# Patient Record
Sex: Male | Born: 1955
Health system: Southern US, Community
[De-identification: ages and names within clinical notes are randomized; demographics above are authoritative.]

## PROBLEM LIST (undated history)

## (undated) DIAGNOSIS — I639 Cerebral infarction, unspecified: Secondary | ICD-10-CM

## (undated) DIAGNOSIS — R7303 Prediabetes: Secondary | ICD-10-CM

## (undated) DIAGNOSIS — R109 Unspecified abdominal pain: Secondary | ICD-10-CM

## (undated) DIAGNOSIS — K219 Gastro-esophageal reflux disease without esophagitis: Secondary | ICD-10-CM

## (undated) DIAGNOSIS — J189 Pneumonia, unspecified organism: Secondary | ICD-10-CM

## (undated) DIAGNOSIS — M1990 Unspecified osteoarthritis, unspecified site: Secondary | ICD-10-CM

## (undated) DIAGNOSIS — R1013 Epigastric pain: Secondary | ICD-10-CM

## (undated) DIAGNOSIS — I10 Essential (primary) hypertension: Secondary | ICD-10-CM

## (undated) DIAGNOSIS — D369 Benign neoplasm, unspecified site: Secondary | ICD-10-CM

## (undated) DIAGNOSIS — E119 Type 2 diabetes mellitus without complications: Secondary | ICD-10-CM

## (undated) DIAGNOSIS — R519 Headache, unspecified: Secondary | ICD-10-CM

## (undated) DIAGNOSIS — J449 Chronic obstructive pulmonary disease, unspecified: Secondary | ICD-10-CM

## (undated) DIAGNOSIS — R51 Headache: Secondary | ICD-10-CM

## (undated) DIAGNOSIS — R079 Chest pain, unspecified: Secondary | ICD-10-CM

## (undated) DIAGNOSIS — Z9289 Personal history of other medical treatment: Secondary | ICD-10-CM

## (undated) HISTORY — DX: Chronic obstructive pulmonary disease, unspecified: J44.9

## (undated) HISTORY — PX: BACK SURGERY: SHX140

## (undated) HISTORY — DX: Epigastric pain: R10.13

## (undated) HISTORY — DX: Unspecified abdominal pain: R10.9

## (undated) HISTORY — DX: Chest pain, unspecified: R07.9

## (undated) HISTORY — DX: Personal history of other medical treatment: Z92.89

---

## 2001-01-31 ENCOUNTER — Emergency Department (HOSPITAL_COMMUNITY): Admission: EM | Admit: 2001-01-31 | Discharge: 2001-01-31 | Payer: Self-pay | Admitting: *Deleted

## 2001-01-31 ENCOUNTER — Encounter: Payer: Self-pay | Admitting: *Deleted

## 2001-05-07 ENCOUNTER — Emergency Department (HOSPITAL_COMMUNITY): Admission: EM | Admit: 2001-05-07 | Discharge: 2001-05-07 | Payer: Self-pay | Admitting: *Deleted

## 2002-01-04 ENCOUNTER — Emergency Department (HOSPITAL_COMMUNITY): Admission: EM | Admit: 2002-01-04 | Discharge: 2002-01-04 | Payer: Self-pay | Admitting: Emergency Medicine

## 2002-05-19 ENCOUNTER — Emergency Department (HOSPITAL_COMMUNITY): Admission: EM | Admit: 2002-05-19 | Discharge: 2002-05-20 | Payer: Self-pay | Admitting: Internal Medicine

## 2002-05-29 ENCOUNTER — Emergency Department (HOSPITAL_COMMUNITY): Admission: EM | Admit: 2002-05-29 | Discharge: 2002-05-29 | Payer: Self-pay | Admitting: Emergency Medicine

## 2002-06-03 ENCOUNTER — Ambulatory Visit (HOSPITAL_COMMUNITY): Admission: RE | Admit: 2002-06-03 | Discharge: 2002-06-03 | Payer: Self-pay | Admitting: Emergency Medicine

## 2002-06-03 ENCOUNTER — Encounter: Payer: Self-pay | Admitting: Emergency Medicine

## 2004-01-18 ENCOUNTER — Emergency Department (HOSPITAL_COMMUNITY): Admission: EM | Admit: 2004-01-18 | Discharge: 2004-01-18 | Payer: Self-pay | Admitting: Emergency Medicine

## 2004-02-17 ENCOUNTER — Emergency Department (HOSPITAL_COMMUNITY): Admission: EM | Admit: 2004-02-17 | Discharge: 2004-02-17 | Payer: Self-pay | Admitting: Emergency Medicine

## 2004-02-25 ENCOUNTER — Ambulatory Visit (HOSPITAL_COMMUNITY): Admission: RE | Admit: 2004-02-25 | Discharge: 2004-02-25 | Payer: Self-pay | Admitting: Orthopaedic Surgery

## 2004-03-11 ENCOUNTER — Encounter (HOSPITAL_COMMUNITY): Admission: RE | Admit: 2004-03-11 | Discharge: 2004-04-10 | Payer: Self-pay | Admitting: Orthopaedic Surgery

## 2004-04-08 ENCOUNTER — Ambulatory Visit (HOSPITAL_COMMUNITY): Admission: RE | Admit: 2004-04-08 | Discharge: 2004-04-09 | Payer: Self-pay | Admitting: Neurosurgery

## 2004-06-07 ENCOUNTER — Encounter (HOSPITAL_COMMUNITY): Admission: RE | Admit: 2004-06-07 | Discharge: 2004-06-11 | Payer: Self-pay | Admitting: Neurosurgery

## 2004-06-15 ENCOUNTER — Emergency Department (HOSPITAL_COMMUNITY): Admission: EM | Admit: 2004-06-15 | Discharge: 2004-06-15 | Payer: Self-pay | Admitting: Emergency Medicine

## 2004-06-22 ENCOUNTER — Ambulatory Visit (HOSPITAL_COMMUNITY): Admission: RE | Admit: 2004-06-22 | Discharge: 2004-06-22 | Payer: Self-pay | Admitting: Neurosurgery

## 2004-07-19 ENCOUNTER — Encounter: Admission: RE | Admit: 2004-07-19 | Discharge: 2004-07-19 | Payer: Self-pay | Admitting: Neurosurgery

## 2004-08-02 ENCOUNTER — Encounter (HOSPITAL_COMMUNITY): Admission: RE | Admit: 2004-08-02 | Discharge: 2004-09-01 | Payer: Self-pay | Admitting: Neurosurgery

## 2004-08-18 ENCOUNTER — Emergency Department (HOSPITAL_COMMUNITY): Admission: EM | Admit: 2004-08-18 | Discharge: 2004-08-18 | Payer: Self-pay | Admitting: Emergency Medicine

## 2004-09-12 HISTORY — PX: ESOPHAGOGASTRODUODENOSCOPY: SHX1529

## 2004-09-29 ENCOUNTER — Ambulatory Visit: Payer: Self-pay | Admitting: Internal Medicine

## 2004-09-30 ENCOUNTER — Ambulatory Visit: Payer: Self-pay | Admitting: Internal Medicine

## 2004-09-30 ENCOUNTER — Ambulatory Visit (HOSPITAL_COMMUNITY): Admission: RE | Admit: 2004-09-30 | Discharge: 2004-09-30 | Payer: Self-pay | Admitting: Internal Medicine

## 2004-11-30 ENCOUNTER — Ambulatory Visit: Payer: Self-pay | Admitting: Internal Medicine

## 2005-01-11 ENCOUNTER — Emergency Department (HOSPITAL_COMMUNITY): Admission: EM | Admit: 2005-01-11 | Discharge: 2005-01-11 | Payer: Self-pay | Admitting: Emergency Medicine

## 2005-06-16 ENCOUNTER — Ambulatory Visit (HOSPITAL_COMMUNITY): Admission: RE | Admit: 2005-06-16 | Discharge: 2005-06-16 | Payer: Self-pay | Admitting: Internal Medicine

## 2006-02-16 ENCOUNTER — Ambulatory Visit (HOSPITAL_COMMUNITY): Admission: RE | Admit: 2006-02-16 | Discharge: 2006-02-16 | Payer: Self-pay | Admitting: Internal Medicine

## 2006-03-22 ENCOUNTER — Emergency Department (HOSPITAL_COMMUNITY): Admission: EM | Admit: 2006-03-22 | Discharge: 2006-03-22 | Payer: Self-pay | Admitting: Emergency Medicine

## 2006-04-26 ENCOUNTER — Emergency Department (HOSPITAL_COMMUNITY): Admission: EM | Admit: 2006-04-26 | Discharge: 2006-04-26 | Payer: Self-pay | Admitting: Emergency Medicine

## 2006-10-08 ENCOUNTER — Emergency Department (HOSPITAL_COMMUNITY): Admission: EM | Admit: 2006-10-08 | Discharge: 2006-10-08 | Payer: Self-pay | Admitting: Emergency Medicine

## 2007-02-17 ENCOUNTER — Emergency Department (HOSPITAL_COMMUNITY): Admission: EM | Admit: 2007-02-17 | Discharge: 2007-02-17 | Payer: Self-pay | Admitting: Emergency Medicine

## 2007-04-12 ENCOUNTER — Ambulatory Visit: Payer: Self-pay | Admitting: Orthopedic Surgery

## 2007-05-02 ENCOUNTER — Encounter: Payer: Self-pay | Admitting: Orthopedic Surgery

## 2007-05-21 ENCOUNTER — Ambulatory Visit: Payer: Self-pay | Admitting: Orthopedic Surgery

## 2007-05-29 ENCOUNTER — Encounter (HOSPITAL_COMMUNITY): Admission: RE | Admit: 2007-05-29 | Discharge: 2007-06-12 | Payer: Self-pay | Admitting: Orthopedic Surgery

## 2007-06-15 ENCOUNTER — Encounter (HOSPITAL_COMMUNITY): Admission: RE | Admit: 2007-06-15 | Discharge: 2007-07-15 | Payer: Self-pay | Admitting: Orthopedic Surgery

## 2007-07-03 ENCOUNTER — Ambulatory Visit: Payer: Self-pay | Admitting: Orthopedic Surgery

## 2007-07-03 DIAGNOSIS — M47812 Spondylosis without myelopathy or radiculopathy, cervical region: Secondary | ICD-10-CM | POA: Insufficient documentation

## 2007-07-03 DIAGNOSIS — M542 Cervicalgia: Secondary | ICD-10-CM

## 2007-07-05 ENCOUNTER — Encounter: Payer: Self-pay | Admitting: Orthopedic Surgery

## 2007-07-10 ENCOUNTER — Emergency Department (HOSPITAL_COMMUNITY): Admission: EM | Admit: 2007-07-10 | Discharge: 2007-07-10 | Payer: Self-pay | Admitting: Emergency Medicine

## 2007-10-02 ENCOUNTER — Ambulatory Visit: Payer: Self-pay | Admitting: Orthopedic Surgery

## 2007-10-12 ENCOUNTER — Telehealth: Payer: Self-pay | Admitting: Orthopedic Surgery

## 2007-10-14 HISTORY — PX: NECK SURGERY: SHX720

## 2007-10-15 ENCOUNTER — Encounter: Payer: Self-pay | Admitting: Orthopedic Surgery

## 2007-10-16 ENCOUNTER — Inpatient Hospital Stay (HOSPITAL_COMMUNITY): Admission: RE | Admit: 2007-10-16 | Discharge: 2007-10-18 | Payer: Self-pay | Admitting: Neurosurgery

## 2007-11-07 ENCOUNTER — Encounter: Payer: Self-pay | Admitting: Orthopedic Surgery

## 2007-12-05 ENCOUNTER — Encounter: Payer: Self-pay | Admitting: Orthopedic Surgery

## 2007-12-17 ENCOUNTER — Encounter: Admission: RE | Admit: 2007-12-17 | Discharge: 2007-12-17 | Payer: Self-pay | Admitting: Neurosurgery

## 2007-12-31 ENCOUNTER — Encounter: Payer: Self-pay | Admitting: Orthopedic Surgery

## 2007-12-31 ENCOUNTER — Telehealth: Payer: Self-pay | Admitting: Orthopedic Surgery

## 2008-01-11 ENCOUNTER — Encounter: Payer: Self-pay | Admitting: Orthopedic Surgery

## 2008-01-17 ENCOUNTER — Observation Stay (HOSPITAL_COMMUNITY): Admission: RE | Admit: 2008-01-17 | Discharge: 2008-01-18 | Payer: Self-pay | Admitting: Neurosurgery

## 2008-01-24 ENCOUNTER — Encounter: Payer: Self-pay | Admitting: Orthopedic Surgery

## 2008-01-27 ENCOUNTER — Encounter: Admission: RE | Admit: 2008-01-27 | Discharge: 2008-01-27 | Payer: Self-pay | Admitting: Neurosurgery

## 2008-01-31 ENCOUNTER — Inpatient Hospital Stay (HOSPITAL_COMMUNITY): Admission: RE | Admit: 2008-01-31 | Discharge: 2008-02-02 | Payer: Self-pay | Admitting: Neurosurgery

## 2008-02-19 ENCOUNTER — Emergency Department (HOSPITAL_COMMUNITY): Admission: EM | Admit: 2008-02-19 | Discharge: 2008-02-19 | Payer: Self-pay | Admitting: Emergency Medicine

## 2008-03-05 ENCOUNTER — Telehealth: Payer: Self-pay | Admitting: Orthopedic Surgery

## 2008-03-05 ENCOUNTER — Encounter: Payer: Self-pay | Admitting: Orthopedic Surgery

## 2008-03-26 ENCOUNTER — Encounter: Payer: Self-pay | Admitting: Orthopedic Surgery

## 2008-05-02 ENCOUNTER — Encounter: Payer: Self-pay | Admitting: Orthopedic Surgery

## 2008-08-01 ENCOUNTER — Encounter: Payer: Self-pay | Admitting: Orthopedic Surgery

## 2008-10-10 ENCOUNTER — Ambulatory Visit (HOSPITAL_COMMUNITY): Admission: RE | Admit: 2008-10-10 | Discharge: 2008-10-10 | Payer: Self-pay | Admitting: Internal Medicine

## 2008-11-14 ENCOUNTER — Emergency Department (HOSPITAL_COMMUNITY): Admission: EM | Admit: 2008-11-14 | Discharge: 2008-11-14 | Payer: Self-pay | Admitting: Emergency Medicine

## 2009-01-08 ENCOUNTER — Emergency Department (HOSPITAL_COMMUNITY): Admission: EM | Admit: 2009-01-08 | Discharge: 2009-01-08 | Payer: Self-pay | Admitting: Emergency Medicine

## 2009-01-11 ENCOUNTER — Emergency Department (HOSPITAL_COMMUNITY): Admission: EM | Admit: 2009-01-11 | Discharge: 2009-01-11 | Payer: Self-pay | Admitting: Emergency Medicine

## 2009-02-03 ENCOUNTER — Encounter: Payer: Self-pay | Admitting: Orthopedic Surgery

## 2009-04-11 ENCOUNTER — Emergency Department (HOSPITAL_COMMUNITY): Admission: EM | Admit: 2009-04-11 | Discharge: 2009-04-11 | Payer: Self-pay | Admitting: Emergency Medicine

## 2009-04-15 ENCOUNTER — Encounter (INDEPENDENT_AMBULATORY_CARE_PROVIDER_SITE_OTHER): Payer: Self-pay | Admitting: *Deleted

## 2009-04-16 ENCOUNTER — Encounter (INDEPENDENT_AMBULATORY_CARE_PROVIDER_SITE_OTHER): Payer: Self-pay | Admitting: *Deleted

## 2009-05-11 DIAGNOSIS — K219 Gastro-esophageal reflux disease without esophagitis: Secondary | ICD-10-CM

## 2009-05-11 DIAGNOSIS — F1010 Alcohol abuse, uncomplicated: Secondary | ICD-10-CM | POA: Insufficient documentation

## 2009-05-11 DIAGNOSIS — F17200 Nicotine dependence, unspecified, uncomplicated: Secondary | ICD-10-CM

## 2009-05-11 DIAGNOSIS — R197 Diarrhea, unspecified: Secondary | ICD-10-CM

## 2009-05-12 ENCOUNTER — Ambulatory Visit: Payer: Self-pay | Admitting: Internal Medicine

## 2009-05-12 DIAGNOSIS — R1032 Left lower quadrant pain: Secondary | ICD-10-CM

## 2009-05-12 DIAGNOSIS — K5289 Other specified noninfective gastroenteritis and colitis: Secondary | ICD-10-CM

## 2009-05-12 DIAGNOSIS — Z8711 Personal history of peptic ulcer disease: Secondary | ICD-10-CM

## 2009-05-22 ENCOUNTER — Ambulatory Visit: Payer: Self-pay | Admitting: Internal Medicine

## 2009-05-22 ENCOUNTER — Ambulatory Visit (HOSPITAL_COMMUNITY): Admission: RE | Admit: 2009-05-22 | Discharge: 2009-05-22 | Payer: Self-pay | Admitting: Internal Medicine

## 2009-05-22 ENCOUNTER — Encounter: Payer: Self-pay | Admitting: Internal Medicine

## 2009-05-26 ENCOUNTER — Encounter: Payer: Self-pay | Admitting: Internal Medicine

## 2009-10-20 ENCOUNTER — Emergency Department (HOSPITAL_COMMUNITY): Admission: EM | Admit: 2009-10-20 | Discharge: 2009-10-20 | Payer: Self-pay | Admitting: Emergency Medicine

## 2009-12-08 ENCOUNTER — Emergency Department (HOSPITAL_COMMUNITY): Admission: EM | Admit: 2009-12-08 | Discharge: 2009-12-08 | Payer: Self-pay | Admitting: Emergency Medicine

## 2010-02-15 ENCOUNTER — Emergency Department (HOSPITAL_COMMUNITY): Admission: EM | Admit: 2010-02-15 | Discharge: 2010-02-15 | Payer: Self-pay | Admitting: Emergency Medicine

## 2010-04-18 ENCOUNTER — Emergency Department (HOSPITAL_COMMUNITY): Admission: EM | Admit: 2010-04-18 | Discharge: 2010-04-18 | Payer: Self-pay | Admitting: Emergency Medicine

## 2010-06-10 ENCOUNTER — Inpatient Hospital Stay (HOSPITAL_COMMUNITY): Admission: EM | Admit: 2010-06-10 | Discharge: 2010-06-11 | Payer: Self-pay | Admitting: Emergency Medicine

## 2010-10-09 ENCOUNTER — Emergency Department (HOSPITAL_COMMUNITY)
Admission: EM | Admit: 2010-10-09 | Discharge: 2010-10-09 | Payer: Self-pay | Source: Home / Self Care | Admitting: Emergency Medicine

## 2010-10-09 LAB — DIFFERENTIAL
Eosinophils Absolute: 0 10*3/uL (ref 0.0–0.7)
Eosinophils Relative: 0 % (ref 0–5)
Monocytes Absolute: 0.8 10*3/uL (ref 0.1–1.0)
Monocytes Relative: 8 % (ref 3–12)
Neutro Abs: 6.2 10*3/uL (ref 1.7–7.7)

## 2010-10-09 LAB — URINALYSIS, ROUTINE W REFLEX MICROSCOPIC
Leukocytes, UA: NEGATIVE
Nitrite: NEGATIVE
Specific Gravity, Urine: >1.03 — ABNORMAL HIGH (ref 1.005–1.030)

## 2010-10-09 LAB — CBC
HCT: 49.5 % (ref 39.0–52.0)
Hemoglobin: 18.7 g/dL — ABNORMAL HIGH (ref 13.0–17.0)
MCHC: 37.8 g/dL — ABNORMAL HIGH (ref 30.0–36.0)
MCV: 86.1 fL (ref 78.0–100.0)
RBC: 5.75 MIL/uL (ref 4.22–5.81)
RDW: 13.6 % (ref 11.5–15.5)

## 2010-10-09 LAB — COMPREHENSIVE METABOLIC PANEL
ALT: 25 U/L (ref 0–53)
Alkaline Phosphatase: 122 U/L — ABNORMAL HIGH (ref 39–117)
BUN: 18 mg/dL (ref 6–23)
Creatinine, Ser: 1.17 mg/dL (ref 0.4–1.5)
Sodium: 133 mEq/L — ABNORMAL LOW (ref 135–145)
Total Bilirubin: 0.4 mg/dL (ref 0.3–1.2)

## 2010-10-09 LAB — URINE MICROSCOPIC-ADD ON

## 2010-10-13 HISTORY — PX: COLONOSCOPY: SHX174

## 2010-10-22 ENCOUNTER — Encounter: Payer: Self-pay | Admitting: Internal Medicine

## 2010-10-27 ENCOUNTER — Ambulatory Visit (HOSPITAL_COMMUNITY)
Admission: RE | Admit: 2010-10-27 | Discharge: 2010-10-27 | Disposition: A | Discharge status: Home / Self Care | Payer: Medicare Other | Source: Ambulatory Visit | Attending: Internal Medicine | Admitting: Internal Medicine

## 2010-10-27 ENCOUNTER — Encounter: Payer: Medicare Other | Admitting: Internal Medicine

## 2010-10-27 DIAGNOSIS — Z860100 Personal history of colon polyps, unspecified: Secondary | ICD-10-CM | POA: Insufficient documentation

## 2010-10-27 DIAGNOSIS — D126 Benign neoplasm of colon, unspecified: Secondary | ICD-10-CM | POA: Insufficient documentation

## 2010-10-27 DIAGNOSIS — Z09 Encounter for follow-up examination after completed treatment for conditions other than malignant neoplasm: Secondary | ICD-10-CM

## 2010-10-28 NOTE — Letter (Signed)
Summary: [facility] TRIAGE  [facility] TRIAGE   Imported By: Hoy Morn 10/22/2010 11:54:56  _____________________________________________________________________  External Attachment:    Type:   Image     Comment:   External Document  Appended Document: [facility] TRIAGE ok as is  Appended Document: [facility] TRIAGE MAILED INSTRUCTIONS TO PATIENT

## 2010-11-02 NOTE — Op Note (Signed)
  [name], [name]                   ACCOUNT NO.:  [id number]  MEDICAL RECORD NO.:  [id number]           PATIENT TYPE:  O  LOCATION:  DAYP                          FACILITY:  [facility]  PHYSICIAN:  R. Garfield Cornea, M.D. DATE OF BIRTH:  November 21, 1955  DATE OF PROCEDURE:  10/27/2010 DATE OF DISCHARGE:                              OPERATIVE REPORT   PROCEDURE:  Colonoscopy with snare polypectomy.  INDICATIONS FOR PROCEDURE:  Patient is a 55 year old gentleman who had multiple polyps removed from colon in 2002.  They were adenomatous and tubulovillous polyps.  He has no lower GI tract symptoms.  Currently, he now comes for surveillance examination.  Risks, benefits, alternatives, limitations, imponderables have been reviewed.  Please see the documentation in the medical record.  PROCEDURE NOTE:  O2 saturation, blood pressure, pulse, respirations were monitored throughout the entire procedure.  CONSCIOUS SEDATION:  Versed 6 mg IV, Demerol 100 mg IV in divided doses.  INSTRUMENT:  Pentax video chip system.  FINDINGS:  Digital rectal exam revealed no abnormalities.  Endoscopic findings:  Prep was good.  Colon:  Colonic mucosa was surveyed from the rectosigmoid junction through the left transverse right colon to the appendiceal orifice, ileocecal valve/cecum.  These structures were well seen and photographed for record.  From this level, scope was slowly and cautiously withdrawn.  All previously mentioned mucosal surfaces were again seen.  The patient had a 4-mm polyp on a stalk just distal to the ileocecal valve, it was cold snared, however, it was not recovered after some search including straining and the suction of bowel.  The remainder of colonic mucosa appeared normal.  Scope was pulled down to the rectum where a thorough examination of rectal mucosa including retroflexed view of the anal verge demonstrated no abnormalities.  The patient tolerated the procedure very well.  Cecal  withdrawal time 9 minutes.  IMPRESSION:  Normal rectum, pedunculated polyp just distal ileocecal valve resected via snare technique, but not recovered.  Remainder of colonic mucosa appeared normal.  RECOMMENDATIONS:  Recommended [name] to return in 5 years for surveillance colonoscopy.     Bridgette Habermann, M.D.     RMR/MEDQ  D:  10/27/2010  T:  10/27/2010  Job:  [id number]  cc:   Tesfaye D. Legrand Rams, MD Fax: [phone]  Electronically Signed by Jannette Spanner M.D. on 11/02/2010 02:34:51 PM

## 2010-11-25 LAB — CBC
Hemoglobin: 16.4 g/dL (ref 13.0–17.0)
MCH: 31.3 pg (ref 26.0–34.0)
MCV: 92 fL (ref 78.0–100.0)
Platelets: 226 10*3/uL (ref 150–400)
RBC: 5.24 MIL/uL (ref 4.22–5.81)
WBC: 6.4 10*3/uL (ref 4.0–10.5)

## 2010-11-25 LAB — POCT CARDIAC MARKERS
CKMB, poc: <1 ng/mL — ABNORMAL LOW (ref 1.0–8.0)
Myoglobin, poc: 27.7 ng/mL (ref 12–200)
Troponin i, poc: <0.05 ng/mL (ref 0.00–0.09)

## 2010-11-25 LAB — DIFFERENTIAL
Eosinophils Absolute: 0.1 10*3/uL (ref 0.0–0.7)
Eosinophils Relative: 1 % (ref 0–5)
Lymphocytes Relative: 26 % (ref 12–46)
Lymphs Abs: 1.7 10*3/uL (ref 0.7–4.0)
Monocytes Relative: 8 % (ref 3–12)
Neutrophils Relative %: 63 % (ref 43–77)

## 2010-11-25 LAB — BRAIN NATRIURETIC PEPTIDE: Pro B Natriuretic peptide (BNP): <30 pg/mL (ref 0.0–100.0)

## 2010-11-25 LAB — BASIC METABOLIC PANEL
CO2: 24 mEq/L (ref 19–32)
Chloride: 111 mEq/L (ref 96–112)
Creatinine, Ser: 1.05 mg/dL (ref 0.4–1.5)
GFR calc Af Amer: >60 mL/min (ref >60)
Sodium: 138 mEq/L (ref 135–145)

## 2010-11-25 LAB — D-DIMER, QUANTITATIVE: D-Dimer, Quant: 0.28 ug/mL-FEU (ref 0.00–0.48)

## 2010-11-29 LAB — DIFFERENTIAL
Basophils Absolute: 0 10*3/uL (ref 0.0–0.1)
Eosinophils Relative: 1 % (ref 0–5)
Lymphocytes Relative: 22 % (ref 12–46)
Neutro Abs: 4.6 10*3/uL (ref 1.7–7.7)
Neutrophils Relative %: 67 % (ref 43–77)

## 2010-11-29 LAB — URINALYSIS, ROUTINE W REFLEX MICROSCOPIC
Bilirubin Urine: NEGATIVE
Glucose, UA: NEGATIVE mg/dL
Hgb urine dipstick: NEGATIVE
Specific Gravity, Urine: 1.005 (ref 1.005–1.030)

## 2010-11-29 LAB — COMPREHENSIVE METABOLIC PANEL
AST: 27 U/L (ref 0–37)
BUN: 7 mg/dL (ref 6–23)
CO2: 18 mEq/L — ABNORMAL LOW (ref 19–32)
Chloride: 110 mEq/L (ref 96–112)
Creatinine, Ser: 0.98 mg/dL (ref 0.4–1.5)
GFR calc non Af Amer: >60 mL/min (ref >60)
Glucose, Bld: 90 mg/dL (ref 70–99)
Total Bilirubin: 0.6 mg/dL (ref 0.3–1.2)

## 2010-11-29 LAB — CBC
HCT: 50.8 % (ref 39.0–52.0)
Hemoglobin: 17.2 g/dL — ABNORMAL HIGH (ref 13.0–17.0)
MCHC: 33.8 g/dL (ref 30.0–36.0)
MCV: 92.1 fL (ref 78.0–100.0)
RBC: 5.52 MIL/uL (ref 4.22–5.81)
WBC: 6.9 10*3/uL (ref 4.0–10.5)

## 2010-12-03 LAB — POCT CARDIAC MARKERS
CKMB, poc: 1.6 ng/mL (ref 1.0–8.0)
Troponin i, poc: <0.05 ng/mL (ref 0.00–0.09)

## 2010-12-03 LAB — BASIC METABOLIC PANEL
Calcium: 9.5 mg/dL (ref 8.4–10.5)
Chloride: 111 mEq/L (ref 96–112)
Creatinine, Ser: 1.64 mg/dL — ABNORMAL HIGH (ref 0.4–1.5)
GFR calc Af Amer: 53 mL/min — ABNORMAL LOW (ref >60)
Sodium: 140 mEq/L (ref 135–145)

## 2010-12-03 LAB — D-DIMER, QUANTITATIVE: D-Dimer, Quant: 0.31 ug/mL-FEU (ref 0.00–0.48)

## 2010-12-03 LAB — CBC
Hemoglobin: 18.9 g/dL — ABNORMAL HIGH (ref 13.0–17.0)
MCV: 89.8 fL (ref 78.0–100.0)
RBC: 6.03 MIL/uL — ABNORMAL HIGH (ref 4.22–5.81)
WBC: 16.5 10*3/uL — ABNORMAL HIGH (ref 4.0–10.5)

## 2010-12-03 LAB — DIFFERENTIAL
Lymphs Abs: 2.4 10*3/uL (ref 0.7–4.0)
Monocytes Relative: 8 % (ref 3–12)
Neutro Abs: 12.6 10*3/uL — ABNORMAL HIGH (ref 1.7–7.7)
Neutrophils Relative %: 77 % (ref 43–77)

## 2010-12-17 LAB — OVA AND PARASITE EXAMINATION: Ova and parasites: NONE SEEN

## 2010-12-17 LAB — STOOL CULTURE

## 2010-12-21 LAB — POCT I-STAT, CHEM 8
Glucose, Bld: 82 mg/dL (ref 70–99)
HCT: 50 % (ref 39.0–52.0)
Hemoglobin: 17 g/dL (ref 13.0–17.0)
Potassium: 4.1 mEq/L (ref 3.5–5.1)
Sodium: 139 mEq/L (ref 135–145)

## 2010-12-21 LAB — POCT CARDIAC MARKERS
CKMB, poc: 2.1 ng/mL (ref 1.0–8.0)
Myoglobin, poc: 83.6 ng/mL (ref 12–200)

## 2011-01-25 NOTE — Discharge Summary (Signed)
[name], [name]                   ACCOUNT NO.:  [id number]   MEDICAL RECORD NO.:  [id number]          PATIENT TYPE:  OIB   LOCATION:  3007                         FACILITY:  [facility]   PHYSICIAN:  Marchia Meiers. Vertell Limber, M.D.  DATE OF BIRTH:  09/04/1956   DATE OF ADMISSION:  01/31/2008  DATE OF DISCHARGE:  02/02/2008                               DISCHARGE SUMMARY   REASON FOR ADMISSION:  Recurrent disk herniation with facet fracture.   FINAL DIAGNOSIS:  Recurrent disk herniation with facet fracture.   HISTORY AND HOSPITAL COURSE:  Bryan [name] is a 55 year old male who had  undergone lumbar diskectomy.  He did well with the surgeries and had  recurrence of pain.  He was found to have very large recurrent disk  herniation.  He was taken back to the surgery where he was found to have  a fractured facet and underwent unilateral pedicle screw fixation at the  operated level with interbody grafting.  He tolerated the procedure well  and gradually mobilized, and was doing well on Feb 02, 2008 and was  discharged home in stable and satisfactory condition.   DISCHARGE MEDICATIONS:  Include Vicodin Extra Strength and Flexeril.  He  is instructed to follow up with Dr. Luiz Ochoa in 3 weeks in the office.      Marchia Meiers. Vertell Limber, M.D.  Electronically Signed     JDS/MEDQ  D:  02/02/2008  T:  02/02/2008  Job:  [id number]

## 2011-01-25 NOTE — Op Note (Signed)
NAMEUZZIAH, RIGG NO.:  [id number]   MEDICAL RECORD NO.:  [id number]          PATIENT TYPE:  OIB   LOCATION:  3007                         FACILITY:  [facility]   PHYSICIAN:  Otilio Connors, M.D.  DATE OF BIRTH:  [birth year]   DATE OF PROCEDURE:  01/31/2008  DATE OF DISCHARGE:                               OPERATIVE REPORT   PREOPERATIVE DIAGNOSIS:  Recurrent herniated nucleus pulposus stenosis,  L3-L4.   POSTOPERATIVE DIAGNOSES:  1. Recurrent herniated nucleus pulposus stenosis, L3-L4.  2. Left L3-L4 facet fracture.   PROCEDURES:  Redo decompressive laminectomy at L3 and L4 (two levels),  posterior lumbar interbody fusion, L3-L4; Saber interbody cage at L3-L4;  nonsegmented Expedium pedicle screw fixation at L3-L4; posterolateral  fusion, L3-L4; autograft same incision; INFUSE bone morphogenetic  protein; and microdissection with microscope.   SURGEON:  Otilio Connors, MD   ASSISTANT:  Hosie Spangle, MD   ANESTHESIA:  General endotracheal tube anesthesia.   ESTIMATED BLOOD LOSS:  Minimal.   BLOOD GIVEN:  None.   DRAINS:  None.   COMPLICATIONS:  None.   REASON FOR PROCEDURE:  The patient is a 55 year old gentleman who  underwent a decompressive laminectomy diskectomy at L3-L4 and L4-L5, 12  days ago.  He was doing great in resolution of his leg pain for the  first 4 or 5 days and all of sudden he had a severe back and left leg  pain, some burning pain into his shin area.  It shows like a lot of  symptoms he was having prior to surgery but worse and steroids did not  help things.  The pain was severe and an MRI was done showing large  recurrent disk herniation.  The patient brought back in surgery for redo  laminectomy.   PROCEDURE IN DETAIL:  The patient was brought to the operating room and  general anesthesia was induced.  The patient was placed in a prone  position on a Wilson frame with all pressure points padded.  The patient  was prepped  and draped in sterile fashion.  A standard incision was  injected with 10 mL of 1% lidocaine with epinephrine.  Incision was then  made at the site of previous incision.  Incision was opened up.  Sutures  were cut and incision opened up with fairly easily, and we could see  where the surgery was previously.  Markers were placed at two  interspaces that were seen and x-rays show we were at L2-L3 and L3-L4  which we thought.  We then started working at L3-L4 spaces.  We explored  the laminotomy defect and started to remove some scar tissue.  We  noticed a facet was fractured, L3 inferior facet was fractured and there  was instability there.  A piece of bone just lifted off the facet  leaving a gap between the pars and  the L4 superior facet.  We did a  decompressive laminectomy decompressing the L3 and L4 roots and found  huge free fragments of disk up under the dura and for root that  were  removed, and this decompressed the canal.  We explored disk space, found  a large hole in the annulus centrally.  Disk space was incised and  diskectomy was performed with pituitary rongeurs and curettes.  It was  decided because of the facet fracture and the instability there to go  ahead do the fusion at the L3-L4 level.  We already prepared the  interspace for interbody fusion with the broaches for the Saber system.  We distracted the interspace up to 11 mm, and we packed a Saber cage  with INFUSE BMP and then all of bone that was removed during the  laminectomy and the fracture.  The facet was chopped into small pieces.  This bone was packed in the cage 2 and then the bone was packed into the  interspace.  Cage was then tapped into the interspace, and we tried to  get it towards the midline and we got just to about the midline.  Cage  was in good position, has held open the interspace well, and we again  explored the central canal and nerve roots and both L3 -L4 roots were  well decompressed.  We  decorticated lateral facets pars in transverse  processes of L3-L4.  We placed INFUSE BMP in the posterolateral gutters,  and we found the pack entry points for L3-L4 using intraoperative  landmarks and fluoroscopy, decorticated the area, placed a probe down  the pedicle, tapped the pedicle, checked the pedicle hole with a small  probing ensuring that we had good bony circumference, and then placed an  Expedium pedicle screw.  This was in the left side.  A 50 mm screw  x 6  wide was placed in L3 and a 45-mm screw placed at L4.  The rod was  placed into the screw heads and locking nuts placed in and these were  finally tightened down.  First, the INFUSE BMP was packed to the  posterolateral gutters following with the freshly autograft bone and  Vitoss sponge  for posterolateral fusion, L3-L4.  Of note, all of these  were done unilaterally from the left side.  We did not open up and  expose the right side of the spine at all.  Fluoroscopy showed good  position of pedicle screws, interbody cage, and good alignment of the L3-  L4 space.  Again explored the nerve roots, we had good decompression,  and the fascia was closed with 0 Vicryl interrupted sutures.  Subcutaneous tissue closed with 0-2 and 3-0 Vicryl interrupted sutures.  Skin closed with benzoin and Steri-Strips.  Dressing was placed.  The  patient was placed back in a supine position and awoken from anesthesia  and transferred to recovery room in stable condition.           ______________________________  Otilio Connors, M.D.     JRH/MEDQ  D:  01/31/2008  T:  02/01/2008  Job:  [id number]

## 2011-01-25 NOTE — Op Note (Signed)
NAMETYRIC, RODEHEAVER NO.:  [id number]   MEDICAL RECORD NO.:  [id number]          PATIENT TYPE:  OBV   LOCATION:  70                         FACILITY:  [facility]   PHYSICIAN:  Otilio Connors, M.D.  DATE OF BIRTH:  1956-06-16   DATE OF PROCEDURE:  01/17/2008  DATE OF DISCHARGE:  01/18/2008                               OPERATIVE REPORT   PREOPERATIVE DIAGNOSES:  1. Lumbar spondylosis/stenosis.  2. Foraminal stenosis.  3. Herniated nucleus pulposus, required a surgery with left-sided      radiculopathy at L3-4 and L4-5.   POSTOPERATIVE DIAGNOSES:  1. Lumbar spondylosis/stenosis.  2. Foraminal stenosis.  3. Herniated nucleus pulposus, required a surgery with left-sided      radiculopathy at L3-4 and L4-5.   PROCEDURE:  Redo laminectomy, decompression of the left L3, L4, and L5  roots (3 levels), and microdiskectomy with a microscope.   SURGEON:  Otilio Connors, MD   ASSISTANT:  Ashok Pall, MD   ANESTHESIA:  General endotracheal tube anesthesia.   ESTIMATED BLOOD LOSS:  Minimal.   BLOOD GIVEN:  None.   DRAINS:  None.   COMPLICATIONS:  None.   REASON FOR PROCEDURE:  The patient is a 55 year old gentleman who has  been having back and left leg pain and numbness with some  hypersensitivity and tingling in the left L4 distribution and positive  straight leg raise on the left, motor strength intact.  MRI was done  showing prior to surgery, left L4-5 with possible recurrent disk  herniations, severe spinal ligament change,  foraminal narrowing and  lateral recess stenosis in L3-4 that was still unchanged on recess  stenosis and a central disk herniation and the next free fragment going  caudally.  The patient was brought for decompression.   PROCEDURE IN DETAIL:  The patient was brought into the operating room  and general anesthesia was induced.  The patient was placed in a prone  position on a Wilson frame with all pressure points padded.  The  patient  was prepped and draped in sterile fashion.  Site of incision  injected  with 10 mL of 1% lidocaine with epinephrine.  Incision was then made at  the site of previous scar and extended cephalad, slightly the incision  was taken down the fascia.  Hemostasis was obtained by Bovie  cauterization.  A subperiosteal dissection was done over the 3 spinous  processes and 2 markers were placed and the interspaces were exposed.  X-  ray was obtained showing this in the L2-3 and L3-4 space.  We extended  our incision slightly caudally, dissected down L4-5 interspace with  subperiosteal dissection out to facets; put self-retaining retractor  in  and placed the markers at the L3-4 and L4-5 interspaces, and took  another x-ray confirming our positioning.  We used Kerrison punch to  remove some of the scar in L5.  Marland Kitchen  There was even a little scar in the  interspace at L3-4.  High-speed drill was used to decompress semi-  hemilaminectomy and medial facetectomy at both levels.  Microscope  was  brought for microdissection and after that Kerrison punches were used to  complete the compression __________  both at the L3-4 and L4-5 levels.  At L4-5, we explored the epidural spaceand planned on working our way  through the scar which was difficult but carefully with careful  dissection in order to get down onto the scar to the disk space, careful  dissectingout, the nerve root, loosening up the nerve with a  hypertrophic pedicle.  Then we could sweep under the nerve root until we  found somesmall fragments of disk and scar, this was removed for the  decompression of the nerve.  We were able to move it over medially with  a nerve retractor.  We found the interspace, incised the disk space at  diskectomy.  We continued the dissection of cephalad and found a very  tight foramen and with pressure into the 3 roots, continued to drill and  Kerrison punches to do a foraminotomy and to further decompress.  We  got  3 roots.  Doing it fast, we had good decompression in central canal in  the L3 and L4 roots.  Attention was then turned to the L3-4 level.  We  then explored the epidural space, we found disk space and below the disk  space we found large fragment of disk.  We were able to remove that with  some work using a hook to pull  off fragments.  A foraminotomy with 3  roots over the 4-root levels below were actually the  L4 and L5 roots  and a spoke at the L4-5 level and the levels we were working on were the  L3 and L4 levels.  These roots were decompressed.  Foraminotomies were  performed anteriorly and  showed a good decompression.  The __________  ligaments were removed.  When we were finished, we explored both areas.  At this time, we had good decompression of the central canal and the L3,  L4, and L5 roots.  Hemostasis was obtained with Gelfoam and thrombin.  This was then irrigated out.  We irrigated with antibiotic solution with  good hemostasis.  Retractors were removed.  Fascia was closed with 0  Vicryl interrupted sutures, subcutaneous tissue closed with 2-0 Vicryl  interrupted sutures, and the skin was closed with benzoin and Steri-  Strips.  A dressing was placed.  The patient was placed back in the  supine position, awoken from anesthesia, and transferred to recovery  room in stable condition.           ______________________________  Otilio Connors, M.D.     JRH/MEDQ  D:  01/17/2008  T:  01/18/2008  Job:  [id number]

## 2011-01-25 NOTE — Op Note (Signed)
Bryan [name], Bryan [name]                   ACCOUNT NO.:  [id number]   MEDICAL RECORD NO.:  [id number]          PATIENT TYPE:  INP   LOCATION:  3004                         FACILITY:  [facility]   PHYSICIAN:  Otilio Connors, M.D.  DATE OF BIRTH:  09/09/1956   DATE OF PROCEDURE:  10/16/2007  DATE OF DISCHARGE:                               OPERATIVE REPORT   PREOPERATIVE DIAGNOSIS:  Herniated nucleus pulposus, spondylosis,  stenosis with myelopathy and radiculopathy C4-5, C5-6 and C6-7.   POSTOPERATIVE DIAGNOSIS:  Herniated nucleus pulposus, spondylosis,  stenosis with myelopathy and radiculopathy C4-5, C5-6 and C6-7.   PROCEDURE:  Anterior cervical decompression and diskectomy and fusion at  C4-5, C5-6, C6-7 with LifeNet allograft bone, Trestle anterior cervical  plates, JP drain placement.   SURGEON:  Otilio Connors, M.D.   ASSISTANT:  Leeroy Cha, M.D.   ANESTHESIA:  General endotracheal tube.   ESTIMATED BLOOD LOSS:  Minimal.   BLOOD GIVEN:  None.   COMPLICATIONS:  None.   REASON FOR PROCEDURE:  The patient is a 55 year old gentleman who has  been having neck, right shoulder pain and the right arm pain and  numbness, burning disesthesias in his right arm.  He has known rotator  cuff tear on the right but MRI C spine shows severe spondylitic change,  with foraminal narrowing and central stenosis at 4-5, right greater than  left foraminal compression at 5-6 with the spinal cord compression also  from disk herniation and bifemoral narrowing, right worse than left at 6-  7.  The patient brought in for decompression and fusion.   PROCEDURE IN DETAIL:  The patient was brought to the operating room.  General anesthesia was induced.  The patient was placed in 10 pounds  halter traction and prepped and draped in a sterile fashion.  Site  incision was injected 10 mL of 1% lidocaine with epinephrine.  Incision  was then made from the midline to the anterior border of the  sternocleidomastoid muscle on the left side neck, incision taken down to  the platysma and hemostasis obtained with Bovie cauterization.  The  Bovie was used to open platysma and blunt dissection was taken through  the anterior cervical fascia to the anterior cervical spine, needle was  placed interspace.  X-rays obtained showing this the 5-6 interspace.  Disk space was incised with the 15 blade and partial diskectomy  performed with pituitary rongeurs at the needle was removed.  Longus  coli muscle was reflected laterally from C4-C7 and a self-retaining  retractor was placed so we could see the 4-5 and 5-6 disk.  Disk spaces  were again incised 15 blade diskectomy continued with pituitary rongeurs  and curettes.  Anterior osteophytes removed with [place]ell rongeurs and  distraction pins placed and the C4 and C6 interspaces distracted.  Microscope was brought in for microdissection this point and curettes  and then 1 and 2 mm Kerrison punches were used to continue the  diskectomy removing posterior disk, posterior osteophytes and posterior  ligament, decompressing the central canal.  Bilateral foraminotomies  were performed decompressing the  5 nerve roots with special attention to  the right side.  At the 5-6 level same thing was done, decompressing the  central canal and performed bilateral foraminotomies especially to the  right side again and there was disk fragments compressing the C6 roots.  This was removed and we decompressed both 6 roots along with the central  canal and spinal cord.  High-speed drill was used to remove  cartilaginous endplate.  We measured height of disk space to be 5 mm.  Hemostasis with Gelfoam and thrombin.  This was irrigated out and two 5  mm LifeNet allograft bones were tapped into place, countersunk a couple  millimeters at both the 4-5 and 5-6 levels.  We checked posterior to the  graft with a nerve hook and there was plenty of room between bone graft  and  dura.  We irrigated with antibiotic solution removed the distraction  and curettes, placed distraction pin C4 moved our retractor down to see  the 6-7 level, incised the disk space at 6-7, started diskectomy  pituitary rongeurs and curettes placed distraction pin the C7 and we  already had the one at C6.  We distracted the interspace.  Diskectomy  was continued with curettes and 1 and 2 mm Kerrison punches removing  posterior osteophytes and disk in the ligament decompressing the central  canal and performing bilateral foraminotomies over the 7 roots.  When we  were finished we had good decompression.  We removed cartilaginous  endplate with a high-speed drill, measured the height of the disk space  and then tapped the 5 mm LifeNet allograft bone into the 6-7 space.  Distraction pins were removed, weight was removed from traction.  Hemostasis obtained at pin holes with Gelfoam and thrombin.  We  irrigated antibiotic  solution and Trestle anterior cervical plate was  placed over the anterior cervical spine and two screws placed the C4,  two in C5, two in C6, and two in C7.  These were tightened down.  Lateral x-rays obtained showing good position of plate and screws and  the bone plugs.  We irrigated antibiotic solution, got good hemostasis.  Very slight ooze from 4-5 and 5-6 interspaces.  We continued using the  Gelfoam thrombin and then irrigation to get to good hemostasis.  A JP  drain was placed through separate stab wound incision.  Then platysma  was closed with 3-0 Vicryl interrupted suture.  Subcutaneous tissue  closed with same.  Skin closed with benzoin, Steri-Strips.  Drain was  sutured in with 3-0 nylon suture.  Dressing was placed.  The patient was  placed into a soft cervical woken from anesthesia and transferred  recovery room in stable condition.           ______________________________  Otilio Connors, M.D.     JRH/MEDQ  D:  10/16/2007  T:  10/17/2007  Job:   [id number]

## 2011-01-28 NOTE — Op Note (Signed)
NAMEDONTERIUS, FILLEY                             ACCOUNT NO.:  [id number]   MEDICAL RECORD NO.:  [id number]                   PATIENT TYPE:  OIB   LOCATION:  2899                                 FACILITY:  [facility]   PHYSICIAN:  Otilio Connors, M.D.               DATE OF BIRTH:  1956-03-13   DATE OF PROCEDURE:  04/08/2004  DATE OF DISCHARGE:                                 OPERATIVE REPORT   PREOPERATIVE DIAGNOSIS:  Left L4-5 lateral recess stenosis, spondylosis and  disk herniation with left L5 radiculopathy.   POSTOPERATIVE DIAGNOSIS:  Left L4-5 lateral recess stenosis, spondylosis and  disk herniation with left L5 radiculopathy.   OPERATION/PROCEDURE:  1. Left L4-5 decompressive laminectomy with diskectomy.  2. Microdissection with microscope.   SURGEON:  Otilio Connors, M.D.   ASSISTANT:  Ophelia Charter, M.D.   ANESTHESIA:  General endotracheal anesthesia.   ESTIMATED BLOOD LOSS:  Minimal.   BLOOD GIVEN:  None.   DRAINS:  None.   COMPLICATIONS:  None.   REASON FOR PROCEDURE:  The patient is a 55 year old gentleman who has been  having back and leg pain.  __________ has helped his back pain a little bit,  but still has pain radiating down to the top of his foot with some numbness.  MRI was done showing serious changes at the level with lateral recess  stenosis and disk protrusion causing compression of the 5 root.  Patient  brought in for a decompression.   DESCRIPTION OF PROCEDURE:  The patient was brought to the operating room and  general anesthesia induced.  The patient was placed in the prone position  and all pressure points were padded.  The patient was prepped and draped in  the sterile fashion.  The site of the incision was injected with 10 mL of 1%  lidocaine with epinephrine.  Needle was placed in the interspace and x-ray  was obtained showing the L3-4 interspace.  Incision was then made and  centered just below where the needle was placed, and incision  taken down to  the fascia and hemostasis obtained with the Bovie cauterization.  Fascia was  incised and left side subperiosteal dissection was done at the L4-5  __________ after the facet.  A marker was placed in the interspace and  another x-ray was obtained confirming our positioning.  High-speed was drill  was used to start the semi-hemilaminectomy and medial facetectomy.  Microscope was brought in for the microdissection.  Kerrison punch was used  to continue the decompressive laminectomy.  The left side to medial  facetectomy removed the top part of the L5 and doing a foraminotomy over the  L5 root, also.  Significant ligament facet hypertrophy was found as we  continued to decompress the lateral gutter, again using the drill and  Kerrison punches.  When finished, we had good posterior decompression.  We  used upper  angled curets to access ligament both cephalad and caudally in  the central canal and in the foramen for the 4 root.  It was also cleaned  out in this manner.  In disk space, large osteophytes and large central disk protrusion.  Disk  herniation was seen.  Disk space was excised with a #15 blade and diskectomy  performed with pituitary rongeurs and curets and osteophyte __________  used  to remove osteophytes from both the L4 and L5 endplates.  When we had  finished, we had good posterior and anterior and lateral decompression at  the L4-5 disk space area with decompression of L4 and L5 roots.  Wound was  irrigated with antibiotic solution.  Hemostasis was obtained with Gelfoam  and thrombin, which was then irrigated out.  Retractors were removed and the  fascia closed with 0 Vicryl interrupted suture and the subcutaneous tissue  closed with 0 and 3-0 Vicryl interrupted suture, and the skin closed with  Benzoin and Steri-Strips.  Dressing was applied.  The patient was placed  back in the supine position, removed from anesthesia and transferred to  recovery in stable  condition.                                               Otilio Connors, M.D.    JRH/MEDQ  D:  04/08/2004  T:  04/08/2004  Job:  [id number]

## 2011-01-28 NOTE — Consult Note (Signed)
NAME:  Bryan [name], Bryan [name]                   ACCOUNT NO.:  [id number]   MEDICAL RECORD NO.:  [id number]          PATIENT TYPE:  AMB   LOCATION:  DAY                           FACILITY:  [facility]   PHYSICIAN:  R. Garfield Cornea, M.D. DATE OF BIRTH:  11-20-1955   DATE OF CONSULTATION:  09/29/2004  DATE OF DISCHARGE:                                   CONSULTATION   REQUESTING PHYSICIAN:  Dr. Legrand Rams.   REASON FOR CONSULTATION:  GERD, possible EGD.   HISTORY OF PRESENT ILLNESS:  Bryan [name] is a 55 year old white gentleman who  presents today for further evaluation of abdominal pain. He states for the  last couple of months he has had epigastric pain. Pain seems to be worse  with eating, especially with greasy foods. He also complains of solid food  getting stuck when he swallows. He has episodes of vomiting when he tries to  wash the food down. He also has vomiting in the morning times, almost on a  daily basis. He has been on Prevacid for a couple of months at b.i.d. and  has not had any improvement. His bowels move 3 to 4 times daily. For the  last couple of weeks, he has had looser stools than normal. He has been on  antibiotics for about a week, and the diarrhea seems to be worse. He was on  antibiotics for abscess. He states that he has had black tarry stools on 2  or 3 occasions, last time a couple of days ago. He has also had dark red  blood in his stools at least twice in the last couple of weeks. He states he  is unable to eat. He has lost 10 pounds in the last 2 weeks. He also  consumes 1 to 2 beers daily on most days and states that it often helps  relieve his pain. He has discontinued alcohol and states this made no  difference, and his pain does not seem to get any better. He states he was  treated for an ulcer in the past but did not have any radiological work up  or EGD.   CURRENT MEDICATIONS:  Amoxicillin and Lortab.   ALLERGIES:  No known drug allergies.   PAST MEDICAL HISTORY:  Negative  for chronic illnesses.   PAST SURGICAL HISTORY:  Lumbar back surgery in July of 2005.   FAMILY HISTORY:  Brother had stomach problems, details unavailable.   SOCIAL HISTORY:  He is single. He is employed at [employer]. He smokes  half a pack of cigarettes daily. He drinks 1 to 2 beers in the evenings when  he gets off work.   PHYSICAL EXAMINATION:  VITAL SIGNS:  Weight 127. Temperature 98.4, blood  pressure 138/96, pulse 80.  GENERAL:  Pleasant well-developed, well-nourished black male in no acute  distress.  SKIN:  Warm and dry. No jaundice.  HEENT:  Pupils are equal, round, and reactive to light. Conjunctivae are  pink. Sclerae are muddy. Oropharyngeal mucosa moist and pink. No lesions,  erythema, or exudate. He has multiple missing teeth. No lymphadenopathy or  thyromegaly.  CHEST:  Lungs are clear to auscultation.  CARDIAC:  Reveals regular rate and rhythm. Normal S1 and S2. No murmurs,  rubs, or gallops.  ABDOMEN:  Positive bowel sounds. Soft, nondistended. Liver edge is palpable  3 fingerbreadths below the right costal margin in the mid clavicular line.  He has tenderness with inspiration in this region, especially when the liver  is palpated. He also has tenderness in the epigastric region to deep  palpation. No rebound tenderness or guarding. No splenomegaly or mass.  EXTREMITIES:  No edema.   LABORATORY DATA:  From August 20, 2004 revealed hemoglobin of 17.4,  hematocrit 49.9, white count 9.8, platelets 230,000. BUN 6, creatinine 0.9.  Alkaline phosphatase 151, total bilirubin 0.2, AST 19, ALT 14, albumin 4.   IMPRESSION:  Bryan [name] is a 55 year old gentleman who presents with a couple month  history of epigastric pain especially postprandially, dysphagia, melena,  hematochezia, and two to three week history of diarrhea. He has had weight  loss due to increased oral intake. Given history of melena and dark red  blood per rectum, I would be concerned about peptic ulcer  disease. He may  also have severe esophagitis versus esophageal stricture given his  dysphagia. He has not received any benefit from PPI therapy at b.i.d.  dosing. Cannot rule out underlying gallbladder disease. Diarrhea seems to be  more recent of an issue. He has been on amoxicillin which may causing the  diarrhea. Cannot rule out clostridium difficile or other forms of colitis. I  discussed with Bryan [name] today that initially we need to rule out bleeding peptic  ulcer disease and will plan on semi-urgent EGD tomorrow. If EGD is negative,  he will need to have further evaluation via potentially abdominal ultrasound  and colonoscopy plus or minus stool studies.   PLAN:  1.  EGD plus or minus esophageal dilatation by Dr. Gala Romney tomorrow.  2.  CBC, LFTs, amylase, and lipase.  3.  If EGD is unremarkable, would consider abdominal ultrasound to rule out      biliary etiology. Also consider colonoscopy and/or stool studies given      bloody diarrhea.     Lesl   LL/MEDQ  D:  09/29/2004  T:  09/29/2004  Job:  [id number]

## 2011-01-28 NOTE — Op Note (Signed)
NAME:  Bryan [name], Bryan [name]                   ACCOUNT NO.:  [id number]   MEDICAL RECORD NO.:  [id number]          PATIENT TYPE:  AMB   LOCATION:  DAY                           FACILITY:  [facility]   PHYSICIAN:  R. Garfield Cornea, M.D. DATE OF BIRTH:  1956/03/19   DATE OF PROCEDURE:  09/30/2004  DATE OF DISCHARGE:                                 OPERATIVE REPORT   PROCEDURE:  Diagnostic esophagogastroduodenoscopy.   INDICATIONS FOR PROCEDURE:  A 55 year old gentleman with history of  epigastric pain, postprandial dysphagia, and reported melena and  hematochezia.  EGD is now being done.  He has had little improvement with  b.i.d. PPI therapy.   EGD is now being done.  This approach has been discussed with the patient at  length.  Potential risks, benefits, and alternatives have been reviewed.  Please see the documentation in the medical record.   DESCRIPTION OF PROCEDURE:  O2 saturation, blood pressure, and pulse were  placed on the monitor.  At that time, conscious sedation with Versed 4 mg IV  and Demerol 100 mg IV in divided doses.  Instrument was the Olympus video  system.   FINDINGS:  Examination of the tubular esophagus revealed erosions at the EG  junction straddling the squamocolumnar junction.  The esophageal lumen was  widely patent all the way down.  There was no evidence of Barrett esophagus  or tumor.  There was no ring stricture or other abnormality.  The EG  junction was easily traversed to enter the stomach.  The gastric cavity was  empty and insufflated well with air.  The gastric cavity had a U-shaped  configuration or at least a very accentuated J-shape; however, otherwise,  the gastric mucosa appeared normal.  The pylorus was patent and easily  traversed.  Examination of the bulb and second portion was undertaken.  There was diffuse erosion and edema of the bulbar mucosa; however, I was  unable to identify a discrete ulcer.  Please see photos.   THERAPEUTIC/DIAGNOSTIC MANEUVERS  PERFORMED:  None.   CONDITION:  The patient tolerated the procedure well and was reactive after  endoscopy.   IMPRESSION:  1.  Distal esophageal erosions consistent with erosive reflux esophagitis.      Otherwise, normal tubular esophagus.  2.  U-shaped stomach of uncertain significance.  Wonder about partial      volvulus or paraesophageal herniation.  Marked erosions, edema of the      bulb, without discrete ulcer disease being found.  Otherwise, normal D1      and D2.   RECOMMENDATIONS:  1.  Will check Helicobacter pylori serology studies.  2.  Will obtain an upper GI series to get a better look at the gross anatomy      in the stomach.  3.  Further recommendations to follow.      RMR/MEDQ  D:  09/30/2004  T:  09/30/2004  Job:  [id number]

## 2011-02-08 DIAGNOSIS — R072 Precordial pain: Secondary | ICD-10-CM

## 2011-04-14 ENCOUNTER — Encounter: Payer: Self-pay | Admitting: Internal Medicine

## 2011-04-14 ENCOUNTER — Emergency Department (HOSPITAL_COMMUNITY): Payer: Medicare Other

## 2011-04-14 ENCOUNTER — Observation Stay (HOSPITAL_COMMUNITY)
Admission: EM | Admit: 2011-04-14 | Discharge: 2011-04-15 | Disposition: A | Discharge status: Home / Self Care | Payer: Medicare Other | Attending: Internal Medicine | Admitting: Internal Medicine

## 2011-04-14 DIAGNOSIS — R0989 Other specified symptoms and signs involving the circulatory and respiratory systems: Secondary | ICD-10-CM | POA: Insufficient documentation

## 2011-04-14 DIAGNOSIS — M5030 Other cervical disc degeneration, unspecified cervical region: Secondary | ICD-10-CM | POA: Insufficient documentation

## 2011-04-14 DIAGNOSIS — R61 Generalized hyperhidrosis: Secondary | ICD-10-CM | POA: Insufficient documentation

## 2011-04-14 DIAGNOSIS — K219 Gastro-esophageal reflux disease without esophagitis: Secondary | ICD-10-CM | POA: Insufficient documentation

## 2011-04-14 DIAGNOSIS — R0609 Other forms of dyspnea: Secondary | ICD-10-CM | POA: Insufficient documentation

## 2011-04-14 DIAGNOSIS — M51379 Other intervertebral disc degeneration, lumbosacral region without mention of lumbar back pain or lower extremity pain: Secondary | ICD-10-CM | POA: Insufficient documentation

## 2011-04-14 DIAGNOSIS — R42 Dizziness and giddiness: Secondary | ICD-10-CM | POA: Insufficient documentation

## 2011-04-14 DIAGNOSIS — R197 Diarrhea, unspecified: Secondary | ICD-10-CM | POA: Insufficient documentation

## 2011-04-14 DIAGNOSIS — Z79899 Other long term (current) drug therapy: Secondary | ICD-10-CM | POA: Insufficient documentation

## 2011-04-14 DIAGNOSIS — E860 Dehydration: Secondary | ICD-10-CM | POA: Insufficient documentation

## 2011-04-14 DIAGNOSIS — M5137 Other intervertebral disc degeneration, lumbosacral region: Secondary | ICD-10-CM | POA: Insufficient documentation

## 2011-04-14 DIAGNOSIS — R55 Syncope and collapse: Principal | ICD-10-CM | POA: Insufficient documentation

## 2011-04-14 LAB — CBC
Hemoglobin: 15.7 g/dL (ref 13.0–17.0)
MCV: 85.8 fL (ref 78.0–100.0)
Platelets: 250 10*3/uL (ref 150–400)
RBC: 5.01 MIL/uL (ref 4.22–5.81)
WBC: 15.8 10*3/uL — ABNORMAL HIGH (ref 4.0–10.5)

## 2011-04-14 LAB — DIFFERENTIAL
Eosinophils Absolute: 0.1 10*3/uL (ref 0.0–0.7)
Lymphs Abs: 3 10*3/uL (ref 0.7–4.0)
Neutro Abs: 11.7 10*3/uL — ABNORMAL HIGH (ref 1.7–7.7)
Neutrophils Relative %: 74 % (ref 43–77)

## 2011-04-14 LAB — BASIC METABOLIC PANEL
CO2: 23 mEq/L (ref 19–32)
Chloride: 103 mEq/L (ref 96–112)
GFR calc Af Amer: >60 mL/min (ref >60)
Potassium: 3.2 mEq/L — ABNORMAL LOW (ref 3.5–5.1)

## 2011-04-14 LAB — D-DIMER, QUANTITATIVE: D-Dimer, Quant: 0.32 ug/mL-FEU (ref 0.00–0.48)

## 2011-04-14 LAB — TROPONIN I: Troponin I: <0.3 ng/mL (ref <0.30)

## 2011-04-14 NOTE — H&P (Signed)
Hospital Admission Note Date: 04/14/2011  Patient name: Bryan [name] Medical record number: [id number] Date of birth: 08/05/1956 Age: 55 y.o. Gender: male PCP: No primary provider on file.  Medical Service: Lane/B2  Attending physician: Dr. Nance Pew     Resident (R2/R3): Devani    XTAVW:[phone] Resident (R1): Wainright   KPVVZ:[phone]  Chief Complaint:  "I fell down."  History of Present Illness: This is a 55 y/o man presented with a chief complaint of 3-4 syncopal episodes after standing up to walk to the bathroom 3 hours prior to admission. The syncope was preceded by dizziness, SOB, flushing and sweating. He also reports having nausea and 4-5 episodes of vomiting.  He denies any trauma "as he landed on his rear."  However, he did say "that he blacked out" for an unknown period of time. The event was unwitnessed. He denies new weakness, Altered sensation, Vision problems, Impaired speech/swallowing, Decreased ability to stand/walk, Vertigo, confusion, Headache. He denies any CP, Seizure-like activity, fever, chills, sick contacts, dysuria or abdominal pain.  Patient also reports a sudden onset of one episode of diarrhea on the day of admission described as loose stool without any blood or mucus. He reports having a tooth extraction on 04/10/2011 with a subsequent treatment with an oral PCN.  The patient has a history of HTN but no A. fib or other cardiac arrhythmias or blood clotting disorder.  He denies any similar symptoms before.  ER Tx given- Was found to be orthostatic which resolved after 2 L IVF of NS   Meds: Medication Sig  . pregabalin (LYRICA) 100 MG capsule Take 100 mg by mouth 2 (two) times daily.    Amlodipine, unknown dosage Penicillin, unknown dosage Prilosec, unknown dosage  Allergies: Review of patient's allergies indicates no known allergies.  Past Medical History  Diagnosis Date  . Chronic neck and lower back pains    with leg and back pain (disc problem)  .  History of upper GI x-ray seriesto follow showed large duodenal ulcer H pylori serologies were negative    HTN   GERD   Past Surgical History  Procedure Date  . Esophagogastroduodenoscopy 1/06   Distal esophageal erosions,U-shaped stomach,marked erosions and edema of the bulb   Colonoscopy with polypectomy   (tubular adenoma per cytology)                      10/2010  . Back surgery 7/05; 5/09    Dr.Hirsch,3 lumbar  . Neck surgery laminectomy 2/09   Family History Denies family history of heart problems, seizures, frequent fainting or HTN, DM, cancer.  History   Social History  . Marital Status: Single    Spouse Name: N/A    Number of Children: N/A  . Years of Education: N/A   Social History Main Topics  . Smoking status: Current smoker  . Smokeless tobacco: Not on file  . Alcohol Use: Former  . Drug Use: No  . Sexually Active: Not on file   Social History Narrative  . Lives with girlfriend, is on disability for history of back injuries.   Review of Systems:per HPI  Physical Exam: VS: BP 138/77 PR 64 RR 18. Temp 87.3 O2 Sats 95% RA General: Vital signs reviewed and noted. Well-developed, well-nourished, in no acute distress; alert, appropriate and cooperative throughout examination.  Head: Normocephalic, atraumatic.  Neck: No deformities, masses, or tenderness noted.  Lungs:  Normal respiratory effort. Clear to auscultation BL without crackles or wheezes.  Heart:  RRR. S1 and S2 normal without gallop, murmur, or rubs.  Abdomen:  BS normoactive. Soft, Nondistended, non-tender.  No masses or organomegaly.  Extremities: No pretibial edema.  Neuro:               Alert X oriented X3, cranial nerves grossly intact, sensation subjectively       decreased in left lower anterior leg though not on plantar surface and       lateral surface of leg, strength slightly decreased on left leg raise, otherwise       equal bilaterally.  Lab results: Admission on 04/14/2011  Component  Date Value Range Status  . Glucose-Capillary (mg/dL) 04/14/2011 163* 70-99 Final  . Neutrophils Relative (%) 04/14/2011 74  43-77 Final  . Neutro Abs (K/uL) 04/14/2011 11.7* 1.7-7.7 Final  . Lymphocytes Relative (%) 04/14/2011 19  12-46 Final  . Lymphs Abs (K/uL) 04/14/2011 3.0  0.7-4.0 Final  . Monocytes Relative (%) 04/14/2011 6  3-12 Final  . Monocytes Absolute (K/uL) 04/14/2011 0.9  0.1-1.0 Final  . Eosinophils Relative (%) 04/14/2011 1  0-5 Final  . Eosinophils Absolute (K/uL) 04/14/2011 0.1  0.0-0.7 Final  . Basophils Relative (%) 04/14/2011 0  0-1 Final  . Basophils Absolute (K/uL) 04/14/2011 0.0  0.0-0.1 Final  . WBC (K/uL) 04/14/2011 15.8* 4.0-10.5 Final  . RBC (MIL/uL) 04/14/2011 5.01  4.22-5.81 Final  . Hemoglobin (g/dL) 04/14/2011 15.7  13.0-17.0 Final  . HCT (%) 04/14/2011 43.0  39.0-52.0 Final  . MCV (fL) 04/14/2011 85.8  78.0-100.0 Final  . MCH (pg) 04/14/2011 31.3  26.0-34.0 Final  . MCHC (g/dL) 04/14/2011 36.5* 30.0-36.0 Final  . RDW (%) 04/14/2011 14.0  11.5-15.5 Final  . Platelets (K/uL) 04/14/2011 250  150-400 Final  . D-Dimer, Quant (ug/mL-FEU) 04/14/2011 0.32  0.00-0.48 Final  . Sodium (mEq/L) 04/14/2011 138  135-145 Final  . Potassium (mEq/L) 04/14/2011 3.2* 3.5-5.1 Final  . Chloride (mEq/L) 04/14/2011 103  96-112 Final  . CO2 (mEq/L) 04/14/2011 23  19-32 Final  . Glucose, Bld (mg/dL) 04/14/2011 150* 70-99 Final  . BUN (mg/dL) 04/14/2011 8  6-23 Final  . Creatinine, Ser (mg/dL) 04/14/2011 0.99  0.50-1.35 Final  . Calcium (mg/dL) 04/14/2011 9.3  8.4-10.5 Final  . GFR calc non Af Amer (mL/min) 04/14/2011 >60  >60 Final  . GFR calc Af Amer (mL/min) 04/14/2011 >60  >60 Final  . Troponin I (ng/mL) 04/14/2011 <0.30  <0.30 Final   D-dimer: 0.32  Urine Drug Screen: pending  Urinalysis: pending  C. Diff PCR: pending.  Imaging results:  Upright abdomen Xray: pending Other results: EKG: normal sinus rhythm,unchanged from previous tracings  ASSESSMENT: 1.    -Dizziness due to *Hypovolemia and vasovagal response *Arrhythmia *TIA/CVA/subdural hemorrhage>unlikely given nonfocal neuro exam. However, may consider CT of head if there is no improvement of Sx. * Labyrinthitis 2. Diarrhea due to ?C. Diff infection in light of a recent PCN use; vs viral gastroenteritis 3. LBP with left LE numbness. No S:S of cauda equina or MSK weakness. Consider PT/OT.  PLAN: -CE x 2 q 6 hr -EKG now and in AM, telemetry -Upright abdomen Xray to evaluate for free air given recent vomitting/wretching - UA, UDS, ETOH level -NPO due to emesis -IVF: NS +10 meq/L of KCL at 100 cc/hr -Home meds: hold amlodipine due to orthostasis -Protonix 40 mg IV q 12 hrs. -Zofran 2-4 mg IV q 4 hrs PRN. -Tylenol 650 mg PO q 4-6 hr PRN pain -Morphine 2 mg IV q 4 hrs PRN fro pain -  Lovenox - consider Tilt table test if syncope recurs.   R2/3_Karimova_____________________________      R1_Thomas_______________________________  ATTENDING: I performed and/or observed a history and physical examination of the patient.  I discussed the case with the residents as noted and reviewed the residents' notes.  I agree with the findings and plan--please refer to the attending physician note for more details.  Signature________________________________  Printed Name_____________________________

## 2011-04-15 ENCOUNTER — Observation Stay (HOSPITAL_COMMUNITY): Payer: Medicare Other

## 2011-04-15 DIAGNOSIS — R079 Chest pain, unspecified: Secondary | ICD-10-CM

## 2011-04-15 LAB — TSH: TSH: 1.227 u[IU]/mL (ref 0.350–4.500)

## 2011-04-15 LAB — RENAL FUNCTION PANEL
Albumin: 3.6 g/dL (ref 3.5–5.2)
BUN: 7 mg/dL (ref 6–23)
CO2: 24 mEq/L (ref 19–32)
Calcium: 8.2 mg/dL — ABNORMAL LOW (ref 8.4–10.5)
Chloride: 108 mEq/L (ref 96–112)
Creatinine, Ser: 0.84 mg/dL (ref 0.50–1.35)
GFR calc Af Amer: >60 mL/min (ref >60)
GFR calc non Af Amer: >60 mL/min (ref >60)
Glucose, Bld: 115 mg/dL — ABNORMAL HIGH (ref 70–99)
Phosphorus: 3 mg/dL (ref 2.3–4.6)
Potassium: 3.8 mEq/L (ref 3.5–5.1)
Sodium: 141 mEq/L (ref 135–145)

## 2011-04-15 LAB — CARDIAC PANEL(CRET KIN+CKTOT+MB+TROPI)
CK, MB: 3.8 ng/mL (ref 0.3–4.0)
Relative Index: 1.5 (ref 0.0–2.5)
Relative Index: 1.4 (ref 0.0–2.5)
Total CK: 260 U/L — ABNORMAL HIGH (ref 7–232)
Troponin I: <0.3 ng/mL (ref <0.30)

## 2011-04-15 LAB — DRUGS OF ABUSE SCREEN W/O ALC, ROUTINE URINE
Creatinine,U: 95.1 mg/dL
Marijuana Metabolite: POSITIVE — AB
Methadone: NEGATIVE
Opiate Screen, Urine: NEGATIVE
Propoxyphene: NEGATIVE

## 2011-04-15 LAB — HEPATIC FUNCTION PANEL
ALT: 12 U/L (ref 0–53)
AST: 18 U/L (ref 0–37)
Bilirubin, Direct: <0.1 mg/dL (ref 0.0–0.3)

## 2011-04-15 LAB — URINALYSIS, ROUTINE W REFLEX MICROSCOPIC
Glucose, UA: NEGATIVE mg/dL
Ketones, ur: NEGATIVE mg/dL
Leukocytes, UA: NEGATIVE
Nitrite: NEGATIVE
Protein, ur: NEGATIVE mg/dL

## 2011-04-15 LAB — HIV ANTIBODY (ROUTINE TESTING W REFLEX): HIV: NONREACTIVE

## 2011-04-15 LAB — ETHANOL: Alcohol, Ethyl (B): <11 mg/dL (ref 0–11)

## 2011-04-15 LAB — AMYLASE: Amylase: 96 U/L (ref 0–105)

## 2011-04-23 NOTE — Discharge Summary (Signed)
NAMEJAHMIER, Bryan [name] NO.:  [id number]  MEDICAL RECORD NO.:  [id number]  LOCATION:  8527                         FACILITY:  [facility]  PHYSICIAN:  Desiree Hane, MD      DATE OF BIRTH:  1956-04-02  DATE OF ADMISSION:  04/14/2011 DATE OF DISCHARGE:  04/15/2011                              DISCHARGE SUMMARY   PRIMARY CARE PHYSICIAN:  Tesfaye D. Legrand Rams, MD in [place].  PRIMARY GASTROENTEROLOGIST:  Bridgette Habermann, MD [facility].  DISCHARGE DIAGNOSES: 1. Vasovagal syncope secondary to dehydration. 2. Melena with the EGD in 2006, showing distal esophageal erosion.  It     is probably secondary to chronic NSAID use.  The patient will have     a followup EGD after this discharge as outpatient. 3. Chronic neck and lower back pain secondary to degenerative joint     disease. 4. Gastroesophageal reflux disease. 5. History of colonoscopy with polypectomy for tubular adenomas in     February 2012. 6. History of back surgeries by Dr. Luiz Ochoa. 7. History of neck surgery.  DISCHARGE MEDICATIONS: 1. Nexium 40 mg 1 tablet by mouth twice a day. 2. Vicodin 5/500 mg 1 tablet by mouth every 4 h. as needed for pain.  The patient is to stop taking ibuprofen, Aleve, Celebrex and Advil.  DISPOSITION AND FOLLOWUP:  The patient will follow up with his primary care physician on April 25, 2011, 10:30 in the morning at that time following issues needs to be checked upon: 1. Hypotension.  The patient had viral gastroenteritis and diarrhea     causing hypotension leading to syncope prior to this admission.  He     was orthostatic at admission.  Please check his orthostatic vitals     at followup visit and make sure that his blood pressure is stable. 2. GI bleed.  The patient had Hemoccult positive stools during this     hospitalization.  His hemoglobin was stable.  He has a history of     esophagitis, so he will need to be followed up with his  gastroenterologist, Dr. Gala Romney for an repeat EGD.  This appointment     will need to be scheduled by the patient and his primary care     physician when he follows up with them in the outpatient setting.  PROCEDURES PERFORMED:  None.  CONSULTATIONS:  None.  ADMITTING HISTORY AND PHYSICAL:  A 55 year old man with past medical history dictated above present with chief complaint of syncopal episode 3-4 times after trying to stand up to walk to the bathroom 3 hours prior to the admission.  The syncope was preceded by dizziness, shortness of breath, flushing and sweating.  He also reports having nausea and 4-5 episodes of vomiting.  He denies any trauma to his head.  However, he says that he did blackout for few seconds during this episode.  This event was unwitnessed and the history might not be totally accurate as to how long was he passed out for.  He denies any confusion, focal neurological weakness, altered sensation, vision problems or speech, swallowing difficulty before or after the syncope.  The patient  has been having episodes of diarrhea and abdominal discomfort, since he has started taking penicillin after his tooth extraction on April 10, 2011. He denies similar symptoms in the past.  ADMITTING PHYSICAL EXAMINATION:  VITAL SIGNS:  The patient was orthostatic in the emergency room with a blood pressure ranging 120s- 130s range, pulse 64, respirations 18, temperature 97.3 and oxygen saturation 95% on room air. GENERAL:  No acute distress. HEAD:  Normocephalic, atraumatic. LUNGS:  Clear to auscultation bilaterally. HEART:  Regular rate and rhythm.  No murmurs. ABDOMEN:  Soft, nontender AND nondistended.  Normal bowel sounds. EXTREMITIES:  No edema. NEUROLOGICAL:  Alert and oriented x3.  Cranial nerves II through XII intact.  Sensation is normal.  Motor strength good bilaterally. Reflexes 2+ bilaterally.  Cerebellar signs negative.  HOSPITAL COURSE: 1. Syncope.  Given the  patient's history of diarrhea and symptoms     preceding the syncope, it was most likely secondary to dehydration     and vasovagal attack.  He did not have any confusion or any tongue     biting or any seizures before or after the syncope.  His     neurological exam was nonfocal making a neurological event as a     cause of syncope very unlikely.  He started feeling much better     after IV fluid hydration.  He did not have any further episodes of     syncope in the hospital.  His telemetry monitoring did not show any     evidence of arrhythmia.  His blood pressure was stabilized with IV     fluids without any further episodes of hypotension with ambulation.     No further workup has been done for his syncope at this time.  He     will be seeing his primary care physician in 2 weeks of this     discharge at which time further assessment if required may need to     be done. 2. GI bleeding.  The patient's hemoglobin was stable during this     hospitalization.  However with history of dark stools prior to     admission, we checked his stool for blood and it was Hemoccult     positive.  Again because his hemoglobin was stable, we have not     pursued this further, but we will ask his primary care physician to     schedule an outpatient appointment with his gastroenterologist     which is Dr. Gala Romney for a possible EGD.  We have tried to call the     gastroenterologist office for an appointment, but we have not been     able to do so because the office is closed at the time of     discharge.  We have asked the patient to come to the emergency     department if he has any severe bleeding, any bright red bleeding     in his stool or persistent dizziness.  The patient has a history of     duodenal ulcer and esophagitis in his past EGD in 2006.  He     continues to take NSAIDs for his arthritis.  We have strongly     advised him not to take any NSAIDs until he sees his     gastroenterologist.  We  have also increased his Nexium from 40 mg     once a day to two times a day at this time.  We will  ask his     primary care physician to check CBC and a followup appointment to     make sure his hemoglobin is stable.  His hemoglobin at the time of     discharge was 15.7.  His hemoglobin at the time of this discharge     is 15.7.  DISCHARGE VITALS:  Temperature 98.2, pulse 85, respirations 16, blood pressure 150/84 and oxygen saturation 96% on room air.  DISCHARGE LABORATORY DATA:  Hemoglobin 15.7, white count 15.8, platelet count 250.  Sodium 141, potassium 3.8, chloride 108, bicarb 24, BUN 7, creatinine 0.84.  Liver function tests normal, albumin 3.0, TSH 1.227, HbA1c 6.3.     [name] [place], MD     MD/MEDQ  D:  04/15/2011  T:  04/16/2011  Job:  [id number]  cc:   Tesfaye D. Legrand Rams, MD R. Garfield Cornea, MD FACP [facility]  Electronically Signed by [name] [place]  on 04/21/2011 11:24:20 PM Electronically Signed by Desiree Hane  on 04/23/2011 03:24:02 PM

## 2011-05-31 ENCOUNTER — Emergency Department (HOSPITAL_COMMUNITY)
Admission: EM | Admit: 2011-05-31 | Discharge: 2011-05-31 | Disposition: A | Discharge status: Home / Self Care | Payer: Medicare Other | Attending: Emergency Medicine | Admitting: Emergency Medicine

## 2011-05-31 ENCOUNTER — Encounter (HOSPITAL_COMMUNITY): Payer: Self-pay | Admitting: Emergency Medicine

## 2011-05-31 DIAGNOSIS — M5430 Sciatica, unspecified side: Secondary | ICD-10-CM

## 2011-05-31 DIAGNOSIS — M545 Low back pain, unspecified: Secondary | ICD-10-CM | POA: Insufficient documentation

## 2011-05-31 DIAGNOSIS — F17200 Nicotine dependence, unspecified, uncomplicated: Secondary | ICD-10-CM | POA: Insufficient documentation

## 2011-05-31 DIAGNOSIS — I10 Essential (primary) hypertension: Secondary | ICD-10-CM | POA: Insufficient documentation

## 2011-05-31 DIAGNOSIS — K219 Gastro-esophageal reflux disease without esophagitis: Secondary | ICD-10-CM | POA: Insufficient documentation

## 2011-05-31 DIAGNOSIS — G8929 Other chronic pain: Secondary | ICD-10-CM | POA: Insufficient documentation

## 2011-05-31 HISTORY — DX: Gastro-esophageal reflux disease without esophagitis: K21.9

## 2011-05-31 HISTORY — DX: Essential (primary) hypertension: I10

## 2011-05-31 MED ORDER — OXYCODONE-ACETAMINOPHEN 5-325 MG PO TABS: 1.0000 | ORAL_TABLET | ORAL | Status: AC | PRN

## 2011-05-31 NOTE — ED Notes (Signed)
Pt c/o left leg and lower back pain x 3 days. Pt denies any injury.

## 2011-05-31 NOTE — ED Provider Notes (Addendum)
History     CSN: [id number] Arrival date & time: 05/31/2011  1:57 PM   Chief Complaint  Patient presents with  . Back Pain  . Leg Pain     (Include location/radiation/quality/duration/timing/severity/associated sxs/prior treatment) Patient is a 55 y.o. male presenting with back pain and leg pain. The history is provided by the patient.  Back Pain  This is a recurrent problem. The current episode started more than 2 days ago. The problem occurs constantly. The problem has not changed since onset.The pain is associated with no known injury (No new injury.  Has history of lumbar fusion surgery). The pain is present in the lumbar spine. The quality of the pain is described as stabbing. The pain is at a severity of 7/10. The pain is moderate. The symptoms are aggravated by certain positions and bending. The pain is worse during the day. Associated symptoms include leg pain. Pertinent negatives include no chest pain, no fever, no numbness, no headaches, no abdominal pain, no abdominal swelling, no bowel incontinence, no perianal numbness, no paresthesias, no paresis, no tingling and no weakness. He has tried heat for the symptoms. The treatment provided no relief.  Leg Pain  Pertinent negatives include no numbness and no tingling.     Past Medical History  Diagnosis Date  . Chronic pain     with leg and back pain (disc problem)  . History of upper GI x-ray series     to follow showed large duodenal ulcer H pylori serologies were negative  . Acid reflux   . Hypertension      Past Surgical History  Procedure Date  . Esophagogastroduodenoscopy 1/06    Distal esophageal erosions,U-shaped stomach,marked erosions and edema of the buld  . Back surgery 7/05; 5/09     Dr.Hirsch,3 lumbar  . Neck surgery 2/09  . Back surgery     History reviewed. No pertinent family history.  History  Substance Use Topics  . Smoking status: Current Everyday Smoker -- 0.5 packs/day  . Smokeless tobacco:  Not on file  . Alcohol Use: No      Review of Systems  Constitutional: Negative for fever.  HENT: Negative for congestion, sore throat and neck pain.   Eyes: Negative.   Respiratory: Negative for chest tightness and shortness of breath.   Cardiovascular: Negative for chest pain.  Gastrointestinal: Negative for nausea, abdominal pain and bowel incontinence.  Genitourinary: Negative.   Musculoskeletal: Positive for back pain. Negative for myalgias, joint swelling and arthralgias.  Skin: Negative.  Negative for rash and wound.  Neurological: Negative for dizziness, tingling, weakness, light-headedness, numbness, headaches and paresthesias.  Hematological: Negative.   Psychiatric/Behavioral: Negative.     Allergies  Review of patient's allergies indicates no known allergies.  Home Medications   Current Outpatient Rx  Name Route Sig Dispense Refill  . AMLODIPINE BESYLATE 5 MG PO TABS Oral Take 5 mg by mouth daily.      Marland Kitchen ESOMEPRAZOLE MAGNESIUM 40 MG PO CPDR Oral Take 40 mg by mouth daily before breakfast.      . HYDROCODONE-ACETAMINOPHEN 5-500 MG PO CAPS Oral Take 1 capsule by mouth every 6 (six) hours as needed. For pain     . LORATADINE 10 MG PO TABS Oral Take 10 mg by mouth daily.      Marland Kitchen PREGABALIN 100 MG PO CAPS Oral Take 100 mg by mouth 2 (two) times daily.        Physical Exam    BP 123/81  Pulse 66  Temp(Src) 98.7 F (37.1 C) (Oral)  Resp 18  Ht _0  (1.6 m)  Wt 127 lb (57.607 kg)  BMI 22.50 kg/m2  SpO2 98%  Physical Exam  Constitutional: He is oriented to person, place, and time. He appears well-developed and well-nourished.  HENT:  Head: Normocephalic.  Eyes: Conjunctivae are normal.  Neck: Normal range of motion. Neck supple.  Cardiovascular: Regular rhythm and intact distal pulses.        Pedal pulses normal.  Pulmonary/Chest: Effort normal. He has no wheezes.  Abdominal: Soft. Bowel sounds are normal. He exhibits no distension and no mass.    Musculoskeletal: Normal range of motion. He exhibits no edema.       Lumbar back: He exhibits tenderness. He exhibits no swelling, no edema and no spasm.  Neurological: He is alert and oriented to person, place, and time. He has normal strength. He displays no atrophy and no tremor. No cranial nerve deficit or sensory deficit. Gait normal.  Reflex Scores:      Patellar reflexes are 1+ on the right side and 1+ on the left side.      Achilles reflexes are 1+ on the right side and 1+ on the left side.      No strength deficit noted in hip and knee flexor and extensor muscle groups.  Ankle flexion and extension intact.  Skin: Skin is warm and dry.  Psychiatric: He has a normal mood and affect.    ED Course  Procedures   Percocet 1 tab po.     MDM Acute on chronic low back pain with no neuro deficits, and no new injury.       Fulton Reek, PA 05/31/11 1443  Medical screening examination/treatment/procedure(s) were performed by non-physician practitioner and as supervising physician I was immediately available for consultation/collaboration.   Dot Lanes, MD 06/13/11 [phone]

## 2011-06-03 LAB — BASIC METABOLIC PANEL
BUN: 4 — ABNORMAL LOW
Calcium: 9.2
GFR calc non Af Amer: >60
Glucose, Bld: 94

## 2011-06-03 LAB — APTT: aPTT: 32

## 2011-06-03 LAB — CBC
Platelets: 229
RDW: 13.6

## 2011-06-03 LAB — PROTIME-INR
INR: 1
Prothrombin Time: 12.9

## 2011-06-08 LAB — URINALYSIS, ROUTINE W REFLEX MICROSCOPIC
Glucose, UA: NEGATIVE
Ketones, ur: NEGATIVE
Protein, ur: NEGATIVE

## 2011-06-08 LAB — PROTIME-INR: Prothrombin Time: 12

## 2011-06-08 LAB — BASIC METABOLIC PANEL
BUN: 7
Creatinine, Ser: 0.88
GFR calc non Af Amer: >60

## 2011-06-08 LAB — CBC
MCV: 90.9
Platelets: 337
WBC: 10.2

## 2011-08-23 ENCOUNTER — Encounter (HOSPITAL_COMMUNITY): Payer: Self-pay

## 2011-08-23 ENCOUNTER — Emergency Department (HOSPITAL_COMMUNITY)
Admission: EM | Admit: 2011-08-23 | Discharge: 2011-08-23 | Disposition: A | Discharge status: Home / Self Care | Payer: Medicare Other | Attending: Emergency Medicine | Admitting: Emergency Medicine

## 2011-08-23 DIAGNOSIS — R059 Cough, unspecified: Secondary | ICD-10-CM | POA: Insufficient documentation

## 2011-08-23 DIAGNOSIS — R05 Cough: Secondary | ICD-10-CM | POA: Insufficient documentation

## 2011-08-23 DIAGNOSIS — J111 Influenza due to unidentified influenza virus with other respiratory manifestations: Secondary | ICD-10-CM | POA: Insufficient documentation

## 2011-08-23 DIAGNOSIS — K219 Gastro-esophageal reflux disease without esophagitis: Secondary | ICD-10-CM | POA: Insufficient documentation

## 2011-08-23 DIAGNOSIS — Z79899 Other long term (current) drug therapy: Secondary | ICD-10-CM | POA: Insufficient documentation

## 2011-08-23 DIAGNOSIS — J45901 Unspecified asthma with (acute) exacerbation: Secondary | ICD-10-CM

## 2011-08-23 DIAGNOSIS — R509 Fever, unspecified: Secondary | ICD-10-CM | POA: Insufficient documentation

## 2011-08-23 DIAGNOSIS — I10 Essential (primary) hypertension: Secondary | ICD-10-CM | POA: Insufficient documentation

## 2011-08-23 DIAGNOSIS — G8929 Other chronic pain: Secondary | ICD-10-CM | POA: Insufficient documentation

## 2011-08-23 DIAGNOSIS — R000 Tachycardia, unspecified: Secondary | ICD-10-CM | POA: Insufficient documentation

## 2011-08-23 DIAGNOSIS — IMO0001 Reserved for inherently not codable concepts without codable children: Secondary | ICD-10-CM | POA: Insufficient documentation

## 2011-08-23 DIAGNOSIS — M549 Dorsalgia, unspecified: Secondary | ICD-10-CM | POA: Insufficient documentation

## 2011-08-23 MED ORDER — ALBUTEROL SULFATE (5 MG/ML) 0.5% IN NEBU
2.5000 mg | INHALATION_SOLUTION | Freq: Once | RESPIRATORY_TRACT | Status: AC
  Administered 2011-08-23: 2.5 mg via RESPIRATORY_TRACT
  Filled 2011-08-23: qty 0.5

## 2011-08-23 MED ORDER — ACETAMINOPHEN 500 MG PO TABS
1000.0000 mg | ORAL_TABLET | Freq: Once | ORAL | Status: AC
  Administered 2011-08-23: 1000 mg via ORAL
  Filled 2011-08-23: qty 2

## 2011-08-23 MED ORDER — OSELTAMIVIR PHOSPHATE 75 MG PO CAPS: 75.0000 mg | ORAL_CAPSULE | Freq: Two times a day (BID) | ORAL | Status: AC

## 2011-08-23 MED ORDER — OSELTAMIVIR PHOSPHATE 75 MG PO CAPS: 75.0000 mg | ORAL_CAPSULE | Freq: Two times a day (BID) | ORAL | Status: DC

## 2011-08-23 MED ORDER — ALBUTEROL SULFATE HFA 108 (90 BASE) MCG/ACT IN AERS: 2.0000 | INHALATION_SPRAY | Freq: Four times a day (QID) | RESPIRATORY_TRACT | Status: DC | PRN

## 2011-08-23 NOTE — ED Notes (Signed)
Pt states " got sick last night", has taken alka-seltzer plus cold, robitussin and has an inhaler that he has taken once.  Pt states his chest, stomach, and back hurts from coughing so much. Pt reports dr Aline Brochure use to give him norco 10's that helped and is upset that Legrand Rams will not give him something comparable.  Pt has cough,runny nose, ( yellow drainage), general body aches and chills.

## 2011-08-23 NOTE — ED Provider Notes (Signed)
History     CSN: [id number] Arrival date & time: 08/23/2011 10:58 AM   First MD Initiated Contact with Patient 08/23/11 1115      Chief Complaint  Patient presents with  . Cough  . Generalized Body Aches  . Back Pain  . Nasal Congestion    (Consider location/radiation/quality/duration/timing/severity/associated sxs/prior treatment) Patient is a 55 y.o. male presenting with cough and back pain. The history is provided by the patient. No language interpreter was used.  Cough This is a new problem. The current episode started yesterday. The problem occurs constantly. The problem has been gradually worsening. The cough is non-productive. The maximum temperature recorded prior to his arrival was 101 to 101.9 F. Associated symptoms include chills, myalgias and wheezing. Pertinent negatives include no ear pain and no sore throat. He has tried nothing for the symptoms. He is not a smoker. His past medical history is significant for bronchitis and asthma.  Back Pain  Associated symptoms include a fever.    Past Medical History  Diagnosis Date  . Chronic pain     with leg and back pain (disc problem)  . History of upper GI x-ray series     to follow showed large duodenal ulcer H pylori serologies were negative  . Acid reflux   . Hypertension     Past Surgical History  Procedure Date  . Esophagogastroduodenoscopy 1/06    Distal esophageal erosions,U-shaped stomach,marked erosions and edema of the buld  . Back surgery 7/05; 5/09     Dr.Hirsch,3 lumbar  . Neck surgery 2/09  . Back surgery     No family history on file.  History  Substance Use Topics  . Smoking status: Current Everyday Smoker -- 0.5 packs/day  . Smokeless tobacco: Not on file  . Alcohol Use: No      Review of Systems  Constitutional: Positive for fever and chills.  HENT: Negative for ear pain and sore throat.   Respiratory: Positive for cough and wheezing.   Musculoskeletal: Positive for myalgias and  back pain.  All other systems reviewed and are negative.    Allergies  Review of patient's allergies indicates no known allergies.  Home Medications   Current Outpatient Rx  Name Route Sig Dispense Refill  . ALBUTEROL SULFATE HFA 108 (90 BASE) MCG/ACT IN AERS Inhalation Inhale 2 puffs into the lungs every 6 (six) hours as needed. Asthma Symptoms     . AMLODIPINE BESYLATE 5 MG PO TABS Oral Take 5 mg by mouth daily.      Marland Kitchen ESOMEPRAZOLE MAGNESIUM 40 MG PO CPDR Oral Take 40 mg by mouth daily before breakfast.        BP 131/102  Pulse 120  Temp(Src) 101 F (38.3 C) (Oral)  Ht _0  (1.6 m)  Wt 126 lb (57.153 kg)  BMI 22.32 kg/m2  SpO2 95%  Physical Exam  Nursing note and vitals reviewed. Constitutional: He is oriented to person, place, and time. He appears well-developed and well-nourished. No distress.  HENT:  Head: Normocephalic and atraumatic.  Right Ear: External ear normal.  Left Ear: External ear normal.  Mouth/Throat: Oropharynx is clear and moist and mucous membranes are normal. No uvula swelling. No oropharyngeal exudate, posterior oropharyngeal edema, posterior oropharyngeal erythema or tonsillar abscesses.  Eyes: EOM are normal.  Neck: Normal range of motion.  Cardiovascular: Regular rhythm, S1 normal, S2 normal, normal heart sounds, intact distal pulses and normal pulses.  Tachycardia present.   Pulmonary/Chest: Effort normal. No respiratory distress. He  has no decreased breath sounds. He has wheezes in the right upper field, the right middle field, the right lower field, the left upper field, the left middle field and the left lower field. He has no rhonchi. He has no rales. He exhibits no tenderness.  Abdominal: Soft. He exhibits no distension. There is no tenderness.  Musculoskeletal: Normal range of motion.  Lymphadenopathy:    He has no cervical adenopathy.  Neurological: He is alert and oriented to person, place, and time.  Skin: Skin is warm and dry. He is not  diaphoretic.  Psychiatric: He has a normal mood and affect. Judgment normal.    ED Course  Procedures (including critical care time)  Labs Reviewed - No data to display No results found.   No diagnosis found.    [place], PA 08/23/11 1229

## 2011-08-23 NOTE — ED Notes (Signed)
Pt presents with nasal congestion, cough, back pain, and generalized body aches since yesterday. NAD at this time.

## 2011-08-25 NOTE — ED Provider Notes (Signed)
Medical screening examination/treatment/procedure(s) were performed by non-physician practitioner and as supervising physician I was immediately available for consultation/collaboration.  Nat Christen, MD 08/25/11 2255

## 2011-11-17 ENCOUNTER — Encounter (HOSPITAL_COMMUNITY): Payer: Self-pay | Admitting: *Deleted

## 2011-11-17 ENCOUNTER — Emergency Department (HOSPITAL_COMMUNITY): Payer: Medicare Other

## 2011-11-17 ENCOUNTER — Emergency Department (HOSPITAL_COMMUNITY)
Admission: EM | Admit: 2011-11-17 | Discharge: 2011-11-17 | Disposition: A | Discharge status: Home Health | Payer: Medicare Other | Attending: Emergency Medicine | Admitting: Emergency Medicine

## 2011-11-17 DIAGNOSIS — R209 Unspecified disturbances of skin sensation: Secondary | ICD-10-CM | POA: Insufficient documentation

## 2011-11-17 DIAGNOSIS — M79609 Pain in unspecified limb: Secondary | ICD-10-CM | POA: Insufficient documentation

## 2011-11-17 DIAGNOSIS — G8929 Other chronic pain: Secondary | ICD-10-CM | POA: Diagnosis not present

## 2011-11-17 DIAGNOSIS — K219 Gastro-esophageal reflux disease without esophagitis: Secondary | ICD-10-CM | POA: Insufficient documentation

## 2011-11-17 DIAGNOSIS — I10 Essential (primary) hypertension: Secondary | ICD-10-CM | POA: Diagnosis not present

## 2011-11-17 DIAGNOSIS — M19019 Primary osteoarthritis, unspecified shoulder: Secondary | ICD-10-CM | POA: Diagnosis not present

## 2011-11-17 DIAGNOSIS — M542 Cervicalgia: Secondary | ICD-10-CM | POA: Diagnosis not present

## 2011-11-17 DIAGNOSIS — J45909 Unspecified asthma, uncomplicated: Secondary | ICD-10-CM | POA: Insufficient documentation

## 2011-11-17 DIAGNOSIS — M25519 Pain in unspecified shoulder: Secondary | ICD-10-CM | POA: Diagnosis not present

## 2011-11-17 DIAGNOSIS — Z79899 Other long term (current) drug therapy: Secondary | ICD-10-CM | POA: Diagnosis not present

## 2011-11-17 DIAGNOSIS — M25511 Pain in right shoulder: Secondary | ICD-10-CM

## 2011-11-17 DIAGNOSIS — R000 Tachycardia, unspecified: Secondary | ICD-10-CM | POA: Insufficient documentation

## 2011-11-17 MED ORDER — OXYCODONE-ACETAMINOPHEN 5-325 MG PO TABS
1.0000 | ORAL_TABLET | Freq: Once | ORAL | Status: AC
  Administered 2011-11-17: 1 via ORAL
  Filled 2011-11-17: qty 1

## 2011-11-17 MED ORDER — OXYCODONE-ACETAMINOPHEN 5-325 MG PO TABS: 1.0000 | ORAL_TABLET | ORAL | Status: AC | PRN

## 2011-11-17 NOTE — ED Provider Notes (Signed)
History     CSN: [id number]  Arrival date & time 11/17/11  1506   First MD Initiated Contact with Patient 11/17/11 1539      Chief Complaint  Patient presents with  . Shoulder Pain    (Consider location/radiation/quality/duration/timing/severity/associated sxs/prior treatment) Patient is a 56 y.o. male presenting with shoulder pain. The history is provided by the patient.  Shoulder Pain  He  Has been having pain in both shoulders radiating down into both the upper arms for the last 4 or 5 months. Pain is sharp and intermittent. There is also a numb and tingly feeling. He has also had numbness in his left fifth finger. Pain is severe and he rates it 9/10. Nothing makes it better nothing makes it worse. He has tried taking Celebrex and 30 powder with no relief. He denies any weakness. He saw his PCP who said it was arthritis and recommended that he take the Celebrex. He does have a history of 3 lower back surgeries for herniated discs.  Past Medical History  Diagnosis Date  . Chronic pain     with leg and back pain (disc problem)  . History of upper GI x-ray series     to follow showed large duodenal ulcer H pylori serologies were negative  . Acid reflux   . Hypertension   . Asthma     Past Surgical History  Procedure Date  . Esophagogastroduodenoscopy 1/06    Distal esophageal erosions,U-shaped stomach,marked erosions and edema of the buld  . Back surgery 7/05; 5/09     Dr.Hirsch,3 lumbar  . Neck surgery 2/09  . Back surgery     History reviewed. No pertinent family history.  History  Substance Use Topics  . Smoking status: Current Everyday Smoker -- 0.5 packs/day    Types: Cigarettes  . Smokeless tobacco: Not on file  . Alcohol Use: No      Review of Systems  All other systems reviewed and are negative.    Allergies  Review of patient's allergies indicates no known allergies.  Home Medications   Current Outpatient Rx  Name Route Sig Dispense Refill  .  ALBUTEROL SULFATE HFA 108 (90 BASE) MCG/ACT IN AERS Inhalation Inhale 2 puffs into the lungs every 6 (six) hours as needed. Asthma Symptoms     . ALBUTEROL SULFATE HFA 108 (90 BASE) MCG/ACT IN AERS Inhalation Inhale 2 puffs into the lungs every 6 (six) hours as needed for wheezing. 1 Inhaler 0  . AMLODIPINE BESYLATE 5 MG PO TABS Oral Take 5 mg by mouth daily.      Marland Kitchen ESOMEPRAZOLE MAGNESIUM 40 MG PO CPDR Oral Take 40 mg by mouth daily before breakfast.        BP 126/74  Pulse 108  Temp(Src) 98.4 F (36.9 C) (Oral)  Resp 20  Ht 5' 3" (1.6 m)  Wt 126 lb (57.153 kg)  BMI 22.32 kg/m2  SpO2 96%  Physical Exam  Nursing note and vitals reviewed.  56 year old male who is resting comfortably and in no acute distress. Vital signs are significant for mild tachycardia with heart rate 108. Oxygen saturation is 96% which is normal. Head is normocephalic and atraumatic. PERRLA, EOMI. Oropharynx is clear. Neck is nontender and supple without adenopathy or JVD. There is tenderness to percussion over the cervical spine. Remainder of back is nontender. Lungs are clear without rales, wheezes, rhonchi. Heart has regular rate rhythm without murmur. Abdomen is soft, flat, nontender without masses or hepatosplenomegaly. Jimmy's have no cyanosis  or edema, full range of motion is present. There is no restriction on movement of the shoulders. Skin is warm and dry without rash. Neurologic: Mental status is normal, cranial nerves are intact, there no focal motor deficits. There is isolated numbness of the left fifth finger which would be consistent with a cervical radiculopathy.  ED Course  Procedures (including critical care time)  Results for orders placed during the hospital encounter of 04/14/11  GLUCOSE, CAPILLARY      Component Value Range   Glucose-Capillary 163 (*) 70 - 99 (mg/dL)  DIFFERENTIAL      Component Value Range   Neutrophils Relative 74  43 - 77 (%)   Neutro Abs 11.7 (*) 1.7 - 7.7 (K/uL)    Lymphocytes Relative 19  12 - 46 (%)   Lymphs Abs 3.0  0.7 - 4.0 (K/uL)   Monocytes Relative 6  3 - 12 (%)   Monocytes Absolute 0.9  0.1 - 1.0 (K/uL)   Eosinophils Relative 1  0 - 5 (%)   Eosinophils Absolute 0.1  0.0 - 0.7 (K/uL)   Basophils Relative 0  0 - 1 (%)   Basophils Absolute 0.0  0.0 - 0.1 (K/uL)  CBC      Component Value Range   WBC 15.8 (*) 4.0 - 10.5 (K/uL)   RBC 5.01  4.22 - 5.81 (MIL/uL)   Hemoglobin 15.7  13.0 - 17.0 (g/dL)   HCT 43.0  39.0 - 52.0 (%)   MCV 85.8  78.0 - 100.0 (fL)   MCH 31.3  26.0 - 34.0 (pg)   MCHC 36.5 (*) 30.0 - 36.0 (g/dL)   RDW 14.0  11.5 - 15.5 (%)   Platelets 250  150 - 400 (K/uL)  D-DIMER, QUANTITATIVE      Component Value Range   D-Dimer, Quant 0.32  0.00 - 0.48 (ug/mL-FEU)  BASIC METABOLIC PANEL      Component Value Range   Sodium 138  135 - 145 (mEq/L)   Potassium 3.2 (*) 3.5 - 5.1 (mEq/L)   Chloride 103  96 - 112 (mEq/L)   CO2 23  19 - 32 (mEq/L)   Glucose, Bld 150 (*) 70 - 99 (mg/dL)   BUN 8  6 - 23 (mg/dL)   Creatinine, Ser 0.99  0.50 - 1.35 (mg/dL)   Calcium 9.3  8.4 - 10.5 (mg/dL)   GFR calc non Af Amer >60  >60 (mL/min)   GFR calc Af Amer >60  >60 (mL/min)  TROPONIN I      Component Value Range   Troponin I <0.30  <0.30 (ng/mL)  ETHANOL      Component Value Range   Alcohol, Ethyl (B) <11  0 - 11 (mg/dL)  AMYLASE      Component Value Range   Amylase 96  0 - 105 (U/L)  RENAL FUNCTION PANEL      Component Value Range   Sodium 141  135 - 145 (mEq/L)   Potassium 3.8  3.5 - 5.1 (mEq/L)   Chloride 108  96 - 112 (mEq/L)   CO2 24  19 - 32 (mEq/L)   Glucose, Bld 115 (*) 70 - 99 (mg/dL)   BUN 7  6 - 23 (mg/dL)   Creatinine, Ser 0.84  0.50 - 1.35 (mg/dL)   Calcium 8.2 (*) 8.4 - 10.5 (mg/dL)   Phosphorus 3.0  2.3 - 4.6 (mg/dL)   Albumin 3.6  3.5 - 5.2 (g/dL)   GFR calc non Af Amer >60  >60 (mL/min)  GFR calc Af Amer >60  >60 (mL/min)  LIPID PANEL      Component Value Range   Cholesterol 163  0 - 200 (mg/dL)    Triglycerides 40  <150 (mg/dL)   HDL 45  >39 (mg/dL)   Total CHOL/HDL Ratio 3.6     VLDL 8  0 - 40 (mg/dL)   LDL Cholesterol 110 (*) 0 - 99 (mg/dL)  CARDIAC PANEL(CRET KIN+CKTOT+MB+TROPI)      Component Value Range   Total CK 260 (*) 7 - 232 (U/L)   CK, MB 3.8  0.3 - 4.0 (ng/mL)   Troponin I <0.30  <0.30 (ng/mL)   Relative Index 1.5  0.0 - 2.5   URINALYSIS, ROUTINE W REFLEX MICROSCOPIC      Component Value Range   Color, Urine YELLOW  YELLOW    APPearance CLEAR  CLEAR    Specific Gravity, Urine 1.013  1.005 - 1.030    pH 6.0  5.0 - 8.0    Glucose, UA NEGATIVE  NEGATIVE (mg/dL)   Hgb urine dipstick NEGATIVE  NEGATIVE    Bilirubin Urine NEGATIVE  NEGATIVE    Ketones, ur NEGATIVE  NEGATIVE (mg/dL)   Protein, ur NEGATIVE  NEGATIVE (mg/dL)   Urobilinogen, UA 0.2  0.0 - 1.0 (mg/dL)   Nitrite NEGATIVE  NEGATIVE    Leukocytes, UA NEGATIVE  NEGATIVE   CARDIAC PANEL(CRET KIN+CKTOT+MB+TROPI)      Component Value Range   Total CK 271 (*) 7 - 232 (U/L)   CK, MB 3.9  0.3 - 4.0 (ng/mL)   Troponin I <0.30  <0.30 (ng/mL)   Relative Index 1.4  0.0 - 2.5   HEPATIC FUNCTION PANEL      Component Value Range   Total Protein 6.1  6.0 - 8.3 (g/dL)   Albumin 3.0 (*) 3.5 - 5.2 (g/dL)   AST 18  0 - 37 (U/L)   ALT 12  0 - 53 (U/L)   Alkaline Phosphatase 82  39 - 117 (U/L)   Total Bilirubin 0.3  0.3 - 1.2 (mg/dL)   Bilirubin, Direct <0.1  0.0 - 0.3 (mg/dL)   Indirect Bilirubin NOT CALCULATED  0.3 - 0.9 (mg/dL)  OCCULT BLOOD X 1 CARD TO LAB, STOOL      Component Value Range   Fecal Occult Bld POSITIVE    CLOSTRIDIUM DIFFICILE BY PCR      Component Value Range   C difficile by pcr NEGATIVE  NEGATIVE   TSH      Component Value Range   TSH 1.227  0.350 - 4.500 (uIU/mL)  HEMOGLOBIN A1C      Component Value Range   Hemoglobin A1C 6.3 (*) <5.7 (%)   Mean Plasma Glucose 134 (*) <117 (mg/dL)  HIV ANTIBODY      Component Value Range   HIV NON REACTIVE  NON REACTIVE   DRUGS OF ABUSE SCREEN W/O ALC,  ROUTINE URINE      Component Value Range   Marijuana Metabolite POSITIVE (*) Negative    Amphetamine Screen, Ur NEGATIVE  Negative    Barbiturate Quant, Ur NEGATIVE  Negative    Methadone NEGATIVE  Negative    Benzodiazepines. NEGATIVE  Negative    Phencyclidine (PCP) NEGATIVE  Negative    Cocaine Metabolites NEGATIVE  Negative    Opiate Screen, Urine NEGATIVE  Negative    Propoxyphene NEGATIVE  Negative    Creatinine,U 95.1    THC (MARIJUANA), URINE, CONFIRMATION      Component Value Range  Marijuana, Ur-Confirmation 43 (*) CUT-OFF: 15 (NG/ML)   Dg Cervical Spine Complete  11/17/2011  *RADIOLOGY REPORT*  Clinical Data: Neck and shoulder pain.  CERVICAL SPINE - COMPLETE 4+ VIEW  Comparison: Intraoperative radiographs 10/16/2007.  Findings: Patient is status post anterior discectomy and fusion from C4 through C7 with an anterior plate and screws. There is prominent lucency traversing the C4-C5 disc space concerning for nonunion.  There is mild lucency surrounding the C4 screws.  The additional hardware appears well seated.  There is some heterotopic ossification surrounding the superior and inferior aspects of the plate.  There is mild ossification of the ligamentum nucha.  The alignment is normal.  There is no evidence of acute fracture or traumatic subluxation.  No osseous foraminal stenosis is seen.  IMPRESSION:  1.  No acute osseous findings. 2.  Suspected nonunion across the C4-C5 disc space status post C4- C7 ACDF.  Original Report Authenticated By: Vivia Ewing, M.D.   Dg Shoulder Right  11/17/2011  *RADIOLOGY REPORT*  Clinical Data: Shoulder pain.  RIGHT SHOULDER - 2+ VIEW  Comparison: 02/17/2007.  Findings: The mineralization and alignment are normal.  There is no evidence of acute fracture or dislocation.  The subacromial space is preserved.  There are mild acromioclavicular degenerative changes.  IMPRESSION: No acute osseous findings.  Acromioclavicular degenerative changes.   Original Report Authenticated By: Vivia Ewing, M.D.   Dg Shoulder Left  11/17/2011  *RADIOLOGY REPORT*  Clinical Data: Shoulder pain.  LEFT SHOULDER - 2+ VIEW  Comparison: None.  Findings: The mineralization and alignment are normal.  There is no evidence of acute fracture or dislocation.  The subacromial space is preserved.  There are mild acromioclavicular degenerative changes.  IMPRESSION: No acute osseous findings. Acromioclavicular degenerative changes.  Original Report Authenticated By: Vivia Ewing, M.D.    X-rays show a hardware from prior neck surgery. He is given a prescription for Percocet and referred back to his neurosurgeon.  1. Shoulder pain, bilateral       MDM  Bilateral shoulder and upper arm pain which I feel is most likely referred pain from degenerative disc disease in the neck. For preliminary evaluation, x-rays will be obtained of neck and shoulders. He has seen Dr. Luiz Ochoa in the past for lumbar spine surgery and he will be referred to Dr. Luiz Ochoa for further evaluation.        Delora Fuel, MD 14/43/60 1658

## 2011-11-17 NOTE — ED Notes (Signed)
Pt with upper back pain that radiates into bilateral shoulders, c/o numbness to left 5th finger, ongoing for a couple of weeks per pt, pt states that he has told his PCP-[place]

## 2011-11-17 NOTE — Discharge Instructions (Signed)
I suspect that the pain and numbness are related to disc problems in your neck. This could be from the site of your previous surgery. Please call your neurosurgeon for further evaluation. In the meantime, prescription has been given for Percocet continue pain relief.  Acetaminophen; Oxycodone tablets What is this medicine? ACETAMINOPHEN; OXYCODONE (a set a MEE noe fen; ox i KOE done) is a pain reliever. It is used to treat mild to moderate pain. This medicine may be used for other purposes; ask your health care provider or pharmacist if you have questions. What should I tell my health care provider before I take this medicine? They need to know if you have any of these conditions: -brain tumor -Crohn's disease, inflammatory bowel disease, or ulcerative colitis -drink more than 3 alcohol containing drinks per day -drug abuse or addiction -head injury -heart or circulation problems -kidney disease or problems going to the bathroom -liver disease -lung disease, asthma, or breathing problems -an unusual or allergic reaction to acetaminophen, oxycodone, other opioid analgesics, other medicines, foods, dyes, or preservatives -pregnant or trying to get pregnant -breast-feeding How should I use this medicine? Take this medicine by mouth with a full glass of water. Follow the directions on the prescription label. Take your medicine at regular intervals. Do not take your medicine more often than directed. Talk to your pediatrician regarding the use of this medicine in children. Special care may be needed. Patients over 5 years old may have a stronger reaction and need a smaller dose. Overdosage: If you think you have taken too much of this medicine contact a poison control center or emergency room at once. NOTE: This medicine is only for you. Do not share this medicine with others. What if I miss a dose? If you miss a dose, take it as soon as you can. If it is almost time for your next dose, take  only that dose. Do not take double or extra doses. What may interact with this medicine? -alcohol or medicines that contain alcohol -antihistamines -barbiturates like amobarbital, butalbital, butabarbital, methohexital, pentobarbital, phenobarbital, thiopental, and secobarbital -benztropine -drugs for bladder problems like solifenacin, trospium, oxybutynin, tolterodine, hyoscyamine, and methscopolamine -drugs for breathing problems like ipratropium and tiotropium -drugs for certain stomach or intestine problems like propantheline, homatropine methylbromide, glycopyrrolate, atropine, belladonna, and dicyclomine -general anesthetics like etomidate, ketamine, nitrous oxide, propofol, desflurane, enflurane, halothane, isoflurane, and sevoflurane -medicines for depression, anxiety, or psychotic disturbances -medicines for pain like codeine, morphine, pentazocine, buprenorphine, butorphanol, nalbuphine, tramadol, and propoxyphene -medicines for sleep -muscle relaxants -naltrexone -phenothiazines like perphenazine, thioridazine, chlorpromazine, mesoridazine, fluphenazine, prochlorperazine, promazine, and trifluoperazine -scopolamine -trihexyphenidyl This list may not describe all possible interactions. Give your health care provider a list of all the medicines, herbs, non-prescription drugs, or dietary supplements you use. Also tell them if you smoke, drink alcohol, or use illegal drugs. Some items may interact with your medicine. What should I watch for while using this medicine? Tell your doctor or health care professional if your pain does not go away, if it gets worse, or if you have new or a different type of pain. You may develop tolerance to the medicine. Tolerance means that you will need a higher dose of the medication for pain relief. Tolerance is normal and is expected if you take this medicine for a long time. Do not suddenly stop taking your medicine because you may develop a severe  reaction. Your body becomes used to the medicine. This does NOT mean you are addicted. Addiction  is a behavior related to getting and using a drug for a nonmedical reason. If you have pain, you have a medical reason to take pain medicine. Your doctor will tell you how much medicine to take. If your doctor wants you to stop the medicine, the dose will be slowly lowered over time to avoid any side effects. You may get drowsy or dizzy. Do not drive, use machinery, or do anything that needs mental alertness until you know how this medicine affects you. Do not stand or sit up quickly, especially if you are an older patient. This reduces the risk of dizzy or fainting spells. Alcohol may interfere with the effect of this medicine. Avoid alcoholic drinks. The medicine will cause constipation. Try to have a bowel movement at least every 2 to 3 days. If you do not have a bowel movement for 3 days, call your doctor or health care professional. Do not take Tylenol (acetaminophen) or medicines that have acetaminophen with this medicine. Too much acetaminophen can be very dangerous. Many nonprescription medicines contain acetaminophen. Always read the labels carefully to avoid taking more acetaminophen. What side effects may I notice from receiving this medicine? Side effects that you should report to your doctor or health care professional as soon as possible: -allergic reactions like skin rash, itching or hives, swelling of the face, lips, or tongue -breathing difficulties, wheezing -confusion -light headedness or fainting spells -severe stomach pain -yellowing of the skin or the whites of the eyes Side effects that usually do not require medical attention (report to your doctor or health care professional if they continue or are bothersome): -dizziness -drowsiness -nausea -vomiting This list may not describe all possible side effects. Call your doctor for medical advice about side effects. You may report side  effects to FDA at 1-800-FDA-1088. Where should I keep my medicine? Keep out of the reach of children. This medicine can be abused. Keep your medicine in a safe place to protect it from theft. Do not share this medicine with anyone. Selling or giving away this medicine is dangerous and against the law. Store at room temperature between 20 and 25 degrees C (68 and 77 degrees F). Keep container tightly closed. Protect from light. Flush any unused medicines down the toilet. Do not use the medicine after the expiration date. NOTE: This sheet is a summary. It may not cover all possible information. If you have questions about this medicine, talk to your doctor, pharmacist, or health care provider.  2012, [employer]. (07/28/2008 10:01:21 AM)

## 2011-11-17 NOTE — ED Notes (Signed)
Pt Dc to home with steady gait.

## 2011-12-05 ENCOUNTER — Encounter (HOSPITAL_COMMUNITY): Payer: Self-pay | Admitting: *Deleted

## 2011-12-05 ENCOUNTER — Emergency Department (HOSPITAL_COMMUNITY)
Admission: EM | Admit: 2011-12-05 | Discharge: 2011-12-05 | Disposition: A | Discharge status: Home / Self Care | Payer: Medicare Other | Attending: Emergency Medicine | Admitting: Emergency Medicine

## 2011-12-05 DIAGNOSIS — I10 Essential (primary) hypertension: Secondary | ICD-10-CM | POA: Insufficient documentation

## 2011-12-05 DIAGNOSIS — M1990 Unspecified osteoarthritis, unspecified site: Secondary | ICD-10-CM

## 2011-12-05 DIAGNOSIS — K219 Gastro-esophageal reflux disease without esophagitis: Secondary | ICD-10-CM | POA: Diagnosis not present

## 2011-12-05 DIAGNOSIS — R209 Unspecified disturbances of skin sensation: Secondary | ICD-10-CM | POA: Insufficient documentation

## 2011-12-05 DIAGNOSIS — F17200 Nicotine dependence, unspecified, uncomplicated: Secondary | ICD-10-CM | POA: Insufficient documentation

## 2011-12-05 DIAGNOSIS — J45909 Unspecified asthma, uncomplicated: Secondary | ICD-10-CM | POA: Diagnosis not present

## 2011-12-05 DIAGNOSIS — M5030 Other cervical disc degeneration, unspecified cervical region: Secondary | ICD-10-CM | POA: Diagnosis not present

## 2011-12-05 DIAGNOSIS — R202 Paresthesia of skin: Secondary | ICD-10-CM

## 2011-12-05 DIAGNOSIS — M25519 Pain in unspecified shoulder: Secondary | ICD-10-CM | POA: Insufficient documentation

## 2011-12-05 MED ORDER — DEXAMETHASONE 6 MG PO TABS: ORAL_TABLET | ORAL | Status: AC

## 2011-12-05 MED ORDER — LORAZEPAM 1 MG PO TABS: ORAL_TABLET | ORAL | Status: DC

## 2011-12-05 NOTE — ED Provider Notes (Signed)
History     CSN: [id number]  Arrival date & time 12/05/11  1238   None     Chief Complaint  Patient presents with  . Shoulder Pain    (Consider location/radiation/quality/duration/timing/severity/associated sxs/prior treatment) Patient is a 56 y.o. male presenting with shoulder pain. The history is provided by the patient.  Shoulder Pain This is a recurrent problem. The current episode started more than 1 month ago. The problem occurs daily. The problem has been gradually worsening. Associated symptoms include myalgias, neck pain and numbness. Pertinent negatives include no abdominal pain, arthralgias, change in bowel habit, chest pain or coughing. The symptoms are aggravated by nothing. He has tried nothing for the symptoms. The treatment provided no relief.    Past Medical History  Diagnosis Date  . Chronic pain     with leg and back pain (disc problem)  . History of upper GI x-ray series     to follow showed large duodenal ulcer H pylori serologies were negative  . Acid reflux   . Hypertension   . Asthma     Past Surgical History  Procedure Date  . Esophagogastroduodenoscopy 1/06    Distal esophageal erosions,U-shaped stomach,marked erosions and edema of the buld  . Back surgery 7/05; 5/09     Dr.Hirsch,3 lumbar  . Neck surgery 2/09  . Back surgery     No family history on file.  History  Substance Use Topics  . Smoking status: Current Everyday Smoker -- 0.5 packs/day    Types: Cigarettes  . Smokeless tobacco: Not on file  . Alcohol Use: No      Review of Systems  Constitutional: Negative for activity change.       All ROS Neg except as noted in HPI  HENT: Positive for neck pain. Negative for nosebleeds.   Eyes: Negative for photophobia and discharge.  Respiratory: Negative for cough, shortness of breath and wheezing.   Cardiovascular: Negative for chest pain and palpitations.  Gastrointestinal: Negative for abdominal pain, blood in stool and change in  bowel habit.  Genitourinary: Negative for dysuria, frequency and hematuria.  Musculoskeletal: Positive for myalgias. Negative for back pain and arthralgias.  Skin: Negative.   Neurological: Positive for numbness. Negative for dizziness, seizures and speech difficulty.  Psychiatric/Behavioral: Negative for hallucinations and confusion.    Allergies  Review of patient's allergies indicates no known allergies.  Home Medications   Current Outpatient Rx  Name Route Sig Dispense Refill  . ALBUTEROL SULFATE HFA 108 (90 BASE) MCG/ACT IN AERS Inhalation Inhale 2 puffs into the lungs every 6 (six) hours as needed. Asthma Symptoms     . ALBUTEROL SULFATE HFA 108 (90 BASE) MCG/ACT IN AERS Inhalation Inhale 2 puffs into the lungs every 6 (six) hours as needed for wheezing. 1 Inhaler 0  . AMLODIPINE BESYLATE 5 MG PO TABS Oral Take 5 mg by mouth daily.      Marland Kitchen ESOMEPRAZOLE MAGNESIUM 40 MG PO CPDR Oral Take 40 mg by mouth daily before breakfast.        BP 152/89  Pulse 95  Temp(Src) 98.9 F (37.2 C) (Oral)  Resp 18  Ht _0  (1.6 m)  Wt 126 lb (57.153 kg)  BMI 22.32 kg/m2  SpO2 97%  Physical Exam  Nursing note and vitals reviewed. Constitutional: He is oriented to person, place, and time. He appears well-developed and well-nourished.  Non-toxic appearance.  HENT:  Head: Normocephalic.  Right Ear: Tympanic membrane and external ear normal.  Left Ear: Tympanic  membrane and external ear normal.  Eyes: EOM and lids are normal. Pupils are equal, round, and reactive to light.  Neck: Normal range of motion. Neck supple. Carotid bruit is not present.  Cardiovascular: Normal rate, regular rhythm, normal heart sounds, intact distal pulses and normal pulses.   Pulmonary/Chest: Breath sounds normal. No respiratory distress.  Abdominal: Soft. Bowel sounds are normal. There is no tenderness. There is no guarding.  Musculoskeletal: Normal range of motion.       FROM of the upper extremities. No atrophy  of the thenar areas. Distal pulses wnl.  Lymphadenopathy:       Head (right side): No submandibular adenopathy present.       Head (left side): No submandibular adenopathy present.    He has no cervical adenopathy.  Neurological: He is alert and oriented to person, place, and time. He has normal strength. No cranial nerve deficit or sensory deficit.       Grip symmetrical. No sensory deficit.   Skin: Skin is warm and dry.  Psychiatric: He has a normal mood and affect. His speech is normal.    ED Course  Procedures (including critical care time)  Labs Reviewed - No data to display No results found.   No diagnosis found.    MDM  I have reviewed nursing notes, vital signs, and all appropriate lab and imaging results for this patient. Pt states he was seen by the neurosurgeon who performed the neck surgery. The surgeon told the patient that the pain and numbness of the upper extremities was "not" from the neck surg. No gross neuro deficit noted on exam today. Will add ativan at HS, and prednisone each AM. Suggested pt see Dr Merlene Laughter for neuro evaluation of the pain and numbness.       Lenox Ahr, [place] 12/11/11 2043

## 2011-12-05 NOTE — Discharge Instructions (Signed)
Please use dexamethasone each morning. Use ativan at bedtime for pain and numbness of the upper extremities. Please see Dr Merlene Laughter for neurological evaluation and nerve conduction studies.Paresthesia Paresthesia is a burning or prickling feeling. This feeling can happen in any part of the body. It often happens in the hands, arms, legs, or feet. HOME CARE  Avoid drinking alcohol.   Try massage or needle therapy (acupuncture) to help with your problems.   Keep all doctor visits as told.  GET HELP RIGHT AWAY IF:   You feel weak.   You have trouble walking or moving.   You have problems speaking or seeing.   You feel confused.   You cannot control when you poop (bowel movement) or pee (urinate).   You lose feeling (numbness) after an injury.   You pass out (faint).   Your burning or prickling feeling gets worse when you walk.   You have pain, cramps, or feel dizzy.   You have a rash.  MAKE SURE YOU:   Understand these instructions.   Will watch your condition.   Will get help right away if you are not doing well or get worse.  Document Released: 08/11/2008 Document Revised: 08/18/2011 Document Reviewed: 05/20/2011 [facility] Patient Information 2012 [name].

## 2011-12-05 NOTE — ED Notes (Signed)
Pt presents to er with c/o bilateral shoulder and arm pain. Pt states pain was worse during the night, has been seen here for same complaint recently and was told that the pain was coming from his previous neck surgery, pt has been seen by surgeon who did his neck surgery and was advised that pain is not coming from neck, pt states that he continues to experience pain. Pain is worse with movement.

## 2011-12-12 NOTE — ED Provider Notes (Signed)
Medical screening examination/treatment/procedure(s) were performed by non-physician practitioner and as supervising physician I was immediately available for consultation/collaboration.   Alfonzo Feller, DO 12/12/11 [phone]

## 2012-01-04 DIAGNOSIS — M5412 Radiculopathy, cervical region: Secondary | ICD-10-CM | POA: Diagnosis not present

## 2012-01-04 DIAGNOSIS — IMO0002 Reserved for concepts with insufficient information to code with codable children: Secondary | ICD-10-CM | POA: Diagnosis not present

## 2012-01-04 DIAGNOSIS — M1990 Unspecified osteoarthritis, unspecified site: Secondary | ICD-10-CM | POA: Diagnosis not present

## 2012-01-04 DIAGNOSIS — M25519 Pain in unspecified shoulder: Secondary | ICD-10-CM | POA: Diagnosis not present

## 2012-01-18 DIAGNOSIS — G5600 Carpal tunnel syndrome, unspecified upper limb: Secondary | ICD-10-CM | POA: Diagnosis not present

## 2012-01-18 DIAGNOSIS — M5412 Radiculopathy, cervical region: Secondary | ICD-10-CM | POA: Diagnosis not present

## 2012-01-23 DIAGNOSIS — IMO0002 Reserved for concepts with insufficient information to code with codable children: Secondary | ICD-10-CM | POA: Diagnosis not present

## 2012-01-23 DIAGNOSIS — G609 Hereditary and idiopathic neuropathy, unspecified: Secondary | ICD-10-CM | POA: Diagnosis not present

## 2012-02-20 ENCOUNTER — Other Ambulatory Visit: Payer: Self-pay | Admitting: Neurology

## 2012-02-20 DIAGNOSIS — M5412 Radiculopathy, cervical region: Secondary | ICD-10-CM | POA: Diagnosis not present

## 2012-02-20 DIAGNOSIS — IMO0002 Reserved for concepts with insufficient information to code with codable children: Secondary | ICD-10-CM | POA: Diagnosis not present

## 2012-02-20 DIAGNOSIS — G609 Hereditary and idiopathic neuropathy, unspecified: Secondary | ICD-10-CM | POA: Diagnosis not present

## 2012-02-23 ENCOUNTER — Other Ambulatory Visit: Payer: Self-pay | Admitting: Neurology

## 2012-02-23 DIAGNOSIS — M5412 Radiculopathy, cervical region: Secondary | ICD-10-CM

## 2012-03-03 ENCOUNTER — Ambulatory Visit
Admission: RE | Admit: 2012-03-03 | Discharge: 2012-03-03 | Disposition: A | Discharge status: Home / Self Care | Payer: Medicare Other | Source: Ambulatory Visit | Attending: Neurology | Admitting: Neurology

## 2012-03-03 DIAGNOSIS — M5020 Other cervical disc displacement, unspecified cervical region: Secondary | ICD-10-CM | POA: Diagnosis not present

## 2012-03-03 DIAGNOSIS — M5412 Radiculopathy, cervical region: Secondary | ICD-10-CM

## 2012-03-03 DIAGNOSIS — M25519 Pain in unspecified shoulder: Secondary | ICD-10-CM | POA: Diagnosis not present

## 2012-03-06 ENCOUNTER — Ambulatory Visit (HOSPITAL_COMMUNITY)
Admission: RE | Admit: 2012-03-06 | Discharge: 2012-03-06 | Disposition: A | Discharge status: Home / Self Care | Payer: Medicare Other | Source: Ambulatory Visit | Attending: Neurology | Admitting: Neurology

## 2012-03-06 ENCOUNTER — Other Ambulatory Visit: Payer: Self-pay | Admitting: Neurology

## 2012-03-06 DIAGNOSIS — M545 Low back pain, unspecified: Secondary | ICD-10-CM | POA: Diagnosis not present

## 2012-03-06 DIAGNOSIS — M5137 Other intervertebral disc degeneration, lumbosacral region: Secondary | ICD-10-CM | POA: Insufficient documentation

## 2012-03-06 DIAGNOSIS — M5412 Radiculopathy, cervical region: Secondary | ICD-10-CM | POA: Diagnosis not present

## 2012-03-06 DIAGNOSIS — M5380 Other specified dorsopathies, site unspecified: Secondary | ICD-10-CM | POA: Diagnosis not present

## 2012-03-06 DIAGNOSIS — M4712 Other spondylosis with myelopathy, cervical region: Secondary | ICD-10-CM | POA: Diagnosis not present

## 2012-03-06 DIAGNOSIS — M51379 Other intervertebral disc degeneration, lumbosacral region without mention of lumbar back pain or lower extremity pain: Secondary | ICD-10-CM | POA: Insufficient documentation

## 2012-03-22 DIAGNOSIS — IMO0002 Reserved for concepts with insufficient information to code with codable children: Secondary | ICD-10-CM | POA: Diagnosis not present

## 2012-03-22 DIAGNOSIS — M545 Low back pain: Secondary | ICD-10-CM | POA: Diagnosis not present

## 2012-04-06 ENCOUNTER — Encounter (HOSPITAL_COMMUNITY): Payer: Self-pay | Admitting: *Deleted

## 2012-04-06 ENCOUNTER — Emergency Department (HOSPITAL_COMMUNITY)
Admission: EM | Admit: 2012-04-06 | Discharge: 2012-04-06 | Disposition: A | Discharge status: Home / Self Care | Payer: Medicare Other | Attending: Emergency Medicine | Admitting: Emergency Medicine

## 2012-04-06 DIAGNOSIS — I10 Essential (primary) hypertension: Secondary | ICD-10-CM | POA: Diagnosis not present

## 2012-04-06 DIAGNOSIS — F17200 Nicotine dependence, unspecified, uncomplicated: Secondary | ICD-10-CM | POA: Insufficient documentation

## 2012-04-06 DIAGNOSIS — M545 Low back pain, unspecified: Secondary | ICD-10-CM

## 2012-04-06 DIAGNOSIS — G8929 Other chronic pain: Secondary | ICD-10-CM | POA: Diagnosis not present

## 2012-04-06 DIAGNOSIS — J45909 Unspecified asthma, uncomplicated: Secondary | ICD-10-CM | POA: Insufficient documentation

## 2012-04-06 DIAGNOSIS — K219 Gastro-esophageal reflux disease without esophagitis: Secondary | ICD-10-CM | POA: Insufficient documentation

## 2012-04-06 MED ORDER — HYDROCODONE-ACETAMINOPHEN 5-325 MG PO TABS: ORAL_TABLET | ORAL | Status: AC

## 2012-04-06 MED ORDER — METHOCARBAMOL 500 MG PO TABS: 1000.0000 mg | ORAL_TABLET | Freq: Four times a day (QID) | ORAL | Status: AC | PRN

## 2012-04-06 NOTE — ED Notes (Signed)
Pt c/o lower back pain radiating down his left leg since last night. Denies injury.

## 2012-04-06 NOTE — ED Provider Notes (Signed)
History     CSN: [id number]  Arrival date & time 04/06/12  0900   First MD Initiated Contact with Patient 04/06/12 [phone]      Chief Complaint  Patient presents with  . Back Pain    HPI Pt was seen at 0920.  Per pt, c/o gradual onset and persistence of constant acute flair of his chronic low back "pain" for the past several days.  Denies any change in his usual chronic pain pattern.  Pain worsens with palpation of the area and body position changes. Pt states he has run out of percocet and his PMD "won't give me any."  States he has an appt with Neuro Dr. Merlene Laughter in August "for an injection in my back."  Denies incont/retention of bowel or bladder, no saddle anesthesia, no focal motor weakness, no tingling/numbness in extremities, no fevers, no injury.   The symptoms have been associated with no other complaints. The patient has a significant history of similar symptoms previously, recently being evaluated for this complaint and multiple prior evals for same.     Past Medical History  Diagnosis Date  . Chronic pain     with leg and back pain (disc problem)  . History of upper GI x-ray series     to follow showed large duodenal ulcer H pylori serologies were negative  . Acid reflux   . Hypertension   . Asthma     Past Surgical History  Procedure Date  . Esophagogastroduodenoscopy 1/06    Distal esophageal erosions,U-shaped stomach,marked erosions and edema of the buld  . Back surgery 7/05; 5/09     Dr.Hirsch,3 lumbar  . Neck surgery 2/09  . Back surgery     History  Substance Use Topics  . Smoking status: Current Everyday Smoker -- 0.5 packs/day    Types: Cigarettes  . Smokeless tobacco: Not on file  . Alcohol Use: Yes    Review of Systems ROS: Statement: All systems negative except as marked or noted in the HPI; Constitutional: Negative for fever and chills. ; ; Eyes: Negative for eye pain, redness and discharge. ; ; ENMT: Negative for ear pain, hoarseness, nasal  congestion, sinus pressure and sore throat. ; ; Cardiovascular: Negative for chest pain, palpitations, diaphoresis, dyspnea and peripheral edema. ; ; Respiratory: Negative for cough, wheezing and stridor. ; ; Gastrointestinal: Negative for nausea, vomiting, diarrhea, abdominal pain, blood in stool, hematemesis, jaundice and rectal bleeding. . ; ; Genitourinary: Negative for dysuria, flank pain and hematuria. ; ; Musculoskeletal: +LBP. Negative for neck pain. Negative for swelling and trauma.; ; Skin: Negative for pruritus, rash, abrasions, blisters, bruising and skin lesion.; ; Neuro: Negative for headache, lightheadedness and neck stiffness. Negative for weakness, altered level of consciousness , altered mental status, extremity weakness, paresthesias, involuntary movement, seizure and syncope.     Allergies  Review of patient's allergies indicates no known allergies.  Home Medications   Current Outpatient Rx  Name Route Sig Dispense Refill  . ALBUTEROL SULFATE HFA 108 (90 BASE) MCG/ACT IN AERS Inhalation Inhale 2 puffs into the lungs every 6 (six) hours as needed. Asthma Symptoms     . ALBUTEROL SULFATE HFA 108 (90 BASE) MCG/ACT IN AERS Inhalation Inhale 2 puffs into the lungs every 6 (six) hours as needed for wheezing. 1 Inhaler 0  . AMLODIPINE BESYLATE 5 MG PO TABS Oral Take 5 mg by mouth daily.      Marland Kitchen ESOMEPRAZOLE MAGNESIUM 40 MG PO CPDR Oral Take 40 mg by mouth  daily before breakfast.      . LORAZEPAM 1 MG PO TABS  1 po at hs for pain and numbness. 10 tablet 0    BP 142/93  Pulse 77  Temp 98.2 F (36.8 C) (Oral)  Resp 16  Ht 5' 3" (1.6 m)  Wt 121 lb (54.885 kg)  BMI 21.43 kg/m2  SpO2 96%  Physical Exam 0925: Physical examination:  Nursing notes reviewed; Vital signs and O2 SAT reviewed;  Constitutional: Well developed, Well nourished, Well hydrated, In no acute distress; Head:  Normocephalic, atraumatic; Eyes: EOMI, PERRL, No scleral icterus; ENMT: Mouth and pharynx normal, Mucous  membranes moist; Neck: Supple, Full range of motion, No lymphadenopathy; Cardiovascular: Regular rate and rhythm, No murmur, rub, or gallop; Respiratory: Breath sounds clear & equal bilaterally, No rales, rhonchi, wheezes.  Speaking full sentences with ease, Normal respiratory effort/excursion; Chest: Nontender, Movement normal; Abdomen: Soft, Nontender, Nondistended, Normal bowel sounds; Genitourinary: No CVA tenderness; Spine:  No midline CS, TS, LS tenderness.  +TTP left lumbar paraspinal muscles.;; Extremities: Pulses normal, No tenderness, No edema, No calf edema or asymmetry.; Neuro: AA&Ox3, Major CN grossly intact.  Speech clear. Strength 5/5 equal bilat UE's and LE's, including great toe dorsiflexion.  DTR 2/4 equal bilat UE's and LE's.  No gross sensory deficits.  Neg straight leg raises bilat.  Gait steady.; Skin: Color normal, Warm, Dry.   ED Course  Procedures    MDM  MDM Reviewed: previous chart, nursing note and vitals     9:27 AM:  Long hx of chronic pain with multiple ED visits for same.  Pt endorses acute flair of his usual long standing chronic pain today, no change from his usual chronic pain pattern.  Pt encouraged to f/u with his PMD, Neurologist, and Pain Management doctor for good continuity of care and control of his chronic pain.  Verb understanding.          Alfonzo Feller, DO 04/06/12 Dionicia Abler

## 2012-04-16 DIAGNOSIS — M549 Dorsalgia, unspecified: Secondary | ICD-10-CM | POA: Diagnosis not present

## 2012-04-17 DIAGNOSIS — M79609 Pain in unspecified limb: Secondary | ICD-10-CM | POA: Diagnosis not present

## 2012-04-17 DIAGNOSIS — M47817 Spondylosis without myelopathy or radiculopathy, lumbosacral region: Secondary | ICD-10-CM | POA: Diagnosis not present

## 2012-04-17 DIAGNOSIS — M545 Low back pain, unspecified: Secondary | ICD-10-CM | POA: Diagnosis not present

## 2012-04-17 DIAGNOSIS — F17200 Nicotine dependence, unspecified, uncomplicated: Secondary | ICD-10-CM | POA: Diagnosis not present

## 2012-04-17 DIAGNOSIS — Z79899 Other long term (current) drug therapy: Secondary | ICD-10-CM | POA: Diagnosis not present

## 2012-05-01 DIAGNOSIS — M549 Dorsalgia, unspecified: Secondary | ICD-10-CM | POA: Diagnosis not present

## 2012-05-01 DIAGNOSIS — K219 Gastro-esophageal reflux disease without esophagitis: Secondary | ICD-10-CM | POA: Diagnosis not present

## 2012-05-22 ENCOUNTER — Encounter: Payer: Self-pay | Admitting: *Deleted

## 2012-06-22 DIAGNOSIS — K219 Gastro-esophageal reflux disease without esophagitis: Secondary | ICD-10-CM | POA: Diagnosis not present

## 2012-06-22 DIAGNOSIS — M549 Dorsalgia, unspecified: Secondary | ICD-10-CM | POA: Diagnosis not present

## 2012-07-29 ENCOUNTER — Encounter (HOSPITAL_COMMUNITY): Payer: Self-pay | Admitting: Emergency Medicine

## 2012-07-29 ENCOUNTER — Emergency Department (HOSPITAL_COMMUNITY)
Admission: EM | Admit: 2012-07-29 | Discharge: 2012-07-29 | Disposition: A | Discharge status: Home / Self Care | Payer: Medicare Other | Attending: Emergency Medicine | Admitting: Emergency Medicine

## 2012-07-29 ENCOUNTER — Emergency Department (HOSPITAL_COMMUNITY): Payer: Medicare Other

## 2012-07-29 DIAGNOSIS — J45909 Unspecified asthma, uncomplicated: Secondary | ICD-10-CM | POA: Insufficient documentation

## 2012-07-29 DIAGNOSIS — G8929 Other chronic pain: Secondary | ICD-10-CM | POA: Diagnosis not present

## 2012-07-29 DIAGNOSIS — M545 Low back pain, unspecified: Secondary | ICD-10-CM | POA: Insufficient documentation

## 2012-07-29 DIAGNOSIS — R52 Pain, unspecified: Secondary | ICD-10-CM | POA: Diagnosis not present

## 2012-07-29 DIAGNOSIS — F17200 Nicotine dependence, unspecified, uncomplicated: Secondary | ICD-10-CM | POA: Insufficient documentation

## 2012-07-29 DIAGNOSIS — M25559 Pain in unspecified hip: Secondary | ICD-10-CM | POA: Diagnosis not present

## 2012-07-29 DIAGNOSIS — Z79899 Other long term (current) drug therapy: Secondary | ICD-10-CM | POA: Diagnosis not present

## 2012-07-29 DIAGNOSIS — I10 Essential (primary) hypertension: Secondary | ICD-10-CM | POA: Diagnosis not present

## 2012-07-29 DIAGNOSIS — K219 Gastro-esophageal reflux disease without esophagitis: Secondary | ICD-10-CM | POA: Diagnosis not present

## 2012-07-29 DIAGNOSIS — M1710 Unilateral primary osteoarthritis, unspecified knee: Secondary | ICD-10-CM | POA: Diagnosis not present

## 2012-07-29 MED ORDER — OXYCODONE-ACETAMINOPHEN 5-325 MG PO TABS
1.0000 | ORAL_TABLET | Freq: Once | ORAL | Status: AC
  Administered 2012-07-29: 1 via ORAL
  Filled 2012-07-29: qty 1

## 2012-07-29 MED ORDER — OXYCODONE-ACETAMINOPHEN 5-325 MG PO TABS: 1.0000 | ORAL_TABLET | ORAL | Status: AC | PRN

## 2012-07-29 NOTE — ED Notes (Signed)
Pt stated sometimes feels like screws from his back surgeries are coming out of his hip, he also stated that he has spoken to dr. Aline Brochure about the same.

## 2012-07-29 NOTE — ED Notes (Signed)
Pt c/o left hip pain x one week. Pt denies any injury.

## 2012-07-29 NOTE — ED Notes (Signed)
Pt reports having left hip pain for the last week, denies any known injury to ext. Area more painful today

## 2012-07-30 NOTE — ED Provider Notes (Signed)
Medical screening examination/treatment/procedure(s) were performed by non-physician practitioner and as supervising physician I was immediately available for consultation/collaboration.   Maudry Diego, MD 07/30/12 [phone]

## 2012-07-30 NOTE — ED Provider Notes (Signed)
History     CSN: [id number]  Arrival date & time 07/29/12  9024   First MD Initiated Contact with Patient 07/29/12 1844      Chief Complaint  Patient presents with  . Hip Pain    (Consider location/radiation/quality/duration/timing/severity/associated sxs/prior treatment) HPI Comments: Bryan [name]  presents with acute on chronic low back pain which radiates into his left buttock and lateral hip.   Patient denies any new injury specifically.  There is no radiation into his lower extremity.  There has been no weakness or numbness in the lower extremities and no urinary or bowel retention or incontinence.  Patient does not have a history of cancer or IVDU.  He states he has chronic pain since his lumbar fusion which occurred approximately 5 years ago.  He states he feels like the screws from his surgery site are pressing into his left buttock area.  He is currently taking no medications for his symptoms.  It has been at least 6 months since he was last seen Dr. Aline Brochure but plans to make an appointment for recheck this week.   The history is provided by the patient.    Past Medical History  Diagnosis Date  . Chronic pain     with leg and back pain (disc problem)  . History of upper GI x-ray series     to follow showed large duodenal ulcer H pylori serologies were negative  . Acid reflux   . Hypertension   . Asthma     Past Surgical History  Procedure Date  . Esophagogastroduodenoscopy 1/06    Distal esophageal erosions,U-shaped stomach,marked erosions and edema of the buld  . Back surgery 7/05; 5/09     Dr.Hirsch,3 lumbar  . Neck surgery 2/09  . Back surgery     History reviewed. No pertinent family history.  History  Substance Use Topics  . Smoking status: Current Every Day Smoker -- 0.5 packs/day    Types: Cigarettes  . Smokeless tobacco: Not on file  . Alcohol Use: Yes      Review of Systems  Constitutional: Negative for fever.  Respiratory: Negative for  shortness of breath.   Cardiovascular: Negative for chest pain and leg swelling.  Gastrointestinal: Negative for abdominal pain, constipation and abdominal distention.  Genitourinary: Negative for dysuria, urgency, frequency, flank pain and difficulty urinating.  Musculoskeletal: Positive for back pain. Negative for joint swelling and gait problem.  Skin: Negative for rash.  Neurological: Negative for weakness and numbness.    Allergies  Review of patient's allergies indicates no known allergies.  Home Medications   Current Outpatient Rx  Name  Route  Sig  Dispense  Refill  . ESOMEPRAZOLE MAGNESIUM 40 MG PO CPDR   Oral   Take 40 mg by mouth daily before breakfast.           . ALBUTEROL SULFATE HFA 108 (90 BASE) MCG/ACT IN AERS   Inhalation   Inhale 2 puffs into the lungs every 6 (six) hours as needed. Shortness of breath         . OXYCODONE-ACETAMINOPHEN 5-325 MG PO TABS   Oral   Take 1 tablet by mouth every 4 (four) hours as needed for pain.   20 tablet   0     BP 128/92  Pulse 99  Temp 98.6 F (37 C) (Oral)  Resp 16  Ht _0  (1.6 m)  Wt 121 lb (54.885 kg)  BMI 21.43 kg/m2  SpO2 94%  Physical Exam  Nursing note and vitals reviewed. Constitutional: He appears well-developed and well-nourished.  HENT:  Head: Normocephalic.  Eyes: Conjunctivae normal are normal.  Neck: Normal range of motion. Neck supple.  Cardiovascular: Normal rate and intact distal pulses.        Pedal pulses normal.  Pulmonary/Chest: Effort normal.  Abdominal: Soft. Bowel sounds are normal. He exhibits no distension and no mass.  Musculoskeletal: Normal range of motion. He exhibits tenderness. He exhibits no edema.       Lumbar back: He exhibits tenderness. He exhibits no swelling, no edema and no spasm.       Left SI joint tender to palpation.  Neurological: He is alert. He has normal strength. He displays no atrophy and no tremor. No sensory deficit. Gait normal.  Reflex Scores:       Patellar reflexes are 2+ on the right side and 2+ on the left side.      Achilles reflexes are 2+ on the right side and 2+ on the left side.      No strength deficit noted in hip and knee flexor and extensor muscle groups.  Ankle flexion and extension intact.  Skin: Skin is warm and dry.  Psychiatric: He has a normal mood and affect.    ED Course  Procedures (including critical care time)  Labs Reviewed - No data to display Dg Hip Complete Left  07/29/2012  *RADIOLOGY REPORT*  Clinical Data: Hip pain  LEFT HIP - COMPLETE 2+ VIEW  Comparison: 01/18/2004  Findings: Minimal bilateral hip joint space narrowing. Degenerative and postsurgical changes lower lumbar spine. Osseous mineralization normal. No acute fracture, dislocation, or bone destruction.  IMPRESSION: Minimal degenerative changes of bilateral hip joints. Degenerative and postsurgical changes lower lumbar spine. No acute abnormalities.   Original Report Authenticated By: Lavonia Dana, M.D.      1. Chronic lumbar pain       MDM  X-rays reviewed with patient and his questions were answered.  He was prescribed Percocet.  Encouraged to followup with Dr. Aline Brochure this week as planned.  No neuro deficit on exam or by history to suggest emergent or surgical presentation.  Also discussed worsened sx that should prompt immediate re-evaluation including distal weakness, bowel/bladder retention/incontinence.              Evalee Jefferson, [place] 07/30/12 [phone]

## 2012-08-02 ENCOUNTER — Telehealth: Payer: Self-pay | Admitting: Orthopedic Surgery

## 2012-08-02 NOTE — Telephone Encounter (Signed)
Bryan [name] called this AM requesting an appointment for an [facility] follow-up from 07/29/12 for lower back hip pain.  He wanted to be seen today, and Dr. Aline Brochure does not have any available appointments.  Dr. Aline Brochure had previously referred Bryan [name] to Dr. Luiz Ochoa and he performed surgery on his back.  Bryan [name] said his pain now is coming from the surgeries he has had on his back.  I suggested he call Dr. Renold Genta office  And try to get in to see someone in that office as they have several providers there.

## 2012-08-02 NOTE — Telephone Encounter (Signed)
Perfect!

## 2012-08-22 ENCOUNTER — Emergency Department (HOSPITAL_COMMUNITY)
Admission: EM | Admit: 2012-08-22 | Discharge: 2012-08-22 | Disposition: A | Discharge status: Home / Self Care | Payer: Medicare Other | Attending: Emergency Medicine | Admitting: Emergency Medicine

## 2012-08-22 ENCOUNTER — Emergency Department (HOSPITAL_COMMUNITY): Payer: Medicare Other

## 2012-08-22 ENCOUNTER — Encounter (HOSPITAL_COMMUNITY): Payer: Self-pay | Admitting: Emergency Medicine

## 2012-08-22 DIAGNOSIS — R05 Cough: Secondary | ICD-10-CM | POA: Insufficient documentation

## 2012-08-22 DIAGNOSIS — R197 Diarrhea, unspecified: Secondary | ICD-10-CM | POA: Insufficient documentation

## 2012-08-22 DIAGNOSIS — B9789 Other viral agents as the cause of diseases classified elsewhere: Secondary | ICD-10-CM | POA: Insufficient documentation

## 2012-08-22 DIAGNOSIS — R059 Cough, unspecified: Secondary | ICD-10-CM | POA: Insufficient documentation

## 2012-08-22 DIAGNOSIS — I2789 Other specified pulmonary heart diseases: Secondary | ICD-10-CM | POA: Diagnosis not present

## 2012-08-22 DIAGNOSIS — J45909 Unspecified asthma, uncomplicated: Secondary | ICD-10-CM | POA: Diagnosis not present

## 2012-08-22 DIAGNOSIS — G8929 Other chronic pain: Secondary | ICD-10-CM | POA: Diagnosis not present

## 2012-08-22 DIAGNOSIS — K219 Gastro-esophageal reflux disease without esophagitis: Secondary | ICD-10-CM | POA: Insufficient documentation

## 2012-08-22 DIAGNOSIS — Z79899 Other long term (current) drug therapy: Secondary | ICD-10-CM | POA: Diagnosis not present

## 2012-08-22 DIAGNOSIS — B349 Viral infection, unspecified: Secondary | ICD-10-CM

## 2012-08-22 DIAGNOSIS — F17200 Nicotine dependence, unspecified, uncomplicated: Secondary | ICD-10-CM | POA: Insufficient documentation

## 2012-08-22 DIAGNOSIS — I10 Essential (primary) hypertension: Secondary | ICD-10-CM | POA: Insufficient documentation

## 2012-08-22 DIAGNOSIS — IMO0001 Reserved for inherently not codable concepts without codable children: Secondary | ICD-10-CM | POA: Diagnosis not present

## 2012-08-22 LAB — CBC WITH DIFFERENTIAL/PLATELET
Basophils Absolute: 0 10*3/uL (ref 0.0–0.1)
Eosinophils Relative: 0 % (ref 0–5)
Lymphocytes Relative: 16 % (ref 12–46)
Neutro Abs: 5.4 10*3/uL (ref 1.7–7.7)
Neutrophils Relative %: 70 % (ref 43–77)
Platelets: 220 10*3/uL (ref 150–400)
RDW: 13.7 % (ref 11.5–15.5)
WBC: 7.7 10*3/uL (ref 4.0–10.5)

## 2012-08-22 LAB — BASIC METABOLIC PANEL
CO2: 20 mEq/L (ref 19–32)
Calcium: 10.5 mg/dL (ref 8.4–10.5)
Chloride: 96 mEq/L (ref 96–112)
Potassium: 4.5 mEq/L (ref 3.5–5.1)
Sodium: 135 mEq/L (ref 135–145)

## 2012-08-22 LAB — HEPATIC FUNCTION PANEL
Albumin: 4.3 g/dL (ref 3.5–5.2)
Alkaline Phosphatase: 127 U/L — ABNORMAL HIGH (ref 39–117)
Total Protein: 9.5 g/dL — ABNORMAL HIGH (ref 6.0–8.3)

## 2012-08-22 MED ORDER — PROMETHAZINE HCL 25 MG PO TABS: 25.0000 mg | ORAL_TABLET | Freq: Four times a day (QID) | ORAL | Status: DC | PRN

## 2012-08-22 MED ORDER — ONDANSETRON HCL 4 MG/2ML IJ SOLN
4.0000 mg | Freq: Once | INTRAMUSCULAR | Status: AC
  Administered 2012-08-22: 4 mg via INTRAVENOUS
  Filled 2012-08-22: qty 2

## 2012-08-22 MED ORDER — TRAMADOL HCL 50 MG PO TABS: 50.0000 mg | ORAL_TABLET | Freq: Four times a day (QID) | ORAL | Status: DC | PRN

## 2012-08-22 MED ORDER — SODIUM CHLORIDE 0.9 % IV BOLUS (SEPSIS)
1000.0000 mL | Freq: Once | INTRAVENOUS | Status: AC
  Administered 2012-08-22: 1000 mL via INTRAVENOUS

## 2012-08-22 NOTE — ED Notes (Signed)
Pt c/o NVD, chills, and generalized body aches since Monday. Pt denies blood in stool or vomit. Pt states he has been taking OTC meds without relief.

## 2012-08-22 NOTE — ED Provider Notes (Signed)
History     CSN: [id number]  Arrival date & time 08/22/12  1219   First MD Initiated Contact with Patient 08/22/12 1424      Chief Complaint  Patient presents with  . Generalized Body Aches  . Emesis  . Diarrhea  . Cough    (Consider location/radiation/quality/duration/timing/severity/associated sxs/prior treatment) Patient is a 56 y.o. male presenting with vomiting. The history is provided by the patient (the pt complains of body aches and vomiting). No language interpreter was used.  Emesis  This is a new problem. The current episode started 2 days ago. The problem occurs 2 to 4 times per day. The problem has not changed since onset.The emesis has an appearance of stomach contents. There has been no fever. Pertinent negatives include no abdominal pain, no cough, no diarrhea and no headaches. Risk factors include ill contacts.    Past Medical History  Diagnosis Date  . Chronic pain     with leg and back pain (disc problem)  . History of upper GI x-ray series     to follow showed large duodenal ulcer H pylori serologies were negative  . Acid reflux   . Hypertension   . Asthma     Past Surgical History  Procedure Date  . Esophagogastroduodenoscopy 1/06    Distal esophageal erosions,U-shaped stomach,marked erosions and edema of the buld  . Back surgery 7/05; 5/09     Dr.Hirsch,3 lumbar  . Neck surgery 2/09  . Back surgery     History reviewed. No pertinent family history.  History  Substance Use Topics  . Smoking status: Current Every Day Smoker -- 0.5 packs/day    Types: Cigarettes  . Smokeless tobacco: Not on file  . Alcohol Use: No      Review of Systems  Constitutional: Negative for fatigue.  HENT: Negative for congestion, sinus pressure and ear discharge.   Eyes: Negative for discharge.  Respiratory: Negative for cough.   Cardiovascular: Negative for chest pain.  Gastrointestinal: Positive for vomiting. Negative for abdominal pain and diarrhea.   Genitourinary: Negative for frequency and hematuria.  Musculoskeletal: Negative for back pain.  Skin: Negative for rash.  Neurological: Negative for seizures and headaches.  Hematological: Negative.   Psychiatric/Behavioral: Negative for hallucinations.    Allergies  Review of patient's allergies indicates no known allergies.  Home Medications   Current Outpatient Rx  Name  Route  Sig  Dispense  Refill  . ESOMEPRAZOLE MAGNESIUM 40 MG PO CPDR   Oral   Take 40 mg by mouth daily before breakfast.           . ALBUTEROL SULFATE HFA 108 (90 BASE) MCG/ACT IN AERS   Inhalation   Inhale 2 puffs into the lungs every 6 (six) hours as needed. Shortness of breath         . PROMETHAZINE HCL 25 MG PO TABS   Oral   Take 1 tablet (25 mg total) by mouth every 6 (six) hours as needed for nausea.   15 tablet   0   . TRAMADOL HCL 50 MG PO TABS   Oral   Take 1 tablet (50 mg total) by mouth every 6 (six) hours as needed for pain.   20 tablet   0     BP 121/70  Pulse 78  Temp 98 F (36.7 C) (Oral)  Resp 20  Ht _0  (1.6 m)  Wt 120 lb (54.432 kg)  BMI 21.26 kg/m2  SpO2 91%  Physical Exam  Constitutional:  He is oriented to person, place, and time. He appears well-developed.  HENT:  Head: Normocephalic and atraumatic.  Eyes: Conjunctivae normal and EOM are normal. No scleral icterus.  Neck: Neck supple. No thyromegaly present.  Cardiovascular: Normal rate and regular rhythm.  Exam reveals no gallop and no friction rub.   No murmur heard. Pulmonary/Chest: No stridor. He has no wheezes. He has no rales. He exhibits no tenderness.  Abdominal: He exhibits no distension. There is no tenderness. There is no rebound.  Musculoskeletal: Normal range of motion. He exhibits no edema.  Lymphadenopathy:    He has no cervical adenopathy.  Neurological: He is oriented to person, place, and time. Coordination normal.  Skin: No rash noted. No erythema.  Psychiatric: He has a normal mood and  affect. His behavior is normal.    ED Course  Procedures (including critical care time)  Labs Reviewed  CBC WITH DIFFERENTIAL - Abnormal; Notable for the following:    RBC 6.18 (*)     Hemoglobin 19.5 (*)     HCT 54.8 (*)     Monocytes Relative 14 (*)     Monocytes Absolute 1.1 (*)     All other components within normal limits  BASIC METABOLIC PANEL - Abnormal; Notable for the following:    Glucose, Bld 103 (*)     BUN 27 (*)     Creatinine, Ser 1.81 (*)     GFR calc non Af Amer 40 (*)     GFR calc Af Amer 47 (*)     All other components within normal limits  HEPATIC FUNCTION PANEL - Abnormal; Notable for the following:    Total Protein 9.5 (*)     Alkaline Phosphatase 127 (*)     All other components within normal limits   Dg Chest 2 View  08/22/2012  *RADIOLOGY REPORT*  Clinical Data: Cough.  Nausea and vomiting.  Diarrhea.  Generalized body aches.  CHEST - 2 VIEW  Comparison: Portable chest x-rays 04/15/2011, 04/14/2011 [facility] and 02/07/2011 [facility]  Findings: Cardiac silhouette normal in size.  Enlarged central pulmonary arteries, unchanged.  Hyperinflation consistent with COPD/asthma, unchanged.  Lungs clear.  Bronchovascular markings normal.  Pulmonary vascularity normal.  No pneumothorax.  No pleural effusions.  Note is made of thickening of the folds of the gas-filled proximal stomach.  IMPRESSION:  1.  COPD/emphysema.  No acute cardiopulmonary disease. 2.  Enlarged central pulmonary arteries consistent with pulmonary arterial hypertension, unchanged. 3.  Thickening of the gastric folds in the gas-filled proximal stomach, consistent with gastritis or an infiltrating malignancy such as lymphoma.   Original Report Authenticated By: Evangeline Dakin, M.D.      1. Viral syndrome       MDM          Maudry Diego, MD 08/22/12 [phone]

## 2012-08-22 NOTE — ED Notes (Signed)
Pt c/o cough/n/v/d/feever/chills/body aches since Monday.

## 2012-09-20 DIAGNOSIS — J410 Simple chronic bronchitis: Secondary | ICD-10-CM | POA: Diagnosis not present

## 2012-09-20 DIAGNOSIS — K219 Gastro-esophageal reflux disease without esophagitis: Secondary | ICD-10-CM | POA: Diagnosis not present

## 2012-10-08 DIAGNOSIS — M545 Low back pain: Secondary | ICD-10-CM | POA: Diagnosis not present

## 2012-10-13 ENCOUNTER — Emergency Department (HOSPITAL_COMMUNITY)
Admission: EM | Admit: 2012-10-13 | Discharge: 2012-10-13 | Disposition: A | Discharge status: Home / Self Care | Payer: Medicare Other | Attending: Emergency Medicine | Admitting: Emergency Medicine

## 2012-10-13 ENCOUNTER — Encounter (HOSPITAL_COMMUNITY): Payer: Self-pay | Admitting: *Deleted

## 2012-10-13 DIAGNOSIS — J45909 Unspecified asthma, uncomplicated: Secondary | ICD-10-CM | POA: Diagnosis not present

## 2012-10-13 DIAGNOSIS — F17200 Nicotine dependence, unspecified, uncomplicated: Secondary | ICD-10-CM | POA: Diagnosis not present

## 2012-10-13 DIAGNOSIS — M545 Low back pain, unspecified: Secondary | ICD-10-CM | POA: Insufficient documentation

## 2012-10-13 DIAGNOSIS — K219 Gastro-esophageal reflux disease without esophagitis: Secondary | ICD-10-CM | POA: Insufficient documentation

## 2012-10-13 DIAGNOSIS — I10 Essential (primary) hypertension: Secondary | ICD-10-CM | POA: Diagnosis not present

## 2012-10-13 DIAGNOSIS — R6884 Jaw pain: Secondary | ICD-10-CM | POA: Diagnosis not present

## 2012-10-13 DIAGNOSIS — R220 Localized swelling, mass and lump, head: Secondary | ICD-10-CM | POA: Insufficient documentation

## 2012-10-13 DIAGNOSIS — Z8739 Personal history of other diseases of the musculoskeletal system and connective tissue: Secondary | ICD-10-CM | POA: Insufficient documentation

## 2012-10-13 DIAGNOSIS — Z79899 Other long term (current) drug therapy: Secondary | ICD-10-CM | POA: Insufficient documentation

## 2012-10-13 DIAGNOSIS — Z8719 Personal history of other diseases of the digestive system: Secondary | ICD-10-CM | POA: Diagnosis not present

## 2012-10-13 DIAGNOSIS — K089 Disorder of teeth and supporting structures, unspecified: Secondary | ICD-10-CM | POA: Diagnosis not present

## 2012-10-13 DIAGNOSIS — R062 Wheezing: Secondary | ICD-10-CM | POA: Diagnosis not present

## 2012-10-13 DIAGNOSIS — T85848A Pain due to other internal prosthetic devices, implants and grafts, initial encounter: Secondary | ICD-10-CM

## 2012-10-13 DIAGNOSIS — Z9889 Other specified postprocedural states: Secondary | ICD-10-CM | POA: Diagnosis not present

## 2012-10-13 DIAGNOSIS — M549 Dorsalgia, unspecified: Secondary | ICD-10-CM

## 2012-10-13 MED ORDER — HYDROCODONE-ACETAMINOPHEN 5-325 MG PO TABS: ORAL_TABLET | ORAL | Status: DC

## 2012-10-13 MED ORDER — AMOXICILLIN 500 MG PO CAPS: 500.0000 mg | ORAL_CAPSULE | Freq: Three times a day (TID) | ORAL | Status: DC

## 2012-10-13 NOTE — ED Notes (Signed)
Patient with no complaints at this time. Respirations even and unlabored. Skin warm/dry. Discharge instructions reviewed with patient at this time. Patient given opportunity to voice concerns/ask questions. Patient discharged at this time and left Emergency Department with steady gait.

## 2012-10-13 NOTE — ED Provider Notes (Signed)
History     CSN: [id number]  Arrival date & time 10/13/12  1632   First MD Initiated Contact with Patient 10/13/12 1656      Chief Complaint  Patient presents with  . Dental Pain  . Back Pain    (Consider location/radiation/quality/duration/timing/severity/associated sxs/prior treatment) Patient is a 57 y.o. male presenting with tooth pain. The history is provided by the patient.  Dental PainThe primary symptoms include mouth pain. Primary symptoms do not include shortness of breath or cough. The symptoms began 5 to 7 days ago. The symptoms are worsening. The symptoms are recurrent. The symptoms occur intermittently.  Additional symptoms include: gum swelling, gum tenderness and jaw pain. Additional symptoms do not include: nosebleeds. Medical issues include: smoking.    Past Medical History  Diagnosis Date  . Chronic pain     with leg and back pain (disc problem)  . History of upper GI x-ray series     to follow showed large duodenal ulcer H pylori serologies were negative  . Acid reflux   . Hypertension   . Asthma     Past Surgical History  Procedure Date  . Esophagogastroduodenoscopy 1/06    Distal esophageal erosions,U-shaped stomach,marked erosions and edema of the buld  . Back surgery 7/05; 5/09     Dr.Hirsch,3 lumbar  . Neck surgery 2/09  . Back surgery     No family history on file.  History  Substance Use Topics  . Smoking status: Current Every Day Smoker -- 0.5 packs/day    Types: Cigarettes  . Smokeless tobacco: Not on file  . Alcohol Use: No      Review of Systems  Constitutional: Negative for activity change.       All ROS Neg except as noted in HPI  HENT: Positive for dental problem. Negative for nosebleeds and neck pain.   Eyes: Negative for photophobia and discharge.  Respiratory: Positive for wheezing. Negative for cough and shortness of breath.   Cardiovascular: Negative for chest pain and palpitations.  Gastrointestinal: Negative for  abdominal pain and blood in stool.  Genitourinary: Negative for dysuria, frequency and hematuria.  Musculoskeletal: Positive for back pain and arthralgias.  Skin: Negative.   Neurological: Negative for dizziness, seizures and speech difficulty.  Psychiatric/Behavioral: Negative for hallucinations and confusion.    Allergies  Review of patient's allergies indicates no known allergies.  Home Medications   Current Outpatient Rx  Name  Route  Sig  Dispense  Refill  . ESOMEPRAZOLE MAGNESIUM 40 MG PO CPDR   Oral   Take 40 mg by mouth daily before breakfast.           . TRAMADOL HCL 50 MG PO TABS   Oral   Take 1 tablet (50 mg total) by mouth every 6 (six) hours as needed for pain.   20 tablet   0   . ALBUTEROL SULFATE HFA 108 (90 BASE) MCG/ACT IN AERS   Inhalation   Inhale 2 puffs into the lungs every 6 (six) hours as needed. Shortness of breath         . AMOXICILLIN 500 MG PO CAPS   Oral   Take 1 capsule (500 mg total) by mouth 3 (three) times daily.   21 capsule   0   . HYDROCODONE-ACETAMINOPHEN 5-325 MG PO TABS      1 or 2 po q4h prn pain   20 tablet   0     Please take with food     BP  150/95  Pulse 106  Temp 98.5 F (36.9 C) (Oral)  Resp 16  Ht 5' 3" (1.6 m)  Wt 120 lb (54.432 kg)  BMI 21.26 kg/m2  SpO2 97%  Physical Exam  Nursing note and vitals reviewed. Constitutional: He is oriented to person, place, and time. He appears well-developed and well-nourished.  Non-toxic appearance.  HENT:  Head: Normocephalic.  Right Ear: Tympanic membrane and external ear normal.  Left Ear: Tympanic membrane and external ear normal.       Upper gums swollen Pt has partial plates present. Gums are tender to palpation. Airway is patent. No swelling under the tongue.  Eyes: EOM and lids are normal. Pupils are equal, round, and reactive to light.  Neck: Normal range of motion. Neck supple. Carotid bruit is not present.  Cardiovascular: Normal rate, regular rhythm,  normal heart sounds, intact distal pulses and normal pulses.   Pulmonary/Chest: Breath sounds normal. No respiratory distress.  Abdominal: Soft. Bowel sounds are normal. There is no tenderness. There is no guarding.  Musculoskeletal: Normal range of motion.       Left flank soreness extending to the lower back with ROM or change of position.  Lymphadenopathy:       Head (right side): No submandibular adenopathy present.       Head (left side): No submandibular adenopathy present.    He has no cervical adenopathy.  Neurological: He is alert and oriented to person, place, and time. He has normal strength. No cranial nerve deficit or sensory deficit.  Skin: Skin is warm and dry.  Psychiatric: He has a normal mood and affect. His speech is normal.    ED Course  Procedures (including critical care time)  Labs Reviewed - No data to display No results found.   1. Dental implant pain   2. Back pain       MDM  I have reviewed nursing notes, vital signs, and all appropriate lab and imaging results for this patient. Pt has swelling of the gums, and pain of the left flank extending into the lower back. Rx for amoxil and norco given to the patient. Pt to see his dentist as soon as possible.       Lenox Ahr, [place] 10/13/12 [phone]

## 2012-10-13 NOTE — ED Notes (Signed)
Pt states he woke up with dental pain (gums) and intermittent headache today. Pt also states pain to left lower back that began yesterday.

## 2012-10-14 NOTE — ED Provider Notes (Signed)
Medical screening examination/treatment/procedure(s) were performed by non-physician practitioner and as supervising physician I was immediately available for consultation/collaboration.   Mervin Kung, MD 10/14/12 [phone]

## 2012-11-07 ENCOUNTER — Ambulatory Visit (HOSPITAL_COMMUNITY): Payer: Medicare Other | Attending: Neurosurgery | Admitting: Physical Therapy

## 2012-11-19 ENCOUNTER — Ambulatory Visit (HOSPITAL_COMMUNITY)
Admission: RE | Admit: 2012-11-19 | Discharge: 2012-11-19 | Disposition: A | Discharge status: Home / Self Care | Payer: Medicare Other | Source: Ambulatory Visit | Attending: Neurosurgery | Admitting: Neurosurgery

## 2012-11-19 DIAGNOSIS — M545 Low back pain, unspecified: Secondary | ICD-10-CM | POA: Insufficient documentation

## 2012-11-19 DIAGNOSIS — IMO0001 Reserved for inherently not codable concepts without codable children: Secondary | ICD-10-CM | POA: Insufficient documentation

## 2012-11-19 NOTE — Evaluation (Signed)
Physical Therapy Evaluation  Patient Details  Name: Bryan [name] MRN: [id number] Date of Birth: [birth year]  Today's Date: 11/19/2012 Time: 1515-1600 PT Time Calculation (min): 45 min Charges: 1 eval, 10' TE Visit#: 1 of 12  Re-eval: 12/19/12 Assessment Diagnosis: Low Back Pain Surgical Date:  (2008, 2009) Next MD Visit: Dr, Luiz Ochoa - unschedule Prior Therapy: conservative treatment  Authorization: Medicare  Authorization Time Period:    Authorization Visit#: 1 of 10   Past Medical History:  Past Medical History  Diagnosis Date  . Chronic pain     with leg and back pain (disc problem)  . History of upper GI x-ray series     to follow showed large duodenal ulcer H pylori serologies were negative  . Acid reflux   . Hypertension   . Asthma    Past Surgical History:  Past Surgical History  Procedure Laterality Date  . Esophagogastroduodenoscopy  1/06    Distal esophageal erosions,U-shaped stomach,marked erosions and edema of the buld  . Back surgery  7/05; 5/09     Dr.Hirsch,3 lumbar  . Neck surgery  2/09  . Back surgery     Subjective Symptoms/Limitations Pertinent History: Pt is referred to PT for chronic LBP that has been bothering him since 2008-09 when he he had an operation.  His c/co is pain, difficulty getting out of bed, difficulty standing, difficulty walking.  How long can you stand comfortably?: 1 hour How long can you walk comfortably?: 1/2 mile Pain Assessment Currently in Pain?: Yes Pain Score:   7 Pain Location: Back Pain Orientation: Right;Left;Lower Pain Type: Chronic pain Pain Relieving Factors: Taking Mobic without relief.   Prior Functioning  Home Living Lives With: Family Prior Function Vocation: On disability Comments: Would like to go out and play with his 76 year old grandson.   Cognition/Observation Observation/Other Assessments Observations: impaired flexibility to Both: hip flexors, quadricep, piriformis, hamstrings and quadriceps,  and gastroc.   Sensation/Coordination/Flexibility/Functional Tests Coordination Gross Motor Movements are Fluid and Coordinated: No Coordination and Movement Description: min cueng for TrA contraction, independent with PF and multifidus w/significant weakness to the musculature.   Assessment RLE AROM (degrees) RLE Overall AROM Comments: Hip IR: 30 degrees RLE Strength Right Hip Flexion: 5/5 Right Hip Extension: 5/5 Right Hip ABduction: 4/5 Right Knee Flexion: 5/5 Right Knee Extension: 5/5 LLE AROM (degrees) LLE Overall AROM Comments: Hip IR: 18 degrees LLE Strength Left Hip Flexion: 3+/5 Left Hip Extension: 3+/5 (painful) Left Hip ABduction: 4/5 Left Knee Flexion: 3+/5 Left Knee Extension: 3+/5 Lumbar AROM Lumbar Flexion: decreased 25% increased pain Lumbar Extension: decreased 50% Lumbar - Right Side Bend: WNL Lumbar - Left Side Bend: decreased 10% - pain Palpation Palpation: maximal fascial restrictions to L errector spinae, and piriformis   Mobility/Balance  Ambulation/Gait Ambulation/Gait: Yes Ambulation/Gait Assistance: 7: Independent Gait Pattern: Antalgic;Lateral hip instability Posture/Postural Control Posture/Postural Control: Postural limitations Postural Limitations: leans to the R side.  Static Standing Balance Single Leg Stance - Right Leg: 10 Single Leg Stance - Left Leg: 10 Tandem Stance - Right Leg: 10 Tandem Stance - Left Leg: 10 Rhomberg - Eyes Opened: 10 Rhomberg - Eyes Closed: 10   Exercise/Treatments Stretches Active Hamstring Stretch: 1 rep;30 seconds Hip Flexor Stretch: 1 rep;30 seconds (Thomas stretch) Piriformis Stretch: 1 rep;30 seconds (figure 4 in supine R knee bent) Seated Other Seated Lumbar Exercises: Heel Roll outs 3x10 sec holds Supine Ab Set: Limitations AB Set Limitations: 3 reps 10 sec holds Bridge: 10 reps Other Supine Lumbar Exercises:  PFC 3x10 sec holds Prone  Straight Leg Raise: 10 reps (Both) Other Prone Lumbar  Exercises: Both Hip IR x10 Other Prone Lumbar Exercises: Mutlifidus 3x10 sec holds  Physical Therapy Assessment and Plan PT Assessment and Plan Clinical Impression Statement: Pt has been referred to PT for chronic LBP to address following impairemtns listed below.  After evaluation it was found that he has approrpriate coordination to muscularture and is very weak and has significant weakness to LLE.  Pt will benefit from skilled therapeutic intervention in order to improve on the following deficits: Pain;Decreased strength;Impaired perceived functional ability;Impaired flexibility;Decreased range of motion Rehab Potential: Good PT Frequency: Min 3X/week PT Duration: 4 weeks PT Treatment/Interventions: Gait training;Functional mobility training;Therapeutic activities;Therapeutic exercise;Balance training;Neuromuscular re-education;Patient/family education;Manual techniques PT Plan: Continue with core strengthening (pilates) and stretching to decrease pain and improve stability, continue with educating pt on proper posture  manual techniques to decreaes lumbar pain and improve flexibiliy to hips.  Progress to t-bands, TM    Goals Home Exercise Program Pt will Perform Home Exercise Program: Independently PT Goal: Perform Home Exercise Program - Progress: Goal set today PT Short Term Goals Time to Complete Short Term Goals: 2 weeks PT Short Term Goal 1: Pt will report pain less than 5/10 for 50% of his day for improved QOL.  PT Short Term Goal 2: Pt will improve his L LE strength and core coordination by 1 muscle grade in order to tolerate standing for 1 hour with pain less than 5/10.   PT Short Term Goal 3: Pt will improve his core coordination to WNL in order to report decreased pain with transitional movements.  PT Long Term Goals Time to Complete Long Term Goals: 4 weeks PT Long Term Goal 1: Pt will report pain less than 3/10 for improved QOL.  PT Long Term Goal 2: Pt will improve LE  flexibility in order to sit for an hour with pain less than 3/10.  Long Term Goal 3: Pt will present with minimal fascial restrictions to errector spinae and piriformis muscualture to improve flexibility and decrease pain.  Long Term Goal 4: Pt will improve ODI to less than 20% for improved percieved functional ability.   Problem List Patient Active Problem List  Diagnosis  . ALCOHOL USE  . SMOKER  . GERD  . COLITIS  . SPONDYLOSIS, CERVICAL  . NECK PAIN  . Diarrhea  . ABDOMINAL PAIN, LEFT LOWER QUADRANT  . DUODENAL ULCER, HX OF  . Low back pain    PT - End of Session Activity Tolerance: Patient tolerated treatment well PT Plan of Care PT Home Exercise Plan: see scanned report PT Patient Instructions: discussed importance of core musculature, posture to decreae LBP, answered questions pt had concering his LBP.  Consulted and Agree with Plan of Care: Patient  GP Functional Assessment Tool Used: ODI: 36% Functional Limitation: Mobility: Walking and moving around Mobility: Walking and Moving Around Current Status ([id number]): At least 20 percent but less than 40 percent impaired, limited or restricted Mobility: Walking and Moving Around Goal Status [phone]): At least 1 percent but less than 20 percent impaired, limited or restricted  Kyal Arts, PT 11/19/2012, 5:20 PM  Physician Documentation Your signature is required to indicate approval of the treatment plan as stated above.  Please sign and either send electronically or make a copy of this report for your files and return this physician signed original.   Please mark one 1.__approve of plan  2. ___approve of plan with the following  conditions.   ______________________________                                                          _____________________ Physician Signature                                                                                                             Date

## 2012-11-22 ENCOUNTER — Ambulatory Visit (HOSPITAL_COMMUNITY): Payer: Medicare Other | Admitting: Physical Therapy

## 2012-11-23 ENCOUNTER — Ambulatory Visit (HOSPITAL_COMMUNITY)
Admission: RE | Admit: 2012-11-23 | Discharge: 2012-11-23 | Disposition: A | Discharge status: Home / Self Care | Payer: Medicare Other | Source: Ambulatory Visit | Attending: Neurosurgery | Admitting: Neurosurgery

## 2012-11-23 DIAGNOSIS — M545 Low back pain: Secondary | ICD-10-CM | POA: Diagnosis not present

## 2012-11-23 DIAGNOSIS — IMO0001 Reserved for inherently not codable concepts without codable children: Secondary | ICD-10-CM | POA: Diagnosis not present

## 2012-11-23 NOTE — Progress Notes (Signed)
Physical Therapy Treatment Patient Details  Name: Bryan [name] MRN: [id number] Date of Birth: [birth year]  Today's Date: 11/23/2012 Time: 7681-1572 PT Time Calculation (min): 41 min Charges: 33' TE, 8' Manual  Visit#: 2 of 12  Re-eval: 12/19/12    Authorization: Medicare  Authorization Time Period:    Authorization Visit#: 2 of 10   Subjective: Symptoms/Limitations Symptoms: Pt reports that he is doing really well this morning.  he states that he is working on the exercises and does not have questions as of now.  Feels the exercises are helping.  Pain Assessment Currently in Pain?: Yes Pain Score:   3 Pain Location: Back  Exercise/Treatments Stretches Active Hamstring Stretch: 3 reps;30 seconds (Both) Piriformis Stretch: 3 reps;30 seconds (Both) Aerobic Tread Mill: 5 minutes 1.5 mph for posture control Supine Ab Set: 5 reps;Limitations AB Set Limitations: 10 sec holds  Bent Knee Raise: 5 reps;1 second;Other (comment) (Both with ab set) Bridge: 15 reps Other Supine Lumbar Exercises: PFC 3x10 sec holds Quadruped Straight Leg Raise: 10 reps;Other (comment) (both) Other Quadruped Lumbar Exercises: quadricep stretch and hip internal rotation stretch: Both 3x30 sec holds  Physical Therapy Assessment and Plan PT Assessment and Plan Clinical Impression Statement: Pt continues to be independent with core activiation and advanced core activities to include dynamic lower extremity movments.  has decreased muscular spasms after manual treatment today. Added treadmill at end of session to even stride length and demonstrate appropriate posture.  Requies minimal cueing for verbalization of proper posture.  PT Treatment/Interventions: Gait training;Functional mobility training;Therapeutic activities;Therapeutic exercise;Balance training;Neuromuscular re-education;Patient/family education;Manual techniques PT Plan: Continue to progress core activities.  Add theraband activities    Goals     Problem List Patient Active Problem List  Diagnosis  . ALCOHOL USE  . SMOKER  . GERD  . COLITIS  . SPONDYLOSIS, CERVICAL  . NECK PAIN  . Diarrhea  . ABDOMINAL PAIN, LEFT LOWER QUADRANT  . DUODENAL ULCER, HX OF  . Low back pain    PT - End of Session Activity Tolerance: Patient tolerated treatment well PT Plan of Care PT Patient Instructions: discussed posture  GP Functional Assessment Tool Used: ODI: 36%  Cuahutemoc Attar, PT 11/23/2012, 9:26 AM

## 2012-11-26 ENCOUNTER — Ambulatory Visit (HOSPITAL_COMMUNITY): Payer: Medicare Other | Admitting: Physical Therapy

## 2012-11-28 ENCOUNTER — Ambulatory Visit (HOSPITAL_COMMUNITY)
Admission: RE | Admit: 2012-11-28 | Discharge: 2012-11-28 | Disposition: A | Discharge status: Home / Self Care | Payer: Medicare Other | Source: Ambulatory Visit | Attending: Neurosurgery | Admitting: Neurosurgery

## 2012-11-28 DIAGNOSIS — M545 Low back pain: Secondary | ICD-10-CM | POA: Diagnosis not present

## 2012-11-28 DIAGNOSIS — IMO0001 Reserved for inherently not codable concepts without codable children: Secondary | ICD-10-CM | POA: Diagnosis not present

## 2012-11-28 NOTE — Progress Notes (Signed)
Physical Therapy Treatment Patient Details  Name: Bryan [name] MRN: [id number] Date of Birth: 15-Jan-1956  Today's Date: 11/28/2012 Time: 0511-0211 PT Time Calculation (min): 34 min Charges: 36' TE Visit#: 3 of 12  Re-eval: 12/19/12    Authorization: Medicare  Authorization Time Period:    Authorization Visit#: 3 of 10   Subjective: Symptoms/Limitations Symptoms: Pt reports that his pain at night is a 3/10.  He is doing his exercises and this morning is feeling really well.  He continues to have greatest difficulty for standing for a long peroid of time. States greater ease with walking to the grocery store.  Pain Assessment Currently in Pain?: Yes Pain Score:   3 Pain Location: Back Pain Orientation: Right;Left;Lower  Precautions/Restrictions     Exercise/Treatments Mobility/Balance        Stretches Active Hamstring Stretch: 3 reps;30 seconds (Both) Quadruped Mid Back Stretch: 3 reps;20 seconds Aerobic Tread Mill: 8 minutes 2.0 mph for posture control Machines for Strengthening   Standing Scapular Retraction: Both;10 reps;Theraband Theraband Level (Scapular Retraction): Level 3 (Green) Row: Both;10 reps;Theraband Theraband Level (Row): Level 3 (Green) Shoulder Extension: Both;10 reps;Theraband Theraband Level (Shoulder Extension): Level 3 (Green) Seated Other Seated Lumbar Exercises: Heel Roll outs 10x10 sec holds Supine Bent Knee Raise: 10 reps Sidelying   Prone  Other Prone Lumbar Exercises: Mutlifidus 10x10 sec holds Quadruped       Physical Therapy Assessment and Plan PT Assessment and Plan Clinical Impression Statement: Pt able to independently verbalize importance of posture and appropriate muscles to use while standing. Added therband activities in order to strengthening erector spinae musculature w/min cueing for transverse abdominus activiation.  PT Plan: Continue to progress core activities.  Add theraband activities    Goals Home Exercise  Program Pt will Perform Home Exercise Program: Independently PT Goal: Perform Home Exercise Program - Progress: Met PT Short Term Goals PT Short Term Goal 1: Pt will report pain less than 5/10 for 50% of his day for improved QOL.  PT Short Term Goal 1 - Progress: Met PT Short Term Goal 2: Pt will improve his L LE strength and core coordination by 1 muscle grade in order to tolerate standing for 1 hour with pain less than 5/10.   (30 minutes) PT Short Term Goal 2 - Progress: Partly met PT Short Term Goal 3: Pt will improve his core coordination to WNL in order to report decreased pain with transitional movements.  PT Short Term Goal 3 - Progress: Met PT Long Term Goals PT Long Term Goal 1: Pt will report pain less than 3/10 for improved QOL.  PT Long Term Goal 1 - Progress: Met PT Long Term Goal 2: Pt will improve LE flexibility in order to sit for an hour with pain less than 3/10.  (1 hour) PT Long Term Goal 2 - Progress: Met Long Term Goal 3: Pt will present with minimal fascial restrictions to errector spinae and piriformis muscualture to improve flexibility and decrease pain.  Long Term Goal 3 Progress: Met Long Term Goal 4: Pt will improve ODI to less than 20% for improved percieved functional ability.  Long Term Goal 4 Progress: Progressing toward goal  Problem List Patient Active Problem List  Diagnosis  . ALCOHOL USE  . SMOKER  . GERD  . COLITIS  . SPONDYLOSIS, CERVICAL  . NECK PAIN  . Diarrhea  . ABDOMINAL PAIN, LEFT LOWER QUADRANT  . DUODENAL ULCER, HX OF  . Low back pain    PT -  End of Session Activity Tolerance: Patient tolerated treatment well PT Plan of Care PT Patient Instructions: discussed core activation when standing, pt independent with demonstration and verbaliization of proper posture.  Consulted and Agree with Plan of Care: Patient  GP Functional Assessment Tool Used: ODI: 36%  Bryan [name] 11/28/2012, 9:26 AM

## 2012-11-30 ENCOUNTER — Ambulatory Visit (HOSPITAL_COMMUNITY)
Admission: RE | Admit: 2012-11-30 | Discharge: 2012-11-30 | Disposition: A | Discharge status: Home / Self Care | Payer: Medicare Other | Source: Ambulatory Visit | Attending: Neurosurgery | Admitting: Neurosurgery

## 2012-11-30 DIAGNOSIS — IMO0001 Reserved for inherently not codable concepts without codable children: Secondary | ICD-10-CM | POA: Diagnosis not present

## 2012-11-30 DIAGNOSIS — M545 Low back pain: Secondary | ICD-10-CM | POA: Diagnosis not present

## 2012-11-30 NOTE — Progress Notes (Signed)
Physical Therapy Treatment Patient Details  Name: Bryan [name] MRN: [id number] Date of Birth: 1956/05/05  Today's Date: 11/30/2012 Time: 2202-5427 PT Time Calculation (min): 46 min Charge: therex 35', gait 10'  Visit#: 4 of 12  Re-eval: 12/19/12    Authorization: Medicare  Authorization Time Period:    Authorization Visit#: 4 of 10   Subjective: Symptoms/Limitations Symptoms: Pt stated pain free this session. Pain Assessment Currently in Pain?: No/denies  Objective:   Exercise/Treatments Stretches Active Hamstring Stretch: 3 reps;30 seconds Aerobic Tread Mill: 10 minutes 2.0 mph for posture control Standing Functional Squats: 5 reps;Limitations Functional Squats Limitations: chair pose 5x 10" Scapular Retraction: 15 reps;Theraband;Limitations Theraband Level (Scapular Retraction): Level 3 (Green) Scapular Retraction Limitations: HEP Row: 15 reps;Theraband;Limitations Theraband Level (Row): Level 3 (Green) Row Limitations: HEP Shoulder Extension: 15 reps;Theraband;Limitations Theraband Level (Shoulder Extension): Level 3 (Green) Shoulder Extension Limitations: HEP Supine Bent Knee Raise: 10 reps;5 seconds Other Supine Lumbar Exercises: PFC 12x10 sec holds Prone  Other Prone Lumbar Exercises: Mutlifidus 10x10 sec holds  Physical Therapy Assessment and Plan PT Assessment and Plan Clinical Impression Statement: Pt able to demonstrate appropriate form and technique with theraband with min cueing for transverse abdominus activation.  Pt given theraband and worksheet to add to HEP.  Added chair pose for LE strengthening with visible musculature fatigue.  Pt improving pelvic floor contraction, min tactile cueing required to activation.  Pt able to complete therex with no c/o pain PT Plan: Continue to progress core activities.     Goals    Problem List Patient Active Problem List  Diagnosis  . ALCOHOL USE  . SMOKER  . GERD  . COLITIS  . SPONDYLOSIS, CERVICAL  .  NECK PAIN  . Diarrhea  . ABDOMINAL PAIN, LEFT LOWER QUADRANT  . DUODENAL ULCER, HX OF  . Low back pain    PT - End of Session Activity Tolerance: Patient tolerated treatment well General Behavior During Session: [name] for tasks performed Cognition: [name] for tasks performed  GP    Aldona Lento 11/30/2012, 9:38 AM

## 2012-12-03 ENCOUNTER — Ambulatory Visit (HOSPITAL_COMMUNITY)
Admission: RE | Admit: 2012-12-03 | Discharge: 2012-12-03 | Disposition: A | Discharge status: Home / Self Care | Payer: Medicare Other | Source: Ambulatory Visit | Attending: Internal Medicine | Admitting: Internal Medicine

## 2012-12-03 DIAGNOSIS — M545 Low back pain: Secondary | ICD-10-CM | POA: Diagnosis not present

## 2012-12-03 DIAGNOSIS — IMO0001 Reserved for inherently not codable concepts without codable children: Secondary | ICD-10-CM | POA: Diagnosis not present

## 2012-12-03 NOTE — Evaluation (Addendum)
Physical Therapy Discharge and Treatment  Patient Details  Name: Bryan [name] MRN: [id number] Date of Birth: [birth year]  Today's Date: 12/03/2012 Time: 6734-1937 PT Time Calculation (min): 43 min Charges: 1 ROM, 1 MMT, 32' TE             Visit#: 5 of 12  Re-eval: 12/19/12 Assessment Diagnosis: Low Back Pain Next MD Visit: Dr, Luiz Ochoa - unschedule  Authorization: Medicare    Authorization Time Period:    Authorization Visit#: 5 of 10   Subjective Symptoms/Limitations Symptoms: Pt reports that he is standing without difficulty, he continues to walk without pain.  He is doing his exercises everyday. Pain Assessment Currently in Pain?: No/denies  Sensation/Coordination/Flexibility/Functional Tests Functional Tests Functional Tests: Oswestry Disability Index 9ODI): 16%  RLE AROM (degrees) RLE Overall AROM Comments: Hip IR:  30 degrees (Hip IR: 30 degrees)  RLE Strength Right Hip Flexion: 5/5 (was 5/5) Right Hip Extension: 5/5 (was 5/5) Right Hip ABduction: 5/5 (was 4/5) Right Knee Flexion: 5/5 (was 5/5) Right Knee Extension: 5/5 (was 5/5)  LLE AROM (degrees) LLE Overall AROM Comments: Hip IR: 24 degrees (Hip IR: 18 degrees)  LLE Strength Left Hip Flexion:  (4+/5, was 3+/5) Left Hip Extension:  (4+/5 mild pain, was 3+/5 w/pain) Left Hip ABduction: 5/5 (was 4/5) Left Knee Flexion:  (4+/5, was 3+/5) Left Knee Extension:  (4+/5, was 3+/5)  Lumbar AROM Lumbar Flexion: decreased 25% - mild pain (was decreased 25% increased pain) Lumbar Extension: WNL - no pain (was decreased 50%) Lumbar - Right Side Bend: WNL (was WNL) Lumbar - Left Side Bend: WNL - no pian (was  decreased 10% - pain)  Mobility/Balance  Ambulation/Gait Ambulation/Gait Assistance: 7: Independent Gait Pattern: Within Functional Limits   Exercise/Treatments Stretches Active Hamstring Stretch: 3 reps;30 seconds Piriformis Stretch: 3 reps;30 seconds (supine figure 4 both) Aerobic Tread Mill: 8 minutes  2.0 mph for posture control Supine Clam: 10 reps Bridge: 5 reps;Limitations Bridge Limitations: w/single leg raise Prone  Other Prone Lumbar Exercises: Both Hip IR x10 Other Quadruped Lumbar Exercises: quadricep stretch Both 3x30 sec holds  Physical Therapy Assessment and Plan PT Assessment and Plan Clinical Impression Statement: Mr. Allman has attended 5 OP PT visits to address back pain with the following findings: his pain has been reduced significantly during the day and at night (3/10 at night), he has been educated and is independent with his HEP.  Updated HEP to advanced activities. At this time will d/c pt from PT secondary to all goals met and all educated recieved.  PT Plan: D/C    Goals Home Exercise Program Pt will Perform Home Exercise Program: Independently: Met PT Short Term Goals: 2 weeks PT Short Term Goal 1: Pt will report pain less than 5/10 for 50% of his day for improved QOL.  (3/10 at night): Met PT Short Term Goal 2: Pt will improve his L LE strength and core coordination by 1 muscle grade in order to tolerate standing for 1 hour with pain less than 5/10.   (1 hour): Met PT Short Term Goal 3: Pt will improve his core coordination to WNL in order to report decreased pain with transitional movements. : Met PT Long Term Goals: 4 weeks PT Long Term Goal 1: Pt will report pain less than 3/10 for improved QOL.: Met PT Long Term Goal 2: Pt will improve LE flexibility in order to sit for an hour with pain less than 3/10.  (1 hour, limited hamstring fliexibity): Partly met Long Term  Goal 3: Pt will present with minimal fascial restrictions to errector spinae and piriformis muscualture to improve flexibility and decrease pain: Met Long Term Goal 4: Pt will improve ODI to less than 20% for improved percieved functional ability.  (16%): Met  Problem List Patient Active Problem List  Diagnosis  . ALCOHOL USE  . SMOKER  . GERD  . COLITIS  . SPONDYLOSIS, CERVICAL  . NECK  PAIN  . Diarrhea  . ABDOMINAL PAIN, LEFT LOWER QUADRANT  . DUODENAL ULCER, HX OF  . Low back pain   PT - End of Session Activity Tolerance: Patient tolerated treatment well General Behavior During Session: [facility] for tasks performed Cognition: [facility] for tasks performed PT Plan of Care PT Patient Instructions: discussed how to advance HEP, continueing with HEP especially to improve fleixbility, answered remainiing questions.  Consulted and Agree with Plan of Care: Patient  GP Functional Assessment Tool Used: ODI: 16% Functional Limitation: Mobility: Walking and moving around Mobility: Walking and Moving Around Goal Status [phone]): At least 1 percent but less than 20 percent impaired, limited or restricted Mobility: Walking and Moving Around Discharge Status [phone]): At least 1 percent but less than 20 percent impaired, limited or restricted  Treniece Holsclaw, PT 12/03/2012, 9:28 AM  Physician Documentation Your signature is required to indicate approval of the treatment plan as stated above.  Please sign and either send electronically or make a copy of this report for your files and return this physician signed original.   Please mark one 1.__approve of plan  2. ___approve of plan with the following conditions.   ______________________________                                                          _____________________ Physician Signature                                                                                                             Date

## 2012-12-05 ENCOUNTER — Ambulatory Visit (HOSPITAL_COMMUNITY): Payer: Medicare Other | Admitting: Physical Therapy

## 2012-12-07 ENCOUNTER — Ambulatory Visit (HOSPITAL_COMMUNITY): Payer: Medicare Other

## 2012-12-20 ENCOUNTER — Other Ambulatory Visit (HOSPITAL_COMMUNITY): Payer: Self-pay | Admitting: Internal Medicine

## 2012-12-20 ENCOUNTER — Ambulatory Visit (HOSPITAL_COMMUNITY)
Admission: RE | Admit: 2012-12-20 | Discharge: 2012-12-20 | Disposition: A | Discharge status: Home / Self Care | Payer: Medicare Other | Source: Ambulatory Visit | Attending: Internal Medicine | Admitting: Internal Medicine

## 2012-12-20 DIAGNOSIS — Z87891 Personal history of nicotine dependence: Secondary | ICD-10-CM | POA: Insufficient documentation

## 2012-12-20 DIAGNOSIS — R0602 Shortness of breath: Secondary | ICD-10-CM | POA: Insufficient documentation

## 2012-12-20 DIAGNOSIS — J40 Bronchitis, not specified as acute or chronic: Secondary | ICD-10-CM

## 2012-12-20 DIAGNOSIS — J410 Simple chronic bronchitis: Secondary | ICD-10-CM | POA: Diagnosis not present

## 2012-12-20 DIAGNOSIS — I10 Essential (primary) hypertension: Secondary | ICD-10-CM | POA: Diagnosis not present

## 2012-12-20 DIAGNOSIS — J449 Chronic obstructive pulmonary disease, unspecified: Secondary | ICD-10-CM | POA: Diagnosis not present

## 2012-12-27 DIAGNOSIS — G8929 Other chronic pain: Secondary | ICD-10-CM | POA: Diagnosis not present

## 2012-12-27 DIAGNOSIS — M549 Dorsalgia, unspecified: Secondary | ICD-10-CM | POA: Diagnosis not present

## 2012-12-27 DIAGNOSIS — J449 Chronic obstructive pulmonary disease, unspecified: Secondary | ICD-10-CM | POA: Diagnosis not present

## 2013-01-14 DIAGNOSIS — M79609 Pain in unspecified limb: Secondary | ICD-10-CM | POA: Diagnosis not present

## 2013-01-14 DIAGNOSIS — M5137 Other intervertebral disc degeneration, lumbosacral region: Secondary | ICD-10-CM | POA: Diagnosis not present

## 2013-01-14 DIAGNOSIS — Z79899 Other long term (current) drug therapy: Secondary | ICD-10-CM | POA: Diagnosis not present

## 2013-01-14 DIAGNOSIS — G894 Chronic pain syndrome: Secondary | ICD-10-CM | POA: Diagnosis not present

## 2013-01-30 ENCOUNTER — Other Ambulatory Visit: Payer: Self-pay | Admitting: Pain Medicine

## 2013-01-30 DIAGNOSIS — M542 Cervicalgia: Secondary | ICD-10-CM

## 2013-01-30 DIAGNOSIS — M545 Low back pain: Secondary | ICD-10-CM

## 2013-02-03 ENCOUNTER — Ambulatory Visit
Admission: RE | Admit: 2013-02-03 | Discharge: 2013-02-03 | Disposition: A | Discharge status: Home / Self Care | Payer: Medicare Other | Source: Ambulatory Visit | Attending: Pain Medicine | Admitting: Pain Medicine

## 2013-02-03 DIAGNOSIS — M5020 Other cervical disc displacement, unspecified cervical region: Secondary | ICD-10-CM | POA: Diagnosis not present

## 2013-02-03 DIAGNOSIS — M5126 Other intervertebral disc displacement, lumbar region: Secondary | ICD-10-CM | POA: Diagnosis not present

## 2013-02-03 DIAGNOSIS — M545 Low back pain: Secondary | ICD-10-CM

## 2013-02-03 DIAGNOSIS — M542 Cervicalgia: Secondary | ICD-10-CM

## 2013-02-03 MED ORDER — GADOBENATE DIMEGLUMINE 529 MG/ML IV SOLN
10.0000 mL | Freq: Once | INTRAVENOUS | Status: AC | PRN
  Administered 2013-02-03: 10 mL via INTRAVENOUS

## 2013-02-13 DIAGNOSIS — G5790 Unspecified mononeuropathy of unspecified lower limb: Secondary | ICD-10-CM | POA: Diagnosis not present

## 2013-02-13 DIAGNOSIS — M5137 Other intervertebral disc degeneration, lumbosacral region: Secondary | ICD-10-CM | POA: Diagnosis not present

## 2013-02-13 DIAGNOSIS — G894 Chronic pain syndrome: Secondary | ICD-10-CM | POA: Diagnosis not present

## 2013-02-13 DIAGNOSIS — Z79899 Other long term (current) drug therapy: Secondary | ICD-10-CM | POA: Diagnosis not present

## 2013-02-20 DIAGNOSIS — J449 Chronic obstructive pulmonary disease, unspecified: Secondary | ICD-10-CM | POA: Diagnosis not present

## 2013-02-22 ENCOUNTER — Encounter: Payer: Self-pay | Admitting: Pulmonary Disease

## 2013-02-22 ENCOUNTER — Ambulatory Visit (INDEPENDENT_AMBULATORY_CARE_PROVIDER_SITE_OTHER): Payer: Medicare Other | Admitting: Pulmonary Disease

## 2013-02-22 VITALS — BP 156/72 | HR 106 | Temp 98.5°F | Ht 63.0 in | Wt 124.9 lb

## 2013-02-22 DIAGNOSIS — J449 Chronic obstructive pulmonary disease, unspecified: Secondary | ICD-10-CM | POA: Diagnosis not present

## 2013-02-22 MED ORDER — NICOTINE 10 MG IN INHA: 1.0000 | RESPIRATORY_TRACT | Status: DC | PRN

## 2013-02-22 NOTE — Patient Instructions (Addendum)
You have severe COPD You have to quit smoking Forms will be filled out Nicotrol inhaler Pulm rehab referral

## 2013-02-22 NOTE — Assessment & Plan Note (Signed)
severe COPD - one effort was 37% but most efforts are 25-30% range You have to quit smoking Forms will be filled out Nicotrol inhaler Pulm rehab referral

## 2013-02-22 NOTE — Progress Notes (Signed)
Subjective:    Patient ID: Bryan [name], male    DOB: November 28, 1955, 57 y.o.   MRN: [id number]  HPI PCP - fanta  57 year old heavy smoker referred for evaluation of COPD. He had a DWI in 6295 and lost his license, he has not driven in over 20 years and is now renewing his drivers license. He was not able to blow into the ignition interlock and was referred to a pulmonologist to fill out the waiver form. Happily this form has to referred by 2 physicians. He was seen by Dr. Luan Pulling in [place] who certified that he has severe COPD. Spirometry showed an FEV1 of 0.58 - 21% with FEV1 of 29% suggestive of severe airway obstruction. He smokes a pack per day since his teenage years and recently has cut down to half pack. He reports dyspnea on walking a few yards and cannot climb stairs. He is disabled for the last 5 years due to a bad back. He he uses albuterol inhaler about twice daily and reports intermittent wheezing, denies frequent chest colds. He denies paroxysmal nocturnal dyspnea, orthopnea or pedal edema he does report some acid heartburn on occasion.  CXR showed Chronic changes of COPD &  Dilatation of the central pulmonary arteries which may suggest  pulmonary arterial hypertension Spirometry showed Fev1 of 0.97 (37%) , as low as 0.71 (27%) with FVC 1.85 (58%)  Past Medical History  Diagnosis Date  . Chronic pain     with leg and back pain (disc problem)  . History of upper GI x-ray series     to follow showed large duodenal ulcer H pylori serologies were negative  . Acid reflux   . Hypertension   . Asthma   . COPD (chronic obstructive pulmonary disease)     Past Surgical History  Procedure Laterality Date  . Esophagogastroduodenoscopy  1/06    Distal esophageal erosions,U-shaped stomach,marked erosions and edema of the buld  . Back surgery  7/05; 5/09     Dr.Hirsch,3 lumbar  . Neck surgery  2/09  . Back surgery      No Known Allergies  History   Social History  . Marital  Status: Single    Spouse Name: N/A    Number of Children: 2  . Years of Education: N/A   Occupational History  . disabled    Social History Main Topics  . Smoking status: Current Every Day Smoker -- 0.50 packs/day for 40 years    Types: Cigarettes  . Smokeless tobacco: Not on file  . Alcohol Use: No  . Drug Use: No     Comment: last used 3 months ago 02/22/13  . Sexually Active: Not on file   Other Topics Concern  . Not on file   Social History Narrative   Lives with girlfriend, is on disability for history of back injuries.    Family History  Problem Relation Age of Onset  . Cancer Father   . Asthma Mother       Review of Systems  Constitutional: Negative for appetite change and unexpected weight change.  HENT: Negative for ear pain, congestion, sore throat, sneezing, trouble swallowing and dental problem.   Respiratory: Positive for cough and shortness of breath.   Cardiovascular: Negative for chest pain, palpitations and leg swelling.  Gastrointestinal: Negative for nausea and abdominal pain.  Musculoskeletal: Negative for joint swelling.  Skin: Negative for rash.  Neurological: Negative for headaches.  Psychiatric/Behavioral: Negative for dysphoric mood. The patient is not nervous/anxious.  Objective:   Physical Exam  Gen. Pleasant, well-nourished, in no distress, normal affect ENT - no lesions, no post nasal drip Neck: No JVD, no thyromegaly, no carotid bruits Lungs: no use of accessory muscles, no dullness to percussion, decreased without rales or rhonchi  Cardiovascular: Rhythm regular, heart sounds  normal, no murmurs or gallops, no peripheral edema Abdomen: soft and non-tender, no hepatosplenomegaly, BS normal. Musculoskeletal: No deformities, no cyanosis or clubbing Neuro:  alert, non focal       Assessment & Plan:

## 2013-02-28 DIAGNOSIS — IMO0002 Reserved for concepts with insufficient information to code with codable children: Secondary | ICD-10-CM | POA: Diagnosis not present

## 2013-02-28 DIAGNOSIS — M79609 Pain in unspecified limb: Secondary | ICD-10-CM | POA: Diagnosis not present

## 2013-03-13 ENCOUNTER — Encounter: Payer: Self-pay | Admitting: Pulmonary Disease

## 2013-03-13 DIAGNOSIS — Z79899 Other long term (current) drug therapy: Secondary | ICD-10-CM | POA: Diagnosis not present

## 2013-03-13 DIAGNOSIS — IMO0002 Reserved for concepts with insufficient information to code with codable children: Secondary | ICD-10-CM | POA: Diagnosis not present

## 2013-03-13 DIAGNOSIS — J449 Chronic obstructive pulmonary disease, unspecified: Secondary | ICD-10-CM | POA: Diagnosis not present

## 2013-03-13 DIAGNOSIS — F17200 Nicotine dependence, unspecified, uncomplicated: Secondary | ICD-10-CM | POA: Diagnosis not present

## 2013-03-14 DIAGNOSIS — Z79899 Other long term (current) drug therapy: Secondary | ICD-10-CM | POA: Diagnosis not present

## 2013-03-14 DIAGNOSIS — G894 Chronic pain syndrome: Secondary | ICD-10-CM | POA: Diagnosis not present

## 2013-03-14 DIAGNOSIS — IMO0001 Reserved for inherently not codable concepts without codable children: Secondary | ICD-10-CM | POA: Diagnosis not present

## 2013-03-14 DIAGNOSIS — M961 Postlaminectomy syndrome, not elsewhere classified: Secondary | ICD-10-CM | POA: Diagnosis not present

## 2013-03-14 DIAGNOSIS — M5030 Other cervical disc degeneration, unspecified cervical region: Secondary | ICD-10-CM | POA: Diagnosis not present

## 2013-03-28 ENCOUNTER — Ambulatory Visit (HOSPITAL_COMMUNITY)
Admission: RE | Admit: 2013-03-28 | Discharge: 2013-03-28 | Disposition: A | Discharge status: Home / Self Care | Payer: Medicare Other | Source: Ambulatory Visit | Attending: Internal Medicine | Admitting: Internal Medicine

## 2013-03-28 ENCOUNTER — Other Ambulatory Visit (HOSPITAL_COMMUNITY): Payer: Self-pay | Admitting: Internal Medicine

## 2013-03-28 DIAGNOSIS — F17200 Nicotine dependence, unspecified, uncomplicated: Secondary | ICD-10-CM | POA: Insufficient documentation

## 2013-03-28 DIAGNOSIS — J40 Bronchitis, not specified as acute or chronic: Secondary | ICD-10-CM | POA: Diagnosis not present

## 2013-03-28 DIAGNOSIS — J410 Simple chronic bronchitis: Secondary | ICD-10-CM | POA: Diagnosis not present

## 2013-03-28 DIAGNOSIS — M1990 Unspecified osteoarthritis, unspecified site: Secondary | ICD-10-CM | POA: Diagnosis not present

## 2013-03-28 DIAGNOSIS — K219 Gastro-esophageal reflux disease without esophagitis: Secondary | ICD-10-CM | POA: Diagnosis not present

## 2013-04-11 DIAGNOSIS — Z79899 Other long term (current) drug therapy: Secondary | ICD-10-CM | POA: Diagnosis not present

## 2013-04-11 DIAGNOSIS — M961 Postlaminectomy syndrome, not elsewhere classified: Secondary | ICD-10-CM | POA: Diagnosis not present

## 2013-04-11 DIAGNOSIS — G894 Chronic pain syndrome: Secondary | ICD-10-CM | POA: Diagnosis not present

## 2013-04-11 DIAGNOSIS — IMO0001 Reserved for inherently not codable concepts without codable children: Secondary | ICD-10-CM | POA: Diagnosis not present

## 2013-04-11 DIAGNOSIS — M5030 Other cervical disc degeneration, unspecified cervical region: Secondary | ICD-10-CM | POA: Diagnosis not present

## 2013-05-16 DIAGNOSIS — Z79899 Other long term (current) drug therapy: Secondary | ICD-10-CM | POA: Diagnosis not present

## 2013-05-16 DIAGNOSIS — M5030 Other cervical disc degeneration, unspecified cervical region: Secondary | ICD-10-CM | POA: Diagnosis not present

## 2013-05-16 DIAGNOSIS — M5137 Other intervertebral disc degeneration, lumbosacral region: Secondary | ICD-10-CM | POA: Diagnosis not present

## 2013-05-16 DIAGNOSIS — M961 Postlaminectomy syndrome, not elsewhere classified: Secondary | ICD-10-CM | POA: Diagnosis not present

## 2013-06-13 DIAGNOSIS — G894 Chronic pain syndrome: Secondary | ICD-10-CM | POA: Diagnosis not present

## 2013-06-13 DIAGNOSIS — M5030 Other cervical disc degeneration, unspecified cervical region: Secondary | ICD-10-CM | POA: Diagnosis not present

## 2013-06-13 DIAGNOSIS — M961 Postlaminectomy syndrome, not elsewhere classified: Secondary | ICD-10-CM | POA: Diagnosis not present

## 2013-06-13 DIAGNOSIS — Z79899 Other long term (current) drug therapy: Secondary | ICD-10-CM | POA: Diagnosis not present

## 2013-07-09 DIAGNOSIS — J410 Simple chronic bronchitis: Secondary | ICD-10-CM | POA: Diagnosis not present

## 2013-07-09 DIAGNOSIS — K5289 Other specified noninfective gastroenteritis and colitis: Secondary | ICD-10-CM | POA: Diagnosis not present

## 2013-08-28 ENCOUNTER — Encounter (HOSPITAL_COMMUNITY): Payer: Self-pay | Admitting: Emergency Medicine

## 2013-08-28 ENCOUNTER — Emergency Department (HOSPITAL_COMMUNITY)
Admission: EM | Admit: 2013-08-28 | Discharge: 2013-08-28 | Disposition: A | Discharge status: Home / Self Care | Payer: Medicare Other | Attending: Emergency Medicine | Admitting: Emergency Medicine

## 2013-08-28 DIAGNOSIS — M545 Low back pain, unspecified: Secondary | ICD-10-CM | POA: Diagnosis not present

## 2013-08-28 DIAGNOSIS — Z9889 Other specified postprocedural states: Secondary | ICD-10-CM | POA: Diagnosis not present

## 2013-08-28 DIAGNOSIS — J4489 Other specified chronic obstructive pulmonary disease: Secondary | ICD-10-CM | POA: Insufficient documentation

## 2013-08-28 DIAGNOSIS — M542 Cervicalgia: Secondary | ICD-10-CM | POA: Insufficient documentation

## 2013-08-28 DIAGNOSIS — I10 Essential (primary) hypertension: Secondary | ICD-10-CM | POA: Insufficient documentation

## 2013-08-28 DIAGNOSIS — J449 Chronic obstructive pulmonary disease, unspecified: Secondary | ICD-10-CM | POA: Diagnosis not present

## 2013-08-28 DIAGNOSIS — G8929 Other chronic pain: Secondary | ICD-10-CM | POA: Diagnosis not present

## 2013-08-28 DIAGNOSIS — F17200 Nicotine dependence, unspecified, uncomplicated: Secondary | ICD-10-CM | POA: Insufficient documentation

## 2013-08-28 DIAGNOSIS — Z79899 Other long term (current) drug therapy: Secondary | ICD-10-CM | POA: Insufficient documentation

## 2013-08-28 DIAGNOSIS — K219 Gastro-esophageal reflux disease without esophagitis: Secondary | ICD-10-CM | POA: Insufficient documentation

## 2013-08-28 MED ORDER — MELOXICAM 7.5 MG PO TABS: 7.5000 mg | ORAL_TABLET | Freq: Every day | ORAL | Status: DC

## 2013-08-28 MED ORDER — METHOCARBAMOL 500 MG PO TABS: 500.0000 mg | ORAL_TABLET | Freq: Two times a day (BID) | ORAL | Status: DC

## 2013-08-28 NOTE — ED Provider Notes (Signed)
CSN: [id number]     Arrival date & time 08/28/13  1153 History   First MD Initiated Contact with Patient 08/28/13 1206     Chief Complaint  Patient presents with  . Back Pain   (Consider location/radiation/quality/duration/timing/severity/associated sxs/prior Treatment) HPI Bryan [name] is a 57 y.o. male who presents to emergency department complaining of chronic back pain. Patient states that he has had back pain for 4 years. He states he has had 3 back surgeries performed by Dr. Luiz Ochoa. States since the surgery his back has just been worsening. He states his pain radiates down bilateral legs. He denies any numbness or weakness in his legs. He did denies any injuries. He denies any loss of bowels are urinary retention or incontinence. He denies any fevers. He states that he has been going to pain management clinic but states he was dismissed because he did not want to have been stimulator implanted which was they recommended. Patient states that he does not want to have another surgery because to him it seems like every one of them made his pain worse. He states that told him that he is dismissed until he decides to have this procedure done. Patient states he currently does not take any medications. Patient states he's here to get some pain relief.  Past Medical History  Diagnosis Date  . Chronic pain     with leg and back pain (disc problem)  . History of upper GI x-ray series     to follow showed large duodenal ulcer H pylori serologies were negative  . Acid reflux   . Hypertension   . Asthma   . COPD (chronic obstructive pulmonary disease)    Past Surgical History  Procedure Laterality Date  . Esophagogastroduodenoscopy  1/06    Distal esophageal erosions,U-shaped stomach,marked erosions and edema of the buld  . Back surgery  7/05; 5/09     Dr.Hirsch,3 lumbar  . Neck surgery  2/09  . Back surgery     Family History  Problem Relation Age of Onset  . Cancer Father   . Asthma  Mother    History  Substance Use Topics  . Smoking status: Current Every Day Smoker -- 0.50 packs/day for 40 years    Types: Cigarettes  . Smokeless tobacco: Not on file  . Alcohol Use: No    Review of Systems  Constitutional: Negative for fever and chills.  Respiratory: Negative for cough, chest tightness and shortness of breath.   Cardiovascular: Negative for chest pain, palpitations and leg swelling.  Gastrointestinal: Negative for abdominal pain and abdominal distention.  Genitourinary: Negative for dysuria, frequency and flank pain.  Musculoskeletal: Positive for back pain and neck pain. Negative for gait problem and neck stiffness.  Skin: Negative for rash.  Allergic/Immunologic: Negative for immunocompromised state.  Neurological: Negative for dizziness, weakness, light-headedness, numbness and headaches.    Allergies  Review of patient's allergies indicates no known allergies.  Home Medications   Current Outpatient Rx  Name  Route  Sig  Dispense  Refill  . EXPIRED: albuterol (PROVENTIL HFA;VENTOLIN HFA) 108 (90 BASE) MCG/ACT inhaler   Inhalation   Inhale 2 puffs into the lungs every 6 (six) hours as needed. Shortness of breath         . esomeprazole (NEXIUM) 40 MG capsule   Oral   Take 40 mg by mouth daily before breakfast.           . HYDROcodone-acetaminophen (NORCO) 7.5-325 MG per tablet   Oral  Take 1 tablet by mouth every 6 (six) hours as needed for pain.         . nicotine (NICOTROL) 10 MG inhaler   Inhalation   Inhale 1 puff into the lungs as needed for smoking cessation.   42 each   0    BP 155/102  Pulse 100  Temp(Src) 98.2 F (36.8 C) (Oral)  Resp 20  Ht _0  (1.6 m)  Wt 126 lb (57.153 kg)  BMI 22.33 kg/m2  SpO2 100% Physical Exam  Nursing note and vitals reviewed. Constitutional: He appears well-developed and well-nourished. No distress.  HENT:  Head: Normocephalic and atraumatic.  Eyes: Conjunctivae are normal.  Neck: Neck  supple.  Cardiovascular: Normal rate, regular rhythm and normal heart sounds.   Pulmonary/Chest: Effort normal. No respiratory distress. He has no wheezes. He has no rales.  Musculoskeletal: He exhibits no edema.  Midline lumbar spine tenderness. Bilateral perivertebral tenderness. Pain with bilateral straight leg raise  Neurological: He is alert.  5/5 and equal lower extremity strength. 2+ and equal patellar reflexes bilaterally. Pt able to dorsiflex bilateral toes and feet with good strength against resistance. Equal sensation bilaterally over thighs and lower legs.   Skin: Skin is warm and dry.    ED Course  Procedures (including critical care time) Labs Review Labs Reviewed - No data to display Imaging Review No results found.  EKG Interpretation   None       MDM   1. Chronic back pain     Patient is here with chronic back pain. Pt is neurovascularly intact. Normal reflexes. Normal sensation. No loss of bowels or urinary incontinence/retention. No recent falls or injuries. Pt ambulatory. Afebrile. No signs of cauda equina.  I discussed with patient that he needs to go back to pain management or primary care Dr. for further treatment. At this time I do not think any imaging is indicated. I will prescribe him Mobic and Robaxin for his symptoms. He is to followup outpatient for further treatment and management of his pain.  Filed Vitals:   08/28/13 1156  BP: 155/102  Pulse: 100  Temp: 98.2 F (36.8 C)  TempSrc: Oral  Resp: 20  Height: _1  (1.6 m)  Weight: 126 lb (57.153 kg)  SpO2: 100%        Renold Genta, PA-C 08/28/13 1403

## 2013-08-28 NOTE — ED Notes (Signed)
Pt c/o flare up of chronic lower back pain x 1 week.  Denies injury.

## 2013-08-28 NOTE — ED Provider Notes (Signed)
Medical screening examination/treatment/procedure(s) were performed by non-physician practitioner and as supervising physician I was immediately available for consultation/collaboration.  EKG Interpretation   None         Sharyon Cable, MD 08/28/13 1422

## 2013-10-15 DIAGNOSIS — K219 Gastro-esophageal reflux disease without esophagitis: Secondary | ICD-10-CM | POA: Diagnosis not present

## 2013-10-15 DIAGNOSIS — J410 Simple chronic bronchitis: Secondary | ICD-10-CM | POA: Diagnosis not present

## 2013-10-16 ENCOUNTER — Telehealth: Payer: Self-pay | Admitting: *Deleted

## 2013-10-16 NOTE — Telephone Encounter (Signed)
Bryan [name] A

## 2013-10-16 NOTE — Telephone Encounter (Signed)
Called and spoke with pt. Made him aware will need OV with RA first. He scheduled appt for 10/18/13. Nothing further needed

## 2013-10-18 ENCOUNTER — Ambulatory Visit (INDEPENDENT_AMBULATORY_CARE_PROVIDER_SITE_OTHER): Payer: Medicare Other | Admitting: Pulmonary Disease

## 2013-10-18 ENCOUNTER — Encounter: Payer: Self-pay | Admitting: Pulmonary Disease

## 2013-10-18 VITALS — BP 130/76 | HR 76 | Temp 98.2°F | Wt 117.7 lb

## 2013-10-18 DIAGNOSIS — J449 Chronic obstructive pulmonary disease, unspecified: Secondary | ICD-10-CM | POA: Diagnosis not present

## 2013-10-18 MED ORDER — VARENICLINE TARTRATE 0.5 MG X 11 & 1 MG X 42 PO MISC: ORAL | Status: DC

## 2013-10-18 NOTE — Progress Notes (Signed)
   Subjective:    Patient ID: Bryan [name], male    DOB: [birth year], 58 y.o.   MRN: [id number]  HPI  PCP - F[place]   58 year old heavy smoker referred for FU of COPD.  He had a DWI in 6237 and lost his license, he had not driven in over 20 years and renewed his drivers license in 6283. He was not able to blow into the ignition interlock and was referred to 2 pulmonologists to fill out the waiver form. Marland Kitchen He was seen by Dr. Luan Pulling in [place] who certified that he has severe COPD. Spirometry showed an FEV1 of 0.58 - 21% with FEV1 of 29% suggestive of severe airway obstruction.  He smokes a pack per day since his teenage years and recently has cut down to half pack. He reports dyspnea on walking a few yards and cannot climb stairs. He is disabled since 2009 due to a bad back. He uses albuterol inhaler about twice daily and reports intermittent wheezing, denies frequent chest colds. He denies paroxysmal nocturnal dyspnea, orthopnea or pedal edema he does report some acid heartburn on occasion.  CXR showed Chronic changes of COPD & dilatation of the central pulmonary arteries which may suggest  pulmonary arterial hypertension  Spirometry showed Fev1 of 0.97 (37%) , as low as 0.71 (27%) with FVC 1.85 (58%)   10/18/2013 Needs renewal of drivers' license  -opinion from 2 pulmonologists Smokes 1/2 PPD fev1 0.72 - 28% Was unable to quit with nicotine patch & inhaler  Past Medical History  Diagnosis Date  . Chronic pain     with leg and back pain (disc problem)  . History of upper GI x-ray series     to follow showed large duodenal ulcer H pylori serologies were negative  . Acid reflux   . Hypertension   . Asthma   . COPD (chronic obstructive pulmonary disease)      Review of Systems neg for any significant sore throat, dysphagia, itching, sneezing, nasal congestion or excess/ purulent secretions, fever, chills, sweats, unintended wt loss, pleuritic or exertional cp, hempoptysis, orthopnea pnd  or change in chronic leg swelling. Also denies presyncope, palpitations, heartburn, abdominal pain, nausea, vomiting, diarrhea or change in bowel or urinary habits, dysuria,hematuria, rash, arthralgias, visual complaints, headache, numbness weakness or ataxia.     Objective:   Physical Exam  Gen. Pleasant, well-nourished, in no distress, normal affect ENT - no lesions, no post nasal drip Neck: No JVD, no thyromegaly, no carotid bruits Lungs: no use of accessory muscles, no dullness to percussion, decreased BL  without rales or rhonchi  Cardiovascular: Rhythm regular, heart sounds  normal, no murmurs or gallops, no peripheral edema Abdomen: soft and non-tender, no hepatosplenomegaly, BS normal. Musculoskeletal: No deformities, no cyanosis or clubbing Neuro:  alert, non focal       Assessment & Plan:

## 2013-10-18 NOTE — Assessment & Plan Note (Signed)
Counselled Has failed nicotine supplments Trial of chantix Drivers license papers filled out

## 2013-10-18 NOTE — Patient Instructions (Signed)
You have severe COPD You have to QUIT smoking Rx for chantix - we discussed side effects Spiriva sample -respimat - call me for rx if this helps your breathing Use albuterol as needed only

## 2013-10-18 NOTE — Assessment & Plan Note (Signed)
Spiriva sample -respimat - call me for rx if this helps your breathing Use albuterol as needed only

## 2013-10-20 ENCOUNTER — Encounter (HOSPITAL_COMMUNITY): Payer: Self-pay | Admitting: Emergency Medicine

## 2013-10-20 ENCOUNTER — Emergency Department (HOSPITAL_COMMUNITY)
Admission: EM | Admit: 2013-10-20 | Discharge: 2013-10-21 | Disposition: A | Discharge status: Home / Self Care | Payer: Medicare Other | Attending: Emergency Medicine | Admitting: Emergency Medicine

## 2013-10-20 ENCOUNTER — Emergency Department (HOSPITAL_COMMUNITY): Payer: Medicare Other

## 2013-10-20 DIAGNOSIS — J4489 Other specified chronic obstructive pulmonary disease: Secondary | ICD-10-CM | POA: Insufficient documentation

## 2013-10-20 DIAGNOSIS — J449 Chronic obstructive pulmonary disease, unspecified: Secondary | ICD-10-CM | POA: Insufficient documentation

## 2013-10-20 DIAGNOSIS — R300 Dysuria: Secondary | ICD-10-CM | POA: Diagnosis not present

## 2013-10-20 DIAGNOSIS — K219 Gastro-esophageal reflux disease without esophagitis: Secondary | ICD-10-CM | POA: Insufficient documentation

## 2013-10-20 DIAGNOSIS — R1011 Right upper quadrant pain: Secondary | ICD-10-CM | POA: Diagnosis not present

## 2013-10-20 DIAGNOSIS — I10 Essential (primary) hypertension: Secondary | ICD-10-CM | POA: Insufficient documentation

## 2013-10-20 DIAGNOSIS — F17200 Nicotine dependence, unspecified, uncomplicated: Secondary | ICD-10-CM | POA: Diagnosis not present

## 2013-10-20 DIAGNOSIS — Z79899 Other long term (current) drug therapy: Secondary | ICD-10-CM | POA: Insufficient documentation

## 2013-10-20 DIAGNOSIS — R109 Unspecified abdominal pain: Secondary | ICD-10-CM

## 2013-10-20 DIAGNOSIS — M549 Dorsalgia, unspecified: Secondary | ICD-10-CM | POA: Diagnosis not present

## 2013-10-20 DIAGNOSIS — Z791 Long term (current) use of non-steroidal anti-inflammatories (NSAID): Secondary | ICD-10-CM | POA: Diagnosis not present

## 2013-10-20 DIAGNOSIS — G8929 Other chronic pain: Secondary | ICD-10-CM | POA: Diagnosis not present

## 2013-10-20 DIAGNOSIS — R1033 Periumbilical pain: Secondary | ICD-10-CM | POA: Diagnosis not present

## 2013-10-20 LAB — URINALYSIS, ROUTINE W REFLEX MICROSCOPIC
Bilirubin Urine: NEGATIVE
Glucose, UA: NEGATIVE mg/dL
HGB URINE DIPSTICK: NEGATIVE
Ketones, ur: NEGATIVE mg/dL
Leukocytes, UA: NEGATIVE
NITRITE: NEGATIVE
PH: 5.5 (ref 5.0–8.0)
Protein, ur: NEGATIVE mg/dL
Specific Gravity, Urine: 1.025 (ref 1.005–1.030)
UROBILINOGEN UA: 0.2 mg/dL (ref 0.0–1.0)

## 2013-10-20 LAB — CBC WITH DIFFERENTIAL/PLATELET
Basophils Absolute: 0.1 10*3/uL (ref 0.0–0.1)
Basophils Relative: 1 % (ref 0–1)
Eosinophils Absolute: 0.3 10*3/uL (ref 0.0–0.7)
Eosinophils Relative: 4 % (ref 0–5)
HCT: 45 % (ref 39.0–52.0)
HEMOGLOBIN: 16 g/dL (ref 13.0–17.0)
LYMPHS ABS: 3.4 10*3/uL (ref 0.7–4.0)
LYMPHS PCT: 40 % (ref 12–46)
MCH: 30.9 pg (ref 26.0–34.0)
MCHC: 35.6 g/dL (ref 30.0–36.0)
MCV: 87 fL (ref 78.0–100.0)
Monocytes Absolute: 1 10*3/uL (ref 0.1–1.0)
Monocytes Relative: 11 % (ref 3–12)
NEUTROS PCT: 44 % (ref 43–77)
Neutro Abs: 3.7 10*3/uL (ref 1.7–7.7)
Platelets: 266 10*3/uL (ref 150–400)
RBC: 5.17 MIL/uL (ref 4.22–5.81)
RDW: 14.1 % (ref 11.5–15.5)
WBC: 8.4 10*3/uL (ref 4.0–10.5)

## 2013-10-20 LAB — BASIC METABOLIC PANEL
BUN: 7 mg/dL (ref 6–23)
CHLORIDE: 101 meq/L (ref 96–112)
CO2: 25 meq/L (ref 19–32)
Calcium: 9 mg/dL (ref 8.4–10.5)
Creatinine, Ser: 0.95 mg/dL (ref 0.50–1.35)
GFR calc Af Amer: >90 mL/min (ref >90)
GFR calc non Af Amer: >90 mL/min (ref >90)
GLUCOSE: 96 mg/dL (ref 70–99)
POTASSIUM: 3.7 meq/L (ref 3.7–5.3)
Sodium: 138 mEq/L (ref 137–147)

## 2013-10-20 LAB — LIPASE, BLOOD: Lipase: 68 U/L — ABNORMAL HIGH (ref 11–59)

## 2013-10-20 MED ORDER — IOHEXOL 300 MG/ML  SOLN
50.0000 mL | Freq: Once | INTRAMUSCULAR | Status: AC | PRN
  Administered 2013-10-20: 50 mL via ORAL

## 2013-10-20 MED ORDER — IOHEXOL 300 MG/ML  SOLN
100.0000 mL | Freq: Once | INTRAMUSCULAR | Status: AC | PRN
  Administered 2013-10-20: 100 mL via INTRAVENOUS

## 2013-10-20 MED ORDER — MORPHINE SULFATE 4 MG/ML IJ SOLN
4.0000 mg | Freq: Once | INTRAMUSCULAR | Status: AC
  Administered 2013-10-20: 4 mg via INTRAVENOUS
  Filled 2013-10-20: qty 1

## 2013-10-20 MED ORDER — ONDANSETRON HCL 4 MG/2ML IJ SOLN
4.0000 mg | Freq: Once | INTRAMUSCULAR | Status: AC
  Administered 2013-10-20: 4 mg via INTRAVENOUS
  Filled 2013-10-20: qty 2

## 2013-10-20 NOTE — ED Provider Notes (Signed)
CSN: [id number]     Arrival date & time 10/20/13  1824 History  This chart was scribed for Bryan Essex, MD by Zettie Pho, ED Scribe. This patient was seen in room APA08/APA08 and the patient's care was started at 9:13 PM.    Chief Complaint  Patient presents with  . Abdominal Pain   The history is provided by the patient. No language interpreter was used.   HPI Comments: Bryan [name] is a 58 y.o. Male with a history of acid reflux who presents to the Emergency Department complaining of an intermittent, sharp pain to the periumbilical region of the abdomen onset last night that he states lasts for a few seconds. He reports some associated back pain and intermittent dysuria. Patient states that these symptoms are new for him. He denies hematuria, fever, testicular pain. He denies history of kidney stones. He denies any allergies. Patient has a history of chronic back pain, HTN, asthma, and COPD.   Past Medical History  Diagnosis Date  . Chronic pain     with leg and back pain (disc problem)  . History of upper GI x-ray series     to follow showed large duodenal ulcer H pylori serologies were negative  . Acid reflux   . Hypertension   . Asthma   . COPD (chronic obstructive pulmonary disease)    Past Surgical History  Procedure Laterality Date  . Esophagogastroduodenoscopy  1/06    Distal esophageal erosions,U-shaped stomach,marked erosions and edema of the buld  . Back surgery  7/05; 5/09     Dr.Hirsch,3 lumbar  . Neck surgery  2/09  . Back surgery     Family History  Problem Relation Age of Onset  . Cancer Father   . Asthma Mother    History  Substance Use Topics  . Smoking status: Current Every Day Smoker -- 0.50 packs/day for 40 years    Types: Cigarettes  . Smokeless tobacco: Not on file  . Alcohol Use: No    Review of Systems  A complete 10 system review of systems was obtained and all systems are negative except as noted in the HPI and PMH.   Allergies  Review  of patient's allergies indicates no known allergies.  Home Medications   Current Outpatient Rx  Name  Route  Sig  Dispense  Refill  . esomeprazole (NEXIUM) 40 MG capsule   Oral   Take 40 mg by mouth daily before breakfast.           . ibuprofen (ADVIL,MOTRIN) 800 MG tablet   Oral   Take 800 mg by mouth 3 (three) times daily as needed. pain         . meloxicam (MOBIC) 7.5 MG tablet   Oral   Take 1 tablet (7.5 mg total) by mouth daily.   20 tablet   0   . methocarbamol (ROBAXIN) 500 MG tablet   Oral   Take 1 tablet (500 mg total) by mouth 2 (two) times daily.   20 tablet   0   . varenicline (CHANTIX PAK) 0.5 MG X 11 & 1 MG X 42 tablet      Take0.5 mg tablet by mouth once daily x 3 days, then0.5 mg tablet twice daily x 4 days, then increase 1 mg tablet twice daily.   53 tablet   0   . HYDROcodone-acetaminophen (NORCO/VICODIN) 5-325 MG per tablet   Oral   Take 2 tablets by mouth every 4 (four) hours as needed.  10 tablet   0   . ondansetron (ZOFRAN) 4 MG tablet   Oral   Take 1 tablet (4 mg total) by mouth every 6 (six) hours.   12 tablet   0    Triage Vitals: BP 156/89  Pulse 71  Temp(Src) 98.4 F (36.9 C) (Oral)  Resp 20  Ht _0  (1.6 m)  Wt 118 lb (53.524 kg)  BMI 20.91 kg/m2  SpO2 95%  Physical Exam  Nursing note and vitals reviewed. Constitutional: He is oriented to person, place, and time. He appears well-developed and well-nourished. No distress.  HENT:  Head: Normocephalic and atraumatic.  Mouth/Throat: Oropharynx is clear and moist. No oropharyngeal exudate.  Eyes: Conjunctivae are normal.  Neck: Normal range of motion. Neck supple.  Cardiovascular: Normal rate, regular rhythm and normal heart sounds.   No murmur heard. Pulmonary/Chest: Effort normal and breath sounds normal. No respiratory distress.  Abdominal: Soft. Bowel sounds are normal. He exhibits no distension. There is tenderness in the right upper quadrant, periumbilical area and  suprapubic area. There is guarding.  Tenderness to palpation to the periumbilical, suprapubic region, and RUQ with guarding.   Genitourinary: Penis normal.  Normal uncircumcised penis. No scrotal swelling or testicular tenderness.   Exam was performed with pt's permission. Chaperone (scribe) was present. The exam was performed with no discomfort or complications.    Musculoskeletal: Normal range of motion. He exhibits no edema and no tenderness.  Neurological: He is alert and oriented to person, place, and time.  Skin: Skin is warm and dry.  Psychiatric: He has a normal mood and affect. His behavior is normal.    ED Course  Procedures (including critical care time)  DIAGNOSTIC STUDIES: Oxygen Saturation is 95% on room air, adequate by my interpretation.    COORDINATION OF CARE: 9:18 PM- Ordered UA, blood labs, and a CT of the abdomen. Will order Zofran and morphine to manage symptoms. Discussed treatment plan with patient at bedside and patient verbalized agreement.     Labs Review Labs Reviewed  LIPASE, BLOOD - Abnormal; Notable for the following:    Lipase 68 (*)    All other components within normal limits  URINALYSIS, ROUTINE W REFLEX MICROSCOPIC  CBC WITH DIFFERENTIAL  BASIC METABOLIC PANEL   Imaging Review Ct Abdomen Pelvis W Contrast  10/20/2013   CLINICAL DATA:  Abdomen pain  EXAM: CT ABDOMEN AND PELVIS WITH CONTRAST  TECHNIQUE: Multidetector CT imaging of the abdomen and pelvis was performed using the standard protocol following bolus administration of intravenous contrast.  CONTRAST:  47m OMNIPAQUE IOHEXOL 300 MG/ML SOLN, 1077mOMNIPAQUE IOHEXOL 300 MG/ML SOLN  COMPARISON:  March 25, 2009  FINDINGS: The liver, spleen, pancreas, gallbladder, adrenal glands and kidneys are normal. There is no hydronephrosis bilaterally. There is atherosclerosis of the abdominal aorta without aneurysmal dilatation. There is no abdominal lymphadenopathy. There is no small bowel obstruction.  The appendix is normal. There is diffuse bowel wall thickening of the left-sided sigmoid colon without surrounding inflammatory change. Fluid-filled bladder is normal. The lung bases are clear. Degenerative joint changes of the spine are identified. Patient is status post posterior fusion of L3/L4 without malalignment.  IMPRESSION: No acute abnormality identified in the abdomen and pelvis. Diffuse bowel wall thickening of sigmoid colon. Further evaluation with direct visualization is recommended to exclude neoplasm.   Electronically Signed   By: WeAbelardo Diesel.D.   On: 10/20/2013 23:47    EKG Interpretation   None  MDM   1. Abdominal pain     periumbilical abdominal pain that has been constant since yesterday. No nausea, vomiting or diarrhea. No fever or urinary symptoms.  Labs remarkable for mild elevation of lipase at 68. Normal LFTs.  CT scan shows no acute abnormality. Thickening of the sigmoid colon. Patient has seen Dr. Gala Romney in the past and had previous colonoscopy. Will be referred back to him.  Given right upper quadrant tenderness, we'll schedule for ultrasound tomorrow. Return precautions discussed.  Tolerating PO in the ED.  I personally performed the services described in this documentation, which was scribed in my presence. The recorded information has been reviewed and is accurate.     Bryan Essex, MD 10/21/13 [phone]

## 2013-10-20 NOTE — ED Notes (Signed)
Pt c/o abd pain located at umbilicus area that started yesterday, denies any n/v/d,

## 2013-10-21 ENCOUNTER — Ambulatory Visit (HOSPITAL_COMMUNITY): Admit: 2013-10-21 | Payer: Medicare Other

## 2013-10-21 ENCOUNTER — Other Ambulatory Visit (HOSPITAL_COMMUNITY): Payer: Self-pay | Admitting: Emergency Medicine

## 2013-10-21 DIAGNOSIS — R1011 Right upper quadrant pain: Secondary | ICD-10-CM

## 2013-10-21 MED ORDER — HYDROCODONE-ACETAMINOPHEN 5-325 MG PO TABS: 2.0000 | ORAL_TABLET | ORAL | Status: DC | PRN

## 2013-10-21 MED ORDER — ONDANSETRON HCL 4 MG PO TABS: 4.0000 mg | ORAL_TABLET | Freq: Four times a day (QID) | ORAL | Status: DC

## 2013-10-21 NOTE — Discharge Instructions (Signed)
Abdominal Pain, Adult Follow up tomorrow for an ultrasound of your gallbladder. Follow up with Dr. Gala Romney regarding the thickening of your colon. Return to the ED if you develop new or worsening symptoms. Many things can cause abdominal pain. Usually, abdominal pain is not caused by a disease and will improve without treatment. It can often be observed and treated at home. Your health care provider will do a physical exam and possibly order blood tests and X-rays to help determine the seriousness of your pain. However, in many cases, more time must pass before a clear cause of the pain can be found. Before that point, your health care provider may not know if you need more testing or further treatment. HOME CARE INSTRUCTIONS  Monitor your abdominal pain for any changes. The following actions may help to alleviate any discomfort you are experiencing:  Only take over-the-counter or prescription medicines as directed by your health care provider.  Do not take laxatives unless directed to do so by your health care provider.  Try a clear liquid diet (broth, tea, or water) as directed by your health care provider. Slowly move to a bland diet as tolerated. SEEK MEDICAL CARE IF:  You have unexplained abdominal pain.  You have abdominal pain associated with nausea or diarrhea.  You have pain when you urinate or have a bowel movement.  You experience abdominal pain that wakes you in the night.  You have abdominal pain that is worsened or improved by eating food.  You have abdominal pain that is worsened with eating fatty foods. SEEK IMMEDIATE MEDICAL CARE IF:   Your pain does not go away within 2 hours.  You have a fever.  You keep throwing up (vomiting).  Your pain is felt only in portions of the abdomen, such as the right side or the left lower portion of the abdomen.  You pass bloody or black tarry stools. MAKE SURE YOU:  Understand these instructions.   Will watch your condition.    Will get help right away if you are not doing well or get worse.  Document Released: 06/08/2005 Document Revised: 06/19/2013 Document Reviewed: 05/08/2013 [facility] Patient Information 2014 [place].

## 2013-10-22 ENCOUNTER — Encounter: Payer: Self-pay | Admitting: Gastroenterology

## 2013-10-22 ENCOUNTER — Ambulatory Visit (HOSPITAL_COMMUNITY)
Admission: RE | Admit: 2013-10-22 | Discharge: 2013-10-22 | Disposition: A | Discharge status: Home / Self Care | Payer: Medicare Other | Source: Ambulatory Visit | Attending: Emergency Medicine | Admitting: Emergency Medicine

## 2013-10-22 DIAGNOSIS — R109 Unspecified abdominal pain: Secondary | ICD-10-CM | POA: Insufficient documentation

## 2013-10-22 DIAGNOSIS — R1011 Right upper quadrant pain: Secondary | ICD-10-CM

## 2013-10-22 NOTE — ED Provider Notes (Signed)
Patient returned today for outpatient ultrasound arranged at his previous ER visit. Ultrasound report was reviewed and is entirely negative. Patient was informed of the findings in consultation follow up with his primary doctor in the office if his symptoms continue.  Orpah Greek, MD 10/22/13 [phone]

## 2013-10-23 ENCOUNTER — Telehealth: Payer: Self-pay | Admitting: Pulmonary Disease

## 2013-10-23 NOTE — Telephone Encounter (Signed)
Per Aniceto Boss at [employer] she will fax PA request to be filled out and faxed backed within 24 hrs.  She states can not be done on phone, form to be filled out for PA on [employer]. I will get this form from fax and fill out, fax back and await response.

## 2013-10-23 NOTE — ED Provider Notes (Signed)
Was contacted by a pharmacist, who requested that I changed the prescription from Zofran, to promethazine, so the patient could afford it. I authorized a prescription for Phenergan 25 mg, #15 every 6 hours when necessary nausea or vomiting  Richarda Blade, MD 10/23/13 1549

## 2013-10-30 DIAGNOSIS — J449 Chronic obstructive pulmonary disease, unspecified: Secondary | ICD-10-CM | POA: Diagnosis not present

## 2013-11-11 ENCOUNTER — Encounter: Payer: Self-pay | Admitting: Gastroenterology

## 2013-11-11 ENCOUNTER — Ambulatory Visit (INDEPENDENT_AMBULATORY_CARE_PROVIDER_SITE_OTHER): Payer: Medicare Other | Admitting: Gastroenterology

## 2013-11-11 VITALS — BP 145/85 | HR 74 | Temp 97.9°F | Wt 119.0 lb

## 2013-11-11 DIAGNOSIS — R6881 Early satiety: Secondary | ICD-10-CM

## 2013-11-11 DIAGNOSIS — R109 Unspecified abdominal pain: Secondary | ICD-10-CM | POA: Diagnosis not present

## 2013-11-11 MED ORDER — PEG 3350-KCL-NA BICARB-NACL 420 G PO SOLR: 4000.0000 mL | ORAL | Status: DC

## 2013-11-11 MED ORDER — PROMETHAZINE HCL 25 MG/ML IJ SOLN: 25.0000 mg | Freq: Once | INTRAMUSCULAR | Status: DC

## 2013-11-11 NOTE — Patient Instructions (Signed)
Please have blood work done and complete stool sample.   We have scheduled you for a colonoscopy and upper endoscopy with Dr. Gala Romney in the near future.  Continue to take Nexium 1 capsule each morning, 30 minutes before breakfast.   Avoid Ibuprofen, Mobic, Advil, Aleve.

## 2013-11-11 NOTE — Progress Notes (Signed)
Primary Care Physician:  Rosita Fire, MD Primary Gastroenterologist:  Dr. Gala Romney   Chief Complaint  Patient presents with  . Abdominal Pain    HPI:   Bryan [name] presents today with a history of adenomatous colon polyps, last colonoscopy in Feb 2012 by Dr. Gala Romney. Remote EGD in 2006.  Somewhat diffuse abdominal pain, level of umbilicus to lower abdomen, radiating up sides.  onset a few weeks ago. Presented to ED. Noted as constant. Early satiety. No N/V. States saw black stool about 2 days ago. Next day light brown. Denies constipation. Will have loose stool for a few days then resolve, then start back up. 2-3 weeks ago small amount hematochezia. Abdominal pain improved. Intermittent now. Ibuprofen daily. No aspirin powders. Appetite waxes and wanes. Nexium for GERD.   CT in ED showed diffuse bowel wall thickening of sigmoid colon.    Past Medical History  Diagnosis Date  . Chronic pain     with leg and back pain (disc problem)  . History of upper GI x-ray series     to follow showed large duodenal ulcer H pylori serologies were negative  . Acid reflux   . Hypertension   . Asthma   . COPD (chronic obstructive pulmonary disease)     Past Surgical History  Procedure Laterality Date  . Esophagogastroduodenoscopy  1/06    Dr. Volney [place] esophageal erosions,U-shaped stomach,marked erosions and edema of the bulb without discrete ulcer disease.   . Back surgery  7/05; 5/09     Dr.Hirsch,3 lumbar  . Neck surgery  2/09  . Back surgery    . Colonoscopy  Feb 2012    Dr. Gala Romney: normal rectum, pedunculate polyp removed but not recovered    Current Outpatient Prescriptions  Medication Sig Dispense Refill  . esomeprazole (NEXIUM) 40 MG capsule Take 40 mg by mouth daily before breakfast.        . ibuprofen (ADVIL,MOTRIN) 800 MG tablet Take 800 mg by mouth 3 (three) times daily as needed. pain      . meloxicam (MOBIC) 7.5 MG tablet Take 1 tablet (7.5 mg total) by mouth daily.  20  tablet  0  . PROAIR HFA 108 (90 BASE) MCG/ACT inhaler Inhale 2 puffs into the lungs every 6 (six) hours as needed.       . polyethylene glycol-electrolytes (TRILYTE) 420 G solution Take 4,000 mLs by mouth as directed.  4000 mL  0   Current Facility-Administered Medications  Medication Dose Route Frequency Provider Last Rate Last Dose  . [START ON 11/18/2013] promethazine (PHENERGAN) injection 25 mg  25 mg Intravenous Once Daneil Dolin, MD        Allergies as of 11/11/2013  . (No Known Allergies)    Family History  Problem Relation Age of Onset  . Cancer Father   . Asthma Mother   . Colon cancer Neg Hx     History   Social History  . Marital Status: Single    Spouse Name: N/A    Number of Children: 2  . Years of Education: N/A   Occupational History  . disabled    Social History Main Topics  . Smoking status: Current Every Day Smoker -- 0.50 packs/day for 40 years    Types: Cigarettes  . Smokeless tobacco: Not on file  . Alcohol Use: No  . Drug Use: No     Comment: Last used Feb 2015  . Sexual Activity: Not on file   Other Topics Concern  .  Not on file   Social History Narrative   Lives with girlfriend, is on disability for history of back injuries.    Review of Systems: Gen: see HPI  CV: Denies chest pain, heart palpitations, peripheral edema, syncope.  Resp: +DOE GI: see HPI GU : Denies urinary burning, urinary frequency, urinary hesitancy MS: +joint pain, arthritis Derm: Denies rash, itching, dry skin Psych: Denies depression, anxiety, memory loss, and confusion Heme: Denies bruising, bleeding, and enlarged lymph nodes.  Physical Exam: BP 145/85  Pulse 74  Temp(Src) 97.9 F (36.6 C) (Oral)  Wt 119 lb (53.978 kg) General:   Alert and oriented. Pleasant and cooperative. Well-nourished and well-developed.  Head:  Normocephalic and atraumatic. Eyes:  Without icterus, sclera clear and conjunctiva pink.  Ears:  Normal auditory acuity. Nose:  No  deformity, discharge,  or lesions. Mouth:  No deformity or lesions, oral mucosa pink. Poor dentition.  Neck:  Supple, without mass or thyromegaly. Lungs:  Clear to auscultation bilaterally. No wheezes, rales, or rhonchi. No distress.  Heart:  S1, S2 present without murmurs appreciated.  Abdomen:  +BS, soft, non-tender and non-distended. No HSM noted. No guarding or rebound. No masses appreciated.  Rectal:  Deferred  Msk:  Symmetrical without gross deformities. Normal posture. Extremities:  Without clubbing or edema. Neurologic:  Alert and  oriented x4;  grossly normal neurologically. Skin:  Intact without significant lesions or rashes. Cervical Nodes:  No significant cervical adenopathy. Psych:  Alert and cooperative. Normal mood and affect.   Feb 2015 CT abd/pelvis with contrast:  IMPRESSION:  No acute abnormality identified in the abdomen and pelvis. Diffuse  bowel wall thickening of sigmoid colon. Further evaluation with  direct visualization is recommended to exclude neoplasm.   Feb 2015 [place] of abdomen:  Normal

## 2013-11-12 DIAGNOSIS — R109 Unspecified abdominal pain: Secondary | ICD-10-CM | POA: Diagnosis not present

## 2013-11-12 LAB — CBC
HEMATOCRIT: 47.9 % (ref 39.0–52.0)
HEMOGLOBIN: 16.7 g/dL (ref 13.0–17.0)
MCH: 30.5 pg (ref 26.0–34.0)
MCHC: 34.9 g/dL (ref 30.0–36.0)
MCV: 87.6 fL (ref 78.0–100.0)
PLATELETS: 252 10*3/uL (ref 150–400)
RBC: 5.47 MIL/uL (ref 4.22–5.81)
RDW: 15 % (ref 11.5–15.5)
WBC: 6.4 10*3/uL (ref 4.0–10.5)

## 2013-11-13 ENCOUNTER — Encounter: Payer: Self-pay | Admitting: Gastroenterology

## 2013-11-13 DIAGNOSIS — G8929 Other chronic pain: Secondary | ICD-10-CM | POA: Insufficient documentation

## 2013-11-13 DIAGNOSIS — R109 Unspecified abdominal pain: Secondary | ICD-10-CM | POA: Insufficient documentation

## 2013-11-13 DIAGNOSIS — R6881 Early satiety: Secondary | ICD-10-CM | POA: Insufficient documentation

## 2013-11-13 LAB — CLOSTRIDIUM DIFFICILE BY PCR: Toxigenic C. Difficile by PCR: NOT DETECTED

## 2013-11-13 NOTE — Assessment & Plan Note (Signed)
Last EGD in 2006, possible reports of one episode of melena. Doubt this is true melena but in the presence of Ibuprofen daily, concern for NSAID gastritis or PUD. Check CBC now. Proceed with EGD at time of TCS.   Proceed with upper endoscopy in the near future with Dr. Gala Romney. The risks, benefits, and alternatives have been discussed in detail with patient. They have stated understanding and desire to proceed.  Phenergan 25 mg IV on call

## 2013-11-13 NOTE — Progress Notes (Signed)
Quick Note:  Cdiff PCR negative. CBC completely normal with Hgb 16.7. Doubt true melena.  Proceed with TCS/EGD as planned. ______

## 2013-11-13 NOTE — Assessment & Plan Note (Signed)
58 year old male with several week history of diffuse abdominal discomfort that has improved somewhat at time of visit, associated with early satiety and intermittent loose stool. No rectal bleeding. Recent CT with diffuse bowel wall thickening of sigmoid colon. Hx of adenomatous colon polyps with last TCS in 2012. Needs endoscopic evaluation to rule out neoplasm; with intermittent loose stool also recommend obtaining Cdiff PCR.   Proceed with TCS with Dr. Gala Romney in near future: the risks, benefits, and alternatives have been discussed with the patient in detail. The patient states understanding and desires to proceed. Phenergan 25 mg IV on call due to marijuana use Cdiff PCR

## 2013-11-14 ENCOUNTER — Encounter (HOSPITAL_COMMUNITY): Payer: Self-pay | Admitting: Pharmacy Technician

## 2013-11-14 NOTE — Progress Notes (Signed)
cc'd to pcp 

## 2013-11-18 ENCOUNTER — Encounter (HOSPITAL_COMMUNITY): Payer: Self-pay

## 2013-11-18 ENCOUNTER — Encounter (HOSPITAL_COMMUNITY)
Admission: RE | Disposition: A | Discharge status: Home / Self Care | Payer: Self-pay | Source: Ambulatory Visit | Attending: Internal Medicine

## 2013-11-18 ENCOUNTER — Ambulatory Visit (HOSPITAL_COMMUNITY)
Admission: RE | Admit: 2013-11-18 | Discharge: 2013-11-18 | Disposition: A | Discharge status: Home / Self Care | Payer: Medicare Other | Source: Ambulatory Visit | Attending: Internal Medicine | Admitting: Internal Medicine

## 2013-11-18 DIAGNOSIS — F17200 Nicotine dependence, unspecified, uncomplicated: Secondary | ICD-10-CM | POA: Diagnosis not present

## 2013-11-18 DIAGNOSIS — I10 Essential (primary) hypertension: Secondary | ICD-10-CM | POA: Diagnosis not present

## 2013-11-18 DIAGNOSIS — K2940 Chronic atrophic gastritis without bleeding: Secondary | ICD-10-CM | POA: Diagnosis not present

## 2013-11-18 DIAGNOSIS — K921 Melena: Secondary | ICD-10-CM

## 2013-11-18 DIAGNOSIS — R109 Unspecified abdominal pain: Secondary | ICD-10-CM | POA: Diagnosis not present

## 2013-11-18 DIAGNOSIS — J449 Chronic obstructive pulmonary disease, unspecified: Secondary | ICD-10-CM | POA: Diagnosis not present

## 2013-11-18 DIAGNOSIS — D126 Benign neoplasm of colon, unspecified: Secondary | ICD-10-CM | POA: Insufficient documentation

## 2013-11-18 DIAGNOSIS — Z79899 Other long term (current) drug therapy: Secondary | ICD-10-CM | POA: Insufficient documentation

## 2013-11-18 DIAGNOSIS — K2990 Gastroduodenitis, unspecified, without bleeding: Secondary | ICD-10-CM | POA: Diagnosis not present

## 2013-11-18 DIAGNOSIS — K2970 Gastritis, unspecified, without bleeding: Secondary | ICD-10-CM | POA: Diagnosis not present

## 2013-11-18 DIAGNOSIS — J4489 Other specified chronic obstructive pulmonary disease: Secondary | ICD-10-CM | POA: Insufficient documentation

## 2013-11-18 HISTORY — PX: COLONOSCOPY WITH ESOPHAGOGASTRODUODENOSCOPY (EGD): SHX5779

## 2013-11-18 SURGERY — COLONOSCOPY WITH ESOPHAGOGASTRODUODENOSCOPY (EGD)
Anesthesia: Moderate Sedation

## 2013-11-18 MED ORDER — PROMETHAZINE HCL 25 MG/ML IJ SOLN
INTRAMUSCULAR | Status: DC | PRN
  Administered 2013-11-18: 25 mg via INTRAVENOUS

## 2013-11-18 MED ORDER — STERILE WATER FOR IRRIGATION IR SOLN
Status: DC | PRN
  Administered 2013-11-18: 14:00:00

## 2013-11-18 MED ORDER — PROMETHAZINE HCL 25 MG/ML IJ SOLN
INTRAMUSCULAR | Status: AC
  Filled 2013-11-18: qty 1

## 2013-11-18 MED ORDER — SODIUM CHLORIDE 0.9 % IJ SOLN
INTRAMUSCULAR | Status: AC
  Filled 2013-11-18: qty 10

## 2013-11-18 MED ORDER — LIDOCAINE VISCOUS 2 % MT SOLN
OROMUCOSAL | Status: AC
  Filled 2013-11-18: qty 15

## 2013-11-18 MED ORDER — MEPERIDINE HCL 100 MG/ML IJ SOLN
INTRAMUSCULAR | Status: DC | PRN
  Administered 2013-11-18 (×2): 25 mg via INTRAVENOUS
  Administered 2013-11-18: 50 mg via INTRAVENOUS

## 2013-11-18 MED ORDER — MIDAZOLAM HCL 5 MG/5ML IJ SOLN
INTRAMUSCULAR | Status: AC
  Filled 2013-11-18: qty 10

## 2013-11-18 MED ORDER — MEPERIDINE HCL 100 MG/ML IJ SOLN
INTRAMUSCULAR | Status: AC
  Filled 2013-11-18: qty 2

## 2013-11-18 MED ORDER — LIDOCAINE VISCOUS 2 % MT SOLN
OROMUCOSAL | Status: DC | PRN
  Administered 2013-11-18: 3 mL via OROMUCOSAL

## 2013-11-18 MED ORDER — SODIUM CHLORIDE 0.9 % IV SOLN
INTRAVENOUS | Status: DC
  Administered 2013-11-18: 13:00:00 via INTRAVENOUS

## 2013-11-18 MED ORDER — ONDANSETRON HCL 4 MG/2ML IJ SOLN
INTRAMUSCULAR | Status: AC
  Filled 2013-11-18: qty 2

## 2013-11-18 MED ORDER — MIDAZOLAM HCL 5 MG/5ML IJ SOLN
INTRAMUSCULAR | Status: DC | PRN
  Administered 2013-11-18: 1 mg via INTRAVENOUS
  Administered 2013-11-18: 2 mg via INTRAVENOUS
  Administered 2013-11-18 (×2): 1 mg via INTRAVENOUS

## 2013-11-18 NOTE — Op Note (Signed)
[place], [id number]   COLONOSCOPY PROCEDURE REPORT  PATIENT: Bryan [name], Bryan [name]  MR#:         [id number] BIRTHDATE: 04-15-1956 , 50  yrs. old GENDER: Male ENDOSCOPIST: R.  Garfield Cornea, MD FACP FACG REFERRED BY:  Conni Slipper, M.D. PROCEDURE DATE:  11/18/2013 PROCEDURE:     Colonoscopy with multiple snare polypectomies, polyp ablation  INDICATIONS: hematochezia  INFORMED CONSENT:  The risks, benefits, alternatives and imponderables including but not limited to bleeding, perforation as well as the possibility of a missed lesion have been reviewed.  The potential for biopsy, lesion removal, etc. have also been discussed.  Questions have been answered.  All parties agreeable. Please see the history and physical in the medical record for more information.  MEDICATIONS: Versed 5 mg IV and Demerol 100 mg IV in divided doses. Phenergan 25 mg IV  DESCRIPTION OF PROCEDURE:  After a digital rectal exam was performed, the EG-[id number] ([id number]) and EC-[id number] ([id number]) colonoscope was advanced from the anus through the rectum and colon to the area of the cecum, ileocecal valve and appendiceal orifice. The cecum was deeply intubated.  These structures were well-seen and photographed for the record.  From the level of the cecum and ileocecal valve, the scope was slowly and cautiously withdrawn. The mucosal surfaces were carefully surveyed utilizing scope tip deflection to facilitate fold flattening as needed.  The scope was pulled down into the rectum where a thorough examination including retroflexion was performed.    FINDINGS:  Overall adequate preparation even though patient consumed regular food yesterday against our instructions. Normal rectum. (4) 6 mm polyps about the ileocecal valve and (3) 6 mm polyps in the distal transverse colon; otherwise, the remainder of colonic mucosa appeared normal.  There was (1) 4 mm polyp in the sigmoid  segment which was ablated with the hot snare.  THERAPEUTIC / DIAGNOSTIC MANEUVERS PERFORMED:  The above-mentioned polyps were either hot or cold snare removed.  COMPLICATIONS: None  CECAL WITHDRAWAL TIME:  20 minutes  IMPRESSION:  Multiple colonic polyps removed/ablated as described above  RECOMMENDATIONS: Followup on pathology. See EGD report.   _______________________________ eSigned:  R. Garfield Cornea, MD FACP [facility] 11/18/2013 2:55 PM   CC:

## 2013-11-18 NOTE — H&P (View-Only) (Signed)
   Primary Care Physician:  FANTA,TESFAYE, MD Primary Gastroenterologist:  Dr. Rourk   Chief Complaint  Patient presents with  . Abdominal Pain    HPI:   Bryan [name] presents today with a history of adenomatous colon polyps, last colonoscopy in Feb 2012 by Dr. Rourk. Remote EGD in 2006.  Somewhat diffuse abdominal pain, level of umbilicus to lower abdomen, radiating up sides.  onset a few weeks ago. Presented to ED. Noted as constant. Early satiety. No N/V. States saw black stool about 2 days ago. Next day light brown. Denies constipation. Will have loose stool for a few days then resolve, then start back up. 2-3 weeks ago small amount hematochezia. Abdominal pain improved. Intermittent now. Ibuprofen daily. No aspirin powders. Appetite waxes and wanes. Nexium for GERD.   CT in ED showed diffuse bowel wall thickening of sigmoid colon.    Past Medical History  Diagnosis Date  . Chronic pain     with leg and back pain (disc problem)  . History of upper GI x-ray series     to follow showed large duodenal ulcer H pylori serologies were negative  . Acid reflux   . Hypertension   . Asthma   . COPD (chronic obstructive pulmonary disease)     Past Surgical History  Procedure Laterality Date  . Esophagogastroduodenoscopy  1/06    Dr. Rourk:Distal esophageal erosions,U-shaped stomach,marked erosions and edema of the bulb without discrete ulcer disease.   . Back surgery  7/05; 5/09     Dr.Hirsch,3 lumbar  . Neck surgery  2/09  . Back surgery    . Colonoscopy  Feb 2012    Dr. Rourk: normal rectum, pedunculate polyp removed but not recovered    Current Outpatient Prescriptions  Medication Sig Dispense Refill  . esomeprazole (NEXIUM) 40 MG capsule Take 40 mg by mouth daily before breakfast.        . ibuprofen (ADVIL,MOTRIN) 800 MG tablet Take 800 mg by mouth 3 (three) times daily as needed. pain      . meloxicam (MOBIC) 7.5 MG tablet Take 1 tablet (7.5 mg total) by mouth daily.  20  tablet  0  . PROAIR HFA 108 (90 BASE) MCG/ACT inhaler Inhale 2 puffs into the lungs every 6 (six) hours as needed.       . polyethylene glycol-electrolytes (TRILYTE) 420 G solution Take 4,000 mLs by mouth as directed.  4000 mL  0   Current Facility-Administered Medications  Medication Dose Route Frequency Provider Last Rate Last Dose  . [START ON 11/18/2013] promethazine (PHENERGAN) injection 25 mg  25 mg Intravenous Once Robert M Rourk, MD        Allergies as of 11/11/2013  . (No Known Allergies)    Family History  Problem Relation Age of Onset  . Cancer Father   . Asthma Mother   . Colon cancer Neg Hx     History   Social History  . Marital Status: Single    Spouse Name: N/A    Number of Children: 2  . Years of Education: N/A   Occupational History  . disabled    Social History Main Topics  . Smoking status: Current Every Day Smoker -- 0.50 packs/day for 40 years    Types: Cigarettes  . Smokeless tobacco: Not on file  . Alcohol Use: No  . Drug Use: No     Comment: Last used Feb 2015  . Sexual Activity: Not on file   Other Topics Concern  .   Not on file   Social History Narrative   Lives with girlfriend, is on disability for history of back injuries.    Review of Systems: Gen: see HPI  CV: Denies chest pain, heart palpitations, peripheral edema, syncope.  Resp: +DOE GI: see HPI GU : Denies urinary burning, urinary frequency, urinary hesitancy MS: +joint pain, arthritis Derm: Denies rash, itching, dry skin Psych: Denies depression, anxiety, memory loss, and confusion Heme: Denies bruising, bleeding, and enlarged lymph nodes.  Physical Exam: BP 145/85  Pulse 74  Temp(Src) 97.9 F (36.6 C) (Oral)  Wt 119 lb (53.978 kg) General:   Alert and oriented. Pleasant and cooperative. Well-nourished and well-developed.  Head:  Normocephalic and atraumatic. Eyes:  Without icterus, sclera clear and conjunctiva pink.  Ears:  Normal auditory acuity. Nose:  No  deformity, discharge,  or lesions. Mouth:  No deformity or lesions, oral mucosa pink. Poor dentition.  Neck:  Supple, without mass or thyromegaly. Lungs:  Clear to auscultation bilaterally. No wheezes, rales, or rhonchi. No distress.  Heart:  S1, S2 present without murmurs appreciated.  Abdomen:  +BS, soft, non-tender and non-distended. No HSM noted. No guarding or rebound. No masses appreciated.  Rectal:  Deferred  Msk:  Symmetrical without gross deformities. Normal posture. Extremities:  Without clubbing or edema. Neurologic:  Alert and  oriented x4;  grossly normal neurologically. Skin:  Intact without significant lesions or rashes. Cervical Nodes:  No significant cervical adenopathy. Psych:  Alert and cooperative. Normal mood and affect.   Feb 2015 CT abd/pelvis with contrast:  IMPRESSION:  No acute abnormality identified in the abdomen and pelvis. Diffuse  bowel wall thickening of sigmoid colon. Further evaluation with  direct visualization is recommended to exclude neoplasm.   Feb 2015 US of abdomen:  Normal 

## 2013-11-18 NOTE — Interval H&P Note (Signed)
History and Physical Interval Note:  11/18/2013 1:51 PM  Bryan [name]  has presented today for surgery, with the diagnosis of ABD PAIN,DIARRHEA,RECTAL BLEEDING  The various methods of treatment have been discussed with the patient and family. After consideration of risks, benefits and other options for treatment, the patient has consented to  Procedure(s) with comments: COLONOSCOPY WITH ESOPHAGOGASTRODUODENOSCOPY (EGD) (N/A) - 1:30PM as a surgical intervention .  The patient's history has been reviewed, patient examined, no change in status, stable for surgery.  I have reviewed the patient's chart and labs.  Questions were answered to the patient's satisfaction.     Patient reports abdominal pain resolved. EGD and colonoscopy per plan.The risks, benefits, limitations, imponderables and alternatives regarding both EGD and colonoscopy have been reviewed with the patient. Questions have been answered. All parties agreeable.   Manus Rudd

## 2013-11-18 NOTE — Discharge Instructions (Signed)
EGD Discharge instructions Please read the instructions outlined below and refer to this sheet in the next few weeks. These discharge instructions provide you with general information on caring for yourself after you leave the hospital. Your doctor may also give you specific instructions. While your treatment has been planned according to the most current medical practices available, unavoidable complications occasionally occur. If you have any problems or questions after discharge, please call your doctor. ACTIVITY  You may resume your regular activity but move at a slower pace for the next 24 hours.   Take frequent rest periods for the next 24 hours.   Walking will help expel (get rid of) the air and reduce the bloated feeling in your abdomen.   No driving for 24 hours (because of the anesthesia (medicine) used during the test).   You may shower.   Do not sign any important legal documents or operate any machinery for 24 hours (because of the anesthesia used during the test).  NUTRITION  Drink plenty of fluids.   You may resume your normal diet.   Begin with a light meal and progress to your normal diet.   Avoid alcoholic beverages for 24 hours or as instructed by your caregiver.  MEDICATIONS  You may resume your normal medications unless your caregiver tells you otherwise.  WHAT YOU CAN EXPECT TODAY  You may experience abdominal discomfort such as a feeling of fullness or gas pains.  FOLLOW-UP  Your doctor will discuss the results of your test with you.  SEEK IMMEDIATE MEDICAL ATTENTION IF ANY OF THE FOLLOWING OCCUR:  Excessive nausea (feeling sick to your stomach) and/or vomiting.   Severe abdominal pain and distention (swelling).   Trouble swallowing.   Temperature over 101 F (37.8 C).  Rectal bleeding or vomiting of blood.   Colonoscopy Discharge Instructions  Read the instructions outlined below and refer to this sheet in the next few weeks. These discharge  instructions provide you with general information on caring for yourself after you leave the hospital. Your doctor may also give you specific instructions. While your treatment has been planned according to the most current medical practices available, unavoidable complications occasionally occur. If you have any problems or questions after discharge, call Dr. Gala Romney at [phone]. ACTIVITY  You may resume your regular activity, but move at a slower pace for the next 24 hours.   Take frequent rest periods for the next 24 hours.   Walking will help get rid of the air and reduce the bloated feeling in your belly (abdomen).   No driving for 24 hours (because of the medicine (anesthesia) used during the test).    Do not sign any important legal documents or operate any machinery for 24 hours (because of the anesthesia used during the test).  NUTRITION  Drink plenty of fluids.   You may resume your normal diet as instructed by your doctor.   Begin with a light meal and progress to your normal diet. Heavy or fried foods are harder to digest and may make you feel sick to your stomach (nauseated).   Avoid alcoholic beverages for 24 hours or as instructed.  MEDICATIONS  You may resume your normal medications unless your doctor tells you otherwise.  WHAT YOU CAN EXPECT TODAY  Some feelings of bloating in the abdomen.   Passage of more gas than usual.   Spotting of blood in your stool or on the toilet paper.  IF YOU HAD POLYPS REMOVED DURING THE COLONOSCOPY:  No aspirin products for 7 days or as instructed.   No alcohol for 7 days or as instructed.   Eat a soft diet for the next 24 hours.  FINDING OUT THE RESULTS OF YOUR TEST Not all test results are available during your visit. If your test results are not back during the visit, make an appointment with your caregiver to find out the results. Do not assume everything is normal if you have not heard from your caregiver or the medical  facility. It is important for you to follow up on all of your test results.  SEEK IMMEDIATE MEDICAL ATTENTION IF:  You have more than a spotting of blood in your stool.   Your belly is swollen (abdominal distention).   You are nauseated or vomiting.   You have a temperature over 101.   You have abdominal pain or discomfort that is severe or gets worse throughout the day.    Further recommendations to follow pending review of pathology report

## 2013-11-18 NOTE — Op Note (Signed)
[facility] [address] [place], [address]   ENDOSCOPY PROCEDURE REPORT  PATIENT: [name], [name]  MR#: [id number] BIRTHDATE: [birth year] , 57  yrs. old GENDER: Male ENDOSCOPIST: R.  Garfield Cornea, MD FACP FACG REFERRED BY:  Conni Slipper, M.D. PROCEDURE DATE:  11/18/2013 PROCEDURE:     EGD with gastric biopsy  INDICATIONS:    history of melena; self limiting abdominal pain  INFORMED CONSENT:   The risks, benefits, limitations, alternatives and imponderables have been discussed.  The potential for biopsy, esophogeal dilation, etc. have also been reviewed.  Questions have been answered.  All parties agreeable.  Please see the history and physical in the medical record for more information.  MEDICATIONS:    Versed 3 mg IV and Demerol  75 mg IV in divided doses. Phenergan 25 mg IV. Xylocaine gel orally  DESCRIPTION OF PROCEDURE:   The FM-[id number] ([id number])  endoscope was introduced through the mouth and advanced to the second portion of the duodenum without difficulty or limitations.  The mucosal surfaces were surveyed very carefully during advancement of the scope and upon withdrawal.  Retroflexion view of the proximal stomach and esophagogastric junction was performed.      FINDINGS: Normal esophagus. Stomach empty. Gastric mucosa and normal scattered erosions mottling, friability. No ulcer or infiltrating process. Patent pylorus. Examination bulb and second portion revealed nodularity of the bulb with multiple 1-2 mm erosions  THERAPEUTIC / DIAGNOSTIC MANEUVERS PERFORMED:  biopsies of the abnormal gastric mucosa taken for histologic study   COMPLICATIONS:  None  IMPRESSION:   Abnormal stomach and duodenal bulb as described above-status post gastric biopsy  RECOMMENDATIONS:  Followup on pathology. See colonoscopy report.    _______________________________ R. Garfield Cornea, MD FACP [facility] eSigned:  R. Garfield Cornea, MD FACP [facility] 11/18/2013 2:16  PM     CC:  PATIENT NAME:  Bryan [name], Bryan [name] MR#: [id number]

## 2013-11-20 ENCOUNTER — Encounter: Payer: Self-pay | Admitting: Internal Medicine

## 2013-11-21 ENCOUNTER — Encounter (HOSPITAL_COMMUNITY): Payer: Self-pay | Admitting: Internal Medicine

## 2014-01-21 DIAGNOSIS — R7309 Other abnormal glucose: Secondary | ICD-10-CM | POA: Diagnosis not present

## 2014-01-21 DIAGNOSIS — M1990 Unspecified osteoarthritis, unspecified site: Secondary | ICD-10-CM | POA: Diagnosis not present

## 2014-01-21 DIAGNOSIS — M549 Dorsalgia, unspecified: Secondary | ICD-10-CM | POA: Diagnosis not present

## 2014-01-21 DIAGNOSIS — R799 Abnormal finding of blood chemistry, unspecified: Secondary | ICD-10-CM | POA: Diagnosis not present

## 2014-01-21 DIAGNOSIS — K219 Gastro-esophageal reflux disease without esophagitis: Secondary | ICD-10-CM | POA: Diagnosis not present

## 2014-01-21 DIAGNOSIS — Z125 Encounter for screening for malignant neoplasm of prostate: Secondary | ICD-10-CM | POA: Diagnosis not present

## 2014-01-21 DIAGNOSIS — Z0000 Encounter for general adult medical examination without abnormal findings: Secondary | ICD-10-CM | POA: Diagnosis not present

## 2014-02-20 ENCOUNTER — Other Ambulatory Visit (HOSPITAL_COMMUNITY): Payer: Self-pay | Admitting: Internal Medicine

## 2014-02-20 DIAGNOSIS — N390 Urinary tract infection, site not specified: Secondary | ICD-10-CM | POA: Diagnosis not present

## 2014-02-20 DIAGNOSIS — J449 Chronic obstructive pulmonary disease, unspecified: Secondary | ICD-10-CM | POA: Diagnosis not present

## 2014-02-25 ENCOUNTER — Ambulatory Visit (HOSPITAL_COMMUNITY)
Admission: RE | Admit: 2014-02-25 | Discharge: 2014-02-25 | Disposition: A | Discharge status: Home / Self Care | Payer: Medicare Other | Source: Ambulatory Visit | Attending: Internal Medicine | Admitting: Internal Medicine

## 2014-02-25 DIAGNOSIS — N390 Urinary tract infection, site not specified: Secondary | ICD-10-CM | POA: Diagnosis not present

## 2014-02-25 DIAGNOSIS — I10 Essential (primary) hypertension: Secondary | ICD-10-CM | POA: Diagnosis not present

## 2014-03-05 ENCOUNTER — Encounter: Payer: Self-pay | Admitting: Internal Medicine

## 2014-03-07 ENCOUNTER — Other Ambulatory Visit (HOSPITAL_COMMUNITY): Payer: Self-pay | Admitting: Internal Medicine

## 2014-03-07 DIAGNOSIS — R935 Abnormal findings on diagnostic imaging of other abdominal regions, including retroperitoneum: Secondary | ICD-10-CM

## 2014-03-07 DIAGNOSIS — N390 Urinary tract infection, site not specified: Secondary | ICD-10-CM

## 2014-03-07 DIAGNOSIS — N12 Tubulo-interstitial nephritis, not specified as acute or chronic: Secondary | ICD-10-CM

## 2014-03-11 ENCOUNTER — Encounter (HOSPITAL_COMMUNITY): Payer: Self-pay

## 2014-03-11 ENCOUNTER — Ambulatory Visit (HOSPITAL_COMMUNITY)
Admission: RE | Admit: 2014-03-11 | Discharge: 2014-03-11 | Disposition: A | Discharge status: Home / Self Care | Payer: Medicare Other | Source: Ambulatory Visit | Attending: Internal Medicine | Admitting: Internal Medicine

## 2014-03-11 DIAGNOSIS — R1084 Generalized abdominal pain: Secondary | ICD-10-CM | POA: Insufficient documentation

## 2014-03-11 DIAGNOSIS — R935 Abnormal findings on diagnostic imaging of other abdominal regions, including retroperitoneum: Secondary | ICD-10-CM | POA: Insufficient documentation

## 2014-03-11 DIAGNOSIS — R109 Unspecified abdominal pain: Secondary | ICD-10-CM | POA: Diagnosis not present

## 2014-03-11 MED ORDER — IOHEXOL 300 MG/ML  SOLN
100.0000 mL | Freq: Once | INTRAMUSCULAR | Status: AC | PRN
  Administered 2014-03-11: 100 mL via INTRAVENOUS

## 2014-04-09 ENCOUNTER — Telehealth: Payer: Self-pay | Admitting: Gastroenterology

## 2014-04-09 ENCOUNTER — Encounter: Payer: Self-pay | Admitting: Gastroenterology

## 2014-04-09 ENCOUNTER — Ambulatory Visit: Payer: Medicare Other | Admitting: Gastroenterology

## 2014-04-09 ENCOUNTER — Emergency Department (HOSPITAL_COMMUNITY)
Admission: EM | Admit: 2014-04-09 | Discharge: 2014-04-09 | Disposition: A | Discharge status: Home / Self Care | Payer: Medicare Other | Attending: Emergency Medicine | Admitting: Emergency Medicine

## 2014-04-09 DIAGNOSIS — K219 Gastro-esophageal reflux disease without esophagitis: Secondary | ICD-10-CM | POA: Insufficient documentation

## 2014-04-09 DIAGNOSIS — Z791 Long term (current) use of non-steroidal anti-inflammatories (NSAID): Secondary | ICD-10-CM | POA: Diagnosis not present

## 2014-04-09 DIAGNOSIS — Z79899 Other long term (current) drug therapy: Secondary | ICD-10-CM | POA: Insufficient documentation

## 2014-04-09 DIAGNOSIS — J4489 Other specified chronic obstructive pulmonary disease: Secondary | ICD-10-CM | POA: Insufficient documentation

## 2014-04-09 DIAGNOSIS — J449 Chronic obstructive pulmonary disease, unspecified: Secondary | ICD-10-CM | POA: Diagnosis not present

## 2014-04-09 DIAGNOSIS — F17200 Nicotine dependence, unspecified, uncomplicated: Secondary | ICD-10-CM | POA: Insufficient documentation

## 2014-04-09 DIAGNOSIS — I10 Essential (primary) hypertension: Secondary | ICD-10-CM | POA: Diagnosis not present

## 2014-04-09 DIAGNOSIS — L299 Pruritus, unspecified: Secondary | ICD-10-CM | POA: Insufficient documentation

## 2014-04-09 DIAGNOSIS — G8929 Other chronic pain: Secondary | ICD-10-CM | POA: Diagnosis not present

## 2014-04-09 DIAGNOSIS — L02419 Cutaneous abscess of limb, unspecified: Secondary | ICD-10-CM | POA: Insufficient documentation

## 2014-04-09 DIAGNOSIS — L03119 Cellulitis of unspecified part of limb: Principal | ICD-10-CM

## 2014-04-09 HISTORY — DX: Benign neoplasm, unspecified site: D36.9

## 2014-04-09 MED ORDER — BACITRACIN-NEOMYCIN-POLYMYXIN 400-5-5000 EX OINT
TOPICAL_OINTMENT | CUTANEOUS | Status: AC
  Filled 2014-04-09: qty 1

## 2014-04-09 MED ORDER — SULFAMETHOXAZOLE-TRIMETHOPRIM 800-160 MG PO TABS: 1.0000 | ORAL_TABLET | Freq: Two times a day (BID) | ORAL | Status: DC

## 2014-04-09 MED ORDER — BACITRACIN-NEOMYCIN-POLYMYXIN 400-5-5000 EX OINT
TOPICAL_OINTMENT | Freq: Once | CUTANEOUS | Status: AC
  Administered 2014-04-09: 1 via TOPICAL

## 2014-04-09 MED ORDER — SULFAMETHOXAZOLE-TMP DS 800-160 MG PO TABS
1.0000 | ORAL_TABLET | Freq: Once | ORAL | Status: AC
  Administered 2014-04-09: 1 via ORAL
  Filled 2014-04-09: qty 1

## 2014-04-09 NOTE — Discharge Instructions (Signed)
Soak in hot soapy water. Simple dressing with antibiotic ointment. Antibiotic pills for 5 days

## 2014-04-09 NOTE — Telephone Encounter (Signed)
Pt was a no show

## 2014-04-09 NOTE — ED Notes (Signed)
Raise dark brown area noted to inner right ankle, about the size of nickel with whitehead observed in center of bite. No drainage noted.

## 2014-04-09 NOTE — Telephone Encounter (Signed)
Mailed letter

## 2014-04-09 NOTE — Telephone Encounter (Signed)
Noted. Patient was evaluated for non-GI reasons in the ER today.

## 2014-04-09 NOTE — ED Provider Notes (Signed)
CSN: [id number]     Arrival date & time 04/09/14  1234 History  This chart was scribed for Nat Christen, MD by Peyton Bottoms, ED Scribe. This patient was seen in room APFT22/APFT22 and the patient's care was started at 2:23 PM.   Chief Complaint  Patient presents with  . Insect Bite    The history is provided by the patient. No language interpreter was used.    HPI Comments: Bryan [name] is a 58 y.o. male who presents to the Emergency Department complaining of a persistent and progressively worsening  itchy bump on his right foot for the past week that started draining this morning.  He denies any foreign body or known injuries but states he may have been bit by a mosquito. No fever or chills. Past Medical History  Diagnosis Date  . Chronic pain     with leg and back pain (disc problem)  . History of upper GI x-ray series     to follow showed large duodenal ulcer H pylori serologies were negative  . Acid reflux   . Hypertension   . Asthma   . COPD (chronic obstructive pulmonary disease)   . Tubular adenoma    Past Surgical History  Procedure Laterality Date  . Esophagogastroduodenoscopy  1/06    Dr. Volney American esophageal erosions,U-shaped stomach,marked erosions and edema of the bulb without discrete ulcer disease.   . Back surgery  7/05; 5/09     Dr.Hirsch,3 lumbar  . Neck surgery  2/09  . Back surgery    . Colonoscopy  Feb 2012    Dr. Gala Romney: normal rectum, pedunculate polyp removed but not recovered  . Colonoscopy with esophagogastroduodenoscopy (egd) N/A 11/18/2013    Dr.Rourk- tcs= normal rectum, multipe polyps about the ileocecal valve and distal transverse colon o/w the remainder of the colonic mucosa appeared normal bx= tubular adenoma. EGD= normal esophagus, stomach with scattered erosions mottling, friablility, no ulcer or infiltrating process patent pylorus bx= chronic inflammation   Family History  Problem Relation Age of Onset  . Cancer Father   . Asthma Mother    . Colon cancer Neg Hx    History  Substance Use Topics  . Smoking status: Current Every Day Smoker -- 0.50 packs/day for 40 years    Types: Cigarettes  . Smokeless tobacco: Never Used  . Alcohol Use: No    Review of Systems  A complete 10 system review of systems was obtained and all systems are negative except as noted in the HPI and PMH.    Allergies  Review of patient's allergies indicates no known allergies.  Home Medications   Prior to Admission medications   Medication Sig Start Date End Date Taking? Authorizing Provider  esomeprazole (NEXIUM) 40 MG capsule Take 40 mg by mouth daily before breakfast.     Yes Historical Provider, MD  ibuprofen (ADVIL,MOTRIN) 800 MG tablet Take 800 mg by mouth 3 (three) times daily as needed. pain 09/14/13  Yes Historical Provider, MD  meloxicam (MOBIC) 7.5 MG tablet Take 1 tablet (7.5 mg total) by mouth daily. 08/28/13  Yes Tatyana A Kirichenko, PA-C  PROAIR HFA 108 (90 BASE) MCG/ACT inhaler Inhale 2 puffs into the lungs every 6 (six) hours as needed for wheezing or shortness of breath.  10/07/13  Yes Historical Provider, MD  sulfamethoxazole-trimethoprim (SEPTRA DS) 800-160 MG per tablet Take 1 tablet by mouth 2 (two) times daily. 04/09/14   Nat Christen, MD   Triage Vitals: BP 166/89  Pulse 75  Temp(Src) 98.6 F (37 C) (Oral)  Resp 18  Ht 5' 3" (1.6 m)  Wt 122 lb (55.339 kg)  BMI 21.62 kg/m2  SpO2 99% Physical Exam  Nursing note and vitals reviewed. Constitutional: He is oriented to person, place, and time. He appears well-developed and well-nourished. No distress.  HENT:  Head: Normocephalic and atraumatic.  Eyes: Conjunctivae and EOM are normal.  Neck: Neck supple. No tracheal deviation present.  Cardiovascular: Normal rate.   Pulmonary/Chest: Effort normal. No respiratory distress.  Musculoskeletal: Normal range of motion.  Neurological: He is alert and oriented to person, place, and time.  Skin: Skin is warm and dry.  1cm  raised area with a central core located in the  medial aspect of right ankle, inferior to the medial malleolus  Psychiatric: He has a normal mood and affect. His behavior is normal.    ED Course  INCISION AND DRAINAGE Date/Time: 04/09/2014 2:38 PM Performed by: Nat Christen Authorized by: Nat Christen Comments: Procedure note: Performed at 1430.   Abscess unroofed with #11 blade. Moderate amount of serous drainage excised.  Sterile dressing applied.   (including critical care time)  DIAGNOSTIC STUDIES  COORDINATION OF CARE: 2:23 PM- Will give antibiotics. Pt advised of plan for treatment and pt agrees.   MDM   Final diagnoses:  Abscess of ankle   I and D performed with good results.  Rx Septra DS.   I personally performed the services described in this documentation, which was scribed in my presence. The recorded information has been reviewed and is accurate.      Nat Christen, MD 04/09/14 1440

## 2014-04-09 NOTE — ED Notes (Signed)
Patient c/o possible bug bite to inner, lateral aspect of right ankle. Per patient "bump" appeared x1 week ago and is progressively getting worse. Patient reports serosanguinous drainage and itching. Denies any fevers.

## 2014-05-09 ENCOUNTER — Ambulatory Visit (INDEPENDENT_AMBULATORY_CARE_PROVIDER_SITE_OTHER): Payer: Medicare Other | Admitting: Urology

## 2014-05-09 DIAGNOSIS — N402 Nodular prostate without lower urinary tract symptoms: Secondary | ICD-10-CM

## 2014-05-09 DIAGNOSIS — R350 Frequency of micturition: Secondary | ICD-10-CM | POA: Diagnosis not present

## 2014-05-09 DIAGNOSIS — R82998 Other abnormal findings in urine: Secondary | ICD-10-CM | POA: Diagnosis not present

## 2014-05-20 ENCOUNTER — Ambulatory Visit: Payer: Medicare Other | Admitting: Gastroenterology

## 2014-05-20 ENCOUNTER — Encounter: Payer: Self-pay | Admitting: Gastroenterology

## 2014-05-22 DIAGNOSIS — I10 Essential (primary) hypertension: Secondary | ICD-10-CM | POA: Diagnosis not present

## 2014-05-22 DIAGNOSIS — M549 Dorsalgia, unspecified: Secondary | ICD-10-CM | POA: Diagnosis not present

## 2014-05-22 DIAGNOSIS — F17200 Nicotine dependence, unspecified, uncomplicated: Secondary | ICD-10-CM | POA: Diagnosis not present

## 2014-05-22 DIAGNOSIS — K219 Gastro-esophageal reflux disease without esophagitis: Secondary | ICD-10-CM | POA: Diagnosis not present

## 2014-05-22 DIAGNOSIS — J410 Simple chronic bronchitis: Secondary | ICD-10-CM | POA: Diagnosis not present

## 2014-06-19 ENCOUNTER — Encounter: Payer: Self-pay | Admitting: Gastroenterology

## 2014-06-19 ENCOUNTER — Ambulatory Visit (INDEPENDENT_AMBULATORY_CARE_PROVIDER_SITE_OTHER): Payer: Medicare Other | Admitting: Gastroenterology

## 2014-06-19 VITALS — BP 110/74 | HR 100 | Temp 98.0°F | Ht 63.0 in | Wt 115.8 lb

## 2014-06-19 DIAGNOSIS — R1319 Other dysphagia: Secondary | ICD-10-CM

## 2014-06-19 DIAGNOSIS — R197 Diarrhea, unspecified: Secondary | ICD-10-CM

## 2014-06-19 DIAGNOSIS — K2100 Gastro-esophageal reflux disease with esophagitis, without bleeding: Secondary | ICD-10-CM

## 2014-06-19 DIAGNOSIS — R1013 Epigastric pain: Secondary | ICD-10-CM | POA: Diagnosis not present

## 2014-06-19 DIAGNOSIS — R1310 Dysphagia, unspecified: Secondary | ICD-10-CM | POA: Insufficient documentation

## 2014-06-19 DIAGNOSIS — R1314 Dysphagia, pharyngoesophageal phase: Secondary | ICD-10-CM

## 2014-06-19 MED ORDER — ESOMEPRAZOLE MAGNESIUM 40 MG PO CPDR: 40.0000 mg | DELAYED_RELEASE_CAPSULE | Freq: Two times a day (BID) | ORAL | Status: DC

## 2014-06-19 NOTE — Assessment & Plan Note (Signed)
Worsening of chronic intermittent diarrhea. Recent antibiotic use. Check for C. difficile.

## 2014-06-19 NOTE — Progress Notes (Signed)
      Primary Care Physician: Rosita Fire, MD  Primary Gastroenterologist:  Garfield Cornea, MD   Chief Complaint  Patient presents with  . Follow-up  . Dysphagia    HPI: Bryan [name] is a 58 y.o. male here for follow-up. He missed his appointment in 03/2014 due to being in the ER for non-GI reasons. In March he had an EGD for history of melena, abdominal pain. He was noted to have scattered erosions, mottling, friability of the stomach. IFC showed minimal chronic inflammation. Colonoscopy at the same time for history of polyps showed multiple colonic polyps, path - tubular adenomas. Next colonoscopy in 5 years per  Once per week has discomfort in substernal region. Solid food dysphagia, feels like in throat area. Used to have it after his neck surgery several years ago but it went away up until the last couple of months. No n/v. Pp epigastric pain. BM, [place] 7, early AM 3-4 times per day. Black stools. No recent Pepto-Bismol. Ibuprofen 833m daily. If takes couple per day, stomach burns. Off Mobic for two months. Swallowing issues for few months. SMZ/TMP in July.  Current Outpatient Prescriptions  Medication Sig Dispense Refill  . esomeprazole (NEXIUM) 40 MG capsule Take 40 mg by mouth daily before breakfast.        . ibuprofen (ADVIL,MOTRIN) 800 MG tablet Take 800 mg by mouth 3 (three) times daily as needed. pain      . lisinopril (PRINIVIL,ZESTRIL) 10 MG tablet Take 10 mg by mouth daily.       .Marland KitchenPROAIR HFA 108 (90 BASE) MCG/ACT inhaler Inhale 2 puffs into the lungs every 6 (six) hours as needed for wheezing or shortness of breath.        No current facility-administered medications for this visit.    Allergies as of 06/19/2014  . (No Known Allergies)    ROS:  General: Negative for anorexia, weight loss, fever, chills, fatigue, weakness. ENT: Negative for hoarseness, difficulty swallowing , nasal congestion. CV: Negative for chest pain, angina, palpitations, dyspnea on  exertion, peripheral edema.  Respiratory: Negative for dyspnea at rest, dyspnea on exertion, cough, sputum, wheezing.  GI: See history of present illness. GU:  Negative for dysuria, hematuria, urinary incontinence, urinary frequency, nocturnal urination.  Endo: Negative for unusual weight change.    Physical Examination:   BP 110/74  Pulse 100  Temp(Src) 98 F (36.7 C) (Oral)  Ht _0  (1.6 m)  Wt 115 lb 12.8 oz (52.527 kg)  BMI 20.52 kg/m2  General: Well-nourished, well-developed in no acute distress.  Eyes: No icterus. Mouth: Oropharyngeal mucosa moist and pink , no lesions erythema or exudate. Lungs: Clear to auscultation bilaterally.  Heart: Regular rate and rhythm, no murmurs rubs or gallops.  Abdomen: Bowel sounds are normal, nontender, nondistended, no hepatosplenomegaly or masses, no abdominal bruits or hernia , no rebound or guarding.   Extremities: No lower extremity edema. No clubbing or deformities. Neuro: Alert and oriented x 4   Skin: Warm and dry, no jaundice.   Psych: Alert and cooperative, normal mood and affect.

## 2014-06-19 NOTE — Assessment & Plan Note (Signed)
Ongoing abdominal pain worse with meals. Complains of black stools. EGD findings as outlined above. Recheck labs.

## 2014-06-19 NOTE — Patient Instructions (Signed)
1. Please have your labs and stools done. 2. Xray of your esophagus as planned. 3. Increase nexium to once before breakfast and once before evening meal for the next 1-2 months, then go back to once daily.

## 2014-06-19 NOTE — Assessment & Plan Note (Signed)
Complains of recent onset dysphagia. No evidence of esophageal stricture at time of EGD in March. No esophageal dilation at that time as patient did not have dysphagia at that time. Barium pill esophagram the near future. He also has substernal chest discomfort which he feels like this is reflux. We'll increase his Nexium to 40 mg twice daily for now.

## 2014-06-20 ENCOUNTER — Ambulatory Visit (INDEPENDENT_AMBULATORY_CARE_PROVIDER_SITE_OTHER): Payer: Medicare Other | Admitting: Urology

## 2014-06-20 ENCOUNTER — Other Ambulatory Visit: Payer: Self-pay | Admitting: Urology

## 2014-06-20 ENCOUNTER — Ambulatory Visit (INDEPENDENT_AMBULATORY_CARE_PROVIDER_SITE_OTHER): Payer: Medicare Other

## 2014-06-20 DIAGNOSIS — R350 Frequency of micturition: Secondary | ICD-10-CM

## 2014-06-20 DIAGNOSIS — N403 Nodular prostate with lower urinary tract symptoms: Secondary | ICD-10-CM | POA: Diagnosis not present

## 2014-06-20 DIAGNOSIS — N3941 Urge incontinence: Secondary | ICD-10-CM | POA: Diagnosis not present

## 2014-06-20 DIAGNOSIS — K921 Melena: Secondary | ICD-10-CM

## 2014-06-20 LAB — IFOBT (OCCULT BLOOD): IFOBT: POSITIVE

## 2014-06-20 NOTE — Progress Notes (Signed)
ifobt is positive.

## 2014-06-20 NOTE — Progress Notes (Signed)
cc'ed to pcp °

## 2014-06-24 ENCOUNTER — Other Ambulatory Visit (HOSPITAL_COMMUNITY): Payer: Medicare Other

## 2014-06-24 NOTE — Progress Notes (Signed)
Quick Note:  Remind patient to get labs done. ifobt + I had also wanted to do a Cdiff pcr but did not order it at [facility]. Please have patient complete. ______

## 2014-07-07 ENCOUNTER — Other Ambulatory Visit: Payer: Self-pay | Admitting: Gastroenterology

## 2014-07-07 ENCOUNTER — Other Ambulatory Visit: Payer: Self-pay

## 2014-07-07 DIAGNOSIS — R197 Diarrhea, unspecified: Secondary | ICD-10-CM

## 2014-07-08 DIAGNOSIS — R197 Diarrhea, unspecified: Secondary | ICD-10-CM | POA: Diagnosis not present

## 2014-07-08 DIAGNOSIS — K210 Gastro-esophageal reflux disease with esophagitis: Secondary | ICD-10-CM | POA: Diagnosis not present

## 2014-07-08 DIAGNOSIS — R1013 Epigastric pain: Secondary | ICD-10-CM | POA: Diagnosis not present

## 2014-07-08 DIAGNOSIS — R1314 Dysphagia, pharyngoesophageal phase: Secondary | ICD-10-CM | POA: Diagnosis not present

## 2014-07-08 LAB — CBC WITH DIFFERENTIAL/PLATELET
BASOS ABS: 0.1 10*3/uL (ref 0.0–0.1)
Basophils Relative: 2 % — ABNORMAL HIGH (ref 0–1)
EOS PCT: 2 % (ref 0–5)
Eosinophils Absolute: 0.1 10*3/uL (ref 0.0–0.7)
HCT: 49.2 % (ref 39.0–52.0)
Hemoglobin: 16.7 g/dL (ref 13.0–17.0)
Lymphocytes Relative: 41 % (ref 12–46)
Lymphs Abs: 2.2 10*3/uL (ref 0.7–4.0)
MCH: 30.5 pg (ref 26.0–34.0)
MCHC: 33.9 g/dL (ref 30.0–36.0)
MCV: 89.9 fL (ref 78.0–100.0)
Monocytes Absolute: 0.5 10*3/uL (ref 0.1–1.0)
Monocytes Relative: 10 % (ref 3–12)
NEUTROS ABS: 2.4 10*3/uL (ref 1.7–7.7)
Neutrophils Relative %: 45 % (ref 43–77)
PLATELETS: 292 10*3/uL (ref 150–400)
RBC: 5.47 MIL/uL (ref 4.22–5.81)
RDW: 15.3 % (ref 11.5–15.5)
WBC: 5.3 10*3/uL (ref 4.0–10.5)

## 2014-07-09 LAB — COMPREHENSIVE METABOLIC PANEL
ALT: 13 U/L (ref 0–53)
AST: 18 U/L (ref 0–37)
Albumin: 4.4 g/dL (ref 3.5–5.2)
Alkaline Phosphatase: 108 U/L (ref 39–117)
BILIRUBIN TOTAL: 0.3 mg/dL (ref 0.2–1.2)
BUN: 7 mg/dL (ref 6–23)
CO2: 28 mEq/L (ref 19–32)
Calcium: 9.5 mg/dL (ref 8.4–10.5)
Chloride: 104 mEq/L (ref 96–112)
Creat: 1.17 mg/dL (ref 0.50–1.35)
GLUCOSE: 70 mg/dL (ref 70–99)
Potassium: 4.1 mEq/L (ref 3.5–5.3)
Sodium: 140 mEq/L (ref 135–145)
Total Protein: 7.3 g/dL (ref 6.0–8.3)

## 2014-07-09 LAB — LIPASE: Lipase: 50 U/L (ref 0–75)

## 2014-07-09 NOTE — Progress Notes (Signed)
Quick Note:  Please let patient know his labs are good. No anemia. Nothing to explain abd pain. Await C.Diff PCR and BPE. ______

## 2014-07-13 LAB — CLOSTRIDIUM DIFFICILE BY PCR: CDIFFPCR: NOT DETECTED

## 2014-07-14 NOTE — Progress Notes (Signed)
Quick Note:  cdiff negative. When is his BPE scheduled for? ______

## 2014-07-14 NOTE — Progress Notes (Signed)
Quick Note:  Negative C.diff pcr. When is his BPE scheduled for? ______

## 2014-07-29 ENCOUNTER — Other Ambulatory Visit: Payer: Self-pay | Admitting: Urology

## 2014-07-29 DIAGNOSIS — N403 Nodular prostate with lower urinary tract symptoms: Secondary | ICD-10-CM

## 2014-08-06 ENCOUNTER — Ambulatory Visit (HOSPITAL_COMMUNITY)
Admission: RE | Admit: 2014-08-06 | Discharge: 2014-08-06 | Disposition: A | Discharge status: Home / Self Care | Payer: Medicare Other | Source: Ambulatory Visit | Attending: Gastroenterology | Admitting: Gastroenterology

## 2014-08-06 DIAGNOSIS — K219 Gastro-esophageal reflux disease without esophagitis: Secondary | ICD-10-CM | POA: Diagnosis not present

## 2014-08-06 DIAGNOSIS — R1319 Other dysphagia: Secondary | ICD-10-CM

## 2014-08-06 DIAGNOSIS — R1314 Dysphagia, pharyngoesophageal phase: Secondary | ICD-10-CM | POA: Diagnosis not present

## 2014-08-06 DIAGNOSIS — R1310 Dysphagia, unspecified: Secondary | ICD-10-CM

## 2014-08-21 DIAGNOSIS — J40 Bronchitis, not specified as acute or chronic: Secondary | ICD-10-CM | POA: Diagnosis not present

## 2014-08-21 DIAGNOSIS — L259 Unspecified contact dermatitis, unspecified cause: Secondary | ICD-10-CM | POA: Diagnosis not present

## 2014-08-21 NOTE — Progress Notes (Signed)
Quick Note:  Please let patient know his BPE was unremarkable except for evidence of prior anterior C4-C7 fusion. No esophageal stricture.  Would offer him a nonurgent OV with RMR only. ______

## 2014-08-21 NOTE — Progress Notes (Signed)
Quick Note:  Reason for OV is heme+stool. I suspect from previous stomach findings noted on EGD earlier this year. TCS up to date. No anemia.  Repeat CBC in 4 weeks.  ______

## 2014-08-22 ENCOUNTER — Encounter (HOSPITAL_COMMUNITY): Payer: Self-pay

## 2014-08-22 ENCOUNTER — Ambulatory Visit (HOSPITAL_COMMUNITY)
Admission: RE | Admit: 2014-08-22 | Discharge: 2014-08-22 | Disposition: A | Discharge status: Home / Self Care | Payer: Medicare Other | Source: Ambulatory Visit | Attending: Urology | Admitting: Urology

## 2014-08-22 DIAGNOSIS — R972 Elevated prostate specific antigen [PSA]: Secondary | ICD-10-CM | POA: Diagnosis not present

## 2014-08-22 DIAGNOSIS — N403 Nodular prostate with lower urinary tract symptoms: Secondary | ICD-10-CM | POA: Insufficient documentation

## 2014-08-22 MED ORDER — CEFTRIAXONE SODIUM 1 G IJ SOLR
1.0000 g | Freq: Once | INTRAMUSCULAR | Status: AC
  Administered 2014-08-22: 1 g via INTRAMUSCULAR

## 2014-08-22 MED ORDER — CEFTRIAXONE SODIUM 1 G IJ SOLR
INTRAMUSCULAR | Status: AC
  Administered 2014-08-22: 1 g via INTRAMUSCULAR
  Filled 2014-08-22: qty 10

## 2014-08-22 MED ORDER — LIDOCAINE HCL (PF) 2 % IJ SOLN
10.0000 mL | Freq: Once | INTRAMUSCULAR | Status: AC
  Administered 2014-08-22: 10 mL

## 2014-08-22 MED ORDER — GENTAMICIN SULFATE 40 MG/ML IJ SOLN
INTRAMUSCULAR | Status: AC
  Administered 2014-08-22: 80 mg via INTRAMUSCULAR
  Filled 2014-08-22: qty 2

## 2014-08-22 MED ORDER — LIDOCAINE HCL (PF) 2 % IJ SOLN
INTRAMUSCULAR | Status: AC
  Administered 2014-08-22: 10 mL
  Filled 2014-08-22: qty 10

## 2014-08-22 MED ORDER — GENTAMICIN SULFATE 40 MG/ML IJ SOLN
80.0000 mg | Freq: Once | INTRAMUSCULAR | Status: AC
  Administered 2014-08-22: 80 mg via INTRAMUSCULAR

## 2014-08-22 NOTE — Discharge Instructions (Signed)
Transrectal Ultrasound-Guided Biopsy A transrectal ultrasound-guided biopsy is a procedure to take samples of tissue from your prostate. Ultrasound images are used to guide the procedure. It is usually done to check the prostate gland for cancer. BEFORE THE PROCEDURE  Do not eat or drink after midnight on the night before your procedure.  Take medicines as your doctor tells you.  Your doctor may have you stop taking some medicines 5-7 days before the procedure.  You will be given an enema before your procedure. During an enema, a liquid is put into your butt (rectum) to clear out waste.  You may have lab tests the day of your procedure.  Make plans to have someone drive you home. PROCEDURE  You will be given medicine to help you relax before the procedure. An IV tube will be put into one of your veins. It will be used to give fluids and medicine.  You will be given medicine to reduce the risk of infection (antibiotic).  You will be placed on your side.  A probe with gel will be put in your butt. This is used to take pictures of your prostate and the area around it.  A medicine to numb the area is put into your prostate.  A biopsy needle is then inserted and guided to your prostate.  Samples of prostate tissue are taken. The needle is removed.  The samples are sent to a lab to be checked. Results are usually back in 2-3 days. AFTER THE PROCEDURE  You will be taken to a room where you will be watched until you are doing okay.  You may have some pain in the area around your butt. You will be given medicines for this.  You may be able to go home the same day. Sometimes, an overnight stay in the hospital is needed. Document Released: 08/17/2009 Document Revised: 09/03/2013 Document Reviewed: 04/17/2013 [facility] Patient Information 2015 [place], [place]. This information is not intended to replace advice given to you by your health care provider. Make sure you discuss any questions  you have with your health care provider.

## 2014-08-25 ENCOUNTER — Encounter: Payer: Self-pay | Admitting: Internal Medicine

## 2014-08-25 ENCOUNTER — Other Ambulatory Visit: Payer: Self-pay | Admitting: Gastroenterology

## 2014-08-25 DIAGNOSIS — Z87891 Personal history of nicotine dependence: Secondary | ICD-10-CM | POA: Diagnosis not present

## 2014-08-25 DIAGNOSIS — L259 Unspecified contact dermatitis, unspecified cause: Secondary | ICD-10-CM | POA: Diagnosis not present

## 2014-08-25 DIAGNOSIS — K921 Melena: Secondary | ICD-10-CM

## 2014-08-25 NOTE — Progress Notes (Signed)
APPT MADE AND LETTER SENT  °

## 2014-09-09 ENCOUNTER — Other Ambulatory Visit: Payer: Self-pay

## 2014-09-09 DIAGNOSIS — K921 Melena: Secondary | ICD-10-CM

## 2014-09-30 ENCOUNTER — Encounter: Payer: Self-pay | Admitting: Internal Medicine

## 2014-09-30 ENCOUNTER — Ambulatory Visit: Payer: Medicare Other | Admitting: Internal Medicine

## 2014-09-30 ENCOUNTER — Telehealth: Payer: Self-pay | Admitting: Internal Medicine

## 2014-09-30 NOTE — Telephone Encounter (Signed)
PATIENT WAS A NO SHOW 09/30/14  AND LETTER WAS SENT

## 2014-10-20 ENCOUNTER — Encounter: Payer: Self-pay | Admitting: Gastroenterology

## 2014-10-20 ENCOUNTER — Ambulatory Visit (INDEPENDENT_AMBULATORY_CARE_PROVIDER_SITE_OTHER): Payer: Medicare Other | Admitting: Gastroenterology

## 2014-10-20 VITALS — BP 153/92 | HR 98 | Temp 97.2°F | Ht 63.0 in | Wt 117.0 lb

## 2014-10-20 DIAGNOSIS — R195 Other fecal abnormalities: Secondary | ICD-10-CM | POA: Diagnosis not present

## 2014-10-20 NOTE — Patient Instructions (Signed)
1. Please have your labs done. We will call you with further instructions after results are back.

## 2014-10-20 NOTE — Progress Notes (Signed)
Primary Care Physician: Rosita Fire, MD  Primary Gastroenterologist:  Garfield Cornea, MD   Chief Complaint  Patient presents with  . Blood In Stools    HPI: Bryan [name] is a 59 y.o. male here for further evaluation of heme + stool. Patient last seen in October 2015. In March 2015 he had EGD for history of melena or abdominal pain. He had scattered erosions, mottling, friability of the stomach. Biopsy showed minimal chronic inflammation. Colonoscopy at the same time showed multiple colonic polyps, path tubular adenomas. Next colonoscopy due in 3 years.  At last OV complained of melena, dysphagia, epigastric pain, chronic diarrhea. Cdiff pcr was negative.  CMET, lipase, CBC was normal. BPE was unremarkable except for evidence of prior anterior fusion of C4-C7. ifobt was negative. Patient no showed for follow up OV.  Presents today stating he feels well. No further GI issues. No melena, brbpr. Has chronic pain back pain and takes ibuprofen as needed but not daily. Ran out of ibuprofen two days ago. RX for 827m from PCP. Never takes more than one in a 24 hour period of time.    Current Outpatient Prescriptions  Medication Sig Dispense Refill  . esomeprazole (NEXIUM) 40 MG capsule Take 1 capsule (40 mg total) by mouth 2 (two) times daily before a meal. 60 capsule 3  . lisinopril (PRINIVIL,ZESTRIL) 10 MG tablet Take 10 mg by mouth daily.     .Marland KitchenPROAIR HFA 108 (90 BASE) MCG/ACT inhaler Inhale 2 puffs into the lungs every 6 (six) hours as needed for wheezing or shortness of breath.     .Marland Kitchenibuprofen (ADVIL,MOTRIN) 800 MG tablet Take 800 mg by mouth 3 (three) times daily as needed. pain     No current facility-administered medications for this visit.    Allergies as of 10/20/2014  . (No Known Allergies)   Past Medical History  Diagnosis Date  . Chronic pain     with leg and back pain (disc problem)  . History of upper GI x-ray series     to follow showed large duodenal ulcer H  pylori serologies were negative  . Acid reflux   . Hypertension   . Asthma   . COPD (chronic obstructive pulmonary disease)   . Tubular adenoma    Past Surgical History  Procedure Laterality Date  . Esophagogastroduodenoscopy  1/06    Dr. RVolney Americanesophageal erosions,U-shaped stomach,marked erosions and edema of the bulb without discrete ulcer disease.   . Back surgery  7/05; 5/09     Dr.Hirsch,3 lumbar  . Neck surgery  2/09  . Back surgery    . Colonoscopy  Feb 2012    Dr. RGala Romney normal rectum, pedunculate polyp removed but not recovered  . Colonoscopy with esophagogastroduodenoscopy (egd) N/A 11/18/2013    Dr.Rourk- tcs= normal rectum, multipe polyps about the ileocecal valve and distal transverse colon o/w the remainder of the colonic mucosa appeared normal bx= tubular adenoma. EGD= normal esophagus, stomach with scattered erosions mottling, friablility, no ulcer or infiltrating process patent pylorus bx= chronic inflammation. next TCS 11/2016   Family History  Problem Relation Age of Onset  . Cancer Father   . Asthma Mother   . Colon cancer Neg Hx    History   Social History  . Marital Status: Single    Spouse Name: N/A    Number of Children: 2  . Years of Education: N/A   Occupational History  . disabled    Social History Main Topics  .  Smoking status: Current Every Day Smoker -- 0.50 packs/day for 40 years    Types: Cigarettes  . Smokeless tobacco: Never Used  . Alcohol Use: No  . Drug Use: No     Comment: Last used Feb 2015  . Sexual Activity: None   Other Topics Concern  . None   Social History Narrative   Lives with girlfriend, is on disability for history of back injuries.    ROS:  General: Negative for anorexia, weight loss, fever, chills, fatigue, weakness. ENT: Negative for hoarseness, difficulty swallowing , nasal congestion. CV: Negative for chest pain, angina, palpitations, dyspnea on exertion, peripheral edema.  Respiratory: Negative for  dyspnea at rest, dyspnea on exertion, cough, sputum, wheezing.  GI: See history of present illness. GU:  Negative for dysuria, hematuria, urinary incontinence, urinary frequency, nocturnal urination.  Endo: Negative for unusual weight change.    Physical Examination:   BP 153/92 mmHg  Pulse 98  Temp(Src) 97.2 F (36.2 C) (Oral)  Ht _0  (1.6 m)  Wt 117 lb (53.071 kg)  BMI 20.73 kg/m2  General: Well-nourished, well-developed in no acute distress.  Eyes: No icterus. Mouth: Oropharyngeal mucosa moist and pink , no lesions erythema or exudate. Lungs: Clear to auscultation bilaterally.  Heart: Regular rate and rhythm, no murmurs rubs or gallops.  Abdomen: Bowel sounds are normal, nontender, nondistended, no hepatosplenomegaly or masses, no abdominal bruits or hernia , no rebound or guarding.   Extremities: No lower extremity edema. No clubbing or deformities. Neuro: Alert and oriented x 4   Skin: Warm and dry, no jaundice.   Psych: Alert and cooperative, normal mood and affect.  Labs:  Lab Results  Component Value Date   WBC 5.3 07/08/2014   HGB 16.7 07/08/2014   HCT 49.2 07/08/2014   MCV 89.9 07/08/2014   PLT 292 07/08/2014   Lab Results  Component Value Date   CREATININE 1.17 07/08/2014   BUN 7 07/08/2014   NA 140 07/08/2014   K 4.1 07/08/2014   CL 104 07/08/2014   CO2 28 07/08/2014   Lab Results  Component Value Date   ALT 13 07/08/2014   AST 18 07/08/2014   ALKPHOS 108 07/08/2014   BILITOT 0.3 07/08/2014   Lab Results  Component Value Date   LIPASE 50 07/08/2014   Lab Results  Component Value Date   AMYLASE 96 04/14/2011     Imaging Studies: No results found.

## 2014-10-20 NOTE — Assessment & Plan Note (Signed)
59 year old gentleman who seems to be doing well from a GI standpoint. He is here for follow-up for Hemoccult-positive stool that was discovered back in October. CBC was normal at the time. History of EGD and colonoscopy back in March of last year with notable friability of the stomach. Patient takes ibuprofen on occasion but not daily. Maintains PPI therapy.  Clinically he is feeling well. At this point would recommend repeat CBC. If normal hemoglobin he may not need further workup. In this scenario, I will discuss previous heme positive stool with Dr. Gala Romney but this could potentially been related to previous EGD findings. Further recommendations to follow.

## 2014-10-21 NOTE — Progress Notes (Signed)
cc'ed to pcp °

## 2014-10-24 DIAGNOSIS — R195 Other fecal abnormalities: Secondary | ICD-10-CM | POA: Diagnosis not present

## 2014-10-24 LAB — CBC WITH DIFFERENTIAL/PLATELET
Basophils Absolute: 0.1 10*3/uL (ref 0.0–0.1)
Basophils Relative: 1 % (ref 0–1)
Eosinophils Absolute: 0.2 10*3/uL (ref 0.0–0.7)
Eosinophils Relative: 3 % (ref 0–5)
HCT: 47.2 % (ref 39.0–52.0)
Hemoglobin: 15.9 g/dL (ref 13.0–17.0)
Lymphocytes Relative: 38 % (ref 12–46)
Lymphs Abs: 2.3 10*3/uL (ref 0.7–4.0)
MCH: 30.6 pg (ref 26.0–34.0)
MCHC: 33.7 g/dL (ref 30.0–36.0)
MCV: 90.9 fL (ref 78.0–100.0)
MONO ABS: 0.5 10*3/uL (ref 0.1–1.0)
MPV: 10.8 fL (ref 8.6–12.4)
Monocytes Relative: 9 % (ref 3–12)
NEUTROS ABS: 3 10*3/uL (ref 1.7–7.7)
NEUTROS PCT: 49 % (ref 43–77)
PLATELETS: 253 10*3/uL (ref 150–400)
RBC: 5.19 MIL/uL (ref 4.22–5.81)
RDW: 14.7 % (ref 11.5–15.5)
WBC: 6.1 10*3/uL (ref 4.0–10.5)

## 2014-10-28 NOTE — Progress Notes (Signed)
Quick Note:  His CBC is normal.  Since patient is feeling well, I would not recommend any further work up at this time. He should let us know if recurrent abd pain, black stools, bowel issues etc. ______

## 2014-11-24 DIAGNOSIS — F17200 Nicotine dependence, unspecified, uncomplicated: Secondary | ICD-10-CM | POA: Diagnosis not present

## 2014-11-24 DIAGNOSIS — J40 Bronchitis, not specified as acute or chronic: Secondary | ICD-10-CM | POA: Diagnosis not present

## 2014-11-24 DIAGNOSIS — I10 Essential (primary) hypertension: Secondary | ICD-10-CM | POA: Diagnosis not present

## 2014-12-21 ENCOUNTER — Emergency Department (HOSPITAL_COMMUNITY): Payer: Medicare Other

## 2014-12-21 ENCOUNTER — Encounter (HOSPITAL_COMMUNITY): Payer: Self-pay | Admitting: Emergency Medicine

## 2014-12-21 ENCOUNTER — Emergency Department (HOSPITAL_COMMUNITY)
Admission: EM | Admit: 2014-12-21 | Discharge: 2014-12-21 | Disposition: A | Discharge status: Home / Self Care | Payer: Medicare Other | Attending: Emergency Medicine | Admitting: Emergency Medicine

## 2014-12-21 DIAGNOSIS — K219 Gastro-esophageal reflux disease without esophagitis: Secondary | ICD-10-CM | POA: Diagnosis not present

## 2014-12-21 DIAGNOSIS — G8929 Other chronic pain: Secondary | ICD-10-CM | POA: Diagnosis not present

## 2014-12-21 DIAGNOSIS — Z79899 Other long term (current) drug therapy: Secondary | ICD-10-CM | POA: Diagnosis not present

## 2014-12-21 DIAGNOSIS — I10 Essential (primary) hypertension: Secondary | ICD-10-CM | POA: Insufficient documentation

## 2014-12-21 DIAGNOSIS — F17210 Nicotine dependence, cigarettes, uncomplicated: Secondary | ICD-10-CM | POA: Diagnosis not present

## 2014-12-21 DIAGNOSIS — Z86018 Personal history of other benign neoplasm: Secondary | ICD-10-CM | POA: Diagnosis not present

## 2014-12-21 DIAGNOSIS — J441 Chronic obstructive pulmonary disease with (acute) exacerbation: Secondary | ICD-10-CM | POA: Diagnosis not present

## 2014-12-21 DIAGNOSIS — R05 Cough: Secondary | ICD-10-CM | POA: Diagnosis not present

## 2014-12-21 DIAGNOSIS — Z720 Tobacco use: Secondary | ICD-10-CM | POA: Diagnosis not present

## 2014-12-21 DIAGNOSIS — R51 Headache: Secondary | ICD-10-CM | POA: Diagnosis present

## 2014-12-21 LAB — CBC WITH DIFFERENTIAL/PLATELET
BASOS ABS: 0.1 10*3/uL (ref 0.0–0.1)
BASOS PCT: 1 % (ref 0–1)
Eosinophils Absolute: 0.2 10*3/uL (ref 0.0–0.7)
Eosinophils Relative: 2 % (ref 0–5)
HEMATOCRIT: 48.7 % (ref 39.0–52.0)
Hemoglobin: 17 g/dL (ref 13.0–17.0)
LYMPHS ABS: 2.6 10*3/uL (ref 0.7–4.0)
LYMPHS PCT: 27 % (ref 12–46)
MCH: 31.7 pg (ref 26.0–34.0)
MCHC: 34.9 g/dL (ref 30.0–36.0)
MCV: 90.7 fL (ref 78.0–100.0)
Monocytes Absolute: 0.8 10*3/uL (ref 0.1–1.0)
Monocytes Relative: 8 % (ref 3–12)
NEUTROS PCT: 62 % (ref 43–77)
Neutro Abs: 6 10*3/uL (ref 1.7–7.7)
Platelets: 234 10*3/uL (ref 150–400)
RBC: 5.37 MIL/uL (ref 4.22–5.81)
RDW: 14.6 % (ref 11.5–15.5)
WBC: 9.6 10*3/uL (ref 4.0–10.5)

## 2014-12-21 LAB — BASIC METABOLIC PANEL
ANION GAP: 9 (ref 5–15)
BUN: 6 mg/dL (ref 6–23)
CALCIUM: 9.5 mg/dL (ref 8.4–10.5)
CO2: 29 mmol/L (ref 19–32)
Chloride: 106 mmol/L (ref 96–112)
Creatinine, Ser: 1.06 mg/dL (ref 0.50–1.35)
GFR calc non Af Amer: 76 mL/min — ABNORMAL LOW (ref >90)
GFR, EST AFRICAN AMERICAN: 88 mL/min — AB (ref >90)
Glucose, Bld: 93 mg/dL (ref 70–99)
Potassium: 4.1 mmol/L (ref 3.5–5.1)
Sodium: 144 mmol/L (ref 135–145)

## 2014-12-21 LAB — TROPONIN I: Troponin I: <0.03 ng/mL (ref <0.031)

## 2014-12-21 MED ORDER — ALBUTEROL SULFATE HFA 108 (90 BASE) MCG/ACT IN AERS: 1.0000 | INHALATION_SPRAY | Freq: Four times a day (QID) | RESPIRATORY_TRACT | Status: DC | PRN

## 2014-12-21 MED ORDER — ACETAMINOPHEN 500 MG PO TABS
1000.0000 mg | ORAL_TABLET | Freq: Once | ORAL | Status: AC
  Administered 2014-12-21: 1000 mg via ORAL
  Filled 2014-12-21: qty 2

## 2014-12-21 MED ORDER — ALBUTEROL SULFATE HFA 108 (90 BASE) MCG/ACT IN AERS
2.0000 | INHALATION_SPRAY | Freq: Once | RESPIRATORY_TRACT | Status: AC
  Administered 2014-12-21: 2 via RESPIRATORY_TRACT
  Filled 2014-12-21: qty 6.7

## 2014-12-21 MED ORDER — IPRATROPIUM-ALBUTEROL 0.5-2.5 (3) MG/3ML IN SOLN
3.0000 mL | Freq: Once | RESPIRATORY_TRACT | Status: AC
  Administered 2014-12-21: 3 mL via RESPIRATORY_TRACT
  Filled 2014-12-21: qty 3

## 2014-12-21 MED ORDER — DEXAMETHASONE SODIUM PHOSPHATE 10 MG/ML IJ SOLN
10.0000 mg | Freq: Once | INTRAMUSCULAR | Status: AC
  Administered 2014-12-21: 10 mg via INTRAVENOUS
  Filled 2014-12-21: qty 1

## 2014-12-21 NOTE — ED Notes (Signed)
RT at bedside.

## 2014-12-21 NOTE — ED Notes (Signed)
RT called

## 2014-12-21 NOTE — ED Provider Notes (Signed)
CSN: [id number]     Arrival date & time 12/21/14  1157 History   First MD Initiated Contact with Patient 12/21/14 1533     Chief Complaint  Patient presents with  . Headache  . Asthma     (Consider location/radiation/quality/duration/timing/severity/associated sxs/prior Treatment) HPI  58 year old male presents for evaluation of 2 weeks of intermittent chest tightness and shortness of breath. Has been having a nonproductive cough as well. Symptoms seem similar to prior COPD/asthma issues. Has taken his inhaler every day with no significant relief. Patient denies any exertional chest pain. States after the tightness he sometimes gets a sharp chest pain in the left side of his chest. Patient also complains of a bitemporal headache. Patient states this is been coming and going for at least the past 2 weeks. Patient denies any nausea or vomiting. Denies any focal weakness. She has taken ibuprofen with some temporary relief than the headache seems to come right back. Patient is on lisinopril 20 mg, has been on this increased dose for the last couple months. Prior to that he is on 10 mg. States he is taking it as instructed.  Past Medical History  Diagnosis Date  . Chronic pain     with leg and back pain (disc problem)  . History of upper GI x-ray series     to follow showed large duodenal ulcer H pylori serologies were negative  . Acid reflux   . Hypertension   . Asthma   . COPD (chronic obstructive pulmonary disease)   . Tubular adenoma    Past Surgical History  Procedure Laterality Date  . Esophagogastroduodenoscopy  1/06    Dr. Volney American esophageal erosions,U-shaped stomach,marked erosions and edema of the bulb without discrete ulcer disease.   . Back surgery  7/05; 5/09     Dr.Hirsch,3 lumbar  . Neck surgery  2/09  . Back surgery    . Colonoscopy  Feb 2012    Dr. Gala Romney: normal rectum, pedunculate polyp removed but not recovered  . Colonoscopy with esophagogastroduodenoscopy  (egd) N/A 11/18/2013    Dr.Rourk- tcs= normal rectum, multipe polyps about the ileocecal valve and distal transverse colon o/w the remainder of the colonic mucosa appeared normal bx= tubular adenoma. EGD= normal esophagus, stomach with scattered erosions mottling, friablility, no ulcer or infiltrating process patent pylorus bx= chronic inflammation. next TCS 11/2016   Family History  Problem Relation Age of Onset  . Cancer Father   . Asthma Mother   . Colon cancer Neg Hx    History  Substance Use Topics  . Smoking status: Current Every Day Smoker -- 0.50 packs/day for 40 years    Types: Cigarettes  . Smokeless tobacco: Never Used  . Alcohol Use: No    Review of Systems  Constitutional: Negative for fever.  Respiratory: Positive for cough, chest tightness and shortness of breath.   Cardiovascular: Positive for chest pain.  Gastrointestinal: Negative for vomiting.  Neurological: Positive for headaches. Negative for weakness.  All other systems reviewed and are negative.     Allergies  Review of patient's allergies indicates no known allergies.  Home Medications   Prior to Admission medications   Medication Sig Start Date End Date Taking? Authorizing Provider  esomeprazole (NEXIUM) 40 MG capsule Take 1 capsule (40 mg total) by mouth 2 (two) times daily before a meal. 06/19/14   Mahala Menghini, PA-C  lisinopril (PRINIVIL,ZESTRIL) 10 MG tablet Take 10 mg by mouth daily.  06/18/14   Historical Provider, MD  Abe People  HFA 108 (90 BASE) MCG/ACT inhaler Inhale 2 puffs into the lungs every 6 (six) hours as needed for wheezing or shortness of breath.  10/07/13   Historical Provider, MD   BP 210/106 mmHg  Pulse 90  Temp(Src) 98.1 F (36.7 C) (Oral)  Resp 24  Ht _0  (1.6 m)  Wt 118 lb (53.524 kg)  BMI 20.91 kg/m2  SpO2 98% Physical Exam  Constitutional: He is oriented to person, place, and time. He appears well-developed and well-nourished.  HENT:  Head: Normocephalic and atraumatic.   Right Ear: External ear normal.  Left Ear: External ear normal.  Nose: Nose normal.  Eyes: EOM are normal. Pupils are equal, round, and reactive to light. Right eye exhibits no discharge. Left eye exhibits no discharge.  Neck: Neck supple.  Cardiovascular: Normal rate, regular rhythm, normal heart sounds and intact distal pulses.   Pulmonary/Chest: Effort normal. He has wheezes (mild diffuse expiratory wheezes). He exhibits no tenderness.  Abdominal: Soft. There is no tenderness.  Musculoskeletal: He exhibits no edema.  Neurological: He is alert and oriented to person, place, and time.  CN 2-12 grossly intact. 5/5 strength in all 4 extremities. Normal finger to nose  Skin: Skin is warm and dry.  Nursing note and vitals reviewed.   ED Course  Procedures (including critical care time) Labs Review Labs Reviewed  BASIC METABOLIC PANEL - Abnormal; Notable for the following:    GFR calc non Af Amer 76 (*)    GFR calc Af Amer 88 (*)    All other components within normal limits  TROPONIN I  CBC WITH DIFFERENTIAL/PLATELET    Imaging Review Dg Chest 2 View  12/21/2014   CLINICAL DATA:  Nonproductive cough. Cigarette smoker. Short of breath.  EXAM: CHEST  2 VIEW  COMPARISON:  None.  FINDINGS: Emphysema is present. Hyperinflation with flattening of the hemidiaphragms. Cardiopericardial silhouette appears within normal limits. Aortic contours are normal. Lower cervical ACDF. Enlargement of the pulmonary arteries is present bilaterally compatible with pulmonary arterial hypertension, likely secondary to emphysema. There is no pneumothorax.  IMPRESSION: Emphysema without acute cardiopulmonary disease.   Electronically Signed   By: Dereck Ligas M.D.   On: 12/21/2014 13:32     EKG Interpretation   Date/Time:  Sunday December 21 2014 12:08:38 EDT Ventricular Rate:  75 PR Interval:  122 QRS Duration: 68 QT Interval:  360 QTC Calculation: 402 R Axis:   79 Text Interpretation:  Normal sinus  rhythm with sinus arrhythmia Normal ECG  no significant change since Aug 2012 Confirmed by Regenia Skeeter  MD, Mehlani Blankenburg  ([id number]) on 12/21/2014 3:34:30 PM      MDM   Final diagnoses:  Essential hypertension  COPD exacerbation    Patient's dyspnea is significant improved with 1 DuoNeb. Given Decadron to help with bronchospasm. No evidence of acute bacterial infection. Hypertension has slowly trended down here. Will recommend increasing his lisinopril and advising close follow-up with PCP. No evidence of ACS. I believe his chest tightness is related to his COPD. Given an albuterol inhaler here as he thinks his is empty. This point he is well appearing and stable for discharge home.    Sherwood Gambler, MD 12/21/14 [phone]

## 2014-12-21 NOTE — ED Notes (Signed)
Patient c/o wheezing/shortness of breath "for a while." Per patient has asthma, reports using inhaler this morning with no relief. Per patient cough in which patient states he feels the sputum getting stuck in his chest. Patient also c/o headache x3-4 days ago. Denies any vomiting, dizziness, or neurological deficits noted.

## 2014-12-21 NOTE — ED Notes (Signed)
After patient triaged patient c/o tightness in chest. Patient states "It feels like I can't catch my breath." Per patient tightness in chest x2 weeks that has progressively gotten worse.

## 2014-12-30 DIAGNOSIS — F17200 Nicotine dependence, unspecified, uncomplicated: Secondary | ICD-10-CM | POA: Diagnosis not present

## 2014-12-30 DIAGNOSIS — J40 Bronchitis, not specified as acute or chronic: Secondary | ICD-10-CM | POA: Diagnosis not present

## 2015-01-27 ENCOUNTER — Other Ambulatory Visit: Payer: Self-pay | Admitting: Gastroenterology

## 2015-02-23 DIAGNOSIS — J449 Chronic obstructive pulmonary disease, unspecified: Secondary | ICD-10-CM | POA: Diagnosis not present

## 2015-02-23 DIAGNOSIS — M438X9 Other specified deforming dorsopathies, site unspecified: Secondary | ICD-10-CM | POA: Diagnosis not present

## 2015-03-13 ENCOUNTER — Ambulatory Visit: Payer: Medicare Other | Admitting: Urology

## 2015-05-26 DIAGNOSIS — I10 Essential (primary) hypertension: Secondary | ICD-10-CM | POA: Diagnosis not present

## 2015-05-26 DIAGNOSIS — K219 Gastro-esophageal reflux disease without esophagitis: Secondary | ICD-10-CM | POA: Diagnosis not present

## 2015-05-26 DIAGNOSIS — J449 Chronic obstructive pulmonary disease, unspecified: Secondary | ICD-10-CM | POA: Diagnosis not present

## 2015-05-26 DIAGNOSIS — N400 Enlarged prostate without lower urinary tract symptoms: Secondary | ICD-10-CM | POA: Diagnosis not present

## 2015-05-26 DIAGNOSIS — C61 Malignant neoplasm of prostate: Secondary | ICD-10-CM | POA: Diagnosis not present

## 2015-07-08 ENCOUNTER — Emergency Department (HOSPITAL_COMMUNITY): Payer: Medicare Other

## 2015-07-08 ENCOUNTER — Emergency Department (HOSPITAL_COMMUNITY)
Admission: EM | Admit: 2015-07-08 | Discharge: 2015-07-08 | Disposition: A | Discharge status: Home / Self Care | Payer: Medicare Other | Attending: Emergency Medicine | Admitting: Emergency Medicine

## 2015-07-08 ENCOUNTER — Encounter (HOSPITAL_COMMUNITY): Payer: Self-pay | Admitting: Emergency Medicine

## 2015-07-08 DIAGNOSIS — R05 Cough: Secondary | ICD-10-CM | POA: Diagnosis not present

## 2015-07-08 DIAGNOSIS — Z79899 Other long term (current) drug therapy: Secondary | ICD-10-CM | POA: Diagnosis not present

## 2015-07-08 DIAGNOSIS — K219 Gastro-esophageal reflux disease without esophagitis: Secondary | ICD-10-CM | POA: Insufficient documentation

## 2015-07-08 DIAGNOSIS — Z862 Personal history of diseases of the blood and blood-forming organs and certain disorders involving the immune mechanism: Secondary | ICD-10-CM | POA: Diagnosis not present

## 2015-07-08 DIAGNOSIS — J449 Chronic obstructive pulmonary disease, unspecified: Secondary | ICD-10-CM | POA: Insufficient documentation

## 2015-07-08 DIAGNOSIS — G8929 Other chronic pain: Secondary | ICD-10-CM | POA: Insufficient documentation

## 2015-07-08 DIAGNOSIS — I10 Essential (primary) hypertension: Secondary | ICD-10-CM | POA: Diagnosis not present

## 2015-07-08 DIAGNOSIS — N419 Inflammatory disease of prostate, unspecified: Secondary | ICD-10-CM | POA: Diagnosis not present

## 2015-07-08 DIAGNOSIS — J40 Bronchitis, not specified as acute or chronic: Secondary | ICD-10-CM

## 2015-07-08 DIAGNOSIS — F17210 Nicotine dependence, cigarettes, uncomplicated: Secondary | ICD-10-CM | POA: Insufficient documentation

## 2015-07-08 DIAGNOSIS — R300 Dysuria: Secondary | ICD-10-CM | POA: Diagnosis present

## 2015-07-08 LAB — BASIC METABOLIC PANEL
Anion gap: 7 (ref 5–15)
BUN: 6 mg/dL (ref 6–20)
CHLORIDE: 105 mmol/L (ref 101–111)
CO2: 28 mmol/L (ref 22–32)
Calcium: 9.2 mg/dL (ref 8.9–10.3)
Creatinine, Ser: 1.2 mg/dL (ref 0.61–1.24)
GFR calc Af Amer: >60 mL/min (ref >60)
GFR calc non Af Amer: >60 mL/min (ref >60)
Glucose, Bld: 119 mg/dL — ABNORMAL HIGH (ref 65–99)
POTASSIUM: 4 mmol/L (ref 3.5–5.1)
Sodium: 140 mmol/L (ref 135–145)

## 2015-07-08 LAB — URINALYSIS, ROUTINE W REFLEX MICROSCOPIC
Bilirubin Urine: NEGATIVE
Glucose, UA: NEGATIVE mg/dL
Ketones, ur: NEGATIVE mg/dL
Leukocytes, UA: NEGATIVE
Nitrite: NEGATIVE
Protein, ur: 30 mg/dL — AB
Specific Gravity, Urine: 1.025 (ref 1.005–1.030)
Urobilinogen, UA: 0.2 mg/dL (ref 0.0–1.0)
pH: 6 (ref 5.0–8.0)

## 2015-07-08 LAB — URINE MICROSCOPIC-ADD ON

## 2015-07-08 MED ORDER — LEVOFLOXACIN 500 MG PO TABS: 500.0000 mg | ORAL_TABLET | Freq: Every day | ORAL | Status: DC

## 2015-07-08 MED ORDER — ALBUTEROL SULFATE HFA 108 (90 BASE) MCG/ACT IN AERS: 1.0000 | INHALATION_SPRAY | RESPIRATORY_TRACT | Status: DC | PRN

## 2015-07-08 NOTE — ED Provider Notes (Signed)
CSN: [id number]     Arrival date & time 07/08/15  1305 History   First MD Initiated Contact with Patient 07/08/15 1318     Chief Complaint  Patient presents with  . Urinary Frequency     (Consider location/radiation/quality/duration/timing/severity/associated sxs/prior Treatment) Patient is a 59 y.o. male presenting with dysuria. The history is provided by the patient. No language interpreter was used.  Dysuria This is a recurrent problem. The current episode started more than 1 month ago. The problem occurs intermittently. The problem has been rapidly worsening. Pertinent negatives include no abdominal pain. Nothing aggravates the symptoms. He has tried nothing for the symptoms. The treatment provided no relief.  Pt saw Dr. Legrand Rams last month and had a normal PSA. Pt was told his prostate is enlarged. Pt also complains of cough and congestion.  Pt reports he stopped smoking in March.   Past Medical History  Diagnosis Date  . Chronic pain     with leg and back pain (disc problem)  . History of upper GI x-ray series     to follow showed large duodenal ulcer H pylori serologies were negative  . Acid reflux   . Hypertension   . Asthma   . COPD (chronic obstructive pulmonary disease) ([place])   . Tubular adenoma    Past Surgical History  Procedure Laterality Date  . Esophagogastroduodenoscopy  1/06    Dr. Volney American esophageal erosions,U-shaped stomach,marked erosions and edema of the bulb without discrete ulcer disease.   . Back surgery  7/05; 5/09     Dr.Hirsch,3 lumbar  . Neck surgery  2/09  . Back surgery    . Colonoscopy  Feb 2012    Dr. Gala Romney: normal rectum, pedunculate polyp removed but not recovered  . Colonoscopy with esophagogastroduodenoscopy (egd) N/A 11/18/2013    Dr.Rourk- tcs= normal rectum, multipe polyps about the ileocecal valve and distal transverse colon o/w the remainder of the colonic mucosa appeared normal bx= tubular adenoma. EGD= normal esophagus, stomach with  scattered erosions mottling, friablility, no ulcer or infiltrating process patent pylorus bx= chronic inflammation. next TCS 11/2016   Family History  Problem Relation Age of Onset  . Cancer Father   . Asthma Mother   . Colon cancer Neg Hx    Social History  Substance Use Topics  . Smoking status: Current Every Day Smoker -- 0.50 packs/day for 40 years    Types: Cigarettes  . Smokeless tobacco: Never Used  . Alcohol Use: No    Review of Systems  Gastrointestinal: Negative for abdominal pain.  Genitourinary: Positive for dysuria.  All other systems reviewed and are negative.     Allergies  Review of patient's allergies indicates no known allergies.  Home Medications   Prior to Admission medications   Medication Sig Start Date End Date Taking? Authorizing Provider  albuterol (PROVENTIL HFA;VENTOLIN HFA) 108 (90 BASE) MCG/ACT inhaler Inhale 1-2 puffs into the lungs every 6 (six) hours as needed for wheezing or shortness of breath. 12/21/14   Sherwood Gambler, MD  ibuprofen (ADVIL,MOTRIN) 800 MG tablet Take 800 mg by mouth 3 (three) times daily as needed. 11/24/14   Historical Provider, MD  lisinopril (PRINIVIL,ZESTRIL) 20 MG tablet Take 20 mg by mouth daily.    Historical Provider, MD  NEXIUM 40 MG capsule TAKE 1 CAPSULE BY MOUTH TWICE DAILY BEFORE A MEAL 01/27/15   Carlis Stable, NP  SYMBICORT 160-4.5 MCG/ACT inhaler Inhale 1 puff into the lungs 2 (two) times daily. 11/24/14   Historical Provider,  MD   BP 157/89 mmHg  Pulse 79  Temp(Src) 98.1 F (36.7 C) (Oral)  Resp 18  Ht _0  (1.6 m)  Wt 126 lb (57.153 kg)  BMI 22.33 kg/m2  SpO2 99% Physical Exam  Constitutional: He appears well-developed and well-nourished.  HENT:  Head: Normocephalic.  Right Ear: External ear normal.  Left Ear: External ear normal.  Eyes: Conjunctivae are normal. Pupils are equal, round, and reactive to light.  Neck: Normal range of motion.  Cardiovascular: Normal rate.   Pulmonary/Chest: Effort  normal.  Harsh breath sounds  Abdominal: Soft.  Musculoskeletal: Normal range of motion.  Skin: Skin is warm.  Psychiatric: He has a normal mood and affect.  Nursing note and vitals reviewed.   ED Course  Procedures (including critical care time) Labs Review Labs Reviewed  URINALYSIS, ROUTINE W REFLEX MICROSCOPIC (NOT AT [facility]) - Abnormal; Notable for the following:    Hgb urine dipstick TRACE (*)    Protein, ur 30 (*)    All other components within normal limits  URINE MICROSCOPIC-ADD ON    Imaging Review Dg Chest 2 View  07/08/2015  CLINICAL DATA:  Persistent cough for 1 month. EXAM: CHEST  2 VIEW COMPARISON:  14 2016 FINDINGS: Lower cervical fusion hardware noted. Stable heart size and vascularity. Stable mild hyperinflation with chronic bronchitic change and lingula scarring, best appreciated on the lateral view. No focal pneumonia, collapse or consolidation. Negative for edema, effusion or pneumothorax. Trachea is midline. Normal aortic contour. No acute osseous finding. Normal bowel gas pattern. IMPRESSION: Stable hyperinflation and chronic bronchitic change, suspect COPD/ emphysema Chronic lingula scarring No acute process or significant interval change. Electronically Signed   By: Jerilynn Mages.  Shick M.D.   On: 07/08/2015 14:22   I have personally reviewed and evaluated these images and lab results as part of my medical decision-making.   EKG Interpretation None      MDM   Final diagnoses:  Bronchitis  Prostatitis, unspecified prostatitis type    Pt given levaquin rx rx for albuterol Pt advised to see the urologist for evaluation.   Pt advised to see Dr. Legrand Rams for recheck in 1 week.     Hollace Kinnier Benton, PA-C 07/08/15 [place], MD 07/09/15 [phone]

## 2015-07-08 NOTE — ED Notes (Signed)
Pt states that he has been urinating about every 15 minutes for the past few days and it burns.

## 2015-07-08 NOTE — Discharge Instructions (Signed)
Prostatitis The prostate gland is about the size and shape of a walnut. It is located just below your bladder. It produces one of the components of semen, which is made up of sperm and the fluids that help nourish and transport it out from the testicles. Prostatitis is inflammation of the prostate gland.  There are four types of prostatitis:  Acute bacterial prostatitis. This is the least common type of prostatitis. It starts quickly and usually is associated with a bladder infection, high fever, and shaking chills. It can occur at any age.  Chronic bacterial prostatitis. This is a persistent bacterial infection in the prostate. It usually develops from repeated acute bacterial prostatitis or acute bacterial prostatitis that was not properly treated. It can occur in men of any age but is most common in middle-aged men whose prostate has begun to enlarge. The symptoms are not as severe as those in acute bacterial prostatitis. Discomfort in the part of your body that is in front of your rectum and below your scrotum (perineum), lower abdomen, or in the head of your penis (glans) may represent your primary discomfort.  Chronic prostatitis (nonbacterial). This is the most common type of prostatitis. It is inflammation of the prostate gland that is not caused by a bacterial infection. The cause is unknown and may be associated with a viral infection or autoimmune disorder.  Prostatodynia (pelvic floor disorder). This is associated with increased muscular tone in the pelvis surrounding the prostate. CAUSES The causes of bacterial prostatitis are bacterial infection. The causes of the other types of prostatitis are unknown.  SYMPTOMS  Symptoms can vary depending upon the type of prostatitis that exists. There can also be overlap in symptoms. Possible symptoms for each type of prostatitis are listed below. Acute Bacterial Prostatitis  Painful urination.  Fever or chills.  Muscle or joint pains.  Low  back pain.  Low abdominal pain.  Inability to empty bladder completely. Chronic Bacterial Prostatitis, Chronic Nonbacterial Prostatitis, and Prostatodynia  Sudden urge to urinate.  Frequent urination.  Difficulty starting urine stream.  Weak urine stream.  Discharge from the urethra.  Dribbling after urination.  Rectal pain.  Pain in the testicles, penis, or tip of the penis.  Pain in the perineum.  Problems with sexual function.  Painful ejaculation.  Bloody semen. DIAGNOSIS  In order to diagnose prostatitis, your health care provider will ask about your symptoms. One or more urine samples will be taken and tested (urinalysis). If the urinalysis result is negative for bacteria, your health care provider may use a finger to feel your prostate (digital rectal exam). This exam helps your health care provider determine if your prostate is swollen and tender. It will also produce a specimen of semen that can be analyzed. TREATMENT  Treatment for prostatitis depends on the cause. If a bacterial infection is the cause, it can be treated with antibiotic medicine. In cases of chronic bacterial prostatitis, the use of antibiotics for up to 1 month or 6 weeks may be necessary. Your health care provider may instruct you to take sitz baths to help relieve pain. A sitz bath is a bath of hot water in which your hips and buttocks are under water. This relaxes the pelvic floor muscles and often helps to relieve the pressure on your prostate. HOME CARE INSTRUCTIONS   Take all medicines as directed by your health care provider.  Take sitz baths as directed by your health care provider. SEEK MEDICAL CARE IF:   Your symptoms  get worse, not better.  You have a fever. SEEK IMMEDIATE MEDICAL CARE IF:   You have chills.  You feel nauseous or vomit.  You feel lightheaded or faint.  You are unable to urinate.  You have blood or blood clots in your urine. MAKE SURE YOU:  Understand  these instructions.  Will watch your condition.  Will get help right away if you are not doing well or get worse.   This information is not intended to replace advice given to you by your health care provider. Make sure you discuss any questions you have with your health care provider.   Document Released: 08/26/2000 Document Revised: 09/19/2014 Document Reviewed: 03/18/2013 Elsevier Interactive Patient Education 2016 [employer] Acute Bronchitis Bronchitis is inflammation of the airways that extend from the windpipe into the lungs (bronchi). The inflammation often causes mucus to develop. This leads to a cough, which is the most common symptom of bronchitis.  In acute bronchitis, the condition usually develops suddenly and goes away over time, usually in a couple weeks. Smoking, allergies, and asthma can make bronchitis worse. Repeated episodes of bronchitis may cause further lung problems.  CAUSES Acute bronchitis is most often caused by the same virus that causes a cold. The virus can spread from person to person (contagious) through coughing, sneezing, and touching contaminated objects. SIGNS AND SYMPTOMS   Cough.   Fever.   Coughing up mucus.   Body aches.   Chest congestion.   Chills.   Shortness of breath.   Sore throat.  DIAGNOSIS  Acute bronchitis is usually diagnosed through a physical exam. Your health care provider will also ask you questions about your medical history. Tests, such as chest X-rays, are sometimes done to rule out other conditions.  TREATMENT  Acute bronchitis usually goes away in a couple weeks. Oftentimes, no medical treatment is necessary. Medicines are sometimes given for relief of fever or cough. Antibiotic medicines are usually not needed but may be prescribed in certain situations. In some cases, an inhaler may be recommended to help reduce shortness of breath and control the cough. A cool mist vaporizer may also be used to help thin  bronchial secretions and make it easier to clear the chest.  HOME CARE INSTRUCTIONS  Get plenty of rest.   Drink enough fluids to keep your urine clear or pale yellow (unless you have a medical condition that requires fluid restriction). Increasing fluids may help thin your respiratory secretions (sputum) and reduce chest congestion, and it will prevent dehydration.   Take medicines only as directed by your health care provider.  If you were prescribed an antibiotic medicine, finish it all even if you start to feel better.  Avoid smoking and secondhand smoke. Exposure to cigarette smoke or irritating chemicals will make bronchitis worse. If you are a smoker, consider using nicotine gum or skin patches to help control withdrawal symptoms. Quitting smoking will help your lungs heal faster.   Reduce the chances of another bout of acute bronchitis by washing your hands frequently, avoiding people with cold symptoms, and trying not to touch your hands to your mouth, nose, or eyes.   Keep all follow-up visits as directed by your health care provider.  SEEK MEDICAL CARE IF: Your symptoms do not improve after 1 week of treatment.  SEEK IMMEDIATE MEDICAL CARE IF:  You develop an increased fever or chills.   You have chest pain.   You have severe shortness of breath.  You have bloody sputum.  You develop dehydration.  You faint or repeatedly feel like you are going to pass out.  You develop repeated vomiting.  You develop a severe headache. MAKE SURE YOU:   Understand these instructions.  Will watch your condition.  Will get help right away if you are not doing well or get worse.   This information is not intended to replace advice given to you by your health care provider. Make sure you discuss any questions you have with your health care provider.   Document Released: 10/06/2004 Document Revised: 09/19/2014 Document Reviewed: 02/19/2013 Elsevier Interactive Patient  Education [employer].

## 2015-07-08 NOTE — ED Notes (Signed)
Discharge instructions reviewed with pt. States understanding. E signed wrong chart

## 2015-08-25 DIAGNOSIS — K0500 Acute gingivitis, plaque induced: Secondary | ICD-10-CM | POA: Diagnosis not present

## 2015-08-25 DIAGNOSIS — J449 Chronic obstructive pulmonary disease, unspecified: Secondary | ICD-10-CM | POA: Diagnosis not present

## 2015-08-25 DIAGNOSIS — M545 Low back pain: Secondary | ICD-10-CM | POA: Diagnosis not present

## 2015-08-25 DIAGNOSIS — N400 Enlarged prostate without lower urinary tract symptoms: Secondary | ICD-10-CM | POA: Diagnosis not present

## 2015-09-10 ENCOUNTER — Telehealth: Payer: Self-pay | Admitting: Pulmonary Disease

## 2015-09-10 NOTE — Telephone Encounter (Signed)
Called and spoke with patient. Pt states he needs ov so that he can receive his driver's licenses. Pt states he is trying to have a spirometry done so that he can get a interlock device placed in his car. I scheduled him for an ov with TP on 09/24/14 at 4pm. I explained to him that he needs to bring all forms to ov. Pt voiced understanding and had no further questions. Nothing further needed.

## 2015-09-23 DIAGNOSIS — I10 Essential (primary) hypertension: Secondary | ICD-10-CM | POA: Diagnosis not present

## 2015-09-23 DIAGNOSIS — J449 Chronic obstructive pulmonary disease, unspecified: Secondary | ICD-10-CM | POA: Diagnosis not present

## 2015-09-23 DIAGNOSIS — N401 Enlarged prostate with lower urinary tract symptoms: Secondary | ICD-10-CM | POA: Diagnosis not present

## 2015-09-25 ENCOUNTER — Ambulatory Visit (INDEPENDENT_AMBULATORY_CARE_PROVIDER_SITE_OTHER): Payer: Medicare Other | Admitting: Adult Health

## 2015-09-25 ENCOUNTER — Encounter: Payer: Self-pay | Admitting: Adult Health

## 2015-09-25 VITALS — BP 130/80 | HR 73 | Temp 98.3°F | Ht 63.0 in | Wt 126.0 lb

## 2015-09-25 DIAGNOSIS — J449 Chronic obstructive pulmonary disease, unspecified: Secondary | ICD-10-CM | POA: Diagnosis not present

## 2015-09-25 MED ORDER — SYMBICORT 160-4.5 MCG/ACT IN AERO: 1.0000 | INHALATION_SPRAY | Freq: Two times a day (BID) | RESPIRATORY_TRACT | Status: DC

## 2015-09-25 NOTE — Patient Instructions (Signed)
Continue on Symbicort 2 puffs Twice daily  . , rinse after use .  Great job on not smoking  Follow up Dr. Elsworth Soho  In 6 months and As needed

## 2015-09-25 NOTE — Assessment & Plan Note (Signed)
Very severe COPD with Fev1 21% DMV papers completed  Plan  Continue on Symbicort 2 puffs Twice daily  . , rinse after use .  Great job on not smoking  Follow up Dr. Elsworth Soho  In 6 months and As needed

## 2015-09-25 NOTE — Progress Notes (Signed)
   Subjective:    Patient ID: Bryan [name], male    DOB: 1955/10/25, 60 y.o.   MRN: [id number]  HPI  PCP - [place]   60 year old heavy smoker referred for FU of COPD.  He had a DWI in 7494 and lost his license, he had not driven in over 20 years and renewed his drivers license in 4967. He was not able to blow into the ignition interlock and was referred to 2 pulmonologists to fill out the waiver form. Marland Kitchen He was seen by Dr. Luan Pulling in [place] who certified that he has severe COPD. Spirometry showed an FEV1 of 0.58 - 21% with FEV1 of 29% suggestive of severe airway obstruction.  He smokes a pack per day since his teenage years and recently has cut down to half pack. He reports dyspnea on walking a few yards and cannot climb stairs. He is disabled since 2009 due to a bad back. He uses albuterol inhaler about twice daily and reports intermittent wheezing, denies frequent chest colds. He denies paroxysmal nocturnal dyspnea, orthopnea or pedal edema he does report some acid heartburn on occasion.  CXR showed Chronic changes of COPD & dilatation of the central pulmonary arteries which may suggest  pulmonary arterial hypertension  Spirometry showed Fev1 of 0.97 (37%) , as low as 0.71 (27%) with FVC 1.85 (58%)   09/25/2015 Follow up COPD  Pt returns for follow up . Last seen 10/2013.  Needs form filled out for interlock for DMV . Papers completed.  Remains on Symbicort. -does not take regular basis . Refill sent to pharm .  Has quit smoking. Congratulated on success.  Spirometry today shows severe COPD FEV1 21%, ratio 45.  Declines flu shot.  CXR 06/2015 shows COPD changes.  He denies any chest pain, orthopnea, PND, or increased leg swelling  Past Medical History  Diagnosis Date  . Chronic pain     with leg and back pain (disc problem)  . History of upper GI x-ray series     to follow showed large duodenal ulcer H pylori serologies were negative  . Acid reflux   . Hypertension   . Asthma   .  COPD (chronic obstructive pulmonary disease) ([place])   . Tubular adenoma      Review of Systems neg for any significant sore throat, dysphagia, itching, sneezing, nasal congestion or excess/ purulent secretions, fever, chills, sweats, unintended wt loss, pleuritic or exertional cp, hempoptysis, orthopnea pnd or change in chronic leg swelling. Also denies presyncope, palpitations, heartburn, abdominal pain, nausea, vomiting, diarrhea or change in bowel or urinary habits, dysuria,hematuria, rash, arthralgias, visual complaints, headache, numbness weakness or ataxia.     Objective:   Physical Exam  Gen. Pleasant,  in no distress, normal affect ENT - no lesions, no post nasal drip Neck: No JVD, no thyromegaly, no carotid bruits Lungs: no use of accessory muscles, no dullness to percussion, decreased BL  without rales or rhonchi  Cardiovascular: Rhythm regular, heart sounds  normal, no murmurs or gallops, no peripheral edema Abdomen: soft and non-tender, no hepatosplenomegaly, BS normal. Musculoskeletal: No deformities, no cyanosis or clubbing Neuro:  alert, non focal       Assessment & Plan:

## 2015-09-28 ENCOUNTER — Telehealth: Payer: Self-pay | Admitting: Adult Health

## 2015-09-28 NOTE — Telephone Encounter (Signed)
lmtcb x1 for pt. 

## 2015-09-28 NOTE — Progress Notes (Signed)
Reviewed & agree with plan  

## 2015-09-29 NOTE — Telephone Encounter (Signed)
Per Estill Bamberg- We made copies of paperwork to scan into chart, but all forms were given back to him to take to the [facility].  Spoke with pt to make aware of this.  Pt expressed understanding.  Nothing further needed.

## 2015-09-29 NOTE — Telephone Encounter (Signed)
Spoke with pt, wants to know if his paperwork from Friday was faxed to the [facility] for his interlock system. Estill Bamberg please advise.  Thanks!

## 2015-10-05 ENCOUNTER — Encounter: Payer: Self-pay | Admitting: Internal Medicine

## 2015-10-15 DIAGNOSIS — J449 Chronic obstructive pulmonary disease, unspecified: Secondary | ICD-10-CM | POA: Diagnosis not present

## 2015-11-05 ENCOUNTER — Telehealth: Payer: Self-pay | Admitting: Adult Health

## 2015-11-05 NOTE — Telephone Encounter (Signed)
Spoke with the pt  He states that the [facility] has the forms for Ignition Interlock  They are just needing the actual spiro results  I have faxed this to the [facility] at [phone] Nothing further needed per pt

## 2015-11-17 ENCOUNTER — Telehealth: Payer: Self-pay | Admitting: Adult Health

## 2015-11-17 NOTE — Telephone Encounter (Signed)
Called and spoke with pt. Pt states that his ignition interlock waiver form was completed at his ov on 09/25/15 but he called [place] today and they states they did not receive it. I explained to him that I would refax the forms. He voiced understanding and had no further questions. Form has been faxed to [phone]. Nothing further needed.

## 2015-11-30 DIAGNOSIS — R3915 Urgency of urination: Secondary | ICD-10-CM | POA: Diagnosis not present

## 2015-11-30 DIAGNOSIS — N3941 Urge incontinence: Secondary | ICD-10-CM | POA: Diagnosis not present

## 2015-11-30 DIAGNOSIS — N4281 Prostatodynia syndrome: Secondary | ICD-10-CM | POA: Diagnosis not present

## 2015-11-30 DIAGNOSIS — R351 Nocturia: Secondary | ICD-10-CM | POA: Diagnosis not present

## 2015-12-01 ENCOUNTER — Encounter (HOSPITAL_COMMUNITY): Payer: Self-pay | Admitting: *Deleted

## 2015-12-01 ENCOUNTER — Emergency Department (HOSPITAL_COMMUNITY)
Admission: EM | Admit: 2015-12-01 | Discharge: 2015-12-02 | Disposition: A | Discharge status: Home / Self Care | Payer: Medicare Other | Attending: Emergency Medicine | Admitting: Emergency Medicine

## 2015-12-01 ENCOUNTER — Emergency Department (HOSPITAL_COMMUNITY): Payer: Medicare Other

## 2015-12-01 DIAGNOSIS — R200 Anesthesia of skin: Secondary | ICD-10-CM | POA: Insufficient documentation

## 2015-12-01 DIAGNOSIS — S46812A Strain of other muscles, fascia and tendons at shoulder and upper arm level, left arm, initial encounter: Secondary | ICD-10-CM | POA: Diagnosis not present

## 2015-12-01 DIAGNOSIS — X58XXXA Exposure to other specified factors, initial encounter: Secondary | ICD-10-CM | POA: Insufficient documentation

## 2015-12-01 DIAGNOSIS — Y939 Activity, unspecified: Secondary | ICD-10-CM | POA: Insufficient documentation

## 2015-12-01 DIAGNOSIS — M542 Cervicalgia: Secondary | ICD-10-CM

## 2015-12-01 DIAGNOSIS — Z87891 Personal history of nicotine dependence: Secondary | ICD-10-CM | POA: Diagnosis not present

## 2015-12-01 DIAGNOSIS — J449 Chronic obstructive pulmonary disease, unspecified: Secondary | ICD-10-CM | POA: Diagnosis not present

## 2015-12-01 DIAGNOSIS — J45909 Unspecified asthma, uncomplicated: Secondary | ICD-10-CM | POA: Diagnosis not present

## 2015-12-01 DIAGNOSIS — N3941 Urge incontinence: Secondary | ICD-10-CM | POA: Diagnosis not present

## 2015-12-01 DIAGNOSIS — Y929 Unspecified place or not applicable: Secondary | ICD-10-CM | POA: Insufficient documentation

## 2015-12-01 DIAGNOSIS — Z791 Long term (current) use of non-steroidal anti-inflammatories (NSAID): Secondary | ICD-10-CM | POA: Insufficient documentation

## 2015-12-01 DIAGNOSIS — R0789 Other chest pain: Secondary | ICD-10-CM

## 2015-12-01 DIAGNOSIS — Z79899 Other long term (current) drug therapy: Secondary | ICD-10-CM | POA: Diagnosis not present

## 2015-12-01 DIAGNOSIS — Y999 Unspecified external cause status: Secondary | ICD-10-CM | POA: Insufficient documentation

## 2015-12-01 DIAGNOSIS — I10 Essential (primary) hypertension: Secondary | ICD-10-CM | POA: Diagnosis not present

## 2015-12-01 DIAGNOSIS — R079 Chest pain, unspecified: Secondary | ICD-10-CM | POA: Diagnosis not present

## 2015-12-01 LAB — CBC WITH DIFFERENTIAL/PLATELET
BASOS ABS: 0 10*3/uL (ref 0.0–0.1)
Basophils Relative: 0 %
EOS PCT: 2 %
Eosinophils Absolute: 0.2 10*3/uL (ref 0.0–0.7)
HEMATOCRIT: 44.4 % (ref 39.0–52.0)
Hemoglobin: 15.3 g/dL (ref 13.0–17.0)
LYMPHS ABS: 3.7 10*3/uL (ref 0.7–4.0)
LYMPHS PCT: 41 %
MCH: 30.5 pg (ref 26.0–34.0)
MCHC: 34.5 g/dL (ref 30.0–36.0)
MCV: 88.4 fL (ref 78.0–100.0)
MONO ABS: 0.6 10*3/uL (ref 0.1–1.0)
MONOS PCT: 6 %
NEUTROS ABS: 4.5 10*3/uL (ref 1.7–7.7)
Neutrophils Relative %: 51 %
PLATELETS: 199 10*3/uL (ref 150–400)
RBC: 5.02 MIL/uL (ref 4.22–5.81)
RDW: 14.8 % (ref 11.5–15.5)
WBC: 8.9 10*3/uL (ref 4.0–10.5)

## 2015-12-01 LAB — COMPREHENSIVE METABOLIC PANEL
ALT: 15 U/L — ABNORMAL LOW (ref 17–63)
ANION GAP: 8 (ref 5–15)
AST: 20 U/L (ref 15–41)
Albumin: 4.2 g/dL (ref 3.5–5.0)
Alkaline Phosphatase: 85 U/L (ref 38–126)
BUN: 11 mg/dL (ref 6–20)
CHLORIDE: 104 mmol/L (ref 101–111)
CO2: 27 mmol/L (ref 22–32)
Calcium: 8.8 mg/dL — ABNORMAL LOW (ref 8.9–10.3)
Creatinine, Ser: 1.13 mg/dL (ref 0.61–1.24)
Glucose, Bld: 112 mg/dL — ABNORMAL HIGH (ref 65–99)
POTASSIUM: 4.1 mmol/L (ref 3.5–5.1)
Sodium: 139 mmol/L (ref 135–145)
Total Bilirubin: 0.5 mg/dL (ref 0.3–1.2)
Total Protein: 7.5 g/dL (ref 6.5–8.1)

## 2015-12-01 LAB — TROPONIN I: Troponin I: <0.03 ng/mL (ref <0.031)

## 2015-12-01 NOTE — ED Provider Notes (Signed)
CSN: [id number]     Arrival date & time 12/01/15  2103 History  By signing my name below, I, Dora Sims, attest that this documentation has been prepared under the direction and in the presence of physician practitioner, Rolland Porter, MD at 2355,. Electronically Signed: Dora Sims, Scribe. 12/01/2015. 11:04 PM.    Chief Complaint  Patient presents with  . Chest Pain    The history is provided by the patient. No language interpreter was used.     HPI Comments: Bryan [name] is a 60 y.o. male with h/o asthma, COPD, HTN, acid reflux, and chronic pain who presents to the Emergency Department complaining of sudden onset, intermittent, sharp, left lower chest pain for the last 3 days. Pt states that his chest pain lasts for around 10 minutes when it presents; he notes that the pain radiates into his left arm. Pt notes intermittent left arm numbness as well but states that he is still able to use his left hand when numbness presents. He is right hand dominant. He endorses bilateral neck pain onset today and states that the left side is worse. He notes bilateral headaches, SOB, nausea, and intermittent dry cough since onset of symptoms as well. He states that his chest pain is not worsened by any particular activity and notes no alleviating factors. He notes that his chest is not in pain currently. Pt additionally notes that he has been wheezing more than usual since onset of symptoms. He has not had any known change in activity recently and has not experienced similar pain/symptoms in the past. . Pt denies history of family heart problems. He notes that he has a plate in his left front neck. He denies sore throat, rhinorrhea, vomiting, diarrhea, or any other associated symptoms.  PCP: Dr. Legrand Rams  Past Medical History  Diagnosis Date  . Chronic pain     with leg and back pain (disc problem)  . History of upper GI x-ray series     to follow showed large duodenal ulcer H pylori serologies were negative   . Acid reflux   . Hypertension   . Asthma   . COPD (chronic obstructive pulmonary disease) ([place])   . Tubular adenoma    Past Surgical History  Procedure Laterality Date  . Esophagogastroduodenoscopy  1/06    Dr. Volney American esophageal erosions,U-shaped stomach,marked erosions and edema of the bulb without discrete ulcer disease.   . Back surgery  7/05; 5/09     Dr.Hirsch,3 lumbar  . Neck surgery  2/09  . Back surgery    . Colonoscopy  Feb 2012    Dr. Gala Romney: normal rectum, pedunculate polyp removed but not recovered  . Colonoscopy with esophagogastroduodenoscopy (egd) N/A 11/18/2013    Dr.Rourk- tcs= normal rectum, multipe polyps about the ileocecal valve and distal transverse colon o/w the remainder of the colonic mucosa appeared normal bx= tubular adenoma. EGD= normal esophagus, stomach with scattered erosions mottling, friablility, no ulcer or infiltrating process patent pylorus bx= chronic inflammation. next TCS 11/2016   Family History  Problem Relation Age of Onset  . Cancer Father   . Asthma Mother   . Colon cancer Neg Hx    Social History  Substance Use Topics  . Smoking status: Former Smoker -- 0.50 packs/day for 40 years    Types: Cigarettes    Quit date: 11/11/2014  . Smokeless tobacco: Never Used  . Alcohol Use: No  Pt is a former smoker.  He does not use ETOH.  Pt is  disabled and does not work; he has had three back surgeries  Review of Systems  HENT: Negative for rhinorrhea and sore throat.   Respiratory: Positive for cough (dry), shortness of breath and wheezing.   Cardiovascular: Positive for chest pain.  Gastrointestinal: Positive for nausea. Negative for vomiting and diarrhea.  Musculoskeletal: Positive for neck pain.  Neurological: Positive for numbness (left hand) and headaches.  All other systems reviewed and are negative.     Allergies  Review of patient's allergies indicates no known allergies.  Home Medications   Prior to Admission  medications   Medication Sig Start Date End Date Taking? Authorizing Provider  ibuprofen (ADVIL,MOTRIN) 200 MG tablet Take 800 mg by mouth every 8 (eight) hours as needed for mild pain or moderate pain.    Yes Historical Provider, MD  lisinopril (PRINIVIL,ZESTRIL) 20 MG tablet Take 20 mg by mouth daily.   Yes Historical Provider, MD  NEXIUM 40 MG capsule TAKE 1 CAPSULE BY MOUTH TWICE DAILY BEFORE A MEAL 01/27/15  Yes Carlis Stable, NP  sulfamethoxazole-trimethoprim (BACTRIM DS,SEPTRA DS) 800-160 MG tablet Take 1 tablet by mouth 2 (two) times daily. 11/30/15  Yes Historical Provider, MD  SYMBICORT 160-4.5 MCG/ACT inhaler Inhale 1 puff into the lungs 2 (two) times daily. 09/25/15  Yes Tammy S Parrett, NP  tamsulosin (FLOMAX) 0.4 MG CAPS capsule Take 1 capsule by mouth daily. 05/26/15  Yes Historical Provider, MD  albuterol (PROVENTIL HFA;VENTOLIN HFA) 108 (90 BASE) MCG/ACT inhaler Inhale 1-2 puffs into the lungs every 4 (four) hours as needed for wheezing or shortness of breath. 07/08/15   Fransico Meadow, PA-C  cyclobenzaprine (FLEXERIL) 5 MG tablet Take 1 tablet (5 mg total) by mouth 3 (three) times daily as needed (muscle soreness). 12/02/15   Rolland Porter, MD  naproxen (NAPROSYN) 250 MG tablet Take 1 po BID with food prn pain 12/02/15   Rolland Porter, MD   BP 126/73 mmHg  Pulse 87  Temp(Src) 98 F (36.7 C) (Temporal)  Resp 20  Ht _0  (1.6 m)  Wt 123 lb (55.792 kg)  BMI 21.79 kg/m2  SpO2 95%  Vital signs normal   Physical Exam  Constitutional: He is oriented to person, place, and time. He appears well-developed and well-nourished.  Non-toxic appearance. He does not appear ill. No distress.  HENT:  Head: Normocephalic and atraumatic.  Right Ear: External ear normal.  Left Ear: External ear normal.  Nose: Nose normal. No mucosal edema or rhinorrhea.  Mouth/Throat: Oropharynx is clear and moist and mucous membranes are normal. No dental abscesses or uvula swelling.  Eyes: Conjunctivae and EOM are  normal. Pupils are equal, round, and reactive to light.  Neck: Normal range of motion and full passive range of motion without pain. Neck supple.    Nontender midline cervical spine, pain in the Paraspinous muscles and the proximal trapezius bilaterally  Cardiovascular: Normal rate, regular rhythm and normal heart sounds.  Exam reveals no gallop and no friction rub.   No murmur heard. Pulmonary/Chest: Effort normal and breath sounds normal. No respiratory distress. He has no wheezes. He has no rhonchi. He has no rales. He exhibits no tenderness and no crepitus.    Area of chest pain noted  Abdominal: Soft. Normal appearance and bowel sounds are normal. He exhibits no distension. There is no tenderness. There is no rebound and no guarding.  Musculoskeletal: Normal range of motion. He exhibits no edema or tenderness.  No weakness to the radial or median nerve Minor  weakness when testing the ulnar nerve  Neurological: He is alert and oriented to person, place, and time. He has normal strength. No cranial nerve deficit.  Skin: Skin is warm, dry and intact. No rash noted. No erythema. No pallor.  Psychiatric: He has a normal mood and affect. His speech is normal and behavior is normal. His mood appears not anxious.  Nursing note and vitals reviewed.   ED Course  Procedures (including critical care time)  Medications  naproxen (NAPROSYN) tablet 250 mg (250 mg Oral Given 12/02/15 0030)  cyclobenzaprine (FLEXERIL) tablet 5 mg (5 mg Oral Given 12/02/15 0030)  predniSONE (DELTASONE) tablet 60 mg (60 mg Oral Given 12/02/15 0030)     DIAGNOSTIC STUDIES: Oxygen Saturation is 95% on RA, adequate by my interpretation.    COORDINATION OF CARE: 11:04 PM Discussed treatment plan with pt at bedside and pt agreed to plan.X-ray of the cervical spine was ordered because of his complaints of neck pain and the numbness in his left finger. He was placed on anti-inflammatory muscle relaxer. He also was given a  dose of steroids.  Recheck at 2 AM we discussed his x-ray results. Patient states his pain is much improved. At this point he was felt stable for discharge. We discussed to be rechecked if he gets constant chest pain or his symptoms get worse.   Labs Review Results for orders placed or performed during the hospital encounter of 12/01/15  Troponin I  Result Value Ref Range   Troponin I <0.03 <0.031 ng/mL  CBC with Differential  Result Value Ref Range   WBC 8.9 4.0 - 10.5 K/uL   RBC 5.02 4.22 - 5.81 MIL/uL   Hemoglobin 15.3 13.0 - 17.0 g/dL   HCT 44.4 39.0 - 52.0 %   MCV 88.4 78.0 - 100.0 fL   MCH 30.5 26.0 - 34.0 pg   MCHC 34.5 30.0 - 36.0 g/dL   RDW 14.8 11.5 - 15.5 %   Platelets 199 150 - 400 K/uL   Neutrophils Relative % 51 %   Neutro Abs 4.5 1.7 - 7.7 K/uL   Lymphocytes Relative 41 %   Lymphs Abs 3.7 0.7 - 4.0 K/uL   Monocytes Relative 6 %   Monocytes Absolute 0.6 0.1 - 1.0 K/uL   Eosinophils Relative 2 %   Eosinophils Absolute 0.2 0.0 - 0.7 K/uL   Basophils Relative 0 %   Basophils Absolute 0.0 0.0 - 0.1 K/uL  Comprehensive metabolic panel  Result Value Ref Range   Sodium 139 135 - 145 mmol/L   Potassium 4.1 3.5 - 5.1 mmol/L   Chloride 104 101 - 111 mmol/L   CO2 27 22 - 32 mmol/L   Glucose, Bld 112 (H) 65 - 99 mg/dL   BUN 11 6 - 20 mg/dL   Creatinine, Ser 1.13 0.61 - 1.24 mg/dL   Calcium 8.8 (L) 8.9 - 10.3 mg/dL   Total Protein 7.5 6.5 - 8.1 g/dL   Albumin 4.2 3.5 - 5.0 g/dL   AST 20 15 - 41 U/L   ALT 15 (L) 17 - 63 U/L   Alkaline Phosphatase 85 38 - 126 U/L   Total Bilirubin 0.5 0.3 - 1.2 mg/dL   GFR calc non Af Amer >60 >60 mL/min   GFR calc Af Amer >60 >60 mL/min   Anion gap 8 5 - 15    Laboratory interpretation all normal    Imaging Review Dg Chest 2 View  12/01/2015  CLINICAL DATA:  Left-sided chest pain  into the left shoulder and left arm for 2 days. EXAM: CHEST  2 VIEW COMPARISON:  07/08/2015 FINDINGS: Normal heart size and pulmonary vascularity. No  focal airspace disease or consolidation in the lungs. No blunting of costophrenic angles. No pneumothorax. Mediastinal contours appear intact. Postoperative changes in the cervical spine. Postoperative changes in the lumbar spine. IMPRESSION: No active cardiopulmonary disease. Electronically Signed   By: Lucienne Capers M.D.   On: 12/01/2015 22:00   Dg Cervical Spine Complete  12/02/2015  CLINICAL DATA:  Left neck pain radiating to the left hand for 4 days. No injury. EXAM: CERVICAL SPINE - COMPLETE 4+ VIEW COMPARISON:  11/17/2011 FINDINGS: Straightening of the usual cervical lordosis is likely postoperative. Postoperative changes with anterior plate and screw fixation and intervertebral fusion from C4 through C7. Disc space at C4-5 remains visible suggesting nonunion. This appearance is similar to previous study. Prominent anterior osteophytes demonstrated at C3-4 and C7-T1. No anterior subluxation of the cervical vertebrae. No vertebral compression deformities. No prevertebral soft tissue swelling. Degenerative changes in the facet joints with uncovertebral spurring. No significant bone encroachment upon the neural foramina. No focal bone lesion or bone destruction. C1-2 articulation appears intact. IMPRESSION: Postoperative changes with anterior plate and screw fixation from C4 through C7. Degenerative changes. Appearance is similar to previous study. Electronically Signed   By: Lucienne Capers M.D.   On: 12/02/2015 00:27   I have personally reviewed and evaluated these images and lab results as part of my medical decision-making.   EKG Interpretation   Date/Time:  Tuesday December 01 2015 21:16:27 EDT Ventricular Rate:  82 PR Interval:  124 QRS Duration: 78 QT Interval:  348 QTC Calculation: 406 R Axis:   71 Text Interpretation:  Normal sinus rhythm Normal ECG No significant change  since last tracing 21 Dec 2014 Confirmed by [facility]  MD-I, Mads Borgmeyer ([id number]) on  12/01/2015 11:02:01 PM      MDM    Final diagnoses:  Neck pain, acute  Trapezius strain, left, initial encounter  Atypical chest pain  Numbness of finger   New Prescriptions   CYCLOBENZAPRINE (FLEXERIL) 5 MG TABLET    Take 1 tablet (5 mg total) by mouth 3 (three) times daily as needed (muscle soreness).   NAPROXEN (NAPROSYN) 250 MG TABLET    Take 1 po BID with food prn pain    Plan discharge  Rolland Porter, MD, FACEP   I personally performed the services described in this documentation, which was scribed in my presence. The recorded information has been reviewed and considered.  Rolland Porter, MD, Barbette Or, MD 12/02/15 [phone]

## 2015-12-01 NOTE — ED Notes (Signed)
Pt c/o sharp, left sided chest pain that started yesterday that is radiating down his left arm.

## 2015-12-01 NOTE — ED Notes (Signed)
Pt states he started having chest pain yesterday with left arm numbness and pain in the joint. Reports he came to the ED yesterday but said there were too many people and left.

## 2015-12-02 ENCOUNTER — Emergency Department (HOSPITAL_COMMUNITY): Payer: Medicare Other

## 2015-12-02 DIAGNOSIS — M542 Cervicalgia: Secondary | ICD-10-CM | POA: Diagnosis not present

## 2015-12-02 MED ORDER — NAPROXEN 250 MG PO TABS
250.0000 mg | ORAL_TABLET | Freq: Once | ORAL | Status: AC
  Administered 2015-12-02: 250 mg via ORAL
  Filled 2015-12-02: qty 1

## 2015-12-02 MED ORDER — PREDNISONE 50 MG PO TABS
60.0000 mg | ORAL_TABLET | Freq: Once | ORAL | Status: AC
  Administered 2015-12-02: 60 mg via ORAL
  Filled 2015-12-02: qty 1

## 2015-12-02 MED ORDER — CYCLOBENZAPRINE HCL 5 MG PO TABS: 5.0000 mg | ORAL_TABLET | Freq: Three times a day (TID) | ORAL | Status: DC | PRN

## 2015-12-02 MED ORDER — CYCLOBENZAPRINE HCL 10 MG PO TABS
5.0000 mg | ORAL_TABLET | Freq: Once | ORAL | Status: AC
  Administered 2015-12-02: 5 mg via ORAL
  Filled 2015-12-02: qty 1

## 2015-12-02 MED ORDER — NAPROXEN 250 MG PO TABS: ORAL_TABLET | ORAL | Status: DC

## 2015-12-02 NOTE — Discharge Instructions (Signed)
Use ice and heat on the sore muscle in your neck for comfort. Take the medication as prescribed. Recheck if your chest pain gets worse or starts lasting longer such as over 30 minutes. Also recheck if the numbness in your finger does not go away. Cryotherapy Cryotherapy is when you put ice on your injury. Ice helps lessen pain and puffiness (swelling) after an injury. Ice works the best when you start using it in the first 24 to 48 hours after an injury. HOME CARE  Put a dry or damp towel between the ice pack and your skin.  You may press gently on the ice pack.  Leave the ice on for no more than 10 to 20 minutes at a time.  Check your skin after 5 minutes to make sure your skin is okay.  Rest at least 20 minutes between ice pack uses.  Stop using ice when your skin loses feeling (numbness).  Do not use ice on someone who cannot tell you when it hurts. This includes small children and people with memory problems (dementia). GET HELP RIGHT AWAY IF:  You have white spots on your skin.  Your skin turns blue or pale.  Your skin feels waxy or hard.  Your puffiness gets worse. MAKE SURE YOU:   Understand these instructions.  Will watch your condition.  Will get help right away if you are not doing well or get worse.   This information is not intended to replace advice given to you by your health care provider. Make sure you discuss any questions you have with your health care provider.   Document Released: 02/15/2008 Document Revised: 11/21/2011 Document Reviewed: 04/21/2011 Elsevier Interactive Patient Education [employer].

## 2015-12-02 NOTE — ED Notes (Signed)
Pt ambulated to restroom with steady and even gait.

## 2015-12-21 DIAGNOSIS — K219 Gastro-esophageal reflux disease without esophagitis: Secondary | ICD-10-CM | POA: Diagnosis not present

## 2015-12-21 DIAGNOSIS — J449 Chronic obstructive pulmonary disease, unspecified: Secondary | ICD-10-CM | POA: Diagnosis not present

## 2016-01-11 DIAGNOSIS — R3915 Urgency of urination: Secondary | ICD-10-CM | POA: Diagnosis not present

## 2016-01-11 DIAGNOSIS — N4281 Prostatodynia syndrome: Secondary | ICD-10-CM | POA: Diagnosis not present

## 2016-01-26 DIAGNOSIS — R3915 Urgency of urination: Secondary | ICD-10-CM | POA: Diagnosis not present

## 2016-01-26 DIAGNOSIS — R351 Nocturia: Secondary | ICD-10-CM | POA: Diagnosis not present

## 2016-01-26 DIAGNOSIS — N3941 Urge incontinence: Secondary | ICD-10-CM | POA: Diagnosis not present

## 2016-02-01 DIAGNOSIS — Z79899 Other long term (current) drug therapy: Secondary | ICD-10-CM | POA: Diagnosis not present

## 2016-02-01 DIAGNOSIS — I10 Essential (primary) hypertension: Secondary | ICD-10-CM | POA: Diagnosis not present

## 2016-02-01 DIAGNOSIS — K219 Gastro-esophageal reflux disease without esophagitis: Secondary | ICD-10-CM | POA: Diagnosis not present

## 2016-02-01 DIAGNOSIS — J449 Chronic obstructive pulmonary disease, unspecified: Secondary | ICD-10-CM | POA: Diagnosis not present

## 2016-02-01 DIAGNOSIS — R351 Nocturia: Secondary | ICD-10-CM | POA: Diagnosis not present

## 2016-02-01 DIAGNOSIS — N3941 Urge incontinence: Secondary | ICD-10-CM | POA: Diagnosis not present

## 2016-02-01 DIAGNOSIS — Z87891 Personal history of nicotine dependence: Secondary | ICD-10-CM | POA: Diagnosis not present

## 2016-02-01 DIAGNOSIS — N401 Enlarged prostate with lower urinary tract symptoms: Secondary | ICD-10-CM | POA: Diagnosis not present

## 2016-02-03 DIAGNOSIS — N401 Enlarged prostate with lower urinary tract symptoms: Secondary | ICD-10-CM | POA: Diagnosis not present

## 2016-02-03 DIAGNOSIS — Z79899 Other long term (current) drug therapy: Secondary | ICD-10-CM | POA: Diagnosis not present

## 2016-02-03 DIAGNOSIS — R32 Unspecified urinary incontinence: Secondary | ICD-10-CM | POA: Diagnosis not present

## 2016-02-03 DIAGNOSIS — K219 Gastro-esophageal reflux disease without esophagitis: Secondary | ICD-10-CM | POA: Diagnosis not present

## 2016-02-03 DIAGNOSIS — R351 Nocturia: Secondary | ICD-10-CM | POA: Diagnosis not present

## 2016-02-03 DIAGNOSIS — Z87891 Personal history of nicotine dependence: Secondary | ICD-10-CM | POA: Diagnosis not present

## 2016-02-03 DIAGNOSIS — J449 Chronic obstructive pulmonary disease, unspecified: Secondary | ICD-10-CM | POA: Diagnosis not present

## 2016-02-03 DIAGNOSIS — M069 Rheumatoid arthritis, unspecified: Secondary | ICD-10-CM | POA: Diagnosis not present

## 2016-02-03 DIAGNOSIS — R3915 Urgency of urination: Secondary | ICD-10-CM | POA: Diagnosis not present

## 2016-02-03 DIAGNOSIS — I10 Essential (primary) hypertension: Secondary | ICD-10-CM | POA: Diagnosis not present

## 2016-02-03 DIAGNOSIS — N3941 Urge incontinence: Secondary | ICD-10-CM | POA: Diagnosis not present

## 2016-02-17 DIAGNOSIS — K219 Gastro-esophageal reflux disease without esophagitis: Secondary | ICD-10-CM | POA: Diagnosis not present

## 2016-02-17 DIAGNOSIS — C61 Malignant neoplasm of prostate: Secondary | ICD-10-CM | POA: Diagnosis not present

## 2016-02-17 DIAGNOSIS — I10 Essential (primary) hypertension: Secondary | ICD-10-CM | POA: Diagnosis not present

## 2016-02-17 DIAGNOSIS — J449 Chronic obstructive pulmonary disease, unspecified: Secondary | ICD-10-CM | POA: Diagnosis not present

## 2016-02-17 DIAGNOSIS — R0602 Shortness of breath: Secondary | ICD-10-CM | POA: Diagnosis not present

## 2016-02-26 ENCOUNTER — Ambulatory Visit (HOSPITAL_COMMUNITY)
Admission: RE | Admit: 2016-02-26 | Discharge: 2016-02-26 | Disposition: A | Discharge status: Home / Self Care | Payer: Medicare Other | Source: Ambulatory Visit | Attending: Internal Medicine | Admitting: Internal Medicine

## 2016-02-26 DIAGNOSIS — Z720 Tobacco use: Secondary | ICD-10-CM | POA: Insufficient documentation

## 2016-02-26 DIAGNOSIS — I358 Other nonrheumatic aortic valve disorders: Secondary | ICD-10-CM | POA: Diagnosis not present

## 2016-02-26 DIAGNOSIS — K219 Gastro-esophageal reflux disease without esophagitis: Secondary | ICD-10-CM | POA: Insufficient documentation

## 2016-02-26 DIAGNOSIS — I071 Rheumatic tricuspid insufficiency: Secondary | ICD-10-CM | POA: Diagnosis not present

## 2016-02-26 DIAGNOSIS — J449 Chronic obstructive pulmonary disease, unspecified: Secondary | ICD-10-CM | POA: Insufficient documentation

## 2016-02-26 DIAGNOSIS — R0602 Shortness of breath: Secondary | ICD-10-CM | POA: Diagnosis not present

## 2016-02-26 DIAGNOSIS — I340 Nonrheumatic mitral (valve) insufficiency: Secondary | ICD-10-CM | POA: Insufficient documentation

## 2016-02-26 DIAGNOSIS — I5189 Other ill-defined heart diseases: Secondary | ICD-10-CM | POA: Insufficient documentation

## 2016-02-26 LAB — ECHOCARDIOGRAM COMPLETE
CHL CUP STROKE VOLUME: 18 mL
CHL CUP TV REG PEAK VELOCITY: 282 cm/s
EERAT: 5.23
EWDT: 183 ms
FS: 17 % — AB (ref 28–44)
IVS/LV PW RATIO, ED: 1.11
LA ID, A-P, ES: 24 mm
LA diam end sys: 24 mm
LA diam index: 1.53 cm/m2
LA vol A4C: 13.9 ml
LA vol: 18 mL
LAVOLIN: 11.5 mL/m2
LV E/e' medial: 5.23
LV PW d: 7.77 mm — AB (ref 0.6–1.1)
LV SIMPSON'S DISK: 41
LV dias vol: 43 mL — AB (ref 62–150)
LV e' LATERAL: 11.3 cm/s
LV sys vol index: 16 mL/m2
LVDIAVOLIN: 27 mL/m2
LVEEAVG: 5.23
LVOT area: 2.27 cm2
LVOTD: 17 mm
LVSYSVOL: 25 mL (ref 21–61)
MV Dec: 183
MV pk A vel: 83.3 m/s
MVPKEVEL: 59.1 m/s
RV TAPSE: 16.1 mm
TDI e' lateral: 11.3
TDI e' medial: 7.29
TR max vel: 282 cm/s

## 2016-03-18 DIAGNOSIS — N3941 Urge incontinence: Secondary | ICD-10-CM | POA: Diagnosis not present

## 2016-03-18 DIAGNOSIS — R3915 Urgency of urination: Secondary | ICD-10-CM | POA: Diagnosis not present

## 2016-03-18 DIAGNOSIS — R300 Dysuria: Secondary | ICD-10-CM | POA: Diagnosis not present

## 2016-04-06 ENCOUNTER — Encounter: Payer: Self-pay | Admitting: Pulmonary Disease

## 2016-04-06 ENCOUNTER — Ambulatory Visit (INDEPENDENT_AMBULATORY_CARE_PROVIDER_SITE_OTHER): Payer: Medicare Other | Admitting: Pulmonary Disease

## 2016-04-06 VITALS — BP 124/84 | HR 76 | Ht 63.0 in | Wt 121.4 lb

## 2016-04-06 DIAGNOSIS — J449 Chronic obstructive pulmonary disease, unspecified: Secondary | ICD-10-CM

## 2016-04-06 DIAGNOSIS — J438 Other emphysema: Secondary | ICD-10-CM

## 2016-04-06 DIAGNOSIS — K219 Gastro-esophageal reflux disease without esophagitis: Secondary | ICD-10-CM

## 2016-04-06 NOTE — Assessment & Plan Note (Signed)
Ct nexium

## 2016-04-06 NOTE — Patient Instructions (Addendum)
Trial of  ANORO         -Call us for Rx if this works Referral to pulmonary rehabilitation in [place]

## 2016-04-06 NOTE — Assessment & Plan Note (Signed)
Trial of  ANORO         -Call us for Rx if this works Referral to pulmonary rehabilitation in [place]

## 2016-04-06 NOTE — Progress Notes (Signed)
   Subjective:    Patient ID: Bryan [name], male    DOB: 04-02-1956, 60 y.o.   MRN: [id number]  HPI   60 year old heavy smoker referred for FU of COPD.  He had a DWI in 2637 and lost his license, he had not driven in over 20 years and renewed his drivers license in 8588. He was not able to blow into the ignition interlock and was referred to 2 pulmonologists to fill out the waiver form. He smoked a pack per day since his teenage years and finally quit in March 2016   He is disabled since 2009 due to a bad back.   04/06/2016  Chief Complaint  Patient presents with  . Follow-up    breathing is not well. chest tightness, feels like he cannot catch his breath.   He reports dyspnea on walking a few yards and cannot climb stairs. He is also limited by claudication in his legs He uses albuterol inhaler about twice daily and reports intermittent wheezing, denies frequent chest colds.  He continues to have problems with the breathalyzer on his ignition-since he cannot blow hard enough to get this to open. His son often has to drive him to places. He denies wheezing, orthopnea or PND. Review of chest x-ray from 11/2015-clear, hyperinflation  Significant tests/ events  Spirometry 2014 - FEV1 of 0.58 - 21% with FEV1 of 29% suggestive of severe airway obstruction.   Spirometry showed Fev1 of 0.97 (37%) , as low as 0.71 (27%) with FVC 1.85 (58%) Spirometry 09/2015  FEV1 21%, ratio 45.   Echo 02/2016 nml LV fn, RVSP 35  Past Medical History:  Diagnosis Date  . Acid reflux   . Asthma   . Chronic pain    with leg and back pain (disc problem)  . COPD (chronic obstructive pulmonary disease) ([place])   . History of upper GI x-ray series    to follow showed large duodenal ulcer H pylori serologies were negative  . Hypertension   . Tubular adenoma      Review of Systems Patient denies significant dyspnea,cough, hemoptysis,  chest pain, palpitations, pedal edema, orthopnea, paroxysmal nocturnal  dyspnea, lightheadedness, nausea, vomiting, abdominal or  leg pains      Objective:   Physical Exam   Gen. Pleasant, well-nourished, short, in no distress ENT - no lesions, no post nasal drip, poor dentition Neck: No JVD, no thyromegaly, no carotid bruits Lungs: no use of accessory muscles, no dullness to percussion, decreased without rales or rhonchi  Cardiovascular: Rhythm regular, heart sounds  normal, no murmurs or gallops, no peripheral edema Musculoskeletal: No deformities, no cyanosis or clubbing         Assessment & Plan:

## 2016-05-14 ENCOUNTER — Other Ambulatory Visit: Payer: Self-pay | Admitting: Nurse Practitioner

## 2016-05-16 ENCOUNTER — Inpatient Hospital Stay (HOSPITAL_COMMUNITY)
Admission: EM | Admit: 2016-05-16 | Discharge: 2016-05-19 | DRG: 683 | Disposition: A | Discharge status: Home / Self Care | Payer: Medicare Other | Attending: Internal Medicine | Admitting: Internal Medicine

## 2016-05-16 ENCOUNTER — Emergency Department (HOSPITAL_COMMUNITY): Payer: Medicare Other

## 2016-05-16 ENCOUNTER — Encounter (HOSPITAL_COMMUNITY): Payer: Self-pay | Admitting: Emergency Medicine

## 2016-05-16 DIAGNOSIS — R52 Pain, unspecified: Secondary | ICD-10-CM

## 2016-05-16 DIAGNOSIS — I129 Hypertensive chronic kidney disease with stage 1 through stage 4 chronic kidney disease, or unspecified chronic kidney disease: Secondary | ICD-10-CM | POA: Diagnosis present

## 2016-05-16 DIAGNOSIS — D649 Anemia, unspecified: Secondary | ICD-10-CM | POA: Diagnosis present

## 2016-05-16 DIAGNOSIS — E860 Dehydration: Secondary | ICD-10-CM | POA: Diagnosis present

## 2016-05-16 DIAGNOSIS — R112 Nausea with vomiting, unspecified: Secondary | ICD-10-CM | POA: Diagnosis not present

## 2016-05-16 DIAGNOSIS — I959 Hypotension, unspecified: Secondary | ICD-10-CM | POA: Diagnosis present

## 2016-05-16 DIAGNOSIS — Z8711 Personal history of peptic ulcer disease: Secondary | ICD-10-CM

## 2016-05-16 DIAGNOSIS — R197 Diarrhea, unspecified: Secondary | ICD-10-CM | POA: Diagnosis not present

## 2016-05-16 DIAGNOSIS — Z825 Family history of asthma and other chronic lower respiratory diseases: Secondary | ICD-10-CM

## 2016-05-16 DIAGNOSIS — Z87891 Personal history of nicotine dependence: Secondary | ICD-10-CM

## 2016-05-16 DIAGNOSIS — J449 Chronic obstructive pulmonary disease, unspecified: Secondary | ICD-10-CM | POA: Diagnosis present

## 2016-05-16 DIAGNOSIS — E871 Hypo-osmolality and hyponatremia: Secondary | ICD-10-CM | POA: Diagnosis present

## 2016-05-16 DIAGNOSIS — K219 Gastro-esophageal reflux disease without esophagitis: Secondary | ICD-10-CM | POA: Diagnosis present

## 2016-05-16 DIAGNOSIS — R1084 Generalized abdominal pain: Secondary | ICD-10-CM | POA: Diagnosis not present

## 2016-05-16 DIAGNOSIS — N179 Acute kidney failure, unspecified: Secondary | ICD-10-CM | POA: Diagnosis not present

## 2016-05-16 DIAGNOSIS — A084 Viral intestinal infection, unspecified: Secondary | ICD-10-CM | POA: Diagnosis present

## 2016-05-16 DIAGNOSIS — E876 Hypokalemia: Secondary | ICD-10-CM | POA: Diagnosis present

## 2016-05-16 DIAGNOSIS — Z809 Family history of malignant neoplasm, unspecified: Secondary | ICD-10-CM | POA: Diagnosis not present

## 2016-05-16 DIAGNOSIS — G8929 Other chronic pain: Secondary | ICD-10-CM | POA: Diagnosis not present

## 2016-05-16 DIAGNOSIS — E875 Hyperkalemia: Secondary | ICD-10-CM | POA: Diagnosis not present

## 2016-05-16 DIAGNOSIS — N170 Acute kidney failure with tubular necrosis: Secondary | ICD-10-CM

## 2016-05-16 DIAGNOSIS — R109 Unspecified abdominal pain: Secondary | ICD-10-CM | POA: Diagnosis not present

## 2016-05-16 DIAGNOSIS — I10 Essential (primary) hypertension: Secondary | ICD-10-CM | POA: Diagnosis present

## 2016-05-16 DIAGNOSIS — E861 Hypovolemia: Secondary | ICD-10-CM | POA: Diagnosis present

## 2016-05-16 DIAGNOSIS — N183 Chronic kidney disease, stage 3 unspecified: Secondary | ICD-10-CM | POA: Diagnosis present

## 2016-05-16 DIAGNOSIS — E872 Acidosis: Secondary | ICD-10-CM | POA: Diagnosis not present

## 2016-05-16 LAB — LIPASE, BLOOD: Lipase: 50 U/L (ref 11–51)

## 2016-05-16 LAB — BASIC METABOLIC PANEL
Anion gap: 14 (ref 5–15)
BUN: 23 mg/dL — AB (ref 6–20)
CO2: 21 mmol/L — ABNORMAL LOW (ref 22–32)
Calcium: 8.7 mg/dL — ABNORMAL LOW (ref 8.9–10.3)
Chloride: 94 mmol/L — ABNORMAL LOW (ref 101–111)
Creatinine, Ser: 3.14 mg/dL — ABNORMAL HIGH (ref 0.61–1.24)
GFR calc Af Amer: 23 mL/min — ABNORMAL LOW (ref >60)
GFR, EST NON AFRICAN AMERICAN: 20 mL/min — AB (ref >60)
GLUCOSE: 104 mg/dL — AB (ref 65–99)
POTASSIUM: 3.8 mmol/L (ref 3.5–5.1)
Sodium: 129 mmol/L — ABNORMAL LOW (ref 135–145)

## 2016-05-16 LAB — COMPREHENSIVE METABOLIC PANEL
ALBUMIN: 4.9 g/dL (ref 3.5–5.0)
ALK PHOS: 123 U/L (ref 38–126)
ALT: 17 U/L (ref 17–63)
ANION GAP: 18 — AB (ref 5–15)
AST: 26 U/L (ref 15–41)
BILIRUBIN TOTAL: 0.8 mg/dL (ref 0.3–1.2)
BUN: 21 mg/dL — AB (ref 6–20)
CALCIUM: 10.2 mg/dL (ref 8.9–10.3)
CO2: 27 mmol/L (ref 22–32)
Chloride: 87 mmol/L — ABNORMAL LOW (ref 101–111)
Creatinine, Ser: 2.93 mg/dL — ABNORMAL HIGH (ref 0.61–1.24)
GFR calc Af Amer: 25 mL/min — ABNORMAL LOW (ref >60)
GFR, EST NON AFRICAN AMERICAN: 22 mL/min — AB (ref >60)
GLUCOSE: 99 mg/dL (ref 65–99)
POTASSIUM: 3.6 mmol/L (ref 3.5–5.1)
Sodium: 132 mmol/L — ABNORMAL LOW (ref 135–145)
TOTAL PROTEIN: 9.1 g/dL — AB (ref 6.5–8.1)

## 2016-05-16 LAB — URINALYSIS, ROUTINE W REFLEX MICROSCOPIC
Glucose, UA: NEGATIVE mg/dL
KETONES UR: 15 mg/dL — AB
LEUKOCYTES UA: NEGATIVE
NITRITE: NEGATIVE
Protein, ur: 100 mg/dL — AB
Specific Gravity, Urine: >1.03 — ABNORMAL HIGH (ref 1.005–1.030)
pH: 5 (ref 5.0–8.0)

## 2016-05-16 LAB — URINE MICROSCOPIC-ADD ON

## 2016-05-16 LAB — CBC
HEMATOCRIT: 47 % (ref 39.0–52.0)
HEMOGLOBIN: 17 g/dL (ref 13.0–17.0)
MCH: 30.6 pg (ref 26.0–34.0)
MCHC: 36.2 g/dL — AB (ref 30.0–36.0)
MCV: 84.5 fL (ref 78.0–100.0)
Platelets: 302 10*3/uL (ref 150–400)
RBC: 5.56 MIL/uL (ref 4.22–5.81)
RDW: 13.3 % (ref 11.5–15.5)
WBC: 9.2 10*3/uL (ref 4.0–10.5)

## 2016-05-16 LAB — I-STAT CG4 LACTIC ACID, ED: LACTIC ACID, VENOUS: 1.12 mmol/L (ref 0.5–1.9)

## 2016-05-16 MED ORDER — IPRATROPIUM-ALBUTEROL 0.5-2.5 (3) MG/3ML IN SOLN
3.0000 mL | Freq: Once | RESPIRATORY_TRACT | Status: AC
  Administered 2016-05-16: 3 mL via RESPIRATORY_TRACT
  Filled 2016-05-16: qty 3

## 2016-05-16 MED ORDER — IOPAMIDOL (ISOVUE-300) INJECTION 61%
INTRAVENOUS | Status: AC
  Filled 2016-05-16: qty 30

## 2016-05-16 MED ORDER — DICYCLOMINE HCL 10 MG/ML IM SOLN
20.0000 mg | Freq: Once | INTRAMUSCULAR | Status: AC
  Administered 2016-05-16: 20 mg via INTRAMUSCULAR
  Filled 2016-05-16: qty 2

## 2016-05-16 MED ORDER — SODIUM CHLORIDE 0.9 % IV SOLN: INTRAVENOUS | Status: DC

## 2016-05-16 MED ORDER — FAMOTIDINE IN NACL 20-0.9 MG/50ML-% IV SOLN
20.0000 mg | Freq: Once | INTRAVENOUS | Status: AC
  Administered 2016-05-16: 20 mg via INTRAVENOUS
  Filled 2016-05-16: qty 50

## 2016-05-16 MED ORDER — SODIUM CHLORIDE 0.9 % IV SOLN
INTRAVENOUS | Status: DC
  Administered 2016-05-16: 20:00:00 via INTRAVENOUS

## 2016-05-16 MED ORDER — SODIUM CHLORIDE 0.9 % IV BOLUS (SEPSIS)
1000.0000 mL | Freq: Once | INTRAVENOUS | Status: AC
  Administered 2016-05-16: 1000 mL via INTRAVENOUS

## 2016-05-16 MED ORDER — BOOST / RESOURCE BREEZE PO LIQD: 1.0000 | Freq: Three times a day (TID) | ORAL | Status: DC

## 2016-05-16 MED ORDER — SODIUM CHLORIDE 0.9 % IV BOLUS (SEPSIS)
500.0000 mL | Freq: Once | INTRAVENOUS | Status: AC
  Administered 2016-05-16: 500 mL via INTRAVENOUS

## 2016-05-16 MED ORDER — ONDANSETRON HCL 4 MG/2ML IJ SOLN
4.0000 mg | INTRAMUSCULAR | Status: AC | PRN
  Administered 2016-05-16 (×2): 4 mg via INTRAVENOUS
  Filled 2016-05-16 (×2): qty 2

## 2016-05-16 NOTE — ED Notes (Addendum)
Family at bedside at this time. Patient's BP reading low. Patient states that his arm goes numb and he is having stomach pains. Patient getting breathing treatment at this time.

## 2016-05-16 NOTE — ED Provider Notes (Signed)
[facility] DEPT Provider Note   CSN: [id number] Arrival date & time: 05/16/16  1603     History   Chief Complaint Chief Complaint  Patient presents with  . Abdominal Pain  . Emesis    HPI Bryan [name] is a 60 y.o. male.  HPI Pt was seen at 1715.  Per pt, c/o gradual onset and persistence of constant generalized abd "pain" since overnight last night. Has been associated with multiple intermittent episodes of N/V.  Describes the abd pain as "cramping."  Pt also requesting "one of my nebulizer treatments." Denies diarrhea, no fevers, no back pain, no rash, no CP/SOB, no black or blood in stools or emesis.      Past Medical History:  Diagnosis Date  . Acid reflux   . Asthma   . Chronic pain    with leg and back pain (disc problem)  . COPD (chronic obstructive pulmonary disease) ([place])   . History of upper GI x-ray series    to follow showed large duodenal ulcer H pylori serologies were negative  . Hypertension   . Tubular adenoma     Patient Active Problem List   Diagnosis Date Noted  . Heme positive stool 10/20/2014  . Abdominal pain, epigastric 06/19/2014  . Esophageal dysphagia 06/19/2014  . Abdominal pain, unspecified site 11/13/2013  . Early satiety 11/13/2013  . COPD, severe ([place]) 02/22/2013  . Low back pain 11/19/2012  . COLITIS 05/12/2009  . ABDOMINAL PAIN, LEFT LOWER QUADRANT 05/12/2009  . DUODENAL ULCER, HX OF 05/12/2009  . ALCOHOL USE 05/11/2009  . GERD 05/11/2009  . Diarrhea 05/11/2009  . SPONDYLOSIS, CERVICAL 07/03/2007  . NECK PAIN 07/03/2007    Past Surgical History:  Procedure Laterality Date  . BACK SURGERY  7/05; 5/09    Dr.Hirsch,3 lumbar  . BACK SURGERY    . COLONOSCOPY  Feb 2012   Dr. Gala Romney: normal rectum, pedunculate polyp removed but not recovered  . COLONOSCOPY WITH ESOPHAGOGASTRODUODENOSCOPY (EGD) N/A 11/18/2013   Dr.Rourk- tcs= normal rectum, multipe polyps about the ileocecal valve and distal transverse colon o/w the remainder of  the colonic mucosa appeared normal bx= tubular adenoma. EGD= normal esophagus, stomach with scattered erosions mottling, friablility, no ulcer or infiltrating process patent pylorus bx= chronic inflammation. next TCS 11/2016  . ESOPHAGOGASTRODUODENOSCOPY  1/06   Dr. Volney American esophageal erosions,U-shaped stomach,marked erosions and edema of the bulb without discrete ulcer disease.   Marland Kitchen NECK SURGERY  2/09       Home Medications    Prior to Admission medications   Medication Sig Start Date End Date Taking? Authorizing Provider  albuterol (PROVENTIL HFA;VENTOLIN HFA) 108 (90 BASE) MCG/ACT inhaler Inhale 1-2 puffs into the lungs every 4 (four) hours as needed for wheezing or shortness of breath. 07/08/15   Fransico Meadow, PA-C  cyclobenzaprine (FLEXERIL) 5 MG tablet Take 1 tablet (5 mg total) by mouth 3 (three) times daily as needed (muscle soreness). 12/02/15   Rolland Porter, MD  ibuprofen (ADVIL,MOTRIN) 200 MG tablet Take 800 mg by mouth every 8 (eight) hours as needed for mild pain or moderate pain.     Historical Provider, MD  ibuprofen (ADVIL,MOTRIN) 800 MG tablet  05/16/16   Historical Provider, MD  lisinopril-hydrochlorothiazide (PRINZIDE,ZESTORETIC) 20-12.5 MG tablet  04/21/16   Historical Provider, MD  NEXIUM 40 MG capsule TAKE 1 CAPSULE BY MOUTH TWICE DAILY BEFORE A MEAL 01/27/15   Carlis Stable, NP  SYMBICORT 160-4.5 MCG/ACT inhaler Inhale 1 puff into the lungs 2 (two)  times daily. 09/25/15   Tammy S Parrett, NP  tamsulosin (FLOMAX) 0.4 MG CAPS capsule Take 1 capsule by mouth daily. 05/26/15   Historical Provider, MD    Family History Family History  Problem Relation Age of Onset  . Cancer Father   . Asthma Mother   . Colon cancer Neg Hx     Social History Social History  Substance Use Topics  . Smoking status: Former Smoker    Packs/day: 0.50    Years: 40.00    Types: Cigarettes    Quit date: 11/11/2014  . Smokeless tobacco: Never Used  . Alcohol use No     Allergies   Review  of patient's allergies indicates no known allergies.   Review of Systems Review of Systems ROS: Statement: All systems negative except as marked or noted in the HPI; Constitutional: Negative for fever and chills. ; ; Eyes: Negative for eye pain, redness and discharge. ; ; ENMT: Negative for ear pain, hoarseness, nasal congestion, sinus pressure and sore throat. ; ; Cardiovascular: Negative for chest pain, palpitations, diaphoresis, dyspnea and peripheral edema. ; ; Respiratory: Negative for cough, wheezing and stridor. ; ; Gastrointestinal: +N/V/D, abd pain. Negative for blood in stool, hematemesis, jaundice and rectal bleeding. . ; ; Genitourinary: Negative for dysuria, flank pain and hematuria. ; ; Musculoskeletal: Negative for back pain and neck pain. Negative for swelling and trauma.; ; Skin: Negative for pruritus, rash, abrasions, blisters, bruising and skin lesion.; ; Neuro: Negative for headache, lightheadedness and neck stiffness. Negative for weakness, altered level of consciousness, altered mental status, extremity weakness, paresthesias, involuntary movement, seizure and syncope.      Physical Exam Updated Vital Signs BP (!) 121/107 (BP Location: Left Arm)   Pulse (!) 127   Temp 98.7 F (37.1 C) (Oral)   Resp 18   Ht _0  (1.6 m)   Wt 122 lb (55.3 kg)   SpO2 97%   BMI 21.61 kg/m   Physical Exam 1720: Physical examination:  Nursing notes reviewed; Vital signs and O2 SAT reviewed;  Constitutional: Well developed, Well nourished, Well hydrated, In no acute distress; Head:  Normocephalic, atraumatic; Eyes: EOMI, PERRL, No scleral icterus; ENMT: Mouth and pharynx normal, Mucous membranes moist; Neck: Supple, Full range of motion, No lymphadenopathy; Cardiovascular: Regular rate and rhythm, No gallop; Respiratory: Breath sounds clear & equal bilaterally, No wheezes.  Speaking full sentences with ease, Normal respiratory effort/excursion; Chest: Nontender, Movement normal; Abdomen: Soft,  +mild diffuse tenderness to palp. No rebound or guarding. Nondistended, Normal bowel sounds; Genitourinary: No CVA tenderness; Extremities: Pulses normal, No tenderness, No edema, No calf edema or asymmetry.; Neuro: AA&Ox3, Major CN grossly intact.  Speech clear. No gross focal motor or sensory deficits in extremities.; Skin: Color normal, Warm, Dry.   ED Treatments / Results  Labs (all labs ordered are listed, but only abnormal results are displayed)   EKG  EKG Interpretation None       Radiology   Procedures Procedures (including critical care time)  Medications Ordered in ED Medications  0.9 %  sodium chloride infusion (not administered)  ondansetron (ZOFRAN) injection 4 mg (not administered)  famotidine (PEPCID) IVPB 20 mg premix (not administered)  dicyclomine (BENTYL) injection 20 mg (not administered)  ipratropium-albuterol (DUONEB) 0.5-2.5 (3) MG/3ML nebulizer solution 3 mL (not administered)     Initial Impression / Assessment and Plan / ED Course  I have reviewed the triage vital signs and the nursing notes.  Pertinent labs & imaging results that were  available during my care of the patient were reviewed by me and considered in my medical decision making (see chart for details).  MDM Reviewed: previous chart, nursing note and vitals Reviewed previous: labs Interpretation: labs, x-ray and CT scan   Results for orders placed or performed during the hospital encounter of 05/16/16  Lipase, blood  Result Value Ref Range   Lipase 50 11 - 51 U/L  Comprehensive metabolic panel  Result Value Ref Range   Sodium 132 (L) 135 - 145 mmol/L   Potassium 3.6 3.5 - 5.1 mmol/L   Chloride 87 (L) 101 - 111 mmol/L   CO2 27 22 - 32 mmol/L   Glucose, Bld 99 65 - 99 mg/dL   BUN 21 (H) 6 - 20 mg/dL   Creatinine, Ser 2.93 (H) 0.61 - 1.24 mg/dL   Calcium 10.2 8.9 - 10.3 mg/dL   Total Protein 9.1 (H) 6.5 - 8.1 g/dL   Albumin 4.9 3.5 - 5.0 g/dL   AST 26 15 - 41 U/L   ALT 17 17  - 63 U/L   Alkaline Phosphatase 123 38 - 126 U/L   Total Bilirubin 0.8 0.3 - 1.2 mg/dL   GFR calc non Af Amer 22 (L) >60 mL/min   GFR calc Af Amer 25 (L) >60 mL/min   Anion gap 18 (H) 5 - 15  CBC  Result Value Ref Range   WBC 9.2 4.0 - 10.5 K/uL   RBC 5.56 4.22 - 5.81 MIL/uL   Hemoglobin 17.0 13.0 - 17.0 g/dL   HCT 47.0 39.0 - 52.0 %   MCV 84.5 78.0 - 100.0 fL   MCH 30.6 26.0 - 34.0 pg   MCHC 36.2 (H) 30.0 - 36.0 g/dL   RDW 13.3 11.5 - 15.5 %   Platelets 302 150 - 400 K/uL   Ct Abdomen Pelvis Wo Contrast Result Date: 05/16/2016 CLINICAL DATA:  Initial evaluation for acute generalized abdominal pain with nausea and vomiting. EXAM: CT ABDOMEN AND PELVIS WITHOUT CONTRAST TECHNIQUE: Multidetector CT imaging of the abdomen and pelvis was performed following the standard protocol without IV contrast. COMPARISON:  Prior CT from 03/11/2014. FINDINGS: Mild hazy atelectatic changes present within the partially visualized lingula. Partially visualized lung bases are otherwise clear. Limited noncontrast evaluation of the liver is unremarkable. Focal fat deposition noted adjacent to the falciform ligament. Gallbladder within normal limits. No biliary dilatation. Spleen, adrenal glands, and pancreas demonstrate a normal unenhanced appearance. Kidneys equal in size without evidence of nephrolithiasis or hydronephrosis. No radiopaque calculi seen along the course of either renal collect system. There is no hydroureter. Stomach within normal limits. No evidence for bowel obstruction. Appendix normal. No abnormal wall thickening or inflammatory fat stranding seen about the bowels. Bladder decompressed without acute abnormality. Prostate within normal limits. No free air or fluid. No adenopathy. Mild aorto bi-iliac atherosclerotic disease. No aneurysm. Postsurgical changes noted within the lumbar spine. No acute osseous abnormality. No worrisome lytic or blastic osseous lesions. IMPRESSION: No CT evidence for acute  intra-abdominal or pelvic process. Electronically Signed   By: Jeannine Boga M.D.   On: 05/16/2016 21:36   Dg Chest 2 View Result Date: 05/16/2016 CLINICAL DATA:  60 year old male with abdominal pain, nausea vomiting and diarrhea. EXAM: CHEST  2 VIEW COMPARISON:  Chest radiograph dated 12/01/2015 FINDINGS: The lungs are clear. There is no pleural effusion or pneumothorax. The cardiac silhouette is within normal limits. No acute osseous pathology identified. Lower cervical spine fixation plate and screws. IMPRESSION: No active  cardiopulmonary disease. Electronically Signed   By: Anner Crete M.D.   On: 05/16/2016 20:33     2210:  BUN/Cr elevated from baseline. Workup otherwise reassuring. After IVF given, will re-check BMP. Udip pending. Will need PO challenge. Sign out to Dr. Wyvonnia Dusky.      Final Clinical Impressions(s) / ED Diagnoses   Final diagnoses:  None    New Prescriptions New Prescriptions   No medications on file     Olon Russ, DO 05/16/16 2213

## 2016-05-16 NOTE — ED Triage Notes (Signed)
Pt reports n/v/d and abd pain since waking up this morning.

## 2016-05-16 NOTE — ED Provider Notes (Signed)
Care assumed from Dr. Thurnell Garbe.  Patient with nausea, vomiting, diarrhea, abdominal pain.  Found to have acute renal failure. Cr 3, from normal baseline.  Blood pressure soft. Plan admission for IVF and recheck of kidney function. D/w Dr. Sherlynn [place], MD 05/17/16 [phone]

## 2016-05-17 ENCOUNTER — Inpatient Hospital Stay (HOSPITAL_COMMUNITY): Payer: Medicare Other

## 2016-05-17 LAB — COMPREHENSIVE METABOLIC PANEL
ALK PHOS: 86 U/L (ref 38–126)
ALT: 13 U/L — AB (ref 17–63)
AST: 28 U/L (ref 15–41)
Albumin: 3.3 g/dL — ABNORMAL LOW (ref 3.5–5.0)
Anion gap: 8 (ref 5–15)
BILIRUBIN TOTAL: 0.7 mg/dL (ref 0.3–1.2)
BUN: 22 mg/dL — AB (ref 6–20)
CO2: 23 mmol/L (ref 22–32)
CREATININE: 2.4 mg/dL — AB (ref 0.61–1.24)
Calcium: 7.9 mg/dL — ABNORMAL LOW (ref 8.9–10.3)
Chloride: 103 mmol/L (ref 101–111)
GFR calc Af Amer: 32 mL/min — ABNORMAL LOW (ref >60)
GFR, EST NON AFRICAN AMERICAN: 28 mL/min — AB (ref >60)
Glucose, Bld: 91 mg/dL (ref 65–99)
Potassium: 3.3 mmol/L — ABNORMAL LOW (ref 3.5–5.1)
Sodium: 134 mmol/L — ABNORMAL LOW (ref 135–145)
TOTAL PROTEIN: 5.9 g/dL — AB (ref 6.5–8.1)

## 2016-05-17 LAB — CBC
HEMATOCRIT: 36.8 % — AB (ref 39.0–52.0)
HEMOGLOBIN: 12.9 g/dL — AB (ref 13.0–17.0)
MCH: 29.8 pg (ref 26.0–34.0)
MCHC: 34.8 g/dL (ref 30.0–36.0)
MCV: 85.8 fL (ref 78.0–100.0)
Platelets: 238 10*3/uL (ref 150–400)
RBC: 4.29 MIL/uL (ref 4.22–5.81)
RDW: 13.5 % (ref 11.5–15.5)
WBC: 7.8 10*3/uL (ref 4.0–10.5)

## 2016-05-17 MED ORDER — ONDANSETRON HCL 4 MG/2ML IJ SOLN
4.0000 mg | Freq: Four times a day (QID) | INTRAMUSCULAR | Status: DC | PRN
  Administered 2016-05-17 – 2016-05-19 (×3): 4 mg via INTRAVENOUS
  Filled 2016-05-17 (×3): qty 2

## 2016-05-17 MED ORDER — ALBUTEROL SULFATE (2.5 MG/3ML) 0.083% IN NEBU: 3.0000 mL | INHALATION_SOLUTION | RESPIRATORY_TRACT | Status: DC | PRN

## 2016-05-17 MED ORDER — SODIUM CHLORIDE 0.9 % IV SOLN
INTRAVENOUS | Status: DC
  Administered 2016-05-17 – 2016-05-18 (×3): via INTRAVENOUS

## 2016-05-17 MED ORDER — ONDANSETRON HCL 4 MG PO TABS: 4.0000 mg | ORAL_TABLET | Freq: Four times a day (QID) | ORAL | Status: DC | PRN

## 2016-05-17 MED ORDER — SODIUM CHLORIDE 0.9 % IV SOLN
INTRAVENOUS | Status: AC
  Administered 2016-05-17 (×2): via INTRAVENOUS

## 2016-05-17 MED ORDER — SODIUM CHLORIDE 0.9% FLUSH
3.0000 mL | Freq: Two times a day (BID) | INTRAVENOUS | Status: DC
  Administered 2016-05-17 – 2016-05-18 (×2): 3 mL via INTRAVENOUS

## 2016-05-17 MED ORDER — ENSURE ENLIVE PO LIQD
237.0000 mL | Freq: Two times a day (BID) | ORAL | Status: DC
  Administered 2016-05-17 – 2016-05-18 (×2): 237 mL via ORAL

## 2016-05-17 MED ORDER — POTASSIUM CHLORIDE CRYS ER 20 MEQ PO TBCR
40.0000 meq | EXTENDED_RELEASE_TABLET | Freq: Two times a day (BID) | ORAL | Status: AC
  Administered 2016-05-17 (×2): 40 meq via ORAL
  Filled 2016-05-17 (×2): qty 2

## 2016-05-17 NOTE — Progress Notes (Signed)
Initial Nutrition Assessment  DOCUMENTATION CODES:  Not applicable  INTERVENTION:  Ensure Enlive po BID, each supplement provides 350 kcal and 20 grams of protein  Dinner request  NUTRITION DIAGNOSIS:  Inadequate oral intake related to nausea, vomiting, acute illness as evidenced by Pt report of goin 48 hrs with no PO intake.  GOAL:  Patient will meet greater than or equal to 90% of their needs  MONITOR:  PO intake, Supplement acceptance, Diet advancement, Labs  REASON FOR ASSESSMENT:  Malnutrition Screening Tool    ASSESSMENT:  60 y/o male PMHx HTN, GERD, COPD who presents with acute onset of nausea, vomiting, diarrhea, and abdominal cramps early starting 9/3. Worked up for AKI. Admitted for hypotension, dehydration, AKI.   Pt reports his symptoms started 48 hrs ago and progressively worsened. Up until today, he was unable to tolerate any PO intake since Sunday. He would vomit every time he tried to eat. He said he had constipation and diarrhea "at the same time". He did not follow any diet at home nor did he take any vitamins.   Pt says his UBW is 126 lbs, though he doesn't appear to have weighed this much in the last 6 months.   He is seen with his eaten lunch tray in front of him. He says he tolerated it well and overall feels much better.   RD took Dinner request.   Medications: KCL, ensure, IVF Labs reviewed  Recent Labs Lab 05/16/16 1742 05/16/16 2148 05/17/16 0604  NA 132* 129* 134*  K 3.6 3.8 3.3*  CL 87* 94* 103  CO2 27 21* 23  BUN 21* 23* 22*  CREATININE 2.93* 3.14* 2.40*  CALCIUM 10.2 8.7* 7.9*  GLUCOSE 99 104* 91   Diet Order:  Diet full liquid Room service appropriate? Yes; Fluid consistency: Thin  Skin:  Reviewed, no issues  Last BM:  9/4  Height:  Ht Readings from Last 1 Encounters:  05/16/16 5' 3" (1.6 m)   Weight:  Wt Readings from Last 1 Encounters:  05/16/16 116 lb 4.8 oz (52.8 kg)   Wt Readings from Last 10 Encounters:  05/16/16  116 lb 4.8 oz (52.8 kg)  04/06/16 121 lb 6.4 oz (55.1 kg)  12/01/15 123 lb (55.8 kg)  09/25/15 126 lb (57.2 kg)  07/08/15 126 lb (57.2 kg)  12/21/14 118 lb (53.5 kg)  10/20/14 117 lb (53.1 kg)  06/19/14 115 lb 12.8 oz (52.5 kg)  04/09/14 122 lb (55.3 kg)  11/18/13 119 lb (54 kg)   Ideal Body Weight:  56.36 kg  BMI:  Body mass index is 20.6 kg/m.  Estimated Nutritional Needs:  Kcal:  1600-1800 (30-34 kcal/kg bw) Protein:  58-69 g (1.1-1.3 g/kg bw) Fluid:  >1/6 liters (30 ml/kg bw)  EDUCATION NEEDS:  No education needs identified at this time  Burtis Junes RD, LDN, [facility] Pager: [phone] 05/17/2016 12:55 PM

## 2016-05-17 NOTE — Progress Notes (Signed)
Subjective: Patient was admitted yesterday due to acute renal failure and hypotension following nausea and vomiting. His GI symptoms are much better now. He is being rehydrated and his renal function si improving gradually.  Objective: Vital signs in last 24 hours: Temp:  [98.4 F (36.9 C)-98.7 F (37.1 C)] 98.4 F (36.9 C) (09/05 0541) Pulse Rate:  [71-127] 74 (09/05 0541) Resp:  [16-20] 16 (09/05 0541) BP: (80-121)/(62-107) 108/62 (09/05 0541) SpO2:  [93 %-99 %] 95 % (09/05 0541) Weight:  [52.8 kg (116 lb 4.8 oz)-55.3 kg (122 lb)] 52.8 kg (116 lb 4.8 oz) (09/04 2329) Weight change:  Last BM Date: 05/16/16  Intake/Output from previous day: 09/04 0701 - 09/05 0700 In: 625.4 [P.O.:240; I.V.:385.4] Out: -   PHYSICAL EXAM General appearance: alert and no distress Resp: clear to auscultation bilaterally Cardio: S1, S2 normal GI: soft, non-tender; bowel sounds normal; no masses,  no organomegaly Extremities: extremities normal, atraumatic, no cyanosis or edema  Lab Results:  Results for orders placed or performed during the hospital encounter of 05/16/16 (from the past 48 hour(s))  Urinalysis, Routine w reflex microscopic     Status: Abnormal   Collection Time: 05/16/16  4:13 PM  Result Value Ref Range   Color, Urine YELLOW YELLOW   APPearance CLEAR CLEAR   Specific Gravity, Urine >1.030 (H) 1.005 - 1.030   pH 5.0 5.0 - 8.0   Glucose, UA NEGATIVE NEGATIVE mg/dL   Hgb urine dipstick TRACE (A) NEGATIVE   Bilirubin Urine MODERATE (A) NEGATIVE   Ketones, ur 15 (A) NEGATIVE mg/dL   Protein, ur 100 (A) NEGATIVE mg/dL   Nitrite NEGATIVE NEGATIVE   Leukocytes, UA NEGATIVE NEGATIVE  Urine microscopic-add on     Status: Abnormal   Collection Time: 05/16/16  4:13 PM  Result Value Ref Range   Squamous Epithelial / LPF 0-5 (A) NONE SEEN   WBC, UA 0-5 0 - 5 WBC/hpf   RBC / HPF 0-5 0 - 5 RBC/hpf   Bacteria, UA MANY (A) NONE SEEN   Casts HYALINE CASTS (A) NEGATIVE   Crystals CA  OXALATE CRYSTALS (A) NEGATIVE  Lipase, blood     Status: None   Collection Time: 05/16/16  5:42 PM  Result Value Ref Range   Lipase 50 11 - 51 U/L  Comprehensive metabolic panel     Status: Abnormal   Collection Time: 05/16/16  5:42 PM  Result Value Ref Range   Sodium 132 (L) 135 - 145 mmol/L   Potassium 3.6 3.5 - 5.1 mmol/L   Chloride 87 (L) 101 - 111 mmol/L   CO2 27 22 - 32 mmol/L   Glucose, Bld 99 65 - 99 mg/dL   BUN 21 (H) 6 - 20 mg/dL   Creatinine, Ser 2.93 (H) 0.61 - 1.24 mg/dL   Calcium 10.2 8.9 - 10.3 mg/dL   Total Protein 9.1 (H) 6.5 - 8.1 g/dL   Albumin 4.9 3.5 - 5.0 g/dL   AST 26 15 - 41 U/L   ALT 17 17 - 63 U/L   Alkaline Phosphatase 123 38 - 126 U/L   Total Bilirubin 0.8 0.3 - 1.2 mg/dL   GFR calc non Af Amer 22 (L) >60 mL/min   GFR calc Af Amer 25 (L) >60 mL/min    Comment: (NOTE) The eGFR has been calculated using the CKD EPI equation. This calculation has not been validated in all clinical situations. eGFR's persistently <60 mL/min signify possible Chronic Kidney Disease.    Anion gap 18 (H)  5 - 15  CBC     Status: Abnormal   Collection Time: 05/16/16  5:42 PM  Result Value Ref Range   WBC 9.2 4.0 - 10.5 K/uL   RBC 5.56 4.22 - 5.81 MIL/uL   Hemoglobin 17.0 13.0 - 17.0 g/dL   HCT 47.0 39.0 - 52.0 %   MCV 84.5 78.0 - 100.0 fL   MCH 30.6 26.0 - 34.0 pg   MCHC 36.2 (H) 30.0 - 36.0 g/dL   RDW 13.3 11.5 - 15.5 %   Platelets 302 150 - 400 K/uL  Basic metabolic panel     Status: Abnormal   Collection Time: 05/16/16  9:48 PM  Result Value Ref Range   Sodium 129 (L) 135 - 145 mmol/L   Potassium 3.8 3.5 - 5.1 mmol/L   Chloride 94 (L) 101 - 111 mmol/L   CO2 21 (L) 22 - 32 mmol/L   Glucose, Bld 104 (H) 65 - 99 mg/dL   BUN 23 (H) 6 - 20 mg/dL   Creatinine, Ser 3.14 (H) 0.61 - 1.24 mg/dL   Calcium 8.7 (L) 8.9 - 10.3 mg/dL   GFR calc non Af Amer 20 (L) >60 mL/min   GFR calc Af Amer 23 (L) >60 mL/min    Comment: (NOTE) The eGFR has been calculated using the CKD  EPI equation. This calculation has not been validated in all clinical situations. eGFR's persistently <60 mL/min signify possible Chronic Kidney Disease.    Anion gap 14 5 - 15  I-Stat CG4 Lactic Acid, ED     Status: None   Collection Time: 05/16/16 10:58 PM  Result Value Ref Range   Lactic Acid, Venous 1.12 0.5 - 1.9 mmol/L  CBC     Status: Abnormal   Collection Time: 05/17/16  6:04 AM  Result Value Ref Range   WBC 7.8 4.0 - 10.5 K/uL   RBC 4.29 4.22 - 5.81 MIL/uL   Hemoglobin 12.9 (L) 13.0 - 17.0 g/dL    Comment: DELTA CHECK NOTED   HCT 36.8 (L) 39.0 - 52.0 %   MCV 85.8 78.0 - 100.0 fL   MCH 29.8 26.0 - 34.0 pg   MCHC 34.8 30.0 - 36.0 g/dL   RDW 13.5 11.5 - 15.5 %   Platelets 238 150 - 400 K/uL  Comprehensive metabolic panel     Status: Abnormal   Collection Time: 05/17/16  6:04 AM  Result Value Ref Range   Sodium 134 (L) 135 - 145 mmol/L   Potassium 3.3 (L) 3.5 - 5.1 mmol/L   Chloride 103 101 - 111 mmol/L   CO2 23 22 - 32 mmol/L   Glucose, Bld 91 65 - 99 mg/dL   BUN 22 (H) 6 - 20 mg/dL   Creatinine, Ser 2.40 (H) 0.61 - 1.24 mg/dL   Calcium 7.9 (L) 8.9 - 10.3 mg/dL   Total Protein 5.9 (L) 6.5 - 8.1 g/dL   Albumin 3.3 (L) 3.5 - 5.0 g/dL   AST 28 15 - 41 U/L   ALT 13 (L) 17 - 63 U/L   Alkaline Phosphatase 86 38 - 126 U/L   Total Bilirubin 0.7 0.3 - 1.2 mg/dL   GFR calc non Af Amer 28 (L) >60 mL/min   GFR calc Af Amer 32 (L) >60 mL/min    Comment: (NOTE) The eGFR has been calculated using the CKD EPI equation. This calculation has not been validated in all clinical situations. eGFR's persistently <60 mL/min signify possible Chronic Kidney Disease.  Anion gap 8 5 - 15    ABGS No results for input(s): PHART, PO2ART, TCO2, HCO3 in the last 72 hours.  Invalid input(s): PCO2 CULTURES No results found for this or any previous visit (from the past 240 hour(s)). Studies/Results: Ct Abdomen Pelvis Wo Contrast  Result Date: 05/16/2016 CLINICAL DATA:  Initial  evaluation for acute generalized abdominal pain with nausea and vomiting. EXAM: CT ABDOMEN AND PELVIS WITHOUT CONTRAST TECHNIQUE: Multidetector CT imaging of the abdomen and pelvis was performed following the standard protocol without IV contrast. COMPARISON:  Prior CT from 03/11/2014. FINDINGS: Mild hazy atelectatic changes present within the partially visualized lingula. Partially visualized lung bases are otherwise clear. Limited noncontrast evaluation of the liver is unremarkable. Focal fat deposition noted adjacent to the falciform ligament. Gallbladder within normal limits. No biliary dilatation. Spleen, adrenal glands, and pancreas demonstrate a normal unenhanced appearance. Kidneys equal in size without evidence of nephrolithiasis or hydronephrosis. No radiopaque calculi seen along the course of either renal collect system. There is no hydroureter. Stomach within normal limits. No evidence for bowel obstruction. Appendix normal. No abnormal wall thickening or inflammatory fat stranding seen about the bowels. Bladder decompressed without acute abnormality. Prostate within normal limits. No free air or fluid. No adenopathy. Mild aorto bi-iliac atherosclerotic disease. No aneurysm. Postsurgical changes noted within the lumbar spine. No acute osseous abnormality. No worrisome lytic or blastic osseous lesions. IMPRESSION: No CT evidence for acute intra-abdominal or pelvic process. Electronically Signed   By: Jeannine Boga M.D.   On: 05/16/2016 21:36   Dg Chest 2 View  Result Date: 05/16/2016 CLINICAL DATA:  60 year old male with abdominal pain, nausea vomiting and diarrhea. EXAM: CHEST  2 VIEW COMPARISON:  Chest radiograph dated 12/01/2015 FINDINGS: The lungs are clear. There is no pleural effusion or pneumothorax. The cardiac silhouette is within normal limits. No acute osseous pathology identified. Lower cervical spine fixation plate and screws. IMPRESSION: No active cardiopulmonary disease.  Electronically Signed   By: Anner Crete M.D.   On: 05/16/2016 20:33    Medications: I have reviewed the patient's current medications.  Assesment:  Principal Problem:   Acute renal failure (ARF) (HCC) Active Problems:   GERD   DUODENAL ULCER, HX OF   COPD, severe (HCC)   Nausea with vomiting   Hypertension   Chronic pain   Dehydration    Plan:  Medications reviewed Continue IV hydration Will monitor CBC/BMP nephrology consult    LOS: 1 day   Cammi Consalvo 05/17/2016, 8:15 AM

## 2016-05-17 NOTE — H&P (Signed)
History and Physical    Bryan [name] XBM:[id number] DOB: Mar 05, 1956 DOA: 05/16/2016  PCP: Rosita Fire, MD  Patient coming from:  home  Chief Complaint:  Vomiting and diarrhea  HPI: Bryan [name] is a 60 y.o. male with medical history significant for HTN, GERD, chronic pain, COPD comes in with acute onset of nausea, vomiting, diarrhea and abdominal cramps that woke him up at 2 am last night (almost 24 hours ago).  Pt reports he has been vomiting and having multiple episodes of diarrhea most of the day.  He took his bp meds this morning also.  Denies fevers.  No blood in vomit or diarrhea.  No sick contacts.  Pt was not getting better and was feeling weak so came to ED.  He denies any dysuria or urinary symptoms except decrease in output.   Pt found to have AKI, and referred for admission for hypotension, deydration and AKI.  He has been given 3 liters of ivf in the ED and is feel much better.  His vomiting and diarrhea seem to have gotten much better over the last several hours.  No h/o CHF.  Review of Systems: As per HPI otherwise 10 point review of systems negative.   Past Medical History:  Diagnosis Date  . Acid reflux   . Asthma   . Chronic pain    with leg and back pain (disc problem)  . COPD (chronic obstructive pulmonary disease) ([place])   . History of upper GI x-ray series    to follow showed large duodenal ulcer H pylori serologies were negative  . Hypertension   . Tubular adenoma     Past Surgical History:  Procedure Laterality Date  . BACK SURGERY  7/05; 5/09    Dr.Hirsch,3 lumbar  . BACK SURGERY    . COLONOSCOPY  Feb 2012   Dr. Gala Romney: normal rectum, pedunculate polyp removed but not recovered  . COLONOSCOPY WITH ESOPHAGOGASTRODUODENOSCOPY (EGD) N/A 11/18/2013   Dr.Rourk- tcs= normal rectum, multipe polyps about the ileocecal valve and distal transverse colon o/w the remainder of the colonic mucosa appeared normal bx= tubular adenoma. EGD= normal esophagus, stomach with  scattered erosions mottling, friablility, no ulcer or infiltrating process patent pylorus bx= chronic inflammation. next TCS 11/2016  . ESOPHAGOGASTRODUODENOSCOPY  1/06   Dr. Volney American esophageal erosions,U-shaped stomach,marked erosions and edema of the bulb without discrete ulcer disease.   Marland Kitchen NECK SURGERY  2/09     reports that he quit smoking about 18 months ago. His smoking use included Cigarettes. He has a 20.00 pack-year smoking history. He has never used smokeless tobacco. He reports that he uses drugs, including Marijuana. He reports that he does not drink alcohol.  No Known Allergies  Family History  Problem Relation Age of Onset  . Cancer Father   . Asthma Mother   . Colon cancer Neg Hx     Prior to Admission medications   Medication Sig Start Date End Date Taking? Authorizing Provider  cyclobenzaprine (FLEXERIL) 5 MG tablet Take 1 tablet (5 mg total) by mouth 3 (three) times daily as needed (muscle soreness). 12/02/15  Yes Rolland Porter, MD  lisinopril (PRINIVIL,ZESTRIL) 20 MG tablet Take 20 mg by mouth daily.   Yes Historical Provider, MD  NEXIUM 40 MG capsule TAKE 1 CAPSULE BY MOUTH TWICE DAILY BEFORE A MEAL 01/27/15  Yes Carlis Stable, NP  SYMBICORT 160-4.5 MCG/ACT inhaler Inhale 1 puff into the lungs 2 (two) times daily. 09/25/15  Yes Melvenia Needles, NP  albuterol (PROVENTIL HFA;VENTOLIN HFA) 108 (90 BASE) MCG/ACT inhaler Inhale 1-2 puffs into the lungs every 4 (four) hours as needed for wheezing or shortness of breath. 07/08/15   Fransico Meadow, PA-C    Physical Exam: Vitals:   05/16/16 2130 05/16/16 2200 05/16/16 2230 05/16/16 2329  BP: 95/63 (!) 89/63 91/64 111/62  Pulse: 97 87 91 71  Resp:    17  Temp:    98.7 F (37.1 C)  TempSrc:    Oral  SpO2: 94% 94% 97% 99%  Weight:    52.8 kg (116 lb 4.8 oz)  Height:    _0  (1.6 m)    Constitutional: NAD, calm, comfortable Vitals:   05/16/16 2130 05/16/16 2200 05/16/16 2230 05/16/16 2329  BP: 95/63 (!) 89/63 91/64  111/62  Pulse: 97 87 91 71  Resp:    17  Temp:    98.7 F (37.1 C)  TempSrc:    Oral  SpO2: 94% 94% 97% 99%  Weight:    52.8 kg (116 lb 4.8 oz)  Height:    _1  (1.6 m)   Eyes: PERRL, lids and conjunctivae normal ENMT: Mucous membranes are moist. Posterior pharynx clear of any exudate or lesions.Normal dentition.  Neck: normal, supple, no masses, no thyromegaly Respiratory: clear to auscultation bilaterally, no wheezing, no crackles. Normal respiratory effort. No accessory muscle use.  Cardiovascular: Regular rate and rhythm, no murmurs / rubs / gallops. No extremity edema. 2+ pedal pulses. No carotid bruits.  Abdomen: no tenderness, no masses palpated. No hepatosplenomegaly. Bowel sounds positive.  Musculoskeletal: no clubbing / cyanosis. No joint deformity upper and lower extremities. Good ROM, no contractures. Normal muscle tone.  Skin: no rashes, lesions, ulcers. No induration Neurologic: CN 2-12 grossly intact. Sensation intact, DTR normal. Strength 5/5 in all 4.  Psychiatric: Normal judgment and insight. Alert and oriented x 3. Normal mood.    Labs on Admission: I have personally reviewed following labs and imaging studies  CBC:  Recent Labs Lab 05/16/16 1742  WBC 9.2  HGB 17.0  HCT 47.0  MCV 84.5  PLT 916   Basic Metabolic Panel:  Recent Labs Lab 05/16/16 1742 05/16/16 2148  NA 132* 129*  K 3.6 3.8  CL 87* 94*  CO2 27 21*  GLUCOSE 99 104*  BUN 21* 23*  CREATININE 2.93* 3.14*  CALCIUM 10.2 8.7*   GFR: Estimated Creatinine Clearance: 18.7 mL/min (by C-G formula based on SCr of 3.14 mg/dL). Liver Function Tests:  Recent Labs Lab 05/16/16 1742  AST 26  ALT 17  ALKPHOS 123  BILITOT 0.8  PROT 9.1*  ALBUMIN 4.9    Recent Labs Lab 05/16/16 1742  LIPASE 50   Urine analysis:    Component Value Date/Time   COLORURINE YELLOW 05/16/2016 1613   APPEARANCEUR CLEAR 05/16/2016 1613   LABSPEC >1.030 (H) 05/16/2016 1613   PHURINE 5.0 05/16/2016 1613    GLUCOSEU NEGATIVE 05/16/2016 1613   HGBUR TRACE (A) 05/16/2016 1613   BILIRUBINUR MODERATE (A) 05/16/2016 1613   KETONESUR 15 (A) 05/16/2016 1613   PROTEINUR 100 (A) 05/16/2016 1613   UROBILINOGEN 0.2 07/08/2015 1314   NITRITE NEGATIVE 05/16/2016 1613   LEUKOCYTESUR NEGATIVE 05/16/2016 1613    Radiological Exams on Admission: Ct Abdomen Pelvis Wo Contrast  Result Date: 05/16/2016 CLINICAL DATA:  Initial evaluation for acute generalized abdominal pain with nausea and vomiting. EXAM: CT ABDOMEN AND PELVIS WITHOUT CONTRAST TECHNIQUE: Multidetector CT imaging of the abdomen and pelvis was performed following the standard  protocol without IV contrast. COMPARISON:  Prior CT from 03/11/2014. FINDINGS: Mild hazy atelectatic changes present within the partially visualized lingula. Partially visualized lung bases are otherwise clear. Limited noncontrast evaluation of the liver is unremarkable. Focal fat deposition noted adjacent to the falciform ligament. Gallbladder within normal limits. No biliary dilatation. Spleen, adrenal glands, and pancreas demonstrate a normal unenhanced appearance. Kidneys equal in size without evidence of nephrolithiasis or hydronephrosis. No radiopaque calculi seen along the course of either renal collect system. There is no hydroureter. Stomach within normal limits. No evidence for bowel obstruction. Appendix normal. No abnormal wall thickening or inflammatory fat stranding seen about the bowels. Bladder decompressed without acute abnormality. Prostate within normal limits. No free air or fluid. No adenopathy. Mild aorto bi-iliac atherosclerotic disease. No aneurysm. Postsurgical changes noted within the lumbar spine. No acute osseous abnormality. No worrisome lytic or blastic osseous lesions. IMPRESSION: No CT evidence for acute intra-abdominal or pelvic process. Electronically Signed   By: Jeannine Boga M.D.   On: 05/16/2016 21:36   Dg Chest 2 View  Result Date:  05/16/2016 CLINICAL DATA:  60 year old male with abdominal pain, nausea vomiting and diarrhea. EXAM: CHEST  2 VIEW COMPARISON:  Chest radiograph dated 12/01/2015 FINDINGS: The lungs are clear. There is no pleural effusion or pneumothorax. The cardiac silhouette is within normal limits. No acute osseous pathology identified. Lower cervical spine fixation plate and screws. IMPRESSION: No active cardiopulmonary disease. Electronically Signed   By: Anner Crete M.D.   On: 05/16/2016 20:33    Assessment/Plan 60 yo male with acute viral gastroenteritis, dehydration now with AKI in setting of taking ace inhibitor  Principal Problem:  Stable unless o/w noted   Acute renal failure (ARF) ([place])- secondary to volume losses due to GI illness in addition to taking ace inhibitor.  Will also obtain renal ultrasound, but suspect due to prerenal azotemia/ATN.  Cont ivf and repeat cr in am.  Holding all nephrotoxic substances including lisinopril.  Active Problems:   Nausea with vomiting, diarrhea - likely due to viral gastroenteritis, seems to be resolving.  Ct neg.  Supportive care.   Dehydration- as above   GERD   DUODENAL ULCER, HX OF   COPD, severe ([place])- stable   Hypertension   Chronic pain   Admit to tele bed.  PCP is dr Legrand Rams.  DVT prophylaxis: scds  Code Status:   full Family Communication:  none Disposition Plan:  Per day team Consults called:  none Admission status:  admit   Chadd Tollison A MD [facility]  If 7PM-7AM, please contact night-coverage [email or web address] Password [email or web address]  05/17/2016, 12:01 AM

## 2016-05-17 NOTE — Consult Note (Signed)
Bryan [name] MRN: [id number] DOB/AGE: 01-20-1956 60 y.o. Primary Care Physician:FANTA,TESFAYE, MD Admit date: 05/16/2016 Chief Complaint:  Chief Complaint  Patient presents with  . Abdominal Pain  . Emesis   HPI:  Pt is a 60 y.o. male with medical history significant for HTN,COPD who came to ER with acute onset of nausea, vomiting, diarrhea and abdominal cramps .  HPI dates back to 05/15/16 when pt had sudden onset of nausea, vomiting and having multiple episodes of diarrhea most of the day. the patient later came to ER and found to be hypotensive and in AKI. Pt was admitted for further care. Pt seen today on 3rd floor. Pt feels better. Pt offer  No c/o blood in vomit or diarrhea.   Nohx of sick contacts.   NO c/o any dysuria or urinary symptoms . Pt says he had decrease in  Urine output before admission but now its better   Past Medical History:  Diagnosis Date  . Acid reflux   . Asthma   . Chronic pain    with leg and back pain (disc problem)  . COPD (chronic obstructive pulmonary disease) ([place])   . History of upper GI x-ray series    to follow showed large duodenal ulcer H pylori serologies were negative  . Hypertension   . Tubular adenoma        Family History  Problem Relation Age of Onset  . Cancer Father   . Asthma Mother   . Colon cancer Neg Hx     Social History:  reports that he quit smoking about 18 months ago. His smoking use included Cigarettes. He has a 20.00 pack-year smoking history. He has never used smokeless tobacco. He reports that he uses drugs, including Marijuana. He reports that he does not drink alcohol.   Allergies: No Known Allergies  Medications Prior to Admission  Medication Sig Dispense Refill  . cyclobenzaprine (FLEXERIL) 5 MG tablet Take 1 tablet (5 mg total) by mouth 3 (three) times daily as needed (muscle soreness). 30 tablet 0  . lisinopril (PRINIVIL,ZESTRIL) 20 MG tablet Take 20 mg by mouth daily.    Marland Kitchen NEXIUM 40 MG capsule TAKE 1  CAPSULE BY MOUTH TWICE DAILY BEFORE A MEAL 60 capsule 5  . SYMBICORT 160-4.5 MCG/ACT inhaler Inhale 1 puff into the lungs 2 (two) times daily. 1 Inhaler 5  . albuterol (PROVENTIL HFA;VENTOLIN HFA) 108 (90 BASE) MCG/ACT inhaler Inhale 1-2 puffs into the lungs every 4 (four) hours as needed for wheezing or shortness of breath. 1 Inhaler 0       TMB:PJPET from the symptoms mentioned above,there are no other symptoms referable to all systems reviewed.  . potassium chloride  40 mEq Oral BID  . sodium chloride flush  3 mL Intravenous Q12H       Physical Exam: Vital signs in last 24 hours: Temp:  [98.4 F (36.9 C)-98.7 F (37.1 C)] 98.4 F (36.9 C) (09/05 0541) Pulse Rate:  [71-127] 74 (09/05 0541) Resp:  [16-20] 16 (09/05 0541) BP: (80-121)/(62-107) 108/62 (09/05 0541) SpO2:  [93 %-99 %] 95 % (09/05 0541) Weight:  [116 lb 4.8 oz (52.8 kg)-122 lb (55.3 kg)] 116 lb 4.8 oz (52.8 kg) (09/04 2329) Weight change:  Last BM Date: 05/16/16  Intake/Output from previous day: 09/04 0701 - 09/05 0700 In: 625.4 [P.O.:240; I.V.:385.4] Out: -  No intake/output data recorded.   Physical Exam: General- pt is awake,alert, oriented to time place and person Resp- No acute REsp distress, CTA B/L  NO Rhonchi CVS- S1S2 regular in rate and rhythm GIT- BS+, soft, NT, ND EXT- NO LE Edema, Cyanosis CNS- CN 2-12 grossly intact. Moving all 4 extremities Psych- normal mood and affect   Lab Results: CBC  Recent Labs  05/16/16 1742 05/17/16 0604  WBC 9.2 7.8  HGB 17.0 12.9*  HCT 47.0 36.8*  PLT 302 238    BMET  Recent Labs  05/16/16 2148 05/17/16 0604  NA 129* 134*  K 3.8 3.3*  CL 94* 103  CO2 21* 23  GLUCOSE 104* 91  BUN 23* 22*  CREATININE 3.14* 2.40*  CALCIUM 8.7* 7.9*   Creat trend 2017   3.14=>2.4    MICRO No results found for this or any previous visit (from the past 240 hour(s)).    Lab Results  Component Value Date   CALCIUM 7.9 (L) 05/17/2016   CAION 1.05 (L)  01/11/2009   PHOS 3.0 04/14/2011      Impression: 1)Renal  AKI secondary to Hypovolemia/Hypotension/RAAS blockers                AKI sec to Prerenal/ATN                AKI now better                Creat trending down  2)CVS- Hypotensive earlier     Now better  3)Anemia HGb lower ( high sec to hemoconcentration?)  4)Hyponatremia- Hypovolemic Hyponatremia   Sodium better   5)Hypokalemia   Sec to GI losses   Being replte  6) GI- admitted with nausea     Clinically better  7)Acid base Co2 at goal Was low earlier    Plan:  Agree with holding ACE Agree with IVF Will ask for Renal U/s Will ask for FENA Will follow bmet     [place] S 05/17/2016, 9:09 AM

## 2016-05-18 LAB — BASIC METABOLIC PANEL
ANION GAP: 4 — AB (ref 5–15)
BUN: 12 mg/dL (ref 6–20)
CHLORIDE: 111 mmol/L (ref 101–111)
CO2: 24 mmol/L (ref 22–32)
Calcium: 9 mg/dL (ref 8.9–10.3)
Creatinine, Ser: 1.26 mg/dL — ABNORMAL HIGH (ref 0.61–1.24)
GFR calc Af Amer: >60 mL/min (ref >60)
Glucose, Bld: 92 mg/dL (ref 65–99)
POTASSIUM: 5.2 mmol/L — AB (ref 3.5–5.1)
SODIUM: 139 mmol/L (ref 135–145)

## 2016-05-18 LAB — CBC
HEMATOCRIT: 38.6 % — AB (ref 39.0–52.0)
HEMOGLOBIN: 13 g/dL (ref 13.0–17.0)
MCH: 29.3 pg (ref 26.0–34.0)
MCHC: 33.7 g/dL (ref 30.0–36.0)
MCV: 87.1 fL (ref 78.0–100.0)
Platelets: 223 10*3/uL (ref 150–400)
RBC: 4.43 MIL/uL (ref 4.22–5.81)
RDW: 13.8 % (ref 11.5–15.5)
WBC: 6.6 10*3/uL (ref 4.0–10.5)

## 2016-05-18 MED ORDER — ALUM & MAG HYDROXIDE-SIMETH 200-200-20 MG/5ML PO SUSP
30.0000 mL | ORAL | Status: DC | PRN
  Administered 2016-05-18: 30 mL via ORAL
  Filled 2016-05-18: qty 30

## 2016-05-18 NOTE — Care Management Note (Signed)
Case Management Note  Patient Details  Name: Bryan [name] MRN: [id number] Date of Birth: [birth year]  Subjective/Objective:  Patient is from home, son lives with him. He is ind with ADL's. Dr. Legrand Rams is his PCP. He has medicare, reports no issues.              Action/Plan: Anticipate DC home with self care.   Expected Discharge Date:  05/17/16               Expected Discharge Plan:  Home/Self Care  In-House Referral:  NA  Discharge planning Services  CM Consult  Post Acute Care Choice:  NA Choice offered to:  NA  DME Arranged:    DME Agency:     HH Arranged:    HH Agency:     Status of Service:  Completed, signed off  If discussed at [employer] of Stay Meetings, dates discussed:    Additional Comments:  Shainna Faux, [name], RN 05/18/2016, 1:19 PM

## 2016-05-18 NOTE — Care Management Important Message (Signed)
Important Message  Patient Details  Name: Bryan [name] MRN: [id number] Date of Birth: 17-Jan-1956   Medicare Important Message Given:  Yes    Damond Borchers, [name], RN 05/18/2016, 1:00 PM

## 2016-05-18 NOTE — Progress Notes (Signed)
Subjective: Patient has 2-3 water diarrhea daily. His nausea and vomiting better. He want to try regular diet. His renal function is improving. Objective: Vital signs in last 24 hours: Temp:  [97.5 F (36.4 C)-98.8 F (37.1 C)] 97.5 F (36.4 C) (09/06 0539) Pulse Rate:  [65-80] 74 (09/06 0539) Resp:  [18-20] 20 (09/05 2007) BP: (112-141)/(66-77) 141/77 (09/06 0539) SpO2:  [96 %-98 %] 96 % (09/06 0539) Weight:  [55.1 kg (121 lb 8 oz)] 55.1 kg (121 lb 8 oz) (09/06 0539) Weight change: -0.227 kg (-8 oz) Last BM Date: 05/16/16  Intake/Output from previous day: 09/05 0701 - 09/06 0700 In: 840 [P.O.:840] Out: 1300 [Urine:1300]  PHYSICAL EXAM General appearance: alert and no distress Resp: clear to auscultation bilaterally Cardio: S1, S2 normal GI: soft, non-tender; bowel sounds normal; no masses,  no organomegaly Extremities: extremities normal, atraumatic, no cyanosis or edema  Lab Results:  Results for orders placed or performed during the hospital encounter of 05/16/16 (from the past 48 hour(s))  Urinalysis, Routine w reflex microscopic     Status: Abnormal   Collection Time: 05/16/16  4:13 PM  Result Value Ref Range   Color, Urine YELLOW YELLOW   APPearance CLEAR CLEAR   Specific Gravity, Urine >1.030 (H) 1.005 - 1.030   pH 5.0 5.0 - 8.0   Glucose, UA NEGATIVE NEGATIVE mg/dL   Hgb urine dipstick TRACE (A) NEGATIVE   Bilirubin Urine MODERATE (A) NEGATIVE   Ketones, ur 15 (A) NEGATIVE mg/dL   Protein, ur 100 (A) NEGATIVE mg/dL   Nitrite NEGATIVE NEGATIVE   Leukocytes, UA NEGATIVE NEGATIVE  Urine microscopic-add on     Status: Abnormal   Collection Time: 05/16/16  4:13 PM  Result Value Ref Range   Squamous Epithelial / LPF 0-5 (A) NONE SEEN   WBC, UA 0-5 0 - 5 WBC/hpf   RBC / HPF 0-5 0 - 5 RBC/hpf   Bacteria, UA MANY (A) NONE SEEN   Casts HYALINE CASTS (A) NEGATIVE   Crystals CA OXALATE CRYSTALS (A) NEGATIVE  Lipase, blood     Status: None   Collection Time: 05/16/16   5:42 PM  Result Value Ref Range   Lipase 50 11 - 51 U/L  Comprehensive metabolic panel     Status: Abnormal   Collection Time: 05/16/16  5:42 PM  Result Value Ref Range   Sodium 132 (L) 135 - 145 mmol/L   Potassium 3.6 3.5 - 5.1 mmol/L   Chloride 87 (L) 101 - 111 mmol/L   CO2 27 22 - 32 mmol/L   Glucose, Bld 99 65 - 99 mg/dL   BUN 21 (H) 6 - 20 mg/dL   Creatinine, Ser 2.93 (H) 0.61 - 1.24 mg/dL   Calcium 10.2 8.9 - 10.3 mg/dL   Total Protein 9.1 (H) 6.5 - 8.1 g/dL   Albumin 4.9 3.5 - 5.0 g/dL   AST 26 15 - 41 U/L   ALT 17 17 - 63 U/L   Alkaline Phosphatase 123 38 - 126 U/L   Total Bilirubin 0.8 0.3 - 1.2 mg/dL   GFR calc non Af Amer 22 (L) >60 mL/min   GFR calc Af Amer 25 (L) >60 mL/min    Comment: (NOTE) The eGFR has been calculated using the CKD EPI equation. This calculation has not been validated in all clinical situations. eGFR's persistently <60 mL/min signify possible Chronic Kidney Disease.    Anion gap 18 (H) 5 - 15  CBC     Status: Abnormal  Collection Time: 05/16/16  5:42 PM  Result Value Ref Range   WBC 9.2 4.0 - 10.5 K/uL   RBC 5.56 4.22 - 5.81 MIL/uL   Hemoglobin 17.0 13.0 - 17.0 g/dL   HCT 47.0 39.0 - 52.0 %   MCV 84.5 78.0 - 100.0 fL   MCH 30.6 26.0 - 34.0 pg   MCHC 36.2 (H) 30.0 - 36.0 g/dL   RDW 13.3 11.5 - 15.5 %   Platelets 302 150 - 400 K/uL  Basic metabolic panel     Status: Abnormal   Collection Time: 05/16/16  9:48 PM  Result Value Ref Range   Sodium 129 (L) 135 - 145 mmol/L   Potassium 3.8 3.5 - 5.1 mmol/L   Chloride 94 (L) 101 - 111 mmol/L   CO2 21 (L) 22 - 32 mmol/L   Glucose, Bld 104 (H) 65 - 99 mg/dL   BUN 23 (H) 6 - 20 mg/dL   Creatinine, Ser 3.14 (H) 0.61 - 1.24 mg/dL   Calcium 8.7 (L) 8.9 - 10.3 mg/dL   GFR calc non Af Amer 20 (L) >60 mL/min   GFR calc Af Amer 23 (L) >60 mL/min    Comment: (NOTE) The eGFR has been calculated using the CKD EPI equation. This calculation has not been validated in all clinical situations. eGFR's  persistently <60 mL/min signify possible Chronic Kidney Disease.    Anion gap 14 5 - 15  I-Stat CG4 Lactic Acid, ED     Status: None   Collection Time: 05/16/16 10:58 PM  Result Value Ref Range   Lactic Acid, Venous 1.12 0.5 - 1.9 mmol/L  CBC     Status: Abnormal   Collection Time: 05/17/16  6:04 AM  Result Value Ref Range   WBC 7.8 4.0 - 10.5 K/uL   RBC 4.29 4.22 - 5.81 MIL/uL   Hemoglobin 12.9 (L) 13.0 - 17.0 g/dL    Comment: DELTA CHECK NOTED   HCT 36.8 (L) 39.0 - 52.0 %   MCV 85.8 78.0 - 100.0 fL   MCH 29.8 26.0 - 34.0 pg   MCHC 34.8 30.0 - 36.0 g/dL   RDW 13.5 11.5 - 15.5 %   Platelets 238 150 - 400 K/uL  Comprehensive metabolic panel     Status: Abnormal   Collection Time: 05/17/16  6:04 AM  Result Value Ref Range   Sodium 134 (L) 135 - 145 mmol/L   Potassium 3.3 (L) 3.5 - 5.1 mmol/L   Chloride 103 101 - 111 mmol/L   CO2 23 22 - 32 mmol/L   Glucose, Bld 91 65 - 99 mg/dL   BUN 22 (H) 6 - 20 mg/dL   Creatinine, Ser 2.40 (H) 0.61 - 1.24 mg/dL   Calcium 7.9 (L) 8.9 - 10.3 mg/dL   Total Protein 5.9 (L) 6.5 - 8.1 g/dL   Albumin 3.3 (L) 3.5 - 5.0 g/dL   AST 28 15 - 41 U/L   ALT 13 (L) 17 - 63 U/L   Alkaline Phosphatase 86 38 - 126 U/L   Total Bilirubin 0.7 0.3 - 1.2 mg/dL   GFR calc non Af Amer 28 (L) >60 mL/min   GFR calc Af Amer 32 (L) >60 mL/min    Comment: (NOTE) The eGFR has been calculated using the CKD EPI equation. This calculation has not been validated in all clinical situations. eGFR's persistently <60 mL/min signify possible Chronic Kidney Disease.    Anion gap 8 5 - 15  Basic metabolic panel  Status: Abnormal   Collection Time: 05/18/16  6:08 AM  Result Value Ref Range   Sodium 139 135 - 145 mmol/L   Potassium 5.2 (H) 3.5 - 5.1 mmol/L    Comment: DELTA CHECK NOTED   Chloride 111 101 - 111 mmol/L   CO2 24 22 - 32 mmol/L   Glucose, Bld 92 65 - 99 mg/dL   BUN 12 6 - 20 mg/dL   Creatinine, Ser 1.26 (H) 0.61 - 1.24 mg/dL   Calcium 9.0 8.9 - 10.3  mg/dL   GFR calc non Af Amer >60 >60 mL/min   GFR calc Af Amer >60 >60 mL/min    Comment: (NOTE) The eGFR has been calculated using the CKD EPI equation. This calculation has not been validated in all clinical situations. eGFR's persistently <60 mL/min signify possible Chronic Kidney Disease.    Anion gap 4 (L) 5 - 15  CBC     Status: Abnormal   Collection Time: 05/18/16  6:08 AM  Result Value Ref Range   WBC 6.6 4.0 - 10.5 K/uL   RBC 4.43 4.22 - 5.81 MIL/uL   Hemoglobin 13.0 13.0 - 17.0 g/dL   HCT 38.6 (L) 39.0 - 52.0 %   MCV 87.1 78.0 - 100.0 fL   MCH 29.3 26.0 - 34.0 pg   MCHC 33.7 30.0 - 36.0 g/dL   RDW 13.8 11.5 - 15.5 %   Platelets 223 150 - 400 K/uL    ABGS No results for input(s): PHART, PO2ART, TCO2, HCO3 in the last 72 hours.  Invalid input(s): PCO2 CULTURES No results found for this or any previous visit (from the past 240 hour(s)). Studies/Results: Ct Abdomen Pelvis Wo Contrast  Result Date: 05/16/2016 CLINICAL DATA:  Initial evaluation for acute generalized abdominal pain with nausea and vomiting. EXAM: CT ABDOMEN AND PELVIS WITHOUT CONTRAST TECHNIQUE: Multidetector CT imaging of the abdomen and pelvis was performed following the standard protocol without IV contrast. COMPARISON:  Prior CT from 03/11/2014. FINDINGS: Mild hazy atelectatic changes present within the partially visualized lingula. Partially visualized lung bases are otherwise clear. Limited noncontrast evaluation of the liver is unremarkable. Focal fat deposition noted adjacent to the falciform ligament. Gallbladder within normal limits. No biliary dilatation. Spleen, adrenal glands, and pancreas demonstrate a normal unenhanced appearance. Kidneys equal in size without evidence of nephrolithiasis or hydronephrosis. No radiopaque calculi seen along the course of either renal collect system. There is no hydroureter. Stomach within normal limits. No evidence for bowel obstruction. Appendix normal. No abnormal  wall thickening or inflammatory fat stranding seen about the bowels. Bladder decompressed without acute abnormality. Prostate within normal limits. No free air or fluid. No adenopathy. Mild aorto bi-iliac atherosclerotic disease. No aneurysm. Postsurgical changes noted within the lumbar spine. No acute osseous abnormality. No worrisome lytic or blastic osseous lesions. IMPRESSION: No CT evidence for acute intra-abdominal or pelvic process. Electronically Signed   By: Jeannine Boga M.D.   On: 05/16/2016 21:36   Dg Chest 2 View  Result Date: 05/16/2016 CLINICAL DATA:  60 year old male with abdominal pain, nausea vomiting and diarrhea. EXAM: CHEST  2 VIEW COMPARISON:  Chest radiograph dated 12/01/2015 FINDINGS: The lungs are clear. There is no pleural effusion or pneumothorax. The cardiac silhouette is within normal limits. No acute osseous pathology identified. Lower cervical spine fixation plate and screws. IMPRESSION: No active cardiopulmonary disease. Electronically Signed   By: Anner Crete M.D.   On: 05/16/2016 20:33   US Renal  Result Date: 05/17/2016 CLINICAL DATA:  Acute kidney injury. EXAM: RENAL / URINARY TRACT ULTRASOUND COMPLETE COMPARISON:  CT of the abdomen and pelvis 05/16/2016. FINDINGS: Right Kidney: Length: 9.8 cm, within normal limits. Echogenicity within normal limits. No mass or hydronephrosis visualized. Left Kidney: Length: 9.1 cm, within normal limits. Echogenicity within normal limits. No mass or hydronephrosis visualized. Bladder: Appears normal for degree of bladder distention. IMPRESSION: Negative renal ultrasound. Electronically Signed   By: San Morelle M.D.   On: 05/17/2016 08:41    Medications: I have reviewed the patient's current medications.  Assesment:  Principal Problem:   Acute renal failure (ARF) ([facility]) Active Problems:   GERD   DUODENAL ULCER, HX OF   COPD, severe (H[facility])   Nausea with vomiting   Hypertension   Chronic pain    Dehydration    Plan:  Medications reviewed Will adjust iv fluid Nephrology consult appreciated Repeat BMP in AM    LOS: 2 days   Amanie Mcculley 05/18/2016, 8:19 AM

## 2016-05-18 NOTE — Progress Notes (Signed)
ASBURY HAIR  MRN: [id number]  DOB/AGE: 1955/10/03 60 y.o.  Primary Care Physician:FANTA,TESFAYE, MD  Admit date: 05/16/2016  Chief Complaint:  Chief Complaint  Patient presents with  . Abdominal Pain  . Emesis    S-Pt presented on  05/16/2016 with  Chief Complaint  Patient presents with  . Abdominal Pain  . Emesis  .    Pt today feels better  Meds  . feeding supplement (ENSURE ENLIVE)  237 mL Oral BID BM  . sodium chloride flush  3 mL Intravenous Q12H     Physical Exam: Vital signs in last 24 hours: Temp:  [97.5 F (36.4 C)-98.8 F (37.1 C)] 97.5 F (36.4 C) (09/06 0539) Pulse Rate:  [65-80] 74 (09/06 0539) Resp:  [18-20] 20 (09/05 2007) BP: (112-141)/(66-77) 141/77 (09/06 0539) SpO2:  [96 %-98 %] 96 % (09/06 0539) Weight:  [121 lb 8 oz (55.1 kg)] 121 lb 8 oz (55.1 kg) (09/06 0539) Weight change: -8 oz (-0.227 kg) Last BM Date: 05/16/16  Intake/Output from previous day: 09/05 0701 - 09/06 0700 In: 840 [P.O.:840] Out: 1300 [Urine:1300] No intake/output data recorded.   Physical Exam: General- pt is awake,alert, oriented to time place and person Resp- No acute REsp distress, CTA B/L NO Rhonchi CVS- S1S2 regular in rate and rhythm GIT- BS+, soft, NT, ND EXT- NO LE Edema, Cyanosis   Lab Results: CBC  Recent Labs  05/17/16 0604 05/18/16 0608  WBC 7.8 6.6  HGB 12.9* 13.0  HCT 36.8* 38.6*  PLT 238 223    BMET  Recent Labs  05/17/16 0604 05/18/16 0608  NA 134* 139  K 3.3* 5.2*  CL 103 111  CO2 23 24  GLUCOSE 91 92  BUN 22* 12  CREATININE 2.40* 1.26*  CALCIUM 7.9* 9.0   Creat trend 2017  3.14=>2.4=>1.26  MICRO No results found for this or any previous visit (from the past 240 hour(s)).    Lab Results  Component Value Date   CALCIUM 9.0 05/18/2016   CAION 1.05 (L) 01/11/2009   PHOS 3.0 04/14/2011               Impression: 1)Renal  AKI secondary to Hypovolemia/Hypotension/RAAS blockers                AKI sec to  Prerenal/ATN                AKI now better                Creat trending down  2)CVS- Hypotensive earlier     Now better  3)Anemia HGb lower ( high sec to hemoconcentration?)  4)Hyponatremia- Hypovolemic Hyponatremia   Sodium now better   5)Hypokalemia   Sec to GI losses  now hyperkalemia sec to Iatrogenic  6) GI- admitted with nausea     Clinically better  7)Acid base Co2 now at goal    Plan:  Will continue current care     [place] S 05/18/2016, 8:48 AM

## 2016-05-19 NOTE — Progress Notes (Signed)
IV discontinued. Pt. Discharged home with instructions.

## 2016-05-19 NOTE — Discharge Summary (Signed)
Physician Discharge Summary  Patient ID: Bryan [name] MRN: [id number] DOB/AGE: October 07, 1955 60 y.o. Primary [place], MD Admit date: 05/16/2016 Discharge date: 05/19/2016    Discharge Diagnoses:    Principal Problem:   Acute renal failure (ARF) ([place]) Active Problems:   GERD   DUODENAL ULCER, HX OF   COPD, severe (HCC)   Nausea with vomiting   Hypertension   Chronic pain   Dehydration     Medication List    TAKE these medications   albuterol 108 (90 Base) MCG/ACT inhaler Commonly known as:  PROVENTIL HFA;VENTOLIN HFA Inhale 1-2 puffs into the lungs every 4 (four) hours as needed for wheezing or shortness of breath.   cyclobenzaprine 5 MG tablet Commonly known as:  FLEXERIL Take 1 tablet (5 mg total) by mouth 3 (three) times daily as needed (muscle soreness).   lisinopril 20 MG tablet Commonly known as:  PRINIVIL,ZESTRIL Take 20 mg by mouth daily.   NEXIUM 40 MG capsule Generic drug:  esomeprazole TAKE 1 CAPSULE BY MOUTH TWICE DAILY BEFORE A MEAL   SYMBICORT 160-4.5 MCG/ACT inhaler Generic drug:  budesonide-formoterol Inhale 1 puff into the lungs 2 (two) times daily.       Discharged Condition: improved    Consults: nephrology  Significant Diagnostic Studies: Ct Abdomen Pelvis Wo Contrast  Result Date: 05/16/2016 CLINICAL DATA:  Initial evaluation for acute generalized abdominal pain with nausea and vomiting. EXAM: CT ABDOMEN AND PELVIS WITHOUT CONTRAST TECHNIQUE: Multidetector CT imaging of the abdomen and pelvis was performed following the standard protocol without IV contrast. COMPARISON:  Prior CT from 03/11/2014. FINDINGS: Mild hazy atelectatic changes present within the partially visualized lingula. Partially visualized lung bases are otherwise clear. Limited noncontrast evaluation of the liver is unremarkable. Focal fat deposition noted adjacent to the falciform ligament. Gallbladder within normal limits. No biliary dilatation. Spleen,  adrenal glands, and pancreas demonstrate a normal unenhanced appearance. Kidneys equal in size without evidence of nephrolithiasis or hydronephrosis. No radiopaque calculi seen along the course of either renal collect system. There is no hydroureter. Stomach within normal limits. No evidence for bowel obstruction. Appendix normal. No abnormal wall thickening or inflammatory fat stranding seen about the bowels. Bladder decompressed without acute abnormality. Prostate within normal limits. No free air or fluid. No adenopathy. Mild aorto bi-iliac atherosclerotic disease. No aneurysm. Postsurgical changes noted within the lumbar spine. No acute osseous abnormality. No worrisome lytic or blastic osseous lesions. IMPRESSION: No CT evidence for acute intra-abdominal or pelvic process. Electronically Signed   By: Jeannine Boga M.D.   On: 05/16/2016 21:36   Dg Chest 2 View  Result Date: 05/16/2016 CLINICAL DATA:  60 year old male with abdominal pain, nausea vomiting and diarrhea. EXAM: CHEST  2 VIEW COMPARISON:  Chest radiograph dated 12/01/2015 FINDINGS: The lungs are clear. There is no pleural effusion or pneumothorax. The cardiac silhouette is within normal limits. No acute osseous pathology identified. Lower cervical spine fixation plate and screws. IMPRESSION: No active cardiopulmonary disease. Electronically Signed   By: Anner Crete M.D.   On: 05/16/2016 20:33   US Renal  Result Date: 05/17/2016 CLINICAL DATA:  Acute kidney injury. EXAM: RENAL / URINARY TRACT ULTRASOUND COMPLETE COMPARISON:  CT of the abdomen and pelvis 05/16/2016. FINDINGS: Right Kidney: Length: 9.8 cm, within normal limits. Echogenicity within normal limits. No mass or hydronephrosis visualized. Left Kidney: Length: 9.1 cm, within normal limits. Echogenicity within normal limits. No mass or hydronephrosis visualized. Bladder: Appears normal for degree of bladder distention. IMPRESSION: Negative renal  ultrasound. Electronically  Signed   By: San Morelle M.D.   On: 05/17/2016 08:41    Lab Results: Basic Metabolic Panel:  Recent Labs  05/17/16 0604 05/18/16 0608  NA 134* 139  K 3.3* 5.2*  CL 103 111  CO2 23 24  GLUCOSE 91 92  BUN 22* 12  CREATININE 2.40* 1.26*  CALCIUM 7.9* 9.0   Liver Function Tests:  Recent Labs  05/16/16 1742 05/17/16 0604  AST 26 28  ALT 17 13*  ALKPHOS 123 86  BILITOT 0.8 0.7  PROT 9.1* 5.9*  ALBUMIN 4.9 3.3*     CBC:  Recent Labs  05/17/16 0604 05/18/16 0608  WBC 7.8 6.6  HGB 12.9* 13.0  HCT 36.8* 38.6*  MCV 85.8 87.1  PLT 238 223    No results found for this or any previous visit (from the past 240 hour(s)).   Hospital Course:   This is a 60 years old male with history of hypertension and COPD was admitted due to acute renal failure following nausea,.vomiting and diarrhea causing dehydration. Patient was admitted and he rehydrated. His GI symptoms gradually resolved. Nephrology consult was done who assisted on his treatment. His renal function is back to base line. He is being discharged in stable condition to continue his regular treatment.  Discharge Exam: Blood pressure 131/83, pulse 89, temperature 98.3 F (36.8 C), temperature source Oral, resp. rate 18, height _0  (1.6 m), weight 55.4 kg (122 lb 1.6 oz), SpO2 95 %.    Disposition:  home    Follow-up Information    Berdine Rasmusson, MD Follow up in 1 week(s).   Specialty:  Internal Medicine Contact information: [place] [address] [phone]           Signed: Rosita Fire   05/19/2016, 8:26 AM

## 2016-05-19 NOTE — Progress Notes (Signed)
Subjective: Interval History: has no complaint of some diarrhea still but much better. Denies any nausea or vomiting.  Objective: Vital signs in last 24 hours: Temp:  [98.1 F (36.7 C)-98.3 F (36.8 C)] 98.3 F (36.8 C) (09/07 0527) Pulse Rate:  [70-89] 89 (09/07 0527) Resp:  [18] 18 (09/06 2222) BP: (130-138)/(68-83) 131/83 (09/07 0527) SpO2:  [95 %-96 %] 95 % (09/07 0527) Weight:  [55.4 kg (122 lb 1.6 oz)] 55.4 kg (122 lb 1.6 oz) (09/07 0527) Weight change: 0.272 kg (9.6 oz)  Intake/Output from previous day: 09/06 0701 - 09/07 0700 In: 720 [P.O.:720] Out: 950 [Urine:950] Intake/Output this shift: No intake/output data recorded.  Generally patient is alert not in distress. Chest is clear to auscultation Heart exam regular rate and rhythm no murmur Extremities no edema   Lab Results:  Recent Labs  05/17/16 0604 05/18/16 0608  WBC 7.8 6.6  HGB 12.9* 13.0  HCT 36.8* 38.6*  PLT 238 223   BMET:  Recent Labs  05/17/16 0604 05/18/16 0608  NA 134* 139  K 3.3* 5.2*  CL 103 111  CO2 23 24  GLUCOSE 91 92  BUN 22* 12  CREATININE 2.40* 1.26*  CALCIUM 7.9* 9.0   No results for input(s): PTH in the last 72 hours. Iron Studies: No results for input(s): IRON, TIBC, TRANSFERRIN, FERRITIN in the last 72 hours.  Studies/Results: No results found.  I have reviewed the patient's current medications.  Assessment/Plan: Problem #1 acute kidney injury: Slightly secondary to prerenal syndrome. Presently his creatinine 1.26 his renal function has improved. Problem #2 hypokalemia: His potassium has corrected. Problem #3 hypertension: His blood pressure is reasonably controlled Problem #4 history of nausea, vomiting and diarrhea has improved. Problem #5 history of COPD : Patient presently on inhaler and asymptomatic Problem #6 history of GERD Plan: We'll DC IV fluid We'll encourage patient to increase his fluid intake Since his renal function has recovered I will sign  Off.   thank you   LOS: 3 days   Delenn Ahn S 05/19/2016,9:06 AM

## 2016-05-26 DIAGNOSIS — J449 Chronic obstructive pulmonary disease, unspecified: Secondary | ICD-10-CM | POA: Diagnosis not present

## 2016-05-26 DIAGNOSIS — I10 Essential (primary) hypertension: Secondary | ICD-10-CM | POA: Diagnosis not present

## 2016-05-26 DIAGNOSIS — N179 Acute kidney failure, unspecified: Secondary | ICD-10-CM | POA: Diagnosis not present

## 2016-05-26 DIAGNOSIS — K219 Gastro-esophageal reflux disease without esophagitis: Secondary | ICD-10-CM | POA: Diagnosis not present

## 2016-05-26 DIAGNOSIS — C61 Malignant neoplasm of prostate: Secondary | ICD-10-CM | POA: Diagnosis not present

## 2016-06-07 ENCOUNTER — Encounter (HOSPITAL_COMMUNITY): Payer: Self-pay | Admitting: Emergency Medicine

## 2016-06-07 ENCOUNTER — Emergency Department (HOSPITAL_COMMUNITY)
Admission: EM | Admit: 2016-06-07 | Discharge: 2016-06-07 | Disposition: A | Discharge status: Home / Self Care | Payer: Medicare Other | Attending: Emergency Medicine | Admitting: Emergency Medicine

## 2016-06-07 DIAGNOSIS — I10 Essential (primary) hypertension: Secondary | ICD-10-CM | POA: Insufficient documentation

## 2016-06-07 DIAGNOSIS — R197 Diarrhea, unspecified: Secondary | ICD-10-CM | POA: Insufficient documentation

## 2016-06-07 DIAGNOSIS — R112 Nausea with vomiting, unspecified: Secondary | ICD-10-CM | POA: Insufficient documentation

## 2016-06-07 DIAGNOSIS — J449 Chronic obstructive pulmonary disease, unspecified: Secondary | ICD-10-CM | POA: Insufficient documentation

## 2016-06-07 DIAGNOSIS — Z87891 Personal history of nicotine dependence: Secondary | ICD-10-CM | POA: Insufficient documentation

## 2016-06-07 DIAGNOSIS — Z79899 Other long term (current) drug therapy: Secondary | ICD-10-CM | POA: Diagnosis not present

## 2016-06-07 DIAGNOSIS — J45909 Unspecified asthma, uncomplicated: Secondary | ICD-10-CM | POA: Diagnosis not present

## 2016-06-07 LAB — COMPREHENSIVE METABOLIC PANEL
ALBUMIN: 4.8 g/dL (ref 3.5–5.0)
ALK PHOS: 115 U/L (ref 38–126)
ALT: 18 U/L (ref 17–63)
ANION GAP: 9 (ref 5–15)
AST: 26 U/L (ref 15–41)
BILIRUBIN TOTAL: 0.4 mg/dL (ref 0.3–1.2)
BUN: 13 mg/dL (ref 6–20)
CHLORIDE: 102 mmol/L (ref 101–111)
CO2: 20 mmol/L — AB (ref 22–32)
Calcium: 9.9 mg/dL (ref 8.9–10.3)
Creatinine, Ser: 1.63 mg/dL — ABNORMAL HIGH (ref 0.61–1.24)
GFR calc Af Amer: 51 mL/min — ABNORMAL LOW (ref >60)
GFR calc non Af Amer: 44 mL/min — ABNORMAL LOW (ref >60)
GLUCOSE: 116 mg/dL — AB (ref 65–99)
POTASSIUM: 4.5 mmol/L (ref 3.5–5.1)
Sodium: 131 mmol/L — ABNORMAL LOW (ref 135–145)
Total Protein: 9 g/dL — ABNORMAL HIGH (ref 6.5–8.1)

## 2016-06-07 LAB — CBC
HEMATOCRIT: 45.4 % (ref 39.0–52.0)
HEMOGLOBIN: 16 g/dL (ref 13.0–17.0)
MCH: 30.4 pg (ref 26.0–34.0)
MCHC: 35.2 g/dL (ref 30.0–36.0)
MCV: 86.1 fL (ref 78.0–100.0)
Platelets: 355 10*3/uL (ref 150–400)
RBC: 5.27 MIL/uL (ref 4.22–5.81)
RDW: 14.2 % (ref 11.5–15.5)
WBC: 6.6 10*3/uL (ref 4.0–10.5)

## 2016-06-07 MED ORDER — ONDANSETRON HCL 4 MG PO TABS
4.0000 mg | ORAL_TABLET | Freq: Once | ORAL | Status: AC
  Administered 2016-06-07: 4 mg via ORAL
  Filled 2016-06-07: qty 1

## 2016-06-07 MED ORDER — SODIUM CHLORIDE 0.9 % IV SOLN
1000.0000 mL | Freq: Once | INTRAVENOUS | Status: AC
  Administered 2016-06-07: 1000 mL via INTRAVENOUS

## 2016-06-07 MED ORDER — ONDANSETRON HCL 4 MG PO TABS: 4.0000 mg | ORAL_TABLET | Freq: Three times a day (TID) | ORAL | 0 refills | Status: DC | PRN

## 2016-06-07 NOTE — ED Triage Notes (Signed)
Pt reports n/v/d since 3am this morning with muscle cramps.  Pt alert and oriented at this time.

## 2016-06-07 NOTE — Discharge Instructions (Signed)
You were seen for nausea in the ED. You will be given medicine for nausea to take at home. Please follow with your primary care provider for nausea. If you have worsening symptoms, return to the ED for evaluation

## 2016-06-07 NOTE — ED Notes (Signed)
Pt tolerating PO fluids. No vomiting.

## 2016-06-07 NOTE — ED Provider Notes (Signed)
[facility] DEPT Provider Note   CSN: [id number] Arrival date & time: 06/07/16  1046     History   Chief Complaint Chief Complaint  Patient presents with  . Diarrhea   HPI   Bryan [name] is a 60 y.o. Male presents for nausea, emesis, and diarrhea. He was recently admitted for dehydration secondary to emesis and diarrhea. He states he had diarrhea 6 times this AM, with his last episode at 6 AM today. He has had nausea and vomitting with 3 episodes of emesis. He has been able to tolerate water. His emesis was did not have bright red blood or have a dark " coffee ground" appearance. He reports abdominal cramping this AM but none at this time. He states that he still feels nauseous at this time.  He additionally reports whole body numbness and lower extremity cramping for over 6 months. Otherwise he does not report shortness of breath, chest pain, fever  Past Medical History:  Diagnosis Date  . Acid reflux   . Asthma   . Chronic pain    with leg and back pain (disc problem)  . COPD (chronic obstructive pulmonary disease) ([place])   . History of upper GI x-ray series    to follow showed large duodenal ulcer H pylori serologies were negative  . Hypertension   . Tubular adenoma     Patient Active Problem List   Diagnosis Date Noted  . Acute renal failure (ARF) ([place]) 05/16/2016  . Nausea with vomiting 05/16/2016  . Dehydration 05/16/2016  . Hypertension   . Chronic pain   . Heme positive stool 10/20/2014  . Abdominal pain, epigastric 06/19/2014  . Esophageal dysphagia 06/19/2014  . AP (abdominal pain) 11/13/2013  . Early satiety 11/13/2013  . COPD, severe ([place]) 02/22/2013  . Low back pain 11/19/2012  . COLITIS 05/12/2009  . ABDOMINAL PAIN, LEFT LOWER QUADRANT 05/12/2009  . DUODENAL ULCER, HX OF 05/12/2009  . ALCOHOL USE 05/11/2009  . GERD 05/11/2009  . Diarrhea 05/11/2009  . SPONDYLOSIS, CERVICAL 07/03/2007  . NECK PAIN 07/03/2007    Past Surgical History:  Procedure  Laterality Date  . BACK SURGERY  7/05; 5/09    Dr.Hirsch,3 lumbar  . BACK SURGERY    . COLONOSCOPY  Feb 2012   Dr. Gala Romney: normal rectum, pedunculate polyp removed but not recovered  . COLONOSCOPY WITH ESOPHAGOGASTRODUODENOSCOPY (EGD) N/A 11/18/2013   Dr.Rourk- tcs= normal rectum, multipe polyps about the ileocecal valve and distal transverse colon o/w the remainder of the colonic mucosa appeared normal bx= tubular adenoma. EGD= normal esophagus, stomach with scattered erosions mottling, friablility, no ulcer or infiltrating process patent pylorus bx= chronic inflammation. next TCS 11/2016  . ESOPHAGOGASTRODUODENOSCOPY  1/06   Dr. Volney American esophageal erosions,U-shaped stomach,marked erosions and edema of the bulb without discrete ulcer disease.   Marland Kitchen NECK SURGERY  2/09       Home Medications    Prior to Admission medications   Medication Sig Start Date End Date Taking? Authorizing Provider  albuterol (PROVENTIL HFA;VENTOLIN HFA) 108 (90 BASE) MCG/ACT inhaler Inhale 1-2 puffs into the lungs every 4 (four) hours as needed for wheezing or shortness of breath. 07/08/15  Yes Hollace Kinnier Sofia, PA-C  cyclobenzaprine (FLEXERIL) 5 MG tablet Take 1 tablet (5 mg total) by mouth 3 (three) times daily as needed (muscle soreness). 12/02/15  Yes Rolland Porter, MD  lisinopril (PRINIVIL,ZESTRIL) 20 MG tablet Take 20 mg by mouth daily.   Yes Historical Provider, MD  NEXIUM 40 MG capsule TAKE  1 CAPSULE BY MOUTH TWICE DAILY BEFORE A MEAL 05/19/16  Yes [name], NP  SYMBICORT 160-4.5 MCG/ACT inhaler Inhale 1 puff into the lungs 2 (two) times daily. 09/25/15  Yes Tammy S Parrett, NP  ondansetron (ZOFRAN) 4 MG tablet Take 1 tablet (4 mg total) by mouth every 8 (eight) hours as needed for nausea or vomiting. 06/07/16   Veatrice Bourbon, MD    Family History Family History  Problem Relation Age of Onset  . Cancer Father   . Asthma Mother   . Colon cancer Neg Hx     Social History Social History  Substance Use  Topics  . Smoking status: Former Smoker    Packs/day: 0.50    Years: 40.00    Types: Cigarettes    Quit date: 11/11/2014  . Smokeless tobacco: Never Used  . Alcohol use No     Allergies   Review of patient's allergies indicates no known allergies.   Review of Systems Review of Systems  Constitutional: Negative.   HENT: Negative.   Respiratory: Negative.   Cardiovascular: Negative.   Gastrointestinal: Positive for diarrhea, nausea and vomiting.  Genitourinary: Negative.   Musculoskeletal:       Leg cramping  Neurological: Positive for numbness.     Physical Exam Updated Vital Signs BP 129/96 (BP Location: Left Arm)   Pulse 96   Temp 98.5 F (36.9 C) (Oral)   Resp 18   Ht _0  (1.6 m)   Wt 55.3 kg   SpO2 99%   BMI 21.61 kg/m   Physical Exam  Constitutional: He is oriented to person, place, and time. He appears well-developed and well-nourished.  HENT:  Head: Normocephalic and atraumatic.  Eyes: EOM are normal. Pupils are equal, round, and reactive to light.  Neck: Normal range of motion. Neck supple.  Cardiovascular: Normal rate, regular rhythm and normal heart sounds.   No murmur heard. Pulmonary/Chest: Effort normal and breath sounds normal. No respiratory distress.  Abdominal: Soft. Bowel sounds are normal. He exhibits no distension. There is no tenderness. There is no guarding.  Musculoskeletal: Normal range of motion. He exhibits edema.  Neurological: He is alert and oriented to person, place, and time.  Skin: Skin is warm.  Psychiatric: He has a normal mood and affect.     ED Treatments / Results  Labs (all labs ordered are listed, but only abnormal results are displayed) Labs Reviewed  COMPREHENSIVE METABOLIC PANEL - Abnormal; Notable for the following:       Result Value   Sodium 131 (*)    CO2 20 (*)    Glucose, Bld 116 (*)    Creatinine, Ser 1.63 (*)    Total Protein 9.0 (*)    GFR calc non Af Amer 44 (*)    GFR calc Af Amer 51 (*)    All  other components within normal limits  CBC    EKG  EKG Interpretation None       Radiology No results found.  Procedures Procedures (including critical care time)  Medications Ordered in ED Medications  0.9 %  sodium chloride infusion (0 mLs Intravenous Stopped 06/07/16 1243)  ondansetron (ZOFRAN) tablet 4 mg (4 mg Oral Given 06/07/16 1146)     Initial Impression / Assessment and Plan / ED Course  I have reviewed the triage vital signs and the nursing notes.  Pertinent labs & imaging results that were available during my care of the patient were reviewed by me and considered in  my medical decision making (see chart for details).  Clinical Course    Uncomplicated ED course  Final Clinical Impressions(s) / ED Diagnoses   Final diagnoses:  Nausea vomiting and diarrhea   60 y/o male presenting for diarrhea, nausea and emesis. Diarrhea and emesis resoled prior to arriving to the ED. He was hemodynamically stable in the ED with normal vital signs and benign abdominal examination. Screening labs were performed and showed no gross abnormalities. He was previously admitted for acute renal failure and is renal function was still improved. He was given IVF, antiemetics in the ED and was able to tolerate oral intake prior to discharge. He was discharged in stable condition to follow with his PCP.    New Prescriptions New Prescriptions   ONDANSETRON (ZOFRAN) 4 MG TABLET    Take 1 tablet (4 mg total) by mouth every 8 (eight) hours as needed for nausea or vomiting.     Veatrice Bourbon, MD 06/07/16 [place], MD 06/07/16 [phone]

## 2016-06-07 NOTE — ED Notes (Signed)
Pt given PO fluids.

## 2016-07-03 ENCOUNTER — Emergency Department (HOSPITAL_COMMUNITY)
Admission: EM | Admit: 2016-07-03 | Discharge: 2016-07-03 | Disposition: A | Discharge status: Home / Self Care | Payer: Medicare Other | Attending: Emergency Medicine | Admitting: Emergency Medicine

## 2016-07-03 ENCOUNTER — Encounter (HOSPITAL_COMMUNITY): Payer: Self-pay | Admitting: Emergency Medicine

## 2016-07-03 DIAGNOSIS — I10 Essential (primary) hypertension: Secondary | ICD-10-CM | POA: Insufficient documentation

## 2016-07-03 DIAGNOSIS — K0889 Other specified disorders of teeth and supporting structures: Secondary | ICD-10-CM | POA: Diagnosis present

## 2016-07-03 DIAGNOSIS — Z79899 Other long term (current) drug therapy: Secondary | ICD-10-CM | POA: Diagnosis not present

## 2016-07-03 DIAGNOSIS — K047 Periapical abscess without sinus: Secondary | ICD-10-CM | POA: Diagnosis not present

## 2016-07-03 DIAGNOSIS — Z87891 Personal history of nicotine dependence: Secondary | ICD-10-CM | POA: Diagnosis not present

## 2016-07-03 DIAGNOSIS — J449 Chronic obstructive pulmonary disease, unspecified: Secondary | ICD-10-CM | POA: Insufficient documentation

## 2016-07-03 DIAGNOSIS — J45909 Unspecified asthma, uncomplicated: Secondary | ICD-10-CM | POA: Insufficient documentation

## 2016-07-03 MED ORDER — LIDOCAINE HCL (PF) 2 % IJ SOLN
10.0000 mL | Freq: Once | INTRAMUSCULAR | Status: AC
  Administered 2016-07-03: 10 mL
  Filled 2016-07-03: qty 10

## 2016-07-03 MED ORDER — BENZOCAINE (TOPICAL) 20 % EX AERO
INHALATION_SPRAY | Freq: Once | CUTANEOUS | Status: AC
  Administered 2016-07-03: 09:00:00 via OROMUCOSAL

## 2016-07-03 MED ORDER — ONDANSETRON HCL 4 MG PO TABS
4.0000 mg | ORAL_TABLET | Freq: Once | ORAL | Status: AC
  Administered 2016-07-03: 4 mg via ORAL
  Filled 2016-07-03: qty 1

## 2016-07-03 MED ORDER — BENZOCAINE (TOPICAL) 20 % EX AERO: INHALATION_SPRAY | Freq: Four times a day (QID) | CUTANEOUS | Status: DC | PRN

## 2016-07-03 MED ORDER — ACETAMINOPHEN-CODEINE #3 300-30 MG PO TABS: 1.0000 | ORAL_TABLET | Freq: Four times a day (QID) | ORAL | 0 refills | Status: DC | PRN

## 2016-07-03 MED ORDER — BENZOCAINE 20 % MT AERO
INHALATION_SPRAY | OROMUCOSAL | Status: AC
  Filled 2016-07-03: qty 57

## 2016-07-03 MED ORDER — CLINDAMYCIN HCL 300 MG PO CAPS: ORAL_CAPSULE | ORAL | 0 refills | Status: DC

## 2016-07-03 MED ORDER — CLINDAMYCIN HCL 150 MG PO CAPS
300.0000 mg | ORAL_CAPSULE | Freq: Once | ORAL | Status: AC
  Administered 2016-07-03: 300 mg via ORAL
  Filled 2016-07-03: qty 2

## 2016-07-03 NOTE — Discharge Instructions (Signed)
It is important that you see a dentist as soon as possible. Use clindamycin three times daily with a meal. Use tylenol for mild pain. Use tylenol codeine for more severe pain. This medication may cause drowsiness. Please do not drink, drive, or participate in activity that requires concentration while taking this medication.

## 2016-07-03 NOTE — ED Provider Notes (Signed)
[facility] DEPT Provider Note   CSN: [id number] Arrival date & time: 07/03/16  0804     History   Chief Complaint No chief complaint on file.   HPI Bryan [name] is a 60 y.o. male.  Patient is a 60 year old male who presents to the emergency department with a complaint of oral swelling.  The patient states that this problem started on Friday, October 20. He is noticed increasing swelling and pain on the right upper jaw since that time. The patient states that now he has pain when he coughs and he has pain if he touches his gum area. He thinks that he may have had fever either Friday and/or Saturday. He does not note fever today. He has not had nausea vomiting. He's not had problems with swallowing. He's not had problems with breathing. He presents to the emergency department for assistance with this problem, as he states that over-the-counter medications are no longer helping at all.   The history is provided by the patient.    Past Medical History:  Diagnosis Date  . Acid reflux   . Asthma   . Chronic pain    with leg and back pain (disc problem)  . COPD (chronic obstructive pulmonary disease) ([place])   . History of upper GI x-ray series    to follow showed large duodenal ulcer H pylori serologies were negative  . Hypertension   . Tubular adenoma     Patient Active Problem List   Diagnosis Date Noted  . Acute renal failure (ARF) ([place]) 05/16/2016  . Nausea with vomiting 05/16/2016  . Dehydration 05/16/2016  . Hypertension   . Chronic pain   . Heme positive stool 10/20/2014  . Abdominal pain, epigastric 06/19/2014  . Esophageal dysphagia 06/19/2014  . AP (abdominal pain) 11/13/2013  . Early satiety 11/13/2013  . COPD, severe ([place]) 02/22/2013  . Low back pain 11/19/2012  . COLITIS 05/12/2009  . ABDOMINAL PAIN, LEFT LOWER QUADRANT 05/12/2009  . DUODENAL ULCER, HX OF 05/12/2009  . ALCOHOL USE 05/11/2009  . GERD 05/11/2009  . Diarrhea 05/11/2009  . SPONDYLOSIS,  CERVICAL 07/03/2007  . NECK PAIN 07/03/2007    Past Surgical History:  Procedure Laterality Date  . BACK SURGERY  7/05; 5/09    Dr.Hirsch,3 lumbar  . BACK SURGERY    . COLONOSCOPY  Feb 2012   Dr. Gala Romney: normal rectum, pedunculate polyp removed but not recovered  . COLONOSCOPY WITH ESOPHAGOGASTRODUODENOSCOPY (EGD) N/A 11/18/2013   Dr.Rourk- tcs= normal rectum, multipe polyps about the ileocecal valve and distal transverse colon o/w the remainder of the colonic mucosa appeared normal bx= tubular adenoma. EGD= normal esophagus, stomach with scattered erosions mottling, friablility, no ulcer or infiltrating process patent pylorus bx= chronic inflammation. next TCS 11/2016  . ESOPHAGOGASTRODUODENOSCOPY  1/06   Dr. Volney American esophageal erosions,U-shaped stomach,marked erosions and edema of the bulb without discrete ulcer disease.   Marland Kitchen NECK SURGERY  2/09       Home Medications    Prior to Admission medications   Medication Sig Start Date End Date Taking? Authorizing Provider  albuterol (PROVENTIL HFA;VENTOLIN HFA) 108 (90 BASE) MCG/ACT inhaler Inhale 1-2 puffs into the lungs every 4 (four) hours as needed for wheezing or shortness of breath. 07/08/15   Fransico Meadow, PA-C  cyclobenzaprine (FLEXERIL) 5 MG tablet Take 1 tablet (5 mg total) by mouth 3 (three) times daily as needed (muscle soreness). 12/02/15   Rolland Porter, MD  lisinopril (PRINIVIL,ZESTRIL) 20 MG tablet Take 20 mg by  mouth daily.    Historical Provider, MD  NEXIUM 40 MG capsule TAKE 1 CAPSULE BY MOUTH TWICE DAILY BEFORE A MEAL 05/19/16   [name], NP  ondansetron (ZOFRAN) 4 MG tablet Take 1 tablet (4 mg total) by mouth every 8 (eight) hours as needed for nausea or vomiting. 06/07/16   Veatrice Bourbon, MD  SYMBICORT 160-4.5 MCG/ACT inhaler Inhale 1 puff into the lungs 2 (two) times daily. 09/25/15   Melvenia Needles, NP    Family History Family History  Problem Relation Age of Onset  . Cancer Father   . Asthma Mother   . Colon  cancer Neg Hx     Social History Social History  Substance Use Topics  . Smoking status: Former Smoker    Packs/day: 0.50    Years: 40.00    Types: Cigarettes    Quit date: 11/11/2014  . Smokeless tobacco: Never Used  . Alcohol use No     Allergies   Review of patient's allergies indicates no known allergies.   Review of Systems Review of Systems  Constitutional: Negative for activity change.       All ROS Neg except as noted in HPI  HENT: Positive for dental problem. Negative for nosebleeds, trouble swallowing and voice change.   Eyes: Negative for photophobia and discharge.  Respiratory: Negative for cough, shortness of breath and wheezing.   Cardiovascular: Negative for chest pain and palpitations.  Gastrointestinal: Negative for abdominal pain and blood in stool.  Genitourinary: Negative for dysuria, frequency and hematuria.  Musculoskeletal: Positive for arthralgias and back pain. Negative for neck pain.  Skin: Negative.   Neurological: Negative for dizziness, seizures and speech difficulty.  Psychiatric/Behavioral: Negative for confusion and hallucinations.     Physical Exam Updated Vital Signs There were no vitals taken for this visit.  Physical Exam  Constitutional: Vital signs are normal. He appears well-developed and well-nourished. He is active.  HENT:  Head: Normocephalic and atraumatic.  Right Ear: Tympanic membrane, external ear and ear canal normal.  Left Ear: Tympanic membrane, external ear and ear canal normal.  Nose: Nose normal.  Mouth/Throat: Uvula is midline and mucous membranes are normal. Abnormal dentition. Dental caries present.  Abscess of the right upper molar area. Swelling of the gum, and mild facial swelling present.  Eyes: Conjunctivae, EOM and lids are normal. Pupils are equal, round, and reactive to light.  Neck: Trachea normal, normal range of motion and phonation normal. Neck supple. Carotid bruit is not present.  Cardiovascular:  Normal rate, regular rhythm and normal pulses.   Abdominal: Soft. Normal appearance and bowel sounds are normal.  Lymphadenopathy:       Head (right side): No submental, no preauricular and no posterior auricular adenopathy present.       Head (left side): No submental, no preauricular and no posterior auricular adenopathy present.    He has no cervical adenopathy.  Neurological: He is alert. He has normal strength. No cranial nerve deficit or sensory deficit. GCS eye subscore is 4. GCS verbal subscore is 5. GCS motor subscore is 6.  Skin: Skin is warm and dry.  Psychiatric: His speech is normal.     ED Treatments / Results  Labs (all labs ordered are listed, but only abnormal results are displayed) Labs Reviewed - No data to display  EKG  EKG Interpretation None       Radiology No results found.  Procedures Dental Block Date/Time: 07/03/2016 9:39 AM Performed by: [name] Authorized  by: [name]   Consent:    Consent obtained:  Verbal   Consent given by:  Patient   Risks discussed:  Allergic reaction and infection Indications:    Indications: dental abscess   Location:    Block type:  Posterior superior alveolar   Laterality:  Right Procedure details (see MAR for exact dosages):    Topical anesthetic:  Benzocaine spray   Needle gauge:  30 G   Anesthetic injected:  Lidocaine 2% w/o epi   Injection procedure:  Anatomic landmarks identified, introduced needle, incremental injection and negative aspiration for blood Post-procedure details:    Outcome:  Anesthesia achieved   Patient tolerance of procedure:  Tolerated well, no immediate complications   (including critical care time)  Medications Ordered in ED Medications - No data to display   Initial Impression / Assessment and Plan / ED Course  I have reviewed the triage vital signs and the nursing notes.  Pertinent labs & imaging results that were available during my care of the patient were  reviewed by me and considered in my medical decision making (see chart for details).  Clinical Course    **I have reviewed nursing notes, vital signs, and all appropriate lab and imaging results for this patient.*  Final Clinical Impressions(s) / ED Diagnoses  Patient has a swelling of the right upper gum and what appears to be an abscess. Anesthesia was achieved with dental block. The area was aspirated. No pus like material noted. I've advised the patient to see a dentist as of the infection is most likely in the root system on. Patient will be treated with clindamycin and Tylenol codeine.    Final diagnoses:  None    New Prescriptions New Prescriptions   No medications on file     [name], PA-C 07/03/16 [place] Liu, MD 07/03/16 [phone]

## 2016-07-03 NOTE — ED Triage Notes (Signed)
Patient complains of swollen gums on right lower side of jaw. States cough that has kept him awake last night. NAD.

## 2016-09-08 DIAGNOSIS — R945 Abnormal results of liver function studies: Secondary | ICD-10-CM | POA: Diagnosis not present

## 2016-09-08 DIAGNOSIS — I10 Essential (primary) hypertension: Secondary | ICD-10-CM | POA: Diagnosis not present

## 2016-09-08 DIAGNOSIS — Z0000 Encounter for general adult medical examination without abnormal findings: Secondary | ICD-10-CM | POA: Diagnosis not present

## 2016-09-08 DIAGNOSIS — J449 Chronic obstructive pulmonary disease, unspecified: Secondary | ICD-10-CM | POA: Diagnosis not present

## 2016-09-08 DIAGNOSIS — C61 Malignant neoplasm of prostate: Secondary | ICD-10-CM | POA: Diagnosis not present

## 2016-09-08 DIAGNOSIS — M549 Dorsalgia, unspecified: Secondary | ICD-10-CM | POA: Diagnosis not present

## 2016-09-08 DIAGNOSIS — K219 Gastro-esophageal reflux disease without esophagitis: Secondary | ICD-10-CM | POA: Diagnosis not present

## 2016-10-07 ENCOUNTER — Ambulatory Visit: Payer: Medicare Other | Admitting: Adult Health

## 2016-10-20 ENCOUNTER — Encounter: Payer: Self-pay | Admitting: Internal Medicine

## 2016-12-01 ENCOUNTER — Emergency Department (HOSPITAL_COMMUNITY)
Admission: EM | Admit: 2016-12-01 | Discharge: 2016-12-01 | Disposition: A | Discharge status: Home / Self Care | Payer: Medicare Other | Attending: Emergency Medicine | Admitting: Emergency Medicine

## 2016-12-01 ENCOUNTER — Encounter (HOSPITAL_COMMUNITY): Payer: Self-pay | Admitting: Emergency Medicine

## 2016-12-01 DIAGNOSIS — Z87891 Personal history of nicotine dependence: Secondary | ICD-10-CM | POA: Insufficient documentation

## 2016-12-01 DIAGNOSIS — M6281 Muscle weakness (generalized): Secondary | ICD-10-CM | POA: Diagnosis not present

## 2016-12-01 DIAGNOSIS — R0602 Shortness of breath: Secondary | ICD-10-CM | POA: Diagnosis not present

## 2016-12-01 DIAGNOSIS — Z79899 Other long term (current) drug therapy: Secondary | ICD-10-CM | POA: Insufficient documentation

## 2016-12-01 DIAGNOSIS — N399 Disorder of urinary system, unspecified: Secondary | ICD-10-CM | POA: Insufficient documentation

## 2016-12-01 DIAGNOSIS — I10 Essential (primary) hypertension: Secondary | ICD-10-CM | POA: Insufficient documentation

## 2016-12-01 DIAGNOSIS — J45909 Unspecified asthma, uncomplicated: Secondary | ICD-10-CM | POA: Diagnosis not present

## 2016-12-01 DIAGNOSIS — J449 Chronic obstructive pulmonary disease, unspecified: Secondary | ICD-10-CM | POA: Diagnosis not present

## 2016-12-01 DIAGNOSIS — R39198 Other difficulties with micturition: Secondary | ICD-10-CM

## 2016-12-01 LAB — URINALYSIS, ROUTINE W REFLEX MICROSCOPIC
BILIRUBIN URINE: NEGATIVE
Glucose, UA: NEGATIVE mg/dL
Hgb urine dipstick: NEGATIVE
KETONES UR: NEGATIVE mg/dL
Leukocytes, UA: NEGATIVE
NITRITE: NEGATIVE
PROTEIN: NEGATIVE mg/dL
Specific Gravity, Urine: 1.009 (ref 1.005–1.030)
pH: 5 (ref 5.0–8.0)

## 2016-12-01 LAB — BASIC METABOLIC PANEL
Anion gap: 6 (ref 5–15)
BUN: 9 mg/dL (ref 6–20)
CALCIUM: 8.9 mg/dL (ref 8.9–10.3)
CO2: 27 mmol/L (ref 22–32)
CREATININE: 1.1 mg/dL (ref 0.61–1.24)
Chloride: 108 mmol/L (ref 101–111)
Glucose, Bld: 104 mg/dL — ABNORMAL HIGH (ref 65–99)
Potassium: 3.6 mmol/L (ref 3.5–5.1)
SODIUM: 141 mmol/L (ref 135–145)

## 2016-12-01 LAB — CBC
HCT: 39.3 % (ref 39.0–52.0)
Hemoglobin: 13.6 g/dL (ref 13.0–17.0)
MCH: 30.2 pg (ref 26.0–34.0)
MCHC: 34.6 g/dL (ref 30.0–36.0)
MCV: 87.1 fL (ref 78.0–100.0)
PLATELETS: 257 10*3/uL (ref 150–400)
RBC: 4.51 MIL/uL (ref 4.22–5.81)
RDW: 14.7 % (ref 11.5–15.5)
WBC: 7.4 10*3/uL (ref 4.0–10.5)

## 2016-12-01 MED ORDER — IPRATROPIUM-ALBUTEROL 0.5-2.5 (3) MG/3ML IN SOLN
3.0000 mL | Freq: Once | RESPIRATORY_TRACT | Status: AC
  Administered 2016-12-01: 3 mL via RESPIRATORY_TRACT
  Filled 2016-12-01: qty 3

## 2016-12-01 MED ORDER — TRAMADOL HCL 50 MG PO TABS: 50.0000 mg | ORAL_TABLET | Freq: Four times a day (QID) | ORAL | 0 refills | Status: DC | PRN

## 2016-12-01 NOTE — ED Provider Notes (Signed)
[name] DEPT Provider Note   CSN: [id number] Arrival date & time: 12/01/16  1814 By signing my name below, I, Dyke Brackett, attest that this documentation has been prepared under the direction and in the presence of Fredia Sorrow, MD . Electronically Signed: Dyke Brackett, Scribe. 12/01/2016. 9:03 PM.   History   Chief Complaint Chief Complaint  Patient presents with  . Extremity Weakness    both    HPI Bryan [name] is a 61 y.o. male with a history of chronic pain, COPD, who presents to the Emergency Department complaining of bilateral leg weakness on set today at 4 pm. Per pt, both of his legs "gave out" on him today.He reports associated back pain, bilateral leg pain, rash to bilateral feet, abdominal pain, dysuria, and difficulty urinating. Pt describes his pain as a burning sensation that radiates downwards. No alleviating or modifying factors noted.  Pt also reports intermittent chest pain and chronic shortness of breath. He denies use of blood thinners. Pt denies any fever, chills, rhinorrhea, sore throat, cough, visual disturbance, n/v/d, hematuria,  joint swelling, or headaches.    The history is provided by the patient. No language interpreter was used.    Past Medical History:  Diagnosis Date  . Acid reflux   . Asthma   . Chronic pain    with leg and back pain (disc problem)  . COPD (chronic obstructive pulmonary disease) ([place])   . History of upper GI x-ray series    to follow showed large duodenal ulcer H pylori serologies were negative  . Hypertension   . Tubular adenoma     Patient Active Problem List   Diagnosis Date Noted  . Acute renal failure (ARF) ([place]) 05/16/2016  . Nausea with vomiting 05/16/2016  . Dehydration 05/16/2016  . Hypertension   . Chronic pain   . Heme positive stool 10/20/2014  . Abdominal pain, epigastric 06/19/2014  . Esophageal dysphagia 06/19/2014  . AP (abdominal pain) 11/13/2013  . Early satiety 11/13/2013  . COPD, severe  ([place]) 02/22/2013  . Low back pain 11/19/2012  . COLITIS 05/12/2009  . ABDOMINAL PAIN, LEFT LOWER QUADRANT 05/12/2009  . DUODENAL ULCER, HX OF 05/12/2009  . ALCOHOL USE 05/11/2009  . GERD 05/11/2009  . Diarrhea 05/11/2009  . SPONDYLOSIS, CERVICAL 07/03/2007  . NECK PAIN 07/03/2007    Past Surgical History:  Procedure Laterality Date  . BACK SURGERY  7/05; 5/09    Dr.Hirsch,3 lumbar  . BACK SURGERY    . COLONOSCOPY  Feb 2012   Dr. Gala Romney: normal rectum, pedunculate polyp removed but not recovered  . COLONOSCOPY WITH ESOPHAGOGASTRODUODENOSCOPY (EGD) N/A 11/18/2013   Dr.Rourk- tcs= normal rectum, multipe polyps about the ileocecal valve and distal transverse colon o/w the remainder of the colonic mucosa appeared normal bx= tubular adenoma. EGD= normal esophagus, stomach with scattered erosions mottling, friablility, no ulcer or infiltrating process patent pylorus bx= chronic inflammation. next TCS 11/2016  . ESOPHAGOGASTRODUODENOSCOPY  1/06   Dr. Volney American esophageal erosions,U-shaped stomach,marked erosions and edema of the bulb without discrete ulcer disease.   Marland Kitchen NECK SURGERY  2/09    Home Medications    Prior to Admission medications   Medication Sig Start Date End Date Taking? Authorizing Provider  albuterol (PROVENTIL HFA;VENTOLIN HFA) 108 (90 BASE) MCG/ACT inhaler Inhale 1-2 puffs into the lungs every 4 (four) hours as needed for wheezing or shortness of breath. 07/08/15  Yes Hollace Kinnier Sofia, PA-C  esomeprazole (NEXIUM) 40 MG capsule Take 40 mg by mouth daily.  Yes Historical Provider, MD  lisinopril-hydrochlorothiazide (PRINZIDE,ZESTORETIC) 10-12.5 MG tablet Take 1 tablet by mouth daily.   Yes Historical Provider, MD  SYMBICORT 160-4.5 MCG/ACT inhaler Inhale 1 puff into the lungs 2 (two) times daily. 09/25/15  Yes Tammy S Parrett, NP  traMADol (ULTRAM) 50 MG tablet Take 1 tablet (50 mg total) by mouth every 6 (six) hours as needed. 12/01/16   Fredia Sorrow, MD    Family  History Family History  Problem Relation Age of Onset  . Cancer Father   . Asthma Mother   . Colon cancer Neg Hx     Social History Social History  Substance Use Topics  . Smoking status: Former Smoker    Packs/day: 0.50    Years: 40.00    Types: Cigarettes    Quit date: 11/11/2014  . Smokeless tobacco: Never Used  . Alcohol use No     Allergies   Patient has no known allergies.   Review of Systems Review of Systems  Constitutional: Negative for chills and fever.  HENT: Negative for rhinorrhea and sore throat.   Eyes: Negative for visual disturbance.  Respiratory: Positive for shortness of breath. Negative for cough.   Cardiovascular: Positive for chest pain. Negative for leg swelling.  Gastrointestinal: Negative for diarrhea, nausea and vomiting.  Genitourinary: Positive for difficulty urinating and dysuria. Negative for hematuria.  Musculoskeletal: Positive for back pain. Negative for joint swelling.  Skin: Positive for rash.  Neurological: Positive for weakness. Negative for headaches.  Hematological: Does not bruise/bleed easily.  Psychiatric/Behavioral: Negative for confusion.    Physical Exam Updated Vital Signs BP 133/88   Pulse 77   Temp 98 F (36.7 C) (Oral)   Resp 19   Ht _0  (1.6 m)   Wt 54.9 kg   SpO2 100%   BMI 21.43 kg/m   Physical Exam  Constitutional: He is oriented to person, place, and time. He appears well-developed and well-nourished. No distress.  HENT:  Head: Normocephalic and atraumatic.  Mouth/Throat: Mucous membranes are normal.  Eyes: Conjunctivae and EOM are normal. Pupils are equal, round, and reactive to light. No scleral icterus.  Cardiovascular: Normal rate and regular rhythm.   Pulmonary/Chest: Effort normal and breath sounds normal. No respiratory distress. He has no wheezes. He has no rales.  Abdominal: Bowel sounds are normal. He exhibits no distension.  Neurological: He is alert and oriented to person, place, and time.  No cranial nerve deficit or sensory deficit. He exhibits normal muscle tone. Coordination normal.  Skin: Skin is warm and dry. Capillary refill <1 second.  Psychiatric: He has a normal mood and affect.  Nursing note and vitals reviewed.   ED Treatments / Results  DIAGNOSTIC STUDIES:  Oxygen Saturation is 97% on RA, normal by my interpretation.    COORDINATION OF CARE:  8:57 PM Discussed treatment plan which includes bladder scan and breathing tx with pt at bedside and pt agreed to plan.   Labs (all labs ordered are listed, but only abnormal results are displayed) Labs Reviewed  URINALYSIS, ROUTINE W REFLEX MICROSCOPIC - Abnormal; Notable for the following:       Result Value   Color, Urine STRAW (*)    All other components within normal limits  BASIC METABOLIC PANEL - Abnormal; Notable for the following:    Glucose, Bld 104 (*)    All other components within normal limits  CBC    EKG  EKG Interpretation  Date/Time:  Thursday December 01 2016 18:27:18 EDT Ventricular Rate:  75 PR Interval:    QRS Duration: 90 QT Interval:  371 QTC Calculation: 415 R Axis:   90 Text Interpretation:  Sinus rhythm Borderline right axis deviation Low voltage, precordial leads Probable anteroseptal infarct, old Artifact Confirmed by Caison Hearn  MD, Burke Terry [id number]) on 12/01/2016 9:04:14 PM       Radiology No results found.  Procedures Procedures (including critical care time)  Medications Ordered in ED Medications  ipratropium-albuterol (DUONEB) 0.5-2.5 (3) MG/3ML nebulizer solution 3 mL (3 mLs Nebulization Given 12/01/16 2324)     Initial Impression / Assessment and Plan / ED Course  I have reviewed the triage vital signs and the nursing notes.  Pertinent labs & imaging results that were available during my care of the patient were reviewed by me and considered in my medical decision making (see chart for details).     Patient presents with several complaints. Workup without any  significant findings. Difficulty with urination urinalysis is normal bladder scan without any significant retention. Today legs gave out due to pain in the low part of the back. Patient also with complaint of some numbness in both legs bilaterally. Pain currently now 0 out of 10.  Patient also felt he needed breathing treatment that was given to him elbow he wasn't wheezing. And he feels better after that.  Patients old enough to perhaps have some prostate issues. Will refer patient to urology for evaluation. Patient also will return back to his regular doctor for further follow-up who is Dr. Legrand Rams. Patient nontoxic no acute distress patient stable for discharge home.  Final Clinical Impressions(s) / ED Diagnoses   Final diagnoses:  Difficulty voiding    New Prescriptions New Prescriptions   TRAMADOL (ULTRAM) 50 MG TABLET    Take 1 tablet (50 mg total) by mouth every 6 (six) hours as needed.     I personally performed the services described in this documentation, which was scribed in my presence. The recorded information has been reviewed and is accurate.      Fredia Sorrow, MD 12/01/16 [phone]

## 2016-12-01 NOTE — ED Notes (Signed)
Report given to [employer]

## 2016-12-01 NOTE — ED Notes (Signed)
Pt is able to walk from w/c to bed without assistance.

## 2016-12-01 NOTE — ED Notes (Signed)
ED Provider at bedside. 

## 2016-12-01 NOTE — Discharge Instructions (Signed)
Make appointment to follow-up with urology. May component to follow-up with your regular doctor. Take the tramadol in the meantime for the back pain leg pain. Today's urinalysis was negative. Return for any new or worse symptoms.

## 2016-12-01 NOTE — ED Triage Notes (Signed)
c/o new onset leg weakness, bilaterally.  Also c/o difficulties with urination.  Rates pain 0/10, just c/o  numbness and burning.  c/o urinary incontinence.

## 2016-12-05 DIAGNOSIS — M1990 Unspecified osteoarthritis, unspecified site: Secondary | ICD-10-CM | POA: Diagnosis not present

## 2016-12-05 DIAGNOSIS — J449 Chronic obstructive pulmonary disease, unspecified: Secondary | ICD-10-CM | POA: Diagnosis not present

## 2016-12-05 DIAGNOSIS — K219 Gastro-esophageal reflux disease without esophagitis: Secondary | ICD-10-CM | POA: Diagnosis not present

## 2016-12-14 ENCOUNTER — Ambulatory Visit (INDEPENDENT_AMBULATORY_CARE_PROVIDER_SITE_OTHER): Payer: Medicare Other

## 2016-12-14 ENCOUNTER — Ambulatory Visit (INDEPENDENT_AMBULATORY_CARE_PROVIDER_SITE_OTHER): Payer: Medicare Other | Admitting: Orthopaedic Surgery

## 2016-12-14 ENCOUNTER — Encounter: Payer: Self-pay | Admitting: Orthopaedic Surgery

## 2016-12-14 VITALS — BP 142/86 | HR 72 | Temp 97.7°F | Resp 18 | Ht 63.0 in | Wt 120.0 lb

## 2016-12-14 DIAGNOSIS — M25562 Pain in left knee: Secondary | ICD-10-CM

## 2016-12-14 DIAGNOSIS — G8929 Other chronic pain: Secondary | ICD-10-CM

## 2016-12-14 NOTE — Progress Notes (Signed)
Subjective:    Patient ID: Bryan [name], male    DOB: 13-May-1956, 61 y.o.   MRN: [id number]  HPI He has had left knee pain for about a month to six weeks getting worse.  He has no trauma.  He has no swelling.  He has giving way of the knee and he has fallen.  He has taken Tylenol and used ice and heat with no help.  He is not getting better.  He saw Dr. Legrand Rams.   Review of Systems  HENT: Negative for congestion.   Respiratory: Positive for shortness of breath. Negative for cough.   Cardiovascular: Negative for chest pain and leg swelling.  Endocrine: Positive for cold intolerance.  Musculoskeletal: Positive for arthralgias, back pain and gait problem.  Allergic/Immunologic: Positive for environmental allergies.   Past Medical History:  Diagnosis Date  . Acid reflux   . Asthma   . Chronic pain    with leg and back pain (disc problem)  . COPD (chronic obstructive pulmonary disease) ([place])   . History of upper GI x-ray series    to follow showed large duodenal ulcer H pylori serologies were negative  . Hypertension   . Tubular adenoma     Past Surgical History:  Procedure Laterality Date  . BACK SURGERY  7/05; 5/09    Dr.Hirsch,3 lumbar  . BACK SURGERY    . COLONOSCOPY  Feb 2012   Dr. Gala Romney: normal rectum, pedunculate polyp removed but not recovered  . COLONOSCOPY WITH ESOPHAGOGASTRODUODENOSCOPY (EGD) N/A 11/18/2013   Dr.Rourk- tcs= normal rectum, multipe polyps about the ileocecal valve and distal transverse colon o/w the remainder of the colonic mucosa appeared normal bx= tubular adenoma. EGD= normal esophagus, stomach with scattered erosions mottling, friablility, no ulcer or infiltrating process patent pylorus bx= chronic inflammation. next TCS 11/2016  . ESOPHAGOGASTRODUODENOSCOPY  1/06   Dr. Volney American esophageal erosions,U-shaped stomach,marked erosions and edema of the bulb without discrete ulcer disease.   Marland Kitchen NECK SURGERY  2/09    Current Outpatient Prescriptions on File  Prior to Visit  Medication Sig Dispense Refill  . albuterol (PROVENTIL HFA;VENTOLIN HFA) 108 (90 BASE) MCG/ACT inhaler Inhale 1-2 puffs into the lungs every 4 (four) hours as needed for wheezing or shortness of breath. 1 Inhaler 0  . esomeprazole (NEXIUM) 40 MG capsule Take 40 mg by mouth daily.    Marland Kitchen lisinopril-hydrochlorothiazide (PRINZIDE,ZESTORETIC) 10-12.5 MG tablet Take 1 tablet by mouth daily.    . SYMBICORT 160-4.5 MCG/ACT inhaler Inhale 1 puff into the lungs 2 (two) times daily. 1 Inhaler 5  . traMADol (ULTRAM) 50 MG tablet Take 1 tablet (50 mg total) by mouth every 6 (six) hours as needed. 15 tablet 0   No current facility-administered medications on file prior to visit.     Social History   Social History  . Marital status: Single    Spouse name: N/A  . Number of children: 2  . Years of education: N/A   Occupational History  . disabled Unemployed   Social History Main Topics  . Smoking status: Former Smoker    Packs/day: 0.50    Years: 40.00    Types: Cigarettes    Quit date: 11/11/2014  . Smokeless tobacco: Never Used  . Alcohol use No  . Drug use: Yes    Types: Marijuana  . Sexual activity: Not on file   Other Topics Concern  . Not on file   Social History Narrative   Lives with girlfriend, is on disability  for history of back injuries.    Family History  Problem Relation Age of Onset  . Cancer Father   . Asthma Mother   . Colon cancer Neg Hx     BP (!) 142/86   Pulse 72   Temp 97.7 F (36.5 C)   Resp 18   Ht _0  (1.6 m)   Wt 120 lb (54.4 kg)   BMI 21.26 kg/m       Objective:   Physical Exam  Constitutional: He is oriented to person, place, and time. He appears well-developed and well-nourished.  HENT:  Head: Normocephalic and atraumatic.  Eyes: Conjunctivae and EOM are normal. Pupils are equal, round, and reactive to light.  Neck: Normal range of motion. Neck supple.  Cardiovascular: Normal rate, regular rhythm and intact distal pulses.    Pulmonary/Chest: Effort normal.  Abdominal: Soft.  Musculoskeletal: He exhibits tenderness (left knee tender, no effusion, ROM 0to 115, stable, NV intact.  Limp to the left.  Right knee negative.).  Neurological: He is alert and oriented to person, place, and time. He has normal reflexes. He displays normal reflexes. No cranial nerve deficit. He exhibits normal muscle tone. Coordination normal.  Skin: Skin is warm and dry.  Psychiatric: He has a normal mood and affect. His behavior is normal. Judgment and thought content normal.    X-rays were done of the left knee, reported separately.      Assessment & Plan:   Encounter Diagnosis  Name Primary?  . Chronic pain of left knee Yes   PROCEDURE NOTE:  The patient requests injections of the left knee , verbal consent was obtained.  The left knee was prepped appropriately after time out was performed.   Sterile technique was observed and injection of 1 cc of Depo-Medrol 40 mg with several cc's of plain xylocaine. Anesthesia was provided by ethyl chloride and a 20-gauge needle was used to inject the knee area. The injection was tolerated well.  A band aid dressing was applied.  The patient was advised to apply ice later today and tomorrow to the injection sight as needed.  I will see him in two weeks.  If not improved, consider MRI of the left knee. I am concerned about meniscus tear.  Call if any problem.  Precautions discussed.  Electronically Signed Sanjuana Kava, MD 4/4/201810:50 AM

## 2016-12-28 ENCOUNTER — Encounter: Payer: Self-pay | Admitting: Orthopaedic Surgery

## 2016-12-28 ENCOUNTER — Ambulatory Visit (INDEPENDENT_AMBULATORY_CARE_PROVIDER_SITE_OTHER): Payer: Medicare Other | Admitting: Orthopaedic Surgery

## 2016-12-28 VITALS — BP 122/76 | HR 80 | Temp 97.3°F | Ht 63.0 in | Wt 119.0 lb

## 2016-12-28 DIAGNOSIS — M25562 Pain in left knee: Secondary | ICD-10-CM | POA: Diagnosis not present

## 2016-12-28 DIAGNOSIS — G8929 Other chronic pain: Secondary | ICD-10-CM

## 2016-12-28 NOTE — Progress Notes (Signed)
Patient ID:Bryan [name], male DOB:05-30-1956, 61 y.o. WJX:[id number]  Chief Complaint  Patient presents with  . Follow-up    left knee pain    HPI  Bryan [name] is a 61 y.o. male who has had pain of the left knee. He is much improved after the injection last time.  He is walking better, has no swelling or pain.  He has no new trauma.  He is pleased with his progress. HPI  Body mass index is 21.08 kg/m.  ROS  Review of Systems  HENT: Negative for congestion.   Respiratory: Positive for shortness of breath. Negative for cough.   Cardiovascular: Negative for chest pain and leg swelling.  Endocrine: Positive for cold intolerance.  Musculoskeletal: Positive for arthralgias, back pain and gait problem.  Allergic/Immunologic: Positive for environmental allergies.    Past Medical History:  Diagnosis Date  . Acid reflux   . Asthma   . Chronic pain    with leg and back pain (disc problem)  . COPD (chronic obstructive pulmonary disease) ([place])   . History of upper GI x-ray series    to follow showed large duodenal ulcer H pylori serologies were negative  . Hypertension   . Tubular adenoma     Past Surgical History:  Procedure Laterality Date  . BACK SURGERY  7/05; 5/09    Dr.Hirsch,3 lumbar  . BACK SURGERY    . COLONOSCOPY  Feb 2012   Dr. Gala Romney: normal rectum, pedunculate polyp removed but not recovered  . COLONOSCOPY WITH ESOPHAGOGASTRODUODENOSCOPY (EGD) N/A 11/18/2013   Dr.Rourk- tcs= normal rectum, multipe polyps about the ileocecal valve and distal transverse colon o/w the remainder of the colonic mucosa appeared normal bx= tubular adenoma. EGD= normal esophagus, stomach with scattered erosions mottling, friablility, no ulcer or infiltrating process patent pylorus bx= chronic inflammation. next TCS 11/2016  . ESOPHAGOGASTRODUODENOSCOPY  1/06   Dr. Volney American esophageal erosions,U-shaped stomach,marked erosions and edema of the bulb without discrete ulcer disease.   Marland Kitchen NECK  SURGERY  2/09    Family History  Problem Relation Age of Onset  . Cancer Father   . Asthma Mother   . Colon cancer Neg Hx     Social History Social History  Substance Use Topics  . Smoking status: Former Smoker    Packs/day: 0.50    Years: 40.00    Types: Cigarettes    Quit date: 11/11/2014  . Smokeless tobacco: Never Used  . Alcohol use No    No Known Allergies  Current Outpatient Prescriptions  Medication Sig Dispense Refill  . albuterol (PROVENTIL HFA;VENTOLIN HFA) 108 (90 BASE) MCG/ACT inhaler Inhale 1-2 puffs into the lungs every 4 (four) hours as needed for wheezing or shortness of breath. 1 Inhaler 0  . esomeprazole (NEXIUM) 40 MG capsule Take 40 mg by mouth daily.    Marland Kitchen lisinopril-hydrochlorothiazide (PRINZIDE,ZESTORETIC) 10-12.5 MG tablet Take 1 tablet by mouth daily.    . SYMBICORT 160-4.5 MCG/ACT inhaler Inhale 1 puff into the lungs 2 (two) times daily. 1 Inhaler 5  . traMADol (ULTRAM) 50 MG tablet Take 1 tablet (50 mg total) by mouth every 6 (six) hours as needed. 15 tablet 0   No current facility-administered medications for this visit.      Physical Exam  Blood pressure 122/76, pulse 80, temperature 97.3 F (36.3 C), height _0  (1.6 m), weight 119 lb (54 kg).  Constitutional: overall normal hygiene, normal nutrition, well developed, normal grooming, normal body habitus. Assistive device:none  Musculoskeletal: gait and  station Limp none, muscle tone and strength are normal, no tremors or atrophy is present.  .  Neurological: coordination overall normal.  Deep tendon reflex/nerve stretch intact.  Sensation normal.  Cranial nerves II-XII intact.   Skin:   Normal overall no scars, lesions, ulcers or rashes. No psoriasis.  Psychiatric: Alert and oriented x 3.  Recent memory intact, remote memory unclear.  Normal mood and affect. Well groomed.  Good eye contact.  Cardiovascular: overall no swelling, no varicosities, no edema bilaterally, normal temperatures  of the legs and arms, no clubbing, cyanosis and good capillary refill.  Lymphatic: palpation is normal.  The left lower extremity is examined:  Inspection:  Thigh:  Non-tender and no defects  Knee does not have swelling 0 effusion.                        Joint tenderness is present                        Patient is tender over the medial joint line  Lower Leg:  Has normal appearance and no tenderness or defects  Ankle:  Non-tender and no defects  Foot:  Non-tender and no defects Range of Motion:  Knee:  Range of motion is: 0-115                        Crepitus is  present  Ankle:  Range of motion is normal. Strength and Tone:  The left lower extremity has normal strength and tone. Stability:  Knee:  The knee is stable.  Ankle:  The ankle is stable.    The patient has been educated about the nature of the problem(s) and counseled on treatment options.  The patient appeared to understand what I have discussed and is in agreement with it.  Encounter Diagnosis  Name Primary?  . Chronic pain of left knee Yes    PLAN Call if any problems.  Precautions discussed.  Continue current medications.   Return to clinic 6 weeks   Electronically Signed Sanjuana Kava, MD 4/18/201810:28 AM

## 2017-01-18 ENCOUNTER — Encounter: Payer: Self-pay | Admitting: Orthopaedic Surgery

## 2017-01-18 ENCOUNTER — Ambulatory Visit (INDEPENDENT_AMBULATORY_CARE_PROVIDER_SITE_OTHER): Payer: Medicare Other | Admitting: Orthopaedic Surgery

## 2017-01-18 VITALS — BP 113/67 | HR 78 | Temp 97.9°F | Resp 18 | Ht 64.0 in | Wt 115.0 lb

## 2017-01-18 DIAGNOSIS — M25562 Pain in left knee: Secondary | ICD-10-CM

## 2017-01-18 DIAGNOSIS — M25561 Pain in right knee: Secondary | ICD-10-CM | POA: Diagnosis not present

## 2017-01-18 DIAGNOSIS — G8929 Other chronic pain: Secondary | ICD-10-CM

## 2017-01-18 MED ORDER — DICLOFENAC SODIUM 75 MG PO TBEC: 75.0000 mg | DELAYED_RELEASE_TABLET | Freq: Two times a day (BID) | ORAL | 5 refills | Status: DC

## 2017-01-18 NOTE — Progress Notes (Signed)
Patient ID:Bryan [name], male DOB:12/28/1955, 61 y.o. WIO:[id number]  Chief Complaint  Patient presents with  . Follow-up    Recheck left knee.    HPI  Bryan [name] is a 61 y.o. male who has bilateral knee pain.  I saw him for right knee pain last month.  He did well until now.  He has pain and swelling of the knees but no giving way.  He has no trauma.  He has no redness.  He is not taking any NSAID.  I will begin diclofenac. HPI  Body mass index is 19.74 kg/m.  ROS  Review of Systems  HENT: Negative for congestion.   Respiratory: Positive for shortness of breath. Negative for cough.   Cardiovascular: Negative for chest pain and leg swelling.  Endocrine: Positive for cold intolerance.  Musculoskeletal: Positive for arthralgias, back pain and gait problem.  Allergic/Immunologic: Positive for environmental allergies.    Past Medical History:  Diagnosis Date  . Acid reflux   . Asthma   . Chronic pain    with leg and back pain (disc problem)  . COPD (chronic obstructive pulmonary disease) ([place])   . History of upper GI x-ray series    to follow showed large duodenal ulcer H pylori serologies were negative  . Hypertension   . Tubular adenoma     Past Surgical History:  Procedure Laterality Date  . BACK SURGERY  7/05; 5/09    Dr.Hirsch,3 lumbar  . BACK SURGERY    . COLONOSCOPY  Feb 2012   Dr. Gala Romney: normal rectum, pedunculate polyp removed but not recovered  . COLONOSCOPY WITH ESOPHAGOGASTRODUODENOSCOPY (EGD) N/A 11/18/2013   Dr.Rourk- tcs= normal rectum, multipe polyps about the ileocecal valve and distal transverse colon o/w the remainder of the colonic mucosa appeared normal bx= tubular adenoma. EGD= normal esophagus, stomach with scattered erosions mottling, friablility, no ulcer or infiltrating process patent pylorus bx= chronic inflammation. next TCS 11/2016  . ESOPHAGOGASTRODUODENOSCOPY  1/06   Dr. Volney American esophageal erosions,U-shaped stomach,marked erosions and edema  of the bulb without discrete ulcer disease.   Marland Kitchen NECK SURGERY  2/09    Family History  Problem Relation Age of Onset  . Cancer Father   . Asthma Mother   . Colon cancer Neg Hx     Social History Social History  Substance Use Topics  . Smoking status: Former Smoker    Packs/day: 0.50    Years: 40.00    Types: Cigarettes    Quit date: 11/11/2014  . Smokeless tobacco: Never Used  . Alcohol use No    No Known Allergies  Current Outpatient Prescriptions  Medication Sig Dispense Refill  . albuterol (PROVENTIL HFA;VENTOLIN HFA) 108 (90 BASE) MCG/ACT inhaler Inhale 1-2 puffs into the lungs every 4 (four) hours as needed for wheezing or shortness of breath. 1 Inhaler 0  . diclofenac (VOLTAREN) 75 MG EC tablet Take 1 tablet (75 mg total) by mouth 2 (two) times daily. 60 tablet 5  . esomeprazole (NEXIUM) 40 MG capsule Take 40 mg by mouth daily.    Marland Kitchen lisinopril-hydrochlorothiazide (PRINZIDE,ZESTORETIC) 10-12.5 MG tablet Take 1 tablet by mouth daily.    . SYMBICORT 160-4.5 MCG/ACT inhaler Inhale 1 puff into the lungs 2 (two) times daily. 1 Inhaler 5  . traMADol (ULTRAM) 50 MG tablet Take 1 tablet (50 mg total) by mouth every 6 (six) hours as needed. 15 tablet 0   No current facility-administered medications for this visit.      Physical Exam  Blood  pressure 113/67, pulse 78, temperature 97.9 F (36.6 C), resp. rate 18, height _0  (1.626 m), weight 115 lb (52.2 kg).  Constitutional: overall normal hygiene, normal nutrition, well developed, normal grooming, normal body habitus. Assistive device:none  Musculoskeletal: gait and station Limp right, muscle tone and strength are normal, no tremors or atrophy is present.  .  Neurological: coordination overall normal.  Deep tendon reflex/nerve stretch intact.  Sensation normal.  Cranial nerves II-XII intact.   Skin:   Normal overall no scars, lesions, ulcers or rashes. No psoriasis.  Psychiatric: Alert and oriented x 3.  Recent memory  intact, remote memory unclear.  Normal mood and affect. Well groomed.  Good eye contact.  Cardiovascular: overall no swelling, no varicosities, no edema bilaterally, normal temperatures of the legs and arms, no clubbing, cyanosis and good capillary refill.  Lymphatic: palpation is normal.  The bilateral lower extremity is examined:  Inspection:  Thigh:  Non-tender and no defects  Knee has swelling 1+ effusion.                        Joint tenderness is present                        Patient is tender over the medial joint line  Lower Leg:  Has normal appearance and no tenderness or defects  Ankle:  Non-tender and no defects  Foot:  Non-tender and no defects Range of Motion:  Knee:  Range of motion is: 0-105 right, 0-11- left                        Crepitus is  present  Ankle:  Range of motion is normal. Strength and Tone:  The bilateral lower extremity has normal strength and tone. Stability:  Knee:  The knee is stable.  Ankle:  The ankle is stable.    The patient has been educated about the nature of the problem(s) and counseled on treatment options.  The patient appeared to understand what I have discussed and is in agreement with it.  Encounter Diagnoses  Name Primary?  . Chronic pain of left knee Yes  . Chronic pain of right knee     PLAN Call if any problems.  Precautions discussed.  Continue current medications.   Return to clinic 1 month   Begin Diclofenac  Electronically Signed Sanjuana Kava, MD 5/9/20189:48 AM

## 2017-02-08 ENCOUNTER — Ambulatory Visit: Payer: Medicare Other | Admitting: Orthopaedic Surgery

## 2017-02-14 DIAGNOSIS — J4520 Mild intermittent asthma, uncomplicated: Secondary | ICD-10-CM | POA: Diagnosis not present

## 2017-02-14 DIAGNOSIS — K210 Gastro-esophageal reflux disease with esophagitis: Secondary | ICD-10-CM | POA: Diagnosis not present

## 2017-02-14 DIAGNOSIS — I10 Essential (primary) hypertension: Secondary | ICD-10-CM | POA: Diagnosis not present

## 2017-02-14 DIAGNOSIS — J449 Chronic obstructive pulmonary disease, unspecified: Secondary | ICD-10-CM | POA: Diagnosis not present

## 2017-02-15 ENCOUNTER — Ambulatory Visit (INDEPENDENT_AMBULATORY_CARE_PROVIDER_SITE_OTHER): Payer: Medicare Other | Admitting: Orthopaedic Surgery

## 2017-02-15 ENCOUNTER — Ambulatory Visit (INDEPENDENT_AMBULATORY_CARE_PROVIDER_SITE_OTHER): Payer: Medicare Other

## 2017-02-15 VITALS — BP 110/73 | HR 78 | Ht 63.0 in | Wt 116.0 lb

## 2017-02-15 DIAGNOSIS — G8929 Other chronic pain: Secondary | ICD-10-CM

## 2017-02-15 DIAGNOSIS — M5441 Lumbago with sciatica, right side: Secondary | ICD-10-CM

## 2017-02-15 DIAGNOSIS — M5442 Lumbago with sciatica, left side: Secondary | ICD-10-CM | POA: Diagnosis not present

## 2017-02-15 NOTE — Progress Notes (Signed)
Patient ID:Bryan [name], male DOB:1956/06/21, 62 y.o. UEA:[id number]  Chief Complaint  Patient presents with  . Follow-up    Bilateral knee and leg pain    HPI  Bryan [name] is a 61 y.o. male who has been having knee pain.  His knee pain is better today. He complains of back pain with bilateral sciatica more on the right side than the left.  He has no weakness, no trauma, no bowel or bladder problems.  Heat, rest, ice, rubs, tylenol have not helped.   He has had back surgery in the past times three by Dr. Luiz Ochoa in [place]. HPI  Body mass index is 20.55 kg/m.  ROS  Review of Systems  HENT: Negative for congestion.   Respiratory: Positive for shortness of breath. Negative for cough.   Cardiovascular: Negative for chest pain and leg swelling.  Endocrine: Positive for cold intolerance.  Musculoskeletal: Positive for arthralgias, back pain and gait problem.  Allergic/Immunologic: Positive for environmental allergies.    Past Medical History:  Diagnosis Date  . Acid reflux   . Asthma   . Chronic pain    with leg and back pain (disc problem)  . COPD (chronic obstructive pulmonary disease) ([place])   . History of upper GI x-ray series    to follow showed large duodenal ulcer H pylori serologies were negative  . Hypertension   . Tubular adenoma     Past Surgical History:  Procedure Laterality Date  . BACK SURGERY  7/05; 5/09    Dr.Hirsch,3 lumbar  . BACK SURGERY    . COLONOSCOPY  Feb 2012   Dr. Gala Romney: normal rectum, pedunculate polyp removed but not recovered  . COLONOSCOPY WITH ESOPHAGOGASTRODUODENOSCOPY (EGD) N/A 11/18/2013   Dr.Rourk- tcs= normal rectum, multipe polyps about the ileocecal valve and distal transverse colon o/w the remainder of the colonic mucosa appeared normal bx= tubular adenoma. EGD= normal esophagus, stomach with scattered erosions mottling, friablility, no ulcer or infiltrating process patent pylorus bx= chronic inflammation. next TCS 11/2016  .  ESOPHAGOGASTRODUODENOSCOPY  1/06   Dr. Volney American esophageal erosions,U-shaped stomach,marked erosions and edema of the bulb without discrete ulcer disease.   Marland Kitchen NECK SURGERY  2/09    Family History  Problem Relation Age of Onset  . Cancer Father   . Asthma Mother   . Colon cancer Neg Hx     Social History Social History  Substance Use Topics  . Smoking status: Former Smoker    Packs/day: 0.50    Years: 40.00    Types: Cigarettes    Quit date: 11/11/2014  . Smokeless tobacco: Never Used  . Alcohol use No    No Known Allergies  Current Outpatient Prescriptions  Medication Sig Dispense Refill  . albuterol (PROVENTIL HFA;VENTOLIN HFA) 108 (90 BASE) MCG/ACT inhaler Inhale 1-2 puffs into the lungs every 4 (four) hours as needed for wheezing or shortness of breath. 1 Inhaler 0  . diclofenac (VOLTAREN) 75 MG EC tablet Take 1 tablet (75 mg total) by mouth 2 (two) times daily. 60 tablet 5  . esomeprazole (NEXIUM) 40 MG capsule Take 40 mg by mouth daily.    Marland Kitchen lisinopril-hydrochlorothiazide (PRINZIDE,ZESTORETIC) 10-12.5 MG tablet Take 1 tablet by mouth daily.    . SYMBICORT 160-4.5 MCG/ACT inhaler Inhale 1 puff into the lungs 2 (two) times daily. 1 Inhaler 5  . traMADol (ULTRAM) 50 MG tablet Take 1 tablet (50 mg total) by mouth every 6 (six) hours as needed. 15 tablet 0   No current facility-administered  medications for this visit.      Physical Exam  Blood pressure 110/73, pulse 78, height _0  (1.6 m), weight 116 lb (52.6 kg).  Constitutional: overall normal hygiene, normal nutrition, well developed, normal grooming, normal body habitus. Assistive device:none  Musculoskeletal: gait and station Limp none, muscle tone and strength are normal, no tremors or atrophy is present.  .  Neurological: coordination overall normal.  Deep tendon reflex/nerve stretch intact.  Sensation normal.  Cranial nerves II-XII intact.   Skin:   Normal overall no scars, lesions, ulcers or rashes. No  psoriasis.  Psychiatric: Alert and oriented x 3.  Recent memory intact, remote memory unclear.  Normal mood and affect. Well groomed.  Good eye contact.  Cardiovascular: overall no swelling, no varicosities, no edema bilaterally, normal temperatures of the legs and arms, no clubbing, cyanosis and good capillary refill.  Lymphatic: palpation is normal.  Spine/Pelvis examination:  Inspection:  Overall, sacoiliac joint benign and hips nontender; without crepitus or defects.   Thoracic spine inspection: Alignment normal without kyphosis present   Lumbar spine inspection:  Alignment  with normal lumbar lordosis, without scoliosis apparent.   Thoracic spine palpation:  without tenderness of spinal processes   Lumbar spine palpation: with tenderness of lumbar area; without tightness of lumbar muscles    Range of Motion:   Lumbar flexion, forward flexion is 35 with pain or tenderness    Lumbar extension is 10 with pain or tenderness   Left lateral bend is Normal  without pain or tenderness   Right lateral bend is Normal with pain or tenderness   Straight leg raising is Normal   Strength & tone: Normal   Stability overall normal stability   Both knees have slight effusion, ROM bilaterally of 0 to 110, they are stable.  NV intact.  Muscle strength and tone normal.  No limp.  The patient has been educated about the nature of the problem(s) and counseled on treatment options.  The patient appeared to understand what I have discussed and is in agreement with it.  Encounter Diagnosis  Name Primary?  . Chronic bilateral low back pain with bilateral sciatica Yes    PLAN Call if any problems.  Precautions discussed.  Continue current medications.   Return to clinic to see Dr. Luiz Ochoa about back, I will see as needed for his knees.   Electronically Signed Sanjuana Kava, MD 6/6/20189:15 AM

## 2017-03-07 DIAGNOSIS — R109 Unspecified abdominal pain: Secondary | ICD-10-CM | POA: Diagnosis not present

## 2017-03-07 DIAGNOSIS — G8929 Other chronic pain: Secondary | ICD-10-CM | POA: Diagnosis not present

## 2017-03-07 DIAGNOSIS — K219 Gastro-esophageal reflux disease without esophagitis: Secondary | ICD-10-CM | POA: Diagnosis not present

## 2017-03-08 ENCOUNTER — Encounter: Payer: Self-pay | Admitting: Internal Medicine

## 2017-03-28 DIAGNOSIS — K210 Gastro-esophageal reflux disease with esophagitis: Secondary | ICD-10-CM | POA: Diagnosis not present

## 2017-03-28 DIAGNOSIS — J449 Chronic obstructive pulmonary disease, unspecified: Secondary | ICD-10-CM | POA: Diagnosis not present

## 2017-03-28 DIAGNOSIS — J9611 Chronic respiratory failure with hypoxia: Secondary | ICD-10-CM | POA: Diagnosis not present

## 2017-03-28 DIAGNOSIS — M545 Low back pain: Secondary | ICD-10-CM | POA: Diagnosis not present

## 2017-05-01 ENCOUNTER — Encounter: Payer: Self-pay | Admitting: Gastroenterology

## 2017-05-01 ENCOUNTER — Telehealth: Payer: Self-pay

## 2017-05-01 ENCOUNTER — Ambulatory Visit (INDEPENDENT_AMBULATORY_CARE_PROVIDER_SITE_OTHER): Payer: Medicare Other | Admitting: Gastroenterology

## 2017-05-01 ENCOUNTER — Other Ambulatory Visit: Payer: Self-pay

## 2017-05-01 VITALS — BP 119/86 | HR 92 | Temp 97.6°F | Ht 63.0 in | Wt 115.8 lb

## 2017-05-01 DIAGNOSIS — R1013 Epigastric pain: Secondary | ICD-10-CM | POA: Diagnosis not present

## 2017-05-01 DIAGNOSIS — R1319 Other dysphagia: Secondary | ICD-10-CM

## 2017-05-01 DIAGNOSIS — R6881 Early satiety: Secondary | ICD-10-CM

## 2017-05-01 DIAGNOSIS — Z86010 Personal history of colonic polyps: Secondary | ICD-10-CM

## 2017-05-01 DIAGNOSIS — R1310 Dysphagia, unspecified: Secondary | ICD-10-CM

## 2017-05-01 DIAGNOSIS — Z860101 Personal history of adenomatous and serrated colon polyps: Secondary | ICD-10-CM | POA: Insufficient documentation

## 2017-05-01 MED ORDER — NA SULFATE-K SULFATE-MG SULF 17.5-3.13-1.6 GM/177ML PO SOLN: 1.0000 | ORAL | 0 refills | Status: DC

## 2017-05-01 MED ORDER — PANTOPRAZOLE SODIUM 40 MG PO TBEC: 40.0000 mg | DELAYED_RELEASE_TABLET | Freq: Every day | ORAL | 11 refills | Status: DC

## 2017-05-01 NOTE — Assessment & Plan Note (Signed)
61 year old gentleman with recurrent epigastric pain, early satiety, modest weight loss in the setting of NSAID use. Omeprazole started back in June with minimal improvement. Prior history of mottling and friability of the stomach but no ulcer on last EGD in 2016. Suspect gastritis or peptic ulcer disease. Biliary etiology not excluded. Recommend EGD with deep sedation in the OR (intermittent narcotics) in the near future. Consider esophageal dilation given history of esophageal dysphagia.  I have discussed the risks, alternatives, benefits with regards to but not limited to the risk of reaction to medication, bleeding, infection, perforation and the patient is agreeable to proceed. Written consent to be obtained.`  Switch omeprazole to pantoprazole 40 mg daily. Prescription provided.

## 2017-05-01 NOTE — Patient Instructions (Signed)
1. Colonoscopy and upper endoscopy as scheduled. Please see separate instructions. 2. Stop omeprazole. Begin pantoprazole 40 mg 30 minutes before breakfast daily.

## 2017-05-01 NOTE — Progress Notes (Signed)
cc'ed to pcp °

## 2017-05-01 NOTE — Assessment & Plan Note (Signed)
Due for surveillance colonoscopy back in March. We'll go ahead and update at this time. Plan for deep sedation in the OR given history of intermittent narcotic use.  I have discussed the risks, alternatives, benefits with regards to but not limited to the risk of reaction to medication, bleeding, infection, perforation and the patient is agreeable to proceed. Written consent to be obtained.

## 2017-05-01 NOTE — Telephone Encounter (Signed)
Called and informed pt of pre-op appt 05/19/17 at 12:45pm. Letter also mailed.

## 2017-05-01 NOTE — Progress Notes (Signed)
Primary Care Physician: Rosita Fire, MD  Primary Gastroenterologist:  Garfield Cornea, MD   Chief Complaint  Patient presents with  . Abdominal Pain    mid upper abd  . Constipation    HPI: Bryan [name] is a 61 y.o. male hereFor follow-up of abdominal pain. Last seen in 2016.  Several months ago started having bad epigastric pain again. Feels like pressure and needs to burp but can't. Dysphagia to solid foods and sometimes pills. Pain lasts for hours at a time. Described as burning type. Has to just sit down and let it pass. Omeprazole started in June and doesn't seem to be helping. Associated with early satiety. Denies vomiting. Reports a 10 pound weight loss although he notes that his weight does fluctuate. Feels like is not eating as well. BM sometimes pasty, sometimes may go few days in between. Epigastric pain keeps from eating. No melena rectal bleeding. He was not aware that he was due for colonoscopy in March, we did send him a letter. States his address has not changed.  Has appt with pain clinic 05/10/17. PCP giving tramadol only.   Current Outpatient Prescriptions  Medication Sig Dispense Refill  . albuterol (PROVENTIL HFA;VENTOLIN HFA) 108 (90 BASE) MCG/ACT inhaler Inhale 1-2 puffs into the lungs every 4 (four) hours as needed for wheezing or shortness of breath. 1 Inhaler 0  . diclofenac (VOLTAREN) 75 MG EC tablet Take 1 tablet (75 mg total) by mouth 2 (two) times daily. 60 tablet 5  . lisinopril-hydrochlorothiazide (PRINZIDE,ZESTORETIC) 10-12.5 MG tablet Take 1 tablet by mouth daily.    Marland Kitchen omeprazole (PRILOSEC) 40 MG capsule Take 40 mg by mouth daily.     No current facility-administered medications for this visit.     Allergies as of 05/01/2017  . (No Known Allergies)   Past Medical History:  Diagnosis Date  . Acid reflux   . Asthma   . Chronic pain    with leg and back pain (disc problem)  . COPD (chronic obstructive pulmonary disease) ([place])   . History  of upper GI x-ray series    to follow showed large duodenal ulcer H pylori serologies were negative  . Hypertension   . Tubular adenoma    Past Surgical History:  Procedure Laterality Date  . BACK SURGERY  7/05; 5/09    Dr.Hirsch,3 lumbar  . BACK SURGERY    . COLONOSCOPY  Feb 2012   Dr. Gala Romney: normal rectum, pedunculate polyp removed but not recovered  . COLONOSCOPY WITH ESOPHAGOGASTRODUODENOSCOPY (EGD) N/A 11/18/2013   Dr.Rourk- tcs= normal rectum, multipe polyps about the ileocecal valve and distal transverse colon o/w the remainder of the colonic mucosa appeared normal bx= tubular adenoma. EGD= normal esophagus, stomach with scattered erosions mottling, friablility, no ulcer or infiltrating process patent pylorus bx= chronic inflammation. next TCS 11/2016  . ESOPHAGOGASTRODUODENOSCOPY  1/06   Dr. Volney American esophageal erosions,U-shaped stomach,marked erosions and edema of the bulb without discrete ulcer disease.   Marland Kitchen NECK SURGERY  2/09   Family History  Problem Relation Age of Onset  . Cancer Father   . Asthma Mother   . Colon cancer Neg Hx    Social History   Social History  . Marital status: Single    Spouse name: N/A  . Number of children: 2  . Years of education: N/A   Occupational History  . disabled Unemployed   Social History Main Topics  . Smoking status: Former Smoker    Packs/day: 0.50  Years: 40.00    Types: Cigarettes    Quit date: 11/11/2014  . Smokeless tobacco: Never Used  . Alcohol use No  . Drug use: Yes    Types: Marijuana     Comment:  'every once in a while"  . Sexual activity: Not Asked   Other Topics Concern  . None   Social History Narrative   Lives with girlfriend, is on disability for history of back injuries.    ROS:  General: Negative for anorexia, weight loss, fever, chills, fatigue, weakness. ENT: Negative for hoarseness, difficulty swallowing , nasal congestion. CV: Negative for chest pain, angina, palpitations, dyspnea on  exertion, peripheral edema.  Respiratory: Negative for dyspnea at rest, dyspnea on exertion, cough, sputum, wheezing.  GI: See history of present illness. GU:  Negative for dysuria, hematuria, urinary incontinence, urinary frequency, nocturnal urination.  Endo: Negative for unusual weight change.    Physical Examination:   BP 119/86   Pulse 92   Temp 97.6 F (36.4 C) (Oral)   Ht 5' 3" (1.6 m)   Wt 115 lb 12.8 oz (52.5 kg)   BMI 20.51 kg/m   General: Well-nourished, well-developed in no acute distress.  Eyes: No icterus. Mouth: Oropharyngeal mucosa moist and pink , no lesions erythema or exudate. Lungs: Clear to auscultation bilaterally.  Heart: Regular rate and rhythm, no murmurs rubs or gallops.  Abdomen: Bowel sounds are normal, moderate epigastric tenderness, nondistended, no hepatosplenomegaly or masses, no abdominal bruits or hernia , no rebound or guarding.   Extremities: No lower extremity edema. No clubbing or deformities. Neuro: Alert and oriented x 4   Skin: Warm and dry, no jaundice.   Psych: Alert and cooperative, normal mood and affect.    Lab Results  Component Value Date   WBC 7.4 12/01/2016   HGB 13.6 12/01/2016   HCT 39.3 12/01/2016   MCV 87.1 12/01/2016   PLT 257 12/01/2016   Lab Results  Component Value Date   CREATININE 1.10 12/01/2016   BUN 9 12/01/2016   NA 141 12/01/2016   K 3.6 12/01/2016   CL 108 12/01/2016   CO2 27 12/01/2016

## 2017-05-05 ENCOUNTER — Ambulatory Visit (INDEPENDENT_AMBULATORY_CARE_PROVIDER_SITE_OTHER): Payer: Medicare Other | Admitting: Urology

## 2017-05-05 DIAGNOSIS — R350 Frequency of micturition: Secondary | ICD-10-CM | POA: Diagnosis not present

## 2017-05-05 DIAGNOSIS — N403 Nodular prostate with lower urinary tract symptoms: Secondary | ICD-10-CM

## 2017-05-05 DIAGNOSIS — N3941 Urge incontinence: Secondary | ICD-10-CM

## 2017-05-10 DIAGNOSIS — M545 Low back pain: Secondary | ICD-10-CM | POA: Diagnosis not present

## 2017-05-10 DIAGNOSIS — M961 Postlaminectomy syndrome, not elsewhere classified: Secondary | ICD-10-CM | POA: Diagnosis not present

## 2017-05-10 DIAGNOSIS — G894 Chronic pain syndrome: Secondary | ICD-10-CM | POA: Diagnosis not present

## 2017-05-10 DIAGNOSIS — M5416 Radiculopathy, lumbar region: Secondary | ICD-10-CM | POA: Diagnosis not present

## 2017-05-17 NOTE — Patient Instructions (Signed)
Bryan [name]  05/17/2017     _0 @   Your procedure is scheduled on  05/25/2017   Report to [facility] at  700  A.M.  Call this number if you have problems the morning of surgery:  [phone]   Remember:  Do not eat food or drink liquids after midnight.  Take these medicines the morning of surgery with A SIP OF WATER  Voltaren, lisinopril, protonix. Use your inhaler before you come.   Do not wear jewelry, make-up or nail polish.  Do not wear lotions, powders, or perfumes, or deoderant.  Do not shave 48 hours prior to surgery.  Men may shave face and neck.  Do not bring valuables to the hospital.  [facility] is not responsible for any belongings or valuables.  Contacts, dentures or bridgework may not be worn into surgery.  Leave your suitcase in the car.  After surgery it may be brought to your room.  For patients admitted to the hospital, discharge time will be determined by your treatment team.  Patients discharged the day of surgery will not be allowed to drive home.   Name and phone number of your driver:   family Special instructions:  Follow the diet and prep instructions given to you by Dr Roseanne Kaufman office.  Please read over the following fact sheets that you were given. Anesthesia Post-op Instructions and Care and Recovery After Surgery       Esophagogastroduodenoscopy Esophagogastroduodenoscopy (EGD) is a procedure to examine the lining of the esophagus, stomach, and first part of the small intestine (duodenum). This procedure is done to check for problems such as inflammation, bleeding, ulcers, or growths. During this procedure, a long, flexible, lighted tube with a camera attached (endoscope) is inserted down the throat. Tell a health care provider about:  Any allergies you have.  All medicines you are taking, including vitamins, herbs, eye drops, creams, and over-the-counter medicines.  Any problems you or family  members have had with anesthetic medicines.  Any blood disorders you have.  Any surgeries you have had.  Any medical conditions you have.  Whether you are pregnant or may be pregnant. What are the risks? Generally, this is a safe procedure. However, problems may occur, including:  Infection.  Bleeding.  A tear (perforation) in the esophagus, stomach, or duodenum.  Trouble breathing.  Excessive sweating.  Spasms of the larynx.  A slowed heartbeat.  Low blood pressure.  What happens before the procedure?  Follow instructions from your health care provider about eating or drinking restrictions.  Ask your health care provider about: ? Changing or stopping your regular medicines. This is especially important if you are taking diabetes medicines or blood thinners. ? Taking medicines such as aspirin and ibuprofen. These medicines can thin your blood. Do not take these medicines before your procedure if your health care provider instructs you not to.  Plan to have someone take you home after the procedure.  If you wear dentures, be ready to remove them before the procedure. What happens during the procedure?  To reduce your risk of infection, your health care team will wash or sanitize their hands.  An IV tube will be put in a vein in your hand or arm. You will get medicines and fluids through this tube.  You will be given one or more of the following: ? A medicine to help you relax (sedative). ?  A medicine to numb the area (local anesthetic). This medicine may be sprayed into your throat. It will make you feel more comfortable and keep you from gagging or coughing during the procedure. ? A medicine for pain.  A mouth guard may be placed in your mouth to protect your teeth and to keep you from biting on the endoscope.  You will be asked to lie on your left side.  The endoscope will be lowered down your throat into your esophagus, stomach, and duodenum.  Air will be put  into the endoscope. This will help your health care provider see better.  The lining of your esophagus, stomach, and duodenum will be examined.  Your health care provider may: ? Take a tissue sample so it can be looked at in a lab (biopsy). ? Remove growths. ? Remove objects (foreign bodies) that are stuck. ? Treat any bleeding with medicines or other devices that stop tissue from bleeding. ? Widen (dilate) or stretch narrowed areas of your esophagus and stomach.  The endoscope will be taken out. The procedure may vary among health care providers and hospitals. What happens after the procedure?  Your blood pressure, heart rate, breathing rate, and blood oxygen level will be monitored often until the medicines you were given have worn off.  Do not eat or drink anything until the numbing medicine has worn off and your gag reflex has returned. This information is not intended to replace advice given to you by your health care provider. Make sure you discuss any questions you have with your health care provider. Document Released: 12/30/2004 Document Revised: 02/04/2016 Document Reviewed: 07/23/2015 Elsevier Interactive Patient Education  2018 [employer]. Esophagogastroduodenoscopy, Care After Refer to this sheet in the next few weeks. These instructions provide you with information about caring for yourself after your procedure. Your health care provider may also give you more specific instructions. Your treatment has been planned according to current medical practices, but problems sometimes occur. Call your health care provider if you have any problems or questions after your procedure. What can I expect after the procedure? After the procedure, it is common to have:  A sore throat.  Nausea.  Bloating.  Dizziness.  Fatigue.  Follow these instructions at home:  Do not eat or drink anything until the numbing medicine (local anesthetic) has worn off and your gag reflex has  returned. You will know that the local anesthetic has worn off when you can swallow comfortably.  Do not drive for 24 hours if you received a medicine to help you relax (sedative).  If your health care provider took a tissue sample for testing during the procedure, make sure to get your test results. This is your responsibility. Ask your health care provider or the department performing the test when your results will be ready.  Keep all follow-up visits as told by your health care provider. This is important. Contact a health care provider if:  You cannot stop coughing.  You are not urinating.  You are urinating less than usual. Get help right away if:  You have trouble swallowing.  You cannot eat or drink.  You have throat or chest pain that gets worse.  You are dizzy or light-headed.  You faint.  You have nausea or vomiting.  You have chills.  You have a fever.  You have severe abdominal pain.  You have black, tarry, or bloody stools. This information is not intended to replace advice given to you by your health  care provider. Make sure you discuss any questions you have with your health care provider. Document Released: 08/15/2012 Document Revised: 02/04/2016 Document Reviewed: 07/23/2015 Elsevier Interactive Patient Education  2018 [employer].  Esophageal Dilatation Esophageal dilatation is a procedure to open a blocked or narrowed part of the esophagus. The esophagus is the long tube in your throat that carries food and liquid from your mouth to your stomach. The procedure is also called esophageal dilation. You may need this procedure if you have a buildup of scar tissue in your esophagus that makes it difficult, painful, or even impossible to swallow. This can be caused by gastroesophageal reflux disease (GERD). In rare cases, people need this procedure because they have cancer of the esophagus or a problem with the way food moves through the esophagus. Sometimes  you may need to have another dilatation to enlarge the opening of the esophagus gradually. Tell a health care provider about:  Any allergies you have.  All medicines you are taking, including vitamins, herbs, eye drops, creams, and over-the-counter medicines.  Any problems you or family members have had with anesthetic medicines.  Any blood disorders you have.  Any surgeries you have had.  Any medical conditions you have.  Any antibiotic medicines you are required to take before dental procedures. What are the risks? Generally, this is a safe procedure. However, problems can occur and include:  Bleeding from a tear in the lining of the esophagus.  A hole (perforation) in the esophagus.  What happens before the procedure?  Do not eat or drink anything after midnight on the night before the procedure or as directed by your health care provider.  Ask your health care provider about changing or stopping your regular medicines. This is especially important if you are taking diabetes medicines or blood thinners.  Plan to have someone take you home after the procedure. What happens during the procedure?  You will be given a medicine that makes you relaxed and sleepy (sedative).  A medicine may be sprayed or gargled to numb the back of the throat.  Your health care provider can use various instruments to do an esophageal dilatation. During the procedure, the instrument used will be placed in your mouth and passed down into your esophagus. Options include: ? Simple dilators. This instrument is carefully placed in the esophagus to stretch it. ? Guided wire bougies. In this method, a flexible tube (endoscope) is used to insert a wire into the esophagus. The dilator is passed over this wire to enlarge the esophagus. Then the wire is removed. ? Balloon dilators. An endoscope with a small balloon at the end is passed down into the esophagus. Inflating the balloon gently stretches the  esophagus and opens it up. What happens after the procedure?  Your blood pressure, heart rate, breathing rate, and blood oxygen level will be monitored often until the medicines you were given have worn off.  Your throat may feel slightly sore and will probably still feel numb. This will improve slowly over time.  You will not be allowed to eat or drink until the throat numbness has resolved.  If this is a same-day procedure, you may be allowed to go home once you have been able to drink, urinate, and sit on the edge of the bed without nausea or dizziness.  If this is a same-day procedure, you should have a friend or family member with you for the next 24 hours after the procedure. This information is not intended to  replace advice given to you by your health care provider. Make sure you discuss any questions you have with your health care provider. Document Released: 10/20/2005 Document Revised: 02/04/2016 Document Reviewed: 01/08/2014 Elsevier Interactive Patient Education  2017 [place].  Colonoscopy, Adult A colonoscopy is an exam to look at the entire large intestine. During the exam, a lubricated, bendable tube is inserted into the anus and then passed into the rectum, colon, and other parts of the large intestine. A colonoscopy is often done as a part of normal colorectal screening or in response to certain symptoms, such as anemia, persistent diarrhea, abdominal pain, and blood in the stool. The exam can help screen for and diagnose medical problems, including:  Tumors.  Polyps.  Inflammation.  Areas of bleeding.  Tell a health care provider about:  Any allergies you have.  All medicines you are taking, including vitamins, herbs, eye drops, creams, and over-the-counter medicines.  Any problems you or family members have had with anesthetic medicines.  Any blood disorders you have.  Any surgeries you have had.  Any medical conditions you have.  Any problems you  have had passing stool. What are the risks? Generally, this is a safe procedure. However, problems may occur, including:  Bleeding.  A tear in the intestine.  A reaction to medicines given during the exam.  Infection (rare).  What happens before the procedure? Eating and drinking restrictions Follow instructions from your health care provider about eating and drinking, which may include:  A few days before the procedure - follow a low-fiber diet. Avoid nuts, seeds, dried fruit, raw fruits, and vegetables.  1-3 days before the procedure - follow a clear liquid diet. Drink only clear liquids, such as clear broth or bouillon, black coffee or tea, clear juice, clear soft drinks or sports drinks, gelatin dessert, and popsicles. Avoid any liquids that contain red or purple dye.  On the day of the procedure - do not eat or drink anything during the 2 hours before the procedure, or within the time period that your health care provider recommends.  Bowel prep If you were prescribed an oral bowel prep to clean out your colon:  Take it as told by your health care provider. Starting the day before your procedure, you will need to drink a large amount of medicated liquid. The liquid will cause you to have multiple loose stools until your stool is almost clear or light green.  If your skin or anus gets irritated from diarrhea, you may use these to relieve the irritation: ? Medicated wipes, such as adult wet wipes with aloe and vitamin E. ? A skin soothing-product like petroleum jelly.  If you vomit while drinking the bowel prep, take a break for up to 60 minutes and then begin the bowel prep again. If vomiting continues and you cannot take the bowel prep without vomiting, call your health care provider.  General instructions  Ask your health care provider about changing or stopping your regular medicines. This is especially important if you are taking diabetes medicines or blood  thinners.  Plan to have someone take you home from the hospital or clinic. What happens during the procedure?  An IV tube may be inserted into one of your veins.  You will be given medicine to help you relax (sedative).  To reduce your risk of infection: ? Your health care team will wash or sanitize their hands. ? Your anal area will be washed with soap.  You will be  asked to lie on your side with your knees bent.  Your health care provider will lubricate a long, thin, flexible tube. The tube will have a camera and a light on the end.  The tube will be inserted into your anus.  The tube will be gently eased through your rectum and colon.  Air will be delivered into your colon to keep it open. You may feel some pressure or cramping.  The camera will be used to take images during the procedure.  A small tissue sample may be removed from your body to be examined under a microscope (biopsy). If any potential problems are found, the tissue will be sent to a lab for testing.  If small polyps are found, your health care provider may remove them and have them checked for cancer cells.  The tube that was inserted into your anus will be slowly removed. The procedure may vary among health care providers and hospitals. What happens after the procedure?  Your blood pressure, heart rate, breathing rate, and blood oxygen level will be monitored until the medicines you were given have worn off.  Do not drive for 24 hours after the exam.  You may have a small amount of blood in your stool.  You may pass gas and have mild abdominal cramping or bloating due to the air that was used to inflate your colon during the exam.  It is up to you to get the results of your procedure. Ask your health care provider, or the department performing the procedure, when your results will be ready. This information is not intended to replace advice given to you by your health care provider. Make sure you discuss  any questions you have with your health care provider. Document Released: 08/26/2000 Document Revised: 06/29/2016 Document Reviewed: 11/10/2015 Elsevier Interactive Patient Education  2018 [employer].  Colonoscopy, Adult, Care After This sheet gives you information about how to care for yourself after your procedure. Your health care provider may also give you more specific instructions. If you have problems or questions, contact your health care provider. What can I expect after the procedure? After the procedure, it is common to have:  A small amount of blood in your stool for 24 hours after the procedure.  Some gas.  Mild abdominal cramping or bloating.  Follow these instructions at home: General instructions   For the first 24 hours after the procedure: ? Do not drive or use machinery. ? Do not sign important documents. ? Do not drink alcohol. ? Do your regular daily activities at a slower pace than normal. ? Eat soft, easy-to-digest foods. ? Rest often.  Take over-the-counter or prescription medicines only as told by your health care provider.  It is up to you to get the results of your procedure. Ask your health care provider, or the department performing the procedure, when your results will be ready. Relieving cramping and bloating  Try walking around when you have cramps or feel bloated.  Apply heat to your abdomen as told by your health care provider. Use a heat source that your health care provider recommends, such as a moist heat pack or a heating pad. ? Place a towel between your skin and the heat source. ? Leave the heat on for 20-30 minutes. ? Remove the heat if your skin turns bright red. This is especially important if you are unable to feel pain, heat, or cold. You may have a greater risk of getting burned. Eating and drinking  Drink enough fluid to keep your urine clear or pale yellow.  Resume your normal diet as instructed by your health care provider.  Avoid heavy or fried foods that are hard to digest.  Avoid drinking alcohol for as long as instructed by your health care provider. Contact a health care provider if:  You have blood in your stool 2-3 days after the procedure. Get help right away if:  You have more than a small spotting of blood in your stool.  You pass large blood clots in your stool.  Your abdomen is swollen.  You have nausea or vomiting.  You have a fever.  You have increasing abdominal pain that is not relieved with medicine. This information is not intended to replace advice given to you by your health care provider. Make sure you discuss any questions you have with your health care provider. Document Released: 04/12/2004 Document Revised: 05/23/2016 Document Reviewed: 11/10/2015 Elsevier Interactive Patient Education  2018 [place] Anesthesia is a term that refers to techniques, procedures, and medicines that help a person stay safe and comfortable during a medical procedure. Monitored anesthesia care, or sedation, is one type of anesthesia. Your anesthesia specialist may recommend sedation if you will be having a procedure that does not require you to be unconscious, such as:  Cataract surgery.  A dental procedure.  A biopsy.  A colonoscopy.  During the procedure, you may receive a medicine to help you relax (sedative). There are three levels of sedation:  Mild sedation. At this level, you may feel awake and relaxed. You will be able to follow directions.  Moderate sedation. At this level, you will be sleepy. You may not remember the procedure.  Deep sedation. At this level, you will be asleep. You will not remember the procedure.  The more medicine you are given, the deeper your level of sedation will be. Depending on how you respond to the procedure, the anesthesia specialist may change your level of sedation or the type of anesthesia to fit your needs. An anesthesia  specialist will monitor you closely during the procedure. Let your health care provider know about:  Any allergies you have.  All medicines you are taking, including vitamins, herbs, eye drops, creams, and over-the-counter medicines.  Any use of steroids (by mouth or as a cream).  Any problems you or family members have had with sedatives and anesthetic medicines.  Any blood disorders you have.  Any surgeries you have had.  Any medical conditions you have, such as sleep apnea.  Whether you are pregnant or may be pregnant.  Any use of cigarettes, alcohol, or street drugs. What are the risks? Generally, this is a safe procedure. However, problems may occur, including:  Getting too much medicine (oversedation).  Nausea.  Allergic reaction to medicines.  Trouble breathing. If this happens, a breathing tube may be used to help with breathing. It will be removed when you are awake and breathing on your own.  Heart trouble.  Lung trouble.  Before the procedure Staying hydrated Follow instructions from your health care provider about hydration, which may include:  Up to 2 hours before the procedure - you may continue to drink clear liquids, such as water, clear fruit juice, black coffee, and plain tea.  Eating and drinking restrictions Follow instructions from your health care provider about eating and drinking, which may include:  8 hours before the procedure - stop eating heavy meals or foods such as meat, fried foods,  or fatty foods.  6 hours before the procedure - stop eating light meals or foods, such as toast or cereal.  6 hours before the procedure - stop drinking milk or drinks that contain milk.  2 hours before the procedure - stop drinking clear liquids.  Medicines Ask your health care provider about:  Changing or stopping your regular medicines. This is especially important if you are taking diabetes medicines or blood thinners.  Taking medicines such as  aspirin and ibuprofen. These medicines can thin your blood. Do not take these medicines before your procedure if your health care provider instructs you not to.  Tests and exams  You will have a physical exam.  You may have blood tests done to show: ? How well your kidneys and liver are working. ? How well your blood can clot.  General instructions  Plan to have someone take you home from the hospital or clinic.  If you will be going home right after the procedure, plan to have someone with you for 24 hours.  What happens during the procedure?  Your blood pressure, heart rate, breathing, level of pain and overall condition will be monitored.  An IV tube will be inserted into one of your veins.  Your anesthesia specialist will give you medicines as needed to keep you comfortable during the procedure. This may mean changing the level of sedation.  The procedure will be performed. After the procedure  Your blood pressure, heart rate, breathing rate, and blood oxygen level will be monitored until the medicines you were given have worn off.  Do not drive for 24 hours if you received a sedative.  You may: ? Feel sleepy, clumsy, or nauseous. ? Feel forgetful about what happened after the procedure. ? Have a sore throat if you had a breathing tube during the procedure. ? Vomit. This information is not intended to replace advice given to you by your health care provider. Make sure you discuss any questions you have with your health care provider. Document Released: 05/25/2005 Document Revised: 02/05/2016 Document Reviewed: 12/20/2015 Elsevier Interactive Patient Education  2018 [place], Care After These instructions provide you with information about caring for yourself after your procedure. Your health care provider may also give you more specific instructions. Your treatment has been planned according to current medical practices, but problems  sometimes occur. Call your health care provider if you have any problems or questions after your procedure. What can I expect after the procedure? After your procedure, it is common to:  Feel sleepy for several hours.  Feel clumsy and have poor balance for several hours.  Feel forgetful about what happened after the procedure.  Have poor judgment for several hours.  Feel nauseous or vomit.  Have a sore throat if you had a breathing tube during the procedure.  Follow these instructions at home: For at least 24 hours after the procedure:   Do not: ? Participate in activities in which you could fall or become injured. ? Drive. ? Use heavy machinery. ? Drink alcohol. ? Take sleeping pills or medicines that cause drowsiness. ? Make important decisions or sign legal documents. ? Take care of children on your own.  Rest. Eating and drinking  Follow the diet that is recommended by your health care provider.  If you vomit, drink water, juice, or soup when you can drink without vomiting.  Make sure you have little or no nausea before eating solid foods. General instructions  Have  a responsible adult stay with you until you are awake and alert.  Take over-the-counter and prescription medicines only as told by your health care provider.  If you smoke, do not smoke without supervision.  Keep all follow-up visits as told by your health care provider. This is important. Contact a health care provider if:  You keep feeling nauseous or you keep vomiting.  You feel light-headed.  You develop a rash.  You have a fever. Get help right away if:  You have trouble breathing. This information is not intended to replace advice given to you by your health care provider. Make sure you discuss any questions you have with your health care provider. Document Released: 12/20/2015 Document Revised: 04/20/2016 Document Reviewed: 12/20/2015 Elsevier Interactive Patient Education  [employer].

## 2017-05-19 ENCOUNTER — Encounter (HOSPITAL_COMMUNITY)
Admission: RE | Admit: 2017-05-19 | Discharge: 2017-05-19 | Disposition: A | Discharge status: Home / Self Care | Payer: Medicare Other | Source: Ambulatory Visit | Attending: Internal Medicine | Admitting: Internal Medicine

## 2017-05-19 ENCOUNTER — Encounter (HOSPITAL_COMMUNITY): Payer: Self-pay

## 2017-05-19 DIAGNOSIS — Z9889 Other specified postprocedural states: Secondary | ICD-10-CM | POA: Insufficient documentation

## 2017-05-19 DIAGNOSIS — R1010 Upper abdominal pain, unspecified: Secondary | ICD-10-CM | POA: Insufficient documentation

## 2017-05-19 DIAGNOSIS — Z01812 Encounter for preprocedural laboratory examination: Secondary | ICD-10-CM | POA: Insufficient documentation

## 2017-05-19 DIAGNOSIS — I10 Essential (primary) hypertension: Secondary | ICD-10-CM | POA: Insufficient documentation

## 2017-05-19 DIAGNOSIS — Z87891 Personal history of nicotine dependence: Secondary | ICD-10-CM | POA: Insufficient documentation

## 2017-05-19 DIAGNOSIS — J449 Chronic obstructive pulmonary disease, unspecified: Secondary | ICD-10-CM | POA: Diagnosis not present

## 2017-05-19 DIAGNOSIS — K5900 Constipation, unspecified: Secondary | ICD-10-CM | POA: Diagnosis not present

## 2017-05-19 HISTORY — DX: Unspecified osteoarthritis, unspecified site: M19.90

## 2017-05-19 LAB — BASIC METABOLIC PANEL
Anion gap: 10 (ref 5–15)
BUN: 13 mg/dL (ref 6–20)
CHLORIDE: 99 mmol/L — AB (ref 101–111)
CO2: 28 mmol/L (ref 22–32)
CREATININE: 1.37 mg/dL — AB (ref 0.61–1.24)
Calcium: 9 mg/dL (ref 8.9–10.3)
GFR calc Af Amer: >60 mL/min (ref >60)
GFR calc non Af Amer: 54 mL/min — ABNORMAL LOW (ref >60)
GLUCOSE: 119 mg/dL — AB (ref 65–99)
Potassium: 3.1 mmol/L — ABNORMAL LOW (ref 3.5–5.1)
SODIUM: 137 mmol/L (ref 135–145)

## 2017-05-19 LAB — CBC WITH DIFFERENTIAL/PLATELET
Basophils Absolute: 0.1 10*3/uL (ref 0.0–0.1)
Basophils Relative: 1 %
EOS ABS: 0.2 10*3/uL (ref 0.0–0.7)
EOS PCT: 2 %
HCT: 42.9 % (ref 39.0–52.0)
HEMOGLOBIN: 14.6 g/dL (ref 13.0–17.0)
Lymphocytes Relative: 29 %
Lymphs Abs: 2.2 10*3/uL (ref 0.7–4.0)
MCH: 30.2 pg (ref 26.0–34.0)
MCHC: 34 g/dL (ref 30.0–36.0)
MCV: 88.6 fL (ref 78.0–100.0)
MONOS PCT: 11 %
Monocytes Absolute: 0.8 10*3/uL (ref 0.1–1.0)
NEUTROS PCT: 57 %
Neutro Abs: 4.3 10*3/uL (ref 1.7–7.7)
Platelets: 283 10*3/uL (ref 150–400)
RBC: 4.84 MIL/uL (ref 4.22–5.81)
RDW: 14.7 % (ref 11.5–15.5)
WBC: 7.6 10*3/uL (ref 4.0–10.5)

## 2017-05-21 NOTE — Progress Notes (Signed)
Please send in RX for KCL 45mq po bid for 3 days. #6. No refills. Have patient follow up with PCP regarding abnormal creatinine.

## 2017-05-22 ENCOUNTER — Other Ambulatory Visit: Payer: Self-pay | Admitting: Gastroenterology

## 2017-05-22 NOTE — Pre-Procedure Instructions (Signed)
Potassium of 3.1 shown to Dr Patsey Berthold. Dr Gala Romney notified as well. Rx for potassium called in for patient. Will recheck potassium morning of surgery.

## 2017-05-22 NOTE — Progress Notes (Signed)
Pt is aware. Rx called to [facility]. Pt is aware to follow up with PCP for the abnormal creatinine. Manuela Schwartz, please fax copy of the lab to Dr. Legrand Rams.

## 2017-05-22 NOTE — Pre-Procedure Instructions (Signed)
Called Bryan [name] to notify him of potassium Rx to be called in. He verbalized understanding to go pick up and start taking Rx.

## 2017-05-25 ENCOUNTER — Ambulatory Visit (HOSPITAL_COMMUNITY): Payer: Medicare Other | Admitting: Anesthesiology

## 2017-05-25 ENCOUNTER — Encounter (HOSPITAL_COMMUNITY)
Admission: RE | Disposition: A | Discharge status: Home / Self Care | Payer: Self-pay | Source: Ambulatory Visit | Attending: Internal Medicine

## 2017-05-25 ENCOUNTER — Ambulatory Visit (HOSPITAL_COMMUNITY)
Admission: RE | Admit: 2017-05-25 | Discharge: 2017-05-25 | Disposition: A | Discharge status: Home / Self Care | Payer: Medicare Other | Source: Ambulatory Visit | Attending: Internal Medicine | Admitting: Internal Medicine

## 2017-05-25 ENCOUNTER — Encounter (HOSPITAL_COMMUNITY): Payer: Self-pay

## 2017-05-25 DIAGNOSIS — M549 Dorsalgia, unspecified: Secondary | ICD-10-CM | POA: Insufficient documentation

## 2017-05-25 DIAGNOSIS — K5900 Constipation, unspecified: Secondary | ICD-10-CM | POA: Diagnosis not present

## 2017-05-25 DIAGNOSIS — Z791 Long term (current) use of non-steroidal anti-inflammatories (NSAID): Secondary | ICD-10-CM | POA: Insufficient documentation

## 2017-05-25 DIAGNOSIS — K209 Esophagitis, unspecified: Secondary | ICD-10-CM

## 2017-05-25 DIAGNOSIS — R1310 Dysphagia, unspecified: Secondary | ICD-10-CM | POA: Insufficient documentation

## 2017-05-25 DIAGNOSIS — Z86010 Personal history of colonic polyps: Secondary | ICD-10-CM | POA: Diagnosis not present

## 2017-05-25 DIAGNOSIS — Z79899 Other long term (current) drug therapy: Secondary | ICD-10-CM | POA: Diagnosis not present

## 2017-05-25 DIAGNOSIS — I10 Essential (primary) hypertension: Secondary | ICD-10-CM | POA: Diagnosis not present

## 2017-05-25 DIAGNOSIS — K210 Gastro-esophageal reflux disease with esophagitis: Secondary | ICD-10-CM | POA: Diagnosis not present

## 2017-05-25 DIAGNOSIS — Z87891 Personal history of nicotine dependence: Secondary | ICD-10-CM | POA: Diagnosis not present

## 2017-05-25 DIAGNOSIS — K5520 Angiodysplasia of colon without hemorrhage: Secondary | ICD-10-CM | POA: Diagnosis not present

## 2017-05-25 DIAGNOSIS — R6881 Early satiety: Secondary | ICD-10-CM | POA: Insufficient documentation

## 2017-05-25 DIAGNOSIS — K5730 Diverticulosis of large intestine without perforation or abscess without bleeding: Secondary | ICD-10-CM | POA: Diagnosis not present

## 2017-05-25 DIAGNOSIS — J449 Chronic obstructive pulmonary disease, unspecified: Secondary | ICD-10-CM | POA: Diagnosis not present

## 2017-05-25 DIAGNOSIS — K621 Rectal polyp: Secondary | ICD-10-CM | POA: Insufficient documentation

## 2017-05-25 DIAGNOSIS — M79606 Pain in leg, unspecified: Secondary | ICD-10-CM | POA: Insufficient documentation

## 2017-05-25 DIAGNOSIS — R1013 Epigastric pain: Secondary | ICD-10-CM | POA: Diagnosis not present

## 2017-05-25 DIAGNOSIS — R109 Unspecified abdominal pain: Secondary | ICD-10-CM | POA: Diagnosis present

## 2017-05-25 DIAGNOSIS — G8929 Other chronic pain: Secondary | ICD-10-CM | POA: Diagnosis not present

## 2017-05-25 DIAGNOSIS — R1319 Other dysphagia: Secondary | ICD-10-CM

## 2017-05-25 DIAGNOSIS — K635 Polyp of colon: Secondary | ICD-10-CM | POA: Diagnosis not present

## 2017-05-25 HISTORY — PX: COLONOSCOPY WITH PROPOFOL: SHX5780

## 2017-05-25 HISTORY — PX: MALONEY DILATION: SHX5535

## 2017-05-25 HISTORY — PX: POLYPECTOMY: SHX5525

## 2017-05-25 HISTORY — PX: ESOPHAGOGASTRODUODENOSCOPY (EGD) WITH PROPOFOL: SHX5813

## 2017-05-25 LAB — POCT I-STAT 4, (NA,K, GLUC, HGB,HCT)
GLUCOSE: 85 mg/dL (ref 65–99)
HEMATOCRIT: 48 % (ref 39.0–52.0)
Hemoglobin: 16.3 g/dL (ref 13.0–17.0)
POTASSIUM: 5 mmol/L (ref 3.5–5.1)
Sodium: 136 mmol/L (ref 135–145)

## 2017-05-25 SURGERY — COLONOSCOPY WITH PROPOFOL
Anesthesia: Monitor Anesthesia Care

## 2017-05-25 MED ORDER — PROPOFOL 500 MG/50ML IV EMUL
INTRAVENOUS | Status: DC | PRN
  Administered 2017-05-25: 75 ug/kg/min via INTRAVENOUS

## 2017-05-25 MED ORDER — PROPOFOL 10 MG/ML IV BOLUS
INTRAVENOUS | Status: DC | PRN
  Administered 2017-05-25 (×5): 10 mg via INTRAVENOUS

## 2017-05-25 MED ORDER — FENTANYL CITRATE (PF) 100 MCG/2ML IJ SOLN
INTRAMUSCULAR | Status: AC
  Filled 2017-05-25: qty 2

## 2017-05-25 MED ORDER — CHLORHEXIDINE GLUCONATE CLOTH 2 % EX PADS: 6.0000 | MEDICATED_PAD | Freq: Once | CUTANEOUS | Status: DC

## 2017-05-25 MED ORDER — LIDOCAINE VISCOUS 2 % MT SOLN
15.0000 mL | Freq: Once | OROMUCOSAL | Status: AC
  Administered 2017-05-25: 15 mL via OROMUCOSAL

## 2017-05-25 MED ORDER — LACTATED RINGERS IV SOLN
INTRAVENOUS | Status: DC
  Administered 2017-05-25: 08:00:00 via INTRAVENOUS

## 2017-05-25 MED ORDER — LIDOCAINE VISCOUS 2 % MT SOLN
OROMUCOSAL | Status: AC
  Filled 2017-05-25: qty 15

## 2017-05-25 MED ORDER — MIDAZOLAM HCL 2 MG/2ML IJ SOLN
1.0000 mg | INTRAMUSCULAR | Status: AC
  Administered 2017-05-25: 2 mg via INTRAVENOUS

## 2017-05-25 MED ORDER — MIDAZOLAM HCL 2 MG/2ML IJ SOLN
INTRAMUSCULAR | Status: AC
  Filled 2017-05-25: qty 2

## 2017-05-25 MED ORDER — FENTANYL CITRATE (PF) 100 MCG/2ML IJ SOLN
25.0000 ug | Freq: Once | INTRAMUSCULAR | Status: AC
  Administered 2017-05-25: 25 ug via INTRAVENOUS

## 2017-05-25 MED ORDER — PROPOFOL 10 MG/ML IV BOLUS
INTRAVENOUS | Status: AC
Start: 2017-05-25 — End: 2017-05-25
  Filled 2017-05-25: qty 80

## 2017-05-25 NOTE — H&P (View-Only) (Signed)
Primary Care Physician: Rosita Fire, MD  Primary Gastroenterologist:  Garfield Cornea, MD   Chief Complaint  Patient presents with  . Abdominal Pain    mid upper abd  . Constipation    HPI: Bryan [name] is a 61 y.o. male hereFor follow-up of abdominal pain. Last seen in 2016.  Several months ago started having bad epigastric pain again. Feels like pressure and needs to burp but can't. Dysphagia to solid foods and sometimes pills. Pain lasts for hours at a time. Described as burning type. Has to just sit down and let it pass. Omeprazole started in June and doesn't seem to be helping. Associated with early satiety. Denies vomiting. Reports a 10 pound weight loss although he notes that his weight does fluctuate. Feels like is not eating as well. BM sometimes pasty, sometimes may go few days in between. Epigastric pain keeps from eating. No melena rectal bleeding. He was not aware that he was due for colonoscopy in March, we did send him a letter. States his address has not changed.  Has appt with pain clinic 05/10/17. PCP giving tramadol only.   Current Outpatient Prescriptions  Medication Sig Dispense Refill  . albuterol (PROVENTIL HFA;VENTOLIN HFA) 108 (90 BASE) MCG/ACT inhaler Inhale 1-2 puffs into the lungs every 4 (four) hours as needed for wheezing or shortness of breath. 1 Inhaler 0  . diclofenac (VOLTAREN) 75 MG EC tablet Take 1 tablet (75 mg total) by mouth 2 (two) times daily. 60 tablet 5  . lisinopril-hydrochlorothiazide (PRINZIDE,ZESTORETIC) 10-12.5 MG tablet Take 1 tablet by mouth daily.    Marland Kitchen omeprazole (PRILOSEC) 40 MG capsule Take 40 mg by mouth daily.     No current facility-administered medications for this visit.     Allergies as of 05/01/2017  . (No Known Allergies)   Past Medical History:  Diagnosis Date  . Acid reflux   . Asthma   . Chronic pain    with leg and back pain (disc problem)  . COPD (chronic obstructive pulmonary disease) ([place])   . History  of upper GI x-ray series    to follow showed large duodenal ulcer H pylori serologies were negative  . Hypertension   . Tubular adenoma    Past Surgical History:  Procedure Laterality Date  . BACK SURGERY  7/05; 5/09    Dr.Hirsch,3 lumbar  . BACK SURGERY    . COLONOSCOPY  Feb 2012   Dr. Gala Romney: normal rectum, pedunculate polyp removed but not recovered  . COLONOSCOPY WITH ESOPHAGOGASTRODUODENOSCOPY (EGD) N/A 11/18/2013   Dr.Rourk- tcs= normal rectum, multipe polyps about the ileocecal valve and distal transverse colon o/w the remainder of the colonic mucosa appeared normal bx= tubular adenoma. EGD= normal esophagus, stomach with scattered erosions mottling, friablility, no ulcer or infiltrating process patent pylorus bx= chronic inflammation. next TCS 11/2016  . ESOPHAGOGASTRODUODENOSCOPY  1/06   Dr. Volney American esophageal erosions,U-shaped stomach,marked erosions and edema of the bulb without discrete ulcer disease.   Marland Kitchen NECK SURGERY  2/09   Family History  Problem Relation Age of Onset  . Cancer Father   . Asthma Mother   . Colon cancer Neg Hx    Social History   Social History  . Marital status: Single    Spouse name: N/A  . Number of children: 2  . Years of education: N/A   Occupational History  . disabled Unemployed   Social History Main Topics  . Smoking status: Former Smoker    Packs/day: 0.50  Years: 40.00    Types: Cigarettes    Quit date: 11/11/2014  . Smokeless tobacco: Never Used  . Alcohol use No  . Drug use: Yes    Types: Marijuana     Comment:  'every once in a while"  . Sexual activity: Not Asked   Other Topics Concern  . None   Social History Narrative   Lives with girlfriend, is on disability for history of back injuries.    ROS:  General: Negative for anorexia, weight loss, fever, chills, fatigue, weakness. ENT: Negative for hoarseness, difficulty swallowing , nasal congestion. CV: Negative for chest pain, angina, palpitations, dyspnea on  exertion, peripheral edema.  Respiratory: Negative for dyspnea at rest, dyspnea on exertion, cough, sputum, wheezing.  GI: See history of present illness. GU:  Negative for dysuria, hematuria, urinary incontinence, urinary frequency, nocturnal urination.  Endo: Negative for unusual weight change.    Physical Examination:   BP 119/86   Pulse 92   Temp 97.6 F (36.4 C) (Oral)   Ht 5' 3" (1.6 m)   Wt 115 lb 12.8 oz (52.5 kg)   BMI 20.51 kg/m   General: Well-nourished, well-developed in no acute distress.  Eyes: No icterus. Mouth: Oropharyngeal mucosa moist and pink , no lesions erythema or exudate. Lungs: Clear to auscultation bilaterally.  Heart: Regular rate and rhythm, no murmurs rubs or gallops.  Abdomen: Bowel sounds are normal, moderate epigastric tenderness, nondistended, no hepatosplenomegaly or masses, no abdominal bruits or hernia , no rebound or guarding.   Extremities: No lower extremity edema. No clubbing or deformities. Neuro: Alert and oriented x 4   Skin: Warm and dry, no jaundice.   Psych: Alert and cooperative, normal mood and affect.    Lab Results  Component Value Date   WBC 7.4 12/01/2016   HGB 13.6 12/01/2016   HCT 39.3 12/01/2016   MCV 87.1 12/01/2016   PLT 257 12/01/2016   Lab Results  Component Value Date   CREATININE 1.10 12/01/2016   BUN 9 12/01/2016   NA 141 12/01/2016   K 3.6 12/01/2016   CL 108 12/01/2016   CO2 27 12/01/2016

## 2017-05-25 NOTE — Anesthesia Postprocedure Evaluation (Signed)
Anesthesia Post Note  Patient: Bryan [name]  Procedure(s) Performed: Procedure(s) (LRB): COLONOSCOPY WITH PROPOFOL (N/A) ESOPHAGOGASTRODUODENOSCOPY (EGD) WITH PROPOFOL (N/A) MALONEY DILATION (N/A) POLYPECTOMY  Patient location during evaluation: Short Stay Anesthesia Type: MAC Level of consciousness: awake and alert Pain management: satisfactory to patient Vital Signs Assessment: post-procedure vital signs reviewed and stable Respiratory status: spontaneous breathing Cardiovascular status: stable Postop Assessment: no signs of nausea or vomiting Anesthetic complications: no     Last Vitals:  Vitals:   05/25/17 0930 05/25/17 0955  BP: (!) 125/95 138/87  Pulse: 82 81  Resp: (!) 30 18  Temp:  (!) 36.3 C  SpO2: 97% 94%    Last Pain:  Vitals:   05/25/17 0955  TempSrc: Oral                 [name]

## 2017-05-25 NOTE — Op Note (Signed)
[facility] Patient Name: Bryan [name] Procedure Date: 05/25/2017 7:08 AM MRN: [id number] Date of Birth: [birth year] Attending MD: Norvel Richards , MD CSN: [id number] Age: 61 Admit Type: Outpatient Procedure:                Upper GI endoscopy Indications:              Epigastric abdominal pain Providers:                Norvel Richards, MD, Lurline Del, RN, Aram Candela Referring MD:              Medicines:                Propofol per Anesthesia Complications:            No immediate complications. Estimated Blood Loss:     Estimated blood loss: none. Procedure:                Pre-Anesthesia Assessment:                           - Prior to the procedure, a History and Physical                            was performed, and patient medications and                            allergies were reviewed. The patient's tolerance of                            previous anesthesia was also reviewed. The risks                            and benefits of the procedure and the sedation                            options and risks were discussed with the patient.                            All questions were answered, and informed consent                            was obtained. Prior Anticoagulants: The patient has                            taken no previous anticoagulant or antiplatelet                            agents. ASA Grade Assessment: II - A patient with                            mild systemic disease. After reviewing the risks  and benefits, the patient was deemed in                            satisfactory condition to undergo the procedure.                           After obtaining informed consent, the endoscope was                            passed under direct vision. Throughout the                            procedure, the patient's blood pressure, pulse, and                            oxygen saturations were monitored  continuously. The                            [id number]) scope was introduced through the                            mouth, and advanced to the second part of duodenum.                            The upper GI endoscopy was accomplished without                            difficulty. The patient tolerated the procedure                            well. Scope In: 8:39:56 AM Scope Out: 8:48:02 AM Total Procedure Duration: 0 hours 8 minutes 6 seconds  Findings:      Reflux Esophagitis was found - 5?5 mm area of erosion at the GE       junction. No Barrett's epithelium seen. Tubular esophagus appearpatent       throughout its course..      The entire examined stomach was normal.      The duodenal bulb and second portion of the duodenum were normal. The       scope was withdrawn. Dilation was performed with a Maloney dilator with       no resistance at 56 Fr. The dilation site was examined following       endoscope reinsertion and showed no change. Estimated blood loss: none. Impression:               - Reflux Esophagitis. Dilated.                           - Normal stomach.                           - Normal duodenal bulb and second portion of the                            duodenum.                           -  No specimens collected. Moderate Sedation:      Moderate (conscious) sedation was personally administered by an       anesthesia professional. The following parameters were monitored: oxygen       saturation, heart rate, blood pressure, respiratory rate, EKG, adequacy       of pulmonary ventilation, and response to care. Total physician       intraservice time was 15 minutes. Recommendation:           - Patient has a contact number available for                            emergencies. The signs and symptoms of potential                            delayed complications were discussed with the                            patient. Return to normal activities tomorrow.                             Written discharge instructions were provided to the                            patient.                           - Resume previous diet.                           - Continue present medications.                           - No repeat upper endoscopy.                           - Return to GI office in 1 month. see colonoscopy                            report. Procedure Code(s):        --- Professional ---                           [phone], Esophagogastroduodenoscopy, flexible,                            transoral; diagnostic, including collection of                            specimen(s) by brushing or washing, when performed                            (separate procedure)                           [id number], Dilation of esophagus, by unguided sound or  bougie, single or multiple passes Diagnosis Code(s):        --- Professional ---                           K20.9, Esophagitis, unspecified                           R10.13, Epigastric pain CPT copyright 2016 American Medical Association. All rights reserved. The codes documented in this report are preliminary and upon coder review may  be revised to meet current compliance requirements. Cristopher Estimable. Raden Byington, MD Norvel Richards, MD 05/25/2017 9:10:21 AM This report has been signed electronically. Number of Addenda: 0

## 2017-05-25 NOTE — Anesthesia Preprocedure Evaluation (Signed)
Anesthesia Evaluation  Patient identified by MRN, date of birth, ID band Patient awake    Reviewed: Allergy & Precautions, NPO status , Patient's Chart, lab work & pertinent test results  Airway Mallampati: II  TM Distance: >3 FB     Dental  (+) Poor Dentition, Missing   Pulmonary asthma , COPD, former smoker,    breath sounds clear to auscultation       Cardiovascular hypertension, Pt. on medications  Rhythm:Regular Rate:Normal     Neuro/Psych    GI/Hepatic GERD  ,(+)     substance abuse  alcohol use,   Endo/Other    Renal/GU Renal disease     Musculoskeletal   Abdominal   Peds  Hematology   Anesthesia Other Findings   Reproductive/Obstetrics                             Anesthesia Physical Anesthesia Plan  ASA: III  Anesthesia Plan: MAC   Post-op Pain Management:    Induction: Intravenous  PONV Risk Score and Plan:   Airway Management Planned: Simple Face Mask  Additional Equipment:   Intra-op Plan:   Post-operative Plan:   Informed Consent: I have reviewed the patients History and Physical, chart, labs and discussed the procedure including the risks, benefits and alternatives for the proposed anesthesia with the patient or authorized representative who has indicated his/her understanding and acceptance.     Plan Discussed with:   Anesthesia Plan Comments:         Anesthesia Quick Evaluation

## 2017-05-25 NOTE — Transfer of Care (Signed)
Immediate Anesthesia Transfer of Care Note  Patient: Bryan [name]  Procedure(s) Performed: Procedure(s) with comments: COLONOSCOPY WITH PROPOFOL (N/A) - 8:30am ESOPHAGOGASTRODUODENOSCOPY (EGD) WITH PROPOFOL (N/A) MALONEY DILATION (N/A) POLYPECTOMY - rectal  Patient Location: PACU  Anesthesia Type:MAC  Level of Consciousness: awake and alert   Airway & Oxygen Therapy: Patient Spontanous Breathing and non-rebreather face mask  Post-op Assessment: Report given to RN and Post -op Vital signs reviewed and stable  Post vital signs: Reviewed and stable  Last Vitals:  Vitals:   05/25/17 0730 05/25/17 0800  BP: (!) 136/91 (!) 149/98  Pulse:    Resp:  (!) 36  Temp:    SpO2:  97%    Last Pain:  Vitals:   05/25/17 0712  TempSrc: Oral         Complications: No apparent anesthesia complications

## 2017-05-25 NOTE — Op Note (Signed)
[facility] Patient Name: Bryan [name] Procedure Date: 05/25/2017 8:48 AM MRN: [id number] Date of Birth: June 18, 1956 Attending MD: Norvel Richards , MD CSN: [id number] Age: 61 Admit Type: Outpatient Procedure:                Colonoscopy Indications:              High risk colon cancer surveillance: Personal                            history of colonic polyps Providers:                Norvel Richards, MD, Lurline Del, RN, Aram Candela Referring MD:              Medicines:                Propofol per Anesthesia Complications:            No immediate complications. Estimated Blood Loss:     Estimated blood loss was minimal. Procedure:                Pre-Anesthesia Assessment:                           - Prior to the procedure, a History and Physical                            was performed, and patient medications and                            allergies were reviewed. The patient's tolerance of                            previous anesthesia was also reviewed. The risks                            and benefits of the procedure and the sedation                            options and risks were discussed with the patient.                            All questions were answered, and informed consent                            was obtained. Prior Anticoagulants: The patient has                            taken no previous anticoagulant or antiplatelet                            agents. ASA Grade Assessment: II - A patient with  mild systemic disease. After reviewing the risks                            and benefits, the patient was deemed in                            satisfactory condition to undergo the procedure.                           After obtaining informed consent, the colonoscope                            was passed under direct vision. Throughout the                            procedure, the patient's blood pressure,  pulse, and                            oxygen saturations were monitored continuously. The                            Colonoscope was introduced through the anus and                            advanced to the the cecum, identified by                            appendiceal orifice and ileocecal valve. The                            ileocecal valve, appendiceal orifice, and rectum                            were photographed. The ileocecal valve, appendiceal                            orifice, and rectum were photographed. The entire                            colon was well visualized. The colonoscopy was                            performed without difficulty. The patient tolerated                            the procedure well. The quality of the bowel                            preparation was adequate. Scope In: 8:53:33 AM Scope Out: 9:05:17 AM Scope Withdrawal Time: 0 hours 8 minutes 48 seconds  Total Procedure Duration: 0 hours 11 minutes 44 seconds  Findings:      The perianal and digital rectal examinations were normal.      Scattered small-mouthed diverticula were found in the sigmoid colon and  descending colon.      A 4 mm polyp was found in the rectum. The polyp was sessile. The polyp       was removed with a cold snare. Resection and retrieval were complete.       Estimated blood loss was minimal. (2) - 5 mm AVMs in the ascending colon      The exam was otherwise without abnormality on direct and retroflexion       views. Impression:               - Diverticulosis in the sigmoid colon and in the                            descending colon.                           - One 4 mm polyp in the rectum, removed with a cold                            snare. Resected and retrieved.                           - The examination was otherwise normal on direct                            and retroflexion views. Ascending colon AVMs. Moderate Sedation:      Moderate (conscious)  sedation was personally administered by an       anesthesia professional. The following parameters were monitored: oxygen       saturation, heart rate, blood pressure, respiratory rate, EKG, adequacy       of pulmonary ventilation, and response to care. Total physician       intraservice time was 31 minutes. Recommendation:           - Patient has a contact number available for                            emergencies. The signs and symptoms of potential                            delayed complications were discussed with the                            patient. Return to normal activities tomorrow.                            Written discharge instructions were provided to the                            patient.                           - Resume previous diet.                           - Continue present medications.                           -  Repeat colonoscopy date to be determined after                            pending pathology results are reviewed for                            surveillance based on pathology results.                           - Return to GI clinic in 1 month. Procedure Code(s):        --- Professional ---                           [phone], Colonoscopy, flexible; with removal of                            tumor(s), polyp(s), or other lesion(s) by snare                            technique Diagnosis Code(s):        --- Professional ---                           K62.1, Rectal polyp                           K57.30, Diverticulosis of large intestine without                            perforation or abscess without bleeding CPT copyright 2016 American Medical Association. All rights reserved. The codes documented in this report are preliminary and upon coder review may  be revised to meet current compliance requirements. Cristopher Estimable. Tonyia Marschall, MD Norvel Richards, MD 05/25/2017 9:17:10 AM This report has been signed electronically. Number of Addenda: 0

## 2017-05-25 NOTE — Interval H&P Note (Signed)
History and Physical Interval Note:  05/25/2017 8:22 AM  Bryan [name]  has presented today for surgery, with the diagnosis of epigastric pain, early satiety, colon polyps, esophageal dysphagia  The various methods of treatment have been discussed with the patient and family. After consideration of risks, benefits and other options for treatment, the patient has consented to  Procedure(s) with comments: COLONOSCOPY WITH PROPOFOL (N/A) - 8:30am ESOPHAGOGASTRODUODENOSCOPY (EGD) WITH PROPOFOL (N/A) MALONEY DILATION (N/A) as a surgical intervention .  The patient's history has been reviewed, patient examined, no change in status, stable for surgery.  I have reviewed the patient's chart and labs.  Questions were answered to the patient's satisfaction.     Bryan [name]  No change. EGD with possible ED and colonoscopy per plan.  The risks, benefits, limitations, imponderables and alternatives regarding both EGD and colonoscopy have been reviewed with the patient. Questions have been answered. All parties agreeable.

## 2017-05-25 NOTE — Anesthesia Procedure Notes (Signed)
Procedure Name: MAC Date/Time: 05/25/2017 8:30 AM Performed by: Vista Deck Pre-anesthesia Checklist: Patient identified, Emergency Drugs available, Suction available, Timeout performed and Patient being monitored Patient Re-evaluated:Patient Re-evaluated prior to induction Oxygen Delivery Method: Non-rebreather mask

## 2017-05-25 NOTE — Discharge Instructions (Addendum)
Colonoscopy Discharge Instructions  Read the instructions outlined below and refer to this sheet in the next few weeks. These discharge instructions provide you with general information on caring for yourself after you leave the hospital. Your doctor may also give you specific instructions. While your treatment has been planned according to the most current medical practices available, unavoidable complications occasionally occur. If you have any problems or questions after discharge, call Dr. Gala Romney at [phone]. ACTIVITY  You may resume your regular activity, but move at a slower pace for the next 24 hours.   Take frequent rest periods for the next 24 hours.   Walking will help get rid of the air and reduce the bloated feeling in your belly (abdomen).   No driving for 24 hours (because of the medicine (anesthesia) used during the test).    Do not sign any important legal documents or operate any machinery for 24 hours (because of the anesthesia used during the test).  NUTRITION  Drink plenty of fluids.   You may resume your normal diet as instructed by your doctor.   Begin with a light meal and progress to your normal diet. Heavy or fried foods are harder to digest and may make you feel sick to your stomach (nauseated).   Avoid alcoholic beverages for 24 hours or as instructed.  MEDICATIONS  You may resume your normal medications unless your doctor tells you otherwise.  WHAT YOU CAN EXPECT TODAY  Some feelings of bloating in the abdomen.   Passage of more gas than usual.   Spotting of blood in your stool or on the toilet paper.  IF YOU HAD POLYPS REMOVED DURING THE COLONOSCOPY:  No aspirin products for 7 days or as instructed.   No alcohol for 7 days or as instructed.   Eat a soft diet for the next 24 hours.  FINDING OUT THE RESULTS OF YOUR TEST Not all test results are available during your visit. If your test results are not back during the visit, make an appointment  with your caregiver to find out the results. Do not assume everything is normal if you have not heard from your caregiver or the medical facility. It is important for you to follow up on all of your test results.  SEEK IMMEDIATE MEDICAL ATTENTION IF:  You have more than a spotting of blood in your stool.   Your belly is swollen (abdominal distention).   You are nauseated or vomiting.   You have a temperature over 101.   You have abdominal pain or discomfort that is severe or gets worse throughout the day.  EGD Discharge instructions Please read the instructions outlined below and refer to this sheet in the next few weeks. These discharge instructions provide you with general information on caring for yourself after you leave the hospital. Your doctor may also give you specific instructions. While your treatment has been planned according to the most current medical practices available, unavoidable complications occasionally occur. If you have any problems or questions after discharge, please call your doctor. ACTIVITY  You may resume your regular activity but move at a slower pace for the next 24 hours.   Take frequent rest periods for the next 24 hours.   Walking will help expel (get rid of) the air and reduce the bloated feeling in your abdomen.   No driving for 24 hours (because of the anesthesia (medicine) used during the test).   You may shower.   Do not sign any important  legal documents or operate any machinery for 24 hours (because of the anesthesia used during the test).  NUTRITION  Drink plenty of fluids.   You may resume your normal diet.   Begin with a light meal and progress to your normal diet.   Avoid alcoholic beverages for 24 hours or as instructed by your caregiver.  MEDICATIONS  You may resume your normal medications unless your caregiver tells you otherwise.  WHAT YOU CAN EXPECT TODAY  You may experience abdominal discomfort such as a feeling of fullness  or gas pains.  FOLLOW-UP  Your doctor will discuss the results of your test with you.  SEEK IMMEDIATE MEDICAL ATTENTION IF ANY OF THE FOLLOWING OCCUR:  Excessive nausea (feeling sick to your stomach) and/or vomiting.   Severe abdominal pain and distention (swelling).   Trouble swallowing.   Temperature over 101 F (37.8 C).   Rectal bleeding or vomiting of blood.    Continue Protonix 40 mg daily  Colon polyp and diverticulosis information provided  Further recommendations to follow pending review of pathology report  Office visit with [place] in one month  Oct 29th @ 8:30 am  Diverticulosis Diverticulosis is a condition that develops when small pouches (diverticula) form in the wall of the large intestine (colon). The colon is where water is absorbed and stool is formed. The pouches form when the inside layer of the colon pushes through weak spots in the outer layers of the colon. You may have a few pouches or many of them. What are the causes? The cause of this condition is not known. What increases the risk? The following factors may make you more likely to develop this condition:  Being older than age 50. Your risk for this condition increases with age. Diverticulosis is rare among people younger than age 55. By age 70, many people have it.  Eating a low-fiber diet.  Having frequent constipation.  Being overweight.  Not getting enough exercise.  Smoking.  Taking over-the-counter pain medicines, like aspirin and ibuprofen.  Having a family history of diverticulosis.  What are the signs or symptoms? In most people, there are no symptoms of this condition. If you do have symptoms, they may include:  Bloating.  Cramps in the abdomen.  Constipation or diarrhea.  Pain in the lower left side of the abdomen.  How is this diagnosed? This condition is most often diagnosed during an exam for other colon problems. Because diverticulosis usually has no symptoms, it  often cannot be diagnosed independently. This condition may be diagnosed by:  Using a flexible scope to examine the colon (colonoscopy).  Taking an X-ray of the colon after dye has been put into the colon (barium enema).  Doing a CT scan.  How is this treated? You may not need treatment for this condition if you have never developed an infection related to diverticulosis. If you have had an infection before, treatment may include:  Eating a high-fiber diet. This may include eating more fruits, vegetables, and grains.  Taking a fiber supplement.  Taking a live bacteria supplement (probiotic).  Taking medicine to relax your colon.  Taking antibiotic medicines.  Follow these instructions at home:  Drink 6-8 glasses of water or more each day to prevent constipation.  Try not to strain when you have a bowel movement.  If you have had an infection before: ? Eat more fiber as directed by your health care provider or your diet and nutrition specialist (dietitian). ? Take a fiber supplement  or probiotic, if your health care provider approves.  Take over-the-counter and prescription medicines only as told by your health care provider.  If you were prescribed an antibiotic, take it as told by your health care provider. Do not stop taking the antibiotic even if you start to feel better.  Keep all follow-up visits as told by your health care provider. This is important. Contact a health care provider if:  You have pain in your abdomen.  You have bloating.  You have cramps.  You have not had a bowel movement in 3 days. Get help right away if:  Your pain gets worse.  Your bloating becomes very bad.  You have a fever or chills, and your symptoms suddenly get worse.  You vomit.  You have bowel movements that are bloody or black.  You have bleeding from your rectum. Summary  Diverticulosis is a condition that develops when small pouches (diverticula) form in the wall of the  large intestine (colon).  You may have a few pouches or many of them.  This condition is most often diagnosed during an exam for other colon problems.  If you have had an infection related to diverticulosis, treatment may include increasing the fiber in your diet, taking supplements, or taking medicines. This information is not intended to replace advice given to you by your health care provider. Make sure you discuss any questions you have with your health care provider. Document Released: 05/26/2004 Document Revised: 07/18/2016 Document Reviewed: 07/18/2016 Elsevier Interactive Patient Education  2017 [place].  Colon Polyps Polyps are tissue growths inside the body. Polyps can grow in many places, including the large intestine (colon). A polyp may be a round bump or a mushroom-shaped growth. You could have one polyp or several. Most colon polyps are noncancerous (benign). However, some colon polyps can become cancerous over time. What are the causes? The exact cause of colon polyps is not known. What increases the risk? This condition is more likely to develop in people who:  Have a family history of colon cancer or colon polyps.  Are older than 56 or older than 45 if they are African American.  Have inflammatory bowel disease, such as ulcerative colitis or Crohn disease.  Are overweight.  Smoke cigarettes.  Do not get enough exercise.  Drink too much alcohol.  Eat a diet that is: ? High in fat and red meat. ? Low in fiber.  Had childhood cancer that was treated with abdominal radiation.  What are the signs or symptoms? Most polyps do not cause symptoms. If you have symptoms, they may include:  Blood coming from your rectum when having a bowel movement.  Blood in your stool.The stool may look dark red or black.  A change in bowel habits, such as constipation or diarrhea.  How is this diagnosed? This condition is diagnosed with a colonoscopy. This is a  procedure that uses a lighted, flexible scope to look at the inside of your colon. How is this treated? Treatment for this condition involves removing any polyps that are found. Those polyps will then be tested for cancer. If cancer is found, your health care provider will talk to you about options for colon cancer treatment. Follow these instructions at home: Diet  Eat plenty of fiber, such as fruits, vegetables, and whole grains.  Eat foods that are high in calcium and vitamin D, such as milk, cheese, yogurt, eggs, liver, fish, and broccoli.  Limit foods high in fat, red meats, and  processed meats, such as hot dogs, sausage, bacon, and lunch meats.  Maintain a healthy weight, or lose weight if recommended by your health care provider. General instructions  Do not smoke cigarettes.  Do not drink alcohol excessively.  Keep all follow-up visits as told by your health care provider. This is important. This includes keeping regularly scheduled colonoscopies. Talk to your health care provider about when you need a colonoscopy.  Exercise every day or as told by your health care provider. Contact a health care provider if:  You have new or worsening bleeding during a bowel movement.  You have new or increased blood in your stool.  You have a change in bowel habits.  You unexpectedly lose weight. This information is not intended to replace advice given to you by your health care provider. Make sure you discuss any questions you have with your health care provider. Document Released: 05/25/2004 Document Revised: 02/04/2016 Document Reviewed: 07/20/2015 Elsevier Interactive Patient Education  [employer].

## 2017-05-27 ENCOUNTER — Encounter: Payer: Self-pay | Admitting: Internal Medicine

## 2017-05-29 ENCOUNTER — Encounter (HOSPITAL_COMMUNITY): Payer: Self-pay | Admitting: Internal Medicine

## 2017-06-08 DIAGNOSIS — M545 Low back pain: Secondary | ICD-10-CM | POA: Diagnosis not present

## 2017-06-08 DIAGNOSIS — G894 Chronic pain syndrome: Secondary | ICD-10-CM | POA: Diagnosis not present

## 2017-06-08 DIAGNOSIS — M5416 Radiculopathy, lumbar region: Secondary | ICD-10-CM | POA: Diagnosis not present

## 2017-06-08 DIAGNOSIS — M961 Postlaminectomy syndrome, not elsewhere classified: Secondary | ICD-10-CM | POA: Diagnosis not present

## 2017-06-09 DIAGNOSIS — R945 Abnormal results of liver function studies: Secondary | ICD-10-CM | POA: Diagnosis not present

## 2017-06-09 DIAGNOSIS — K219 Gastro-esophageal reflux disease without esophagitis: Secondary | ICD-10-CM | POA: Diagnosis not present

## 2017-06-09 DIAGNOSIS — N401 Enlarged prostate with lower urinary tract symptoms: Secondary | ICD-10-CM | POA: Diagnosis not present

## 2017-06-09 DIAGNOSIS — Z0000 Encounter for general adult medical examination without abnormal findings: Secondary | ICD-10-CM | POA: Diagnosis not present

## 2017-06-09 DIAGNOSIS — I10 Essential (primary) hypertension: Secondary | ICD-10-CM | POA: Diagnosis not present

## 2017-06-09 DIAGNOSIS — J449 Chronic obstructive pulmonary disease, unspecified: Secondary | ICD-10-CM | POA: Diagnosis not present

## 2017-06-09 DIAGNOSIS — C61 Malignant neoplasm of prostate: Secondary | ICD-10-CM | POA: Diagnosis not present

## 2017-06-16 ENCOUNTER — Ambulatory Visit (INDEPENDENT_AMBULATORY_CARE_PROVIDER_SITE_OTHER): Payer: Medicare Other | Admitting: Urology

## 2017-06-16 DIAGNOSIS — R3912 Poor urinary stream: Secondary | ICD-10-CM

## 2017-06-16 DIAGNOSIS — R350 Frequency of micturition: Secondary | ICD-10-CM | POA: Diagnosis not present

## 2017-06-16 DIAGNOSIS — N403 Nodular prostate with lower urinary tract symptoms: Secondary | ICD-10-CM | POA: Diagnosis not present

## 2017-06-16 DIAGNOSIS — N3941 Urge incontinence: Secondary | ICD-10-CM

## 2017-06-29 DIAGNOSIS — E46 Unspecified protein-calorie malnutrition: Secondary | ICD-10-CM | POA: Diagnosis not present

## 2017-06-29 DIAGNOSIS — J449 Chronic obstructive pulmonary disease, unspecified: Secondary | ICD-10-CM | POA: Diagnosis not present

## 2017-06-29 DIAGNOSIS — J9611 Chronic respiratory failure with hypoxia: Secondary | ICD-10-CM | POA: Diagnosis not present

## 2017-07-10 ENCOUNTER — Encounter: Payer: Self-pay | Admitting: Gastroenterology

## 2017-07-10 ENCOUNTER — Ambulatory Visit (INDEPENDENT_AMBULATORY_CARE_PROVIDER_SITE_OTHER): Payer: Medicare Other | Admitting: Gastroenterology

## 2017-07-10 VITALS — BP 126/84 | HR 85 | Temp 98.3°F | Ht 63.0 in | Wt 116.4 lb

## 2017-07-10 DIAGNOSIS — K5900 Constipation, unspecified: Secondary | ICD-10-CM

## 2017-07-10 DIAGNOSIS — K2100 Gastro-esophageal reflux disease with esophagitis, without bleeding: Secondary | ICD-10-CM

## 2017-07-10 DIAGNOSIS — R6881 Early satiety: Secondary | ICD-10-CM | POA: Diagnosis not present

## 2017-07-10 DIAGNOSIS — R1013 Epigastric pain: Secondary | ICD-10-CM

## 2017-07-10 MED ORDER — LUBIPROSTONE 24 MCG PO CAPS: 24.0000 ug | ORAL_CAPSULE | Freq: Two times a day (BID) | ORAL | 3 refills | Status: DC

## 2017-07-10 NOTE — Progress Notes (Signed)
CC'ED TO PCP

## 2017-07-10 NOTE — Patient Instructions (Signed)
1. Please stop Mobic.  This may be contributing to your abdominal pain and may be affecting your kidneys. 2. Continue pantoprazole 40 mg daily for your heartburn and stomach. 3. Start Amitiza 24 mcg twice daily with food for constipation.  If you find that you go to the bathroom too much your stools become too frequent or too loose, you can back down to once daily.  Samples provided.  Prescription sent to [facility]. 4. Have your blood work done today. 5. Call first of next week and let me know if your abdominal pain is any better.  If you continue to have abdominal pain, we would consider CT scan of your stomach as the next step.

## 2017-07-10 NOTE — Progress Notes (Signed)
Primary Care Physician: Rosita Fire, MD  Primary Gastroenterologist:  Garfield Cornea, MD   Chief Complaint  Patient presents with  . Follow-up    pp   . Abdominal Pain    x 1 month    HPI: Bryan [name] is a 61 y.o. male here for follow-up.  He was seen back in August with several month history of epigastric pain.  Also with dysphagia.  Omeprazole was not helping.  Had 10 pound weight loss with decreased oral intake and early satiety.  EGD last month showed reflux esophagitis, normal stomach.  Esophagus was dilated for history of dysphagia.  Colonoscopy showed 1 polyp, diverticulosis, single AVM.  Anoscopy due in 5 years.  Still complaining of epigastric pain, early satiety, epigastric swelling. No heartburn. No vomiting. Few weeks of constipation. BM every few days. Takes hydrocodone three times per day. Takes mobic every day. No melena, brbpr.  Also has had problems emptying his bladder, seen a urologist.  States he has appointment to see the nephrologist next month.  Preop labs indicated slight increase in his creatinine.    Current Outpatient Prescriptions  Medication Sig Dispense Refill  . albuterol (PROVENTIL HFA;VENTOLIN HFA) 108 (90 BASE) MCG/ACT inhaler Inhale 1-2 puffs into the lungs every 4 (four) hours as needed for wheezing or shortness of breath. 1 Inhaler 0  . gabapentin (NEURONTIN) 300 MG capsule Take 300 mg by mouth 2 (two) times daily.    Marland Kitchen HYDROcodone-acetaminophen (NORCO/VICODIN) 5-325 MG tablet Take 1 tablet by mouth 3 (three) times daily as needed for moderate pain.    Marland Kitchen lisinopril-hydrochlorothiazide (PRINZIDE,ZESTORETIC) 10-12.5 MG tablet Take 1 tablet by mouth daily.    . meloxicam (MOBIC) 7.5 MG tablet Take 7.5 mg by mouth daily as needed for pain.     . pantoprazole (PROTONIX) 40 MG tablet Take 1 tablet (40 mg total) by mouth daily before breakfast. 30 tablet 11  . TRELEGY ELLIPTA 100-62.5-25 MCG/INH AEPB Inhale 1 puff into the lungs daily.      No  current facility-administered medications for this visit.     Allergies as of 07/10/2017  . (No Known Allergies)    ROS:  General: Negative for anorexia, weight loss, fever, chills, fatigue, weakness.  Weight continues to fluctuate up or down a few pounds. ENT: Negative for hoarseness, difficulty swallowing , nasal congestion. CV: Negative for chest pain, angina, palpitations, dyspnea on exertion, peripheral edema.  Respiratory: Negative for dyspnea at rest, dyspnea on exertion, cough, sputum, wheezing.  GI: See history of present illness. GU:  Negative for dysuria, hematuria, urinary incontinence, urinary frequency, nocturnal urination.  See HPI Endo: Negative for unusual weight change.    Physical Examination:   BP 126/84   Pulse 85   Temp 98.3 F (36.8 C) (Oral)   Ht _0  (1.6 m)   Wt 116 lb 6.4 oz (52.8 kg)   BMI 20.62 kg/m   General: Well-nourished, well-developed in no acute distress.  Eyes: No icterus. Mouth: Oropharyngeal mucosa moist and pink , no lesions erythema or exudate. Lungs: Clear to auscultation bilaterally.  Heart: Regular rate and rhythm, no murmurs rubs or gallops.  Abdomen: Bowel sounds are normal, mild to moderate epigastric tenderness, nondistended, no hepatosplenomegaly or masses, no abdominal bruits or hernia , no rebound or guarding.   Extremities: No lower extremity edema. No clubbing or deformities. Neuro: Alert and oriented x 4   Skin: Warm and dry, no jaundice.   Psych: Alert and cooperative, normal  mood and affect.  Labs:  Lab Results  Component Value Date   CREATININE 1.37 (H) 05/19/2017   BUN 13 05/19/2017   NA 136 05/25/2017   K 5.0 05/25/2017   CL 99 (L) 05/19/2017   CO2 28 05/19/2017   Lab Results  Component Value Date   WBC 7.6 05/19/2017   HGB 16.3 05/25/2017   HCT 48.0 05/25/2017   MCV 88.6 05/19/2017   PLT 283 05/19/2017    Imaging Studies: No results found.

## 2017-07-10 NOTE — Assessment & Plan Note (Addendum)
Reflux esophagitis on recent EGD.  Typical heartburn well controlled on pantoprazole 40 mg daily.  Continues to have epigastric pain and early satiety without explanation necessarily on EGD.  No improvement with switch from omeprazole to pantoprazole.  He is on Mobic but no evidence of gastritis or peptic ulcer disease on EGD.  We will check labs including LFTs, lipase and repeat creatinine.  He may ultimately need a CT scan with contrast if his creatinine allows.  Patient complains of several week history of constipation in the setting of frequent hydrocodone use.  Unclear if constipation contributing to his abdominal pain.  Plan to stop Mobic.  Continue pantoprazole.  Treat constipation with Amitiza 24 mcg twice daily with food.  Check labs.  He will call in 1 week with a progress report, if ongoing abdominal pain consider CT scan abdomen and pelvis with IV contrast if appropriate.

## 2017-07-11 DIAGNOSIS — R6881 Early satiety: Secondary | ICD-10-CM | POA: Diagnosis not present

## 2017-07-11 DIAGNOSIS — K210 Gastro-esophageal reflux disease with esophagitis: Secondary | ICD-10-CM | POA: Diagnosis not present

## 2017-07-11 DIAGNOSIS — K5900 Constipation, unspecified: Secondary | ICD-10-CM | POA: Diagnosis not present

## 2017-07-11 DIAGNOSIS — R1013 Epigastric pain: Secondary | ICD-10-CM | POA: Diagnosis not present

## 2017-07-11 LAB — COMPREHENSIVE METABOLIC PANEL
AG Ratio: 1.5 (calc) (ref 1.0–2.5)
ALBUMIN MSPROF: 4.7 g/dL (ref 3.6–5.1)
ALKALINE PHOSPHATASE (APISO): 117 U/L — AB (ref 40–115)
ALT: 12 U/L (ref 9–46)
AST: 16 U/L (ref 10–35)
BUN: 13 mg/dL (ref 7–25)
CO2: 22 mmol/L (ref 20–32)
CREATININE: 1.22 mg/dL (ref 0.70–1.25)
Calcium: 10.1 mg/dL (ref 8.6–10.3)
Chloride: 103 mmol/L (ref 98–110)
Globulin: 3.2 g/dL (calc) (ref 1.9–3.7)
Glucose, Bld: 82 mg/dL (ref 65–139)
POTASSIUM: 4.3 mmol/L (ref 3.5–5.3)
Sodium: 139 mmol/L (ref 135–146)
TOTAL PROTEIN: 7.9 g/dL (ref 6.1–8.1)
Total Bilirubin: 0.4 mg/dL (ref 0.2–1.2)

## 2017-07-11 LAB — LIPASE: LIPASE: 54 U/L (ref 7–60)

## 2017-07-14 ENCOUNTER — Emergency Department (HOSPITAL_COMMUNITY): Payer: Medicare Other

## 2017-07-14 ENCOUNTER — Inpatient Hospital Stay (HOSPITAL_COMMUNITY)
Admission: EM | Admit: 2017-07-14 | Discharge: 2017-07-17 | DRG: 194 | Disposition: A | Discharge status: Home / Self Care | Payer: Medicare Other | Attending: Family Medicine | Admitting: Family Medicine

## 2017-07-14 ENCOUNTER — Encounter (HOSPITAL_COMMUNITY): Payer: Self-pay | Admitting: Emergency Medicine

## 2017-07-14 DIAGNOSIS — J440 Chronic obstructive pulmonary disease with acute lower respiratory infection: Secondary | ICD-10-CM | POA: Diagnosis present

## 2017-07-14 DIAGNOSIS — R069 Unspecified abnormalities of breathing: Secondary | ICD-10-CM | POA: Diagnosis not present

## 2017-07-14 DIAGNOSIS — I10 Essential (primary) hypertension: Secondary | ICD-10-CM | POA: Diagnosis not present

## 2017-07-14 DIAGNOSIS — I482 Chronic atrial fibrillation: Secondary | ICD-10-CM | POA: Diagnosis not present

## 2017-07-14 DIAGNOSIS — Z86010 Personal history of colonic polyps: Secondary | ICD-10-CM

## 2017-07-14 DIAGNOSIS — Z809 Family history of malignant neoplasm, unspecified: Secondary | ICD-10-CM

## 2017-07-14 DIAGNOSIS — M1990 Unspecified osteoarthritis, unspecified site: Secondary | ICD-10-CM | POA: Diagnosis present

## 2017-07-14 DIAGNOSIS — J181 Lobar pneumonia, unspecified organism: Secondary | ICD-10-CM | POA: Diagnosis not present

## 2017-07-14 DIAGNOSIS — Z825 Family history of asthma and other chronic lower respiratory diseases: Secondary | ICD-10-CM | POA: Diagnosis not present

## 2017-07-14 DIAGNOSIS — J9611 Chronic respiratory failure with hypoxia: Secondary | ICD-10-CM | POA: Diagnosis present

## 2017-07-14 DIAGNOSIS — J441 Chronic obstructive pulmonary disease with (acute) exacerbation: Secondary | ICD-10-CM | POA: Diagnosis not present

## 2017-07-14 DIAGNOSIS — J189 Pneumonia, unspecified organism: Principal | ICD-10-CM | POA: Diagnosis present

## 2017-07-14 DIAGNOSIS — G8929 Other chronic pain: Secondary | ICD-10-CM | POA: Diagnosis not present

## 2017-07-14 DIAGNOSIS — K210 Gastro-esophageal reflux disease with esophagitis: Secondary | ICD-10-CM | POA: Diagnosis not present

## 2017-07-14 DIAGNOSIS — R0602 Shortness of breath: Secondary | ICD-10-CM | POA: Diagnosis not present

## 2017-07-14 DIAGNOSIS — Z87891 Personal history of nicotine dependence: Secondary | ICD-10-CM

## 2017-07-14 DIAGNOSIS — K219 Gastro-esophageal reflux disease without esophagitis: Secondary | ICD-10-CM | POA: Diagnosis present

## 2017-07-14 DIAGNOSIS — N183 Chronic kidney disease, stage 3 unspecified: Secondary | ICD-10-CM | POA: Diagnosis present

## 2017-07-14 DIAGNOSIS — I129 Hypertensive chronic kidney disease with stage 1 through stage 4 chronic kidney disease, or unspecified chronic kidney disease: Secondary | ICD-10-CM | POA: Diagnosis present

## 2017-07-14 LAB — CBC WITH DIFFERENTIAL/PLATELET
BASOS ABS: 0 10*3/uL (ref 0.0–0.1)
Basophils Relative: 0 %
Eosinophils Absolute: 0 10*3/uL (ref 0.0–0.7)
Eosinophils Relative: 0 %
HEMATOCRIT: 43.9 % (ref 39.0–52.0)
Hemoglobin: 15 g/dL (ref 13.0–17.0)
LYMPHS PCT: 12 %
Lymphs Abs: 1.3 10*3/uL (ref 0.7–4.0)
MCH: 30.6 pg (ref 26.0–34.0)
MCHC: 34.2 g/dL (ref 30.0–36.0)
MCV: 89.6 fL (ref 78.0–100.0)
MONO ABS: 0.7 10*3/uL (ref 0.1–1.0)
Monocytes Relative: 6 %
NEUTROS ABS: 9.1 10*3/uL — AB (ref 1.7–7.7)
Neutrophils Relative %: 82 %
Platelets: 284 10*3/uL (ref 150–400)
RBC: 4.9 MIL/uL (ref 4.22–5.81)
RDW: 15.2 % (ref 11.5–15.5)
WBC: 11.2 10*3/uL — ABNORMAL HIGH (ref 4.0–10.5)

## 2017-07-14 LAB — BLOOD GAS, ARTERIAL
Acid-Base Excess: 1.9 mmol/L (ref 0.0–2.0)
BICARBONATE: 25.6 mmol/L (ref 20.0–28.0)
Drawn by: 221791
O2 Content: 4 L/min
O2 Saturation: 89.3 %
PH ART: 7.39 (ref 7.350–7.450)
PO2 ART: 59.2 mmHg — AB (ref 83.0–108.0)
Patient temperature: 37
pCO2 arterial: 44.5 mmHg (ref 32.0–48.0)

## 2017-07-14 LAB — COMPREHENSIVE METABOLIC PANEL
ALK PHOS: 128 U/L — AB (ref 38–126)
ALT: 14 U/L — AB (ref 17–63)
AST: 23 U/L (ref 15–41)
Albumin: 4.8 g/dL (ref 3.5–5.0)
Anion gap: 13 (ref 5–15)
BILIRUBIN TOTAL: 0.6 mg/dL (ref 0.3–1.2)
BUN: 8 mg/dL (ref 6–20)
CALCIUM: 9.6 mg/dL (ref 8.9–10.3)
CO2: 28 mmol/L (ref 22–32)
CREATININE: 1.15 mg/dL (ref 0.61–1.24)
Chloride: 100 mmol/L — ABNORMAL LOW (ref 101–111)
GFR calc Af Amer: >60 mL/min (ref >60)
Glucose, Bld: 122 mg/dL — ABNORMAL HIGH (ref 65–99)
Potassium: 3.6 mmol/L (ref 3.5–5.1)
Sodium: 141 mmol/L (ref 135–145)
TOTAL PROTEIN: 9.2 g/dL — AB (ref 6.5–8.1)

## 2017-07-14 LAB — INFLUENZA PANEL BY PCR (TYPE A & B)
Influenza A By PCR: NEGATIVE
Influenza B By PCR: NEGATIVE

## 2017-07-14 LAB — LACTIC ACID, PLASMA
LACTIC ACID, VENOUS: 1.3 mmol/L (ref 0.5–1.9)
Lactic Acid, Venous: 1.7 mmol/L (ref 0.5–1.9)

## 2017-07-14 LAB — TROPONIN I: Troponin I: 0.03 ng/mL (ref <0.03)

## 2017-07-14 LAB — BRAIN NATRIURETIC PEPTIDE: B NATRIURETIC PEPTIDE 5: 101 pg/mL — AB (ref 0.0–100.0)

## 2017-07-14 LAB — PROCALCITONIN

## 2017-07-14 MED ORDER — HYDRALAZINE HCL 20 MG/ML IJ SOLN: 10.0000 mg | INTRAMUSCULAR | Status: DC | PRN

## 2017-07-14 MED ORDER — LEVOFLOXACIN IN D5W 750 MG/150ML IV SOLN: 750.0000 mg | INTRAVENOUS | Status: DC

## 2017-07-14 MED ORDER — FLUTICASONE-UMECLIDIN-VILANT 100-62.5-25 MCG/INH IN AEPB
1.0000 | INHALATION_SPRAY | Freq: Every day | RESPIRATORY_TRACT | Status: DC
  Filled 2017-07-14 (×3): qty 60

## 2017-07-14 MED ORDER — LEVALBUTEROL HCL 1.25 MG/0.5ML IN NEBU
1.2500 mg | INHALATION_SOLUTION | Freq: Once | RESPIRATORY_TRACT | Status: AC
  Administered 2017-07-14: 1.25 mg via RESPIRATORY_TRACT
  Filled 2017-07-14: qty 0.5

## 2017-07-14 MED ORDER — LUBIPROSTONE 24 MCG PO CAPS
24.0000 ug | ORAL_CAPSULE | Freq: Two times a day (BID) | ORAL | Status: DC
  Administered 2017-07-16 – 2017-07-17 (×3): 24 ug via ORAL
  Filled 2017-07-14 (×4): qty 1

## 2017-07-14 MED ORDER — PANTOPRAZOLE SODIUM 40 MG PO TBEC
40.0000 mg | DELAYED_RELEASE_TABLET | Freq: Every day | ORAL | Status: DC
  Administered 2017-07-15 – 2017-07-17 (×3): 40 mg via ORAL
  Filled 2017-07-14 (×3): qty 1

## 2017-07-14 MED ORDER — ENOXAPARIN SODIUM 40 MG/0.4ML ~~LOC~~ SOLN
40.0000 mg | SUBCUTANEOUS | Status: DC
  Administered 2017-07-14 – 2017-07-16 (×3): 40 mg via SUBCUTANEOUS
  Filled 2017-07-14 (×3): qty 0.4

## 2017-07-14 MED ORDER — GABAPENTIN 300 MG PO CAPS
300.0000 mg | ORAL_CAPSULE | Freq: Two times a day (BID) | ORAL | Status: DC
  Administered 2017-07-14 – 2017-07-17 (×6): 300 mg via ORAL
  Filled 2017-07-14 (×6): qty 1

## 2017-07-14 MED ORDER — HYDROCODONE-ACETAMINOPHEN 5-325 MG PO TABS
1.0000 | ORAL_TABLET | Freq: Four times a day (QID) | ORAL | Status: DC | PRN
  Administered 2017-07-14 – 2017-07-15 (×2): 1 via ORAL
  Filled 2017-07-14 (×2): qty 1

## 2017-07-14 MED ORDER — MORPHINE SULFATE (PF) 2 MG/ML IV SOLN
2.0000 mg | Freq: Once | INTRAVENOUS | Status: AC
  Administered 2017-07-14: 2 mg via INTRAVENOUS
  Filled 2017-07-14: qty 1

## 2017-07-14 MED ORDER — HYDROCHLOROTHIAZIDE 12.5 MG PO CAPS
12.5000 mg | ORAL_CAPSULE | Freq: Every day | ORAL | Status: DC
  Administered 2017-07-15 – 2017-07-17 (×3): 12.5 mg via ORAL
  Filled 2017-07-14 (×3): qty 1

## 2017-07-14 MED ORDER — METHYLPREDNISOLONE SODIUM SUCC 40 MG IJ SOLR
40.0000 mg | Freq: Three times a day (TID) | INTRAMUSCULAR | Status: DC
  Administered 2017-07-14 – 2017-07-15 (×3): 40 mg via INTRAVENOUS
  Filled 2017-07-14 (×3): qty 1

## 2017-07-14 MED ORDER — LEVOFLOXACIN IN D5W 500 MG/100ML IV SOLN
500.0000 mg | Freq: Once | INTRAVENOUS | Status: AC
  Administered 2017-07-14: 500 mg via INTRAVENOUS
  Filled 2017-07-14: qty 100

## 2017-07-14 MED ORDER — MELOXICAM 7.5 MG PO TABS
7.5000 mg | ORAL_TABLET | Freq: Every day | ORAL | Status: DC
  Administered 2017-07-15 – 2017-07-17 (×3): 7.5 mg via ORAL
  Filled 2017-07-14 (×5): qty 1

## 2017-07-14 MED ORDER — ALBUTEROL SULFATE (2.5 MG/3ML) 0.083% IN NEBU: 2.5000 mg | INHALATION_SOLUTION | RESPIRATORY_TRACT | Status: DC | PRN

## 2017-07-14 MED ORDER — LISINOPRIL-HYDROCHLOROTHIAZIDE 10-12.5 MG PO TABS: 1.0000 | ORAL_TABLET | Freq: Every day | ORAL | Status: DC

## 2017-07-14 MED ORDER — LISINOPRIL 10 MG PO TABS
10.0000 mg | ORAL_TABLET | Freq: Every day | ORAL | Status: DC
  Administered 2017-07-15 – 2017-07-17 (×3): 10 mg via ORAL
  Filled 2017-07-14 (×3): qty 1

## 2017-07-14 NOTE — H&P (Addendum)
History and Physical    Bryan [name] YQM:[id number] DOB: [birth year] DOA: 07/14/2017  PCP: Rosita Fire, MD   Patient coming from: Home  Chief Complaint: SOB, cough, fever, chills   HPI: Bryan [name] is a 61 y.o. male with medical history significant for COPD with chronic hypoxic respiratory failure, chronic pain, hypertension, and GERD, now presenting to the emergency department with 2 days of dyspnea and cough, and 1 day of fevers and chills.  Patient reports that he been in his usual state of health until approximately 2 days ago when he noted the insidious development of a nonproductive cough and dyspnea.  This has progressively worsened the ensuing days and he now reports intermittent fever and chills as well.  Called EMS for transport to the hospital and was treated with 125 mg IV Solu-Medrol and prior to arrival.  ED Course: Upon arrival to the ED, patient is found to be afebrile, saturating low 90s on 4 L/min of supplemental oxygen, tachypneic in the 30s, tachycardic in the 110s, and slightly hypertensive.  EKG features a sinus tachycardia with 115 distribution and mild interstitial opacities suggestive of edema or infection.  Chemistry panel and CBC are largely unremarkable, lactic acid is reassuringly normal, troponin is slightly elevated to 0.03, and BNP is mildly elevated to 101.  Patient was treated with Levaquin, morphine, and Xopenex in the ED.  He remains slightly tachycardic and mildly tachypneic, but in no acute distress.  He will be admitted to the telemetry unit for ongoing evaluation and management dyspnea, cough, fevers, and chills, suspected secondary to pneumonia.  Review of Systems:  All other systems reviewed and apart from HPI, are negative.  Past Medical History:  Diagnosis Date  . Acid reflux   . Arthritis   . Asthma   . Chronic pain    with leg and back pain (disc problem)  . COPD (chronic obstructive pulmonary disease) ([place])   . History of upper GI x-ray series     to follow showed large duodenal ulcer H pylori serologies were negative  . Hypertension   . Tubular adenoma     Past Surgical History:  Procedure Laterality Date  . BACK SURGERY  7/05; 5/09    Dr.Hirsch,3 lumbar  . BACK SURGERY    . COLONOSCOPY  Feb 2012   Dr. Gala Romney: normal rectum, pedunculate polyp removed but not recovered  . COLONOSCOPY WITH ESOPHAGOGASTRODUODENOSCOPY (EGD) N/A 11/18/2013   Dr.Rourk- tcs= normal rectum, multipe polyps about the ileocecal valve and distal transverse colon o/w the remainder of the colonic mucosa appeared normal bx= tubular adenoma. EGD= normal esophagus, stomach with scattered erosions mottling, friablility, no ulcer or infiltrating process patent pylorus bx= chronic inflammation. next TCS 11/2016  . COLONOSCOPY WITH PROPOFOL N/A 05/25/2017   Procedure: COLONOSCOPY WITH PROPOFOL;  Surgeon: Daneil Dolin, MD;  [facility];  Service: Endoscopy;  Laterality: N/A;  8:30am  . ESOPHAGOGASTRODUODENOSCOPY  1/06   Dr. Volney American esophageal erosions,U-shaped stomach,marked erosions and edema of the bulb without discrete ulcer disease.   . ESOPHAGOGASTRODUODENOSCOPY (EGD) WITH PROPOFOL N/A 05/25/2017   Procedure: ESOPHAGOGASTRODUODENOSCOPY (EGD) WITH PROPOFOL;  Surgeon: Daneil Dolin, MD;  [facility];  Service: Endoscopy;  Laterality: N/A;  . Venia Minks DILATION N/A 05/25/2017   Procedure: Venia Minks DILATION;  Surgeon: Daneil Dolin, MD;  [facility];  Service: Endoscopy;  Laterality: N/A;  . NECK SURGERY  2/09  . POLYPECTOMY  05/25/2017   Procedure: POLYPECTOMY;  Surgeon: Manus Rudd  M, MD;  [facility]: [facility];  Service: Endoscopy;;  rectal     reports that he quit smoking about 2 years ago. His smoking use included Cigarettes. He has a 20.00 pack-year smoking history. He has never used smokeless tobacco. He reports that he uses drugs, including Marijuana. He reports that he does not drink alcohol.  No Known  Allergies  Family History  Problem Relation Age of Onset  . Cancer Father   . Asthma Mother   . Colon cancer Neg Hx      Prior to Admission medications   Medication Sig Start Date End Date Taking? Authorizing Provider  albuterol (PROVENTIL HFA;VENTOLIN HFA) 108 (90 BASE) MCG/ACT inhaler Inhale 1-2 puffs into the lungs every 4 (four) hours as needed for wheezing or shortness of breath. 07/08/15   Fransico Meadow, PA-C  gabapentin (NEURONTIN) 300 MG capsule Take 300 mg by mouth 2 (two) times daily.    [provider]  HYDROcodone-acetaminophen (NORCO/VICODIN) 5-325 MG tablet Take 1 tablet by mouth 3 (three) times daily as needed for moderate pain.    [provider]  lisinopril-hydrochlorothiazide (PRINZIDE,ZESTORETIC) 10-12.5 MG tablet Take 1 tablet by mouth daily.    [provider]  lubiprostone (AMITIZA) 24 MCG capsule Take 1 capsule (24 mcg total) by mouth 2 (two) times daily with a meal. 07/10/17   Mahala Menghini, PA-C  meloxicam (MOBIC) 7.5 MG tablet Take 7.5 mg by mouth daily as needed for pain.     [provider]  pantoprazole (PROTONIX) 40 MG tablet Take 1 tablet (40 mg total) by mouth daily before breakfast. 05/01/17   Mahala Menghini, PA-C  TRELEGY ELLIPTA 100-62.5-25 MCG/INH AEPB Inhale 1 puff into the lungs daily.  05/04/17   [provider]    Physical Exam: Vitals:   07/14/17 1625 07/14/17 1630 07/14/17 1719 07/14/17 1855  BP: (!) 157/86 (!) 145/88  128/88  Pulse: (!) 119 (!) 122  (!) 110  Resp: 18 (!) 32  (!) 27  Temp: 99.3 F (37.4 C)     SpO2: 100% 98% 91% 93%  Weight: 52.6 kg (116 lb)     Height: _0  (1.6 m)         Constitutional: NAD, calm  Eyes: PERTLA, lids and conjunctivae normal ENMT: Mucous membranes are moist. Posterior pharynx clear of any exudate or lesions.   Neck: normal, supple, no masses, no thyromegaly Respiratory: Mild tachypnea and dyspnea with speech. Rhonchi at left base. No accessory muscle  use.  Cardiovascular: Rate ~110 and regular. No extremity edema. No significant JVD. Abdomen: No distension, no tenderness, no masses palpated. Bowel sounds normal.  Musculoskeletal: no clubbing / cyanosis. No joint deformity upper and lower extremities.    Skin: no significant rashes, lesions, ulcers. Warm, dry, well-perfused. Neurologic: CN 2-12 grossly intact. Sensation intact.  Strength 5/5 in all 4 limbs.  Psychiatric: Alert and oriented x 3. Calm, cooperative.     Labs on Admission: I have personally reviewed following labs and imaging studies  CBC:  Recent Labs Lab 07/14/17 1655  WBC 11.2*  NEUTROABS 9.1*  HGB 15.0  HCT 43.9  MCV 89.6  PLT 542   Basic Metabolic Panel:  Recent Labs Lab 07/11/17 1300 07/14/17 1655  NA 139 141  K 4.3 3.6  CL 103 100*  CO2 22 28  GLUCOSE 82 122*  BUN 13 8  CREATININE 1.22 1.15  CALCIUM 10.1 9.6   GFR: Estimated Creatinine Clearance: 50.2 mL/min (by C-G formula  based on SCr of 1.15 mg/dL). Liver Function Tests:  Recent Labs Lab 07/11/17 1300 07/14/17 1655  AST 16 23  ALT 12 14*  ALKPHOS  --  128*  BILITOT 0.4 0.6  PROT 7.9 9.2*  ALBUMIN  --  4.8    Recent Labs Lab 07/11/17 1300  LIPASE 54   No results for input(s): AMMONIA in the last 168 hours. Coagulation Profile: No results for input(s): INR, PROTIME in the last 168 hours. Cardiac Enzymes:  Recent Labs Lab 07/14/17 1655  TROPONINI 0.03*   BNP (last 3 results) No results for input(s): PROBNP in the last 8760 hours. HbA1C: No results for input(s): HGBA1C in the last 72 hours. CBG: No results for input(s): GLUCAP in the last 168 hours. Lipid Profile: No results for input(s): CHOL, HDL, LDLCALC, TRIG, CHOLHDL, LDLDIRECT in the last 72 hours. Thyroid Function Tests: No results for input(s): TSH, T4TOTAL, FREET4, T3FREE, THYROIDAB in the last 72 hours. Anemia Panel: No results for input(s): VITAMINB12, FOLATE, FERRITIN, TIBC, IRON, RETICCTPCT in the last  72 hours. Urine analysis:    Component Value Date/Time   COLORURINE STRAW (A) 12/01/2016 1935   APPEARANCEUR CLEAR 12/01/2016 1935   LABSPEC 1.009 12/01/2016 1935   PHURINE 5.0 12/01/2016 1935   GLUCOSEU NEGATIVE 12/01/2016 1935   HGBUR NEGATIVE 12/01/2016 1935   BILIRUBINUR NEGATIVE 12/01/2016 1935   KETONESUR NEGATIVE 12/01/2016 1935   PROTEINUR NEGATIVE 12/01/2016 1935   UROBILINOGEN 0.2 07/08/2015 1314   NITRITE NEGATIVE 12/01/2016 1935   LEUKOCYTESUR NEGATIVE 12/01/2016 1935   Sepsis Labs: _0 (procalcitonin:4,lacticidven:4) )No results found for this or any previous visit (from the past 240 hour(s)).   Radiological Exams on Admission: Dg Chest Port 1 View  Result Date: 07/14/2017 CLINICAL DATA:  Patient with shortness of breath for 2 days. EXAM: PORTABLE CHEST 1 VIEW COMPARISON:  Chest radiograph 05/16/2016. FINDINGS: Monitoring leads overlie the patient. Stable cardiac and mediastinal contours. Pulmonary vascular redistribution. Mild interstitial opacities bilaterally. No large area of pulmonary consolidation. No pleural effusion or pneumothorax. IMPRESSION: Pulmonary vascular redistribution with mild interstitial opacities which may represent mild edema. Infection not excluded. Electronically Signed   By: Lovey Newcomer M.D.   On: 07/14/2017 17:47    EKG: Independently reviewed. Sinus tachycardia (rate 115).   Assessment/Plan  1. CAP  - Pt presents with dyspnea, cough, and fever/chills at home - Found to have leukocytosis, tachycardia, and suspected PNA on CXR  - Treated in ED with Levaquin  - Plan to check sputum culture and strep pneumo urinary antigen, trend procalcitonin, continue Levaquin with adjunctive glucocorticoid   2. COPD exacerbation, chronic hypoxic respiratory failure  - Treated prior to arrival with 125 mg IV Solu-Medrol and albuterol neb  - Requiring 4 Lpm supplemental O2 to maintain sat in low 90's  - Check sputum culture, continue systemic  steroid, continue Trelegy Ellipta and prn nebs, abx as above   3. Hypertension  - BP at goal  - Continue lisinopril-HCTZ    4. Chronic pain  - Continue home regimen with gabapentin, meloxicam, and prn Norco   DVT prophylaxis: Lovenox Code Status: Full  Family Communication: Discussed with patient Disposition Plan: Admit to telemetry Consults called: None Admission status: Inpatient    Vianne Bulls, MD [facility] Pager [phone]  If 7PM-7AM, please contact night-coverage [email or web address] Password [facility]  07/14/2017, 7:43 PM

## 2017-07-14 NOTE — ED Triage Notes (Signed)
Pt c/o sob x 2 days. Pt given 133m solumedrol IV and 594malberterol neb in route. Hx asthma.

## 2017-07-14 NOTE — ED Provider Notes (Signed)
[facility] EMERGENCY DEPARTMENT Provider Note   CSN: [id number] Arrival date & time: 07/14/17  1623     History   Chief Complaint Chief Complaint  Patient presents with  . Shortness of Breath    HPI DOUG BUCKLIN is a 61 y.o. male.  HPI Patient presents with worsening shortness of breath, nonproductive cough, fever and chills that started overnight.  Received IV Solu-Medrol and albuterol neb in route.  States he had mild improvement of the shortness of breath.  Patient states he is intermittently been using oxygen at home but has not had any for the past few days.  He has chronic low back pain with bilateral sciatica.  This is unchanged.  Patient also complains of ongoing upper abdominal pain for which she has been seen by gastroenterology.  Had upper endoscopy performed several months ago revealing reflux gastritis. Past Medical History:  Diagnosis Date  . Acid reflux   . Arthritis   . Asthma   . Chronic pain    with leg and back pain (disc problem)  . COPD (chronic obstructive pulmonary disease) ([place])   . History of upper GI x-ray series    to follow showed large duodenal ulcer H pylori serologies were negative  . Hypertension   . Tubular adenoma     Patient Active Problem List   Diagnosis Date Noted  . Constipation 07/10/2017  . Hx of adenomatous colonic polyps 05/01/2017  . Acute renal failure (ARF) ([place]) 05/16/2016  . Nausea with vomiting 05/16/2016  . Dehydration 05/16/2016  . Hypertension   . Chronic pain   . Heme positive stool 10/20/2014  . Abdominal pain, epigastric 06/19/2014  . Esophageal dysphagia 06/19/2014  . AP (abdominal pain) 11/13/2013  . Early satiety 11/13/2013  . COPD, severe ([place]) 02/22/2013  . Low back pain 11/19/2012  . COLITIS 05/12/2009  . ABDOMINAL PAIN, LEFT LOWER QUADRANT 05/12/2009  . DUODENAL ULCER, HX OF 05/12/2009  . ALCOHOL USE 05/11/2009  . GERD 05/11/2009  . Diarrhea 05/11/2009  . SPONDYLOSIS, CERVICAL 07/03/2007  . NECK  PAIN 07/03/2007    Past Surgical History:  Procedure Laterality Date  . BACK SURGERY  7/05; 5/09    Dr.Hirsch,3 lumbar  . BACK SURGERY    . COLONOSCOPY  Feb 2012   Dr. Gala Romney: normal rectum, pedunculate polyp removed but not recovered  . COLONOSCOPY WITH ESOPHAGOGASTRODUODENOSCOPY (EGD) N/A 11/18/2013   Dr.Rourk- tcs= normal rectum, multipe polyps about the ileocecal valve and distal transverse colon o/w the remainder of the colonic mucosa appeared normal bx= tubular adenoma. EGD= normal esophagus, stomach with scattered erosions mottling, friablility, no ulcer or infiltrating process patent pylorus bx= chronic inflammation. next TCS 11/2016  . COLONOSCOPY WITH PROPOFOL N/A 05/25/2017   Procedure: COLONOSCOPY WITH PROPOFOL;  Surgeon: Daneil Dolin, MD;  [facility];  Service: Endoscopy;  Laterality: N/A;  8:30am  . ESOPHAGOGASTRODUODENOSCOPY  1/06   Dr. Volney American esophageal erosions,U-shaped stomach,marked erosions and edema of the bulb without discrete ulcer disease.   . ESOPHAGOGASTRODUODENOSCOPY (EGD) WITH PROPOFOL N/A 05/25/2017   Procedure: ESOPHAGOGASTRODUODENOSCOPY (EGD) WITH PROPOFOL;  Surgeon: Daneil Dolin, MD;  [facility];  Service: Endoscopy;  Laterality: N/A;  . Venia Minks DILATION N/A 05/25/2017   Procedure: Venia Minks DILATION;  Surgeon: Daneil Dolin, MD;  [facility];  Service: Endoscopy;  Laterality: N/A;  . NECK SURGERY  2/09  . POLYPECTOMY  05/25/2017   Procedure: POLYPECTOMY;  Surgeon: Daneil Dolin, MD;  [facility];  Service: Endoscopy;;  rectal       Home Medications    Prior to Admission medications   Medication Sig Start Date End Date Taking? Authorizing Provider  albuterol (PROVENTIL HFA;VENTOLIN HFA) 108 (90 BASE) MCG/ACT inhaler Inhale 1-2 puffs into the lungs every 4 (four) hours as needed for wheezing or shortness of breath. 07/08/15   Fransico Meadow, PA-C  gabapentin (NEURONTIN) 300 MG capsule Take 300 mg  by mouth 2 (two) times daily.    [provider]  HYDROcodone-acetaminophen (NORCO/VICODIN) 5-325 MG tablet Take 1 tablet by mouth 3 (three) times daily as needed for moderate pain.    [provider]  lisinopril-hydrochlorothiazide (PRINZIDE,ZESTORETIC) 10-12.5 MG tablet Take 1 tablet by mouth daily.    [provider]  lubiprostone (AMITIZA) 24 MCG capsule Take 1 capsule (24 mcg total) by mouth 2 (two) times daily with a meal. 07/10/17   Mahala Menghini, PA-C  meloxicam (MOBIC) 7.5 MG tablet Take 7.5 mg by mouth daily as needed for pain.     [provider]  pantoprazole (PROTONIX) 40 MG tablet Take 1 tablet (40 mg total) by mouth daily before breakfast. 05/01/17   Mahala Menghini, PA-C  TRELEGY ELLIPTA 100-62.5-25 MCG/INH AEPB Inhale 1 puff into the lungs daily.  05/04/17   [provider]    Family History Family History  Problem Relation Age of Onset  . Cancer Father   . Asthma Mother   . Colon cancer Neg Hx     Social History Social History  Substance Use Topics  . Smoking status: Former Smoker    Packs/day: 0.50    Years: 40.00    Types: Cigarettes    Quit date: 11/11/2014  . Smokeless tobacco: Never Used  . Alcohol use No     Allergies   Patient has no known allergies.   Review of Systems Review of Systems  Constitutional: Positive for chills, fatigue and fever.  HENT: Negative for congestion, sore throat and trouble swallowing.   Eyes: Negative for photophobia and visual disturbance.  Respiratory: Positive for cough, shortness of breath and wheezing.   Cardiovascular: Negative for chest pain, palpitations and leg swelling.  Gastrointestinal: Positive for abdominal pain. Negative for constipation, diarrhea, nausea and vomiting.  Genitourinary: Negative for dysuria, flank pain and frequency.  Musculoskeletal: Positive for back pain. Negative for myalgias, neck pain and neck stiffness.  Skin: Negative for rash and wound.    Neurological: Negative for dizziness, weakness, light-headedness, numbness and headaches.  All other systems reviewed and are negative.    Physical Exam Updated Vital Signs BP (!) 145/88   Pulse (!) 122   Temp 99.3 F (37.4 C)   Resp (!) 32   Ht _0  (1.6 m)   Wt 52.6 kg (116 lb)   SpO2 91%   BMI 20.55 kg/m   Physical Exam  Constitutional: He is oriented to person, place, and time. He appears well-developed and well-nourished. No distress.  HENT:  Head: Normocephalic and atraumatic.  Mouth/Throat: Oropharynx is clear and moist. No oropharyngeal exudate.  Eyes: Pupils are equal, round, and reactive to light. EOM are normal.  Neck: Normal range of motion. Neck supple. JVD present.  Cardiovascular: Regular rhythm.   Tachycardia  Pulmonary/Chest:  Increased work of breathing.  Diminished breath sounds throughout with rhonchi in the right base.  Patient does have some scattered end expiratory wheezes.  Abdominal: Soft. Bowel sounds are normal. There is tenderness. There is no rebound and no  guarding.  Mild epigastric tenderness to palpation.  Musculoskeletal: Normal range of motion. He exhibits no edema or tenderness.  No lower extremity swelling, asymmetry or tenderness.  Neurological: He is alert and oriented to person, place, and time.  Moving all extremities without focal deficit.  Sensation fully intact.  Skin: Skin is warm and dry. Capillary refill takes less than 2 seconds. No rash noted. No erythema.  Psychiatric: He has a normal mood and affect. His behavior is normal.  Nursing note and vitals reviewed.    ED Treatments / Results  Labs (all labs ordered are listed, but only abnormal results are displayed) Labs Reviewed  CBC WITH DIFFERENTIAL/PLATELET - Abnormal; Notable for the following:       Result Value   WBC 11.2 (*)    Neutro Abs 9.1 (*)    All other components within normal limits  COMPREHENSIVE METABOLIC PANEL - Abnormal; Notable for the following:     Chloride 100 (*)    Glucose, Bld 122 (*)    Total Protein 9.2 (*)    ALT 14 (*)    Alkaline Phosphatase 128 (*)    All other components within normal limits  BRAIN NATRIURETIC PEPTIDE - Abnormal; Notable for the following:    B Natriuretic Peptide 101.0 (*)    All other components within normal limits  TROPONIN I - Abnormal; Notable for the following:    Troponin I 0.03 (*)    All other components within normal limits  BLOOD GAS, ARTERIAL - Abnormal; Notable for the following:    pO2, Arterial 59.2 (*)    Allens test (pass/fail) NOT INDICATED (*)    All other components within normal limits  LACTIC ACID, PLASMA  INFLUENZA PANEL BY PCR (TYPE A & B)  LACTIC ACID, PLASMA    EKG  EKG Interpretation  Date/Time:  Friday July 14 2017 16:27:21 EDT Ventricular Rate:  115 PR Interval:    QRS Duration: 80 QT Interval:  308 QTC Calculation: 426 R Axis:   91 Text Interpretation:  Sinus tachycardia Right axis deviation Probable anteroseptal infarct, old Confirmed by Julianne Rice [phone]) on 07/14/2017 6:55:23 PM       Radiology Dg Chest Port 1 View  Result Date: 07/14/2017 CLINICAL DATA:  Patient with shortness of breath for 2 days. EXAM: PORTABLE CHEST 1 VIEW COMPARISON:  Chest radiograph 05/16/2016. FINDINGS: Monitoring leads overlie the patient. Stable cardiac and mediastinal contours. Pulmonary vascular redistribution. Mild interstitial opacities bilaterally. No large area of pulmonary consolidation. No pleural effusion or pneumothorax. IMPRESSION: Pulmonary vascular redistribution with mild interstitial opacities which may represent mild edema. Infection not excluded. Electronically Signed   By: Lovey Newcomer M.D.   On: 07/14/2017 17:47    Procedures Procedures (including critical care time)  Medications Ordered in ED Medications  levofloxacin (LEVAQUIN) IVPB 500 mg (500 mg Intravenous New Bag/Given 07/14/17 1854)  levalbuterol (XOPENEX) nebulizer solution 1.25 mg (1.25 mg  Nebulization Given 07/14/17 1719)     Initial Impression / Assessment and Plan / ED Course  I have reviewed the triage vital signs and the nursing notes.  Pertinent labs & imaging results that were available during my care of the patient were reviewed by me and considered in my medical decision making (see chart for details).    Given infective symptoms, elevated white blood cell count, believe infiltrates on x-ray likely represent pneumonia versus aspiration pneumonitis.  Will start antibiotics.  Discussed with hospitalist who will see patient in the emergency department and admit.  Final Clinical Impressions(s) / ED Diagnoses   Final diagnoses:  Community acquired pneumonia, unspecified laterality    New Prescriptions New Prescriptions   No medications on file     Julianne Rice, MD 07/14/17 [phone]

## 2017-07-14 NOTE — ED Notes (Signed)
Date and time results received: 07/14/17 6:12 PM  (use smartphrase ".now" to insert current time)  Test: Troponin Critical Value: 0.03  Name of Provider Notified: Lita Mains  Orders Received? Or Actions Taken?: Orders Received - See Orders for details

## 2017-07-14 NOTE — ED Notes (Signed)
Attempted to give pt his antibiotics and pt and family had multiple questions that this RN was unable to answer at this time. This RN will check with the pt's nurse and she will hang the pt's antibiotic.

## 2017-07-15 DIAGNOSIS — I10 Essential (primary) hypertension: Secondary | ICD-10-CM

## 2017-07-15 DIAGNOSIS — K210 Gastro-esophageal reflux disease with esophagitis: Secondary | ICD-10-CM

## 2017-07-15 DIAGNOSIS — J441 Chronic obstructive pulmonary disease with (acute) exacerbation: Secondary | ICD-10-CM

## 2017-07-15 DIAGNOSIS — J181 Lobar pneumonia, unspecified organism: Secondary | ICD-10-CM

## 2017-07-15 DIAGNOSIS — J189 Pneumonia, unspecified organism: Principal | ICD-10-CM

## 2017-07-15 LAB — BASIC METABOLIC PANEL
Anion gap: 9 (ref 5–15)
BUN: 14 mg/dL (ref 6–20)
CALCIUM: 9.1 mg/dL (ref 8.9–10.3)
CO2: 27 mmol/L (ref 22–32)
Chloride: 100 mmol/L — ABNORMAL LOW (ref 101–111)
Creatinine, Ser: 1.23 mg/dL (ref 0.61–1.24)
GFR calc Af Amer: >60 mL/min (ref >60)
GLUCOSE: 131 mg/dL — AB (ref 65–99)
POTASSIUM: 4.2 mmol/L (ref 3.5–5.1)
Sodium: 136 mmol/L (ref 135–145)

## 2017-07-15 LAB — CBC WITH DIFFERENTIAL/PLATELET
Basophils Absolute: 0 10*3/uL (ref 0.0–0.1)
Basophils Relative: 0 %
EOS PCT: 0 %
Eosinophils Absolute: 0 10*3/uL (ref 0.0–0.7)
HCT: 39.9 % (ref 39.0–52.0)
Hemoglobin: 13.6 g/dL (ref 13.0–17.0)
LYMPHS ABS: 0.6 10*3/uL — AB (ref 0.7–4.0)
LYMPHS PCT: 6 %
MCH: 30.2 pg (ref 26.0–34.0)
MCHC: 34.1 g/dL (ref 30.0–36.0)
MCV: 88.5 fL (ref 78.0–100.0)
MONO ABS: 0.2 10*3/uL (ref 0.1–1.0)
MONOS PCT: 2 %
Neutro Abs: 9.2 10*3/uL — ABNORMAL HIGH (ref 1.7–7.7)
Neutrophils Relative %: 92 %
PLATELETS: 289 10*3/uL (ref 150–400)
RBC: 4.51 MIL/uL (ref 4.22–5.81)
RDW: 15.2 % (ref 11.5–15.5)
WBC: 10 10*3/uL (ref 4.0–10.5)

## 2017-07-15 LAB — PROCALCITONIN: Procalcitonin: <0.1 ng/mL

## 2017-07-15 LAB — EXPECTORATED SPUTUM ASSESSMENT W GRAM STAIN, RFLX TO RESP C

## 2017-07-15 LAB — STREP PNEUMONIAE URINARY ANTIGEN: Strep Pneumo Urinary Antigen: NEGATIVE

## 2017-07-15 LAB — EXPECTORATED SPUTUM ASSESSMENT W REFEX TO RESP CULTURE

## 2017-07-15 MED ORDER — BENZONATATE 100 MG PO CAPS
100.0000 mg | ORAL_CAPSULE | Freq: Three times a day (TID) | ORAL | Status: DC | PRN
  Administered 2017-07-15: 100 mg via ORAL
  Filled 2017-07-15: qty 1

## 2017-07-15 MED ORDER — UMECLIDINIUM BROMIDE 62.5 MCG/INH IN AEPB
1.0000 | INHALATION_SPRAY | Freq: Every day | RESPIRATORY_TRACT | Status: DC
  Administered 2017-07-15 – 2017-07-17 (×3): 1 via RESPIRATORY_TRACT
  Filled 2017-07-15: qty 7

## 2017-07-15 MED ORDER — FLUTICASONE FUROATE-VILANTEROL 100-25 MCG/INH IN AEPB
1.0000 | INHALATION_SPRAY | Freq: Every day | RESPIRATORY_TRACT | Status: DC
  Administered 2017-07-15 – 2017-07-17 (×3): 1 via RESPIRATORY_TRACT
  Filled 2017-07-15: qty 28

## 2017-07-15 MED ORDER — CEPASTAT 14.5 MG MT LOZG
1.0000 | LOZENGE | OROMUCOSAL | Status: DC | PRN
  Filled 2017-07-15: qty 9

## 2017-07-15 MED ORDER — AZITHROMYCIN 250 MG PO TABS
500.0000 mg | ORAL_TABLET | Freq: Every day | ORAL | Status: AC
  Administered 2017-07-15: 500 mg via ORAL
  Filled 2017-07-15: qty 2

## 2017-07-15 MED ORDER — AZITHROMYCIN 250 MG PO TABS
250.0000 mg | ORAL_TABLET | Freq: Every day | ORAL | Status: DC
  Administered 2017-07-16 – 2017-07-17 (×2): 250 mg via ORAL
  Filled 2017-07-15 (×2): qty 1

## 2017-07-15 MED ORDER — TAMSULOSIN HCL 0.4 MG PO CAPS
0.4000 mg | ORAL_CAPSULE | Freq: Every day | ORAL | Status: DC
  Administered 2017-07-15 – 2017-07-17 (×3): 0.4 mg via ORAL
  Filled 2017-07-15 (×3): qty 1

## 2017-07-15 MED ORDER — PREDNISONE 20 MG PO TABS
40.0000 mg | ORAL_TABLET | Freq: Every day | ORAL | Status: DC
  Administered 2017-07-16 – 2017-07-17 (×2): 40 mg via ORAL
  Filled 2017-07-15 (×2): qty 2

## 2017-07-15 NOTE — Plan of Care (Signed)
Problem: Pain Managment: Goal: General experience of comfort will improve Outcome: Adequate for Discharge Pt c/o pain in back d/t chronic back pain. Pt has Hydrocodone ordered PRN and it has been given x1 so far this shift. Pt stated he had Chest pain, MD made aware. VSS, pt thought it may be reflux. Pt educated on pain mgmt as well as medications available. Pt verbalized understanding. Will continue to monitor pt

## 2017-07-15 NOTE — Progress Notes (Signed)
PROGRESS NOTE    Bryan [name]  DQQ:[id number] DOB: July 13, 1956 DOA: 07/14/2017 PCP: Rosita Fire, MD    Brief Narrative:  Bryan [name] is a 61 y.o. male with medical history significant for COPD with chronic hypoxic respiratory failure, chronic pain, hypertension, and GERD, now presenting to the emergency department with 2 days of dyspnea and cough, and 1 day of fevers and chills.  Patient reports that he been in his usual state of health until approximately 2 days ago when he noted the insidious development of a nonproductive cough and dyspnea.  This has progressively worsened the ensuing days and he now reports intermittent fever and chills as well.  Called EMS for transport to the hospital and was treated with 125 mg IV Solu-Medrol and prior to arrival.  ED Course: Upon arrival to the ED, patient is found to be afebrile, saturating low 90s on 4 L/min of supplemental oxygen, tachypneic in the 30s, tachycardic in the 110s, and slightly hypertensive.  EKG features a sinus tachycardia with 115 distribution and mild interstitial opacities suggestive of edema or infection.  Chemistry panel and CBC are largely unremarkable, lactic acid is reassuringly normal, troponin is slightly elevated to 0.03, and BNP is mildly elevated to 101.  Patient was treated with Levaquin, morphine, and Xopenex in the ED.  He remains slightly tachycardic and mildly tachypneic, but in no acute distress.  He will be admitted to the telemetry unit for ongoing evaluation and management dyspnea, cough, fevers, and chills, suspected secondary to pneumonia.   Assessment & Plan:   Principal Problem:   Pneumonia Active Problems:   GERD   Hypertension   Chronic pain   COPD with acute exacerbation (HCC)   CAP (community acquired pneumonia)   CAP  - Pt presents with dyspnea, cough, and fever/chills at home - Found to have leukocytosis, tachycardia, and suspected PNA on CXR  - Treated in ED with Levaquin  - procalcitonin  <0.10 - change to azithromycin  COPD exacerbation, chronic hypoxic respiratory failure  - Treated prior to arrival with 125 mg IV Solu-Medrol and albuterol neb  - Requiring 4 Lpm supplemental O2 to maintain sat in low 90's  - Sputum culture congruent with respiratory flora - continue Trelegy Ellipta and prn nebs - azithromycin - add prednisone - incentive spirometry - flutter valve  Hypertension  - BP at goal  - Continue lisinopril-HCTZ    Urinary hesistancy Start flomax  Chronic pain  - Continue home regimen with gabapentin, meloxicam, and prn Norco   DVT prophylaxis: Lovenox Code Status: Full  Family Communication: Discussed with patient Disposition Plan: Admit to telemetry   Consultants:   None  Procedures:   None  Antimicrobials:   Levaquin   Azithromycin   Subjective: Patient says he is feeling slightly better than yesterday.  Still requiring 4L [place].  Becomes breathless during conversation.  Objective: Vitals:   07/15/17 0007 07/15/17 0442 07/15/17 0952 07/15/17 1324  BP: 138/77 139/85  126/72  Pulse:  96  94  Resp:  16  19  Temp:  98.5 F (36.9 C)  98.7 F (37.1 C)  TempSrc:  Oral  Oral  SpO2:  98% 96% 98%  Weight:      Height:        Intake/Output Summary (Last 24 hours) at 07/15/17 1525 Last data filed at 07/15/17 1222  Gross per 24 hour  Intake              290 ml  Output  700 ml  Net             -410 ml   Filed Weights   07/14/17 1625 07/14/17 2142  Weight: 52.6 kg (116 lb) 51.3 kg (113 lb 3.2 oz)    Examination:  General exam: appears slightly short of breath Respiratory system: Increased respiratory effort, wheezing on expiration Cardiovascular system: S1 & S2 heard, RRR. No JVD, murmurs, rubs, gallops or clicks. No pedal edema. Gastrointestinal system: Abdomen is nondistended, soft and nontender. No organomegaly or masses felt. Normal bowel sounds heard. Central nervous system: Alert and oriented. No focal  neurological deficits. Extremities: Symmetric 5 x 5 power. Skin: No rashes, lesions or ulcers Psychiatry: Judgement and insight appear normal. Mood & affect appropriate.     Data Reviewed: I have personally reviewed following labs and imaging studies  CBC:  Recent Labs Lab 07/14/17 1655 07/15/17 0650  WBC 11.2* 10.0  NEUTROABS 9.1* 9.2*  HGB 15.0 13.6  HCT 43.9 39.9  MCV 89.6 88.5  PLT 284 938   Basic Metabolic Panel:  Recent Labs Lab 07/11/17 1300 07/14/17 1655 07/15/17 0650  NA 139 141 136  K 4.3 3.6 4.2  CL 103 100* 100*  CO2 _0 GLUCOSE 82 122* 131*  BUN _1 CREATININE 1.22 1.15 1.23  CALCIUM 10.1 9.6 9.1   GFR: Estimated Creatinine Clearance: 45.8 mL/min (by C-G formula based on SCr of 1.23 mg/dL). Liver Function Tests:  Recent Labs Lab 07/11/17 1300 07/14/17 1655  AST 16 23  ALT 12 14*  ALKPHOS  --  128*  BILITOT 0.4 0.6  PROT 7.9 9.2*  ALBUMIN  --  4.8    Recent Labs Lab 07/11/17 1300  LIPASE 54   No results for input(s): AMMONIA in the last 168 hours. Coagulation Profile: No results for input(s): INR, PROTIME in the last 168 hours. Cardiac Enzymes:  Recent Labs Lab 07/14/17 1655  TROPONINI 0.03*   BNP (last 3 results) No results for input(s): PROBNP in the last 8760 hours. HbA1C: No results for input(s): HGBA1C in the last 72 hours. CBG: No results for input(s): GLUCAP in the last 168 hours. Lipid Profile: No results for input(s): CHOL, HDL, LDLCALC, TRIG, CHOLHDL, LDLDIRECT in the last 72 hours. Thyroid Function Tests: No results for input(s): TSH, T4TOTAL, FREET4, T3FREE, THYROIDAB in the last 72 hours. Anemia Panel: No results for input(s): VITAMINB12, FOLATE, FERRITIN, TIBC, IRON, RETICCTPCT in the last 72 hours. Sepsis Labs:  Recent Labs Lab 07/14/17 1655 07/14/17 1834 07/15/17 0650  PROCALCITON  --  <0.10 <0.10  LATICACIDVEN 1.7 1.3  --     Recent Results (from the past 240 hour(s))  Culture,  sputum-assessment     Status: None   Collection Time: 07/15/17  4:51 AM  Result Value Ref Range Status   Specimen Description SPUTUM  Final   Special Requests NONE  Final   Sputum evaluation THIS SPECIMEN IS ACCEPTABLE FOR SPUTUM CULTURE  Final   Report Status 07/15/2017 FINAL  Final  Gram stain     Status: None (Preliminary result)   Collection Time: 07/15/17  4:51 AM  Result Value Ref Range Status   Specimen Description TRACHEAL SITE  Final   Special Requests PENDING  Incomplete   Gram Stain THIS SPECIMEN IS ACCEPTABLE FOR SPUTUM CULTURE  Final   Report Status PENDING  Incomplete         Radiology Studies: Dg Chest Port 1 View  Result Date: 07/14/2017 CLINICAL DATA:  Patient with shortness of breath for 2 days. EXAM: PORTABLE CHEST 1 VIEW COMPARISON:  Chest radiograph 05/16/2016. FINDINGS: Monitoring leads overlie the patient. Stable cardiac and mediastinal contours. Pulmonary vascular redistribution. Mild interstitial opacities bilaterally. No large area of pulmonary consolidation. No pleural effusion or pneumothorax. IMPRESSION: Pulmonary vascular redistribution with mild interstitial opacities which may represent mild edema. Infection not excluded. Electronically Signed   By: Lovey Newcomer M.D.   On: 07/14/2017 17:47        Scheduled Meds: . enoxaparin (LOVENOX) injection  40 mg Subcutaneous Q24H  . fluticasone furoate-vilanterol  1 puff Inhalation Daily  . gabapentin  300 mg Oral BID  . lisinopril  10 mg Oral Daily   And  . hydrochlorothiazide  12.5 mg Oral Daily  . lubiprostone  24 mcg Oral BID WC  . meloxicam  7.5 mg Oral Daily  . methylPREDNISolone (SOLU-MEDROL) injection  40 mg Intravenous Q8H  . pantoprazole  40 mg Oral QAC breakfast  . tamsulosin  0.4 mg Oral Daily  . umeclidinium bromide  1 puff Inhalation Daily   Continuous Infusions: . levofloxacin (LEVAQUIN) IV       LOS: 1 day    Time spent: 25 minutes    Loretha Stapler, MD [facility] Pager [phone]  If 7PM-7AM, please contact night-coverage [email or web address] Password [email or web address] 07/15/2017, 3:25 PM

## 2017-07-16 DIAGNOSIS — G8929 Other chronic pain: Secondary | ICD-10-CM

## 2017-07-16 LAB — GRAM STAIN

## 2017-07-16 LAB — PROCALCITONIN

## 2017-07-16 LAB — HIV ANTIBODY (ROUTINE TESTING W REFLEX): HIV Screen 4th Generation wRfx: NONREACTIVE

## 2017-07-16 MED ORDER — SALINE SPRAY 0.65 % NA SOLN
1.0000 | NASAL | Status: DC | PRN
  Administered 2017-07-16: 1 via NASAL
  Filled 2017-07-16: qty 44

## 2017-07-16 NOTE — Progress Notes (Signed)
SATURATION QUALIFICATIONS: (This note is used to comply with regulatory documentation for home oxygen)  Patient Saturations on Room Air at Rest = 93%  Patient Saturations on Room Air while Ambulating = 85%  placed on 4 L/min once back in bed, and O2 sats came back up to 93%

## 2017-07-16 NOTE — Progress Notes (Signed)
Labs ok except alk phos slightly elevated.  Patient currently inpatient with community acquired pneumonia.  Please touch base with him upon discharge. Find out if his constipation and abdominal pain are any better. If not, would consider CT A/P with contrast.

## 2017-07-16 NOTE — Progress Notes (Signed)
PROGRESS NOTE    Bryan [name]  LDJ:[id number] DOB: Oct 05, 1955 DOA: 07/14/2017 PCP: Rosita Fire, MD    Brief Narrative:  Bryan [name] is a 61 y.o. male with medical history significant for COPD with chronic hypoxic respiratory failure, chronic pain, hypertension, and GERD, now presenting to the emergency department with 2 days of dyspnea and cough, and 1 day of fevers and chills.  Patient reports that he been in his usual state of health until approximately 2 days ago when he noted the insidious development of a nonproductive cough and dyspnea.  This has progressively worsened the ensuing days and he now reports intermittent fever and chills as well.  Called EMS for transport to the hospital and was treated with 125 mg IV Solu-Medrol and prior to arrival.  ED Course: Upon arrival to the ED, patient is found to be afebrile, saturating low 90s on 4 L/min of supplemental oxygen, tachypneic in the 30s, tachycardic in the 110s, and slightly hypertensive.  EKG features a sinus tachycardia with 115 distribution and mild interstitial opacities suggestive of edema or infection.  Chemistry panel and CBC are largely unremarkable, lactic acid is reassuringly normal, troponin is slightly elevated to 0.03, and BNP is mildly elevated to 101.  Patient was treated with Levaquin, morphine, and Xopenex in the ED.  He remains slightly tachycardic and mildly tachypneic, but in no acute distress.  He will be admitted to the telemetry unit for ongoing evaluation and management dyspnea, cough, fevers, and chills, suspected secondary to pneumonia.  He was transitioned to Azithromycin after admission as physical exam more consistent with COPD exacerbation.  He required 4L [place] to maintain oxygen saturation during ambulation.  Assessment & Plan:   Principal Problem:   Pneumonia Active Problems:   GERD   Hypertension   Chronic pain   COPD with acute exacerbation (HCC)   CAP (community acquired pneumonia)   CAP  - Pt  presents with dyspnea, cough, and fever/chills at home - Found to have leukocytosis, tachycardia, and questionable PNA on CXR  - Treated in ED with Levaquin  - procalcitonin <0.10 - change to azithromycin - supplemental oxygen as needed  COPD exacerbation, chronic hypoxic respiratory failure  - Treated prior to arrival with 125 mg IV Solu-Medrol and albuterol neb  - Requiring 4 Lpm supplemental O2 to maintain sat in low 90's  - Sputum culture congruent with respiratory flora - continue Trelegy Ellipta and prn nebs - azithromycin - prednisone - incentive spirometry - flutter valve - wean supplemental oxygen as tolerated - robitussin for cough  Hypertension  - BP at goal  - Continue lisinopril-HCTZ    Urinary hesistancy - Start flomax  Chronic pain  - Continue home regimen with gabapentin, meloxicam, and prn Norco   DVT prophylaxis: Lovenox Code Status: Full  Family Communication: Discussed with patient, patient's friend is bedside Disposition Plan: Admit to telemetry   Consultants:   None  Procedures:   None  Antimicrobials:   Levaquin   Azithromycin   Subjective: Patient says his breathlessness is about the same.  Gets very short of breath with ambulation.  Voices that he feels like he can breath easier in bed just not getting up to use the restroom. Cough is still unproductive.  Objective: Vitals:   07/15/17 1324 07/15/17 2006 07/16/17 0350 07/16/17 0950  BP: 126/72 132/80 125/76   Pulse: 94 81 77   Resp: _0 Temp: 98.7 F (37.1 C) 98.4 F (36.9 C) 98.4 F (  36.9 C)   TempSrc: Oral Oral Oral   SpO2: 98% 96% 97% 96%  Weight:   51.3 kg (113 lb 2.6 oz)   Height:        Intake/Output Summary (Last 24 hours) at 07/16/2017 1256 Last data filed at 07/16/2017 0351 Gross per 24 hour  Intake -  Output 600 ml  Net -600 ml   Filed Weights   07/14/17 1625 07/14/17 2142 07/16/17 0350  Weight: 52.6 kg (116 lb) 51.3 kg (113 lb 3.2 oz) 51.3 kg  (113 lb 2.6 oz)    Examination:  General exam: appears slightly short of breath Respiratory system: Increased respiratory effort, coughing with inspiration, wheezing on expiration Cardiovascular system: S1 & S2 heard, RRR. No JVD, murmurs, rubs, gallops or clicks. No pedal edema. Gastrointestinal system: Abdomen is nondistended, soft and nontender. No organomegaly or masses felt. Normal bowel sounds heard. Central nervous system: Alert and oriented. No focal neurological deficits. Extremities: Symmetric 5 x 5 power. Skin: No rashes, lesions or ulcers Psychiatry: Judgement and insight appear normal. Mood & affect appropriate.     Data Reviewed: I have personally reviewed following labs and imaging studies  CBC: Recent Labs  Lab 07/14/17 1655 07/15/17 0650  WBC 11.2* 10.0  NEUTROABS 9.1* 9.2*  HGB 15.0 13.6  HCT 43.9 39.9  MCV 89.6 88.5  PLT 284 130   Basic Metabolic Panel: Recent Labs  Lab 07/11/17 1300 07/14/17 1655 07/15/17 0650  NA 139 141 136  K 4.3 3.6 4.2  CL 103 100* 100*  CO2 _0 GLUCOSE 82 122* 131*  BUN _1 CREATININE 1.22 1.15 1.23  CALCIUM 10.1 9.6 9.1   GFR: Estimated Creatinine Clearance: 45.8 mL/min (by C-G formula based on SCr of 1.23 mg/dL). Liver Function Tests: Recent Labs  Lab 07/11/17 1300 07/14/17 1655  AST 16 23  ALT 12 14*  ALKPHOS  --  128*  BILITOT 0.4 0.6  PROT 7.9 9.2*  ALBUMIN  --  4.8   Recent Labs  Lab 07/11/17 1300  LIPASE 54   No results for input(s): AMMONIA in the last 168 hours. Coagulation Profile: No results for input(s): INR, PROTIME in the last 168 hours. Cardiac Enzymes: Recent Labs  Lab 07/14/17 1655  TROPONINI 0.03*   BNP (last 3 results) No results for input(s): PROBNP in the last 8760 hours. HbA1C: No results for input(s): HGBA1C in the last 72 hours. CBG: No results for input(s): GLUCAP in the last 168 hours. Lipid Profile: No results for input(s): CHOL, HDL, LDLCALC, TRIG, CHOLHDL,  LDLDIRECT in the last 72 hours. Thyroid Function Tests: No results for input(s): TSH, T4TOTAL, FREET4, T3FREE, THYROIDAB in the last 72 hours. Anemia Panel: No results for input(s): VITAMINB12, FOLATE, FERRITIN, TIBC, IRON, RETICCTPCT in the last 72 hours. Sepsis Labs: Recent Labs  Lab 07/14/17 1655 07/14/17 1834 07/15/17 0650 07/16/17 0647  PROCALCITON  --  <0.10 <0.10 <0.10  LATICACIDVEN 1.7 1.3  --   --     Recent Results (from the past 240 hour(s))  Culture, sputum-assessment     Status: None   Collection Time: 07/15/17  4:51 AM  Result Value Ref Range Status   Specimen Description SPUTUM  Final   Special Requests NONE  Final   Sputum evaluation THIS SPECIMEN IS ACCEPTABLE FOR SPUTUM CULTURE  Final   Report Status 07/15/2017 FINAL  Final  Gram stain     Status: None (Preliminary result)   Collection Time: 07/15/17  4:51 AM  Result Value Ref Range Status   Specimen Description TRACHEAL SITE  Final   Special Requests PENDING  Incomplete   Gram Stain THIS SPECIMEN IS ACCEPTABLE FOR SPUTUM CULTURE  Final   Report Status PENDING  Incomplete  Culture, respiratory (NON-Expectorated)     Status: None (Preliminary result)   Collection Time: 07/15/17  4:51 AM  Result Value Ref Range Status   Specimen Description SPUTUM  Final   Special Requests NONE Reflexed from [id number]  Final   Gram Stain   Final    MODERATE WBC PRESENT, PREDOMINANTLY PMN MODERATE SQUAMOUS EPITHELIAL CELLS PRESENT MODERATE GRAM POSITIVE COCCI MODERATE GRAM POSITIVE RODS FEW GRAM NEGATIVE RODS    Culture   Final    TOO YOUNG TO READ Performed at [facility], [place] [address], [place], [place] [address]    Report Status PENDING  Incomplete         Radiology Studies: Dg Chest Port 1 View  Result Date: 07/14/2017 CLINICAL DATA:  Patient with shortness of breath for 2 days. EXAM: PORTABLE CHEST 1 VIEW COMPARISON:  Chest radiograph 05/16/2016. FINDINGS: Monitoring leads overlie the patient. Stable  cardiac and mediastinal contours. Pulmonary vascular redistribution. Mild interstitial opacities bilaterally. No large area of pulmonary consolidation. No pleural effusion or pneumothorax. IMPRESSION: Pulmonary vascular redistribution with mild interstitial opacities which may represent mild edema. Infection not excluded. Electronically Signed   By: Lovey Newcomer M.D.   On: 07/14/2017 17:47        Scheduled Meds: . azithromycin  250 mg Oral Daily  . enoxaparin (LOVENOX) injection  40 mg Subcutaneous Q24H  . fluticasone furoate-vilanterol  1 puff Inhalation Daily  . gabapentin  300 mg Oral BID  . lisinopril  10 mg Oral Daily   And  . hydrochlorothiazide  12.5 mg Oral Daily  . lubiprostone  24 mcg Oral BID WC  . meloxicam  7.5 mg Oral Daily  . pantoprazole  40 mg Oral QAC breakfast  . predniSONE  40 mg Oral Q breakfast  . tamsulosin  0.4 mg Oral Daily  . umeclidinium bromide  1 puff Inhalation Daily   Continuous Infusions:    LOS: 2 days    Time spent: 25 minutes    Loretha Stapler, MD [facility] Pager [phone]  If 7PM-7AM, please contact night-coverage [email or web address] Password [email or web address] 07/16/2017, 12:56 PM

## 2017-07-17 MED ORDER — AZITHROMYCIN 250 MG PO TABS: 250.0000 mg | ORAL_TABLET | Freq: Every day | ORAL | 0 refills | Status: AC

## 2017-07-17 MED ORDER — ALBUTEROL SULFATE HFA 108 (90 BASE) MCG/ACT IN AERS: 1.0000 | INHALATION_SPRAY | RESPIRATORY_TRACT | 0 refills | Status: DC | PRN

## 2017-07-17 MED ORDER — ALBUTEROL SULFATE (2.5 MG/3ML) 0.083% IN NEBU: 2.5000 mg | INHALATION_SOLUTION | RESPIRATORY_TRACT | 0 refills | Status: DC | PRN

## 2017-07-17 MED ORDER — BENZONATATE 100 MG PO CAPS: 100.0000 mg | ORAL_CAPSULE | Freq: Three times a day (TID) | ORAL | 0 refills | Status: DC | PRN

## 2017-07-17 MED ORDER — PREDNISONE 20 MG PO TABS: 40.0000 mg | ORAL_TABLET | Freq: Every day | ORAL | 0 refills | Status: AC

## 2017-07-17 MED ORDER — IPRATROPIUM-ALBUTEROL 0.5-2.5 (3) MG/3ML IN SOLN: 3.0000 mL | Freq: Four times a day (QID) | RESPIRATORY_TRACT | 0 refills | Status: DC | PRN

## 2017-07-17 NOTE — Care Management (Signed)
Durable Medical Equipment  (From admission, onward)        Start     Ordered   07/17/17 0920  For home use only DME oxygen  Once    Question Answer Comment  Mode or (Route) Nasal cannula   Liters per Minute 4   Frequency Continuous (stationary and portable oxygen unit needed)   Oxygen delivery system Gas      07/17/17 0921   07/16/17 1124  For home use only DME Nebulizer machine  Once    Question:  Patient needs a nebulizer to treat with the following condition  Answer:  COPD (chronic obstructive pulmonary disease) ([place])   07/16/17 1124     Pt will requires supplemental oxygen at DC due to alternate treatments (IV steroids, xopenex, incentive spirometry and flutter valve) has been tried and found to be insufficient.

## 2017-07-17 NOTE — Progress Notes (Signed)
SATURATION QUALIFICATIONS: (This note is used to comply with regulatory documentation for home oxygen)  Patient Saturations on Room Air at Rest = 94%  Patient Saturations on Room Air while Ambulating =94-95 %  Patient Saturations on 0Liters of oxygen while Ambulating = 0%  Please briefly explain why patient needs home oxygen:

## 2017-07-17 NOTE — Care Management Note (Signed)
Case Management Note  Patient Details  Name: Bryan [name] MRN: [id number] Date of Birth: 02-18-1956  Subjective/Objective:                 Admitted from home with pnuemonia. Pt lives with his son. Daughter at the bedside. Pt ind with ADL's. He has PCP he see's every three months. He drives himself to appointments. He has insurance with drug coverage. He manages his own medications. He has COPD and will need neb machine at DC. No longer qualifies for supplemental oxygen. Pt has chosen [facility] from list of DME options. Pt communicates no other needs or concerns about transition home.  Action/Plan: DC home today. Vaughan Basta, [facility] rep, aware of referral and will deliver neb  to pt room prior to DC. Pt referred for [facility] transition calls. Flyer provided with DC info.   Expected Discharge Date:     07/17/2017             Expected Discharge Plan:  Home/Self Care  In-House Referral:  NA  Discharge planning Services  CM Consult  Post Acute Care Choice:  Durable Medical Equipment Choice offered to:  Patient  DME Arranged:  Nebulizer machine DME Agency:  [facility].  Status of Service:  Completed, signed off  Sherald Barge, RN 07/17/2017, 10:32 AM

## 2017-07-17 NOTE — Care Management Important Message (Signed)
Important Message  Patient Details  Name: Bryan [name] MRN: [id number] Date of Birth: 1955-12-01   Medicare Important Message Given:  Yes    Sherald Barge, RN 07/17/2017, 10:36 AM

## 2017-07-17 NOTE — Progress Notes (Signed)
Discharge instructions read to patient.  Patient verbalized understanding of all instructions. Discharged to home with family.

## 2017-07-17 NOTE — Discharge Summary (Signed)
Physician Discharge Summary  KAYVION ARNESON MYT:[id number] DOB: 1956-06-14 DOA: 07/14/2017  PCP: Rosita Fire, MD  Admit date: 07/14/2017 Discharge date: 07/17/2017  Admitted From: Home Disposition:  Home  Recommendations for Outpatient Follow-up:  1. Follow up with PCP in 1-2 weeks 2. Follow up with Dr. Luan Pulling in 2-4 weeks 3. Home nebulizer machine set up 4. Use medications as prescribed 5. Please obtain BMP/CBC in one week   Home Health: No   Equipment/Devices: Oxygen at 4L [place], home nebulizer machine  Discharge Condition: stable CODE STATUS: Full Code Diet recommendation: Heart Healthy  Brief/Interim Summary: Virginio Isidore Loganis a 61 y.o.malewith medical history significant for COPD with chronic hypoxic respiratory failure, chronic pain, hypertension, and GERD, now presenting to the emergency department with 2 days of dyspnea and cough, and 1 day of fevers and chills. Patient reports that he been in his usual state of health until approximately 2 days ago when he noted the insidious development of a nonproductive cough and dyspnea. This has progressively worsened the ensuing days and he now reports intermittent fever and chills as well. Called EMS for transport to the hospital and was treated with 125 mg IV Solu-Medrol and prior to arrival.  ED Course:Upon arrival to the ED, patient is found to be afebrile, saturating low 90s on 4 L/min of supplemental oxygen, tachypneic in the 30s, tachycardic in the 110s, and slightly hypertensive. EKG features a sinus tachycardia with 115 distribution and mild interstitial opacities suggestive of edema or infection. Chemistry panel and CBC are largely unremarkable, lactic acid is reassuringly normal, troponin is slightly elevated to 0.03, and BNP is mildly elevated to 101. Patient was treated with Levaquin, morphine, and Xopenex in the ED. He remains slightly tachycardic and mildly tachypneic, but in no acute distress. He will be admitted to the  telemetry unit for ongoing evaluation and management dyspnea, cough, fevers, and chills, suspected secondary to pneumonia.  He was transitioned to Azithromycin after admission as physical exam more consistent with COPD exacerbation.  He required 4L [place] to maintain oxygen saturation during ambulation.  He was set up with home oxygen and a home nebulizer machine.  He was given prescriptions for his inhalers, antibiotic and steroid at time of discharge.    Discharge Diagnoses:  Principal Problem:   Pneumonia Active Problems:   GERD   Hypertension   Chronic pain   COPD with acute exacerbation (HCC)   CAP (community acquired pneumonia)    Discharge Instructions  Discharge Instructions    Call MD for:  difficulty breathing, headache or visual disturbances   Complete by:  As directed    Call MD for:  extreme fatigue   Complete by:  As directed    Call MD for:  hives   Complete by:  As directed    Call MD for:  persistant dizziness or light-headedness   Complete by:  As directed    Call MD for:  persistant nausea and vomiting   Complete by:  As directed    Call MD for:  redness, tenderness, or signs of infection (pain, swelling, redness, odor or green/yellow discharge around incision site)   Complete by:  As directed    Call MD for:  severe uncontrolled pain   Complete by:  As directed    Call MD for:  temperature >100.4   Complete by:  As directed    Diet - low sodium heart healthy   Complete by:  As directed    Discharge instructions  Complete by:  As directed    Follow up with Dr. Luan Pulling outpatient. Home oxygen set up for you Use medications as prescribed   Increase activity slowly   Complete by:  As directed      Allergies as of 07/17/2017   No Known Allergies     Medication List    STOP taking these medications   lubiprostone 24 MCG capsule Commonly known as:  AMITIZA     TAKE these medications   albuterol 108 (90 Base) MCG/ACT inhaler Commonly known as:   PROVENTIL HFA;VENTOLIN HFA Inhale 1-2 puffs into the lungs every 4 (four) hours as needed for wheezing or shortness of breath. What changed:  Another medication with the same name was added. Make sure you understand how and when to take each.   albuterol (2.5 MG/3ML) 0.083% nebulizer solution Commonly known as:  PROVENTIL Take 3 mLs (2.5 mg total) every 4 (four) hours as needed by nebulization for wheezing or shortness of breath. Use either the inhaler or the nebulizer but not both What changed:  You were already taking a medication with the same name, and this prescription was added. Make sure you understand how and when to take each.   azithromycin 250 MG tablet Commonly known as:  ZITHROMAX Take 1 tablet (250 mg total) daily for 3 days by mouth.   benzonatate 100 MG capsule Commonly known as:  TESSALON Take 1 capsule (100 mg total) 3 (three) times daily as needed by mouth for cough.   gabapentin 300 MG capsule Commonly known as:  NEURONTIN Take 300 mg by mouth 2 (two) times daily.   HYDROcodone-acetaminophen 10-325 MG tablet Commonly known as:  NORCO Take 1 tablet by mouth 3 (three) times daily as needed.   ipratropium-albuterol 0.5-2.5 (3) MG/3ML Soln Commonly known as:  DUONEB Take 3 mLs every 6 (six) hours as needed by nebulization (wheezing).   lisinopril-hydrochlorothiazide 10-12.5 MG tablet Commonly known as:  PRINZIDE,ZESTORETIC Take 1 tablet by mouth daily.   meloxicam 7.5 MG tablet Commonly known as:  MOBIC Take 7.5 mg by mouth daily as needed for pain.   pantoprazole 40 MG tablet Commonly known as:  PROTONIX Take 1 tablet (40 mg total) by mouth daily before breakfast.   predniSONE 20 MG tablet Commonly known as:  DELTASONE Take 2 tablets (40 mg total) daily with breakfast for 3 days by mouth. Start taking on:  07/18/2017   TRELEGY ELLIPTA 100-62.5-25 MCG/INH Aepb Generic drug:  Fluticasone-Umeclidin-Vilant Inhale 1 puff into the lungs daily.   UNKNOWN TO  PATIENT Take 2 tablets by mouth daily as needed. Per patient, states this medication is called 'sinus medication' - unaware of any brand name or other name; patient states that it comes in circular blue tablets            Durable Medical Equipment  (From admission, onward)        Start     Ordered   07/17/17 0920  For home use only DME oxygen  Once    Question Answer Comment  Mode or (Route) Nasal cannula   Liters per Minute 4   Frequency Continuous (stationary and portable oxygen unit needed)   Oxygen delivery system Gas      07/17/17 0921   07/16/17 1124  For home use only DME Nebulizer machine  Once    Question:  Patient needs a nebulizer to treat with the following condition  Answer:  COPD (chronic obstructive pulmonary disease) ([place])   07/16/17 1124  Follow-up Information    Rosita Fire, MD. Schedule an appointment as soon as possible for a visit in 1 week(s).   Specialty:  Internal Medicine Contact information: [place] [address] [phone]        Sinda Du, MD. Schedule an appointment as soon as possible for a visit in 2 week(s).   Specialty:  Pulmonary Disease Contact information: [place] [address] [phone]          No Known Allergies  Consultations:  CM   Procedures/Studies: Dg Chest Port 1 View  Result Date: 07/14/2017 CLINICAL DATA:  Patient with shortness of breath for 2 days. EXAM: PORTABLE CHEST 1 VIEW COMPARISON:  Chest radiograph 05/16/2016. FINDINGS: Monitoring leads overlie the patient. Stable cardiac and mediastinal contours. Pulmonary vascular redistribution. Mild interstitial opacities bilaterally. No large area of pulmonary consolidation. No pleural effusion or pneumothorax. IMPRESSION: Pulmonary vascular redistribution with mild interstitial opacities which may represent mild edema. Infection not excluded. Electronically Signed   By: Lovey Newcomer M.D.   On:  07/14/2017 17:47      Subjective: Patient reports he is feeling well.  Hoping to get to go home today.  Says he was supposed to be on oxygen but due to kerosene heat had oxygen removed from his house.    Discharge Exam: Vitals:   07/17/17 0536 07/17/17 0917  BP: 120/70   Pulse: 88   Resp: 18   Temp: 98.4 F (36.9 C)   SpO2: 100% 95%   Vitals:   07/16/17 1851 07/16/17 2130 07/17/17 0536 07/17/17 0917  BP: 118/63 107/61 120/70   Pulse: 84 76 88   Resp: _0 Temp: 98.9 F (37.2 C) 98.5 F (36.9 C) 98.4 F (36.9 C)   TempSrc: Oral Oral Oral   SpO2: 99% 98% 100% 95%  Weight:      Height:        General: Pt is alert, awake, not in acute distress Cardiovascular: RRR, S1/S2 +, no rubs, no gallops Respiratory: no increase in respiratory effort, end expiratory wheezing,  Few rhonchi Abdominal: Soft, NT, ND, bowel sounds + Extremities: no edema, no cyanosis    The results of significant diagnostics from this hospitalization (including imaging, microbiology, ancillary and laboratory) are listed below for reference.     Microbiology: Recent Results (from the past 240 hour(s))  Culture, sputum-assessment     Status: None   Collection Time: 07/15/17  4:51 AM  Result Value Ref Range Status   Specimen Description SPUTUM  Final   Special Requests NONE  Final   Sputum evaluation THIS SPECIMEN IS ACCEPTABLE FOR SPUTUM CULTURE  Final   Report Status 07/15/2017 FINAL  Final  Gram stain     Status: None   Collection Time: 07/15/17  4:51 AM  Result Value Ref Range Status   Specimen Description EXPECTORATED SPUTUM  Final   Special Requests NONE  Final   Gram Stain   Final    THIS SPECIMEN IS ACCEPTABLE FOR SPUTUM CULTURE DUPLICATE REQUEST TEST WILL BE CREDITED    Report Status 07/16/2017 FINAL  Final  Culture, respiratory (NON-Expectorated)     Status: None (Preliminary result)   Collection Time: 07/15/17  4:51 AM  Result Value Ref Range Status   Specimen Description  SPUTUM  Final   Special Requests NONE Reflexed from [id number]  Final   Gram Stain   Final    MODERATE WBC PRESENT, PREDOMINANTLY PMN MODERATE SQUAMOUS EPITHELIAL  CELLS PRESENT MODERATE GRAM POSITIVE COCCI MODERATE GRAM POSITIVE RODS FEW GRAM NEGATIVE RODS    Culture   Final    CULTURE REINCUBATED FOR BETTER GROWTH Performed at [facility], [place] [address], [place], [place]  [address]    Report Status PENDING  Incomplete     Labs: BNP (last 3 results) Recent Labs    07/14/17 1655  BNP 283.6*   Basic Metabolic Panel: Recent Labs  Lab 07/11/17 1300 07/14/17 1655 07/15/17 0650  NA 139 141 136  K 4.3 3.6 4.2  CL 103 100* 100*  CO2 _0 GLUCOSE 82 122* 131*  BUN _1 CREATININE 1.22 1.15 1.23  CALCIUM 10.1 9.6 9.1   Liver Function Tests: Recent Labs  Lab 07/11/17 1300 07/14/17 1655  AST 16 23  ALT 12 14*  ALKPHOS  --  128*  BILITOT 0.4 0.6  PROT 7.9 9.2*  ALBUMIN  --  4.8   Recent Labs  Lab 07/11/17 1300  LIPASE 54   No results for input(s): AMMONIA in the last 168 hours. CBC: Recent Labs  Lab 07/14/17 1655 07/15/17 0650  WBC 11.2* 10.0  NEUTROABS 9.1* 9.2*  HGB 15.0 13.6  HCT 43.9 39.9  MCV 89.6 88.5  PLT 284 289   Cardiac Enzymes: Recent Labs  Lab 07/14/17 1655  TROPONINI 0.03*   BNP: Invalid input(s): POCBNP CBG: No results for input(s): GLUCAP in the last 168 hours. D-Dimer No results for input(s): DDIMER in the last 72 hours. Hgb A1c No results for input(s): HGBA1C in the last 72 hours. Lipid Profile No results for input(s): CHOL, HDL, LDLCALC, TRIG, CHOLHDL, LDLDIRECT in the last 72 hours. Thyroid function studies No results for input(s): TSH, T4TOTAL, T3FREE, THYROIDAB in the last 72 hours.  Invalid input(s): FREET3 Anemia work up No results for input(s): VITAMINB12, FOLATE, FERRITIN, TIBC, IRON, RETICCTPCT in the last 72 hours. Urinalysis    Component Value Date/Time   COLORURINE STRAW (A) 12/01/2016 1935    APPEARANCEUR CLEAR 12/01/2016 1935   LABSPEC 1.009 12/01/2016 1935   PHURINE 5.0 12/01/2016 1935   GLUCOSEU NEGATIVE 12/01/2016 1935   HGBUR NEGATIVE 12/01/2016 1935   BILIRUBINUR NEGATIVE 12/01/2016 1935   KETONESUR NEGATIVE 12/01/2016 1935   PROTEINUR NEGATIVE 12/01/2016 1935   UROBILINOGEN 0.2 07/08/2015 1314   NITRITE NEGATIVE 12/01/2016 1935   LEUKOCYTESUR NEGATIVE 12/01/2016 1935   Sepsis Labs Invalid input(s): PROCALCITONIN,  WBC,  LACTICIDVEN Microbiology Recent Results (from the past 240 hour(s))  Culture, sputum-assessment     Status: None   Collection Time: 07/15/17  4:51 AM  Result Value Ref Range Status   Specimen Description SPUTUM  Final   Special Requests NONE  Final   Sputum evaluation THIS SPECIMEN IS ACCEPTABLE FOR SPUTUM CULTURE  Final   Report Status 07/15/2017 FINAL  Final  Gram stain     Status: None   Collection Time: 07/15/17  4:51 AM  Result Value Ref Range Status   Specimen Description EXPECTORATED SPUTUM  Final   Special Requests NONE  Final   Gram Stain   Final    THIS SPECIMEN IS ACCEPTABLE FOR SPUTUM CULTURE DUPLICATE REQUEST TEST WILL BE CREDITED    Report Status 07/16/2017 FINAL  Final  Culture, respiratory (NON-Expectorated)     Status: None (Preliminary result)   Collection Time: 07/15/17  4:51 AM  Result Value Ref Range Status   Specimen Description SPUTUM  Final   Special Requests NONE Reflexed from [id number]  Final  Gram Stain   Final    MODERATE WBC PRESENT, PREDOMINANTLY PMN MODERATE SQUAMOUS EPITHELIAL CELLS PRESENT MODERATE GRAM POSITIVE COCCI MODERATE GRAM POSITIVE RODS FEW GRAM NEGATIVE RODS    Culture   Final    CULTURE REINCUBATED FOR BETTER GROWTH Performed at [facility], [place] [address] [place], [place] [address]    Report Status PENDING  Incomplete     Time coordinating discharge: 40 minutes  SIGNED:   Loretha Stapler, MD  [facility] 07/17/2017, 12:39 PM Pager [phone] If 7PM-7AM, please  contact night-coverage [email or web address] Password [email or web address]

## 2017-07-18 LAB — CULTURE, RESPIRATORY: CULTURE: NORMAL

## 2017-07-18 LAB — CULTURE, RESPIRATORY W GRAM STAIN

## 2017-07-20 NOTE — Progress Notes (Signed)
Glad to here he is feeling better.  Return ov in 2 months. Plans as outlined at time of ov.

## 2017-07-21 ENCOUNTER — Other Ambulatory Visit: Payer: Self-pay

## 2017-07-21 NOTE — Patient Outreach (Signed)
[facility] [facility]) Care Management  07/21/2017  Bryan [name] [birth year] [id number]   EMMI: Pneumonia Referral Date: 07/21/17 Referral Source: EMMI Referral Reason: Been to follow up appointment-no More Short of breath than yesterday-yes Day # 3   Outreach attempt # 1  Spoke with patient he is able to verify HIPAA.  Patient reports he is doing well.   Reviewed and addressed red alert.  Patient does have an appointment with Dr. Luan Pulling on 08/01/17.  Patient reports that he does have an appointment with Dr. Legrand Rams on 08/11/17.  Advised patient his PCP appointment is pretty far out but that he does have an appointment with the pulmonologist soon.  He verbalized understanding. Patient denies shortness of breath, chills, or fever.  He states he barely has a cough.  Discussed with patient signs of worsening pneumonia and how to obtain help. He verbalized understanding   Plan: RN CM will close case at this time as no needs identified and patient has no concerns.   RN CM will notify care management assistant of case status.    Jone Baseman, RN, MSN [facility] Care Management Care Management Coordinator Direct Line [phone] Toll Free: [phone]  Fax: [phone]

## 2017-07-22 ENCOUNTER — Encounter (HOSPITAL_COMMUNITY): Payer: Self-pay | Admitting: Emergency Medicine

## 2017-07-22 ENCOUNTER — Emergency Department (HOSPITAL_COMMUNITY): Payer: Medicare Other

## 2017-07-22 ENCOUNTER — Observation Stay (HOSPITAL_COMMUNITY): Payer: Medicare Other

## 2017-07-22 ENCOUNTER — Other Ambulatory Visit: Payer: Self-pay

## 2017-07-22 ENCOUNTER — Observation Stay (HOSPITAL_COMMUNITY)
Admission: EM | Admit: 2017-07-22 | Discharge: 2017-07-23 | Disposition: A | Discharge status: Home / Self Care | Payer: Medicare Other | Attending: Internal Medicine | Admitting: Internal Medicine

## 2017-07-22 DIAGNOSIS — I10 Essential (primary) hypertension: Secondary | ICD-10-CM | POA: Insufficient documentation

## 2017-07-22 DIAGNOSIS — J45909 Unspecified asthma, uncomplicated: Secondary | ICD-10-CM | POA: Insufficient documentation

## 2017-07-22 DIAGNOSIS — R079 Chest pain, unspecified: Secondary | ICD-10-CM | POA: Diagnosis not present

## 2017-07-22 DIAGNOSIS — R200 Anesthesia of skin: Secondary | ICD-10-CM | POA: Diagnosis not present

## 2017-07-22 DIAGNOSIS — G459 Transient cerebral ischemic attack, unspecified: Secondary | ICD-10-CM | POA: Diagnosis present

## 2017-07-22 DIAGNOSIS — G8929 Other chronic pain: Secondary | ICD-10-CM | POA: Insufficient documentation

## 2017-07-22 DIAGNOSIS — F1010 Alcohol abuse, uncomplicated: Secondary | ICD-10-CM | POA: Insufficient documentation

## 2017-07-22 DIAGNOSIS — R072 Precordial pain: Secondary | ICD-10-CM

## 2017-07-22 DIAGNOSIS — R29818 Other symptoms and signs involving the nervous system: Secondary | ICD-10-CM | POA: Diagnosis not present

## 2017-07-22 DIAGNOSIS — J449 Chronic obstructive pulmonary disease, unspecified: Secondary | ICD-10-CM | POA: Diagnosis not present

## 2017-07-22 DIAGNOSIS — I639 Cerebral infarction, unspecified: Principal | ICD-10-CM | POA: Insufficient documentation

## 2017-07-22 DIAGNOSIS — I720 Aneurysm of carotid artery: Secondary | ICD-10-CM | POA: Diagnosis not present

## 2017-07-22 DIAGNOSIS — Z8711 Personal history of peptic ulcer disease: Secondary | ICD-10-CM

## 2017-07-22 DIAGNOSIS — N183 Hypertensive chronic kidney disease with stage 1 through stage 4 chronic kidney disease, or unspecified chronic kidney disease: Secondary | ICD-10-CM | POA: Diagnosis present

## 2017-07-22 DIAGNOSIS — Z87891 Personal history of nicotine dependence: Secondary | ICD-10-CM | POA: Diagnosis not present

## 2017-07-22 DIAGNOSIS — R202 Paresthesia of skin: Secondary | ICD-10-CM | POA: Diagnosis not present

## 2017-07-22 HISTORY — DX: Pneumonia, unspecified organism: J18.9

## 2017-07-22 LAB — URINALYSIS, ROUTINE W REFLEX MICROSCOPIC
Bilirubin Urine: NEGATIVE
GLUCOSE, UA: NEGATIVE mg/dL
HGB URINE DIPSTICK: NEGATIVE
Ketones, ur: NEGATIVE mg/dL
Leukocytes, UA: NEGATIVE
Nitrite: NEGATIVE
PROTEIN: NEGATIVE mg/dL
SPECIFIC GRAVITY, URINE: 1.01 (ref 1.005–1.030)
pH: 5 (ref 5.0–8.0)

## 2017-07-22 LAB — CBC
HCT: 44.7 % (ref 39.0–52.0)
HCT: 39 % (ref 39.0–52.0)
HEMOGLOBIN: 13.5 g/dL (ref 13.0–17.0)
Hemoglobin: 15.8 g/dL (ref 13.0–17.0)
MCH: 31.1 pg (ref 26.0–34.0)
MCH: 30.3 pg (ref 26.0–34.0)
MCHC: 35.3 g/dL (ref 30.0–36.0)
MCHC: 34.6 g/dL (ref 30.0–36.0)
MCV: 88 fL (ref 78.0–100.0)
MCV: 87.4 fL (ref 78.0–100.0)
PLATELETS: 350 10*3/uL (ref 150–400)
Platelets: 284 10*3/uL (ref 150–400)
RBC: 5.08 MIL/uL (ref 4.22–5.81)
RBC: 4.46 MIL/uL (ref 4.22–5.81)
RDW: 14.6 % (ref 11.5–15.5)
RDW: 14.5 % (ref 11.5–15.5)
WBC: 13.5 10*3/uL — ABNORMAL HIGH (ref 4.0–10.5)
WBC: 11.8 10*3/uL — ABNORMAL HIGH (ref 4.0–10.5)

## 2017-07-22 LAB — DIFFERENTIAL
BASOS ABS: 0 10*3/uL (ref 0.0–0.1)
BASOS PCT: 0 %
EOS ABS: 0.4 10*3/uL (ref 0.0–0.7)
Eosinophils Relative: 3 %
Lymphocytes Relative: 27 %
Lymphs Abs: 3.6 10*3/uL (ref 0.7–4.0)
MONOS PCT: 9 %
Monocytes Absolute: 1.3 10*3/uL — ABNORMAL HIGH (ref 0.1–1.0)
NEUTROS PCT: 61 %
Neutro Abs: 8.2 10*3/uL — ABNORMAL HIGH (ref 1.7–7.7)

## 2017-07-22 LAB — I-STAT CHEM 8, ED
BUN: 19 mg/dL (ref 6–20)
CHLORIDE: 98 mmol/L — AB (ref 101–111)
Calcium, Ion: 1.07 mmol/L — ABNORMAL LOW (ref 1.15–1.40)
Creatinine, Ser: 1.5 mg/dL — ABNORMAL HIGH (ref 0.61–1.24)
Glucose, Bld: 166 mg/dL — ABNORMAL HIGH (ref 65–99)
HEMATOCRIT: 49 % (ref 39.0–52.0)
HEMOGLOBIN: 16.7 g/dL (ref 13.0–17.0)
POTASSIUM: 3.2 mmol/L — AB (ref 3.5–5.1)
Sodium: 137 mmol/L (ref 135–145)
TCO2: 25 mmol/L (ref 22–32)

## 2017-07-22 LAB — COMPREHENSIVE METABOLIC PANEL
ALBUMIN: 3.9 g/dL (ref 3.5–5.0)
ALT: 25 U/L (ref 17–63)
AST: 19 U/L (ref 15–41)
Alkaline Phosphatase: 99 U/L (ref 38–126)
Anion gap: 10 (ref 5–15)
BUN: 20 mg/dL (ref 6–20)
CHLORIDE: 100 mmol/L — AB (ref 101–111)
CO2: 26 mmol/L (ref 22–32)
Calcium: 9.3 mg/dL (ref 8.9–10.3)
Creatinine, Ser: 1.44 mg/dL — ABNORMAL HIGH (ref 0.61–1.24)
GFR calc Af Amer: 59 mL/min — ABNORMAL LOW (ref >60)
GFR, EST NON AFRICAN AMERICAN: 51 mL/min — AB (ref >60)
Glucose, Bld: 162 mg/dL — ABNORMAL HIGH (ref 65–99)
POTASSIUM: 3.4 mmol/L — AB (ref 3.5–5.1)
SODIUM: 136 mmol/L (ref 135–145)
Total Bilirubin: 0.6 mg/dL (ref 0.3–1.2)
Total Protein: 7.1 g/dL (ref 6.5–8.1)

## 2017-07-22 LAB — RAPID URINE DRUG SCREEN, HOSP PERFORMED
Amphetamines: NOT DETECTED
BARBITURATES: NOT DETECTED
BENZODIAZEPINES: NOT DETECTED
COCAINE: NOT DETECTED
Opiates: POSITIVE — AB
Tetrahydrocannabinol: POSITIVE — AB

## 2017-07-22 LAB — APTT: APTT: 26 s (ref 24–36)

## 2017-07-22 LAB — TROPONIN I: Troponin I: <0.03 ng/mL (ref <0.03)

## 2017-07-22 LAB — CREATININE, SERUM
CREATININE: 1.39 mg/dL — AB (ref 0.61–1.24)
GFR, EST NON AFRICAN AMERICAN: 53 mL/min — AB (ref >60)

## 2017-07-22 LAB — ETHANOL

## 2017-07-22 LAB — PROTIME-INR
INR: 0.96
Prothrombin Time: 12.7 seconds (ref 11.4–15.2)

## 2017-07-22 LAB — I-STAT TROPONIN, ED: TROPONIN I, POC: 0 ng/mL (ref 0.00–0.08)

## 2017-07-22 MED ORDER — STROKE: EARLY STAGES OF RECOVERY BOOK
Freq: Once | Status: AC
  Administered 2017-07-22
  Filled 2017-07-22: qty 1

## 2017-07-22 MED ORDER — ASPIRIN 300 MG RE SUPP: 300.0000 mg | Freq: Every day | RECTAL | Status: DC

## 2017-07-22 MED ORDER — SODIUM CHLORIDE 0.9 % IV SOLN
100.0000 mL/h | INTRAVENOUS | Status: DC
  Administered 2017-07-22: 100 mL/h via INTRAVENOUS

## 2017-07-22 MED ORDER — BENZONATATE 100 MG PO CAPS: 100.0000 mg | ORAL_CAPSULE | Freq: Three times a day (TID) | ORAL | Status: DC | PRN

## 2017-07-22 MED ORDER — IPRATROPIUM-ALBUTEROL 0.5-2.5 (3) MG/3ML IN SOLN: 3.0000 mL | Freq: Four times a day (QID) | RESPIRATORY_TRACT | Status: DC | PRN

## 2017-07-22 MED ORDER — ASPIRIN 325 MG PO TABS
325.0000 mg | ORAL_TABLET | Freq: Every day | ORAL | Status: DC
  Administered 2017-07-22 – 2017-07-23 (×2): 325 mg via ORAL
  Filled 2017-07-22 (×2): qty 1

## 2017-07-22 MED ORDER — ACETAMINOPHEN 325 MG PO TABS: 650.0000 mg | ORAL_TABLET | ORAL | Status: DC | PRN

## 2017-07-22 MED ORDER — ACETAMINOPHEN 160 MG/5ML PO SOLN: 650.0000 mg | ORAL | Status: DC | PRN

## 2017-07-22 MED ORDER — TAMSULOSIN HCL 0.4 MG PO CAPS
0.4000 mg | ORAL_CAPSULE | Freq: Every day | ORAL | Status: DC
  Administered 2017-07-23: 0.4 mg via ORAL
  Filled 2017-07-22: qty 1

## 2017-07-22 MED ORDER — SODIUM CHLORIDE 0.9 % IV BOLUS (SEPSIS)
500.0000 mL | Freq: Once | INTRAVENOUS | Status: AC
  Administered 2017-07-22: 500 mL via INTRAVENOUS

## 2017-07-22 MED ORDER — ENOXAPARIN SODIUM 40 MG/0.4ML ~~LOC~~ SOLN
40.0000 mg | SUBCUTANEOUS | Status: DC
  Administered 2017-07-22: 40 mg via SUBCUTANEOUS
  Filled 2017-07-22: qty 0.4

## 2017-07-22 MED ORDER — PANTOPRAZOLE SODIUM 40 MG PO TBEC
40.0000 mg | DELAYED_RELEASE_TABLET | Freq: Every day | ORAL | Status: DC
  Administered 2017-07-23: 40 mg via ORAL
  Filled 2017-07-22 (×2): qty 1

## 2017-07-22 MED ORDER — HYDROCODONE-ACETAMINOPHEN 10-325 MG PO TABS: 1.0000 | ORAL_TABLET | Freq: Four times a day (QID) | ORAL | Status: DC | PRN

## 2017-07-22 MED ORDER — ACETAMINOPHEN 650 MG RE SUPP: 650.0000 mg | RECTAL | Status: DC | PRN

## 2017-07-22 MED ORDER — LUBIPROSTONE 24 MCG PO CAPS
24.0000 ug | ORAL_CAPSULE | Freq: Two times a day (BID) | ORAL | Status: DC
  Administered 2017-07-22 – 2017-07-23 (×2): 24 ug via ORAL
  Filled 2017-07-22 (×2): qty 1

## 2017-07-22 MED ORDER — ALPRAZOLAM 0.5 MG PO TABS
1.0000 mg | ORAL_TABLET | Freq: Once | ORAL | Status: AC
  Administered 2017-07-22: 1 mg via ORAL
  Filled 2017-07-22: qty 2

## 2017-07-22 MED ORDER — GABAPENTIN 300 MG PO CAPS
300.0000 mg | ORAL_CAPSULE | Freq: Two times a day (BID) | ORAL | Status: DC
  Administered 2017-07-22 – 2017-07-23 (×2): 300 mg via ORAL
  Filled 2017-07-22 (×2): qty 1

## 2017-07-22 MED ORDER — SENNOSIDES-DOCUSATE SODIUM 8.6-50 MG PO TABS: 1.0000 | ORAL_TABLET | Freq: Every evening | ORAL | Status: DC | PRN

## 2017-07-22 NOTE — ED Notes (Signed)
EDP made aware of stroke like symptoms. EDP in room to assess patient.

## 2017-07-22 NOTE — ED Notes (Signed)
Per Dr. Lucia Gaskins, pt not a candidate for TPA recommends admission.

## 2017-07-22 NOTE — ED Notes (Signed)
SOC Tora Kindred, RN evaluating patient.

## 2017-07-22 NOTE — H&P (Addendum)
[facility] History and Physical  Bryan [name] QGB:[id number] DOB: 1956/04/01 DOA: 07/22/2017  Referring physician: EDP PCP: Rosita Fire, MD   Chief Complaint: left side numbness  HPI: Bryan [name] is a 61 y.o. male  with history of COPD, hypertension, GERD was just discharged from [facility] last week after hospitalization for COPD exacerbation.  Patient reports being in his usual state of health, but felt a little different this morning and then around 9am suddenly experienced left-sided numbness and weakness, since this persisted he presented to the emergency room for evaluation Patient also reports that he has long-standing burning and tingling numbness in his hands and feet however that is different from what he experienced today involving his left arm and left leg. In addition patient also reports some weakness/ heaviness on his left side.   Review of Systems: positive for transient chest discomfort early this morning-resolved, otherwise 14 point RoS negative except per HPI   Past Medical History:  Diagnosis Date  . Acid reflux   . Arthritis   . Asthma   . Chronic pain    with leg and back pain (disc problem)  . COPD (chronic obstructive pulmonary disease) ([place])   . History of upper GI x-ray series    to follow showed large duodenal ulcer H pylori serologies were negative  . Hypertension   . Pneumonia   . Tubular adenoma    Past Surgical History:  Procedure Laterality Date  . BACK SURGERY  7/05; 5/09    Dr.Hirsch,3 lumbar  . BACK SURGERY    . COLONOSCOPY  Feb 2012   Dr. Gala Romney: normal rectum, pedunculate polyp removed but not recovered  . ESOPHAGOGASTRODUODENOSCOPY  1/06   Dr. Volney American esophageal erosions,U-shaped stomach,marked erosions and edema of the bulb without discrete ulcer disease.   Marland Kitchen NECK SURGERY  2/09   Social History:  reports that he quit smoking about 2 years ago. His smoking use included cigarettes. He has a 20.00 pack-year smoking  history. he has never used smokeless tobacco. He reports that he uses drugs. Drug: Marijuana. He reports that he does not drink alcohol.  No Known Allergies  Family History  Problem Relation Age of Onset  . Cancer Father   . Asthma Mother   . Colon cancer Neg Hx    Prior to Admission medications   Medication Sig Start Date End Date Taking? Authorizing Provider  albuterol (PROVENTIL HFA;VENTOLIN HFA) 108 (90 Base) MCG/ACT inhaler Inhale 1-2 puffs every 4 (four) hours as needed into the lungs for wheezing or shortness of breath. Do not use with nebulizer 07/17/17   [name], MD  albuterol (PROVENTIL) (2.5 MG/3ML) 0.083% nebulizer solution Take 3 mLs (2.5 mg total) every 4 (four) hours as needed by nebulization for wheezing or shortness of breath. 07/17/17   [name], MD  benzonatate (TESSALON) 100 MG capsule Take 1 capsule (100 mg total) 3 (three) times daily as needed by mouth for cough. 07/17/17   [name], MD  gabapentin (NEURONTIN) 300 MG capsule Take 300 mg by mouth 2 (two) times daily.    [provider]  HYDROcodone-acetaminophen (NORCO) 10-325 MG tablet Take 1 tablet by mouth 3 (three) times daily as needed. 06/08/17   [provider]  ipratropium-albuterol (DUONEB) 0.5-2.5 (3) MG/3ML SOLN Take 3 mLs every 6 (six) hours as needed by nebulization (wheezing). 07/17/17   [name], MD  lisinopril-hydrochlorothiazide (PRINZIDE,ZESTORETIC) 10-12.5 MG tablet Take 1 tablet by mouth daily.  [provider]  meloxicam (MOBIC) 7.5 MG tablet Take 7.5 mg by mouth daily as needed for pain.     [provider]  pantoprazole (PROTONIX) 40 MG tablet Take 1 tablet (40 mg total) by mouth daily before breakfast. 05/01/17   Mahala Menghini, PA-C  TRELEGY ELLIPTA 100-62.5-25 MCG/INH AEPB Inhale 1 puff into the lungs daily.  05/04/17   [provider]  UNKNOWN TO PATIENT Take 2 tablets by mouth daily as needed. Per  patient, states this medication is called 'sinus medication' - unaware of any brand name or other name; patient states that it comes in circular blue tablets    [provider]   Physical Exam: Vitals:   07/22/17 1330 07/22/17 1331 07/22/17 1345 07/22/17 1415  BP: 108/66 108/66 102/70 105/69  Pulse: 86 91 90 88  Resp: (!) _0 (!) 26  Temp:      TempSrc:      SpO2: 92% 92% 94% 93%  Weight:      Height:        Wt Readings from Last 3 Encounters:  07/22/17 51.3 kg (113 lb)  07/16/17 51.3 kg (113 lb 2.6 oz)  07/10/17 52.8 kg (116 lb 6.4 oz)    General:   Thinly built African-American  Male, appears calm and comfortable Eyes: PERRL, normal lids, irises & conjunctiva ENT: grossly normal hearing, lips & tongue Neck: no LAD, masses or thyromegaly Cardiovascular: RRR, no m/r/g. No LE edema. Telemetry: SR, no arrhythmias  Respiratory: CTA bilaterally, no w/r/r. Normal respiratory effort. Abdomen: soft, ntnd Skin: no rash or induration seen on limited exam Musculoskeletal: grossly normal tone BUE/BLE Psychiatric: grossly normal mood and affect, speech fluent and appropriate Neurologic: grossly non-focal.          Labs on Admission:  Basic Metabolic Panel: Recent Labs  Lab 07/22/17 1300 07/22/17 1304  NA 136 137  K 3.4* 3.2*  CL 100* 98*  CO2 26  --   GLUCOSE 162* 166*  BUN 20 19  CREATININE 1.44* 1.50*  CALCIUM 9.3  --    Liver Function Tests: Recent Labs  Lab 07/22/17 1300  AST 19  ALT 25  ALKPHOS 99  BILITOT 0.6  PROT 7.1  ALBUMIN 3.9   No results for input(s): LIPASE, AMYLASE in the last 168 hours. No results for input(s): AMMONIA in the last 168 hours. CBC: Recent Labs  Lab 07/22/17 1300 07/22/17 1304  WBC 13.5*  --   NEUTROABS 8.2*  --   HGB 15.8 16.7  HCT 44.7 49.0  MCV 88.0  --   PLT 350  --    Cardiac Enzymes: Recent Labs  Lab 07/22/17 1300  TROPONINI <0.03    BNP (last 3 results) Recent Labs    07/14/17 1655  BNP  101.0*    ProBNP (last 3 results) No results for input(s): PROBNP in the last 8760 hours.  CBG: No results for input(s): GLUCAP in the last 168 hours.  Radiological Exams on Admission: Dg Chest 2 View  Result Date: 07/22/2017 CLINICAL DATA:  Chest pain. EXAM: CHEST  2 VIEW COMPARISON:  July 14, 2017 FINDINGS: The heart, hila, and mediastinum are normal. No pneumothorax. No nodules or masses. No focal infiltrates. No overt edema. IMPRESSION: No active cardiopulmonary disease. Electronically Signed   By: Dorise Bullion III M.D   On: 07/22/2017 13:59   Ct Head Code Stroke Wo Contrast  Result Date: 07/22/2017 CLINICAL DATA:  Code stroke. Focal neurologic deficit less than  6 hours. Suspect stroke EXAM: CT HEAD WITHOUT CONTRAST TECHNIQUE: Contiguous axial images were obtained from the base of the skull through the vertex without intravenous contrast. COMPARISON:  None. FINDINGS: Brain: No evidence of acute infarction, hemorrhage, hydrocephalus, extra-axial collection or mass lesion/mass effect. Vascular: Negative for hyperdense vessel Skull: Negative Sinuses/Orbits: Negative Other: None ASPECTS ([place] Stroke Program Early CT Score) - Ganglionic level infarction (caudate, lentiform nuclei, internal capsule, insula, M1-M3 cortex): 7 - Supraganglionic infarction (M4-M6 cortex): 3 Total score (0-10 with 10 being normal): 10 IMPRESSION: 1. Negative CT Head 2. ASPECTS is 10 These results were called by telephone at the time of interpretation on 07/22/2017 at 1:13 pm to Dr. Lacinda Axon , who verbally acknowledged these results. Electronically Signed   By: Franchot Gallo M.D.   On: 07/22/2017 13:14    EKG: Independently reviewed. NSR, no acute ST T wave changes  Assessment/Plan    Left sided numbness - suspect TIA versus CVA -ASA 360m daily or 3059mrectal pending swallow screen - [facility] - Telemetry neurology input  appreciated - Check MRI/MRA pain, 2-D echocardiogram and carotid duplex - check  Fasting lipid panel and hemoglobin A1c - PT OT and SLP eval    Transient chest pain - resolved,  EKG without acute changes and troponin 1 negative -will repeat troponin and check 2-D echocardiogram    Chronic tingling numbess of both feet -denies ETOH abuse -check Hba1c and B12     Hyperglycemia - could be from recent predisone, check hemoglobin A1c -strong family history of diabetes    COPD - stable, nebs when necessary   Hypertension - blood pressure and low 100 range will hold antihypertensives to allow for permissible hypertsion in the setting of possible TIA versus acute stroke   Chronic pain/low back pain -continue hydrocodone and Gabapentin per home regimen  Code Status: Full COde DVT Prophylaxis:Lovenox Family Communication: sister at bedside [facility]: [facility] 6036m Faye Strohman [facility] Page via [employer]m, password [facility]

## 2017-07-22 NOTE — ED Triage Notes (Signed)
Patient c/o left arm and mouth numbness with dizziness that started 2 hours ago. Patient states generalized weakness and burning sensation in both legs bilateral with body aches and cough. Patient states recently discharged on 11/2 from here after being diagnosed and treated for pneumonia. Denies any slurred speech or facial drooping.

## 2017-07-22 NOTE — ED Notes (Signed)
Cindie Laroche, MD [facility] evaluating patient.

## 2017-07-22 NOTE — Progress Notes (Signed)
CODE STROKE 12:55 Call time 12:59 Exam started 13:01 exam finished, images sent to [facility] 13:03 EXAM completed in Epic. [facility] Radiology called spoke with Marzetta Board

## 2017-07-22 NOTE — Consult Note (Signed)
   [facility] TeleNeurology Consult Services  Impression: Possible lacunar stroke right hemisphere    Not a tPA candidate due to: Minimal did nondisabling neurologic deficit, outside of acceptable window of opportunity Not clearly an NIR candidate due to: No evidence of large vessel occlusion  Differential Diagnosis:   1. Cardioembolic stroke  2. Small vessel disease/lacune  3. Thromboembolic, artery-to-artery mechanism  4. Hypercoagulable state-related infarct  5. Transient ischemic attack  6. Thrombotic mechanism, large artery disease    Comments:   [facility] contacted: 1307 [facility] at bedside: 1313 NIHSS assessment time: 1324 Recommendations:   Inpatient Neurology consultation Stroke evaluation per Neurology/Internal Medicine Discussed with ED MD Needs MRI, TTE, carotid ultrasound, antiplatelet therapy.  ----------------------------------------------------------------------------------------- CC: Left-sided numbness and weakness  History of Present Illness: This is a 61 year old man who was normal upon waking this morning but 0900 he noticed numbness and weakness of the left arm and possibly the leg. He has some double vision which resolved. He had no vision loss or difficulty speaking. He has a history of hypertension but his blood pressure was 90/70. He has no history of diabetes, coronary disease, atrial fibrillation, or hyperlipidemia. He is not on antiplatelet or anticoagulant medication. He's a non-smoker. He is disabled from back injuries.   Diagnostic: I reviewed his CT brain which was unremarkable and showed no evidence of acute ischemic cytotoxic edema, hemorrhage, or mass lesion.  Exam  Level of Consciousness (1): Alert, keenly responsive = 0 Level of Consciousness (2):  Ask Month and Age: Both questions right = 0 Level of Consciousness (3) (Commands): Blink Eyes/Squeeze Hands: Performs both = 0 (Pantomime if Communication  Barrier) Horizontal EOMs  = 0 Visual Fields = 0 Test Facial Palsy = 0 Left Arm Motor Drift = 0 Right Arm Motor Drift = 0 Left Leg Motor Drift = 0 Right Leg Motor Drift  = 0 Limb Ataxia (F>>N>>F/H>>S) = 0 Sensation =  1 Left leg Language/Aphasia = 0 Dysarthria = 0 Extinction/Inattention = 0   NIHSS score: = 1  Medical Decision Making:  - Extensive number of diagnosis or management options are considered above.   - Extensive amount of complex data reviewed.   - High risk of complication and/or morbidity or mortality are associated with differential diagnostic considerations above.  - There may be Uncertain outcome and increased probability of prolonged functional impairment or high probability of severe prolonged functional impairment associated with some of these differential diagnosis.   Medical Data Reviewed:  1.Data reviewed include clinical labs, radiology,  Medical Tests;   2.Tests results discussed w/performing or interpreting physician;   3.Obtaining/reviewing old medical records;  4.Obtaining case history from another source;  5.Independent review of image, tracing or specimen.    Patient was informed the Neurology Consult would happen via telehealth (remote video) and consented to receiving care in this manner.

## 2017-07-22 NOTE — ED Provider Notes (Signed)
Emergency Department Provider Note   I have reviewed the triage vital signs and the nursing notes.   HISTORY  Chief Complaint Numbness   HPI Bryan [name] is a 61 y.o. male with PMH of COPD, HTN, chronic pain, and recent admission for pneumonia onset left upper extremity and face numbness.  Patient states that he was last well at 9 AM this morning.  He noticed some associated tingling and numbness in his lips.  Patient is also describing bilateral lower extremity burning discomfort. He states that most of his symptoms are in the left arm. He feels some heaviness in this area in addition to numbness. He denies any numbness in the left lower extremity. No prior history of CVA.  Patient also describes some intermittent "dizziness" today that he describes as spinning in quality. No provoking or modifying factors. Denies any slurred speech or vision changes. No difficulty swallowing.    Past Medical History:  Diagnosis Date  . Acid reflux   . Arthritis   . Asthma   . Chronic pain    with leg and back pain (disc problem)  . COPD (chronic obstructive pulmonary disease) ([place])   . History of upper GI x-ray series    to follow showed large duodenal ulcer H pylori serologies were negative  . Hypertension   . Pneumonia   . Tubular adenoma     Patient Active Problem List   Diagnosis Date Noted  . Pneumonia 07/14/2017  . COPD with acute exacerbation ([place]) 07/14/2017  . CAP (community acquired pneumonia) 07/14/2017  . Constipation 07/10/2017  . Hx of adenomatous colonic polyps 05/01/2017  . Acute renal failure (ARF) ([place]) 05/16/2016  . Nausea with vomiting 05/16/2016  . Dehydration 05/16/2016  . Hypertension   . Chronic pain   . Heme positive stool 10/20/2014  . Abdominal pain, epigastric 06/19/2014  . Esophageal dysphagia 06/19/2014  . AP (abdominal pain) 11/13/2013  . Early satiety 11/13/2013  . COPD, severe ([place]) 02/22/2013  . Low back pain 11/19/2012  . COLITIS 05/12/2009  .  ABDOMINAL PAIN, LEFT LOWER QUADRANT 05/12/2009  . DUODENAL ULCER, HX OF 05/12/2009  . ALCOHOL USE 05/11/2009  . GERD 05/11/2009  . Diarrhea 05/11/2009  . SPONDYLOSIS, CERVICAL 07/03/2007  . NECK PAIN 07/03/2007    Past Surgical History:  Procedure Laterality Date  . BACK SURGERY  7/05; 5/09    Dr.Hirsch,3 lumbar  . BACK SURGERY    . COLONOSCOPY  Feb 2012   Dr. Gala Romney: normal rectum, pedunculate polyp removed but not recovered  . ESOPHAGOGASTRODUODENOSCOPY  1/06   Dr. Volney American esophageal erosions,U-shaped stomach,marked erosions and edema of the bulb without discrete ulcer disease.   Marland Kitchen NECK SURGERY  2/09    Current Outpatient Rx  . Order #: [id number] Class: Print  . Order #: [id number] Class: Normal  . Order #: [id number] Class: Normal  . Order #: [id number] Class: Historical Med  . Order #: [id number] Class: Historical Med  . Order #: [id number] Class: Normal  . Order #: [id number] Class: Historical Med  . Order #: [id number] Class: Historical Med  . Order #: [id number] Class: Normal  . Order #: [id number] Class: Historical Med  . Order #: [id number] Class: Historical Med    Allergies Patient has no known allergies.  Family History  Problem Relation Age of Onset  . Cancer Father   . Asthma Mother   . Colon cancer Neg Hx     Social History Social History   Tobacco Use  . Smoking status: Former Smoker    Packs/day: 0.50  Years: 40.00    Pack years: 20.00    Types: Cigarettes    Last attempt to quit: 11/11/2014    Years since quitting: 2.6  . Smokeless tobacco: Never Used  Substance Use Topics  . Alcohol use: No    Alcohol/week: 0.0 oz  . Drug use: Yes    Types: Marijuana    Comment:  'every once in a while"    Review of Systems  Constitutional: No fever/chills Eyes: No visual changes. ENT: No sore throat. Cardiovascular: Denies chest pain. Respiratory: Denies shortness of breath. Gastrointestinal: No abdominal pain.  No nausea, no vomiting.  No diarrhea.  No  constipation. Genitourinary: Negative for dysuria. Musculoskeletal: Negative for back pain. Burning sensation in the bilateral LEs.  Skin: Negative for rash. Neurological: Negative for headaches. Positive LUE weakness and numbness. Positive left face and lip numbness.   10-point ROS otherwise negative.  ____________________________________________   PHYSICAL EXAM:  VITAL SIGNS: ED Triage Vitals  Enc Vitals Group     BP 07/22/17 1247 92/60     Pulse Rate 07/22/17 1247 (!) 105     Resp 07/22/17 1247 18     Temp 07/22/17 1247 98.3 F (36.8 C)     Temp Source 07/22/17 1247 Oral     SpO2 07/22/17 1247 95 %     Weight 07/22/17 1248 113 lb (51.3 kg)     Height 07/22/17 1248 _0  (1.6 m)     Pain Score 07/22/17 1249 10    Constitutional: Alert and oriented. Well appearing and in no acute distress. Eyes: Conjunctivae are normal. PERRL. EOMI. Head: Atraumatic. Nose: No congestion/rhinnorhea. Mouth/Throat: Mucous membranes are moist.  Oropharynx non-erythematous. Neck: No stridor.   Cardiovascular: Normal rate, regular rhythm. Good peripheral circulation. Grossly normal heart sounds.   Respiratory: Normal respiratory effort.  No retractions. Lungs CTAB. Gastrointestinal: Soft and nontender. No distention.  Musculoskeletal: No lower extremity tenderness nor edema. No gross deformities of extremities. Neurologic:  Normal speech and language. No gross focal neurologic deficits are appreciated. No facial asymmetry. Normal CN exam 2-12. Decreased sensation to light touch throughout the LUE compared to right. No pronator drift. Equal grip strength. Normal strength and sensation in the B/L LEs.  Skin:  Skin is warm, dry and intact. No rash noted.  ____________________________________________   LABS (all labs ordered are listed, but only abnormal results are displayed)  Labs Reviewed  CBC - Abnormal; Notable for the following components:      Result Value   WBC 13.5 (*)    All other  components within normal limits  DIFFERENTIAL - Abnormal; Notable for the following components:   Neutro Abs 8.2 (*)    Monocytes Absolute 1.3 (*)    All other components within normal limits  COMPREHENSIVE METABOLIC PANEL - Abnormal; Notable for the following components:   Potassium 3.4 (*)    Chloride 100 (*)    Glucose, Bld 162 (*)    Creatinine, Ser 1.44 (*)    GFR calc non Af Amer 51 (*)    GFR calc Af Amer 59 (*)    All other components within normal limits  I-STAT CHEM 8, ED - Abnormal; Notable for the following components:   Potassium 3.2 (*)    Chloride 98 (*)    Creatinine, Ser 1.50 (*)    Glucose, Bld 166 (*)    Calcium, Ion 1.07 (*)    All other components within normal limits  ETHANOL  PROTIME-INR  APTT  TROPONIN I  RAPID URINE DRUG SCREEN, HOSP PERFORMED  URINALYSIS, ROUTINE W REFLEX MICROSCOPIC  I-STAT TROPONIN, ED   ____________________________________________  EKG   EKG Interpretation  Date/Time:  Saturday July 22 2017 12:57:20 EST Ventricular Rate:  101 PR Interval:    QRS Duration: 76 QT Interval:  319 QTC Calculation: 414 R Axis:   92 Text Interpretation:  Sinus tachycardia Right axis deviation No STEMI.  Similar to prior tracing.  Confirmed by Nanda Quinton [phone]) on 07/22/2017 1:08:09 PM       ____________________________________________  RADIOLOGY  Dg Chest 2 View  Result Date: 07/22/2017 CLINICAL DATA:  Chest pain. EXAM: CHEST  2 VIEW COMPARISON:  July 14, 2017 FINDINGS: The heart, hila, and mediastinum are normal. No pneumothorax. No nodules or masses. No focal infiltrates. No overt edema. IMPRESSION: No active cardiopulmonary disease. Electronically Signed   By: Dorise Bullion III M.D   On: 07/22/2017 13:59   Ct Head Code Stroke Wo Contrast  Result Date: 07/22/2017 CLINICAL DATA:  Code stroke. Focal neurologic deficit less than 6 hours. Suspect stroke EXAM: CT HEAD WITHOUT CONTRAST TECHNIQUE: Contiguous axial images were  obtained from the base of the skull through the vertex without intravenous contrast. COMPARISON:  None. FINDINGS: Brain: No evidence of acute infarction, hemorrhage, hydrocephalus, extra-axial collection or mass lesion/mass effect. Vascular: Negative for hyperdense vessel Skull: Negative Sinuses/Orbits: Negative Other: None ASPECTS ([place] Stroke Program Early CT Score) - Ganglionic level infarction (caudate, lentiform nuclei, internal capsule, insula, M1-M3 cortex): 7 - Supraganglionic infarction (M4-M6 cortex): 3 Total score (0-10 with 10 being normal): 10 IMPRESSION: 1. Negative CT Head 2. ASPECTS is 10 These results were called by telephone at the time of interpretation on 07/22/2017 at 1:13 pm to Dr. Lacinda Axon , who verbally acknowledged these results. Electronically Signed   By: Franchot Gallo M.D.   On: 07/22/2017 13:14    ____________________________________________   PROCEDURES  Procedure(s) performed:   Procedures  CRITICAL CARE Performed by: Margette Fast Total critical care time: 40 minutes Critical care time was exclusive of separately billable procedures and treating other patients. Critical care was necessary to treat or prevent imminent or life-threatening deterioration. Critical care was time spent personally by me on the following activities: development of treatment plan with patient and/or surrogate as well as nursing, discussions with consultants, evaluation of patient's response to treatment, examination of patient, obtaining history from patient or surrogate, ordering and performing treatments and interventions, ordering and review of laboratory studies, ordering and review of radiographic studies, pulse oximetry and re-evaluation of patient's condition.  Nanda Quinton, MD Emergency Medicine  ____________________________________________   INITIAL IMPRESSION / ASSESSMENT AND PLAN / ED COURSE  Pertinent labs & imaging results that were available during my care of the patient  were reviewed by me and considered in my medical decision making (see chart for details).  Patient presents emergency department for evaluation of left arm numbness with some subjective weakness.  Also feeling numbness over the left face.  My exam is positive only for decreased sensation to light touch over the left upper extremity.  No weakness in the area.  No cranial nerve findings.  Patient last known normal at 9 AM.  Patient is within the tPA window and so code stroke was activated.  EKG negative.  CT head negative. Tele Neurology activated.   01:20 PM Reviewed the case with the tele-neurologist, Dr. Jacinto Reap.  The patient has an NIH stroke scale of 1. Not a tPA or LVO intervention candidate but will require  CVA w/u. Possible lacunar CVA. Patient also complaining of chest tightness at this time. Troponin negative and EKG unremarkable. Slight hypotension noted but patient remains awake and alert. Will add on CXR and reassess after IVF bolus.   02:06 PM Labs and imaging reviewed. Patient continues to feel numbness/weakness in the arm but no new symptoms. No MRI available at this facility at this time. Will discuss with the hospitalist regarding admission to [facility] for further CVA w/u.   Discussed patient's case with Hospitalist, Dr. Broadus John to request admission. Patient and family (if present) updated with plan. Care transferred to Hospitalist service.  I reviewed all nursing notes, vitals, pertinent old records, EKGs, labs, imaging (as available).  ____________________________________________  FINAL CLINICAL IMPRESSION(S) / ED DIAGNOSES  Final diagnoses:  Numbness  Precordial chest pain  Cerebrovascular accident (CVA), unspecified mechanism ([place])     MEDICATIONS GIVEN DURING THIS VISIT:  Medications  sodium chloride 0.9 % bolus 500 mL (500 mLs Intravenous New Bag/Given 07/22/17 1324)    Followed by  0.9 %  sodium chloride infusion (not administered)    Note:  This document was  prepared using Dragon voice recognition software and may include unintentional dictation errors.  Nanda Quinton, MD Emergency Medicine   Jordy Hewins, [facility], MD 07/22/17 [phone]

## 2017-07-22 NOTE — ED Notes (Signed)
Dr. Laverta Baltimore at bedside speaking with teleneuro.

## 2017-07-23 ENCOUNTER — Observation Stay (HOSPITAL_BASED_OUTPATIENT_CLINIC_OR_DEPARTMENT_OTHER): Payer: Medicare Other

## 2017-07-23 ENCOUNTER — Encounter (HOSPITAL_COMMUNITY): Payer: Medicare Other

## 2017-07-23 DIAGNOSIS — I5030 Unspecified diastolic (congestive) heart failure: Secondary | ICD-10-CM | POA: Diagnosis not present

## 2017-07-23 DIAGNOSIS — R200 Anesthesia of skin: Secondary | ICD-10-CM

## 2017-07-23 LAB — ECHOCARDIOGRAM COMPLETE
AVLVOTPG: 5 mmHg
CHL CUP DOP CALC LVOT VTI: 16.9 cm
CHL CUP RV SYS PRESS: 37 mmHg
CHL CUP TV REG PEAK VELOCITY: 290 cm/s
E/e' ratio: 4.71
EWDT: 346 ms
FS: 27 % — AB (ref 28–44)
Height: 63 in
IVS/LV PW RATIO, ED: 1.01
LA ID, A-P, ES: 20 mm
LA diam index: 1.31 cm/m2
LA vol A4C: 15.4 ml
LAVOL: 19.8 mL
LAVOLIN: 13 mL/m2
LDCA: 2.01 cm2
LEFT ATRIUM END SYS DIAM: 20 mm
LV E/e' medial: 4.71
LV PW d: 6.43 mm — AB (ref 0.6–1.1)
LV TDI E'LATERAL: 11
LV TDI E'MEDIAL: 8.92
LV e' LATERAL: 11 cm/s
LVEEAVG: 4.71
LVOT SV: 34 mL
LVOTD: 16 mm
LVOTPV: 111 cm/s
MV Dec: 346
MV pk A vel: 60 m/s
MVPKEVEL: 51.8 m/s
RV LATERAL S' VELOCITY: 11.7 cm/s
TAPSE: 14.6 mm
TR max vel: 290 cm/s
WEIGHTICAEL: 1841.28 [oz_av]

## 2017-07-23 LAB — LIPID PANEL
CHOL/HDL RATIO: 3.5 ratio
Cholesterol: 164 mg/dL (ref 0–200)
HDL: 47 mg/dL (ref >40)
LDL CALC: 103 mg/dL — AB (ref 0–99)
Triglycerides: 69 mg/dL (ref <150)
VLDL: 14 mg/dL (ref 0–40)

## 2017-07-23 LAB — BASIC METABOLIC PANEL
ANION GAP: 8 (ref 5–15)
BUN: 16 mg/dL (ref 6–20)
CO2: 25 mmol/L (ref 22–32)
Calcium: 8.6 mg/dL — ABNORMAL LOW (ref 8.9–10.3)
Chloride: 105 mmol/L (ref 101–111)
Creatinine, Ser: 1.23 mg/dL (ref 0.61–1.24)
GFR calc Af Amer: >60 mL/min (ref >60)
Glucose, Bld: 79 mg/dL (ref 65–99)
POTASSIUM: 3.2 mmol/L — AB (ref 3.5–5.1)
SODIUM: 138 mmol/L (ref 135–145)

## 2017-07-23 LAB — GLUCOSE, CAPILLARY: GLUCOSE-CAPILLARY: 92 mg/dL (ref 65–99)

## 2017-07-23 LAB — HEMOGLOBIN A1C
Hgb A1c MFr Bld: 6.5 % — ABNORMAL HIGH (ref 4.8–5.6)
Mean Plasma Glucose: 139.85 mg/dL

## 2017-07-23 MED ORDER — PNEUMOCOCCAL VAC POLYVALENT 25 MCG/0.5ML IJ INJ: 0.5000 mL | INJECTION | INTRAMUSCULAR | Status: DC

## 2017-07-23 MED ORDER — ATORVASTATIN CALCIUM 40 MG PO TABS: 40.0000 mg | ORAL_TABLET | Freq: Every day | ORAL | 11 refills | Status: DC

## 2017-07-23 MED ORDER — INSULIN ASPART 100 UNIT/ML ~~LOC~~ SOLN: 0.0000 [IU] | Freq: Three times a day (TID) | SUBCUTANEOUS | Status: DC

## 2017-07-23 MED ORDER — BLOOD GLUCOSE METER KIT: PACK | 0 refills | Status: DC

## 2017-07-23 MED ORDER — INSULIN ASPART 100 UNIT/ML ~~LOC~~ SOLN
0.0000 [IU] | Freq: Every day | SUBCUTANEOUS | Status: DC
Start: 2017-07-23 — End: 2017-07-23

## 2017-07-23 MED ORDER — LIVING WELL WITH DIABETES BOOK
Freq: Once | Status: AC
  Administered 2017-07-23: 18:00:00
  Filled 2017-07-23: qty 1

## 2017-07-23 MED ORDER — ASPIRIN 325 MG PO TABS: 325.0000 mg | ORAL_TABLET | Freq: Every day | ORAL | 0 refills | Status: DC

## 2017-07-23 MED ORDER — METFORMIN HCL 500 MG PO TABS: 500.0000 mg | ORAL_TABLET | Freq: Every day | ORAL | 0 refills | Status: DC

## 2017-07-23 NOTE — Discharge Instructions (Signed)
Type 2 Diabetes Mellitus, Self Care, Adult Caring for yourself after you have been diagnosed with type 2 diabetes (type 2 diabetes mellitus) means keeping your blood sugar (glucose) under control with a balance of:  Nutrition.  Exercise.  Lifestyle changes.  Medicines or insulin, if necessary.  Support from your team of health care providers and others.  The following information explains what you need to know to manage your diabetes at home. What do I need to do to manage my blood glucose?  Check your blood glucose every day, as often as told by your health care provider.  Contact your health care provider if your blood glucose is above your target for 2 tests in a row.  Have your A1c (hemoglobin A1c) level checked at least two times a year, or as often as told by your health care provider. Your health care provider will set individualized treatment goals for you. Generally, the goal of treatment is to maintain the following blood glucose levels:  Before meals (preprandial): 80-130 mg/dL (4.4-7.2 mmol/L).  After meals (postprandial): below 180 mg/dL (10 mmol/L).  A1c level: less than 7%.  What do I need to know about hyperglycemia and hypoglycemia? What is hyperglycemia? Hyperglycemia, also called high blood glucose, occurs when blood glucose is too high.Make sure you know the early signs of hyperglycemia, such as:  Increased thirst.  Hunger.  Feeling very tired.  Needing to urinate more often than usual.  Blurry vision.  What is hypoglycemia? Hypoglycemia, also called low blood glucose, occurswith a blood glucose level at or below 70 mg/dL (3.9 mmol/L). The risk for hypoglycemia increases during or after exercise, during sleep, during illness, and when skipping meals or not eating for a long time (fasting). It is important to know the symptoms of hypoglycemia and treat it right away. Always have a 15-gram rapid-acting carbohydrate snack with you to treat low blood  glucose. Family members and close friends should also know the symptoms and should understand how to treat hypoglycemia, in case you are not able to treat yourself. What are the symptoms of hypoglycemia? Hypoglycemia symptoms can include:  Hunger.  Anxiety.  Sweating and feeling clammy.  Confusion.  Dizziness or feeling light-headed.  Sleepiness.  Nausea.  Increased heart rate.  Headache.  Blurry vision.  Seizure.  Nightmares.  Tingling or numbness around the mouth, lips, or tongue.  A change in speech.  Decreased ability to concentrate.  A change in coordination.  Restless sleep.  Tremors or shakes.  Fainting.  Irritability.  How do I treat hypoglycemia?  If you are alert and able to swallow safely, follow the 15:15 rule:  Take 15 grams of a rapid-acting carbohydrate. Rapid-acting options include: ? 1 tube of glucose gel. ? 3 glucose pills. ? 6-8 pieces of hard candy. ? 4 oz (120 mL) of fruit juice. ? 4 oz (120 mL) of regular (not diet) soda.  Check your blood glucose 15 minutes after you take the carbohydrate.  If the repeat blood glucose level is still at or below 70 mg/dL (3.9 mmol/L), take 15 grams of a carbohydrate again.  If your blood glucose level does not increase above 70 mg/dL (3.9 mmol/L) after 3 tries, seek emergency medical care.  After your blood glucose level returns to normal, eat a meal or a snack within 1 hour.  How do I treat severe hypoglycemia? Severe hypoglycemia is when your blood glucose level is at or below 54 mg/dL (3 mmol/L). Severe hypoglycemia is an emergency. Do not  wait to see if the symptoms will go away. Get medical help right away. Call your local emergency services (911 in the [place] Do not drive yourself to the hospital. If you have severe hypoglycemia and you cannot eat or drink, you may need an injection of glucagon. A family member or close friend should learn how to check your blood glucose and how to give you  a glucagon injection. Ask your health care provider if you need to have an emergency glucagon injection kit available. Severe hypoglycemia may need to be treated in a hospital. The treatment may include getting glucose through an IV tube. You may also need treatment for the cause of your hypoglycemia. Can having diabetes put me at risk for other conditions? Having diabetes can put you at risk for other long-term (chronic) conditions, such as heart disease and kidney disease. Your health care provider may prescribe medicines to help prevent complications from diabetes. These medicines may include:  Aspirin.  Medicine to lower cholesterol.  Medicine to control blood pressure.  What else can I do to manage my diabetes? Take your diabetes medicines as told  If your health care provider prescribed insulin or diabetes medicines, take them every day.  Do not run out of insulin or other diabetes medicines that you take. Plan ahead so you always have these available.  If you use insulin, adjust your dosage based on how physically active you are and what foods you eat. Your health care provider will tell you how to adjust your dosage. Make healthy food choices  The things that you eat and drink affect your blood glucose and your insulin dosage. Making good choices helps to control your diabetes and prevent other health problems. A healthy meal plan includes eating lean proteins, complex carbohydrates, fresh fruits and vegetables, low-fat dairy products, and healthy fats. Make an appointment to see a diet and nutrition specialist (registered dietitian) to help you create an eating plan that is right for you. Make sure that you:  Follow instructions from your health care provider about eating or drinking restrictions.  Drink enough fluid to keep your urine clear or pale yellow.  Eat healthy snacks between nutritious meals.  Track the carbohydrates that you eat. Do this by reading food labels and  learning the standard serving sizes of foods.  Follow your sick day plan whenever you cannot eat or drink as usual. Make this plan in advance with your health care provider.  Stay active  Exercise regularly, as told by your health care provider. This may include:  Stretching and doing strength exercises, such as yoga or weightlifting, at least 2 times a week.  Doing at least 150 minutes of moderate-intensity or vigorous-intensity exercise each week. This could be brisk walking, biking, or water aerobics. ? Spread out your activity over at least 3 days of the week. ? Do not go more than 2 days in a row without doing some kind of physical activity.  When you start a new exercise or activity, work with your health care provider to adjust your insulin, medicines, or food intake as needed. Make healthy lifestyle choices  Do not use any tobacco products, such as cigarettes, chewing tobacco, and e-cigarettes. If you need help quitting, ask your health care provider.  If your health care provider says that alcohol is safe for you, limit alcohol intake to no more than 1 drink per day for nonpregnant women and 2 drinks per day for men. One drink equals 12 oz of  beer, 5 oz of wine, or 1 oz of hard liquor.  Learn to manage stress. If you need help with this, ask your health care provider. Care for your body   Keep your immunizations up to date. In addition to getting vaccinations as told by your health care provider, it is recommended that you get vaccinated against the following illnesses: ? The flu (influenza). Get a flu shot every year. ? Pneumonia. ? Hepatitis B.  Schedule an eye exam soon after your diagnosis, and then one time every year after that.  Check your skin and feet every day for cuts, bruises, redness, blisters, or sores. Schedule a foot exam with your health care provider once every year.  Brush your teeth and gums two times a day, and floss at least one time a day. Visit your  dentist at least once every 6 months.  Maintain a healthy weight. General instructions  Take over-the-counter and prescription medicines only as told by your health care provider.  Share your diabetes management plan with people in your workplace, school, and household.  Check your urine for ketones when you are ill and as told by your health care provider.  Ask your health care provider: ? Do I need to meet with a diabetes educator? ? Where can I find a support group for people with diabetes?  Carry a medical alert card or wear medical alert jewelry.  Keep all follow-up visits as told by your health care provider. This is important. Where to find more information: For more information about diabetes, visit:  American Diabetes Association (ADA): www.diabetes.org  American Association of Diabetes Educators (AADE): www.diabeteseducator.org/patient-resources  This information is not intended to replace advice given to you by your health care provider. Make sure you discuss any questions you have with your health care provider. Document Released: 12/21/2015 Document Revised: 02/04/2016 Document Reviewed: 10/02/2015 Elsevier Interactive Patient Education  2017 [employer].

## 2017-07-23 NOTE — Progress Notes (Signed)
*  PRELIMINARY RESULTS* Vascular Ultrasound Carotid Duplex (Doppler) has been completed.  Preliminary findings: Bilateral 1-39% ICA stenosis, antegrade vertebral flow.   Everrett Coombe 07/23/2017, 4:09 PM

## 2017-07-23 NOTE — Progress Notes (Signed)
  Echocardiogram 2D Echocardiogram has been performed.  Bryan [name] 07/23/2017, 11:19 AM

## 2017-07-23 NOTE — Evaluation (Signed)
Physical Therapy Evaluation Patient Details Name: Bryan [name] MRN: [id number] DOB: [birth year] Today's Date: 07/23/2017   History of Present Illness  61 y.o.malewith history of COPD, HTN, GERD was just discharged from [facility] last week after hospitalization for COPD exacerbation. Presenting to [facility] ED on 11/10 with L-sided weakness and numbness. 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  Clinical Impression  Pt presents with the above diagnosis and below deficits for therapy evaluation. Prior to admission, pt was living with his family in a single level home. Pt was completely independent and doing for himself using a cane occasionally. Pt has no history of falls. Pt is able to perform bed mobs, transfers and gait with supervision bordering on Mod I this session. Pt is expected to return to PLOF without the need of continued skilled PT services. PT will sign off for now. Please re-order if any new needs arise.     Follow Up Recommendations No PT follow up    Equipment Recommendations  None recommended by PT    Recommendations for Other Services       Precautions / Restrictions Precautions Precautions: Fall Restrictions Weight Bearing Restrictions: No      Mobility  Bed Mobility Overal bed mobility: Needs Assistance Bed Mobility: Supine to Sit     Supine to sit: Supervision;HOB elevated     General bed mobility comments: supervision for safety and HOB elevated  Transfers Overall transfer level: Needs assistance Equipment used: None Transfers: Sit to/from Stand Sit to Stand: Supervision         General transfer comment: Supervision for safety, no LOB  Ambulation/Gait Ambulation/Gait assistance: Supervision;Modified independent (Device/Increase time) Ambulation Distance (Feet): 250 Feet Assistive device: None Gait Pattern/deviations: Step-through pattern;Decreased step length - right;Decreased step length - left Gait  velocity: decreased Gait velocity interpretation: Below normal speed for age/gender General Gait Details: slow, steady gait with minimal trunk sway. No LOB or diffiuclty with turning or changing directions.   Stairs            Wheelchair Mobility    Modified Rankin (Stroke Patients Only)       Balance Overall balance assessment: Needs assistance Sitting-balance support: No upper extremity supported Sitting balance-Leahy Scale: Good     Standing balance support: No upper extremity supported;During functional activity Standing balance-Leahy Scale: Fair Standing balance comment: No UE support needed.                              Pertinent Vitals/Pain Pain Assessment: No/denies pain    Home Living Family/patient expects to be discharged to:: Private residence Living Arrangements: Children Available Help at Discharge: Family;Available 24 hours/day Type of Home: House Home Access: Level entry     Home Layout: One level Home Equipment: Cane - single point Additional Comments: Pt reporting that his son is off work till dec 3 and can be present at [employer]    Prior Function Level of Independence: Independent         Comments: ADLs, IADLs, driving     Hand Dominance   Dominant Hand: Right    Extremity/Trunk Assessment   Upper Extremity Assessment Upper Extremity Assessment: Overall WFL for tasks assessed;LUE deficits/detail LUE Deficits / Details: Upon admission, pt reporting L side weakness and numbness. Pt demonstrating equal strength in BUEs and reports all tingling and numbness is gone in UEs. Pt slow with coordination (bilaterally) and finger opposition    Lower Extremity  Assessment Lower Extremity Assessment: Overall WFL for tasks assessed    Cervical / Trunk Assessment Cervical / Trunk Assessment: Normal  Communication   Communication: No difficulties  Cognition Arousal/Alertness: Awake/alert Behavior During Therapy: WFL for tasks  assessed/performed Overall Cognitive Status: Impaired/Different from baseline Area of Impairment: Problem solving                             Problem Solving: Slow processing;Requires verbal cues General Comments: Pt with slower processing and requires increased time for cognitive tasks and problem solving. Feel this may be close to baseline cognition.      General Comments General comments (skin integrity, edema, etc.): Brother present during session. Provided education on BEFAST stroke signs and symptoms    Exercises     Assessment/Plan    PT Assessment Patent does not need any further PT services  PT Problem List         PT Treatment Interventions      PT Goals (Current goals can be found in the Care Plan section)  Acute Rehab PT Goals Patient Stated Goal: "Get more breakfast" PT Goal Formulation: All assessment and education complete, DC therapy    Frequency     Barriers to discharge        Co-evaluation               AM-PAC PT "6 Clicks" Daily Activity  Outcome Measure Difficulty turning over in bed (including adjusting bedclothes, sheets and blankets)?: None Difficulty moving from lying on back to sitting on the side of the bed? : None Difficulty sitting down on and standing up from a chair with arms (e.g., wheelchair, bedside commode, etc,.)?: None Help needed moving to and from a bed to chair (including a wheelchair)?: A Little Help needed walking in hospital room?: A Little Help needed climbing 3-5 steps with a railing? : A Little 6 Click Score: 21    End of Session Equipment Utilized During Treatment: Gait belt Activity Tolerance: Patient tolerated treatment well Patient left: in bed;with call bell/phone within reach;with family/visitor present Nurse Communication: Mobility status PT Visit Diagnosis: Muscle weakness (generalized) (M62.81);Unsteadiness on feet (R26.81)    Time: 6244-6950 PT Time Calculation (min) (ACUTE ONLY): 9  min   Charges:   PT Evaluation $PT Eval Low Complexity: 1 Low     PT G Codes:   PT G-Codes **NOT FOR INPATIENT CLASS** Functional Assessment Tool Used: AM-PAC 6 Clicks Basic Mobility;Clinical judgement Functional Limitation: Mobility: Walking and moving around Mobility: Walking and Moving Around Current Status (H2257): At least 20 percent but less than 40 percent impaired, limited or restricted Mobility: Walking and Moving Around Goal Status [phone]): At least 20 percent but less than 40 percent impaired, limited or restricted Mobility: Walking and Moving Around Discharge Status [id number]): At least 20 percent but less than 40 percent impaired, limited or restricted    Scheryl Marten PT, DPT     Jacqulyn Liner Sloan Leiter 07/23/2017, 9:20 AM

## 2017-07-23 NOTE — Evaluation (Signed)
Occupational Therapy Evaluation Patient Details Name: Bryan [name] MRN: [id number] DOB: [birth year] Today's Date: 07/23/2017    History of Present Illness 61 y.o.malewith history of COPD, HTN, GERD was just discharged from [facility] last week after hospitalization for COPD exacerbation. Presenting to [facility] ED on 11/10 with L-sided weakness and numbness. 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   Clinical Impression   PTA, pt was living alone and was independent. Pt currently performing ADLs and functional mobility at supervision-Min Guard level. Pt reporting that numbness in LUE and lips is gone; presenting with equal strength in BUEs. Pt demonstrating decreased processing and balance requiring increased time; feel this may be near pt baseline. Pt would benefit from further acute OT to facilitate safe dc and address cognition and IADLs. Recommend dc home once medically stable per physician.     Follow Up Recommendations  No OT follow up;Supervision/Assistance - 24 hour    Equipment Recommendations  None recommended by OT    Recommendations for Other Services       Precautions / Restrictions Precautions Precautions: Fall Restrictions Weight Bearing Restrictions: No      Mobility Bed Mobility Overal bed mobility: Needs Assistance Bed Mobility: Supine to Sit     Supine to sit: Supervision;HOB elevated     General bed mobility comments: supervision for safety and HOB elevated  Transfers Overall transfer level: Needs assistance Equipment used: None Transfers: Sit to/from Stand Sit to Stand: Supervision         General transfer comment: supervision for safety    Balance Overall balance assessment: Needs assistance Sitting-balance support: No upper extremity supported;Feet supported Sitting balance-Leahy Scale: Good     Standing balance support: No upper extremity supported;During functional activity Standing balance-Leahy  Scale: Fair Standing balance comment: No UE support needed.                            ADL either performed or assessed with clinical judgement   ADL Overall ADL's : Needs assistance/impaired Eating/Feeding: Set up;Sitting Eating/Feeding Details (indicate cue type and reason): Pt demonstrating [facility] FM skills for meal preparations and preparing coffee. Able to open sugar packets and lids without difficulty.  Pt using BUEs and demonstrating bilateral coordination Grooming: Min guard;Standing   Upper Body Bathing: Set up;Sitting   Lower Body Bathing: Min guard;Sit to/from stand   Upper Body Dressing : Set up;Sitting   Lower Body Dressing: Min guard;Sit to/from stand Lower Body Dressing Details (indicate cue type and reason): Pt able to adjust socks without difficulty. Toilet Transfer: Min guard;Ambulation       Tub/ Shower Transfer: Tub transfer;Min guard;Ambulation Tub/Shower Transfer Details (indicate cue type and reason): Demonstrating WFL balance to perform tub transfer with Min Guard for safety Functional mobility during ADLs: Min guard General ADL Comments: Pt performing ADLs and fucnitonal mobility at supervision to [employer] level. Demonstrating slower processing and decreased balance during mobility. Pt requiring increased time throughout session     Vision Baseline Vision/History: Wears glasses Wears Glasses: Reading only Patient Visual Report: No change from baseline       Perception     Praxis      Pertinent Vitals/Pain Pain Assessment: No/denies pain     Hand Dominance Right   Extremity/Trunk Assessment Upper Extremity Assessment Upper Extremity Assessment: Overall WFL for tasks assessed;LUE deficits/detail LUE Deficits / Details: Upon admission, pt reporting L side weakness and numbness. Pt demonstrating equal strength in  BUEs and reports all tingling and numbness is gone in UEs. Pt slow with coordination (bilaterally) and finger opposition    Lower Extremity Assessment Lower Extremity Assessment: Defer to PT evaluation   Cervical / Trunk Assessment Cervical / Trunk Assessment: Normal   Communication Communication Communication: No difficulties   Cognition Arousal/Alertness: Awake/alert Behavior During Therapy: WFL for tasks assessed/performed Overall Cognitive Status: Impaired/Different from baseline Area of Impairment: Problem solving                             Problem Solving: Slow processing;Requires verbal cues General Comments: Pt with slower processing and requires increased time for cognitive tasks and problem solving. Feel this may be close to baseline cognition.   General Comments  Brother present during session. Provided education on BEFAST stroke signs and symptoms    Exercises     Shoulder Instructions      Home Living Family/patient expects to be discharged to:: Private residence Living Arrangements: Children Available Help at Discharge: Family;Available 24 hours/day Type of Home: House Home Access: Level entry     Home Layout: One level     Bathroom Shower/Tub: Tub/shower unit;Curtain   Biochemist, clinical: Standard     Home Equipment: Cane - single point   Additional Comments: Pt reporting that his son is off work till dec 3 and can be present at [employer]      Prior Functioning/Environment Level of Independence: Independent        Comments: ADLs, IADLs, driving        OT Problem List: Decreased activity tolerance;Impaired balance (sitting and/or standing);Decreased cognition;Decreased safety awareness;Pain      OT Treatment/Interventions: Self-care/ADL training;Therapeutic exercise;DME and/or AE instruction;Energy conservation;Therapeutic activities;Patient/family education    OT Goals(Current goals can be found in the care plan section) Acute Rehab OT Goals Patient Stated Goal: "Get more breakfast" OT Goal Formulation: With patient Time For Goal Achievement:  08/06/17 Potential to Achieve Goals: Good ADL Goals Additional ADL Goal #1: Pt will perform ADLs at Mod I level Additional ADL Goal #2: Pt will recall BEFAST with Min VCs Additional ADL Goal #3: Pt will perform three step trail making task with 2-3 VCs  OT Frequency: Min 2X/week   Barriers to D/C:            Co-evaluation              AM-PAC PT "6 Clicks" Daily Activity     Outcome Measure Help from another person eating meals?: None Help from another person taking care of personal grooming?: A Little Help from another person toileting, which includes using toliet, bedpan, or urinal?: A Little Help from another person bathing (including washing, rinsing, drying)?: A Little Help from another person to put on and taking off regular upper body clothing?: None Help from another person to put on and taking off regular lower body clothing?: A Little 6 Click Score: 20   End of Session Equipment Utilized During Treatment: Gait belt Nurse Communication: Mobility status  Activity Tolerance: Patient tolerated treatment well Patient left: in bed;with call bell/phone within reach;with bed alarm set(at EOB drinking coffee)  OT Visit Diagnosis: Unsteadiness on feet (R26.81);Pain;Other symptoms and signs involving cognitive function Pain - Right/Left: (Bils) Pain - part of body: Leg                Time: 2505-3976 OT Time Calculation (min): 20 min Charges:  OT General Charges $OT Visit: 1 Visit OT Evaluation $  OT Eval Moderate Complexity: 1 Mod G-Codes: OT G-codes **NOT FOR INPATIENT CLASS** Functional Assessment Tool Used: Clinical judgement Functional Limitation: Self care Self Care Current Status (G0920): At least 1 percent but less than 20 percent impaired, limited or restricted Self Care Goal Status (Y4159): 0 percent impaired, limited or restricted   [place], OTR/L Acute Rehab Pager: [phone] Office: [place] 07/23/2017, 9:18 AM

## 2017-07-23 NOTE — Progress Notes (Addendum)
Patient ready for discharge to home; discharge instructions given and reviewed; Rx's sent electronically. Patient verbalized understanding of instructions; patient did a self check of his CBG via  finger stick; preformed the task with good techique. Instructed to obtain his own meter from his pharmacy with the Rx provided.  Living Well with diabetes book given and reviewed; discharge medications reviewed. Patient discharged with stroke education book as well and reviewed. Importance of follow up appointment with his PCP reviewed and repeated for within 1 week f/u. Patient dressed and ready; discharged out via wheelchair accompanied by his family member.

## 2017-07-23 NOTE — Consult Note (Signed)
Requesting Physician: Dr. Posey Pronto    Chief Complaint: Left side weakness  History obtained from:    Patient and Chart   HPI:                                                                                                                                       Bryan [name] is an 61 y.o. male with PMH of HTN, COPD, previous tobacco abuse [facility] hospital after experiencing sudden onset left-sided numbness and weakness at 9 AM last morning. He was seen by telemetry stroke neurology and had recommended admission to [facility] for further stroke workup. Patient states his symptoms have resolved by this morning and he feels back to his baseline. Not had any strokelike symptoms in the past. He does not take aspirin home. He quit smoking 3 years   Date last known well: 11.9.18 Time last known well: 9am tPA Given: no, outside window    Past Medical History:  Diagnosis Date  . Acid reflux   . Arthritis   . Asthma   . Chronic pain    with leg and back pain (disc problem)  . COPD (chronic obstructive pulmonary disease) ([place])   . History of upper GI x-ray series    to follow showed large duodenal ulcer H pylori serologies were negative  . Hypertension   . Pneumonia   . Tubular adenoma     Past Surgical History:  Procedure Laterality Date  . BACK SURGERY  7/05; 5/09    Dr.Hirsch,3 lumbar  . BACK SURGERY    . COLONOSCOPY  Feb 2012   Dr. Gala Romney: normal rectum, pedunculate polyp removed but not recovered  . ESOPHAGOGASTRODUODENOSCOPY  1/06   Dr. Volney American esophageal erosions,U-shaped stomach,marked erosions and edema of the bulb without discrete ulcer disease.   Marland Kitchen NECK SURGERY  2/09    Family History  Problem Relation Age of Onset  . Cancer Father   . Asthma Mother   . Colon cancer Neg Hx    Social History:  reports that he quit smoking about 2 years ago. His smoking use included cigarettes. He has a 20.00 pack-year smoking history. he has never used smokeless tobacco. He reports  that he uses drugs. Drug: Marijuana. He reports that he does not drink alcohol.  Allergies: No Known Allergies  Medications:  I reviewed home medications   ROS:                                                                                                                                     14 systems reviewed and negative except above    Examination:                                                                                                      General: Appears well-developed and well-nourished.  Psych: Affect appropriate to situation Eyes: No scleral injection HENT: No OP obstrucion Head: Normocephalic.  Cardiovascular: Normal rate and regular rhythm.  Respiratory: Effort normal and breath sounds normal to anterior ascultation GI: Soft.  No distension. There is no tenderness.  Skin: WDI   Neurological Examination Mental Status: Alert, oriented, thought content appropriate.  Speech fluent without evidence of aphasia.  Able to follow 3 step commands without difficulty. Cranial Nerves: II: Visual fields grossly normal,  III,IV, VI: ptosis not present, extra-ocular motions intact bilaterally, pupils equal, round, reactive to light and accommodation V,VII: smile symmetric, facial light touch sensation normal bilaterally VIII: hearing normal bilaterally IX,X: uvula rises symmetrically XI: bilateral shoulder shrug XII: midline tongue extension Motor: Right : Upper extremity   5/5    Left:     Upper extremity   5/5  Lower extremity   5/5     Lower extremity   5/5 Tone and bulk:normal tone throughout; no atrophy noted Sensory: Pinprick and light touch intact throughout, bilaterally Deep Tendon Reflexes: 2+ and symmetric throughout Plantars: Right: downgoing   Left: downgoing Cerebellar: normal finger-to-nose, normal rapid alternating movements and normal  heel-to-shin test Gait: normal gait and station     Lab Results: Basic Metabolic Panel: Recent Labs  Lab 07/22/17 1300 07/22/17 1304 07/22/17 2221 07/23/17 0403  NA 136 137  --  138  K 3.4* 3.2*  --  3.2*  CL 100* 98*  --  105  CO2 26  --   --  25  GLUCOSE 162* 166*  --  79  BUN 20 19  --  16  CREATININE 1.44* 1.50* 1.39* 1.23  CALCIUM 9.3  --   --  8.6*    CBC: Recent Labs  Lab 07/22/17 1300 07/22/17 1304 07/22/17 2221  WBC 13.5*  --  11.8*  NEUTROABS 8.2*  --   --   HGB 15.8 16.7 13.5  HCT 44.7 49.0 39.0  MCV 88.0  --  87.4  PLT 350  --  284    Coagulation Studies: Recent Labs    07/22/17 1300  LABPROT 12.7  INR 0.96    Imaging: Dg Chest 2 View  Result Date: 07/22/2017 CLINICAL DATA:  Chest pain. EXAM: CHEST  2 VIEW COMPARISON:  July 14, 2017 FINDINGS: The heart, hila, and mediastinum are normal. No pneumothorax. No nodules or masses. No focal infiltrates. No overt edema. IMPRESSION: No active cardiopulmonary disease. 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 Electronically Signed   By: Elon Alas M.D.   On: 07/23/2017 01:40   Result Date: 07/23/2017 CLINICAL DATA:  LEFT arm and facial numbness. Lower extremity tingling sensation. Assess for stroke. History of hypertension, severe COPD, ACDF. EXAM: MRI HEAD WITHOUT CONTRAST MRA HEAD WITHOUT CONTRAST TECHNIQUE: Multiplanar, multiecho pulse sequences of the brain and surrounding structures were obtained without intravenous contrast. Angiographic images of the head were obtained using MRA technique without contrast. COMPARISON:  CT HEAD July 22, 2017 at 1258 hours an MRI of the cervical spine Feb 03, 2013 FINDINGS: MRI HEAD FINDINGS- moderately motion degraded axial SWAN sequence, some sequences are mildly  motion degraded. BRAIN: No reduced diffusion to suggest acute ischemia. No susceptibility artifact to suggest hemorrhage though motion limits sensitivity for small hemorrhage. The ventricles and sulci are normal for patient's age. Old small LEFT cerebellar infarcts. Patchy supratentorial and pontine white matter FLAIR T2 hyperintensities. No suspicious parenchymal signal, mass or mass effect. No abnormal extra-axial fluid collections. VASCULAR: Normal major intracranial vascular flow voids present at skull base. SKULL AND UPPER CERVICAL SPINE: No abnormal sellar expansion. No suspicious calvarial bone marrow signal. Craniocervical junction maintained. Status post ACDF. SINUSES/ORBITS: The mastoid air-cells and included paranasal sinuses are well-aerated. The included ocular globes and orbital contents are non-suspicious. OTHER: None. MRA HEAD FINDINGS ANTERIOR CIRCULATION: Normal flow related enhancement of the included cervical, petrous, cavernous and supraclinoid internal carotid arteries. 2 mm inferiorly directed aneurysm LEFT cavernous internal carotid artery. Patent anterior communicating artery. Patent anterior and middle cerebral arteries, including distal segments. No large vessel occlusion, flow limiting stenosis. POSTERIOR CIRCULATION: No flow related enhancement LEFT V4 segment proximally, retrograde flow reconstituted LEFT V4 distally. Basilar artery is patent, with normal flow related enhancement of the main branch vessels. Tiny RIGHT P1 segment, with mid segment loss of flow related enhancement. Potential fetal origin RIGHT posterior cerebral artery. Small LEFT posterior communicating artery present. No large vessel occlusion, flow limiting stenosis,  aneurysm. ANATOMIC VARIANTS: None. Source images and MIP images were reviewed. IMPRESSION: MRI HEAD: 1. No acute intracranial process on this motion degraded examination 2. Mild chronic small vessel ischemic disease. 3. Multiple small LEFT cerebellar  infarcts. MRA HEAD: 1. No emergent large vessel occlusion or flow limiting stenosis. 2. Chronically occluded LEFT vertebral artery with distal LEFT V4 reconstitution. 3. Slow flow versus occluded RIGHT P1 segment with robust RIGHT posterior communicating artery constituting RIGHT PCA. Electronically Signed: By: Elon Alas M.D. 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 Electronically Signed   By: Elon Alas M.D.   On: 07/23/2017 01:40   Result Date: 07/23/2017 CLINICAL DATA:  LEFT arm and facial numbness. Lower extremity tingling sensation. Assess for stroke. History of hypertension,  severe COPD, ACDF. EXAM: MRI HEAD WITHOUT CONTRAST MRA HEAD WITHOUT CONTRAST TECHNIQUE: Multiplanar, multiecho pulse sequences of the brain and surrounding structures were obtained without intravenous contrast. Angiographic images of the head were obtained using MRA technique without contrast. COMPARISON:  CT HEAD July 22, 2017 at 1258 hours an MRI of the cervical spine Feb 03, 2013 FINDINGS: MRI HEAD FINDINGS- moderately motion degraded axial SWAN sequence, some sequences are mildly motion degraded. BRAIN: No reduced diffusion to suggest acute ischemia. No susceptibility artifact to suggest hemorrhage though motion limits sensitivity for small hemorrhage. The ventricles and sulci are normal for patient's age. Old small LEFT cerebellar infarcts. Patchy supratentorial and pontine white matter FLAIR T2 hyperintensities. No suspicious parenchymal signal, mass or mass effect. No abnormal extra-axial fluid collections. VASCULAR: Normal major intracranial vascular flow voids present at skull base. SKULL AND UPPER CERVICAL SPINE: No abnormal sellar expansion. No suspicious calvarial bone marrow signal. Craniocervical junction maintained. Status post ACDF. SINUSES/ORBITS: The  mastoid air-cells and included paranasal sinuses are well-aerated. The included ocular globes and orbital contents are non-suspicious. OTHER: None. MRA HEAD FINDINGS ANTERIOR CIRCULATION: Normal flow related enhancement of the included cervical, petrous, cavernous and supraclinoid internal carotid arteries. 2 mm inferiorly directed aneurysm LEFT cavernous internal carotid artery. Patent anterior communicating artery. Patent anterior and middle cerebral arteries, including distal segments. No large vessel occlusion, flow limiting stenosis. POSTERIOR CIRCULATION: No flow related enhancement LEFT V4 segment proximally, retrograde flow reconstituted LEFT V4 distally. Basilar artery is patent, with normal flow related enhancement of the main branch vessels. Tiny RIGHT P1 segment, with mid segment loss of flow related enhancement. Potential fetal origin RIGHT posterior cerebral artery. Small LEFT posterior communicating artery present. No large vessel occlusion, flow limiting stenosis,  aneurysm. ANATOMIC VARIANTS: None. Source images and MIP images were reviewed. IMPRESSION: MRI HEAD: 1. No acute intracranial process on this motion degraded examination 2. Mild chronic small vessel ischemic disease. 3. Multiple small LEFT cerebellar infarcts. MRA HEAD: 1. No emergent large vessel occlusion or flow limiting stenosis. 2. Chronically occluded LEFT vertebral artery with distal LEFT V4 reconstitution. 3. Slow flow versus occluded RIGHT P1 segment with robust RIGHT posterior communicating artery constituting RIGHT PCA. Electronically Signed: By: Elon Alas M.D. On: 07/23/2017 00:04   Ct Head Code Stroke Wo Contrast  Result Date: 07/22/2017 CLINICAL DATA:  Code stroke. Focal neurologic deficit less than 6 hours. Suspect stroke EXAM: CT HEAD WITHOUT CONTRAST TECHNIQUE: Contiguous axial images were obtained from the base of the skull through the vertex without intravenous contrast. COMPARISON:  None. FINDINGS: Brain:  No evidence of acute infarction, hemorrhage, hydrocephalus, extra-axial collection or mass lesion/mass effect. Vascular: Negative for hyperdense vessel Skull: Negative Sinuses/Orbits: Negative Other: None ASPECTS ([place] Stroke Program Early CT Score) - Ganglionic level infarction (caudate, lentiform nuclei, internal capsule, insula, M1-M3 cortex): 7 - Supraganglionic infarction (M4-M6 cortex): 3 Total score (0-10 with 10 being normal): 10 IMPRESSION: 1. Negative CT Head 2. ASPECTS is 10 These results were called by telephone at the time of interpretation on 07/22/2017 at 1:13 pm to Dr. Lacinda Axon , who verbally acknowledged these results. Electronically Signed   By: Franchot Gallo M.D.   On: 07/22/2017 13:14     ASSESSMENT AND PLAN   61 year old male with history of COPD, hypertension, prior tobacco use presents with left-sided numbness and weakness that has resolved. MRI head is negative for an acute stroke however show some small punctate infarcts in the left cerebellum. Diagnoses likely TIA. MRA was performed and  showed chronically occluded left vertebral artery which is incidental. Remaining stroke workup revealed patient has borderline diabetes, hyperlipidemia.  Transient ischemic Attack  Workup included # MRI of the brain without contrast: no acute infarction,  #MRA Head : Left vertebral occlusion #Transthoracic Echo: Ejection fraction 60-65%, normal left atrial size, no clot, no obvious PFO( bubble study not performed)  # HBAIC : 6.5 # Lipid profile: LDL 103 # Telemetry monitoring: no Atrial fibrillation # Frequent neuro checks: stable   Plan:  Aspirin 325 mg daily Atorvastatin 40 mg daily Blood pressure goal less than 140/90 Evaluation by PCP for diabetes Heart healthy diet and exercise Continued tobacco abstinence  Stroke follow-up with Dr.Sethi or Dr. Cornelius Moras at [facility],    [place] Pager Number [phone]

## 2017-07-25 NOTE — Discharge Summary (Signed)
[facility] Discharge Summary   Patient: Bryan [name] ZOX:[id number]   PCP: Rosita Fire, MD DOB: July 05, 1956   Date of admission: 07/22/2017   Date of discharge: 07/23/2017     Discharge Diagnoses:  Principal Problem:   Left sided numbness Active Problems:   DUODENAL ULCER, HX OF   COPD, severe (HCC)   Hypertension   TIA (transient ischemic attack)   Admitted From: home Disposition:  home  Recommendations for Outpatient Follow-up:  1. Please establish care with PCP and follow-up in 1 week with blood glucose log  Follow-up Information    Rosita Fire, MD. Schedule an appointment as soon as possible for a visit in 1 week(s).   Specialty:  Internal Medicine Contact information: [place] [address] [phone]          Diet recommendation: Cardiac diet carb modified  Activity: The patient is advised to gradually reintroduce usual activities.  Discharge Condition: good  Code Status: full code  History of present illness: As per the H and P dictated on admission, "Bryan [name] is a 61 y.o. male  with history of COPD, hypertension, GERD was just discharged from [facility] last week after hospitalization for COPD exacerbation.  Patient reports being in his usual state of health, but felt a little different this morning and then around 9am suddenly experienced left-sided numbness and weakness, since this persisted he presented to the emergency room for evaluation Patient also reports that he has long-standing burning and tingling numbness in his hands and feet however that is different from what he experienced today involving his left arm and left leg. In addition patient also reports some weakness/ heaviness on his left side.    "  Hospital Course:  Summary of his active problems in the hospital is as following. Left sided numbness TIA -ASA 81 mg CT head negative, MRI MRA brain negative for any acute stroke. Echocardiogram  unremarkable, preserved EF. Carotid Doppler minimal stenosis no acute abnormality. MRA shows possible chronic stenosis but no indication for acute workup per neurology. Appreciate neurology input and assistance. Hemoglobin A1c elevated recommend treating with metformin as an outpatient with diet and exercise control.    Transient chest pain - resolved,  EKG without acute changes and troponin 1 negative Troponins are negative, less likely cardiac chest pain. Echocardiogram shows no wall motion abnormalities preserved EF. No further workup indicated right now.    Chronic tingling numbess of both feet -denies ETOH abuse B12 normal. Symptoms resolved.     Hyperglycemia Hemoglobin A1c elevated in the diabetes range. Patient was educated on blood glucose checking. Patient will be discharged on 500 mg metformin and does can be increased further once patient follows up with PCP. A blood glucose meter prescription was provided to the patient. Patient verbalized understanding about all education.    COPD - stable, nebs when necessary  Hyperlipidemia. Elevated total cholesterol as well as LDL levels. Presenting with TIA like symptoms. Recommend to start patient on Lipitor 40 mg daily. Recheck in 1 month with PCP.  All other chronic medical condition were stable during the hospitalization.  Patient was seen by physical therapy, who recommended no PT follow up needed . On the day of the discharge the patient's vitals were stable, and no other acute medical condition were reported by patient. the patient was felt safe to be discharge at home with family.  Procedures and Results:  Echocardiogram   Carotid dopplers   Consultations:  Neurology  DISCHARGE MEDICATION: Discharge Medication List as of 07/23/2017  5:20 PM    START taking these medications   Details  aspirin 325 MG tablet Take 1 tablet (325 mg total) daily by mouth., Starting Mon 07/24/2017, Normal    atorvastatin  (LIPITOR) 40 MG tablet Take 1 tablet (40 mg total) daily by mouth., Starting Sun 07/23/2017, Until Mon 07/23/2018, Normal    blood glucose meter kit and supplies Dispense based on patient and insurance preference. Use up to four times daily as directed. (FOR ICD-9 250.00, 250.01)., Print    metFORMIN (GLUCOPHAGE) 500 MG tablet Take 1 tablet (500 mg total) daily with breakfast by mouth., Starting Sun 07/23/2017, Until Mon 07/23/2018, Normal      CONTINUE these medications which have NOT CHANGED   Details  albuterol (PROVENTIL HFA;VENTOLIN HFA) 108 (90 Base) MCG/ACT inhaler Inhale 1-2 puffs every 4 (four) hours as needed into the lungs for wheezing or shortness of breath. Do not use with nebulizer, Starting Mon 07/17/2017, Print    albuterol (PROVENTIL) (2.5 MG/3ML) 0.083% nebulizer solution Take 3 mLs (2.5 mg total) every 4 (four) hours as needed by nebulization for wheezing or shortness of breath., Starting Mon 07/17/2017, Normal    AMITIZA 24 MCG capsule Take 1 capsule 2 (two) times daily by mouth., Starting Tue 07/11/2017, Historical Med    benzonatate (TESSALON) 100 MG capsule Take 1 capsule (100 mg total) 3 (three) times daily as needed by mouth for cough., Starting Mon 07/17/2017, Normal    gabapentin (NEURONTIN) 300 MG capsule Take 300 mg by mouth 2 (two) times daily., Historical Med    HYDROcodone-acetaminophen (NORCO) 10-325 MG tablet Take 1 tablet by mouth 3 (three) times daily as needed., Starting Thu 06/08/2017, Historical Med    ipratropium-albuterol (DUONEB) 0.5-2.5 (3) MG/3ML SOLN Take 3 mLs every 6 (six) hours as needed by nebulization (wheezing)., Starting Mon 07/17/2017, Normal    lisinopril-hydrochlorothiazide (PRINZIDE,ZESTORETIC) 10-12.5 MG tablet Take 1 tablet by mouth daily., Historical Med    meloxicam (MOBIC) 7.5 MG tablet Take 7.5 mg by mouth daily as needed for pain. , Historical Med    pantoprazole (PROTONIX) 40 MG tablet Take 1 tablet (40 mg total) by mouth daily  before breakfast., Starting Mon 05/01/2017, Normal    tamsulosin (FLOMAX) 0.4 MG CAPS capsule Take 1 capsule daily by mouth., Starting Fri 06/16/2017, Historical Med    TRELEGY ELLIPTA 100-62.5-25 MCG/INH AEPB Inhale 1 puff into the lungs daily. , Starting Thu 05/04/2017, Historical Med       No Known Allergies Discharge Instructions    Diet - low sodium heart healthy   Complete by:  As directed    Discharge instructions   Complete by:  As directed    It is important that you read following instructions as well as go over your medication list with RN to help you understand your care after this hospitalization.  Discharge Instructions: Please follow-up with PCP in one week  Please request your primary care physician to go over all Hospital Tests and Procedure/Radiological results at the follow up,  Please get all Hospital records sent to your PCP by signing hospital release before you go home.   Do not take more than prescribed Pain, Sleep and Anxiety Medications. You were cared for by a hospitalist during your hospital stay. If you have any questions about your discharge medications or the care you received while you were in the hospital after you are discharged, you can call the unit and ask to speak with the  hospitalist on call if the hospitalist that took care of you is not available.  Once you are discharged, your primary care physician will handle any further medical issues. Please note that NO REFILLS for any discharge medications will be authorized once you are discharged, as it is imperative that you return to your primary care physician (or establish a relationship with a primary care physician if you do not have one) for your aftercare needs so that they can reassess your need for medications and monitor your lab values. You Must read complete instructions/literature along with all the possible adverse reactions/side effects for all the Medicines you take and that have been  prescribed to you. Take any new Medicines after you have completely understood and accept all the possible adverse reactions/side effects. Wear Seat belts while driving. If you have smoked or chewed Tobacco in the last 2 yrs please stop smoking and/or stop any Recreational drug use.   Increase activity slowly   Complete by:  As directed      Discharge Exam: Filed Weights   07/22/17 1248 07/22/17 2101  Weight: 51.3 kg (113 lb) 52.2 kg (115 lb 1.3 oz)   Vitals:   07/23/17 1000 07/23/17 1458  BP: 98/62 (!) 110/59  Pulse: 90 95  Resp: 16 16  Temp: 99.2 F (37.3 C) 98.9 F (37.2 C)  SpO2: 94% 96%   General: Appear in no distress, no Rash; Oral Mucosa moist. Cardiovascular: S1 and S2 Present, no Murmur, no JVD Respiratory: Bilateral Air entry present and Clear to Auscultation, no Crackles, no wheezes Abdomen: Bowel Sound present, Soft and no tenderness Extremities: no Pedal edema, no calf tenderness Neurology: Grossly no focal neuro deficit.  The results of significant diagnostics from this hospitalization (including imaging, microbiology, ancillary and laboratory) are listed below for reference.    Significant Diagnostic Studies: Dg Chest 2 View  Result Date: 07/22/2017 CLINICAL DATA:  Chest pain. EXAM: CHEST  2 VIEW COMPARISON:  July 14, 2017 FINDINGS: The heart, hila, and mediastinum are normal. No pneumothorax. No nodules or masses. No focal infiltrates. No overt edema. IMPRESSION: No active cardiopulmonary disease. 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 Electronically Signed   By: Elon Alas M.D.   On: 07/23/2017 01:40   Result Date: 07/23/2017 CLINICAL DATA:  LEFT arm and facial numbness. Lower extremity tingling sensation. Assess for stroke. History of hypertension,  severe COPD, ACDF. EXAM: MRI HEAD WITHOUT CONTRAST MRA HEAD WITHOUT CONTRAST TECHNIQUE: Multiplanar, multiecho pulse sequences of the brain and surrounding structures were obtained without intravenous contrast. Angiographic images of the head were obtained using MRA technique without contrast. COMPARISON:  CT HEAD July 22, 2017 at 1258 hours an MRI of the cervical spine Feb 03, 2013 FINDINGS: MRI HEAD FINDINGS- moderately motion degraded axial SWAN sequence, some sequences are mildly motion degraded. BRAIN: No reduced diffusion to suggest acute ischemia. No susceptibility artifact to suggest hemorrhage though motion limits sensitivity for small hemorrhage. The ventricles and sulci are normal for patient's age. Old small LEFT cerebellar infarcts. Patchy supratentorial and pontine white matter FLAIR T2 hyperintensities. No suspicious parenchymal signal, mass or mass effect. No abnormal extra-axial fluid collections. VASCULAR: Normal major intracranial vascular flow voids present at skull base. SKULL AND UPPER CERVICAL SPINE: No abnormal sellar expansion. No suspicious calvarial bone marrow  signal. Craniocervical junction maintained. Status post ACDF. SINUSES/ORBITS: The mastoid air-cells and included paranasal sinuses are well-aerated. The included ocular globes and orbital contents are non-suspicious. OTHER: None. MRA HEAD FINDINGS ANTERIOR CIRCULATION: Normal flow related enhancement of the included cervical, petrous, cavernous and supraclinoid internal carotid arteries. 2 mm inferiorly directed aneurysm LEFT cavernous internal carotid artery. Patent anterior communicating artery. Patent anterior and middle cerebral arteries, including distal segments. No large vessel occlusion, flow limiting stenosis. POSTERIOR CIRCULATION: No flow related enhancement LEFT V4 segment proximally, retrograde flow reconstituted LEFT V4 distally. Basilar artery is patent, with normal flow related enhancement of the main branch  vessels. Tiny RIGHT P1 segment, with mid segment loss of flow related enhancement. Potential fetal origin RIGHT posterior cerebral artery. Small LEFT posterior communicating artery present. No large vessel occlusion, flow limiting stenosis,  aneurysm. ANATOMIC VARIANTS: None. Source images and MIP images were reviewed. IMPRESSION: MRI HEAD: 1. No acute intracranial process on this motion degraded examination 2. Mild chronic small vessel ischemic disease. 3. Multiple small LEFT cerebellar infarcts. MRA HEAD: 1. No emergent large vessel occlusion or flow limiting stenosis. 2. Chronically occluded LEFT vertebral artery with distal LEFT V4 reconstitution. 3. Slow flow versus occluded RIGHT P1 segment with robust RIGHT posterior communicating artery constituting RIGHT PCA. Electronically Signed: By: Elon Alas M.D. On: 07/23/2017 00:04   Dg Chest Port 1 View  Result Date: 07/14/2017 CLINICAL DATA:  Patient with shortness of breath for 2 days. EXAM: PORTABLE CHEST 1 VIEW COMPARISON:  Chest radiograph 05/16/2016. FINDINGS: Monitoring leads overlie the patient. Stable cardiac and mediastinal contours. Pulmonary vascular redistribution. Mild interstitial opacities bilaterally. No large area of pulmonary consolidation. No pleural effusion or pneumothorax. IMPRESSION: Pulmonary vascular redistribution with mild interstitial opacities which may represent mild edema. Infection not excluded. Electronically Signed   By: Lovey Newcomer M.D.   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 Electronically Signed   By: Elon Alas M.D.   On: 07/23/2017 01:40   Result Date: 07/23/2017 CLINICAL DATA:  LEFT arm and facial numbness. Lower extremity tingling sensation. Assess for stroke. History of hypertension, severe COPD, ACDF. EXAM: MRI HEAD WITHOUT CONTRAST MRA HEAD WITHOUT  CONTRAST TECHNIQUE: Multiplanar, multiecho pulse sequences of the brain and surrounding structures were obtained without intravenous contrast. Angiographic images of the head were obtained using MRA technique without contrast. COMPARISON:  CT HEAD July 22, 2017 at 1258 hours an MRI of the cervical spine Feb 03, 2013 FINDINGS: MRI HEAD FINDINGS- moderately motion degraded axial SWAN sequence, some sequences are mildly motion degraded. BRAIN: No reduced diffusion to suggest acute ischemia. No susceptibility artifact to suggest hemorrhage though motion limits sensitivity for small hemorrhage. The ventricles and sulci are normal for patient's age. Old small LEFT cerebellar infarcts. Patchy supratentorial and pontine white matter FLAIR T2 hyperintensities. No suspicious parenchymal signal, mass or mass effect. No abnormal extra-axial fluid collections. VASCULAR: Normal major intracranial vascular flow voids present at skull base. SKULL AND UPPER CERVICAL SPINE: No abnormal sellar expansion. No suspicious calvarial bone marrow signal. Craniocervical junction maintained. Status post ACDF. SINUSES/ORBITS: The mastoid air-cells and included paranasal sinuses are well-aerated. The included ocular globes and orbital contents are non-suspicious. OTHER: None. MRA HEAD FINDINGS ANTERIOR CIRCULATION: Normal flow related enhancement of the included cervical, petrous, cavernous and supraclinoid internal carotid arteries. 2 mm inferiorly directed aneurysm LEFT cavernous internal carotid artery.  Patent anterior communicating artery. Patent anterior and middle cerebral arteries, including distal segments. No large vessel occlusion, flow limiting stenosis. POSTERIOR CIRCULATION: No flow related enhancement LEFT V4 segment proximally, retrograde flow reconstituted LEFT V4 distally. Basilar artery is patent, with normal flow related enhancement of the main branch vessels. Tiny RIGHT P1 segment, with mid segment loss of flow related  enhancement. Potential fetal origin RIGHT posterior cerebral artery. Small LEFT posterior communicating artery present. No large vessel occlusion, flow limiting stenosis,  aneurysm. ANATOMIC VARIANTS: None. Source images and MIP images were reviewed. IMPRESSION: MRI HEAD: 1. No acute intracranial process on this motion degraded examination 2. Mild chronic small vessel ischemic disease. 3. Multiple small LEFT cerebellar infarcts. MRA HEAD: 1. No emergent large vessel occlusion or flow limiting stenosis. 2. Chronically occluded LEFT vertebral artery with distal LEFT V4 reconstitution. 3. Slow flow versus occluded RIGHT P1 segment with robust RIGHT posterior communicating artery constituting RIGHT PCA. Electronically Signed: By: Elon Alas M.D. On: 07/23/2017 00:04   Ct Head Code Stroke Wo Contrast  Result Date: 07/22/2017 CLINICAL DATA:  Code stroke. Focal neurologic deficit less than 6 hours. Suspect stroke EXAM: CT HEAD WITHOUT CONTRAST TECHNIQUE: Contiguous axial images were obtained from the base of the skull through the vertex without intravenous contrast. COMPARISON:  None. FINDINGS: Brain: No evidence of acute infarction, hemorrhage, hydrocephalus, extra-axial collection or mass lesion/mass effect. Vascular: Negative for hyperdense vessel Skull: Negative Sinuses/Orbits: Negative Other: None ASPECTS ([name] Stroke Program Early CT Score) - Ganglionic level infarction (caudate, lentiform nuclei, internal capsule, insula, M1-M3 cortex): 7 - Supraganglionic infarction (M4-M6 cortex): 3 Total score (0-10 with 10 being normal): 10 IMPRESSION: 1. Negative CT Head 2. ASPECTS is 10 These results were called by telephone at the time of interpretation on 07/22/2017 at 1:13 pm to Dr. Lacinda Axon , who verbally acknowledged these results. Electronically Signed   By: Franchot Gallo M.D.   On: 07/22/2017 13:14    Microbiology: No results found for this or any previous visit (from the past 240 hour(s)).    Labs: CBC: Recent Labs  Lab 07/22/17 1300 07/22/17 1304 07/22/17 2221  WBC 13.5*  --  11.8*  NEUTROABS 8.2*  --   --   HGB 15.8 16.7 13.5  HCT 44.7 49.0 39.0  MCV 88.0  --  87.4  PLT 350  --  025   Basic Metabolic Panel: Recent Labs  Lab 07/22/17 1300 07/22/17 1304 07/22/17 2221 07/23/17 0403  NA 136 137  --  138  K 3.4* 3.2*  --  3.2*  CL 100* 98*  --  105  CO2 26  --   --  25  GLUCOSE 162* 166*  --  79  BUN 20 19  --  16  CREATININE 1.44* 1.50* 1.39* 1.23  CALCIUM 9.3  --   --  8.6*   Liver Function Tests: Recent Labs  Lab 07/22/17 1300  AST 19  ALT 25  ALKPHOS 99  BILITOT 0.6  PROT 7.1  ALBUMIN 3.9   No results for input(s): LIPASE, AMYLASE in the last 168 hours. No results for input(s): AMMONIA in the last 168 hours. Cardiac Enzymes: Recent Labs  Lab 07/22/17 1300 07/22/17 1729  TROPONINI <0.03 <0.03   BNP (last 3 results) Recent Labs    07/14/17 1655  BNP 101.0*   CBG: Recent Labs  Lab 07/23/17 1707  GLUCAP 92   Time spent: 35 minutes  Signed:  Faisal Stradling  [facility] 07/23/2017  , 8:29 AM

## 2017-07-26 ENCOUNTER — Other Ambulatory Visit: Payer: Self-pay

## 2017-07-26 NOTE — Patient Outreach (Signed)
[facility]) Care Management  07/26/2017  Bryan [name] [birth year] [id number]   EMMI: Pneumonia Referral Date: 07/26/17 Referral Source: EMMI  Referral Reason:More short of breath than yesterday? yes Day # 8   Outreach attempt # 1 Spoke with patient he is able to verify HIPAA.  He states that he is doing well. Reviewed and addressed red alert with patient he reports that he thought he told the system that he was doing good.  Patient denies any shortness of breath or other problems. Patient noted to go to the hospital since last call. Patient states he went in because he was having some numbness to his arm and was only kept overnight.  Patient reports he is doing better with that and he will see the physician on Friday.  Patient offers no concerns.   Plan: RN CM will close case at this time as no needs identified and patient offers no concerns.  RN CM will notify care management assistant of case status.    Jone Baseman, RN, MSN [facility] Care Management Care Management Coordinator Direct Line [phone] Toll Free: [phone]  Fax: [phone]

## 2017-07-27 ENCOUNTER — Other Ambulatory Visit: Payer: Self-pay

## 2017-07-27 NOTE — Patient Outreach (Signed)
[facility]) Care Management  07/27/2017  Bryan [name] August 08, 1956 [id number]   EMMI: Pneumonia Referral Date: 07/27/17 Referral Source: EMMI Referral Reason: Had Diarrhea or felt sick to stomach -yes Day # 9    Outreach attempt # 1 Spoke with he is able to verify HIPAA.  Reviewed and addressed red alert.  Patient reports he is having some diarrhea.  Asked patient about how many times he went on yesterday he reports about 3-4. Asked patient about antibiotic use and he states he finished his antibiotic on yesterday.  Advised patient that the antibiotic could be causing his diarrhea.  He verbalized understanding.  Advised patient to drink plenty of fluids and try bananas, applesauce, or yogurt to help as well.  He verbalized understanding and adds he will see his doctor on tomorrow at 9 am. Advised patient to discuss with his physician in the visit tomorrow.  He verbalized understanding. Patient states he does have transportation to his physician.    Advised patient that they would continue to get automated EMMI-PNA post discharge calls to assess how they are doing following recent hospitalization and will receive a call from a nurse if any of their responses were abnormal. Patient voiced understanding and was appreciative of f/u call.    Plan: RN CM will close case at this time. RN CM will notify care management assistant of case status.    Jone Baseman, RN, MSN [facility] Care Management Care Management Coordinator Direct Line [phone] Toll Free: [phone]  Fax: [phone]

## 2017-07-28 ENCOUNTER — Ambulatory Visit (INDEPENDENT_AMBULATORY_CARE_PROVIDER_SITE_OTHER): Payer: Medicare Other | Admitting: Urology

## 2017-07-28 ENCOUNTER — Other Ambulatory Visit: Payer: Self-pay

## 2017-07-28 DIAGNOSIS — R350 Frequency of micturition: Secondary | ICD-10-CM

## 2017-07-28 DIAGNOSIS — N401 Enlarged prostate with lower urinary tract symptoms: Secondary | ICD-10-CM | POA: Diagnosis not present

## 2017-07-28 DIAGNOSIS — I10 Essential (primary) hypertension: Secondary | ICD-10-CM | POA: Diagnosis not present

## 2017-07-28 DIAGNOSIS — G459 Transient cerebral ischemic attack, unspecified: Secondary | ICD-10-CM | POA: Diagnosis not present

## 2017-07-28 DIAGNOSIS — R945 Abnormal results of liver function studies: Secondary | ICD-10-CM | POA: Diagnosis not present

## 2017-07-28 DIAGNOSIS — R3912 Poor urinary stream: Secondary | ICD-10-CM | POA: Diagnosis not present

## 2017-07-28 DIAGNOSIS — E1165 Type 2 diabetes mellitus with hyperglycemia: Secondary | ICD-10-CM | POA: Diagnosis not present

## 2017-07-28 DIAGNOSIS — R351 Nocturia: Secondary | ICD-10-CM | POA: Diagnosis not present

## 2017-07-28 DIAGNOSIS — J449 Chronic obstructive pulmonary disease, unspecified: Secondary | ICD-10-CM | POA: Diagnosis not present

## 2017-07-28 DIAGNOSIS — C61 Malignant neoplasm of prostate: Secondary | ICD-10-CM | POA: Diagnosis not present

## 2017-07-28 DIAGNOSIS — Z0000 Encounter for general adult medical examination without abnormal findings: Secondary | ICD-10-CM | POA: Diagnosis not present

## 2017-07-28 NOTE — Patient Outreach (Signed)
[facility]) Care Management  07/28/2017  Bryan [name] 12/03/1955 [id number]   EMMI: Pneumonia Referral Date: 07/28/17 Referral Source: EMMI Referral Reason: Had diarrhea or felt sick to stomach-yes Day # 10  EMMI question addressed yesterday.  Patient has an appointment physician on today.    Plan: RN CM will close case and notify care management assistant.   Jone Baseman, RN, MSN [facility] Care Management Care Management Coordinator Direct Line [phone] Toll Free: [phone]  Fax: [phone]

## 2017-07-31 ENCOUNTER — Other Ambulatory Visit: Payer: Self-pay

## 2017-07-31 NOTE — Patient Outreach (Signed)
[facility]) Care Management  07/31/2017  Bryan [name] Nov 12, 1955 [id number]   EMMI: Pneumonia Referral Date: 07/30/17 Referral Source: EMMI Referral Reason: Had flu shot-no Had pneumonia shot-no Day # 3   Outreach attempt # 1 Spoke with patient.  He is able to verify HIPAA.  He reports he is doing good. Reviewed and addressed red alert.  Patient reports he has not received flu and pneumonia vaccine.  Advised patient that he could get those vaccines at his local pharmacy.  He verbalized understanding.  He states that he got sick from the flu vaccine previously and has not taken it since.  He saw the primary doctor on Friday.  He states visit went well and that he was diagnosed with borderline diabetes and that his doctor gave him a machine and information. Discussed with patient [place] and how we could help him with his new diagnosis.  Patient is agreeable to health coach for disease management of his borderline diabetes.      Plan: RN CM will refer patient to health coach for disease management of his borderline diabetes.   RN CM will sign off at this time.    Jone Baseman, RN, MSN [facility] Care Management Care Management Coordinator Direct Line [phone] Toll Free: [phone]  Fax: [phone]

## 2017-08-02 ENCOUNTER — Other Ambulatory Visit: Payer: Self-pay

## 2017-08-02 NOTE — Patient Outreach (Signed)
[facility]) Care Management  08/02/2017  Bryan [name] [birth year] [id number]   Patient returned call. HIPAA verified. Introduced myself as the Engineer, maintenance.  Informed the patient about our next call.  Asked the patient what are his concerns regarding his DM.  Plan: RN Health Coach will outreach the patient in the month of November.  [name] RN, BSN, [place] Direct Dial:  [phone] Fax: [phone]

## 2017-08-02 NOTE — Patient Outreach (Signed)
[place] [facility]) Care Management  08/02/2017  Bryan [name] 19-Oct-1955 [id number]   1st Telephone call to patient for initial assessment. Patient not at home.   Left HIPAA compliant voice message and contact information with family member.  Plan: RN Health Coach will make outreach attempt to patient within the month of November.  [name] RN, BSN, [place] Direct Dial:  [phone] Fax: [phone]

## 2017-08-07 ENCOUNTER — Other Ambulatory Visit: Payer: Self-pay

## 2017-08-07 ENCOUNTER — Other Ambulatory Visit: Payer: Self-pay | Admitting: Urology

## 2017-08-07 NOTE — Patient Outreach (Signed)
[place] [facility]) Care Management  08/07/2017  LAVERN MASLOW [birth year] [id number]   1st Telephone call to patient for initial assessment.  No answer. HIPAA compliant voice message left with contact information.  Plan: RN Health Coach will attempt an outreach call within three business day.  [name] RN, BSN, [place] Direct Dial:  [phone] Fax: [phone]

## 2017-08-08 ENCOUNTER — Ambulatory Visit: Payer: Self-pay

## 2017-08-09 ENCOUNTER — Other Ambulatory Visit: Payer: Self-pay

## 2017-08-09 NOTE — Patient Outreach (Signed)
[facility] [facility] Care Management  [place]  08/09/2017   Bryan [name] 1956-04-28 [id number]    EMMI: Pneumonia Referral Date: 08/09/2017 Referral Source: EMMI Referral Reason: Been around smoke Day # 22 Outreach attempt # 1 Spoke with the patient. HIPAA verified. He states that he is feeling well today.  RN Health Coach addressed red alert. Patient reports that his son smokes a cigar and he is around the smoke. RN Health Coach dicussed the effects smoke can cause to his lungs. RN Health Coach discussed options that his son could try to avoid being around him if he is smoking. The patient verbalized understanding.     Subjective: Telephone call to the patient for initial assessment. HIPAA verified. The patient was referred to me for new diagnosis of pre diabetes. The patient states he is well.  He lives with his son.  He is able to independently perform his ADLS'/IADLS. The patient had not checked his blood sugar today but yesterday he stated that fasting it was 115. RN Health Coach spoke with the patient concerning the benefits of checking his blood sugars daily. He verbalized understanding.   The patient states that he is adherent with his medications. He has no problems with purchasing his medication at this time. The patient states that he is not trying to follow a diabetic diet. RN Health coach advised him that trying to watch his diet now could prevent him moving to the diabatic stage.He verbalized understanding.  He states that he is active.He tries to walk daily and doing chores around the home.  Objective:   Encounter Medications:  Outpatient Encounter Medications as of 08/09/2017  Medication Sig  . albuterol (PROVENTIL) (2.5 MG/3ML) 0.083% nebulizer solution Take 3 mLs (2.5 mg total) every 4 (four) hours as needed by nebulization for wheezing or shortness of breath.  . AMITIZA 24 MCG capsule Take 1 capsule 2 (two) times daily by mouth.  Marland Kitchen aspirin 325 MG  tablet Take 1 tablet (325 mg total) daily by mouth.  Marland Kitchen atorvastatin (LIPITOR) 40 MG tablet Take 1 tablet (40 mg total) daily by mouth.  . benzonatate (TESSALON) 100 MG capsule Take 1 capsule (100 mg total) 3 (three) times daily as needed by mouth for cough.  . blood glucose meter kit and supplies Dispense based on patient and insurance preference. Use up to four times daily as directed. (FOR ICD-9 250.00, 250.01).  Marland Kitchen gabapentin (NEURONTIN) 300 MG capsule Take 300 mg by mouth 2 (two) times daily.  Marland Kitchen ipratropium-albuterol (DUONEB) 0.5-2.5 (3) MG/3ML SOLN Take 3 mLs every 6 (six) hours as needed by nebulization (wheezing).  Marland Kitchen lisinopril-hydrochlorothiazide (PRINZIDE,ZESTORETIC) 10-12.5 MG tablet Take 1 tablet by mouth daily.  . meloxicam (MOBIC) 7.5 MG tablet Take 7.5 mg by mouth daily as needed for pain.   . metFORMIN (GLUCOPHAGE) 500 MG tablet Take 1 tablet (500 mg total) daily with breakfast by mouth.  . pantoprazole (PROTONIX) 40 MG tablet Take 1 tablet (40 mg total) by mouth daily before breakfast.  . tamsulosin (FLOMAX) 0.4 MG CAPS capsule Take 1 capsule daily by mouth.  . TRELEGY ELLIPTA 100-62.5-25 MCG/INH AEPB Inhale 1 puff into the lungs daily.   Marland Kitchen albuterol (PROVENTIL HFA;VENTOLIN HFA) 108 (90 Base) MCG/ACT inhaler Inhale 1-2 puffs every 4 (four) hours as needed into the lungs for wheezing or shortness of breath. Do not use with nebulizer  . HYDROcodone-acetaminophen (NORCO) 10-325 MG tablet Take 1 tablet by mouth 3 (three) times daily as needed.  No facility-administered encounter medications on file as of 08/09/2017.     Functional Status:  In your present state of health, do you have any difficulty performing the following activities: 07/23/2017 07/14/2017  Hearing? N N  Vision? N N  Difficulty concentrating or making decisions? N N  Walking or climbing stairs? N Y  Comment - pt gets short of breath  Dressing or bathing? N N  Doing errands, shopping? N N  Comment - pt states he  needs someone with him  Some recent data might be hidden    Fall/Depression Screening: Fall Risk  08/09/2017  Falls in the past year? No   PHQ 2/9 Scores 08/09/2017  PHQ - 2 Score 0    Assessment: Patient will benefit from health coach outreach for disease management and support.   THN CM Care Plan Problem One     Most Recent Value  Role Documenting the Problem One  [place] for Problem One  Active  THN Long Term Goal   in 90 days the patient will verbalize that his a1c has not risen above 6.5  THN Long Term Goal Start Date  08/09/17  Interventions for Problem One [place] will send education information to the patient to learn about diabetes and his a1c  THN CM Short Term Goal #1   in 30 days the patient will verbalize that he is checking his blood sugars daily.  THN CM Short Term Goal #1 Start Date  08/09/17  Interventions for Short Term Goal #1  RN Health Coach discussed with the patient the benefits of checking his blood sugars daily  THN CM Short Term Goal #2   In 30 days the patient verbalize two foods that can affect his blood sugars   THN CM Short Term Goal #2 Start Date  08/09/17  Interventions for Short Term Goal #2  RN Health Coach will send information about diabetic diet.       Plan: RN Health Coach will provide ongoing education for patient on Diabetes through phone calls and sending printed information to patient for further discussion.  RN Health Coach will send welcome packet with consent to patient as well as printed information on heart failure.  RN Health Coach will send initial barriers letter, assessment, and care plan to primary care physician. RN Health Coach will contact patient in the month of December and patient agrees to next outreach  [place], BSN, [place]:  [phone] Fax: [phone]    .

## 2017-08-11 DIAGNOSIS — M545 Low back pain: Secondary | ICD-10-CM | POA: Diagnosis not present

## 2017-08-11 DIAGNOSIS — M961 Postlaminectomy syndrome, not elsewhere classified: Secondary | ICD-10-CM | POA: Diagnosis not present

## 2017-08-11 DIAGNOSIS — M5416 Radiculopathy, lumbar region: Secondary | ICD-10-CM | POA: Diagnosis not present

## 2017-08-11 DIAGNOSIS — G894 Chronic pain syndrome: Secondary | ICD-10-CM | POA: Diagnosis not present

## 2017-08-14 ENCOUNTER — Encounter (HOSPITAL_COMMUNITY): Payer: Self-pay

## 2017-08-14 NOTE — Patient Instructions (Signed)
Bryan [name]  08/14/2017   Your procedure is scheduled on: 08-17-17  Report to [facility] Main  Entrance Take [place]  elevators to 3rd floor to  [place] at     815 AM.    Call this number if you have problems the morning of surgery [phone]    Remember: ONLY 1 PERSON MAY GO WITH YOU TO SHORT STAY TO GET  READY MORNING OF [place].  Do not eat food or drink liquids :After Midnight.     Take these medicines the morning of surgery with A SIP OF WATER: inhalers and bring, flomax, protonix, nebulizer, gabapentin, atorvastatin DO NOT TAKE ANY DIABETIC MEDICATIONS DAY OF YOUR SURGERY                               You may not have any metal on your body including hair pins and              piercings  Do not wear jewelry lotions, powders or perfumes, deodorant                      Men may shave face and neck.   Do not bring valuables to the hospital. [place].  Contacts, dentures or bridgework may not be worn into surgery.  Leave suitcase in the car. After surgery it may be brought to your room.                  Please read over the following fact sheets you were given: _____________________________________________________________________           [facility] - Preparing for Surgery Before surgery, you can play an important role.  Because skin is not sterile, your skin needs to be as free of germs as possible.  You can reduce the number of germs on your skin by washing with CHG (chlorahexidine gluconate) soap before surgery.  CHG is an antiseptic cleaner which kills germs and bonds with the skin to continue killing germs even after washing. Please DO NOT use if you have an allergy to CHG or antibacterial soaps.  If your skin becomes reddened/irritated stop using the CHG and inform your nurse when you arrive at Short Stay. Do not shave (including legs and underarms) for at least 48 hours prior to the  first CHG shower.  You may shave your face/neck. Please follow these instructions carefully:  1.  Shower with CHG Soap the night before surgery and the  morning of Surgery.  2.  If you choose to wash your hair, wash your hair first as usual with your  normal  shampoo.  3.  After you shampoo, rinse your hair and body thoroughly to remove the  shampoo.                           4.  Use CHG as you would any other liquid soap.  You can apply chg directly  to the skin and wash                       Gently with a scrungie or clean washcloth.  5.  Apply the CHG  Soap to your body ONLY FROM THE NECK DOWN.   Do not use on face/ open                           Wound or open sores. Avoid contact with eyes, ears mouth and genitals (private parts).                       Wash face,  Genitals (private parts) with your normal soap.             6.  Wash thoroughly, paying special attention to the area where your surgery  will be performed.  7.  Thoroughly rinse your body with warm water from the neck down.  8.  DO NOT shower/wash with your normal soap after using and rinsing off  the CHG Soap.                9.  Pat yourself dry with a clean towel.            10.  Wear clean pajamas.            11.  Place clean sheets on your bed the night of your first shower and do not  sleep with pets. Day of Surgery : Do not apply any lotions/deodorants the morning of surgery.  Please wear clean clothes to the hospital/surgery center.  FAILURE TO FOLLOW THESE INSTRUCTIONS MAY RESULT IN THE CANCELLATION OF YOUR SURGERY PATIENT SIGNATURE_________________________________  NURSE SIGNATURE__________________________________  ________________________________________________________________________

## 2017-08-15 ENCOUNTER — Other Ambulatory Visit: Payer: Self-pay

## 2017-08-15 ENCOUNTER — Encounter (HOSPITAL_COMMUNITY): Payer: Self-pay

## 2017-08-15 ENCOUNTER — Encounter (HOSPITAL_COMMUNITY)
Admission: RE | Admit: 2017-08-15 | Discharge: 2017-08-15 | Disposition: A | Discharge status: Home / Self Care | Payer: Medicare Other | Source: Ambulatory Visit | Attending: Urology | Admitting: Urology

## 2017-08-15 DIAGNOSIS — Z7982 Long term (current) use of aspirin: Secondary | ICD-10-CM | POA: Diagnosis not present

## 2017-08-15 DIAGNOSIS — I10 Essential (primary) hypertension: Secondary | ICD-10-CM | POA: Diagnosis not present

## 2017-08-15 DIAGNOSIS — Z79899 Other long term (current) drug therapy: Secondary | ICD-10-CM | POA: Diagnosis not present

## 2017-08-15 DIAGNOSIS — J449 Chronic obstructive pulmonary disease, unspecified: Secondary | ICD-10-CM | POA: Diagnosis not present

## 2017-08-15 DIAGNOSIS — R350 Frequency of micturition: Secondary | ICD-10-CM | POA: Diagnosis not present

## 2017-08-15 DIAGNOSIS — R3912 Poor urinary stream: Secondary | ICD-10-CM | POA: Diagnosis not present

## 2017-08-15 DIAGNOSIS — Z7984 Long term (current) use of oral hypoglycemic drugs: Secondary | ICD-10-CM | POA: Diagnosis not present

## 2017-08-15 DIAGNOSIS — R351 Nocturia: Secondary | ICD-10-CM | POA: Diagnosis not present

## 2017-08-15 DIAGNOSIS — Z87891 Personal history of nicotine dependence: Secondary | ICD-10-CM | POA: Diagnosis not present

## 2017-08-15 DIAGNOSIS — N401 Enlarged prostate with lower urinary tract symptoms: Secondary | ICD-10-CM | POA: Diagnosis not present

## 2017-08-15 DIAGNOSIS — R3915 Urgency of urination: Secondary | ICD-10-CM | POA: Diagnosis not present

## 2017-08-15 DIAGNOSIS — N138 Other obstructive and reflux uropathy: Secondary | ICD-10-CM | POA: Diagnosis not present

## 2017-08-15 HISTORY — DX: Type 2 diabetes mellitus without complications: E11.9

## 2017-08-15 HISTORY — DX: Headache, unspecified: R51.9

## 2017-08-15 HISTORY — DX: Prediabetes: R73.03

## 2017-08-15 HISTORY — DX: Headache: R51

## 2017-08-15 HISTORY — DX: Cerebral infarction, unspecified: I63.9

## 2017-08-15 LAB — CBC
HCT: 40.3 % (ref 39.0–52.0)
Hemoglobin: 13.6 g/dL (ref 13.0–17.0)
MCH: 30.3 pg (ref 26.0–34.0)
MCHC: 33.7 g/dL (ref 30.0–36.0)
MCV: 89.8 fL (ref 78.0–100.0)
PLATELETS: 275 10*3/uL (ref 150–400)
RBC: 4.49 MIL/uL (ref 4.22–5.81)
RDW: 14.6 % (ref 11.5–15.5)
WBC: 7 10*3/uL (ref 4.0–10.5)

## 2017-08-15 LAB — BASIC METABOLIC PANEL
Anion gap: 7 (ref 5–15)
BUN: 15 mg/dL (ref 6–20)
CALCIUM: 8.4 mg/dL — AB (ref 8.9–10.3)
CO2: 28 mmol/L (ref 22–32)
CREATININE: 1.2 mg/dL (ref 0.61–1.24)
Chloride: 107 mmol/L (ref 101–111)
GFR calc Af Amer: >60 mL/min (ref >60)
GFR calc non Af Amer: >60 mL/min (ref >60)
GLUCOSE: 108 mg/dL — AB (ref 65–99)
Potassium: 4 mmol/L (ref 3.5–5.1)
Sodium: 142 mmol/L (ref 135–145)

## 2017-08-15 LAB — GLUCOSE, CAPILLARY: GLUCOSE-CAPILLARY: 111 mg/dL — AB (ref 65–99)

## 2017-08-15 NOTE — Progress Notes (Signed)
Discussed pt. Recent med hx. With Dr. Glennon Mac anesthesia. Pt. Ok to proceed with surgery.

## 2017-08-16 NOTE — H&P (Signed)
CC/HPI: Frequency, Nocturia and Urgency     Bryan [name] for further evaluation of his 7 month history of nocturia. He a foley placed in the last year for urinary retention. He has nocturia 5-6x and frequency 10-12x with a slow stream and intermittency. He feels he empties but has some UUI. He didn't improve with ditropan, Mybetriq or tamsulosin. Cystoscopy at his last visit showed BPH with BOO and a PF of ml/sec. He has some dysuria and the urine has a burned odor but his urine is clear. He has some pain in his scrotum as well. He has had no hematuria. His IPSS today is 23. He had BPH with BOO on cystoscopy at his last visit.     CC: AUA Questions Scoring.  HPI: Bryan [name] is a 61 year-old male established patient who is here AUA Questions.    QOL   1. 5  yes  1  yes     AUA Symptom Score: More than 50% of the time he has the sensation of not emptying his bladder completely when finished urinating. More than 50% of the time he has to urinate again fewer than two hours after he has finished urinating. 50% of the time he has to start and stop again several times when he urinates. 50% of the time he finds it difficult to postpone urination. 50% of the time he has a weak urinary stream. 50% of the time he has to push or strain to begin urination. He has to get up to urinate 3 times from the time he goes to bed until the time he gets up in the morning.   Calculated AUA Symptom Score: 23    ALLERGIES: No Allergies    MEDICATIONS: Aspirin 325 mg tablet  Metformin Hcl 500 mg tablet  Tamsulosin Hcl 0.4 mg capsule, ext release 24 hr 1 capsule PO Daily  Amitiza 24 mcg capsule  Atorvastatin Calcium 40 mg tablet  Inhaler  Lisinopril-Hydrochlorothiazide 10 mg-12.5 mg tablet  Meloxicam  Pantoprazole Sodium 40 mg tablet, delayed release     GU PSH: Complex Uroflow - 06/16/2017 Cystoscopy - 06/16/2017 Prostate Needle Biopsy - about 2015      PSH Notes: Back Surgery   NON-GU PSH: None   GU  PMH: Weak Urinary Stream - 06/16/2017 Prostate nodule w/ LUTS, Nodular prostate with lower urinary tract symptoms - 2015 Urge incontinence, Urge incontinence of urine - 2015 Urinary Frequency, Increased urinary frequency - 2015    NON-GU PMH: Personal history of other diseases of the digestive system, History of gastric ulcer - 2015, History of esophageal reflux, - 2015 Personal history of other diseases of the musculoskeletal system and connective tissue, History of arthritis - 2015 Personal history of other diseases of the respiratory system, History of asthma - 2015 Encounter for general adult medical examination without abnormal findings, Encounter for preventive health examination    FAMILY HISTORY: None   SOCIAL HISTORY: Marital Status: Single Current Smoking Status: Patient does not smoke anymore. Smoked for 40 years. Smoked 2 packs per day.   Tobacco Use Assessment Completed: Used Tobacco in last 30 days?     Notes: Alcohol use, Single, Tobacco use, Caffeine use he uses occasional marijuana.    REVIEW OF SYSTEMS:    GU Review Male:   Patient reports burning/ pain with urination and get up at night to urinate. Patient denies frequent urination, hard to postpone urination, leakage of urine, stream starts and stops, trouble starting your stream, have to strain to  urinate , erection problems, and penile pain.  Gastrointestinal (Upper):   Patient denies nausea, vomiting, and indigestion/ heartburn.  Gastrointestinal (Lower):   Patient denies diarrhea and constipation.  Constitutional:   Patient denies fever, night sweats, weight loss, and fatigue.  Skin:   Patient denies skin rash/ lesion and itching.  Eyes:   Patient denies blurred vision and double vision.  Ears/ Nose/ Throat:   Patient denies sore throat and sinus problems.  Hematologic/Lymphatic:   Patient denies swollen glands and easy bruising.  Cardiovascular:   Patient denies leg swelling and chest pains.  Respiratory:    Patient reports shortness of breath. Patient denies cough.  Endocrine:   Patient denies excessive thirst.  Musculoskeletal:   Patient reports back pain and joint pain.   Neurological:   Patient denies headaches and dizziness.  Psychologic:   Patient denies depression and anxiety.   VITAL SIGNS:      07/28/2017 10:19 AM  BP 120/75 mmHg  Pulse 78 /min  Temperature 98.3 F / 36.8 C   MULTI-SYSTEM PHYSICAL EXAMINATION:    Constitutional: Well-nourished. No physical deformities. Normally developed. Good grooming.  Respiratory: No labored breathing, no use of accessory muscles. CTA  Cardiovascular: Normal temperature, RRR without murmur     PAST DATA REVIEWED:  Source Of History:  Patient  Records Review:   AUA Symptom Score  Urine Test Review:   Urinalysis  Urodynamics Review:   Review Flow Rate   05/05/17  PSA  Total PSA 1.7 ng/dl    PROCEDURES:           Flow Rate - 51741  Flow Time: 0:10 min:sec  Voided Volume: 63 cc  Peak Flow Rate: 0.09 cc/sec  Time of Peak Flow: 0:02 min:sec  Average Flow Rate: 6 cc/sec  Total Void Time: 0:10 min:sec         Urinalysis - 81003 Dipstick Dipstick Cont'd  Specimen: Voided Bilirubin: Neg  Color: Yellow Ketones: Neg  Appearance: Clear Blood: Neg  Specific Gravity: <= 1.005 Protein: Neg  pH: 5.0 Urobilinogen: 0.2  Glucose: Neg Nitrites: Neg    Leukocyte Esterase: Neg    ASSESSMENT:      ICD-10 Details  1 GU:   BPH w/LUTS - N40.1 He has failed medical therapy for his BPH with LUTS. I discussed treatment options and feel a TURP would be the best for his situation. I reviewd the risks of a TURP including bleeding, infection, incontinence, stricture, need for secondary procedures, ejaculatory and erectile dysfunction, thrombotic events, fluid overload and anesthetic complications. I explained that 95% of men will have relief of the obstructive symptoms and about 70% will have relief of the irritative symptoms. I will get him scheduled in  the near future.   2   Urinary Frequency - R35.0   3   Weak Urinary Stream - R39.12   4   Nocturia - R35.1

## 2017-08-17 ENCOUNTER — Other Ambulatory Visit: Payer: Self-pay

## 2017-08-17 ENCOUNTER — Ambulatory Visit (HOSPITAL_COMMUNITY): Payer: Medicare Other | Admitting: Anesthesiology

## 2017-08-17 ENCOUNTER — Observation Stay (HOSPITAL_COMMUNITY)
Admission: RE | Admit: 2017-08-17 | Discharge: 2017-08-18 | Disposition: A | Discharge status: Home / Self Care | Payer: Medicare Other | Source: Ambulatory Visit | Attending: Urology | Admitting: Urology

## 2017-08-17 ENCOUNTER — Encounter (HOSPITAL_COMMUNITY): Payer: Self-pay | Admitting: Emergency Medicine

## 2017-08-17 ENCOUNTER — Encounter (HOSPITAL_COMMUNITY)
Admission: RE | Disposition: A | Discharge status: Home / Self Care | Payer: Self-pay | Source: Ambulatory Visit | Attending: Urology

## 2017-08-17 DIAGNOSIS — N138 Other obstructive and reflux uropathy: Secondary | ICD-10-CM | POA: Insufficient documentation

## 2017-08-17 DIAGNOSIS — N401 Enlarged prostate with lower urinary tract symptoms: Principal | ICD-10-CM | POA: Insufficient documentation

## 2017-08-17 DIAGNOSIS — Z7982 Long term (current) use of aspirin: Secondary | ICD-10-CM | POA: Diagnosis not present

## 2017-08-17 DIAGNOSIS — Z87891 Personal history of nicotine dependence: Secondary | ICD-10-CM | POA: Insufficient documentation

## 2017-08-17 DIAGNOSIS — G459 Transient cerebral ischemic attack, unspecified: Secondary | ICD-10-CM | POA: Diagnosis not present

## 2017-08-17 DIAGNOSIS — R350 Frequency of micturition: Secondary | ICD-10-CM | POA: Insufficient documentation

## 2017-08-17 DIAGNOSIS — I10 Essential (primary) hypertension: Secondary | ICD-10-CM | POA: Diagnosis not present

## 2017-08-17 DIAGNOSIS — R3915 Urgency of urination: Secondary | ICD-10-CM | POA: Diagnosis not present

## 2017-08-17 DIAGNOSIS — R3912 Poor urinary stream: Secondary | ICD-10-CM | POA: Insufficient documentation

## 2017-08-17 DIAGNOSIS — Z7984 Long term (current) use of oral hypoglycemic drugs: Secondary | ICD-10-CM | POA: Diagnosis not present

## 2017-08-17 DIAGNOSIS — R351 Nocturia: Secondary | ICD-10-CM | POA: Insufficient documentation

## 2017-08-17 DIAGNOSIS — Z79899 Other long term (current) drug therapy: Secondary | ICD-10-CM | POA: Diagnosis not present

## 2017-08-17 DIAGNOSIS — K219 Gastro-esophageal reflux disease without esophagitis: Secondary | ICD-10-CM | POA: Diagnosis not present

## 2017-08-17 DIAGNOSIS — J449 Chronic obstructive pulmonary disease, unspecified: Secondary | ICD-10-CM | POA: Diagnosis not present

## 2017-08-17 DIAGNOSIS — N400 Enlarged prostate without lower urinary tract symptoms: Secondary | ICD-10-CM | POA: Diagnosis not present

## 2017-08-17 HISTORY — PX: TRANSURETHRAL RESECTION OF PROSTATE: SHX73

## 2017-08-17 LAB — GLUCOSE, CAPILLARY
GLUCOSE-CAPILLARY: 81 mg/dL (ref 65–99)
GLUCOSE-CAPILLARY: 87 mg/dL (ref 65–99)
GLUCOSE-CAPILLARY: 179 mg/dL — AB (ref 65–99)
GLUCOSE-CAPILLARY: 180 mg/dL — AB (ref 65–99)

## 2017-08-17 SURGERY — TURP (TRANSURETHRAL RESECTION OF PROSTATE)
Anesthesia: General | Site: Prostate

## 2017-08-17 MED ORDER — HYDROMORPHONE HCL 1 MG/ML IJ SOLN
INTRAMUSCULAR | Status: AC
Start: 2017-08-17 — End: 2017-08-17
  Filled 2017-08-17: qty 1

## 2017-08-17 MED ORDER — INSULIN ASPART 100 UNIT/ML ~~LOC~~ SOLN
0.0000 [IU] | Freq: Three times a day (TID) | SUBCUTANEOUS | Status: DC
  Administered 2017-08-17: 3 [IU] via SUBCUTANEOUS

## 2017-08-17 MED ORDER — MIDAZOLAM HCL 2 MG/2ML IJ SOLN
INTRAMUSCULAR | Status: AC
  Filled 2017-08-17: qty 2

## 2017-08-17 MED ORDER — FLEET ENEMA 7-19 GM/118ML RE ENEM: 1.0000 | ENEMA | Freq: Once | RECTAL | Status: DC | PRN

## 2017-08-17 MED ORDER — PROPOFOL 10 MG/ML IV BOLUS
INTRAVENOUS | Status: AC
  Filled 2017-08-17: qty 20

## 2017-08-17 MED ORDER — ATORVASTATIN CALCIUM 40 MG PO TABS
40.0000 mg | ORAL_TABLET | Freq: Every day | ORAL | Status: DC
  Administered 2017-08-17: 40 mg via ORAL
  Filled 2017-08-17 (×2): qty 1

## 2017-08-17 MED ORDER — LIDOCAINE 2% (20 MG/ML) 5 ML SYRINGE
INTRAMUSCULAR | Status: DC | PRN
  Administered 2017-08-17: 100 mg via INTRAVENOUS

## 2017-08-17 MED ORDER — LIDOCAINE 2% (20 MG/ML) 5 ML SYRINGE
INTRAMUSCULAR | Status: AC
  Filled 2017-08-17: qty 5

## 2017-08-17 MED ORDER — OXYBUTYNIN CHLORIDE 5 MG PO TABS: 5.0000 mg | ORAL_TABLET | Freq: Three times a day (TID) | ORAL | Status: DC | PRN

## 2017-08-17 MED ORDER — ONDANSETRON HCL 4 MG/2ML IJ SOLN
INTRAMUSCULAR | Status: AC
  Filled 2017-08-17: qty 2

## 2017-08-17 MED ORDER — DEXAMETHASONE SODIUM PHOSPHATE 10 MG/ML IJ SOLN
INTRAMUSCULAR | Status: DC | PRN
  Administered 2017-08-17: 10 mg via INTRAVENOUS

## 2017-08-17 MED ORDER — POTASSIUM CHLORIDE IN NACL 20-0.45 MEQ/L-% IV SOLN
INTRAVENOUS | Status: DC
  Administered 2017-08-17 – 2017-08-18 (×3): via INTRAVENOUS
  Filled 2017-08-17 (×3): qty 1000

## 2017-08-17 MED ORDER — HYDROMORPHONE HCL 1 MG/ML IJ SOLN: 0.5000 mg | INTRAMUSCULAR | Status: DC | PRN

## 2017-08-17 MED ORDER — MELOXICAM 7.5 MG PO TABS
7.5000 mg | ORAL_TABLET | Freq: Every day | ORAL | Status: DC
  Administered 2017-08-17: 7.5 mg via ORAL
  Filled 2017-08-17 (×2): qty 1

## 2017-08-17 MED ORDER — BENZONATATE 100 MG PO CAPS
100.0000 mg | ORAL_CAPSULE | Freq: Three times a day (TID) | ORAL | Status: DC | PRN
Start: 2017-08-17 — End: 2017-08-18

## 2017-08-17 MED ORDER — BUDESONIDE 0.25 MG/2ML IN SUSP
0.2500 mg | Freq: Two times a day (BID) | RESPIRATORY_TRACT | Status: DC
  Filled 2017-08-17: qty 2

## 2017-08-17 MED ORDER — ONDANSETRON HCL 4 MG/2ML IJ SOLN: 4.0000 mg | INTRAMUSCULAR | Status: DC | PRN

## 2017-08-17 MED ORDER — PHENYLEPHRINE HCL 10 MG/ML IJ SOLN
INTRAMUSCULAR | Status: DC | PRN
  Administered 2017-08-17: 80 ug via INTRAVENOUS

## 2017-08-17 MED ORDER — PANTOPRAZOLE SODIUM 40 MG PO TBEC
40.0000 mg | DELAYED_RELEASE_TABLET | Freq: Every day | ORAL | Status: DC
  Administered 2017-08-18: 40 mg via ORAL
  Filled 2017-08-17: qty 1

## 2017-08-17 MED ORDER — BELLADONNA-OPIUM 16.2-30 MG RE SUPP
1.0000 | Freq: Once | RECTAL | Status: AC
  Administered 2017-08-17: 1 via RECTAL

## 2017-08-17 MED ORDER — HYDROMORPHONE HCL 1 MG/ML IJ SOLN
0.2500 mg | INTRAMUSCULAR | Status: DC | PRN
  Administered 2017-08-17 (×2): 0.5 mg via INTRAVENOUS

## 2017-08-17 MED ORDER — DEXAMETHASONE SODIUM PHOSPHATE 10 MG/ML IJ SOLN
INTRAMUSCULAR | Status: AC
  Filled 2017-08-17: qty 1

## 2017-08-17 MED ORDER — FENTANYL CITRATE (PF) 100 MCG/2ML IJ SOLN
INTRAMUSCULAR | Status: AC
  Filled 2017-08-17: qty 2

## 2017-08-17 MED ORDER — OXYCODONE HCL 5 MG PO TABS
5.0000 mg | ORAL_TABLET | ORAL | Status: DC | PRN
  Administered 2017-08-17: 5 mg via ORAL
  Filled 2017-08-17: qty 1

## 2017-08-17 MED ORDER — DOCUSATE SODIUM 100 MG PO CAPS
100.0000 mg | ORAL_CAPSULE | Freq: Two times a day (BID) | ORAL | Status: DC
Start: 2017-08-17 — End: 2017-08-18
  Administered 2017-08-17 (×2): 100 mg via ORAL
  Filled 2017-08-17 (×3): qty 1

## 2017-08-17 MED ORDER — PHENYLEPHRINE 40 MCG/ML (10ML) SYRINGE FOR IV PUSH (FOR BLOOD PRESSURE SUPPORT)
PREFILLED_SYRINGE | INTRAVENOUS | Status: AC
  Filled 2017-08-17: qty 10

## 2017-08-17 MED ORDER — UMECLIDINIUM-VILANTEROL 62.5-25 MCG/INH IN AEPB
1.0000 | INHALATION_SPRAY | Freq: Every day | RESPIRATORY_TRACT | Status: DC
  Filled 2017-08-17: qty 14

## 2017-08-17 MED ORDER — GABAPENTIN 300 MG PO CAPS
300.0000 mg | ORAL_CAPSULE | Freq: Two times a day (BID) | ORAL | Status: DC
  Administered 2017-08-17 (×2): 300 mg via ORAL
  Filled 2017-08-17 (×3): qty 1

## 2017-08-17 MED ORDER — ACETAMINOPHEN 325 MG PO TABS: 650.0000 mg | ORAL_TABLET | ORAL | Status: DC | PRN

## 2017-08-17 MED ORDER — DOCUSATE SODIUM 100 MG PO CAPS: 100.0000 mg | ORAL_CAPSULE | Freq: Two times a day (BID) | ORAL | 2 refills | Status: DC

## 2017-08-17 MED ORDER — BELLADONNA-OPIUM 16.2-30 MG RE SUPP
RECTAL | Status: AC
  Filled 2017-08-17: qty 1

## 2017-08-17 MED ORDER — ZOLPIDEM TARTRATE 5 MG PO TABS: 5.0000 mg | ORAL_TABLET | Freq: Every evening | ORAL | Status: DC | PRN

## 2017-08-17 MED ORDER — ALBUTEROL SULFATE (2.5 MG/3ML) 0.083% IN NEBU
2.5000 mg | INHALATION_SOLUTION | RESPIRATORY_TRACT | Status: DC | PRN
Start: 2017-08-17 — End: 2017-08-17

## 2017-08-17 MED ORDER — LUBIPROSTONE 24 MCG PO CAPS
24.0000 ug | ORAL_CAPSULE | Freq: Two times a day (BID) | ORAL | Status: DC
  Administered 2017-08-17 (×2): 24 ug via ORAL
  Filled 2017-08-17 (×3): qty 1

## 2017-08-17 MED ORDER — LISINOPRIL-HYDROCHLOROTHIAZIDE 10-12.5 MG PO TABS: 1.0000 | ORAL_TABLET | Freq: Every day | ORAL | Status: DC

## 2017-08-17 MED ORDER — ALBUTEROL SULFATE (2.5 MG/3ML) 0.083% IN NEBU: 2.5000 mg | INHALATION_SOLUTION | RESPIRATORY_TRACT | Status: DC | PRN

## 2017-08-17 MED ORDER — METFORMIN HCL 500 MG PO TABS
500.0000 mg | ORAL_TABLET | Freq: Every day | ORAL | Status: DC
  Administered 2017-08-18: 500 mg via ORAL
  Filled 2017-08-17: qty 1

## 2017-08-17 MED ORDER — PROPOFOL 10 MG/ML IV BOLUS
INTRAVENOUS | Status: DC | PRN
  Administered 2017-08-17: 160 mg via INTRAVENOUS

## 2017-08-17 MED ORDER — FLUTICASONE-UMECLIDIN-VILANT 100-62.5-25 MCG/INH IN AEPB: 1.0000 | INHALATION_SPRAY | Freq: Every day | RESPIRATORY_TRACT | Status: DC

## 2017-08-17 MED ORDER — ONDANSETRON HCL 4 MG/2ML IJ SOLN
INTRAMUSCULAR | Status: DC | PRN
  Administered 2017-08-17: 4 mg via INTRAVENOUS

## 2017-08-17 MED ORDER — HYDROCHLOROTHIAZIDE 12.5 MG PO CAPS
12.5000 mg | ORAL_CAPSULE | Freq: Every day | ORAL | Status: DC
  Administered 2017-08-17: 12.5 mg via ORAL
  Filled 2017-08-17 (×2): qty 1

## 2017-08-17 MED ORDER — IPRATROPIUM-ALBUTEROL 0.5-2.5 (3) MG/3ML IN SOLN: 3.0000 mL | Freq: Four times a day (QID) | RESPIRATORY_TRACT | Status: DC | PRN

## 2017-08-17 MED ORDER — FENTANYL CITRATE (PF) 100 MCG/2ML IJ SOLN
INTRAMUSCULAR | Status: DC | PRN
  Administered 2017-08-17 (×2): 25 ug via INTRAVENOUS
  Administered 2017-08-17: 50 ug via INTRAVENOUS

## 2017-08-17 MED ORDER — BISACODYL 10 MG RE SUPP: 10.0000 mg | Freq: Every day | RECTAL | Status: DC | PRN

## 2017-08-17 MED ORDER — CEFAZOLIN SODIUM-DEXTROSE 2-4 GM/100ML-% IV SOLN
2.0000 g | INTRAVENOUS | Status: AC
  Administered 2017-08-17: 2 g via INTRAVENOUS
  Filled 2017-08-17: qty 100

## 2017-08-17 MED ORDER — LACTATED RINGERS IV SOLN
INTRAVENOUS | Status: DC
  Administered 2017-08-17: 09:00:00 via INTRAVENOUS

## 2017-08-17 MED ORDER — PROMETHAZINE HCL 25 MG/ML IJ SOLN: 6.2500 mg | INTRAMUSCULAR | Status: DC | PRN

## 2017-08-17 MED ORDER — MIDAZOLAM HCL 5 MG/5ML IJ SOLN
INTRAMUSCULAR | Status: DC | PRN
  Administered 2017-08-17: 2 mg via INTRAVENOUS

## 2017-08-17 MED ORDER — SENNOSIDES-DOCUSATE SODIUM 8.6-50 MG PO TABS: 1.0000 | ORAL_TABLET | Freq: Every evening | ORAL | Status: DC | PRN

## 2017-08-17 MED ORDER — ALBUTEROL SULFATE HFA 108 (90 BASE) MCG/ACT IN AERS: 1.0000 | INHALATION_SPRAY | RESPIRATORY_TRACT | Status: DC | PRN

## 2017-08-17 MED ORDER — LISINOPRIL 10 MG PO TABS
10.0000 mg | ORAL_TABLET | Freq: Every day | ORAL | Status: DC
  Administered 2017-08-17: 10 mg via ORAL
  Filled 2017-08-17 (×2): qty 1

## 2017-08-17 MED ORDER — SODIUM CHLORIDE 0.9 % IR SOLN
Status: DC | PRN
  Administered 2017-08-17: 9000 mL via INTRAVESICAL

## 2017-08-17 SURGICAL SUPPLY — 17 items
BAG URINE DRAINAGE (UROLOGICAL SUPPLIES) ×2 IMPLANT
BAG URO CATCHER STRL LF (MISCELLANEOUS) ×3 IMPLANT
CATH FOLEY 3WAY 30CC 22FR (CATHETERS) ×2 IMPLANT
COVER FOOTSWITCH UNIV (MISCELLANEOUS) ×2 IMPLANT
COVER SURGICAL LIGHT HANDLE (MISCELLANEOUS) ×3 IMPLANT
ELECT REM PT RETURN 15FT ADLT (MISCELLANEOUS) ×3 IMPLANT
GLOVE SURG SS PI 8.0 STRL IVOR (GLOVE) ×2 IMPLANT
GOWN STRL REUS W/TWL XL LVL3 (GOWN DISPOSABLE) ×3 IMPLANT
HOLDER FOLEY CATH W/STRAP (MISCELLANEOUS) ×2 IMPLANT
LOOP CUT BIPOLAR 24F LRG (ELECTROSURGICAL) ×2 IMPLANT
MANIFOLD NEPTUNE II (INSTRUMENTS) ×3 IMPLANT
PACK CYSTO (CUSTOM PROCEDURE TRAY) ×3 IMPLANT
SET ASPIRATION TUBING (TUBING) ×3 IMPLANT
SYR 30ML LL (SYRINGE) ×2 IMPLANT
SYRINGE IRR TOOMEY STRL 70CC (SYRINGE) ×2 IMPLANT
TUBING CONNECTING 10 (TUBING) ×2 IMPLANT
TUBING CONNECTING 10' (TUBING) ×1

## 2017-08-17 NOTE — Anesthesia Postprocedure Evaluation (Signed)
Anesthesia Post Note  Patient: Bryan [name]  Procedure(s) Performed: TRANSURETHRAL RESECTION OF THE PROSTATE (TURP) (N/A Prostate)     Patient location during evaluation: PACU Anesthesia Type: General Level of consciousness: sedated Pain management: pain level controlled Vital Signs Assessment: post-procedure vital signs reviewed and stable Respiratory status: spontaneous breathing and respiratory function stable Cardiovascular status: stable Postop Assessment: no apparent nausea or vomiting Anesthetic complications: no    Last Vitals:  Vitals:   08/17/17 1200 08/17/17 1215  BP: (!) 150/94 (!) 145/87  Pulse: 78 70  Resp: 20 18  Temp: 36.4 C 36.8 C  SpO2: 98% 97%    Last Pain:  Vitals:   08/17/17 1200  TempSrc:   PainSc: Asleep                 Howard Patton DANIEL

## 2017-08-17 NOTE — Op Note (Signed)
Preoperative diagnosis: 1. Bladder outlet obstruction secondary to BPH  Postoperative diagnosis:  1. Bladder outlet obstruction secondary to BPH  Procedure:  1. Cystoscopy 2. Transurethral resection of the prostate  Surgeon: Irine Seal. M.D.  Anesthesia: general  Complications: None  EBL: minimal   Specimens: 1. Prostate chips  Disposition of specimens: Pathology  Indication: Bryan [name] is a patient with bladder outlet obstruction secondary to benign prostatic hyperplasia. After reviewing the management options for treatment, he elected to proceed with the above surgical procedure(s). We have discussed the potential benefits and risks of the procedure, side effects of the proposed treatment, the likelihood of the patient achieving the goals of the procedure, and any potential problems that might occur during the procedure or recuperation. Informed consent has been obtained.  Description of procedure:  The patient was taken to the operating room and general  anesthesia was induced.  The patient was placed in the dorsal lithotomy position, prepped and draped in the usual sterile fashion, and preoperative antibiotics were administered. A preoperative time-out was performed.   Cystourethroscopy was performed.  The patient's urethra was examined and was normal.  External sphincter was intact.  He had bilobar prostatic hypertrophy with a small middle lobe and obstruction.   The bladder was then systematically examined in its entirety. There was mild trabeculation with no evidence of any bladder tumors, stones, or other mucosal pathology.  The ureteral orifices were identified and marked so as to be avoided during the procedure.  The prostate adenoma was then resected utilizing loop cautery resection with the bipolar cutting loop.  The prostate adenoma from the bladder neck back to the verumontanum was resected beginning at the six o'clock position and then extended to include the right  and left lobes of the prostate and anterior prostate. Care was taken not to resect distal to the verumontanum.  At the completion of the procedure the bladder was evacuated free of chips and hemostasis was insured.  Final inspection revealed intact ureteral orifices, a widely patent TUR channel and an intact external sphincter.   Hemostasis was then achieved with the cautery and the bladder was emptied and reinspected with no significant bleeding noted at the end of the procedure.    A 67f 3 way catheter was then placed into the bladder and placed on continuous bladder irrigation.  The patient appeared to tolerate the procedure well and without complications.  The patient was able to be awakened and transferred to the recovery unit in satisfactory condition.

## 2017-08-17 NOTE — Transfer of Care (Signed)
Immediate Anesthesia Transfer of Care Note  Patient: Bryan [name]  Procedure(s) Performed: TRANSURETHRAL RESECTION OF THE PROSTATE (TURP) (N/A Prostate)  Patient Location: PACU  Anesthesia Type:General  Level of Consciousness: sedated  Airway & Oxygen Therapy: Patient Spontanous Breathing and Patient connected to face mask oxygen  Post-op Assessment: Report given to RN and Post -op Vital signs reviewed and stable  Post vital signs: Reviewed and stable  Last Vitals:  Vitals:   08/17/17 0814  BP: 130/80  Pulse: 80  Resp: 16  Temp: 36.9 C  SpO2: 96%    Last Pain:  Vitals:   08/17/17 0814  TempSrc: Oral      Patients Stated Pain Goal: 4 (99/87/21 5872)  Complications: No apparent anesthesia complications

## 2017-08-17 NOTE — Anesthesia Procedure Notes (Signed)
Procedure Name: LMA Insertion Date/Time: 08/17/2017 10:33 AM Performed by: Aneesha Holloran D, CRNA Pre-anesthesia Checklist: Patient identified, Emergency Drugs available, Suction available and Patient being monitored Patient Re-evaluated:Patient Re-evaluated prior to induction Oxygen Delivery Method: Circle system utilized Preoxygenation: Pre-oxygenation with 100% oxygen Induction Type: IV induction Ventilation: Mask ventilation without difficulty LMA: LMA inserted LMA Size: 4.0 Tube type: Oral Number of attempts: 1 Placement Confirmation: positive ETCO2 and breath sounds checked- equal and bilateral Tube secured with: Tape Dental Injury: Teeth and Oropharynx as per pre-operative assessment

## 2017-08-17 NOTE — Anesthesia Preprocedure Evaluation (Signed)
Anesthesia Evaluation  Patient identified by MRN, date of birth, ID band Patient awake    Reviewed: Allergy & Precautions, NPO status , Patient's Chart, lab work & pertinent test results  Airway Mallampati: II  TM Distance: >3 FB     Dental  (+) Poor Dentition, Missing   Pulmonary asthma , COPD, former smoker,    breath sounds clear to auscultation       Cardiovascular hypertension, Pt. on medications  Rhythm:Regular Rate:Normal  Study Conclusions  - Left ventricle: The cavity size was normal. Wall thickness was   normal. Systolic function was normal. The estimated ejection   fraction was in the range of 60% to 65%.    Neuro/Psych TIAnegative psych ROS   GI/Hepatic GERD  ,(+)     substance abuse  alcohol use,   Endo/Other    Renal/GU Renal disease     Musculoskeletal   Abdominal   Peds  Hematology   Anesthesia Other Findings   Reproductive/Obstetrics                             Anesthesia Physical  Anesthesia Plan  ASA: III  Anesthesia Plan: General   Post-op Pain Management:    Induction: Intravenous  PONV Risk Score and Plan: 2 and Ondansetron and Dexamethasone  Airway Management Planned: LMA and Oral ETT  Additional Equipment:   Intra-op Plan:   Post-operative Plan: Extubation in OR  Informed Consent: I have reviewed the patients History and Physical, chart, labs and discussed the procedure including the risks, benefits and alternatives for the proposed anesthesia with the patient or authorized representative who has indicated his/her understanding and acceptance.   Dental advisory given  Plan Discussed with: CRNA and Anesthesiologist  Anesthesia Plan Comments:         Anesthesia Quick Evaluation

## 2017-08-17 NOTE — Interval H&P Note (Signed)
History and Physical Interval Note:  08/17/2017 9:51 AM  Bryan [name]  has presented today for surgery, with the diagnosis of BENIGN PROSTATE HYPERPLASIA WITH BLADDER OUTLET OBSTRUCTION  The various methods of treatment have been discussed with the patient and family. After consideration of risks, benefits and other options for treatment, the patient has consented to  Procedure(s): TRANSURETHRAL RESECTION OF THE PROSTATE (TURP) (N/A) as a surgical intervention .  The patient's history has been reviewed, patient examined, no change in status, stable for surgery.  I have reviewed the patient's chart and labs.  Questions were answered to the patient's satisfaction.     Irine Seal

## 2017-08-17 NOTE — Discharge Instructions (Signed)
Transurethral Resection of the Prostate, Care After Refer to this sheet in the next few weeks. These instructions provide you with information about caring for yourself after your procedure. Your health care provider may also give you more specific instructions. Your treatment has been planned according to current medical practices, but problems sometimes occur. Call your health care provider if you have any problems or questions after your procedure. What can I expect after the procedure? After the procedure, it is common to have:  Mild pain in your lower abdomen.  Soreness or mild discomfort in your penis from having the catheter inserted during the procedure.  A feeling of urgency when you need to urinate.  A small amount of blood in your urine. You may notice some small blood clots in your urine. These are normal.  Follow these instructions at home: Medicines   Take over-the-counter and prescription medicines only as told by your health care provider.  Do not drive or operate heavy machinery while taking prescription pain medicine.  Do not drive for 24 hours if you received a sedative.  If you were prescribed antibiotic medicine, take it as told by your health care provider. Do not stop taking the antibiotic even if you start to feel better. Activity  Return to your normal activities as told by your health care provider. Ask your health care provider what activities are safe for you.  Do not lift anything that is heavier than 10 lb (4.5 kg) for 3 weeks after your procedure, or as long as told by your health care provider.  Avoid intense physical activity for as long as told by your health care provider.  Walk at least one time every day. This helps to prevent blood clots. You may increase your physical activity gradually as you start to feel better. Lifestyle  Do not drink alcohol for as long as told by your health care provider. This is especially important if you are taking  prescription pain medicines.  Do not engage in sexual activity until your health care provider says that you can do this. General instructions  Do not take baths, swim, or use a hot tub until your health care provider approves.  Drink enough fluid to keep your urine clear or pale yellow.  Urinate as soon as you feel the need to. Do not try to hold your urine for long periods of time.  If your health care provider approves, you may take a stool softener for 2-3 weeks to prevent you from straining to have a bowel movement.  Wear compression stockings as told by your health care provider. These stockings help to prevent blood clots and reduce swelling in your legs.  Keep all follow-up visits as told by your health care provider. This is important. Contact a health care provider if:  You have difficulty urinating.  You have a fever.  You have pain that gets worse or does not improve with medicine.  You have blood in your urine that does not go away after 1 week of resting and drinking more fluids.  You have swelling in your penis or testicles. Get help right away if:  You are unable to urinate.  You are having more blood clots in your urine instead of fewer.  You have: ? Large blood clots. ? A lot of blood in your urine. ? Pain in your back or lower abdomen. ? Pain or swelling in your legs. ? Chills and you are shaking. This information is not intended to  replace advice given to you by your health care provider. Make sure you discuss any questions you have with your health care provider. Document Released: 08/29/2005 Document Revised: 05/01/2016 Document Reviewed: 05/21/2015 Elsevier Interactive Patient Education  2017 [place] resume aspirin until 1 week from surgery.

## 2017-08-17 NOTE — Progress Notes (Signed)
Patient ID: Bryan [name], male   DOB: [birth year], 61 y.o.   MRN: [id number] He is doing well post op with a clear urine.  He does have a toothache on the left.   .BP (!) 145/87   Pulse 70   Temp 98.2 F (36.8 C)   Resp 18   Ht _0  (1.6 m)   Wt 53.1 kg (117 lb)   SpO2 97%   BMI 20.73 kg/m .  Plan for voiding trial and D/C in AM.

## 2017-08-18 DIAGNOSIS — N401 Enlarged prostate with lower urinary tract symptoms: Secondary | ICD-10-CM | POA: Diagnosis not present

## 2017-08-18 DIAGNOSIS — R3912 Poor urinary stream: Secondary | ICD-10-CM | POA: Diagnosis not present

## 2017-08-18 DIAGNOSIS — N138 Other obstructive and reflux uropathy: Secondary | ICD-10-CM | POA: Diagnosis not present

## 2017-08-18 DIAGNOSIS — R351 Nocturia: Secondary | ICD-10-CM | POA: Diagnosis not present

## 2017-08-18 DIAGNOSIS — Z79899 Other long term (current) drug therapy: Secondary | ICD-10-CM | POA: Diagnosis not present

## 2017-08-18 DIAGNOSIS — R350 Frequency of micturition: Secondary | ICD-10-CM | POA: Diagnosis not present

## 2017-08-18 LAB — GLUCOSE, CAPILLARY
Glucose-Capillary: 93 mg/dL (ref 65–99)
Glucose-Capillary: 86 mg/dL (ref 65–99)

## 2017-08-18 NOTE — Care Management Obs Status (Signed)
[facility] NOTIFICATION   Patient Details  Name: Bryan [name] MRN: [id number] Date of Birth: 13-May-1956   Medicare Observation Status Notification Given:  Yes    Purcell Mouton, RN 08/18/2017, 10:49 AM

## 2017-08-18 NOTE — Progress Notes (Signed)
Patient discharged home with family,  Discharge instructions/prescription given and explained to patient and he verbalized understanding, denies any pain/distress, skin intact, no wound noted. Accompanied home by brother.

## 2017-08-18 NOTE — Discharge Summary (Signed)
Physician Discharge Summary  Patient ID: Bryan [name] MRN: [id number] DOB/AGE: 61-Sep-1957 61 y.o.  Admit date: 08/17/2017 Discharge date: 08/18/2017  Admission Diagnoses:  Discharge Diagnoses:  Active Problems:   BPH with obstruction/lower urinary tract symptoms   Discharged Condition: good  Hospital Course: Underwent TURP yesterday. Passed void trial today with light red urine.  Consults: None  Significant Diagnostic Studies: none  Treatments: surgery: TURP  Discharge Exam: Blood pressure 131/79, pulse 84, temperature 98.9 F (37.2 C), temperature source Oral, resp. rate 18, height _0  (1.6 m), weight 53.1 kg (117 lb), SpO2 94 %. General appearance: alert  Urine light red in urinal  Disposition: 01-Home or Self Care   Allergies as of 08/18/2017   No Known Allergies     Medication List    TAKE these medications   albuterol (2.5 MG/3ML) 0.083% nebulizer solution Commonly known as:  PROVENTIL Take 3 mLs (2.5 mg total) every 4 (four) hours as needed by nebulization for wheezing or shortness of breath. What changed:  Another medication with the same name was changed. Make sure you understand how and when to take each.   albuterol 108 (90 Base) MCG/ACT inhaler Commonly known as:  PROVENTIL HFA;VENTOLIN HFA Inhale 1-2 puffs every 4 (four) hours as needed into the lungs for wheezing or shortness of breath. Do not use with nebulizer What changed:    how much to take  additional instructions   AMITIZA 24 MCG capsule Generic drug:  lubiprostone Take 24 mcg by mouth 2 (two) times daily.   aspirin 325 MG tablet Take 1 tablet (325 mg total) daily by mouth.   atorvastatin 40 MG tablet Commonly known as:  LIPITOR Take 1 tablet (40 mg total) daily by mouth.   benzonatate 100 MG capsule Commonly known as:  TESSALON Take 1 capsule (100 mg total) 3 (three) times daily as needed by mouth for cough.   blood glucose meter kit and supplies Dispense based on patient and  insurance preference. Use up to four times daily as directed. (FOR ICD-9 250.00, 250.01).   docusate sodium 100 MG capsule Commonly known as:  COLACE Take 1 capsule (100 mg total) by mouth 2 (two) times daily.   gabapentin 300 MG capsule Commonly known as:  NEURONTIN Take 300 mg by mouth 2 (two) times daily.   ipratropium-albuterol 0.5-2.5 (3) MG/3ML Soln Commonly known as:  DUONEB Take 3 mLs every 6 (six) hours as needed by nebulization (wheezing).   lisinopril-hydrochlorothiazide 10-12.5 MG tablet Commonly known as:  PRINZIDE,ZESTORETIC Take 1 tablet by mouth daily.   meloxicam 7.5 MG tablet Commonly known as:  MOBIC Take 7.5 mg by mouth daily.   metFORMIN 500 MG tablet Commonly known as:  GLUCOPHAGE Take 1 tablet (500 mg total) daily with breakfast by mouth.   pantoprazole 40 MG tablet Commonly known as:  PROTONIX Take 1 tablet (40 mg total) by mouth daily before breakfast.   TRELEGY ELLIPTA 100-62.5-25 MCG/INH Aepb Generic drug:  Fluticasone-Umeclidin-Vilant Inhale 1 puff into the lungs daily.      Follow-up Information    Irine Seal, MD On 09/08/2017.   Specialty:  Urology Why:  338 Contact information: [address] [place] [address] [phone]           Signed: Marton Redwood, III 08/18/2017, 8:18 AM

## 2017-09-01 ENCOUNTER — Other Ambulatory Visit: Payer: Self-pay

## 2017-09-01 NOTE — Patient Outreach (Signed)
[facility]) Care Management  Bryan [name]  09/01/2017   Bryan [name] [birth year] [id number]  Subjective: Telephone call placed to the patient for monthly assessment.  HIPAA verified.  The patient states that he is doing well.  He denies any  pain or falls. The patient states that his blood sugars are ranging between 90-105.  He has not checked his blood sugar this morning. The patient states that he is adherent with his medications but said that he has ran out of his metformin.  He states that he called the physician office for refill on Monday but has not had a response. RN Health Coach advised the patient to call the office back so he will not go without his medication over the holidays. RN Health Coach discussed diet. The patient verbalized understanding.  He is being active walking.  The patient has an appointment with his primary care physician on 09/08/2017.  Objective:   Encounter Medications:  Outpatient Encounter Medications as of 09/01/2017  Medication Sig  . albuterol (PROVENTIL HFA;VENTOLIN HFA) 108 (90 Base) MCG/ACT inhaler Inhale 1-2 puffs every 4 (four) hours as needed into the lungs for wheezing or shortness of breath. Do not use with nebulizer (Patient taking differently: Inhale 1 puff into the lungs every 4 (four) hours as needed for wheezing or shortness of breath. Do not use with nebulizer)  . albuterol (PROVENTIL) (2.5 MG/3ML) 0.083% nebulizer solution Take 3 mLs (2.5 mg total) every 4 (four) hours as needed by nebulization for wheezing or shortness of breath.  . AMITIZA 24 MCG capsule Take 24 mcg by mouth 2 (two) times daily.   Marland Kitchen aspirin 325 MG tablet Take 1 tablet (325 mg total) daily by mouth.  Marland Kitchen atorvastatin (LIPITOR) 40 MG tablet Take 1 tablet (40 mg total) daily by mouth.  . benzonatate (TESSALON) 100 MG capsule Take 1 capsule (100 mg total) 3 (three) times daily as needed by mouth for cough.  . blood glucose meter kit and supplies Dispense based  on patient and insurance preference. Use up to four times daily as directed. (FOR ICD-9 250.00, 250.01).  Marland Kitchen docusate sodium (COLACE) 100 MG capsule Take 1 capsule (100 mg total) by mouth 2 (two) times daily.  Marland Kitchen gabapentin (NEURONTIN) 300 MG capsule Take 300 mg by mouth 2 (two) times daily.  Marland Kitchen ipratropium-albuterol (DUONEB) 0.5-2.5 (3) MG/3ML SOLN Take 3 mLs every 6 (six) hours as needed by nebulization (wheezing).  Marland Kitchen lisinopril-hydrochlorothiazide (PRINZIDE,ZESTORETIC) 10-12.5 MG tablet Take 1 tablet by mouth daily.  . meloxicam (MOBIC) 7.5 MG tablet Take 7.5 mg by mouth daily.   . metFORMIN (GLUCOPHAGE) 500 MG tablet Take 1 tablet (500 mg total) daily with breakfast by mouth.  . pantoprazole (PROTONIX) 40 MG tablet Take 1 tablet (40 mg total) by mouth daily before breakfast.  . TRELEGY ELLIPTA 100-62.5-25 MCG/INH AEPB Inhale 1 puff into the lungs daily.    No facility-administered encounter medications on file as of 09/01/2017.     Functional Status:  In your present state of health, do you have any difficulty performing the following activities: 08/17/2017 08/17/2017  Hearing? - N  Vision? - N  Comment - -  Difficulty concentrating or making decisions? - N  Walking or climbing stairs? - N  Comment - -  Dressing or bathing? - N  Doing errands, shopping? N -  Comment - -  Some recent data might be hidden    Fall/Depression Screening: Fall Risk  09/01/2017 08/09/2017  Falls in the  past year? No No   PHQ 2/9 Scores 08/09/2017  PHQ - 2 Score 0    Assessment: Patient continues to benefit from health coach outreach for disease management and support.   THN CM Care Plan Problem One     Most Recent Value  Role Documenting the Problem One  [place] for Problem One  Active  THN Long Term Goal   in 90 days the patient will verbalize that his a1c has not risen above 6.5  THN Long Term Goal Start Date  08/09/17  Interventions for Problem One [place]  discussed with the patient about his diet and hydration and how it can affect his blood sugars.  THN CM Short Term Goal #1   in 30 days the patient will verbalize that he is checking his blood sugars daily.  THN CM Short Term Goal #1 Start Date  08/09/17  Interventions for Short Term Goal #1  RN Health Coach encouraged the patient to  checki his blood sugars daily  THN CM Short Term Goal #2   In 30 days the patient verbalize two foods that can affect his blood sugars   THN CM Short Term Goal #2 Start Date  08/09/17  Interventions for Short Term Goal #2  RN Health Coach discussed diet with the patient.     Plan: RN Health Coach will contact patient in the month of January and patient agrees to next outreach.  [name] RN, BSN, [place] Direct Dial:  [phone] Fax: [phone]

## 2017-09-08 ENCOUNTER — Other Ambulatory Visit (HOSPITAL_COMMUNITY)
Admission: RE | Admit: 2017-09-08 | Discharge: 2017-09-08 | Disposition: A | Discharge status: Home / Self Care | Payer: Medicare Other | Source: Other Acute Inpatient Hospital | Attending: Urology | Admitting: Urology

## 2017-09-08 ENCOUNTER — Ambulatory Visit (INDEPENDENT_AMBULATORY_CARE_PROVIDER_SITE_OTHER): Payer: Medicare Other | Admitting: Urology

## 2017-09-08 DIAGNOSIS — K219 Gastro-esophageal reflux disease without esophagitis: Secondary | ICD-10-CM | POA: Diagnosis not present

## 2017-09-08 DIAGNOSIS — N3941 Urge incontinence: Secondary | ICD-10-CM

## 2017-09-08 DIAGNOSIS — J449 Chronic obstructive pulmonary disease, unspecified: Secondary | ICD-10-CM | POA: Diagnosis not present

## 2017-09-08 DIAGNOSIS — I10 Essential (primary) hypertension: Secondary | ICD-10-CM | POA: Diagnosis not present

## 2017-09-08 DIAGNOSIS — N401 Enlarged prostate with lower urinary tract symptoms: Secondary | ICD-10-CM | POA: Diagnosis not present

## 2017-09-08 DIAGNOSIS — R3914 Feeling of incomplete bladder emptying: Secondary | ICD-10-CM

## 2017-09-08 DIAGNOSIS — R351 Nocturia: Secondary | ICD-10-CM

## 2017-09-09 LAB — URINE CULTURE: Culture: NO GROWTH

## 2017-09-15 DIAGNOSIS — R0602 Shortness of breath: Secondary | ICD-10-CM | POA: Diagnosis not present

## 2017-09-15 DIAGNOSIS — F17200 Nicotine dependence, unspecified, uncomplicated: Secondary | ICD-10-CM | POA: Diagnosis not present

## 2017-09-15 DIAGNOSIS — J449 Chronic obstructive pulmonary disease, unspecified: Secondary | ICD-10-CM | POA: Diagnosis not present

## 2017-09-15 DIAGNOSIS — N401 Enlarged prostate with lower urinary tract symptoms: Secondary | ICD-10-CM | POA: Diagnosis not present

## 2017-09-15 DIAGNOSIS — K219 Gastro-esophageal reflux disease without esophagitis: Secondary | ICD-10-CM | POA: Diagnosis not present

## 2017-09-15 DIAGNOSIS — I10 Essential (primary) hypertension: Secondary | ICD-10-CM | POA: Diagnosis not present

## 2017-09-15 DIAGNOSIS — J441 Chronic obstructive pulmonary disease with (acute) exacerbation: Secondary | ICD-10-CM | POA: Diagnosis not present

## 2017-09-15 DIAGNOSIS — J4520 Mild intermittent asthma, uncomplicated: Secondary | ICD-10-CM | POA: Diagnosis not present

## 2017-09-15 DIAGNOSIS — R7302 Impaired glucose tolerance (oral): Secondary | ICD-10-CM | POA: Diagnosis not present

## 2017-09-29 DIAGNOSIS — K210 Gastro-esophageal reflux disease with esophagitis: Secondary | ICD-10-CM | POA: Diagnosis not present

## 2017-09-29 DIAGNOSIS — J449 Chronic obstructive pulmonary disease, unspecified: Secondary | ICD-10-CM | POA: Diagnosis not present

## 2017-09-29 DIAGNOSIS — E46 Unspecified protein-calorie malnutrition: Secondary | ICD-10-CM | POA: Diagnosis not present

## 2017-09-29 DIAGNOSIS — J9611 Chronic respiratory failure with hypoxia: Secondary | ICD-10-CM | POA: Diagnosis not present

## 2017-10-03 ENCOUNTER — Other Ambulatory Visit: Payer: Self-pay

## 2017-10-03 NOTE — Patient Outreach (Signed)
[place] [facility]) Care Management  10/03/2017  Bryan [name] 02/13/1956 [id number]   Telephone call to patient for monthly outreach.  HIPAA compliant voice message left with contact information.  Plan: RN Health Coach will make an outreach attempt to the patient within the month of January.  [name] RN, BSN, [place] Direct Dial:  [phone] Fax: [phone]

## 2017-10-06 ENCOUNTER — Other Ambulatory Visit (HOSPITAL_COMMUNITY)
Admission: AD | Admit: 2017-10-06 | Discharge: 2017-10-06 | Disposition: A | Discharge status: Home / Self Care | Payer: Medicare HMO | Source: Other Acute Inpatient Hospital | Attending: Urology | Admitting: Urology

## 2017-10-06 ENCOUNTER — Ambulatory Visit (INDEPENDENT_AMBULATORY_CARE_PROVIDER_SITE_OTHER): Payer: Self-pay | Admitting: Urology

## 2017-10-06 DIAGNOSIS — R300 Dysuria: Secondary | ICD-10-CM | POA: Diagnosis not present

## 2017-10-06 DIAGNOSIS — R350 Frequency of micturition: Secondary | ICD-10-CM

## 2017-10-06 DIAGNOSIS — N401 Enlarged prostate with lower urinary tract symptoms: Secondary | ICD-10-CM

## 2017-10-06 DIAGNOSIS — R351 Nocturia: Secondary | ICD-10-CM

## 2017-10-06 LAB — URINALYSIS, COMPLETE (UACMP) WITH MICROSCOPIC
BILIRUBIN URINE: NEGATIVE
Bacteria, UA: NONE SEEN
GLUCOSE, UA: NEGATIVE mg/dL
HGB URINE DIPSTICK: NEGATIVE
Ketones, ur: NEGATIVE mg/dL
Nitrite: NEGATIVE
PROTEIN: NEGATIVE mg/dL
Specific Gravity, Urine: 1.004 — ABNORMAL LOW (ref 1.005–1.030)
Squamous Epithelial / LPF: NONE SEEN
pH: 6 (ref 5.0–8.0)

## 2017-10-08 LAB — URINE CULTURE: CULTURE: NO GROWTH

## 2017-10-12 ENCOUNTER — Other Ambulatory Visit: Payer: Self-pay

## 2017-10-12 NOTE — Patient Outreach (Signed)
[facility]) Care Management  [place]  10/12/2017   Bryan [name] 16-Oct-1955 [id number]  Subjective: Telephone call to the patient for a monthly assessment.  HIPAA verified. The patient states he is doing well.  He has been having some chronic pain in his legs that he rates at 8/10. He states his medication helps relieve some of the pain.  He is adherent with his meds. The patient had not checked his blood sugar yet this morning but stated that yesterday it was 88.  He said that it has been running between 88 and 92. The patient states that he has been aware of his diet but has put on about 3 lbs.  He is exercising by stretching daily.  He states it helps with the pain in his legs. The patient will follow up with his primary care November 06, 2017.  Objective:   Encounter Medications:  Outpatient Encounter Medications as of 10/12/2017  Medication Sig  . albuterol (PROVENTIL HFA;VENTOLIN HFA) 108 (90 Base) MCG/ACT inhaler Inhale 1-2 puffs every 4 (four) hours as needed into the lungs for wheezing or shortness of breath. Do not use with nebulizer (Patient taking differently: Inhale 1 puff into the lungs every 4 (four) hours as needed for wheezing or shortness of breath. Do not use with nebulizer)  . albuterol (PROVENTIL) (2.5 MG/3ML) 0.083% nebulizer solution Take 3 mLs (2.5 mg total) every 4 (four) hours as needed by nebulization for wheezing or shortness of breath.  . AMITIZA 24 MCG capsule Take 24 mcg by mouth 2 (two) times daily.   Marland Kitchen aspirin 325 MG tablet Take 1 tablet (325 mg total) daily by mouth.  Marland Kitchen atorvastatin (LIPITOR) 40 MG tablet Take 1 tablet (40 mg total) daily by mouth.  . benzonatate (TESSALON) 100 MG capsule Take 1 capsule (100 mg total) 3 (three) times daily as needed by mouth for cough.  . docusate sodium (COLACE) 100 MG capsule Take 1 capsule (100 mg total) by mouth 2 (two) times daily.  Marland Kitchen gabapentin (NEURONTIN) 300 MG capsule Take 300 mg by mouth 2  (two) times daily.  Marland Kitchen ipratropium-albuterol (DUONEB) 0.5-2.5 (3) MG/3ML SOLN Take 3 mLs every 6 (six) hours as needed by nebulization (wheezing).  Marland Kitchen lisinopril-hydrochlorothiazide (PRINZIDE,ZESTORETIC) 10-12.5 MG tablet Take 1 tablet by mouth daily.  . meloxicam (MOBIC) 7.5 MG tablet Take 7.5 mg by mouth daily.   . metFORMIN (GLUCOPHAGE) 500 MG tablet Take 1 tablet (500 mg total) daily with breakfast by mouth.  . pantoprazole (PROTONIX) 40 MG tablet Take 1 tablet (40 mg total) by mouth daily before breakfast.  . TRELEGY ELLIPTA 100-62.5-25 MCG/INH AEPB Inhale 1 puff into the lungs daily.   . blood glucose meter kit and supplies Dispense based on patient and insurance preference. Use up to four times daily as directed. (FOR ICD-9 250.00, 250.01).   No facility-administered encounter medications on file as of 10/12/2017.     Functional Status:  In your present state of health, do you have any difficulty performing the following activities: 08/17/2017 08/17/2017  Hearing? - N  Vision? - N  Comment - -  Difficulty concentrating or making decisions? - N  Walking or climbing stairs? - N  Comment - -  Dressing or bathing? - N  Doing errands, shopping? N -  Comment - -  Some recent data might be hidden    Fall/Depression Screening: Fall Risk  10/12/2017 09/01/2017 08/09/2017  Falls in the past year? No No No   PHQ  2/9 Scores 08/09/2017  PHQ - 2 Score 0    Assessment: Patient continues to benefit from health coach outreach for disease management and support.    THN CM Care Plan Problem One     Most Recent Value  Care Plan for Problem One  Active  THN Long Term Goal   in 90 days the patient will verbalize that his a1c has not risen above 6.5  THN Long Term Goal Start Date  10/12/17  Interventions for Problem One [place] talked with the patient about his diet and he seems to be proactive about his health  [facility] CM Short Term Goal #1 Start Date  10/12/17  [facility] CM Short  Term Goal #1 Met Date  10/12/17  Interventions for Short Term Goal #1  RN Health Coach spoke with the patient and he states that he is checking his blood sugars regulary  THN CM Short Term Goal #2   In 30 days the patient verbalize two foods that can affect his blood sugars   THN CM Short Term Goal #2 Start Date  10/12/17  [facility] CM Short Term Goal #2 Met Date  10/12/17  Interventions for Short Term Goal #2  RN Health coach aked the patient about his diet and he states that he is watching his carbs     Plan: [place] will contact patient in the month of February and patient agrees to next outreach.  [name] RN, BSN, [place] Direct Dial:  [phone] Fax: [phone]

## 2017-10-16 DIAGNOSIS — J449 Chronic obstructive pulmonary disease, unspecified: Secondary | ICD-10-CM | POA: Diagnosis not present

## 2017-10-16 DIAGNOSIS — J4520 Mild intermittent asthma, uncomplicated: Secondary | ICD-10-CM | POA: Diagnosis not present

## 2017-10-16 DIAGNOSIS — R0602 Shortness of breath: Secondary | ICD-10-CM | POA: Diagnosis not present

## 2017-10-16 DIAGNOSIS — J441 Chronic obstructive pulmonary disease with (acute) exacerbation: Secondary | ICD-10-CM | POA: Diagnosis not present

## 2017-11-09 ENCOUNTER — Other Ambulatory Visit: Payer: Self-pay

## 2017-11-09 NOTE — Patient Outreach (Addendum)
[facility]) Care Management  [place]  11/09/2017   DAYN BARICH 03/28/1956 [id number]  Subjective: Telephone to the patient for monthly assessment. HIPAA verified.  The patient states that he is doing well.  He denies any pain or falls.  He states that he is adherent with his medication.  The patient states that he checks his blood sugars every other day.  His blood sugars have been running between 84-102.  He is watching his food intake and exercising.  RN Health Coach encouraged the patient to continue his efforts to take control of his health.  The patient has an appointment in March to have his a1c rechecked at that time we will revisit his goals and decide if he will discontinue his service.    Encounter Medications:  Outpatient Encounter Medications as of 11/09/2017  Medication Sig  . albuterol (PROVENTIL HFA;VENTOLIN HFA) 108 (90 Base) MCG/ACT inhaler Inhale 1-2 puffs every 4 (four) hours as needed into the lungs for wheezing or shortness of breath. Do not use with nebulizer (Patient taking differently: Inhale 1 puff into the lungs every 4 (four) hours as needed for wheezing or shortness of breath. Do not use with nebulizer)  . albuterol (PROVENTIL) (2.5 MG/3ML) 0.083% nebulizer solution Take 3 mLs (2.5 mg total) every 4 (four) hours as needed by nebulization for wheezing or shortness of breath.  . AMITIZA 24 MCG capsule Take 24 mcg by mouth 2 (two) times daily.   Marland Kitchen aspirin 325 MG tablet Take 1 tablet (325 mg total) daily by mouth.  Marland Kitchen atorvastatin (LIPITOR) 40 MG tablet Take 1 tablet (40 mg total) daily by mouth.  . benzonatate (TESSALON) 100 MG capsule Take 1 capsule (100 mg total) 3 (three) times daily as needed by mouth for cough.  . blood glucose meter kit and supplies Dispense based on patient and insurance preference. Use up to four times daily as directed. (FOR ICD-9 250.00, 250.01).  Marland Kitchen docusate sodium (COLACE) 100 MG capsule Take 1 capsule (100 mg total)  by mouth 2 (two) times daily.  Marland Kitchen gabapentin (NEURONTIN) 300 MG capsule Take 300 mg by mouth 2 (two) times daily.  Marland Kitchen ipratropium-albuterol (DUONEB) 0.5-2.5 (3) MG/3ML SOLN Take 3 mLs every 6 (six) hours as needed by nebulization (wheezing).  Marland Kitchen lisinopril-hydrochlorothiazide (PRINZIDE,ZESTORETIC) 10-12.5 MG tablet Take 1 tablet by mouth daily.  . meloxicam (MOBIC) 7.5 MG tablet Take 7.5 mg by mouth daily.   . metFORMIN (GLUCOPHAGE) 500 MG tablet Take 1 tablet (500 mg total) daily with breakfast by mouth.  . pantoprazole (PROTONIX) 40 MG tablet Take 1 tablet (40 mg total) by mouth daily before breakfast.  . TRELEGY ELLIPTA 100-62.5-25 MCG/INH AEPB Inhale 1 puff into the lungs daily.    No facility-administered encounter medications on file as of 11/09/2017.     Functional Status:  In your present state of health, do you have any difficulty performing the following activities: 08/17/2017 08/17/2017  Hearing? - N  Vision? - N  Comment - -  Difficulty concentrating or making decisions? - N  Walking or climbing stairs? - N  Comment - -  Dressing or bathing? - N  Doing errands, shopping? N -  Comment - -  Some recent data might be hidden    Fall/Depression Screening: Fall Risk  11/09/2017 10/12/2017 09/01/2017  Falls in the past year? No No No   PHQ 2/9 Scores 08/09/2017  PHQ - 2 Score 0    Assessment: Patient continues to benefit from health  coach outreach for disease management and support.    THN CM Care Plan Problem One     Most Recent Value  THN Long Term Goal   in 90 days the patient will verbalize that his a1c has not risen above 6.5  THN Long Term Goal Start Date  11/09/17  Interventions for Problem One [place] reinterated to the paitent about his diet and exercise.                 Plan: RN Health Coach will contact patient in the month of March and patient agrees to next outreach.  [name] RN, BSN, [place] Direct Dial:  [phone] Fax: [phone]

## 2017-11-13 DIAGNOSIS — J4520 Mild intermittent asthma, uncomplicated: Secondary | ICD-10-CM | POA: Diagnosis not present

## 2017-11-13 DIAGNOSIS — R0602 Shortness of breath: Secondary | ICD-10-CM | POA: Diagnosis not present

## 2017-11-13 DIAGNOSIS — J441 Chronic obstructive pulmonary disease with (acute) exacerbation: Secondary | ICD-10-CM | POA: Diagnosis not present

## 2017-11-13 DIAGNOSIS — J449 Chronic obstructive pulmonary disease, unspecified: Secondary | ICD-10-CM | POA: Diagnosis not present

## 2017-11-17 ENCOUNTER — Ambulatory Visit (INDEPENDENT_AMBULATORY_CARE_PROVIDER_SITE_OTHER): Payer: Medicare HMO | Admitting: Urology

## 2017-11-17 DIAGNOSIS — N3941 Urge incontinence: Secondary | ICD-10-CM | POA: Diagnosis not present

## 2017-11-17 DIAGNOSIS — R351 Nocturia: Secondary | ICD-10-CM

## 2017-11-17 DIAGNOSIS — N401 Enlarged prostate with lower urinary tract symptoms: Secondary | ICD-10-CM | POA: Diagnosis not present

## 2017-11-28 DIAGNOSIS — G894 Chronic pain syndrome: Secondary | ICD-10-CM | POA: Diagnosis not present

## 2017-11-28 DIAGNOSIS — M5416 Radiculopathy, lumbar region: Secondary | ICD-10-CM | POA: Diagnosis not present

## 2017-11-28 DIAGNOSIS — M545 Low back pain: Secondary | ICD-10-CM | POA: Diagnosis not present

## 2017-11-28 DIAGNOSIS — M961 Postlaminectomy syndrome, not elsewhere classified: Secondary | ICD-10-CM | POA: Diagnosis not present

## 2017-12-05 DIAGNOSIS — N401 Enlarged prostate with lower urinary tract symptoms: Secondary | ICD-10-CM | POA: Diagnosis not present

## 2017-12-05 DIAGNOSIS — Z1389 Encounter for screening for other disorder: Secondary | ICD-10-CM | POA: Diagnosis not present

## 2017-12-05 DIAGNOSIS — Z1331 Encounter for screening for depression: Secondary | ICD-10-CM | POA: Diagnosis not present

## 2017-12-05 DIAGNOSIS — J449 Chronic obstructive pulmonary disease, unspecified: Secondary | ICD-10-CM | POA: Diagnosis not present

## 2017-12-05 DIAGNOSIS — F17200 Nicotine dependence, unspecified, uncomplicated: Secondary | ICD-10-CM | POA: Diagnosis not present

## 2017-12-05 DIAGNOSIS — K219 Gastro-esophageal reflux disease without esophagitis: Secondary | ICD-10-CM | POA: Diagnosis not present

## 2017-12-13 DIAGNOSIS — R69 Illness, unspecified: Secondary | ICD-10-CM | POA: Diagnosis not present

## 2017-12-14 DIAGNOSIS — J441 Chronic obstructive pulmonary disease with (acute) exacerbation: Secondary | ICD-10-CM | POA: Diagnosis not present

## 2017-12-14 DIAGNOSIS — J4520 Mild intermittent asthma, uncomplicated: Secondary | ICD-10-CM | POA: Diagnosis not present

## 2017-12-14 DIAGNOSIS — R0602 Shortness of breath: Secondary | ICD-10-CM | POA: Diagnosis not present

## 2017-12-14 DIAGNOSIS — J449 Chronic obstructive pulmonary disease, unspecified: Secondary | ICD-10-CM | POA: Diagnosis not present

## 2017-12-26 ENCOUNTER — Other Ambulatory Visit: Payer: Self-pay

## 2017-12-26 NOTE — Patient Outreach (Signed)
[facility]) Care Management  [place]  12/26/2017   NOTNAMED SCHOLZ 1956/06/22 [id number]  Subjective: Telephone to the patient for monthly assessment. HIPAA verified.  The patient states that he is doing fine.  He denies any pain or falls.The patient states that he did not check his blood sugar this morning yet but yesterday it was 94.  The patient is monitoring his diet.  He states that he has started to drink ensure.The patient is staying active by working in is yard and walking.  He states that he has an appointment with his physician in 3 months for his next check.   Encounter Medications:  Outpatient Encounter Medications as of 12/26/2017  Medication Sig  . albuterol (PROVENTIL HFA;VENTOLIN HFA) 108 (90 Base) MCG/ACT inhaler Inhale 1-2 puffs every 4 (four) hours as needed into the lungs for wheezing or shortness of breath. Do not use with nebulizer (Patient taking differently: Inhale 1 puff into the lungs every 4 (four) hours as needed for wheezing or shortness of breath. Do not use with nebulizer)  . albuterol (PROVENTIL) (2.5 MG/3ML) 0.083% nebulizer solution Take 3 mLs (2.5 mg total) every 4 (four) hours as needed by nebulization for wheezing or shortness of breath.  . AMITIZA 24 MCG capsule Take 24 mcg by mouth 2 (two) times daily.   Marland Kitchen atorvastatin (LIPITOR) 40 MG tablet Take 1 tablet (40 mg total) daily by mouth.  . benzonatate (TESSALON) 100 MG capsule Take 1 capsule (100 mg total) 3 (three) times daily as needed by mouth for cough.  . blood glucose meter kit and supplies Dispense based on patient and insurance preference. Use up to four times daily as directed. (FOR ICD-9 250.00, 250.01).  Marland Kitchen docusate sodium (COLACE) 100 MG capsule Take 1 capsule (100 mg total) by mouth 2 (two) times daily.  Marland Kitchen gabapentin (NEURONTIN) 300 MG capsule Take 300 mg by mouth 2 (two) times daily.  Marland Kitchen ipratropium-albuterol (DUONEB) 0.5-2.5 (3) MG/3ML SOLN Take 3 mLs every 6 (six) hours  as needed by nebulization (wheezing).  Marland Kitchen lisinopril-hydrochlorothiazide (PRINZIDE,ZESTORETIC) 10-12.5 MG tablet Take 1 tablet by mouth daily.  . meloxicam (MOBIC) 7.5 MG tablet Take 7.5 mg by mouth daily.   . metFORMIN (GLUCOPHAGE) 500 MG tablet Take 1 tablet (500 mg total) daily with breakfast by mouth.  . pantoprazole (PROTONIX) 40 MG tablet Take 1 tablet (40 mg total) by mouth daily before breakfast.  . TRELEGY ELLIPTA 100-62.5-25 MCG/INH AEPB Inhale 1 puff into the lungs daily.   Marland Kitchen aspirin 325 MG tablet Take 1 tablet (325 mg total) daily by mouth. (Patient not taking: Reported on 12/26/2017)   No facility-administered encounter medications on file as of 12/26/2017.     Functional Status:  In your present state of health, do you have any difficulty performing the following activities: 08/17/2017 08/17/2017  Hearing? - N  Vision? - N  Comment - -  Difficulty concentrating or making decisions? - N  Walking or climbing stairs? - N  Comment - -  Dressing or bathing? - N  Doing errands, shopping? N -  Comment - -  Some recent data might be hidden    Fall/Depression Screening: Fall Risk  12/26/2017 11/09/2017 10/12/2017  Falls in the past year? No No No   PHQ 2/9 Scores 08/09/2017  PHQ - 2 Score 0    Assessment: Patient continues to benefit from health coach outreach for disease management and support.    [facility] CM Care Plan Problem One  Most Recent Value  THN Long Term Goal   in 90 days the patient will verbalize that his a1c has not risen above 6.5  THN Long Term Goal Start Date  12/26/17  Interventions for Problem One Long Term Goal  RNHC discussed the patient diet, exericise and medication adherence.     Plan: RN Health Coach will contact patient in the month of May  and patient agrees to next outreach.  Traci Coles RN, BSN, CPC RN Health Coach Disease Management [facility] Direct Dial:  [phone]  Fax: [phone]      

## 2018-01-08 DIAGNOSIS — I10 Essential (primary) hypertension: Secondary | ICD-10-CM | POA: Diagnosis not present

## 2018-01-08 DIAGNOSIS — K219 Gastro-esophageal reflux disease without esophagitis: Secondary | ICD-10-CM | POA: Diagnosis not present

## 2018-01-08 DIAGNOSIS — E1142 Type 2 diabetes mellitus with diabetic polyneuropathy: Secondary | ICD-10-CM | POA: Diagnosis not present

## 2018-01-08 DIAGNOSIS — J449 Chronic obstructive pulmonary disease, unspecified: Secondary | ICD-10-CM | POA: Diagnosis not present

## 2018-01-13 DIAGNOSIS — J449 Chronic obstructive pulmonary disease, unspecified: Secondary | ICD-10-CM | POA: Diagnosis not present

## 2018-01-13 DIAGNOSIS — J441 Chronic obstructive pulmonary disease with (acute) exacerbation: Secondary | ICD-10-CM | POA: Diagnosis not present

## 2018-01-13 DIAGNOSIS — J4520 Mild intermittent asthma, uncomplicated: Secondary | ICD-10-CM | POA: Diagnosis not present

## 2018-01-13 DIAGNOSIS — R0602 Shortness of breath: Secondary | ICD-10-CM | POA: Diagnosis not present

## 2018-02-01 ENCOUNTER — Encounter: Payer: Self-pay | Admitting: Gastroenterology

## 2018-02-01 ENCOUNTER — Ambulatory Visit (INDEPENDENT_AMBULATORY_CARE_PROVIDER_SITE_OTHER): Payer: Medicare HMO | Admitting: Gastroenterology

## 2018-02-01 ENCOUNTER — Encounter: Payer: Self-pay | Admitting: *Deleted

## 2018-02-01 VITALS — BP 100/62 | HR 80 | Temp 97.9°F | Ht 63.0 in | Wt 119.2 lb

## 2018-02-01 DIAGNOSIS — K5900 Constipation, unspecified: Secondary | ICD-10-CM

## 2018-02-01 DIAGNOSIS — R1310 Dysphagia, unspecified: Secondary | ICD-10-CM | POA: Diagnosis not present

## 2018-02-01 DIAGNOSIS — R1013 Epigastric pain: Secondary | ICD-10-CM

## 2018-02-01 LAB — CBC WITH DIFFERENTIAL/PLATELET
BASOS ABS: 38 {cells}/uL (ref 0–200)
Basophils Relative: 0.6 %
EOS ABS: 183 {cells}/uL (ref 15–500)
Eosinophils Relative: 2.9 %
HCT: 38.8 % (ref 38.5–50.0)
Hemoglobin: 13.2 g/dL (ref 13.2–17.1)
Lymphs Abs: 1909 cells/uL (ref 850–3900)
MCH: 29.1 pg (ref 27.0–33.0)
MCHC: 34 g/dL (ref 32.0–36.0)
MCV: 85.7 fL (ref 80.0–100.0)
MONOS PCT: 13 %
MPV: 11 fL (ref 7.5–12.5)
NEUTROS ABS: 3352 {cells}/uL (ref 1500–7800)
Neutrophils Relative %: 53.2 %
Platelets: 248 10*3/uL (ref 140–400)
RBC: 4.53 10*6/uL (ref 4.20–5.80)
RDW: 14.1 % (ref 11.0–15.0)
Total Lymphocyte: 30.3 %
WBC mixed population: 819 cells/uL (ref 200–950)
WBC: 6.3 10*3/uL (ref 3.8–10.8)

## 2018-02-01 LAB — COMPLETE METABOLIC PANEL WITH GFR
AG RATIO: 1.5 (calc) (ref 1.0–2.5)
ALBUMIN MSPROF: 4 g/dL (ref 3.6–5.1)
ALKALINE PHOSPHATASE (APISO): 99 U/L (ref 40–115)
ALT: 11 U/L (ref 9–46)
AST: 13 U/L (ref 10–35)
BILIRUBIN TOTAL: 0.3 mg/dL (ref 0.2–1.2)
BUN / CREAT RATIO: 11 (calc) (ref 6–22)
BUN: 21 mg/dL (ref 7–25)
CHLORIDE: 102 mmol/L (ref 98–110)
CO2: 28 mmol/L (ref 20–32)
Calcium: 9.4 mg/dL (ref 8.6–10.3)
Creat: 1.91 mg/dL — ABNORMAL HIGH (ref 0.70–1.25)
GFR, Est African American: 43 mL/min/{1.73_m2} — ABNORMAL LOW (ref >=60)
GFR, Est Non African American: 37 mL/min/{1.73_m2} — ABNORMAL LOW (ref >=60)
GLOBULIN: 2.6 g/dL (ref 1.9–3.7)
GLUCOSE: 109 mg/dL (ref 65–139)
POTASSIUM: 4.5 mmol/L (ref 3.5–5.3)
SODIUM: 134 mmol/L — AB (ref 135–146)
Total Protein: 6.6 g/dL (ref 6.1–8.1)

## 2018-02-01 LAB — LIPASE: Lipase: 86 U/L — ABNORMAL HIGH (ref 7–60)

## 2018-02-01 MED ORDER — PANTOPRAZOLE SODIUM 40 MG PO TBEC: 40.0000 mg | DELAYED_RELEASE_TABLET | Freq: Two times a day (BID) | ORAL | 3 refills | Status: DC

## 2018-02-01 NOTE — Progress Notes (Signed)
Referring Provider: Rosita Fire, MD Primary Care Physician:  Rosita Fire, MD  Primary GI: Dr. Gala Romney   Chief Complaint  Patient presents with  . Dysphagia    with any foods he eats, some liquids    HPI:   Bryan [name] is a 62 y.o. male presenting today with a history of chronic abdominal pain, last seen in Oct 2018. Colonoscopy up-to-date and due again in 2023 due to history of adenomas. EGD 05/2017 with reflux esophagitis s/p empiric dilation, normal stomach and duodenum. Last BPE Nov 2015 with minimal age-related esophageal dysmotility.    Notes 2 week worsening of epigastric pain. Pain present at least 2-3 months per patient. However, he has a history of chronic epigastric pain after review of records.  Not associated with eating. Intermittent, some days without pain. Intermittent nausea, some vomiting. Acute onset, without aggravating factors. States he felt a little bit better with dysphagia after last dilation but "not long". Lasted about one day then came back on him. Sometimes feels likes he needs to regurgitate food. Sometimes feels like liquids are going down the "wrong pipe". Chicken goes down pretty good, sometimes hung. Feels like it gets hung up in lower esophagus.   Amitiza 24 mcg for constipation. Sometimes incontinent "comes out before I know it and I don't like it". Taking it prn. States he can't take it every day because "it messes me up". Every time he takes the Amitiza, he feels nauseated and abdominal cramping.   Last CT without contrast in 2017. Last [place] RUQ in 2015 without gallstones.    Past Medical History:  Diagnosis Date  . Acid reflux   . Arthritis   . Asthma   . Chronic pain    with leg and back pain (disc problem)  . COPD (chronic obstructive pulmonary disease) ([place])   . Headache    HX OF  . History of upper GI x-ray series    to follow showed large duodenal ulcer H pylori serologies were negative  . Hypertension   . Pneumonia    07/17/17  .  Pre-diabetes   . Stroke [facility])    TIA MINI STROKE  . Tubular adenoma     Past Surgical History:  Procedure Laterality Date  . BACK SURGERY  7/05; 5/09    Dr.Hirsch,3 lumbar  X3  . BACK SURGERY    . COLONOSCOPY  Feb 2012   Dr. Gala Romney: normal rectum, pedunculate polyp removed but not recovered  . COLONOSCOPY WITH ESOPHAGOGASTRODUODENOSCOPY (EGD) N/A 11/18/2013   Dr.Rourk- tcs= normal rectum, multipe polyps about the ileocecal valve and distal transverse colon o/w the remainder of the colonic mucosa appeared normal bx= tubular adenoma. EGD= normal esophagus, stomach with scattered erosions mottling, friablility, no ulcer or infiltrating process patent pylorus bx= chronic inflammation. next TCS 11/2016  . COLONOSCOPY WITH PROPOFOL N/A 05/25/2017   Procedure: COLONOSCOPY WITH PROPOFOL;  Surgeon: Daneil Dolin, MD;  [facility];  Service: Endoscopy;  Laterality: N/A;  8:30am  . ESOPHAGOGASTRODUODENOSCOPY  1/06   Dr. Volney American esophageal erosions,U-shaped stomach,marked erosions and edema of the bulb without discrete ulcer disease.   . ESOPHAGOGASTRODUODENOSCOPY (EGD) WITH PROPOFOL N/A 05/25/2017   Procedure: ESOPHAGOGASTRODUODENOSCOPY (EGD) WITH PROPOFOL;  Surgeon: Daneil Dolin, MD;  [facility]: [facility];  Service: Endoscopy;  Laterality: N/A;  . Venia Minks DILATION N/A 05/25/2017   Procedure: Venia Minks DILATION;  Surgeon: Daneil Dolin, MD;  Location: [facility];  Service: Endoscopy;  Laterality: N/A;  .  NECK SURGERY  10/2007   PLATE IN NECK  . POLYPECTOMY  05/25/2017   Procedure: POLYPECTOMY;  Surgeon: Daneil Dolin, MD;  Location: [facility];  Service: Endoscopy;;  rectal  . TRANSURETHRAL RESECTION OF PROSTATE N/A 08/17/2017   Procedure: TRANSURETHRAL RESECTION OF THE PROSTATE (TURP);  Surgeon: Irine Seal, MD;  Location: [facility];  Service: Urology;  Laterality: N/A;    Current Outpatient Medications  Medication Sig Dispense Refill  . albuterol (PROVENTIL HFA;VENTOLIN  HFA) 108 (90 Base) MCG/ACT inhaler Inhale 1-2 puffs every 4 (four) hours as needed into the lungs for wheezing or shortness of breath. Do not use with nebulizer (Patient taking differently: Inhale 1 puff into the lungs every 4 (four) hours as needed for wheezing or shortness of breath. Do not use with nebulizer) 1 Inhaler 0  . albuterol (PROVENTIL) (2.5 MG/3ML) 0.083% nebulizer solution Take 3 mLs (2.5 mg total) every 4 (four) hours as needed by nebulization for wheezing or shortness of breath. 75 mL 0  . AMITIZA 24 MCG capsule Take 24 mcg by mouth as needed.   3  . atorvastatin (LIPITOR) 40 MG tablet Take 1 tablet (40 mg total) daily by mouth. 30 tablet 11  . blood glucose meter kit and supplies Dispense based on patient and insurance preference. Use up to four times daily as directed. (FOR ICD-9 250.00, 250.01). 1 each 0  . docusate sodium (COLACE) 100 MG capsule Take 1 capsule (100 mg total) by mouth 2 (two) times daily. (Patient taking differently: Take 100 mg by mouth as needed. ) 60 capsule 2  . gabapentin (NEURONTIN) 300 MG capsule Take 300 mg by mouth 2 (two) times daily.    Marland Kitchen ipratropium-albuterol (DUONEB) 0.5-2.5 (3) MG/3ML SOLN Take 3 mLs every 6 (six) hours as needed by nebulization (wheezing). 360 mL 0  . lisinopril-hydrochlorothiazide (PRINZIDE,ZESTORETIC) 10-12.5 MG tablet Take 1 tablet by mouth daily.    . meloxicam (MOBIC) 7.5 MG tablet Take 7.5 mg by mouth daily.     . metFORMIN (GLUCOPHAGE) 500 MG tablet Take 1 tablet (500 mg total) daily with breakfast by mouth. 30 tablet 0  . pantoprazole (PROTONIX) 40 MG tablet Take 1 tablet (40 mg total) by mouth daily before breakfast. 30 tablet 11  . TRELEGY ELLIPTA 100-62.5-25 MCG/INH AEPB Inhale 1 puff into the lungs daily.     Marland Kitchen aspirin 325 MG tablet Take 1 tablet (325 mg total) daily by mouth. (Patient not taking: Reported on 12/26/2017) 120 tablet 0  . benzonatate (TESSALON) 100 MG capsule Take 1 capsule (100 mg total) 3 (three) times daily  as needed by mouth for cough. (Patient not taking: Reported on 02/01/2018) 20 capsule 0   No current facility-administered medications for this visit.     Allergies as of 02/01/2018  . (No Known Allergies)    Family History  Problem Relation Age of Onset  . Cancer Father   . Asthma Mother   . Colon cancer Neg Hx     Social History   Socioeconomic History  . Marital status: Single    Spouse name: Not on file  . Number of children: 2  . Years of education: Not on file  . Highest education level: Not on file  Occupational History  . Occupation: disabled    Fish farm manager: UNEMPLOYED  Social Needs  . Financial resource strain: Not on file  . Food insecurity:    Worry: Not on file    Inability: Not on file  . Transportation needs:  Medical: Not on file    Non-medical: Not on file  Tobacco Use  . Smoking status: Former Smoker    Packs/day: 0.50    Years: 40.00    Pack years: 20.00    Types: Cigarettes    Last attempt to quit: 11/11/2014    Years since quitting: 3.2  . Smokeless tobacco: Never Used  Substance and Sexual Activity  . Alcohol use: No    Alcohol/week: 0.0 oz  . Drug use: Yes    Types: Marijuana    Comment:  'every once in a while"  . Sexual activity: Not Currently    Birth control/protection: None  Lifestyle  . Physical activity:    Days per week: Not on file    Minutes per session: Not on file  . Stress: Not on file  Relationships  . Social connections:    Talks on phone: Not on file    Gets together: Not on file    Attends religious service: Not on file    Active member of club or organization: Not on file    Attends meetings of clubs or organizations: Not on file    Relationship status: Not on file  Other Topics Concern  . Not on file  Social History Narrative   Lives with girlfriend, is on disability for history of back injuries.    Review of Systems: Gen: Denies fever, chills, anorexia. Denies fatigue, weakness, weight loss.  CV: Denies  chest pain, palpitations, syncope, peripheral edema, and claudication. Resp: Denies dyspnea at rest, cough, wheezing, coughing up blood, and pleurisy. GI: see HPI  Derm: Denies rash, itching, dry skin Psych: Denies depression, anxiety, memory loss, confusion. No homicidal or suicidal ideation.  Heme: Denies bruising, bleeding, and enlarged lymph nodes.  Physical Exam: BP 100/62   Pulse 80   Temp 97.9 F (36.6 C) (Oral)   Ht _0  (1.6 m)   Wt 119 lb 3.2 oz (54.1 kg)   BMI 21.12 kg/m  General:   Alert and oriented. No distress noted. Pleasant and cooperative.  Head:  Normocephalic and atraumatic. Eyes:  Conjuctiva clear without scleral icterus. Mouth:  Oral mucosa pink and moist.  Abdomen:  +BS, soft, moderate TTP upper abdomen and non-distended. No rebound or guarding. No HSM or masses noted. Msk:  Symmetrical without gross deformities. Normal posture. Extremities:  Without edema. Neurologic:  Alert and  oriented x4 Psych:  Alert and cooperative. Normal mood and affect.

## 2018-02-01 NOTE — Assessment & Plan Note (Signed)
Amitiza causing nausea, abdominal pain, diarrhea. Discontinue this. Start linzess 145 mcg once daily. Samples provided. Follow-up in 6 weeks.

## 2018-02-01 NOTE — Assessment & Plan Note (Addendum)
No significant improvement with empiric dilation in Sept 2018. Esophagitis on EGD Sept 2018 likely playing a role. ?motility disorder. Last BPE almost 4 years ago. Update BPE now. He has a history of some type of neck surgery with "plate" in 2009. Unclear if this is playing a role or not. Further recommendations after BPE. Increasing PPI to BID as well in case esophagitis is contributing.

## 2018-02-01 NOTE — Assessment & Plan Note (Signed)
62 year old male with chronic epigastric pain, now worsening over past few weeks. Last CT (without contrast) in 2017, last [place] in 2015 without stones. Gallbladder in situ. Doubt dealing with biliary etiology as he notes no exacerbation with eating. He does have a known history of esophagitis, which is more likely playing a role. EGD fairly recent in Sept 2018 with normal stomach. Low likelihood of chronic mesenteric ischemia, as there are no postprandial symptoms. Weight fairly stable. Doubt acute pancreatitis.   Increase Protonix to BID CBC, CMP, lipase today Will pursue either RUQ ultrasound vs CT but favor CT at this point as it has been some time since last abdominal imaging For constipation (could also play a role): stop Amitiza as not tolerating this. Start Linzess 145. See "constipation" Return for close follow-up in 6 weeks

## 2018-02-01 NOTE — Progress Notes (Signed)
CC'D TO PCP °

## 2018-02-01 NOTE — Patient Instructions (Signed)
Please stop Amitiza. This is likely causing nausea. Instead, take Linzess 1 capsule 30 minutes before breakfast on an empty stomach daily. You may have loose stool for the first few days, but it should get better. Call me to let me know how it is going.  I increased Protonix to twice a day for now.  I have ordered blood work. We will decide on a CT scan or an ultrasound depending on what this shows.  I have ordered an xray of your esophagus.   I will see you in 6 weeks!  It was a pleasure to see you today. I strive to create trusting relationships with patients to provide genuine, compassionate, and quality care. I value your feedback. If you receive a survey regarding your visit,  I greatly appreciate you taking time to fill this out.   [name], PhD, ANP-BC [facility]

## 2018-02-02 ENCOUNTER — Ambulatory Visit (INDEPENDENT_AMBULATORY_CARE_PROVIDER_SITE_OTHER): Payer: Medicare HMO | Admitting: Urology

## 2018-02-02 DIAGNOSIS — R351 Nocturia: Secondary | ICD-10-CM | POA: Diagnosis not present

## 2018-02-02 DIAGNOSIS — N401 Enlarged prostate with lower urinary tract symptoms: Secondary | ICD-10-CM

## 2018-02-02 DIAGNOSIS — R350 Frequency of micturition: Secondary | ICD-10-CM

## 2018-02-06 NOTE — Progress Notes (Signed)
Reviewed labs. No leukocytosis. No anemia. Creatinine increased to 1.91 (5 months ago was 1.20). I don't know his baseline, but it seems to be more in the 1.2 to 1.5 range. Normal LFTs. Lipase non-specifically elevated mildly at 86 but not consistent with likely pancreatitis in setting of renal disease. Please copy labs to PCP so they are aware of creatinine. Please let patient know as well. We need to pursue CT abd/pelvis with oral contrast only (due to renal function). HOLD METFORMIN for 35 HOURS AFTER CT SCAN.

## 2018-02-07 ENCOUNTER — Ambulatory Visit (HOSPITAL_COMMUNITY): Payer: Medicare HMO

## 2018-02-07 ENCOUNTER — Other Ambulatory Visit: Payer: Self-pay

## 2018-02-07 DIAGNOSIS — R1013 Epigastric pain: Secondary | ICD-10-CM

## 2018-02-07 NOTE — Progress Notes (Signed)
We need CT done this week if at all possible.

## 2018-02-08 ENCOUNTER — Other Ambulatory Visit: Payer: Self-pay

## 2018-02-08 DIAGNOSIS — R1013 Epigastric pain: Secondary | ICD-10-CM

## 2018-02-08 NOTE — Patient Outreach (Signed)
[place] [facility]) Care Management  [place]  02/08/2018   Bryan [name] 1956/07/12 [id number]  Subjective: Telephone call to the patient for monthly assessment.  HIPAA verified.  The patient states that he is doing well.  He checked his blood sugar this morning and it was 92.  He denies any pain or falls.  The patient is walking every morning.  He is being adherent with his medications.  The patient discussed that he is finding that the burning and tingling is his legs is getting worse.  He takes gabapentin 600 mg bid but it is not helping.  I advised the patient to notify his physician about his condition.  The patient verbalized understanding.  The patient also states that he finds it harder to breath in the heat. He is using his inhalers and feels that his Trelegy really helps .  He states that he is unable to afford the inhaler it will cost him 198 dollars at the pharmacy. I advised the patient that I will send a referral to pharmacy to see if they can help with the cost of his inhaler.  We also discussed how the heat can affect his breathing and things he can do to help. He verbalized understanding.  The patient has a follow up appointment with his physician next month.  Encounter Medications:  Outpatient Encounter Medications as of 02/08/2018  Medication Sig Note  . atorvastatin (LIPITOR) 40 MG tablet Take 1 tablet (40 mg total) daily by mouth.   . blood glucose meter kit and supplies Dispense based on patient and insurance preference. Use up to four times daily as directed. (FOR ICD-9 250.00, 250.01).   Marland Kitchen docusate sodium (COLACE) 100 MG capsule Take 1 capsule (100 mg total) by mouth 2 (two) times daily. (Patient taking differently: Take 100 mg by mouth as needed. )   . gabapentin (NEURONTIN) 300 MG capsule Take 300 mg by mouth 2 (two) times daily. 02/08/2018: Patient states that he takes 600 mg bid  . ipratropium-albuterol (DUONEB) 0.5-2.5 (3) MG/3ML SOLN Take 3 mLs every 6  (six) hours as needed by nebulization (wheezing).   Marland Kitchen lisinopril-hydrochlorothiazide (PRINZIDE,ZESTORETIC) 10-12.5 MG tablet Take 1 tablet by mouth daily.   . meloxicam (MOBIC) 7.5 MG tablet Take 7.5 mg by mouth daily.    . metFORMIN (GLUCOPHAGE) 500 MG tablet Take 1 tablet (500 mg total) daily with breakfast by mouth.   . pantoprazole (PROTONIX) 40 MG tablet Take 1 tablet (40 mg total) by mouth 2 (two) times daily before a meal.   . TRELEGY ELLIPTA 100-62.5-25 MCG/INH AEPB Inhale 1 puff into the lungs daily.    Marland Kitchen albuterol (PROVENTIL HFA;VENTOLIN HFA) 108 (90 Base) MCG/ACT inhaler Inhale 1-2 puffs every 4 (four) hours as needed into the lungs for wheezing or shortness of breath. Do not use with nebulizer (Patient taking differently: Inhale 1 puff into the lungs every 4 (four) hours as needed for wheezing or shortness of breath. Do not use with nebulizer)   . albuterol (PROVENTIL) (2.5 MG/3ML) 0.083% nebulizer solution Take 3 mLs (2.5 mg total) every 4 (four) hours as needed by nebulization for wheezing or shortness of breath.   . AMITIZA 24 MCG capsule Take 24 mcg by mouth as needed.     No facility-administered encounter medications on file as of 02/08/2018.     Functional Status:  In your present state of health, do you have any difficulty performing the following activities: 08/17/2017 08/17/2017  Hearing? - N  Vision? - N  Comment - -  Difficulty concentrating or making decisions? - N  Walking or climbing stairs? - N  Comment - -  Dressing or bathing? - N  Doing errands, shopping? N -  Comment - -  Some recent data might be hidden    Fall/Depression Screening: Fall Risk  02/08/2018 12/26/2017 11/09/2017  Falls in the past year? No No No   PHQ 2/9 Scores 08/09/2017  PHQ - 2 Score 0    Assessment: Patient will continue to benefit from health coach outreach for disease management and support.  [facility] CM Care Plan Problem One     Most Recent Value  [facility]N Long Term Goal   In 90 days the  patient will verbalize that he has maintainedhis a1c at 6.5  [facility] Long Term Goal Start Date  02/08/18  Interventions for Problem One Long Term Goal  discussed diet ,exercise and adherence to medications  [facility] CM Short Term Goal #1   In 30 days the patient will verbalize that he has discussed his concern of burning and tingling in his legs with his physician  [facility] CM Short Term Goal #1 Start Date  02/08/18  [facility] CM Short Term Goal #2   In 30 days the patient will verbalize that he has spoken with pharmacist about medication assistance with Trelegy  [facility] CM Short Term Goal #2 Start Date  02/08/18  Interventions for Short Term Goal #2  discussed with the patient about how pharmacist can possibly help with medication assistance     Plan: RN Health Coach will contact patient in the month of June and patient agrees to next outreach.  [name] RN, BSN, [place] Direct Dial:  [phone]  Fax: [phone]

## 2018-02-09 ENCOUNTER — Ambulatory Visit (HOSPITAL_COMMUNITY)
Admission: RE | Admit: 2018-02-09 | Discharge: 2018-02-09 | Disposition: A | Discharge status: Home / Self Care | Payer: Medicare HMO | Source: Ambulatory Visit | Attending: Gastroenterology | Admitting: Gastroenterology

## 2018-02-09 ENCOUNTER — Other Ambulatory Visit: Payer: Self-pay

## 2018-02-09 DIAGNOSIS — R933 Abnormal findings on diagnostic imaging of other parts of digestive tract: Secondary | ICD-10-CM | POA: Insufficient documentation

## 2018-02-09 DIAGNOSIS — M4326 Fusion of spine, lumbar region: Secondary | ICD-10-CM | POA: Insufficient documentation

## 2018-02-09 DIAGNOSIS — M5136 Other intervertebral disc degeneration, lumbar region: Secondary | ICD-10-CM | POA: Diagnosis not present

## 2018-02-09 DIAGNOSIS — I700 Atherosclerosis of aorta: Secondary | ICD-10-CM | POA: Diagnosis not present

## 2018-02-09 DIAGNOSIS — R1013 Epigastric pain: Secondary | ICD-10-CM | POA: Diagnosis not present

## 2018-02-09 DIAGNOSIS — R1111 Vomiting without nausea: Secondary | ICD-10-CM | POA: Diagnosis not present

## 2018-02-09 DIAGNOSIS — Z96698 Presence of other orthopedic joint implants: Secondary | ICD-10-CM | POA: Insufficient documentation

## 2018-02-09 NOTE — Patient Outreach (Signed)
[place] [facility]) Care Management  02/09/2018  Bryan [name] April 16, 1956 [id number]   62 year old male referred to [facility].  [place] services requested for medication assistance for Trelegy Ellipta inhaler.  PMHx includes, but not limited to, hypertension, TIA, COPD, GERD and BPH.  Unsuccessful outreach attempt #1 to [name].  Left HIPAA compliant voice message requesting return call.  Plan: Outreach attempt #2 on Tuesday 02/13/18.   Will send unsuccessful outreach attempt letter to [name].   Joetta Manners, PharmD Clinical Pharmacist [place] [phone]

## 2018-02-09 NOTE — Progress Notes (Signed)
Contacted patient. History of chronic constipation. He was started on Linzess 145 instead of Amitiza at visit last week. He notes some loose stool, but this is likely moreso due to [place]. CT with wall thickening throughout sigmoid without focal inflammatory changes, ?under distension. Will need to review more with radiology when return on Monday. No concerning features. I doubt we are dealing with colitis. I have asked patient to open the linzess capsules and only take 1/2 daily with applesauce. Hopefully, this can help with the loose stool. If he continues to have symptoms, will check stool studies.Will not treat empirically as he clinically is unchanged from when I saw him in the office. Doubt dealing with colitis.

## 2018-02-13 ENCOUNTER — Other Ambulatory Visit: Payer: Self-pay

## 2018-02-13 DIAGNOSIS — J4520 Mild intermittent asthma, uncomplicated: Secondary | ICD-10-CM | POA: Diagnosis not present

## 2018-02-13 DIAGNOSIS — J441 Chronic obstructive pulmonary disease with (acute) exacerbation: Secondary | ICD-10-CM | POA: Diagnosis not present

## 2018-02-13 DIAGNOSIS — R0602 Shortness of breath: Secondary | ICD-10-CM | POA: Diagnosis not present

## 2018-02-13 DIAGNOSIS — J449 Chronic obstructive pulmonary disease, unspecified: Secondary | ICD-10-CM | POA: Diagnosis not present

## 2018-02-13 NOTE — Patient Outreach (Signed)
[facility]) Care Management  [place]  02/13/2018   KAMEN HANKEN 11-Feb-1956 [id number]  62 year old male referred to [place] Management.  [place] services requested for medication assistance for Trelegy Ellipta inhaler.  PMHx includes, but not limited to, hypertension, TIA, COPD, GERD and BPH.  Successful outreach attempt to Mr. Gauger.  HIPAA identifiers verified.   Subjective:  Mr. Buxton reports that he can't afford his Trelegy Ellipta Inhaler for his COPD.  He states that it cost $198, but that he has received samples from Dr. Legrand Rams.  He states that he only has a 3 day supply of his inhaler left and that he is going to Dr. Josephine Cables office today to ask for more samples.    Objective:  SCr 1.26m/dL on 02/01/18 HgA1c 6.3% on 09/16/17 Total cholesterol 129 mg/dL, LDL 58 mg/dL, HDL 49 mg/dL on 09/16/17  Current Medications: Outpatient Encounter Medications as of 02/13/2018  Medication Sig Note  . albuterol (PROVENTIL HFA;VENTOLIN HFA) 108 (90 Base) MCG/ACT inhaler Inhale 1-2 puffs every 4 (four) hours as needed into the lungs for wheezing or shortness of breath. Do not use with nebulizer (Patient taking differently: Inhale 1 puff into the lungs every 4 (four) hours as needed for wheezing or shortness of breath. Do not use with nebulizer)   . albuterol (PROVENTIL) (2.5 MG/3ML) 0.083% nebulizer solution Take 3 mLs (2.5 mg total) every 4 (four) hours as needed by nebulization for wheezing or shortness of breath.   .Marland Kitchenatorvastatin (LIPITOR) 40 MG tablet Take 1 tablet (40 mg total) daily by mouth.   . baclofen (LIORESAL) 10 MG tablet Take 10 mg by mouth 2 (two) times daily as needed for muscle spasms.   . blood glucose meter kit and supplies Dispense based on patient and insurance preference. Use up to four times daily as directed. (FOR ICD-9 250.00, 250.01).   .Marland Kitchengabapentin (NEURONTIN) 600 MG tablet Take 600 mg by mouth 3 (three) times daily.   .Marland Kitchenlinaclotide (LINZESS) 145  MCG CAPS capsule Take 145 mcg by mouth daily before breakfast.   . lisinopril-hydrochlorothiazide (PRINZIDE,ZESTORETIC) 10-12.5 MG tablet Take 1 tablet by mouth daily.   . meloxicam (MOBIC) 7.5 MG tablet Take 7.5 mg by mouth daily.    . metFORMIN (GLUCOPHAGE) 500 MG tablet Take 1 tablet (500 mg total) daily with breakfast by mouth.   . pantoprazole (PROTONIX) 40 MG tablet Take 1 tablet (40 mg total) by mouth 2 (two) times daily before a meal.   . TRELEGY ELLIPTA 100-62.5-25 MCG/INH AEPB Inhale 1 puff into the lungs daily.    .Marland Kitchenipratropium-albuterol (DUONEB) 0.5-2.5 (3) MG/3ML SOLN Take 3 mLs every 6 (six) hours as needed by nebulization (wheezing). (Patient not taking: Reported on 02/13/2018)   . [DISCONTINUED] AMITIZA 24 MCG capsule Take 24 mcg by mouth as needed.    . [DISCONTINUED] docusate sodium (COLACE) 100 MG capsule Take 1 capsule (100 mg total) by mouth 2 (two) times daily. (Patient taking differently: Take 100 mg by mouth as needed. )   . [DISCONTINUED] gabapentin (NEURONTIN) 300 MG capsule Take 300 mg by mouth 2 (two) times daily. 02/08/2018: Patient states that he takes 600 mg bid   No facility-administered encounter medications on file as of 02/13/2018.     Functional Status:  In your present state of health, do you have any difficulty performing the following activities: 08/17/2017 08/17/2017  Hearing? - N  Vision? - N  Comment - -  Difficulty concentrating or  making decisions? - N  Walking or climbing stairs? - N  Comment - -  Dressing or bathing? - N  Doing errands, shopping? N -  Comment - -  Some recent data might be hidden    Fall/Depression Screening: Fall Risk  02/08/2018 12/26/2017 11/09/2017  Falls in the past year? No No No   PHQ 2/9 Scores 08/09/2017  PHQ - 2 Score 0    Medication Assistance: Per financial discussion, Mr. Sales does not qualify for Extra Help LIS at this time.  He reports that he receives a pension that will run out when he turns 44 in August.  He  says he has been drawing on this pension for about 8 years after he hurt his back and couldn't work.  Once this pension stops, Mr. Ayyad may be eligible for Extra Help LIS.  He has taken down my number and will call me back this fall to assist with the online application.  Trelegy Ellipta is made by [employer] and requires an OOP expenditure of $600 on prescriptions before Mr. Prude can apply to their patient assistance program.  Mr. Gellert states that he has not spent $600, as he has been prescribed inexpensive generics up until this point.  He has been receiving samples of Trelegy Ellipta and will need to do so, until he can apply for Extra Help LIS.   Spoke with Tonia Ghent at Dr. Josephine Cables office.  They gave Mr. Dunavant 2 more samples.  The office is aware that I will plan to apply for Extra Help this fall.  Office verbalized they will give him samples when available.  Verified that Trelegy Ellipta is a tier 3 drug for [employer] and costs $45/mo.  Informed patient of this price and he verbalized that he thinks he can afford the inhaler at that price.   Medication Management:   Based on SCr 1.91 mg/dL and estimated CrCl ~ 41 ml/min:  Gabapentin - Please consider dosage adjustment to 200 -700 mg twice daily when CrCl >69m/min - 59 ml/min.   Baclofen - Please consider dosage adjustment  To 2.5 mg every 8 hours when CrCl 30 to < 50 ml/min.   Plan:  Route note to Dr. FLegrand Ramswith above renal dosing recommendations.   eMelvyn Neth [phone]

## 2018-02-20 ENCOUNTER — Telehealth: Payer: Self-pay

## 2018-02-20 ENCOUNTER — Other Ambulatory Visit: Payer: Self-pay

## 2018-02-20 DIAGNOSIS — R197 Diarrhea, unspecified: Secondary | ICD-10-CM

## 2018-02-20 NOTE — Telephone Encounter (Signed)
1. He should have a BPE upcoming due to dysphagia.   2. Stop Linzess. See if this helps with diarrhea. If he does not have improvement within the next 24 hours, need to check stool culture and Cdiff.  3. Reviewed with radiologist CT 02/09/18: not definite colitis at sigmoid. No pericolonic inflammation. Doubt this is an infectious process going on. He does have atherosclerotic disease. With renal function, may be difficult to complete CTA to evaluate vasculature. I do feel he would benefit from further evaluation of vasculature, but I would like to see what his renal function is before going in the direction of CTA vs mesenteric duplex vs simply referring to Vascular clinic to evaluate.  4. Please have updated BMP, unless patient had this recently with PCP.

## 2018-02-20 NOTE — Telephone Encounter (Signed)
Pt came in office with appt letter for CT 02/22/18 and said the appt had been cancelled. Reviewed pt's chart. The CT for 02/22/18 was changed to 02/09/18 d/t AB wanted CT sooner. He wanted to know the results. Pt had already been made aware. Informed of CT results. States he's still having epigastric pain daily. Pain comes and goes. Not worsened by eating. Having trouble swallowing food. Taking Omeprazole 40 mg daily. Having diarrhea every other day 3-4 times. Taking Linzess 58mg half dose daily. [phone]

## 2018-02-20 NOTE — Telephone Encounter (Signed)
Pt notified of AB recommendations. BMP was ordered for [facility].

## 2018-02-21 NOTE — Telephone Encounter (Signed)
Pt called office. He forgot about BPE appt.   BPE scheduled for 02/27/18 at 9:30am, arrive at 9:15am. NPO 3 hours prior to test. Called pt back and informed of appt. Letter mailed.

## 2018-02-21 NOTE — Telephone Encounter (Signed)
BPE was scheduled for 02/07/18. According to appt desk, he was NCNS for the appt. Tried to call pt, no answer, LMOVM for return call.

## 2018-02-22 ENCOUNTER — Other Ambulatory Visit: Payer: Self-pay

## 2018-02-22 ENCOUNTER — Ambulatory Visit (HOSPITAL_COMMUNITY): Payer: Medicare HMO

## 2018-02-22 NOTE — Patient Outreach (Signed)
[facility]) Care Management  02/22/2018  CARNELIUS HAMMITT 1955/12/21 [id number]  62year old malereferred to [place] Management. [place] services requested for medication assistance for Trelegy Ellipta inhaler. PMHx includes, but not limited to, hypertension, TIA, COPD, GERD and BPH.  Successful outreach attempt to Mr. Klees.  HIPAA identifiers verified.   Medication Management: Due to Mr. Ritthaler declining renal function, Dr. Legrand Rams has approved:  Gabapentin decrease to 300 mg oral three times a day.  Baclofen 2.5 mg oral twice daily as needed.   Per Tonia Ghent, at Dr. Josephine Cables office, the new prescriptions have been sent to [facility] mail order. She states she will also authorize refills on his metformin with [employer].  Mr. Bentleigh Waren is aware that he needs to call me this fall when his pension terminates and I will assist with Extra Help LIS online application.  He also should call when he has spent $600 out of pocket on prescriptions and I will apply for Trelegy Ellipta patient assistance from [employer].   Mr. Stollings states that he has no other medication issues or questions at this time.  Informed him that I will close his [place] case, but reminded him to call me when ready to apply for assistance as noted above.   Plan: Close [place] Case.  Route discipline closure letter to Dr. Legrand Rams.  Joetta Manners, PharmD Clinical Pharmacist [place] [phone]

## 2018-02-22 NOTE — Telephone Encounter (Signed)
Pt called office to find out about BPE appt. He didn't write down the appt. Informed of appt.

## 2018-02-27 ENCOUNTER — Ambulatory Visit (HOSPITAL_COMMUNITY)
Admission: RE | Admit: 2018-02-27 | Discharge: 2018-02-27 | Disposition: A | Discharge status: Home / Self Care | Payer: Medicare HMO | Source: Ambulatory Visit | Attending: Gastroenterology | Admitting: Gastroenterology

## 2018-02-27 DIAGNOSIS — T17308A Unspecified foreign body in larynx causing other injury, initial encounter: Secondary | ICD-10-CM | POA: Diagnosis not present

## 2018-02-27 DIAGNOSIS — R1310 Dysphagia, unspecified: Secondary | ICD-10-CM | POA: Diagnosis not present

## 2018-02-27 DIAGNOSIS — J387 Other diseases of larynx: Secondary | ICD-10-CM | POA: Diagnosis not present

## 2018-02-28 ENCOUNTER — Ambulatory Visit: Payer: Self-pay

## 2018-03-01 ENCOUNTER — Other Ambulatory Visit: Payer: Self-pay

## 2018-03-01 NOTE — Patient Outreach (Addendum)
[facility]) Care Management  [place]  03/01/2018   Bryan [name] Oct 20, 1955 [id number]  Subjective: Telephone call to the patient for assessment. HIPAA verified. The patient states that he is having pain in his legs that he rates at a 8/10.  He is walking but due to the pain it is hard. He has not had any falls. He has discussed this information with his physician and on medications but feels they don't help.  He did not check his blood sugar this morning but yesterday it was 97. I advised him to check his blood sugars regularly and record his results.  Patient verbalized understanding. He states that he is adherent with his medication.    The patient states that he is not eating well.  He is unable to swallow foods.  He said he has had a swallowing test and it was normal. I advised him to try to eat soft foods like soups or fruit.  He verbalized understanding.  The patient also states that he is having pain with urination.  I advised the patient to call his physician office and notify them of his condition.  He stated that he would.  He has an appointment with Dr Josephine Cables office on Monday the 24 th.    Encounter Medications:  Outpatient Encounter Medications as of 03/01/2018  Medication Sig Note  . albuterol (PROVENTIL HFA;VENTOLIN HFA) 108 (90 Base) MCG/ACT inhaler Inhale 1-2 puffs every 4 (four) hours as needed into the lungs for wheezing or shortness of breath. Do not use with nebulizer (Patient taking differently: Inhale 1 puff into the lungs every 4 (four) hours as needed for wheezing or shortness of breath. Do not use with nebulizer)   . albuterol (PROVENTIL) (2.5 MG/3ML) 0.083% nebulizer solution Take 3 mLs (2.5 mg total) every 4 (four) hours as needed by nebulization for wheezing or shortness of breath.   Marland Kitchen atorvastatin (LIPITOR) 40 MG tablet Take 1 tablet (40 mg total) daily by mouth.   . blood glucose meter kit and supplies Dispense based on patient and  insurance preference. Use up to four times daily as directed. (FOR ICD-9 250.00, 250.01).   Marland Kitchen gabapentin (NEURONTIN) 600 MG tablet Take 600 mg by mouth 3 (three) times daily. 03/01/2018: Dose reduced to 300 mg per patient   . ipratropium-albuterol (DUONEB) 0.5-2.5 (3) MG/3ML SOLN Take 3 mLs every 6 (six) hours as needed by nebulization (wheezing).   Marland Kitchen linaclotide (LINZESS) 145 MCG CAPS capsule Take 145 mcg by mouth daily before breakfast.   . lisinopril-hydrochlorothiazide (PRINZIDE,ZESTORETIC) 10-12.5 MG tablet Take 1 tablet by mouth daily.   . meloxicam (MOBIC) 7.5 MG tablet Take 7.5 mg by mouth daily.    . metFORMIN (GLUCOPHAGE) 500 MG tablet Take 1 tablet (500 mg total) daily with breakfast by mouth.   . pantoprazole (PROTONIX) 40 MG tablet Take 1 tablet (40 mg total) by mouth 2 (two) times daily before a meal.   . TRELEGY ELLIPTA 100-62.5-25 MCG/INH AEPB Inhale 1 puff into the lungs daily.    . baclofen (LIORESAL) 10 MG tablet Take 10 mg by mouth 2 (two) times daily as needed for muscle spasms.    No facility-administered encounter medications on file as of 03/01/2018.     Functional Status:  In your present state of health, do you have any difficulty performing the following activities: 08/17/2017 08/17/2017  Hearing? - N  Vision? - N  Comment - -  Difficulty concentrating or making decisions? - N  Walking or climbing stairs? - N  Comment - -  Dressing or bathing? - N  Doing errands, shopping? N -  Comment - -  Some recent data might be hidden    Fall/Depression Screening: Fall Risk  03/01/2018 02/08/2018 12/26/2017  Falls in the past year? No No No   PHQ 2/9 Scores 08/09/2017  PHQ - 2 Score 0    Assessment: Patient will continue to benefit from health coach outreach for disease management and support.  THN CM Care Plan Problem One     Most Recent Value  THN Long Term Goal   In 90 days the patient will verbalize that he has maintained his a1c at 6.5  THN Long Term Goal Start  Date  03/01/18  Interventions for Problem One Long Term Goal  talked about diet , exercise and medication adherence  THN CM Short Term Goal #1   In 30 days the patient will verbalize that he has discussed his concern of burning and tingling in his legs with his physician  [facility] CM Short Term Goal #1 Start Date  03/01/18  Interventions for Short Term Goal #1  talked about importance of letting his physician know his condition   THN CM Short Term Goal #2 Start Date  03/01/18  [facility] CM Short Term Goal #2 Met Date  03/01/18  Interventions for Short Term Goal #2  after review of chart the patient has spoken with the pharmacist     Plan: [place] will contact patient in the month of July and patient agrees to next outreach. RN Health Coach will send copy of note to the physician office.  [name] RN, BSN, [place] Direct Dial:  [phone]  Fax: [phone]

## 2018-03-05 DIAGNOSIS — J449 Chronic obstructive pulmonary disease, unspecified: Secondary | ICD-10-CM | POA: Diagnosis not present

## 2018-03-05 DIAGNOSIS — I10 Essential (primary) hypertension: Secondary | ICD-10-CM | POA: Diagnosis not present

## 2018-03-05 DIAGNOSIS — E1142 Type 2 diabetes mellitus with diabetic polyneuropathy: Secondary | ICD-10-CM | POA: Diagnosis not present

## 2018-03-05 DIAGNOSIS — N401 Enlarged prostate with lower urinary tract symptoms: Secondary | ICD-10-CM | POA: Diagnosis not present

## 2018-03-06 NOTE — Progress Notes (Signed)
No stricture. Normal motility. How is he with BID PPI?

## 2018-03-08 ENCOUNTER — Other Ambulatory Visit: Payer: Self-pay | Admitting: *Deleted

## 2018-03-08 DIAGNOSIS — R1310 Dysphagia, unspecified: Secondary | ICD-10-CM

## 2018-03-08 DIAGNOSIS — I10 Essential (primary) hypertension: Secondary | ICD-10-CM | POA: Diagnosis not present

## 2018-03-08 DIAGNOSIS — R1013 Epigastric pain: Secondary | ICD-10-CM

## 2018-03-08 DIAGNOSIS — E7800 Pure hypercholesterolemia, unspecified: Secondary | ICD-10-CM | POA: Diagnosis not present

## 2018-03-08 DIAGNOSIS — H5200 Hypermetropia, unspecified eye: Secondary | ICD-10-CM | POA: Diagnosis not present

## 2018-03-08 NOTE — Progress Notes (Signed)
No known hiatal hernia on imaging or EGD. Chest pain may or may not be related to GI process. We can pursue HIDA scan to rule out biliary dyskinesia. If he has persistent pain, shortness of breath, etc, needs to go to ED if acute or we will discuss cardiology eval at visit.

## 2018-03-08 NOTE — Progress Notes (Signed)
Pt is aware and Ok to schedule the HIDA scan.

## 2018-03-11 ENCOUNTER — Other Ambulatory Visit: Payer: Self-pay

## 2018-03-11 ENCOUNTER — Observation Stay (HOSPITAL_COMMUNITY)
Admission: EM | Admit: 2018-03-11 | Discharge: 2018-03-13 | Disposition: A | Discharge status: Home / Self Care | Payer: Medicare HMO | Attending: Internal Medicine | Admitting: Internal Medicine

## 2018-03-11 ENCOUNTER — Emergency Department (HOSPITAL_COMMUNITY): Payer: Medicare HMO

## 2018-03-11 ENCOUNTER — Encounter (HOSPITAL_COMMUNITY): Payer: Self-pay | Admitting: Emergency Medicine

## 2018-03-11 DIAGNOSIS — M545 Low back pain: Secondary | ICD-10-CM | POA: Insufficient documentation

## 2018-03-11 DIAGNOSIS — R110 Nausea: Secondary | ICD-10-CM | POA: Insufficient documentation

## 2018-03-11 DIAGNOSIS — F1210 Cannabis abuse, uncomplicated: Secondary | ICD-10-CM | POA: Insufficient documentation

## 2018-03-11 DIAGNOSIS — Z79899 Other long term (current) drug therapy: Secondary | ICD-10-CM | POA: Diagnosis not present

## 2018-03-11 DIAGNOSIS — J441 Chronic obstructive pulmonary disease with (acute) exacerbation: Secondary | ICD-10-CM | POA: Diagnosis present

## 2018-03-11 DIAGNOSIS — E1169 Type 2 diabetes mellitus with other specified complication: Secondary | ICD-10-CM | POA: Diagnosis present

## 2018-03-11 DIAGNOSIS — N138 Other obstructive and reflux uropathy: Secondary | ICD-10-CM | POA: Diagnosis present

## 2018-03-11 DIAGNOSIS — R0602 Shortness of breath: Secondary | ICD-10-CM | POA: Diagnosis not present

## 2018-03-11 DIAGNOSIS — Z7984 Long term (current) use of oral hypoglycemic drugs: Secondary | ICD-10-CM | POA: Insufficient documentation

## 2018-03-11 DIAGNOSIS — J45909 Unspecified asthma, uncomplicated: Secondary | ICD-10-CM | POA: Insufficient documentation

## 2018-03-11 DIAGNOSIS — K210 Gastro-esophageal reflux disease with esophagitis: Secondary | ICD-10-CM | POA: Diagnosis not present

## 2018-03-11 DIAGNOSIS — Z8673 Personal history of transient ischemic attack (TIA), and cerebral infarction without residual deficits: Secondary | ICD-10-CM | POA: Insufficient documentation

## 2018-03-11 DIAGNOSIS — Z87891 Personal history of nicotine dependence: Secondary | ICD-10-CM | POA: Diagnosis not present

## 2018-03-11 DIAGNOSIS — R1013 Epigastric pain: Secondary | ICD-10-CM | POA: Diagnosis not present

## 2018-03-11 DIAGNOSIS — N401 Enlarged prostate with lower urinary tract symptoms: Secondary | ICD-10-CM

## 2018-03-11 DIAGNOSIS — R1011 Right upper quadrant pain: Secondary | ICD-10-CM

## 2018-03-11 DIAGNOSIS — E785 Hyperlipidemia, unspecified: Secondary | ICD-10-CM

## 2018-03-11 DIAGNOSIS — N179 Acute kidney failure, unspecified: Secondary | ICD-10-CM | POA: Diagnosis present

## 2018-03-11 DIAGNOSIS — K219 Gastro-esophageal reflux disease without esophagitis: Secondary | ICD-10-CM | POA: Diagnosis present

## 2018-03-11 DIAGNOSIS — R079 Chest pain, unspecified: Principal | ICD-10-CM | POA: Insufficient documentation

## 2018-03-11 DIAGNOSIS — K5900 Constipation, unspecified: Secondary | ICD-10-CM | POA: Diagnosis present

## 2018-03-11 LAB — CBC
HCT: 39.5 % (ref 39.0–52.0)
Hemoglobin: 13.3 g/dL (ref 13.0–17.0)
MCH: 30 pg (ref 26.0–34.0)
MCHC: 33.7 g/dL (ref 30.0–36.0)
MCV: 89 fL (ref 78.0–100.0)
Platelets: 241 10*3/uL (ref 150–400)
RBC: 4.44 MIL/uL (ref 4.22–5.81)
RDW: 14.7 % (ref 11.5–15.5)
WBC: 8.6 10*3/uL (ref 4.0–10.5)

## 2018-03-11 LAB — BASIC METABOLIC PANEL
Anion gap: 7 (ref 5–15)
BUN: 20 mg/dL (ref 8–23)
CO2: 29 mmol/L (ref 22–32)
Calcium: 9.1 mg/dL (ref 8.9–10.3)
Chloride: 105 mmol/L (ref 98–111)
Creatinine, Ser: 1.71 mg/dL — ABNORMAL HIGH (ref 0.61–1.24)
GFR calc Af Amer: 48 mL/min — ABNORMAL LOW (ref >60)
GFR calc non Af Amer: 41 mL/min — ABNORMAL LOW (ref >60)
Glucose, Bld: 129 mg/dL — ABNORMAL HIGH (ref 70–99)
Potassium: 4.5 mmol/L (ref 3.5–5.1)
Sodium: 141 mmol/L (ref 135–145)

## 2018-03-11 LAB — TROPONIN I
Troponin I: <0.03 ng/mL (ref <0.03)
Troponin I: <0.03 ng/mL (ref <0.03)

## 2018-03-11 MED ORDER — MORPHINE SULFATE (PF) 4 MG/ML IV SOLN
4.0000 mg | Freq: Once | INTRAVENOUS | Status: AC
  Administered 2018-03-11: 4 mg via INTRAVENOUS
  Filled 2018-03-11: qty 1

## 2018-03-11 MED ORDER — SODIUM CHLORIDE 0.9 % IV BOLUS
500.0000 mL | Freq: Once | INTRAVENOUS | Status: AC
  Administered 2018-03-11: 500 mL via INTRAVENOUS

## 2018-03-11 NOTE — H&P (Signed)
TRH H&P    [name], is a 62 y.o. male  MRN: [id number]  DOB - [birth year]  Admit Date - 03/11/2018  Referring MD/NP/PA:  Dr Wilson Singer  Outpatient Primary MD for the patient is Rosita Fire, MD  Patient coming from: Home  Chief complaint-  Epigastric pain   HPI:    Bryan [name]  is a 62 y.o. male, with history of COPD, chronic pain syndrome, hypertension came to hospital with epigastric pain and intermittent cramps of lower extremities.  Patient says that he also gets burning sensation in both legs down from the hips to his feet.  Patient was seen by GI as outpatient for ongoing epigastric pain and plan was to get HIDA scan as outpatient.  Patient is supposed to get a HIDA scan tomorrow morning. He denies nausea vomiting or diarrhea. In the ED patient was found to be hypoxic with O2 sats 88%.  He does have history of COPD and does not wear oxygen at home. He denies chest pain but complains of chest tightness He denies fever or chills. No dysuria, urgency or frequency of urination. Denies abdominal pain.    Review of systems:     All other systems reviewed and are negative.   With Past History of the following :    Past Medical History:  Diagnosis Date  . Acid reflux   . Arthritis   . Asthma   . Chronic pain    with leg and back pain (disc problem)  . COPD (chronic obstructive pulmonary disease) ([place])   . Headache    HX OF  . History of upper GI x-ray series    to follow showed large duodenal ulcer H pylori serologies were negative  . Hypertension   . Pneumonia    07/17/17  . Pre-diabetes   . Stroke [facility])    TIA MINI STROKE  . Tubular adenoma       Past Surgical History:  Procedure Laterality Date  . BACK SURGERY  7/05; 5/09    Dr.Hirsch,3 lumbar  X3  . BACK SURGERY    . COLONOSCOPY  Feb 2012   Dr. Gala Romney: normal rectum, pedunculate polyp removed but not  recovered  . COLONOSCOPY WITH ESOPHAGOGASTRODUODENOSCOPY (EGD) N/A 11/18/2013   Dr.Rourk- tcs= normal rectum, multipe polyps about the ileocecal valve and distal transverse colon o/w the remainder of the colonic mucosa appeared normal bx= tubular adenoma. EGD= normal esophagus, stomach with scattered erosions mottling, friablility, no ulcer or infiltrating process patent pylorus bx= chronic inflammation. next TCS 11/2016  . COLONOSCOPY WITH PROPOFOL N/A 05/25/2017   Dr. Gala Romney: sigmoid diverticulosis, one 4 mm hyerplastic rectal polyp, ascending colonic AVMs surveillance 2023  . ESOPHAGOGASTRODUODENOSCOPY  1/06   Dr. Volney American esophageal erosions,U-shaped stomach,marked erosions and edema of the bulb without discrete ulcer disease.   . ESOPHAGOGASTRODUODENOSCOPY (EGD) WITH PROPOFOL N/A 05/25/2017   Dr. Gala Romney: reflux esophagitis s/p empiric dilation, normal stomach and duodenum  . MALONEY DILATION N/A 05/25/2017   Procedure: Venia Minks DILATION;  Surgeon: Daneil Dolin, MD;  [facility];  Service: Endoscopy;  Laterality: N/A;  . NECK SURGERY  10/2007   PLATE IN NECK  . POLYPECTOMY  05/25/2017   Procedure: POLYPECTOMY;  Surgeon: Daneil Dolin, MD;  Location: [facility] ENDO SUITE;  Service: Endoscopy;;  rectal  . TRANSURETHRAL RESECTION OF PROSTATE N/A 08/17/2017   Procedure: TRANSURETHRAL RESECTION OF THE PROSTATE (TURP);  Surgeon: Irine Seal, MD;  Location: [facility] ORS;  Service: Urology;  Laterality: N/A;      Social History:      Social History   Tobacco Use  . Smoking status: Former Smoker    Packs/day: 0.50    Years: 40.00    Pack years: 20.00    Types: Cigarettes    Last attempt to quit: 11/11/2014    Years since quitting: 3.3  . Smokeless tobacco: Never Used  Substance Use Topics  . Alcohol use: No    Alcohol/week: 0.0 oz       Family History :     Family History  Problem Relation Age of Onset  . Cancer Father   . Asthma Mother   . Colon cancer Neg Hx       Home  Medications:   Prior to Admission medications   Medication Sig Start Date End Date Taking? Authorizing Provider  albuterol (PROVENTIL HFA;VENTOLIN HFA) 108 (90 Base) MCG/ACT inhaler Inhale 1-2 puffs every 4 (four) hours as needed into the lungs for wheezing or shortness of breath. Do not use with nebulizer Patient taking differently: Inhale 1 puff into the lungs every 4 (four) hours as needed for wheezing or shortness of breath. Do not use with nebulizer 07/17/17  Yes [name], MD  albuterol (PROVENTIL) (2.5 MG/3ML) 0.083% nebulizer solution Take 3 mLs (2.5 mg total) every 4 (four) hours as needed by nebulization for wheezing or shortness of breath. 07/17/17  Yes [name], MD  atorvastatin (LIPITOR) 40 MG tablet Take 1 tablet (40 mg total) daily by mouth. 07/23/17 07/23/18 Yes [name], MD  blood glucose meter kit and supplies Dispense based on patient and insurance preference. Use up to four times daily as directed. (FOR ICD-9 250.00, 250.01). 07/23/17  Yes [name], MD  gabapentin (NEURONTIN) 600 MG tablet Take 600 mg by mouth 3 (three) times daily.   Yes [provider]  ipratropium-albuterol (DUONEB) 0.5-2.5 (3) MG/3ML SOLN Take 3 mLs every 6 (six) hours as needed by nebulization (wheezing). 07/17/17  Yes [name], MD  linaclotide [facility]) 145 MCG CAPS capsule Take 145 mcg by mouth daily before breakfast.   Yes [provider]  lisinopril-hydrochlorothiazide (PRINZIDE,ZESTORETIC) 10-12.5 MG tablet Take 1 tablet by mouth daily.   Yes [provider]  metFORMIN (GLUCOPHAGE) 500 MG tablet Take 1 tablet (500 mg total) daily with breakfast by mouth. 07/23/17 07/23/18 Yes [name], MD  pantoprazole (PROTONIX) 40 MG tablet Take 1 tablet (40 mg total) by mouth 2 (two) times daily before a meal. 02/01/18  Yes [name], NP  tamsulosin (FLOMAX) 0.4 MG CAPS capsule Take 1 capsule by mouth daily. 12/18/17  Yes [provider]  TRELEGY ELLIPTA 100-62.5-25 MCG/INH AEPB Inhale 1 puff into the lungs daily.  05/04/17  Yes [provider]  meloxicam (MOBIC) 7.5 MG tablet Take 7.5 mg by mouth daily.     [provider]     Allergies:    No Known Allergies   Physical Exam:   Vitals  Blood pressure 122/86, pulse 78, temperature  3 F (36.7 C), temperature source Oral, resp. rate (!) 30, height _0  (1.6 m), weight 53.5 kg (118 lb), SpO2 96 %.  1.  General: Appears in no acute distress  2. Psychiatric:  Intact judgement and  insight, awake alert, oriented x 3.  3. Neurologic: No focal neurological deficits, all cranial nerves intact.Strength 5/5 all 4 extremities, sensation intact all 4 extremities, plantars down going.  4. Eyes :  anicteric sclerae, moist conjunctivae with no lid lag. PERRLA.  5. ENMT:  Oropharynx clear with moist mucous membranes and good dentition  6. Neck:  supple, no cervical lymphadenopathy appriciated, No thyromegaly  7. Respiratory : Normal respiratory effort, bilateral wheezing auscultated  8. Cardiovascular : RRR, no gallops, rubs or murmurs, no leg edema  9. Gastrointestinal:  Positive bowel sounds, abdomen soft, tender to palpation in epigastric region, no hepatosplenomegaly, no rigidity or guarding       10. Skin:  No cyanosis, normal texture and turgor, no rash, lesions or ulcers  11.Musculoskeletal:  Good muscle tone,  joints appear normal , no effusions,  normal range of motion    Data Review:    CBC Recent Labs  Lab 03/11/18 1833  WBC 8.6  HGB 13.3  HCT 39.5  PLT 241  MCV 89.0  MCH 30.0  MCHC 33.7  RDW 14.7   ------------------------------------------------------------------------------------------------------------------  Chemistries  Recent Labs  Lab 03/11/18 1833  NA 141  K 4.5  CL 105  CO2 29  GLUCOSE 129*  BUN 20  CREATININE 1.71*  CALCIUM 9.1    ------------------------------------------------------------------------------------------------------------------  ------------------------------------------------------------------------------------------------------------------ GFR: Estimated Creatinine Clearance: 34.3 mL/min (A) (by C-G formula based on SCr of 1.71 mg/dL (H)). Liver Function Tests: No results for input(s): AST, ALT, ALKPHOS, BILITOT, PROT, ALBUMIN in the last 168 hours. No results for input(s): LIPASE, AMYLASE in the last 168 hours. No results for input(s): AMMONIA in the last 168 hours. Coagulation Profile: No results for input(s): INR, PROTIME in the last 168 hours. Cardiac Enzymes: Recent Labs  Lab 03/11/18 1833 03/11/18 2109  TROPONINI <0.03 <0.03    --------------------------------------------------------------------------------------------------------------- Urine analysis:    Component Value Date/Time   COLORURINE STRAW (A) 10/06/2017 0942   APPEARANCEUR CLEAR 10/06/2017 0942   LABSPEC 1.004 (L) 10/06/2017 0942   PHURINE 6.0 10/06/2017 0942   GLUCOSEU NEGATIVE 10/06/2017 0942   HGBUR NEGATIVE 10/06/2017 0942   BILIRUBINUR NEGATIVE 10/06/2017 0942   KETONESUR NEGATIVE 10/06/2017 0942   PROTEINUR NEGATIVE 10/06/2017 0942   UROBILINOGEN 0.2 07/08/2015 1314   NITRITE NEGATIVE 10/06/2017 0942   LEUKOCYTESUR TRACE (A) 10/06/2017 0942      Imaging Results:    Dg Chest 2 View  Result Date: 03/11/2018 CLINICAL DATA:  62 y/o M; central nonradiating chest pain. Shortness of breath, dizziness, weakness. EXAM: CHEST - 2 VIEW COMPARISON:  07/22/2017 chest radiograph. FINDINGS: Stable cardiac silhouette given projection and technique. Stable enlarged central pulmonary arteries may represent pulmonary artery hypertension. No consolidation, effusion, or pneumothorax. Partially visualized anterior cervical fusion hardware. No acute osseous abnormality is evident. IMPRESSION: 1. No acute pulmonary process  identified. 2. Stable enlarged central pulmonary arteries may represent pulmonary artery hypertension. Electronically Signed   By: Kristine Garbe M.D.   On: 03/11/2018 19:51    My personal review of EKG: Rhythm NSR   Assessment & Plan:    Active Problems:   GERD   COPD exacerbation ([place])    1. COPD exacerbation-mild, will start Solu-Medrol 40 mg IV every 6 hours, albuterol and ipratropium nebulizers every  6 hours. 2. Epigastric pain-patient has ongoing work-up as outpatient with GI.  He was planned to have HIDA scan tomorrow morning.  Will order HIDA scan.  Keep him n.p.o. after midnight. 3. Leg cramps/paresthesia-likely neuropathic, patient is on gabapentin.  We will also check serum magnesium level. 4. Diabetes mellitus-hold metformin, will start sliding scale insulin with NovoLog. 5. Hyperlipidemia-continue Lipitor.   DVT Prophylaxis-   Lovenox   AM Labs Ordered, also please review Full Orders  Family Communication: Admission, patients condition and plan of care including tests being ordered have been discussed with the patient and family members at bedside who indicate understanding and agree with the plan and Code Status.  Code Status: Full code  Admission status: Observation  Time spent in minutes : 60 minutes   Oswald Hillock M.D on 03/11/2018 at 11:42 PM  Between 7am to 7pm - Pager - [phone]. After 7pm go to [email or web address] - password [facility]  [facility] - Office  [phone]

## 2018-03-11 NOTE — ED Provider Notes (Signed)
[facility] EMERGENCY DEPARTMENT Provider Note   CSN: [id number] Arrival date & time: 03/11/18  7614     History   Chief Complaint Chief Complaint  Patient presents with  . Chest Pain    HPI Bryan [name] is a 62 y.o. male.  HPI  62 year old male with multiple complaints.  Approximately 2 hours prior to arrival he was getting up from a seated position he states that his legs "locked up."  He says that he cannot move them and they were trembling.  He had a burning sensation in both of his legs from his hip slowly down to his feet.  This has been persistent since then.  He has a history of lower back pain with previous surgeries but denies any acute change in this.  No incontinence.  He says at the same time he was having epigastric pain and was feeling short of breath.  The pain has since improved but not completely resolved.  No fevers or chills.  Mild nausea.  No vomiting.  No unusual leg pain or swelling.  Past history of COPD.  He was previously on oxygen but states that he was told that he no longer needed it several months ago.  He was in his usual state of health earlier today.  Past Medical History:  Diagnosis Date  . Acid reflux   . Arthritis   . Asthma   . Chronic pain    with leg and back pain (disc problem)  . COPD (chronic obstructive pulmonary disease) ([place])   . Headache    HX OF  . History of upper GI x-ray series    to follow showed large duodenal ulcer H pylori serologies were negative  . Hypertension   . Pneumonia    07/17/17  . Pre-diabetes   . Stroke [facility])    TIA MINI STROKE  . Tubular adenoma     Patient Active Problem List   Diagnosis Date Noted  . BPH with obstruction/lower urinary tract symptoms 08/17/2017  . Left sided numbness 07/22/2017  . TIA (transient ischemic attack) 07/22/2017  . Pneumonia 07/14/2017  . COPD with acute exacerbation ([place]) 07/14/2017  . CAP (community acquired pneumonia) 07/14/2017  . Constipation 07/10/2017  . Hx of  adenomatous colonic polyps 05/01/2017  . Acute renal failure (ARF) ([place]) 05/16/2016  . Nausea with vomiting 05/16/2016  . Dehydration 05/16/2016  . Hypertension   . Chronic pain   . Heme positive stool 10/20/2014  . Abdominal pain, epigastric 06/19/2014  . Dysphagia 06/19/2014  . AP (abdominal pain) 11/13/2013  . Early satiety 11/13/2013  . COPD, severe ([place]) 02/22/2013  . Low back pain 11/19/2012  . COLITIS 05/12/2009  . ABDOMINAL PAIN, LEFT LOWER QUADRANT 05/12/2009  . DUODENAL ULCER, HX OF 05/12/2009  . ALCOHOL USE 05/11/2009  . GERD 05/11/2009  . Diarrhea 05/11/2009  . SPONDYLOSIS, CERVICAL 07/03/2007  . NECK PAIN 07/03/2007    Past Surgical History:  Procedure Laterality Date  . BACK SURGERY  7/05; 5/09    Dr.Hirsch,3 lumbar  X3  . BACK SURGERY    . COLONOSCOPY  Feb 2012   Dr. Gala Romney: normal rectum, pedunculate polyp removed but not recovered  . COLONOSCOPY WITH ESOPHAGOGASTRODUODENOSCOPY (EGD) N/A 11/18/2013   Dr.Rourk- tcs= normal rectum, multipe polyps about the ileocecal valve and distal transverse colon o/w the remainder of the colonic mucosa appeared normal bx= tubular adenoma. EGD= normal esophagus, stomach with scattered erosions mottling, friablility, no ulcer or infiltrating process patent pylorus bx= chronic  inflammation. next TCS 11/2016  . COLONOSCOPY WITH PROPOFOL N/A 05/25/2017   Dr. Gala Romney: sigmoid diverticulosis, one 4 mm hyerplastic rectal polyp, ascending colonic AVMs surveillance 2023  . ESOPHAGOGASTRODUODENOSCOPY  1/06   Dr. Volney American esophageal erosions,U-shaped stomach,marked erosions and edema of the bulb without discrete ulcer disease.   . ESOPHAGOGASTRODUODENOSCOPY (EGD) WITH PROPOFOL N/A 05/25/2017   Dr. Gala Romney: reflux esophagitis s/p empiric dilation, normal stomach and duodenum  . MALONEY DILATION N/A 05/25/2017   Procedure: Venia Minks DILATION;  Surgeon: Daneil Dolin, MD;  [facility];  Service: Endoscopy;  Laterality: N/A;  . NECK  SURGERY  10/2007   PLATE IN NECK  . POLYPECTOMY  05/25/2017   Procedure: POLYPECTOMY;  Surgeon: Daneil Dolin, MD;  Location[facility];  Service: Endoscopy;;  rectal  . TRANSURETHRAL RESECTION OF PROSTATE N/A 08/17/2017   Procedure: TRANSURETHRAL RESECTION OF THE PROSTATE (TURP);  Surgeon: Irine Seal, MD;  Location: [facility];  Service: Urology;  Laterality: N/A;        Home Medications    Prior to Admission medications   Medication Sig Start Date End Date Taking? Authorizing Provider  albuterol (PROVENTIL HFA;VENTOLIN HFA) 108 (90 Base) MCG/ACT inhaler Inhale 1-2 puffs every 4 (four) hours as needed into the lungs for wheezing or shortness of breath. Do not use with nebulizer Patient taking differently: Inhale 1 puff into the lungs every 4 (four) hours as needed for wheezing or shortness of breath. Do not use with nebulizer 07/17/17  Yes [name], MD  albuterol (PROVENTIL) (2.5 MG/3ML) 0.083% nebulizer solution Take 3 mLs (2.5 mg total) every 4 (four) hours as needed by nebulization for wheezing or shortness of breath. 07/17/17  Yes [name], MD  atorvastatin (LIPITOR) 40 MG tablet Take 1 tablet (40 mg total) daily by mouth. 07/23/17 07/23/18 Yes [name], MD  blood glucose meter kit and supplies Dispense based on patient and insurance preference. Use up to four times daily as directed. (FOR ICD-9 250.00, 250.01). 07/23/17  Yes [name], MD  gabapentin (NEURONTIN) 600 MG tablet Take 600 mg by mouth 3 (three) times daily.   Yes [provider]  ipratropium-albuterol (DUONEB) 0.5-2.5 (3) MG/3ML SOLN Take 3 mLs every 6 (six) hours as needed by nebulization (wheezing). 07/17/17  Yes [name], MD  linaclotide [facility]) 145 MCG CAPS capsule Take 145 mcg by mouth daily before breakfast.   Yes [provider]  lisinopril-hydrochlorothiazide (PRINZIDE,ZESTORETIC) 10-12.5 MG tablet Take 1 tablet by mouth daily.   Yes [provider]  metFORMIN (GLUCOPHAGE) 500 MG tablet Take 1 tablet (500 mg total) daily with breakfast by mouth. 07/23/17 07/23/18 Yes [name], MD  pantoprazole (PROTONIX) 40 MG tablet Take 1 tablet (40 mg total) by mouth 2 (two) times daily before a meal. 02/01/18  Yes [name], NP  tamsulosin (FLOMAX) 0.4 MG CAPS capsule Take 1 capsule by mouth daily. 12/18/17  Yes [provider]  TRELEGY ELLIPTA 100-62.5-25 MCG/INH AEPB Inhale 1 puff into the lungs daily.  05/04/17  Yes [provider]  meloxicam (MOBIC) 7.5 MG tablet Take 7.5 mg by mouth daily.     [provider]    Family History Family History  Problem Relation Age of Onset  . Cancer Father   . Asthma Mother   . Colon cancer Neg Hx     Social History Social History   Tobacco Use  . Smoking status: Former Smoker    Packs/day:  0.50    Years: 40.00    Pack years: 20.00    Types: Cigarettes    Last attempt to quit: 11/11/2014    Years since quitting: 3.3  . Smokeless tobacco: Never Used  Substance Use Topics  . Alcohol use: No    Alcohol/week: 0.0 oz  . Drug use: Yes    Types: Marijuana    Comment:  'every once in a while"      Allergies   Patient has no known allergies.   Review of Systems Review of Systems  All systems reviewed and negative, other than as noted in HPI.  Physical Exam Updated Vital Signs BP 126/63   Pulse 71   Temp 98 F (36.7 C) (Oral)   Resp (!) 28   Ht _0  (1.6 m)   Wt 53.5 kg (118 lb)   SpO2 98%   BMI 20.90 kg/m   Physical Exam  Constitutional: He appears well-developed and well-nourished. No distress.  HENT:  Head: Normocephalic and atraumatic.  Eyes: Conjunctivae are normal. Right eye exhibits no discharge. Left eye exhibits no discharge.  Neck: Neck supple.  Cardiovascular: Normal rate, regular rhythm and normal heart sounds. Exam reveals no gallop and no friction rub.  No murmur heard. Pulmonary/Chest: Effort normal and breath sounds  normal. No respiratory distress.  Abdominal: Soft. He exhibits no distension. There is no tenderness.  Musculoskeletal: He exhibits no edema or tenderness.  Neurological: He is alert.  Skin: Skin is warm and dry.  Psychiatric: He has a normal mood and affect. His behavior is normal. Thought content normal.  Nursing note and vitals reviewed.    ED Treatments / Results  Labs (all labs ordered are listed, but only abnormal results are displayed) Labs Reviewed  BASIC METABOLIC PANEL - Abnormal; Notable for the following components:      Result Value   Glucose, Bld 129 (*)    Creatinine, Ser 1.71 (*)    GFR calc non Af Amer 41 (*)    GFR calc Af Amer 48 (*)    All other components within normal limits  HEMOGLOBIN A1C - Abnormal; Notable for the following components:   Hgb A1c MFr Bld 6.9 (*)    All other components within normal limits  CBC - Abnormal; Notable for the following components:   Hemoglobin 12.8 (*)    All other components within normal limits  COMPREHENSIVE METABOLIC PANEL - Abnormal; Notable for the following components:   Glucose, Bld 110 (*)    Creatinine, Ser 1.40 (*)    GFR calc non Af Amer 53 (*)    All other components within normal limits  GLUCOSE, CAPILLARY - Abnormal; Notable for the following components:   Glucose-Capillary 116 (*)    All other components within normal limits  GLUCOSE, CAPILLARY - Abnormal; Notable for the following components:   Glucose-Capillary 203 (*)    All other components within normal limits  GLUCOSE, CAPILLARY - Abnormal; Notable for the following components:   Glucose-Capillary 148 (*)    All other components within normal limits  GLUCOSE, CAPILLARY - Abnormal; Notable for the following components:   Glucose-Capillary 176 (*)    All other components within normal limits  CBC  TROPONIN I  TROPONIN I  MAGNESIUM  GLUCOSE, CAPILLARY  TROPONIN I  TROPONIN I  TROPONIN I  BASIC METABOLIC PANEL    EKG EKG  Interpretation  Date/Time:  Sunday March 11 2018 18:30:41 EDT Ventricular Rate:  79 PR Interval:  QRS Duration: 84 QT Interval:  346 QTC Calculation: 397 R Axis:   89 Text Interpretation:  Sinus rhythm Consider left atrial enlargement Borderline right axis deviation Probable anteroseptal infarct, old Confirmed by Virgel Manifold [phone]) on 03/11/2018 9:59:17 PM   Radiology Dg Chest 2 View  Result Date: 03/11/2018 CLINICAL DATA:  62 y/o M; central nonradiating chest pain. Shortness of breath, dizziness, weakness. EXAM: CHEST - 2 VIEW COMPARISON:  07/22/2017 chest radiograph. FINDINGS: Stable cardiac silhouette given projection and technique. Stable enlarged central pulmonary arteries may represent pulmonary artery hypertension. No consolidation, effusion, or pneumothorax. Partially visualized anterior cervical fusion hardware. No acute osseous abnormality is evident. IMPRESSION: 1. No acute pulmonary process identified. 2. Stable enlarged central pulmonary arteries may represent pulmonary artery hypertension. Electronically Signed   By: Kristine Garbe M.D.   On: 03/11/2018 19:51    Procedures Procedures (including critical care time)  Medications Ordered in ED Medications  sodium chloride 0.9 % bolus 500 mL (0 mLs Intravenous Stopped 03/11/18 2126)  morphine 4 MG/ML injection 4 mg (4 mg Intravenous Given 03/11/18 2008)     Initial Impression / Assessment and Plan / ED Course  I have reviewed the triage vital signs and the nursing notes.  Pertinent labs & imaging results that were available during my care of the patient were reviewed by me and considered in my medical decision making (see chart for details).     62 year old male with burning pain in bilateral lower extremities.  His neurological examination is nonfocal.  He says this is acute but there is a note from 6/20 stating 8/10 leg pain making it difficult to walk. Epigastric pain also seems chronic in nature and he is  actually scheduled for a HIDA scan tomorrow.   He was feeling better.  Labs fairly unremarkable including troponin x2. EKG not changed from prior. He was actually discharged but when he was getting up from the wheelchair to get to his car he had the same symptoms again. Says his legs again locked up. He had increasing epigastric pain and felt SOB. He does have o2 sats in the high 80s on RA. Hx of COPD. Was previously on supplemental o2, but not anymore. Maybe some mild wheezing on exam. Will try   Final Clinical Impressions(s) / ED Diagnoses   Final diagnoses:  Chest pain, unspecified type    ED Discharge Orders    None       Virgel Manifold, MD 03/12/18 2312

## 2018-03-11 NOTE — ED Notes (Signed)
Pt walked to restroom with cane and then placed in wheelchair for ride out to lobby, upon entering lobby pt c/o shaking and sob, pt requesting to return to er, pt rolled to room 11, pulse ox 88% on RA, bp 159/83, hr 68, Dr Wilson Singer notified, pt placed on oxygen at 2lpm via [place] with improvement in pulse ox to 94%

## 2018-03-11 NOTE — ED Triage Notes (Signed)
Patient c/o central chest pain, non-radiating that started approx 2 hours ago. Patient states shortness of breath, dizziness, and weakness. Denies any nausea or vomiting. Denies any cardiac hx. Per patient hx of COPD in which he uses inhalers and neb treatments at home-last used yesterday. Patient's O2 sat 88% on room air-per patient used temporary oxygen at home but oxygen was taken away by Dr Luan Pulling a few months ago.

## 2018-03-12 ENCOUNTER — Observation Stay (HOSPITAL_COMMUNITY): Payer: Medicare HMO

## 2018-03-12 ENCOUNTER — Encounter (HOSPITAL_COMMUNITY)
Admission: RE | Admit: 2018-03-12 | Discharge: 2018-03-12 | Disposition: A | Discharge status: Home / Self Care | Payer: Medicare HMO | Source: Ambulatory Visit | Attending: Gastroenterology | Admitting: Gastroenterology

## 2018-03-12 ENCOUNTER — Encounter (HOSPITAL_COMMUNITY): Payer: Self-pay

## 2018-03-12 DIAGNOSIS — J441 Chronic obstructive pulmonary disease with (acute) exacerbation: Secondary | ICD-10-CM

## 2018-03-12 DIAGNOSIS — K210 Gastro-esophageal reflux disease with esophagitis: Secondary | ICD-10-CM | POA: Diagnosis not present

## 2018-03-12 DIAGNOSIS — R1013 Epigastric pain: Secondary | ICD-10-CM

## 2018-03-12 DIAGNOSIS — E1169 Type 2 diabetes mellitus with other specified complication: Secondary | ICD-10-CM | POA: Diagnosis present

## 2018-03-12 DIAGNOSIS — E785 Hyperlipidemia, unspecified: Secondary | ICD-10-CM | POA: Diagnosis not present

## 2018-03-12 DIAGNOSIS — K5900 Constipation, unspecified: Secondary | ICD-10-CM

## 2018-03-12 DIAGNOSIS — N179 Acute kidney failure, unspecified: Secondary | ICD-10-CM | POA: Diagnosis not present

## 2018-03-12 DIAGNOSIS — N138 Other obstructive and reflux uropathy: Secondary | ICD-10-CM

## 2018-03-12 DIAGNOSIS — N401 Enlarged prostate with lower urinary tract symptoms: Secondary | ICD-10-CM

## 2018-03-12 DIAGNOSIS — R1010 Upper abdominal pain, unspecified: Secondary | ICD-10-CM | POA: Diagnosis not present

## 2018-03-12 LAB — COMPREHENSIVE METABOLIC PANEL
ALT: 15 U/L (ref 0–44)
AST: 16 U/L (ref 15–41)
Albumin: 3.7 g/dL (ref 3.5–5.0)
Alkaline Phosphatase: 80 U/L (ref 38–126)
Anion gap: 6 (ref 5–15)
BUN: 18 mg/dL (ref 8–23)
CALCIUM: 8.9 mg/dL (ref 8.9–10.3)
CO2: 26 mmol/L (ref 22–32)
Chloride: 108 mmol/L (ref 98–111)
Creatinine, Ser: 1.4 mg/dL — ABNORMAL HIGH (ref 0.61–1.24)
GFR calc Af Amer: >60 mL/min (ref >60)
GFR calc non Af Amer: 53 mL/min — ABNORMAL LOW (ref >60)
GLUCOSE: 110 mg/dL — AB (ref 70–99)
POTASSIUM: 5.1 mmol/L (ref 3.5–5.1)
SODIUM: 140 mmol/L (ref 135–145)
TOTAL PROTEIN: 7 g/dL (ref 6.5–8.1)
Total Bilirubin: 0.5 mg/dL (ref 0.3–1.2)

## 2018-03-12 LAB — CBC
HCT: 39.4 % (ref 39.0–52.0)
Hemoglobin: 12.8 g/dL — ABNORMAL LOW (ref 13.0–17.0)
MCH: 29.4 pg (ref 26.0–34.0)
MCHC: 32.5 g/dL (ref 30.0–36.0)
MCV: 90.4 fL (ref 78.0–100.0)
PLATELETS: 240 10*3/uL (ref 150–400)
RBC: 4.36 MIL/uL (ref 4.22–5.81)
RDW: 14.9 % (ref 11.5–15.5)
WBC: 10.4 10*3/uL (ref 4.0–10.5)

## 2018-03-12 LAB — GLUCOSE, CAPILLARY
GLUCOSE-CAPILLARY: 116 mg/dL — AB (ref 70–99)
GLUCOSE-CAPILLARY: 148 mg/dL — AB (ref 70–99)
GLUCOSE-CAPILLARY: 203 mg/dL — AB (ref 70–99)
GLUCOSE-CAPILLARY: 96 mg/dL (ref 70–99)
Glucose-Capillary: 176 mg/dL — ABNORMAL HIGH (ref 70–99)

## 2018-03-12 LAB — TROPONIN I: Troponin I: <0.03 ng/mL (ref <0.03)

## 2018-03-12 LAB — HEMOGLOBIN A1C
Hgb A1c MFr Bld: 6.9 % — ABNORMAL HIGH (ref 4.8–5.6)
MEAN PLASMA GLUCOSE: 151.33 mg/dL

## 2018-03-12 LAB — MAGNESIUM: MAGNESIUM: 1.7 mg/dL (ref 1.7–2.4)

## 2018-03-12 MED ORDER — METHYLPREDNISOLONE SODIUM SUCC 40 MG IJ SOLR
40.0000 mg | Freq: Four times a day (QID) | INTRAMUSCULAR | Status: DC
  Administered 2018-03-12 (×4): 40 mg via INTRAVENOUS
  Filled 2018-03-12 (×4): qty 1

## 2018-03-12 MED ORDER — PREDNISONE 20 MG PO TABS
40.0000 mg | ORAL_TABLET | Freq: Every day | ORAL | Status: DC
  Administered 2018-03-13: 40 mg via ORAL
  Filled 2018-03-12: qty 2

## 2018-03-12 MED ORDER — GI COCKTAIL ~~LOC~~: 30.0000 mL | Freq: Three times a day (TID) | ORAL | Status: DC | PRN

## 2018-03-12 MED ORDER — ACETAMINOPHEN 325 MG PO TABS: 650.0000 mg | ORAL_TABLET | Freq: Four times a day (QID) | ORAL | Status: DC | PRN

## 2018-03-12 MED ORDER — INSULIN ASPART 100 UNIT/ML ~~LOC~~ SOLN: 0.0000 [IU] | Freq: Every day | SUBCUTANEOUS | Status: DC

## 2018-03-12 MED ORDER — INSULIN ASPART 100 UNIT/ML ~~LOC~~ SOLN
0.0000 [IU] | Freq: Four times a day (QID) | SUBCUTANEOUS | Status: DC
  Administered 2018-03-12: 3 [IU] via SUBCUTANEOUS
  Administered 2018-03-12: 1 [IU] via SUBCUTANEOUS

## 2018-03-12 MED ORDER — IPRATROPIUM BROMIDE 0.02 % IN SOLN: 0.5000 mg | Freq: Four times a day (QID) | RESPIRATORY_TRACT | Status: DC

## 2018-03-12 MED ORDER — INSULIN ASPART 100 UNIT/ML ~~LOC~~ SOLN
0.0000 [IU] | Freq: Three times a day (TID) | SUBCUTANEOUS | Status: DC
  Administered 2018-03-13: 2 [IU] via SUBCUTANEOUS

## 2018-03-12 MED ORDER — ENOXAPARIN SODIUM 40 MG/0.4ML ~~LOC~~ SOLN
40.0000 mg | SUBCUTANEOUS | Status: DC
  Administered 2018-03-13: 40 mg via SUBCUTANEOUS
  Filled 2018-03-12 (×2): qty 0.4

## 2018-03-12 MED ORDER — ACETAMINOPHEN 650 MG RE SUPP: 650.0000 mg | Freq: Four times a day (QID) | RECTAL | Status: DC | PRN

## 2018-03-12 MED ORDER — IPRATROPIUM-ALBUTEROL 0.5-2.5 (3) MG/3ML IN SOLN: 3.0000 mL | Freq: Four times a day (QID) | RESPIRATORY_TRACT | Status: DC | PRN

## 2018-03-12 MED ORDER — LISINOPRIL 5 MG PO TABS
10.0000 mg | ORAL_TABLET | Freq: Every day | ORAL | Status: DC
  Administered 2018-03-12 – 2018-03-13 (×2): 10 mg via ORAL
  Filled 2018-03-12 (×2): qty 2

## 2018-03-12 MED ORDER — TECHNETIUM TC 99M MEBROFENIN IV KIT
5.0000 | PACK | Freq: Once | INTRAVENOUS | Status: AC | PRN
  Administered 2018-03-12: 5.5 via INTRAVENOUS

## 2018-03-12 MED ORDER — GABAPENTIN 300 MG PO CAPS
600.0000 mg | ORAL_CAPSULE | Freq: Three times a day (TID) | ORAL | Status: DC
Start: 2018-03-12 — End: 2018-03-13
  Administered 2018-03-12 – 2018-03-13 (×4): 600 mg via ORAL
  Filled 2018-03-12 (×4): qty 2

## 2018-03-12 MED ORDER — GABAPENTIN 600 MG PO TABS
600.0000 mg | ORAL_TABLET | Freq: Three times a day (TID) | ORAL | Status: DC
  Filled 2018-03-12: qty 1

## 2018-03-12 MED ORDER — IPRATROPIUM-ALBUTEROL 0.5-2.5 (3) MG/3ML IN SOLN
2.5000 mg | Freq: Four times a day (QID) | RESPIRATORY_TRACT | Status: DC
  Administered 2018-03-12: 2.5 mg via RESPIRATORY_TRACT
  Administered 2018-03-12: 3 mg via RESPIRATORY_TRACT
  Filled 2018-03-12 (×3): qty 3

## 2018-03-12 MED ORDER — HYDROCHLOROTHIAZIDE 12.5 MG PO CAPS
12.5000 mg | ORAL_CAPSULE | Freq: Every day | ORAL | Status: DC
  Administered 2018-03-12: 12.5 mg via ORAL
  Filled 2018-03-12: qty 1

## 2018-03-12 MED ORDER — SODIUM CHLORIDE 0.9 % IV SOLN
INTRAVENOUS | Status: DC
  Administered 2018-03-12: 01:00:00 via INTRAVENOUS

## 2018-03-12 MED ORDER — PANTOPRAZOLE SODIUM 40 MG PO TBEC
40.0000 mg | DELAYED_RELEASE_TABLET | Freq: Two times a day (BID) | ORAL | Status: DC
  Administered 2018-03-12 – 2018-03-13 (×3): 40 mg via ORAL
  Filled 2018-03-12 (×3): qty 1

## 2018-03-12 MED ORDER — ATORVASTATIN CALCIUM 40 MG PO TABS
40.0000 mg | ORAL_TABLET | Freq: Every day | ORAL | Status: DC
  Administered 2018-03-12 – 2018-03-13 (×2): 40 mg via ORAL
  Filled 2018-03-12 (×2): qty 1

## 2018-03-12 MED ORDER — SODIUM CHLORIDE 0.9 % IV SOLN
INTRAVENOUS | Status: DC
  Administered 2018-03-12 – 2018-03-13 (×2): via INTRAVENOUS

## 2018-03-12 MED ORDER — IPRATROPIUM-ALBUTEROL 0.5-2.5 (3) MG/3ML IN SOLN
3.0000 mL | Freq: Three times a day (TID) | RESPIRATORY_TRACT | Status: DC
  Administered 2018-03-12 – 2018-03-13 (×2): 3 mL via RESPIRATORY_TRACT
  Filled 2018-03-12: qty 3

## 2018-03-12 MED ORDER — LINACLOTIDE 145 MCG PO CAPS
145.0000 ug | ORAL_CAPSULE | Freq: Every day | ORAL | Status: DC
  Administered 2018-03-13: 145 ug via ORAL
  Filled 2018-03-12: qty 1

## 2018-03-12 MED ORDER — TAMSULOSIN HCL 0.4 MG PO CAPS
0.4000 mg | ORAL_CAPSULE | Freq: Every day | ORAL | Status: DC
  Administered 2018-03-12 – 2018-03-13 (×2): 0.4 mg via ORAL
  Filled 2018-03-12 (×2): qty 1

## 2018-03-12 NOTE — Care Management Obs Status (Signed)
[place] NOTIFICATION   Patient Details  Name: Bryan [name] MRN: [id number] Date of Birth: Aug 18, 1956   Medicare Observation Status Notification Given:  Yes    Sherald Barge, RN 03/12/2018, 1:11 PM

## 2018-03-12 NOTE — Progress Notes (Signed)
Patient is asleep and resting on 2 lpm oxygen. Will start nebs at 4 to 5 am when he has to be awakened or before. He is admitted for epigastric pain

## 2018-03-12 NOTE — Progress Notes (Signed)
Spoke with patients nurse 3a room 338.  Patient has gallbladder, patient npo since midnight. Scan to be done at 8am along with Scheduled study per RN and Dr. Darrick Meigs, patient admitted from ER.

## 2018-03-12 NOTE — Progress Notes (Signed)
Bryan [name]  RXV:[id number] DOB: 11-18-1955 DOA: 03/11/2018 PCP: Rosita Fire, MD    Brief Narrative:  62 year old male with a history of COPD, presents to the hospital with complaints of epigastric discomfort.  He is being followed by GI as an outpatient for this.  He was found to be hypoxic in the emergency room with oxygen saturations at 88%.  It was felt that he may have a mild COPD exacerbation.  He was admitted for further management of COPD as well as work-up of epigastric discomfort.   Assessment & Plan:   Active Problems:   GERD   Epigastric pain   Constipation   BPH with obstruction/lower urinary tract symptoms   COPD exacerbation (HCC)   Type 2 diabetes mellitus with hyperlipidemia ([place])   AKI (acute kidney injury) ([place])   1. COPD exacerbation.  Mild.  Will transition from Solu-Medrol to prednisone taper.  Continue bronchodilators.  This appears to be improving. 2. Epigastric pain.  HIDA scan done that did not show any biliary abnormalities.  Possibly related to GERD.  He is chronically on NSAIDs.  Continue PPI.  Add GI cocktail.  Since he had recurrent symptoms, will cycle troponins. 3. Diabetes.  Holding metformin.  Continue on sliding scale insulin.  Monitor blood sugars in the setting was steroids. 4. Hyperlipidemia.  Continue on statin. 5. BPH.  Continue on Flomax. 6. Constipation.  Will continue on Linzess. 7. Acute kidney injury.  Possibly related to dehydration.  Hydrochlorothiazide currently on hold.  Improving with hydration.  Recheck labs in a.m.   DVT prophylaxis: Lovenox Code Status: Full code Family Communication: No family present Disposition Plan: Discharge home when improved   Consultants:     Procedures:     Antimicrobials:       Subjective: Feels her breathing is improving.  Wheezing improving.  He has not had any recurrence of epigastric discomfort.  Objective: Vitals:   03/12/18 0605 03/12/18 1335 03/12/18 1459  03/12/18 1937  BP: 135/73 134/76    Pulse: 71 81    Resp: (!) 22 18    Temp: 98.2 F (36.8 C) 98.4 F (36.9 C)    TempSrc: Oral Oral    SpO2: 96% 94% 94% 91%  Weight:      Height:        Intake/Output Summary (Last 24 hours) at 03/12/2018 2040 Last data filed at 03/12/2018 1800 Gross per 24 hour  Intake 1108.5 ml  Output 700 ml  Net 408.5 ml   Filed Weights   03/11/18 1835 03/12/18 0030  Weight: 53.5 kg (118 lb) 52.5 kg (115 lb 12.8 oz)    Examination:  General exam: Appears calm and comfortable  Respiratory system: Diminished breath sounds bilaterally. Respiratory effort normal. Cardiovascular system: S1 & S2 heard, RRR. No JVD, murmurs, rubs, gallops or clicks. No pedal edema. Gastrointestinal system: Abdomen is nondistended, soft and nontender. No organomegaly or masses felt. Normal bowel sounds heard. Central nervous system: Alert and oriented. No focal neurological deficits. Extremities: Symmetric 5 x 5 power. Skin: No rashes, lesions or ulcers Psychiatry: Judgement and insight appear normal. Mood & affect appropriate.     Data Reviewed: I have personally reviewed following labs and imaging studies  CBC: Recent Labs  Lab 03/11/18 1833 03/12/18 0438  WBC 8.6 10.4  HGB 13.3 12.8*  HCT 39.5 39.4  MCV 89.0 90.4  PLT 241 509   Basic Metabolic Panel: Recent Labs  Lab 03/11/18 1833 03/12/18 0438  NA  141 140  K 4.5 5.1  CL 105 108  CO2 29 26  GLUCOSE 129* 110*  BUN 20 18  CREATININE 1.71* 1.40*  CALCIUM 9.1 8.9  MG 1.7  --    GFR: Estimated Creatinine Clearance: 41.1 mL/min (A) (by C-G formula based on SCr of 1.4 mg/dL (H)). Liver Function Tests: Recent Labs  Lab 03/12/18 0438  AST 16  ALT 15  ALKPHOS 80  BILITOT 0.5  PROT 7.0  ALBUMIN 3.7   No results for input(s): LIPASE, AMYLASE in the last 168 hours. No results for input(s): AMMONIA in the last 168 hours. Coagulation Profile: No results for input(s): INR, PROTIME in the last 168  hours. Cardiac Enzymes: Recent Labs  Lab 03/11/18 1833 03/11/18 2109  TROPONINI <0.03 <0.03   BNP (last 3 results) No results for input(s): PROBNP in the last 8760 hours. HbA1C: Recent Labs    03/12/18 0438  HGBA1C 6.9*   CBG: Recent Labs  Lab 03/12/18 0054 03/12/18 0602 03/12/18 1113 03/12/18 1654  GLUCAP 96 116* 203* 148*   Lipid Profile: No results for input(s): CHOL, HDL, LDLCALC, TRIG, CHOLHDL, LDLDIRECT in the last 72 hours. Thyroid Function Tests: No results for input(s): TSH, T4TOTAL, FREET4, T3FREE, THYROIDAB in the last 72 hours. Anemia Panel: No results for input(s): VITAMINB12, FOLATE, FERRITIN, TIBC, IRON, RETICCTPCT in the last 72 hours. Sepsis Labs: No results for input(s): PROCALCITON, LATICACIDVEN in the last 168 hours.  No results found for this or any previous visit (from the past 240 hour(s)).       Radiology Studies: Dg Chest 2 View  Result Date: 03/11/2018 CLINICAL DATA:  62 y/o M; central nonradiating chest pain. Shortness of breath, dizziness, weakness. EXAM: CHEST - 2 VIEW COMPARISON:  07/22/2017 chest radiograph. FINDINGS: Stable cardiac silhouette given projection and technique. Stable enlarged central pulmonary arteries may represent pulmonary artery hypertension. No consolidation, effusion, or pneumothorax. Partially visualized anterior cervical fusion hardware. No acute osseous abnormality is evident. IMPRESSION: 1. No acute pulmonary process identified. 2. Stable enlarged central pulmonary arteries may represent pulmonary artery hypertension. Electronically Signed   By: Kristine Garbe M.D.   On: 03/11/2018 19:51   Nm Hepato W/eject Fract  Result Date: 03/12/2018 CLINICAL DATA:  Upper abdominal pain EXAM: NUCLEAR MEDICINE HEPATOBILIARY IMAGING WITH GALLBLADDER EF VIEWS: Sequential images of the abdomen were obtained out to 60 minutes following intravenous administration of anterior right upper quadrant. Radiotracer: 5.5 mCi Tc-58m  Choletec IV COMPARISON:  None. FINDINGS: Liver uptake of radiotracer is normal. There is prompt visualization of gallbladder and small bowel, indicating patency of the cystic and common bile ducts. The patient consumed 8 ounces of Ensure Plus orally with calculation of the computer generated ejection fraction of radiotracer from the gallbladder. The patient did not experience clinical symptoms with the oral Ensure Plus consumption. The computer generated ejection fraction of radiotracer from the gallbladder is normal at 92%, normal greater than 33% using the oral agent. IMPRESSION: Study within normal limits. Electronically Signed   By: WLowella GripIII M.D.   On: 03/12/2018 09:45        Scheduled Meds: . atorvastatin  40 mg Oral Daily  . enoxaparin (LOVENOX) injection  40 mg Subcutaneous Q24H  . gabapentin  600 mg Oral TID  . [START ON 03/13/2018] insulin aspart  0-15 Units Subcutaneous TID WC  . insulin aspart  0-5 Units Subcutaneous QHS  . ipratropium-albuterol  3 mL Nebulization TID  . [START ON 03/13/2018] linaclotide  145 mcg  Oral QAC breakfast  . lisinopril  10 mg Oral Daily  . pantoprazole  40 mg Oral BID AC  . [START ON 03/13/2018] predniSONE  40 mg Oral Q breakfast  . tamsulosin  0.4 mg Oral Daily   Continuous Infusions: . sodium chloride 10 mL/hr at 03/12/18 0109  . sodium chloride       LOS: 0 days    Time spent: 25 minutes    [name], MD [facility] Pager [phone]  If 7PM-7AM, please contact night-coverage [email or web address] Password [email or web address] 03/12/2018, 8:40 PM

## 2018-03-13 ENCOUNTER — Observation Stay (HOSPITAL_BASED_OUTPATIENT_CLINIC_OR_DEPARTMENT_OTHER): Payer: Medicare HMO

## 2018-03-13 DIAGNOSIS — R1013 Epigastric pain: Secondary | ICD-10-CM | POA: Diagnosis not present

## 2018-03-13 DIAGNOSIS — E1169 Type 2 diabetes mellitus with other specified complication: Secondary | ICD-10-CM | POA: Diagnosis not present

## 2018-03-13 DIAGNOSIS — N401 Enlarged prostate with lower urinary tract symptoms: Secondary | ICD-10-CM | POA: Diagnosis not present

## 2018-03-13 DIAGNOSIS — K5900 Constipation, unspecified: Secondary | ICD-10-CM | POA: Diagnosis not present

## 2018-03-13 DIAGNOSIS — J441 Chronic obstructive pulmonary disease with (acute) exacerbation: Secondary | ICD-10-CM | POA: Diagnosis not present

## 2018-03-13 DIAGNOSIS — I361 Nonrheumatic tricuspid (valve) insufficiency: Secondary | ICD-10-CM

## 2018-03-13 DIAGNOSIS — N138 Other obstructive and reflux uropathy: Secondary | ICD-10-CM | POA: Diagnosis not present

## 2018-03-13 DIAGNOSIS — E785 Hyperlipidemia, unspecified: Secondary | ICD-10-CM | POA: Diagnosis not present

## 2018-03-13 DIAGNOSIS — K210 Gastro-esophageal reflux disease with esophagitis: Secondary | ICD-10-CM | POA: Diagnosis not present

## 2018-03-13 DIAGNOSIS — N179 Acute kidney failure, unspecified: Secondary | ICD-10-CM | POA: Diagnosis not present

## 2018-03-13 LAB — BASIC METABOLIC PANEL
Anion gap: 8 (ref 5–15)
BUN: 22 mg/dL (ref 8–23)
CALCIUM: 9 mg/dL (ref 8.9–10.3)
CHLORIDE: 106 mmol/L (ref 98–111)
CO2: 24 mmol/L (ref 22–32)
Creatinine, Ser: 1.55 mg/dL — ABNORMAL HIGH (ref 0.61–1.24)
GFR calc Af Amer: 54 mL/min — ABNORMAL LOW (ref >60)
GFR calc non Af Amer: 47 mL/min — ABNORMAL LOW (ref >60)
Glucose, Bld: 140 mg/dL — ABNORMAL HIGH (ref 70–99)
Potassium: 4.5 mmol/L (ref 3.5–5.1)
SODIUM: 138 mmol/L (ref 135–145)

## 2018-03-13 LAB — ECHOCARDIOGRAM COMPLETE
HEIGHTINCHES: 63 in
WEIGHTICAEL: 1852.8 [oz_av]

## 2018-03-13 LAB — GLUCOSE, CAPILLARY
GLUCOSE-CAPILLARY: 131 mg/dL — AB (ref 70–99)
Glucose-Capillary: 120 mg/dL — ABNORMAL HIGH (ref 70–99)

## 2018-03-13 LAB — TROPONIN I: Troponin I: <0.03 ng/mL (ref <0.03)

## 2018-03-13 MED ORDER — IPRATROPIUM-ALBUTEROL 0.5-2.5 (3) MG/3ML IN SOLN: 3.0000 mL | Freq: Two times a day (BID) | RESPIRATORY_TRACT | Status: DC

## 2018-03-13 MED ORDER — PREDNISONE 10 MG PO TABS: ORAL_TABLET | ORAL | 0 refills | Status: DC

## 2018-03-13 MED ORDER — AMLODIPINE BESYLATE 5 MG PO TABS: 5.0000 mg | ORAL_TABLET | Freq: Every day | ORAL | 11 refills | Status: DC

## 2018-03-13 NOTE — Progress Notes (Signed)
SATURATION QUALIFICATIONS: (This note is used to comply with regulatory documentation for home oxygen)  Patient Saturations on Room Air at Rest = 95%  Patient Saturations on Room Air while Ambulating = 92%

## 2018-03-13 NOTE — Discharge Summary (Signed)
Physician Discharge Summary  Bryan [name]:[id number] DOB: 1956/01/22 DOA: 03/11/2018  PCP: Rosita Fire, MD  Admit date: 03/11/2018 Discharge date: 03/13/2018  Admitted From: Home Disposition: Home  Recommendations for Outpatient Follow-up:  1. Follow up with PCP in 1-2 weeks 2. Please obtain BMP/CBC in one week 3. Follow-up with GI as an outpatient for further evaluation of epigastric discomfort  Discharge Condition: Stable CODE STATUS: Full code Diet recommendation: Heart healthy, carb modified  Brief/Interim Summary: 62 year old male with history of COPD, presented with epigastric discomfort.  He ruled out for ACS with negative cardiac markers.  Echocardiogram was also unremarkable.  I suspect his discomfort may be related to GI cause.  He had a HIDA that was found to be negative.  Review of his medications indicate that he was taking NSAIDs prior to admission.  These were held in the hospital.  He is continued on PPI.  He does report improvement of his pain and has not had any recurrence since admission.  He should follow-up with GI for further management.  He also was noted to be hypoxic on admission.  It was felt that he has mild COPD exacerbation.  He was treated with intravenous steroids, bronchodilators.  Respiratory status has improved.  He is able to ambulate on room air without any difficulty.  Wheezing has resolved.  Steroids have been transitioned to prednisone taper.  We will continue bronchodilators at home.  Respiratory status appears to be back to baseline.  Discharge Diagnoses:  Active Problems:   GERD   Epigastric pain   Constipation   BPH with obstruction/lower urinary tract symptoms   COPD exacerbation (HCC)   Type 2 diabetes mellitus with hyperlipidemia ([place])   AKI (acute kidney injury) [facility])    Discharge Instructions  Discharge Instructions    Diet - low sodium heart healthy   Complete by:  As directed    Increase activity slowly   Complete by:  As  directed      Allergies as of 03/13/2018   No Known Allergies     Medication List    STOP taking these medications   lisinopril-hydrochlorothiazide 10-12.5 MG tablet Commonly known as:  PRINZIDE,ZESTORETIC   meloxicam 7.5 MG tablet Commonly known as:  MOBIC     TAKE these medications   albuterol (2.5 MG/3ML) 0.083% nebulizer solution Commonly known as:  PROVENTIL Take 3 mLs (2.5 mg total) every 4 (four) hours as needed by nebulization for wheezing or shortness of breath. What changed:  Another medication with the same name was changed. Make sure you understand how and when to take each.   albuterol 108 (90 Base) MCG/ACT inhaler Commonly known as:  PROVENTIL HFA;VENTOLIN HFA Inhale 1-2 puffs every 4 (four) hours as needed into the lungs for wheezing or shortness of breath. Do not use with nebulizer What changed:    how much to take  additional instructions   amLODipine 5 MG tablet Commonly known as:  NORVASC Take 1 tablet (5 mg total) by mouth daily.   atorvastatin 40 MG tablet Commonly known as:  LIPITOR Take 1 tablet (40 mg total) daily by mouth.   blood glucose meter kit and supplies Dispense based on patient and insurance preference. Use up to four times daily as directed. (FOR ICD-9 250.00, 250.01).   gabapentin 600 MG tablet Commonly known as:  NEURONTIN Take 600 mg by mouth 3 (three) times daily.   ipratropium-albuterol 0.5-2.5 (3) MG/3ML Soln Commonly known as:  DUONEB Take 3 mLs every 6 (  six) hours as needed by nebulization (wheezing).   LINZESS 145 MCG Caps capsule Generic drug:  linaclotide Take 145 mcg by mouth daily before breakfast.   metFORMIN 500 MG tablet Commonly known as:  GLUCOPHAGE Take 1 tablet (500 mg total) daily with breakfast by mouth.   pantoprazole 40 MG tablet Commonly known as:  PROTONIX Take 1 tablet (40 mg total) by mouth 2 (two) times daily before a meal.   predniSONE 10 MG tablet Commonly known as:  DELTASONE Take 48m  po daily for 2 days then 326mdaily for 2 days then 2023maily for 2 days then 45m98mily for 2 days then stop   tamsulosin 0.4 MG Caps capsule Commonly known as:  FLOMAX Take 1 capsule by mouth daily.   TRELEGY ELLIPTA 100-62.5-25 MCG/INH Aepb Generic drug:  Fluticasone-Umeclidin-Vilant Inhale 1 puff into the lungs daily.      Follow-up Information    FantRosita Fire.   Specialty:  Internal Medicine Contact information: [phone]      No Known Allergies  Consultations:     Procedures/Studies: Dg Chest 2 View  Result Date: 03/11/2018 CLINICAL DATA:  61 y2 M; central nonradiating chest pain. Shortness of breath, dizziness, weakness. EXAM: CHEST - 2 VIEW COMPARISON:  07/22/2017 chest radiograph. FINDINGS: Stable cardiac silhouette given projection and technique. Stable enlarged central pulmonary arteries may represent pulmonary artery hypertension. No consolidation, effusion, or pneumothorax. Partially visualized anterior cervical fusion hardware. No acute osseous abnormality is evident. IMPRESSION: 1. No acute pulmonary process identified. 2. Stable enlarged central pulmonary arteries may represent pulmonary artery hypertension. Electronically Signed   By: LancKristine Garbe.   On: 03/11/2018 19:51   Dg Esophagus  Result Date: 02/27/2018 CLINICAL DATA:  Dysphagia, epigastric pain for 2 years, recent nausea, vomiting and diarrhea EXAM: ESOPHOGRAM / BARIUM SWALLOW / BARIUM TABLET STUDY TECHNIQUE: Combined double contrast and single contrast examination performed using effervescent crystals, thick barium liquid, and thin barium liquid. The patient was observed with fluoroscopy swallowing a 13 mm barium sulphate tablet. FLUOROSCOPY TIME:  Fluoroscopy Time:  1 minutes 18 seconds Radiation Exposure Index (if provided by the fluoroscopic device): 12.6 mGy Number of Acquired Spot Images: multiple fluoroscopic screen  captures COMPARISON:  None FINDINGS: Esophageal distention: Normal distention without mass or stricture Filling defects:  None 12.5 mm barium tablet: Rapidly passed from oral cavity to stomach without obstruction Motility:  Normal for age Mucosa:  Smooth without irregularity or ulceration Hypopharynx/cervical esophagus: Laryngeal penetration to the level of the vocal cords. No aspiration. No spontaneous cough. Minimal residuals. Residuals and contrast within the laryngeal vestibule to were cleared by a voluntary cough Hiatal hernia:  Absent GE reflux:  Not witnessed during exam Other:  Prior cervical spine fusion C4-C7. IMPRESSION: Laryngeal penetration to the level of the vocal cords without aspiration. Otherwise negative exam. Electronically Signed   By: MarkLavonia Dana.   On: 02/27/2018 10:05   Nm Hepato W/eject Fract  Result Date: 03/12/2018 CLINICAL DATA:  Upper abdominal pain EXAM: NUCLEAR MEDICINE HEPATOBILIARY IMAGING WITH GALLBLADDER EF VIEWS: Sequential images of the abdomen were obtained out to 60 minutes following intravenous administration of anterior right upper quadrant. Radiotracer: 5.5 mCi Tc-30m 51mletec IV COMPARISON:  None. FINDINGS: Liver uptake of radiotracer is normal. There is prompt visualization of gallbladder and small bowel, indicating patency of the cystic and common bile ducts. The patient consumed 8 ounces of Ensure Plus orally with  calculation of the computer generated ejection fraction of radiotracer from the gallbladder. The patient did not experience clinical symptoms with the oral Ensure Plus consumption. The computer generated ejection fraction of radiotracer from the gallbladder is normal at 92%, normal greater than 33% using the oral agent. IMPRESSION: Study within normal limits. Electronically Signed   By: Lowella Grip III M.D.   On: 03/12/2018 09:45    Echo: - Left ventricle: The cavity size was normal. Wall thickness was   normal. Systolic function was normal.  The estimated ejection   fraction was in the range of 60% to 65%. Wall motion was normal;   there were no regional wall motion abnormalities. Left   ventricular diastolic function parameters were normal. - Aortic valve: Mildly calcified annulus. Trileaflet. There was no   stenosis. - Atrial septum: No defect or patent foramen ovale was identified. - Tricuspid valve: There was moderate regurgitation. - Pulmonary arteries: Systolic pressure was moderately increased.   PA peak pressure: 49 mm Hg (S).   Subjective: Feeling better.  No recurrence of epigastric discomfort.  No shortness of breath.  Discharge Exam: Vitals:   03/13/18 1245 03/13/18 1337  BP:  127/70  Pulse:  77  Resp:  16  Temp:  98.6 F (37 C)  SpO2: 92% 94%   Vitals:   03/13/18 0553 03/13/18 0737 03/13/18 1245 03/13/18 1337  BP: (!) 155/89   127/70  Pulse: 81   77  Resp: 20   16  Temp: 98.5 F (36.9 C)   98.6 F (37 C)  TempSrc: Oral   Oral  SpO2: 96% 96% 92% 94%  Weight:      Height:        General: Pt is alert, awake, not in acute distress Cardiovascular: RRR, S1/S2 +, no rubs, no gallops Respiratory: CTA bilaterally, no wheezing, no rhonchi Abdominal: Soft, NT, ND, bowel sounds + Extremities: no edema, no cyanosis    The results of significant diagnostics from this hospitalization (including imaging, microbiology, ancillary and laboratory) are listed below for reference.     Microbiology: No results found for this or any previous visit (from the past 240 hour(s)).   Labs: BNP (last 3 results) Recent Labs    07/14/17 1655  BNP 161.0*   Basic Metabolic Panel: Recent Labs  Lab 03/11/18 1833 03/12/18 0438 03/13/18 0224  NA 141 140 138  K 4.5 5.1 4.5  CL 105 108 106  CO2 _0 GLUCOSE 129* 110* 140*  BUN _1 CREATININE 1.71* 1.40* 1.55*  CALCIUM 9.1 8.9 9.0  MG 1.7  --   --    Liver Function Tests: Recent Labs  Lab 03/12/18 0438  AST 16  ALT 15  ALKPHOS 80  BILITOT  0.5  PROT 7.0  ALBUMIN 3.7   No results for input(s): LIPASE, AMYLASE in the last 168 hours. No results for input(s): AMMONIA in the last 168 hours. CBC: Recent Labs  Lab 03/11/18 1833 03/12/18 0438  WBC 8.6 10.4  HGB 13.3 12.8*  HCT 39.5 39.4  MCV 89.0 90.4  PLT 241 240   Cardiac Enzymes: Recent Labs  Lab 03/11/18 1833 03/11/18 2109 03/12/18 2050 03/13/18 0224 03/13/18 0850  TROPONINI <0.03 <0.03 <0.03 <0.03 <0.03   BNP: Invalid input(s): POCBNP CBG: Recent Labs  Lab 03/12/18 1113 03/12/18 1654 03/12/18 2305 03/13/18 0552 03/13/18 1126  GLUCAP 203* 148* 176* 120* 131*   D-Dimer No results for input(s): DDIMER in the last 72 hours.  Hgb A1c Recent Labs    03/12/18 0438  HGBA1C 6.9*   Lipid Profile No results for input(s): CHOL, HDL, LDLCALC, TRIG, CHOLHDL, LDLDIRECT in the last 72 hours. Thyroid function studies No results for input(s): TSH, T4TOTAL, T3FREE, THYROIDAB in the last 72 hours.  Invalid input(s): FREET3 Anemia work up No results for input(s): VITAMINB12, FOLATE, FERRITIN, TIBC, IRON, RETICCTPCT in the last 72 hours. Urinalysis    Component Value Date/Time   COLORURINE STRAW (A) 10/06/2017 0942   APPEARANCEUR CLEAR 10/06/2017 0942   LABSPEC 1.004 (L) 10/06/2017 0942   PHURINE 6.0 10/06/2017 0942   GLUCOSEU NEGATIVE 10/06/2017 0942   HGBUR NEGATIVE 10/06/2017 0942   BILIRUBINUR NEGATIVE 10/06/2017 0942   KETONESUR NEGATIVE 10/06/2017 0942   PROTEINUR NEGATIVE 10/06/2017 0942   UROBILINOGEN 0.2 07/08/2015 1314   NITRITE NEGATIVE 10/06/2017 0942   LEUKOCYTESUR TRACE (A) 10/06/2017 0942   Sepsis Labs Invalid input(s): PROCALCITONIN,  WBC,  LACTICIDVEN Microbiology No results found for this or any previous visit (from the past 240 hour(s)).   Time coordinating discharge: 47mns  SIGNED:   JKathie Dike MD  [facility] 03/13/2018, 2:39 PM Pager   If 7PM-7AM, please contact night-coverage [email or web address] Password  [email or web address]

## 2018-03-13 NOTE — Progress Notes (Signed)
*  PRELIMINARY RESULTS* Echocardiogram 2D Echocardiogram has been performed.  Leavy Cella 03/13/2018, 10:19 AM

## 2018-03-13 NOTE — Care Management Note (Signed)
Case Management Note  Patient Details  Name: Bryan [name] MRN: [id number] Date of Birth: 1956/03/09  Subjective/Objective:                  Admitted with COPD. Pt from home, ind with ADL's. Has PCP, transportation and insurance with drug coverage. Pt has neb machine, does not meet criteria for supplemental oxygen.    Action/Plan: DC home today with self care. No CM needs noted at this time.   Expected Discharge Date:  03/14/18               Expected Discharge Plan:  Home/Self Care  In-House Referral:  NA  Discharge planning Services  CM Consult  Post Acute Care Choice:  NA Choice offered to:  NA  Status of Service:  Completed, signed off Sherald Barge, RN 03/13/2018, 1:36 PM

## 2018-03-13 NOTE — Progress Notes (Signed)
Bryan [name] discharged Home per MD order.  Discharge instructions reviewed and discussed with the patient, all questions and concerns answered. Copy of instructions and scripts given to patient.  Allergies as of 03/13/2018   No Known Allergies     Medication List    STOP taking these medications   lisinopril-hydrochlorothiazide 10-12.5 MG tablet Commonly known as:  PRINZIDE,ZESTORETIC   meloxicam 7.5 MG tablet Commonly known as:  MOBIC     TAKE these medications   albuterol (2.5 MG/3ML) 0.083% nebulizer solution Commonly known as:  PROVENTIL Take 3 mLs (2.5 mg total) every 4 (four) hours as needed by nebulization for wheezing or shortness of breath. What changed:  Another medication with the same name was changed. Make sure you understand how and when to take each.   albuterol 108 (90 Base) MCG/ACT inhaler Commonly known as:  PROVENTIL HFA;VENTOLIN HFA Inhale 1-2 puffs every 4 (four) hours as needed into the lungs for wheezing or shortness of breath. Do not use with nebulizer What changed:    how much to take  additional instructions   amLODipine 5 MG tablet Commonly known as:  NORVASC Take 1 tablet (5 mg total) by mouth daily.   atorvastatin 40 MG tablet Commonly known as:  LIPITOR Take 1 tablet (40 mg total) daily by mouth.   blood glucose meter kit and supplies Dispense based on patient and insurance preference. Use up to four times daily as directed. (FOR ICD-9 250.00, 250.01).   gabapentin 600 MG tablet Commonly known as:  NEURONTIN Take 600 mg by mouth 3 (three) times daily.   ipratropium-albuterol 0.5-2.5 (3) MG/3ML Soln Commonly known as:  DUONEB Take 3 mLs every 6 (six) hours as needed by nebulization (wheezing).   LINZESS 145 MCG Caps capsule Generic drug:  linaclotide Take 145 mcg by mouth daily before breakfast.   metFORMIN 500 MG tablet Commonly known as:  GLUCOPHAGE Take 1 tablet (500 mg total) daily with breakfast by mouth.   pantoprazole 40 MG  tablet Commonly known as:  PROTONIX Take 1 tablet (40 mg total) by mouth 2 (two) times daily before a meal.   predniSONE 10 MG tablet Commonly known as:  DELTASONE Take 15m po daily for 2 days then 347mdaily for 2 days then 2062maily for 2 days then 65m57mily for 2 days then stop   tamsulosin 0.4 MG Caps capsule Commonly known as:  FLOMAX Take 1 capsule by mouth daily.   TRELEGY ELLIPTA 100-62.5-25 MCG/INH Aepb Generic drug:  Fluticasone-Umeclidin-Vilant Inhale 1 puff into the lungs daily.       Patients skin is clean, dry and intact, no evidence of skin break down. IV site discontinued and catheter remains intact. Site without signs and symptoms of complications. Dressing and pressure applied.  Patient escorted to car in a wheelchair,  no distress noted upon discharge.  [name] 03/13/2018 7:26 PM

## 2018-03-15 DIAGNOSIS — J449 Chronic obstructive pulmonary disease, unspecified: Secondary | ICD-10-CM | POA: Diagnosis not present

## 2018-03-15 DIAGNOSIS — J441 Chronic obstructive pulmonary disease with (acute) exacerbation: Secondary | ICD-10-CM | POA: Diagnosis not present

## 2018-03-15 DIAGNOSIS — R0602 Shortness of breath: Secondary | ICD-10-CM | POA: Diagnosis not present

## 2018-03-15 DIAGNOSIS — J4520 Mild intermittent asthma, uncomplicated: Secondary | ICD-10-CM | POA: Diagnosis not present

## 2018-03-16 ENCOUNTER — Ambulatory Visit: Payer: Medicare HMO | Admitting: Urology

## 2018-03-19 ENCOUNTER — Ambulatory Visit (INDEPENDENT_AMBULATORY_CARE_PROVIDER_SITE_OTHER): Payer: Medicare HMO | Admitting: Gastroenterology

## 2018-03-19 ENCOUNTER — Encounter: Payer: Self-pay | Admitting: Gastroenterology

## 2018-03-19 VITALS — BP 119/74 | HR 91 | Temp 99.5°F | Ht 63.0 in | Wt 115.8 lb

## 2018-03-19 DIAGNOSIS — K5900 Constipation, unspecified: Secondary | ICD-10-CM

## 2018-03-19 DIAGNOSIS — R1013 Epigastric pain: Secondary | ICD-10-CM

## 2018-03-19 MED ORDER — PANTOPRAZOLE SODIUM 40 MG PO TBEC: 40.0000 mg | DELAYED_RELEASE_TABLET | Freq: Two times a day (BID) | ORAL | 5 refills | Status: DC

## 2018-03-19 NOTE — Patient Instructions (Signed)
I am giving you samples of Linzess that is the smallest dose. Take this in the morning on an empty stomach, 30 minutes before breakfast.  I sent Protonix to your pharmacy to take twice a day, 30 minutes before breakfast and dinner. Let me know how you are doing in about 1-2 weeks!  I will see you back in 4-6 weeks for close follow-up.   [name], PhD, ANP-BC [facility]

## 2018-03-19 NOTE — Progress Notes (Signed)
Referring Provider: Rosita Fire, MD Primary Care Physician:  Rosita Fire, MD  Primary GI: Dr. Gala Romney   Chief Complaint  Patient presents with  . Dysphagia  . RUQ/chest pain    was hospitalized last week for this problem. Last few days he has been doing okay    HPI:   Bryan [name] is a 62 y.o. male presenting today with a history of chronic abdominal pain, last seen May 2019. He reported dysphagia despite empiric dilation in Sept 2018. Known esophagitis. BPE completed without motility issues. No stricture. Due to abdominal pain, HIDA completed and normal with EF 92%. CT without contrast May 2019 with wall thickening throughout sigmoid without focal inflammatory changes, possibly due to underdistension, unable to exclude low grade colitis. I discussed with radiologist and low likelihood of infectious process. Pancreas unremarkable on non-contrast exam. He was hospitalized in July due to abdominal/chest pain. Negative cardiac markers.    Epigastric pain is chronic. No worsening with eating. No N/V. Denies esophageal dysphagia. Taking Protonix once a day. Sometimes walking and has to grab his abdomen.   Constipation: resolved. On Linzess 145 mcg. Sometimes has loose stool with this.   Weight: 115 today. He was also 115 in Nov 2018. Weight fluctuates between 115-120.   Past Medical History:  Diagnosis Date  . Acid reflux   . Arthritis   . Asthma   . Chronic pain    with leg and back pain (disc problem)  . COPD (chronic obstructive pulmonary disease) ([place])   . Diabetes mellitus without complication ([place])   . Headache    HX OF  . History of upper GI x-ray series    to follow showed large duodenal ulcer H pylori serologies were negative  . Hypertension   . Pneumonia    07/17/17  . Pre-diabetes   . Stroke [facility])    TIA MINI STROKE  . Tubular adenoma     Past Surgical History:  Procedure Laterality Date  . BACK SURGERY  7/05; 5/09    Dr.Hirsch,3 lumbar  X3  . BACK SURGERY     . COLONOSCOPY  Feb 2012   Dr. Gala Romney: normal rectum, pedunculate polyp removed but not recovered  . COLONOSCOPY WITH ESOPHAGOGASTRODUODENOSCOPY (EGD) N/A 11/18/2013   Dr.Rourk- tcs= normal rectum, multipe polyps about the ileocecal valve and distal transverse colon o/w the remainder of the colonic mucosa appeared normal bx= tubular adenoma. EGD= normal esophagus, stomach with scattered erosions mottling, friablility, no ulcer or infiltrating process patent pylorus bx= chronic inflammation. next TCS 11/2016  . COLONOSCOPY WITH PROPOFOL N/A 05/25/2017   Dr. Gala Romney: sigmoid diverticulosis, one 4 mm hyerplastic rectal polyp, ascending colonic AVMs surveillance 2023  . ESOPHAGOGASTRODUODENOSCOPY  1/06   Dr. Volney American esophageal erosions,U-shaped stomach,marked erosions and edema of the bulb without discrete ulcer disease.   . ESOPHAGOGASTRODUODENOSCOPY (EGD) WITH PROPOFOL N/A 05/25/2017   Dr. Gala Romney: reflux esophagitis s/p empiric dilation, normal stomach and duodenum  . MALONEY DILATION N/A 05/25/2017   Procedure: Venia Minks DILATION;  Surgeon: Daneil Dolin, MD;  Location: [facility];  Service: Endoscopy;  Laterality: N/A;  . NECK SURGERY  10/2007   PLATE IN NECK  . POLYPECTOMY  05/25/2017   Procedure: POLYPECTOMY;  Surgeon: Daneil Dolin, MD;  Location: [facility];  Service: Endoscopy;;  rectal  . TRANSURETHRAL RESECTION OF PROSTATE N/A 08/17/2017   Procedure: TRANSURETHRAL RESECTION OF THE PROSTATE (TURP);  Surgeon: Irine Seal, MD;  Location: [facility] ORS;  Service: Urology;  Laterality:  N/A;    Current Outpatient Medications  Medication Sig Dispense Refill  . albuterol (PROVENTIL HFA;VENTOLIN HFA) 108 (90 Base) MCG/ACT inhaler Inhale 1-2 puffs every 4 (four) hours as needed into the lungs for wheezing or shortness of breath. Do not use with nebulizer (Patient taking differently: Inhale 1 puff into the lungs every 4 (four) hours as needed for wheezing or shortness of breath. Do not use with  nebulizer) 1 Inhaler 0  . albuterol (PROVENTIL) (2.5 MG/3ML) 0.083% nebulizer solution Take 3 mLs (2.5 mg total) every 4 (four) hours as needed by nebulization for wheezing or shortness of breath. 75 mL 0  . amLODipine (NORVASC) 5 MG tablet Take 1 tablet (5 mg total) by mouth daily. 30 tablet 11  . atorvastatin (LIPITOR) 40 MG tablet Take 1 tablet (40 mg total) daily by mouth. 30 tablet 11  . blood glucose meter kit and supplies Dispense based on patient and insurance preference. Use up to four times daily as directed. (FOR ICD-9 250.00, 250.01). 1 each 0  . gabapentin (NEURONTIN) 600 MG tablet Take 600 mg by mouth 3 (three) times daily.    Marland Kitchen ipratropium-albuterol (DUONEB) 0.5-2.5 (3) MG/3ML SOLN Take 3 mLs every 6 (six) hours as needed by nebulization (wheezing). 360 mL 0  . metFORMIN (GLUCOPHAGE) 500 MG tablet Take 1 tablet (500 mg total) daily with breakfast by mouth. 30 tablet 0  . pantoprazole (PROTONIX) 40 MG tablet Take 1 tablet (40 mg total) by mouth 2 (two) times daily before a meal. (Patient taking differently: Take 40 mg by mouth daily. ) 60 tablet 3  . predniSONE (DELTASONE) 10 MG tablet Take 48m po daily for 2 days then 39mdaily for 2 days then 2060maily for 2 days then 48m77mily for 2 days then stop 20 tablet 0  . tamsulosin (FLOMAX) 0.4 MG CAPS capsule Take 1 capsule by mouth daily.    . TRELEGY ELLIPTA 100-62.5-25 MCG/INH AEPB Inhale 1 puff into the lungs daily.     . liMarland Kitchenaclotide (LINZESS) 145 MCG CAPS capsule Take 145 mcg by mouth daily before breakfast.     No current facility-administered medications for this visit.     Allergies as of 03/19/2018  . (No Known Allergies)    Family History  Problem Relation Age of Onset  . Cancer Father   . Asthma Mother   . Colon cancer Neg Hx     Social History   Socioeconomic History  . Marital status: Single    Spouse name: Not on file  . Number of children: 2  . Years of education: Not on file  . Highest education level:  Not on file  Occupational History  . 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 Financial resource strain: Not on file  . Food insecurity:    Worry: Not on file    Inability: Not on file  . Transportation needs:    Medical: Not on file    Non-medical: Not on file  Tobacco Use  . Smoking status: Former Smoker    Packs/day: 0.50    Years: 40.00    Pack years: 20.00    Types: Cigarettes    Last attempt to quit: 11/11/2014    Years since quitting: 3.3  . Smokeless tobacco: Never Used  Substance and Sexual Activity  . Alcohol use: No    Alcohol/week: 0.0 oz  . Drug use: Yes    Types: Marijuana    Comment:  'every once in  a while"   . Sexual activity: Not Currently    Birth control/protection: None  Lifestyle  . Physical activity:    Days per week: Not on file    Minutes per session: Not on file  . Stress: Not on file  Relationships  . Social connections:    Talks on phone: Not on file    Gets together: Not on file    Attends religious service: Not on file    Active member of club or organization: Not on file    Attends meetings of clubs or organizations: Not on file    Relationship status: Not on file  Other Topics Concern  . Not on file  Social History Narrative   Lives with girlfriend, is on disability for history of back injuries.    Review of Systems: Gen: Denies fever, chills, anorexia. Denies fatigue, weakness, weight loss.  CV: Denies chest pain, palpitations, syncope, peripheral edema, and claudication. Resp: Denies dyspnea at rest, cough, wheezing, coughing up blood, and pleurisy. GI: see HPI  Derm: Denies rash, itching, dry skin Psych: Denies depression, anxiety, memory loss, confusion. No homicidal or suicidal ideation.  Heme: Denies bruising, bleeding, and enlarged lymph nodes.  Physical Exam: BP 119/74   Pulse 91   Temp 99.5 F (37.5 C) (Oral)   Ht 5' 3" (1.6 m)   Wt 115 lb 12.8 oz (52.5 kg)   BMI 20.51 kg/m  General:   Alert  and oriented. No distress noted. Pleasant and cooperative.  Head:  Normocephalic and atraumatic. Eyes:  Conjuctiva clear without scleral icterus. Mouth:  Oral mucosa pink and moist.  Abdomen:  +BS, soft, non-tender and non-distended. No rebound or guarding. No HSM or masses noted. Msk:  Symmetrical without gross deformities. Normal posture. Extremities:  Without edema. Neurologic:  Alert and  oriented x4 Psych:  Alert and cooperative. Normal mood and affect.

## 2018-03-21 DIAGNOSIS — R7302 Impaired glucose tolerance (oral): Secondary | ICD-10-CM | POA: Diagnosis not present

## 2018-03-21 DIAGNOSIS — R100 Acute abdomen: Secondary | ICD-10-CM | POA: Diagnosis not present

## 2018-03-21 DIAGNOSIS — F17200 Nicotine dependence, unspecified, uncomplicated: Secondary | ICD-10-CM | POA: Diagnosis not present

## 2018-03-21 DIAGNOSIS — K219 Gastro-esophageal reflux disease without esophagitis: Secondary | ICD-10-CM | POA: Diagnosis not present

## 2018-03-21 DIAGNOSIS — N401 Enlarged prostate with lower urinary tract symptoms: Secondary | ICD-10-CM | POA: Diagnosis not present

## 2018-03-21 DIAGNOSIS — R1013 Epigastric pain: Secondary | ICD-10-CM | POA: Diagnosis not present

## 2018-03-21 DIAGNOSIS — R300 Dysuria: Secondary | ICD-10-CM | POA: Diagnosis not present

## 2018-03-21 DIAGNOSIS — I10 Essential (primary) hypertension: Secondary | ICD-10-CM | POA: Diagnosis not present

## 2018-03-21 DIAGNOSIS — N390 Urinary tract infection, site not specified: Secondary | ICD-10-CM | POA: Diagnosis not present

## 2018-03-21 DIAGNOSIS — N179 Acute kidney failure, unspecified: Secondary | ICD-10-CM | POA: Diagnosis not present

## 2018-03-23 NOTE — Assessment & Plan Note (Signed)
Decrease Linzess to 72 mcg once daily. Call with update. Return for close follow-up in 4-6 weeks.

## 2018-03-23 NOTE — Assessment & Plan Note (Signed)
Chronic. No association with eating. EGD recently on file. Gallbladder present but HIDA normal. Doubt biliary etiology. Increase Protonix to BID. Weight is stable; will need to monitor closely for any signs of postprandial abdominal pain and further weight loss that would raise suspicion for vasculature etiology. Close follow-up in 4-6 weeks.

## 2018-03-27 NOTE — Progress Notes (Signed)
cc'ed to pcp °

## 2018-04-04 ENCOUNTER — Other Ambulatory Visit: Payer: Self-pay

## 2018-04-04 NOTE — Patient Outreach (Signed)
[facility]) Care Management  [place]  04/04/2018   Bryan [name] February 28, 1956 [id number]  Subjective: Successful telephone call to the patient.  HIPAA verified The patient states that he is dong fair.  He states that he is having pain with his legs.  He rates the pain at 10/10.  It is chronic with a burning sensation.  He states that it affects his walking.  The patient denies any falls but he does stumble and is able to catch himself.  I discussed fall precautions and he verbalized understanding.   He has spoken with his physician about the problem and it is felt that it may be coming from back.  He states that he is being scheduled to see a specialist.  He did not check his blood sugar today but yesterday is was 96. He is adherent with his medications and tries to exercise by walking.  I advised the patient to be careful in this heat and to keep himself hydrated. Patient verbalized understanding.  He is scheduled to follow up on his diabetes next month.  Encounter Medications:  Outpatient Encounter Medications as of 04/04/2018  Medication Sig Note  . albuterol (PROVENTIL HFA;VENTOLIN HFA) 108 (90 Base) MCG/ACT inhaler Inhale 1-2 puffs every 4 (four) hours as needed into the lungs for wheezing or shortness of breath. Do not use with nebulizer (Patient taking differently: Inhale 1 puff into the lungs every 4 (four) hours as needed for wheezing or shortness of breath. Do not use with nebulizer)   . albuterol (PROVENTIL) (2.5 MG/3ML) 0.083% nebulizer solution Take 3 mLs (2.5 mg total) every 4 (four) hours as needed by nebulization for wheezing or shortness of breath.   Marland Kitchen amLODipine (NORVASC) 5 MG tablet Take 1 tablet (5 mg total) by mouth daily.   Marland Kitchen atorvastatin (LIPITOR) 40 MG tablet Take 1 tablet (40 mg total) daily by mouth.   . blood glucose meter kit and supplies Dispense based on patient and insurance preference. Use up to four times daily as directed. (FOR ICD-9 250.00,  250.01).   Marland Kitchen gabapentin (NEURONTIN) 600 MG tablet Take 600 mg by mouth 3 (three) times daily. 03/01/2018: Dose reduced to 300 mg per patient   . ipratropium-albuterol (DUONEB) 0.5-2.5 (3) MG/3ML SOLN Take 3 mLs every 6 (six) hours as needed by nebulization (wheezing).   Marland Kitchen linaclotide (LINZESS) 145 MCG CAPS capsule Take 145 mcg by mouth daily before breakfast.   . metFORMIN (GLUCOPHAGE) 500 MG tablet Take 1 tablet (500 mg total) daily with breakfast by mouth.   . pantoprazole (PROTONIX) 40 MG tablet Take 1 tablet (40 mg total) by mouth 2 (two) times daily before a meal.   . tamsulosin (FLOMAX) 0.4 MG CAPS capsule Take 1 capsule by mouth daily.   . TRELEGY ELLIPTA 100-62.5-25 MCG/INH AEPB Inhale 1 puff into the lungs daily.    . predniSONE (DELTASONE) 10 MG tablet Take 68m po daily for 2 days then 359mdaily for 2 days then 2021maily for 2 days then 7m64mily for 2 days then stop (Patient not taking: Reported on 04/04/2018)    No facility-administered encounter medications on file as of 04/04/2018.     Functional Status:  In your present state of health, do you have any difficulty performing the following activities: 03/12/2018 08/17/2017  Hearing? N -  Vision? N -  Comment - -  Difficulty concentrating or making decisions? N -  Walking or climbing stairs? N -  Comment - -  Dressing or bathing? N -  Doing errands, shopping? Y N  Comment - -  Some recent data might be hidden    Fall/Depression Screening: Fall Risk  04/04/2018 03/01/2018 02/08/2018  Falls in the past year? No No No   PHQ 2/9 Scores 08/09/2017  PHQ - 2 Score 0    Assessment: Patient will continue to benefit from health coach outreach for disease management and support.  [facility] CM Care Plan Problem One     Most Recent Value  [facility] Long Term Goal   In 90 days the patient will verbalize that he has maintained his a1c at 6.5  [facility] Long Term Goal Start Date  04/04/18  Interventions for Problem One Long Term Goal  Reviewed cbg  record,  discussed dietary intake,  reviewed and discussed medication management  [facility] CM Short Term Goal #1   In 30 days the patient will verbalize that he has discussed his concern of burning and tingling in his legs with his physician  [facility] CM Short Term Goal #1 Start Date  04/04/18  [facility] CM Short Term Goal #1 Met Date  04/04/18     Plan: RN Health Coach will contact patient in the month of August and patient agrees to next outreach.  [name] RN, BSN, [place] Direct Dial:  [phone]  Fax: [phone]

## 2018-04-14 ENCOUNTER — Emergency Department (HOSPITAL_COMMUNITY): Payer: Medicare HMO

## 2018-04-14 ENCOUNTER — Encounter (HOSPITAL_COMMUNITY): Payer: Self-pay | Admitting: Emergency Medicine

## 2018-04-14 ENCOUNTER — Other Ambulatory Visit: Payer: Self-pay

## 2018-04-14 ENCOUNTER — Observation Stay (HOSPITAL_COMMUNITY)
Admission: EM | Admit: 2018-04-14 | Discharge: 2018-04-16 | Disposition: A | Discharge status: Home / Self Care | Payer: Medicare HMO | Attending: Internal Medicine | Admitting: Internal Medicine

## 2018-04-14 DIAGNOSIS — R1013 Epigastric pain: Secondary | ICD-10-CM

## 2018-04-14 DIAGNOSIS — E785 Hyperlipidemia, unspecified: Secondary | ICD-10-CM | POA: Diagnosis not present

## 2018-04-14 DIAGNOSIS — I10 Essential (primary) hypertension: Secondary | ICD-10-CM | POA: Diagnosis not present

## 2018-04-14 DIAGNOSIS — E119 Type 2 diabetes mellitus without complications: Secondary | ICD-10-CM | POA: Insufficient documentation

## 2018-04-14 DIAGNOSIS — N183 Chronic kidney disease, stage 3 unspecified: Secondary | ICD-10-CM

## 2018-04-14 DIAGNOSIS — Z79899 Other long term (current) drug therapy: Secondary | ICD-10-CM | POA: Diagnosis not present

## 2018-04-14 DIAGNOSIS — Z87891 Personal history of nicotine dependence: Secondary | ICD-10-CM | POA: Diagnosis not present

## 2018-04-14 DIAGNOSIS — R0602 Shortness of breath: Secondary | ICD-10-CM | POA: Diagnosis not present

## 2018-04-14 DIAGNOSIS — Z8673 Personal history of transient ischemic attack (TIA), and cerebral infarction without residual deficits: Secondary | ICD-10-CM | POA: Insufficient documentation

## 2018-04-14 DIAGNOSIS — J441 Chronic obstructive pulmonary disease with (acute) exacerbation: Principal | ICD-10-CM | POA: Insufficient documentation

## 2018-04-14 DIAGNOSIS — R911 Solitary pulmonary nodule: Secondary | ICD-10-CM | POA: Diagnosis not present

## 2018-04-14 DIAGNOSIS — R079 Chest pain, unspecified: Secondary | ICD-10-CM | POA: Diagnosis not present

## 2018-04-14 DIAGNOSIS — E1169 Type 2 diabetes mellitus with other specified complication: Secondary | ICD-10-CM | POA: Diagnosis present

## 2018-04-14 DIAGNOSIS — J9601 Acute respiratory failure with hypoxia: Secondary | ICD-10-CM | POA: Diagnosis not present

## 2018-04-14 DIAGNOSIS — R109 Unspecified abdominal pain: Secondary | ICD-10-CM | POA: Diagnosis not present

## 2018-04-14 LAB — URINALYSIS, ROUTINE W REFLEX MICROSCOPIC
Bilirubin Urine: NEGATIVE
GLUCOSE, UA: NEGATIVE mg/dL
HGB URINE DIPSTICK: NEGATIVE
Ketones, ur: NEGATIVE mg/dL
LEUKOCYTES UA: NEGATIVE
Nitrite: NEGATIVE
PROTEIN: NEGATIVE mg/dL
Specific Gravity, Urine: 1.013 (ref 1.005–1.030)
pH: 7 (ref 5.0–8.0)

## 2018-04-14 LAB — GLUCOSE, CAPILLARY: Glucose-Capillary: 202 mg/dL — ABNORMAL HIGH (ref 70–99)

## 2018-04-14 LAB — CBG MONITORING, ED: Glucose-Capillary: 171 mg/dL — ABNORMAL HIGH (ref 70–99)

## 2018-04-14 LAB — CBC
HCT: 37.8 % — ABNORMAL LOW (ref 39.0–52.0)
Hemoglobin: 12.2 g/dL — ABNORMAL LOW (ref 13.0–17.0)
MCH: 28.8 pg (ref 26.0–34.0)
MCHC: 32.3 g/dL (ref 30.0–36.0)
MCV: 89.4 fL (ref 78.0–100.0)
Platelets: 369 10*3/uL (ref 150–400)
RBC: 4.23 MIL/uL (ref 4.22–5.81)
RDW: 14.3 % (ref 11.5–15.5)
WBC: 7.6 10*3/uL (ref 4.0–10.5)

## 2018-04-14 LAB — COMPREHENSIVE METABOLIC PANEL
ALBUMIN: 3.6 g/dL (ref 3.5–5.0)
ALT: 13 U/L (ref 0–44)
ANION GAP: 7 (ref 5–15)
AST: 24 U/L (ref 15–41)
Alkaline Phosphatase: 86 U/L (ref 38–126)
BUN: 13 mg/dL (ref 8–23)
CALCIUM: 8.8 mg/dL — AB (ref 8.9–10.3)
CO2: 28 mmol/L (ref 22–32)
Chloride: 105 mmol/L (ref 98–111)
Creatinine, Ser: 1.35 mg/dL — ABNORMAL HIGH (ref 0.61–1.24)
GFR calc non Af Amer: 55 mL/min — ABNORMAL LOW (ref >60)
GLUCOSE: 155 mg/dL — AB (ref 70–99)
POTASSIUM: 4.1 mmol/L (ref 3.5–5.1)
SODIUM: 140 mmol/L (ref 135–145)
TOTAL PROTEIN: 7.4 g/dL (ref 6.5–8.1)
Total Bilirubin: 0.6 mg/dL (ref 0.3–1.2)

## 2018-04-14 LAB — D-DIMER, QUANTITATIVE: D-Dimer, Quant: 0.57 ug/mL-FEU — ABNORMAL HIGH (ref 0.00–0.50)

## 2018-04-14 LAB — LIPASE, BLOOD: Lipase: 55 U/L — ABNORMAL HIGH (ref 11–51)

## 2018-04-14 LAB — TROPONIN I

## 2018-04-14 LAB — BRAIN NATRIURETIC PEPTIDE: B NATRIURETIC PEPTIDE 5: 36 pg/mL (ref 0.0–100.0)

## 2018-04-14 MED ORDER — MOMETASONE FURO-FORMOTEROL FUM 200-5 MCG/ACT IN AERO
1.0000 | INHALATION_SPRAY | Freq: Two times a day (BID) | RESPIRATORY_TRACT | Status: DC
  Administered 2018-04-15 – 2018-04-16 (×2): 1 via RESPIRATORY_TRACT
  Filled 2018-04-14 (×2): qty 8.8

## 2018-04-14 MED ORDER — GABAPENTIN 300 MG PO CAPS
300.0000 mg | ORAL_CAPSULE | Freq: Three times a day (TID) | ORAL | Status: DC
  Administered 2018-04-15 – 2018-04-16 (×5): 300 mg via ORAL
  Filled 2018-04-14 (×5): qty 1

## 2018-04-14 MED ORDER — AMLODIPINE BESYLATE 5 MG PO TABS
5.0000 mg | ORAL_TABLET | Freq: Every day | ORAL | Status: DC
  Administered 2018-04-15 – 2018-04-16 (×2): 5 mg via ORAL
  Filled 2018-04-14 (×2): qty 1

## 2018-04-14 MED ORDER — GI COCKTAIL ~~LOC~~
30.0000 mL | Freq: Once | ORAL | Status: AC
  Administered 2018-04-14: 30 mL via ORAL
  Filled 2018-04-14: qty 30

## 2018-04-14 MED ORDER — INSULIN GLARGINE 100 UNIT/ML ~~LOC~~ SOLN
10.0000 [IU] | Freq: Every day | SUBCUTANEOUS | Status: DC
  Administered 2018-04-14 – 2018-04-15 (×2): 10 [IU] via SUBCUTANEOUS
  Filled 2018-04-14 (×3): qty 0.1

## 2018-04-14 MED ORDER — METHYLPREDNISOLONE SODIUM SUCC 125 MG IJ SOLR
125.0000 mg | Freq: Once | INTRAMUSCULAR | Status: AC
  Administered 2018-04-14: 125 mg via INTRAVENOUS
  Filled 2018-04-14: qty 2

## 2018-04-14 MED ORDER — INSULIN ASPART 100 UNIT/ML ~~LOC~~ SOLN
0.0000 [IU] | Freq: Three times a day (TID) | SUBCUTANEOUS | Status: DC
  Administered 2018-04-15: 3 [IU] via SUBCUTANEOUS
  Administered 2018-04-15: 2 [IU] via SUBCUTANEOUS

## 2018-04-14 MED ORDER — FAMOTIDINE 20 MG PO TABS
20.0000 mg | ORAL_TABLET | Freq: Two times a day (BID) | ORAL | Status: DC
  Administered 2018-04-15 – 2018-04-16 (×3): 20 mg via ORAL
  Filled 2018-04-14 (×3): qty 1

## 2018-04-14 MED ORDER — ALBUTEROL SULFATE (2.5 MG/3ML) 0.083% IN NEBU
5.0000 mg | INHALATION_SOLUTION | Freq: Once | RESPIRATORY_TRACT | Status: AC
  Administered 2018-04-14: 5 mg via RESPIRATORY_TRACT
  Filled 2018-04-14: qty 6

## 2018-04-14 MED ORDER — IOPAMIDOL (ISOVUE-370) INJECTION 76%
75.0000 mL | Freq: Once | INTRAVENOUS | Status: AC | PRN
  Administered 2018-04-14: 75 mL via INTRAVENOUS

## 2018-04-14 MED ORDER — TAMSULOSIN HCL 0.4 MG PO CAPS
0.4000 mg | ORAL_CAPSULE | Freq: Every day | ORAL | Status: DC
  Administered 2018-04-15 – 2018-04-16 (×2): 0.4 mg via ORAL
  Filled 2018-04-14 (×2): qty 1

## 2018-04-14 MED ORDER — PREDNISONE 20 MG PO TABS
40.0000 mg | ORAL_TABLET | Freq: Every day | ORAL | Status: DC
  Administered 2018-04-16: 40 mg via ORAL
  Filled 2018-04-14: qty 2

## 2018-04-14 MED ORDER — ALBUTEROL SULFATE (2.5 MG/3ML) 0.083% IN NEBU: 2.5000 mg | INHALATION_SOLUTION | RESPIRATORY_TRACT | Status: DC | PRN

## 2018-04-14 MED ORDER — PANTOPRAZOLE SODIUM 40 MG PO TBEC
40.0000 mg | DELAYED_RELEASE_TABLET | Freq: Two times a day (BID) | ORAL | Status: DC
  Administered 2018-04-15 – 2018-04-16 (×3): 40 mg via ORAL
  Filled 2018-04-14 (×3): qty 1

## 2018-04-14 MED ORDER — ATORVASTATIN CALCIUM 40 MG PO TABS
40.0000 mg | ORAL_TABLET | Freq: Every day | ORAL | Status: DC
  Administered 2018-04-15 – 2018-04-16 (×2): 40 mg via ORAL
  Filled 2018-04-14 (×2): qty 1

## 2018-04-14 MED ORDER — ACETAMINOPHEN 325 MG PO TABS: 650.0000 mg | ORAL_TABLET | Freq: Four times a day (QID) | ORAL | Status: DC | PRN

## 2018-04-14 MED ORDER — ONDANSETRON HCL 4 MG/2ML IJ SOLN: 4.0000 mg | Freq: Four times a day (QID) | INTRAMUSCULAR | Status: DC | PRN

## 2018-04-14 MED ORDER — ONDANSETRON HCL 4 MG PO TABS: 4.0000 mg | ORAL_TABLET | Freq: Four times a day (QID) | ORAL | Status: DC | PRN

## 2018-04-14 MED ORDER — ACETAMINOPHEN 650 MG RE SUPP: 650.0000 mg | Freq: Four times a day (QID) | RECTAL | Status: DC | PRN

## 2018-04-14 MED ORDER — ENOXAPARIN SODIUM 40 MG/0.4ML ~~LOC~~ SOLN
40.0000 mg | SUBCUTANEOUS | Status: DC
  Administered 2018-04-14 – 2018-04-16 (×2): 40 mg via SUBCUTANEOUS
  Filled 2018-04-14 (×2): qty 0.4

## 2018-04-14 MED ORDER — ALBUTEROL (5 MG/ML) CONTINUOUS INHALATION SOLN
10.0000 mg/h | INHALATION_SOLUTION | RESPIRATORY_TRACT | Status: DC
  Administered 2018-04-14: 10 mg/h via RESPIRATORY_TRACT
  Filled 2018-04-14: qty 20

## 2018-04-14 MED ORDER — IPRATROPIUM-ALBUTEROL 0.5-2.5 (3) MG/3ML IN SOLN
3.0000 mL | Freq: Four times a day (QID) | RESPIRATORY_TRACT | Status: DC
  Filled 2018-04-14: qty 3

## 2018-04-14 MED ORDER — METHYLPREDNISOLONE SODIUM SUCC 125 MG IJ SOLR
60.0000 mg | Freq: Four times a day (QID) | INTRAMUSCULAR | Status: AC
  Administered 2018-04-14 – 2018-04-15 (×4): 60 mg via INTRAVENOUS
  Filled 2018-04-14 (×4): qty 2

## 2018-04-14 NOTE — ED Triage Notes (Signed)
SOB x all week, gotten worse today

## 2018-04-14 NOTE — H&P (Signed)
History and Physical  [name] DPO:[id number] DOB: 1955-09-23 DOA: 04/14/2018  Referring physician: Dr Lita Mains, ED physician PCP: Rosita Fire, MD  Outpatient Specialists: Dr Gala Romney  Patient Coming From: home  Chief Complaint: SOB  HPI: Bryan [name] is a 62 y.o. male with a history of COPD, diabetes, chronic epigastric pain, history of TIA, hypertension.  Patient seen for 1 week of worsening dyspnea is worse with exertion and improved with rest.  His symptoms have been worsening.  Patient only able to ambulate approximately 50 yards without having to stop.  Denies coughing, fevers, chills, nausea, vomiting.  Emergency Department Course: Chest x-ray normal.  D-dimer slightly elevated at 0.57.  CTA shows a 4 mm nodule.  Troponin normal  Review of Systems:   Pt complains of chronic epigastric pain on a daily basis.  Pt denies any fevers, chills, nausea, vomiting, diarrhea, constipation, orthopnea, cough, palpitations, headache, vision changes, lightheadedness, dizziness, melena, rectal bleeding.  Review of systems are otherwise negative  Past Medical History:  Diagnosis Date  . Acid reflux   . Arthritis   . Asthma   . Chronic pain    with leg and back pain (disc problem)  . COPD (chronic obstructive pulmonary disease) ([place])   . Diabetes mellitus without complication ([place])   . Headache    HX OF  . History of upper GI x-ray series    to follow showed large duodenal ulcer H pylori serologies were negative  . Hypertension   . Pneumonia    07/17/17  . Pre-diabetes   . Stroke [facility])    TIA MINI STROKE  . Tubular adenoma    Past Surgical History:  Procedure Laterality Date  . BACK SURGERY  7/05; 5/09    Dr.Hirsch,3 lumbar  X3  . BACK SURGERY    . COLONOSCOPY  Feb 2012   Dr. Gala Romney: normal rectum, pedunculate polyp removed but not recovered  . COLONOSCOPY WITH ESOPHAGOGASTRODUODENOSCOPY (EGD) N/A 11/18/2013   Dr.Rourk- tcs= normal rectum, multipe polyps about the ileocecal  valve and distal transverse colon o/w the remainder of the colonic mucosa appeared normal bx= tubular adenoma. EGD= normal esophagus, stomach with scattered erosions mottling, friablility, no ulcer or infiltrating process patent pylorus bx= chronic inflammation. next TCS 11/2016  . COLONOSCOPY WITH PROPOFOL N/A 05/25/2017   Dr. Gala Romney: sigmoid diverticulosis, one 4 mm hyerplastic rectal polyp, ascending colonic AVMs surveillance 2023  . ESOPHAGOGASTRODUODENOSCOPY  1/06   Dr. Volney American esophageal erosions,U-shaped stomach,marked erosions and edema of the bulb without discrete ulcer disease.   . ESOPHAGOGASTRODUODENOSCOPY (EGD) WITH PROPOFOL N/A 05/25/2017   Dr. Gala Romney: reflux esophagitis s/p empiric dilation, normal stomach and duodenum  . MALONEY DILATION N/A 05/25/2017   Procedure: Venia Minks DILATION;  Surgeon: Daneil Dolin, MD;  Location: [facility] SU[facility];  Service: Endoscopy;  Laterality: N/A;  . NECK SURGERY  10/2007   PLATE IN NECK  . POLYPECTOMY  05/25/2017   Procedure: POLYPECTOMY;  Surgeon: Daneil Dolin, MD;  Location: [facility];  Service: Endoscopy;;  rectal  . TRANSURETHRAL RESECTION OF PROSTATE N/A 08/17/2017   Procedure: TRANSURETHRAL RESECTION OF THE PROSTATE (TURP);  Surgeon: Irine Seal, MD;  Location: [facility] OR[facility];  Service: Urology;  Laterality: N/A;   Social History:  reports that he quit smoking about 3 years ago. His smoking use included cigarettes. He has a 20.00 pack-year smoking history. He has never used smokeless tobacco. He reports that he has current or past drug history. Drug: Marijuana. He reports that he  does not drink alcohol. Patient lives at home  No Known Allergies  Family History  Problem Relation Age of Onset  . Cancer Father   . Asthma Mother   . Colon cancer Neg Hx       Prior to Admission medications   Medication Sig Start Date End Date Taking? Authorizing Provider  albuterol (PROVENTIL HFA;VENTOLIN HFA) 108 (90 Base) MCG/ACT inhaler Inhale 1-2 puffs  every 4 (four) hours as needed into the lungs for wheezing or shortness of breath. Do not use with nebulizer Patient taking differently: Inhale 1 puff into the lungs every 4 (four) hours as needed for wheezing or shortness of breath. Do not use with nebulizer 07/17/17  Yes [name], MD  albuterol (PROVENTIL) (2.5 MG/3ML) 0.083% nebulizer solution Take 3 mLs (2.5 mg total) every 4 (four) hours as needed by nebulization for wheezing or shortness of breath. 07/17/17  Yes [name], MD  amLODipine (NORVASC) 5 MG tablet Take 1 tablet (5 mg total) by mouth daily. 03/13/18 03/13/19 Yes [name], MD  atorvastatin (LIPITOR) 40 MG tablet Take 1 tablet (40 mg total) daily by mouth. 07/23/17 07/23/18 Yes [name], MD  gabapentin (NEURONTIN) 300 MG capsule Take 300 mg by mouth 3 (three) times daily.    Yes [provider]  ipratropium-albuterol (DUONEB) 0.5-2.5 (3) MG/3ML SOLN Take 3 mLs every 6 (six) hours as needed by nebulization (wheezing). 07/17/17  Yes [name], MD  metFORMIN (GLUCOPHAGE) 500 MG tablet Take 1 tablet (500 mg total) daily with breakfast by mouth. 07/23/17 07/23/18 Yes [name], MD  pantoprazole (PROTONIX) 40 MG tablet Take 1 tablet (40 mg total) by mouth 2 (two) times daily before a meal. 03/19/18  Yes [name], NP  tamsulosin (FLOMAX) 0.4 MG CAPS capsule Take 1 capsule by mouth daily. 12/18/17  Yes [provider]  blood glucose meter kit and supplies Dispense based on patient and insurance preference. Use up to four times daily as directed. (FOR ICD-9 250.00, 250.01). 07/23/17   [name], MD  predniSONE (DELTASONE) 10 MG tablet Take 8m po daily for 2 days then 331mdaily for 2 days then 2012maily for 2 days then 36m70mily for 2 days then stop Patient not taking: Reported on 04/04/2018 03/13/18   MemoKathie Dike    Physical Exam: BP 109/65   Pulse (!) 114   Resp 17   Ht _0  (1.6 m)   Wt 52.2 kg (115 lb)    SpO2 92%   BMI 20.37 kg/m   . General: Elderly black male. Awake and alert and oriented x3. No acute cardiopulmonary distress.  . HEMarland KitchenNT: Normocephalic atraumatic.  Right and left ears normal in appearance.  Pupils equal, round, reactive to light. Extraocular muscles are intact. Sclerae anicteric and noninjected.  Moist mucosal membranes. No mucosal lesions.  . Neck: Neck supple without lymphadenopathy. No carotid bruits. No masses palpated.  . Cardiovascular: Regular rate with normal S1-S2 sounds. No murmurs, rubs, gallops auscultated. No JVD.  . ReMarland Kitchenpiratory: Tight breath sounds.  Wheezing diffusely.  Prolonged exhalation.  Poor air movement no accessory muscle use. . Abdomen: Soft, tender in the epigastric region without guarding or rebound, nondistended. Active bowel sounds. No masses or hepatosplenomegaly  . Skin: No rashes, lesions, or ulcerations.  Dry, warm to touch. 2+ dorsalis pedis and radial pulses. . Musculoskeletal: No calf or leg pain. All major joints not erythematous nontender.  No upper or lower joint deformation.  Good ROM.  No contractures  . Psychiatric: Intact judgment and insight. Pleasant and cooperative. . Neurologic: No focal neurological deficits. Strength is 5/5 and symmetric in upper and lower extremities.  Cranial nerves II through XII are grossly intact.           Labs on Admission: I have personally reviewed following labs and imaging studies  CBC: Recent Labs  Lab 04/14/18 1334  WBC 7.6  HGB 12.2*  HCT 37.8*  MCV 89.4  PLT 518   Basic Metabolic Panel: Recent Labs  Lab 04/14/18 1344  NA 140  K 4.1  CL 105  CO2 28  GLUCOSE 155*  BUN 13  CREATININE 1.35*  CALCIUM 8.8*   GFR: Estimated Creatinine Clearance: 42.4 mL/min (A) (by C-G formula based on SCr of 1.35 mg/dL (H)). Liver Function Tests: Recent Labs  Lab 04/14/18 1344  AST 24  ALT 13  ALKPHOS 86  BILITOT 0.6  PROT 7.4  ALBUMIN 3.6   Recent Labs  Lab 04/14/18 1344  LIPASE 55*    No results for input(s): AMMONIA in the last 168 hours. Coagulation Profile: No results for input(s): INR, PROTIME in the last 168 hours. Cardiac Enzymes: Recent Labs  Lab 04/14/18 1344  TROPONINI <0.03   BNP (last 3 results) No results for input(s): PROBNP in the last 8760 hours. HbA1C: No results for input(s): HGBA1C in the last 72 hours. CBG: Recent Labs  Lab 04/14/18 1336  GLUCAP 171*   Lipid Profile: No results for input(s): CHOL, HDL, LDLCALC, TRIG, CHOLHDL, LDLDIRECT in the last 72 hours. Thyroid Function Tests: No results for input(s): TSH, T4TOTAL, FREET4, T3FREE, THYROIDAB in the last 72 hours. Anemia Panel: No results for input(s): VITAMINB12, FOLATE, FERRITIN, TIBC, IRON, RETICCTPCT in the last 72 hours. Urine analysis:    Component Value Date/Time   COLORURINE YELLOW 04/14/2018 [place] 04/14/2018 1446   LABSPEC 1.013 04/14/2018 1446   PHURINE 7.0 04/14/2018 1446   GLUCOSEU NEGATIVE 04/14/2018 1446   HGBUR NEGATIVE 04/14/2018 1446   BILIRUBINUR NEGATIVE 04/14/2018 1446   KETONESUR NEGATIVE 04/14/2018 1446   PROTEINUR NEGATIVE 04/14/2018 1446   UROBILINOGEN 0.2 07/08/2015 1314   NITRITE NEGATIVE 04/14/2018 1446   LEUKOCYTESUR NEGATIVE 04/14/2018 1446   Sepsis Labs: _0 (procalcitonin:4,lacticidven:4) )No results found for this or any previous visit (from the past 240 hour(s)).   Radiological Exams on Admission: Ct Angio Chest Pe W And/or Wo Contrast  Result Date: 04/14/2018 CLINICAL DATA:  Per ED notes: SOB x all week, gotten worse today. Former smoker. EXAM: CT ANGIOGRAPHY CHEST WITH CONTRAST TECHNIQUE: Multidetector CT imaging of the chest was performed using the standard protocol during bolus administration of intravenous contrast. Multiplanar CT image reconstructions and MIPs were obtained to evaluate the vascular anatomy. CONTRAST:  84m ISOVUE-370 IOPAMIDOL (ISOVUE-370) INJECTION 76% COMPARISON:  Chest x-ray and 04/14/2018  FINDINGS: Cardiovascular: There is minimal atherosclerotic calcification of the coronary arteries. There is normal opacification of the pulmonary arteries. No significant atherosclerotic calcification of the thoracic aorta. Mediastinum/Nodes: The visualized portion of the thyroid gland has a normal appearance. Esophagus is normal in appearance. No mediastinal, hilar, or axillary adenopathy. Lungs/Pleura: There is panlobular emphysema. There are no focal consolidations or pleural effusions. Mild reticular changes are identified in the LOWER lobes bilaterally, nonspecific in appearance. There is a 4 millimeter nodule within the LEFT LOWER lobe on image 102 of series 6. Upper Abdomen: No acute abnormality. Musculoskeletal: Previous cervical fusion. Partially imaged lumbar fusion. No suspicious lytic or blastic  lesions are identified. Review of the MIP images confirms the above findings. IMPRESSION: 1. Technically adequate exam showing no acute pulmonary embolus. 2. Minimal coronary artery calcification. 3. No significant thoracic aortic calcification. 4. Panlobular emphysema. 4 millimeter nodule in the LEFT LOWER lobe. No follow-up needed if patient is low-risk. Non-contrast chest CT can be considered in 12 months if patient is high-risk. This recommendation follows the consensus statement: Guidelines for Management of Incidental Pulmonary Nodules Detected on CT Images: From the Fleischner Society 2017; Radiology 2017; 284:228-243. Electronically Signed   By: Nolon Nations M.D.   On: 04/14/2018 16:19   Dg Chest Port 1 View  Result Date: 04/14/2018 CLINICAL DATA:  SOB, PER ER NOTE, SOB x all week, gotten worse today HISTORY OF DM, HTN, STROKE, ASTHMA EXAM: PORTABLE CHEST 1 VIEW COMPARISON:  03/11/2018 and earlier FINDINGS: The heart size is normal. The lungs are free of focal consolidations and pleural effusions. No pulmonary edema. IMPRESSION: No active disease. Electronically Signed   By: Nolon Nations M.D.    On: 04/14/2018 14:31    EKG: Independently reviewed.   Assessment/Plan: Principal Problem:   Acute respiratory failure with hypoxia (HCC) Active Problems:   Epigastric pain   COPD with acute exacerbation (HCC)   Type 2 diabetes mellitus with hyperlipidemia (HCC)   Pulmonary nodule   CKD (chronic kidney disease), stage III ([place])    This patient was discussed with the ED physician, including pertinent vitals, physical exam findings, labs, and imaging.  We also discussed care given by the ED provider.  1. Acute respiratory failure with hypoxia a. Observation: Patient may be able to return home tomorrow if clinically improved b. Patient satting well on 2 L c. Improving some 2. COPD with acute exacerbation Antibiotics: Not indicated at this time DuoNeb's every 6 scheduled with albuterol every 2 when necessary Continue inhaled steroids and LA bronchodilator Solu-Medrol 60 mg IV every 6 hours x24 hours, then prednisone Mucinex 3. Epigastric pain a. Chronic b. Add Pepcid c. Continue PPI 4. Type 2 diabetes a. Hold metformin as the patient had a CTA b. Lantus 10 units at bedtime c. Sliding scale insulin d. CBGs before meals and nightly 5. Pulmonary nodule a. Patient high risk as former smoker. b. Repeat CT in 4 to 6 months 6. Chronic kidney disease a. Will need renally dosed medications  DVT prophylaxis: Lovenox Consultants: None Code Status: Full code Family Communication: Wife present during interview and exam Disposition Plan: Patient should be able to return home   Truett Mainland, DO [facility] Pager [phone]  If 7PM-7AM, please contact night-coverage [email or web address] Password [email or web address]

## 2018-04-14 NOTE — ED Notes (Signed)
Pt to Rad

## 2018-04-14 NOTE — ED Provider Notes (Signed)
[facility] EMERGENCY DEPARTMENT Provider Note   CSN: [id number] Arrival date & time: 04/14/18  1321     History   Chief Complaint Chief Complaint  Patient presents with  . Shortness of Breath    HPI Bryan [name] is a 62 y.o. male.  HPI Patient states he has had difficulty breathing all week but that shortness of breath got acutely worse at 3:30 AM this morning.  Associated with central chest pain that is worse with taking deep breaths.  Minimal cough.  No fever or chills.  She also complains of upper abdominal pain that is worse with eating.  Does not wear home oxygen.  No new lower extremity swelling.  He does have chronic pain and numbness to the lower extremities due to radiculopathy. Past Medical History:  Diagnosis Date  . Acid reflux   . Arthritis   . Asthma   . Chronic pain    with leg and back pain (disc problem)  . COPD (chronic obstructive pulmonary disease) ([place])   . Diabetes mellitus without complication ([place])   . Headache    HX OF  . History of upper GI x-ray series    to follow showed large duodenal ulcer H pylori serologies were negative  . Hypertension   . Pneumonia    07/17/17  . Pre-diabetes   . Stroke [facility])    TIA MINI STROKE  . Tubular adenoma     Patient Active Problem List   Diagnosis Date Noted  . Acute respiratory failure with hypoxia ([place]) 04/14/2018  . Type 2 diabetes mellitus with hyperlipidemia ([place]) 03/12/2018  . AKI (acute kidney injury) ([place]) 03/12/2018  . COPD exacerbation ([place]) 03/11/2018  . BPH with obstruction/lower urinary tract symptoms 08/17/2017  . Left sided numbness 07/22/2017  . TIA (transient ischemic attack) 07/22/2017  . Pneumonia 07/14/2017  . COPD with acute exacerbation ([place]) 07/14/2017  . CAP (community acquired pneumonia) 07/14/2017  . Constipation 07/10/2017  . Hx of adenomatous colonic polyps 05/01/2017  . Acute renal failure (ARF) ([place]) 05/16/2016  . Nausea with vomiting 05/16/2016  . Dehydration 05/16/2016    . Hypertension   . Chronic pain   . Heme positive stool 10/20/2014  . Epigastric pain 06/19/2014  . Dysphagia 06/19/2014  . AP (abdominal pain) 11/13/2013  . Early satiety 11/13/2013  . COPD, severe ([place]) 02/22/2013  . Low back pain 11/19/2012  . COLITIS 05/12/2009  . ABDOMINAL PAIN, LEFT LOWER QUADRANT 05/12/2009  . DUODENAL ULCER, HX OF 05/12/2009  . ALCOHOL USE 05/11/2009  . GERD 05/11/2009  . Diarrhea 05/11/2009  . SPONDYLOSIS, CERVICAL 07/03/2007  . NECK PAIN 07/03/2007    Past Surgical History:  Procedure Laterality Date  . BACK SURGERY  7/05; 5/09    Dr.Hirsch,3 lumbar  X3  . BACK SURGERY    . COLONOSCOPY  Feb 2012   Dr. Gala Romney: normal rectum, pedunculate polyp removed but not recovered  . COLONOSCOPY WITH ESOPHAGOGASTRODUODENOSCOPY (EGD) N/A 11/18/2013   Dr.Rourk- tcs= normal rectum, multipe polyps about the ileocecal valve and distal transverse colon o/w the remainder of the colonic mucosa appeared normal bx= tubular adenoma. EGD= normal esophagus, stomach with scattered erosions mottling, friablility, no ulcer or infiltrating process patent pylorus bx= chronic inflammation. next TCS 11/2016  . COLONOSCOPY WITH PROPOFOL N/A 05/25/2017   Dr. Gala Romney: sigmoid diverticulosis, one 4 mm hyerplastic rectal polyp, ascending colonic AVMs surveillance 2023  . ESOPHAGOGASTRODUODENOSCOPY  1/06   Dr. Volney American esophageal erosions,U-shaped stomach,marked erosions and edema of the bulb without  discrete ulcer disease.   . ESOPHAGOGASTRODUODENOSCOPY (EGD) WITH PROPOFOL N/A 05/25/2017   Dr. Gala Romney: reflux esophagitis s/p empiric dilation, normal stomach and duodenum  . MALONEY DILATION N/A 05/25/2017   Procedure: Venia Minks DILATION;  Surgeon: Daneil Dolin, MD;  Location: [facility] ENDO SUITE;  Service: Endoscopy;  Laterality: N/A;  . NECK SURGERY  10/2007   PLATE IN NECK  . POLYPECTOMY  05/25/2017   Procedure: POLYPECTOMY;  Surgeon: Daneil Dolin, MD;  Location: [facility] ENDO SUITE;  Service:  Endoscopy;;  rectal  . TRANSURETHRAL RESECTION OF PROSTATE N/A 08/17/2017   Procedure: TRANSURETHRAL RESECTION OF THE PROSTATE (TURP);  Surgeon: Irine Seal, MD;  Location: [facility] ORS;  Service: Urology;  Laterality: N/A;        Home Medications    Prior to Admission medications   Medication Sig Start Date End Date Taking? Authorizing Provider  albuterol (PROVENTIL HFA;VENTOLIN HFA) 108 (90 Base) MCG/ACT inhaler Inhale 1-2 puffs every 4 (four) hours as needed into the lungs for wheezing or shortness of breath. Do not use with nebulizer Patient taking differently: Inhale 1 puff into the lungs every 4 (four) hours as needed for wheezing or shortness of breath. Do not use with nebulizer 07/17/17  Yes [name], MD  albuterol (PROVENTIL) (2.5 MG/3ML) 0.083% nebulizer solution Take 3 mLs (2.5 mg total) every 4 (four) hours as needed by nebulization for wheezing or shortness of breath. 07/17/17  Yes [name], MD  amLODipine (NORVASC) 5 MG tablet Take 1 tablet (5 mg total) by mouth daily. 03/13/18 03/13/19 Yes [name], MD  atorvastatin (LIPITOR) 40 MG tablet Take 1 tablet (40 mg total) daily by mouth. 07/23/17 07/23/18 Yes [name], MD  gabapentin (NEURONTIN) 300 MG capsule Take 300 mg by mouth 3 (three) times daily.    Yes [provider]  ipratropium-albuterol (DUONEB) 0.5-2.5 (3) MG/3ML SOLN Take 3 mLs every 6 (six) hours as needed by nebulization (wheezing). 07/17/17  Yes [name], MD  metFORMIN (GLUCOPHAGE) 500 MG tablet Take 1 tablet (500 mg total) daily with breakfast by mouth. 07/23/17 07/23/18 Yes [name], MD  pantoprazole (PROTONIX) 40 MG tablet Take 1 tablet (40 mg total) by mouth 2 (two) times daily before a meal. 03/19/18  Yes [name], NP  tamsulosin (FLOMAX) 0.4 MG CAPS capsule Take 1 capsule by mouth daily. 12/18/17  Yes [provider]  blood glucose meter kit and supplies Dispense based on patient and insurance  preference. Use up to four times daily as directed. (FOR ICD-9 250.00, 250.01). 07/23/17   [name], MD  predniSONE (DELTASONE) 10 MG tablet Take 61m po daily for 2 days then 336mdaily for 2 days then 2082maily for 2 days then 64m78mily for 2 days then stop Patient not taking: Reported on 04/04/2018 03/13/18   MemoKathie Dike    Family History Family History  Problem Relation Age of Onset  . Cancer Father   . Asthma Mother   . Colon cancer Neg Hx     Social History Social History   Tobacco Use  . Smoking status: Former Smoker    Packs/day: 0.50    Years: 40.00    Pack years: 20.00    Types: Cigarettes    Last attempt to quit: 11/11/2014    Years since quitting: 3.4  . Smokeless tobacco: Never Used  Substance Use Topics  . Alcohol use: No    Alcohol/week: 0.0 oz  . Drug use: Yes  Types: Marijuana    Comment:  'every once in a while"      Allergies   Patient has no known allergies.   Review of Systems Review of Systems  Constitutional: Negative for chills and fever.  HENT: Negative for sore throat and trouble swallowing.   Eyes: Negative for visual disturbance.  Respiratory: Positive for shortness of breath. Negative for cough.   Cardiovascular: Positive for chest pain. Negative for palpitations and leg swelling.  Gastrointestinal: Positive for abdominal pain. Negative for blood in stool, diarrhea, nausea and vomiting.  Genitourinary: Positive for difficulty urinating and frequency. Negative for flank pain and hematuria.  Musculoskeletal: Negative for back pain, myalgias and neck pain.  Skin: Negative for rash and wound.  Neurological: Negative for dizziness, weakness, light-headedness, numbness and headaches.  All other systems reviewed and are negative.    Physical Exam Updated Vital Signs BP 115/73   Pulse 94   Resp (!) 26   Ht _0  (1.6 m)   Wt 52.2 kg (115 lb)   SpO2 95%   BMI 20.37 kg/m   Physical Exam  Constitutional: He is oriented  to person, place, and time. He appears well-developed and well-nourished. No distress.  HENT:  Head: Normocephalic and atraumatic.  Mouth/Throat: Oropharynx is clear and moist. No oropharyngeal exudate.  Eyes: Pupils are equal, round, and reactive to light. EOM are normal.  Neck: Normal range of motion. Neck supple. No JVD present.  Cardiovascular: Normal rate and regular rhythm. Exam reveals no gallop and no friction rub.  No murmur heard. Pulmonary/Chest: Effort normal and breath sounds normal. No stridor. No respiratory distress. He has no wheezes. He has no rales. He exhibits no tenderness.  Abdominal: Soft. Bowel sounds are normal. He exhibits no distension and no mass. There is tenderness. There is no rebound and no guarding.  Epigastric tenderness to palpation.  No rebound or guarding.  Musculoskeletal: Normal range of motion. He exhibits no edema or tenderness.  No midline thoracic or lumbar tenderness.  No CVA tenderness.  Question mild right lower extremity swelling compared to left.  No tenderness to palpation.  Distal pulses intact.  Lymphadenopathy:    He has no cervical adenopathy.  Neurological: He is alert and oriented to person, place, and time.  Moves all extremities without focal deficit.  Sensation fully intact.  Skin: Skin is warm and dry. Capillary refill takes less than 2 seconds. No rash noted. He is not diaphoretic. No erythema.  Psychiatric: He has a normal mood and affect. His behavior is normal.  Nursing note and vitals reviewed.    ED Treatments / Results  Labs (all labs ordered are listed, but only abnormal results are displayed) Labs Reviewed  CBC - Abnormal; Notable for the following components:      Result Value   Hemoglobin 12.2 (*)    HCT 37.8 (*)    All other components within normal limits  COMPREHENSIVE METABOLIC PANEL - Abnormal; Notable for the following components:   Glucose, Bld 155 (*)    Creatinine, Ser 1.35 (*)    Calcium 8.8 (*)    GFR  calc non Af Amer 55 (*)    All other components within normal limits  LIPASE, BLOOD - Abnormal; Notable for the following components:   Lipase 55 (*)    All other components within normal limits  D-DIMER, QUANTITATIVE (NOT AT [facility]) - Abnormal; Notable for the following components:   D-Dimer, Quant 0.57 (*)    All other components within  normal limits  CBG MONITORING, ED - Abnormal; Notable for the following components:   Glucose-Capillary 171 (*)    All other components within normal limits  TROPONIN I  URINALYSIS, ROUTINE W REFLEX MICROSCOPIC  BRAIN NATRIURETIC PEPTIDE    EKG EKG Interpretation  Date/Time:  Saturday April 14 2018 13:31:17 EDT Ventricular Rate:  94 PR Interval:    QRS Duration: 75 QT Interval:  324 QTC Calculation: 406 R Axis:   91 Text Interpretation:  Sinus rhythm Right axis deviation Confirmed by Julianne Rice [phone]) on 04/14/2018 2:17:44 PM   Radiology Ct Angio Chest Pe W And/or Wo Contrast  Result Date: 04/14/2018 CLINICAL DATA:  Per ED notes: SOB x all week, gotten worse today. Former smoker. EXAM: CT ANGIOGRAPHY CHEST WITH CONTRAST TECHNIQUE: Multidetector CT imaging of the chest was performed using the standard protocol during bolus administration of intravenous contrast. Multiplanar CT image reconstructions and MIPs were obtained to evaluate the vascular anatomy. CONTRAST:  47m ISOVUE-370 IOPAMIDOL (ISOVUE-370) INJECTION 76% COMPARISON:  Chest x-ray and 04/14/2018 FINDINGS: Cardiovascular: There is minimal atherosclerotic calcification of the coronary arteries. There is normal opacification of the pulmonary arteries. No significant atherosclerotic calcification of the thoracic aorta. Mediastinum/Nodes: The visualized portion of the thyroid gland has a normal appearance. Esophagus is normal in appearance. No mediastinal, hilar, or axillary adenopathy. Lungs/Pleura: There is panlobular emphysema. There are no focal consolidations or pleural effusions. Mild  reticular changes are identified in the LOWER lobes bilaterally, nonspecific in appearance. There is a 4 millimeter nodule within the LEFT LOWER lobe on image 102 of series 6. Upper Abdomen: No acute abnormality. Musculoskeletal: Previous cervical fusion. Partially imaged lumbar fusion. No suspicious lytic or blastic lesions are identified. Review of the MIP images confirms the above findings. IMPRESSION: 1. Technically adequate exam showing no acute pulmonary embolus. 2. Minimal coronary artery calcification. 3. No significant thoracic aortic calcification. 4. Panlobular emphysema. 4 millimeter nodule in the LEFT LOWER lobe. No follow-up needed if patient is low-risk. Non-contrast chest CT can be considered in 12 months if patient is high-risk. This recommendation follows the consensus statement: Guidelines for Management of Incidental Pulmonary Nodules Detected on CT Images: From the Fleischner Society 2017; Radiology 2017; 284:228-243. Electronically Signed   By: ENolon NationsM.D.   On: 04/14/2018 16:19   Dg Chest Port 1 View  Result Date: 04/14/2018 CLINICAL DATA:  SOB, PER ER NOTE, SOB x all week, gotten worse today HISTORY OF DM, HTN, STROKE, ASTHMA EXAM: PORTABLE CHEST 1 VIEW COMPARISON:  03/11/2018 and earlier FINDINGS: The heart size is normal. The lungs are free of focal consolidations and pleural effusions. No pulmonary edema. IMPRESSION: No active disease. Electronically Signed   By: ENolon NationsM.D.   On: 04/14/2018 14:31    Procedures Procedures (including critical care time)  Medications Ordered in ED Medications  albuterol (PROVENTIL,VENTOLIN) solution continuous neb (10 mg/hr Nebulization New Bag/Given 04/14/18 1702)  albuterol (PROVENTIL) (2.5 MG/3ML) 0.083% nebulizer solution 5 mg (5 mg Nebulization Given 04/14/18 1402)  iopamidol (ISOVUE-370) 76 % injection 75 mL (75 mLs Intravenous Contrast Given 04/14/18 1552)  methylPREDNISolone sodium succinate (SOLU-MEDROL) 125 mg/2 mL  injection 125 mg (125 mg Intravenous Given 04/14/18 1655)  gi cocktail (Maalox,Lidocaine,Donnatal) (30 mLs Oral Given 04/14/18 1655)   CRITICAL CARE Performed by: DJulianne RiceTotal critical care time: 30 minutes Critical care time was exclusive of separately billable procedures and treating other patients. Critical care was necessary to treat or prevent imminent or life-threatening deterioration. Critical care was  time spent personally by me on the following activities: development of treatment plan with patient and/or surrogate as well as nursing, discussions with consultants, evaluation of patient's response to treatment, examination of patient, obtaining history from patient or surrogate, ordering and performing treatments and interventions, ordering and review of laboratory studies, ordering and review of radiographic studies, pulse oximetry and re-evaluation of patient's condition.   Initial Impression / Assessment and Plan / ED Course  I have reviewed the triage vital signs and the nursing notes.  Pertinent labs & imaging results that were available during my care of the patient were reviewed by me and considered in my medical decision making (see chart for details).    Patient states he was feeling better after initial breathing treatment and GI cocktail.  Epigastric pain had resolved.  CT angios chest without acute findings.  Patient given continuous nebulized treatment.  Still requiring supplemental oxygen to maintain saturations over 90%.  Discussed with hospitalist who will see patient in the emergency department and admit.   Final Clinical Impressions(s) / ED Diagnoses   Final diagnoses:  COPD exacerbation [facility])    ED Discharge Orders    None       Julianne Rice, MD 04/14/18 [phone]

## 2018-04-14 NOTE — ED Notes (Signed)
Call to resp

## 2018-04-14 NOTE — ED Notes (Signed)
Call to call floor to [place], [place]

## 2018-04-14 NOTE — ED Notes (Signed)
Receiving breathing treatment

## 2018-04-15 DIAGNOSIS — R0602 Shortness of breath: Secondary | ICD-10-CM | POA: Diagnosis not present

## 2018-04-15 DIAGNOSIS — J449 Chronic obstructive pulmonary disease, unspecified: Secondary | ICD-10-CM | POA: Diagnosis not present

## 2018-04-15 DIAGNOSIS — J9601 Acute respiratory failure with hypoxia: Secondary | ICD-10-CM

## 2018-04-15 DIAGNOSIS — J441 Chronic obstructive pulmonary disease with (acute) exacerbation: Secondary | ICD-10-CM | POA: Diagnosis not present

## 2018-04-15 DIAGNOSIS — J4520 Mild intermittent asthma, uncomplicated: Secondary | ICD-10-CM | POA: Diagnosis not present

## 2018-04-15 LAB — GLUCOSE, CAPILLARY
GLUCOSE-CAPILLARY: 154 mg/dL — AB (ref 70–99)
GLUCOSE-CAPILLARY: 135 mg/dL — AB (ref 70–99)
Glucose-Capillary: 139 mg/dL — ABNORMAL HIGH (ref 70–99)
Glucose-Capillary: 111 mg/dL — ABNORMAL HIGH (ref 70–99)

## 2018-04-15 LAB — BASIC METABOLIC PANEL
Anion gap: 7 (ref 5–15)
BUN: 13 mg/dL (ref 8–23)
CHLORIDE: 104 mmol/L (ref 98–111)
CO2: 26 mmol/L (ref 22–32)
CREATININE: 1.34 mg/dL — AB (ref 0.61–1.24)
Calcium: 8.7 mg/dL — ABNORMAL LOW (ref 8.9–10.3)
GFR calc Af Amer: >60 mL/min (ref >60)
GFR calc non Af Amer: 56 mL/min — ABNORMAL LOW (ref >60)
GLUCOSE: 131 mg/dL — AB (ref 70–99)
Potassium: 4.4 mmol/L (ref 3.5–5.1)
Sodium: 137 mmol/L (ref 135–145)

## 2018-04-15 MED ORDER — LINACLOTIDE 145 MCG PO CAPS: 145.0000 ug | ORAL_CAPSULE | Freq: Every day | ORAL | Status: DC

## 2018-04-15 MED ORDER — LINACLOTIDE 145 MCG PO CAPS
145.0000 ug | ORAL_CAPSULE | Freq: Every day | ORAL | Status: DC
  Administered 2018-04-15: 145 ug via ORAL
  Filled 2018-04-15 (×2): qty 1

## 2018-04-15 MED ORDER — IPRATROPIUM-ALBUTEROL 0.5-2.5 (3) MG/3ML IN SOLN
3.0000 mL | Freq: Three times a day (TID) | RESPIRATORY_TRACT | Status: DC
  Administered 2018-04-15 – 2018-04-16 (×5): 3 mL via RESPIRATORY_TRACT
  Filled 2018-04-15 (×5): qty 3

## 2018-04-15 NOTE — Progress Notes (Signed)
PROGRESS NOTE    Bryan [name]  TDD:[id number] DOB: 14-Oct-1955 DOA: 04/14/2018 PCP: Rosita Fire, MD   Brief Narrative:   Bryan [name] is a 62 y.o. male with a history of COPD, diabetes, chronic epigastric pain, history of TIA, hypertension.  Patient seen for 1 week of worsening dyspnea is worse with exertion and improved with rest.  His symptoms have been worsening.  He was admitted with acute hypoxemic respiratory failure secondary to COPD exacerbation.  He was also noted to have a pulmonary nodule on his chest CT with recommendations to repeat scan in 4 to 6 months.  He has been started on breathing treatments and IV steroids with some improvement noted, but he continues to have oxygen desaturation as well as shortness of breath with ambulation.   Assessment & Plan:   Principal Problem:   Acute respiratory failure with hypoxia (HCC) Active Problems:   Epigastric pain   COPD with acute exacerbation (HCC)   Type 2 diabetes mellitus with hyperlipidemia (HCC)   Pulmonary nodule   CKD (chronic kidney disease), stage III ([place])   1. Acute hypoxemic respiratory failure secondary to AECOPD.  Continue treatment with IV Solu-Medrol every 6 hours as ordered with transition to prednisone by a.m.  Continue scheduled DuoNeb's.  Wean oxygen as tolerated.  No signs of bronchiectasis noted. 2. Pulmonary nodule.  With prior history of smoking we will plan to repeat CT in 4 to 6 months. 3. Type 2 diabetes.  Continue with hold metformin at this time and maintain on sliding scale insulin with home Lantus.  Blood glucose currently stable while on IV steroids. 4. CKD likely stage II-III.  Appears to be at baseline.   DVT prophylaxis:Lovenox Code Status: Full Family Communication: Sister and family members at bedside Disposition Plan: AECOPD treatment with possible DC in AM with O2 in AM   Consultants:   None  Procedures:   None  Antimicrobials:   None   Subjective: Patient seen and  evaluated today with no new acute complaints or concerns. No acute concerns or events noted overnight.  Objective: Vitals:   04/15/18 0554 04/15/18 0729 04/15/18 1228 04/15/18 1337  BP: (!) 113/59  138/72   Pulse: 94  95   Resp: 17  18   Temp: 98.4 F (36.9 C)  98.8 F (37.1 C)   TempSrc: Oral  Oral   SpO2: 98% 93% 96% 93%  Weight:      Height:        Intake/Output Summary (Last 24 hours) at 04/15/2018 1449 Last data filed at 04/15/2018 0700 Gross per 24 hour  Intake -  Output 650 ml  Net -650 ml   Filed Weights   04/14/18 1327 04/14/18 2033  Weight: 52.2 kg (115 lb) 53.2 kg (117 lb 4.6 oz)    Examination:  General exam: Appears calm and comfortable  Respiratory system: Clear to auscultation. Respiratory effort normal.  Currently on 2 L nasal cannula. Cardiovascular system: S1 & S2 heard, RRR. No JVD, murmurs, rubs, gallops or clicks. No pedal edema. Gastrointestinal system: Abdomen is nondistended, soft and nontender. No organomegaly or masses felt. Normal bowel sounds heard. Central nervous system: Alert and oriented. No focal neurological deficits. Extremities: Symmetric 5 x 5 power. Skin: No rashes, lesions or ulcers Psychiatry: Judgement and insight appear normal. Mood & affect appropriate.     Data Reviewed: I have personally reviewed following labs and imaging studies  CBC: Recent Labs  Lab 04/14/18 1334  WBC 7.6  HGB  12.2*  HCT 37.8*  MCV 89.4  PLT 638   Basic Metabolic Panel: Recent Labs  Lab 04/14/18 1344 04/15/18 0717  NA 140 137  K 4.1 4.4  CL 105 104  CO2 28 26  GLUCOSE 155* 131*  BUN 13 13  CREATININE 1.35* 1.34*  CALCIUM 8.8* 8.7*   GFR: Estimated Creatinine Clearance: 43.6 mL/min (A) (by C-G formula based on SCr of 1.34 mg/dL (H)). Liver Function Tests: Recent Labs  Lab 04/14/18 1344  AST 24  ALT 13  ALKPHOS 86  BILITOT 0.6  PROT 7.4  ALBUMIN 3.6   Recent Labs  Lab 04/14/18 1344  LIPASE 55*   No results for input(s):  AMMONIA in the last 168 hours. Coagulation Profile: No results for input(s): INR, PROTIME in the last 168 hours. Cardiac Enzymes: Recent Labs  Lab 04/14/18 1344  TROPONINI <0.03   BNP (last 3 results) No results for input(s): PROBNP in the last 8760 hours. HbA1C: No results for input(s): HGBA1C in the last 72 hours. CBG: Recent Labs  Lab 04/14/18 1336 04/14/18 2351 04/15/18 0738 04/15/18 1230  GLUCAP 171* 202* 139* 111*   Lipid Profile: No results for input(s): CHOL, HDL, LDLCALC, TRIG, CHOLHDL, LDLDIRECT in the last 72 hours. Thyroid Function Tests: No results for input(s): TSH, T4TOTAL, FREET4, T3FREE, THYROIDAB in the last 72 hours. Anemia Panel: No results for input(s): VITAMINB12, FOLATE, FERRITIN, TIBC, IRON, RETICCTPCT in the last 72 hours. Sepsis Labs: No results for input(s): PROCALCITON, LATICACIDVEN in the last 168 hours.  No results found for this or any previous visit (from the past 240 hour(s)).       Radiology Studies: Ct Angio Chest Pe W And/or Wo Contrast  Result Date: 04/14/2018 CLINICAL DATA:  Per ED notes: SOB x all week, gotten worse today. Former smoker. EXAM: CT ANGIOGRAPHY CHEST WITH CONTRAST TECHNIQUE: Multidetector CT imaging of the chest was performed using the standard protocol during bolus administration of intravenous contrast. Multiplanar CT image reconstructions and MIPs were obtained to evaluate the vascular anatomy. CONTRAST:  72m ISOVUE-370 IOPAMIDOL (ISOVUE-370) INJECTION 76% COMPARISON:  Chest x-ray and 04/14/2018 FINDINGS: Cardiovascular: There is minimal atherosclerotic calcification of the coronary arteries. There is normal opacification of the pulmonary arteries. No significant atherosclerotic calcification of the thoracic aorta. Mediastinum/Nodes: The visualized portion of the thyroid gland has a normal appearance. Esophagus is normal in appearance. No mediastinal, hilar, or axillary adenopathy. Lungs/Pleura: There is panlobular  emphysema. There are no focal consolidations or pleural effusions. Mild reticular changes are identified in the LOWER lobes bilaterally, nonspecific in appearance. There is a 4 millimeter nodule within the LEFT LOWER lobe on image 102 of series 6. Upper Abdomen: No acute abnormality. Musculoskeletal: Previous cervical fusion. Partially imaged lumbar fusion. No suspicious lytic or blastic lesions are identified. Review of the MIP images confirms the above findings. IMPRESSION: 1. Technically adequate exam showing no acute pulmonary embolus. 2. Minimal coronary artery calcification. 3. No significant thoracic aortic calcification. 4. Panlobular emphysema. 4 millimeter nodule in the LEFT LOWER lobe. No follow-up needed if patient is low-risk. Non-contrast chest CT can be considered in 12 months if patient is high-risk. This recommendation follows the consensus statement: Guidelines for Management of Incidental Pulmonary Nodules Detected on CT Images: From the Fleischner Society 2017; Radiology 2017; 284:228-243. Electronically Signed   By: ENolon NationsM.D.   On: 04/14/2018 16:19   Dg Chest Port 1 View  Result Date: 04/14/2018 CLINICAL DATA:  SOB, PER ER NOTE, SOB x all week,  gotten worse today HISTORY OF DM, HTN, STROKE, ASTHMA EXAM: PORTABLE CHEST 1 VIEW COMPARISON:  03/11/2018 and earlier FINDINGS: The heart size is normal. The lungs are free of focal consolidations and pleural effusions. No pulmonary edema. IMPRESSION: No active disease. Electronically Signed   By: Nolon Nations M.D.   On: 04/14/2018 14:31        Scheduled Meds: . amLODipine  5 mg Oral Daily  . atorvastatin  40 mg Oral Daily  . enoxaparin (LOVENOX) injection  40 mg Subcutaneous Q24H  . famotidine  20 mg Oral BID  . gabapentin  300 mg Oral TID  . insulin aspart  0-15 Units Subcutaneous TID WC  . insulin glargine  10 Units Subcutaneous QHS  . ipratropium-albuterol  3 mL Nebulization TID  . methylPREDNISolone (SOLU-MEDROL)  injection  60 mg Intravenous Q6H   Followed by  . [START ON 04/16/2018] predniSONE  40 mg Oral Q breakfast  . mometasone-formoterol  1 puff Inhalation BID  . pantoprazole  40 mg Oral BID AC  . tamsulosin  0.4 mg Oral Daily   Continuous Infusions: . albuterol 10 mg/hr (04/14/18 1702)     LOS: 0 days    Time spent: 30 minutes    Shelitha Magley Darleen Crocker, DO [facility] Pager [phone]  If 7PM-7AM, please contact night-coverage [email or web address] Password [email or web address] 04/15/2018, 2:49 PM

## 2018-04-16 DIAGNOSIS — J9601 Acute respiratory failure with hypoxia: Secondary | ICD-10-CM | POA: Diagnosis not present

## 2018-04-16 LAB — GLUCOSE, CAPILLARY
Glucose-Capillary: 98 mg/dL (ref 70–99)
Glucose-Capillary: 91 mg/dL (ref 70–99)

## 2018-04-16 LAB — HIV ANTIBODY (ROUTINE TESTING W REFLEX): HIV Screen 4th Generation wRfx: NONREACTIVE

## 2018-04-16 LAB — BASIC METABOLIC PANEL
ANION GAP: 6 (ref 5–15)
BUN: 20 mg/dL (ref 8–23)
CHLORIDE: 105 mmol/L (ref 98–111)
CO2: 29 mmol/L (ref 22–32)
Calcium: 8.8 mg/dL — ABNORMAL LOW (ref 8.9–10.3)
Creatinine, Ser: 1.41 mg/dL — ABNORMAL HIGH (ref 0.61–1.24)
GFR calc Af Amer: >60 mL/min (ref >60)
GFR, EST NON AFRICAN AMERICAN: 52 mL/min — AB (ref >60)
Glucose, Bld: 113 mg/dL — ABNORMAL HIGH (ref 70–99)
POTASSIUM: 4.2 mmol/L (ref 3.5–5.1)
Sodium: 140 mmol/L (ref 135–145)

## 2018-04-16 LAB — CBC
HEMATOCRIT: 37.7 % — AB (ref 39.0–52.0)
HEMOGLOBIN: 12.4 g/dL — AB (ref 13.0–17.0)
MCH: 29.2 pg (ref 26.0–34.0)
MCHC: 32.9 g/dL (ref 30.0–36.0)
MCV: 88.9 fL (ref 78.0–100.0)
Platelets: 344 10*3/uL (ref 150–400)
RBC: 4.24 MIL/uL (ref 4.22–5.81)
RDW: 14.3 % (ref 11.5–15.5)
WBC: 20.5 10*3/uL — AB (ref 4.0–10.5)

## 2018-04-16 MED ORDER — PREDNISONE 20 MG PO TABS: 40.0000 mg | ORAL_TABLET | Freq: Every day | ORAL | 0 refills | Status: AC

## 2018-04-16 MED ORDER — MOMETASONE FURO-FORMOTEROL FUM 200-5 MCG/ACT IN AERO: 1.0000 | INHALATION_SPRAY | Freq: Two times a day (BID) | RESPIRATORY_TRACT | 0 refills | Status: DC

## 2018-04-16 NOTE — Discharge Summary (Signed)
Physician Discharge Summary  LATHANIEL LEGATE VFI:[id number] DOB: Jan 22, 1956 DOA: 04/14/2018  PCP: Rosita Fire, MD  Admit date: 04/14/2018  Discharge date: 04/16/2018  Admitted From:Home  Disposition:  Home  Recommendations for Outpatient Follow-up:  1. Follow up with PCP in 1-2 weeks 2. Needs repeat Chest CT to follow up pulmonary nodule in 4-6 months  Home Health:N/A  Equipment/Devices:N/A  Discharge Condition:Stable  CODE STATUS: Full  Diet recommendation: Heart Healthy/Carb Modified  Brief/Interim Summary:  Bryan Cara Loganis a 62 y.o.malewith a history of COPD, diabetes, chronic epigastric pain, history of TIA, hypertension. Patient seen for 1 week of worsening dyspnea is worse with exertion and improved with rest. His symptoms have been worsening.  He was admitted with acute hypoxemic respiratory failure secondary to COPD exacerbation.  He was also noted to have a pulmonary nodule on his chest CT with recommendations to repeat scan in 4 to 6 months.  He has been started on breathing treatments and IV steroids with significant improvement noted over the course of the last 2 days.  He has ambulated today with no oxygen desaturations noted and has remained on room air.  He denies any further symptoms and will remain on oral prednisone for 5 more days as prescribed.  Follow-up to PCP in 1 week.  He states that he quit smoking approximately 3 to 4 months ago.    Discharge Diagnoses:  Principal Problem:   Acute respiratory failure with hypoxia (HCC) Active Problems:   Epigastric pain   COPD with acute exacerbation (HCC)   Type 2 diabetes mellitus with hyperlipidemia (HCC)   Pulmonary nodule   CKD (chronic kidney disease), stage III ([place])  1. Acute hypoxemic respiratory failure secondary to AECOPD-resolved.  Continue to use nebulizer machine at home as needed for shortness of breath or wheezing.  Continue prednisone for 5 more days as prescribed.  Dulera prescribed as  well. 2. Pulmonary nodule.  With prior history of smoking we will plan to repeat CT in 4 to 6 months. 3. Type 2 diabetes.  Continue home medications as prior.  No steroid-induced hyperglycemia noted. 4. CKD likely stage II-III.  Appears to be at baseline.   Discharge Instructions  Discharge Instructions    Diet - low sodium heart healthy   Complete by:  As directed    Increase activity slowly   Complete by:  As directed      Allergies as of 04/16/2018   No Known Allergies     Medication List    TAKE these medications   albuterol (2.5 MG/3ML) 0.083% nebulizer solution Commonly known as:  PROVENTIL Take 3 mLs (2.5 mg total) every 4 (four) hours as needed by nebulization for wheezing or shortness of breath. What changed:  Another medication with the same name was changed. Make sure you understand how and when to take each.   albuterol 108 (90 Base) MCG/ACT inhaler Commonly known as:  PROVENTIL HFA;VENTOLIN HFA Inhale 1-2 puffs every 4 (four) hours as needed into the lungs for wheezing or shortness of breath. Do not use with nebulizer What changed:    how much to take  additional instructions   amLODipine 5 MG tablet Commonly known as:  NORVASC Take 1 tablet (5 mg total) by mouth daily.   atorvastatin 40 MG tablet Commonly known as:  LIPITOR Take 1 tablet (40 mg total) daily by mouth.   blood glucose meter kit and supplies Dispense based on patient and insurance preference. Use up to four times daily as directed. (  FOR ICD-9 250.00, 250.01).   gabapentin 300 MG capsule Commonly known as:  NEURONTIN Take 300 mg by mouth 3 (three) times daily.   ipratropium-albuterol 0.5-2.5 (3) MG/3ML Soln Commonly known as:  DUONEB Take 3 mLs every 6 (six) hours as needed by nebulization (wheezing).   metFORMIN 500 MG tablet Commonly known as:  GLUCOPHAGE Take 1 tablet (500 mg total) daily with breakfast by mouth.   mometasone-formoterol 200-5 MCG/ACT Aero Commonly known as:   DULERA Inhale 1 puff into the lungs 2 (two) times daily.   pantoprazole 40 MG tablet Commonly known as:  PROTONIX Take 1 tablet (40 mg total) by mouth 2 (two) times daily before a meal.   predniSONE 20 MG tablet Commonly known as:  DELTASONE Take 2 tablets (40 mg total) by mouth daily with breakfast for 5 days. Start taking on:  04/17/2018 What changed:    medication strength  how much to take  how to take this  when to take this  additional instructions   tamsulosin 0.4 MG Caps capsule Commonly known as:  FLOMAX Take 1 capsule by mouth daily.      Follow-up Information    Rosita Fire, MD Follow up in 1 week(s).   Specialty:  Internal Medicine Contact information: [place] [address] [phone]          No Known Allergies  Consultations:  None   Procedures/Studies: Ct Angio Chest Pe W And/or Wo Contrast  Result Date: 04/14/2018 CLINICAL DATA:  Per ED notes: SOB x all week, gotten worse today. Former smoker. EXAM: CT ANGIOGRAPHY CHEST WITH CONTRAST TECHNIQUE: Multidetector CT imaging of the chest was performed using the standard protocol during bolus administration of intravenous contrast. Multiplanar CT image reconstructions and MIPs were obtained to evaluate the vascular anatomy. CONTRAST:  39m ISOVUE-370 IOPAMIDOL (ISOVUE-370) INJECTION 76% COMPARISON:  Chest x-ray and 04/14/2018 FINDINGS: Cardiovascular: There is minimal atherosclerotic calcification of the coronary arteries. There is normal opacification of the pulmonary arteries. No significant atherosclerotic calcification of the thoracic aorta. Mediastinum/Nodes: The visualized portion of the thyroid gland has a normal appearance. Esophagus is normal in appearance. No mediastinal, hilar, or axillary adenopathy. Lungs/Pleura: There is panlobular emphysema. There are no focal consolidations or pleural effusions. Mild reticular changes are identified in the LOWER lobes bilaterally,  nonspecific in appearance. There is a 4 millimeter nodule within the LEFT LOWER lobe on image 102 of series 6. Upper Abdomen: No acute abnormality. Musculoskeletal: Previous cervical fusion. Partially imaged lumbar fusion. No suspicious lytic or blastic lesions are identified. Review of the MIP images confirms the above findings. IMPRESSION: 1. Technically adequate exam showing no acute pulmonary embolus. 2. Minimal coronary artery calcification. 3. No significant thoracic aortic calcification. 4. Panlobular emphysema. 4 millimeter nodule in the LEFT LOWER lobe. No follow-up needed if patient is low-risk. Non-contrast chest CT can be considered in 12 months if patient is high-risk. This recommendation follows the consensus statement: Guidelines for Management of Incidental Pulmonary Nodules Detected on CT Images: From the Fleischner Society 2017; Radiology 2017; 284:228-243. Electronically Signed   By: ENolon NationsM.D.   On: 04/14/2018 16:19   Dg Chest Port 1 View  Result Date: 04/14/2018 CLINICAL DATA:  SOB, PER ER NOTE, SOB x all week, gotten worse today HISTORY OF DM, HTN, STROKE, ASTHMA EXAM: PORTABLE CHEST 1 VIEW COMPARISON:  03/11/2018 and earlier FINDINGS: The heart size is normal. The lungs are free of focal consolidations and pleural effusions. No pulmonary edema. IMPRESSION: No  active disease. Electronically Signed   By: Nolon Nations M.D.   On: 04/14/2018 14:31     Discharge Exam: Vitals:   04/16/18 0740 04/16/18 0937  BP:  (!) 115/58  Pulse:    Resp:    Temp:    SpO2: 98%    Vitals:   04/16/18 0534 04/16/18 0737 04/16/18 0740 04/16/18 0937  BP: 122/87   (!) 115/58  Pulse: 84     Resp: 18     Temp: 98.4 F (36.9 C)     TempSrc: Oral     SpO2: 98% 98% 98%   Weight:      Height:        General: Pt is alert, awake, not in acute distress Cardiovascular: RRR, S1/S2 +, no rubs, no gallops Respiratory: CTA bilaterally, no wheezing, no rhonchi Abdominal: Soft, NT, ND, bowel  sounds + Extremities: no edema, no cyanosis    The results of significant diagnostics from this hospitalization (including imaging, microbiology, ancillary and laboratory) are listed below for reference.     Microbiology: No results found for this or any previous visit (from the past 240 hour(s)).   Labs: BNP (last 3 results) Recent Labs    07/14/17 1655 04/14/18 1345  BNP 101.0* 86.1   Basic Metabolic Panel: Recent Labs  Lab 04/14/18 1344 04/15/18 0717 04/16/18 0552  NA 140 137 140  K 4.1 4.4 4.2  CL 105 104 105  CO2 _0 GLUCOSE 155* 131* 113*  BUN _1 CREATININE 1.35* 1.34* 1.41*  CALCIUM 8.8* 8.7* 8.8*   Liver Function Tests: Recent Labs  Lab 04/14/18 1344  AST 24  ALT 13  ALKPHOS 86  BILITOT 0.6  PROT 7.4  ALBUMIN 3.6   Recent Labs  Lab 04/14/18 1344  LIPASE 55*   No results for input(s): AMMONIA in the last 168 hours. CBC: Recent Labs  Lab 04/14/18 1334 04/16/18 0552  WBC 7.6 20.5*  HGB 12.2* 12.4*  HCT 37.8* 37.7*  MCV 89.4 88.9  PLT 369 344   Cardiac Enzymes: Recent Labs  Lab 04/14/18 1344  TROPONINI <0.03   BNP: Invalid input(s): POCBNP CBG: Recent Labs  Lab 04/15/18 1230 04/15/18 1756 04/15/18 2206 04/16/18 0729 04/16/18 1146  GLUCAP 111* 154* 135* 98 91   D-Dimer Recent Labs    04/14/18 1344  DDIMER 0.57*   Hgb A1c No results for input(s): HGBA1C in the last 72 hours. Lipid Profile No results for input(s): CHOL, HDL, LDLCALC, TRIG, CHOLHDL, LDLDIRECT in the last 72 hours. Thyroid function studies No results for input(s): TSH, T4TOTAL, T3FREE, THYROIDAB in the last 72 hours.  Invalid input(s): FREET3 Anemia work up No results for input(s): VITAMINB12, FOLATE, FERRITIN, TIBC, IRON, RETICCTPCT in the last 72 hours. Urinalysis    Component Value Date/Time   COLORURINE YELLOW 04/14/2018 [place] 04/14/2018 1446   LABSPEC 1.013 04/14/2018 1446   PHURINE 7.0 04/14/2018 1446    GLUCOSEU NEGATIVE 04/14/2018 1446   HGBUR NEGATIVE 04/14/2018 1446   BILIRUBINUR NEGATIVE 04/14/2018 1446   KETONESUR NEGATIVE 04/14/2018 1446   PROTEINUR NEGATIVE 04/14/2018 1446   UROBILINOGEN 0.2 07/08/2015 1314   NITRITE NEGATIVE 04/14/2018 1446   LEUKOCYTESUR NEGATIVE 04/14/2018 1446   Sepsis Labs Invalid input(s): PROCALCITONIN,  WBC,  LACTICIDVEN Microbiology No results found for this or any previous visit (from the past 240 hour(s)).   Time coordinating discharge: 40 minutes  SIGNED:   Rodena Goldmann, DO [facility] 04/16/2018, 12:32  PM Pager [phone]  If 7PM-7AM, please contact night-coverage [email or web address] Password [email or web address]

## 2018-04-16 NOTE — Care Management Note (Signed)
Case Management Note  Patient Details  Name: Bryan [name] MRN: [id number] Date of Birth: 10-07-1955  Subjective/Objective:     Admitted with COPD. CM consult for COPD. Pt from home, lives with family. Pt has insurance with drug coverage. He has no difficulty affording, getting or taking his medications. He has neb machine and inhalers he uses as instructed. He drives himself to appointments. He communicates no needs or concerns about DC needs. Pt active with [facility].                 Action/Plan: DC home with self care. No CM needs noted at this time.   Expected Discharge Date:  04/16/18               Expected Discharge Plan:  Home/Self Care  In-House Referral:  NA  Discharge planning Services  CM Consult  Post Acute Care Choice:  NA Choice offered to:  Patient  DME Arranged:    DME Agency:     HH Arranged:    [place] Agency:     Status of Service:  Completed, signed off  If discussed at [employer] of Stay Meetings, dates discussed:    Additional Comments:  Sherald Barge, RN 04/16/2018, 1:57 PM

## 2018-04-16 NOTE — Progress Notes (Signed)
Pt's IV catheter removed and intact. Pt's IV site clean dry and intact. Discharge instructions including medications and follow up appointments were reviewed and discussed with patient. All questions were answered and no further questions at this time. Pt in stable condition and in no acute distress at time of discharge. Pt will be escorted by nurse tech.

## 2018-04-16 NOTE — Evaluation (Signed)
Physical Therapy Evaluation Patient Details Name: Bryan [name] MRN: [id number] DOB: 18-Mar-1956 Today's Date: 04/16/2018   History of Present Illness  MANINDER DEBOER is a 62 y.o. male with a history of COPD, diabetes, chronic epigastric pain, history of TIA, hypertension.  Patient seen for 1 week of worsening dyspnea is worse with exertion and improved with rest.  His symptoms have been worsening.  Patient only able to ambulate approximately 50 yards without having to stop.  Denies coughing, fevers, chills, nausea, vomiting.    Clinical Impression  Patient functioning at baseline for functional mobility and gait.  Plan:  Patient discharged from physical therapy to care of nursing for ambulation daily as tolerated for length of stay.    Follow Up Recommendations No PT follow up    Equipment Recommendations  None recommended by PT    Recommendations for Other Services       Precautions / Restrictions Precautions Precautions: None Restrictions Weight Bearing Restrictions: No      Mobility  Bed Mobility Overal bed mobility: Independent                Transfers Overall transfer level: Independent                  Ambulation/Gait Ambulation/Gait assistance: Modified independent (Device/Increase time) Gait Distance (Feet): 200 Feet Assistive device: None Gait Pattern/deviations: WFL(Within Functional Limits) Gait velocity: near normal   General Gait Details: grossly WFL except slightly slower than normals and labored when going up inclines, patient on room air with O2 sats between 90-95%  Stairs            Wheelchair Mobility    Modified Rankin (Stroke Patients Only)       Balance Overall balance assessment: No apparent balance deficits (not formally assessed)                                           Pertinent Vitals/Pain Pain Assessment: No/denies pain    Home Living Family/patient expects to be discharged to:: Private  residence Living Arrangements: Children(son) Available Help at Discharge: Family Type of Home: House Home Access: Level entry     Home Layout: One level Home Equipment: Kasandra Knudsen - single point      Prior Function Level of Independence: Independent with assistive device(s)         Comments: community ambulator with [facility] PRN     Hand Dominance   Dominant Hand: Right    Extremity/Trunk Assessment   Upper Extremity Assessment Upper Extremity Assessment: Overall WFL for tasks assessed    Lower Extremity Assessment Lower Extremity Assessment: Overall WFL for tasks assessed    Cervical / Trunk Assessment Cervical / Trunk Assessment: Normal  Communication   Communication: No difficulties  Cognition Arousal/Alertness: Awake/alert Behavior During Therapy: WFL for tasks assessed/performed Overall Cognitive Status: Within Functional Limits for tasks assessed                                        General Comments      Exercises     Assessment/Plan    PT Assessment Patent does not need any further PT services  PT Problem List         PT Treatment Interventions      PT Goals (Current goals can  be found in the Care Plan section)  Acute Rehab PT Goals Patient Stated Goal: return home PT Goal Formulation: With patient/family Time For Goal Achievement: 04/16/18 Potential to Achieve Goals: Good    Frequency     Barriers to discharge        Co-evaluation               AM-PAC PT "6 Clicks" Daily Activity  Outcome Measure Difficulty turning over in bed (including adjusting bedclothes, sheets and blankets)?: None Difficulty moving from lying on back to sitting on the side of the bed? : None Difficulty sitting down on and standing up from a chair with arms (e.g., wheelchair, bedside commode, etc,.)?: None Help needed moving to and from a bed to chair (including a wheelchair)?: None Help needed walking in hospital room?: None Help needed  climbing 3-5 steps with a railing? : None 6 Click Score: 24    End of Session   Activity Tolerance: Patient tolerated treatment well Patient left: in bed;with call bell/phone within reach Nurse Communication: Mobility status PT Visit Diagnosis: Unsteadiness on feet (R26.81);Other abnormalities of gait and mobility (R26.89);Muscle weakness (generalized) (M62.81)    Time: 1120-1140 PT Time Calculation (min) (ACUTE ONLY): 20 min   Charges:   PT Evaluation $PT Eval Moderate Complexity: 1 Mod PT Treatments $Gait Training: 8-22 mins        2:26 PM, 04/16/18 Lonell Grandchild, MPT Physical Therapist with [facility] [phone] office [phone] mobile phone

## 2018-04-23 DIAGNOSIS — J9600 Acute respiratory failure, unspecified whether with hypoxia or hypercapnia: Secondary | ICD-10-CM | POA: Diagnosis not present

## 2018-04-23 DIAGNOSIS — I10 Essential (primary) hypertension: Secondary | ICD-10-CM | POA: Diagnosis not present

## 2018-04-23 DIAGNOSIS — J449 Chronic obstructive pulmonary disease, unspecified: Secondary | ICD-10-CM | POA: Diagnosis not present

## 2018-04-23 DIAGNOSIS — E1142 Type 2 diabetes mellitus with diabetic polyneuropathy: Secondary | ICD-10-CM | POA: Diagnosis not present

## 2018-04-27 ENCOUNTER — Encounter (HOSPITAL_COMMUNITY): Payer: Self-pay | Admitting: Emergency Medicine

## 2018-04-27 ENCOUNTER — Emergency Department (HOSPITAL_COMMUNITY)
Admission: EM | Admit: 2018-04-27 | Discharge: 2018-04-27 | Disposition: A | Discharge status: Home / Self Care | Payer: Medicare HMO | Attending: Emergency Medicine | Admitting: Emergency Medicine

## 2018-04-27 ENCOUNTER — Other Ambulatory Visit: Payer: Self-pay

## 2018-04-27 DIAGNOSIS — J45909 Unspecified asthma, uncomplicated: Secondary | ICD-10-CM | POA: Insufficient documentation

## 2018-04-27 DIAGNOSIS — Z7984 Long term (current) use of oral hypoglycemic drugs: Secondary | ICD-10-CM | POA: Insufficient documentation

## 2018-04-27 DIAGNOSIS — E1122 Type 2 diabetes mellitus with diabetic chronic kidney disease: Secondary | ICD-10-CM | POA: Diagnosis not present

## 2018-04-27 DIAGNOSIS — N183 Chronic kidney disease, stage 3 (moderate): Secondary | ICD-10-CM | POA: Insufficient documentation

## 2018-04-27 DIAGNOSIS — N400 Enlarged prostate without lower urinary tract symptoms: Secondary | ICD-10-CM

## 2018-04-27 DIAGNOSIS — Z87891 Personal history of nicotine dependence: Secondary | ICD-10-CM | POA: Diagnosis not present

## 2018-04-27 DIAGNOSIS — N401 Enlarged prostate with lower urinary tract symptoms: Secondary | ICD-10-CM | POA: Insufficient documentation

## 2018-04-27 DIAGNOSIS — Z79899 Other long term (current) drug therapy: Secondary | ICD-10-CM | POA: Diagnosis not present

## 2018-04-27 DIAGNOSIS — Z8673 Personal history of transient ischemic attack (TIA), and cerebral infarction without residual deficits: Secondary | ICD-10-CM | POA: Insufficient documentation

## 2018-04-27 DIAGNOSIS — R350 Frequency of micturition: Secondary | ICD-10-CM | POA: Diagnosis not present

## 2018-04-27 DIAGNOSIS — R300 Dysuria: Secondary | ICD-10-CM | POA: Diagnosis not present

## 2018-04-27 DIAGNOSIS — I129 Hypertensive chronic kidney disease with stage 1 through stage 4 chronic kidney disease, or unspecified chronic kidney disease: Secondary | ICD-10-CM | POA: Diagnosis not present

## 2018-04-27 LAB — CBC WITH DIFFERENTIAL/PLATELET
Basophils Absolute: 0 10*3/uL (ref 0.0–0.1)
Basophils Relative: 0 %
EOS ABS: 0.2 10*3/uL (ref 0.0–0.7)
Eosinophils Relative: 2 %
HEMATOCRIT: 37.4 % — AB (ref 39.0–52.0)
HEMOGLOBIN: 12.2 g/dL — AB (ref 13.0–17.0)
Lymphocytes Relative: 23 %
Lymphs Abs: 2.3 10*3/uL (ref 0.7–4.0)
MCH: 28.7 pg (ref 26.0–34.0)
MCHC: 32.6 g/dL (ref 30.0–36.0)
MCV: 88 fL (ref 78.0–100.0)
MONOS PCT: 11 %
Monocytes Absolute: 1.1 10*3/uL — ABNORMAL HIGH (ref 0.1–1.0)
NEUTROS ABS: 6.4 10*3/uL (ref 1.7–7.7)
NEUTROS PCT: 64 %
Platelets: 204 10*3/uL (ref 150–400)
RBC: 4.25 MIL/uL (ref 4.22–5.81)
RDW: 14.4 % (ref 11.5–15.5)
WBC: 9.9 10*3/uL (ref 4.0–10.5)

## 2018-04-27 LAB — URINALYSIS, ROUTINE W REFLEX MICROSCOPIC
BILIRUBIN URINE: NEGATIVE
Glucose, UA: NEGATIVE mg/dL
Hgb urine dipstick: NEGATIVE
Ketones, ur: NEGATIVE mg/dL
LEUKOCYTES UA: NEGATIVE
NITRITE: NEGATIVE
Protein, ur: NEGATIVE mg/dL
Specific Gravity, Urine: 1.016 (ref 1.005–1.030)
pH: 5 (ref 5.0–8.0)

## 2018-04-27 LAB — BASIC METABOLIC PANEL
Anion gap: 6 (ref 5–15)
BUN: 15 mg/dL (ref 8–23)
CALCIUM: 8.5 mg/dL — AB (ref 8.9–10.3)
CHLORIDE: 106 mmol/L (ref 98–111)
CO2: 28 mmol/L (ref 22–32)
CREATININE: 1.75 mg/dL — AB (ref 0.61–1.24)
GFR calc non Af Amer: 40 mL/min — ABNORMAL LOW (ref >60)
GFR, EST AFRICAN AMERICAN: 46 mL/min — AB (ref >60)
Glucose, Bld: 110 mg/dL — ABNORMAL HIGH (ref 70–99)
Potassium: 4 mmol/L (ref 3.5–5.1)
SODIUM: 140 mmol/L (ref 135–145)

## 2018-04-27 MED ORDER — PHENAZOPYRIDINE HCL 100 MG PO TABS: 100.0000 mg | ORAL_TABLET | Freq: Three times a day (TID) | ORAL | 0 refills | Status: DC | PRN

## 2018-04-27 MED ORDER — TRAMADOL HCL 50 MG PO TABS: 50.0000 mg | ORAL_TABLET | Freq: Four times a day (QID) | ORAL | 0 refills | Status: DC | PRN

## 2018-04-27 MED ORDER — TRAMADOL HCL 50 MG PO TABS
100.0000 mg | ORAL_TABLET | Freq: Once | ORAL | Status: AC
  Administered 2018-04-27: 100 mg via ORAL
  Filled 2018-04-27: qty 2

## 2018-04-27 MED ORDER — DOXYCYCLINE HYCLATE 100 MG PO TABS
100.0000 mg | ORAL_TABLET | Freq: Once | ORAL | Status: AC
  Administered 2018-04-27: 100 mg via ORAL
  Filled 2018-04-27: qty 1

## 2018-04-27 MED ORDER — ONDANSETRON HCL 4 MG PO TABS
4.0000 mg | ORAL_TABLET | Freq: Once | ORAL | Status: AC
  Administered 2018-04-27: 4 mg via ORAL
  Filled 2018-04-27: qty 1

## 2018-04-27 MED ORDER — DOXYCYCLINE HYCLATE 100 MG PO CAPS: 100.0000 mg | ORAL_CAPSULE | Freq: Two times a day (BID) | ORAL | 0 refills | Status: DC

## 2018-04-27 MED ORDER — PHENAZOPYRIDINE HCL 100 MG PO TABS
100.0000 mg | ORAL_TABLET | Freq: Once | ORAL | Status: AC
  Administered 2018-04-27: 100 mg via ORAL
  Filled 2018-04-27: qty 1

## 2018-04-27 NOTE — Discharge Instructions (Addendum)
Your vital signs are within normal limits.  Your blood test are negative for acute problem.  Your urine test does not show evidence of an infection, or kidney stone.  Your examination shows that your prostate gland is enlarged.  Please see your urology specialist as soon as possible for additional testing, and to seek the reason for so much pain and discomfort with urination.  Please use doxycycline 2 times daily with food.  Please use Pyridium with breakfast, lunch, and dinner.  Please use Ultram every 6 hours as needed for pain.

## 2018-04-27 NOTE — ED Triage Notes (Signed)
Patient complaining of burning with urination and pain to testicles after urination x 1 month.

## 2018-04-27 NOTE — ED Provider Notes (Signed)
[facility] EMERGENCY DEPARTMENT Provider Note   CSN: [id number] Arrival date & time: 04/27/18  1443     History   Chief Complaint Chief Complaint  Patient presents with  . Dysuria    HPI Bryan [name] is a 62 y.o. male.  Patient is a 62 year old male who presents to the emergency department with a complaint of dysuria.  The patient states over the last 1 to 1-1/2 months he has been having problems with burning in his from his penis and also pain in his testicles.  Patient states that he has to go to the bathroom 6 or 7 times each night, and as he urinates, the pain gets worse and worse.  He has not seen any blood in his urine.  He has not had any injury to his bladder, or genital area.  No recent operations or procedures reported in this area.  No nausea or vomiting.  Patient states that he has been seen by urology, he has had a biopsy done, but he has not been told what the results of the biopsy was, nor has he been told of any other issues going on with his GU area.  The patient denies fever or chills.  He presents now for assistance with his pain and discomfort.  The history is provided by the patient.  Dysuria   Associated symptoms include frequency. Pertinent negatives include no chills and no hematuria.    Past Medical History:  Diagnosis Date  . Acid reflux   . Arthritis   . Asthma   . Chronic pain    with leg and back pain (disc problem)  . COPD (chronic obstructive pulmonary disease) ([place])   . Diabetes mellitus without complication ([place])   . Headache    HX OF  . History of upper GI x-ray series    to follow showed large duodenal ulcer H pylori serologies were negative  . Hypertension   . Pneumonia    07/17/17  . Pre-diabetes   . Stroke [facility])    TIA MINI STROKE  . Tubular adenoma     Patient Active Problem List   Diagnosis Date Noted  . Acute respiratory failure with hypoxia ([place]) 04/14/2018  . Pulmonary nodule 04/14/2018  . CKD (chronic kidney disease),  stage III ([place]) 04/14/2018  . Type 2 diabetes mellitus with hyperlipidemia ([place]) 03/12/2018  . BPH with obstruction/lower urinary tract symptoms 08/17/2017  . Left sided numbness 07/22/2017  . TIA (transient ischemic attack) 07/22/2017  . Pneumonia 07/14/2017  . COPD with acute exacerbation ([place]) 07/14/2017  . Constipation 07/10/2017  . Hx of adenomatous colonic polyps 05/01/2017  . Hypertension   . Chronic pain   . Heme positive stool 10/20/2014  . Epigastric pain 06/19/2014  . Dysphagia 06/19/2014  . AP (abdominal pain) 11/13/2013  . Early satiety 11/13/2013  . COPD, severe ([place]) 02/22/2013  . Low back pain 11/19/2012  . COLITIS 05/12/2009  . ABDOMINAL PAIN, LEFT LOWER QUADRANT 05/12/2009  . DUODENAL ULCER, HX OF 05/12/2009  . ALCOHOL USE 05/11/2009  . GERD 05/11/2009  . Diarrhea 05/11/2009  . SPONDYLOSIS, CERVICAL 07/03/2007  . NECK PAIN 07/03/2007    Past Surgical History:  Procedure Laterality Date  . BACK SURGERY  7/05; 5/09    Dr.Hirsch,3 lumbar  X3  . BACK SURGERY    . COLONOSCOPY  Feb 2012   Dr. Gala Romney: normal rectum, pedunculate polyp removed but not recovered  . COLONOSCOPY WITH ESOPHAGOGASTRODUODENOSCOPY (EGD) N/A 11/18/2013   Dr.Rourk- tcs= normal  rectum, multipe polyps about the ileocecal valve and distal transverse colon o/w the remainder of the colonic mucosa appeared normal bx= tubular adenoma. EGD= normal esophagus, stomach with scattered erosions mottling, friablility, no ulcer or infiltrating process patent pylorus bx= chronic inflammation. next TCS 11/2016  . COLONOSCOPY WITH PROPOFOL N/A 05/25/2017   Dr. Gala Romney: sigmoid diverticulosis, one 4 mm hyerplastic rectal polyp, ascending colonic AVMs surveillance 2023  . ESOPHAGOGASTRODUODENOSCOPY  1/06   Dr. Volney American esophageal erosions,U-shaped stomach,marked erosions and edema of the bulb without discrete ulcer disease.   . ESOPHAGOGASTRODUODENOSCOPY (EGD) WITH PROPOFOL N/A 05/25/2017   Dr. Gala Romney: reflux  esophagitis s/p empiric dilation, normal stomach and duodenum  . MALONEY DILATION N/A 05/25/2017   Procedure: Venia Minks DILATION;  Surgeon: Daneil Dolin, MD;  [facility];  Service: Endoscopy;  Laterality: N/A;  . NECK SURGERY  10/2007   PLATE IN NECK  . POLYPECTOMY  05/25/2017   Procedure: POLYPECTOMY;  Surgeon: Daneil Dolin, MD;  Location: [facility] ENDO [facility]ITE;  Service: Endoscopy;;  rectal  . TRANSURETHRAL RESECTION OF PROSTATE N/A 08/17/2017   Procedure: TRANSURETHRAL RESECTION OF THE PROSTATE (TURP);  Surgeon: Irine Seal, MD;  Location: [facility] ORS;  Service: Urology;  Laterality: N/A;        Home Medications    Prior to Admission medications   Medication Sig Start Date End Date Taking? Authorizing Provider  albuterol (PROVENTIL HFA;VENTOLIN HFA) 108 (90 Base) MCG/ACT inhaler Inhale 1-2 puffs every 4 (four) hours as needed into the lungs for wheezing or shortness of breath. Do not use with nebulizer Patient taking differently: Inhale 1 puff into the lungs every 4 (four) hours as needed for wheezing or shortness of breath. Do not use with nebulizer 07/17/17   [name], MD  albuterol (PROVENTIL) (2.5 MG/3ML) 0.083% nebulizer solution Take 3 mLs (2.5 mg total) every 4 (four) hours as needed by nebulization for wheezing or shortness of breath. 07/17/17   [name], MD  amLODipine (NORVASC) 5 MG tablet Take 1 tablet (5 mg total) by mouth daily. 03/13/18 03/13/19  [name], MD  atorvastatin (LIPITOR) 40 MG tablet Take 1 tablet (40 mg total) daily by mouth. 07/23/17 07/23/18  [name], MD  blood glucose meter kit and supplies Dispense based on patient and insurance preference. Use up to four times daily as directed. (FOR ICD-9 250.00, 250.01). 07/23/17   [name], MD  doxycycline (VIBRAMYCIN) 100 MG capsule Take 1 capsule (100 mg total) by mouth 2 (two) times daily. 04/27/18   [name], PA-C  gabapentin (NEURONTIN) 300 MG capsule Take 300 mg by  mouth 3 (three) times daily.     [provider]  ipratropium-albuterol (DUONEB) 0.5-2.5 (3) MG/3ML SOLN Take 3 mLs every 6 (six) hours as needed by nebulization (wheezing). 07/17/17   [name], MD  metFORMIN (GLUCOPHAGE) 500 MG tablet Take 1 tablet (500 mg total) daily with breakfast by mouth. 07/23/17 07/23/18  [name], MD  mometasone-formoterol (DULERA) 200-5 MCG/ACT AERO Inhale 1 puff into the lungs 2 (two) times daily. 04/16/18   Manuella Ghazi, Pratik D, DO  pantoprazole (PROTONIX) 40 MG tablet Take 1 tablet (40 mg total) by mouth 2 (two) times daily before a meal. 03/19/18   [name], NP  phenazopyridine (PYRIDIUM) 100 MG tablet Take 1 tablet (100 mg total) by mouth 3 (three) times daily as needed for pain. 04/27/18   [name], PA-C  tamsulosin (FLOMAX) 0.4 MG CAPS capsule Take 1 capsule  by mouth daily. 12/18/17   [provider]  traMADol (ULTRAM) 50 MG tablet Take 1 tablet (50 mg total) by mouth every 6 (six) hours as needed. 04/27/18   [name], PA-C    Family History Family History  Problem Relation Age of Onset  . Cancer Father   . Asthma Mother   . Colon cancer Neg Hx     Social History Social History   Tobacco Use  . Smoking status: Former Smoker    Packs/day: 0.50    Years: 40.00    Pack years: 20.00    Types: Cigarettes    Last attempt to quit: 11/11/2014    Years since quitting: 3.4  . Smokeless tobacco: Never Used  Substance Use Topics  . Alcohol use: No    Alcohol/week: 0.0 standard drinks  . Drug use: Yes    Types: Marijuana    Comment:  'every once in a while"      Allergies   Patient has no known allergies.   Review of Systems Review of Systems  Constitutional: Negative for activity change, chills and fever.       All ROS Neg except as noted in HPI  HENT: Negative for nosebleeds.   Eyes: Negative for photophobia and discharge.  Respiratory: Negative for cough, shortness of breath and wheezing.   Cardiovascular:  Negative for chest pain and palpitations.  Gastrointestinal: Negative for abdominal pain and blood in stool.  Genitourinary: Positive for dysuria, frequency and penile pain. Negative for discharge, hematuria and scrotal swelling.  Musculoskeletal: Negative for arthralgias, back pain and neck pain.  Skin: Negative.   Neurological: Negative for dizziness, seizures and speech difficulty.  Psychiatric/Behavioral: Negative for confusion and hallucinations.     Physical Exam Updated Vital Signs BP (!) 145/75 (BP Location: Right Arm)   Pulse 88   Temp 98.7 F (37.1 C) (Oral)   Resp 18   Ht _0  (1.6 m)   Wt 53.2 kg   SpO2 95%   BMI 20.78 kg/m   Physical Exam  Constitutional: He is oriented to person, place, and time. He appears well-developed and well-nourished.  Non-toxic appearance.  HENT:  Head: Normocephalic.  Right Ear: Tympanic membrane and external ear normal.  Left Ear: Tympanic membrane and external ear normal.  Eyes: Pupils are equal, round, and reactive to light. EOM and lids are normal.  Neck: Normal range of motion. Neck supple. Carotid bruit is not present.  Cardiovascular: Normal rate, regular rhythm, normal heart sounds, intact distal pulses and normal pulses.  Pulmonary/Chest: Breath sounds normal. No respiratory distress.  Abdominal: Soft. Bowel sounds are normal. There is no tenderness. There is no guarding.  Genitourinary:  Genitourinary Comments: Chaperone present during the examination.  There is a skin tag from an old hemorrhoid present on rectal examination.  There is no evidence of fistula.  There is no unusual rash in the rectal area.  There is no rash or abnormality involving the scrotal area.  The testicles are descended bilaterally.  There are no lesions noted on the shaft of the penis.  The patient is uncircumcised.  There is no evidence of any lesions of the foreskin.  The foreskin is easily movable backwards and forward.  There is no rash of the head of  the penis.  There is no swelling of the head of the penis.  There is no discharge from the urethra noted at this time.  The prostate is significantly enlarged.  There is some tenderness to the  prostate examination, but the patient states that the pain that he has is much different than what he is feeling during the examination.  Musculoskeletal: Normal range of motion.  Lymphadenopathy:       Head (right side): No submandibular adenopathy present.       Head (left side): No submandibular adenopathy present.    He has no cervical adenopathy.  Neurological: He is alert and oriented to person, place, and time. He has normal strength. No cranial nerve deficit or sensory deficit.  Skin: Skin is warm and dry.  Psychiatric: He has a normal mood and affect. His speech is normal.  Nursing note and vitals reviewed.    ED Treatments / Results  Labs (all labs ordered are listed, but only abnormal results are displayed) Labs Reviewed  CBC WITH DIFFERENTIAL/PLATELET - Abnormal; Notable for the following components:      Result Value   Hemoglobin 12.2 (*)    HCT 37.4 (*)    Monocytes Absolute 1.1 (*)    All other components within normal limits  BASIC METABOLIC PANEL - Abnormal; Notable for the following components:   Glucose, Bld 110 (*)    Creatinine, Ser 1.75 (*)    Calcium 8.5 (*)    GFR calc non Af Amer 40 (*)    GFR calc Af Amer 46 (*)    All other components within normal limits  URINE CULTURE  URINALYSIS, ROUTINE W REFLEX MICROSCOPIC    EKG None  Radiology No results found.  Procedures Procedures (including critical care time)  Medications Ordered in ED Medications  phenazopyridine (PYRIDIUM) tablet 100 mg (has no administration in time range)  traMADol (ULTRAM) tablet 100 mg (has no administration in time range)  ondansetron (ZOFRAN) tablet 4 mg (has no administration in time range)  doxycycline (VIBRA-TABS) tablet 100 mg (has no administration in time range)      Initial Impression / Assessment and Plan / ED Course  I have reviewed the triage vital signs and the nursing notes.  Pertinent labs & imaging results that were available during my care of the patient were reviewed by me and considered in my medical decision making (see chart for details).      Final Clinical Impressions(s) / ED Diagnoses MDM  Vital signs are within normal limits.  The complete blood count in the urine analysis nonacute at this time.  A culture of the urine has been sent to the lab.  The prostate gland is enlarged.  The patient will be treated with Pyridium, doxycycline, and Ultram for pain.  I have asked the patient to see the urology specialist as soon as possible next week for additional evaluation.  I discussed the test with the patient in terms of which he and his significant other understand.  They are in agreement with this plan.   Final diagnoses:  Dysuria  Enlarged prostate    ED Discharge Orders         Ordered    phenazopyridine (PYRIDIUM) 100 MG tablet  3 times daily PRN     04/27/18 1659    traMADol (ULTRAM) 50 MG tablet  Every 6 hours PRN     04/27/18 1659    doxycycline (VIBRAMYCIN) 100 MG capsule  2 times daily     04/27/18 1702           [name], PA-C 04/27/18 1717    Davonna Belling, MD 04/28/18 0002

## 2018-04-29 LAB — URINE CULTURE
CULTURE: NO GROWTH
Special Requests: NORMAL

## 2018-05-04 ENCOUNTER — Ambulatory Visit (INDEPENDENT_AMBULATORY_CARE_PROVIDER_SITE_OTHER): Payer: Medicare HMO | Admitting: Urology

## 2018-05-04 DIAGNOSIS — N3941 Urge incontinence: Secondary | ICD-10-CM | POA: Diagnosis not present

## 2018-05-04 DIAGNOSIS — R351 Nocturia: Secondary | ICD-10-CM

## 2018-05-04 DIAGNOSIS — N50812 Left testicular pain: Secondary | ICD-10-CM

## 2018-05-04 DIAGNOSIS — N183 Chronic kidney disease, stage 3 (moderate): Secondary | ICD-10-CM | POA: Diagnosis not present

## 2018-05-08 ENCOUNTER — Ambulatory Visit (INDEPENDENT_AMBULATORY_CARE_PROVIDER_SITE_OTHER): Payer: Medicare HMO | Admitting: Gastroenterology

## 2018-05-08 ENCOUNTER — Other Ambulatory Visit: Payer: Self-pay | Admitting: Urology

## 2018-05-08 ENCOUNTER — Encounter: Payer: Self-pay | Admitting: Gastroenterology

## 2018-05-08 VITALS — BP 111/70 | HR 76 | Temp 97.8°F | Ht 63.0 in | Wt 120.0 lb

## 2018-05-08 DIAGNOSIS — N50812 Left testicular pain: Secondary | ICD-10-CM

## 2018-05-08 DIAGNOSIS — K2100 Gastro-esophageal reflux disease with esophagitis, without bleeding: Secondary | ICD-10-CM

## 2018-05-08 DIAGNOSIS — K5900 Constipation, unspecified: Secondary | ICD-10-CM | POA: Diagnosis not present

## 2018-05-08 DIAGNOSIS — N183 Chronic kidney disease, stage 3 unspecified: Secondary | ICD-10-CM

## 2018-05-08 MED ORDER — LINACLOTIDE 72 MCG PO CAPS: 72.0000 ug | ORAL_CAPSULE | Freq: Every day | ORAL | 3 refills | Status: DC

## 2018-05-08 NOTE — Patient Instructions (Signed)
Continue Protonix twice a day, 30 minutes before breakfast and dinner.  I have sent in [place] to take once each morning, 30 minutes before breakfast. We have also provided samples.   Let me know if abdominal pain continues or worsens. We will see you in 3 months!  It was a pleasure to see you today. I strive to create trusting relationships with patients to provide genuine, compassionate, and quality care. I value your feedback. If you receive a survey regarding your visit,  I greatly appreciate you taking time to fill this out.   [name], PhD, ANP-BC [facility]

## 2018-05-08 NOTE — Progress Notes (Signed)
Referring Provider: Rosita Fire, MD Primary Care Physician:  Rosita Fire, MD  Primary GI: Dr. Gala Romney   Chief Complaint  Patient presents with  . Dysphagia    improved, occas has an issuea  . Abdominal Pain    upper abd pain comes/goes    HPI:   Bryan [name] is a 62 y.o. male presenting today with a history of chronic abdominal pain, last seen July 2019. He reported dysphagia despite empiric dilation in Sept 2018. Known esophagitis. BPE completed without motility issues. No stricture. Due to abdominal pain, HIDA completed and normal with EF 92%. CT without contrast May 2019 with wall thickening throughout sigmoid without focal inflammatory changes, possibly due to underdistension, unable to exclude low grade colitis. (last colonoscopy Sept 2018) I discussed with radiologist and low likelihood of infectious process. Pancreas unremarkable on non-contrast exam. He was hospitalized in July due to abdominal/chest pain. Negative cardiac markers.  Weight 120 today. Was 115 at last visit. Linzess decreased to 72 mcg at last visit. This worked well for him. States pain comes and goes but a lot better than it was. Needs more Linzess. No rectal bleeding. Good appetite. Protonix BID. Feels good after a BM in upper abdomen.   CTA chest 4 mm nodule in left lower lobe. Recommend non-contrast chest CT in 12 months. History of smoking 1-1.5 ppd X 45 years, no smoking in 4 years. No family history of lung cancer.   Past Medical History:  Diagnosis Date  . Acid reflux   . Arthritis   . Asthma   . Chronic pain    with leg and back pain (disc problem)  . COPD (chronic obstructive pulmonary disease) ([place])   . Diabetes mellitus without complication ([place])   . Headache    HX OF  . History of upper GI x-ray series    to follow showed large duodenal ulcer H pylori serologies were negative  . Hypertension   . Pneumonia    07/17/17  . Pre-diabetes   . Stroke [facility])    TIA MINI STROKE  . Tubular  adenoma     Past Surgical History:  Procedure Laterality Date  . BACK SURGERY  7/05; 5/09    Dr.Hirsch,3 lumbar  X3  . BACK SURGERY    . COLONOSCOPY  Feb 2012   Dr. Gala Romney: normal rectum, pedunculate polyp removed but not recovered  . COLONOSCOPY WITH ESOPHAGOGASTRODUODENOSCOPY (EGD) N/A 11/18/2013   Dr.Rourk- tcs= normal rectum, multipe polyps about the ileocecal valve and distal transverse colon o/w the remainder of the colonic mucosa appeared normal bx= tubular adenoma. EGD= normal esophagus, stomach with scattered erosions mottling, friablility, no ulcer or infiltrating process patent pylorus bx= chronic inflammation. next TCS 11/2016  . COLONOSCOPY WITH PROPOFOL N/A 05/25/2017   Dr. Gala Romney: sigmoid diverticulosis, one 4 mm hyerplastic rectal polyp, ascending colonic AVMs surveillance 2023  . ESOPHAGOGASTRODUODENOSCOPY  1/06   Dr. Volney American esophageal erosions,U-shaped stomach,marked erosions and edema of the bulb without discrete ulcer disease.   . ESOPHAGOGASTRODUODENOSCOPY (EGD) WITH PROPOFOL N/A 05/25/2017   Dr. Gala Romney: reflux esophagitis s/p empiric dilation, normal stomach and duodenum  . MALONEY DILATION N/A 05/25/2017   Procedure: Venia Minks DILATION;  Surgeon: Daneil Dolin, MD;  [facility];  Service: Endoscopy;  Laterality: N/A;  . NECK SURGERY  10/2007   PLATE IN NECK  . POLYPECTOMY  05/25/2017   Procedure: POLYPECTOMY;  Surgeon: Daneil Dolin, MD;  [facility];  Service: Endoscopy;;  rectal  .  TRANSURETHRAL RESECTION OF PROSTATE N/A 08/17/2017   Procedure: TRANSURETHRAL RESECTION OF THE PROSTATE (TURP);  Surgeon: Irine Seal, MD;  Location: [facility];  Service: Urology;  Laterality: N/A;    Current Outpatient Medications  Medication Sig Dispense Refill  . albuterol (PROVENTIL HFA;VENTOLIN HFA) 108 (90 Base) MCG/ACT inhaler Inhale 1-2 puffs every 4 (four) hours as needed into the lungs for wheezing or shortness of breath. Do not use with nebulizer (Patient  taking differently: Inhale 1 puff into the lungs every 4 (four) hours as needed for wheezing or shortness of breath. Do not use with nebulizer) 1 Inhaler 0  . albuterol (PROVENTIL) (2.5 MG/3ML) 0.083% nebulizer solution Take 3 mLs (2.5 mg total) every 4 (four) hours as needed by nebulization for wheezing or shortness of breath. 75 mL 0  . amLODipine (NORVASC) 5 MG tablet Take 1 tablet (5 mg total) by mouth daily. 30 tablet 11  . atorvastatin (LIPITOR) 40 MG tablet Take 1 tablet (40 mg total) daily by mouth. 30 tablet 11  . blood glucose meter kit and supplies Dispense based on patient and insurance preference. Use up to four times daily as directed. (FOR ICD-9 250.00, 250.01). 1 each 0  . doxycycline (VIBRAMYCIN) 100 MG capsule Take 1 capsule (100 mg total) by mouth 2 (two) times daily. 14 capsule 0  . gabapentin (NEURONTIN) 300 MG capsule Take 300 mg by mouth 3 (three) times daily.     Marland Kitchen ipratropium-albuterol (DUONEB) 0.5-2.5 (3) MG/3ML SOLN Take 3 mLs every 6 (six) hours as needed by nebulization (wheezing). 360 mL 0  . metFORMIN (GLUCOPHAGE) 500 MG tablet Take 1 tablet (500 mg total) daily with breakfast by mouth. 30 tablet 0  . mometasone-formoterol (DULERA) 200-5 MCG/ACT AERO Inhale 1 puff into the lungs 2 (two) times daily. 8.8 g 0  . pantoprazole (PROTONIX) 40 MG tablet Take 1 tablet (40 mg total) by mouth 2 (two) times daily before a meal. 60 tablet 5  . phenazopyridine (PYRIDIUM) 100 MG tablet Take 1 tablet (100 mg total) by mouth 3 (three) times daily as needed for pain. 10 tablet 0  . tamsulosin (FLOMAX) 0.4 MG CAPS capsule Take 1 capsule by mouth daily.    . traMADol (ULTRAM) 50 MG tablet Take 1 tablet (50 mg total) by mouth every 6 (six) hours as needed. 12 tablet 0   No current facility-administered medications for this visit.     Allergies as of 05/08/2018  . (No Known Allergies)    Family History  Problem Relation Age of Onset  . Cancer Father   . Asthma Mother   . Colon  cancer Neg Hx     Social History   Socioeconomic History  . Marital status: Single    Spouse name: Not on file  . Number of children: 2  . Years of education: Not on file  . Highest education level: Not on file  Occupational History  . Occupation: disabled    Fish farm manager: UNEMPLOYED  Social Needs  . Financial resource strain: Not on file  . Food insecurity:    Worry: Not on file    Inability: Not on file  . Transportation needs:    Medical: Not on file    Non-medical: Not on file  Tobacco Use  . Smoking status: Former Smoker    Packs/day: 0.50    Years: 40.00    Pack years: 20.00    Types: Cigarettes    Last attempt to quit: 11/11/2014    Years since quitting: 3.4  .  Smokeless tobacco: Never Used  Substance and Sexual Activity  . Alcohol use: No    Alcohol/week: 0.0 standard drinks  . Drug use: Yes    Types: Marijuana    Comment:  'every once in a while"   . Sexual activity: Not Currently    Birth control/protection: None  Lifestyle  . Physical activity:    Days per week: Not on file    Minutes per session: Not on file  . Stress: Not on file  Relationships  . Social connections:    Talks on phone: Not on file    Gets together: Not on file    Attends religious service: Not on file    Active member of club or organization: Not on file    Attends meetings of clubs or organizations: Not on file    Relationship status: Not on file  Other Topics Concern  . Not on file  Social History Narrative   Lives with girlfriend, is on disability for history of back injuries.    Review of Systems: Gen: Denies fever, chills, anorexia. Denies fatigue, weakness, weight loss.  CV: Denies chest pain, palpitations, syncope, peripheral edema, and claudication. Resp: Denies dyspnea at rest, cough, wheezing, coughing up blood, and pleurisy. GI: see HPI  Derm: Denies rash, itching, dry skin Psych: Denies depression, anxiety, memory loss, confusion. No homicidal or suicidal ideation.    Heme: Denies bruising, bleeding, and enlarged lymph nodes.  Physical Exam: BP 111/70   Pulse 76   Temp 97.8 F (36.6 C) (Oral)   Ht _0  (1.6 m)   Wt 120 lb (54.4 kg)   BMI 21.26 kg/m  General:   Alert and oriented. No distress noted. Pleasant and cooperative.  Head:  Normocephalic and atraumatic. Eyes:  Conjuctiva clear without scleral icterus. Mouth:  Oral mucosa pink and moist.  Abdomen:  +BS, soft, non-tender and non-distended. No rebound or guarding. No HSM or masses noted. Msk:  Symmetrical without gross deformities. Normal posture. Extremities:  Without edema. Neurologic:  Alert and  oriented x4 Psych:  Alert and cooperative. Normal mood and affect.

## 2018-05-09 ENCOUNTER — Other Ambulatory Visit: Payer: Self-pay

## 2018-05-09 NOTE — Assessment & Plan Note (Signed)
Doing well on Protonix BID. No alarm symptoms. Return in 3 months.

## 2018-05-09 NOTE — Assessment & Plan Note (Signed)
Responded well to Linzess 72 mcg. Continue this. As of note, recommend repeat CT chest in 12 months due to left lower lobe nodule. Return in 3 months.

## 2018-05-09 NOTE — Patient Outreach (Signed)
[facility]) Care Management  05/09/2018   Bryan [name] [birth year] [id number]  Subjective: Telephone call placed to the patient for assessment. HIPAA verified. The patient states that he has been doing fine. He denies any pain or falls.  He did not check his blood sugar this morning but yesterday it was 98.  The patient states that he has not taken his metformin because it makes his mouth very dry.  I encouraged the patient to continue his medication and speak with his physician about the side effects.  I also encouraged him to check his blood sugars daily to monitor  for an increase since he is not taking his medications.  The patient verbalized understanding.  The patient also verbalized that his medications are costing more and will not be able to continue to pay the increase.  I advised the patient that I will put in a referral to out pharmacist to have them review his medications.  The patient states that his next appointment with Dr. Legrand Rams to check his A1c is in three months.    Current Medications:  Current Outpatient Medications  Medication Sig Dispense Refill  . albuterol (PROVENTIL HFA;VENTOLIN HFA) 108 (90 Base) MCG/ACT inhaler Inhale 1-2 puffs every 4 (four) hours as needed into the lungs for wheezing or shortness of breath. Do not use with nebulizer (Patient taking differently: Inhale 1 puff into the lungs every 4 (four) hours as needed for wheezing or shortness of breath. Do not use with nebulizer) 1 Inhaler 0  . albuterol (PROVENTIL) (2.5 MG/3ML) 0.083% nebulizer solution Take 3 mLs (2.5 mg total) every 4 (four) hours as needed by nebulization for wheezing or shortness of breath. 75 mL 0  . amLODipine (NORVASC) 5 MG tablet Take 1 tablet (5 mg total) by mouth daily. 30 tablet 11  . atorvastatin (LIPITOR) 40 MG tablet Take 1 tablet (40 mg total) daily by mouth. 30 tablet 11  . blood glucose meter kit and supplies Dispense based on patient and insurance preference. Use  up to four times daily as directed. (FOR ICD-9 250.00, 250.01). 1 each 0  . gabapentin (NEURONTIN) 300 MG capsule Take 300 mg by mouth 3 (three) times daily.     Marland Kitchen ipratropium-albuterol (DUONEB) 0.5-2.5 (3) MG/3ML SOLN Take 3 mLs every 6 (six) hours as needed by nebulization (wheezing). 360 mL 0  . linaclotide (LINZESS) 72 MCG capsule Take 1 capsule (72 mcg total) by mouth daily before breakfast. 90 capsule 3  . metFORMIN (GLUCOPHAGE) 500 MG tablet Take 1 tablet (500 mg total) daily with breakfast by mouth. 30 tablet 0  . mometasone-formoterol (DULERA) 200-5 MCG/ACT AERO Inhale 1 puff into the lungs 2 (two) times daily. 8.8 g 0  . pantoprazole (PROTONIX) 40 MG tablet Take 1 tablet (40 mg total) by mouth 2 (two) times daily before a meal. 60 tablet 5  . phenazopyridine (PYRIDIUM) 100 MG tablet Take 1 tablet (100 mg total) by mouth 3 (three) times daily as needed for pain. 10 tablet 0  . tamsulosin (FLOMAX) 0.4 MG CAPS capsule Take 1 capsule by mouth daily.    . traMADol (ULTRAM) 50 MG tablet Take 1 tablet (50 mg total) by mouth every 6 (six) hours as needed. 12 tablet 0  . doxycycline (VIBRAMYCIN) 100 MG capsule Take 1 capsule (100 mg total) by mouth 2 (two) times daily. (Patient not taking: Reported on 05/09/2018) 14 capsule 0   No current facility-administered medications for this visit.     Functional Status:  In your present state of health, do you have any difficulty performing the following activities: 04/14/2018 03/12/2018  Hearing? N N  Vision? N N  Comment - -  Difficulty concentrating or making decisions? N N  Walking or climbing stairs? N N  Comment - -  Dressing or bathing? N N  Doing errands, shopping? N Y  Comment - -  Some recent data might be hidden    Fall/Depression Screening: Fall Risk  05/09/2018 04/04/2018 03/01/2018  Falls in the past year? No No No   PHQ 2/9 Scores 08/09/2017  PHQ - 2 Score 0    Assessment: Patient will continue to benefit from health coach outreach  for disease management and support.  THN CM Care Plan Problem One     Most Recent Value  THN Long Term Goal   In 90 days the patient will verbalize that he has maintained his a1c at 6.5  [facility] Long Term Goal Start Date  05/09/18  Interventions for Problem One Long Term Goal  Discussed medication management and cbg readings     Plan:  [facility] will contact patient in the month of November and patient agrees to next outreach.  [name] RN, BSN, [place] Direct Dial:  [phone]  Fax: [phone]

## 2018-05-10 ENCOUNTER — Other Ambulatory Visit: Payer: Self-pay

## 2018-05-10 ENCOUNTER — Other Ambulatory Visit: Payer: Self-pay | Admitting: Pharmacy Technician

## 2018-05-10 NOTE — Progress Notes (Signed)
CC'D TO PCP °

## 2018-05-10 NOTE — Patient Outreach (Addendum)
[name]) Care Management  [place]   05/10/2018  Bryan [name] 06-27-1956 [id number]   62 year old male referred to [place] Management.  [place] services requested for medication assistance for Trelegy Ellipta inhaler, Linaclotide and Oxybutynin.  PMHx includes, but not limited to, hypertension, TIA, COPD, GERD, type 2 diabetes mellitus, hyperlipidemia and BPH.  Successful outreach attempt to Bryan [name].  HIPAA identifers verified.   Subjective: Bryan [name] reports that he is having trouble affording his medications.  He states that has been getting Trelegy samples, but now is having difficulty affording Linaclotide and Oxybutynin.  He reports that his pension stopped this month once he turned 89.    Objective:  SCr 1.75 mg/dL on 04/27/18 HgA1c 6.9% on 03/12/18  Current Medications: Current Outpatient Medications  Medication Sig Dispense Refill  . albuterol (PROVENTIL HFA;VENTOLIN HFA) 108 (90 Base) MCG/ACT inhaler Inhale 1-2 puffs every 4 (four) hours as needed into the lungs for wheezing or shortness of breath. Do not use with nebulizer (Patient taking differently: Inhale 1 puff into the lungs every 4 (four) hours as needed for wheezing or shortness of breath. Do not use with nebulizer) 1 Inhaler 0  . albuterol (PROVENTIL) (2.5 MG/3ML) 0.083% nebulizer solution Take 3 mLs (2.5 mg total) every 4 (four) hours as needed by nebulization for wheezing or shortness of breath. 75 mL 0  . amLODipine (NORVASC) 5 MG tablet Take 1 tablet (5 mg total) by mouth daily. 30 tablet 11  . atorvastatin (LIPITOR) 40 MG tablet Take 1 tablet (40 mg total) daily by mouth. 30 tablet 11  . blood glucose meter kit and supplies Dispense based on patient and insurance preference. Use up to four times daily as directed. (FOR ICD-9 250.00, 250.01). 1 each 0  . doxycycline (VIBRA-TABS) 100 MG tablet Take 100 mg by mouth 2 (two) times daily. Has 5 pills left on 05/10/18/    .  Fluticasone-Umeclidin-Vilant (TRELEGY ELLIPTA) 100-62.5-25 MCG/INH AEPB Inhale 1 puff into the lungs daily.    Marland Kitchen gabapentin (NEURONTIN) 300 MG capsule Take 300 mg by mouth 3 (three) times daily. Pt. Says he is taking twice daily.    Marland Kitchen linaclotide (LINZESS) 72 MCG capsule Take 1 capsule (72 mcg total) by mouth daily before breakfast. 90 capsule 3  . metFORMIN (GLUCOPHAGE) 500 MG tablet Take 1 tablet (500 mg total) daily with breakfast by mouth. 30 tablet 0  . oxybutynin (DITROPAN-XL) 10 MG 24 hr tablet Take 10 mg by mouth daily.    . pantoprazole (PROTONIX) 40 MG tablet Take 1 tablet (40 mg total) by mouth 2 (two) times daily before a meal. 60 tablet 5  . tamsulosin (FLOMAX) 0.4 MG CAPS capsule Take 1 capsule by mouth daily.    . traMADol (ULTRAM) 50 MG tablet Take 1 tablet (50 mg total) by mouth every 6 (six) hours as needed. 12 tablet 0  . phenazopyridine (PYRIDIUM) 100 MG tablet Take 1 tablet (100 mg total) by mouth 3 (three) times daily as needed for pain. (Patient not taking: Reported on 05/10/2018) 10 tablet 0   No current facility-administered medications for this visit.     Functional Status: In your present state of health, do you have any difficulty performing the following activities: 04/14/2018 03/12/2018  Hearing? N N  Vision? N N  Comment - -  Difficulty concentrating or making decisions? N N  Walking or climbing stairs? N N  Comment - -  Dressing or bathing? N N  Doing errands, shopping? N Y  Comment - -  Some recent data might be hidden    Fall/Depression Screening: Fall Risk  05/09/2018 04/04/2018 03/01/2018  Falls in the past year? No No No   PHQ 2/9 Scores 08/09/2017  PHQ - 2 Score 0   ASSESSMENT: Date Discharged from Hospital: 04/16/18 Date Medication Reconciliation Performed: 05/10/2018  New Medications at Discharge:   Prednisone x 5 more day  Dulera (but pt had been on Trelegy Ellipta samples from Dr. Legrand Rams.)  Pt continued Trelegy.  Patient was recently  discharged from hospital and all medications have been reviewed  Drugs sorted by system:  Cardiovascular: amlodipine, atorvastatin,   Pulmonary/Allergy: albuterol MDI, albuterol nebs, fluticasone/umeclidin/vilant  Gastrointestinal: linaclotide, pantoprazole,   Endocrine: metformin  Renal:oxybutynin, phenazopyridine, tamsulosin  Pain: gabapentin, tramadol  Infectious Diseases: doxycycline  Medication Assistance: Per [employer], [name] has spent $228.34 out of pocket (OOP) on prescriptions to date.  Patient may be eligible for Linaclotide  patient assistance from [place].  He is not eligible for Trelegy or oxybutynin assistance until he has spent $600 and $525 respectively.  Informed [name] that in 2-3 months and we can fill out Extra Help LIS application.  We need to wait until his pension is no longer showing as part of his monthly income.  PLAN: Route letter to Etter Sjogren to begin patient assistance for Linaclotide made by [employer].   Joetta Manners, PharmD Clinical Pharmacist [place] [phone]

## 2018-05-10 NOTE — Patient Outreach (Signed)
[name] [facility]) Care Management  05/10/2018  Bryan [name] Jun 07, 1956 [id number]   Received [employer] patient assistance referral from [place] for [place]. Prepared patient portion to be mailed and faxed provider portion A. [place].  Will follow up with patient in 5-7 business days to confirm application has been received.  Maud Deed [place], [place] Management [phone]

## 2018-05-16 DIAGNOSIS — J449 Chronic obstructive pulmonary disease, unspecified: Secondary | ICD-10-CM | POA: Diagnosis not present

## 2018-05-16 DIAGNOSIS — R0602 Shortness of breath: Secondary | ICD-10-CM | POA: Diagnosis not present

## 2018-05-16 DIAGNOSIS — J441 Chronic obstructive pulmonary disease with (acute) exacerbation: Secondary | ICD-10-CM | POA: Diagnosis not present

## 2018-05-16 DIAGNOSIS — J4520 Mild intermittent asthma, uncomplicated: Secondary | ICD-10-CM | POA: Diagnosis not present

## 2018-05-17 ENCOUNTER — Telehealth: Payer: Self-pay

## 2018-05-17 NOTE — Telephone Encounter (Signed)
PT assistance forms were received from [employer] and faxed back today. Ok per AB. Waiting on an approval or denial.

## 2018-05-18 ENCOUNTER — Ambulatory Visit: Payer: Medicare HMO | Admitting: Urology

## 2018-05-21 ENCOUNTER — Other Ambulatory Visit: Payer: Self-pay

## 2018-05-21 NOTE — Patient Outreach (Signed)
[facility]) Care Management  05/21/2018  KOHEI ANTONELLIS 1956-02-10 [id number]  62year old malereferred to [place] Management. [place] services requested for medication assistance for Trelegy Ellipta inhaler, Linaclotide and Oxybutynin. PMHx includes, but not limited to, hypertension, TIA, COPD, GERD, type 2 diabetes mellitus, hyperlipidemia and BPH.  Unsuccessful outreach attempt to Mr. Berisha.  Left HIPAA compliant voice message requesting a return call.  Plan: Outreach attempt in 3-4 business days to see if he has received his patient assistance applications.   Joetta Manners, PharmD Clinical Pharmacist [place] [phone]

## 2018-05-23 ENCOUNTER — Ambulatory Visit (HOSPITAL_COMMUNITY)
Admission: RE | Admit: 2018-05-23 | Discharge: 2018-05-23 | Disposition: A | Discharge status: Home / Self Care | Payer: Medicare HMO | Source: Ambulatory Visit | Attending: Urology | Admitting: Urology

## 2018-05-23 DIAGNOSIS — I700 Atherosclerosis of aorta: Secondary | ICD-10-CM | POA: Insufficient documentation

## 2018-05-23 DIAGNOSIS — N183 Chronic kidney disease, stage 3 unspecified: Secondary | ICD-10-CM

## 2018-05-23 DIAGNOSIS — N189 Chronic kidney disease, unspecified: Secondary | ICD-10-CM | POA: Diagnosis not present

## 2018-05-23 DIAGNOSIS — J432 Centrilobular emphysema: Secondary | ICD-10-CM | POA: Insufficient documentation

## 2018-05-23 DIAGNOSIS — N50812 Left testicular pain: Secondary | ICD-10-CM | POA: Diagnosis not present

## 2018-05-23 DIAGNOSIS — Z981 Arthrodesis status: Secondary | ICD-10-CM | POA: Diagnosis not present

## 2018-05-24 ENCOUNTER — Ambulatory Visit: Payer: Self-pay

## 2018-05-24 ENCOUNTER — Other Ambulatory Visit: Payer: Self-pay

## 2018-05-24 NOTE — Patient Outreach (Signed)
[facility]) Care Management  05/24/2018  BARUCH LEWERS October 30, 1955 [id number]  62year old malereferred to [place] Management. [place] services requested for medication assistance for Trelegy Ellipta inhaler, Linaclotide and Oxybutynin. PMHx includes, but not limited to, hypertension, TIA, COPD, GERD, type 2 diabetes mellitus, hyperlipidemia and BPH.  Successful outreach attempt to Mr. Chaves.  HIPAA identifers verified.   Medication Assistance: Mr. Rattigan reports that he has not received his patient assistance application for Linaclotide mailed on 05/10/18.  Informed him that it will arrive in an envelope from [employer], to please be on the lookout.  Requested that he complete and return the application as soon as possible when he receives it.    Plan: Route note to [place], Etter Sjogren.  Joetta Manners, PharmD Clinical Pharmacist [place] [phone]

## 2018-05-25 ENCOUNTER — Other Ambulatory Visit: Payer: Self-pay

## 2018-05-25 NOTE — Patient Outreach (Signed)
[facility]) Care Management  05/25/2018  Bryan [name] 07-16-1956 [id number]  Incoming call received from Bryan [name].  HIPAA identifiers verified.   [name] states that he received the patient assistance application today.  He had a couple of questions about what he needed to send back with the form.  He states that he will get it back in the mail to [place] tomorrow.   Plan: Route note to [place], Etter Sjogren.  Joetta Manners, PharmD Clinical Pharmacist [place] [phone]

## 2018-05-30 ENCOUNTER — Other Ambulatory Visit: Payer: Self-pay | Admitting: Pharmacy Technician

## 2018-05-30 NOTE — Patient Outreach (Signed)
[name]  05/30/2018  Bryan [name] [birth year] [id number]   Received patient portion of application for Linzess. Faxed completed application along with required documents into [employer].  Will follow up with company in 21-28 business days.  [name], [place] Management [phone]

## 2018-06-01 ENCOUNTER — Ambulatory Visit (INDEPENDENT_AMBULATORY_CARE_PROVIDER_SITE_OTHER): Payer: Medicare HMO | Admitting: Urology

## 2018-06-01 DIAGNOSIS — N3941 Urge incontinence: Secondary | ICD-10-CM

## 2018-06-01 DIAGNOSIS — N50812 Left testicular pain: Secondary | ICD-10-CM | POA: Diagnosis not present

## 2018-06-01 DIAGNOSIS — N401 Enlarged prostate with lower urinary tract symptoms: Secondary | ICD-10-CM

## 2018-06-01 DIAGNOSIS — R351 Nocturia: Secondary | ICD-10-CM

## 2018-06-04 ENCOUNTER — Encounter (HOSPITAL_COMMUNITY): Payer: Self-pay | Admitting: Emergency Medicine

## 2018-06-04 ENCOUNTER — Observation Stay (HOSPITAL_COMMUNITY)
Admission: EM | Admit: 2018-06-04 | Discharge: 2018-06-05 | Disposition: A | Discharge status: Home / Self Care | Payer: Medicare HMO | Attending: Internal Medicine | Admitting: Internal Medicine

## 2018-06-04 ENCOUNTER — Emergency Department (HOSPITAL_COMMUNITY): Payer: Medicare HMO

## 2018-06-04 ENCOUNTER — Other Ambulatory Visit: Payer: Self-pay

## 2018-06-04 DIAGNOSIS — J209 Acute bronchitis, unspecified: Secondary | ICD-10-CM

## 2018-06-04 DIAGNOSIS — Z87891 Personal history of nicotine dependence: Secondary | ICD-10-CM | POA: Insufficient documentation

## 2018-06-04 DIAGNOSIS — R0602 Shortness of breath: Secondary | ICD-10-CM | POA: Diagnosis present

## 2018-06-04 DIAGNOSIS — E785 Hyperlipidemia, unspecified: Secondary | ICD-10-CM | POA: Insufficient documentation

## 2018-06-04 DIAGNOSIS — J189 Pneumonia, unspecified organism: Secondary | ICD-10-CM | POA: Insufficient documentation

## 2018-06-04 DIAGNOSIS — R0902 Hypoxemia: Secondary | ICD-10-CM | POA: Diagnosis not present

## 2018-06-04 DIAGNOSIS — I129 Hypertensive chronic kidney disease with stage 1 through stage 4 chronic kidney disease, or unspecified chronic kidney disease: Secondary | ICD-10-CM | POA: Insufficient documentation

## 2018-06-04 DIAGNOSIS — E1169 Type 2 diabetes mellitus with other specified complication: Secondary | ICD-10-CM | POA: Diagnosis not present

## 2018-06-04 DIAGNOSIS — J9601 Acute respiratory failure with hypoxia: Secondary | ICD-10-CM | POA: Diagnosis present

## 2018-06-04 DIAGNOSIS — J441 Chronic obstructive pulmonary disease with (acute) exacerbation: Secondary | ICD-10-CM | POA: Insufficient documentation

## 2018-06-04 DIAGNOSIS — R079 Chest pain, unspecified: Secondary | ICD-10-CM | POA: Diagnosis not present

## 2018-06-04 DIAGNOSIS — I10 Essential (primary) hypertension: Secondary | ICD-10-CM | POA: Diagnosis not present

## 2018-06-04 DIAGNOSIS — J449 Chronic obstructive pulmonary disease, unspecified: Secondary | ICD-10-CM | POA: Diagnosis not present

## 2018-06-04 DIAGNOSIS — Z8673 Personal history of transient ischemic attack (TIA), and cerebral infarction without residual deficits: Secondary | ICD-10-CM | POA: Diagnosis not present

## 2018-06-04 DIAGNOSIS — J9621 Acute and chronic respiratory failure with hypoxia: Secondary | ICD-10-CM | POA: Diagnosis not present

## 2018-06-04 DIAGNOSIS — J9611 Chronic respiratory failure with hypoxia: Secondary | ICD-10-CM | POA: Insufficient documentation

## 2018-06-04 DIAGNOSIS — N183 Chronic kidney disease, stage 3 unspecified: Secondary | ICD-10-CM | POA: Diagnosis present

## 2018-06-04 DIAGNOSIS — J440 Chronic obstructive pulmonary disease with acute lower respiratory infection: Secondary | ICD-10-CM | POA: Diagnosis not present

## 2018-06-04 DIAGNOSIS — E119 Type 2 diabetes mellitus without complications: Secondary | ICD-10-CM | POA: Insufficient documentation

## 2018-06-04 DIAGNOSIS — E1142 Type 2 diabetes mellitus with diabetic polyneuropathy: Secondary | ICD-10-CM | POA: Diagnosis not present

## 2018-06-04 LAB — URINALYSIS, ROUTINE W REFLEX MICROSCOPIC
BILIRUBIN URINE: NEGATIVE
Glucose, UA: NEGATIVE mg/dL
Hgb urine dipstick: NEGATIVE
KETONES UR: NEGATIVE mg/dL
Leukocytes, UA: NEGATIVE
Nitrite: NEGATIVE
PROTEIN: NEGATIVE mg/dL
Specific Gravity, Urine: 1.012 (ref 1.005–1.030)
pH: 6 (ref 5.0–8.0)

## 2018-06-04 LAB — BASIC METABOLIC PANEL
Anion gap: 8 (ref 5–15)
BUN: 16 mg/dL (ref 8–23)
CHLORIDE: 105 mmol/L (ref 98–111)
CO2: 28 mmol/L (ref 22–32)
CREATININE: 1.45 mg/dL — AB (ref 0.61–1.24)
Calcium: 9 mg/dL (ref 8.9–10.3)
GFR calc Af Amer: 58 mL/min — ABNORMAL LOW (ref >60)
GFR calc non Af Amer: 50 mL/min — ABNORMAL LOW (ref >60)
Glucose, Bld: 91 mg/dL (ref 70–99)
Potassium: 4.2 mmol/L (ref 3.5–5.1)
Sodium: 141 mmol/L (ref 135–145)

## 2018-06-04 LAB — CBC WITH DIFFERENTIAL/PLATELET
BASOS PCT: 0 %
Basophils Absolute: 0 10*3/uL (ref 0.0–0.1)
EOS ABS: 0.1 10*3/uL (ref 0.0–0.7)
EOS PCT: 1 %
HCT: 40.8 % (ref 39.0–52.0)
Hemoglobin: 13.1 g/dL (ref 13.0–17.0)
LYMPHS ABS: 2.1 10*3/uL (ref 0.7–4.0)
Lymphocytes Relative: 18 %
MCH: 28.2 pg (ref 26.0–34.0)
MCHC: 32.1 g/dL (ref 30.0–36.0)
MCV: 87.7 fL (ref 78.0–100.0)
Monocytes Absolute: 1.5 10*3/uL — ABNORMAL HIGH (ref 0.1–1.0)
Monocytes Relative: 13 %
Neutro Abs: 8 10*3/uL — ABNORMAL HIGH (ref 1.7–7.7)
Neutrophils Relative %: 68 %
PLATELETS: 203 10*3/uL (ref 150–400)
RBC: 4.65 MIL/uL (ref 4.22–5.81)
RDW: 14.8 % (ref 11.5–15.5)
WBC: 11.7 10*3/uL — ABNORMAL HIGH (ref 4.0–10.5)

## 2018-06-04 LAB — GLUCOSE, CAPILLARY
Glucose-Capillary: 214 mg/dL — ABNORMAL HIGH (ref 70–99)
Glucose-Capillary: 197 mg/dL — ABNORMAL HIGH (ref 70–99)

## 2018-06-04 LAB — TROPONIN I: Troponin I: <0.03 ng/mL (ref <0.03)

## 2018-06-04 MED ORDER — VANCOMYCIN HCL IN DEXTROSE 1-5 GM/200ML-% IV SOLN
1000.0000 mg | Freq: Once | INTRAVENOUS | Status: AC
  Administered 2018-06-04: 1000 mg via INTRAVENOUS
  Filled 2018-06-04: qty 200

## 2018-06-04 MED ORDER — ALBUTEROL SULFATE (2.5 MG/3ML) 0.083% IN NEBU
2.5000 mg | INHALATION_SOLUTION | RESPIRATORY_TRACT | Status: DC | PRN
  Filled 2018-06-04: qty 3

## 2018-06-04 MED ORDER — VANCOMYCIN HCL IN DEXTROSE 750-5 MG/150ML-% IV SOLN
750.0000 mg | INTRAVENOUS | Status: DC
  Filled 2018-06-04: qty 150

## 2018-06-04 MED ORDER — IPRATROPIUM BROMIDE 0.02 % IN SOLN
0.5000 mg | Freq: Once | RESPIRATORY_TRACT | Status: AC
  Administered 2018-06-04: 0.5 mg via RESPIRATORY_TRACT
  Filled 2018-06-04: qty 2.5

## 2018-06-04 MED ORDER — PANTOPRAZOLE SODIUM 40 MG PO TBEC
40.0000 mg | DELAYED_RELEASE_TABLET | Freq: Two times a day (BID) | ORAL | Status: DC
  Administered 2018-06-04 – 2018-06-05 (×2): 40 mg via ORAL
  Filled 2018-06-04 (×2): qty 1

## 2018-06-04 MED ORDER — INSULIN ASPART 100 UNIT/ML ~~LOC~~ SOLN: 0.0000 [IU] | Freq: Every day | SUBCUTANEOUS | Status: DC

## 2018-06-04 MED ORDER — PIPERACILLIN-TAZOBACTAM 3.375 G IVPB 30 MIN
3.3750 g | Freq: Once | INTRAVENOUS | Status: AC
  Administered 2018-06-04: 3.375 g via INTRAVENOUS
  Filled 2018-06-04: qty 50

## 2018-06-04 MED ORDER — ENOXAPARIN SODIUM 40 MG/0.4ML ~~LOC~~ SOLN
40.0000 mg | SUBCUTANEOUS | Status: DC
  Administered 2018-06-04: 40 mg via SUBCUTANEOUS
  Filled 2018-06-04: qty 0.4

## 2018-06-04 MED ORDER — GABAPENTIN 300 MG PO CAPS
300.0000 mg | ORAL_CAPSULE | Freq: Three times a day (TID) | ORAL | Status: DC
  Administered 2018-06-04 – 2018-06-05 (×2): 300 mg via ORAL
  Filled 2018-06-04 (×3): qty 1

## 2018-06-04 MED ORDER — FLUTICASONE-UMECLIDIN-VILANT 100-62.5-25 MCG/INH IN AEPB
1.0000 | INHALATION_SPRAY | Freq: Every day | RESPIRATORY_TRACT | Status: DC
  Filled 2018-06-04 (×2): qty 1

## 2018-06-04 MED ORDER — SODIUM CHLORIDE 0.9 % IV SOLN
INTRAVENOUS | Status: DC | PRN
  Administered 2018-06-04: 250 mL via INTRAVENOUS

## 2018-06-04 MED ORDER — SODIUM CHLORIDE 0.9 % IV SOLN
2.0000 g | Freq: Two times a day (BID) | INTRAVENOUS | Status: DC
  Administered 2018-06-04: 2 g via INTRAVENOUS
  Filled 2018-06-04 (×6): qty 2

## 2018-06-04 MED ORDER — AMLODIPINE BESYLATE 5 MG PO TABS
5.0000 mg | ORAL_TABLET | Freq: Every day | ORAL | Status: DC
  Administered 2018-06-05: 5 mg via ORAL
  Filled 2018-06-04: qty 1

## 2018-06-04 MED ORDER — METHYLPREDNISOLONE SODIUM SUCC 40 MG IJ SOLR
40.0000 mg | Freq: Two times a day (BID) | INTRAMUSCULAR | Status: DC
  Administered 2018-06-05: 40 mg via INTRAVENOUS
  Filled 2018-06-04: qty 1

## 2018-06-04 MED ORDER — METHYLPREDNISOLONE SODIUM SUCC 125 MG IJ SOLR
125.0000 mg | Freq: Once | INTRAMUSCULAR | Status: AC
Start: 2018-06-04 — End: 2018-06-04
  Administered 2018-06-04: 125 mg via INTRAVENOUS
  Filled 2018-06-04: qty 2

## 2018-06-04 MED ORDER — IOPAMIDOL (ISOVUE-370) INJECTION 76%
100.0000 mL | Freq: Once | INTRAVENOUS | Status: AC | PRN
  Administered 2018-06-04: 100 mL via INTRAVENOUS

## 2018-06-04 MED ORDER — TAMSULOSIN HCL 0.4 MG PO CAPS
0.4000 mg | ORAL_CAPSULE | Freq: Every day | ORAL | Status: DC
  Administered 2018-06-05: 0.4 mg via ORAL
  Filled 2018-06-04: qty 1

## 2018-06-04 MED ORDER — INSULIN ASPART 100 UNIT/ML ~~LOC~~ SOLN
0.0000 [IU] | Freq: Three times a day (TID) | SUBCUTANEOUS | Status: DC
  Administered 2018-06-04: 3 [IU] via SUBCUTANEOUS
  Administered 2018-06-05: 2 [IU] via SUBCUTANEOUS
  Administered 2018-06-05: 1 [IU] via SUBCUTANEOUS

## 2018-06-04 MED ORDER — ACETAMINOPHEN 500 MG PO TABS
1000.0000 mg | ORAL_TABLET | Freq: Once | ORAL | Status: AC
  Administered 2018-06-04: 1000 mg via ORAL
  Filled 2018-06-04: qty 2

## 2018-06-04 MED ORDER — ALBUTEROL SULFATE (2.5 MG/3ML) 0.083% IN NEBU
5.0000 mg | INHALATION_SOLUTION | Freq: Once | RESPIRATORY_TRACT | Status: AC
  Administered 2018-06-04: 5 mg via RESPIRATORY_TRACT
  Filled 2018-06-04: qty 6

## 2018-06-04 MED ORDER — LINACLOTIDE 72 MCG PO CAPS
72.0000 ug | ORAL_CAPSULE | Freq: Every day | ORAL | Status: DC
  Administered 2018-06-05: 72 ug via ORAL
  Filled 2018-06-04 (×2): qty 1

## 2018-06-04 MED ORDER — PREDNISONE 20 MG PO TABS: 40.0000 mg | ORAL_TABLET | Freq: Every day | ORAL | Status: DC

## 2018-06-04 NOTE — ED Notes (Signed)
ED Provider at bedside. 

## 2018-06-04 NOTE — Progress Notes (Signed)
Pharmacy Antibiotic Note  GUNNAR HEREFORD is a 62 y.o. male admitted on 06/04/2018 with pneumonia.  Pharmacy has been consulted for Vancomycin dosing.  Plan: Vancomycin 1000 mg IV x 1 dose Vancomycin 750 mg IV every 24 hours.  Goal trough 15-20 mcg/mL.  Zosyn 3.375g IV x 1 in ED. Follow-up admission orders for further antibiotics Monitor labs, c/s, and vanco trough as indicated  Height: _0  (160 cm) Weight: 124 lb (56.2 kg) IBW/kg (Calculated) : 56.9  Temp (24hrs), Avg:100.1 F (37.8 C), Min:100.1 F (37.8 C), Max:100.1 F (37.8 C)  Recent Labs  Lab 06/04/18 1203  WBC 11.7*  CREATININE 1.45*    Estimated Creatinine Clearance: 42 mL/min (A) (by C-G formula based on SCr of 1.45 mg/dL (H)).    No Known Allergies  Antimicrobials this admission: Vanco 9/23 >>  Zosyn 9/23 x 1 dose  Dose adjustments this admission: [place]  Microbiology results: 9/23 BCx: pending      Thank you for allowing pharmacy to be a part of this patient's care.  Ramond Craver 06/04/2018 4:00 PM

## 2018-06-04 NOTE — Progress Notes (Signed)
PHARMACY NOTE:  ANTIMICROBIAL RENAL DOSAGE ADJUSTMENT  Current antimicrobial regimen includes a mismatch between antimicrobial dosage and estimated renal function.  As per policy approved by the Pharmacy & Therapeutics and Medical Executive Committees, the antimicrobial dosage will be adjusted accordingly.  Current antimicrobial dosage:  Cefepime 1 gram IV q8h.   Indication: HCAP  Renal Function:  Estimated Creatinine Clearance: 37.9 mL/min (A) (by C-G formula based on SCr of 1.45 mg/dL (H)). _0      On intermittent HD, scheduled: _1      On CRRT    Antimicrobial dosage has been changed to:  Cefepime 2 gram IV q12h based on recommended dose of 2 gram IV q8h  Additional comments:   Thank you for allowing pharmacy to be a part of this patient's care.  Vernie Ammons, [facility] 06/04/2018 6:02 PM

## 2018-06-04 NOTE — ED Provider Notes (Signed)
[facility] EMERGENCY DEPARTMENT Provider Note   CSN: [id number] Arrival date & time: 06/04/18  1053     History   Chief Complaint Chief Complaint  Patient presents with  . Shortness of Breath    HPI Bryan [name] is a 62 y.o. male.  Patient presents with shortness of breath since this morning.  Patient COPD history not on home oxygen and felt worsening shortness of breath and chest pain/pressure.  No radiation.  Patient sent from primary doctor.  Patient denies blood clot history, no active cancer treatment, no leg swelling.  Patient does have asthma history.  Patient does have pain with a deep breath.     Past Medical History:  Diagnosis Date  . Acid reflux   . Arthritis   . Asthma   . Chronic pain    with leg and back pain (disc problem)  . COPD (chronic obstructive pulmonary disease) ([place])   . Diabetes mellitus without complication ([place])   . Headache    HX OF  . History of upper GI x-ray series    to follow showed large duodenal ulcer H pylori serologies were negative  . Hypertension   . Pneumonia    07/17/17  . Pre-diabetes   . Stroke [facility])    TIA MINI STROKE  . Tubular adenoma     Patient Active Problem List   Diagnosis Date Noted  . Chronic respiratory failure with hypoxia ([place]) 06/04/2018  . Acute respiratory failure with hypoxia ([place]) 04/14/2018  . Pulmonary nodule 04/14/2018  . CKD (chronic kidney disease), stage III ([place]) 04/14/2018  . Type 2 diabetes mellitus with hyperlipidemia ([place]) 03/12/2018  . BPH with obstruction/lower urinary tract symptoms 08/17/2017  . Left sided numbness 07/22/2017  . TIA (transient ischemic attack) 07/22/2017  . Pneumonia 07/14/2017  . COPD with acute exacerbation ([place]) 07/14/2017  . Constipation 07/10/2017  . Hx of adenomatous colonic polyps 05/01/2017  . Hypertension   . Chronic pain   . Heme positive stool 10/20/2014  . Epigastric pain 06/19/2014  . Dysphagia 06/19/2014  . AP (abdominal pain) 11/13/2013  . Early  satiety 11/13/2013  . COPD, severe ([place]) 02/22/2013  . Low back pain 11/19/2012  . COLITIS 05/12/2009  . ABDOMINAL PAIN, LEFT LOWER QUADRANT 05/12/2009  . DUODENAL ULCER, HX OF 05/12/2009  . ALCOHOL USE 05/11/2009  . GERD 05/11/2009  . Diarrhea 05/11/2009  . SPONDYLOSIS, CERVICAL 07/03/2007  . NECK PAIN 07/03/2007    Past Surgical History:  Procedure Laterality Date  . BACK SURGERY  7/05; 5/09    Dr.Hirsch,3 lumbar  X3  . BACK SURGERY    . COLONOSCOPY  Feb 2012   Dr. Gala Romney: normal rectum, pedunculate polyp removed but not recovered  . COLONOSCOPY WITH ESOPHAGOGASTRODUODENOSCOPY (EGD) N/A 11/18/2013   Dr.Rourk- tcs= normal rectum, multipe polyps about the ileocecal valve and distal transverse colon o/w the remainder of the colonic mucosa appeared normal bx= tubular adenoma. EGD= normal esophagus, stomach with scattered erosions mottling, friablility, no ulcer or infiltrating process patent pylorus bx= chronic inflammation. next TCS 11/2016  . COLONOSCOPY WITH PROPOFOL N/A 05/25/2017   Dr. Gala Romney: sigmoid diverticulosis, one 4 mm hyerplastic rectal polyp, ascending colonic AVMs surveillance 2023  . ESOPHAGOGASTRODUODENOSCOPY  1/06   Dr. Volney American esophageal erosions,U-shaped stomach,marked erosions and edema of the bulb without discrete ulcer disease.   . ESOPHAGOGASTRODUODENOSCOPY (EGD) WITH PROPOFOL N/A 05/25/2017   Dr. Gala Romney: reflux esophagitis s/p empiric dilation, normal stomach and duodenum  . MALONEY DILATION N/A 05/25/2017  Procedure: MALONEY DILATION;  Surgeon: Daneil Dolin, MD;  Location: [facility] ENDO SUITE;  Service: Endoscopy;  Laterality: N/A;  . NECK SURGERY  10/2007   PLATE IN NECK  . POLYPECTOMY  05/25/2017   Procedure: POLYPECTOMY;  Surgeon: Daneil Dolin, MD;  Location: [facility] ENDO SUITE;  Service: Endoscopy;;  rectal  . TRANSURETHRAL RESECTION OF PROSTATE N/A 08/17/2017   Procedure: TRANSURETHRAL RESECTION OF THE PROSTATE (TURP);  Surgeon: Irine Seal, MD;  Location: [facility]  ORS;  Service: Urology;  Laterality: N/A;        Home Medications    Prior to Admission medications   Medication Sig Start Date End Date Taking? Authorizing Provider  albuterol (PROVENTIL HFA;VENTOLIN HFA) 108 (90 Base) MCG/ACT inhaler Inhale 1-2 puffs every 4 (four) hours as needed into the lungs for wheezing or shortness of breath. Do not use with nebulizer Patient taking differently: Inhale 1 puff into the lungs every 4 (four) hours as needed for wheezing or shortness of breath. Do not use with nebulizer 07/17/17  Yes [name], MD  albuterol (PROVENTIL) (2.5 MG/3ML) 0.083% nebulizer solution Take 3 mLs (2.5 mg total) every 4 (four) hours as needed by nebulization for wheezing or shortness of breath. 07/17/17  Yes [name], MD  amLODipine (NORVASC) 5 MG tablet Take 1 tablet (5 mg total) by mouth daily. 03/13/18 03/13/19 Yes [name], MD  atorvastatin (LIPITOR) 40 MG tablet Take 1 tablet (40 mg total) daily by mouth. 07/23/17 07/23/18 Yes [name], MD  Fluticasone-Umeclidin-Vilant (TRELEGY ELLIPTA) 100-62.5-25 MCG/INH AEPB Inhale 1 puff into the lungs daily.   Yes [provider]  gabapentin (NEURONTIN) 300 MG capsule Take 300 mg by mouth 3 (three) times daily. Pt. Says he is taking twice daily.   Yes [provider]  linaclotide Rolan Lipa) 72 MCG capsule Take 1 capsule (72 mcg total) by mouth daily before breakfast. 05/08/18  Yes [name], NP  pantoprazole (PROTONIX) 40 MG tablet Take 1 tablet (40 mg total) by mouth 2 (two) times daily before a meal. 03/19/18  Yes [name], NP  tamsulosin (FLOMAX) 0.4 MG CAPS capsule Take 1 capsule by mouth daily. 12/18/17  Yes [provider]  metFORMIN (GLUCOPHAGE) 500 MG tablet Take 1 tablet (500 mg total) daily with breakfast by mouth. Patient not taking: Reported on 06/04/2018 07/23/17 07/23/18  [name], MD  phenazopyridine (PYRIDIUM) 100 MG tablet Take 1 tablet (100 mg total) by  mouth 3 (three) times daily as needed for pain. Patient not taking: Reported on 05/10/2018 04/27/18   [name], PA-C  traMADol (ULTRAM) 50 MG tablet Take 1 tablet (50 mg total) by mouth every 6 (six) hours as needed. Patient not taking: Reported on 06/04/2018 04/27/18   [name], PA-C    Family History Family History  Problem Relation Age of Onset  . Cancer Father   . Asthma Mother   . Colon cancer Neg Hx     Social History Social History   Tobacco Use  . Smoking status: Former Smoker    Packs/day: 0.50    Years: 40.00    Pack years: 20.00    Types: Cigarettes    Last attempt to quit: 11/11/2014    Years since quitting: 3.5  . Smokeless tobacco: Never Used  Substance Use Topics  . Alcohol use: No    Alcohol/week: 0.0 standard drinks  . Drug use: Yes    Types: Marijuana    Comment:  'every once in a  while"      Allergies   Patient has no known allergies.   Review of Systems Review of Systems  Constitutional: Negative for chills and fever.  HENT: Negative for congestion.   Eyes: Negative for visual disturbance.  Respiratory: Positive for shortness of breath.   Cardiovascular: Positive for chest pain.  Gastrointestinal: Negative for abdominal pain and vomiting.  Genitourinary: Negative for dysuria and flank pain.  Musculoskeletal: Negative for back pain, neck pain and neck stiffness.  Skin: Negative for rash.  Neurological: Negative for light-headedness and headaches.     Physical Exam Updated Vital Signs BP 119/80   Pulse 77   Temp 100.1 F (37.8 C) (Oral)   Resp (!) 25   Ht _0  (1.6 m)   Wt 56.2 kg   SpO2 97%   BMI 21.97 kg/m   Physical Exam  Constitutional: He is oriented to person, place, and time. He appears well-developed and well-nourished.  HENT:  Head: Normocephalic and atraumatic.  Eyes: Conjunctivae are normal. Right eye exhibits no discharge. Left eye exhibits no discharge.  Neck: Normal range of motion. Neck supple. No tracheal  deviation present.  Cardiovascular: Normal rate and regular rhythm.  Pulmonary/Chest: Tachypnea noted. He has wheezes.  Abdominal: Soft. He exhibits no distension. There is no tenderness. There is no guarding.  Musculoskeletal: He exhibits no edema.  Neurological: He is alert and oriented to person, place, and time.  Skin: Skin is warm. No rash noted.  Psychiatric: He has a normal mood and affect.  Nursing note and vitals reviewed.    ED Treatments / Results  Labs (all labs ordered are listed, but only abnormal results are displayed) Labs Reviewed  BASIC METABOLIC PANEL - Abnormal; Notable for the following components:      Result Value   Creatinine, Ser 1.45 (*)    GFR calc non Af Amer 50 (*)    GFR calc Af Amer 58 (*)    All other components within normal limits  CBC WITH DIFFERENTIAL/PLATELET - Abnormal; Notable for the following components:   WBC 11.7 (*)    Neutro Abs 8.0 (*)    Monocytes Absolute 1.5 (*)    All other components within normal limits  CULTURE, BLOOD (ROUTINE X 2)  CULTURE, BLOOD (ROUTINE X 2)  URINALYSIS, ROUTINE W REFLEX MICROSCOPIC  TROPONIN I    EKG None  Radiology Ct Angio Chest Pe W And/or Wo Contrast  Result Date: 06/04/2018 CLINICAL DATA:  Chest pain EXAM: CT ANGIOGRAPHY CHEST WITH CONTRAST TECHNIQUE: Multidetector CT imaging of the chest was performed using the standard protocol during bolus administration of intravenous contrast. Multiplanar CT image reconstructions and MIPs were obtained to evaluate the vascular anatomy. CONTRAST:  100 mL ISOVUE-370 IOPAMIDOL (ISOVUE-370) INJECTION 76% COMPARISON:  Chest CT April 14, 2018 and chest radiograph June 04, 2018 FINDINGS: Cardiovascular: There is no demonstrable pulmonary embolus. There is no appreciable thoracic aortic aneurysm or dissection. Visualized great vessels appear unremarkable except for mild calcification in the proximal right subclavian artery. There are occasional foci of coronary  artery calcification. There is no pericardial effusion or pericardial thickening evident. Mediastinum/Nodes: Visualized thyroid appears normal. There is no appreciable thoracic adenopathy. There are subcentimeter axillary lymph nodes which do not meet size criteria for pathologic significance. No esophageal lesions are demonstrable. Lungs/Pleura: There is mild scarring in each apical region. There is underlying centrilobular emphysematous change. There is an area of consolidation in the apical segment of the left upper lobe. There are areas of  atelectasis in each lower lobe and in the inferior lingula. There is no evident pleural effusion or pleural thickening. Upper Abdomen: There is equivocal hepatic steatosis. Visualized upper abdominal structures otherwise appear unremarkable. Musculoskeletal: Postoperative changes noted in the lower cervical spine region. There are no blastic or lytic bone lesions. There is no evident chest wall lesion. Review of the MIP images confirms the above findings. IMPRESSION: 1. No demonstrable pulmonary embolus. No thoracic aortic aneurysm or dissection. 2. Infiltrate felt to represent pneumonia in the apical segment of the left upper lobe. Areas of atelectatic change in the inferior lingula and lung bases. Advise repeat study in 4-6 weeks after treatment for pneumonia to exclude possible underlying nodular lesion in the left apical region. 3.  No evident thoracic adenopathy. 4.  Equivocal hepatic steatosis. 5.  Occasional foci of coronary artery calcification. Electronically Signed   By: Lowella Grip III M.D.   On: 06/04/2018 13:47   Dg Chest Portable 1 View  Result Date: 06/04/2018 CLINICAL DATA:  Chest pain EXAM: PORTABLE CHEST 1 VIEW COMPARISON:  CT chest 04/14/2018 FINDINGS: The heart size and mediastinal contours are within normal limits. Both lungs are clear. The visualized skeletal structures are unremarkable. IMPRESSION: No active disease. Electronically Signed   By:  Kathreen Devoid   On: 06/04/2018 12:25    Procedures Procedures (including critical care time)  Medications Ordered in ED Medications  piperacillin-tazobactam (ZOSYN) IVPB 3.375 g (3.375 g Intravenous New Bag/Given 06/04/18 1522)  albuterol (PROVENTIL) (2.5 MG/3ML) 0.083% nebulizer solution 5 mg (has no administration in time range)  ipratropium (ATROVENT) nebulizer solution 0.5 mg (has no administration in time range)  albuterol (PROVENTIL) (2.5 MG/3ML) 0.083% nebulizer solution 5 mg (5 mg Nebulization Given 06/04/18 1207)  acetaminophen (TYLENOL) tablet 1,000 mg (1,000 mg Oral Given 06/04/18 1226)  iopamidol (ISOVUE-370) 76 % injection 100 mL (100 mLs Intravenous Contrast Given 06/04/18 1322)  methylPREDNISolone sodium succinate (SOLU-MEDROL) 125 mg/2 mL injection 125 mg (125 mg Intravenous Given 06/04/18 1503)     Initial Impression / Assessment and Plan / ED Course  I have reviewed the triage vital signs and the nursing notes.  Pertinent labs & imaging results that were available during my care of the patient were reviewed by me and considered in my medical decision making (see chart for details).    Patient presents with hypoxia, shortness of breath and chest pain pleuritic.  Discussed concern for possible infection shortness of breath/COPD history and borderline temperature however patient denies significant cough.  We discussed work-up for pulmonary embolism with pleuritic component. CT scan showed no pulmonary embolism however concern for pneumonia.  With fever, white blood cell elevation, cough and shortness of breath plan for IV antibiotics and admission.  Patient on 4 L nasal cannula will try to wean to 3 L. Patient felt improved with nebulizer, ordered albuterol for second dose. The patients results and plan were reviewed and discussed.   Any x-rays performed were independently reviewed by myself.   Differential diagnosis were considered with the presenting HPI.  Medications    piperacillin-tazobactam (ZOSYN) IVPB 3.375 g (3.375 g Intravenous New Bag/Given 06/04/18 1522)  albuterol (PROVENTIL) (2.5 MG/3ML) 0.083% nebulizer solution 5 mg (has no administration in time range)  ipratropium (ATROVENT) nebulizer solution 0.5 mg (has no administration in time range)  albuterol (PROVENTIL) (2.5 MG/3ML) 0.083% nebulizer solution 5 mg (5 mg Nebulization Given 06/04/18 1207)  acetaminophen (TYLENOL) tablet 1,000 mg (1,000 mg Oral Given 06/04/18 1226)  iopamidol (ISOVUE-370) 76 % injection  100 mL (100 mLs Intravenous Contrast Given 06/04/18 1322)  methylPREDNISolone sodium succinate (SOLU-MEDROL) 125 mg/2 mL injection 125 mg (125 mg Intravenous Given 06/04/18 1503)    Vitals:   06/04/18 1330 06/04/18 1400 06/04/18 1426 06/04/18 1430  BP: 131/76 126/69 126/69 119/80  Pulse: 92 91 90 77  Resp:   20 (!) 25  Temp:      TempSrc:      SpO2: 95% 97% 97% 97%  Weight:      Height:        Final diagnoses:  Acute bronchitis with chronic obstructive pulmonary disease (COPD) (HCC)  HCAP (healthcare-associated pneumonia)  Hypoxia    Admission/ observation were discussed with the admitting physician, patient and/or family and they are comfortable with the plan.     Final Clinical Impressions(s) / ED Diagnoses   Final diagnoses:  Acute bronchitis with chronic obstructive pulmonary disease (COPD) ([place])  HCAP (healthcare-associated pneumonia)  Hypoxia    ED Discharge Orders    None       Elnora Morrison, MD 06/04/18 1525

## 2018-06-04 NOTE — H&P (Signed)
History and Physical    Bryan [name]:[id number] DOB: [birth year] DOA: 06/04/2018  PCP: Rosita Fire, MD  Patient coming from: PCP office  I have personally briefly reviewed patient's old medical records in [place]  Chief Complaint: Chest tightness  HPI: Bryan [name] is a 62 y.o. male with medical history significant of COPD, type 2 diabetes ,hypertension presents with chest tightness.  This morning patient woke up and did not feel quite like himself.  He describes some substernal chest tightness.  The pain was pleuritic in nature.  He denies any cough.  He denies any fever at home.  Denies any travel or sick contacts.  Patient was at  his primary care provider today  for a regular checkup and they sent him over to the ED.  Patient had a CT of his chest that was negative for PE but did show a left upper lobe pneumonia.  Patient was last admitted to the hospital in August of this year for COPD exacerbation.  Patient quit smoking many years ago.  He has been compliant with his home medications except he stopped the statin and metformin as he stated it made him feel bad.  Patient blood sugars at home he states was between 90-100.   ED Course: CT of the chest showed left upper lobe pneumonia.  Patient was given IV Zosyn nebulizers and Tylenol.  Low-grade temperature 100.1 in the ED.  Review of Systems: Denies cough obvious sick contacts positive for low-grade fever here. All others reviewed with patient  and are  negative unless otherwise stated   Past Medical History:  Diagnosis Date  . Acid reflux   . Arthritis   . Asthma   . Chronic pain    with leg and back pain (disc problem)  . COPD (chronic obstructive pulmonary disease) ([place])   . Diabetes mellitus without complication ([place])   . Headache    HX OF  . History of upper GI x-ray series    to follow showed large duodenal ulcer H pylori serologies were negative  . Hypertension   . Pneumonia    07/17/17  . Pre-diabetes   .  Stroke [facility])    TIA MINI STROKE  . Tubular adenoma     Past Surgical History:  Procedure Laterality Date  . BACK SURGERY  7/05; 5/09    Dr.Hirsch,3 lumbar  X3  . BACK SURGERY    . COLONOSCOPY  Feb 2012   Dr. Gala Romney: normal rectum, pedunculate polyp removed but not recovered  . COLONOSCOPY WITH ESOPHAGOGASTRODUODENOSCOPY (EGD) N/A 11/18/2013   Dr.Rourk- tcs= normal rectum, multipe polyps about the ileocecal valve and distal transverse colon o/w the remainder of the colonic mucosa appeared normal bx= tubular adenoma. EGD= normal esophagus, stomach with scattered erosions mottling, friablility, no ulcer or infiltrating process patent pylorus bx= chronic inflammation. next TCS 11/2016  . COLONOSCOPY WITH PROPOFOL N/A 05/25/2017   Dr. Gala Romney: sigmoid diverticulosis, one 4 mm hyerplastic rectal polyp, ascending colonic AVMs surveillance 2023  . ESOPHAGOGASTRODUODENOSCOPY  1/06   Dr. Volney American esophageal erosions,U-shaped stomach,marked erosions and edema of the bulb without discrete ulcer disease.   . ESOPHAGOGASTRODUODENOSCOPY (EGD) WITH PROPOFOL N/A 05/25/2017   Dr. Gala Romney: reflux esophagitis s/p empiric dilation, normal stomach and duodenum  . MALONEY DILATION N/A 05/25/2017   Procedure: Venia Minks DILATION;  Surgeon: Daneil Dolin, MD;  Location: [facility]ITE;  Service: Endoscopy;  Laterality: N/A;  . NECK SURGERY  10/2007   PLATE IN NECK  .  POLYPECTOMY  05/25/2017   Procedure: POLYPECTOMY;  Surgeon: Daneil Dolin, MD;  Location: [facility];  Service: Endoscopy;;  rectal  . TRANSURETHRAL RESECTION OF PROSTATE N/A 08/17/2017   Procedure: TRANSURETHRAL RESECTION OF THE PROSTATE (TURP);  Surgeon: Irine Seal, MD;  Location: [facility] ORS;  Service: Urology;  Laterality: N/A;     reports that he quit smoking about 10 years ago. His smoking use included cigarettes. . He has never used smokeless tobacco. He reports that he has current or past drug history. Drug: Marijuana. He reports that he does not  drink alcohol.  No Known Allergies  Family History  Problem Relation Age of Onset  . Cancer Father   . Asthma, diabetes Mother   . Colon cancer Neg Hx     Prior to Admission medications   Medication Sig Start Date End Date Taking? Authorizing Provider  albuterol (PROVENTIL HFA;VENTOLIN HFA) 108 (90 Base) MCG/ACT inhaler Inhale 1-2 puffs every 4 (four) hours as needed into the lungs for wheezing or shortness of breath. Do not use with nebulizer Patient taking differently: Inhale 1 puff into the lungs every 4 (four) hours as needed for wheezing or shortness of breath. Do not use with nebulizer 07/17/17  Yes [name], MD  albuterol (PROVENTIL) (2.5 MG/3ML) 0.083% nebulizer solution Take 3 mLs (2.5 mg total) every 4 (four) hours as needed by nebulization for wheezing or shortness of breath. 07/17/17  Yes [name], MD  amLODipine (NORVASC) 5 MG tablet Take 1 tablet (5 mg total) by mouth daily. 03/13/18 03/13/19 Yes [name], MD  atorvastatin (LIPITOR) 40 MG tablet Take 1 tablet (40 mg total) daily by mouth. 07/23/17 07/23/18 Yes [name], MD  Fluticasone-Umeclidin-Vilant (TRELEGY ELLIPTA) 100-62.5-25 MCG/INH AEPB Inhale 1 puff into the lungs daily.   Yes [provider]  gabapentin (NEURONTIN) 300 MG capsule Take 300 mg by mouth 3 (three) times daily. Pt. Says he is taking twice daily.   Yes [provider]  linaclotide Rolan Lipa) 72 MCG capsule Take 1 capsule (72 mcg total) by mouth daily before breakfast. 05/08/18  Yes [name], NP  pantoprazole (PROTONIX) 40 MG tablet Take 1 tablet (40 mg total) by mouth 2 (two) times daily before a meal. 03/19/18  Yes [name], NP  tamsulosin (FLOMAX) 0.4 MG CAPS capsule Take 1 capsule by mouth daily. 12/18/17  Yes [provider]  metFORMIN (GLUCOPHAGE) 500 MG tablet Take 1 tablet (500 mg total) daily with breakfast by mouth. Patient not taking: Reported on 06/04/2018 07/23/17 07/23/18  [name], MD  phenazopyridine (PYRIDIUM) 100 MG tablet Take 1 tablet (100 mg total) by mouth 3 (three) times daily as needed for pain. Patient not taking: Reported on 05/10/2018 04/27/18   [name], PA-C  traMADol (ULTRAM) 50 MG tablet Take 1 tablet (50 mg total) by mouth every 6 (six) hours as needed. Patient not taking: Reported on 06/04/2018 04/27/18   [name], PA-C    Physical Exam: Vitals:   06/04/18 1521 06/04/18 1527 06/04/18 1530 06/04/18 1600  BP: 131/75  133/68 138/82  Pulse:   87 88  Resp:   (!) 23 12  Temp:      TempSrc:      SpO2:  90% 95% 93%  Weight:      Height:        Constitutional: NAD, calm, comfortable Vitals:   06/04/18 1521 06/04/18 1527 06/04/18 1530 06/04/18 1600  BP: 131/75  133/68 138/82  Pulse:   87 88  Resp:   (!) 23 12  Temp:      TempSrc:      SpO2:  90% 95% 93%  Weight:      Height:       Eyes: PERRL, lids and conjunctivae normal ENMT: Mucous membranes are moist. Posterior pharynx clear of any exudate or lesions.  Neck: normal, supple, no masses,  Respiratory:  Normal respiratory effort. No accessory muscle use.  Left-sided rales Cardiovascular:s regular rate and rhythm, no murmurs / rubs / gallops. No extremity edema. 2+ pedal pulses.   Abdomen: no tenderness, no masses palpated. No hepatosplenomegaly. Bowel sounds positive.  Musculoskeletal: no clubbing / cyanosis. No joint deformity upper and lower extremities.  no contractures. Normal muscle tone.  Skin: no rashes, lesions, ulcers. No induration Neurologic: CN 2-12 grossly intact.. Strength 5/5 in all 4.  Psychiatric: Normal judgment and insight. Alert and oriented x 3. Normal mood.   Labs on Admission: I have personally reviewed following labs and imaging studies  CBC: Recent Labs  Lab 06/04/18 1203  WBC 11.7*  NEUTROABS 8.0*  HGB 13.1  HCT 40.8  MCV 87.7  PLT 840   Basic Metabolic Panel: Recent Labs  Lab 06/04/18 1203  NA 141  K 4.2  CL 105  CO2 28  GLUCOSE  91  BUN 16  CREATININE 1.45*  CALCIUM 9.0   GFR: Estimated Creatinine Clearance: 42 mL/min (A) (by C-G formula based on SCr of 1.45 mg/dL (H)). Liver Function Tests: No results for input(s): AST, ALT, ALKPHOS, BILITOT, PROT, ALBUMIN in the last 168 hours. No results for input(s): LIPASE, AMYLASE in the last 168 hours. No results for input(s): AMMONIA in the last 168 hours. Coagulation Profile: No results for input(s): INR, PROTIME in the last 168 hours. Cardiac Enzymes: Recent Labs  Lab 06/04/18 1203  TROPONINI <0.03   BNP (last 3 results) No results for input(s): PROBNP in the last 8760 hours. HbA1C: No results for input(s): HGBA1C in the last 72 hours. CBG: No results for input(s): GLUCAP in the last 168 hours. Lipid Profile: No results for input(s): CHOL, HDL, LDLCALC, TRIG, CHOLHDL, LDLDIRECT in the last 72 hours. Thyroid Function Tests: No results for input(s): TSH, T4TOTAL, FREET4, T3FREE, THYROIDAB in the last 72 hours. Anemia Panel: No results for input(s): VITAMINB12, FOLATE, FERRITIN, TIBC, IRON, RETICCTPCT in the last 72 hours. Urine analysis:    Component Value Date/Time   COLORURINE YELLOW 06/04/2018 [place] 06/04/2018 1231   LABSPEC 1.012 06/04/2018 1231   PHURINE 6.0 06/04/2018 1231   GLUCOSEU NEGATIVE 06/04/2018 1231   HGBUR NEGATIVE 06/04/2018 1231   BILIRUBINUR NEGATIVE 06/04/2018 1231   KETONESUR NEGATIVE 06/04/2018 1231   PROTEINUR NEGATIVE 06/04/2018 1231   UROBILINOGEN 0.2 07/08/2015 1314   NITRITE NEGATIVE 06/04/2018 1231   LEUKOCYTESUR NEGATIVE 06/04/2018 1231    Radiological Exams on Admission: Ct Angio Chest Pe W And/or Wo Contrast  Result Date: 06/04/2018 CLINICAL DATA:  Chest pain EXAM: CT ANGIOGRAPHY CHEST WITH CONTRAST TECHNIQUE: Multidetector CT imaging of the chest was performed using the standard protocol during bolus administration of intravenous contrast. Multiplanar CT image reconstructions and MIPs were obtained  to evaluate the vascular anatomy. CONTRAST:  100 mL ISOVUE-370 IOPAMIDOL (ISOVUE-370) INJECTION 76% COMPARISON:  Chest CT April 14, 2018 and chest radiograph June 04, 2018 FINDINGS: Cardiovascular: There is no demonstrable pulmonary embolus. There is no appreciable thoracic aortic aneurysm or dissection. Visualized great vessels appear unremarkable except for mild calcification  in the proximal right subclavian artery. There are occasional foci of coronary artery calcification. There is no pericardial effusion or pericardial thickening evident. Mediastinum/Nodes: Visualized thyroid appears normal. There is no appreciable thoracic adenopathy. There are subcentimeter axillary lymph nodes which do not meet size criteria for pathologic significance. No esophageal lesions are demonstrable. Lungs/Pleura: There is mild scarring in each apical region. There is underlying centrilobular emphysematous change. There is an area of consolidation in the apical segment of the left upper lobe. There are areas of atelectasis in each lower lobe and in the inferior lingula. There is no evident pleural effusion or pleural thickening. Upper Abdomen: There is equivocal hepatic steatosis. Visualized upper abdominal structures otherwise appear unremarkable. Musculoskeletal: Postoperative changes noted in the lower cervical spine region. There are no blastic or lytic bone lesions. There is no evident chest wall lesion. Review of the MIP images confirms the above findings. IMPRESSION: 1. No demonstrable pulmonary embolus. No thoracic aortic aneurysm or dissection. 2. Infiltrate felt to represent pneumonia in the apical segment of the left upper lobe. Areas of atelectatic change in the inferior lingula and lung bases. Advise repeat study in 4-6 weeks after treatment for pneumonia to exclude possible underlying nodular lesion in the left apical region. 3.  No evident thoracic adenopathy. 4.  Equivocal hepatic steatosis. 5.  Occasional foci  of coronary artery calcification. Electronically Signed   By: Lowella Grip III M.D.   On: 06/04/2018 13:47   Dg Chest Portable 1 View  Result Date: 06/04/2018 CLINICAL DATA:  Chest pain EXAM: PORTABLE CHEST 1 VIEW COMPARISON:  CT chest 04/14/2018 FINDINGS: The heart size and mediastinal contours are within normal limits. Both lungs are clear. The visualized skeletal structures are unremarkable. IMPRESSION: No active disease. Electronically Signed   By: Kathreen Devoid   On: 06/04/2018 12:25      Assessment/Plan Principal Problem:   Acute respiratory failure with hypoxia (HCC) Active Problems:   Pneumonia HCAP   COPD with acute exacerbation ([place])   Hypertension   Type 2 diabetes mellitus with hyperlipidemia ([place])   CKD (chronic kidney disease), stage III (HCC)    -Bronchodilators, steroids, ween supplemental oxygen as needed to achieve O2 sats greater than 90%.  HIV antibody negative in August 2019  -antibiotics per protocol, supportive care, pulmonary toilet.  Follow cultures.   -Carb consistent diet, sliding scale insulin , hemoglobin A1c 0.20 March 2018. No longer taking Metformin -Chronic kidney disease at baseline -cont home medications for hypertension     DVT prophylaxis: lovenox Code Status: full Disposition Plan: home 1-2 days  Admission status:Obs med floor    Sakura Denis Johnson-Pitts MD [facility] Pager [phone]  If 7PM-7AM, please contact night-coverage [email or web address] Password [facility]  06/04/2018, 4:25 PM

## 2018-06-04 NOTE — Progress Notes (Signed)
SATURATION QUALIFICATIONS: (This note is used to comply with regulatory documentation for home oxygen)  Patient Saturations on Room Air at Rest = 87%  Patient Saturations on Room Air while Ambulating = 87%  Patient Saturations on 2 Liters of oxygen while Ambulating =92%  Please briefly explain why patient needs home oxygen:

## 2018-06-04 NOTE — ED Triage Notes (Signed)
Pt reports chest pain this morning, especially with taking a deep breath.  Sent from pcp.

## 2018-06-05 DIAGNOSIS — E1169 Type 2 diabetes mellitus with other specified complication: Secondary | ICD-10-CM | POA: Diagnosis not present

## 2018-06-05 DIAGNOSIS — J9601 Acute respiratory failure with hypoxia: Secondary | ICD-10-CM

## 2018-06-05 DIAGNOSIS — J441 Chronic obstructive pulmonary disease with (acute) exacerbation: Secondary | ICD-10-CM

## 2018-06-05 DIAGNOSIS — J189 Pneumonia, unspecified organism: Secondary | ICD-10-CM | POA: Diagnosis not present

## 2018-06-05 DIAGNOSIS — E785 Hyperlipidemia, unspecified: Secondary | ICD-10-CM | POA: Diagnosis not present

## 2018-06-05 DIAGNOSIS — I10 Essential (primary) hypertension: Secondary | ICD-10-CM

## 2018-06-05 DIAGNOSIS — N183 Chronic kidney disease, stage 3 (moderate): Secondary | ICD-10-CM | POA: Diagnosis not present

## 2018-06-05 LAB — GLUCOSE, CAPILLARY
GLUCOSE-CAPILLARY: 135 mg/dL — AB (ref 70–99)
GLUCOSE-CAPILLARY: 158 mg/dL — AB (ref 70–99)

## 2018-06-05 MED ORDER — PREDNISONE 20 MG PO TABS: ORAL_TABLET | ORAL | 0 refills | Status: DC

## 2018-06-05 MED ORDER — FLUTICASONE FUROATE-VILANTEROL 100-25 MCG/INH IN AEPB
1.0000 | INHALATION_SPRAY | Freq: Every day | RESPIRATORY_TRACT | Status: DC
  Administered 2018-06-05: 1 via RESPIRATORY_TRACT
  Filled 2018-06-05: qty 28

## 2018-06-05 MED ORDER — GABAPENTIN 300 MG PO CAPS: 300.0000 mg | ORAL_CAPSULE | Freq: Two times a day (BID) | ORAL | Status: DC

## 2018-06-05 MED ORDER — SODIUM CHLORIDE 0.9 % IV SOLN
2.0000 g | INTRAVENOUS | Status: DC
  Filled 2018-06-05: qty 2

## 2018-06-05 MED ORDER — UMECLIDINIUM BROMIDE 62.5 MCG/INH IN AEPB
1.0000 | INHALATION_SPRAY | Freq: Every day | RESPIRATORY_TRACT | Status: DC
  Administered 2018-06-05: 1 via RESPIRATORY_TRACT
  Filled 2018-06-05: qty 7

## 2018-06-05 MED ORDER — SALINE SPRAY 0.65 % NA SOLN
1.0000 | NASAL | Status: DC | PRN
  Filled 2018-06-05 (×2): qty 44

## 2018-06-05 MED ORDER — AMOXICILLIN-POT CLAVULANATE 875-125 MG PO TABS: 1.0000 | ORAL_TABLET | Freq: Two times a day (BID) | ORAL | 0 refills | Status: AC

## 2018-06-05 MED ORDER — GUAIFENESIN ER 600 MG PO TB12: 600.0000 mg | ORAL_TABLET | Freq: Two times a day (BID) | ORAL | 0 refills | Status: DC

## 2018-06-05 MED ORDER — GUAIFENESIN ER 600 MG PO TB12
600.0000 mg | ORAL_TABLET | Freq: Two times a day (BID) | ORAL | Status: DC
  Administered 2018-06-05: 600 mg via ORAL
  Filled 2018-06-05: qty 1

## 2018-06-05 MED ORDER — FLUTICASONE PROPIONATE 50 MCG/ACT NA SUSP: 1.0000 | Freq: Every day | NASAL | 1 refills | Status: DC

## 2018-06-05 MED ORDER — FLUTICASONE PROPIONATE 50 MCG/ACT NA SUSP
1.0000 | Freq: Every day | NASAL | Status: DC
  Administered 2018-06-05: 1 via NASAL
  Filled 2018-06-05 (×2): qty 16

## 2018-06-05 NOTE — Discharge Summary (Signed)
Physician Discharge Summary  Bryan [name] EPP:[id number] DOB: [birth year] DOA: 06/04/2018  PCP: Rosita Fire, MD  Admit date: 06/04/2018 Discharge date: 06/05/2018  Time spent: 35 minutes  Recommendations for Outpatient Follow-up:  1. Repeat basic metabolic panel to follow electrolytes and renal function 2. Close follow-up the patient's CBGs is/A1c and further adjust hypoglycemic regimen as needed. 3. Reassess need oxygen supplementation and if possible wean him off oxygen.   Discharge Diagnoses:  Principal Problem:   Acute respiratory failure with hypoxia (HCC) Active Problems:   Hypertension   Pneumonia   COPD with acute exacerbation (HCC)   Type 2 diabetes mellitus with hyperlipidemia ([place])   CKD (chronic kidney disease), stage III ([place])   HCAP (healthcare-associated pneumonia)   Discharge Condition: Stable and improved.  Patient discharged home with instruction to follow-up with PCP in 10 days and to follow-up with his pulmonologist as previously scheduled.  Diet recommendation: Heart healthy diet and modify carbohydrates.  Filed Weights   06/04/18 1119 06/04/18 1646  Weight: 56.2 kg 55.9 kg    History of present illness:  As per H&P written by Dr. Wynetta Emery on 06/04/2018. 62 y.o. male with medical history significant of COPD, type 2 diabetes ,hypertension presents with chest tightness.  This morning patient woke up and did not feel quite like himself.  He describes some substernal chest tightness.  The pain was pleuritic in nature.  He denies any cough.  He denies any fever at home.  Denies any travel or sick contacts.  Patient was at  his primary care provider today  for a regular checkup and they sent him over to the ED.  Patient had a CT of his chest that was negative for PE but did show a left upper lobe pneumonia.  Patient was last admitted to the hospital in August of this year for COPD exacerbation.  Patient quit smoking many years ago.  He has been compliant with his home  medications except he stopped the statin and metformin as he stated it made him feel bad.  Patient blood sugars at home he states was between 90-100.   ED Course: CT of the chest showed left upper lobe pneumonia.  Patient was given IV Zosyn nebulizers and Tylenol.  Low-grade temperature 100.1 in the ED.  Hospital Course:  1-acute respiratory failure with hypoxia: In the setting of COPD exacerbation and pneumonia. -Patient covered with broad-spectrum antibiotic for concern of H CAP. -After 24 hours of treatment patient breathing significantly improved and close to baseline, he was afebrile and in no respiratory distress. -Patient demonstrated to require oxygen supplementation especially on exertion. -He was treated with nebulizer therapy, antibiotics, flutter valve, Mucinex and steroids. -At discharge he will complete steroid tapering, PO antibiotics and continue flutter valve and mucinex.  2-essential hypertension -Stable and well controlled. -Continue current antihypertensive regimen. -Patient advised to follow heart healthy diet.  3-type 2 diabetes mellitus with nephropathy -Resume home hypoglycemic agents -Advised to follow modified carbohydrate diet -Advised to keep himself well hydrated.  4-chronic kidney disease is stage III -Stable and at baseline. -Patient advised to maintain adequate hydration and to take his medications up prescribed.  5-gastroesophageal reflux disease -Continue PPI.  6-BPH -No signs of urinary retention -Continue Flomax.  7-hyperlipidemia -Continue statins.  Procedures:  See below for x-ray reports.  Consultations:  None   Discharge Exam: Vitals:   06/05/18 0538 06/05/18 1012  BP: (!) 141/86   Pulse: 95   Resp:    Temp: 98.7 F (37.1 C)  SpO2: 90% 94%    General: Afebrile, denies chest pain, no nausea, no vomiting, no abdominal pain.  Patient able to speak in full sentences and reporting significant improvement in his breathing.  Has  still requiring oxygen supplementation especially on exertion but otherwise is feeling much better and asking to be discharged.  Patient reports feeling congested and having symptoms of stuffy nose (PND and rhinorrhea). Cardiovascular: S1 and S2, no rubs, no gallops, no murmurs, no JVD. Respiratory: Mild expiratory wheezing, no using accessory muscles, normal respiratory effort, no crackles, good oxygen saturation with 2 L supplementation through nasal cannula. Abdomen: Soft, nontender, nondistended, positive bowel sounds. Extremities: No edema, no cyanosis, no clubbing.  Discharge Instructions   Discharge Instructions    Diet - low sodium heart healthy   Complete by:  As directed    Discharge instructions   Complete by:  As directed    Keep yourself well-hydrated Follow heart healthy diet/modified carbohydrates. Arrange follow-up with PCP in 10 days. Follow-up with pulmonologist as previously scheduled. Take medications as prescribed. Use oxygen supplementation as instructed.     Allergies as of 06/05/2018   No Known Allergies     Medication List    STOP taking these medications   phenazopyridine 100 MG tablet Commonly known as:  PYRIDIUM     TAKE these medications   albuterol (2.5 MG/3ML) 0.083% nebulizer solution Commonly known as:  PROVENTIL Take 3 mLs (2.5 mg total) every 4 (four) hours as needed by nebulization for wheezing or shortness of breath. What changed:  Another medication with the same name was changed. Make sure you understand how and when to take each.   albuterol 108 (90 Base) MCG/ACT inhaler Commonly known as:  PROVENTIL HFA;VENTOLIN HFA Inhale 1-2 puffs every 4 (four) hours as needed into the lungs for wheezing or shortness of breath. Do not use with nebulizer What changed:  how much to take   amLODipine 5 MG tablet Commonly known as:  NORVASC Take 1 tablet (5 mg total) by mouth daily.   amoxicillin-clavulanate 875-125 MG tablet Commonly known as:   AUGMENTIN Take 1 tablet by mouth 2 (two) times daily for 14 days.   atorvastatin 40 MG tablet Commonly known as:  LIPITOR Take 1 tablet (40 mg total) daily by mouth.   fluticasone 50 MCG/ACT nasal spray Commonly known as:  FLONASE Place 1 spray into both nostrils daily. Start taking on:  06/06/2018   gabapentin 300 MG capsule Commonly known as:  NEURONTIN Take 1 capsule (300 mg total) by mouth 2 (two) times daily. Pt. Says he is taking twice daily. What changed:  when to take this   guaiFENesin 600 MG 12 hr tablet Commonly known as:  MUCINEX Take 1 tablet (600 mg total) by mouth 2 (two) times daily.   linaclotide 72 MCG capsule Commonly known as:  LINZESS Take 1 capsule (72 mcg total) by mouth daily before breakfast.   metFORMIN 500 MG tablet Commonly known as:  GLUCOPHAGE Take 1 tablet (500 mg total) daily with breakfast by mouth.   pantoprazole 40 MG tablet Commonly known as:  PROTONIX Take 1 tablet (40 mg total) by mouth 2 (two) times daily before a meal.   predniSONE 20 MG tablet Commonly known as:  DELTASONE Take 3 tabs daily X 1 day; then 2 tabs daily X 2 days; then 1 tablet by mouth daily X 2 days; then 1/2 tablet by mouth daily X 3 days and stop prednisone.   tamsulosin 0.4 MG Caps  capsule Commonly known as:  FLOMAX Take 1 capsule by mouth daily.   traMADol 50 MG tablet Commonly known as:  ULTRAM Take 1 tablet (50 mg total) by mouth every 6 (six) hours as needed.   TRELEGY ELLIPTA 100-62.5-25 MCG/INH Aepb Generic drug:  Fluticasone-Umeclidin-Vilant Inhale 1 puff into the lungs daily.            Durable Medical Equipment  (From admission, onward)         Start     Ordered   06/05/18 1301  For home use only DME oxygen  Once    Question Answer Comment  Mode or (Route) Nasal cannula   Liters per Minute 2   Frequency Continuous (stationary and portable oxygen unit needed)   Oxygen conserving device Yes   Oxygen delivery system Gas      06/05/18  1300         No Known Allergies Follow-up Information    Rosita Fire, MD. Schedule an appointment as soon as possible for a visit in 10 day(s).   Specialty:  Internal Medicine Contact information: [address] [place] [address] [phone]           The results of significant diagnostics from this hospitalization (including imaging, microbiology, ancillary and laboratory) are listed below for reference.    Significant Diagnostic Studies: Ct Angio Chest Pe W And/or Wo Contrast  Result Date: 06/04/2018 CLINICAL DATA:  Chest pain EXAM: CT ANGIOGRAPHY CHEST WITH CONTRAST TECHNIQUE: Multidetector CT imaging of the chest was performed using the standard protocol during bolus administration of intravenous contrast. Multiplanar CT image reconstructions and MIPs were obtained to evaluate the vascular anatomy. CONTRAST:  100 mL ISOVUE-370 IOPAMIDOL (ISOVUE-370) INJECTION 76% COMPARISON:  Chest CT April 14, 2018 and chest radiograph June 04, 2018 FINDINGS: Cardiovascular: There is no demonstrable pulmonary embolus. There is no appreciable thoracic aortic aneurysm or dissection. Visualized great vessels appear unremarkable except for mild calcification in the proximal right subclavian artery. There are occasional foci of coronary artery calcification. There is no pericardial effusion or pericardial thickening evident. Mediastinum/Nodes: Visualized thyroid appears normal. There is no appreciable thoracic adenopathy. There are subcentimeter axillary lymph nodes which do not meet size criteria for pathologic significance. No esophageal lesions are demonstrable. Lungs/Pleura: There is mild scarring in each apical region. There is underlying centrilobular emphysematous change. There is an area of consolidation in the apical segment of the left upper lobe. There are areas of atelectasis in each lower lobe and in the inferior lingula. There is no evident pleural effusion or pleural  thickening. Upper Abdomen: There is equivocal hepatic steatosis. Visualized upper abdominal structures otherwise appear unremarkable. Musculoskeletal: Postoperative changes noted in the lower cervical spine region. There are no blastic or lytic bone lesions. There is no evident chest wall lesion. Review of the MIP images confirms the above findings. IMPRESSION: 1. No demonstrable pulmonary embolus. No thoracic aortic aneurysm or dissection. 2. Infiltrate felt to represent pneumonia in the apical segment of the left upper lobe. Areas of atelectatic change in the inferior lingula and lung bases. Advise repeat study in 4-6 weeks after treatment for pneumonia to exclude possible underlying nodular lesion in the left apical region. 3.  No evident thoracic adenopathy. 4.  Equivocal hepatic steatosis. 5.  Occasional foci of coronary artery calcification. Electronically Signed   By: Lowella Grip III M.D.   On: 06/04/2018 13:47   Dg Chest Portable 1 View  Result Date: 06/04/2018 CLINICAL DATA:  Chest pain EXAM:  PORTABLE CHEST 1 VIEW COMPARISON:  CT chest 04/14/2018 FINDINGS: The heart size and mediastinal contours are within normal limits. Both lungs are clear. The visualized skeletal structures are unremarkable. IMPRESSION: No active disease. Electronically Signed   By: Kathreen Devoid   On: 06/04/2018 12:25   Ct Renal Stone Study  Result Date: 05/23/2018 CLINICAL DATA:  Left testicular pain.  Chronic renal disease. EXAM: CT ABDOMEN AND PELVIS WITHOUT CONTRAST TECHNIQUE: Multidetector CT imaging of the abdomen and pelvis was performed following the standard protocol without IV contrast. COMPARISON:  02/09/2018 CT abdomen/pelvis. FINDINGS: Lower chest: Centrilobular emphysema. Hepatobiliary: Normal liver size. No liver mass. Normal gallbladder with no radiopaque cholelithiasis. No biliary ductal dilatation. Pancreas: Normal, with no mass or duct dilation. Spleen: Normal size. No mass. Adrenals/Urinary Tract: No  discrete adrenal nodules. No hydronephrosis. No renal stones. No contour deforming renal mass. Normal caliber ureters, with no ureteral stones. Normal nondistended bladder. Stomach/Bowel: Normal non-distended stomach. Normal caliber small bowel with no small bowel wall thickening. Normal appendix. Normal large bowel with no diverticulosis, large bowel wall thickening or pericolonic fat stranding. Vascular/Lymphatic: Atherosclerotic nonaneurysmal abdominal aorta. No pathologically enlarged lymph nodes in the abdomen or pelvis. Reproductive: Normal size prostate. Other: No pneumoperitoneum, ascites or focal fluid collection. No evidence of ventral abdominal or inguinal hernia. Musculoskeletal: No aggressive appearing focal osseous lesions. Marked lumbar spondylosis. Status post left posterior spinal fusion at L3-4. IMPRESSION: 1. No acute abnormality.  No urolithiasis.  No hydronephrosis. 2. Aortic Atherosclerosis (ICD10-I70.0) and Emphysema (ICD10-J43.9). Electronically Signed   By: Ilona Sorrel M.D.   On: 05/23/2018 16:17    Microbiology: Recent Results (from the past 240 hour(s))  Blood culture (routine x 2)     Status: None (Preliminary result)   Collection Time: 06/04/18  3:19 PM  Result Value Ref Range Status   Specimen Description BLOOD BLOOD RIGHT WRIST  Final   Special Requests   Final    BOTTLES DRAWN AEROBIC AND ANAEROBIC Blood Culture adequate volume   Culture   Final    NO GROWTH < 24 HOURS Performed at [facility], [address] [place], [place] [address]    Report Status PENDING  Incomplete  Blood culture (routine x 2)     Status: None (Preliminary result)   Collection Time: 06/04/18  3:20 PM  Result Value Ref Range Status   Specimen Description BLOOD BLOOD LEFT HAND  Final   Special Requests   Final    BOTTLES DRAWN AEROBIC ONLY Blood Culture results may not be optimal due to an excessive volume of blood received in culture bottles   Culture   Final    NO GROWTH < 24  HOURS Performed at [facility], [address], [place], [place] [address]    Report Status PENDING  Incomplete     Labs: Basic Metabolic Panel: Recent Labs  Lab 06/04/18 1203  NA 141  K 4.2  CL 105  CO2 28  GLUCOSE 91  BUN 16  CREATININE 1.45*  CALCIUM 9.0   CBC: Recent Labs  Lab 06/04/18 1203  WBC 11.7*  NEUTROABS 8.0*  HGB 13.1  HCT 40.8  MCV 87.7  PLT 203   Cardiac Enzymes: Recent Labs  Lab 06/04/18 1203  TROPONINI <0.03   BNP: BNP (last 3 results) Recent Labs    07/14/17 1655 04/14/18 1345  BNP 101.0* 36.0    CBG: Recent Labs  Lab 06/04/18 1637 06/04/18 2103 06/05/18 0732 06/05/18 1128  GLUCAP 214* 197* 135* 158*  Signed:  Barton Dubois MD.  [facility] 06/05/2018, 1:28 PM

## 2018-06-05 NOTE — Care Management Note (Signed)
Case Management Note  Patient Details  Name: Bryan [name] MRN: [id number] Date of Birth: 12-25-1955  Subjective/Objective:     Admitted with COPD. Pt from home, lives with family. Pt has insurance with drug coverage. He is active with [facility] and reports they helping him with affording some of his medications. He has neb machine and inhalers he uses as instructed. He drives himself to appointments. He communicates no needs or concerns about DC needs. Acutely on oxygen.                  Action/Plan: Seen by PT with no recommendations, instructions given on purse lipped breathing. Patient reports nasal "stuffiness", bedside RN notifed and will assist with Saline spray.  CM will follow for potential oxygen needs.   Expected Discharge Date:   06/06/2018               Expected Discharge Plan:  Home/Self Care  In-House Referral:     Discharge planning Services  CM Consult  Post Acute Care Choice:    Choice offered to:  NA  DME Arranged:    DME Agency:     HH Arranged:    HH Agency:     Status of Service:  Completed, signed off  If discussed at [employer] of Stay Meetings, dates discussed:    Additional Comments:  Bryan [name], Bryan Reading, RN 06/05/2018, 11:03 AM

## 2018-06-05 NOTE — Evaluation (Signed)
Physical Therapy Evaluation Patient Details Name: Bryan [name] MRN: [id number] DOB: Oct 19, 1955 Today's Date: 06/05/2018   History of Present Illness  Bryan [name] is a 62 y.o. male with medical history significant of COPD, type 2 diabetes ,hypertension presents with chest tightness.  This morning patient woke up and did not feel quite like himself.  He describes some substernal chest tightness.  The pain was pleuritic in nature.  He denies any cough.  He denies any fever at home.  Denies any travel or sick contacts.  Patient was at  his primary care provider today  for a regular checkup and they sent him over to the ED.  Patient had a CT of his chest that was negative for PE but did show a left upper lobe pneumonia.  Patient was last admitted to the hospital in August of this year for COPD exacerbation.  Patient quit smoking many years ago.  He has been compliant with his home medications except he stopped the statin and metformin as he stated it made him feel bad.  Patient blood sugars at home he states was between 90-100.      Clinical Impression  Patient functioning at baseline for functional mobility and gait.  Patient on room air during gait training with O2 saturation dropping to 85%, after instruction in pursed lipped breathing, patient ambulated again with O2 saturation between 89-92% and able to maintain above 95% while resting in room on room air- RN notified.  Plan:  Patient discharged from physical therapy to care of nursing for ambulation daily as tolerated for length of stay.     Follow Up Recommendations No PT follow up    Equipment Recommendations  None recommended by PT    Recommendations for Other Services       Precautions / Restrictions Precautions Precautions: None Restrictions Weight Bearing Restrictions: No      Mobility  Bed Mobility Overal bed mobility: Independent                Transfers Overall transfer level: Independent                   Ambulation/Gait Ambulation/Gait assistance: Modified independent (Device/Increase time) Gait Distance (Feet): 200 Feet Assistive device: None Gait Pattern/deviations: WFL(Within Functional Limits) Gait velocity: normal   General Gait Details: demonstrates good return for ambulation on level, inclined, declined surfaces without loss of balance  Stairs            Wheelchair Mobility    Modified Rankin (Stroke Patients Only)       Balance Overall balance assessment: No apparent balance deficits (not formally assessed)                                           Pertinent Vitals/Pain Pain Assessment: No/denies pain    Home Living Family/patient expects to be discharged to:: Private residence Living Arrangements: Other relatives(son) Available Help at Discharge: Family Type of Home: House Home Access: Stairs to enter Entrance Stairs-Rails: None Entrance Stairs-Number of Steps: 1 Home Layout: One level Home Equipment: Cane - single point      Prior Function Level of Independence: Independent with assistive device(s)         Comments: community ambulator with [facility] PRN     Hand Dominance   Dominant Hand: Right    Extremity/Trunk Assessment   Upper Extremity Assessment Upper Extremity  Assessment: Overall WFL for tasks assessed    Lower Extremity Assessment Lower Extremity Assessment: Overall WFL for tasks assessed    Cervical / Trunk Assessment Cervical / Trunk Assessment: Normal  Communication   Communication: No difficulties  Cognition Arousal/Alertness: Awake/alert Behavior During Therapy: WFL for tasks assessed/performed Overall Cognitive Status: Within Functional Limits for tasks assessed                                        General Comments      Exercises     Assessment/Plan    PT Assessment Patent does not need any further PT services  PT Problem List         PT Treatment Interventions       PT Goals (Current goals can be found in the Care Plan section)  Acute Rehab PT Goals Patient Stated Goal: return home PT Goal Formulation: With patient Time For Goal Achievement: 06/05/18 Potential to Achieve Goals: Good    Frequency     Barriers to discharge        Co-evaluation               AM-PAC PT "6 Clicks" Daily Activity  Outcome Measure Difficulty turning over in bed (including adjusting bedclothes, sheets and blankets)?: None Difficulty moving from lying on back to sitting on the side of the bed? : None Difficulty sitting down on and standing up from a chair with arms (e.g., wheelchair, bedside commode, etc,.)?: None Help needed moving to and from a bed to chair (including a wheelchair)?: None Help needed walking in hospital room?: None Help needed climbing 3-5 steps with a railing? : None 6 Click Score: 24    End of Session   Activity Tolerance: Patient tolerated treatment well(Patient limited by O2 desaturation) Patient left: in bed;with call bell/phone within reach(seated at bedside) Nurse Communication: Mobility status PT Visit Diagnosis: Unsteadiness on feet (R26.81);Other abnormalities of gait and mobility (R26.89);Muscle weakness (generalized) (M62.81)    Time: 4967-5916 PT Time Calculation (min) (ACUTE ONLY): 32 min   Charges:   PT Evaluation $PT Eval Moderate Complexity: 1 Mod PT Treatments $Therapeutic Activity: 23-37 mins        9:35 AM, 06/05/18 Lonell Grandchild, MPT Physical Therapist with [facility] 336 [phone] office [phone] mobile phone

## 2018-06-05 NOTE — Progress Notes (Signed)
OT Cancellation Note  Patient Details Name: Bryan [name] MRN: [id number] DOB: 1956/07/18   Cancelled Treatment:    Reason Eval/Treat Not Completed: OT screened, no needs identified, will sign off. Pt is at baseline with ADLs. No further OT services required at this time.    Guadelupe Sabin, OTR/L  [phone] 06/05/2018, 8:19 AM

## 2018-06-05 NOTE — Progress Notes (Signed)
Patient IV removed, tolerated well. Patient given discharge directions at bedside.

## 2018-06-05 NOTE — Care Management Obs Status (Signed)
[facility] NOTIFICATION   Patient Details  Name: Bryan [name] MRN: [id number] Date of Birth: 1956-08-21   Medicare Observation Status Notification Given:  Yes    Amarii Amy, [name], RN 06/05/2018, 7:48 AM

## 2018-06-05 NOTE — Progress Notes (Addendum)
SATURATION QUALIFICATIONS: (This note is used to comply with regulatory documentation for home oxygen)  Patient Saturations on Room Air at Rest = 94%  Patient Saturations on Room Air while Ambulating = 96%  Patient walked 100 feet

## 2018-06-09 LAB — CULTURE, BLOOD (ROUTINE X 2)
Culture: NO GROWTH
Culture: NO GROWTH
Special Requests: ADEQUATE

## 2018-06-11 DIAGNOSIS — I10 Essential (primary) hypertension: Secondary | ICD-10-CM | POA: Diagnosis not present

## 2018-06-11 DIAGNOSIS — J9601 Acute respiratory failure with hypoxia: Secondary | ICD-10-CM | POA: Diagnosis not present

## 2018-06-11 DIAGNOSIS — J189 Pneumonia, unspecified organism: Secondary | ICD-10-CM | POA: Diagnosis not present

## 2018-06-11 DIAGNOSIS — J449 Chronic obstructive pulmonary disease, unspecified: Secondary | ICD-10-CM | POA: Diagnosis not present

## 2018-06-14 DIAGNOSIS — I10 Essential (primary) hypertension: Secondary | ICD-10-CM | POA: Diagnosis not present

## 2018-06-14 DIAGNOSIS — N401 Enlarged prostate with lower urinary tract symptoms: Secondary | ICD-10-CM | POA: Diagnosis not present

## 2018-06-14 DIAGNOSIS — J449 Chronic obstructive pulmonary disease, unspecified: Secondary | ICD-10-CM | POA: Diagnosis not present

## 2018-06-14 DIAGNOSIS — K210 Gastro-esophageal reflux disease with esophagitis: Secondary | ICD-10-CM | POA: Diagnosis not present

## 2018-06-15 ENCOUNTER — Other Ambulatory Visit: Payer: Self-pay | Admitting: Pharmacy Technician

## 2018-06-15 DIAGNOSIS — R0602 Shortness of breath: Secondary | ICD-10-CM | POA: Diagnosis not present

## 2018-06-15 DIAGNOSIS — J4520 Mild intermittent asthma, uncomplicated: Secondary | ICD-10-CM | POA: Diagnosis not present

## 2018-06-15 DIAGNOSIS — J449 Chronic obstructive pulmonary disease, unspecified: Secondary | ICD-10-CM | POA: Diagnosis not present

## 2018-06-15 DIAGNOSIS — J441 Chronic obstructive pulmonary disease with (acute) exacerbation: Secondary | ICD-10-CM | POA: Diagnosis not present

## 2018-06-15 NOTE — Patient Outreach (Signed)
[facility]) Care Management  06/15/2018  Bryan [name] [birth year] [id number]   Follow up call to [name] patient assistance to check status of patients application for [employer]. Informed that patient has been denied due to co-pay not meeting the requirements for the program.  Will route note to [name] for case closure.  [name], [place] Management [phone]

## 2018-06-18 ENCOUNTER — Other Ambulatory Visit: Payer: Self-pay

## 2018-06-18 DIAGNOSIS — R351 Nocturia: Secondary | ICD-10-CM | POA: Diagnosis not present

## 2018-06-18 DIAGNOSIS — N401 Enlarged prostate with lower urinary tract symptoms: Secondary | ICD-10-CM | POA: Diagnosis not present

## 2018-06-18 NOTE — Patient Outreach (Signed)
[facility] ([facility]) Care Management  06/18/2018  Bryan [name] 02/28/1956 [id number]  Reason for consult: medication assistance- [employer]  [facility] pharmacy case is being closed due to the following reasons:  Goals have been met.  He was evaluated for medication assistance, but denied by [employer] for not meeting their program requirements.  Patient has been provided [facility] CM contact information if assistance needed in the future.    Thank you for allowing [facility] pharmacy to be involved in this patient's care.    Jennifer Mendenhall, PharmD Clinical Pharmacist [facility] [phone]       

## 2018-06-26 ENCOUNTER — Emergency Department (HOSPITAL_COMMUNITY): Payer: Medicare HMO

## 2018-06-26 ENCOUNTER — Encounter (HOSPITAL_COMMUNITY): Payer: Self-pay

## 2018-06-26 ENCOUNTER — Inpatient Hospital Stay (HOSPITAL_COMMUNITY)
Admission: EM | Admit: 2018-06-26 | Discharge: 2018-06-29 | DRG: 191 | Disposition: A | Discharge status: Home / Self Care | Payer: Medicare HMO | Attending: Internal Medicine | Admitting: Internal Medicine

## 2018-06-26 ENCOUNTER — Other Ambulatory Visit: Payer: Self-pay

## 2018-06-26 DIAGNOSIS — Z79899 Other long term (current) drug therapy: Secondary | ICD-10-CM | POA: Diagnosis not present

## 2018-06-26 DIAGNOSIS — Z8701 Personal history of pneumonia (recurrent): Secondary | ICD-10-CM | POA: Diagnosis not present

## 2018-06-26 DIAGNOSIS — E785 Hyperlipidemia, unspecified: Secondary | ICD-10-CM | POA: Diagnosis present

## 2018-06-26 DIAGNOSIS — R531 Weakness: Secondary | ICD-10-CM | POA: Diagnosis not present

## 2018-06-26 DIAGNOSIS — J9611 Chronic respiratory failure with hypoxia: Secondary | ICD-10-CM | POA: Diagnosis present

## 2018-06-26 DIAGNOSIS — E1169 Type 2 diabetes mellitus with other specified complication: Secondary | ICD-10-CM | POA: Diagnosis present

## 2018-06-26 DIAGNOSIS — R0789 Other chest pain: Secondary | ICD-10-CM | POA: Diagnosis not present

## 2018-06-26 DIAGNOSIS — G8929 Other chronic pain: Secondary | ICD-10-CM | POA: Diagnosis present

## 2018-06-26 DIAGNOSIS — D631 Anemia in chronic kidney disease: Secondary | ICD-10-CM | POA: Diagnosis present

## 2018-06-26 DIAGNOSIS — I10 Essential (primary) hypertension: Secondary | ICD-10-CM | POA: Diagnosis not present

## 2018-06-26 DIAGNOSIS — E876 Hypokalemia: Secondary | ICD-10-CM | POA: Diagnosis present

## 2018-06-26 DIAGNOSIS — N183 Chronic kidney disease, stage 3 unspecified: Secondary | ICD-10-CM | POA: Diagnosis present

## 2018-06-26 DIAGNOSIS — J441 Chronic obstructive pulmonary disease with (acute) exacerbation: Secondary | ICD-10-CM | POA: Diagnosis present

## 2018-06-26 DIAGNOSIS — I129 Hypertensive chronic kidney disease with stage 1 through stage 4 chronic kidney disease, or unspecified chronic kidney disease: Secondary | ICD-10-CM | POA: Diagnosis present

## 2018-06-26 DIAGNOSIS — R0600 Dyspnea, unspecified: Secondary | ICD-10-CM | POA: Diagnosis not present

## 2018-06-26 DIAGNOSIS — R079 Chest pain, unspecified: Secondary | ICD-10-CM | POA: Diagnosis not present

## 2018-06-26 DIAGNOSIS — K219 Gastro-esophageal reflux disease without esophagitis: Secondary | ICD-10-CM | POA: Diagnosis present

## 2018-06-26 DIAGNOSIS — R05 Cough: Secondary | ICD-10-CM | POA: Diagnosis not present

## 2018-06-26 DIAGNOSIS — Z8673 Personal history of transient ischemic attack (TIA), and cerebral infarction without residual deficits: Secondary | ICD-10-CM | POA: Diagnosis not present

## 2018-06-26 DIAGNOSIS — Z7951 Long term (current) use of inhaled steroids: Secondary | ICD-10-CM | POA: Diagnosis not present

## 2018-06-26 DIAGNOSIS — Z87891 Personal history of nicotine dependence: Secondary | ICD-10-CM

## 2018-06-26 DIAGNOSIS — N400 Enlarged prostate without lower urinary tract symptoms: Secondary | ICD-10-CM | POA: Diagnosis present

## 2018-06-26 DIAGNOSIS — Z7984 Long term (current) use of oral hypoglycemic drugs: Secondary | ICD-10-CM | POA: Diagnosis not present

## 2018-06-26 DIAGNOSIS — K210 Gastro-esophageal reflux disease with esophagitis: Secondary | ICD-10-CM | POA: Diagnosis not present

## 2018-06-26 DIAGNOSIS — D649 Anemia, unspecified: Secondary | ICD-10-CM | POA: Diagnosis present

## 2018-06-26 DIAGNOSIS — D509 Iron deficiency anemia, unspecified: Secondary | ICD-10-CM | POA: Diagnosis present

## 2018-06-26 LAB — COMPREHENSIVE METABOLIC PANEL
ALBUMIN: 3.8 g/dL (ref 3.5–5.0)
ALK PHOS: 90 U/L (ref 38–126)
ALT: 11 U/L (ref 0–44)
AST: 18 U/L (ref 15–41)
Anion gap: 8 (ref 5–15)
BUN: 15 mg/dL (ref 8–23)
CALCIUM: 8.8 mg/dL — AB (ref 8.9–10.3)
CO2: 26 mmol/L (ref 22–32)
Chloride: 106 mmol/L (ref 98–111)
Creatinine, Ser: 1.54 mg/dL — ABNORMAL HIGH (ref 0.61–1.24)
GFR calc Af Amer: 54 mL/min — ABNORMAL LOW (ref >60)
GFR calc non Af Amer: 47 mL/min — ABNORMAL LOW (ref >60)
GLUCOSE: 127 mg/dL — AB (ref 70–99)
Potassium: 3.4 mmol/L — ABNORMAL LOW (ref 3.5–5.1)
Sodium: 140 mmol/L (ref 135–145)
Total Bilirubin: 0.5 mg/dL (ref 0.3–1.2)
Total Protein: 7.5 g/dL (ref 6.5–8.1)

## 2018-06-26 LAB — CBC WITH DIFFERENTIAL/PLATELET
ABS IMMATURE GRANULOCYTES: 0.01 10*3/uL (ref 0.00–0.07)
Basophils Absolute: 0.1 10*3/uL (ref 0.0–0.1)
Basophils Relative: 1 %
Eosinophils Absolute: 0.2 10*3/uL (ref 0.0–0.5)
Eosinophils Relative: 3 %
HCT: 41.8 % (ref 39.0–52.0)
HEMOGLOBIN: 12.8 g/dL — AB (ref 13.0–17.0)
IMMATURE GRANULOCYTES: 0 %
LYMPHS PCT: 34 %
Lymphs Abs: 2.4 10*3/uL (ref 0.7–4.0)
MCH: 26.4 pg (ref 26.0–34.0)
MCHC: 30.6 g/dL (ref 30.0–36.0)
MCV: 86.2 fL (ref 80.0–100.0)
MONO ABS: 0.8 10*3/uL (ref 0.1–1.0)
MONOS PCT: 12 %
NEUTROS ABS: 3.5 10*3/uL (ref 1.7–7.7)
NEUTROS PCT: 50 %
Platelets: 260 10*3/uL (ref 150–400)
RBC: 4.85 MIL/uL (ref 4.22–5.81)
RDW: 15.4 % (ref 11.5–15.5)
WBC: 7 10*3/uL (ref 4.0–10.5)
nRBC: 0 % (ref 0.0–0.2)

## 2018-06-26 LAB — BRAIN NATRIURETIC PEPTIDE: B Natriuretic Peptide: 66 pg/mL (ref 0.0–100.0)

## 2018-06-26 LAB — TROPONIN I: Troponin I: <0.03 ng/mL (ref <0.03)

## 2018-06-26 LAB — PROTIME-INR
INR: 0.98
PROTHROMBIN TIME: 12.9 s (ref 11.4–15.2)

## 2018-06-26 LAB — GLUCOSE, CAPILLARY: GLUCOSE-CAPILLARY: 117 mg/dL — AB (ref 70–99)

## 2018-06-26 LAB — PHOSPHORUS: PHOSPHORUS: 3.6 mg/dL (ref 2.5–4.6)

## 2018-06-26 LAB — MAGNESIUM: Magnesium: 1.4 mg/dL — ABNORMAL LOW (ref 1.7–2.4)

## 2018-06-26 MED ORDER — ALBUTEROL SULFATE (2.5 MG/3ML) 0.083% IN NEBU: 2.5000 mg | INHALATION_SOLUTION | Freq: Four times a day (QID) | RESPIRATORY_TRACT | Status: DC

## 2018-06-26 MED ORDER — METOPROLOL TARTRATE 5 MG/5ML IV SOLN
2.5000 mg | Freq: Once | INTRAVENOUS | Status: AC
  Administered 2018-06-26: 2.5 mg via INTRAVENOUS
  Filled 2018-06-26: qty 5

## 2018-06-26 MED ORDER — ACETAMINOPHEN 650 MG RE SUPP: 650.0000 mg | Freq: Four times a day (QID) | RECTAL | Status: DC | PRN

## 2018-06-26 MED ORDER — POTASSIUM CHLORIDE IN NACL 20-0.45 MEQ/L-% IV SOLN
INTRAVENOUS | Status: DC
  Administered 2018-06-27: 01:00:00 via INTRAVENOUS
  Filled 2018-06-26: qty 1000
  Filled 2018-06-26: qty 2000

## 2018-06-26 MED ORDER — ACETAMINOPHEN 325 MG PO TABS
650.0000 mg | ORAL_TABLET | Freq: Four times a day (QID) | ORAL | Status: DC | PRN
  Filled 2018-06-26: qty 2

## 2018-06-26 MED ORDER — PANTOPRAZOLE SODIUM 40 MG PO TBEC
40.0000 mg | DELAYED_RELEASE_TABLET | Freq: Two times a day (BID) | ORAL | Status: DC
  Administered 2018-06-27 – 2018-06-29 (×5): 40 mg via ORAL
  Filled 2018-06-26 (×5): qty 1

## 2018-06-26 MED ORDER — GUAIFENESIN ER 600 MG PO TB12
600.0000 mg | ORAL_TABLET | Freq: Two times a day (BID) | ORAL | Status: DC
  Administered 2018-06-26 – 2018-06-29 (×6): 600 mg via ORAL
  Filled 2018-06-26 (×6): qty 1

## 2018-06-26 MED ORDER — INSULIN ASPART 100 UNIT/ML ~~LOC~~ SOLN
0.0000 [IU] | Freq: Three times a day (TID) | SUBCUTANEOUS | Status: DC
  Administered 2018-06-27 – 2018-06-28 (×4): 2 [IU] via SUBCUTANEOUS
  Administered 2018-06-29: 3 [IU] via SUBCUTANEOUS

## 2018-06-26 MED ORDER — POTASSIUM CHLORIDE IN NACL 20-0.45 MEQ/L-% IV SOLN
INTRAVENOUS | Status: DC
  Filled 2018-06-26 (×2): qty 1000

## 2018-06-26 MED ORDER — FAMOTIDINE IN NACL 20-0.9 MG/50ML-% IV SOLN
20.0000 mg | Freq: Once | INTRAVENOUS | Status: AC
  Administered 2018-06-27: 20 mg via INTRAVENOUS
  Filled 2018-06-26: qty 50

## 2018-06-26 MED ORDER — SODIUM CHLORIDE 0.9 % IV SOLN
500.0000 mg | Freq: Once | INTRAVENOUS | Status: AC
  Administered 2018-06-26: 500 mg via INTRAVENOUS
  Filled 2018-06-26: qty 500

## 2018-06-26 MED ORDER — MAGNESIUM SULFATE 4 GM/100ML IV SOLN
4.0000 g | Freq: Once | INTRAVENOUS | Status: AC
  Administered 2018-06-26: 4 g via INTRAVENOUS
  Filled 2018-06-26: qty 100

## 2018-06-26 MED ORDER — HEPARIN SODIUM (PORCINE) 5000 UNIT/ML IJ SOLN
5000.0000 [IU] | Freq: Three times a day (TID) | INTRAMUSCULAR | Status: DC
  Administered 2018-06-26 – 2018-06-29 (×8): 5000 [IU] via SUBCUTANEOUS
  Filled 2018-06-26 (×8): qty 1

## 2018-06-26 MED ORDER — TAMSULOSIN HCL 0.4 MG PO CAPS
0.4000 mg | ORAL_CAPSULE | Freq: Every day | ORAL | Status: DC
  Administered 2018-06-27 – 2018-06-29 (×3): 0.4 mg via ORAL
  Filled 2018-06-26 (×3): qty 1

## 2018-06-26 MED ORDER — KETOROLAC TROMETHAMINE 30 MG/ML IJ SOLN
30.0000 mg | Freq: Once | INTRAMUSCULAR | Status: AC
  Administered 2018-06-26: 30 mg via INTRAVENOUS
  Filled 2018-06-26: qty 1

## 2018-06-26 MED ORDER — MAGNESIUM SULFATE IN D5W 1-5 GM/100ML-% IV SOLN
1.0000 g | INTRAVENOUS | Status: AC
  Administered 2018-06-26: 1 g via INTRAVENOUS
  Filled 2018-06-26: qty 100

## 2018-06-26 MED ORDER — METHYLPREDNISOLONE SODIUM SUCC 40 MG IJ SOLR
40.0000 mg | Freq: Four times a day (QID) | INTRAMUSCULAR | Status: AC
  Administered 2018-06-27 (×4): 40 mg via INTRAVENOUS
  Filled 2018-06-26 (×4): qty 1

## 2018-06-26 MED ORDER — AMLODIPINE BESYLATE 5 MG PO TABS
5.0000 mg | ORAL_TABLET | Freq: Every day | ORAL | Status: DC
  Administered 2018-06-27 – 2018-06-29 (×3): 5 mg via ORAL
  Filled 2018-06-26 (×3): qty 1

## 2018-06-26 MED ORDER — ASPIRIN 81 MG PO CHEW: 324.0000 mg | CHEWABLE_TABLET | Freq: Once | ORAL | Status: DC

## 2018-06-26 MED ORDER — SODIUM CHLORIDE 0.9 % IV SOLN
INTRAVENOUS | Status: DC | PRN
  Administered 2018-06-26: 500 mL via INTRAVENOUS

## 2018-06-26 MED ORDER — METHYLPREDNISOLONE SODIUM SUCC 125 MG IJ SOLR
125.0000 mg | Freq: Once | INTRAMUSCULAR | Status: AC
  Administered 2018-06-26: 125 mg via INTRAVENOUS
  Filled 2018-06-26: qty 2

## 2018-06-26 MED ORDER — POTASSIUM CHLORIDE CRYS ER 20 MEQ PO TBCR
40.0000 meq | EXTENDED_RELEASE_TABLET | Freq: Once | ORAL | Status: AC
  Administered 2018-06-26: 40 meq via ORAL
  Filled 2018-06-26: qty 2

## 2018-06-26 MED ORDER — LORAZEPAM 2 MG/ML IJ SOLN: 1.0000 mg | Freq: Once | INTRAMUSCULAR | Status: DC

## 2018-06-26 MED ORDER — ALBUTEROL SULFATE (2.5 MG/3ML) 0.083% IN NEBU: 2.5000 mg | INHALATION_SOLUTION | RESPIRATORY_TRACT | Status: DC | PRN

## 2018-06-26 MED ORDER — IPRATROPIUM-ALBUTEROL 0.5-2.5 (3) MG/3ML IN SOLN
3.0000 mL | Freq: Four times a day (QID) | RESPIRATORY_TRACT | Status: DC
  Administered 2018-06-27 – 2018-06-28 (×7): 3 mL via RESPIRATORY_TRACT
  Filled 2018-06-26 (×7): qty 3

## 2018-06-26 MED ORDER — IPRATROPIUM-ALBUTEROL 0.5-2.5 (3) MG/3ML IN SOLN
3.0000 mL | Freq: Once | RESPIRATORY_TRACT | Status: AC
  Administered 2018-06-26: 3 mL via RESPIRATORY_TRACT
  Filled 2018-06-26: qty 3

## 2018-06-26 MED ORDER — IPRATROPIUM BROMIDE 0.02 % IN SOLN: 0.5000 mg | Freq: Four times a day (QID) | RESPIRATORY_TRACT | Status: DC

## 2018-06-26 MED ORDER — ALBUTEROL SULFATE (2.5 MG/3ML) 0.083% IN NEBU
5.0000 mg | INHALATION_SOLUTION | Freq: Once | RESPIRATORY_TRACT | Status: AC
  Administered 2018-06-26: 5 mg via RESPIRATORY_TRACT
  Filled 2018-06-26: qty 6

## 2018-06-26 MED ORDER — LORAZEPAM 2 MG/ML IJ SOLN
0.5000 mg | Freq: Once | INTRAMUSCULAR | Status: AC
  Administered 2018-06-26: 0.5 mg via INTRAVENOUS
  Filled 2018-06-26: qty 1

## 2018-06-26 MED ORDER — ALBUTEROL (5 MG/ML) CONTINUOUS INHALATION SOLN
10.0000 mg/h | INHALATION_SOLUTION | RESPIRATORY_TRACT | Status: DC
  Administered 2018-06-26: 10 mg/h via RESPIRATORY_TRACT
  Filled 2018-06-26: qty 20

## 2018-06-26 NOTE — ED Provider Notes (Signed)
[facility], [place] EMERGENCY DEPARTMENT Provider Note   CSN: [id number] Arrival date & time: 06/26/18  1805     History   Chief Complaint Chief Complaint  Patient presents with  . Chest Pain    HPI Bryan [name] is a 62 y.o. male.  HPI Patient presents with concern of chest tightness, fatigue. He notes that he was hospitalized twice in the past 3 months for pneumonia, and since his most recent hospitalization 3 weeks ago he has had episodic chest tightness, dyspnea, cough, and generalized weakness. No clear precipitating factors, and episodes are more frequent and more severe over the past few days. He continues to take all medication as directed. Though he is a poor historian, but it seems as though he does have a history of hypertension and COPD. He is here with his male companion who assists with the HPI. Patient does not smoke.  Past Medical History:  Diagnosis Date  . Acid reflux   . Arthritis   . Asthma   . Chronic pain    with leg and back pain (disc problem)  . COPD (chronic obstructive pulmonary disease) ([place])   . Diabetes mellitus without complication ([place])   . Headache    HX OF  . History of upper GI x-ray series    to follow showed large duodenal ulcer H pylori serologies were negative  . Hypertension   . Pneumonia    07/17/17  . Pre-diabetes   . Stroke [facility])    TIA MINI STROKE  . Tubular adenoma     Patient Active Problem List   Diagnosis Date Noted  . Chronic respiratory failure with hypoxia ([place]) 06/04/2018  . HCAP (healthcare-associated pneumonia) 06/04/2018  . Acute respiratory failure with hypoxia ([place]) 04/14/2018  . Pulmonary nodule 04/14/2018  . CKD (chronic kidney disease), stage III ([place]) 04/14/2018  . Type 2 diabetes mellitus with hyperlipidemia ([place]) 03/12/2018  . BPH with obstruction/lower urinary tract symptoms 08/17/2017  . Left sided numbness 07/22/2017  . TIA (transient ischemic attack) 07/22/2017  . Pneumonia 07/14/2017  . COPD with  acute exacerbation ([place]) 07/14/2017  . Constipation 07/10/2017  . Hx of adenomatous colonic polyps 05/01/2017  . Hypertension   . Chronic pain   . Heme positive stool 10/20/2014  . Epigastric pain 06/19/2014  . Dysphagia 06/19/2014  . AP (abdominal pain) 11/13/2013  . Early satiety 11/13/2013  . COPD, severe ([place]) 02/22/2013  . Low back pain 11/19/2012  . COLITIS 05/12/2009  . ABDOMINAL PAIN, LEFT LOWER QUADRANT 05/12/2009  . DUODENAL ULCER, HX OF 05/12/2009  . ALCOHOL USE 05/11/2009  . GERD 05/11/2009  . Diarrhea 05/11/2009  . SPONDYLOSIS, CERVICAL 07/03/2007  . NECK PAIN 07/03/2007    Past Surgical History:  Procedure Laterality Date  . BACK SURGERY  7/05; 5/09    Dr.Hirsch,3 lumbar  X3  . BACK SURGERY    . COLONOSCOPY  Feb 2012   Dr. Gala Romney: normal rectum, pedunculate polyp removed but not recovered  . COLONOSCOPY WITH ESOPHAGOGASTRODUODENOSCOPY (EGD) N/A 11/18/2013   Dr.Rourk- tcs= normal rectum, multipe polyps about the ileocecal valve and distal transverse colon o/w the remainder of the colonic mucosa appeared normal bx= tubular adenoma. EGD= normal esophagus, stomach with scattered erosions mottling, friablility, no ulcer or infiltrating process patent pylorus bx= chronic inflammation. next TCS 11/2016  . COLONOSCOPY WITH PROPOFOL N/A 05/25/2017   Dr. Gala Romney: sigmoid diverticulosis, one 4 mm hyerplastic rectal polyp, ascending colonic AVMs surveillance 2023  . ESOPHAGOGASTRODUODENOSCOPY  1/06   Dr. Volney American  esophageal erosions,U-shaped stomach,marked erosions and edema of the bulb without discrete ulcer disease.   . ESOPHAGOGASTRODUODENOSCOPY (EGD) WITH PROPOFOL N/A 05/25/2017   Dr. Gala Romney: reflux esophagitis s/p empiric dilation, normal stomach and duodenum  . MALONEY DILATION N/A 05/25/2017   Procedure: Venia Minks DILATION;  Surgeon: Daneil Dolin, MD;  Location: [facility] ENDO SUITE;  Service: Endoscopy;  Laterality: N/A;  . NECK SURGERY  10/2007   PLATE IN NECK  . POLYPECTOMY   05/25/2017   Procedure: POLYPECTOMY;  Surgeon: Daneil Dolin, MD;  Location: [facility] ENDO SUITE;  Service: Endoscopy;;  rectal  . TRANSURETHRAL RESECTION OF PROSTATE N/A 08/17/2017   Procedure: TRANSURETHRAL RESECTION OF THE PROSTATE (TURP);  Surgeon: Irine Seal, MD;  Location: [facility] ORS;  Service: Urology;  Laterality: N/A;        Home Medications    Prior to Admission medications   Medication Sig Start Date End Date Taking? Authorizing Provider  albuterol (PROVENTIL HFA;VENTOLIN HFA) 108 (90 Base) MCG/ACT inhaler Inhale 1-2 puffs every 4 (four) hours as needed into the lungs for wheezing or shortness of breath. Do not use with nebulizer Patient taking differently: Inhale 1 puff into the lungs every 4 (four) hours as needed for wheezing or shortness of breath. Do not use with nebulizer 07/17/17  Yes [name], MD  albuterol (PROVENTIL) (2.5 MG/3ML) 0.083% nebulizer solution Take 3 mLs (2.5 mg total) every 4 (four) hours as needed by nebulization for wheezing or shortness of breath. 07/17/17  Yes [name], MD  amLODipine (NORVASC) 5 MG tablet Take 1 tablet (5 mg total) by mouth daily. 03/13/18 03/13/19 Yes [name], MD  fluticasone (FLONASE) 50 MCG/ACT nasal spray Place 1 spray into both nostrils daily. 06/06/18  Yes Barton Dubois, MD  atorvastatin (LIPITOR) 40 MG tablet Take 1 tablet (40 mg total) daily by mouth. Patient not taking: Reported on 06/26/2018 07/23/17 07/23/18  [name], MD  Fluticasone-Umeclidin-Vilant (TRELEGY ELLIPTA) 100-62.5-25 MCG/INH AEPB Inhale 1 puff into the lungs daily.    [provider]  gabapentin (NEURONTIN) 300 MG capsule Take 1 capsule (300 mg total) by mouth 2 (two) times daily. Pt. Says he is taking twice daily. Patient not taking: Reported on 06/26/2018 06/05/18   Barton Dubois, MD  guaiFENesin (MUCINEX) 600 MG 12 hr tablet Take 1 tablet (600 mg total) by mouth 2 (two) times daily. Patient not taking: Reported on 06/26/2018  06/05/18 06/05/19  Barton Dubois, MD  linaclotide [facility]) 72 MCG capsule Take 1 capsule (72 mcg total) by mouth daily before breakfast. Patient not taking: Reported on 06/26/2018 05/08/18   [name], NP  metFORMIN (GLUCOPHAGE) 500 MG tablet Take 1 tablet (500 mg total) daily with breakfast by mouth. Patient not taking: Reported on 06/04/2018 07/23/17 07/23/18  [name], MD  pantoprazole (PROTONIX) 40 MG tablet Take 1 tablet (40 mg total) by mouth 2 (two) times daily before a meal. Patient not taking: Reported on 06/26/2018 03/19/18   [name], NP  predniSONE (DELTASONE) 20 MG tablet Take 3 tabs daily X 1 day; then 2 tabs daily X 2 days; then 1 tablet by mouth daily X 2 days; then 1/2 tablet by mouth daily X 3 days and stop prednisone. Patient not taking: Reported on 06/26/2018 06/05/18   Barton Dubois, MD  tamsulosin (FLOMAX) 0.4 MG CAPS capsule Take 1 capsule by mouth daily. 12/18/17   [provider]  traMADol (ULTRAM) 50 MG tablet Take 1 tablet (50 mg total) by mouth every  6 (six) hours as needed. Patient not taking: Reported on 06/04/2018 04/27/18   [name], PA-C    Family History Family History  Problem Relation Age of Onset  . Cancer Father   . Asthma Mother   . Colon cancer Neg Hx     Social History Social History   Tobacco Use  . Smoking status: Former Smoker    Packs/day: 0.50    Years: 40.00    Pack years: 20.00    Types: Cigarettes    Last attempt to quit: 11/11/2014    Years since quitting: 3.6  . Smokeless tobacco: Never Used  Substance Use Topics  . Alcohol use: No    Alcohol/week: 0.0 standard drinks  . Drug use: Yes    Types: Marijuana    Comment:  'every once in a while"      Allergies   Patient has no known allergies.   Review of Systems Review of Systems  Constitutional:       Per HPI, otherwise negative  HENT:       Per HPI, otherwise negative  Respiratory:       Per HPI, otherwise negative  Cardiovascular:       Per  HPI, otherwise negative  Gastrointestinal: Negative for vomiting.  Endocrine:       Negative aside from HPI  Genitourinary:       Neg aside from HPI   Musculoskeletal:       Per HPI, otherwise negative  Skin: Negative.   Neurological: Negative for syncope.     Physical Exam Updated Vital Signs BP 139/79   Pulse 73   Temp 98.6 F (37 C) (Temporal)   Resp 18   Ht 5' (1.524 m)   Wt 55.9 kg   SpO2 97%   BMI 24.07 kg/m   Physical Exam  Constitutional: He is oriented to person, place, and time. He appears well-developed. No distress.  HENT:  Head: Normocephalic and atraumatic.  Eyes: Conjunctivae and EOM are normal.  Cardiovascular: Normal rate and regular rhythm.  Pulmonary/Chest: He has decreased breath sounds.  Abdominal: He exhibits no distension.  Musculoskeletal: He exhibits no edema.  Neurological: He is alert and oriented to person, place, and time.  Skin: Skin is warm and dry.  Psychiatric: He has a normal mood and affect.  Nursing note and vitals reviewed.   ED Treatments / Results  Labs (all labs ordered are listed, but only abnormal results are displayed) Labs Reviewed  COMPREHENSIVE METABOLIC PANEL - Abnormal; Notable for the following components:      Result Value   Potassium 3.4 (*)    Glucose, Bld 127 (*)    Creatinine, Ser 1.54 (*)    Calcium 8.8 (*)    GFR calc non Af Amer 47 (*)    GFR calc Af Amer 54 (*)    All other components within normal limits  MAGNESIUM - Abnormal; Notable for the following components:   Magnesium 1.4 (*)    All other components within normal limits  CBC WITH DIFFERENTIAL/PLATELET - Abnormal; Notable for the following components:   Hemoglobin 12.8 (*)    All other components within normal limits  GLUCOSE, CAPILLARY - Abnormal; Notable for the following components:   Glucose-Capillary 117 (*)    All other components within normal limits  TROPONIN I  BRAIN NATRIURETIC PEPTIDE  PROTIME-INR  CBG MONITORING, ED     EKG EKG Interpretation  Date/Time:  Tuesday June 26 2018 18:34:08 EDT Ventricular Rate:  66  PR Interval:  126 QRS Duration: 76 QT Interval:  362 QTC Calculation: 422 R Axis:   100 Text Interpretation:  Normal sinus rhythm Rightward axis No significant change since last tracing Borderline ECG Confirmed by Carmin Muskrat [phone]) on 06/26/2018 6:38:59 PM   Radiology Dg Chest 2 View  Result Date: 06/26/2018 CLINICAL DATA:  Central chest pain, cough and congestion EXAM: CHEST - 2 VIEW COMPARISON:  CT 06/04/2018, radiograph 06/04/2018 FINDINGS: Postsurgical changes in the cervical spine. No focal opacity or pleural effusion. Stable cardiomediastinal silhouette. No pneumothorax. IMPRESSION: No active cardiopulmonary disease. Electronically Signed   By: Donavan Foil M.D.   On: 06/26/2018 19:12    Procedures Procedures (including critical care time)  Medications Ordered in ED Medications  magnesium sulfate IVPB 1 g 100 mL (1 g Intravenous New Bag/Given 06/26/18 2037)  0.9 %  sodium chloride infusion (500 mLs Intravenous New Bag/Given 06/26/18 2035)  albuterol (PROVENTIL,VENTOLIN) solution continuous neb (has no administration in time range)  albuterol (PROVENTIL) (2.5 MG/3ML) 0.083% nebulizer solution 5 mg (5 mg Nebulization Given 06/26/18 1945)  methylPREDNISolone sodium succinate (SOLU-MEDROL) 125 mg/2 mL injection 125 mg (125 mg Intravenous Given 06/26/18 1925)  ipratropium-albuterol (DUONEB) 0.5-2.5 (3) MG/3ML nebulizer solution 3 mL (3 mLs Nebulization Given 06/26/18 2012)  LORazepam (ATIVAN) injection 0.5 mg (0.5 mg Intravenous Given 06/26/18 2036)     Initial Impression / Assessment and Plan / ED Course  I have reviewed the triage vital signs and the nursing notes.  Pertinent labs & imaging results that were available during my care of the patient were reviewed by me and considered in my medical decision making (see chart for details).     Now: Patient anxious,  sitting on the edge of the bed, though he describes inability to breathe he is using a telephone. Initial findings reassuring, no evidence for pneumonia.  9:12 PM Patient has received albuterol, DuoNeb, steroids, continues to have tachypnea, tachycardia, diminished breath sounds and increased work of breathing. Patient has oxygen saturation 92% with 4 L via nasal cannula, which is a new requirement for him.  Given the increased work of breathing, substantial amount of albuterol thus far, patient will require admission to the stepdown unit for further evaluation and management. Final Clinical Impressions(s) / ED Diagnoses  COPD exacerbation  CRITICAL CARE Performed by: Carmin Muskrat Total critical care time: 35 minutes Critical care time was exclusive of separately billable procedures and treating other patients. Critical care was necessary to treat or prevent imminent or life-threatening deterioration. Critical care was time spent personally by me on the following activities: development of treatment plan with patient and/or surrogate as well as nursing, discussions with consultants, evaluation of patient's response to treatment, examination of patient, obtaining history from patient or surrogate, ordering and performing treatments and interventions, ordering and review of laboratory studies, ordering and review of radiographic studies, pulse oximetry and re-evaluation of patient's condition.    Carmin Muskrat, MD 06/26/18 2118

## 2018-06-26 NOTE — ED Triage Notes (Signed)
Pt is having central chest pain and back pain. Pt is also congested and has been coughing. Symptoms started yesterday. Was recently discharged from the hospital with pneumonia.

## 2018-06-26 NOTE — H&P (Signed)
History and Physical    SANFORD LINDBLAD URK:[id number] DOB: Apr 01, 1956 DOA: 06/26/2018  PCP: Rosita Fire, MD   Patient coming from: Home.  I have personally briefly reviewed patient's old medical records in [place]  Chief Complaint: Shortness of breath.  HPI: Bryan [name] is a 62 y.o. male with medical history significant of GERD, PUD, osteoarthritis, asthma, chronic back pain, COPD, type 2 diabetes, history of headaches, history of TIA, history of recent admission for HCAP who is coming to the emergency department with complaints of progressively worse dyspnea since yesterday evening associated with pleuritic chest pain, wheezing, fatigue, but denies productive or nonproductive cough.  No fever, chills, hemoptysis, sore throat or rhinorrhea.  No sick contacts travel history.  No anginal CP, palpitations, diaphoresis, PND, orthopnea or pitting edema of the lower extremities.  No nausea, emesis, diarrhea, constipation, melena or hematochezia, however complains of chronic burning epigastric pain that gets worse with food intake.  He also complains of burning dysuria that has been going on for about 6 months.  He has talked to his urologist, who prescribed tamsulosin for him, but no significant relief per patient.  He denies gross hematuria or oliguria.  No heat or cold intolerance.  No polyuria, polydipsia, polyphagia or blurred vision.  The patient stated that his blood glucose is in the low 100s at night and in the evenings.  ED Course: Initial vital signs temperature 98.6 F, pulse 88, respirations 20, blood pressure 141/78 mmHg and O2 sat 90% on room air.  The patient received a DuoNeb, then 5 mg albuterol continuous neb then 10 mg albuterol continuous neb, Solu-Medrol 125 mg, lorazepam 0.5 mg IVP x1 dose and K-Dur 40 mEq p.o. x1 dose.  Initial vital signs temperature 98.6 F, pulse 88, respirations 20, blood pressure 141/78 mmHg and O2 sat 90% on room air.  White count 7.0, hemoglobin 12.8  g/dL and platelets 260.  PT 12.9 and INR 0.98.  Potassium 3.4 mmol/L.  BUN 15, creatinine 1.54, calcium 8.8, glucose 127 and magnesium was 1.4 mg/dL.  All other CMP values were within normal limits.  Troponin level was normal.  BNP was 66.0 pg/mL.  His chest radiograph did not show any active cardiopulmonary pathology.  Review of Systems: As per HPI otherwise 10 point review of systems negative.   Past Medical History:  Diagnosis Date  . Acid reflux   . Arthritis   . Asthma   . Chronic pain    with leg and back pain (disc problem)  . COPD (chronic obstructive pulmonary disease) ([place])   . Diabetes mellitus without complication ([place])   . Headache    HX OF  . History of upper GI x-ray series    to follow showed large duodenal ulcer H pylori serologies were negative  . Hypertension   . Pneumonia    07/17/17  . Pre-diabetes   . Stroke [facility])    TIA MINI STROKE  . Tubular adenoma     Past Surgical History:  Procedure Laterality Date  . BACK SURGERY  7/05; 5/09    Dr.Hirsch,3 lumbar  X3  . BACK SURGERY    . COLONOSCOPY  Feb 2012   Dr. Gala Romney: normal rectum, pedunculate polyp removed but not recovered  . COLONOSCOPY WITH ESOPHAGOGASTRODUODENOSCOPY (EGD) N/A 11/18/2013   Dr.Rourk- tcs= normal rectum, multipe polyps about the ileocecal valve and distal transverse colon o/w the remainder of the colonic mucosa appeared normal bx= tubular adenoma. EGD= normal esophagus, stomach with scattered erosions  mottling, friablility, no ulcer or infiltrating process patent pylorus bx= chronic inflammation. next TCS 11/2016  . COLONOSCOPY WITH PROPOFOL N/A 05/25/2017   Dr. Gala Romney: sigmoid diverticulosis, one 4 mm hyerplastic rectal polyp, ascending colonic AVMs surveillance 2023  . ESOPHAGOGASTRODUODENOSCOPY  1/06   Dr. Volney American esophageal erosions,U-shaped stomach,marked erosions and edema of the bulb without discrete ulcer disease.   . ESOPHAGOGASTRODUODENOSCOPY (EGD) WITH PROPOFOL N/A 05/25/2017   Dr.  Gala Romney: reflux esophagitis s/p empiric dilation, normal stomach and duodenum  . MALONEY DILATION N/A 05/25/2017   Procedure: Venia Minks DILATION;  Surgeon: Daneil Dolin, MD;  Location: [facility];  Service: Endoscopy;  Laterality: N/A;  . NECK SURGERY  10/2007   PLATE IN NECK  . POLYPECTOMY  05/25/2017   Procedure: POLYPECTOMY;  Surgeon: Daneil Dolin, MD;  Location: [facility]ITE;  Service: Endoscopy;;  rectal  . TRANSURETHRAL RESECTION OF PROSTATE N/A 08/17/2017   Procedure: TRANSURETHRAL RESECTION OF THE PROSTATE (TURP);  Surgeon: Irine Seal, MD;  Location: [facility];  Service: Urology;  Laterality: N/A;     reports that he quit smoking about 3 years ago. His smoking use included cigarettes. He has a 20.00 pack-year smoking history. He has never used smokeless tobacco. He reports that he has current or past drug history. Drug: Marijuana. He reports that he does not drink alcohol.  No Known Allergies  Family History  Problem Relation Age of Onset  . Cancer Father   . Asthma Mother   . Colon cancer Neg Hx     Prior to Admission medications   Medication Sig Start Date End Date Taking? Authorizing Provider  albuterol (PROVENTIL HFA;VENTOLIN HFA) 108 (90 Base) MCG/ACT inhaler Inhale 1-2 puffs every 4 (four) hours as needed into the lungs for wheezing or shortness of breath. Do not use with nebulizer Patient taking differently: Inhale 1 puff into the lungs every 4 (four) hours as needed for wheezing or shortness of breath. Do not use with nebulizer 07/17/17  Yes [name], MD  albuterol (PROVENTIL) (2.5 MG/3ML) 0.083% nebulizer solution Take 3 mLs (2.5 mg total) every 4 (four) hours as needed by nebulization for wheezing or shortness of breath. 07/17/17  Yes [name], MD  amLODipine (NORVASC) 5 MG tablet Take 1 tablet (5 mg total) by mouth daily. 03/13/18 03/13/19 Yes [name], MD  fluticasone (FLONASE) 50 MCG/ACT nasal spray Place 1 spray into both nostrils daily.  06/06/18  Yes Barton Dubois, MD  atorvastatin (LIPITOR) 40 MG tablet Take 1 tablet (40 mg total) daily by mouth. Patient not taking: Reported on 06/26/2018 07/23/17 07/23/18  [name], MD  Fluticasone-Umeclidin-Vilant (TRELEGY ELLIPTA) 100-62.5-25 MCG/INH AEPB Inhale 1 puff into the lungs daily.    [provider]  gabapentin (NEURONTIN) 300 MG capsule Take 1 capsule (300 mg total) by mouth 2 (two) times daily. Pt. Says he is taking twice daily. Patient not taking: Reported on 06/26/2018 06/05/18   Barton Dubois, MD  guaiFENesin (MUCINEX) 600 MG 12 hr tablet Take 1 tablet (600 mg total) by mouth 2 (two) times daily. Patient not taking: Reported on 06/26/2018 06/05/18 06/05/19  Barton Dubois, MD  linaclotide [facility]) 72 MCG capsule Take 1 capsule (72 mcg total) by mouth daily before breakfast. Patient not taking: Reported on 06/26/2018 05/08/18   [name], NP  metFORMIN (GLUCOPHAGE) 500 MG tablet Take 1 tablet (500 mg total) daily with breakfast by mouth. Patient not taking: Reported on 06/04/2018 07/23/17 07/23/18  [name], MD  pantoprazole (PROTONIX) 40 MG tablet Take 1 tablet (40 mg total) by mouth 2 (two) times daily before a meal. Patient not taking: Reported on 06/26/2018 03/19/18   [name], NP  predniSONE (DELTASONE) 20 MG tablet Take 3 tabs daily X 1 day; then 2 tabs daily X 2 days; then 1 tablet by mouth daily X 2 days; then 1/2 tablet by mouth daily X 3 days and stop prednisone. Patient not taking: Reported on 06/26/2018 06/05/18   Barton Dubois, MD  tamsulosin (FLOMAX) 0.4 MG CAPS capsule Take 1 capsule by mouth daily. 12/18/17   [provider]  traMADol (ULTRAM) 50 MG tablet Take 1 tablet (50 mg total) by mouth every 6 (six) hours as needed. Patient not taking: Reported on 06/04/2018 04/27/18   [name], PA-C    Physical Exam: Vitals:   06/26/18 2100 06/26/18 2130 06/26/18 2145 06/26/18 2200  BP: (!) 147/83 128/84  (!) 151/92  Pulse: (!)  101 98  (!) 105  Resp: (!) 36 (!) 29  (!) 25  Temp:      TempSrc:      SpO2: (!) 89% 94% 100% 95%  Weight:      Height:        Constitutional: Mildly anxious, but stated he does not feel dyspneic. Eyes: PERRL, lids and conjunctivae normal ENMT: Mucous membranes are mildly dry. Posterior pharynx clear of any exudate or lesions. Neck: normal, supple, no masses, no thyromegaly Respiratory: Severely decreased breath sounds on both lung fields with bilateral wheezing, no crackles.  Tachypneic in the high 20s and low 30s. No accessory muscle use.  Cardiovascular: Regular rate and rhythm, no murmurs / rubs / gallops. No extremity edema. 2+ pedal pulses. No carotid bruits.  Abdomen: Soft, positive mild tenderness, no guarding/rebound/masses palpated. No hepatosplenomegaly. Bowel sounds positive.  Musculoskeletal: no clubbing / cyanosis. No gross joint deformity upper and lower extremities. Good ROM, no contractures. Normal muscle tone.  Skin: no rashes, lesions, ulcers. No induration on limited dermatological exam. Neurologic: CN 2-12 grossly intact. Sensation intact, DTR normal. Strength 5/5 in all 4.  Psychiatric: Normal judgment and insight. Alert and oriented x 3.  Mildly anxious mood.   Labs on Admission: I have personally reviewed following labs and imaging studies  CBC: Recent Labs  Lab 06/26/18 1842  WBC 7.0  NEUTROABS 3.5  HGB 12.8*  HCT 41.8  MCV 86.2  PLT 829   Basic Metabolic Panel: Recent Labs  Lab 06/26/18 1842  NA 140  K 3.4*  CL 106  CO2 26  GLUCOSE 127*  BUN 15  CREATININE 1.54*  CALCIUM 8.8*  MG 1.4*  PHOS 3.6   GFR: Estimated Creatinine Clearance: 35.2 mL/min (A) (by C-G formula based on SCr of 1.54 mg/dL (H)). Liver Function Tests: Recent Labs  Lab 06/26/18 1842  AST 18  ALT 11  ALKPHOS 90  BILITOT 0.5  PROT 7.5  ALBUMIN 3.8   No results for input(s): LIPASE, AMYLASE in the last 168 hours. No results for input(s): AMMONIA in the last 168  hours. Coagulation Profile: Recent Labs  Lab 06/26/18 1842  INR 0.98   Cardiac Enzymes: Recent Labs  Lab 06/26/18 1842  TROPONINI <0.03   BNP (last 3 results) No results for input(s): PROBNP in the last 8760 hours. HbA1C: No results for input(s): HGBA1C in the last 72 hours. CBG: Recent Labs  Lab 06/26/18 1852  GLUCAP 117*   Lipid Profile: No results for input(s): CHOL, HDL, LDLCALC, TRIG, CHOLHDL, LDLDIRECT  in the last 72 hours. Thyroid Function Tests: No results for input(s): TSH, T4TOTAL, FREET4, T3FREE, THYROIDAB in the last 72 hours. Anemia Panel: No results for input(s): VITAMINB12, FOLATE, FERRITIN, TIBC, IRON, RETICCTPCT in the last 72 hours. Urine analysis:    Component Value Date/Time   COLORURINE YELLOW 06/04/2018 [place] 06/04/2018 1231   LABSPEC 1.012 06/04/2018 1231   PHURINE 6.0 06/04/2018 1231   GLUCOSEU NEGATIVE 06/04/2018 1231   HGBUR NEGATIVE 06/04/2018 1231   BILIRUBINUR NEGATIVE 06/04/2018 1231   KETONESUR NEGATIVE 06/04/2018 1231   PROTEINUR NEGATIVE 06/04/2018 1231   UROBILINOGEN 0.2 07/08/2015 1314   NITRITE NEGATIVE 06/04/2018 1231   LEUKOCYTESUR NEGATIVE 06/04/2018 1231    Radiological Exams on Admission: Dg Chest 2 View  Result Date: 06/26/2018 CLINICAL DATA:  Central chest pain, cough and congestion EXAM: CHEST - 2 VIEW COMPARISON:  CT 06/04/2018, radiograph 06/04/2018 FINDINGS: Postsurgical changes in the cervical spine. No focal opacity or pleural effusion. Stable cardiomediastinal silhouette. No pneumothorax. IMPRESSION: No active cardiopulmonary disease. Electronically Signed   By: Donavan Foil M.D.   On: 06/26/2018 19:12   03/13/2018 echocardiogram  ------------------------------------------------------------------- LV EF: 60% -   65%  ------------------------------------------------------------------- Indications:      Chest pain  786.51.  ------------------------------------------------------------------- History:   PMH:  Former Smoker. GERD, ETOH.  Transient ischemic attack.  Chronic obstructive pulmonary disease.  Risk factors: Hypertension. Diabetes mellitus.  ------------------------------------------------------------------- Study Conclusions  - Left ventricle: The cavity size was normal. Wall thickness was   normal. Systolic function was normal. The estimated ejection   fraction was in the range of 60% to 65%. Wall motion was normal;   there were no regional wall motion abnormalities. Left   ventricular diastolic function parameters were normal. - Aortic valve: Mildly calcified annulus. Trileaflet. There was no   stenosis. - Atrial septum: No defect or patent foramen ovale was identified. - Tricuspid valve: There was moderate regurgitation. - Pulmonary arteries: Systolic pressure was moderately increased.   PA peak pressure: 49 mm Hg (S).  EKG: Independently reviewed.  Vent. rate 82 BPM PR interval 126 ms QRS duration 76 ms QT/QTc 362/422 ms P-R-T axes 75 100 29 Normal sinus rhythm Rightward axis No significant change since last tracing Borderline ECG  Assessment/Plan Principal Problem:   COPD with acute exacerbation (HCC)   Chronic respiratory failure with hypoxia (HCC) Continue supplemental oxygen. Scheduled and as needed bronchodilators. Guaifenesin 600 mg p.o. twice daily. Toradol 30 mg IVP x1 dose for pleuritic chest pain. Continue Solu-Medrol 40 mg IVP every 6 hours. Close CBG monitoring with RI SS coverage while on glucocorticoids.  Active Problems:   BPH (benign prostatic hyperplasia) Continue tamsulosin 0.4 mg p.o. daily.    GERD Resume Protonix 40 mg p.o. daily.    Hypertension Continue amlodipine 5 mg p.o. daily. Monitor blood pressure.    Type 2 diabetes mellitus with hyperlipidemia (HCC) Last hemoglobin A1c 6.9% in July 1 this year. Carbohydrate modified  diet. CBG monitoring with regular insulin sliding scale while on glucocorticoids.    Hypokalemia Replacing. Follow-up potassium level. Magnesium being repleted as well.    Hypomagnesemia Replacing. Follow-up magnesium level as needed.    Anemia Check anemia profile. Check occult blood in stool. Monitor hematocrit and hemoglobin.    DVT prophylaxis: Heparin SQ. Code Status: Full code. Family Communication: His son was present in the ED room. Disposition Plan: Observation for COPD exacerbation treatment and electrolyte correction. Consults called: Admission status: Observation/stepdown.   [name] MD  [facility] Pager [phone]  If 7PM-7AM, please contact night-coverage [email or web address] Password [email or web address]  06/26/2018, 10:39 PM

## 2018-06-27 DIAGNOSIS — K219 Gastro-esophageal reflux disease without esophagitis: Secondary | ICD-10-CM | POA: Diagnosis present

## 2018-06-27 DIAGNOSIS — I129 Hypertensive chronic kidney disease with stage 1 through stage 4 chronic kidney disease, or unspecified chronic kidney disease: Secondary | ICD-10-CM | POA: Diagnosis present

## 2018-06-27 DIAGNOSIS — Z7951 Long term (current) use of inhaled steroids: Secondary | ICD-10-CM | POA: Diagnosis not present

## 2018-06-27 DIAGNOSIS — Z8673 Personal history of transient ischemic attack (TIA), and cerebral infarction without residual deficits: Secondary | ICD-10-CM | POA: Diagnosis not present

## 2018-06-27 DIAGNOSIS — N400 Enlarged prostate without lower urinary tract symptoms: Secondary | ICD-10-CM | POA: Diagnosis present

## 2018-06-27 DIAGNOSIS — Z87891 Personal history of nicotine dependence: Secondary | ICD-10-CM | POA: Diagnosis not present

## 2018-06-27 DIAGNOSIS — I10 Essential (primary) hypertension: Secondary | ICD-10-CM | POA: Diagnosis not present

## 2018-06-27 DIAGNOSIS — G8929 Other chronic pain: Secondary | ICD-10-CM | POA: Diagnosis present

## 2018-06-27 DIAGNOSIS — E785 Hyperlipidemia, unspecified: Secondary | ICD-10-CM | POA: Diagnosis present

## 2018-06-27 DIAGNOSIS — Z79899 Other long term (current) drug therapy: Secondary | ICD-10-CM | POA: Diagnosis not present

## 2018-06-27 DIAGNOSIS — K210 Gastro-esophageal reflux disease with esophagitis: Secondary | ICD-10-CM | POA: Diagnosis not present

## 2018-06-27 DIAGNOSIS — J441 Chronic obstructive pulmonary disease with (acute) exacerbation: Secondary | ICD-10-CM | POA: Diagnosis present

## 2018-06-27 DIAGNOSIS — Z8701 Personal history of pneumonia (recurrent): Secondary | ICD-10-CM | POA: Diagnosis not present

## 2018-06-27 DIAGNOSIS — Z7984 Long term (current) use of oral hypoglycemic drugs: Secondary | ICD-10-CM | POA: Diagnosis not present

## 2018-06-27 DIAGNOSIS — N183 Chronic kidney disease, stage 3 (moderate): Secondary | ICD-10-CM | POA: Diagnosis present

## 2018-06-27 DIAGNOSIS — E876 Hypokalemia: Secondary | ICD-10-CM | POA: Diagnosis present

## 2018-06-27 DIAGNOSIS — E1169 Type 2 diabetes mellitus with other specified complication: Secondary | ICD-10-CM | POA: Diagnosis present

## 2018-06-27 DIAGNOSIS — J9611 Chronic respiratory failure with hypoxia: Secondary | ICD-10-CM | POA: Diagnosis present

## 2018-06-27 DIAGNOSIS — D631 Anemia in chronic kidney disease: Secondary | ICD-10-CM | POA: Diagnosis present

## 2018-06-27 LAB — IRON AND TIBC
Iron: 33 ug/dL — ABNORMAL LOW (ref 45–182)
SATURATION RATIOS: 9 % — AB (ref 17.9–39.5)
TIBC: 386 ug/dL (ref 250–450)
UIBC: 353 ug/dL

## 2018-06-27 LAB — FERRITIN: Ferritin: 8 ng/mL — ABNORMAL LOW (ref 24–336)

## 2018-06-27 LAB — GLUCOSE, CAPILLARY
GLUCOSE-CAPILLARY: 138 mg/dL — AB (ref 70–99)
Glucose-Capillary: 142 mg/dL — ABNORMAL HIGH (ref 70–99)
Glucose-Capillary: 170 mg/dL — ABNORMAL HIGH (ref 70–99)
Glucose-Capillary: 188 mg/dL — ABNORMAL HIGH (ref 70–99)

## 2018-06-27 LAB — MRSA PCR SCREENING: MRSA by PCR: NEGATIVE

## 2018-06-27 LAB — RETICULOCYTES
Immature Retic Fract: 6.4 % (ref 2.3–15.9)
RBC.: 4.59 MIL/uL (ref 4.22–5.81)
RETIC CT PCT: 0.6 % (ref 0.4–3.1)
Retic Count, Absolute: 25.7 10*3/uL (ref 19.0–186.0)

## 2018-06-27 LAB — FOLATE: Folate: 6.2 ng/mL (ref >5.9)

## 2018-06-27 LAB — VITAMIN B12: Vitamin B-12: 487 pg/mL (ref 180–914)

## 2018-06-27 MED ORDER — DOXYCYCLINE HYCLATE 100 MG PO TABS
100.0000 mg | ORAL_TABLET | Freq: Two times a day (BID) | ORAL | Status: DC
  Administered 2018-06-27 – 2018-06-29 (×5): 100 mg via ORAL
  Filled 2018-06-27 (×5): qty 1

## 2018-06-27 MED ORDER — CHLORHEXIDINE GLUCONATE 0.12 % MT SOLN
15.0000 mL | Freq: Two times a day (BID) | OROMUCOSAL | Status: DC
  Administered 2018-06-27 – 2018-06-29 (×4): 15 mL via OROMUCOSAL
  Filled 2018-06-27 (×4): qty 15

## 2018-06-27 MED ORDER — ORAL CARE MOUTH RINSE
15.0000 mL | Freq: Two times a day (BID) | OROMUCOSAL | Status: DC
  Administered 2018-06-27: 15 mL via OROMUCOSAL

## 2018-06-27 NOTE — Plan of Care (Signed)
  Problem: Education: Goal: Knowledge of General Education information will improve Description Including pain rating scale, medication(s)/side effects and non-pharmacologic comfort measures Outcome: Progressing   Problem: Health Behavior/Discharge Planning: Goal: Ability to manage health-related needs will improve Outcome: Progressing   Problem: Clinical Measurements: Goal: Ability to maintain clinical measurements within normal limits will improve Outcome: Progressing Goal: Respiratory complications will improve Outcome: Progressing Goal: Cardiovascular complication will be avoided Outcome: Progressing   Problem: Nutrition: Goal: Adequate nutrition will be maintained Outcome: Progressing   Problem: Coping: Goal: Level of anxiety will decrease Outcome: Progressing   Problem: Pain Managment: Goal: General experience of comfort will improve Outcome: Progressing   Problem: Safety: Goal: Ability to remain free from injury will improve Outcome: Progressing   Problem: Skin Integrity: Goal: Risk for impaired skin integrity will decrease Outcome: Progressing   Problem: Education: Goal: Knowledge of disease or condition will improve Outcome: Progressing Goal: Knowledge of the prescribed therapeutic regimen will improve Outcome: Progressing   Problem: Activity: Goal: Ability to tolerate increased activity will improve Outcome: Progressing   Problem: Respiratory: Goal: Ability to maintain a clear airway will improve Outcome: Progressing Goal: Levels of oxygenation will improve Outcome: Progressing

## 2018-06-27 NOTE — Care Management Note (Addendum)
Case Management Note  Patient Details  Name: Bryan [name] MRN: [id number] Date of Birth: Oct 31, 1955  Subjective/Objective:    Admitted with COPD. Pt from home, lives with family. Pt has insurance with drug coverage. He has neb machine and inhalers he uses as instructed. He drives himself to appointments. He communicates no needs or concerns about DC needs. Acutely on oxygen/may need Bipap again. Patient is on the [facility] registry, but not active currently, will send referral to [facility] for COPD management.                Action/Plan: [facility] referral. CM following for ongoing needs.   Expected Discharge Date:    unk              Expected Discharge Plan:     In-House Referral:     Discharge planning Services  CM Consult  Post Acute Care Choice:    Choice offered to:     DME Arranged:    DME Agency:     HH Arranged:    HH Agency:     Status of Service:  In process, will continue to follow  If discussed at Long Length of Stay Meetings, dates discussed:    Additional Comments:  Faatima Tench, [name], RN 06/27/2018, 11:15 AM

## 2018-06-27 NOTE — Evaluation (Signed)
Physical Therapy Evaluation Patient Details Name: Bryan [name] MRN: [id number] DOB: 04-10-1956 Today's Date: 06/27/2018   History of Present Illness  Patient is a 61 year old male admitted 06/26/2018 with diagnosis acute exacerbation of COPD. PMH: COPD, GERD, HTN, DM type 2, chronic respiratory failure w/ hypoxia, hypokalemia, hypomagnesemia, anemia, PBH, peptic ulcer disease, chronic back pain, history of TIA, recent admission for HCAP, stage III CKD.    Clinical Impression  Patient able to ambulate 600 feet on level surface without assistive device and min guard for safety only. Patient evaluated by Physical Therapy with no further acute PT needs identified. All education has been completed and the patient has no further questions. Patient would benefit from ambulation with nursing to maintain current functional levels while in the hospital. See below for any follow-up Physical Therapy or equipment needs. PT is signing off. Thank you for this referral.     Follow Up Recommendations No PT follow up    Equipment Recommendations  None recommended by PT    Recommendations for Other Services       Precautions / Restrictions Precautions Precautions: None Restrictions Weight Bearing Restrictions: No      Mobility  Bed Mobility Overal bed mobility: Independent                Transfers Overall transfer level: Independent                  Ambulation/Gait Ambulation/Gait assistance: Modified independent (Device/Increase time) Gait Distance (Feet): 600 Feet Assistive device: None Gait Pattern/deviations: WFL(Within Functional Limits) Gait velocity: normal   General Gait Details: demonstrates good return for ambulation on level surfaces without loss of balance  Stairs            Wheelchair Mobility    Modified Rankin (Stroke Patients Only)       Balance Overall balance assessment: No apparent balance deficits (not formally assessed)                                            Pertinent Vitals/Pain Pain Assessment: No/denies pain    Home Living Family/patient expects to be discharged to:: Private residence Living Arrangements: Other relatives(son) Available Help at Discharge: Family Type of Home: House Home Access: Stairs to enter Entrance Stairs-Rails: None Technical brewer of Steps: 1 Home Layout: One level Home Equipment: Cane - single point      Prior Function Level of Independence: Independent with assistive device(s)         Comments: community ambulator with [facility] PRN     Hand Dominance   Dominant Hand: Right    Extremity/Trunk Assessment   Upper Extremity Assessment Upper Extremity Assessment: Overall WFL for tasks assessed    Lower Extremity Assessment Lower Extremity Assessment: Overall WFL for tasks assessed    Cervical / Trunk Assessment Cervical / Trunk Assessment: Normal  Communication   Communication: No difficulties  Cognition Arousal/Alertness: Awake/alert Behavior During Therapy: WFL for tasks assessed/performed Overall Cognitive Status: Within Functional Limits for tasks assessed                                        General Comments      Exercises     Assessment/Plan    PT Assessment Patent does not need any further PT  services  PT Problem List         PT Treatment Interventions      PT Goals (Current goals can be found in the Care Plan section)  Acute Rehab PT Goals Patient Stated Goal: return home PT Goal Formulation: With patient Time For Goal Achievement: 07/04/18 Potential to Achieve Goals: Good    Frequency     Barriers to discharge        Co-evaluation               AM-PAC PT "6 Clicks" Daily Activity  Outcome Measure Difficulty turning over in bed (including adjusting bedclothes, sheets and blankets)?: None Difficulty moving from lying on back to sitting on the side of the bed? : None Difficulty sitting down on  and standing up from a chair with arms (e.g., wheelchair, bedside commode, etc,.)?: None Help needed moving to and from a bed to chair (including a wheelchair)?: None Help needed walking in hospital room?: None Help needed climbing 3-5 steps with a railing? : None 6 Click Score: 24    End of Session   Activity Tolerance: Patient tolerated treatment well(Patient limited by O2 desaturation) Patient left: in bed;with call bell/phone within reach(seated at bedside) Nurse Communication: Mobility status PT Visit Diagnosis: Unsteadiness on feet (R26.81);Other abnormalities of gait and mobility (R26.89);Muscle weakness (generalized) (M62.81)    Time: 8719-9412 PT Time Calculation (min) (ACUTE ONLY): 23 min   Charges:   PT Evaluation $PT Eval Low Complexity: 1 Low PT Treatments $Gait Training: 8-22 mins        Floria Raveling. Hartnett-Rands, MS, PT Per [place] [phone] 06/27/2018, 11:59 AM

## 2018-06-27 NOTE — Progress Notes (Signed)
PROGRESS NOTE    Bryan [name]  OEV:[id number]  DOB: Apr 30, 1956  DOA: 06/26/2018 PCP: Rosita Fire, MD   Brief Admission Hx: Bryan [name] is a 62 y.o. male with medical history significant of GERD, PUD, osteoarthritis, asthma, chronic back pain, COPD, type 2 diabetes, history of headaches, history of TIA, history of recent admission for HCAP who is coming to the emergency department with complaints of progressively worse dyspnea since yesterday evening associated with pleuritic chest pain, wheezing, fatigue, but denies productive or nonproductive cough.  No fever, chills, hemoptysis, sore throat or rhinorrhea.  Admitted with acute COPD exacerbation.    MDM/Assessment & Plan:   1. Acute COPD exacerbation - he remains in SDU, He was able to be taken off bipap, however, he remains tachypneic and may have to go on bipap again, therefore will leave him in the SDU for now.  Continue IV steroids, nebs, antibiotics and other supportive care treatments.  If he does not require further bipap could move him to the medical floor.  Pt remains full code.  2. GERD - He is on protonix for GI protection.  3. Essential Hypertension - resumed home amlodipine and monitoring.  4. BPH - resumed home tamsulosin daily.  5. Type 2 Diabetes mellitus with hyperlipidemia - A1c 6.9%.  Continue supplemental sliding scale and CBG monitoring.   6. Hypokalemia - repleting.  Also repleting magnesium, follow levels.  7. CKD stage 3 - Monitoring.  8. Hypomagnesemia - replacing IV, recheck in AM.   DVT prophylaxis: heparin  Code Status: Full  Family Communication: son present  Disposition Plan: inpatient care with IV steroids, antibiotics, bipap and electrolyte replacement   Antimicrobials:  Doxycycline 10/16   Subjective: Pt says he still feels short of breath but reports that he is improving.  He feels better off BiPAP now.  He has no chest pain.  He has no fever.  Objective: Vitals:   06/27/18 0600 06/27/18  0653 06/27/18 0746 06/27/18 0831  BP: 118/71     Pulse: 73 81 78   Resp: (!) 27 (!) 28 (!) 28   Temp:   98.2 F (36.8 C)   TempSrc:   Oral   SpO2: 99% 99% 95% 95%  Weight:      Height:        Intake/Output Summary (Last 24 hours) at 06/27/2018 0913 Last data filed at 06/27/2018 0451 Gross per 24 hour  Intake 835.57 ml  Output 400 ml  Net 435.57 ml   Filed Weights   06/26/18 1830 06/26/18 2200 06/27/18 0413  Weight: 55.9 kg 54.7 kg 55.9 kg   REVIEW OF SYSTEMS  As per history otherwise all reviewed and reported negative  Exam:  General exam: Awake, alert, cooperative, no apparent distress. Respiratory system: Bilateral breath sounds with diffuse expiratory wheezes heard.  No increased work of breathing. Cardiovascular system: S1 & S2 heard, RRR. No JVD, murmurs, gallops, clicks or pedal edema. Gastrointestinal system: Abdomen is nondistended, soft and nontender. Normal bowel sounds heard. Central nervous system: Alert and oriented. No focal neurological deficits. Extremities: no CCE.  Data Reviewed: Basic Metabolic Panel: Recent Labs  Lab 06/26/18 1842  NA 140  K 3.4*  CL 106  CO2 26  GLUCOSE 127*  BUN 15  CREATININE 1.54*  CALCIUM 8.8*  MG 1.4*  PHOS 3.6   Liver Function Tests: Recent Labs  Lab 06/26/18 1842  AST 18  ALT 11  ALKPHOS 90  BILITOT 0.5  PROT 7.5  ALBUMIN 3.8  No results for input(s): LIPASE, AMYLASE in the last 168 hours. No results for input(s): AMMONIA in the last 168 hours. CBC: Recent Labs  Lab 06/26/18 1842  WBC 7.0  NEUTROABS 3.5  HGB 12.8*  HCT 41.8  MCV 86.2  PLT 260   Cardiac Enzymes: Recent Labs  Lab 06/26/18 1842  TROPONINI <0.03   CBG (last 3)  Recent Labs    06/26/18 1852 06/27/18 0738  GLUCAP 117* 142*   Recent Results (from the past 240 hour(s))  MRSA PCR Screening     Status: None   Collection Time: 06/26/18 10:35 PM  Result Value Ref Range Status   MRSA by PCR NEGATIVE NEGATIVE Final     Comment:        The GeneXpert MRSA Assay (FDA approved for NASAL specimens only), is one component of a comprehensive MRSA colonization surveillance program. It is not intended to diagnose MRSA infection nor to guide or monitor treatment for MRSA infections. Performed at [facility], [address], [place], [place] [address]     Studies: Dg Chest 2 View  Result Date: 06/26/2018 CLINICAL DATA:  Central chest pain, cough and congestion EXAM: CHEST - 2 VIEW COMPARISON:  CT 06/04/2018, radiograph 06/04/2018 FINDINGS: Postsurgical changes in the cervical spine. No focal opacity or pleural effusion. Stable cardiomediastinal silhouette. No pneumothorax. IMPRESSION: No active cardiopulmonary disease. Electronically Signed   By: Donavan Foil M.D.   On: 06/26/2018 19:12   Scheduled Meds: . amLODipine  5 mg Oral Daily  . chlorhexidine  15 mL Mouth Rinse BID  . doxycycline  100 mg Oral Q12H  . guaiFENesin  600 mg Oral BID  . heparin  5,000 Units Subcutaneous Q8H  . insulin aspart  0-15 Units Subcutaneous TID WC  . ipratropium-albuterol  3 mL Nebulization Q6H  . mouth rinse  15 mL Mouth Rinse q12n4p  . methylPREDNISolone (SOLU-MEDROL) injection  40 mg Intravenous Q6H  . pantoprazole  40 mg Oral BID AC  . tamsulosin  0.4 mg Oral Daily   Continuous Infusions: . sodium chloride Stopped (06/27/18 0050)    Principal Problem:   COPD with acute exacerbation (HCC) Active Problems:   GERD   Hypertension   Type 2 diabetes mellitus with hyperlipidemia (HCC)   Chronic respiratory failure with hypoxia (HCC)   Hypokalemia   Hypomagnesemia   Anemia   BPH (benign prostatic hyperplasia)  Critical care Time spent: 31 minutes   Irwin Brakeman, MD, FAAFP [facility] Pager [phone]  If 7PM-7AM, please contact night-coverage [email or web address] Password [email or web address] 06/27/2018, 9:13 AM    LOS: 0 days

## 2018-06-27 NOTE — Progress Notes (Signed)
Patient refused incentive spirometry, patient stated that 'he has several at home that he do not use'. He do not want another one.

## 2018-06-27 NOTE — Progress Notes (Signed)
OT Cancellation Note  Patient Details Name: Bryan [name] MRN: [id number] DOB: June 21, 1956   Cancelled Treatment:    Reason Eval/Treat Not Completed: OT screened, no needs identified, will sign off  Pt presents supine in bed and agreeable to see OT. Pt appears to be back to baseline and independent with ADLs. He will not need OT services at this time. Thank you for this referral.   Guadelupe Sabin, OTR/L  [phone] 06/27/2018, 7:39 AM

## 2018-06-27 NOTE — Progress Notes (Signed)
Pt asleep and resting BIPAP not needed at this time RT will continue to monitor

## 2018-06-27 NOTE — Care Management Obs Status (Signed)
[facility] NOTIFICATION   Patient Details  Name: Bryan [name] MRN: [id number] Date of Birth: 1955-12-16   Medicare Observation Status Notification Given:  Yes    Mackenzye Mackel, [name], RN 06/27/2018, 7:52 AM

## 2018-06-28 DIAGNOSIS — K210 Gastro-esophageal reflux disease with esophagitis: Secondary | ICD-10-CM

## 2018-06-28 DIAGNOSIS — E1169 Type 2 diabetes mellitus with other specified complication: Secondary | ICD-10-CM

## 2018-06-28 DIAGNOSIS — E785 Hyperlipidemia, unspecified: Secondary | ICD-10-CM

## 2018-06-28 DIAGNOSIS — E876 Hypokalemia: Secondary | ICD-10-CM

## 2018-06-28 DIAGNOSIS — N400 Enlarged prostate without lower urinary tract symptoms: Secondary | ICD-10-CM

## 2018-06-28 LAB — COMPREHENSIVE METABOLIC PANEL
ALBUMIN: 3.3 g/dL — AB (ref 3.5–5.0)
ALK PHOS: 84 U/L (ref 38–126)
ALT: 11 U/L (ref 0–44)
ANION GAP: 6 (ref 5–15)
AST: 17 U/L (ref 15–41)
BILIRUBIN TOTAL: 0.4 mg/dL (ref 0.3–1.2)
BUN: 22 mg/dL (ref 8–23)
CALCIUM: 9 mg/dL (ref 8.9–10.3)
CO2: 24 mmol/L (ref 22–32)
CREATININE: 1.38 mg/dL — AB (ref 0.61–1.24)
Chloride: 108 mmol/L (ref 98–111)
GFR calc Af Amer: >60 mL/min (ref >60)
GFR calc non Af Amer: 53 mL/min — ABNORMAL LOW (ref >60)
GLUCOSE: 141 mg/dL — AB (ref 70–99)
Potassium: 4.7 mmol/L (ref 3.5–5.1)
Sodium: 138 mmol/L (ref 135–145)
TOTAL PROTEIN: 6.8 g/dL (ref 6.5–8.1)

## 2018-06-28 LAB — HIV ANTIBODY (ROUTINE TESTING W REFLEX): HIV Screen 4th Generation wRfx: NONREACTIVE

## 2018-06-28 LAB — MAGNESIUM: Magnesium: 2.3 mg/dL (ref 1.7–2.4)

## 2018-06-28 LAB — GLUCOSE, CAPILLARY
GLUCOSE-CAPILLARY: 129 mg/dL — AB (ref 70–99)
Glucose-Capillary: 115 mg/dL — ABNORMAL HIGH (ref 70–99)
Glucose-Capillary: 107 mg/dL — ABNORMAL HIGH (ref 70–99)
Glucose-Capillary: 99 mg/dL (ref 70–99)

## 2018-06-28 MED ORDER — IPRATROPIUM-ALBUTEROL 0.5-2.5 (3) MG/3ML IN SOLN
3.0000 mL | Freq: Three times a day (TID) | RESPIRATORY_TRACT | Status: DC
  Administered 2018-06-29: 3 mL via RESPIRATORY_TRACT
  Filled 2018-06-28: qty 3

## 2018-06-28 MED ORDER — GLUCERNA SHAKE PO LIQD
237.0000 mL | Freq: Two times a day (BID) | ORAL | Status: DC
  Administered 2018-06-28: 237 mL via ORAL

## 2018-06-28 NOTE — Progress Notes (Signed)
Nutrition Brief Note  Nutrition Screen performed as part of COPD gold protocol.   Wt Readings from Last 15 Encounters:  06/28/18 54.5 kg  06/04/18 55.9 kg  05/08/18 54.4 kg  04/27/18 53.2 kg  04/14/18 53.2 kg  03/19/18 52.5 kg  03/12/18 52.5 kg  02/01/18 54.1 kg  08/17/17 53.1 kg  08/15/17 53.1 kg  08/09/17 52.6 kg  07/22/17 52.2 kg  07/16/17 51.3 kg  07/10/17 52.8 kg  05/19/17 54.1 kg   Body mass index is 21.28 kg/m. Patient meets criteria for healthy weight for ht based on current BMI.   Pt is thinner, but reports this is normal for him. His UBW is 115 lbs. Chart confirms pt has been between 115-125 lbs for >5 years.   He reports a good appetite and denies his COPD having any negative affect on intake.   Since admitted, he says he has eaten atleast 50% of his meals. He voices no nutritional concerns. No n/v/c/d. He was fine with ordering Glucerna just for added benefit while here.    Per progression meeting, pt likely to d/c tomorrow morning.   No nutrition concerns Identified at this time.  If nutrition issues arise, please consult RD.   Burtis Junes RD, LDN, CNSC Clinical Nutrition Available Tues-Sat via Pager: [phone] 06/28/2018 1:14 PM

## 2018-06-28 NOTE — Progress Notes (Signed)
PROGRESS NOTE  Bryan [name]  KCL:[id number]  DOB: 1956/08/17  DOA: 06/26/2018 PCP: Rosita Fire, MD   Brief Admission Hx: Bryan [name] is a 62 y.o. male with medical history significant of GERD, PUD, osteoarthritis, asthma, chronic back pain, COPD, type 2 diabetes, history of headaches, history of TIA, history of recent admission for HCAP who is coming to the emergency department with complaints of progressively worse dyspnea since yesterday evening associated with pleuritic chest pain, wheezing, fatigue, but denies productive or nonproductive cough.  No fever, chills, hemoptysis, sore throat or rhinorrhea.  Admitted with acute COPD exacerbation.    MDM/Assessment & Plan:   1. Acute COPD exacerbation - he in improving.  He did not require further bipap.  Continue IV steroids, nebs, antibiotics and other supportive care treatments.  Because he did not require further bipap will move him to the medical floor.  2. GERD - He is on protonix for GI protection.  3. Essential Hypertension - resumed home amlodipine and monitoring.  4. BPH - resumed home tamsulosin daily.  5. Type 2 Diabetes mellitus with hyperlipidemia - A1c 6.9%.  Continue supplemental sliding scale and CBG monitoring.   6. Hypokalemia - repleting.  Also repleting magnesium, follow levels.  7. CKD stage 3 - Monitoring.  8. Hypomagnesemia -repleted.   DVT prophylaxis: heparin  Code Status: Full  Family Communication: son present  Disposition Plan: inpatient care with IV steroids, antibiotics, bipap and electrolyte replacement  Antimicrobials:  Doxycycline 10/16   Subjective: Pt says that he is improving.  He did not have to use bipap overnight.    Objective: Vitals:   06/28/18 0507 06/28/18 0607 06/28/18 0736 06/28/18 1107  BP:  134/75    Pulse:  (!) 105 85 82  Resp:  (!) 28 (!) 21 (!) 33  Temp:   99.4 F (37.4 C) 98.1 F (36.7 C)  TempSrc:   Oral Oral  SpO2:  94% 94% 95%  Weight: 54.5 kg     Height:         Intake/Output Summary (Last 24 hours) at 06/28/2018 1124 Last data filed at 06/28/2018 4944 Gross per 24 hour  Intake 440 ml  Output 2100 ml  Net -1660 ml   Filed Weights   06/26/18 2200 06/27/18 0413 06/28/18 0507  Weight: 54.7 kg 55.9 kg 54.5 kg   REVIEW OF SYSTEMS  As per history otherwise all reviewed and reported negative  Exam:  General exam: Awake, alert, cooperative, no apparent distress. Respiratory system: Bilateral breath sounds with rare expiratory wheezes heard.  No increased work of breathing. Cardiovascular system: S1 & S2 heard, RRR. No JVD, murmurs, gallops, clicks or pedal edema. Gastrointestinal system: Abdomen is nondistended, soft and nontender. Normal bowel sounds heard. Central nervous system: Alert and oriented. No focal neurological deficits. Extremities: no CCE.  Data Reviewed: Basic Metabolic Panel: Recent Labs  Lab 06/26/18 1842 06/28/18 0411  NA 140 138  K 3.4* 4.7  CL 106 108  CO2 26 24  GLUCOSE 127* 141*  BUN 15 22  CREATININE 1.54* 1.38*  CALCIUM 8.8* 9.0  MG 1.4* 2.3  PHOS 3.6  --    Liver Function Tests: Recent Labs  Lab 06/26/18 1842 06/28/18 0411  AST 18 17  ALT 11 11  ALKPHOS 90 84  BILITOT 0.5 0.4  PROT 7.5 6.8  ALBUMIN 3.8 3.3*   No results for input(s): LIPASE, AMYLASE in the last 168 hours. No results for input(s): AMMONIA in the last 168 hours. CBC:  Recent Labs  Lab 06/26/18 1842  WBC 7.0  NEUTROABS 3.5  HGB 12.8*  HCT 41.8  MCV 86.2  PLT 260   Cardiac Enzymes: Recent Labs  Lab 06/26/18 1842  TROPONINI <0.03   CBG (last 3)  Recent Labs    06/27/18 1629 06/27/18 2110 06/28/18 0736  GLUCAP 188* 138* 115*   Recent Results (from the past 240 hour(s))  MRSA PCR Screening     Status: None   Collection Time: 06/26/18 10:35 PM  Result Value Ref Range Status   MRSA by PCR NEGATIVE NEGATIVE Final    Comment:        The GeneXpert MRSA Assay (FDA approved for NASAL specimens only), is one  component of a comprehensive MRSA colonization surveillance program. It is not intended to diagnose MRSA infection nor to guide or monitor treatment for MRSA infections. Performed at [facility], [address], [place], [place] [address]     Studies: Dg Chest 2 View  Result Date: 06/26/2018 CLINICAL DATA:  Central chest pain, cough and congestion EXAM: CHEST - 2 VIEW COMPARISON:  CT 06/04/2018, radiograph 06/04/2018 FINDINGS: Postsurgical changes in the cervical spine. No focal opacity or pleural effusion. Stable cardiomediastinal silhouette. No pneumothorax. IMPRESSION: No active cardiopulmonary disease. Electronically Signed   By: Donavan Foil M.D.   On: 06/26/2018 19:12   Scheduled Meds: . amLODipine  5 mg Oral Daily  . chlorhexidine  15 mL Mouth Rinse BID  . doxycycline  100 mg Oral Q12H  . guaiFENesin  600 mg Oral BID  . heparin  5,000 Units Subcutaneous Q8H  . insulin aspart  0-15 Units Subcutaneous TID WC  . ipratropium-albuterol  3 mL Nebulization Q6H  . mouth rinse  15 mL Mouth Rinse q12n4p  . pantoprazole  40 mg Oral BID AC  . tamsulosin  0.4 mg Oral Daily   Continuous Infusions: . sodium chloride Stopped (06/27/18 0050)    Principal Problem:   COPD with acute exacerbation (HCC) Active Problems:   GERD   Hypertension   Type 2 diabetes mellitus with hyperlipidemia (HCC)   Chronic respiratory failure with hypoxia (HCC)   Hypokalemia   Hypomagnesemia   Anemia   BPH (benign prostatic hyperplasia)  Irwin Brakeman, MD, FAAFP [facility] Pager [phone]  If 7PM-7AM, please contact night-coverage [email or web address] Password [email or web address] 06/28/2018, 11:24 AM    LOS: 1 day

## 2018-06-29 DIAGNOSIS — I10 Essential (primary) hypertension: Secondary | ICD-10-CM

## 2018-06-29 LAB — GLUCOSE, CAPILLARY: GLUCOSE-CAPILLARY: 152 mg/dL — AB (ref 70–99)

## 2018-06-29 MED ORDER — DOXYCYCLINE HYCLATE 100 MG PO TABS: 100.0000 mg | ORAL_TABLET | Freq: Two times a day (BID) | ORAL | 0 refills | Status: DC

## 2018-06-29 MED ORDER — GUAIFENESIN ER 600 MG PO TB12: 600.0000 mg | ORAL_TABLET | Freq: Two times a day (BID) | ORAL | 0 refills | Status: DC

## 2018-06-29 MED ORDER — PREDNISONE 20 MG PO TABS
40.0000 mg | ORAL_TABLET | Freq: Every day | ORAL | Status: DC
  Administered 2018-06-29: 40 mg via ORAL
  Filled 2018-06-29: qty 2

## 2018-06-29 MED ORDER — PREDNISONE 10 MG PO TABS: ORAL_TABLET | ORAL | 0 refills | Status: DC

## 2018-06-29 MED ORDER — FLUTICASONE-UMECLIDIN-VILANT 100-62.5-25 MCG/INH IN AEPB: 1.0000 | INHALATION_SPRAY | Freq: Every day | RESPIRATORY_TRACT | 0 refills | Status: DC

## 2018-06-29 MED ORDER — ALBUTEROL SULFATE (2.5 MG/3ML) 0.083% IN NEBU: 2.5000 mg | INHALATION_SOLUTION | RESPIRATORY_TRACT | 0 refills | Status: DC | PRN

## 2018-06-29 NOTE — Discharge Summary (Signed)
Physician Discharge Summary  Bryan [name] MBW:[id number] DOB: Nov 10, 1955 DOA: 06/26/2018  PCP: Rosita Fire, MD  Admit date: 06/26/2018 Discharge date: 06/29/2018  Admitted From: Home Disposition: Home  Recommendations for Outpatient Follow-up:  1. Follow up with PCP in 1-2 weeks  Home Health: Equipment/Devices:  Discharge Condition: stable CODE STATUS: full code Diet recommendation: heart healthy, carb modified  Brief/Interim Summary: 62 y/o male with history of COPD, DM, HTN, presented to the hospital with shortness of breath. He was found to have copd exacerbation and was initially placed on bipap. He was admitted to stepdown and started intravenous steroids, bronchodilators and antibiotics. Patient quickly improved and was able to be weaned off bipap and on to nasal cannula oxygen. This was eventually weaned off and he is now on room air.  Patient was ambulated without any shortness of breath.  His oxygen saturations occasionally dropped down to 88, but quickly returned to the 90s with deep breaths.  He was offered oxygen at home, but has refused.  He will be placed on a prednisone taper.  He already has nebulizer treatment as well as other respiratory medications at home.  The remainder of his medical problems are stable.  Discharge Diagnoses:  Principal Problem:   COPD with acute exacerbation ([place]) Active Problems:   GERD   Hypertension   Type 2 diabetes mellitus with hyperlipidemia (HCC)   Chronic respiratory failure with hypoxia (HCC)   Hypokalemia   Hypomagnesemia   Anemia   BPH (benign prostatic hyperplasia)    Discharge Instructions  Discharge Instructions    Diet - low sodium heart healthy   Complete by:  As directed    Increase activity slowly   Complete by:  As directed      Allergies as of 06/29/2018   No Known Allergies     Medication List    TAKE these medications   albuterol 108 (90 Base) MCG/ACT inhaler Commonly known as:  PROVENTIL  HFA;VENTOLIN HFA Inhale 1-2 puffs every 4 (four) hours as needed into the lungs for wheezing or shortness of breath. Do not use with nebulizer What changed:  how much to take   albuterol (2.5 MG/3ML) 0.083% nebulizer solution Commonly known as:  PROVENTIL Take 3 mLs (2.5 mg total) by nebulization every 4 (four) hours as needed for wheezing or shortness of breath. What changed:  Another medication with the same name was changed. Make sure you understand how and when to take each.   amLODipine 5 MG tablet Commonly known as:  NORVASC Take 1 tablet (5 mg total) by mouth daily.   atorvastatin 40 MG tablet Commonly known as:  LIPITOR Take 1 tablet (40 mg total) daily by mouth.   doxycycline 100 MG tablet Commonly known as:  VIBRA-TABS Take 1 tablet (100 mg total) by mouth every 12 (twelve) hours.   fluticasone 50 MCG/ACT nasal spray Commonly known as:  FLONASE Place 1 spray into both nostrils daily.   Fluticasone-Umeclidin-Vilant 100-62.5-25 MCG/INH Aepb Inhale 1 puff into the lungs daily.   gabapentin 300 MG capsule Commonly known as:  NEURONTIN Take 1 capsule (300 mg total) by mouth 2 (two) times daily. Pt. Says he is taking twice daily.   guaiFENesin 600 MG 12 hr tablet Commonly known as:  MUCINEX Take 1 tablet (600 mg total) by mouth 2 (two) times daily.   linaclotide 72 MCG capsule Commonly known as:  LINZESS Take 1 capsule (72 mcg total) by mouth daily before breakfast.   metFORMIN 500 MG tablet Commonly  known as:  GLUCOPHAGE Take 1 tablet (500 mg total) daily with breakfast by mouth.   pantoprazole 40 MG tablet Commonly known as:  PROTONIX Take 1 tablet (40 mg total) by mouth 2 (two) times daily before a meal.   predniSONE 10 MG tablet Commonly known as:  DELTASONE Take 73m po daily for 2 days then 329mdaily for 2 days then 2090maily for 2 days then 10m52mily for 2 days then stop What changed:    medication strength  additional instructions   tamsulosin  0.4 MG Caps capsule Commonly known as:  FLOMAX Take 1 capsule by mouth daily.   traMADol 50 MG tablet Commonly known as:  ULTRAM Take 1 tablet (50 mg total) by mouth every 6 (six) hours as needed.       No Known Allergies  Consultations:     Procedures/Studies: Dg Chest 2 View  Result Date: 06/26/2018 CLINICAL DATA:  Central chest pain, cough and congestion EXAM: CHEST - 2 VIEW COMPARISON:  CT 06/04/2018, radiograph 06/04/2018 FINDINGS: Postsurgical changes in the cervical spine. No focal opacity or pleural effusion. Stable cardiomediastinal silhouette. No pneumothorax. IMPRESSION: No active cardiopulmonary disease. Electronically Signed   By: Kim Donavan Foil.   On: 06/26/2018 19:12   Ct Angio Chest Pe W And/or Wo Contrast  Result Date: 06/04/2018 CLINICAL DATA:  Chest pain EXAM: CT ANGIOGRAPHY CHEST WITH CONTRAST TECHNIQUE: Multidetector CT imaging of the chest was performed using the standard protocol during bolus administration of intravenous contrast. Multiplanar CT image reconstructions and MIPs were obtained to evaluate the vascular anatomy. CONTRAST:  100 mL ISOVUE-370 IOPAMIDOL (ISOVUE-370) INJECTION 76% COMPARISON:  Chest CT April 14, 2018 and chest radiograph June 04, 2018 FINDINGS: Cardiovascular: There is no demonstrable pulmonary embolus. There is no appreciable thoracic aortic aneurysm or dissection. Visualized great vessels appear unremarkable except for mild calcification in the proximal right subclavian artery. There are occasional foci of coronary artery calcification. There is no pericardial effusion or pericardial thickening evident. Mediastinum/Nodes: Visualized thyroid appears normal. There is no appreciable thoracic adenopathy. There are subcentimeter axillary lymph nodes which do not meet size criteria for pathologic significance. No esophageal lesions are demonstrable. Lungs/Pleura: There is mild scarring in each apical region. There is underlying  centrilobular emphysematous change. There is an area of consolidation in the apical segment of the left upper lobe. There are areas of atelectasis in each lower lobe and in the inferior lingula. There is no evident pleural effusion or pleural thickening. Upper Abdomen: There is equivocal hepatic steatosis. Visualized upper abdominal structures otherwise appear unremarkable. Musculoskeletal: Postoperative changes noted in the lower cervical spine region. There are no blastic or lytic bone lesions. There is no evident chest wall lesion. Review of the MIP images confirms the above findings. IMPRESSION: 1. No demonstrable pulmonary embolus. No thoracic aortic aneurysm or dissection. 2. Infiltrate felt to represent pneumonia in the apical segment of the left upper lobe. Areas of atelectatic change in the inferior lingula and lung bases. Advise repeat study in 4-6 weeks after treatment for pneumonia to exclude possible underlying nodular lesion in the left apical region. 3.  No evident thoracic adenopathy. 4.  Equivocal hepatic steatosis. 5.  Occasional foci of coronary artery calcification. Electronically Signed   By: WillLowella Grip M.D.   On: 06/04/2018 13:47   Dg Chest Portable 1 View  Result Date: 06/04/2018 CLINICAL DATA:  Chest pain EXAM: PORTABLE CHEST 1 VIEW COMPARISON:  CT chest 04/14/2018 FINDINGS: The heart size and mediastinal  contours are within normal limits. Both lungs are clear. The visualized skeletal structures are unremarkable. IMPRESSION: No active disease. Electronically Signed   By: Kathreen Devoid   On: 06/04/2018 12:25       Subjective: Feeling better today.  Shortness of breath resolved.  Patient seen sitting out of bed on room air.  Discharge Exam: Vitals:   06/29/18 0348 06/29/18 0631 06/29/18 0829 06/29/18 1000  BP:  128/77    Pulse:  73  92  Resp:      Temp: 98.1 F (36.7 C) 98 F (36.7 C)    TempSrc: Oral Oral    SpO2:   97% 92%  Weight:  55 kg    Height:         General: Pt is alert, awake, not in acute distress Cardiovascular: RRR, S1/S2 +, no rubs, no gallops Respiratory: CTA bilaterally, no wheezing, no rhonchi Abdominal: Soft, NT, ND, bowel sounds + Extremities: no edema, no cyanosis    The results of significant diagnostics from this hospitalization (including imaging, microbiology, ancillary and laboratory) are listed below for reference.     Microbiology: Recent Results (from the past 240 hour(s))  MRSA PCR Screening     Status: None   Collection Time: 06/26/18 10:35 PM  Result Value Ref Range Status   MRSA by PCR NEGATIVE NEGATIVE Final    Comment:        The GeneXpert MRSA Assay (FDA approved for NASAL specimens only), is one component of a comprehensive MRSA colonization surveillance program. It is not intended to diagnose MRSA infection nor to guide or monitor treatment for MRSA infections. Performed at [facility], [address], [place], [place] [address]      Labs: BNP (last 3 results) Recent Labs    07/14/17 1655 04/14/18 1345 06/26/18 1842  BNP 101.0* 36.0 50.5   Basic Metabolic Panel: Recent Labs  Lab 06/26/18 1842 06/28/18 0411  NA 140 138  K 3.4* 4.7  CL 106 108  CO2 26 24  GLUCOSE 127* 141*  BUN 15 22  CREATININE 1.54* 1.38*  CALCIUM 8.8* 9.0  MG 1.4* 2.3  PHOS 3.6  --    Liver Function Tests: Recent Labs  Lab 06/26/18 1842 06/28/18 0411  AST 18 17  ALT 11 11  ALKPHOS 90 84  BILITOT 0.5 0.4  PROT 7.5 6.8  ALBUMIN 3.8 3.3*   No results for input(s): LIPASE, AMYLASE in the last 168 hours. No results for input(s): AMMONIA in the last 168 hours. CBC: Recent Labs  Lab 06/26/18 1842  WBC 7.0  NEUTROABS 3.5  HGB 12.8*  HCT 41.8  MCV 86.2  PLT 260   Cardiac Enzymes: Recent Labs  Lab 06/26/18 1842  TROPONINI <0.03   BNP: Invalid input(s): POCBNP CBG: Recent Labs  Lab 06/28/18 0736 06/28/18 1106 06/28/18 1601 06/28/18 2307 06/29/18 0851  GLUCAP 115* 129* 107* 99  152*   D-Dimer No results for input(s): DDIMER in the last 72 hours. Hgb A1c No results for input(s): HGBA1C in the last 72 hours. Lipid Profile No results for input(s): CHOL, HDL, LDLCALC, TRIG, CHOLHDL, LDLDIRECT in the last 72 hours. Thyroid function studies No results for input(s): TSH, T4TOTAL, T3FREE, THYROIDAB in the last 72 hours.  Invalid input(s): FREET3 Anemia work up Recent Labs    06/27/18 0410 06/27/18 0411  VITAMINB12  --  487  FOLATE  --  6.2  FERRITIN  --  8*  TIBC  --  386  IRON  --  33*  RETICCTPCT 0.6  --    Urinalysis    Component Value Date/Time   COLORURINE YELLOW 06/04/2018 [place] 06/04/2018 1231   LABSPEC 1.012 06/04/2018 1231   PHURINE 6.0 06/04/2018 1231   GLUCOSEU NEGATIVE 06/04/2018 1231   HGBUR NEGATIVE 06/04/2018 1231   BILIRUBINUR NEGATIVE 06/04/2018 1231   KETONESUR NEGATIVE 06/04/2018 1231   PROTEINUR NEGATIVE 06/04/2018 1231   UROBILINOGEN 0.2 07/08/2015 1314   NITRITE NEGATIVE 06/04/2018 1231   LEUKOCYTESUR NEGATIVE 06/04/2018 1231   Sepsis Labs Invalid input(s): PROCALCITONIN,  WBC,  LACTICIDVEN Microbiology Recent Results (from the past 240 hour(s))  MRSA PCR Screening     Status: None   Collection Time: 06/26/18 10:35 PM  Result Value Ref Range Status   MRSA by PCR NEGATIVE NEGATIVE Final    Comment:        The GeneXpert MRSA Assay (FDA approved for NASAL specimens only), is one component of a comprehensive MRSA colonization surveillance program. It is not intended to diagnose MRSA infection nor to guide or monitor treatment for MRSA infections. Performed at [facility], [address] [place],  [address]      Time coordinating discharge: 30mns  SIGNED:   JKathie Dike MD  [facility] 06/29/2018, 11:58 AM Pager   If 7PM-7AM, please contact night-coverage [email or web address] Password [email or web address]

## 2018-06-29 NOTE — Care Management Note (Signed)
Case Management Note  Patient Details  Name: Bryan [name] MRN: [id number] Date of Birth: 10/17/1955  Subjective/Objective:                    Action/Plan: Patient walked with PT, sats ranging from 88-92 while ambulating. Discussed with patient that he qualifies for home oxygen. Patient declines. Reports his sat only dropped to 88 once and remains in mid 90's most of the time.     Expected Discharge Date:       06/29/2018           Expected Discharge Plan:  Home/Self Care  In-House Referral:     Discharge planning Services  CM Consult  Post Acute Care Choice:  NA Choice offered to:  NA  DME Arranged:    DME Agency:     HH Arranged:    HH Agency:     Status of Service:  Completed, signed off  If discussed at [employer] of Stay Meetings, dates discussed:    Additional Comments:  Bryan [name], Bryan Reading, RN 06/29/2018, 11:20 AM

## 2018-06-29 NOTE — Therapy (Signed)
SATURATION QUALIFICATIONS: (This note is used to comply with regulatory documentation for home oxygen)  Patient Saturations on Room Air supine at Rest = 90-93%  Patient Saturations on Room Air seated at Rest = 89-92%  Patient Saturations on Room Air standing at Rest = 89-93%  Patient Saturations on [employer] while Ambulating = 88-90%  Floria Raveling. Hartnett-Rands, MS, PT Per [place] [phone] 06/29/2018

## 2018-07-02 ENCOUNTER — Other Ambulatory Visit: Payer: Self-pay

## 2018-07-03 ENCOUNTER — Other Ambulatory Visit: Payer: Self-pay

## 2018-07-03 NOTE — Patient Outreach (Signed)
[place] [facility]) Care Management  07/02/2018  OWAIN ECKERMAN 11/21/1955 [id number]   Referral received from Forestine Na RN Case Manager for COPD management. Initial call successful. HIPAA verifiers obtained. Mr. Llerena stated that he was driving at the time of the call and requested that [facility] call back later.   PLAN Will follow up within 3-4 business days.   [place] [phone]

## 2018-07-04 NOTE — Patient Outreach (Signed)
[facility]) Care Management  07/03/2018  Bryan [name] 19-Jan-1956 [id number]    RNCM returned call as requested. No answer at listed number. Left HIPAA compliant voice message requesting a return call.   PLAN Will follow up in 3-4 business days.   [place] [phone]

## 2018-07-05 ENCOUNTER — Other Ambulatory Visit: Payer: Self-pay

## 2018-07-05 NOTE — Patient Outreach (Signed)
[place] [facility]) Care Management  07/05/2018   Bryan [name] 1956-07-09 [id number]    Outreach call placed due to receiving Red EMMI alert. Outreach successful. Bryan [name] reported that he was doing well and voiced no urgent concerns. He stated that he was not at home at the time of the call and requested a return call at a later time.    PLAN Will follow within 24 hours.   [place] [phone]

## 2018-07-06 ENCOUNTER — Other Ambulatory Visit: Payer: Self-pay

## 2018-07-06 NOTE — Patient Outreach (Signed)
[place] [facility]) Care Management  07/06/2018  Bryan [name] 1956/01/10 [id number]    Unsuccessful outreach. Left HIPAA compliant voice message requesting a return call.   PLAN Will send unsuccessful outreach letter. Will follow up in 3-4 business days.   [place] [phone]

## 2018-07-11 ENCOUNTER — Other Ambulatory Visit: Payer: Self-pay

## 2018-07-11 NOTE — Patient Outreach (Signed)
[name]) Care Management  07/11/2018  Bryan [name] 06-17-1956 [id number]    Medications Reviewed Today    Reviewed by Tim Lair, RN (Registered Nurse) on 06/26/18 at [place] List Status: Complete  Medication Order Taking? Sig Documenting Provider Last Dose Status Informant  albuterol (PROVENTIL HFA;VENTOLIN HFA) 108 (90 Base) MCG/ACT inhaler [id number] Yes Inhale 1-2 puffs every 4 (four) hours as needed into the lungs for wheezing or shortness of breath. Do not use with nebulizer  Patient taking differently:  Inhale 1 puff into the lungs every 4 (four) hours as needed for wheezing or shortness of breath. Do not use with nebulizer   Bryan Jones, MD unknown Active Pharmacy Records  albuterol (PROVENTIL) (2.5 MG/3ML) 0.083% nebulizer solution [id number] Yes Take 3 mLs (2.5 mg total) every 4 (four) hours as needed by nebulization for wheezing or shortness of breath. Bryan Jones, MD unknown Active Pharmacy Records  amLODipine [facility]) 5 MG tablet [id number] Yes Take 1 tablet (5 mg total) by mouth daily. Bryan Dike, MD 06/26/2018 Unknown time Active Pharmacy Records  atorvastatin (LIPITOR) 40 MG tablet [id number] No Take 1 tablet (40 mg total) daily by mouth.  Patient not taking:  Reported on 06/26/2018   Bryan Hamman, MD Not Taking Unknown time Consider Medication Status and Discontinue (Patient has not taken in last 30 days) Pharmacy Records           Med Note Bryan [name], [place] Jun 26, 2018  7:56 PM)    fluticasone [facility]) 50 MCG/ACT nasal spray [id number] Yes Place 1 spray into both nostrils daily. Barton Dubois, MD Past Month Unknown time Active Pharmacy Records  Fluticasone-Umeclidin-Vilant [facility] ELLIPTA) 100-62.5-25 MCG/INH AEPB [id number] No Inhale 1 puff into the lungs daily. [provider] Not Taking Unknown time Active Pharmacy Records  gabapentin (NEURONTIN) 300 MG capsule [id number] No Take 1 capsule (300 mg total) by  mouth 2 (two) times daily. Pt. Says he is taking twice daily.  Patient not taking:  Reported on 06/26/2018   Barton Dubois, MD Not Taking Unknown time Active Pharmacy Records  guaiFENesin [facility]) 600 MG 12 hr tablet [id number] No Take 1 tablet (600 mg total) by mouth 2 (two) times daily.  Patient not taking:  Reported on 06/26/2018   Barton Dubois, MD Not Taking Unknown time Consider Medication Status and Discontinue (One time medication) Pharmacy Records  linaclotide [facility]) 72 MCG capsule [id number] No Take 1 capsule (72 mcg total) by mouth daily before breakfast.  Patient not taking:  Reported on 06/26/2018   [name], NP Not Taking Unknown time Consider Medication Status and Discontinue (Prescription never filled) Pharmacy Records  metFORMIN (GLUCOPHAGE) 500 MG tablet [id number]  Take 1 tablet (500 mg total) daily with breakfast by mouth.  Patient not taking:  Reported on 06/04/2018   Bryan Hamman, MD  Consider Medication Status and Discontinue (Patient Preference) Pharmacy Records           Med Note Bryan [name], Bryan [name]   Tue Jun 26, 2018  7:56 PM)    pantoprazole (PROTONIX) 40 MG tablet [id number] No Take 1 tablet (40 mg total) by mouth 2 (two) times daily before a meal.  Patient not taking:  Reported on 06/26/2018   [name], NP Not Taking Unknown time Consider Medication Status and Discontinue (Patient has not taken in last 30 days) Pharmacy Records  predniSONE (DELTASONE) 20 MG tablet [id number] No Take 3 tabs daily X 1 day;  then 2 tabs daily X 2 days; then 1 tablet by mouth daily X 2 days; then 1/2 tablet by mouth daily X 3 days and stop prednisone.  Patient not taking:  Reported on 06/26/2018   Barton Dubois, MD Not Taking Unknown time Consider Medication Status and Discontinue (One time medication) Pharmacy Records  tamsulosin [facility]) 0.4 MG CAPS capsule [id number] No Take 1 capsule by mouth daily. [provider] Not Taking Unknown time Active Pharmacy Records            Med Note [name]   Tue Jun 26, 2018  7:56 PM)    traMADol (ULTRAM) 50 MG tablet [id number]  Take 1 tablet (50 mg total) by mouth every 6 (six) hours as needed.  Patient not taking:  Reported on 06/04/2018   Bryan Kocher, PA-C  Consider Medication Status and Discontinue (Patient Preference) Pharmacy Records           Successful outreach with Mr. Jawad. He denied complaints of shortness of breath during the call but reported decreased activity tolerance. Reported that he still fatigues easily but performs ADL's and completes short errands without difficulty. Denied falls or worsening symptoms since discharge. Reported medication compliance and received previous outreach from [place]. Denied current need for transportation assistance or additional outreach from [place] he is independent, and his son and a few relatives can assist if needed. Mr. Cristina denied urgent concerns but agreed to inform RNCM if his needs change.  THN CM Care Plan Problem One     Most Recent Value  Care Plan Problem One  High risk for Readmission  Role Documenting the Problem One  Care Management [place] for Problem One  Active  THN Long Term Goal   Over the next 90 days patient will not be admitted for complications r/t COPD.  THN Long Term Goal Start Date  07/11/18  Interventions for Problem One Long Term Goal  Discussed medications, nutrition and s/sx that require urgent medical attention.  THN CM Short Term Goal #1   Over the next 30 days patient will attend all MD follow up appointments.  THN CM Short Term Goal #1 Start Date  07/11/18  Interventions for Short Term Goal #1  Discussed pending PCP and specialty follow ups and transportation needs.  THN CM Short Term Goal #2   Over the next 30 days patient will take all medications as prescribed.  THN CM Short Term Goal #2 Start Date  07/11/18  Interventions for Short Term Goal #2  Reviewed medications and indications for use. Discussed  medication affordability and importance of medication compliance. [Reported previous outreach with [facility] regarding medications.]  THN CM Short Term Goal #3  Over the next 30 days patient will verbalize knowledge of COPD self management.  THN CM Short Term Goal #3 Start Date  07/11/18  Interventions for Short Tern Goal #3  Discussed COPD zones and s/sx the require MD notification.      PLAN Will continue routine outreach.   [place] [phone]

## 2018-07-12 DIAGNOSIS — I10 Essential (primary) hypertension: Secondary | ICD-10-CM | POA: Diagnosis not present

## 2018-07-12 DIAGNOSIS — K219 Gastro-esophageal reflux disease without esophagitis: Secondary | ICD-10-CM | POA: Diagnosis not present

## 2018-07-12 DIAGNOSIS — J449 Chronic obstructive pulmonary disease, unspecified: Secondary | ICD-10-CM | POA: Diagnosis not present

## 2018-07-12 DIAGNOSIS — E1142 Type 2 diabetes mellitus with diabetic polyneuropathy: Secondary | ICD-10-CM | POA: Diagnosis not present

## 2018-07-16 DIAGNOSIS — R0602 Shortness of breath: Secondary | ICD-10-CM | POA: Diagnosis not present

## 2018-07-16 DIAGNOSIS — J441 Chronic obstructive pulmonary disease with (acute) exacerbation: Secondary | ICD-10-CM | POA: Diagnosis not present

## 2018-07-16 DIAGNOSIS — J4520 Mild intermittent asthma, uncomplicated: Secondary | ICD-10-CM | POA: Diagnosis not present

## 2018-07-16 DIAGNOSIS — J449 Chronic obstructive pulmonary disease, unspecified: Secondary | ICD-10-CM | POA: Diagnosis not present

## 2018-07-20 ENCOUNTER — Ambulatory Visit: Payer: Medicare HMO | Admitting: Urology

## 2018-07-20 DIAGNOSIS — R351 Nocturia: Secondary | ICD-10-CM | POA: Diagnosis not present

## 2018-07-20 DIAGNOSIS — N401 Enlarged prostate with lower urinary tract symptoms: Secondary | ICD-10-CM

## 2018-07-20 DIAGNOSIS — N3941 Urge incontinence: Secondary | ICD-10-CM

## 2018-07-23 ENCOUNTER — Other Ambulatory Visit: Payer: Self-pay

## 2018-07-23 NOTE — Patient Outreach (Signed)
[place] [facility]) Care Management  07/23/2018  SEAVER MACHIA August 03, 1956 [id number]   Successful outreach with Mr. Delellis. He reported no changes since last conversation and stated that he was doing well today. Still experiences shortness of breath with exertion. Reported medication compliance and denied falls. Attended follow up with Dr. Legrand Rams as scheduled on November 8th. No urgent concerns at the time of the call. Agreed to contact RNCM with questions or concerns as needed.  PLAN Will follow up on next week.   [place] [phone]

## 2018-07-30 ENCOUNTER — Other Ambulatory Visit: Payer: Self-pay

## 2018-07-30 NOTE — Patient Outreach (Signed)
[place] [facility]) Care Management  07/30/2018  AVANTAE BITHER October 20, 1955 [id number]   Successful outreach with Mr. Savidge. Limited conversation due to member being in public area at the time of the call. Reported feeling well, taking medications as prescribed and denied complaints of shortness of breath or discomfort.  Reported no changes or ED visits since previous discharge.  No urgent concerns but agreeable to further outreach to discuss available community resources.   PLAN Will continue routine outreach.   [place] [phone]

## 2018-08-08 ENCOUNTER — Other Ambulatory Visit: Payer: Self-pay

## 2018-08-08 ENCOUNTER — Ambulatory Visit: Payer: Self-pay

## 2018-08-08 NOTE — Patient Outreach (Signed)
[place] [facility]) Care Management  08/08/2018  Bryan [name] 1955-11-15 [id number]   Successful outreach with Bryan [name]. Reported feeling well today and only experiencing shortness of breath when moving too quickly. Reported completing ADLs and short task in the home without difficulty. Member still does not require supplemental oxygen. Compliant with medications and glucose monitoring. Reported a fasting blood sugar of 98. He did not feel that assistance was needed in the home but was agreeable to outreach from [employer].   THN CM Care Plan Problem One     Most Recent Value  Care Plan Problem One  High risk for Readmission  Role Documenting the Problem One  Care Management [place] for Problem One  Active  THN Long Term Goal   Over the next 90 days patient will not be admitted for complications r/t COPD.  THN Long Term Goal Start Date  07/11/18  Interventions for Problem One Long Term Goal  Discussed seasonal triggers and s/sx of COPD exacerbation. Reviewed medications.  THN CM Short Term Goal #1   Over the next 30 days patient will attend all MD follow up appointmens.  THN CM Short Term Goal #1 Start Date  07/11/18  THN CM Short Term Goal #1 Met Date  08/08/18  THN CM Short Term Goal #2   Over the next 30 days patient will take medications as prescribed.  THN CM Short Term Goal #2 Start Date  07/11/18  THN CM Short Term Goal #2 Met Date  08/08/18  THN CM Short Term Goal #3  Over the next 30 days patient will verbalize knowledge of COPD self management.  THN CM Short Term Goal #3 Start Date  07/11/18  Interventions for Short Tern Goal #3  Reviewed medications and indications for use. Discussed s/sx of COPD exacerbation.     PLAN Will follow up next month.   [place] [phone]

## 2018-08-09 ENCOUNTER — Ambulatory Visit: Payer: Medicare HMO

## 2018-08-10 ENCOUNTER — Other Ambulatory Visit: Payer: Self-pay

## 2018-08-15 ENCOUNTER — Ambulatory Visit: Payer: Medicare HMO | Admitting: Gastroenterology

## 2018-08-15 ENCOUNTER — Encounter: Payer: Self-pay | Admitting: Gastroenterology

## 2018-08-15 VITALS — BP 135/91 | HR 100 | Temp 97.3°F | Ht 63.0 in | Wt 127.2 lb

## 2018-08-15 DIAGNOSIS — R0602 Shortness of breath: Secondary | ICD-10-CM | POA: Diagnosis not present

## 2018-08-15 DIAGNOSIS — K5900 Constipation, unspecified: Secondary | ICD-10-CM | POA: Diagnosis not present

## 2018-08-15 DIAGNOSIS — J449 Chronic obstructive pulmonary disease, unspecified: Secondary | ICD-10-CM | POA: Diagnosis not present

## 2018-08-15 DIAGNOSIS — J441 Chronic obstructive pulmonary disease with (acute) exacerbation: Secondary | ICD-10-CM | POA: Diagnosis not present

## 2018-08-15 DIAGNOSIS — J4520 Mild intermittent asthma, uncomplicated: Secondary | ICD-10-CM | POA: Diagnosis not present

## 2018-08-15 NOTE — Assessment & Plan Note (Signed)
Not ideally managed. Start Linzess 290 mcg daily. Samples provided. Progress report requested. Return in 3 months.

## 2018-08-15 NOTE — Progress Notes (Signed)
CC'D TO PCP °

## 2018-08-15 NOTE — Progress Notes (Signed)
Referring Provider: Rosita Fire, MD Primary Care Physician:  Rosita Fire, MD Primary GI: Dr. Gala Romney   Chief Complaint  Patient presents with  . Abdominal Pain    occurs about qod    HPI:   Bryan [name] is a 62 y.o. male presenting today with a history of chronic abdominal pain, constipation, GERD, esophagitis, dysphagia. He reported dysphagia despite empiric dilation in Sept 2018. Known esophagitis. BPE completed without motility issues. No stricture. Due to abdominal pain, HIDA completed and normal with EF 92%. CT without contrast May 2019 with wall thickening throughout sigmoid without focal inflammatory changes, possibly due to underdistension, unable to exclude low grade colitis. (last colonoscopy Sept 2018) I discussed with radiologist and low likelihood of infectious process. Pancreas unremarkable on non-contrast exam. He was hospitalized in July due to abdominal/chest pain. Negative cardiac markers.  Weight 127 today. 120 Aug 2019, 115 July 2019.    Linzess 72 mcg not effective. Abdomen cramping with eating and feels full of stool despite Linzess. Amitiza caused cramping. Friend takes a little pill and he wants to try this too. Called friend, who states it was Linzess.    CTA chest 4 mm nodule in left lower lobe Aug 2019. Recommend non-contrast chest CT in 12 months. History of smoking 1-1.5 ppd X 45 years, no smoking in 4 years. No family history of lung cancer.   Past Medical History:  Diagnosis Date  . Acid reflux   . Arthritis   . Asthma   . Chronic pain    with leg and back pain (disc problem)  . COPD (chronic obstructive pulmonary disease) ([place])   . Diabetes mellitus without complication ([place])   . Headache    HX OF  . History of upper GI x-ray series    to follow showed large duodenal ulcer H pylori serologies were negative  . Hypertension   . Pneumonia    07/17/17  . Pre-diabetes   . Stroke [facility])    TIA MINI STROKE  . Tubular adenoma     Past Surgical  History:  Procedure Laterality Date  . BACK SURGERY  7/05; 5/09    Dr.Hirsch,3 lumbar  X3  . BACK SURGERY    . COLONOSCOPY  Feb 2012   Dr. Gala Romney: normal rectum, pedunculate polyp removed but not recovered  . COLONOSCOPY WITH ESOPHAGOGASTRODUODENOSCOPY (EGD) N/A 11/18/2013   Dr.Rourk- tcs= normal rectum, multipe polyps about the ileocecal valve and distal transverse colon o/w the remainder of the colonic mucosa appeared normal bx= tubular adenoma. EGD= normal esophagus, stomach with scattered erosions mottling, friablility, no ulcer or infiltrating process patent pylorus bx= chronic inflammation. next TCS 11/2016  . COLONOSCOPY WITH PROPOFOL N/A 05/25/2017   Dr. Gala Romney: sigmoid diverticulosis, one 4 mm hyerplastic rectal polyp, ascending colonic AVMs surveillance 2023  . ESOPHAGOGASTRODUODENOSCOPY  1/06   Dr. Volney American esophageal erosions,U-shaped stomach,marked erosions and edema of the bulb without discrete ulcer disease.   . ESOPHAGOGASTRODUODENOSCOPY (EGD) WITH PROPOFOL N/A 05/25/2017   Dr. Gala Romney: reflux esophagitis s/p empiric dilation, normal stomach and duodenum  . MALONEY DILATION N/A 05/25/2017   Procedure: Venia Minks DILATION;  Surgeon: Daneil Dolin, MD;  Location: [facility];  Service: Endoscopy;  Laterality: N/A;  . NECK SURGERY  10/2007   PLATE IN NECK  . POLYPECTOMY  05/25/2017   Procedure: POLYPECTOMY;  Surgeon: Daneil Dolin, MD;  Location: [facility];  Service: Endoscopy;;  rectal  . TRANSURETHRAL RESECTION OF PROSTATE N/A 08/17/2017   Procedure:  TRANSURETHRAL RESECTION OF THE PROSTATE (TURP);  Surgeon: Irine Seal, MD;  Location: [facility];  Service: Urology;  Laterality: N/A;    Current Outpatient Medications  Medication Sig Dispense Refill  . albuterol (PROVENTIL HFA;VENTOLIN HFA) 108 (90 Base) MCG/ACT inhaler Inhale 1-2 puffs every 4 (four) hours as needed into the lungs for wheezing or shortness of breath. Do not use with nebulizer (Patient taking differently: Inhale 1  puff into the lungs every 4 (four) hours as needed for wheezing or shortness of breath. Do not use with nebulizer) 1 Inhaler 0  . albuterol (PROVENTIL) (2.5 MG/3ML) 0.083% nebulizer solution Take 3 mLs (2.5 mg total) by nebulization every 4 (four) hours as needed for wheezing or shortness of breath. 75 mL 0  . amLODipine (NORVASC) 5 MG tablet Take 1 tablet (5 mg total) by mouth daily. 30 tablet 11  . fluticasone (FLONASE) 50 MCG/ACT nasal spray Place 1 spray into both nostrils daily. (Patient taking differently: Place 1 spray into both nostrils as needed. ) 16 g 1  . Fluticasone-Umeclidin-Vilant (TRELEGY ELLIPTA) 100-62.5-25 MCG/INH AEPB Inhale 1 puff into the lungs daily. 28 each 0  . gabapentin (NEURONTIN) 300 MG capsule Take 1 capsule (300 mg total) by mouth 2 (two) times daily. Pt. Says he is taking twice daily. (Patient taking differently: Take 300 mg by mouth as needed. )    . guaiFENesin (MUCINEX) 600 MG 12 hr tablet Take 1 tablet (600 mg total) by mouth 2 (two) times daily. 40 tablet 0  . linaclotide (LINZESS) 72 MCG capsule Take 1 capsule (72 mcg total) by mouth daily before breakfast. (Patient taking differently: Take 72 mcg by mouth as needed. ) 90 capsule 3  . pantoprazole (PROTONIX) 40 MG tablet Take 1 tablet (40 mg total) by mouth 2 (two) times daily before a meal. 60 tablet 5  . predniSONE (DELTASONE) 10 MG tablet Take 75m po daily for 2 days then 325mdaily for 2 days then 2051maily for 2 days then 75m63mily for 2 days then stop 20 tablet 0  . tamsulosin (FLOMAX) 0.4 MG CAPS capsule Take 1 capsule by mouth daily.    . metFORMIN (GLUCOPHAGE) 500 MG tablet Take 1 tablet (500 mg total) daily with breakfast by mouth. (Patient not taking: Reported on 06/04/2018) 30 tablet 0   No current facility-administered medications for this visit.     Allergies as of 08/15/2018  . (No Known Allergies)    Family History  Problem Relation Age of Onset  . Cancer Father   . Asthma Mother   .  Colon cancer Neg Hx     Social History   Socioeconomic History  . Marital status: Single    Spouse name: Not on file  . Number of children: 2  . Years of education: Not on file  . Highest education level: Not on file  Occupational History  . 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 Financial resource strain: Not on file  . Food insecurity:    Worry: Not on file    Inability: Not on file  . Transportation needs:    Medical: Not on file    Non-medical: Not on file  Tobacco Use  . Smoking status: Former Smoker    Packs/day: 0.50    Years: 40.00    Pack years: 20.00    Types: Cigarettes    Last attempt to quit: 11/11/2014    Years since quitting: 3.7  . Smokeless tobacco: Never Used  Substance and Sexual Activity  . Alcohol use: No    Alcohol/week: 0.0 standard drinks  . Drug use: Not Currently    Types: Marijuana    Comment:  'every once in a while" ; denied 08/15/18  . Sexual activity: Not Currently    Birth control/protection: None  Lifestyle  . Physical activity:    Days per week: Not on file    Minutes per session: Not on file  . Stress: Not on file  Relationships  . Social connections:    Talks on phone: Not on file    Gets together: Not on file    Attends religious service: Not on file    Active member of club or organization: Not on file    Attends meetings of clubs or organizations: Not on file    Relationship status: Not on file  Other Topics Concern  . Not on file  Social History Narrative   Lives with girlfriend, is on disability for history of back injuries.    Review of Systems: Gen: Denies fever, chills, anorexia. Denies fatigue, weakness, weight loss.  CV: Denies chest pain, palpitations, syncope, peripheral edema, and claudication. Resp: Denies dyspnea at rest, cough, wheezing, coughing up blood, and pleurisy. GI: see HPI  Derm: Denies rash, itching, dry skin Psych: Denies depression, anxiety, memory loss, confusion. No  homicidal or suicidal ideation.  Heme: Denies bruising, bleeding, and enlarged lymph nodes.  Physical Exam: BP (!) 135/91   Pulse 100   Temp (!) 97.3 F (36.3 C) (Oral)   Ht _0  (1.6 m)   Wt 127 lb 3.2 oz (57.7 kg)   BMI 22.53 kg/m  General:   Alert and oriented. No distress noted. Pleasant and cooperative.  Head:  Normocephalic and atraumatic. Eyes:  Conjuctiva clear without scleral icterus. Mouth:  Oral mucosa pink and moist.  Abdomen:  +BS, soft, non-tender and non-distended. No rebound or guarding. No HSM or masses noted. Msk:  Symmetrical without gross deformities. Normal posture. Extremities:  Without edema. Neurologic:  Alert and  oriented x4 Psych:  Alert and cooperative. Normal mood and affect.

## 2018-08-15 NOTE — Patient Instructions (Signed)
I would like for you to stop Linzess 72 micrograms and try the highest dose of 290 micrograms daily, 30 minutes before breakfast You may have some loose stool for several days while trying it but should get better. Let me know how this does for you!  I will see you in 3 months!  I enjoyed seeing you again today! As you know, I value our relationship and want to provide genuine, compassionate, and quality care. I welcome your feedback. If you receive a survey regarding your visit,  I greatly appreciate you taking time to fill this out. See you next time!  [name], PhD, ANP-BC [facility]

## 2018-08-30 ENCOUNTER — Ambulatory Visit: Payer: Self-pay

## 2018-09-03 ENCOUNTER — Other Ambulatory Visit: Payer: Self-pay

## 2018-09-03 NOTE — Patient Outreach (Signed)
Bryan [name] [facility]) Care Management  09/03/2018  Bryan [name] 11/26/1955 [id number]   Successful outreach with Bryan [name]. He reported feeling "very good" and was evaluated by Dr. Legrand Rams on today. Reported increased energy, good appetite and increased activity tolerance. No complaints of discomfort or shortness of breath. He reported taking medications as prescribed but requested assistance regarding nebulizer maintenance. Reported that equipment was obtained from [facility] within the last year.   Call placed to [facility]. Staff will follow up to verify equipment and schedule maintenance if needed.   PLAN Will follow up next week to confirm nebulizer maintenance and reevaluate care management needs.   [place] [phone]

## 2018-09-13 ENCOUNTER — Other Ambulatory Visit: Payer: Self-pay

## 2018-09-13 NOTE — Patient Outreach (Signed)
[place] [facility]) Care Management  09/13/2018  EDIE DARLEY 05-07-1956 [id number]   Follow up outreach regarding nebulizer maintenance. Mr. Bitting reported that he was still able to use his nebulizer but has not been contacted by [employer] regarding maintenance. Reported he was not home at the time of the call but will verify the equipment label when he returns.   PLAN Will follow up next week.   [place] [phone]

## 2018-09-16 ENCOUNTER — Observation Stay (HOSPITAL_COMMUNITY)
Admission: EM | Admit: 2018-09-16 | Discharge: 2018-09-18 | Disposition: A | Discharge status: Home / Self Care | Payer: Medicare Other | Attending: Internal Medicine | Admitting: Internal Medicine

## 2018-09-16 ENCOUNTER — Emergency Department (HOSPITAL_COMMUNITY): Payer: Medicare Other

## 2018-09-16 ENCOUNTER — Other Ambulatory Visit: Payer: Self-pay

## 2018-09-16 ENCOUNTER — Encounter (HOSPITAL_COMMUNITY): Payer: Self-pay | Admitting: Emergency Medicine

## 2018-09-16 DIAGNOSIS — D631 Anemia in chronic kidney disease: Secondary | ICD-10-CM | POA: Diagnosis not present

## 2018-09-16 DIAGNOSIS — K5900 Constipation, unspecified: Secondary | ICD-10-CM | POA: Insufficient documentation

## 2018-09-16 DIAGNOSIS — E119 Type 2 diabetes mellitus without complications: Secondary | ICD-10-CM | POA: Insufficient documentation

## 2018-09-16 DIAGNOSIS — J441 Chronic obstructive pulmonary disease with (acute) exacerbation: Secondary | ICD-10-CM | POA: Diagnosis not present

## 2018-09-16 DIAGNOSIS — I129 Hypertensive chronic kidney disease with stage 1 through stage 4 chronic kidney disease, or unspecified chronic kidney disease: Secondary | ICD-10-CM | POA: Insufficient documentation

## 2018-09-16 DIAGNOSIS — N183 Chronic kidney disease, stage 3 unspecified: Secondary | ICD-10-CM | POA: Diagnosis present

## 2018-09-16 DIAGNOSIS — K2100 Gastro-esophageal reflux disease with esophagitis, without bleeding: Secondary | ICD-10-CM

## 2018-09-16 DIAGNOSIS — Z79899 Other long term (current) drug therapy: Secondary | ICD-10-CM | POA: Insufficient documentation

## 2018-09-16 DIAGNOSIS — R079 Chest pain, unspecified: Secondary | ICD-10-CM | POA: Diagnosis present

## 2018-09-16 DIAGNOSIS — Z8673 Personal history of transient ischemic attack (TIA), and cerebral infarction without residual deficits: Secondary | ICD-10-CM | POA: Insufficient documentation

## 2018-09-16 DIAGNOSIS — E876 Hypokalemia: Secondary | ICD-10-CM | POA: Diagnosis not present

## 2018-09-16 DIAGNOSIS — E1169 Type 2 diabetes mellitus with other specified complication: Secondary | ICD-10-CM | POA: Diagnosis present

## 2018-09-16 DIAGNOSIS — N400 Enlarged prostate without lower urinary tract symptoms: Secondary | ICD-10-CM | POA: Diagnosis present

## 2018-09-16 DIAGNOSIS — R0603 Acute respiratory distress: Secondary | ICD-10-CM

## 2018-09-16 DIAGNOSIS — K219 Gastro-esophageal reflux disease without esophagitis: Secondary | ICD-10-CM | POA: Diagnosis not present

## 2018-09-16 DIAGNOSIS — I10 Essential (primary) hypertension: Secondary | ICD-10-CM

## 2018-09-16 DIAGNOSIS — E785 Hyperlipidemia, unspecified: Secondary | ICD-10-CM | POA: Diagnosis not present

## 2018-09-16 DIAGNOSIS — D649 Anemia, unspecified: Secondary | ICD-10-CM | POA: Diagnosis present

## 2018-09-16 DIAGNOSIS — D509 Iron deficiency anemia, unspecified: Secondary | ICD-10-CM | POA: Diagnosis present

## 2018-09-16 LAB — BLOOD GAS, ARTERIAL
ACID-BASE EXCESS: 6.4 mmol/L — AB (ref 0.0–2.0)
BICARBONATE: 29.2 mmol/L — AB (ref 20.0–28.0)
Drawn by: 221791
FIO2: 21
O2 Saturation: 80.1 %
Patient temperature: NORMAL
pCO2 arterial: 51.1 mmHg — ABNORMAL HIGH (ref 32.0–48.0)
pH, Arterial: 7.402 (ref 7.350–7.450)
pO2, Arterial: 48 mmHg — ABNORMAL LOW (ref 83.0–108.0)

## 2018-09-16 LAB — CBC
HCT: 39.2 % (ref 39.0–52.0)
Hemoglobin: 12.3 g/dL — ABNORMAL LOW (ref 13.0–17.0)
MCH: 27 pg (ref 26.0–34.0)
MCHC: 31.4 g/dL (ref 30.0–36.0)
MCV: 86.2 fL (ref 80.0–100.0)
Platelets: 245 10*3/uL (ref 150–400)
RBC: 4.55 MIL/uL (ref 4.22–5.81)
RDW: 16.7 % — ABNORMAL HIGH (ref 11.5–15.5)
WBC: 13 10*3/uL — ABNORMAL HIGH (ref 4.0–10.5)
nRBC: 0 % (ref 0.0–0.2)

## 2018-09-16 LAB — BASIC METABOLIC PANEL
ANION GAP: 6 (ref 5–15)
BUN: 14 mg/dL (ref 8–23)
CO2: 28 mmol/L (ref 22–32)
Calcium: 8.7 mg/dL — ABNORMAL LOW (ref 8.9–10.3)
Chloride: 105 mmol/L (ref 98–111)
Creatinine, Ser: 1.77 mg/dL — ABNORMAL HIGH (ref 0.61–1.24)
GFR calc Af Amer: 47 mL/min — ABNORMAL LOW (ref >60)
GFR calc non Af Amer: 40 mL/min — ABNORMAL LOW (ref >60)
Glucose, Bld: 147 mg/dL — ABNORMAL HIGH (ref 70–99)
Potassium: 3.4 mmol/L — ABNORMAL LOW (ref 3.5–5.1)
Sodium: 139 mmol/L (ref 135–145)

## 2018-09-16 LAB — MAGNESIUM: Magnesium: 1.7 mg/dL (ref 1.7–2.4)

## 2018-09-16 LAB — PHOSPHORUS: Phosphorus: 3 mg/dL (ref 2.5–4.6)

## 2018-09-16 LAB — I-STAT TROPONIN, ED: TROPONIN I, POC: 0.01 ng/mL (ref 0.00–0.08)

## 2018-09-16 LAB — D-DIMER, QUANTITATIVE: D-Dimer, Quant: 0.8 ug/mL-FEU — ABNORMAL HIGH (ref 0.00–0.50)

## 2018-09-16 MED ORDER — ALBUTEROL (5 MG/ML) CONTINUOUS INHALATION SOLN
10.0000 mg/h | INHALATION_SOLUTION | RESPIRATORY_TRACT | Status: AC
  Administered 2018-09-16 (×2): 10 mg/h via RESPIRATORY_TRACT

## 2018-09-16 MED ORDER — ALBUTEROL SULFATE (2.5 MG/3ML) 0.083% IN NEBU: 2.5000 mg | INHALATION_SOLUTION | RESPIRATORY_TRACT | Status: DC | PRN

## 2018-09-16 MED ORDER — ENOXAPARIN SODIUM 40 MG/0.4ML ~~LOC~~ SOLN: 40.0000 mg | SUBCUTANEOUS | Status: DC

## 2018-09-16 MED ORDER — MAGNESIUM SULFATE 2 GM/50ML IV SOLN
2.0000 g | Freq: Once | INTRAVENOUS | Status: AC
  Administered 2018-09-16: 2 g via INTRAVENOUS
  Filled 2018-09-16: qty 50

## 2018-09-16 MED ORDER — SODIUM CHLORIDE 0.9 % IV BOLUS
1000.0000 mL | Freq: Once | INTRAVENOUS | Status: AC
  Administered 2018-09-17: 1000 mL via INTRAVENOUS

## 2018-09-16 MED ORDER — METHYLPREDNISOLONE SODIUM SUCC 125 MG IJ SOLR
125.0000 mg | Freq: Once | INTRAMUSCULAR | Status: AC
  Administered 2018-09-16: 125 mg via INTRAVENOUS
  Filled 2018-09-16: qty 2

## 2018-09-16 MED ORDER — POTASSIUM CHLORIDE CRYS ER 20 MEQ PO TBCR
40.0000 meq | EXTENDED_RELEASE_TABLET | Freq: Once | ORAL | Status: AC
  Administered 2018-09-16: 40 meq via ORAL
  Filled 2018-09-16: qty 2

## 2018-09-16 MED ORDER — KETOROLAC TROMETHAMINE 30 MG/ML IJ SOLN
30.0000 mg | Freq: Once | INTRAMUSCULAR | Status: AC
  Administered 2018-09-16: 30 mg via INTRAVENOUS
  Filled 2018-09-16: qty 1

## 2018-09-16 MED ORDER — ACETAMINOPHEN 650 MG RE SUPP: 650.0000 mg | Freq: Four times a day (QID) | RECTAL | Status: DC | PRN

## 2018-09-16 MED ORDER — IPRATROPIUM BROMIDE 0.02 % IN SOLN: 0.5000 mg | Freq: Four times a day (QID) | RESPIRATORY_TRACT | Status: DC

## 2018-09-16 MED ORDER — DOXYCYCLINE HYCLATE 100 MG PO TABS
100.0000 mg | ORAL_TABLET | Freq: Once | ORAL | Status: AC
  Administered 2018-09-16: 100 mg via ORAL
  Filled 2018-09-16: qty 1

## 2018-09-16 MED ORDER — ALBUTEROL (5 MG/ML) CONTINUOUS INHALATION SOLN
10.0000 mg/h | INHALATION_SOLUTION | RESPIRATORY_TRACT | Status: AC
  Administered 2018-09-16: 10 mg/h via RESPIRATORY_TRACT
  Filled 2018-09-16: qty 20

## 2018-09-16 MED ORDER — ALBUTEROL SULFATE (2.5 MG/3ML) 0.083% IN NEBU
5.0000 mg | INHALATION_SOLUTION | Freq: Once | RESPIRATORY_TRACT | Status: AC
  Administered 2018-09-16: 5 mg via RESPIRATORY_TRACT
  Filled 2018-09-16: qty 6

## 2018-09-16 MED ORDER — ALBUTEROL SULFATE (2.5 MG/3ML) 0.083% IN NEBU: 2.5000 mg | INHALATION_SOLUTION | Freq: Four times a day (QID) | RESPIRATORY_TRACT | Status: DC

## 2018-09-16 MED ORDER — SODIUM CHLORIDE 0.9 % IV SOLN
INTRAVENOUS | Status: DC
  Administered 2018-09-16 – 2018-09-17 (×2): via INTRAVENOUS

## 2018-09-16 MED ORDER — POTASSIUM CHLORIDE IN NACL 20-0.9 MEQ/L-% IV SOLN
INTRAVENOUS | Status: AC
  Administered 2018-09-17: 01:00:00 via INTRAVENOUS

## 2018-09-16 MED ORDER — ACETAMINOPHEN 325 MG PO TABS
650.0000 mg | ORAL_TABLET | Freq: Four times a day (QID) | ORAL | Status: DC | PRN
  Administered 2018-09-17: 650 mg via ORAL
  Filled 2018-09-16: qty 2

## 2018-09-16 MED ORDER — IOPAMIDOL (ISOVUE-370) INJECTION 76%
75.0000 mL | Freq: Once | INTRAVENOUS | Status: AC | PRN
  Administered 2018-09-16: 75 mL via INTRAVENOUS

## 2018-09-16 NOTE — ED Notes (Signed)
CRITICAL VALUE ALERT  Critical Value:  pCO2 51.1  Date & Time Notied:  09/16/2017 1900  Provider Notified: Dr. Sabra Heck  Orders Received/Actions taken: See chart.

## 2018-09-16 NOTE — ED Triage Notes (Signed)
CP x 1hr Hervey Ard Former smoker

## 2018-09-16 NOTE — H&P (Signed)
History and Physical    Bryan [name] NID:[id number] DOB: [birth year] DOA: 09/16/2018  PCP: Rosita Fire, MD   Patient coming from: Home.  I have personally briefly reviewed patient's old medical records in [place]  Chief Complaint: SOB.  Sharp chest pain for 1 hour.  HPI: Bryan [name] is a 63 y.o. male with medical history significant of acid reflux, arthritis, asthma, chronic back pain, COPD, type 2 diabetes, hypertension, history of pneumonia, history of TIA, tubular adenoma who is coming to the emergency department with complaints of sharp central chest pain that started around 1500 today while he was watching a game on TV.  He describes pain as central, sharp, non-radiated, worsened by inspiration.  He states that less than an hour later he became short of breath and started wheezing.  He denies fever, chills, sore throat, rhinorrhea, hemoptysis, nausea, emesis, palpitations, diaphoresis, PND, orthopnea or recent pitting edema of the lower extremities.  He denies abdominal pain, diarrhea, melena or hematochezia.  He gets occasional constipation.  He complains of frequent nocturia 4-5 times a night, but denies dysuria, frequency or hematuria.  No polyuria, polydipsia, polyphagia or blurred vision.  He states his CBGs have been good at home.  ED Course: Initial vital signs temperature 98.2 F, pulse 97, respirations 21, blood pressure 137/85 mmHg and O2 sat 90% on nasal cannula oxygen.  The patient received a 5 mg continuous albuterol neb treatment, methylprednisolone 125 mg IVP x1, Toradol 30 mg IVP x1 and doxycycline 100 mg p.o. x1.  I added 40 mEq of potassium orally and magnesium sulfate 2 g IVPB.  His work-up shows a white count of 13.0, hemoglobin 12.3 g/dL and platelets of 245.  D-dimer is 0.8 mcg/mL.  Troponin was negative.  Potassium was 3.4 mmol/L.  BUN 14, creatinine 1.77, calcium 8.7 and glucose 147 mg/dL.  All other electrolytes are within normal limits.  ABG showed a normal pH  with increased PCO2 at 51.1 and decreased PO2 48.0 mmHg.  Bicarbonate was 29.2 and acid base Estes was 6.4 mmol/L.  O2 sat was 80.1%.  Review of Systems: As per HPI otherwise 10 point review of systems negative.   Past Medical History:  Diagnosis Date  . Acid reflux   . Arthritis   . Asthma   . Chronic pain    with leg and back pain (disc problem)  . COPD (chronic obstructive pulmonary disease) ([place])   . Diabetes mellitus without complication ([place])   . Headache    HX OF  . History of upper GI x-ray series    to follow showed large duodenal ulcer H pylori serologies were negative  . Hypertension   . Pneumonia    07/17/17  . Pre-diabetes   . Stroke [facility])    TIA MINI STROKE  . Tubular adenoma     Past Surgical History:  Procedure Laterality Date  . BACK SURGERY  7/05; 5/09    Dr.Hirsch,3 lumbar  X3  . BACK SURGERY    . COLONOSCOPY  Feb 2012   Dr. Gala Romney: normal rectum, pedunculate polyp removed but not recovered  . COLONOSCOPY WITH ESOPHAGOGASTRODUODENOSCOPY (EGD) N/A 11/18/2013   Dr.Rourk- tcs= normal rectum, multipe polyps about the ileocecal valve and distal transverse colon o/w the remainder of the colonic mucosa appeared normal bx= tubular adenoma. EGD= normal esophagus, stomach with scattered erosions mottling, friablility, no ulcer or infiltrating process patent pylorus bx= chronic inflammation. next TCS 11/2016  . COLONOSCOPY WITH PROPOFOL N/A 05/25/2017  Dr. Gala Romney: sigmoid diverticulosis, one 4 mm hyerplastic rectal polyp, ascending colonic AVMs surveillance 2023  . ESOPHAGOGASTRODUODENOSCOPY  1/06   Dr. Volney American esophageal erosions,U-shaped stomach,marked erosions and edema of the bulb without discrete ulcer disease.   . ESOPHAGOGASTRODUODENOSCOPY (EGD) WITH PROPOFOL N/A 05/25/2017   Dr. Gala Romney: reflux esophagitis s/p empiric dilation, normal stomach and duodenum  . MALONEY DILATION N/A 05/25/2017   Procedure: Venia Minks DILATION;  Surgeon: Daneil Dolin, MD;  Location: [facility]  ENDO SUITE;  Service: Endoscopy;  Laterality: N/A;  . NECK SURGERY  10/2007   PLATE IN NECK  . POLYPECTOMY  05/25/2017   Procedure: POLYPECTOMY;  Surgeon: Daneil Dolin, MD;  Location: [facility] ENDO SUITE;  Service: Endoscopy;;  rectal  . TRANSURETHRAL RESECTION OF PROSTATE N/A 08/17/2017   Procedure: TRANSURETHRAL RESECTION OF THE PROSTATE (TURP);  Surgeon: Irine Seal, MD;  Location: [facility] ORS;  Service: Urology;  Laterality: N/A;     reports that he quit smoking about 3 years ago. His smoking use included cigarettes. He has a 20.00 pack-year smoking history. He has never used smokeless tobacco. He reports previous drug use. Drug: Marijuana. He reports that he does not drink alcohol.  No Known Allergies  Family History  Problem Relation Age of Onset  . Cancer Father   . Asthma Mother   . Colon cancer Neg Hx    Prior to Admission medications   Medication Sig Start Date End Date Taking? Authorizing Provider  albuterol (PROVENTIL HFA;VENTOLIN HFA) 108 (90 Base) MCG/ACT inhaler Inhale 1-2 puffs every 4 (four) hours as needed into the lungs for wheezing or shortness of breath. Do not use with nebulizer Patient taking differently: Inhale 1 puff into the lungs every 4 (four) hours as needed for wheezing or shortness of breath. Do not use with nebulizer 07/17/17  Yes [name], MD  albuterol (PROVENTIL) (2.5 MG/3ML) 0.083% nebulizer solution Take 3 mLs (2.5 mg total) by nebulization every 4 (four) hours as needed for wheezing or shortness of breath. 06/29/18  Yes [name], MD  amLODipine (NORVASC) 5 MG tablet Take 1 tablet (5 mg total) by mouth daily. 03/13/18 03/13/19 Yes [name], MD  Fluticasone-Umeclidin-Vilant (TRELEGY ELLIPTA) 100-62.5-25 MCG/INH AEPB Inhale 1 puff into the lungs daily. 06/29/18  Yes [name], MD  gabapentin (NEURONTIN) 300 MG capsule Take 1 capsule (300 mg total) by mouth 2 (two) times daily. Pt. Says he is taking twice daily. Patient taking  differently: Take 300 mg by mouth 2 (two) times daily.  06/05/18  Yes Barton Dubois, MD  guaiFENesin (MUCINEX) 600 MG 12 hr tablet Take 1 tablet (600 mg total) by mouth 2 (two) times daily. Patient taking differently: Take 600 mg by mouth 2 (two) times daily as needed for cough or to loosen phlegm.  06/29/18 06/29/19 Yes [name], MD  linaclotide (LINZESS) 72 MCG capsule Take 1 capsule (72 mcg total) by mouth daily before breakfast. Patient taking differently: Take 72 mcg by mouth as needed.  05/08/18  Yes [name], NP  pantoprazole (PROTONIX) 40 MG tablet Take 1 tablet (40 mg total) by mouth 2 (two) times daily before a meal. 03/19/18  Yes [name], NP  tamsulosin (FLOMAX) 0.4 MG CAPS capsule Take 1 capsule by mouth daily. 12/18/17  Yes [provider]    Physical Exam: Vitals:   09/16/18 1900 09/16/18 1935 09/16/18 2030 09/16/18 2130  BP: 131/78  137/80 137/72  Pulse: 83  (!) 102 (!) 113  Resp: 20  (!) 28 (!)  23  Temp:      TempSrc:      SpO2: 100% 93% 98% 96%  Weight:      Height:        Constitutional: NAD, calm, comfortable Eyes: PERRL, lids and conjunctivae mildly injected. ENMT: Mucous membranes are dry. Posterior pharynx clear of any exudate or lesions. Neck: normal, supple, no masses, no thyromegaly Respiratory: Decreased breath sounds with bilateral rhonchi and wheezing, no crackles. Normal respiratory effort. No accessory muscle use.  Cardiovascular: Tachycardic at 109 bpm, no murmurs / rubs / gallops. No extremity edema. 2+ pedal pulses. No carotid bruits.  Abdomen: no tenderness, no masses palpated. No hepatosplenomegaly. Bowel sounds positive.  Musculoskeletal: no clubbing / cyanosis.  Good ROM, no contractures. Normal muscle tone.  Skin: no rashes, lesions, ulcers. No induration on limited dermatological examination. Neurologic: CN 2-12 grossly intact. Sensation intact, DTR normal. Strength 5/5 in all 4.  Psychiatric: Normal judgment and insight.  Alert and oriented x 3. Normal mood.   Labs on Admission: I have personally reviewed following labs and imaging studies  CBC: Recent Labs  Lab 09/16/18 1820  WBC 13.0*  HGB 12.3*  HCT 39.2  MCV 86.2  PLT 295   Basic Metabolic Panel: Recent Labs  Lab 09/16/18 1820  NA 139  K 3.4*  CL 105  CO2 28  GLUCOSE 147*  BUN 14  CREATININE 1.77*  CALCIUM 8.7*  MG 1.7  PHOS 3.0   GFR: Estimated Creatinine Clearance: 34.8 mL/min (A) (by C-G formula based on SCr of 1.77 mg/dL (H)). Liver Function Tests: No results for input(s): AST, ALT, ALKPHOS, BILITOT, PROT, ALBUMIN in the last 168 hours. No results for input(s): LIPASE, AMYLASE in the last 168 hours. No results for input(s): AMMONIA in the last 168 hours. Coagulation Profile: No results for input(s): INR, PROTIME in the last 168 hours. Cardiac Enzymes: No results for input(s): CKTOTAL, CKMB, CKMBINDEX, TROPONINI in the last 168 hours. BNP (last 3 results) No results for input(s): PROBNP in the last 8760 hours. HbA1C: No results for input(s): HGBA1C in the last 72 hours. CBG: No results for input(s): GLUCAP in the last 168 hours. Lipid Profile: No results for input(s): CHOL, HDL, LDLCALC, TRIG, CHOLHDL, LDLDIRECT in the last 72 hours. Thyroid Function Tests: No results for input(s): TSH, T4TOTAL, FREET4, T3FREE, THYROIDAB in the last 72 hours. Anemia Panel: No results for input(s): VITAMINB12, FOLATE, FERRITIN, TIBC, IRON, RETICCTPCT in the last 72 hours. Urine analysis:    Component Value Date/Time   COLORURINE YELLOW 06/04/2018 [place] 06/04/2018 1231   LABSPEC 1.012 06/04/2018 1231   PHURINE 6.0 06/04/2018 1231   GLUCOSEU NEGATIVE 06/04/2018 1231   HGBUR NEGATIVE 06/04/2018 1231   BILIRUBINUR NEGATIVE 06/04/2018 1231   KETONESUR NEGATIVE 06/04/2018 1231   PROTEINUR NEGATIVE 06/04/2018 1231   UROBILINOGEN 0.2 07/08/2015 1314   NITRITE NEGATIVE 06/04/2018 1231   LEUKOCYTESUR NEGATIVE 06/04/2018  1231    Radiological Exams on Admission: Ct Angio Chest Pe W And/or Wo Contrast  Result Date: 09/16/2018 CLINICAL DATA:  Intermittent shortness of breath and chest pressure and pain for 1 month. Symptoms increased over the last several days. EXAM: CT ANGIOGRAPHY CHEST WITH CONTRAST TECHNIQUE: Multidetector CT imaging of the chest was performed using the standard protocol during bolus administration of intravenous contrast. Multiplanar CT image reconstructions and MIPs were obtained to evaluate the vascular anatomy. CONTRAST:  49m ISOVUE-370 IOPAMIDOL (ISOVUE-370) INJECTION 76% COMPARISON:  Current chest radiograph.  Chest CTA, 06/04/2018. FINDINGS: Cardiovascular:  Satisfactory opacification of the pulmonary arteries to the segmental level. No evidence of pulmonary embolism. Normal heart size. No pericardial effusion. Mild left coronary artery calcifications. Great vessels normal in caliber. Mild noncalcified atherosclerosis along the descending thoracic aorta aortic arch branch vessels are widely patent. Mediastinum/Nodes: No enlarged mediastinal, hilar, or axillary lymph nodes. Thyroid gland, trachea, and esophagus demonstrate no significant findings. Lungs/Pleura: Mild centrilobular emphysema. Minor scarring in the left upper lobe, stable from the prior CT. Linear opacities in the right lower lobe consistent with a combination of mild scarring and subsegmental atelectasis. Mild stable scarring in the medial left lower lobe. No lung consolidation to suggest pneumonia. No pulmonary edema. No lung mass or suspicious nodule. No pleural effusion or pneumothorax. Upper Abdomen: Unremarkable. Musculoskeletal: No fracture or acute finding. No osteoblastic or osteolytic lesions. Review of the MIP images confirms the above findings. IMPRESSION: 1. No evidence of a pulmonary embolism. 2. No acute findings.  No pneumonia or pulmonary edema. 3. Mild areas of lung scarring. Centrilobular emphysema, stable from the prior  CT. 4. Mild aortic atherosclerosis. Mild left coronary artery calcifications. Aortic Atherosclerosis (ICD10-I70.0) and Emphysema (ICD10-J43.9). Electronically Signed   By: Lajean Manes M.D.   On: 09/16/2018 20:11   Dg Chest Portable 1 View  Result Date: 09/16/2018 CLINICAL DATA:  Shortness of breath. Chest pain for an hour. History of COPD. EXAM: PORTABLE CHEST 1 VIEW COMPARISON:  Chest radiograph June 26, 2018 FINDINGS: Cardiomediastinal silhouette is normal. No pleural effusions or focal consolidations. Trachea projects midline and there is no pneumothorax. Soft tissue planes and included osseous structures are non-suspicious. ACDF. IMPRESSION: No acute cardiopulmonary process. Electronically Signed   By: Elon Alas M.D.   On: 09/16/2018 18:33   Echocardiogram 03/13/2018 ------------------------------------------------------------------- LV EF: 60% -   65%  ------------------------------------------------------------------- Indications:      Chest pain 786.51.  ------------------------------------------------------------------- History:   PMH:  Former Smoker. GERD, ETOH.  Transient ischemic attack.  Chronic obstructive pulmonary disease.  Risk factors: Hypertension. Diabetes mellitus.  ------------------------------------------------------------------- Study Conclusions  - Left ventricle: The cavity size was normal. Wall thickness was   normal. Systolic function was normal. The estimated ejection   fraction was in the range of 60% to 65%. Wall motion was normal;   there were no regional wall motion abnormalities. Left   ventricular diastolic function parameters were normal. - Aortic valve: Mildly calcified annulus. Trileaflet. There was no   stenosis. - Atrial septum: No defect or patent foramen ovale was identified. - Tricuspid valve: There was moderate regurgitation. - Pulmonary arteries: Systolic pressure was moderately increased.   PA peak pressure: 49 mm Hg  (S).  EKG: Independently reviewed.  Vent. rate 98 BPM PR interval 124 ms QRS duration 72 ms QT/QTc 332/423 ms P-R-T axes 84 92 70 Normal sinus rhythm Rightward axis Borderline ECG Since last tracing rate faster  Assessment/Plan Principal Problem:   COPD exacerbation (HCC) Observation/telemetry. Continue supplemental oxygen. DuoNeb every 6 hours. Albuterol neb every 4 hours as needed. Continue doxycycline 100 mg p.o. twice daily. Continue glucocorticoids.  Active Problems:   GERD Continue Protonix 40 mg p.o. daily.    Hypertension Continue amlodipine 5 mg p.o. daily.    Constipation Continue Linzess 72 mcg p.o. every day.    Type 2 diabetes mellitus with hyperlipidemia (HCC) Carbohydrate modified diet. CBG monitoring with regular insulin sliding scale while receiving glucocorticoids.    CKD (chronic kidney disease), stage III (HCC) Gentle IV hydration. Monitor renal function and electrolytes    Hypokalemia  Replacing. Magnesium has been supplemented. Follow-up potassium level.    Anemia Anemia profile in October showed decrease iron and ferritin. Iron supplementation was suggested.  This could worsen his constipation though.    BPH (benign prostatic hyperplasia) Increase Flomax to twice daily. Should will follow-up with PCP and/or urology.    DVT prophylaxis: Lovenox SQ. Code Status: Full code. Family Communication: Disposition Plan: Observation for COPD exacerbation treatment. Consults called: Admission status: Observation/telemetry.   [name] MD [facility]  If 7PM-7AM, please contact night-coverage [email or web address] Password [facility]  09/16/2018, 11:02 PM   This document was prepared using Dragon voice recognition software and may contain some unintended transcription errors, which can be corrected upon review.

## 2018-09-16 NOTE — ED Notes (Signed)
Pt ambulated around nurses station X 1. Pt O2 started at 85% and was 80% when returning to the stretcher. MD notified.

## 2018-09-16 NOTE — ED Provider Notes (Signed)
[facility] EMERGENCY DEPARTMENT Provider Note   CSN: [id number] Arrival date & time: 09/16/18  1758     History   Chief Complaint Chief Complaint  Patient presents with  . Chest Pain    Bryan MOSI [name] is a 63 y.o. male.  Bryan  The patient is a 63 year old male who has severe COPD as well as diabetes and hypertension, he presents today with a chief complaint of chest pain which is sharp and stabbing, started 1 hour ago, it was acute in onset, persistent, worse with taking a deep bigger breath and not associated with any significant coughing fever or swelling of the legs or exertional symptoms.  My Review of the medical record shows that he had been admitted to the hospital approximately 2 months ago, he stayed 3 days and was discharged home in improved condition after a COPD exacerbation.  He reports that he is compliant with his medications including albuterol as needed, his antihypertensives as well as his diabetic medicine metformin.  He has not been on prednisone since he was discharged from the hospital and has not been on any recent antibiotics.  He is accompanied by his sister who corroborates the patient's story.  There is no radiation of the pain  Past Medical History:  Diagnosis Date  . Acid reflux   . Arthritis   . Asthma   . Chronic pain    with leg and back pain (disc problem)  . COPD (chronic obstructive pulmonary disease) ([place])   . Diabetes mellitus without complication ([place])   . Headache    HX OF  . History of upper GI x-ray series    to follow showed large duodenal ulcer H pylori serologies were negative  . Hypertension   . Pneumonia    07/17/17  . Pre-diabetes   . Stroke [facility])    TIA MINI STROKE  . Tubular adenoma     Patient Active Problem List   Diagnosis Date Noted  . Hypokalemia 06/26/2018  . Hypomagnesemia 06/26/2018  . Anemia 06/26/2018  . BPH (benign prostatic hyperplasia) 06/26/2018  . Chronic respiratory failure with hypoxia ([place])  06/04/2018  . HCAP (healthcare-associated pneumonia) 06/04/2018  . Acute respiratory failure with hypoxia ([place]) 04/14/2018  . Pulmonary nodule 04/14/2018  . CKD (chronic kidney disease), stage III ([place]) 04/14/2018  . Type 2 diabetes mellitus with hyperlipidemia ([place]) 03/12/2018  . BPH with obstruction/lower urinary tract symptoms 08/17/2017  . Left sided numbness 07/22/2017  . TIA (transient ischemic attack) 07/22/2017  . Pneumonia 07/14/2017  . COPD with acute exacerbation ([place]) 07/14/2017  . Constipation 07/10/2017  . Hx of adenomatous colonic polyps 05/01/2017  . Hypertension   . Chronic pain   . Heme positive stool 10/20/2014  . Epigastric pain 06/19/2014  . Dysphagia 06/19/2014  . AP (abdominal pain) 11/13/2013  . Early satiety 11/13/2013  . COPD, severe ([place]) 02/22/2013  . Low back pain 11/19/2012  . COLITIS 05/12/2009  . ABDOMINAL PAIN, LEFT LOWER QUADRANT 05/12/2009  . DUODENAL ULCER, HX OF 05/12/2009  . ALCOHOL USE 05/11/2009  . GERD 05/11/2009  . Diarrhea 05/11/2009  . SPONDYLOSIS, CERVICAL 07/03/2007  . NECK PAIN 07/03/2007    Past Surgical History:  Procedure Laterality Date  . BACK SURGERY  7/05; 5/09    Dr.Hirsch,3 lumbar  X3  . BACK SURGERY    . COLONOSCOPY  Feb 2012   Dr. Gala Romney: normal rectum, pedunculate polyp removed but not recovered  . COLONOSCOPY WITH ESOPHAGOGASTRODUODENOSCOPY (EGD) N/A 11/18/2013  Dr.Rourk- tcs= normal rectum, multipe polyps about the ileocecal valve and distal transverse colon o/w the remainder of the colonic mucosa appeared normal bx= tubular adenoma. EGD= normal esophagus, stomach with scattered erosions mottling, friablility, no ulcer or infiltrating process patent pylorus bx= chronic inflammation. next TCS 11/2016  . COLONOSCOPY WITH PROPOFOL N/A 05/25/2017   Dr. Gala Romney: sigmoid diverticulosis, one 4 mm hyerplastic rectal polyp, ascending colonic AVMs surveillance 2023  . ESOPHAGOGASTRODUODENOSCOPY  1/06   Dr. Volney American  esophageal erosions,U-shaped stomach,marked erosions and edema of the bulb without discrete ulcer disease.   . ESOPHAGOGASTRODUODENOSCOPY (EGD) WITH PROPOFOL N/A 05/25/2017   Dr. Gala Romney: reflux esophagitis s/p empiric dilation, normal stomach and duodenum  . MALONEY DILATION N/A 05/25/2017   Procedure: Venia Minks DILATION;  Surgeon: Daneil Dolin, MD;  Location: [facility] ENDO SUITE;  Service: Endoscopy;  Laterality: N/A;  . NECK SURGERY  10/2007   PLATE IN NECK  . POLYPECTOMY  05/25/2017   Procedure: POLYPECTOMY;  Surgeon: Daneil Dolin, MD;  Location: [facility] ENDO SUITE;  Service: Endoscopy;;  rectal  . TRANSURETHRAL RESECTION OF PROSTATE N/A 08/17/2017   Procedure: TRANSURETHRAL RESECTION OF THE PROSTATE (TURP);  Surgeon: Irine Seal, MD;  Location: [facility] ORS;  Service: Urology;  Laterality: N/A;        Home Medications    Prior to Admission medications   Medication Sig Start Date End Date Taking? Authorizing Provider  albuterol (PROVENTIL HFA;VENTOLIN HFA) 108 (90 Base) MCG/ACT inhaler Inhale 1-2 puffs every 4 (four) hours as needed into the lungs for wheezing or shortness of breath. Do not use with nebulizer Patient taking differently: Inhale 1 puff into the lungs every 4 (four) hours as needed for wheezing or shortness of breath. Do not use with nebulizer 07/17/17  Yes [name], MD  albuterol (PROVENTIL) (2.5 MG/3ML) 0.083% nebulizer solution Take 3 mLs (2.5 mg total) by nebulization every 4 (four) hours as needed for wheezing or shortness of breath. 06/29/18  Yes [name], MD  amLODipine (NORVASC) 5 MG tablet Take 1 tablet (5 mg total) by mouth daily. 03/13/18 03/13/19 Yes [name], MD  Fluticasone-Umeclidin-Vilant (TRELEGY ELLIPTA) 100-62.5-25 MCG/INH AEPB Inhale 1 puff into the lungs daily. 06/29/18  Yes [name], MD  gabapentin (NEURONTIN) 300 MG capsule Take 1 capsule (300 mg total) by mouth 2 (two) times daily. Pt. Says he is taking twice daily. Patient taking  differently: Take 300 mg by mouth 2 (two) times daily.  06/05/18  Yes Barton Dubois, MD  guaiFENesin (MUCINEX) 600 MG 12 hr tablet Take 1 tablet (600 mg total) by mouth 2 (two) times daily. Patient taking differently: Take 600 mg by mouth 2 (two) times daily as needed for cough or to loosen phlegm.  06/29/18 06/29/19 Yes [name], MD  linaclotide (LINZESS) 72 MCG capsule Take 1 capsule (72 mcg total) by mouth daily before breakfast. Patient taking differently: Take 72 mcg by mouth as needed.  05/08/18  Yes [name], NP  pantoprazole (PROTONIX) 40 MG tablet Take 1 tablet (40 mg total) by mouth 2 (two) times daily before a meal. 03/19/18  Yes [name], NP  tamsulosin (FLOMAX) 0.4 MG CAPS capsule Take 1 capsule by mouth daily. 12/18/17  Yes [provider]    Family History Family History  Problem Relation Age of Onset  . Cancer Father   . Asthma Mother   . Colon cancer Neg Hx     Social History Social History   Tobacco Use  . Smoking status:  Former Smoker    Packs/day: 0.50    Years: 40.00    Pack years: 20.00    Types: Cigarettes    Last attempt to quit: 11/11/2014    Years since quitting: 3.8  . Smokeless tobacco: Never Used  Substance Use Topics  . Alcohol use: No    Alcohol/week: 0.0 standard drinks  . Drug use: Not Currently    Types: Marijuana    Comment:  'every once in a while" ; denied 08/15/18     Allergies   Patient has no known allergies.   Review of Systems Review of Systems  All other systems reviewed and are negative.    Physical Exam Updated Vital Signs BP 137/80   Pulse (!) 102   Temp 98.7 F (37.1 C) (Oral)   Resp (!) 28   Ht 1.6 m (5' 3")   Wt 57.2 kg   SpO2 98%   BMI 22.32 kg/m   Physical Exam Vitals signs and nursing note reviewed.  Constitutional:      General: He is not in acute distress.    Appearance: He is well-developed.  HENT:     Head: Normocephalic and atraumatic.     Mouth/Throat:     Pharynx: No  oropharyngeal exudate.  Eyes:     General: No scleral icterus.       Right eye: No discharge.        Left eye: No discharge.     Conjunctiva/sclera: Conjunctivae normal.     Pupils: Pupils are equal, round, and reactive to light.  Neck:     Musculoskeletal: Normal range of motion and neck supple.     Thyroid: No thyromegaly.     Vascular: No JVD.  Cardiovascular:     Rate and Rhythm: Normal rate and regular rhythm.     Heart sounds: Normal heart sounds. No murmur. No friction rub. No gallop.   Pulmonary:     Effort: Pulmonary effort is normal. No respiratory distress.     Breath sounds: Wheezing ( Slight expiratory wheezing bilaterally) present. No rales.     Comments: The chest is normal to percussion bilaterally, good tympanitic sounds.  There is no decrease sounds on either side, there is no rales, he is able to speak in full sentences and is not pursed lip breathing or using accessory muscles or having any increased work of breathing.  He seems to be splinting when he takes a breath because of pain however when I palpate his chest it does not reproduce the pain.  It is just left of sternum inferior to the left pectoralis muscle Abdominal:     General: Bowel sounds are normal. There is no distension.     Palpations: Abdomen is soft. There is no mass.     Tenderness: There is no abdominal tenderness.  Musculoskeletal: Normal range of motion.        General: No tenderness.  Lymphadenopathy:     Cervical: No cervical adenopathy.  Skin:    General: Skin is warm and dry.     Findings: No erythema or rash.  Neurological:     Mental Status: He is alert.     Coordination: Coordination normal.  Psychiatric:        Behavior: Behavior normal.      ED Treatments / Results  Labs (all labs ordered are listed, but only abnormal results are displayed) Labs Reviewed  BASIC METABOLIC PANEL - Abnormal; Notable for the following components:      Result  Value   Potassium 3.4 (*)     Glucose, Bld 147 (*)    Creatinine, Ser 1.77 (*)    Calcium 8.7 (*)    GFR calc non Af Amer 40 (*)    GFR calc Af Amer 47 (*)    All other components within normal limits  CBC - Abnormal; Notable for the following components:   WBC 13.0 (*)    Hemoglobin 12.3 (*)    RDW 16.7 (*)    All other components within normal limits  D-DIMER, QUANTITATIVE (NOT AT [facility]) - Abnormal; Notable for the following components:   D-Dimer, Quant 0.80 (*)    All other components within normal limits  BLOOD GAS, ARTERIAL - Abnormal; Notable for the following components:   pCO2 arterial 51.1 (*)    pO2, Arterial 48.0 (*)    Bicarbonate 29.2 (*)    Acid-Base Excess 6.4 (*)    All other components within normal limits  I-STAT TROPONIN, ED    EKG EKG Interpretation  Date/Time:  Sunday September 16 2018 18:04:06 EST Ventricular Rate:  98 PR Interval:  124 QRS Duration: 72 QT Interval:  332 QTC Calculation: 423 R Axis:   92 Text Interpretation:  Normal sinus rhythm Rightward axis Borderline ECG Since last tracing rate faster Confirmed by Noemi Chapel [phone]) on 09/16/2018 6:07:44 PM   Radiology Ct Angio Chest Pe W And/or Wo Contrast  Result Date: 09/16/2018 CLINICAL DATA:  Intermittent shortness of breath and chest pressure and pain for 1 month. Symptoms increased over the last several days. EXAM: CT ANGIOGRAPHY CHEST WITH CONTRAST TECHNIQUE: Multidetector CT imaging of the chest was performed using the standard protocol during bolus administration of intravenous contrast. Multiplanar CT image reconstructions and MIPs were obtained to evaluate the vascular anatomy. CONTRAST:  61m ISOVUE-370 IOPAMIDOL (ISOVUE-370) INJECTION 76% COMPARISON:  Current chest radiograph.  Chest CTA, 06/04/2018. FINDINGS: Cardiovascular: Satisfactory opacification of the pulmonary arteries to the segmental level. No evidence of pulmonary embolism. Normal heart size. No pericardial effusion. Mild left coronary artery calcifications.  Great vessels normal in caliber. Mild noncalcified atherosclerosis along the descending thoracic aorta aortic arch branch vessels are widely patent. Mediastinum/Nodes: No enlarged mediastinal, hilar, or axillary lymph nodes. Thyroid gland, trachea, and esophagus demonstrate no significant findings. Lungs/Pleura: Mild centrilobular emphysema. Minor scarring in the left upper lobe, stable from the prior CT. Linear opacities in the right lower lobe consistent with a combination of mild scarring and subsegmental atelectasis. Mild stable scarring in the medial left lower lobe. No lung consolidation to suggest pneumonia. No pulmonary edema. No lung mass or suspicious nodule. No pleural effusion or pneumothorax. Upper Abdomen: Unremarkable. Musculoskeletal: No fracture or acute finding. No osteoblastic or osteolytic lesions. Review of the MIP images confirms the above findings. IMPRESSION: 1. No evidence of a pulmonary embolism. 2. No acute findings.  No pneumonia or pulmonary edema. 3. Mild areas of lung scarring. Centrilobular emphysema, stable from the prior CT. 4. Mild aortic atherosclerosis. Mild left coronary artery calcifications. Aortic Atherosclerosis (ICD10-I70.0) and Emphysema (ICD10-J43.9). Electronically Signed   By: DLajean ManesM.D.   On: 09/16/2018 20:11   Dg Chest Portable 1 View  Result Date: 09/16/2018 CLINICAL DATA:  Shortness of breath. Chest pain for an hour. History of COPD. EXAM: PORTABLE CHEST 1 VIEW COMPARISON:  Chest radiograph June 26, 2018 FINDINGS: Cardiomediastinal silhouette is normal. No pleural effusions or focal consolidations. Trachea projects midline and there is no pneumothorax. Soft tissue planes and included osseous structures are non-suspicious. ACDF.  IMPRESSION: No acute cardiopulmonary process. Electronically Signed   By: Elon Alas M.D.   On: 09/16/2018 18:33    Procedures .Critical Care Performed by: Noemi Chapel, MD Authorized by: Noemi Chapel, MD    Critical care provider statement:    Critical care time (minutes):  45   Critical care time was exclusive of:  Teaching time   Critical care was necessary to treat or prevent imminent or life-threatening deterioration of the following conditions:  Respiratory failure   Critical care was time spent personally by me on the following activities:  Discussions with consultants, evaluation of patient's response to treatment, examination of patient, ordering and performing treatments and interventions, ordering and review of laboratory studies, ordering and review of radiographic studies, pulse oximetry, re-evaluation of patient's condition, obtaining history from patient or surrogate and review of old charts   (including critical care time)  Medications Ordered in ED Medications  albuterol (PROVENTIL,VENTOLIN) solution continuous neb (0 mg/hr Nebulization Stopped 09/16/18 2109)  0.9 %  sodium chloride infusion (has no administration in time range)  albuterol (PROVENTIL,VENTOLIN) solution continuous neb (has no administration in time range)  doxycycline (VIBRA-TABS) tablet 100 mg (has no administration in time range)  albuterol (PROVENTIL) (2.5 MG/3ML) 0.083% nebulizer solution 5 mg (5 mg Nebulization Given 09/16/18 1840)  ketorolac (TORADOL) 30 MG/ML injection 30 mg (30 mg Intravenous Given 09/16/18 1838)  methylPREDNISolone sodium succinate (SOLU-MEDROL) 125 mg/2 mL injection 125 mg (125 mg Intravenous Given 09/16/18 1929)  iopamidol (ISOVUE-370) 76 % injection 75 mL (75 mLs Intravenous Contrast Given 09/16/18 1947)     Initial Impression / Assessment and Plan / ED Course  I have reviewed the triage vital signs and the nursing notes.  Pertinent labs & imaging results that were available during my care of the patient were reviewed by me and considered in my medical decision making (see chart for details).  Clinical Course as of Sep 17 2111  Nancy Fetter Sep 16, 2018  1844 I have personally viewed the chest  x-ray and find there to be no signs of pneumothorax which is corroborated by my bedside ultrasound.  These images were not archived.  His white blood cell count is up at 13,000 and he does have some slight renal insufficiency compared to baseline.  Initial troponin is negative.  Will give continuous nebulizer therapy, ABG.   [BM]  2024 The pt was reevaluated at 8:24 PM - he is on a continuous neb and feeling better with less pain and less SOB - he is wheezing less on exam and has had a negative CT angio for any clot, ptx or pna.     [BM]    Clinical Course User Index [BM] Noemi Chapel, MD    The patient's chest pain could be related to multiple factors including infection, pneumothorax, pneumomediastinum, pericarditis, acute coronary syndrome or pulmonary embolism though this could just be simple pleurisy as well.  I discussed this with the patient, chest x-ray was ordered and I personally looked at and interpreted the x-ray which shows no signs of pneumothorax or abnormal mediastinum.  There is no mediastinal air, there is no significant enlargement of the cardiac silhouette.  We will complete the work-up with d-dimer, troponin, labs, EKG.  EKG as documented above shows no signs of acute ischemia or arrhythmia  The patient is still having increasing work of breathing with mild tachypnea, diffuse expiratory wheezing and is very tight despite getting continuous nebulized therapy.  He is requiring a second round of the  same.  Steroids and antibiotics have been given, will discuss with hospitalist for admission for ongoing respiratory distress, critical care provided  Cussed the care with Dr. Olevia Bowens who will admit  Final Clinical Impressions(s) / ED Diagnoses   Final diagnoses:  Respiratory distress  COPD exacerbation ([place])     Noemi Chapel, MD 09/16/18 2224

## 2018-09-17 ENCOUNTER — Encounter (HOSPITAL_COMMUNITY): Payer: Self-pay

## 2018-09-17 DIAGNOSIS — J441 Chronic obstructive pulmonary disease with (acute) exacerbation: Secondary | ICD-10-CM | POA: Diagnosis not present

## 2018-09-17 DIAGNOSIS — N400 Enlarged prostate without lower urinary tract symptoms: Secondary | ICD-10-CM

## 2018-09-17 DIAGNOSIS — N183 Chronic kidney disease, stage 3 (moderate): Secondary | ICD-10-CM | POA: Diagnosis not present

## 2018-09-17 DIAGNOSIS — D508 Other iron deficiency anemias: Secondary | ICD-10-CM | POA: Diagnosis not present

## 2018-09-17 DIAGNOSIS — K5900 Constipation, unspecified: Secondary | ICD-10-CM

## 2018-09-17 LAB — CBC WITH DIFFERENTIAL/PLATELET
Abs Immature Granulocytes: 0.05 10*3/uL (ref 0.00–0.07)
Basophils Absolute: 0 10*3/uL (ref 0.0–0.1)
Basophils Relative: 0 %
Eosinophils Absolute: 0 10*3/uL (ref 0.0–0.5)
Eosinophils Relative: 0 %
HEMATOCRIT: 37.8 % — AB (ref 39.0–52.0)
Hemoglobin: 11.6 g/dL — ABNORMAL LOW (ref 13.0–17.0)
Immature Granulocytes: 1 %
Lymphocytes Relative: 4 %
Lymphs Abs: 0.4 10*3/uL — ABNORMAL LOW (ref 0.7–4.0)
MCH: 27 pg (ref 26.0–34.0)
MCHC: 30.7 g/dL (ref 30.0–36.0)
MCV: 88.1 fL (ref 80.0–100.0)
MONOS PCT: 1 %
Monocytes Absolute: 0.1 10*3/uL (ref 0.1–1.0)
Neutro Abs: 10.1 10*3/uL — ABNORMAL HIGH (ref 1.7–7.7)
Neutrophils Relative %: 94 %
Platelets: 226 10*3/uL (ref 150–400)
RBC: 4.29 MIL/uL (ref 4.22–5.81)
RDW: 17 % — AB (ref 11.5–15.5)
WBC: 10.7 10*3/uL — ABNORMAL HIGH (ref 4.0–10.5)
nRBC: 0 % (ref 0.0–0.2)

## 2018-09-17 LAB — TROPONIN I: Troponin I: <0.03 ng/mL (ref <0.03)

## 2018-09-17 LAB — BASIC METABOLIC PANEL
Anion gap: 7 (ref 5–15)
BUN: 20 mg/dL (ref 8–23)
CO2: 21 mmol/L — ABNORMAL LOW (ref 22–32)
Calcium: 8.5 mg/dL — ABNORMAL LOW (ref 8.9–10.3)
Chloride: 112 mmol/L — ABNORMAL HIGH (ref 98–111)
Creatinine, Ser: 1.48 mg/dL — ABNORMAL HIGH (ref 0.61–1.24)
GFR calc Af Amer: 58 mL/min — ABNORMAL LOW (ref >60)
GFR calc non Af Amer: 50 mL/min — ABNORMAL LOW (ref >60)
Glucose, Bld: 177 mg/dL — ABNORMAL HIGH (ref 70–99)
Potassium: 4.6 mmol/L (ref 3.5–5.1)
Sodium: 140 mmol/L (ref 135–145)

## 2018-09-17 LAB — GLUCOSE, CAPILLARY
Glucose-Capillary: 147 mg/dL — ABNORMAL HIGH (ref 70–99)
Glucose-Capillary: 155 mg/dL — ABNORMAL HIGH (ref 70–99)

## 2018-09-17 LAB — CBG MONITORING, ED: Glucose-Capillary: 158 mg/dL — ABNORMAL HIGH (ref 70–99)

## 2018-09-17 MED ORDER — ARFORMOTEROL TARTRATE 15 MCG/2ML IN NEBU
15.0000 ug | INHALATION_SOLUTION | Freq: Two times a day (BID) | RESPIRATORY_TRACT | Status: DC
  Administered 2018-09-17 – 2018-09-18 (×2): 15 ug via RESPIRATORY_TRACT
  Filled 2018-09-17 (×7): qty 2

## 2018-09-17 MED ORDER — ENOXAPARIN SODIUM 40 MG/0.4ML ~~LOC~~ SOLN: 40.0000 mg | SUBCUTANEOUS | Status: DC

## 2018-09-17 MED ORDER — LINACLOTIDE 72 MCG PO CAPS
72.0000 ug | ORAL_CAPSULE | ORAL | Status: DC | PRN
  Filled 2018-09-17: qty 1

## 2018-09-17 MED ORDER — GUAIFENESIN ER 600 MG PO TB12
600.0000 mg | ORAL_TABLET | Freq: Two times a day (BID) | ORAL | Status: DC
  Administered 2018-09-17 – 2018-09-18 (×3): 600 mg via ORAL
  Filled 2018-09-17 (×7): qty 1

## 2018-09-17 MED ORDER — GABAPENTIN 300 MG PO CAPS: 300.0000 mg | ORAL_CAPSULE | Freq: Two times a day (BID) | ORAL | Status: DC

## 2018-09-17 MED ORDER — PANTOPRAZOLE SODIUM 40 MG PO TBEC
40.0000 mg | DELAYED_RELEASE_TABLET | Freq: Two times a day (BID) | ORAL | Status: DC
  Administered 2018-09-17 – 2018-09-18 (×3): 40 mg via ORAL
  Filled 2018-09-17 (×3): qty 1

## 2018-09-17 MED ORDER — PREDNISONE 20 MG PO TABS
40.0000 mg | ORAL_TABLET | Freq: Every day | ORAL | Status: DC
  Administered 2018-09-18: 40 mg via ORAL
  Filled 2018-09-17 (×2): qty 2

## 2018-09-17 MED ORDER — GUAIFENESIN-DM 100-10 MG/5ML PO SYRP: 5.0000 mL | ORAL_SOLUTION | ORAL | Status: DC | PRN

## 2018-09-17 MED ORDER — DOXYCYCLINE HYCLATE 100 MG PO TABS
100.0000 mg | ORAL_TABLET | Freq: Two times a day (BID) | ORAL | Status: DC
  Administered 2018-09-17 – 2018-09-18 (×3): 100 mg via ORAL
  Filled 2018-09-17 (×2): qty 1

## 2018-09-17 MED ORDER — GABAPENTIN 300 MG PO CAPS
300.0000 mg | ORAL_CAPSULE | Freq: Two times a day (BID) | ORAL | Status: DC
  Administered 2018-09-17 – 2018-09-18 (×3): 300 mg via ORAL
  Filled 2018-09-17 (×3): qty 1

## 2018-09-17 MED ORDER — AMLODIPINE BESYLATE 5 MG PO TABS
5.0000 mg | ORAL_TABLET | Freq: Every day | ORAL | Status: DC
  Administered 2018-09-17 – 2018-09-18 (×2): 5 mg via ORAL
  Filled 2018-09-17 (×2): qty 1

## 2018-09-17 MED ORDER — IPRATROPIUM-ALBUTEROL 0.5-2.5 (3) MG/3ML IN SOLN
3.0000 mL | Freq: Four times a day (QID) | RESPIRATORY_TRACT | Status: DC
  Administered 2018-09-17 – 2018-09-18 (×5): 3 mL via RESPIRATORY_TRACT
  Filled 2018-09-17 (×5): qty 3

## 2018-09-17 MED ORDER — BUDESONIDE 0.25 MG/2ML IN SUSP
0.2500 mg | Freq: Two times a day (BID) | RESPIRATORY_TRACT | Status: DC
  Administered 2018-09-17 – 2018-09-18 (×3): 0.25 mg via RESPIRATORY_TRACT
  Filled 2018-09-17 (×7): qty 2

## 2018-09-17 MED ORDER — METHYLPREDNISOLONE SODIUM SUCC 40 MG IJ SOLR
40.0000 mg | Freq: Once | INTRAMUSCULAR | Status: AC
  Administered 2018-09-17: 40 mg via INTRAVENOUS
  Filled 2018-09-17: qty 1

## 2018-09-17 MED ORDER — INSULIN ASPART 100 UNIT/ML ~~LOC~~ SOLN
0.0000 [IU] | Freq: Three times a day (TID) | SUBCUTANEOUS | Status: DC
  Administered 2018-09-17: 2 [IU] via SUBCUTANEOUS
  Administered 2018-09-17: 3 [IU] via SUBCUTANEOUS
  Filled 2018-09-17: qty 1

## 2018-09-17 MED ORDER — TAMSULOSIN HCL 0.4 MG PO CAPS
0.4000 mg | ORAL_CAPSULE | Freq: Every day | ORAL | Status: DC
  Administered 2018-09-17: 0.4 mg via ORAL
  Filled 2018-09-17: qty 1

## 2018-09-17 MED ORDER — ENOXAPARIN SODIUM 40 MG/0.4ML ~~LOC~~ SOLN
40.0000 mg | SUBCUTANEOUS | Status: DC
  Administered 2018-09-18: 40 mg via SUBCUTANEOUS
  Filled 2018-09-17: qty 0.4

## 2018-09-17 MED ORDER — METHYLPREDNISOLONE SODIUM SUCC 40 MG IJ SOLR
40.0000 mg | Freq: Four times a day (QID) | INTRAMUSCULAR | Status: AC
  Administered 2018-09-17 (×3): 40 mg via INTRAVENOUS
  Filled 2018-09-17 (×3): qty 1

## 2018-09-17 NOTE — Progress Notes (Signed)
OT Cancellation Note  Patient Details Name: Bryan [name] MRN: [id number] DOB: May 09, 1956   Cancelled Treatment:    Reason Eval/Treat Not Completed: OT screened, no needs identified, will sign off. Pt is at baseline independence for ADL completion. No further OT services required at this time.   Guadelupe Sabin, OTR/L  [phone] 09/17/2018, 4:08 PM

## 2018-09-17 NOTE — Care Management Obs Status (Signed)
[name] NOTIFICATION   Patient Details  Name: Bryan [name] MRN: [id number] Date of Birth: 1956-01-26   Medicare Observation Status Notification Given:  Yes    Shelda Altes 09/17/2018, 11:37 AM

## 2018-09-17 NOTE — Evaluation (Signed)
Physical Therapy Evaluation Patient Details Name: Bryan [name] MRN: [id number] DOB: 14-Jan-1956 Today's Date: 09/17/2018   History of Present Illness  Bryan [name] is a 63 y.o. male with medical history significant of acid reflux, arthritis, asthma, chronic back pain, COPD, type 2 diabetes, hypertension, history of pneumonia, history of TIA, tubular adenoma who is coming to the emergency department with complaints of sharp central chest pain that started around 1500 today while he was watching a game on TV.  He describes pain as central, sharp, non-radiated, worsened by inspiration.  He states that less than an hour later he became short of breath and started wheezing.  He denies fever, chills, sore throat, rhinorrhea, hemoptysis, nausea, emesis, palpitations, diaphoresis, PND, orthopnea or recent pitting edema of the lower extremities.  He denies abdominal pain, diarrhea, melena or hematochezia.  He gets occasional constipation.  He complains of frequent nocturia 4-5 times a night, but denies dysuria, frequency or hematuria.  No polyuria, polydipsia, polyphagia or blurred vision.  He states his CBGs have been good at home.    Clinical Impression  Patient functioning at baseline for functional mobility and gait, other than requiring supplemental O2 while walking due to O2 desaturation to 82% - 87% on room air.  Patient put back on 3 LPM with O2 saturation increasing to 90-92% when walking back to room.  Patient given O2 cannula extender and encouraged to ambulate in room and stay out of bed as much as tolerable.  Plan:  Patient discharged from physical therapy to care of nursing for ambulation daily as tolerated for length of stay.    Follow Up Recommendations No PT follow up    Equipment Recommendations  None recommended by PT    Recommendations for Other Services       Precautions / Restrictions Precautions Precautions: None Restrictions Weight Bearing Restrictions: No      Mobility  Bed  Mobility Overal bed mobility: Independent                Transfers Overall transfer level: Independent                  Ambulation/Gait Ambulation/Gait assistance: Modified independent (Device/Increase time) Gait Distance (Feet): 175 Feet Assistive device: None;IV Pole Gait Pattern/deviations: WFL(Within Functional Limits) Gait velocity: slightly decreased due to SOB   General Gait Details: no loss of balance without AD or pushing IV pole  Stairs            Wheelchair Mobility    Modified Rankin (Stroke Patients Only)       Balance Overall balance assessment: No apparent balance deficits (not formally assessed)                                           Pertinent Vitals/Pain Pain Assessment: 0-10 Pain Score: 5  Pain Location: radiation down BLE possibly from back per patient (history of back surgery) Pain Descriptors / Indicators: Tightness Pain Intervention(s): Limited activity within patient's tolerance;Monitored during session    Home Living Family/patient expects to be discharged to:: Private residence Living Arrangements: Children Available Help at Discharge: Family Type of Home: House Home Access: Stairs to enter Entrance Stairs-Rails: None Entrance Stairs-Number of Steps: 1 Home Layout: One level Home Equipment: Cane - single point      Prior Function Level of Independence: Independent with assistive device(s)  Comments: community ambulator with [facility] PRN     Hand Dominance   Dominant Hand: Right    Extremity/Trunk Assessment   Upper Extremity Assessment Upper Extremity Assessment: Overall WFL for tasks assessed    Lower Extremity Assessment Lower Extremity Assessment: Overall WFL for tasks assessed    Cervical / Trunk Assessment Cervical / Trunk Assessment: Normal  Communication   Communication: No difficulties  Cognition Arousal/Alertness: Awake/alert Behavior During Therapy: WFL for tasks  assessed/performed Overall Cognitive Status: Within Functional Limits for tasks assessed                                        General Comments      Exercises     Assessment/Plan    PT Assessment Patent does not need any further PT services  PT Problem List         PT Treatment Interventions      PT Goals (Current goals can be found in the Care Plan section)  Acute Rehab PT Goals Patient Stated Goal: return home PT Goal Formulation: With patient/family Time For Goal Achievement: 09/17/18 Potential to Achieve Goals: Good    Frequency     Barriers to discharge        Co-evaluation               AM-PAC PT "6 Clicks" Mobility  Outcome Measure Help needed turning from your back to your side while in a flat bed without using bedrails?: None Help needed moving from lying on your back to sitting on the side of a flat bed without using bedrails?: None Help needed moving to and from a bed to a chair (including a wheelchair)?: None Help needed standing up from a chair using your arms (e.g., wheelchair or bedside chair)?: None Help needed to walk in hospital room?: None Help needed climbing 3-5 steps with a railing? : None 6 Click Score: 24    End of Session Equipment Utilized During Treatment: Oxygen Activity Tolerance: Patient tolerated treatment well;Patient limited by fatigue(limited by O2 desaturation) Patient left: in bed;with call bell/phone within reach;with family/visitor present(seated at bedside) Nurse Communication: Mobility status PT Visit Diagnosis: Unsteadiness on feet (R26.81);Other abnormalities of gait and mobility (R26.89);Muscle weakness (generalized) (M62.81)    Time: 3403-5248 PT Time Calculation (min) (ACUTE ONLY): 23 min   Charges:   PT Evaluation $PT Eval Moderate Complexity: 1 Mod PT Treatments $Therapeutic Activity: 23-37 mins        4:01 PM, 09/17/18 Lonell Grandchild, MPT Physical Therapist with [facility] [phone] office [phone] mobile phone

## 2018-09-17 NOTE — Progress Notes (Signed)
[facility]                                                                              Patient Demographics  Bryan [name], is a 63 y.o. male, DOB - [birth year], KMM:[id number]  Admit date - 09/16/2018   Admitting Physician No admitting provider for patient encounter.  Outpatient Primary MD for the patient is Rosita Fire, MD  Outpatient specialists:   LOS - 0  days   Medical records reviewed and are as summarized below:    Chief Complaint  Patient presents with  . Chest Pain       Brief summary   Patient is a 63 year old male with GERD, arthritis, COPD, diabetes type 2, hypertension, history of TIA presented to ED with sharp central chest pain that started around 1500 on the day of admission while watching the game on TV.  Reported nonradiating, worsened with inspiration, less than an hour later he became short of breath and wheezing. In ED, received continuous neb, Solu-Medrol 125 mg IV x1, Toradol and doxycycline.   Assessment & Plan    Principal Problem: Acute COPD exacerbation ([place]) -Presented with pleuritic chest pain with diffuse expiratory wheezing, mild tachypnea and very tight on admission -Patient received prolonged albuterol nebs, Solu-Medrol, Toradol and doxycycline -Troponins x2 neg, EKG with no ST-T wave changes -CT angiogram of the chest showed no evidence of PE, pneumonia or pulmonary edema. -Continue scheduled nebs, doxycycline, IV Solu-Medrol, will transition to oral prednisone in a.m. -Still diminished breath sounds throughout, patient on Trelegy at home, will continue at discharge.  For now continue Pulmicort, Brovana -Flutter valve, O2 as tolerated, not on home O2  Active Problems:   GERD -Continue PPI    Hypertension -Continue amlodipine    Constipation -Continue Linzess    Type 2 diabetes mellitus with hyperlipidemia (HCC) -Follow hemoglobin A1c, continue sliding scale insulin, follow closely with steroids  Acute on CKD  (chronic kidney disease), stage III (HCC) -Baseline creatinine 1.3-1.4, presented with creatinine of 1.7 at the time of admission -Continue IV hydration, creatinine improving    Hypokalemia -Resolved    Anemia, chronic iron deficiency -Hemoglobin at baseline    BPH (benign prostatic hyperplasia) Increased Flomax to twice daily, follow with urology outpatient  Code Status: Full code DVT Prophylaxis:  Lovenox  Family Communication: Discussed in detail with the patient, all imaging results, lab results explained to the patient and family member at the bedside   Disposition Plan: Hopefully DC home tomorrow if wheezing has improved  Time Spent in minutes 35 minutes  Procedures:  None  Consultants:   None  Antimicrobials:   Doxycycline 1/5   Medications  Scheduled Meds: . amLODipine  5 mg Oral Daily  . [START ON 09/18/2018] enoxaparin (LOVENOX) injection  40 mg Subcutaneous Q24H  . gabapentin  300 mg Oral BID  . guaiFENesin  600 mg Oral BID  . insulin aspart  0-20 Units Subcutaneous TID WC  . ipratropium-albuterol  3 mL Nebulization Q6H  . methylPREDNISolone (SOLU-MEDROL) injection  40 mg Intravenous Q6H   Followed by  . [START ON 09/18/2018] predniSONE  40 mg Oral Q breakfast  .  pantoprazole  40 mg Oral BID AC  . tamsulosin  0.4 mg Oral Daily   Continuous Infusions: . sodium chloride 250 mL/hr at 09/16/18 2238  . 0.9 % NaCl with KCl 20 mEq / L 100 mL/hr at 09/17/18 0046   PRN Meds:.acetaminophen **OR** acetaminophen, linaclotide   Antibiotics   Anti-infectives (From admission, onward)   Start     Dose/Rate Route Frequency Ordered Stop   09/16/18 2115  doxycycline (VIBRA-TABS) tablet 100 mg     100 mg Oral  Once 09/16/18 2108 09/16/18 2238        Subjective:   Bryan [name] was seen and examined today.  Chest pain improving, wheezing better from the time of admission, still diminished breath sounds throughout.  Afebrile. Patient denies dizziness, abdominal pain,  N/V/D/C, new weakness, numbess, tingling. No acute events overnight.    Objective:   Vitals:   09/17/18 0700 09/17/18 0715 09/17/18 0730 09/17/18 0837  BP: 131/76  133/73   Pulse: 96 (!) 104 (!) 109   Resp: (!) 24 (!) 25 (!) 26   Temp:      TempSrc:      SpO2: 95% 96% 94% 94%  Weight:      Height:        Intake/Output Summary (Last 24 hours) at 09/17/2018 0847 Last data filed at 09/17/2018 0138 Gross per 24 hour  Intake 1067.23 ml  Output -  Net 1067.23 ml     Wt Readings from Last 3 Encounters:  09/16/18 57.2 kg  08/15/18 57.7 kg  06/29/18 55 kg     Exam  General: Alert and oriented x 3, NAD  Eyes: PERRLA, EOMI, Anicteric Sclera,  HEENT:  Atraumatic, normocephalic  Cardiovascular: S1 S2 auscultated,  Regular rate and rhythm.  Respiratory: Diminished breath sound with scattered wheezing  Gastrointestinal: Soft, nontender, nondistended, + bowel sounds  Ext: no pedal edema bilaterally  Neuro: No new deficits  Musculoskeletal: No digital cyanosis, clubbing  Skin: No rashes  Psych: Normal affect and demeanor, alert and oriented x3    Data Reviewed:  I have personally reviewed following labs and imaging studies  Micro Results No results found for this or any previous visit (from the past 240 hour(s)).  Radiology Reports Ct Angio Chest Pe W And/or Wo Contrast  Result Date: 09/16/2018 CLINICAL DATA:  Intermittent shortness of breath and chest pressure and pain for 1 month. Symptoms increased over the last several days. EXAM: CT ANGIOGRAPHY CHEST WITH CONTRAST TECHNIQUE: Multidetector CT imaging of the chest was performed using the standard protocol during bolus administration of intravenous contrast. Multiplanar CT image reconstructions and MIPs were obtained to evaluate the vascular anatomy. CONTRAST:  55m ISOVUE-370 IOPAMIDOL (ISOVUE-370) INJECTION 76% COMPARISON:  Current chest radiograph.  Chest CTA, 06/04/2018. FINDINGS: Cardiovascular: Satisfactory  opacification of the pulmonary arteries to the segmental level. No evidence of pulmonary embolism. Normal heart size. No pericardial effusion. Mild left coronary artery calcifications. Great vessels normal in caliber. Mild noncalcified atherosclerosis along the descending thoracic aorta aortic arch branch vessels are widely patent. Mediastinum/Nodes: No enlarged mediastinal, hilar, or axillary lymph nodes. Thyroid gland, trachea, and esophagus demonstrate no significant findings. Lungs/Pleura: Mild centrilobular emphysema. Minor scarring in the left upper lobe, stable from the prior CT. Linear opacities in the right lower lobe consistent with a combination of mild scarring and subsegmental atelectasis. Mild stable scarring in the medial left lower lobe. No lung consolidation to suggest pneumonia. No pulmonary edema. No lung mass or suspicious nodule. No pleural effusion  or pneumothorax. Upper Abdomen: Unremarkable. Musculoskeletal: No fracture or acute finding. No osteoblastic or osteolytic lesions. Review of the MIP images confirms the above findings. IMPRESSION: 1. No evidence of a pulmonary embolism. 2. No acute findings.  No pneumonia or pulmonary edema. 3. Mild areas of lung scarring. Centrilobular emphysema, stable from the prior CT. 4. Mild aortic atherosclerosis. Mild left coronary artery calcifications. Aortic Atherosclerosis (ICD10-I70.0) and Emphysema (ICD10-J43.9). Electronically Signed   By: Lajean Manes M.D.   On: 09/16/2018 20:11   Dg Chest Portable 1 View  Result Date: 09/16/2018 CLINICAL DATA:  Shortness of breath. Chest pain for an hour. History of COPD. EXAM: PORTABLE CHEST 1 VIEW COMPARISON:  Chest radiograph June 26, 2018 FINDINGS: Cardiomediastinal silhouette is normal. No pleural effusions or focal consolidations. Trachea projects midline and there is no pneumothorax. Soft tissue planes and included osseous structures are non-suspicious. ACDF. IMPRESSION: No acute cardiopulmonary  process. Electronically Signed   By: Elon Alas M.D.   On: 09/16/2018 18:33    Lab Data:  CBC: Recent Labs  Lab 09/16/18 1820 09/17/18 0723  WBC 13.0* 10.7*  NEUTROABS  --  10.1*  HGB 12.3* 11.6*  HCT 39.2 37.8*  MCV 86.2 88.1  PLT 245 615   Basic Metabolic Panel: Recent Labs  Lab 09/16/18 1820 09/17/18 0723  NA 139 140  K 3.4* 4.6  CL 105 112*  CO2 28 21*  GLUCOSE 147* 177*  BUN 14 20  CREATININE 1.77* 1.48*  CALCIUM 8.7* 8.5*  MG 1.7  --   PHOS 3.0  --    GFR: Estimated Creatinine Clearance: 41.6 mL/min (A) (by C-G formula based on SCr of 1.48 mg/dL (H)). Liver Function Tests: No results for input(s): AST, ALT, ALKPHOS, BILITOT, PROT, ALBUMIN in the last 168 hours. No results for input(s): LIPASE, AMYLASE in the last 168 hours. No results for input(s): AMMONIA in the last 168 hours. Coagulation Profile: No results for input(s): INR, PROTIME in the last 168 hours. Cardiac Enzymes: Recent Labs  Lab 09/17/18 0104 09/17/18 0723  TROPONINI <0.03 <0.03   BNP (last 3 results) No results for input(s): PROBNP in the last 8760 hours. HbA1C: No results for input(s): HGBA1C in the last 72 hours. CBG: No results for input(s): GLUCAP in the last 168 hours. Lipid Profile: No results for input(s): CHOL, HDL, LDLCALC, TRIG, CHOLHDL, LDLDIRECT in the last 72 hours. Thyroid Function Tests: No results for input(s): TSH, T4TOTAL, FREET4, T3FREE, THYROIDAB in the last 72 hours. Anemia Panel: No results for input(s): VITAMINB12, FOLATE, FERRITIN, TIBC, IRON, RETICCTPCT in the last 72 hours. Urine analysis:    Component Value Date/Time   COLORURINE YELLOW 06/04/2018 [place] 06/04/2018 1231   LABSPEC 1.012 06/04/2018 1231   PHURINE 6.0 06/04/2018 1231   GLUCOSEU NEGATIVE 06/04/2018 1231   HGBUR NEGATIVE 06/04/2018 1231   BILIRUBINUR NEGATIVE 06/04/2018 1231   KETONESUR NEGATIVE 06/04/2018 1231   PROTEINUR NEGATIVE 06/04/2018 1231    UROBILINOGEN 0.2 07/08/2015 1314   NITRITE NEGATIVE 06/04/2018 1231   LEUKOCYTESUR NEGATIVE 06/04/2018 1231       M.D. [facility] 09/17/2018, 8:47 AM  Pager: [phone] Between 7am to 7pm - call Pager - [phone]  After 7pm go to [email or web address] - password [email or web address]  Call night coverage person covering after 7pm

## 2018-09-18 DIAGNOSIS — I10 Essential (primary) hypertension: Secondary | ICD-10-CM

## 2018-09-18 DIAGNOSIS — K5901 Slow transit constipation: Secondary | ICD-10-CM

## 2018-09-18 DIAGNOSIS — N401 Enlarged prostate with lower urinary tract symptoms: Secondary | ICD-10-CM | POA: Diagnosis not present

## 2018-09-18 DIAGNOSIS — K210 Gastro-esophageal reflux disease with esophagitis: Secondary | ICD-10-CM

## 2018-09-18 DIAGNOSIS — N183 Chronic kidney disease, stage 3 (moderate): Secondary | ICD-10-CM | POA: Diagnosis not present

## 2018-09-18 DIAGNOSIS — J441 Chronic obstructive pulmonary disease with (acute) exacerbation: Secondary | ICD-10-CM | POA: Diagnosis not present

## 2018-09-18 LAB — BASIC METABOLIC PANEL
Anion gap: 7 (ref 5–15)
BUN: 23 mg/dL (ref 8–23)
CO2: 22 mmol/L (ref 22–32)
Calcium: 8.6 mg/dL — ABNORMAL LOW (ref 8.9–10.3)
Chloride: 109 mmol/L (ref 98–111)
Creatinine, Ser: 1.41 mg/dL — ABNORMAL HIGH (ref 0.61–1.24)
GFR calc non Af Amer: 53 mL/min — ABNORMAL LOW (ref >60)
Glucose, Bld: 133 mg/dL — ABNORMAL HIGH (ref 70–99)
Potassium: 4.6 mmol/L (ref 3.5–5.1)
SODIUM: 138 mmol/L (ref 135–145)

## 2018-09-18 LAB — CBC
HCT: 38.9 % — ABNORMAL LOW (ref 39.0–52.0)
Hemoglobin: 12 g/dL — ABNORMAL LOW (ref 13.0–17.0)
MCH: 27.4 pg (ref 26.0–34.0)
MCHC: 30.8 g/dL (ref 30.0–36.0)
MCV: 88.8 fL (ref 80.0–100.0)
Platelets: 241 10*3/uL (ref 150–400)
RBC: 4.38 MIL/uL (ref 4.22–5.81)
RDW: 17.3 % — ABNORMAL HIGH (ref 11.5–15.5)
WBC: 21.3 10*3/uL — ABNORMAL HIGH (ref 4.0–10.5)
nRBC: 0 % (ref 0.0–0.2)

## 2018-09-18 LAB — GLUCOSE, CAPILLARY
GLUCOSE-CAPILLARY: 104 mg/dL — AB (ref 70–99)
Glucose-Capillary: 111 mg/dL — ABNORMAL HIGH (ref 70–99)

## 2018-09-18 LAB — HEMOGLOBIN A1C
Hgb A1c MFr Bld: 6.6 % — ABNORMAL HIGH (ref 4.8–5.6)
Mean Plasma Glucose: 142.72 mg/dL

## 2018-09-18 MED ORDER — TAMSULOSIN HCL 0.4 MG PO CAPS: 0.4000 mg | ORAL_CAPSULE | Freq: Every day | ORAL | Status: DC

## 2018-09-18 MED ORDER — IPRATROPIUM-ALBUTEROL 0.5-2.5 (3) MG/3ML IN SOLN: 3.0000 mL | Freq: Four times a day (QID) | RESPIRATORY_TRACT | Status: DC | PRN

## 2018-09-18 MED ORDER — TAMSULOSIN HCL 0.4 MG PO CAPS: 0.4000 mg | ORAL_CAPSULE | Freq: Every day | ORAL | 3 refills | Status: DC

## 2018-09-18 MED ORDER — PREDNISONE 10 MG PO TABS: ORAL_TABLET | ORAL | 0 refills | Status: DC

## 2018-09-18 MED ORDER — DOXYCYCLINE HYCLATE 100 MG PO TABS: 100.0000 mg | ORAL_TABLET | Freq: Two times a day (BID) | ORAL | 0 refills | Status: AC

## 2018-09-18 MED ORDER — GUAIFENESIN-DM 100-10 MG/5ML PO SYRP: 5.0000 mL | ORAL_SOLUTION | ORAL | 0 refills | Status: DC | PRN

## 2018-09-18 MED ORDER — FINASTERIDE 5 MG PO TABS: 5.0000 mg | ORAL_TABLET | Freq: Every day | ORAL | 3 refills | Status: DC

## 2018-09-18 MED ORDER — FINASTERIDE 5 MG PO TABS
5.0000 mg | ORAL_TABLET | Freq: Every day | ORAL | Status: DC
  Filled 2018-09-18 (×2): qty 1

## 2018-09-18 NOTE — Discharge Summary (Signed)
Physician Discharge Summary   Patient ID: Bryan [name] MRN: [id number] DOB/AGE: May 12, 1956 63 y.o.  Admit date: 09/16/2018 Discharge date: 09/18/2018  Primary Care Physician:  Rosita Fire, MD   Recommendations for Outpatient Follow-up:  1. Follow up with PCP in 1-2 weeks 2. Patient placed on prednisone taper, recommended to continue duo nebs 3 times a day for next 3 days and then per his schedule  Home Health: None  Equipment/Devices: Home O2 evaluation prior to discharge.  Discharge Condition: stable CODE STATUS: FULL   Diet recommendation: Carb modified diet   Discharge Diagnoses:    . Acute COPD exacerbation ([place]) . Anemia of chronic disease, iron deficiency . BPH (benign prostatic hyperplasia) . CKD (chronic kidney disease), stage III ([place]) . Constipation . GERD . Hypokalemia . Hypertension . Type 2 diabetes mellitus with hyperlipidemia (HCC)   Consults: None    Allergies:  No Known Allergies   DISCHARGE MEDICATIONS: Allergies as of 09/18/2018   No Known Allergies     Medication List    TAKE these medications   albuterol 108 (90 Base) MCG/ACT inhaler Commonly known as:  PROVENTIL HFA;VENTOLIN HFA Inhale 1-2 puffs every 4 (four) hours as needed into the lungs for wheezing or shortness of breath. Do not use with nebulizer What changed:  how much to take   albuterol (2.5 MG/3ML) 0.083% nebulizer solution Commonly known as:  PROVENTIL Take 3 mLs (2.5 mg total) by nebulization every 4 (four) hours as needed for wheezing or shortness of breath. What changed:  Another medication with the same name was changed. Make sure you understand how and when to take each.   amLODipine 5 MG tablet Commonly known as:  NORVASC Take 1 tablet (5 mg total) by mouth daily.   doxycycline 100 MG tablet Commonly known as:  VIBRA-TABS Take 1 tablet (100 mg total) by mouth 2 (two) times daily for 7 days.   finasteride 5 MG tablet Commonly known as:  PROSCAR Take 1 tablet (5  mg total) by mouth daily.   Fluticasone-Umeclidin-Vilant 100-62.5-25 MCG/INH Aepb Commonly known as:  TRELEGY ELLIPTA Inhale 1 puff into the lungs daily.   gabapentin 300 MG capsule Commonly known as:  NEURONTIN Take 1 capsule (300 mg total) by mouth 2 (two) times daily. Pt. Says he is taking twice daily. What changed:  additional instructions   guaiFENesin 600 MG 12 hr tablet Commonly known as:  MUCINEX Take 1 tablet (600 mg total) by mouth 2 (two) times daily. What changed:    when to take this  reasons to take this   guaiFENesin-dextromethorphan 100-10 MG/5ML syrup Commonly known as:  ROBITUSSIN DM Take 5 mLs by mouth every 4 (four) hours as needed for cough (chest congestion).   linaclotide 72 MCG capsule Commonly known as:  LINZESS Take 1 capsule (72 mcg total) by mouth daily before breakfast. What changed:    when to take this  reasons to take this   pantoprazole 40 MG tablet Commonly known as:  PROTONIX Take 1 tablet (40 mg total) by mouth 2 (two) times daily before a meal.   predniSONE 10 MG tablet Commonly known as:  DELTASONE Prednisone dosing: Take  Prednisone 33m (4 tabs) x 2 days, then taper to 371m(3 tabs) x 3 days, then 2054m2 tabs) x 3days, then 60m75m tab) x 3days, then OFF. Start taking on:  September 19, 2018   tamsulosin 0.4 MG Caps capsule Commonly known as:  FLOMAX Take 1 capsule (0.4 mg total) by  mouth daily after supper. What changed:  when to take this        Brief H and P: For complete details please refer to admission H and P, but in brief Patient is a 63 year old male with GERD, arthritis, COPD, diabetes type 2, hypertension, history of TIA presented to ED with sharp central chest pain that started around 1500 on the day of admission while watching the game on TV.  Reported nonradiating, worsened with inspiration, less than an hour later he became short of breath and wheezing. In ED, received continuous neb, Solu-Medrol 125 mg IV x1,  Toradol and doxycycline.   Hospital Course:   Acute COPD exacerbation ([place]) -Presented with pleuritic chest pain with diffuse expiratory wheezing, mild tachypnea and very tight on admission -Patient received prolonged albuterol nebs, Solu-Medrol, Toradol and doxycycline in ED -Troponins neg, EKG with no ST-T wave changes -CT angiogram of the chest showed no evidence of PE, pneumonia or pulmonary edema. -Patient was placed on scheduled nebs, doxycycline and IV Solu-Medrol.  He has been transitioned to oral prednisone.   -Feeling close to his baseline, continue prednisone taper, doxycycline for 7 days, Trelegy inhaler, scheduled nebs for 3 days -Discussed with patient's pulmonologist, Dr. Luan Pulling, recommended patient to follow-up with the next 7 to 10 days -Home O2 evaluation will be done prior to discharge.     GERD -Continue PPI    Hypertension -Continue amlodipine    Constipation -Continue Linzess    Type 2 diabetes mellitus with hyperlipidemia (HCC) -CBGs remained stable, patient was placed on sliding scale insulin.  Acute on CKD (chronic kidney disease), stage III (HCC) -Baseline creatinine 1.3-1.4, presented with creatinine of 1.7 at the time of admission -Patient was placed on gentle hydration, creatinine back to baseline, 1.4 at the time of discharge.    Hypokalemia -Resolved, 4.6 at the time of discharge.    Anemia, chronic iron deficiency -Hemoglobin at baseline    BPH (benign prostatic hyperplasia) Continue Flomax, added finasteride, follow with urology outpatient   Day of Discharge S: Doing well, no significant wheezing, feels close to baseline.  BP 135/80 (BP Location: Left Arm)   Pulse 92   Temp 97.9 F (36.6 C) (Oral)   Resp 20   Ht _0  (1.6 m)   Wt 61 kg   SpO2 97%   BMI 23.82 kg/m   Physical Exam: General: Alert and awake oriented x3 not in any acute distress. HEENT: anicteric sclera, pupils reactive to light and accommodation CVS:  S1-S2 clear no murmur rubs or gallops Chest: clear to auscultation bilaterally, no wheezing rales or rhonchi Abdomen: soft nontender, nondistended, normal bowel sounds Extremities: no cyanosis, clubbing or edema noted bilaterally Neuro: Cranial nerves II-XII intact, no focal neurological deficits   The results of significant diagnostics from this hospitalization (including imaging, microbiology, ancillary and laboratory) are listed below for reference.      Procedures/Studies:  Ct Angio Chest Pe W And/or Wo Contrast  Result Date: 09/16/2018 CLINICAL DATA:  Intermittent shortness of breath and chest pressure and pain for 1 month. Symptoms increased over the last several days. EXAM: CT ANGIOGRAPHY CHEST WITH CONTRAST TECHNIQUE: Multidetector CT imaging of the chest was performed using the standard protocol during bolus administration of intravenous contrast. Multiplanar CT image reconstructions and MIPs were obtained to evaluate the vascular anatomy. CONTRAST:  76m ISOVUE-370 IOPAMIDOL (ISOVUE-370) INJECTION 76% COMPARISON:  Current chest radiograph.  Chest CTA, 06/04/2018. FINDINGS: Cardiovascular: Satisfactory opacification of the pulmonary arteries to the segmental level. No  evidence of pulmonary embolism. Normal heart size. No pericardial effusion. Mild left coronary artery calcifications. Great vessels normal in caliber. Mild noncalcified atherosclerosis along the descending thoracic aorta aortic arch branch vessels are widely patent. Mediastinum/Nodes: No enlarged mediastinal, hilar, or axillary lymph nodes. Thyroid gland, trachea, and esophagus demonstrate no significant findings. Lungs/Pleura: Mild centrilobular emphysema. Minor scarring in the left upper lobe, stable from the prior CT. Linear opacities in the right lower lobe consistent with a combination of mild scarring and subsegmental atelectasis. Mild stable scarring in the medial left lower lobe. No lung consolidation to suggest  pneumonia. No pulmonary edema. No lung mass or suspicious nodule. No pleural effusion or pneumothorax. Upper Abdomen: Unremarkable. Musculoskeletal: No fracture or acute finding. No osteoblastic or osteolytic lesions. Review of the MIP images confirms the above findings. IMPRESSION: 1. No evidence of a pulmonary embolism. 2. No acute findings.  No pneumonia or pulmonary edema. 3. Mild areas of lung scarring. Centrilobular emphysema, stable from the prior CT. 4. Mild aortic atherosclerosis. Mild left coronary artery calcifications. Aortic Atherosclerosis (ICD10-I70.0) and Emphysema (ICD10-J43.9). Electronically Signed   By: Lajean Manes M.D.   On: 09/16/2018 20:11   Dg Chest Portable 1 View  Result Date: 09/16/2018 CLINICAL DATA:  Shortness of breath. Chest pain for an hour. History of COPD. EXAM: PORTABLE CHEST 1 VIEW COMPARISON:  Chest radiograph June 26, 2018 FINDINGS: Cardiomediastinal silhouette is normal. No pleural effusions or focal consolidations. Trachea projects midline and there is no pneumothorax. Soft tissue planes and included osseous structures are non-suspicious. ACDF. IMPRESSION: No acute cardiopulmonary process. Electronically Signed   By: Elon Alas M.D.   On: 09/16/2018 18:33       LAB RESULTS: Basic Metabolic Panel: Recent Labs  Lab 09/16/18 1820 09/17/18 0723 09/18/18 0525  NA 139 140 138  K 3.4* 4.6 4.6  CL 105 112* 109  CO2 28 21* 22  GLUCOSE 147* 177* 133*  BUN _0 CREATININE 1.77* 1.48* 1.41*  CALCIUM 8.7* 8.5* 8.6*  MG 1.7  --   --   PHOS 3.0  --   --    Liver Function Tests: No results for input(s): AST, ALT, ALKPHOS, BILITOT, PROT, ALBUMIN in the last 168 hours. No results for input(s): LIPASE, AMYLASE in the last 168 hours. No results for input(s): AMMONIA in the last 168 hours. CBC: Recent Labs  Lab 09/17/18 0723 09/18/18 0525  WBC 10.7* 21.3*  NEUTROABS 10.1*  --   HGB 11.6* 12.0*  HCT 37.8* 38.9*  MCV 88.1 88.8  PLT 226 241    Cardiac Enzymes: Recent Labs  Lab 09/17/18 0104 09/17/18 0723  TROPONINI <0.03 <0.03   BNP: Invalid input(s): POCBNP CBG: Recent Labs  Lab 09/17/18 2220 09/18/18 0753  GLUCAP 155* 111*      Disposition and Follow-up: Discharge Instructions    Diet Carb Modified   Complete by:  As directed    Discharge instructions   Complete by:  As directed    Please continue Duonebs 3 times a day for next 3 days, then continue twice a day. Please use albuterol inhaler in between as you need for wheezing.   Increase activity slowly   Complete by:  As directed        DISPOSITION: Home   DISCHARGE FOLLOW-UP Follow-up Information    Rosita Fire, MD. Schedule an appointment as soon as possible for a visit in 2 week(s).   Specialty:  Internal Medicine Contact information: [address]  [place]        Sinda Du, MD. Schedule an appointment as soon as possible for a visit in 10 day(s).   Specialty:  Pulmonary Disease Why:  for hospital follow-up Contact information: [address] [place] [address] [phone]            Time coordinating discharge:  35 minutes  Signed:   Estill Cotta M.D. [facility] 09/18/2018, 8:48 AM Pager: [phone]

## 2018-09-18 NOTE — Care Management Note (Signed)
Case Management Note  Patient Details  Name: Bryan [name] MRN: [id number] Date of Birth: [birth year]  Subjective/Objective:  COPD. From home, ind with ADL's has insurance and PCP. Needs to DC with home oxygen.                   Action/Plan: DC home today. Pt has chosen [facility] for DME. Vaughan Basta, [facility] rep, given referral.   Expected Discharge Date:  09/18/18               Expected Discharge Plan:  Home/Self Care  In-House Referral:  NA  Discharge planning Services  CM Consult  Post Acute Care Choice:  Durable Medical Equipment Choice offered to:  NA  DME Arranged:  Other see comment DME Agency:  [place]:    [place] Agency:     Status of Service:  Completed, signed off  If discussed at [place] of Stay Meetings, dates discussed:    Additional Comments:  Sherald Barge, RN 09/18/2018, 12:24 PM

## 2018-09-18 NOTE — Progress Notes (Signed)
SATURATION QUALIFICATIONS: (This note is used to comply with regulatory documentation for home oxygen)  Patient Saturations on Room Air at Rest = 94%  Patient Saturations on Room Air while Ambulating = 83%  Patient Saturations on 3 Liters of oxygen while Ambulating = 97%  Please briefly explain why patient needs home oxygen:

## 2018-09-18 NOTE — Progress Notes (Signed)
Nutrition Brief Note  Dietitian consulted for assessment of status. He presents with COPD exacerbation, DM-2, Constipation, GERD acute on chronic CKD-3 and chronic iron deficiency anemia. He has a weight gain trend the past 5-6 months noted below.  Wt Readings from Last 15 Encounters:  09/17/18 61 kg  08/15/18 57.7 kg  06/29/18 55 kg  06/04/18 55.9 kg  05/08/18 54.4 kg  04/27/18 53.2 kg  04/14/18 53.2 kg  03/19/18 52.5 kg  03/12/18 52.5 kg  02/01/18 54.1 kg  08/17/17 53.1 kg  08/15/17 53.1 kg  08/09/17 52.6 kg  07/22/17 52.2 kg  07/16/17 51.3 kg    Body mass index is 23.82 kg/m. Patient meets criteria for normal based on current BMI.   Appetite is very good. Current diet order is CHO Modified  patient is consuming approximately 100% of meals at this time.  Labs and medications reviewed.  BMP Latest Ref Rng & Units 09/18/2018 09/17/2018 09/16/2018  Glucose 70 - 99 mg/dL 133(H) 177(H) 147(H)  BUN 8 - 23 mg/dL _0 Creatinine 0.61 - 1.24 mg/dL 1.41(H) 1.48(H) 1.77(H)  BUN/Creat Ratio 6 - 22 (calc) - - -  Sodium 135 - 145 mmol/L 138 140 139  Potassium 3.5 - 5.1 mmol/L 4.6 4.6 3.4(L)  Chloride 98 - 111 mmol/L 109 112(H) 105  CO2 22 - 32 mmol/L 22 21(L) 28  Calcium 8.9 - 10.3 mg/dL 8.6(L) 8.5(L) 8.7(L)    No nutrition interventions warranted.   Patient has responded well to treatment and is being discharged.    Colman Cater MS,RD,CSG,LDN Office: [phone] Pager: [phone]

## 2018-09-19 ENCOUNTER — Other Ambulatory Visit: Payer: Self-pay

## 2018-09-19 NOTE — Progress Notes (Signed)
Patient discharged home with instructions given on medications and follow up visits,patient verbalized understanding . Prescriptions sent with patient. IV catheter discontinued. Accompanied by staff to an awaiting vehicle.

## 2018-09-19 NOTE — Patient Outreach (Signed)
[facility]) Care Management  09/19/2018  Bryan [name] 1955-11-30 [id number]   Successful outreach with Bryan [name] following hospital discharge. He reported "feeling better" and denied complaints of chest pain, dizziness or shortness of breath since returning home yesterday. Reported using O2 at 3L/min as recommended and taking medications as prescribed. Reported O2 tank was provided at discharge but additional tanks were not delivered. RNCM placed call to [employer]. Per staff, order was received but pending confirmation that member was discharged. RNCM confirmed member's address and discharge date. Follow up call placed to Bryan [name] informing him of pending delivery between noon-4pm.   PLAN Will follow up on tomorrow.  [place] [phone]

## 2018-09-20 ENCOUNTER — Other Ambulatory Visit: Payer: Self-pay

## 2018-09-20 NOTE — Patient Outreach (Signed)
[place] [facility]) Care Management  09/20/2018  Bryan [name] Dec 03, 1955 [id number]    [place] [facility] [facility]) Care Management  09/20/2018  Bryan [name] [birth year] [id number]    Late entry Patient was admitted to the hospital on 06/26/2018 for COPD execerbation.  RN Health Coach will close the case.  [name] RN, BSN, [place] Direct Dial:  [phone]  Fax: [phone]

## 2018-09-20 NOTE — Patient Outreach (Signed)
[place] [facility]) Care Management  09/20/2018  Bryan [name] 07/10/1956 [id number]   Successful follow up with [name]. Confirmed delivery of 02 tanks and supplies. RNCM discussed safety precautions. Member denied changes since discussion on yesterday. He is scheduled for PCP follow up on 09/25/08. Will contact Dr. Miguel Rota office later today to schedule Pulmonology follow-up.  PLAN Will follow up in two weeks.  [place] [phone]

## 2018-09-21 ENCOUNTER — Ambulatory Visit: Payer: Self-pay

## 2018-09-28 ENCOUNTER — Other Ambulatory Visit: Payer: Self-pay

## 2018-09-28 NOTE — Patient Outreach (Signed)
[place] [facility]) Care Management  09/28/2018  TAISHAWN SMALDONE [birth year] [id number]   Call received from [place] at [facility]. Informed RNCM that Mr. Rann was scheduled for home visit later today. Discussed current plan of care and possible care needs since discharge. Agreed to follow up with [facility] after visit to discuss referrals and equipment needs.   [place] [phone]

## 2018-10-05 ENCOUNTER — Other Ambulatory Visit: Payer: Self-pay

## 2018-10-05 NOTE — Patient Outreach (Signed)
[place] [facility]) Care Management  10/05/2018  Bryan [name] 23-May-1956 [id number]   Follow-up regarding nebulizer. [name] reported no changes or worsening symptoms since last outreach. Confirmed that old nebulizer was received from [place]. Per [facility] staff, new nebulizer would be delivered after fax was received from PCP. Message relayed, and fax number provided to Dr. Josephine Cables assistant. RNCM will follow up to confirm receipt of equipment.  PLAN Will follow up next week.  [place] [phone]

## 2018-10-10 ENCOUNTER — Other Ambulatory Visit: Payer: Self-pay

## 2018-10-10 NOTE — Patient Outreach (Signed)
[facility]) Care Management  10/10/2018  Bryan [name] 10-21-1955 [id number]   Successful outreach. [name] confirmed receipt of new nebulizer. Denied urgent concerns or care management needs. Agreed to contact RNCM with concerns if needed.  PLAN Will continue routine outreach.  [place] [phone]

## 2018-10-20 DIAGNOSIS — J441 Chronic obstructive pulmonary disease with (acute) exacerbation: Secondary | ICD-10-CM | POA: Diagnosis not present

## 2018-10-29 ENCOUNTER — Other Ambulatory Visit: Payer: Self-pay

## 2018-10-29 IMAGING — DX DG CHEST 2V
2 series · 2 of 2 positions shown · non-contrast
Comparison: July 14, 2017

CLINICAL DATA: Chest pain.

EXAM:
CHEST  2 VIEW

[chest ap]
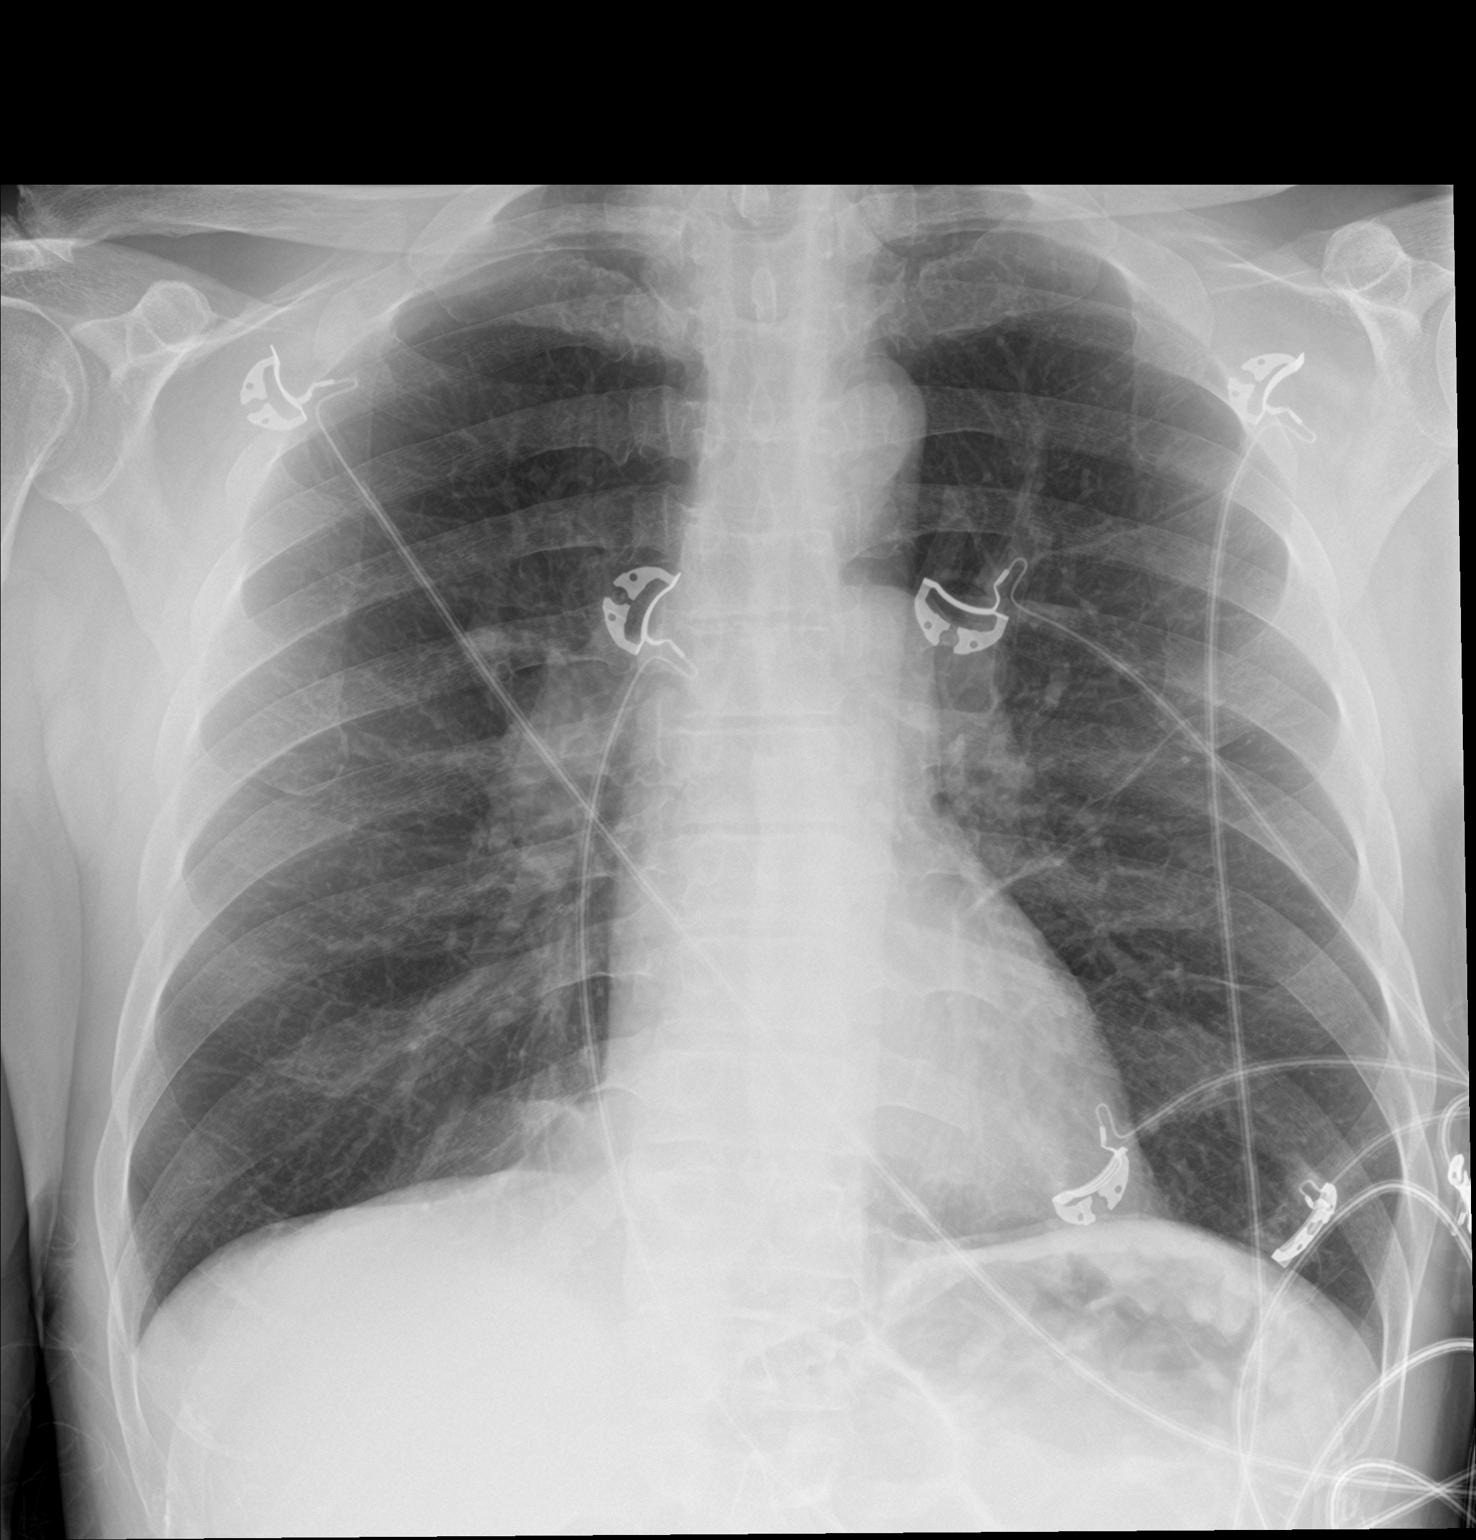

[chest lat]
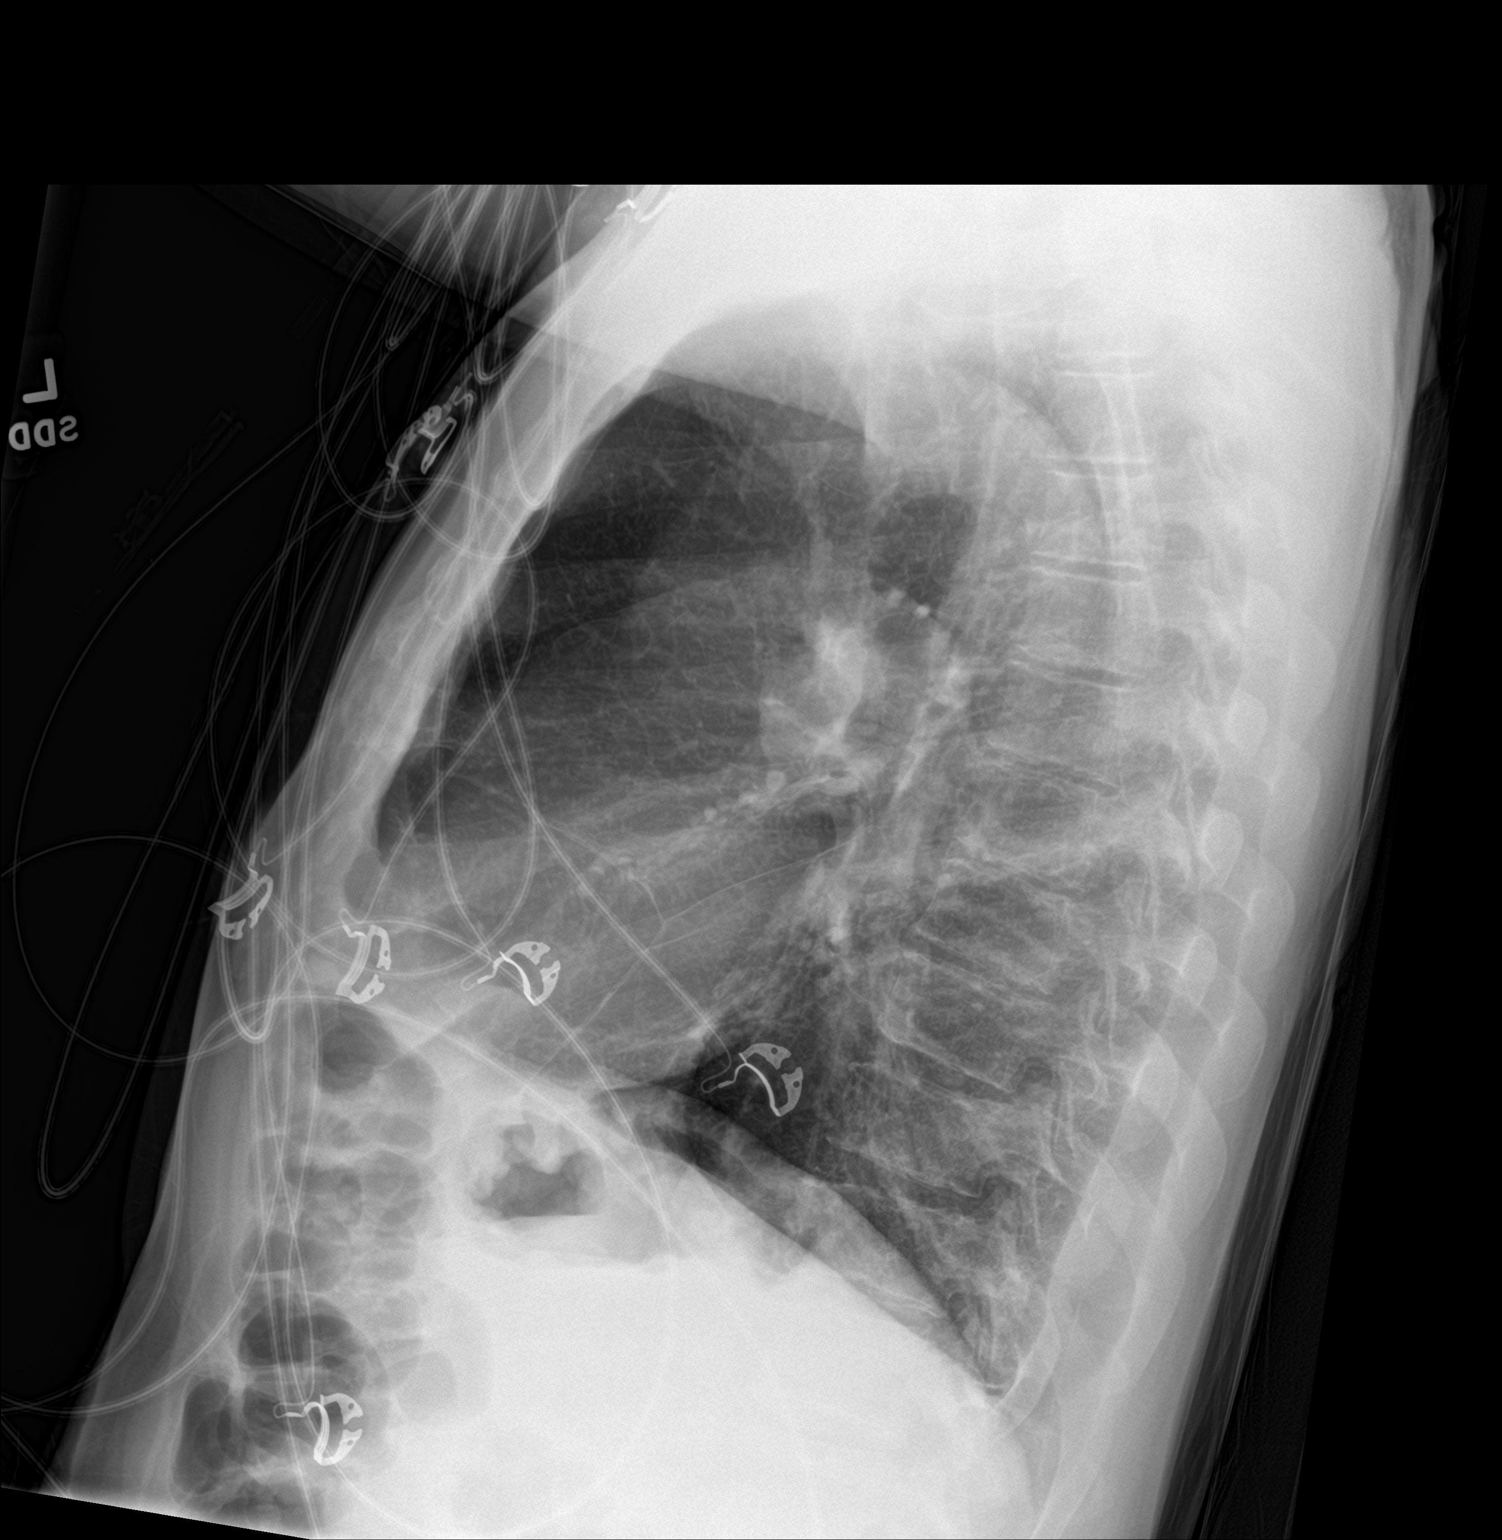

[2 of 2 positions shown; findings below may reference images not displayed]

FINDINGS: The heart, hila, and mediastinum are normal. No pneumothorax. No
nodules or masses. No focal infiltrates. No overt edema.
IMPRESSION: No active cardiopulmonary disease.

## 2018-10-29 NOTE — Patient Outreach (Signed)
[place] [facility]) Care Management  10/29/2018  Bryan [name] 1956/01/06 [id number]   Successful outreach with [name]. Member stated "I feel good." Reported compliance with medications and using home oxygen as prescribed. Reported increased activity tolerance and performing ADLs without difficulty. No complaints of shortness of breath at rest but reported occasional episodes with exertion. Mr. Fitterer denied immediate needs and will follow up with Dr. Luan Pulling on next month. Agreed to contact RNCM with questions and concerns as needed. PLAN Will continue routine outreach.  [place] [phone]

## 2018-11-06 DIAGNOSIS — J9611 Chronic respiratory failure with hypoxia: Secondary | ICD-10-CM | POA: Diagnosis not present

## 2018-11-06 DIAGNOSIS — I10 Essential (primary) hypertension: Secondary | ICD-10-CM | POA: Diagnosis not present

## 2018-11-06 DIAGNOSIS — J449 Chronic obstructive pulmonary disease, unspecified: Secondary | ICD-10-CM | POA: Diagnosis not present

## 2018-11-08 DIAGNOSIS — J449 Chronic obstructive pulmonary disease, unspecified: Secondary | ICD-10-CM | POA: Diagnosis not present

## 2018-11-13 NOTE — Progress Notes (Signed)
Referring Provider: Rosita Fire, MD Primary Care Physician:  Rosita Fire, MD Primary GI: Dr. Gala Romney   Chief Complaint  Patient presents with  . Dysphagia  . Abdominal Pain    "hurts bad" x 2 months    HPI:   Bryan [name] is a 63 y.o. male presenting today with a history of  chronic abdominal pain, constipation, GERD, esophagitis, dysphagia. He reported dysphagia despite empiric dilation in Sept 2018. Known esophagitis. BPE completed without motility issues. No stricture. Due to abdominal pain, HIDA completed and normal with EF 92%. CT without contrast May 2019 with wall thickening throughout sigmoid without focal inflammatory changes, possibly due to underdistension, unable to exclude low grade colitis. (last colonoscopy Sept 2018)I discussed with radiologist and low likelihood of infectious process. Pancreas unremarkable on non-contrast exam. He was hospitalized in July due to abdominal/chest pain. Negative cardiac markers.  115 July 2019, 120 Aug 2019, 127 in Dec 2019. Today is 134. Gaining weight.   Constipation: Started on Linzess 290 mcg at last visit. Linzess 72 mcg not effective in past. States Amitiza caused cramping. Linzess was 300$. When taking the samples, he had a good BM. Been out of Linzess for 2 days. Doesn't remember taking Miralax. Sometimes unproductive stool.   Abdominal pain: present for 2 months. feels like a knife is cutting him across his abdomen, doubles him over. Not precipitated by eating. Notices with activity that it flares more. Sometimes will be doing nothing and it flares. No diarrhea with pain.   CTA chest 4 mm nodule in left lower lobe Aug 2019. Recommend non-contrast chest CT in 12 months. History of smoking 1-1.5 ppd X 45 years, no smoking in 4 years. No family history of lung cancer.However, he recently had CTA chest Jan 2020 due to SOB and chest pain, without evidence of lung nodule.    Past Medical History:  Diagnosis Date  . Acid  reflux   . Arthritis   . Asthma   . Chronic pain    with leg and back pain (disc problem)  . COPD (chronic obstructive pulmonary disease) ([place])   . Diabetes mellitus without complication ([place])   . Headache    HX OF  . History of upper GI x-ray series    to follow showed large duodenal ulcer H pylori serologies were negative  . Hypertension   . Pneumonia    07/17/17  . Pre-diabetes   . Stroke [facility])    TIA MINI STROKE  . Tubular adenoma     Past Surgical History:  Procedure Laterality Date  . BACK SURGERY  7/05; 5/09    Dr.Hirsch,3 lumbar  X3  . BACK SURGERY    . COLONOSCOPY  Feb 2012   Dr. Gala Romney: normal rectum, pedunculate polyp removed but not recovered  . COLONOSCOPY WITH ESOPHAGOGASTRODUODENOSCOPY (EGD) N/A 11/18/2013   Dr.Rourk- tcs= normal rectum, multipe polyps about the ileocecal valve and distal transverse colon o/w the remainder of the colonic mucosa appeared normal bx= tubular adenoma. EGD= normal esophagus, stomach with scattered erosions mottling, friablility, no ulcer or infiltrating process patent pylorus bx= chronic inflammation. next TCS 11/2016  . COLONOSCOPY WITH PROPOFOL N/A 05/25/2017   Dr. Gala Romney: sigmoid diverticulosis, one 4 mm hyerplastic rectal polyp, ascending colonic AVMs surveillance 2023  . ESOPHAGOGASTRODUODENOSCOPY  1/06   Dr. Volney American esophageal erosions,U-shaped stomach,marked erosions and edema of the bulb without discrete ulcer disease.   . ESOPHAGOGASTRODUODENOSCOPY (EGD) WITH PROPOFOL N/A 05/25/2017   Dr. Gala Romney: reflux  esophagitis s/p empiric dilation, normal stomach and duodenum  . MALONEY DILATION N/A 05/25/2017   Procedure: Venia Minks DILATION;  Surgeon: Daneil Dolin, MD;  Location: [facility] END[facility] SUITE;  Service: Endoscopy;  Laterality: N/A;  . NECK SURGERY  10/2007   PLATE IN NECK  . POLYPECTOMY  05/25/2017   Procedure: POLYPECTOMY;  Surgeon: Daneil Dolin, MD;  Location: [facility] SUITE;  Service: Endoscopy;;  rectal  . TRANSURETHRAL RESECTION OF  PROSTATE N/A 08/17/2017   Procedure: TRANSURETHRAL RESECTION OF THE PROSTATE (TURP);  Surgeon: Irine Seal, MD;  Location: [facility] ORS;  Service: Urology;  Laterality: N/A;    Current Outpatient Medications  Medication Sig Dispense Refill  . albuterol (PROVENTIL HFA;VENTOLIN HFA) 108 (90 Base) MCG/ACT inhaler Inhale 1-2 puffs every 4 (four) hours as needed into the lungs for wheezing or shortness of breath. Do not use with nebulizer (Patient taking differently: Inhale 1 puff into the lungs every 4 (four) hours as needed for wheezing or shortness of breath. Do not use with nebulizer) 1 Inhaler 0  . albuterol (PROVENTIL) (2.5 MG/3ML) 0.083% nebulizer solution Take 3 mLs (2.5 mg total) by nebulization every 4 (four) hours as needed for wheezing or shortness of breath. 75 mL 0  . amLODipine (NORVASC) 5 MG tablet Take 1 tablet (5 mg total) by mouth daily. 30 tablet 11  . finasteride (PROSCAR) 5 MG tablet Take 1 tablet (5 mg total) by mouth daily. 30 tablet 3  . Fluticasone-Umeclidin-Vilant (TRELEGY ELLIPTA) 100-62.5-25 MCG/INH AEPB Inhale 1 puff into the lungs daily. 28 each 0  . gabapentin (NEURONTIN) 300 MG capsule Take 1 capsule (300 mg total) by mouth 2 (two) times daily. Pt. Says he is taking twice daily. (Patient taking differently: Take 300 mg by mouth 2 (two) times daily. )    . guaiFENesin (MUCINEX) 600 MG 12 hr tablet Take 1 tablet (600 mg total) by mouth 2 (two) times daily. 40 tablet 0  . linaclotide (LINZESS) 72 MCG capsule Take 1 capsule (72 mcg total) by mouth daily before breakfast. (Patient taking differently: Take 72 mcg by mouth as needed. ) 90 capsule 3  . pantoprazole (PROTONIX) 40 MG tablet Take 1 tablet (40 mg total) by mouth 2 (two) times daily before a meal. 60 tablet 5  . tamsulosin (FLOMAX) 0.4 MG CAPS capsule Take 1 capsule (0.4 mg total) by mouth daily after supper. 30 capsule 3   No current facility-administered medications for this visit.     Allergies as of 11/14/2018  . (No  Known Allergies)    Family History  Problem Relation Age of Onset  . Cancer Father   . Asthma Mother   . Colon cancer Neg Hx     Social History   Socioeconomic History  . Marital status: Single    Spouse name: Not on file  . Number of children: 2  . Years of education: Not on file  . Highest education level: Not on file  Occupational History  . Occupation: disabled    Fish farm manager: UNEMPLOYED  Social Needs  . Financial resource strain: Not on file  . Food insecurity:    Worry: Not on file    Inability: Not on file  . Transportation needs:    Medical: Not on file    Non-medical: Not on file  Tobacco Use  . Smoking status: Former Smoker    Packs/day: 0.50    Years: 40.00    Pack years: 20.00    Types: Cigarettes    Last attempt  to quit: 11/11/2014    Years since quitting: 4.0  . Smokeless tobacco: Never Used  Substance and Sexual Activity  . Alcohol use: No    Alcohol/week: 0.0 standard drinks  . Drug use: Not Currently    Types: Marijuana    Comment:  'every once in a while" ; denied 08/15/18  . Sexual activity: Not Currently    Birth control/protection: None  Lifestyle  . Physical activity:    Days per week: Not on file    Minutes per session: Not on file  . Stress: Not on file  Relationships  . Social connections:    Talks on phone: Not on file    Gets together: Not on file    Attends religious service: Not on file    Active member of club or organization: Not on file    Attends meetings of clubs or organizations: Not on file    Relationship status: Not on file  Other Topics Concern  . Not on file  Social History Narrative   Lives with girlfriend, is on disability for history of back injuries.    Review of Systems: Gen: Denies fever, chills, anorexia. Denies fatigue, weakness, weight loss.  CV: Denies chest pain, palpitations, syncope, peripheral edema, and claudication. Resp: Denies dyspnea at rest, cough, wheezing, coughing up blood, and pleurisy. GI:  see HPI Derm: Denies rash, itching, dry skin Psych: Denies depression, anxiety, memory loss, confusion. No homicidal or suicidal ideation.  Heme: Denies bruising, bleeding, and enlarged lymph nodes.  Physical Exam: BP 127/84   Pulse 94   Temp (!) 97.3 F (36.3 C) (Oral)   Ht _0  (1.6 m)   Wt 134 lb 3.2 oz (60.9 kg)   BMI 23.77 kg/m  General:   Alert and oriented. No distress noted. Pleasant and cooperative.  Head:  Normocephalic and atraumatic. Eyes:  Conjuctiva clear without scleral icterus. Mouth:  Oral mucosa pink and moist.  Abdomen:  +BS, soft, non-tender and non-distended. No rebound or guarding. No HSM or masses noted. Msk:  Symmetrical without gross deformities. Normal posture. Extremities:  Without edema. Neurologic:  Alert and  oriented x4 Psych:  Alert and cooperative. Normal mood and affect.

## 2018-11-14 ENCOUNTER — Ambulatory Visit: Payer: Medicare Other | Admitting: Gastroenterology

## 2018-11-14 ENCOUNTER — Encounter: Payer: Self-pay | Admitting: Gastroenterology

## 2018-11-14 VITALS — BP 127/84 | HR 94 | Temp 97.3°F | Ht 63.0 in | Wt 134.2 lb

## 2018-11-14 DIAGNOSIS — K5900 Constipation, unspecified: Secondary | ICD-10-CM | POA: Diagnosis not present

## 2018-11-14 NOTE — Progress Notes (Signed)
CC'D TO PCP °

## 2018-11-14 NOTE — Patient Instructions (Addendum)
Please get Miralax (polyethylene glycol) from over the counter. You can ask your pharmacist where this is in the store and any generic brands that may be more cost-effective. For now, take 1 capful each morning, mixed with your coffee. We may need to increase this to twice a day.  Please also get Benefiber and take this daily. I have provided a handout.   Please call if abdominal pain continues despite having a GOOD, productive bowel movement every day where you feel that you have emptied.   I will see you back in 3 months!  I enjoyed seeing you again today! As you know, I value our relationship and want to provide genuine, compassionate, and quality care. I welcome your feedback. If you receive a survey regarding your visit,  I greatly appreciate you taking time to fill this out. See you next time!  [name], PhD, ANP-BC [facility] Gastroenterology

## 2018-11-14 NOTE — Assessment & Plan Note (Signed)
Previously tried Amitiza, which caused stomach cramping. Linzess 290 mcg with solid BM but from patient's description was not ideally productive. Limited historian. Now with abdominal pain that is precipitated by moving, and with further discussion does not seem to be having productive stools, notes straining. No alarm signs/symptoms. He has actually been gaining weight. Constipation playing a role in abdominal pain and likely with musculoskeletal component as well.   As Linzess was too expensive, we will see if this needed a prior authorization. Would recommend 290 mcg dosing WITH Miralax if needed. For now, add Benefiber and Miralax daily. Handouts provided. Return in 3 months. Call if worsening or no improvement.

## 2018-11-18 DIAGNOSIS — R062 Wheezing: Secondary | ICD-10-CM | POA: Diagnosis not present

## 2018-11-18 DIAGNOSIS — J441 Chronic obstructive pulmonary disease with (acute) exacerbation: Secondary | ICD-10-CM | POA: Diagnosis not present

## 2018-11-18 DIAGNOSIS — J4520 Mild intermittent asthma, uncomplicated: Secondary | ICD-10-CM | POA: Diagnosis not present

## 2018-11-18 DIAGNOSIS — J449 Chronic obstructive pulmonary disease, unspecified: Secondary | ICD-10-CM | POA: Diagnosis not present

## 2018-11-23 ENCOUNTER — Other Ambulatory Visit: Payer: Self-pay

## 2018-11-23 NOTE — Patient Outreach (Signed)
[place] [facility]) Care Management  11/23/2018  JARREN PARA 06-Dec-1955  [id number]   Successful outreach with Mr. Pizzuto. He continues to progress with care management goals and no longer requires outreach from the [place] team. He reports continued compliance with medications and uses home oxygen as recommended. Reports occasional episodes of shortness of breath with exertion but otherwise feels that his COPD is well controlled. Mr. Clementson experienced episodes of abdominal pain and constipation over the last few weeks. He was evaluated in the Gastroenterology clinic on 11/14/18 and reports symptoms resolved after taking miralax and fiber supplements. He reports a good appetite and tolerating regular meals without difficulty. He denied changes in care management needs but will call for questions or concerns as needed.  PLAN -Will continue routine outreach.  [place] [phone]

## 2018-12-07 DIAGNOSIS — J4520 Mild intermittent asthma, uncomplicated: Secondary | ICD-10-CM | POA: Diagnosis not present

## 2018-12-07 DIAGNOSIS — R062 Wheezing: Secondary | ICD-10-CM | POA: Diagnosis not present

## 2018-12-07 DIAGNOSIS — J449 Chronic obstructive pulmonary disease, unspecified: Secondary | ICD-10-CM | POA: Diagnosis not present

## 2018-12-07 DIAGNOSIS — J441 Chronic obstructive pulmonary disease with (acute) exacerbation: Secondary | ICD-10-CM | POA: Diagnosis not present

## 2018-12-13 ENCOUNTER — Other Ambulatory Visit: Payer: Self-pay

## 2018-12-13 DIAGNOSIS — K219 Gastro-esophageal reflux disease without esophagitis: Secondary | ICD-10-CM | POA: Diagnosis not present

## 2018-12-13 DIAGNOSIS — I10 Essential (primary) hypertension: Secondary | ICD-10-CM | POA: Diagnosis not present

## 2018-12-13 DIAGNOSIS — J449 Chronic obstructive pulmonary disease, unspecified: Secondary | ICD-10-CM | POA: Diagnosis not present

## 2018-12-13 DIAGNOSIS — E1142 Type 2 diabetes mellitus with diabetic polyneuropathy: Secondary | ICD-10-CM | POA: Diagnosis not present

## 2018-12-13 NOTE — Patient Outreach (Signed)
[place] [facility]) Care Management  12/13/2018  SHANON BECVAR 08-06-1956 [id number]   Successful outreach with Mr. Nakama. Reports feeling well today.  Denies complaints of pain or shortness of breath. Reports ambulating well and denies changes in activity tolerance. Reports compliance with medications but will require refills within the next week. Per pharmacy, new orders are required. Contacted Dr. Josephine Cables office. Per staff, he needs an updated encounter prior to new orders being placed. Mr. Harold will complete telephonic outreach with Dr. Legrand Rams later today. PLAN Will follow up within a week.   [place] [phone]

## 2018-12-19 DIAGNOSIS — R062 Wheezing: Secondary | ICD-10-CM | POA: Diagnosis not present

## 2018-12-19 DIAGNOSIS — J449 Chronic obstructive pulmonary disease, unspecified: Secondary | ICD-10-CM | POA: Diagnosis not present

## 2018-12-19 DIAGNOSIS — J4520 Mild intermittent asthma, uncomplicated: Secondary | ICD-10-CM | POA: Diagnosis not present

## 2018-12-19 DIAGNOSIS — J441 Chronic obstructive pulmonary disease with (acute) exacerbation: Secondary | ICD-10-CM | POA: Diagnosis not present

## 2018-12-20 ENCOUNTER — Other Ambulatory Visit: Payer: Self-pay

## 2018-12-20 NOTE — Patient Outreach (Signed)
[place] [facility]) Care Management  12/20/2018  Bryan [name] 09-16-1955 [id number]    Follow-up regarding medications. [name] confirmed telephonic outreach with PCP. Reports medication orders were renewed but expressed concerns regarding cost of Trelegy and Albuterol. Agreeable to outreach from [place] Will update [facility] Pharmacist Will follow up with [name] within two weeks.    [place] [phone]

## 2018-12-24 ENCOUNTER — Other Ambulatory Visit: Payer: Self-pay

## 2018-12-24 DIAGNOSIS — J449 Chronic obstructive pulmonary disease, unspecified: Secondary | ICD-10-CM

## 2018-12-25 ENCOUNTER — Ambulatory Visit: Payer: Self-pay | Admitting: Pharmacist

## 2018-12-26 ENCOUNTER — Other Ambulatory Visit: Payer: Self-pay | Admitting: Pharmacist

## 2018-12-26 NOTE — Patient Outreach (Signed)
[place] [facility]) Care Management  [place]   12/26/2018  KHALEEF RUBY 13-Nov-1955 [id number]  Reason for referral: Medication Assistance  Referral source: [employer] RN Current insurance: [employer]  PMHx includes but not limited to:  COPD, constipation, HTN, TIA, GERD, T2DM, HLD  Outreach:  Successful telephone call with Mr. Donivin Wirt.  HIPAA identifiers verified.  Patient states he is doing well today.  He denies SOB, wheezing, etc.  Patient interested in medication assistance programs for Trelegy,Ventolin and Linzess.  Patient did not have time for full medication review--will revisit.  I was able to confirm COPD meds and Linzess along with corresponding providers.    Objective: Lab Results  Component Value Date   CREATININE 1.41 (H) 09/18/2018   CREATININE 1.48 (H) 09/17/2018   CREATININE 1.77 (H) 09/16/2018    Lab Results  Component Value Date   HGBA1C 6.6 (H) 09/18/2018    Lipid Panel     Component Value Date/Time   CHOL 164 07/23/2017 0403   TRIG 69 07/23/2017 0403   HDL 47 07/23/2017 0403   CHOLHDL 3.5 07/23/2017 0403   VLDL 14 07/23/2017 0403   LDLCALC 103 (H) 07/23/2017 0403    BP Readings from Last 3 Encounters:  11/14/18 127/84  09/18/18 135/80  08/15/18 (!) 135/91    No Known Allergies  Medications Reviewed Today    Reviewed by Inge Rise, CMA (Certified Medical Assistant) on 11/14/18 at [place] List Status: <None>  Medication Order Taking? Sig Documenting Provider Last Dose Status Informant  albuterol (PROVENTIL HFA;VENTOLIN HFA) 108 (90 Base) MCG/ACT inhaler [id number] Yes Inhale 1-2 puffs every 4 (four) hours as needed into the lungs for wheezing or shortness of breath. Do not use with nebulizer  Patient taking differently:  Inhale 1 puff into the lungs every 4 (four) hours as needed for wheezing or shortness of breath. Do not use with nebulizer   [name], MD Taking Active Self  albuterol (PROVENTIL)  (2.5 MG/3ML) 0.083% nebulizer solution [id number] Yes Take 3 mLs (2.5 mg total) by nebulization every 4 (four) hours as needed for wheezing or shortness of breath. [name], MD Taking Active Self  amLODipine (NORVASC) 5 MG tablet [id number] Yes Take 1 tablet (5 mg total) by mouth daily. [name], MD Taking Active Self  finasteride (PROSCAR) 5 MG tablet [id number] Yes Take 1 tablet (5 mg total) by mouth daily. Mendel Corning, MD Taking Active   Fluticasone-Umeclidin-Vilant (TRELEGY ELLIPTA) 100-62.5-25 MCG/INH AEPB [id number] Yes Inhale 1 puff into the lungs daily. [name], MD Taking Active Self  gabapentin (NEURONTIN) 300 MG capsule [id number] Yes Take 1 capsule (300 mg total) by mouth 2 (two) times daily. Pt. Says he is taking twice daily.  Patient taking differently:  Take 300 mg by mouth 2 (two) times daily.    Barton Dubois, MD Taking Active Self  guaiFENesin (MUCINEX) 600 MG 12 hr tablet [id number] Yes Take 1 tablet (600 mg total) by mouth 2 (two) times daily. [name], MD Taking Active Self  guaiFENesin-dextromethorphan [facility] DM) 100-10 MG/5ML syrup [id number] No Take 5 mLs by mouth every 4 (four) hours as needed for cough (chest congestion).  Patient not taking:  Reported on 11/14/2018   Mendel Corning, MD Not Taking Active   linaclotide Rolan Lipa) 72 MCG capsule [id number] Yes Take 1 capsule (72 mcg total) by mouth daily before breakfast.  Patient taking differently:  Take 72 mcg by mouth as needed.  [name], NP Taking Active Self  pantoprazole (PROTONIX) 40 MG tablet [id number] Yes Take 1 tablet (40 mg total) by mouth 2 (two) times daily before a meal. [name], NP Taking Active Self  predniSONE (DELTASONE) 10 MG tablet [id number] No Prednisone dosing: Take  Prednisone 98m (4 tabs) x 2 days, then taper to 313m(3 tabs) x 3 days, then 2014m2 tabs) x 3days, then 79m54m tab) x 3days, then OFF.  Patient not taking:  Reported on 11/14/2018   Rai,Mendel Corning Not Taking Active   tamsulosin (FLOEye [facility]4 MG CAPS capsule [id number] Take 1 capsule (0.4 mg total) by mouth daily after supper. Rai,Mendel Corning Taking Active          Medication Review Findings:  . Patient did not have additional time to review medications.  I will touch base with patient in 2 weeks to perform medication reconciliation.   Medication Assistance Findings:   Patient Assistance Programs: Trelegy & Ventolin HFA made by [employer] Russelluirement met: Yes o Out-of-pocket prescription expenditure met:   Yes - Patient has met application requirements to apply for this patient assistance program.    Linzess made by [employer]come requirement met: Yes o Out-of-pocket prescription expenditure met:   Yes - Patient has met application requirements to apply for this patient assistance program.     Plan: . I will route patient assistance letter to [facility]ician who will coordinate patient assistance program application process for medications listed above.  [facility] will assist with obtaining all required documents from both patient and provider(s) and submit application(s) once completed.   . I will f/u with patient within two weeks to complete medication reconciliation    JuliRegina EckarmD, [phone]

## 2018-12-28 ENCOUNTER — Other Ambulatory Visit: Payer: Self-pay | Admitting: Pharmacy Technician

## 2018-12-28 NOTE — Patient Outreach (Signed)
[place] [facility]) Care Management  12/28/2018  Bryan [name] 1956/05/01 [id number]                          Medication Assistance Referral  Referral From: [facility] RPh Jenne Pane  Medication/Company: Trelegy and [place] Cutter HFA / [place] Patient application portion:  Mailed Provider application portion: Faxed  to Dr. Luan Pulling  Medication/Company: Rolan Lipa / [employer] Patient application portion:  Mailed Provider application portion: Faxed  to A. [place], ANP   Follow up:  Will follow up with patient in 7-10 business days to confirm application(s) have been received.  Maud Deed Chana Bode [place] Certified Pharmacy Technician [place] Management Direct Dial:[phone]

## 2018-12-31 ENCOUNTER — Other Ambulatory Visit: Payer: Self-pay

## 2018-12-31 NOTE — Patient Outreach (Signed)
[place] [facility]) Care Management  12/31/2018  RAINER MOUNCE 1956-05-05 [id number]   Follow-up outreach with Mr. Highfill regarding medications. Pending receipt of medication assistance applications. Denies additional care management needs. Agreeable to follow-up later this month.  PLAN  Will follow-up and update care plan at the end of the month.   [place] [phone]

## 2019-01-01 ENCOUNTER — Telehealth: Payer: Self-pay

## 2019-01-01 NOTE — Telephone Encounter (Signed)
Received Patient Assistance papers for [place] from Baxter Kail, CPPhT [facility] Care Management. Application was completed and faxed back today.

## 2019-01-07 DIAGNOSIS — R062 Wheezing: Secondary | ICD-10-CM | POA: Diagnosis not present

## 2019-01-07 DIAGNOSIS — J4520 Mild intermittent asthma, uncomplicated: Secondary | ICD-10-CM | POA: Diagnosis not present

## 2019-01-07 DIAGNOSIS — J441 Chronic obstructive pulmonary disease with (acute) exacerbation: Secondary | ICD-10-CM | POA: Diagnosis not present

## 2019-01-07 DIAGNOSIS — J449 Chronic obstructive pulmonary disease, unspecified: Secondary | ICD-10-CM | POA: Diagnosis not present

## 2019-01-09 ENCOUNTER — Other Ambulatory Visit: Payer: Self-pay | Admitting: Pharmacy Technician

## 2019-01-09 NOTE — Patient Outreach (Signed)
[facility]) Care Management  01/09/2019  ASHAZ ROBLING 05/01/1956 [id number]    Successful call placed to patient regarding patient assistance application(s) for Linzess, Trelegy and Ventolin HFA , HIPAA identifiers verified. Informed Mr. Kaus that I received his applications that he mailed back in however, he did not include his proof of income and OOP spend report from pharmacy. He states that he would get those items together to send in. Will prepare a return envelope to mail out to patient to send documents back in.  Follow up:  Will submit applications to companies once all documents have been obtained.  Maud Deed Chana Bode [place] Certified Pharmacy Technician [place] Management Direct Dial:[phone]

## 2019-01-10 ENCOUNTER — Other Ambulatory Visit: Payer: Self-pay

## 2019-01-10 NOTE — Patient Outreach (Signed)
[facility]) Care Management  01/10/2019  Bryan [name] 04/23/1956 [id number]   Outreach with Bryan [name]. He has progressed well and successfully met his care management goals. He remains compliant with medications and treatment recommendations. He is actively engaged with [place] for medication assistance. Denies new or urgent care management needs.  PLAN Will update [facility] Pharmacist and complete case closure.   Bryan [name] [phone]

## 2019-01-18 ENCOUNTER — Ambulatory Visit: Payer: Self-pay

## 2019-01-18 DIAGNOSIS — R062 Wheezing: Secondary | ICD-10-CM | POA: Diagnosis not present

## 2019-01-18 DIAGNOSIS — J449 Chronic obstructive pulmonary disease, unspecified: Secondary | ICD-10-CM | POA: Diagnosis not present

## 2019-01-18 DIAGNOSIS — J4520 Mild intermittent asthma, uncomplicated: Secondary | ICD-10-CM | POA: Diagnosis not present

## 2019-01-18 DIAGNOSIS — J441 Chronic obstructive pulmonary disease with (acute) exacerbation: Secondary | ICD-10-CM | POA: Diagnosis not present

## 2019-01-22 DIAGNOSIS — J449 Chronic obstructive pulmonary disease, unspecified: Secondary | ICD-10-CM | POA: Diagnosis not present

## 2019-01-22 DIAGNOSIS — R109 Unspecified abdominal pain: Secondary | ICD-10-CM | POA: Diagnosis not present

## 2019-01-22 DIAGNOSIS — J9611 Chronic respiratory failure with hypoxia: Secondary | ICD-10-CM | POA: Diagnosis not present

## 2019-01-29 ENCOUNTER — Encounter (HOSPITAL_COMMUNITY): Payer: Self-pay | Admitting: Emergency Medicine

## 2019-01-29 ENCOUNTER — Other Ambulatory Visit: Payer: Self-pay

## 2019-01-29 ENCOUNTER — Emergency Department (HOSPITAL_COMMUNITY)
Admission: EM | Admit: 2019-01-29 | Discharge: 2019-01-29 | Disposition: A | Discharge status: Home / Self Care | Payer: Medicare Other | Attending: Emergency Medicine | Admitting: Emergency Medicine

## 2019-01-29 ENCOUNTER — Emergency Department (HOSPITAL_COMMUNITY): Payer: Medicare Other

## 2019-01-29 DIAGNOSIS — Z79899 Other long term (current) drug therapy: Secondary | ICD-10-CM | POA: Insufficient documentation

## 2019-01-29 DIAGNOSIS — N183 Chronic kidney disease, stage 3 (moderate): Secondary | ICD-10-CM | POA: Diagnosis not present

## 2019-01-29 DIAGNOSIS — I129 Hypertensive chronic kidney disease with stage 1 through stage 4 chronic kidney disease, or unspecified chronic kidney disease: Secondary | ICD-10-CM | POA: Diagnosis not present

## 2019-01-29 DIAGNOSIS — J449 Chronic obstructive pulmonary disease, unspecified: Secondary | ICD-10-CM | POA: Insufficient documentation

## 2019-01-29 DIAGNOSIS — J40 Bronchitis, not specified as acute or chronic: Secondary | ICD-10-CM

## 2019-01-29 DIAGNOSIS — Z87891 Personal history of nicotine dependence: Secondary | ICD-10-CM | POA: Diagnosis not present

## 2019-01-29 DIAGNOSIS — E1122 Type 2 diabetes mellitus with diabetic chronic kidney disease: Secondary | ICD-10-CM | POA: Diagnosis not present

## 2019-01-29 DIAGNOSIS — R200 Anesthesia of skin: Secondary | ICD-10-CM | POA: Diagnosis not present

## 2019-01-29 DIAGNOSIS — R0602 Shortness of breath: Secondary | ICD-10-CM | POA: Diagnosis not present

## 2019-01-29 LAB — URINALYSIS, ROUTINE W REFLEX MICROSCOPIC
Bacteria, UA: NONE SEEN
Bilirubin Urine: NEGATIVE
Glucose, UA: NEGATIVE mg/dL
Hgb urine dipstick: NEGATIVE
Ketones, ur: NEGATIVE mg/dL
Leukocytes,Ua: NEGATIVE
Nitrite: NEGATIVE
Protein, ur: 30 mg/dL — AB
Specific Gravity, Urine: 1.008 (ref 1.005–1.030)
pH: 6 (ref 5.0–8.0)

## 2019-01-29 LAB — CBC WITH DIFFERENTIAL/PLATELET
Abs Immature Granulocytes: 0.02 10*3/uL (ref 0.00–0.07)
Basophils Absolute: 0.1 10*3/uL (ref 0.0–0.1)
Basophils Relative: 1 %
Eosinophils Absolute: 0.3 10*3/uL (ref 0.0–0.5)
Eosinophils Relative: 4 %
HCT: 46.9 % (ref 39.0–52.0)
Hemoglobin: 14.1 g/dL (ref 13.0–17.0)
Immature Granulocytes: 0 %
Lymphocytes Relative: 26 %
Lymphs Abs: 1.8 10*3/uL (ref 0.7–4.0)
MCH: 25.5 pg — ABNORMAL LOW (ref 26.0–34.0)
MCHC: 30.1 g/dL (ref 30.0–36.0)
MCV: 84.8 fL (ref 80.0–100.0)
Monocytes Absolute: 0.8 10*3/uL (ref 0.1–1.0)
Monocytes Relative: 12 %
Neutro Abs: 4.1 10*3/uL (ref 1.7–7.7)
Neutrophils Relative %: 57 %
Platelets: 289 10*3/uL (ref 150–400)
RBC: 5.53 MIL/uL (ref 4.22–5.81)
RDW: 14.8 % (ref 11.5–15.5)
WBC: 7.1 10*3/uL (ref 4.0–10.5)
nRBC: 0 % (ref 0.0–0.2)

## 2019-01-29 LAB — COMPREHENSIVE METABOLIC PANEL
ALT: 15 U/L (ref 0–44)
AST: 21 U/L (ref 15–41)
Albumin: 4 g/dL (ref 3.5–5.0)
Alkaline Phosphatase: 118 U/L (ref 38–126)
Anion gap: 10 (ref 5–15)
BUN: 14 mg/dL (ref 8–23)
CO2: 28 mmol/L (ref 22–32)
Calcium: 9.3 mg/dL (ref 8.9–10.3)
Chloride: 101 mmol/L (ref 98–111)
Creatinine, Ser: 1.52 mg/dL — ABNORMAL HIGH (ref 0.61–1.24)
GFR calc Af Amer: 56 mL/min — ABNORMAL LOW (ref >60)
GFR calc non Af Amer: 48 mL/min — ABNORMAL LOW (ref >60)
Glucose, Bld: 101 mg/dL — ABNORMAL HIGH (ref 70–99)
Potassium: 4.5 mmol/L (ref 3.5–5.1)
Sodium: 139 mmol/L (ref 135–145)
Total Bilirubin: 0.3 mg/dL (ref 0.3–1.2)
Total Protein: 8.2 g/dL — ABNORMAL HIGH (ref 6.5–8.1)

## 2019-01-29 LAB — TROPONIN I: Troponin I: <0.03 ng/mL (ref <0.03)

## 2019-01-29 MED ORDER — DOXYCYCLINE HYCLATE 100 MG PO CAPS: 100.0000 mg | ORAL_CAPSULE | Freq: Two times a day (BID) | ORAL | 0 refills | Status: DC

## 2019-01-29 MED ORDER — DOXYCYCLINE HYCLATE 100 MG PO TABS
100.0000 mg | ORAL_TABLET | Freq: Once | ORAL | Status: AC
  Administered 2019-01-29: 20:00:00 100 mg via ORAL
  Filled 2019-01-29: qty 1

## 2019-01-29 MED ORDER — PREDNISONE 20 MG PO TABS: 20.0000 mg | ORAL_TABLET | Freq: Two times a day (BID) | ORAL | 0 refills | Status: DC

## 2019-01-29 MED ORDER — PREDNISONE 50 MG PO TABS
60.0000 mg | ORAL_TABLET | Freq: Once | ORAL | Status: AC
  Administered 2019-01-29: 60 mg via ORAL
  Filled 2019-01-29: qty 1

## 2019-01-29 NOTE — Patient Outreach (Signed)
[facility]) Care Management  01/29/2019  Bryan [name] 26-Apr-1956 [id number]    Received notification from [facility] Pharmacist regarding ED visit since discharge from community services.   Will re-engage [name] for [place] Management services.    PLAN Will follow up after discharge.   [place] [phone]

## 2019-01-29 NOTE — Discharge Instructions (Signed)
Your symptoms today are consistent with bronchitis.  We are treating you with prednisone and an antibiotic which was sent to your pharmacy.  It is important to get plenty of rest and drink a lot of fluids.  For pain use ibuprofen or acetaminophen.  Your urinalysis today was normal so there is no evidence for infection.  Please follow-up with your doctor for checkup in a week or so to make sure you are getting better.

## 2019-01-29 NOTE — ED Triage Notes (Addendum)
Pt c/o of sob with fever x 2 weeks.  Pt wears 2.5L [place] at home but states being out of it for 2 weeks. Also states having central chest pain

## 2019-01-29 NOTE — ED Provider Notes (Signed)
[facility] EMERGENCY DEPARTMENT Provider Note   CSN: [id number] Arrival date & time: 01/29/19  1520    History   Chief Complaint Chief Complaint  Patient presents with  . Shortness of Breath  . Numbness    HPI Bryan [name] is a 63 y.o. male.     HPI   Constellation of symptoms including general weakness, chills Numbness of the left hand and both feet.  He denies nausea, vomiting, focal weakness.  He came here by private vehicle for evaluation.  He is taking his usual medications.  He has not seen his physician about this problem.  There are no other known modifying factors.  Past Medical History:  Diagnosis Date  . Acid reflux   . Arthritis   . Asthma   . Chronic pain    with leg and back pain (disc problem)  . COPD (chronic obstructive pulmonary disease) ([place])   . Diabetes mellitus without complication ([place])   . Headache    HX OF  . History of upper GI x-ray series    to follow showed large duodenal ulcer H pylori serologies were negative  . Hypertension   . Pneumonia    07/17/17  . Pre-diabetes   . Stroke [facility])    TIA MINI STROKE  . Tubular adenoma     Patient Active Problem List   Diagnosis Date Noted  . COPD exacerbation ([place]) 09/16/2018  . Hypokalemia 06/26/2018  . Hypomagnesemia 06/26/2018  . Anemia 06/26/2018  . BPH (benign prostatic hyperplasia) 06/26/2018  . Chronic respiratory failure with hypoxia ([place]) 06/04/2018  . HCAP (healthcare-associated pneumonia) 06/04/2018  . Acute respiratory failure with hypoxia ([place]) 04/14/2018  . Pulmonary nodule 04/14/2018  . CKD (chronic kidney disease), stage III ([place]) 04/14/2018  . Type 2 diabetes mellitus with hyperlipidemia ([place]) 03/12/2018  . BPH with obstruction/lower urinary tract symptoms 08/17/2017  . Left sided numbness 07/22/2017  . TIA (transient ischemic attack) 07/22/2017  . Pneumonia 07/14/2017  . COPD with acute exacerbation ([place]) 07/14/2017  . Constipation 07/10/2017  . Hx of adenomatous  colonic polyps 05/01/2017  . Hypertension   . Chronic pain   . Heme positive stool 10/20/2014  . Epigastric pain 06/19/2014  . Dysphagia 06/19/2014  . AP (abdominal pain) 11/13/2013  . Early satiety 11/13/2013  . COPD, severe ([place]) 02/22/2013  . Low back pain 11/19/2012  . COLITIS 05/12/2009  . ABDOMINAL PAIN, LEFT LOWER QUADRANT 05/12/2009  . DUODENAL ULCER, HX OF 05/12/2009  . ALCOHOL USE 05/11/2009  . GERD 05/11/2009  . Diarrhea 05/11/2009  . SPONDYLOSIS, CERVICAL 07/03/2007  . NECK PAIN 07/03/2007    Past Surgical History:  Procedure Laterality Date  . BACK SURGERY  7/05; 5/09    Dr.Hirsch,3 lumbar  X3  . BACK SURGERY    . COLONOSCOPY  Feb 2012   Dr. Gala Romney: normal rectum, pedunculate polyp removed but not recovered  . COLONOSCOPY WITH ESOPHAGOGASTRODUODENOSCOPY (EGD) N/A 11/18/2013   Dr.Rourk- tcs= normal rectum, multipe polyps about the ileocecal valve and distal transverse colon o/w the remainder of the colonic mucosa appeared normal bx= tubular adenoma. EGD= normal esophagus, stomach with scattered erosions mottling, friablility, no ulcer or infiltrating process patent pylorus bx= chronic inflammation. next TCS 11/2016  . COLONOSCOPY WITH PROPOFOL N/A 05/25/2017   Dr. Gala Romney: sigmoid diverticulosis, one 4 mm hyerplastic rectal polyp, ascending colonic AVMs surveillance 2023  . ESOPHAGOGASTRODUODENOSCOPY  1/06   Dr. Volney American esophageal erosions,U-shaped stomach,marked erosions and edema of the bulb without discrete ulcer disease.   Marland Kitchen  ESOPHAGOGASTRODUODENOSCOPY (EGD) WITH PROPOFOL N/A 05/25/2017   Dr. Gala Romney: reflux esophagitis s/p empiric dilation, normal stomach and duodenum  . MALONEY DILATION N/A 05/25/2017   Procedure: Venia Minks DILATION;  Surgeon: Daneil Dolin, MD;  Location: [facility] ENDO SUITE;  Service: Endoscopy;  Laterality: N/A;  . NECK SURGERY  10/2007   PLATE IN NECK  . POLYPECTOMY  05/25/2017   Procedure: POLYPECTOMY;  Surgeon: Daneil Dolin, MD;  Location: [facility] ENDO  SUITE;  Service: Endoscopy;;  rectal  . TRANSURETHRAL RESECTION OF PROSTATE N/A 08/17/2017   Procedure: TRANSURETHRAL RESECTION OF THE PROSTATE (TURP);  Surgeon: Irine Seal, MD;  Location: [facility] ORS;  Service: Urology;  Laterality: N/A;        Home Medications    Prior to Admission medications   Medication Sig Start Date End Date Taking? Authorizing Provider  albuterol (PROVENTIL HFA;VENTOLIN HFA) 108 (90 Base) MCG/ACT inhaler Inhale 1-2 puffs every 4 (four) hours as needed into the lungs for wheezing or shortness of breath. Do not use with nebulizer Patient taking differently: Inhale 1 puff into the lungs every 4 (four) hours as needed for wheezing or shortness of breath. Do not use with nebulizer 07/17/17  Yes [name], MD  albuterol (PROVENTIL) (2.5 MG/3ML) 0.083% nebulizer solution Take 3 mLs (2.5 mg total) by nebulization every 4 (four) hours as needed for wheezing or shortness of breath. 06/29/18  Yes [name], MD  amLODipine (NORVASC) 5 MG tablet Take 1 tablet (5 mg total) by mouth daily. 03/13/18 03/13/19 Yes [name], MD  finasteride (PROSCAR) 5 MG tablet Take 1 tablet (5 mg total) by mouth daily. 09/18/18  Yes Rai, Ripudeep K, MD  Fluticasone-Umeclidin-Vilant (TRELEGY ELLIPTA) 100-62.5-25 MCG/INH AEPB Inhale 1 puff into the lungs daily. 06/29/18  Yes [name], MD  gabapentin (NEURONTIN) 300 MG capsule Take 1 capsule (300 mg total) by mouth 2 (two) times daily. Pt. Says he is taking twice daily. Patient taking differently: Take 300 mg by mouth 2 (two) times daily.  06/05/18  Yes Barton Dubois, MD  guaiFENesin (MUCINEX) 600 MG 12 hr tablet Take 1 tablet (600 mg total) by mouth 2 (two) times daily. 06/29/18 06/29/19 Yes [name], MD  linaclotide (LINZESS) 72 MCG capsule Take 1 capsule (72 mcg total) by mouth daily before breakfast. Patient taking differently: Take 72 mcg by mouth daily as needed (for constipation).  05/08/18  Yes [name], NP   lisinopril-hydrochlorothiazide (ZESTORETIC) 20-12.5 MG tablet Take 1 tablet by mouth daily.  12/17/18  Yes [provider]  omeprazole (PRILOSEC) 40 MG capsule Take 40 mg by mouth daily.  12/17/18  Yes [provider]  OXYGEN Inhale 2.5 L into the lungs daily.   Yes [provider]  polyethylene glycol powder (MIRALAX) 17 GM/SCOOP powder Take 17 g by mouth daily as needed (FOR CONSTIPATION).  11/14/18  Yes [provider]  tamsulosin (FLOMAX) 0.4 MG CAPS capsule Take 1 capsule (0.4 mg total) by mouth daily after supper. 09/18/18  Yes Rai, Ripudeep K, MD  doxycycline (VIBRAMYCIN) 100 MG capsule Take 1 capsule (100 mg total) by mouth 2 (two) times daily. 01/29/19   Daleen Bo, MD  predniSONE (DELTASONE) 20 MG tablet Take 1 tablet (20 mg total) by mouth 2 (two) times daily. 01/29/19   Daleen Bo, MD    Family History Family History  Problem Relation Age of Onset  . Cancer Father   . Asthma Mother   . Colon cancer Neg Hx     Social  History Social History   Tobacco Use  . Smoking status: Former Smoker    Packs/day: 0.50    Years: 40.00    Pack years: 20.00    Types: Cigarettes    Last attempt to quit: 11/11/2014    Years since quitting: 4.2  . Smokeless tobacco: Never Used  Substance Use Topics  . Alcohol use: No    Alcohol/week: 0.0 standard drinks  . Drug use: Not Currently    Types: Marijuana    Comment:  'every once in a while" ; denied 08/15/18     Allergies   Patient has no known allergies.   Review of Systems Review of Systems  All other systems reviewed and are negative.    Physical Exam Updated Vital Signs BP (!) 137/93   Pulse 85   Temp 99.3 F (37.4 C)   Resp (!) 30   Ht 5' 3" (1.6 m)   Wt 61.2 kg   SpO2 99%   BMI 23.91 kg/m   Physical Exam Vitals signs and nursing note reviewed.  Constitutional:      Appearance: He is well-developed and normal weight. He is not ill-appearing, toxic-appearing or diaphoretic.   HENT:     Head: Normocephalic and atraumatic.     Right Ear: External ear normal.     Left Ear: External ear normal.     Mouth/Throat:     Pharynx: No pharyngeal swelling or oropharyngeal exudate.  Eyes:     Conjunctiva/sclera: Conjunctivae normal.     Pupils: Pupils are equal, round, and reactive to light.  Neck:     Musculoskeletal: Normal range of motion and neck supple.     Trachea: Phonation normal.  Cardiovascular:     Rate and Rhythm: Normal rate and regular rhythm.     Heart sounds: Normal heart sounds.  Pulmonary:     Effort: Pulmonary effort is normal. No respiratory distress.     Breath sounds: No stridor.     Comments: Decreased breath sounds bilaterally with scattered rhonchi.  No audible wheezes or rales.  No increased work of breathing. Abdominal:     General: There is no distension.     Palpations: Abdomen is soft.     Tenderness: There is no abdominal tenderness.  Musculoskeletal: Normal range of motion.     Right lower leg: No edema.     Left lower leg: No edema.  Skin:    General: Skin is warm and dry.     Coloration: Skin is not jaundiced or pale.  Neurological:     Mental Status: He is alert and oriented to person, place, and time.     Cranial Nerves: No cranial nerve deficit.     Sensory: No sensory deficit.     Motor: No abnormal muscle tone.     Coordination: Coordination normal.  Psychiatric:        Mood and Affect: Mood normal.        Behavior: Behavior normal.        Thought Content: Thought content normal.        Judgment: Judgment normal.      ED Treatments / Results  Labs (all labs ordered are listed, but only abnormal results are displayed) Labs Reviewed  CBC WITH DIFFERENTIAL/PLATELET - Abnormal; Notable for the following components:      Result Value   MCH 25.5 (*)    All other components within normal limits  COMPREHENSIVE METABOLIC PANEL - Abnormal; Notable for the following components:  Glucose, Bld 101 (*)    Creatinine,  Ser 1.52 (*)    Total Protein 8.2 (*)    GFR calc non Af Amer 48 (*)    GFR calc Af Amer 56 (*)    All other components within normal limits  URINALYSIS, ROUTINE W REFLEX MICROSCOPIC - Abnormal; Notable for the following components:   Color, Urine STRAW (*)    Protein, ur 30 (*)    All other components within normal limits  TROPONIN I    EKG EKG Interpretation  Date/Time:  Tuesday Jan 29 2019 15:57:36 EDT Ventricular Rate:  99 PR Interval:    QRS Duration: 72 QT Interval:  337 QTC Calculation: 433 R Axis:   94 Text Interpretation:  Sinus rhythm Ventricular premature complex Right axis deviation since last tracing no significant change Confirmed by Daleen Bo [phone]) on 01/29/2019 4:02:41 PM   Radiology Dg Chest 2 View  Result Date: 01/29/2019 CLINICAL DATA:  Shortness of breath EXAM: CHEST - 2 VIEW COMPARISON:  Chest x-ray dated 01/05 2020. FINDINGS: There are prominent interstitial lung markings bilaterally. There is no pneumothorax. No large pleural effusion. There is no acute osseous abnormality. The lungs are hyperexpanded. IMPRESSION: 1. Prominent interstitial lung markings which can be seen in patients with volume overload or an atypical infectious process. 2. Hyperexpanded lungs consistent with COPD. Electronically Signed   By: Constance Holster M.D.   On: 01/29/2019 16:29    Procedures Procedures (including critical care time)  Medications Ordered in ED Medications  predniSONE (DELTASONE) tablet 60 mg (has no administration in time range)  doxycycline (VIBRA-TABS) tablet 100 mg (has no administration in time range)     Initial Impression / Assessment and Plan / ED Course  I have reviewed the triage vital signs and the nursing notes.  Pertinent labs & imaging results that were available during my care of the patient were reviewed by me and considered in my medical decision making (see chart for details).  Clinical Course as of Jan 29 1928  Tue Jan 29, 2019   1919 Normal  Urinalysis, Routine w reflex microscopic(!) [EW]  1919 Normal  Troponin I - ONCE - STAT [EW]  1919 Normal except glucose high, creatinine high, total protein high, GFR low  Comprehensive metabolic panel(!) [EW]  3142 Normal   [EW]  1920 Nonspecific increased interstitial markings.  No definite pneumonia or CHF, images reviewed by me  DG Chest 2 View [EW]    Clinical Course User Index [EW] Daleen Bo, MD        Patient Vitals for the past 24 hrs:  BP Temp Pulse Resp SpO2 Height Weight  01/29/19 1801 (!) 137/93 - 85 (!) 30 99 % - -  01/29/19 1700 (!) 144/87 - 88 (!) 29 99 % - -  01/29/19 1630 (!) 141/88 - 90 (!) 39 98 % - -  01/29/19 1600 (!) 147/84 - 95 (!) 25 98 % - -  01/29/19 1557 - - 96 (!) 23 97 % - -  01/29/19 1551 - - - - - _0  (1.6 m) 61.2 kg  01/29/19 1550 (!) 173/93 99.3 F (37.4 C) (!) 112 (!) 26 (!) 63 % - -    7:27 PM Reevaluation with update and discussion. After initial assessment and treatment, an updated evaluation reveals no further complaints, findings discussed with the patient and all questions were answered. Daleen Bo   Medical Decision Making: Patient presenting with malaise, and nonspecific symptoms.  He has COPD.  He does have cough, and is producing sputum.  X-ray is nonspecific for etiology and mild interstitial markings.  Prior cardiac echo done July 2019 did show normal ejection fraction, and moderate tricuspid valve regurgitation.  Evaluation most consistent with bronchitis, without evidence for pneumonia or acute congestive heart failure.  Doubt ACS or PE.    CRITICAL CARE-no Performed by: Daleen Bo  Nursing Notes Reviewed/ Care Coordinated Applicable Imaging Reviewed Interpretation of Laboratory Data incorporated into ED treatment  The patient appears reasonably screened and/or stabilized for discharge and I doubt any other medical condition or other [facility] requiring further screening, evaluation, or treatment in the  ED at this time prior to discharge.  Plan: Home Medications-continue usual medications; Home Treatments-rest, fluids; return here if the recommended treatment, does not improve the symptoms; Recommended follow up-PCP 1 week and as needed.   Final Clinical Impressions(s) / ED Diagnoses   Final diagnoses:  Bronchitis    ED Discharge Orders         Ordered    doxycycline (VIBRAMYCIN) 100 MG capsule  2 times daily     01/29/19 1929    predniSONE (DELTASONE) 20 MG tablet  2 times daily     01/29/19 1929           Daleen Bo, MD 01/29/19 1929

## 2019-01-30 DIAGNOSIS — K121 Other forms of stomatitis: Secondary | ICD-10-CM | POA: Diagnosis not present

## 2019-01-30 DIAGNOSIS — J449 Chronic obstructive pulmonary disease, unspecified: Secondary | ICD-10-CM | POA: Diagnosis not present

## 2019-01-31 ENCOUNTER — Ambulatory Visit: Payer: Self-pay | Admitting: Pharmacist

## 2019-01-31 ENCOUNTER — Other Ambulatory Visit: Payer: Self-pay | Admitting: Pharmacist

## 2019-02-01 ENCOUNTER — Ambulatory Visit: Payer: Medicare Other | Admitting: Urology

## 2019-02-05 ENCOUNTER — Other Ambulatory Visit: Payer: Self-pay

## 2019-02-05 NOTE — Patient Outreach (Signed)
[facility]) Care Management  02/05/2019  Bryan [name] March 04, 1956 [id number]   Successful outreach with Bryan [name]. He reports doing well since recent ED visit. Reports wearing a face mask and following recommendations related to COVID-19. He was running errands at the time of the call. Agreeable to completing telephonic assessment next week.  PLAN Will follow up on 02/12/19.  [place] [phone]

## 2019-02-06 DIAGNOSIS — J449 Chronic obstructive pulmonary disease, unspecified: Secondary | ICD-10-CM | POA: Diagnosis not present

## 2019-02-06 DIAGNOSIS — J4520 Mild intermittent asthma, uncomplicated: Secondary | ICD-10-CM | POA: Diagnosis not present

## 2019-02-06 DIAGNOSIS — J441 Chronic obstructive pulmonary disease with (acute) exacerbation: Secondary | ICD-10-CM | POA: Diagnosis not present

## 2019-02-06 DIAGNOSIS — R062 Wheezing: Secondary | ICD-10-CM | POA: Diagnosis not present

## 2019-02-06 NOTE — Patient Outreach (Addendum)
[facility]) Care Management [place]  02/06/2019  Bryan [name] 1955/09/29 [id number]  Reason for referral: medication assistance  Successful outreach call to Bryan [name] with HIPAA identifiers verified.  Patient states he has been doing okay.  He reports he has been out of Trelegy inhaler for ~1 month.  He is only using albuterol rescue inhaler.  He does have oxygen as needed at home.  Denies wheezing & SOB, however he states he can't breath as easily as when he is taking Trelegy.  Encouraged patient to reach out to PCP as needed.  Highly encouraged patient to send in complete application so we can continue to apply for Trelegy/albuterol for him through the manufacturer.  He forgot to mail in financial portion and [employer] out.  Patient counseled on importance of medication adherence.  He reports he finished his antibiotic and prednisone given from ED visit on 01/29/19.  PLAN: -I will follow up as needed for patient assistance. Information routed to [place], Etter Sjogren who is assisting with application process.  Regina Eck, PharmD, [place]  [phone]

## 2019-02-08 DIAGNOSIS — Z0001 Encounter for general adult medical examination with abnormal findings: Secondary | ICD-10-CM | POA: Diagnosis not present

## 2019-02-08 DIAGNOSIS — J449 Chronic obstructive pulmonary disease, unspecified: Secondary | ICD-10-CM | POA: Diagnosis not present

## 2019-02-08 DIAGNOSIS — Z1389 Encounter for screening for other disorder: Secondary | ICD-10-CM | POA: Diagnosis not present

## 2019-02-08 DIAGNOSIS — I10 Essential (primary) hypertension: Secondary | ICD-10-CM | POA: Diagnosis not present

## 2019-02-08 DIAGNOSIS — Z0000 Encounter for general adult medical examination without abnormal findings: Secondary | ICD-10-CM | POA: Diagnosis not present

## 2019-02-08 DIAGNOSIS — E1142 Type 2 diabetes mellitus with diabetic polyneuropathy: Secondary | ICD-10-CM | POA: Diagnosis not present

## 2019-02-12 ENCOUNTER — Other Ambulatory Visit: Payer: Self-pay

## 2019-02-12 NOTE — Patient Outreach (Signed)
[place] [facility]) Care Management  02/12/2019   LINDA BIEHN [birth year] [id number]    Subjective: Outreach for completion of telephonic assessment.  Objective:  Current Medications:  Current Outpatient Medications  Medication Sig Dispense Refill  . albuterol (PROVENTIL HFA;VENTOLIN HFA) 108 (90 Base) MCG/ACT inhaler Inhale 1-2 puffs every 4 (four) hours as needed into the lungs for wheezing or shortness of breath. Do not use with nebulizer (Patient taking differently: Inhale 1 puff into the lungs every 4 (four) hours as needed for wheezing or shortness of breath. Do not use with nebulizer) 1 Inhaler 0  . albuterol (PROVENTIL) (2.5 MG/3ML) 0.083% nebulizer solution Take 3 mLs (2.5 mg total) by nebulization every 4 (four) hours as needed for wheezing or shortness of breath. 75 mL 0  . amLODipine (NORVASC) 5 MG tablet Take 1 tablet (5 mg total) by mouth daily. 30 tablet 11  . doxycycline (VIBRAMYCIN) 100 MG capsule Take 1 capsule (100 mg total) by mouth 2 (two) times daily. 20 capsule 0  . finasteride (PROSCAR) 5 MG tablet Take 1 tablet (5 mg total) by mouth daily. 30 tablet 3  . Fluticasone-Umeclidin-Vilant (TRELEGY ELLIPTA) 100-62.5-25 MCG/INH AEPB Inhale 1 puff into the lungs daily. 28 each 0  . gabapentin (NEURONTIN) 300 MG capsule Take 1 capsule (300 mg total) by mouth 2 (two) times daily. Pt. Says he is taking twice daily. (Patient taking differently: Take 300 mg by mouth 2 (two) times daily. )    . guaiFENesin (MUCINEX) 600 MG 12 hr tablet Take 1 tablet (600 mg total) by mouth 2 (two) times daily. 40 tablet 0  . linaclotide (LINZESS) 72 MCG capsule Take 1 capsule (72 mcg total) by mouth daily before breakfast. (Patient taking differently: Take 72 mcg by mouth daily as needed (for constipation). ) 90 capsule 3  . lisinopril-hydrochlorothiazide (ZESTORETIC) 20-12.5 MG tablet Take 1 tablet by mouth daily.     Marland Kitchen omeprazole (PRILOSEC) 40 MG capsule Take 40 mg by mouth daily.     .  OXYGEN Inhale 2.5 L into the lungs daily.    . polyethylene glycol powder (MIRALAX) 17 GM/SCOOP powder Take 17 g by mouth daily as needed (FOR CONSTIPATION).     Marland Kitchen predniSONE (DELTASONE) 20 MG tablet Take 1 tablet (20 mg total) by mouth 2 (two) times daily. 10 tablet 0  . tamsulosin (FLOMAX) 0.4 MG CAPS capsule Take 1 capsule (0.4 mg total) by mouth daily after supper. 30 capsule 3   No current facility-administered medications for this visit.     Functional Status:  In your present state of health, do you have any difficulty performing the following activities: 02/12/2019 09/17/2018  Hearing? N N  Vision? N N  Difficulty concentrating or making decisions? N N  Walking or climbing stairs? Y N  Comment - -  Dressing or bathing? N N  Doing errands, shopping? (No Data) N  Comment Mostly independent. -  Preparing Food and eating ? Y -  Using the Toilet? N -  In the past six months, have you accidently leaked urine? N -  Do you have problems with loss of bowel control? N -  Managing your Medications? N -  Managing your Finances? N -  Housekeeping or managing your Housekeeping? Y -  Some recent data might be hidden    Fall/Depression Screening: Fall Risk  02/12/2019 11/23/2018 07/11/2018  Falls in the past year? 0 0 0  Comment - - Reported decreased activity tolerance but no falls.  Risk  for fall due to : Other (Comment) - -  Follow up Education provided;Falls prevention discussed - -   PHQ 2/9 Scores 02/12/2019 11/23/2018 07/11/2018 08/09/2017  PHQ - 2 Score 0 0 0 0    Assessment:  Successful outreach with Mr. Boateng. Telephonic assessment complete. He denies worsening shortness of breath since the Emergency Room visit on 01/29/19.  He continues to experience episodes of exertional dyspnea but overall reports feeling well.   He reports taking medications as prescribed. He is active with [place] and receiving medication assistance.   Discussed care management needs. He performs ADL's  independently. Denies need for additional assistance in the home. He expressed concerns regarding nutrition. He is interested in receiving delivered meals via the [facility] COVID-19 program. Denies need for transportation assistance. Reports his sister does not live in his home but is willing to assist if needed. Denies need for additional community referrals. THN CM Care Plan Problem One     Most Recent Value  Care Plan Problem One  Risk for admission due to chronic disease complications.  Role Documenting the Problem One  Care Management [place] for Problem One  Active  [facility] Long Term Goal   Patient will not be hospitalized for complications related to chronic illnesses over the next 90 days.  THN Long Term Goal Start Date  02/12/19  Interventions for Problem One Long Term Goal  Reviewed medications, nutrition, COPD action plan, COPD exacerbation "triggers"  and indications for seeking immediate medical attention. Discussed importance of monitoring blood glucose daily and recording readings.  THN CM Short Term Goal #1   Patient will complete and submitted required documents for medication assistance. [Medication assistance applications pending.]  THN CM Short Term Goal #1 Start Date  02/12/19  Interventions for Short Term Goal #1  Informed patient of options for financial assistance (per conversation with [facility] Pharmacist) . Provided correct contact numbers for [employer].  THN CM Short Term Goal #2   Over the next 30 days patient will complete all scheduled provider appointments.  THN CM Short Term Goal #2 Start Date  02/12/19  Interventions for Short Term Goal #2  Reviewed pending outreach/appointments. Encouraged patient to attend as scheduled to avoid delays in care. Discussed options if transportation assistance is needed.  [Denies current transportation needs.]  THN CM Short Term Goal #3  Patient will follow action plan for COPD self-management over the next 30 days.  THN CM  Short Term Goal #3 Start Date  02/12/19  Interventions for Short Tern Goal #3  Discussed medications and current plan for COPD self-managment. Encouraged to avoid COPD "triggers" along with activites that result in overexertion.   THN CM Short Term Goal #4  Over the next 30 days patient will monitor his blood glucose daily and record readings.  THN CM Short Term Goal #4 Start Date  02/12/19  Interventions for Short Term Goal #4  Provided education regarding importance of daily glucose monitoring. Discussed  parameters and s/sx that require immediate medical attention. [Pending refills for test strips and lancets.]      PLAN -Will submit referral to [place] follow-up with Mr. Turnage next week.   [place] [phone]

## 2019-02-15 ENCOUNTER — Encounter: Payer: Self-pay | Admitting: Internal Medicine

## 2019-02-15 ENCOUNTER — Encounter: Payer: Self-pay | Admitting: Gastroenterology

## 2019-02-15 ENCOUNTER — Ambulatory Visit (INDEPENDENT_AMBULATORY_CARE_PROVIDER_SITE_OTHER): Payer: Medicare Other | Admitting: Urology

## 2019-02-15 ENCOUNTER — Telehealth: Payer: Self-pay

## 2019-02-15 ENCOUNTER — Other Ambulatory Visit: Payer: Self-pay | Admitting: *Deleted

## 2019-02-15 ENCOUNTER — Ambulatory Visit: Payer: Medicare Other | Admitting: Gastroenterology

## 2019-02-15 ENCOUNTER — Other Ambulatory Visit: Payer: Self-pay

## 2019-02-15 VITALS — BP 129/91 | HR 100 | Temp 98.2°F | Ht 63.0 in | Wt 127.0 lb

## 2019-02-15 DIAGNOSIS — K2100 Gastro-esophageal reflux disease with esophagitis, without bleeding: Secondary | ICD-10-CM

## 2019-02-15 DIAGNOSIS — D649 Anemia, unspecified: Secondary | ICD-10-CM | POA: Diagnosis not present

## 2019-02-15 DIAGNOSIS — R350 Frequency of micturition: Secondary | ICD-10-CM

## 2019-02-15 DIAGNOSIS — R1084 Generalized abdominal pain: Secondary | ICD-10-CM

## 2019-02-15 DIAGNOSIS — R351 Nocturia: Secondary | ICD-10-CM

## 2019-02-15 MED ORDER — PANTOPRAZOLE SODIUM 40 MG PO TBEC: 40.0000 mg | DELAYED_RELEASE_TABLET | Freq: Every day | ORAL | 3 refills | Status: DC

## 2019-02-15 NOTE — Patient Instructions (Signed)
Please stop omeprazole (Prilosec). I have sent in pantoprazole (Protonix) to take each morning, 30 minutes before breakfast.  Continue Linzess once each day. I have provided more samples. We will try to see if this can be covered for you. Take Miralax 1 capful daily if needed if you have not had a good bowel movement that day.   Stop taking Aleve. Only use tylenol products.  Please have blood work done today.  I am referring you to a surgeon to discuss if you have any hernia that is causing this pain.  I will see you in 8 weeks!  I enjoyed seeing you again today! As you know, I value our relationship and want to provide genuine, compassionate, and quality care. I welcome your feedback. If you receive a survey regarding your visit,  I greatly appreciate you taking time to fill this out. See you next time!  [name], PhD, ANP-BC [facility]

## 2019-02-15 NOTE — Progress Notes (Signed)
Primary Care Physician:  Rosita Fire, MD Primary GI: Dr. Gala Romney   Chief Complaint  Patient presents with  . Abdominal Pain    mid abd, everyday    HPI:   Bryan [name] is a 63 y.o. male presenting today with a history of chronic abdominal pain, constipation, GERD, esophagitis, dysphagia. He reported dysphagia despite empiric dilation in Sept 2018. Known esophagitis. BPE completed without motility issues. No stricture. Due to abdominal pain, HIDA completed and normal with EF 92%. CT without contrast May 2019 with wall thickening throughout sigmoid without focal inflammatory changes, possibly due to underdistension, unable to exclude low grade colitis. (last colonoscopy Sept 2018)I discussed with radiologist and low likelihood of infectious process. Pancreas unremarkable on non-contrast exam. He was hospitalized in July due to abdominal/chest pain. Negative cardiac markers. CTA chest 4 mm nodule in left lower lobe Aug 2019. Recommend non-contrast chest CT in 12 months. History of smoking 1-1.5 ppd X 45 years, no smoking in 4 years. No family history of lung cancer.However, he recently had CTA chest Jan 2020 due to SOB and chest pain, without evidence of lung nodule  115 July 2019, 120 Aug 2019, 127 in Dec 2019.  March 2020 134. Today 127.   Constipation: likely contributing to abdominal pain in past. BM daily. Pain not improved after BM. Sometimes BM not productive. Taking Linzess samples, lowest dosage from what I can tell. Sometimes doesn't help.   Abdominal pain: at last visit, was precipitated by moving. Now abdominal pain worsening. Now every day. Wakes up at night with pain. Pain diffusely across abdomen. Feels like a cramping in abdomen and can't move. With urinating, pain is worsened but actually "all over". Working around the house and has to slow down due to precipitating abdominal pain. Nothing relieves. Always there. Noted as 10/10. Not worsened with eating. No N/V. No hematochezia.  Sometimes stool is black and sometimes "light beige". LFTs normal.   GERD: Omeprazole 40 mg daily. No dysphagia. States when he started taking omeprazole, his pain started. Aleve intermittently.   Past Medical History:  Diagnosis Date  . Acid reflux   . Arthritis   . Asthma   . Chronic pain    with leg and back pain (disc problem)  . COPD (chronic obstructive pulmonary disease) ([place])   . Diabetes mellitus without complication ([place])   . Headache    HX OF  . History of upper GI x-ray series    to follow showed large duodenal ulcer H pylori serologies were negative  . Hypertension   . Pneumonia    07/17/17  . Pre-diabetes   . Stroke [facility])    TIA MINI STROKE  . Tubular adenoma     Past Surgical History:  Procedure Laterality Date  . BACK SURGERY  7/05; 5/09    Dr.Hirsch,3 lumbar  X3  . BACK SURGERY    . COLONOSCOPY  Feb 2012   Dr. Gala Romney: normal rectum, pedunculate polyp removed but not recovered  . COLONOSCOPY WITH ESOPHAGOGASTRODUODENOSCOPY (EGD) N/A 11/18/2013   Dr.Rourk- tcs= normal rectum, multipe polyps about the ileocecal valve and distal transverse colon o/w the remainder of the colonic mucosa appeared normal bx= tubular adenoma. EGD= normal esophagus, stomach with scattered erosions mottling, friablility, no ulcer or infiltrating process patent pylorus bx= chronic inflammation. next TCS 11/2016  . COLONOSCOPY WITH PROPOFOL N/A 05/25/2017   Dr. Gala Romney: sigmoid diverticulosis, one 4 mm hyerplastic rectal polyp, ascending colonic AVMs surveillance 2023  . ESOPHAGOGASTRODUODENOSCOPY  1/06  Dr. Volney American esophageal erosions,U-shaped stomach,marked erosions and edema of the bulb without discrete ulcer disease.   . ESOPHAGOGASTRODUODENOSCOPY (EGD) WITH PROPOFOL N/A 05/25/2017   Dr. Gala Romney: reflux esophagitis s/p empiric dilation, normal stomach and duodenum  . MALONEY DILATION N/A 05/25/2017   Procedure: Venia Minks DILATION;  Surgeon: Daneil Dolin, MD;  Location: [facility] END[facility] SUITE;   Service: Endoscopy;  Laterality: N/A;  . NECK SURGERY  10/2007   PLATE IN NECK  . POLYPECTOMY  05/25/2017   Procedure: POLYPECTOMY;  Surgeon: Daneil Dolin, MD;  Location: [facility] END[facility] SUITE;  Service: Endoscopy;;  rectal  . TRANSURETHRAL RESECTION OF PROSTATE N/A 08/17/2017   Procedure: TRANSURETHRAL RESECTION OF THE PROSTATE (TURP);  Surgeon: Irine Seal, MD;  Location: [facility] ORS;  Service: Urology;  Laterality: N/A;    Current Outpatient Medications  Medication Sig Dispense Refill  . albuterol (PROVENTIL HFA;VENTOLIN HFA) 108 (90 Base) MCG/ACT inhaler Inhale 1-2 puffs every 4 (four) hours as needed into the lungs for wheezing or shortness of breath. Do not use with nebulizer (Patient taking differently: Inhale 1 puff into the lungs every 4 (four) hours as needed for wheezing or shortness of breath. Do not use with nebulizer) 1 Inhaler 0  . albuterol (PROVENTIL) (2.5 MG/3ML) 0.083% nebulizer solution Take 3 mLs (2.5 mg total) by nebulization every 4 (four) hours as needed for wheezing or shortness of breath. 75 mL 0  . amLODipine (NORVASC) 5 MG tablet Take 1 tablet (5 mg total) by mouth daily. 30 tablet 11  . gabapentin (NEURONTIN) 300 MG capsule Take 1 capsule (300 mg total) by mouth 2 (two) times daily. Pt. Says he is taking twice daily. (Patient taking differently: Take 300 mg by mouth 2 (two) times daily. )    . guaiFENesin (MUCINEX) 600 MG 12 hr tablet Take 1 tablet (600 mg total) by mouth 2 (two) times daily. 40 tablet 0  . linaclotide (LINZESS) 72 MCG capsule Take 1 capsule (72 mcg total) by mouth daily before breakfast. (Patient taking differently: Take 72 mcg by mouth daily as needed (for constipation). ) 90 capsule 3  . omeprazole (PRILOSEC) 40 MG capsule Take 40 mg by mouth daily.     . OXYGEN Inhale 2.5 L into the lungs daily.    . polyethylene glycol powder (MIRALAX) 17 GM/SCOOP powder Take 17 g by mouth daily as needed (FOR CONSTIPATION).     Marland Kitchen tamsulosin (FLOMAX) 0.4 MG CAPS capsule Take  1 capsule (0.4 mg total) by mouth daily after supper. 30 capsule 3  . pantoprazole (PROTONIX) 40 MG tablet Take 1 tablet (40 mg total) by mouth daily before breakfast. 90 tablet 3   No current facility-administered medications for this visit.     Allergies as of 02/15/2019  . (No Known Allergies)    Family History  Problem Relation Age of Onset  . Cancer Father   . Asthma Mother   . Colon cancer Neg Hx     Social History   Socioeconomic History  . Marital status: Single    Spouse name: Not on file  . Number of children: 2  . Years of education: Not on file  . Highest education level: Not on file  Occupational History  . Occupation: disabled    Fish farm manager: UNEMPLOYED  Social Needs  . Financial resource strain: Not on file  . Food insecurity:    Worry: Not on file    Inability: Not on file  . Transportation needs:    Medical: Not on file  Non-medical: Not on file  Tobacco Use  . Smoking status: Former Smoker    Packs/day: 0.50    Years: 40.00    Pack years: 20.00    Types: Cigarettes    Last attempt to quit: 11/11/2014    Years since quitting: 4.2  . Smokeless tobacco: Never Used  Substance and Sexual Activity  . Alcohol use: No    Alcohol/week: 0.0 standard drinks  . Drug use: Yes    Types: Marijuana    Comment: 02/15/19-"hit one 3 days ago"  . Sexual activity: Not Currently    Birth control/protection: None  Lifestyle  . Physical activity:    Days per week: Not on file    Minutes per session: Not on file  . Stress: Not on file  Relationships  . Social connections:    Talks on phone: Not on file    Gets together: Not on file    Attends religious service: Not on file    Active member of club or organization: Not on file    Attends meetings of clubs or organizations: Not on file    Relationship status: Not on file  Other Topics Concern  . Not on file  Social History Narrative   Lives with girlfriend, is on disability for history of back injuries.     Review of Systems: As noted in HPI.   Physical Exam: BP (!) 129/91   Pulse 100   Temp 98.2 F (36.8 C) (Oral)   Ht _0  (1.6 m)   Wt 127 lb (57.6 kg)   BMI 22.50 kg/m  General:   Alert and oriented. No distress noted. Pleasant and cooperative.  Head:  Normocephalic and atraumatic. Eyes:  Conjuctiva clear without scleral icterus. Mouth:  Oral mucosa pink and moist. Good dentition. No lesions. Abdomen:  +BS, soft, TTP epigastric. Non-distended. Diastasis recti vs ventral hernia, with TTP significantly and noted by patient to be reproduction of pain he feels Msk:  Symmetrical without gross deformities. Normal posture. Extremities:  Without edema. Neurologic:  Alert and  oriented x4 Psych:  Alert and cooperative. Normal mood and affect.  Lab Results  Component Value Date   WBC 7.1 01/29/2019   HGB 14.1 01/29/2019   HCT 46.9 01/29/2019   MCV 84.8 01/29/2019   PLT 289 01/29/2019

## 2019-02-15 NOTE — Telephone Encounter (Signed)
Spoke with [employer] pt assistance, they left a VM for pt to return their call and haven't heard back from them. Called pt and he will call back when he is able to write the phone number done. AB is aware of this in reference to pts pt assistance for Linzess. Pt also had an application submitted through [facility].

## 2019-02-16 LAB — IRON,TIBC AND FERRITIN PANEL
%SAT: 14 % (calc) — ABNORMAL LOW (ref 20–48)
Ferritin: 29 ng/mL (ref 24–380)
Iron: 48 ug/dL — ABNORMAL LOW (ref 50–180)
TIBC: 345 mcg/dL (calc) (ref 250–425)

## 2019-02-17 NOTE — Assessment & Plan Note (Signed)
Worsened with movement, now daily. Pain not improved s/p BM. On exam, diastasis recti vs ventral hernia and reproduction of discomfort with palpation. Will maximize bowel regimen and refer to surgery for further evaluation.

## 2019-02-17 NOTE — Assessment & Plan Note (Signed)
Currently taking omeprazole daily. Interestingly, he notes upper abdominal pain onset correlating around starting this. Will change back to Protonix for now. See abdominal pain.

## 2019-02-17 NOTE — Assessment & Plan Note (Signed)
Hgb now normalized but ferritin low several months ago. Recheck iron studies.

## 2019-02-18 DIAGNOSIS — J449 Chronic obstructive pulmonary disease, unspecified: Secondary | ICD-10-CM | POA: Diagnosis not present

## 2019-02-18 DIAGNOSIS — J441 Chronic obstructive pulmonary disease with (acute) exacerbation: Secondary | ICD-10-CM | POA: Diagnosis not present

## 2019-02-18 DIAGNOSIS — R062 Wheezing: Secondary | ICD-10-CM | POA: Diagnosis not present

## 2019-02-18 DIAGNOSIS — J4520 Mild intermittent asthma, uncomplicated: Secondary | ICD-10-CM | POA: Diagnosis not present

## 2019-02-18 NOTE — Progress Notes (Signed)
CC'D TO PCP °

## 2019-02-20 ENCOUNTER — Other Ambulatory Visit: Payer: Self-pay

## 2019-02-20 NOTE — Patient Outreach (Signed)
[place] [facility]) Care Management  02/20/2019  Bryan [name] [birth year] [id number]   Request received from [facility], Neldon Labella, to refer patient to [facility] to receive delivered meals.  Patient will receive first meal delivery on 02/22/19.  Ronn Melena, BSW Social Worker [phone]

## 2019-02-21 ENCOUNTER — Other Ambulatory Visit: Payer: Self-pay

## 2019-02-21 ENCOUNTER — Other Ambulatory Visit: Payer: Self-pay | Admitting: Pharmacy Technician

## 2019-02-21 NOTE — Patient Outreach (Signed)
[place] [facility]) Care Management  02/21/2019  [name] 1955/11/26 [id number]   Follow-up outreach with Bryan [name]. He reports a nonproductive cough but denies shortness of breath. Reports feeling well today.  Discussed treatment recommendations. He remains compliant with medications. He checks his blood pressure routinely but does not consistently monitor his blood glucose levels. Encouraged to monitor FBS levels daily and record readings. As of 09/2018 his A1C was 6.6%. Morning BP reading -137/86. [name] denies immediate care management needs. Pending meal delivery on tomorrow.   PLAN -Will follow-up within two weeks.   [place] [phone]

## 2019-02-21 NOTE — Patient Outreach (Signed)
[place] [facility]) Care Management  02/21/2019  ORVIL FARAONE [birth year] [id number]   Received patient portion(s) of patient assistance application for Trelegy, Ventolin and Linzess.   Patient sent in proof of income as well as last page of printout from [place]. Per printout patient has not spent required amount to apply to [place] for Trelegy and Ventolin HFA. Co-pay of Linzess not on printout, Submitted to [employer]..  Will follow up with [employer] in 14-21 business days to check status of application.  Will route note to [place] to inform of spending requirement not being met.  Maud Deed Chana Bode [place] Certified Pharmacy Technician [place] Management Direct Dial:[phone]

## 2019-03-07 ENCOUNTER — Other Ambulatory Visit: Payer: Self-pay

## 2019-03-07 NOTE — Patient Outreach (Signed)
[place] [facility]) Care Management  03/07/2019  ALTAN KRAAI 15-Aug-1956 [id number]   Follow-up regarding FBS. Mr. Tuckey reports not monitoring his blood sugar since Sunday. Unable to recall exact reading but reports it was around 1m/dl. He is pending refills of medications and glucometer supplies. Agreed to contact RNCM if assistance is needed.  PLAN -Will follow-up in two weeks.  [place]([phone]

## 2019-03-09 DIAGNOSIS — R062 Wheezing: Secondary | ICD-10-CM | POA: Diagnosis not present

## 2019-03-09 DIAGNOSIS — J441 Chronic obstructive pulmonary disease with (acute) exacerbation: Secondary | ICD-10-CM | POA: Diagnosis not present

## 2019-03-09 DIAGNOSIS — J4520 Mild intermittent asthma, uncomplicated: Secondary | ICD-10-CM | POA: Diagnosis not present

## 2019-03-09 DIAGNOSIS — J449 Chronic obstructive pulmonary disease, unspecified: Secondary | ICD-10-CM | POA: Diagnosis not present

## 2019-03-11 DIAGNOSIS — I10 Essential (primary) hypertension: Secondary | ICD-10-CM | POA: Diagnosis not present

## 2019-03-11 DIAGNOSIS — E1142 Type 2 diabetes mellitus with diabetic polyneuropathy: Secondary | ICD-10-CM | POA: Diagnosis not present

## 2019-03-20 DIAGNOSIS — J441 Chronic obstructive pulmonary disease with (acute) exacerbation: Secondary | ICD-10-CM | POA: Diagnosis not present

## 2019-03-20 DIAGNOSIS — J449 Chronic obstructive pulmonary disease, unspecified: Secondary | ICD-10-CM | POA: Diagnosis not present

## 2019-03-20 DIAGNOSIS — J4520 Mild intermittent asthma, uncomplicated: Secondary | ICD-10-CM | POA: Diagnosis not present

## 2019-03-20 DIAGNOSIS — R062 Wheezing: Secondary | ICD-10-CM | POA: Diagnosis not present

## 2019-03-21 ENCOUNTER — Other Ambulatory Visit: Payer: Self-pay

## 2019-03-21 NOTE — Patient Outreach (Signed)
[place] [facility]) Care Management  03/21/2019  Bryan [name] 12-10-1955 [id number]   Follow-up regarding blood sugar readings. Bryan [name] reports feeling well today but has not monitored his FBS this week. He denies worsening symptoms since our last conversation.  He reports taking medications as prescribed. He was strongly encouraged to monitor his FBS daily and record readings. He is agreeable to follow-up outreach next week.   PLAN -Will follow-up next week.  [name] [phone]

## 2019-03-28 ENCOUNTER — Other Ambulatory Visit: Payer: Self-pay | Admitting: Pharmacy Technician

## 2019-03-28 NOTE — Patient Outreach (Signed)
[place] [facility]) Care Management  03/28/2019  DUWAYNE MATTERS 08-26-1956 [id number]    Follow up call placed to [place] regarding patient assistance application(s) for Linzess , patient denied due to medication being covered under insurance/co-pay amount.  Follow up:  Will route note to [place] for case closure  Maud Deed. Chana Bode [place] Certified Pharmacy Technician [place] Management Direct Dial:[phone]

## 2019-03-29 ENCOUNTER — Other Ambulatory Visit: Payer: Self-pay

## 2019-03-29 NOTE — Patient Outreach (Signed)
[place] [facility]) Care Management  03/29/2019  Bryan [name] [birth year] [id number]   Follow-up outreach to discuss fasting blood sugar levels. Bryan [name] reports his FBS has ranged of 45m/dl -1282mdl over the past week. He continues to take medications as prescribed and reports improvement with maintaining a diabetic diet.  Morning readings: FBS-8210ml   BP-115/76  He denies urgent concerns and reports feeling "very good" today. He will complete a Pulmonology follow-up with Dr. HawLuan Pulling 04/05/19.   PLAN  -Will continue routine outreach.   [phone]

## 2019-04-08 DIAGNOSIS — R062 Wheezing: Secondary | ICD-10-CM | POA: Diagnosis not present

## 2019-04-08 DIAGNOSIS — E1142 Type 2 diabetes mellitus with diabetic polyneuropathy: Secondary | ICD-10-CM | POA: Diagnosis not present

## 2019-04-08 DIAGNOSIS — J449 Chronic obstructive pulmonary disease, unspecified: Secondary | ICD-10-CM | POA: Diagnosis not present

## 2019-04-08 DIAGNOSIS — J441 Chronic obstructive pulmonary disease with (acute) exacerbation: Secondary | ICD-10-CM | POA: Diagnosis not present

## 2019-04-08 DIAGNOSIS — J4520 Mild intermittent asthma, uncomplicated: Secondary | ICD-10-CM | POA: Diagnosis not present

## 2019-04-09 ENCOUNTER — Ambulatory Visit: Payer: Self-pay | Admitting: Pharmacist

## 2019-04-09 ENCOUNTER — Other Ambulatory Visit: Payer: Self-pay | Admitting: Pharmacist

## 2019-04-09 NOTE — Patient Outreach (Signed)
[place] [facility]) Care Management  [place]  04/09/2019  Bryan [name] [birth year] [id number]    Successful outreach to [name] with HIPAA identifiers verified x2 .  Patient did not qualify for patient assistance programs as he has not met the out of pocket expense required by [employer] ($600).  Patient does not have any more Trelegy available to him.  Requested he call his PCP for additional samples as able.  He continues to use his rescue alubterol daily.  Upon review, patient meets the requirements for LIS/extra help.  Verbal permission given by patient for assistance in filling out LIS application telephonically.  Patient verbalizes understanding.  Application was submitted _0  on 04/09/19.  [employer]-entry #[id number].  Medicare will process the application as quickly as possible.  Notified patient that Medicare will contact him if we need more information.  A letter will be sent to advise patient if he qualifies for extra help.  PLAN: -I will follow up with patient next month regarding LIS/extra help application   Regina Eck, PharmD, [place]  [phone]

## 2019-04-15 ENCOUNTER — Other Ambulatory Visit: Payer: Self-pay

## 2019-04-15 NOTE — Patient Outreach (Signed)
[place] [facility]) Care Management  04/15/2019  Bryan [name] Sep 03, 1956 [id number]   Brief outreach with [name]. Reports running errands at the time of the call. Agreeable to returning the call when he is available.  PLAN -Pending return call.   [place] [phone]

## 2019-04-20 DIAGNOSIS — J449 Chronic obstructive pulmonary disease, unspecified: Secondary | ICD-10-CM | POA: Diagnosis not present

## 2019-04-20 DIAGNOSIS — J441 Chronic obstructive pulmonary disease with (acute) exacerbation: Secondary | ICD-10-CM | POA: Diagnosis not present

## 2019-04-20 DIAGNOSIS — J4520 Mild intermittent asthma, uncomplicated: Secondary | ICD-10-CM | POA: Diagnosis not present

## 2019-04-20 DIAGNOSIS — R062 Wheezing: Secondary | ICD-10-CM | POA: Diagnosis not present

## 2019-04-22 DIAGNOSIS — G5600 Carpal tunnel syndrome, unspecified upper limb: Secondary | ICD-10-CM | POA: Diagnosis not present

## 2019-04-22 DIAGNOSIS — G5620 Lesion of ulnar nerve, unspecified upper limb: Secondary | ICD-10-CM | POA: Diagnosis not present

## 2019-04-22 DIAGNOSIS — G5602 Carpal tunnel syndrome, left upper limb: Secondary | ICD-10-CM | POA: Diagnosis not present

## 2019-04-22 DIAGNOSIS — M79606 Pain in leg, unspecified: Secondary | ICD-10-CM | POA: Diagnosis not present

## 2019-04-22 DIAGNOSIS — E0840 Diabetes mellitus due to underlying condition with diabetic neuropathy, unspecified: Secondary | ICD-10-CM | POA: Diagnosis not present

## 2019-04-22 DIAGNOSIS — G5601 Carpal tunnel syndrome, right upper limb: Secondary | ICD-10-CM | POA: Diagnosis not present

## 2019-04-24 DIAGNOSIS — M818 Other osteoporosis without current pathological fracture: Secondary | ICD-10-CM | POA: Diagnosis not present

## 2019-04-24 DIAGNOSIS — E559 Vitamin D deficiency, unspecified: Secondary | ICD-10-CM | POA: Diagnosis not present

## 2019-04-24 DIAGNOSIS — Z79899 Other long term (current) drug therapy: Secondary | ICD-10-CM | POA: Diagnosis not present

## 2019-04-24 DIAGNOSIS — E039 Hypothyroidism, unspecified: Secondary | ICD-10-CM | POA: Diagnosis not present

## 2019-04-24 DIAGNOSIS — R5383 Other fatigue: Secondary | ICD-10-CM | POA: Diagnosis not present

## 2019-04-25 DIAGNOSIS — G5602 Carpal tunnel syndrome, left upper limb: Secondary | ICD-10-CM | POA: Diagnosis not present

## 2019-04-25 DIAGNOSIS — G5601 Carpal tunnel syndrome, right upper limb: Secondary | ICD-10-CM | POA: Diagnosis not present

## 2019-04-26 ENCOUNTER — Ambulatory Visit: Payer: Medicare Other | Admitting: Urology

## 2019-04-30 ENCOUNTER — Other Ambulatory Visit: Payer: Self-pay

## 2019-04-30 NOTE — Patient Outreach (Signed)
[place] [facility]) Care Management  04/30/2019  RYLAN BERNARD 14-Aug-1956 [id number]    Successful outreach with Mr. Grupe. Reports feeling well today but reports notable weakness to his lower extremities over the past week. Reports experiencing several episodes of thigh pain and weakness making it difficult to ambulate. The episodes are not triggered by specific activities. Denies falls. This is a concern as he was previously ambulating without difficulty.   He remains compliant with medications. He continues to experience exertional dyspnea but overall feels that his COPD is well managed with the current regimen. He admits to not monitoring his FBS today but reports a range of 90-170m/dl.   Discussed care management needs since his ability to ambulate has declined. He reports that his sister lives across the street and is available to assist. Denies current need for assistance in the home.   Contacted Dr. FJosephine Cablesoffice.  Clinic visit scheduled for 05/02/19. Mr. LFaraoneis aware and agreeable to this plan. Discussed safety and fall prevention. Also discussed indications for seeking medical attention if his symptoms worsen prior to the appointment with Dr. FLegrand Rams  PLAN -Will follow-up next week.   [facility] ([phone]

## 2019-05-02 ENCOUNTER — Encounter: Payer: Self-pay | Admitting: Gastroenterology

## 2019-05-02 ENCOUNTER — Other Ambulatory Visit (HOSPITAL_COMMUNITY): Payer: Self-pay | Admitting: Internal Medicine

## 2019-05-02 ENCOUNTER — Other Ambulatory Visit: Payer: Self-pay

## 2019-05-02 ENCOUNTER — Other Ambulatory Visit: Payer: Self-pay | Admitting: Internal Medicine

## 2019-05-02 ENCOUNTER — Ambulatory Visit (INDEPENDENT_AMBULATORY_CARE_PROVIDER_SITE_OTHER): Payer: Medicare Other | Admitting: Gastroenterology

## 2019-05-02 VITALS — BP 120/81 | HR 89 | Temp 97.0°F | Ht 63.0 in | Wt 122.2 lb

## 2019-05-02 DIAGNOSIS — R1010 Upper abdominal pain, unspecified: Secondary | ICD-10-CM

## 2019-05-02 DIAGNOSIS — K5900 Constipation, unspecified: Secondary | ICD-10-CM | POA: Diagnosis not present

## 2019-05-02 DIAGNOSIS — M5416 Radiculopathy, lumbar region: Secondary | ICD-10-CM

## 2019-05-02 DIAGNOSIS — E1142 Type 2 diabetes mellitus with diabetic polyneuropathy: Secondary | ICD-10-CM | POA: Diagnosis not present

## 2019-05-02 DIAGNOSIS — M5116 Intervertebral disc disorders with radiculopathy, lumbar region: Secondary | ICD-10-CM | POA: Diagnosis not present

## 2019-05-02 DIAGNOSIS — G5603 Carpal tunnel syndrome, bilateral upper limbs: Secondary | ICD-10-CM | POA: Diagnosis not present

## 2019-05-02 NOTE — Progress Notes (Signed)
Referring Provider: Rosita Fire, MD Primary Care Physician:  Rosita Fire, MD Primary GI: Dr. Gala Romney   Chief Complaint  Patient presents with  . Abdominal Pain    upper abd across, sometimes makes difficult to walk    HPI:   Bryan [name] is a 63 y.o. male presenting today with a history of chronic abdominal pain, constipation, GERD, esophagitis, dysphagia. He reported dysphagia despite empiric dilation in Sept 2018. Known esophagitis. BPE completed without motility issues. No stricture. Due to abdominal pain, HIDA completed and normal with EF 92%. CT without contrast May 2019 with wall thickening throughout sigmoid without focal inflammatory changes, possibly due to underdistension, unable to exclude low grade colitis. (last colonoscopy Sept 2018)I discussed with radiologist and low likelihood of infectious process. Pancreas unremarkable on non-contrast exam. He was hospitalized in July due to abdominal/chest pain. Negative cardiac markers. CTA chest 4 mm nodule in left lower lobe Aug 2019. Recommend non-contrast chest CT in 12 months. History of smoking 1-1.5 ppd X 45 years, no smoking in 4 years. No family history of lung cancer.However, he recently had CTA chest Jan 2020 due to SOB and chest pain, without evidence of lung nodule  115 July 2019, 120 Aug 2019, 127 in Dec 2019. March 2020 134. June 2020 127. Today 122.   States when started taking Prilosec, his pain started. Changed to Protonix at last appt. Aleve intermittently. He was asked to stop Prilosec at last visit. Pain precipitated by movement. Wakes at night with pain. Pain worsened with urinating. On exam, diastasis recti vs ventral hernia with reproduction of pain with palpation. Referred to surgery. He did not complete this appointment.   Ferritin 8 in Oct 2019. Now 29. Hgb 14. Earlier this year with normocytic anemia now resolved.   Pain is getting better now that on Protonix. Goes and comes now. Epigastric pain. Onset  after working around the house.  When having to urinate, pain will start. Ensure helps a little.   Constipation: was taking Linzess 72 mcg, which helped with abdominal pain. States 300$ now. BM every day. Doesn't feel productive every time. Taking Miralax daily. Will take Mag Citrate and pain will go away for a week then come back. Thinks he may have tried Amitiza and caused cramping.   Past Medical History:  Diagnosis Date  . Acid reflux   . Arthritis   . Asthma   . Chronic pain    with leg and back pain (disc problem)  . COPD (chronic obstructive pulmonary disease) ([place])   . Diabetes mellitus without complication ([place])   . Headache    HX OF  . History of upper GI x-ray series    to follow showed large duodenal ulcer H pylori serologies were negative  . Hypertension   . Pneumonia    07/17/17  . Pre-diabetes   . Stroke [facility])    TIA MINI STROKE  . Tubular adenoma     Past Surgical History:  Procedure Laterality Date  . BACK SURGERY  7/05; 5/09    Dr.Hirsch,3 lumbar  X3  . BACK SURGERY    . COLONOSCOPY  Feb 2012   Dr. Gala Romney: normal rectum, pedunculate polyp removed but not recovered  . COLONOSCOPY WITH ESOPHAGOGASTRODUODENOSCOPY (EGD) N/A 11/18/2013   Dr.Rourk- tcs= normal rectum, multipe polyps about the ileocecal valve and distal transverse colon o/w the remainder of the colonic mucosa appeared normal bx= tubular adenoma. EGD= normal esophagus, stomach with scattered erosions mottling, friablility, no ulcer or  infiltrating process patent pylorus bx= chronic inflammation. next TCS 11/2016  . COLONOSCOPY WITH PROPOFOL N/A 05/25/2017   Dr. Gala Romney: sigmoid diverticulosis, one 4 mm hyerplastic rectal polyp, ascending colonic AVMs surveillance 2023  . ESOPHAGOGASTRODUODENOSCOPY  1/06   Dr. Volney American esophageal erosions,U-shaped stomach,marked erosions and edema of the bulb without discrete ulcer disease.   . ESOPHAGOGASTRODUODENOSCOPY (EGD) WITH PROPOFOL N/A 05/25/2017   Dr. Gala Romney:  reflux esophagitis s/p empiric dilation, normal stomach and duodenum  . MALONEY DILATION N/A 05/25/2017   Procedure: Venia Minks DILATION;  Surgeon: Daneil Dolin, MD;  Location: [facility] ENDO SUITE;  Service: Endoscopy;  Laterality: N/A;  . NECK SURGERY  10/2007   PLATE IN NECK  . POLYPECTOMY  05/25/2017   Procedure: POLYPECTOMY;  Surgeon: Daneil Dolin, MD;  Location: [facility] ENDO SUITE;  Service: Endoscopy;;  rectal  . TRANSURETHRAL RESECTION OF PROSTATE N/A 08/17/2017   Procedure: TRANSURETHRAL RESECTION OF THE PROSTATE (TURP);  Surgeon: Irine Seal, MD;  Location: [facility] ORS;  Service: Urology;  Laterality: N/A;    Current Outpatient Medications  Medication Sig Dispense Refill  . albuterol (PROVENTIL HFA;VENTOLIN HFA) 108 (90 Base) MCG/ACT inhaler Inhale 1-2 puffs every 4 (four) hours as needed into the lungs for wheezing or shortness of breath. Do not use with nebulizer (Patient taking differently: Inhale 1 puff into the lungs every 4 (four) hours as needed for wheezing or shortness of breath. Do not use with nebulizer) 1 Inhaler 0  . albuterol (PROVENTIL) (2.5 MG/3ML) 0.083% nebulizer solution Take 3 mLs (2.5 mg total) by nebulization every 4 (four) hours as needed for wheezing or shortness of breath. 75 mL 0  . ergocalciferol (VITAMIN D2) 1.25 MG (50000 UT) capsule Take 50,000 Units by mouth once a week.    . gabapentin (NEURONTIN) 300 MG capsule Take 1 capsule (300 mg total) by mouth 2 (two) times daily. Pt. Says he is taking twice daily. (Patient taking differently: Take 300 mg by mouth 2 (two) times daily. )    . guaiFENesin (MUCINEX) 600 MG 12 hr tablet Take 1 tablet (600 mg total) by mouth 2 (two) times daily. 40 tablet 0  . linaclotide (LINZESS) 72 MCG capsule Take 1 capsule (72 mcg total) by mouth daily before breakfast. (Patient taking differently: Take 72 mcg by mouth daily as needed (for constipation). ) 90 capsule 3  . OXYGEN Inhale 2.5 L into the lungs daily.    . pantoprazole (PROTONIX) 40 MG  tablet Take 1 tablet (40 mg total) by mouth daily before breakfast. 90 tablet 3  . polyethylene glycol powder (MIRALAX) 17 GM/SCOOP powder Take 17 g by mouth daily as needed (FOR CONSTIPATION).     Marland Kitchen tamsulosin (FLOMAX) 0.4 MG CAPS capsule Take 1 capsule (0.4 mg total) by mouth daily after supper. 30 capsule 3  . amLODipine (NORVASC) 5 MG tablet Take 1 tablet (5 mg total) by mouth daily. 30 tablet 11   No current facility-administered medications for this visit.     Allergies as of 05/02/2019  . (No Known Allergies)    Family History  Problem Relation Age of Onset  . Cancer Father   . Asthma Mother   . Colon cancer Neg Hx     Social History   Socioeconomic History  . Marital status: Single    Spouse name: Not on file  . Number of children: 2  . Years of education: Not on file  . Highest education level: Not on file  Occupational History  . Occupation: disabled  Employer: UNEMPLOYED  Social Needs  . Financial resource strain: Not on file  . Food insecurity    Worry: Not on file    Inability: Not on file  . Transportation needs    Medical: Not on file    Non-medical: Not on file  Tobacco Use  . Smoking status: Former Smoker    Packs/day: 0.50    Years: 40.00    Pack years: 20.00    Types: Cigarettes    Quit date: 11/11/2014    Years since quitting: 4.4  . Smokeless tobacco: Never Used  Substance and Sexual Activity  . Alcohol use: No    Alcohol/week: 0.0 standard drinks  . Drug use: Yes    Types: Marijuana    Comment: 02/15/19-"hit one 3 days ago"  . Sexual activity: Not Currently    Birth control/protection: None  Lifestyle  . Physical activity    Days per week: Not on file    Minutes per session: Not on file  . Stress: Not on file  Relationships  . Social Herbalist on phone: Not on file    Gets together: Not on file    Attends religious service: Not on file    Active member of club or organization: Not on file    Attends meetings of clubs or  organizations: Not on file    Relationship status: Not on file  Other Topics Concern  . Not on file  Social History Narrative   Lives with girlfriend, is on disability for history of back injuries.    Review of Systems: Gen: see HPI CV: Denies chest pain, palpitations, syncope, peripheral edema, and claudication. Resp: Denies dyspnea at rest, cough, wheezing, coughing up blood, and pleurisy. GI: see HPI Derm: Denies rash, itching, dry skin Psych: Denies depression, anxiety, memory loss, confusion. No homicidal or suicidal ideation.  Heme: Denies bruising, bleeding, and enlarged lymph nodes.  Physical Exam: BP 120/81   Pulse 89   Temp (!) 97 F (36.1 C) (Oral)   Ht _0  (1.6 m)   Wt 122 lb 3.2 oz (55.4 kg)   BMI 21.65 kg/m  General:   Alert and oriented. No distress noted. Pleasant and cooperative.  Head:  Normocephalic and atraumatic. Eyes:  Conjuctiva clear without scleral icterus. Mouth:  Oral mucosa pink and moist.  Abdomen:  +BS, soft,  Mild TTP midline abdomen, rectus diastasis and non-distended. No rebound or guarding. No HSM or masses noted. Msk:  Symmetrical without gross deformities. Normal posture. Extremities:  Without edema. Neurologic:  Alert and  oriented x4 Psych:  Alert and cooperative. Normal mood and affect.

## 2019-05-02 NOTE — Patient Instructions (Addendum)
Please start taking Amitiza 1 gelcap WITH FOOD twice a day to avoid nausea. I have given you samples. Let me know how this works next week.  We will see you in 3 months!  I enjoyed seeing you again today! As you know, I value our relationship and want to provide genuine, compassionate, and quality care. I welcome your feedback. If you receive a survey regarding your visit,  I greatly appreciate you taking time to fill this out. See you next time!  [name], PhD, ANP-BC [facility]

## 2019-05-07 NOTE — Assessment & Plan Note (Signed)
Chronic abdominal pain likely multifactorial with both constipation playing a role and musculoskeletal component. Notable improvement with adequate bowel regimen but unable to afford Linzess now. Trial Amitiza 8 mcg po BID. Samples provided. Discontinuing Prilosec and starting Protonix has also helped with improvement. Return in 3 months. Call with update on Amitiza samples.

## 2019-05-07 NOTE — Assessment & Plan Note (Signed)
Amitiza 8 mcg samples provided. May need to increase dosage. Await to hear from patient progress report.

## 2019-05-09 DIAGNOSIS — J4520 Mild intermittent asthma, uncomplicated: Secondary | ICD-10-CM | POA: Diagnosis not present

## 2019-05-09 DIAGNOSIS — J449 Chronic obstructive pulmonary disease, unspecified: Secondary | ICD-10-CM | POA: Diagnosis not present

## 2019-05-09 DIAGNOSIS — R062 Wheezing: Secondary | ICD-10-CM | POA: Diagnosis not present

## 2019-05-09 DIAGNOSIS — J441 Chronic obstructive pulmonary disease with (acute) exacerbation: Secondary | ICD-10-CM | POA: Diagnosis not present

## 2019-05-14 ENCOUNTER — Other Ambulatory Visit: Payer: Self-pay

## 2019-05-14 NOTE — Patient Outreach (Signed)
[place] [facility]) Care Management  05/14/2019  Bryan [name] 11-Jul-1956 [id number]   Attempted follow-up outreach with Mr. [place]. Left voice message requesting a return call.  PLAN -Will follow-up within 3-4 business days.  [place] Care Management [phone]

## 2019-05-21 ENCOUNTER — Ambulatory Visit (HOSPITAL_COMMUNITY): Payer: Medicare Other

## 2019-05-21 DIAGNOSIS — R062 Wheezing: Secondary | ICD-10-CM | POA: Diagnosis not present

## 2019-05-21 DIAGNOSIS — J4520 Mild intermittent asthma, uncomplicated: Secondary | ICD-10-CM | POA: Diagnosis not present

## 2019-05-21 DIAGNOSIS — J449 Chronic obstructive pulmonary disease, unspecified: Secondary | ICD-10-CM | POA: Diagnosis not present

## 2019-05-21 DIAGNOSIS — J441 Chronic obstructive pulmonary disease with (acute) exacerbation: Secondary | ICD-10-CM | POA: Diagnosis not present

## 2019-05-22 ENCOUNTER — Other Ambulatory Visit: Payer: Self-pay

## 2019-05-22 NOTE — Patient Outreach (Signed)
[place] [facility]) Care Management  05/22/2019  Bryan [name] [birth year] [id number]     Follow-up outreach with Bryan [name] regarding weakness and hip pain. He reports doing well over the past two weeks. He reports completing the office visit with Dr. Legrand Rams as scheduled on 05/02/19. Reports ambulating well and using his cane as needed. Denies complaints of hip pain or weakness today. No complaints of shortness of breath or chest discomfort. Denies feeling faint or dizzy. Denies falls.  He continues to take medications as prescribed and reports compliance with treatment recommendations. He was scheduled for an MRI on yesterday. Reports appointment was rescheduled for 06/20/19. He is aware of worsening s/sx and indications for seeking immediate medical attention.   Bryan [name] denies immediate care management needs. Denies need for transportation assistance. Reports his sister is still available to assist as needed. Will continue routine outreach.  Current Outpatient Medications on File Prior to Visit  Medication Sig Dispense Refill  . albuterol (PROVENTIL HFA;VENTOLIN HFA) 108 (90 Base) MCG/ACT inhaler Inhale 1-2 puffs every 4 (four) hours as needed into the lungs for wheezing or shortness of breath. Do not use with nebulizer (Patient taking differently: Inhale 1 puff into the lungs every 4 (four) hours as needed for wheezing or shortness of breath. Do not use with nebulizer) 1 Inhaler 0  . albuterol (PROVENTIL) (2.5 MG/3ML) 0.083% nebulizer solution Take 3 mLs (2.5 mg total) by nebulization every 4 (four) hours as needed for wheezing or shortness of breath. 75 mL 0  . amLODipine (NORVASC) 5 MG tablet Take 1 tablet (5 mg total) by mouth daily. 30 tablet 11  . ergocalciferol (VITAMIN D2) 1.25 MG (50000 UT) capsule Take 50,000 Units by mouth once a week.    . gabapentin (NEURONTIN) 300 MG capsule Take 1 capsule (300 mg total) by mouth 2 (two) times daily. Pt. Says he is taking twice daily.  (Patient taking differently: Take 300 mg by mouth 2 (two) times daily. )    . guaiFENesin (MUCINEX) 600 MG 12 hr tablet Take 1 tablet (600 mg total) by mouth 2 (two) times daily. 40 tablet 0  . linaclotide (LINZESS) 72 MCG capsule Take 1 capsule (72 mcg total) by mouth daily before breakfast. (Patient taking differently: Take 72 mcg by mouth daily as needed (for constipation). ) 90 capsule 3  . OXYGEN Inhale 2.5 L into the lungs daily.    . pantoprazole (PROTONIX) 40 MG tablet Take 1 tablet (40 mg total) by mouth daily before breakfast. 90 tablet 3  . polyethylene glycol powder (MIRALAX) 17 GM/SCOOP powder Take 17 g by mouth daily as needed (FOR CONSTIPATION).     Marland Kitchen tamsulosin (FLOMAX) 0.4 MG CAPS capsule Take 1 capsule (0.4 mg total) by mouth daily after supper. 30 capsule 3   No current facility-administered medications on file prior to visit.     PLAN -Will continue routine outreach.  [place] Care Management [phone]

## 2019-05-28 ENCOUNTER — Other Ambulatory Visit: Payer: Self-pay

## 2019-05-28 NOTE — Patient Outreach (Signed)
[place] [facility]) Care Management  05/28/2019  ULRICH SOULES [birth year] [id number]    Call received from Mr. Kassel. He requested assistance with contacting [place] for home oxygen. Indicated that all the portable oxygen cylinders are empty.  [place]. Per staff, the request for new cylinders will be submitted today.  PLAN -Mr. Hounshell is agreeable to f/u to confirm delivery within 24 hours.   [place] Care Management [phone]

## 2019-05-29 ENCOUNTER — Other Ambulatory Visit: Payer: Medicare Other

## 2019-05-29 NOTE — Patient Outreach (Signed)
[facility] [facility]) Care Management  05/29/2019  JONAVEN HILGERS 29-Feb-1956 [id number]   Follow-up outreach with Mr. Walker. He confirmed delivery of oxygen tanks and home refill device from [place]. Denied additional home supply needs.  PLAN -Will continue routine outreach.   [place] Care Management [phone]

## 2019-06-02 DIAGNOSIS — I10 Essential (primary) hypertension: Secondary | ICD-10-CM | POA: Diagnosis not present

## 2019-06-02 DIAGNOSIS — K219 Gastro-esophageal reflux disease without esophagitis: Secondary | ICD-10-CM | POA: Diagnosis not present

## 2019-06-03 DIAGNOSIS — G5620 Lesion of ulnar nerve, unspecified upper limb: Secondary | ICD-10-CM | POA: Diagnosis not present

## 2019-06-03 DIAGNOSIS — M545 Low back pain: Secondary | ICD-10-CM | POA: Diagnosis not present

## 2019-06-03 DIAGNOSIS — G5600 Carpal tunnel syndrome, unspecified upper limb: Secondary | ICD-10-CM | POA: Diagnosis not present

## 2019-06-03 DIAGNOSIS — M79606 Pain in leg, unspecified: Secondary | ICD-10-CM | POA: Diagnosis not present

## 2019-06-09 ENCOUNTER — Encounter (HOSPITAL_COMMUNITY): Payer: Self-pay | Admitting: Emergency Medicine

## 2019-06-09 ENCOUNTER — Other Ambulatory Visit: Payer: Self-pay

## 2019-06-09 ENCOUNTER — Emergency Department (HOSPITAL_COMMUNITY)
Admission: EM | Admit: 2019-06-09 | Discharge: 2019-06-09 | Disposition: A | Discharge status: Home / Self Care | Payer: Medicare Other | Attending: Emergency Medicine | Admitting: Emergency Medicine

## 2019-06-09 ENCOUNTER — Emergency Department (HOSPITAL_COMMUNITY): Payer: Medicare Other

## 2019-06-09 DIAGNOSIS — K3189 Other diseases of stomach and duodenum: Secondary | ICD-10-CM | POA: Diagnosis not present

## 2019-06-09 DIAGNOSIS — Z8673 Personal history of transient ischemic attack (TIA), and cerebral infarction without residual deficits: Secondary | ICD-10-CM | POA: Insufficient documentation

## 2019-06-09 DIAGNOSIS — J449 Chronic obstructive pulmonary disease, unspecified: Secondary | ICD-10-CM | POA: Insufficient documentation

## 2019-06-09 DIAGNOSIS — J4520 Mild intermittent asthma, uncomplicated: Secondary | ICD-10-CM | POA: Diagnosis not present

## 2019-06-09 DIAGNOSIS — E1122 Type 2 diabetes mellitus with diabetic chronic kidney disease: Secondary | ICD-10-CM | POA: Diagnosis not present

## 2019-06-09 DIAGNOSIS — Z87891 Personal history of nicotine dependence: Secondary | ICD-10-CM | POA: Insufficient documentation

## 2019-06-09 DIAGNOSIS — Z20828 Contact with and (suspected) exposure to other viral communicable diseases: Secondary | ICD-10-CM | POA: Insufficient documentation

## 2019-06-09 DIAGNOSIS — J441 Chronic obstructive pulmonary disease with (acute) exacerbation: Secondary | ICD-10-CM | POA: Diagnosis not present

## 2019-06-09 DIAGNOSIS — Z79899 Other long term (current) drug therapy: Secondary | ICD-10-CM | POA: Diagnosis not present

## 2019-06-09 DIAGNOSIS — R062 Wheezing: Secondary | ICD-10-CM | POA: Diagnosis not present

## 2019-06-09 DIAGNOSIS — R0602 Shortness of breath: Secondary | ICD-10-CM | POA: Diagnosis not present

## 2019-06-09 DIAGNOSIS — R140 Abdominal distension (gaseous): Secondary | ICD-10-CM | POA: Diagnosis not present

## 2019-06-09 DIAGNOSIS — R1010 Upper abdominal pain, unspecified: Secondary | ICD-10-CM | POA: Insufficient documentation

## 2019-06-09 DIAGNOSIS — N183 Chronic kidney disease, stage 3 (moderate): Secondary | ICD-10-CM | POA: Insufficient documentation

## 2019-06-09 DIAGNOSIS — I129 Hypertensive chronic kidney disease with stage 1 through stage 4 chronic kidney disease, or unspecified chronic kidney disease: Secondary | ICD-10-CM | POA: Diagnosis not present

## 2019-06-09 DIAGNOSIS — R1013 Epigastric pain: Secondary | ICD-10-CM | POA: Diagnosis not present

## 2019-06-09 LAB — COMPREHENSIVE METABOLIC PANEL
ALT: 14 U/L (ref 0–44)
AST: 18 U/L (ref 15–41)
Albumin: 3.7 g/dL (ref 3.5–5.0)
Alkaline Phosphatase: 96 U/L (ref 38–126)
Anion gap: 9 (ref 5–15)
BUN: 16 mg/dL (ref 8–23)
CO2: 30 mmol/L (ref 22–32)
Calcium: 8.8 mg/dL — ABNORMAL LOW (ref 8.9–10.3)
Chloride: 101 mmol/L (ref 98–111)
Creatinine, Ser: 1.66 mg/dL — ABNORMAL HIGH (ref 0.61–1.24)
GFR calc Af Amer: 50 mL/min — ABNORMAL LOW (ref >60)
GFR calc non Af Amer: 43 mL/min — ABNORMAL LOW (ref >60)
Glucose, Bld: 113 mg/dL — ABNORMAL HIGH (ref 70–99)
Potassium: 4.1 mmol/L (ref 3.5–5.1)
Sodium: 140 mmol/L (ref 135–145)
Total Bilirubin: 0.6 mg/dL (ref 0.3–1.2)
Total Protein: 7.4 g/dL (ref 6.5–8.1)

## 2019-06-09 LAB — CBC WITH DIFFERENTIAL/PLATELET
Abs Immature Granulocytes: 0.02 10*3/uL (ref 0.00–0.07)
Basophils Absolute: 0.1 10*3/uL (ref 0.0–0.1)
Basophils Relative: 1 %
Eosinophils Absolute: 0.2 10*3/uL (ref 0.0–0.5)
Eosinophils Relative: 2 %
HCT: 44.8 % (ref 39.0–52.0)
Hemoglobin: 13.1 g/dL (ref 13.0–17.0)
Immature Granulocytes: 0 %
Lymphocytes Relative: 22 %
Lymphs Abs: 1.7 10*3/uL (ref 0.7–4.0)
MCH: 25.3 pg — ABNORMAL LOW (ref 26.0–34.0)
MCHC: 29.2 g/dL — ABNORMAL LOW (ref 30.0–36.0)
MCV: 86.5 fL (ref 80.0–100.0)
Monocytes Absolute: 1 10*3/uL (ref 0.1–1.0)
Monocytes Relative: 14 %
Neutro Abs: 4.8 10*3/uL (ref 1.7–7.7)
Neutrophils Relative %: 61 %
Platelets: 206 10*3/uL (ref 150–400)
RBC: 5.18 MIL/uL (ref 4.22–5.81)
RDW: 20.9 % — ABNORMAL HIGH (ref 11.5–15.5)
WBC: 7.7 10*3/uL (ref 4.0–10.5)
nRBC: 0 % (ref 0.0–0.2)

## 2019-06-09 LAB — TROPONIN I (HIGH SENSITIVITY)
Troponin I (High Sensitivity): 6 ng/L (ref <18)
Troponin I (High Sensitivity): 6 ng/L (ref <18)

## 2019-06-09 LAB — LIPASE, BLOOD: Lipase: 120 U/L — ABNORMAL HIGH (ref 11–51)

## 2019-06-09 LAB — SARS CORONAVIRUS 2 BY RT PCR (HOSPITAL ORDER, PERFORMED IN ~~LOC~~ HOSPITAL LAB): SARS Coronavirus 2: NEGATIVE

## 2019-06-09 MED ORDER — METHYLPREDNISOLONE SODIUM SUCC 125 MG IJ SOLR
125.0000 mg | Freq: Once | INTRAMUSCULAR | Status: AC
  Administered 2019-06-09: 18:00:00 125 mg via INTRAVENOUS
  Filled 2019-06-09: qty 2

## 2019-06-09 MED ORDER — TRAMADOL HCL 50 MG PO TABS: 50.0000 mg | ORAL_TABLET | Freq: Four times a day (QID) | ORAL | 0 refills | Status: DC | PRN

## 2019-06-09 MED ORDER — ALBUTEROL SULFATE HFA 108 (90 BASE) MCG/ACT IN AERS
6.0000 | INHALATION_SPRAY | RESPIRATORY_TRACT | Status: DC | PRN
  Administered 2019-06-09: 18:00:00 6 via RESPIRATORY_TRACT
  Filled 2019-06-09: qty 6.7

## 2019-06-09 MED ORDER — PANTOPRAZOLE SODIUM 40 MG IV SOLR
40.0000 mg | Freq: Once | INTRAVENOUS | Status: AC
  Administered 2019-06-09: 40 mg via INTRAVENOUS
  Filled 2019-06-09: qty 40

## 2019-06-09 MED ORDER — MAGNESIUM SULFATE 2 GM/50ML IV SOLN
2.0000 g | Freq: Once | INTRAVENOUS | Status: AC
  Administered 2019-06-09: 2 g via INTRAVENOUS
  Filled 2019-06-09: qty 50

## 2019-06-09 MED ORDER — ONDANSETRON 4 MG PO TBDP: ORAL_TABLET | ORAL | 0 refills | Status: DC

## 2019-06-09 MED ORDER — IOHEXOL 300 MG/ML  SOLN
100.0000 mL | Freq: Once | INTRAMUSCULAR | Status: AC | PRN
  Administered 2019-06-09: 19:00:00 100 mL via INTRAVENOUS

## 2019-06-09 NOTE — ED Notes (Addendum)
Wheeled pt out to family car in wheelchair. Pt family forgot pt home o2 take. Pt very insistent on leaving and going home stating "I will be alright, I live close by." this nurse advised pt that he needs to get his oxygen on as soon as he gets home.

## 2019-06-09 NOTE — ED Provider Notes (Signed)
[facility] Provider Note   CSN: [id number] Arrival date & time: 06/09/19  1746     History   Chief Complaint Chief Complaint  Patient presents with  . Abdominal Pain  . Shortness of Breath    HPI Bryan [name] is a 63 y.o. male.     Patient complains of some shortness of breath and epigastric discomfort.  Patient has a history of COPD  The history is provided by the patient. No language interpreter was used.  Abdominal Pain Pain location:  Epigastric Pain quality: aching   Pain radiates to:  Does not radiate Pain severity:  Mild Onset quality:  Gradual Timing:  Constant Progression:  Worsening Chronicity:  New Context: not alcohol use   Relieved by:  Nothing Associated symptoms: shortness of breath   Associated symptoms: no chest pain, no cough, no diarrhea, no fatigue and no hematuria   Shortness of Breath Associated symptoms: abdominal pain   Associated symptoms: no chest pain, no cough, no headaches and no rash     Past Medical History:  Diagnosis Date  . Acid reflux   . Arthritis   . Asthma   . Chronic pain    with leg and back pain (disc problem)  . COPD (chronic obstructive pulmonary disease) ([place])   . Diabetes mellitus without complication ([place])   . Headache    HX OF  . History of upper GI x-ray series    to follow showed large duodenal ulcer H pylori serologies were negative  . Hypertension   . Pneumonia    07/17/17  . Pre-diabetes   . Stroke [facility])    TIA MINI STROKE  . Tubular adenoma     Patient Active Problem List   Diagnosis Date Noted  . Normocytic anemia 02/15/2019  . COPD exacerbation ([place]) 09/16/2018  . Hypokalemia 06/26/2018  . Hypomagnesemia 06/26/2018  . Anemia 06/26/2018  . BPH (benign prostatic hyperplasia) 06/26/2018  . Chronic respiratory failure with hypoxia ([place]) 06/04/2018  . HCAP (healthcare-associated pneumonia) 06/04/2018  . Acute respiratory failure with hypoxia ([place]) 04/14/2018  . Pulmonary  nodule 04/14/2018  . CKD (chronic kidney disease), stage III ([place]) 04/14/2018  . Type 2 diabetes mellitus with hyperlipidemia ([place]) 03/12/2018  . BPH with obstruction/lower urinary tract symptoms 08/17/2017  . Left sided numbness 07/22/2017  . TIA (transient ischemic attack) 07/22/2017  . Pneumonia 07/14/2017  . COPD with acute exacerbation ([place]) 07/14/2017  . Constipation 07/10/2017  . Hx of adenomatous colonic polyps 05/01/2017  . Hypertension   . Chronic pain   . Heme positive stool 10/20/2014  . Epigastric pain 06/19/2014  . Dysphagia 06/19/2014  . AP (abdominal pain) 11/13/2013  . Early satiety 11/13/2013  . COPD, severe ([place]) 02/22/2013  . Low back pain 11/19/2012  . COLITIS 05/12/2009  . ABDOMINAL PAIN, LEFT LOWER QUADRANT 05/12/2009  . DUODENAL ULCER, HX OF 05/12/2009  . ALCOHOL USE 05/11/2009  . GERD 05/11/2009  . Diarrhea 05/11/2009  . SPONDYLOSIS, CERVICAL 07/03/2007  . NECK PAIN 07/03/2007    Past Surgical History:  Procedure Laterality Date  . BACK SURGERY  7/05; 5/09    Dr.Hirsch,3 lumbar  X3  . BACK SURGERY    . COLONOSCOPY  Feb 2012   Dr. Gala Romney: normal rectum, pedunculate polyp removed but not recovered  . COLONOSCOPY WITH ESOPHAGOGASTRODUODENOSCOPY (EGD) N/A 11/18/2013   Dr.Rourk- tcs= normal rectum, multipe polyps about the ileocecal valve and distal transverse colon o/w the remainder of the colonic mucosa appeared normal bx=  tubular adenoma. EGD= normal esophagus, stomach with scattered erosions mottling, friablility, no ulcer or infiltrating process patent pylorus bx= chronic inflammation. next TCS 11/2016  . COLONOSCOPY WITH PROPOFOL N/A 05/25/2017   Dr. Gala Romney: sigmoid diverticulosis, one 4 mm hyerplastic rectal polyp, ascending colonic AVMs surveillance 2023  . ESOPHAGOGASTRODUODENOSCOPY  1/06   Dr. Volney American esophageal erosions,U-shaped stomach,marked erosions and edema of the bulb without discrete ulcer disease.   . ESOPHAGOGASTRODUODENOSCOPY (EGD)  WITH PROPOFOL N/A 05/25/2017   Dr. Gala Romney: reflux esophagitis s/p empiric dilation, normal stomach and duodenum  . MALONEY DILATION N/A 05/25/2017   Procedure: Venia Minks DILATION;  Surgeon: Daneil Dolin, MD;  Location: [facility] ENDO SUITE;  Service: Endoscopy;  Laterality: N/A;  . NECK SURGERY  10/2007   PLATE IN NECK  . POLYPECTOMY  05/25/2017   Procedure: POLYPECTOMY;  Surgeon: Daneil Dolin, MD;  Location: [facility] END[facility] SUITE;  Service: Endoscopy;;  rectal  . TRANSURETHRAL RESECTION OF PROSTATE N/A 08/17/2017   Procedure: TRANSURETHRAL RESECTION OF THE PROSTATE (TURP);  Surgeon: Irine Seal, MD;  Location: [facility] ORS;  Service: Urology;  Laterality: N/A;        Home Medications    Prior to Admission medications   Medication Sig Start Date End Date Taking? Authorizing Provider  albuterol (PROVENTIL HFA;VENTOLIN HFA) 108 (90 Base) MCG/ACT inhaler Inhale 1-2 puffs every 4 (four) hours as needed into the lungs for wheezing or shortness of breath. Do not use with nebulizer Patient taking differently: Inhale 1 puff into the lungs every 4 (four) hours as needed for wheezing or shortness of breath. Do not use with nebulizer 07/17/17   [name], MD  albuterol (PROVENTIL) (2.5 MG/3ML) 0.083% nebulizer solution Take 3 mLs (2.5 mg total) by nebulization every 4 (four) hours as needed for wheezing or shortness of breath. 06/29/18   [name], MD  amLODipine (NORVASC) 5 MG tablet Take 1 tablet (5 mg total) by mouth daily. 03/13/18 03/13/19  [name], MD  ergocalciferol (VITAMIN D2) 1.25 MG (50000 UT) capsule Take 50,000 Units by mouth once a week. 04/29/19   Phillips Odor, MD  gabapentin (NEURONTIN) 300 MG capsule Take 1 capsule (300 mg total) by mouth 2 (two) times daily. Pt. Says he is taking twice daily. Patient taking differently: Take 300 mg by mouth 2 (two) times daily.  06/05/18   Barton Dubois, MD  guaiFENesin (MUCINEX) 600 MG 12 hr tablet Take 1 tablet (600 mg total) by mouth 2 (two) times  daily. 06/29/18 06/29/19  [name], MD  linaclotide (LINZESS) 72 MCG capsule Take 1 capsule (72 mcg total) by mouth daily before breakfast. Patient taking differently: Take 72 mcg by mouth daily as needed (for constipation).  05/08/18   [name], NP  ondansetron (ZOFRAN ODT) 4 MG disintegrating tablet 50m ODT q4 hours prn nausea/vomit 06/09/19   ZMilton Ferguson MD  OXYGEN Inhale 2.5 L into the lungs daily.    [provider]  pantoprazole (PROTONIX) 40 MG tablet Take 1 tablet (40 mg total) by mouth daily before breakfast. 02/15/19   BAnnitta Needs NP  polyethylene glycol powder (MIRALAX) 17 GM/SCOOP powder Take 17 g by mouth daily as needed (FOR CONSTIPATION).  11/14/18   [provider]  tamsulosin (FLOMAX) 0.4 MG CAPS capsule Take 1 capsule (0.4 mg total) by mouth daily after supper. 09/18/18   Rai, RVernelle Emerald MD  traMADol (ULTRAM) 50 MG tablet Take 1 tablet (50 mg total) by mouth every 6 (six) hours as needed. 06/09/19  Milton Ferguson, MD    Family History Family History  Problem Relation Age of Onset  . Cancer Father   . Asthma Mother   . Colon cancer Neg Hx     Social History Social History   Tobacco Use  . Smoking status: Former Smoker    Packs/day: 0.50    Years: 40.00    Pack years: 20.00    Types: Cigarettes    Quit date: 11/11/2014    Years since quitting: 4.5  . Smokeless tobacco: Never Used  Substance Use Topics  . Alcohol use: No    Alcohol/week: 0.0 standard drinks  . Drug use: Yes    Types: Marijuana    Comment: 02/15/19-"hit one 3 days ago"     Allergies   Patient has no known allergies.   Review of Systems Review of Systems  Constitutional: Negative for appetite change and fatigue.  HENT: Negative for congestion, ear discharge and sinus pressure.   Eyes: Negative for discharge.  Respiratory: Positive for shortness of breath. Negative for cough.   Cardiovascular: Negative for chest pain.  Gastrointestinal: Positive for abdominal  pain. Negative for diarrhea.  Genitourinary: Negative for frequency and hematuria.  Musculoskeletal: Negative for back pain.  Skin: Negative for rash.  Neurological: Negative for seizures and headaches.  Psychiatric/Behavioral: Negative for hallucinations.     Physical Exam Updated Vital Signs BP (!) 136/98   Pulse 91   Temp 98.6 F (37 C) (Oral)   Resp (!) 26   Ht _0  (1.575 m)   Wt 57.2 kg   SpO2 95%   BMI 23.05 kg/m   Physical Exam Vitals signs and nursing note reviewed.  Constitutional:      Appearance: He is well-developed.  HENT:     Head: Normocephalic.     Nose: Nose normal.  Eyes:     General: No scleral icterus.    Conjunctiva/sclera: Conjunctivae normal.  Neck:     Musculoskeletal: Neck supple.     Thyroid: No thyromegaly.  Cardiovascular:     Rate and Rhythm: Normal rate and regular rhythm.     Heart sounds: No murmur. No friction rub. No gallop.   Pulmonary:     Breath sounds: No stridor. No wheezing or rales.  Chest:     Chest wall: No tenderness.  Abdominal:     General: There is no distension.     Tenderness: There is no abdominal tenderness. There is no rebound.  Musculoskeletal: Normal range of motion.  Lymphadenopathy:     Cervical: No cervical adenopathy.  Skin:    Findings: No erythema or rash.  Neurological:     Mental Status: He is oriented to person, place, and time.     Motor: No abnormal muscle tone.     Coordination: Coordination normal.  Psychiatric:        Behavior: Behavior normal.      ED Treatments / Results  Labs (all labs ordered are listed, but only abnormal results are displayed) Labs Reviewed  CBC WITH DIFFERENTIAL/PLATELET - Abnormal; Notable for the following components:      Result Value   MCH 25.3 (*)    MCHC 29.2 (*)    RDW 20.9 (*)    All other components within normal limits  COMPREHENSIVE METABOLIC PANEL - Abnormal; Notable for the following components:   Glucose, Bld 113 (*)    Creatinine, Ser 1.66  (*)    Calcium 8.8 (*)    GFR calc non Af Amer 43 (*)  GFR calc Af Amer 50 (*)    All other components within normal limits  LIPASE, BLOOD - Abnormal; Notable for the following components:   Lipase 120 (*)    All other components within normal limits  SARS CORONAVIRUS 2 (HOSPITAL ORDER, [facility])  TROPONIN I (HIGH SENSITIVITY)  TROPONIN I (HIGH SENSITIVITY)    EKG EKG Interpretation  Date/Time:  Sunday June 09 2019 17:55:18 EDT Ventricular Rate:  91 PR Interval:    QRS Duration: 109 QT Interval:  377 QTC Calculation: 464 R Axis:   88 Text Interpretation:  Sinus rhythm Borderline short PR interval Borderline right axis deviation Borderline low voltage, extremity leads Confirmed by Milton Ferguson [phone]) on 06/09/2019 9:57:15 PM   Radiology Ct Abdomen Pelvis W Contrast  Result Date: 06/09/2019 CLINICAL DATA:  63 year old male with shortness of breath and abdominal distension. EXAM: CT ABDOMEN AND PELVIS WITH CONTRAST TECHNIQUE: Multidetector CT imaging of the abdomen and pelvis was performed using the standard protocol following bolus administration of intravenous contrast. CONTRAST:  142m OMNIPAQUE IOHEXOL 300 MG/ML  SOLN COMPARISON:  CT Abdomen and Pelvis 05/23/2018 and earlier. FINDINGS: The patient was scanned in the right lateral decubitus position. Lower chest: Mild scarring at the lung bases appears chronic. Mild respiratory motion. There is anterior basal segment right lower lobe airway thickening on series 5, image 4. No associated pulmonary opacity. No pericardial or pleural effusion. Hepatobiliary: Contracted and negative gallbladder. Negative liver. No bile duct enlargement. Pancreas: Negative. Spleen: Negative. Adrenals/Urinary Tract: Normal adrenal glands. Bilateral renal enhancement and contrast excretion is symmetric and normal. Decompressed proximal ureters. No nephrolithiasis. Diminutive and unremarkable urinary bladder. Stomach/Bowel:  Descending and rectosigmoid colon segments are within normal limits. Likewise, no definite transverse or right colon inflammation. Normal appendix which crosses midline to the left on series 3, image 45. Decompressed and negative terminal ileum. No dilated small bowel. The stomach is moderately distended with gas today, previously was distended with food debris. There is food debris in the antrum. No perigastric inflammation. Duodenum appears negative. No free air, free fluid. Vascular/Lymphatic: Aortoiliac calcified atherosclerosis. Major arterial structures remain patent. Portal venous system is patent. No lymphadenopathy. Reproductive: Negative. Other: No pelvic free fluid. Musculoskeletal: Chronic lumbar spine degeneration and left-side unilateral lumbar fusion. No acute osseous abnormality identified. IMPRESSION: 1. No acute or inflammatory process identified in the abdomen or pelvis. Normal appendix. 2. Partially visible right lower lobe airway thickening, consider acute bronchitis. Otherwise stable lung bases with mild chronic scarring. 3. Aortic Atherosclerosis (ICD10-I70.0). Electronically Signed   By: HGenevie AnnM.D.   On: 06/09/2019 19:44    Procedures Procedures (including critical care time)  Medications Ordered in ED Medications  albuterol (VENTOLIN HFA) 108 (90 Base) MCG/ACT inhaler 6 puff (6 puffs Inhalation Given 06/09/19 1826)  methylPREDNISolone sodium succinate (SOLU-MEDROL) 125 mg/2 mL injection 125 mg (125 mg Intravenous Given 06/09/19 1827)  pantoprazole (PROTONIX) injection 40 mg (40 mg Intravenous Given 06/09/19 1827)  magnesium sulfate IVPB 2 g 50 mL (0 g Intravenous Stopped 06/09/19 1928)  iohexol (OMNIPAQUE) 300 MG/ML solution 100 mL (100 mLs Intravenous Contrast Given 06/09/19 1854)     Initial Impression / Assessment and Plan / ED Course  I have reviewed the triage vital signs and the nursing notes.  Pertinent labs & imaging results that were available during my care of the  patient were reviewed by me and considered in my medical decision making (see chart for details).  Patient with mildly elevated lipase.  CT of abdomen negative.  Patient also with COPD that improved with treatment.  Patient had mild exacerbation of his COPD and mild elevation of lipase possible pancreatitis not seen on CT.  Patient will increase his Protonix to twice a day is given some Ultram and Zofran and is told to take liquids only for 2 days and follow-up with his GI doctor this week  Final Clinical Impressions(s) / ED Diagnoses   Final diagnoses:  Pain of upper abdomen    ED Discharge Orders         Ordered    ondansetron (ZOFRAN ODT) 4 MG disintegrating tablet     06/09/19 2158    traMADol (ULTRAM) 50 MG tablet  Every 6 hours PRN     06/09/19 2159           Milton Ferguson, MD 06/09/19 2202

## 2019-06-09 NOTE — Discharge Instructions (Signed)
Increase your Protonix so you are taking it twice a day.  Take liquids only for 2 days.  And follow-up with your stomach doctor this week

## 2019-06-09 NOTE — ED Triage Notes (Signed)
Pt states he has been sob all day, even with home oxygen. Has not been able to go even a few feet without it. Came in from car without oxygen and could hardly breathe. Sats were 80% and improved with 4L. Pt also c/o abd pain and distention x 4 days.

## 2019-06-09 NOTE — ED Notes (Signed)
Pt given water to drink.

## 2019-06-09 NOTE — ED Notes (Signed)
EDP at bedside.

## 2019-06-12 ENCOUNTER — Other Ambulatory Visit: Payer: Self-pay | Admitting: Pharmacist

## 2019-06-13 ENCOUNTER — Encounter (HOSPITAL_COMMUNITY): Payer: Self-pay

## 2019-06-13 ENCOUNTER — Observation Stay (HOSPITAL_BASED_OUTPATIENT_CLINIC_OR_DEPARTMENT_OTHER): Payer: Medicare Other

## 2019-06-13 ENCOUNTER — Other Ambulatory Visit: Payer: Self-pay

## 2019-06-13 ENCOUNTER — Emergency Department (HOSPITAL_COMMUNITY): Payer: Medicare Other

## 2019-06-13 ENCOUNTER — Observation Stay (HOSPITAL_COMMUNITY)
Admission: EM | Admit: 2019-06-13 | Discharge: 2019-06-14 | Disposition: A | Discharge status: Home / Self Care | Payer: Medicare Other | Attending: Family Medicine | Admitting: Family Medicine

## 2019-06-13 DIAGNOSIS — Z7984 Long term (current) use of oral hypoglycemic drugs: Secondary | ICD-10-CM | POA: Insufficient documentation

## 2019-06-13 DIAGNOSIS — N138 Other obstructive and reflux uropathy: Secondary | ICD-10-CM | POA: Diagnosis present

## 2019-06-13 DIAGNOSIS — Z79899 Other long term (current) drug therapy: Secondary | ICD-10-CM | POA: Diagnosis not present

## 2019-06-13 DIAGNOSIS — Z8673 Personal history of transient ischemic attack (TIA), and cerebral infarction without residual deficits: Secondary | ICD-10-CM | POA: Diagnosis not present

## 2019-06-13 DIAGNOSIS — I5033 Acute on chronic diastolic (congestive) heart failure: Secondary | ICD-10-CM | POA: Diagnosis not present

## 2019-06-13 DIAGNOSIS — R0602 Shortness of breath: Secondary | ICD-10-CM | POA: Diagnosis not present

## 2019-06-13 DIAGNOSIS — E1122 Type 2 diabetes mellitus with diabetic chronic kidney disease: Secondary | ICD-10-CM | POA: Insufficient documentation

## 2019-06-13 DIAGNOSIS — Z87891 Personal history of nicotine dependence: Secondary | ICD-10-CM | POA: Diagnosis not present

## 2019-06-13 DIAGNOSIS — N289 Disorder of kidney and ureter, unspecified: Secondary | ICD-10-CM

## 2019-06-13 DIAGNOSIS — E785 Hyperlipidemia, unspecified: Secondary | ICD-10-CM | POA: Diagnosis not present

## 2019-06-13 DIAGNOSIS — R0902 Hypoxemia: Secondary | ICD-10-CM | POA: Diagnosis not present

## 2019-06-13 DIAGNOSIS — J449 Chronic obstructive pulmonary disease, unspecified: Secondary | ICD-10-CM | POA: Diagnosis present

## 2019-06-13 DIAGNOSIS — I361 Nonrheumatic tricuspid (valve) insufficiency: Secondary | ICD-10-CM | POA: Diagnosis not present

## 2019-06-13 DIAGNOSIS — I509 Heart failure, unspecified: Secondary | ICD-10-CM | POA: Diagnosis not present

## 2019-06-13 DIAGNOSIS — I130 Hypertensive heart and chronic kidney disease with heart failure and stage 1 through stage 4 chronic kidney disease, or unspecified chronic kidney disease: Principal | ICD-10-CM | POA: Insufficient documentation

## 2019-06-13 DIAGNOSIS — I110 Hypertensive heart disease with heart failure: Secondary | ICD-10-CM | POA: Diagnosis not present

## 2019-06-13 DIAGNOSIS — N183 Chronic kidney disease, stage 3 unspecified: Secondary | ICD-10-CM | POA: Diagnosis present

## 2019-06-13 DIAGNOSIS — I10 Essential (primary) hypertension: Secondary | ICD-10-CM | POA: Diagnosis present

## 2019-06-13 DIAGNOSIS — Z20828 Contact with and (suspected) exposure to other viral communicable diseases: Secondary | ICD-10-CM | POA: Diagnosis not present

## 2019-06-13 DIAGNOSIS — E1169 Type 2 diabetes mellitus with other specified complication: Secondary | ICD-10-CM | POA: Diagnosis not present

## 2019-06-13 DIAGNOSIS — J9611 Chronic respiratory failure with hypoxia: Secondary | ICD-10-CM | POA: Diagnosis not present

## 2019-06-13 DIAGNOSIS — I129 Hypertensive chronic kidney disease with stage 1 through stage 4 chronic kidney disease, or unspecified chronic kidney disease: Secondary | ICD-10-CM | POA: Diagnosis present

## 2019-06-13 LAB — CBC WITH DIFFERENTIAL/PLATELET
Abs Immature Granulocytes: 0.04 10*3/uL (ref 0.00–0.07)
Basophils Absolute: 0.1 10*3/uL (ref 0.0–0.1)
Basophils Relative: 1 %
Eosinophils Absolute: 0.1 10*3/uL (ref 0.0–0.5)
Eosinophils Relative: 1 %
HCT: 48.1 % (ref 39.0–52.0)
Hemoglobin: 13.6 g/dL (ref 13.0–17.0)
Immature Granulocytes: 1 %
Lymphocytes Relative: 14 %
Lymphs Abs: 1.2 10*3/uL (ref 0.7–4.0)
MCH: 25.5 pg — ABNORMAL LOW (ref 26.0–34.0)
MCHC: 28.3 g/dL — ABNORMAL LOW (ref 30.0–36.0)
MCV: 90.1 fL (ref 80.0–100.0)
Monocytes Absolute: 0.9 10*3/uL (ref 0.1–1.0)
Monocytes Relative: 10 %
Neutro Abs: 6.5 10*3/uL (ref 1.7–7.7)
Neutrophils Relative %: 73 %
Platelets: 225 10*3/uL (ref 150–400)
RBC: 5.34 MIL/uL (ref 4.22–5.81)
RDW: 21.1 % — ABNORMAL HIGH (ref 11.5–15.5)
WBC: 8.8 10*3/uL (ref 4.0–10.5)
nRBC: 0 % (ref 0.0–0.2)

## 2019-06-13 LAB — LIPASE, BLOOD: Lipase: 159 U/L — ABNORMAL HIGH (ref 11–51)

## 2019-06-13 LAB — COMPREHENSIVE METABOLIC PANEL
ALT: 17 U/L (ref 0–44)
AST: 20 U/L (ref 15–41)
Albumin: 3.8 g/dL (ref 3.5–5.0)
Alkaline Phosphatase: 100 U/L (ref 38–126)
Anion gap: 9 (ref 5–15)
BUN: 17 mg/dL (ref 8–23)
CO2: 31 mmol/L (ref 22–32)
Calcium: 8.9 mg/dL (ref 8.9–10.3)
Chloride: 102 mmol/L (ref 98–111)
Creatinine, Ser: 1.51 mg/dL — ABNORMAL HIGH (ref 0.61–1.24)
GFR calc Af Amer: 56 mL/min — ABNORMAL LOW (ref >60)
GFR calc non Af Amer: 48 mL/min — ABNORMAL LOW (ref >60)
Glucose, Bld: 107 mg/dL — ABNORMAL HIGH (ref 70–99)
Potassium: 5.5 mmol/L — ABNORMAL HIGH (ref 3.5–5.1)
Sodium: 142 mmol/L (ref 135–145)
Total Bilirubin: 0.5 mg/dL (ref 0.3–1.2)
Total Protein: 7.6 g/dL (ref 6.5–8.1)

## 2019-06-13 LAB — TROPONIN I (HIGH SENSITIVITY)
Troponin I (High Sensitivity): 10 ng/L (ref <18)
Troponin I (High Sensitivity): 8 ng/L (ref <18)

## 2019-06-13 LAB — BRAIN NATRIURETIC PEPTIDE: B Natriuretic Peptide: 388 pg/mL — ABNORMAL HIGH (ref 0.0–100.0)

## 2019-06-13 LAB — POTASSIUM: Potassium: 5.3 mmol/L — ABNORMAL HIGH (ref 3.5–5.1)

## 2019-06-13 LAB — ECHOCARDIOGRAM COMPLETE
Height: 62 in
Weight: 2010.6 oz

## 2019-06-13 LAB — PROCALCITONIN: Procalcitonin: <0.1 ng/mL

## 2019-06-13 LAB — GLUCOSE, CAPILLARY: Glucose-Capillary: 168 mg/dL — ABNORMAL HIGH (ref 70–99)

## 2019-06-13 LAB — SARS CORONAVIRUS 2 BY RT PCR (HOSPITAL ORDER, PERFORMED IN ~~LOC~~ HOSPITAL LAB): SARS Coronavirus 2: NEGATIVE

## 2019-06-13 LAB — D-DIMER, QUANTITATIVE: D-Dimer, Quant: 0.46 ug/mL-FEU (ref 0.00–0.50)

## 2019-06-13 MED ORDER — LABETALOL HCL 5 MG/ML IV SOLN: 10.0000 mg | INTRAVENOUS | Status: DC | PRN

## 2019-06-13 MED ORDER — ADULT MULTIVITAMIN W/MINERALS CH
1.0000 | ORAL_TABLET | Freq: Every day | ORAL | Status: DC
  Administered 2019-06-13 – 2019-06-14 (×2): 1 via ORAL
  Filled 2019-06-13 (×3): qty 1

## 2019-06-13 MED ORDER — INSULIN ASPART 100 UNIT/ML ~~LOC~~ SOLN: 0.0000 [IU] | Freq: Three times a day (TID) | SUBCUTANEOUS | Status: DC

## 2019-06-13 MED ORDER — FUROSEMIDE 10 MG/ML IJ SOLN
60.0000 mg | Freq: Once | INTRAMUSCULAR | Status: AC
  Administered 2019-06-13: 21:00:00 60 mg via INTRAVENOUS
  Filled 2019-06-13: qty 6

## 2019-06-13 MED ORDER — SODIUM CHLORIDE 0.9 % IV SOLN: 250.0000 mL | INTRAVENOUS | Status: DC | PRN

## 2019-06-13 MED ORDER — FOLIC ACID 1 MG PO TABS
1.0000 mg | ORAL_TABLET | Freq: Every day | ORAL | Status: DC
  Administered 2019-06-13 – 2019-06-14 (×2): 1 mg via ORAL
  Filled 2019-06-13 (×3): qty 1

## 2019-06-13 MED ORDER — TRAZODONE HCL 50 MG PO TABS: 50.0000 mg | ORAL_TABLET | Freq: Every evening | ORAL | Status: DC | PRN

## 2019-06-13 MED ORDER — LINACLOTIDE 72 MCG PO CAPS
72.0000 ug | ORAL_CAPSULE | Freq: Every day | ORAL | Status: DC
  Administered 2019-06-14: 72 ug via ORAL
  Filled 2019-06-13 (×4): qty 1

## 2019-06-13 MED ORDER — ALBUTEROL SULFATE (2.5 MG/3ML) 0.083% IN NEBU: 2.5000 mg | INHALATION_SOLUTION | RESPIRATORY_TRACT | Status: DC | PRN

## 2019-06-13 MED ORDER — ORAL CARE MOUTH RINSE
15.0000 mL | Freq: Two times a day (BID) | OROMUCOSAL | Status: DC
  Administered 2019-06-13 – 2019-06-14 (×2): 15 mL via OROMUCOSAL

## 2019-06-13 MED ORDER — ACETAMINOPHEN 650 MG RE SUPP: 650.0000 mg | Freq: Four times a day (QID) | RECTAL | Status: DC | PRN

## 2019-06-13 MED ORDER — LORAZEPAM 2 MG/ML IJ SOLN: 1.0000 mg | INTRAMUSCULAR | Status: DC | PRN

## 2019-06-13 MED ORDER — ONDANSETRON HCL 4 MG PO TABS: 4.0000 mg | ORAL_TABLET | Freq: Four times a day (QID) | ORAL | Status: DC | PRN

## 2019-06-13 MED ORDER — FUROSEMIDE 10 MG/ML IJ SOLN
40.0000 mg | Freq: Two times a day (BID) | INTRAMUSCULAR | Status: DC
  Administered 2019-06-14: 10:00:00 40 mg via INTRAVENOUS
  Filled 2019-06-13: qty 4

## 2019-06-13 MED ORDER — THIAMINE HCL 100 MG/ML IJ SOLN
100.0000 mg | Freq: Every day | INTRAMUSCULAR | Status: DC
  Administered 2019-06-14: 10:00:00 100 mg via INTRAVENOUS
  Filled 2019-06-13: qty 2

## 2019-06-13 MED ORDER — ACETAMINOPHEN 325 MG PO TABS: 650.0000 mg | ORAL_TABLET | Freq: Four times a day (QID) | ORAL | Status: DC | PRN

## 2019-06-13 MED ORDER — POLYETHYLENE GLYCOL 3350 17 G PO PACK
17.0000 g | PACK | Freq: Two times a day (BID) | ORAL | Status: DC
  Administered 2019-06-13: 21:00:00 17 g via ORAL
  Filled 2019-06-13 (×2): qty 1

## 2019-06-13 MED ORDER — ONDANSETRON HCL 4 MG/2ML IJ SOLN: 4.0000 mg | Freq: Four times a day (QID) | INTRAMUSCULAR | Status: DC | PRN

## 2019-06-13 MED ORDER — TAMSULOSIN HCL 0.4 MG PO CAPS
0.4000 mg | ORAL_CAPSULE | Freq: Every day | ORAL | Status: DC
  Administered 2019-06-13: 21:00:00 0.4 mg via ORAL
  Filled 2019-06-13: qty 1

## 2019-06-13 MED ORDER — PANTOPRAZOLE SODIUM 40 MG PO TBEC
40.0000 mg | DELAYED_RELEASE_TABLET | Freq: Every day | ORAL | Status: DC
  Administered 2019-06-14: 10:00:00 40 mg via ORAL
  Filled 2019-06-13: qty 1

## 2019-06-13 MED ORDER — FUROSEMIDE 10 MG/ML IJ SOLN
40.0000 mg | Freq: Once | INTRAMUSCULAR | Status: AC
  Administered 2019-06-13: 12:00:00 40 mg via INTRAVENOUS
  Filled 2019-06-13: qty 4

## 2019-06-13 MED ORDER — ERGOCALCIFEROL 1.25 MG (50000 UT) PO CAPS: 50000.0000 [IU] | ORAL_CAPSULE | ORAL | Status: DC

## 2019-06-13 MED ORDER — INSULIN ASPART 100 UNIT/ML ~~LOC~~ SOLN: 0.0000 [IU] | Freq: Every day | SUBCUTANEOUS | Status: DC

## 2019-06-13 MED ORDER — LORAZEPAM 1 MG PO TABS: 1.0000 mg | ORAL_TABLET | ORAL | Status: DC | PRN

## 2019-06-13 MED ORDER — VITAMIN B-1 100 MG PO TABS
100.0000 mg | ORAL_TABLET | Freq: Every day | ORAL | Status: DC
  Administered 2019-06-13: 18:00:00 100 mg via ORAL
  Filled 2019-06-13 (×2): qty 1

## 2019-06-13 MED ORDER — GUAIFENESIN ER 600 MG PO TB12
600.0000 mg | ORAL_TABLET | Freq: Two times a day (BID) | ORAL | Status: DC
  Administered 2019-06-13 – 2019-06-14 (×2): 600 mg via ORAL
  Filled 2019-06-13 (×2): qty 1

## 2019-06-13 MED ORDER — AMLODIPINE BESYLATE 5 MG PO TABS
5.0000 mg | ORAL_TABLET | Freq: Every day | ORAL | Status: DC
  Administered 2019-06-13 – 2019-06-14 (×2): 5 mg via ORAL
  Filled 2019-06-13 (×2): qty 1

## 2019-06-13 MED ORDER — DULOXETINE HCL 30 MG PO CPEP
30.0000 mg | ORAL_CAPSULE | Freq: Two times a day (BID) | ORAL | Status: DC
  Administered 2019-06-13: 21:00:00 30 mg via ORAL
  Filled 2019-06-13: qty 1

## 2019-06-13 MED ORDER — SODIUM CHLORIDE 0.9% FLUSH: 3.0000 mL | INTRAVENOUS | Status: DC | PRN

## 2019-06-13 MED ORDER — HEPARIN SODIUM (PORCINE) 5000 UNIT/ML IJ SOLN
5000.0000 [IU] | Freq: Three times a day (TID) | INTRAMUSCULAR | Status: DC
  Administered 2019-06-13: 21:00:00 5000 [IU] via SUBCUTANEOUS
  Filled 2019-06-13 (×2): qty 1

## 2019-06-13 MED ORDER — ALBUTEROL SULFATE HFA 108 (90 BASE) MCG/ACT IN AERS
6.0000 | INHALATION_SPRAY | Freq: Once | RESPIRATORY_TRACT | Status: AC
  Administered 2019-06-13: 10:00:00 6 via RESPIRATORY_TRACT
  Filled 2019-06-13: qty 6.7

## 2019-06-13 MED ORDER — GABAPENTIN 300 MG PO CAPS
300.0000 mg | ORAL_CAPSULE | Freq: Two times a day (BID) | ORAL | Status: DC
  Administered 2019-06-13 – 2019-06-14 (×2): 300 mg via ORAL
  Filled 2019-06-13 (×2): qty 1

## 2019-06-13 MED ORDER — DEXAMETHASONE SODIUM PHOSPHATE 10 MG/ML IJ SOLN
10.0000 mg | Freq: Once | INTRAMUSCULAR | Status: AC
  Administered 2019-06-13: 10:00:00 10 mg via INTRAVENOUS
  Filled 2019-06-13: qty 1

## 2019-06-13 NOTE — ED Notes (Signed)
Pt sleeping at this time.

## 2019-06-13 NOTE — Progress Notes (Signed)
*  PRELIMINARY RESULTS* Echocardiogram 2D Echocardiogram has been performed.  Leavy Cella 06/13/2019, 3:28 PM

## 2019-06-13 NOTE — ED Triage Notes (Addendum)
Pt states he woke up SOB and became dizzy. Tightness in chest. Sats 88% on 2.5L and increased to 3 L. Sharp pain down neck. Negative covid on the 9/26

## 2019-06-13 NOTE — ED Provider Notes (Signed)
[facility] Provider Note   CSN: [id number] Arrival date & time: 06/13/19  3614     History   Chief Complaint Chief Complaint  Patient presents with  . Shortness of Breath    HPI Bryan [name] is a 63 y.o. male with a history of acid reflux, COPD, diabetes, hypertension presenting with increased shortness of breath upon waking this morning around 5 AM.  He also describes pain with deep inspiration midsternal region, described as sharp.  He is on chronic home O2 PRN 2.5 L which he states was not effective this morning.  However, he also states his nose has been congested and he has been mouth breathing.  He denies fevers or chills and has not been coughing.  He was seen here 4 days ago for COPD exacerbation and was also diagnosed with a suspected pancreatitis.  He was discharged home and maintained fluid intake only x2 days, he has  improvement in his left upper quadrant pain although still present.  He denies nausea, vomiting, abdominal distention.  He denies peripheral edema, leg pain, orthopnea.  He was screened for COVID 4 days ago while here, test was negative.     The history is provided by the patient.    Past Medical History:  Diagnosis Date  . Acid reflux   . Arthritis   . Asthma   . Chronic pain    with leg and back pain (disc problem)  . COPD (chronic obstructive pulmonary disease) ([place])   . Diabetes mellitus without complication ([place])   . Headache    HX OF  . History of upper GI x-ray series    to follow showed large duodenal ulcer H pylori serologies were negative  . Hypertension   . Pneumonia    07/17/17  . Pre-diabetes   . Stroke [facility])    TIA MINI STROKE  . Tubular adenoma     Patient Active Problem List   Diagnosis Date Noted  . Normocytic anemia 02/15/2019  . COPD exacerbation ([place]) 09/16/2018  . Hypokalemia 06/26/2018  . Hypomagnesemia 06/26/2018  . Anemia 06/26/2018  . BPH (benign prostatic hyperplasia) 06/26/2018  . Chronic  respiratory failure with hypoxia ([place]) 06/04/2018  . HCAP (healthcare-associated pneumonia) 06/04/2018  . Acute respiratory failure with hypoxia ([place]) 04/14/2018  . Pulmonary nodule 04/14/2018  . CKD (chronic kidney disease), stage III 04/14/2018  . Type 2 diabetes mellitus with hyperlipidemia ([place]) 03/12/2018  . BPH with obstruction/lower urinary tract symptoms 08/17/2017  . Left sided numbness 07/22/2017  . TIA (transient ischemic attack) 07/22/2017  . Pneumonia 07/14/2017  . COPD with acute exacerbation ([place]) 07/14/2017  . Constipation 07/10/2017  . Hx of adenomatous colonic polyps 05/01/2017  . Hypertension   . Chronic pain   . Heme positive stool 10/20/2014  . Epigastric pain 06/19/2014  . Dysphagia 06/19/2014  . AP (abdominal pain) 11/13/2013  . Early satiety 11/13/2013  . COPD, severe ([place]) 02/22/2013  . Low back pain 11/19/2012  . COLITIS 05/12/2009  . ABDOMINAL PAIN, LEFT LOWER QUADRANT 05/12/2009  . DUODENAL ULCER, HX OF 05/12/2009  . ALCOHOL USE 05/11/2009  . GERD 05/11/2009  . Diarrhea 05/11/2009  . SPONDYLOSIS, CERVICAL 07/03/2007  . NECK PAIN 07/03/2007    Past Surgical History:  Procedure Laterality Date  . BACK SURGERY  7/05; 5/09    Dr.Hirsch,3 lumbar  X3  . BACK SURGERY    . COLONOSCOPY  Feb 2012   Dr. Gala Romney: normal rectum, pedunculate polyp removed but not recovered  .  COLONOSCOPY WITH ESOPHAGOGASTRODUODENOSCOPY (EGD) N/A 11/18/2013   Dr.Rourk- tcs= normal rectum, multipe polyps about the ileocecal valve and distal transverse colon o/w the remainder of the colonic mucosa appeared normal bx= tubular adenoma. EGD= normal esophagus, stomach with scattered erosions mottling, friablility, no ulcer or infiltrating process patent pylorus bx= chronic inflammation. next TCS 11/2016  . COLONOSCOPY WITH PROPOFOL N/A 05/25/2017   Dr. Gala Romney: sigmoid diverticulosis, one 4 mm hyerplastic rectal polyp, ascending colonic AVMs surveillance 2023  . ESOPHAGOGASTRODUODENOSCOPY   1/06   Dr. Volney American esophageal erosions,U-shaped stomach,marked erosions and edema of the bulb without discrete ulcer disease.   . ESOPHAGOGASTRODUODENOSCOPY (EGD) WITH PROPOFOL N/A 05/25/2017   Dr. Gala Romney: reflux esophagitis s/p empiric dilation, normal stomach and duodenum  . MALONEY DILATION N/A 05/25/2017   Procedure: Venia Minks DILATION;  Surgeon: Daneil Dolin, MD;  Location: [facility] ENDO SUITE;  Service: Endoscopy;  Laterality: N/A;  . NECK SURGERY  10/2007   PLATE IN NECK  . POLYPECTOMY  05/25/2017   Procedure: POLYPECTOMY;  Surgeon: Daneil Dolin, MD;  Location: [facility] END[facility] SUITE;  Service: Endoscopy;;  rectal  . TRANSURETHRAL RESECTION OF PROSTATE N/A 08/17/2017   Procedure: TRANSURETHRAL RESECTION OF THE PROSTATE (TURP);  Surgeon: Irine Seal, MD;  Location: [facility] ORS;  Service: Urology;  Laterality: N/A;        Home Medications    Prior to Admission medications   Medication Sig Start Date End Date Taking? Authorizing Provider  albuterol (PROVENTIL HFA;VENTOLIN HFA) 108 (90 Base) MCG/ACT inhaler Inhale 1-2 puffs every 4 (four) hours as needed into the lungs for wheezing or shortness of breath. Do not use with nebulizer Patient taking differently: Inhale 1 puff into the lungs every 4 (four) hours as needed for wheezing or shortness of breath. Do not use with nebulizer 07/17/17  Yes [name], MD  albuterol (PROVENTIL) (2.5 MG/3ML) 0.083% nebulizer solution Take 3 mLs (2.5 mg total) by nebulization every 4 (four) hours as needed for wheezing or shortness of breath. 06/29/18  Yes [name], MD  amLODipine (NORVASC) 5 MG tablet Take 1 tablet (5 mg total) by mouth daily. 03/13/18 06/13/19 Yes [name], MD  DULoxetine (CYMBALTA) 30 MG capsule Take 1 capsule by mouth 2 (two) times daily. 04/22/19  Yes [provider]  ergocalciferol (VITAMIN D2) 1.25 MG (50000 UT) capsule Take 50,000 Units by mouth once a week. 04/29/19  Yes Phillips Odor, MD  gabapentin (NEURONTIN)  300 MG capsule Take 1 capsule (300 mg total) by mouth 2 (two) times daily. Pt. Says he is taking twice daily. Patient taking differently: Take 300 mg by mouth 2 (two) times daily.  06/05/18  Yes Barton Dubois, MD  guaiFENesin (MUCINEX) 600 MG 12 hr tablet Take 1 tablet (600 mg total) by mouth 2 (two) times daily. 06/29/18 06/29/19 Yes [name], MD  OXYGEN Inhale 2.5 L into the lungs daily.   Yes [provider]  pantoprazole (PROTONIX) 40 MG tablet Take 1 tablet (40 mg total) by mouth daily before breakfast. 02/15/19  Yes [name], NP  polyethylene glycol powder (MIRALAX) 17 GM/SCOOP powder Take 17 g by mouth daily as needed (FOR CONSTIPATION).  11/14/18  Yes [provider]  tamsulosin (FLOMAX) 0.4 MG CAPS capsule Take 1 capsule (0.4 mg total) by mouth daily after supper. 09/18/18  Yes Rai, Ripudeep K, MD  traMADol (ULTRAM) 50 MG tablet Take 1 tablet (50 mg total) by mouth every 6 (six) hours as needed. 06/09/19  Yes Milton Ferguson, MD  linaclotide (LINZESS) 72 MCG capsule Take 1 capsule (72 mcg total) by mouth daily before breakfast. Patient not taking: Reported on 06/13/2019 05/08/18   [name], NP  ondansetron (ZOFRAN ODT) 4 MG disintegrating tablet 76m ODT q4 hours prn nausea/vomit Patient not taking: Reported on 06/13/2019 06/09/19   ZMilton Ferguson MD    Family History Family History  Problem Relation Age of Onset  . Cancer Father   . Asthma Mother   . Colon cancer Neg Hx     Social History Social History   Tobacco Use  . Smoking status: Former Smoker    Packs/day: 0.50    Years: 40.00    Pack years: 20.00    Types: Cigarettes    Quit date: 11/11/2014    Years since quitting: 4.5  . Smokeless tobacco: Never Used  Substance Use Topics  . Alcohol use: No    Alcohol/week: 0.0 standard drinks  . Drug use: Yes    Types: Marijuana    Comment: 02/15/19-"hit one 3 days ago"     Allergies   Patient has no known allergies.   Review of Systems Review of  Systems  Constitutional: Negative for chills and fever.  HENT: Positive for congestion. Negative for sore throat.   Eyes: Negative.   Respiratory: Positive for chest tightness and shortness of breath. Negative for wheezing.   Cardiovascular: Positive for chest pain. Negative for palpitations and leg swelling.  Gastrointestinal: Positive for abdominal pain. Negative for abdominal distention, nausea and vomiting.  Genitourinary: Negative.   Musculoskeletal: Negative for arthralgias, joint swelling and neck pain.  Skin: Negative.  Negative for rash and wound.  Neurological: Negative for dizziness, weakness, light-headedness, numbness and headaches.  Psychiatric/Behavioral: Negative.      Physical Exam Updated Vital Signs BP 135/86   Pulse (!) 110   Temp 99 F (37.2 C) (Oral)   Resp (!) 29   Ht _0  (1.575 m)   Wt 57 kg   SpO2 92%   BMI 22.98 kg/m   Physical Exam Vitals signs and nursing note reviewed.  Constitutional:      Appearance: He is well-developed.  HENT:     Head: Normocephalic and atraumatic.  Eyes:     Conjunctiva/sclera: Conjunctivae normal.  Neck:     Musculoskeletal: Normal range of motion.  Cardiovascular:     Rate and Rhythm: Normal rate and regular rhythm.     Heart sounds: Normal heart sounds.  Pulmonary:     Effort: Pulmonary effort is normal.     Breath sounds: Normal breath sounds. No wheezing.  Abdominal:     General: Bowel sounds are normal.     Palpations: Abdomen is soft.     Tenderness: There is no abdominal tenderness.  Musculoskeletal: Normal range of motion.  Skin:    General: Skin is warm and dry.  Neurological:     Mental Status: He is alert.      ED Treatments / Results  Labs (all labs ordered are listed, but only abnormal results are displayed) Labs Reviewed  CBC WITH DIFFERENTIAL/PLATELET - Abnormal; Notable for the following components:      Result Value   MCH 25.5 (*)    MCHC 28.3 (*)    RDW 21.1 (*)    All other  components within normal limits  COMPREHENSIVE METABOLIC PANEL - Abnormal; Notable for the following components:   Potassium 5.5 (*)    Glucose, Bld 107 (*)    Creatinine, Ser 1.51 (*)  GFR calc non Af Amer 48 (*)    GFR calc Af Amer 56 (*)    All other components within normal limits  LIPASE, BLOOD - Abnormal; Notable for the following components:   Lipase 159 (*)    All other components within normal limits  BRAIN NATRIURETIC PEPTIDE - Abnormal; Notable for the following components:   B Natriuretic Peptide 388.0 (*)    All other components within normal limits  SARS CORONAVIRUS 2 (HOSPITAL ORDER, [place] [facility])  D-DIMER, QUANTITATIVE (NOT AT [facility])  POTASSIUM  TROPONIN I (HIGH SENSITIVITY)  TROPONIN I (HIGH SENSITIVITY)    EKG None  Radiology Dg Chest Port 1 View  Result Date: 06/13/2019 CLINICAL DATA:  Shortness of breath, dizziness EXAM: PORTABLE CHEST 1 VIEW COMPARISON:  01/29/2019 FINDINGS: The heart size and mediastinal contours are within normal limits. Mild, diffuse interstitial pulmonary opacity. The visualized skeletal structures are unremarkable. IMPRESSION: Mild, diffuse interstitial pulmonary opacity, suggesting edema or atypical infection. No focal airspace opacity. Electronically Signed   By: Eddie Candle M.D.   On: 06/13/2019 10:15    Procedures Procedures (including critical care time)   Medications Ordered in ED Medications  albuterol (VENTOLIN HFA) 108 (90 Base) MCG/ACT inhaler 6 puff (6 puffs Inhalation Given 06/13/19 1024)  dexamethasone (DECADRON) injection 10 mg (10 mg Intravenous Given 06/13/19 1023)  furosemide (LASIX) injection 40 mg (40 mg Intravenous Given 06/13/19 1214)     Initial Impression / Assessment and Plan / ED Course  I have reviewed the triage vital signs and the nursing notes.  Pertinent labs & imaging results that were available during my care of the patient were reviewed by me and considered in my medical  decision making (see chart for details).  Clinical Course as of Jun 12 1354  Thu Jun 12, 3968  7743 62 year old male here with shortness of breath history of COPD.  Recent Covid testing negative.  He is received some steroids and breathing treatments here along with some diuresis and feeling improved.  Still remains tachycardic but is eating lunch and does not look in any distress.  Probable admission but will see if he is improving.   [MB]    Clinical Course User Index [MB] Hayden Rasmussen, MD       [name] was evaluated in Emergency Department on 06/13/2019 for the symptoms described in the history of present illness. He was evaluated in the context of the global COVID-19 pandemic, which necessitated consideration that the patient might be at risk for infection with the SARS-CoV-2 virus that causes COVID-19. Institutional protocols and algorithms that pertain to the evaluation of patients at risk for COVID-19 are in a state of rapid change based on information released by regulatory bodies including the CDC and federal and state organizations. These policies and algorithms were followed during the patient's care in the ED.   Pt with new onset CHF in association with COPD chronically, no home oxygen prn use 2.5L.  No wheezing on exam today, but generalized reduced aeration throughout all lung fields.  CXR and BNP confirms CHF which is a new diagnosis.  He was given albuterol mdi prior to Covid testing negative with equivocal improvement in sob.  Lasix 40 IV given.  VS with tachypnea at rest, persistent tachycardia 105-11- range.  Delta troponins negative.  Normal range d dimer, doubt PE.  Pt to be admitted for tx of new onset CHF.  Pt is Covid negative.   Pt also has  h/o renal insufficiency, K+ elevated at 5.5.  Will repeat to confirm, creatinine stable at 1.51.   Discussed with Dr. Denton Brick who will see and admit pt.   Final Clinical Impressions(s) / ED Diagnoses   Final diagnoses:   Acute congestive heart failure, unspecified heart failure type [facility])  Renal insufficiency  Hypoxia    ED Discharge Orders    None       Landis Martins 06/13/19 1420    Hayden Rasmussen, MD 06/13/19 [phone]

## 2019-06-13 NOTE — H&P (Signed)
[name], is a 63 y.o. male  MRN: [id number]   DOB - 1956/04/16  Admit Date - 06/13/2019  Outpatient Primary MD for the patient is Rosita Fire, MD   Assessment & Plan:    Active Problems:   Acute exacerbation of CHF (congestive heart failure) (HCC)   1)HFpEF--acute dCHF exacerbation, echo with EF of over 60%, treat empirically with IV Lasix, daily weights, fluid output and input monitoring,  -Check troponins to rule out ACS -Clinical exam, chest x-ray and BNP suggest CHF exacerbation  2)COPD -no acute exacerbation at this time, continue bronchodilators and mucolytics continue supplemental oxygen -Procalcitonin is less than 0.10  3) acute on chronic hypoxic respiratory failure--secondary to COPD and CHF as above #1 and 2, at baseline patient uses 2.5 L of oxygen at home, currently requiring up to 3.5 L due to CHF exacerbation  4) chronic abdominal pain/elevated lipase/chronic constipation --- patient has had extensive GI work-up in the past, -Concerns about possible pancreatitis Continue Protonix 40 mg p.o. daily.    5)Hypertension- Continue amlodipine 5 mg p.o. daily,   may use IV labetalol when necessary  Every 4 hours for systolic blood pressure over 160 mmhg   6)Type 2 diabetes mellitus with hyperlipidemia (HCC) Use Novolog/Humalog Sliding scale insulin with Accu-Cheks/Fingersticks as ordered   7)CKD (chronic kidney disease), stage III (HCC) --Renal function appears to be at baseline at this time -renally adjust medications, avoid nephrotoxic agents/dehydration/hypotension -Watch renal function closely with diuresis  8)  BPH (benign prostatic hyperplasia) -Stable continue Flomax.   With History of - Reviewed by me  Past Medical History:  Diagnosis Date  . Acid  reflux   . Arthritis   . Asthma   . Chronic pain    with leg and back pain (disc problem)  . COPD (chronic obstructive pulmonary disease) ([place])   . Diabetes mellitus without complication ([place])   . Headache    HX OF  . History of upper GI x-ray series    to follow showed large duodenal ulcer H pylori serologies were negative  . Hypertension   . Pneumonia    07/17/17  . Pre-diabetes   . Stroke [facility])    TIA MINI STROKE  . Tubular adenoma       Past Surgical History:  Procedure Laterality Date  . BACK SURGERY  7/05; 5/09    Dr.Hirsch,3 lumbar  X3  . BACK SURGERY    . COLONOSCOPY  Feb 2012   Dr. Gala Romney: normal rectum, pedunculate polyp removed but not recovered  . COLONOSCOPY WITH ESOPHAGOGASTRODUODENOSCOPY (EGD) N/A 11/18/2013   Dr.Rourk- tcs= normal rectum, multipe polyps about the ileocecal valve and distal transverse colon o/w the remainder of the colonic mucosa appeared normal bx= tubular adenoma. EGD= normal esophagus, stomach with scattered erosions mottling, friablility, no ulcer or infiltrating process patent pylorus bx= chronic inflammation. next TCS 11/2016  . COLONOSCOPY WITH PROPOFOL N/A 05/25/2017  Dr. Gala Romney: sigmoid diverticulosis, one 4 mm hyerplastic rectal polyp, ascending colonic AVMs surveillance 2023  . ESOPHAGOGASTRODUODENOSCOPY  1/06   Dr. Volney American esophageal erosions,U-shaped stomach,marked erosions and edema of the bulb without discrete ulcer disease.   . ESOPHAGOGASTRODUODENOSCOPY (EGD) WITH PROPOFOL N/A 05/25/2017   Dr. Gala Romney: reflux esophagitis s/p empiric dilation, normal stomach and duodenum  . MALONEY DILATION N/A 05/25/2017   Procedure: Venia Minks DILATION;  Surgeon: Daneil Dolin, MD;  Location: [facility] SUITE;  Service: Endoscopy;  Laterality: N/A;  . NECK SURGERY  10/2007   PLATE IN NECK  . POLYPECTOMY  05/25/2017   Procedure: POLYPECTOMY;  Surgeon: Daneil Dolin, MD;  Location: [facility] SUITE;  Service: Endoscopy;;  rectal  . TRANSURETHRAL RESECTION  OF PROSTATE N/A 08/17/2017   Procedure: TRANSURETHRAL RESECTION OF THE PROSTATE (TURP);  Surgeon: Irine Seal, MD;  Location: [facility] ORS;  Service: Urology;  Laterality: N/A;      Chief Complaint  Patient presents with  . Shortness of Breath      HPI:    Bryan [name]  is a 63 y.o. male with medical history significant of acid reflux, arthritis, asthma, chronic back pain, COPD, type 2 diabetes, hypertension, history of pneumonia, history of TIA, tubular adenoma presents to the ED for the second time in 4 days with shortness of breath, dizziness and some chest discomfort, O2 sats was 88% on 1/2 L  -COVID test negative on 06/08/2019 repeat COVID test also negative to date 06/13/2019  -Patient was seen in the ED for similar symptoms on 06/09/2019 -He is on home O2 at 2.5 L  --Presents today with increasing shortness of breath at rest, dyspnea on exertion, paroxysmal nocturnal dyspnea and orthopnea,  -Chest x-ray suggestive of pulmonary edema, cannot rule out atypical infection -However procalcitonin is less than 0.10 -Troponin is 8, d-dimer is not elevated at 0.46, lipase is 159 -WBC 8.8 -BNP 388 which is higher than patient's previous baseline -Creatinine is 1.5 which is around patient's baseline -Potassium is noted to be 5.5  -Patient admits to eating bacon pretty much daily, eating food high in salt on a daily basis   Review of systems:    In addition to the HPI above,   A full Review of  Systems was done, all other systems reviewed are negative except as noted above in HPI , .    Social History:  Reviewed by me    Social History   Tobacco Use  . Smoking status: Former Smoker    Packs/day: 0.50    Years: 40.00    Pack years: 20.00    Types: Cigarettes    Quit date: 11/11/2014    Years since quitting: 4.5  . Smokeless tobacco: Never Used  Substance Use Topics  . Alcohol use: No    Alcohol/week: 0.0 standard drinks       Family History :  Reviewed by me    Family  History  Problem Relation Age of Onset  . Cancer Father   . Asthma Mother   . Colon cancer Neg Hx      Home Medications:   Prior to Admission medications   Medication Sig Start Date End Date Taking? Authorizing Provider  albuterol (PROVENTIL HFA;VENTOLIN HFA) 108 (90 Base) MCG/ACT inhaler Inhale 1-2 puffs every 4 (four) hours as needed into the lungs for wheezing or shortness of breath. Do not use with nebulizer Patient taking differently: Inhale 1 puff into the lungs every 4 (four) hours as needed for wheezing or  shortness of breath. Do not use with nebulizer 07/17/17  Yes [name], MD  albuterol (PROVENTIL) (2.5 MG/3ML) 0.083% nebulizer solution Take 3 mLs (2.5 mg total) by nebulization every 4 (four) hours as needed for wheezing or shortness of breath. 06/29/18  Yes [name], MD  amLODipine (NORVASC) 5 MG tablet Take 1 tablet (5 mg total) by mouth daily. 03/13/18 06/13/19 Yes [name], MD  DULoxetine (CYMBALTA) 30 MG capsule Take 1 capsule by mouth 2 (two) times daily. 04/22/19  Yes [provider]  ergocalciferol (VITAMIN D2) 1.25 MG (50000 UT) capsule Take 50,000 Units by mouth once a week. 04/29/19  Yes Phillips Odor, MD  gabapentin (NEURONTIN) 300 MG capsule Take 1 capsule (300 mg total) by mouth 2 (two) times daily. Pt. Says he is taking twice daily. Patient taking differently: Take 300 mg by mouth 2 (two) times daily.  06/05/18  Yes Barton Dubois, MD  guaiFENesin (MUCINEX) 600 MG 12 hr tablet Take 1 tablet (600 mg total) by mouth 2 (two) times daily. 06/29/18 06/29/19 Yes [name], MD  OXYGEN Inhale 2.5 L into the lungs daily.   Yes [provider]  pantoprazole (PROTONIX) 40 MG tablet Take 1 tablet (40 mg total) by mouth daily before breakfast. 02/15/19  Yes [name], NP  polyethylene glycol powder (MIRALAX) 17 GM/SCOOP powder Take 17 g by mouth daily as needed (FOR CONSTIPATION).  11/14/18  Yes [provider]  tamsulosin  (FLOMAX) 0.4 MG CAPS capsule Take 1 capsule (0.4 mg total) by mouth daily after supper. 09/18/18  Yes Rai, Ripudeep K, MD  traMADol (ULTRAM) 50 MG tablet Take 1 tablet (50 mg total) by mouth every 6 (six) hours as needed. 06/09/19  Yes Milton Ferguson, MD  linaclotide Rolan Lipa) 72 MCG capsule Take 1 capsule (72 mcg total) by mouth daily before breakfast. Patient not taking: Reported on 06/13/2019 05/08/18   [name], NP  ondansetron (ZOFRAN ODT) 4 MG disintegrating tablet 51m ODT q4 hours prn nausea/vomit Patient not taking: Reported on 06/13/2019 06/09/19   ZMilton Ferguson MD     Allergies:    No Known Allergies   Physical Exam:   Vitals  Blood pressure 136/74, pulse (!) 107, temperature 98.7 F (37.1 C), temperature source Oral, resp. rate (!) 24, height _0  (1.575 m), weight 57 kg, SpO2 94 %.  Physical Examination: General appearance - alert, well appearing, and in no distress  Mental status - alert, oriented to person, place, and time,  Eyes - sclera anicteric Nose- [place] 3.5L/min Neck - supple, no JVD elevation , Chest -diminished in bases with bibasilar rales, Heart - S1 and S2 normal, regular  Abdomen - soft, nontender, nondistended, no masses or organomegaly Neurological - screening mental status exam normal, neck supple without rigidity, cranial nerves II through XII intact, DTR's normal and symmetric Extremities - no pedal edema noted, intact peripheral pulses  Skin - warm, dry     Data Review:    CBC Recent Labs  Lab 06/09/19 1758 06/13/19 1015  WBC 7.7 8.8  HGB 13.1 13.6  HCT 44.8 48.1  PLT 206 225  MCV 86.5 90.1  MCH 25.3* 25.5*  MCHC 29.2* 28.3*  RDW 20.9* 21.1*  LYMPHSABS 1.7 1.2  MONOABS 1.0 0.9  EOSABS 0.2 0.1  BASOSABS 0.1 0.1   ------------------------------------------------------------------------------------------------------------------  Chemistries  Recent Labs  Lab 06/09/19 1758 06/13/19 1015 06/13/19 1420  NA 140 142  --   K 4.1 5.5*  5.3*  CL 101  102  --   CO2 30 31  --   GLUCOSE 113* 107*  --   BUN 16 17  --   CREATININE 1.66* 1.51*  --   CALCIUM 8.8* 8.9  --   AST 18 20  --   ALT 14 17  --   ALKPHOS 96 100  --   BILITOT 0.6 0.5  --    ------------------------------------------------------------------------------------------------------------------ estimated creatinine clearance is 38.7 mL/min (A) (by C-G formula based on SCr of 1.51 mg/dL (H)). ------------------------------------------------------------------------------------------------------------------ No results for input(s): TSH, T4TOTAL, T3FREE, THYROIDAB in the last 72 hours.  Invalid input(s): FREET3   Coagulation profile No results for input(s): INR, PROTIME in the last 168 hours. ------------------------------------------------------------------------------------------------------------------- Recent Labs    06/13/19 1015  DDIMER 0.46   -------------------------------------------------------------------------------------------------------------------  Cardiac Enzymes No results for input(s): CKMB, TROPONINI, MYOGLOBIN in the last 168 hours.  Invalid input(s): CK ------------------------------------------------------------------------------------------------------------------    Component Value Date/Time   BNP 388.0 (H) 06/13/2019 1044     ---------------------------------------------------------------------------------------------------------------  Urinalysis    Component Value Date/Time   COLORURINE STRAW (A) 01/29/2019 1626   APPEARANCEUR CLEAR 01/29/2019 1626   LABSPEC 1.008 01/29/2019 1626   PHURINE 6.0 01/29/2019 1626   GLUCOSEU NEGATIVE 01/29/2019 1626   HGBUR NEGATIVE 01/29/2019 1626   BILIRUBINUR NEGATIVE 01/29/2019 1626   KETONESUR NEGATIVE 01/29/2019 1626   PROTEINUR 30 (A) 01/29/2019 1626   UROBILINOGEN 0.2 07/08/2015 1314   NITRITE NEGATIVE 01/29/2019 1626   LEUKOCYTESUR NEGATIVE 01/29/2019 1626     ----------------------------------------------------------------------------------------------------------------   Imaging Results:    Dg Chest Port 1 View  Result Date: 06/13/2019 CLINICAL DATA:  Shortness of breath, dizziness EXAM: PORTABLE CHEST 1 VIEW COMPARISON:  01/29/2019 FINDINGS: The heart size and mediastinal contours are within normal limits. Mild, diffuse interstitial pulmonary opacity. The visualized skeletal structures are unremarkable. IMPRESSION: Mild, diffuse interstitial pulmonary opacity, suggesting edema or atypical infection. No focal airspace opacity. Electronically Signed   By: Eddie Candle M.D.   On: 06/13/2019 10:15    Radiological Exams on Admission: Dg Chest Port 1 View  Result Date: 06/13/2019 CLINICAL DATA:  Shortness of breath, dizziness EXAM: PORTABLE CHEST 1 VIEW COMPARISON:  01/29/2019 FINDINGS: The heart size and mediastinal contours are within normal limits. Mild, diffuse interstitial pulmonary opacity. The visualized skeletal structures are unremarkable. IMPRESSION: Mild, diffuse interstitial pulmonary opacity, suggesting edema or atypical infection. No focal airspace opacity. Electronically Signed   By: Eddie Candle M.D.   On: 06/13/2019 10:15    DVT Prophylaxis -SCD  /heparin AM Labs Ordered, also please review Full Orders  Family Communication: Admission, patients condition and plan of care including tests being ordered have been discussed with the patient  who indicate understanding and agree with the plan   Code Status - Full Code  Likely DC to  home  Condition   stable  Roxan Hockey M.D on 06/13/2019 at 7:28 PM Go to [email or web address] -  for contact info  [facility] - Office  [phone]

## 2019-06-14 ENCOUNTER — Ambulatory Visit: Payer: Medicare Other | Admitting: Urology

## 2019-06-14 DIAGNOSIS — I5033 Acute on chronic diastolic (congestive) heart failure: Secondary | ICD-10-CM | POA: Diagnosis not present

## 2019-06-14 LAB — CBC
HCT: 46.1 % (ref 39.0–52.0)
Hemoglobin: 13.4 g/dL (ref 13.0–17.0)
MCH: 25.5 pg — ABNORMAL LOW (ref 26.0–34.0)
MCHC: 29.1 g/dL — ABNORMAL LOW (ref 30.0–36.0)
MCV: 87.8 fL (ref 80.0–100.0)
Platelets: 235 10*3/uL (ref 150–400)
RBC: 5.25 MIL/uL (ref 4.22–5.81)
RDW: 20.5 % — ABNORMAL HIGH (ref 11.5–15.5)
WBC: 7.6 10*3/uL (ref 4.0–10.5)
nRBC: 0 % (ref 0.0–0.2)

## 2019-06-14 LAB — GLUCOSE, CAPILLARY
Glucose-Capillary: 119 mg/dL — ABNORMAL HIGH (ref 70–99)
Glucose-Capillary: 136 mg/dL — ABNORMAL HIGH (ref 70–99)

## 2019-06-14 LAB — BASIC METABOLIC PANEL
Anion gap: 10 (ref 5–15)
BUN: 23 mg/dL (ref 8–23)
CO2: 36 mmol/L — ABNORMAL HIGH (ref 22–32)
Calcium: 9.1 mg/dL (ref 8.9–10.3)
Chloride: 97 mmol/L — ABNORMAL LOW (ref 98–111)
Creatinine, Ser: 1.56 mg/dL — ABNORMAL HIGH (ref 0.61–1.24)
GFR calc Af Amer: 54 mL/min — ABNORMAL LOW (ref >60)
GFR calc non Af Amer: 47 mL/min — ABNORMAL LOW (ref >60)
Glucose, Bld: 101 mg/dL — ABNORMAL HIGH (ref 70–99)
Potassium: 4.4 mmol/L (ref 3.5–5.1)
Sodium: 143 mmol/L (ref 135–145)

## 2019-06-14 LAB — HEMOGLOBIN A1C
Hgb A1c MFr Bld: 7 % — ABNORMAL HIGH (ref 4.8–5.6)
Mean Plasma Glucose: 154.2 mg/dL

## 2019-06-14 MED ORDER — ACETAMINOPHEN 325 MG PO TABS: 650.0000 mg | ORAL_TABLET | Freq: Four times a day (QID) | ORAL | 0 refills | Status: DC | PRN

## 2019-06-14 MED ORDER — ALBUTEROL SULFATE (2.5 MG/3ML) 0.083% IN NEBU: 2.5000 mg | INHALATION_SOLUTION | RESPIRATORY_TRACT | 5 refills | Status: DC | PRN

## 2019-06-14 MED ORDER — ALBUTEROL SULFATE HFA 108 (90 BASE) MCG/ACT IN AERS: 2.0000 | INHALATION_SPRAY | RESPIRATORY_TRACT | 2 refills | Status: DC | PRN

## 2019-06-14 MED ORDER — TORSEMIDE 20 MG PO TABS: 20.0000 mg | ORAL_TABLET | Freq: Every day | ORAL | 5 refills | Status: DC

## 2019-06-14 MED ORDER — TAMSULOSIN HCL 0.4 MG PO CAPS: 0.4000 mg | ORAL_CAPSULE | Freq: Every day | ORAL | 5 refills | Status: DC

## 2019-06-14 MED ORDER — ONDANSETRON 4 MG PO TBDP: ORAL_TABLET | ORAL | 0 refills | Status: DC

## 2019-06-14 MED ORDER — LINACLOTIDE 72 MCG PO CAPS: 72.0000 ug | ORAL_CAPSULE | Freq: Every day | ORAL | 3 refills | Status: AC

## 2019-06-14 MED ORDER — POTASSIUM CHLORIDE ER 10 MEQ PO TBCR: 10.0000 meq | EXTENDED_RELEASE_TABLET | ORAL | 0 refills | Status: DC

## 2019-06-14 MED ORDER — GABAPENTIN 300 MG PO CAPS: 300.0000 mg | ORAL_CAPSULE | Freq: Two times a day (BID) | ORAL | Status: AC

## 2019-06-14 MED ORDER — TRELEGY ELLIPTA 100-62.5-25 MCG/INH IN AEPB: 1.0000 | INHALATION_SPRAY | Freq: Every day | RESPIRATORY_TRACT | 5 refills | Status: AC

## 2019-06-14 MED ORDER — GUAIFENESIN ER 600 MG PO TB12: 600.0000 mg | ORAL_TABLET | Freq: Two times a day (BID) | ORAL | 2 refills | Status: DC

## 2019-06-14 MED ORDER — AMLODIPINE BESYLATE 2.5 MG PO TABS: 2.5000 mg | ORAL_TABLET | Freq: Every day | ORAL | 11 refills | Status: DC

## 2019-06-14 NOTE — Discharge Summary (Signed)
Bryan [name], is a 63 y.o. male  DOB [birth year]  MRN [id number].  Admission date:  06/13/2019  Admitting Physician  Roxan Hockey, MD  Discharge Date:  06/14/2019   Primary MD  Rosita Fire, MD  Recommendations for primary care physician for things to follow:   - 1)Very low-salt diet advised 2)Weigh yourself daily, call if you gain more than 3 pounds in 1 day or more than 5 pounds in 1 week as your diuretic medications may need to be adjusted 3)Limit your Fluid  intake to no more than 60 ounces (1.8 Liters) per day 4)Avoid Bacon, ham, Hot dogs, sausages as they are very high in salt 5)Reduce your amlodipine/Norvasc to 2.5 mg daily from 5 mg daily 6)Take Torsemide/Demadex fluid pills 20 mg every morning as prescribed 7)Take Flomax/tamsulosin medication for your prostate as prescribed 8) follow-up with the primary care physician in 1 to 2 weeks for recheck and reevaluation 9) continue to use oxygen via nasal cannula at 2.5 to 3 L/min continuously as prescribed  Admission Diagnosis  Renal insufficiency [N28.9] Hypoxia [R09.02] Acute congestive heart failure, unspecified heart failure type ([place]) [I50.9]   Discharge Diagnosis  Renal insufficiency [N28.9] Hypoxia [R09.02] Acute congestive heart failure, unspecified heart failure type ([place]) [I50.9]    Principal Problem:   Acute exacerbation of CHF (congestive heart failure) /HFpEF (EF 60%) Active Problems:   COPD, severe (HCC)   Hypertension   BPH with obstruction/lower urinary tract symptoms   Type 2 diabetes mellitus with hyperlipidemia (HCC)   CKD (chronic kidney disease), stage III   Chronic respiratory failure with hypoxia ([place])      Past Medical History:  Diagnosis Date  . Acid reflux   . Arthritis   . Asthma   . Chronic pain    with leg and back pain (disc problem)  . COPD (chronic obstructive pulmonary disease) ([place])   . Diabetes  mellitus without complication ([place])   . Headache    HX OF  . History of upper GI x-ray series    to follow showed large duodenal ulcer H pylori serologies were negative  . Hypertension   . Pneumonia    07/17/17  . Pre-diabetes   . Stroke [facility])    TIA MINI STROKE  . Tubular adenoma     Past Surgical History:  Procedure Laterality Date  . BACK SURGERY  7/05; 5/09    Dr.Hirsch,3 lumbar  X3  . BACK SURGERY    . COLONOSCOPY  Feb 2012   Dr. Gala Romney: normal rectum, pedunculate polyp removed but not recovered  . COLONOSCOPY WITH ESOPHAGOGASTRODUODENOSCOPY (EGD) N/A 11/18/2013   Dr.Rourk- tcs= normal rectum, multipe polyps about the ileocecal valve and distal transverse colon o/w the remainder of the colonic mucosa appeared normal bx= tubular adenoma. EGD= normal esophagus, stomach with scattered erosions mottling, friablility, no ulcer or infiltrating process patent pylorus bx= chronic inflammation. next TCS 11/2016  . COLONOSCOPY WITH PROPOFOL N/A 05/25/2017   Dr. Gala Romney: sigmoid diverticulosis, one 4 mm hyerplastic rectal polyp, ascending colonic  AVMs surveillance 2023  . ESOPHAGOGASTRODUODENOSCOPY  1/06   Dr. Volney American esophageal erosions,U-shaped stomach,marked erosions and edema of the bulb without discrete ulcer disease.   . ESOPHAGOGASTRODUODENOSCOPY (EGD) WITH PROPOFOL N/A 05/25/2017   Dr. Gala Romney: reflux esophagitis s/p empiric dilation, normal stomach and duodenum  . MALONEY DILATION N/A 05/25/2017   Procedure: Venia Minks DILATION;  Surgeon: Daneil Dolin, MD;  [facility];  Service: Endoscopy;  Laterality: N/A;  . NECK SURGERY  10/2007   PLATE IN NECK  . POLYPECTOMY  05/25/2017   Procedure: POLYPECTOMY;  Surgeon: Daneil Dolin, MD;  Location: [facility];  Service: Endoscopy;;  rectal  . TRANSURETHRAL RESECTION OF PROSTATE N/A 08/17/2017   Procedure: TRANSURETHRAL RESECTION OF THE PROSTATE (TURP);  Surgeon: Irine Seal, MD;  Location: [facility] ORS;  Service: Urology;  Laterality:  N/A;       HPI  from the history and physical done on the day of admission:    - Bryan [name]  is a 63 y.o. malewith medical history significant ofacid reflux, arthritis, asthma, chronic back pain, COPD, type 2 diabetes, hypertension, history of pneumonia, history of TIA, tubular adenoma presents to the ED for the second time in 4 days with shortness of breath, dizziness and some chest discomfort, O2 sats was 88% on 1/2 L  -COVID test negative on 06/08/2019 repeat COVID test also negative to date 06/13/2019  -Patient was seen in the ED for similar symptoms on 06/09/2019 -He is on home O2 at 2.5 L  --Presents today with increasing shortness of breath at rest, dyspnea on exertion, paroxysmal nocturnal dyspnea and orthopnea,  -Chest x-ray suggestive of pulmonary edema, cannot rule out atypical infection -However procalcitonin is less than 0.10 -Troponin is 8, d-dimer is not elevated at 0.46, lipase is 159 -WBC 8.8 -BNP 388 which is higher than patient's previous baseline -Creatinine is 1.5 which is around patient's baseline -Potassium is noted to be 5.5  -Patient admits to eating bacon pretty much daily, eating food high in salt on a daily basis     Hospital Course:     -1)HFpEF--acute dCHF exacerbation, echo with EF of over 60%, ruled out for ACS by cardiac enzymes and EKG  by-Clinical exam, chest x-ray and BNP suggestee CHF exacerbation on admission--overall much improved with IV diuresis/lasix -Discharge home on torsemide, low-salt diet emphasized   2)COPD -no acute exacerbation at this time, continue bronchodilators and mucolytics continue supplemental oxygen -Procalcitonin is less than 0.10  3) acute on chronic hypoxic respiratory failure--secondary to COPD and CHF as above #1 and 2, at baseline patient uses 2.5 L of oxygen at home, -After diuresis oxygen requirement back to baseline  4) chronic abdominal pain/elevated lipase/chronic constipation --- patient has had  extensive GI work-up in the past, =-Continue Protonix 40 mg p.o. daily.  5)Hypertension- decrease amlodipine to 2.5 mg p.o. daily,   6)Type 2 diabetes mellitus with hyperlipidemia (HCC) -carb consistent and low-fat low-cholesterol diet advised   7)CKD (chronic kidney disease), stage III (HCC) --Renal function appears to be at baseline at this time -renally adjust medications, avoid nephrotoxic agents/dehydration/hypotension  8)BPH (benign prostatic hyperplasia) -Stable continue Flomax  Discharge Condition: Stable,  Follow UP--as advised  Diet and Activity recommendation:  As advised  Discharge Instructions    Discharge Instructions    Call MD for:  difficulty breathing, headache or visual disturbances   Complete by: As directed    Call MD for:  persistant dizziness or light-headedness   Complete by: As directed  Call MD for:  persistant nausea and vomiting   Complete by: As directed    Call MD for:  severe uncontrolled pain   Complete by: As directed    Call MD for:  temperature >100.4   Complete by: As directed    Diet - low sodium heart healthy   Complete by: As directed    Discharge instructions   Complete by: As directed    1)Very low-salt diet advised 2)Weigh yourself daily, call if you gain more than 3 pounds in 1 day or more than 5 pounds in 1 week as your diuretic medications may need to be adjusted 3)Limit your Fluid  intake to no more than 60 ounces (1.8 Liters) per day 4)Avoid Bacon, ham, Hot dogs, sausages as they are very high in salt 5)Reduce your amlodipine/Norvasc to 2.5 mg daily from 5 mg daily 6)Take Torsemide/Demadex fluid pills 20 mg every morning as prescribed 7)Take Flomax/tamsulosin medication for your prostate as prescribed 8) follow-up with the primary care physician in 1 to 2 weeks for recheck and reevaluation 9) continue to use oxygen via nasal cannula at 2.5 to 3 L/min continuously as prescribed   Increase activity slowly    Complete by: As directed        Discharge Medications     Allergies as of 06/14/2019   No Known Allergies     Medication List    TAKE these medications   acetaminophen 325 MG tablet Commonly known as: TYLENOL Take 2 tablets (650 mg total) by mouth every 6 (six) hours as needed for mild pain (or Fever >/= 101).   albuterol (2.5 MG/3ML) 0.083% nebulizer solution Commonly known as: PROVENTIL Take 3 mLs (2.5 mg total) by nebulization every 4 (four) hours as needed for wheezing or shortness of breath. What changed: Another medication with the same name was changed. Make sure you understand how and when to take each.   albuterol 108 (90 Base) MCG/ACT inhaler Commonly known as: VENTOLIN HFA Inhale 2 puffs into the lungs every 4 (four) hours as needed for wheezing or shortness of breath. Do not use with nebulizer What changed: how much to take   amLODipine 2.5 MG tablet Commonly known as: NORVASC Take 1 tablet (2.5 mg total) by mouth daily. What changed:   medication strength  how much to take   DULoxetine 30 MG capsule Commonly known as: CYMBALTA Take 1 capsule by mouth 2 (two) times daily.   ergocalciferol 1.25 MG (50000 UT) capsule Commonly known as: VITAMIN D2 Take 50,000 Units by mouth once a week.   gabapentin 300 MG capsule Commonly known as: NEURONTIN Take 1 capsule (300 mg total) by mouth 2 (two) times daily. Pt. Says he is taking twice daily. What changed: additional instructions   guaiFENesin 600 MG 12 hr tablet Commonly known as: Mucinex Take 1 tablet (600 mg total) by mouth 2 (two) times daily.   linaclotide 72 MCG capsule Commonly known as: Linzess Take 1 capsule (72 mcg total) by mouth daily before breakfast.   MiraLax 17 GM/SCOOP powder Generic drug: polyethylene glycol powder Take 17 g by mouth daily as needed (FOR CONSTIPATION).   ondansetron 4 MG disintegrating tablet Commonly known as: Zofran ODT 71m ODT q4 hours prn nausea/vomit    OXYGEN Inhale 2.5 L into the lungs daily.   pantoprazole 40 MG tablet Commonly known as: PROTONIX Take 1 tablet (40 mg total) by mouth daily before breakfast.   potassium chloride 10 MEQ tablet Commonly known as: KLOR-CON Take  1 tablet (10 mEq total) by mouth every Tuesday, Thursday, Saturday, and Sunday. Start taking on: June 15, 2019   tamsulosin 0.4 MG Caps capsule Commonly known as: FLOMAX Take 1 capsule (0.4 mg total) by mouth daily after supper.   torsemide 20 MG tablet Commonly known as: Demadex Take 1 tablet (20 mg total) by mouth daily.   traMADol 50 MG tablet Commonly known as: ULTRAM Take 1 tablet (50 mg total) by mouth every 6 (six) hours as needed.   Trelegy Ellipta 100-62.5-25 MCG/INH Aepb Generic drug: Fluticasone-Umeclidin-Vilant Inhale 1 puff into the lungs daily.      Major procedures and Radiology Reports - PLEASE review detailed and final reports for all details, in brief -    Ct Abdomen Pelvis W Contrast  Result Date: 06/09/2019 CLINICAL DATA:  63 year old male with shortness of breath and abdominal distension. EXAM: CT ABDOMEN AND PELVIS WITH CONTRAST TECHNIQUE: Multidetector CT imaging of the abdomen and pelvis was performed using the standard protocol following bolus administration of intravenous contrast. CONTRAST:  136m OMNIPAQUE IOHEXOL 300 MG/ML  SOLN COMPARISON:  CT Abdomen and Pelvis 05/23/2018 and earlier. FINDINGS: The patient was scanned in the right lateral decubitus position. Lower chest: Mild scarring at the lung bases appears chronic. Mild respiratory motion. There is anterior basal segment right lower lobe airway thickening on series 5, image 4. No associated pulmonary opacity. No pericardial or pleural effusion. Hepatobiliary: Contracted and negative gallbladder. Negative liver. No bile duct enlargement. Pancreas: Negative. Spleen: Negative. Adrenals/Urinary Tract: Normal adrenal glands. Bilateral renal enhancement and contrast excretion  is symmetric and normal. Decompressed proximal ureters. No nephrolithiasis. Diminutive and unremarkable urinary bladder. Stomach/Bowel: Descending and rectosigmoid colon segments are within normal limits. Likewise, no definite transverse or right colon inflammation. Normal appendix which crosses midline to the left on series 3, image 45. Decompressed and negative terminal ileum. No dilated small bowel. The stomach is moderately distended with gas today, previously was distended with food debris. There is food debris in the antrum. No perigastric inflammation. Duodenum appears negative. No free air, free fluid. Vascular/Lymphatic: Aortoiliac calcified atherosclerosis. Major arterial structures remain patent. Portal venous system is patent. No lymphadenopathy. Reproductive: Negative. Other: No pelvic free fluid. Musculoskeletal: Chronic lumbar spine degeneration and left-side unilateral lumbar fusion. No acute osseous abnormality identified. IMPRESSION: 1. No acute or inflammatory process identified in the abdomen or pelvis. Normal appendix. 2. Partially visible right lower lobe airway thickening, consider acute bronchitis. Otherwise stable lung bases with mild chronic scarring. 3. Aortic Atherosclerosis (ICD10-I70.0). Electronically Signed   By: HGenevie AnnM.D.   On: 06/09/2019 19:44   Dg Chest Port 1 View  Result Date: 06/13/2019 CLINICAL DATA:  Shortness of breath, dizziness EXAM: PORTABLE CHEST 1 VIEW COMPARISON:  01/29/2019 FINDINGS: The heart size and mediastinal contours are within normal limits. Mild, diffuse interstitial pulmonary opacity. The visualized skeletal structures are unremarkable. IMPRESSION: Mild, diffuse interstitial pulmonary opacity, suggesting edema or atypical infection. No focal airspace opacity. Electronically Signed   By: AEddie CandleM.D.   On: 06/13/2019 10:15    Micro Results   Recent Results (from the past 240 hour(s))  SARS Coronavirus 2 ([facility]) Nasopharyngeal Nasopharyngeal Swab     Status: None   Collection Time: 06/09/19  8:15 PM   Specimen: Nasopharyngeal Swab  Result Value Ref Range Status   SARS Coronavirus 2 NEGATIVE NEGATIVE Final    Comment: (NOTE) If result is NEGATIVE SARS-CoV-2 target nucleic acids are NOT  DETECTED. The SARS-CoV-2 RNA is generally detectable in upper and lower  respiratory specimens during the acute phase of infection. The lowest  concentration of SARS-CoV-2 viral copies this assay can detect is 250  copies / mL. A negative result does not preclude SARS-CoV-2 infection  and should not be used as the sole basis for treatment or other  patient management decisions.  A negative result may occur with  improper specimen collection / handling, submission of specimen other  than nasopharyngeal swab, presence of viral mutation(s) within the  areas targeted by this assay, and inadequate number of viral copies  (<250 copies / mL). A negative result must be combined with clinical  observations, patient history, and epidemiological information. If result is POSITIVE SARS-CoV-2 target nucleic acids are DETECTED. The SARS-CoV-2 RNA is generally detectable in upper and lower  respiratory specimens dur ing the acute phase of infection.  Positive  results are indicative of active infection with SARS-CoV-2.  Clinical  correlation with patient history and other diagnostic information is  necessary to determine patient infection status.  Positive results do  not rule out bacterial infection or co-infection with other viruses. If result is PRESUMPTIVE POSTIVE SARS-CoV-2 nucleic acids MAY BE PRESENT.   A presumptive positive result was obtained on the submitted specimen  and confirmed on repeat testing.  While 2019 novel coronavirus  (SARS-CoV-2) nucleic acids may be present in the submitted sample  additional confirmatory testing may be necessary for epidemiological  and / or clinical management  purposes  to differentiate between  SARS-CoV-2 and other Sarbecovirus currently known to infect humans.  If clinically indicated additional testing with an alternate test  methodology [phone]) is advised. The SARS-CoV-2 RNA is generally  detectable in upper and lower respiratory sp ecimens during the acute  phase of infection. The expected result is Negative. Fact Sheet for Patients:  StrictlyIdeas.no Fact Sheet for Healthcare Providers: BankingDealers.co.za This test is not yet approved or cleared by the [place] FDA and has been authorized for detection and/or diagnosis of SARS-CoV-2 by FDA under an Emergency Use Authorization (EUA).  This EUA will remain in effect (meaning this test can be used) for the duration of the COVID-19 declaration under Section 564(b)(1) of the Act, 21 U.S.C. section 360bbb-3(b)(1), unless the authorization is terminated or revoked sooner. Performed at [facility], [address] [place], [place] [address]   SARS Coronavirus 2 [facility] order, Performed in [facility] hospital lab) Nasopharyngeal Nasopharyngeal Swab     Status: None   Collection Time: 06/13/19 11:33 AM   Specimen: Nasopharyngeal Swab  Result Value Ref Range Status   SARS Coronavirus 2 NEGATIVE NEGATIVE Final    Comment: (NOTE) If result is NEGATIVE SARS-CoV-2 target nucleic acids are NOT DETECTED. The SARS-CoV-2 RNA is generally detectable in upper and lower  respiratory specimens during the acute phase of infection. The lowest  concentration of SARS-CoV-2 viral copies this assay can detect is 250  copies / mL. A negative result does not preclude SARS-CoV-2 infection  and should not be used as the sole basis for treatment or other  patient management decisions.  A negative result may occur with  improper specimen collection / handling, submission of specimen other  than nasopharyngeal swab, presence of viral mutation(s) within the   areas targeted by this assay, and inadequate number of viral copies  (<250 copies / mL). A negative result must be combined with clinical  observations, patient history, and epidemiological information. If result is POSITIVE SARS-CoV-2 target  nucleic acids are DETECTED. The SARS-CoV-2 RNA is generally detectable in upper and lower  respiratory specimens dur ing the acute phase of infection.  Positive  results are indicative of active infection with SARS-CoV-2.  Clinical  correlation with patient history and other diagnostic information is  necessary to determine patient infection status.  Positive results do  not rule out bacterial infection or co-infection with other viruses. If result is PRESUMPTIVE POSTIVE SARS-CoV-2 nucleic acids MAY BE PRESENT.   A presumptive positive result was obtained on the submitted specimen  and confirmed on repeat testing.  While 2019 novel coronavirus  (SARS-CoV-2) nucleic acids may be present in the submitted sample  additional confirmatory testing may be necessary for epidemiological  and / or clinical management purposes  to differentiate between  SARS-CoV-2 and other Sarbecovirus currently known to infect humans.  If clinically indicated additional testing with an alternate test  methodology [phone]) is advised. The SARS-CoV-2 RNA is generally  detectable in upper and lower respiratory sp ecimens during the acute  phase of infection. The expected result is Negative. Fact Sheet for Patients:  StrictlyIdeas.no Fact Sheet for Healthcare Providers: BankingDealers.co.za This test is not yet approved or cleared by the [place] FDA and has been authorized for detection and/or diagnosis of SARS-CoV-2 by FDA under an Emergency Use Authorization (EUA).  This EUA will remain in effect (meaning this test can be used) for the duration of the COVID-19 declaration under Section 564(b)(1) of the Act, 21  U.S.C. section 360bbb-3(b)(1), unless the authorization is terminated or revoked sooner. Performed at [facility], [address], [place], [place] [address]    Today   Subjective    Phenix Vandermeulen today has no new complaints, -Dyspnea has improved significantly, no fevers no productive cough        Patient has been seen and examined prior to discharge   Objective   Blood pressure 105/75, pulse 94, temperature 98.3 F (36.8 C), temperature source Oral, resp. rate 20, height _0  (1.575 m), weight 57 kg, SpO2 (!) 88 %.   Intake/Output Summary (Last 24 hours) at 06/14/2019 1418 Last data filed at 06/14/2019 1300 Gross per 24 hour  Intake 480 ml  Output 2800 ml  Net -2320 ml    Exam Gen:- Awake Alert, no acute distress , speaking in complete sentences HEENT:- [place].AT, No sclera icterus Nose- [place] 2.5 L/min Neck-Supple Neck,No JVD,.  Lungs-improving air movement, no wheezing  CV- S1, S2 normal, regular Abd-  +ve B.Sounds, Abd Soft, No tenderness,    Extremity/Skin:- No  edema,   good pulses Psych-affect is appropriate, oriented x3 Neuro-no new focal deficits, no tremors    Data Review   CBC w Diff:  Lab Results  Component Value Date   WBC 7.6 06/14/2019   HGB 13.4 06/14/2019   HCT 46.1 06/14/2019   PLT 235 06/14/2019   LYMPHOPCT 14 06/13/2019   MONOPCT 10 06/13/2019   EOSPCT 1 06/13/2019   BASOPCT 1 06/13/2019    CMP:  Lab Results  Component Value Date   NA 143 06/14/2019   K 4.4 06/14/2019   CL 97 (L) 06/14/2019   CO2 36 (H) 06/14/2019   BUN 23 06/14/2019   CREATININE 1.56 (H) 06/14/2019   CREATININE 1.91 (H) 02/01/2018   PROT 7.6 06/13/2019   ALBUMIN 3.8 06/13/2019   BILITOT 0.5 06/13/2019   ALKPHOS 100 06/13/2019   AST 20 06/13/2019   ALT 17 06/13/2019  .   Total Discharge time is about  29 minutes  Roxan Hockey M.D on 06/14/2019 at 2:18 PM  Go to [email or web address] -  for contact info  [facility] - Office  [phone]

## 2019-06-14 NOTE — Patient Outreach (Signed)
[name] Care Management [place]  06/14/2019  Bryan [name] Jan 20, 1956 [id number]  Confirmed patient has FULL LIS meaning that his generic 64-monthcopays will be $3.40 and brand name 326-monthopays will be $8.95.  Call placed to patient to explain.  Call placed to PCP to have new 3-39-months called in.  Patient should now be able to afford inhalers and Linzess which he has been without for months.  Counseled patient to use Trelegy inhaler daily as directed regardless of breathing.  PLAN: Case Closure   JulRegina EckharmD, BCPMilton [phone]

## 2019-06-14 NOTE — Discharge Instructions (Signed)
1)Very low-salt diet advised 2)Weigh yourself daily, call if you gain more than 3 pounds in 1 day or more than 5 pounds in 1 week as your diuretic medications may need to be adjusted 3)Limit your Fluid  intake to no more than 60 ounces (1.8 Liters) per day 4)Avoid Bacon, ham, Hot dogs, sausages as they are very high in salt 5)Reduce your amlodipine/Norvasc to 2.5 mg daily from 5 mg daily 6)Take Torsemide/Demadex fluid pills 20 mg every morning as prescribed 7)Take Flomax/tamsulosin medication for your prostate as prescribed 8) follow-up with the primary care physician in 1 to 2 weeks for recheck and reevaluation 9) continue to use oxygen via nasal cannula at 2.5 to 3 L/min continuously as prescribed

## 2019-06-18 ENCOUNTER — Other Ambulatory Visit: Payer: Self-pay

## 2019-06-18 IMAGING — DX DG CHEST 2V
2 series · 2 of 2 positions shown · non-contrast
Comparison: 07/22/2017 chest radiograph.

CLINICAL DATA: 61 y/o M; central nonradiating chest pain. Shortness
of breath, dizziness, weakness.

EXAM:
CHEST - 2 VIEW

[chest lat]
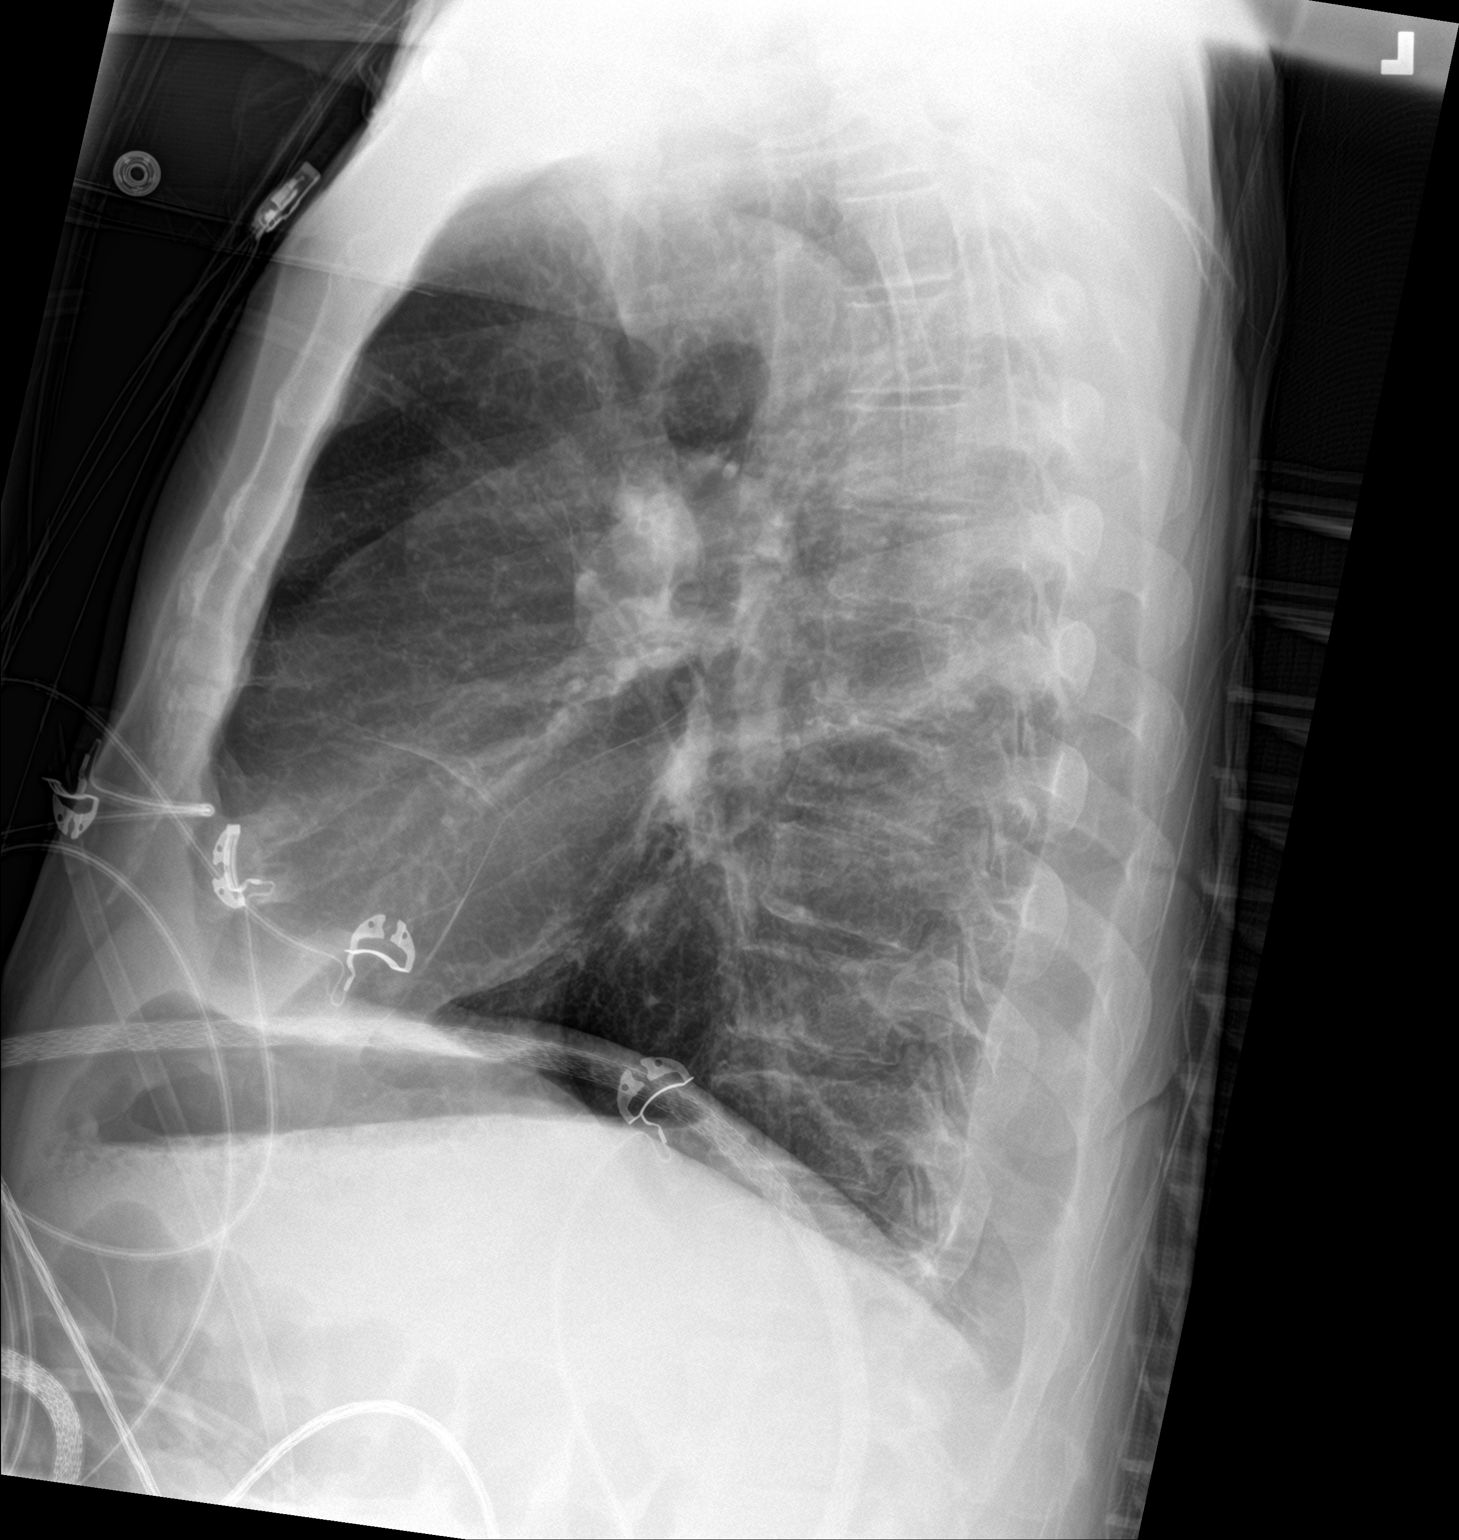

[chest ap]
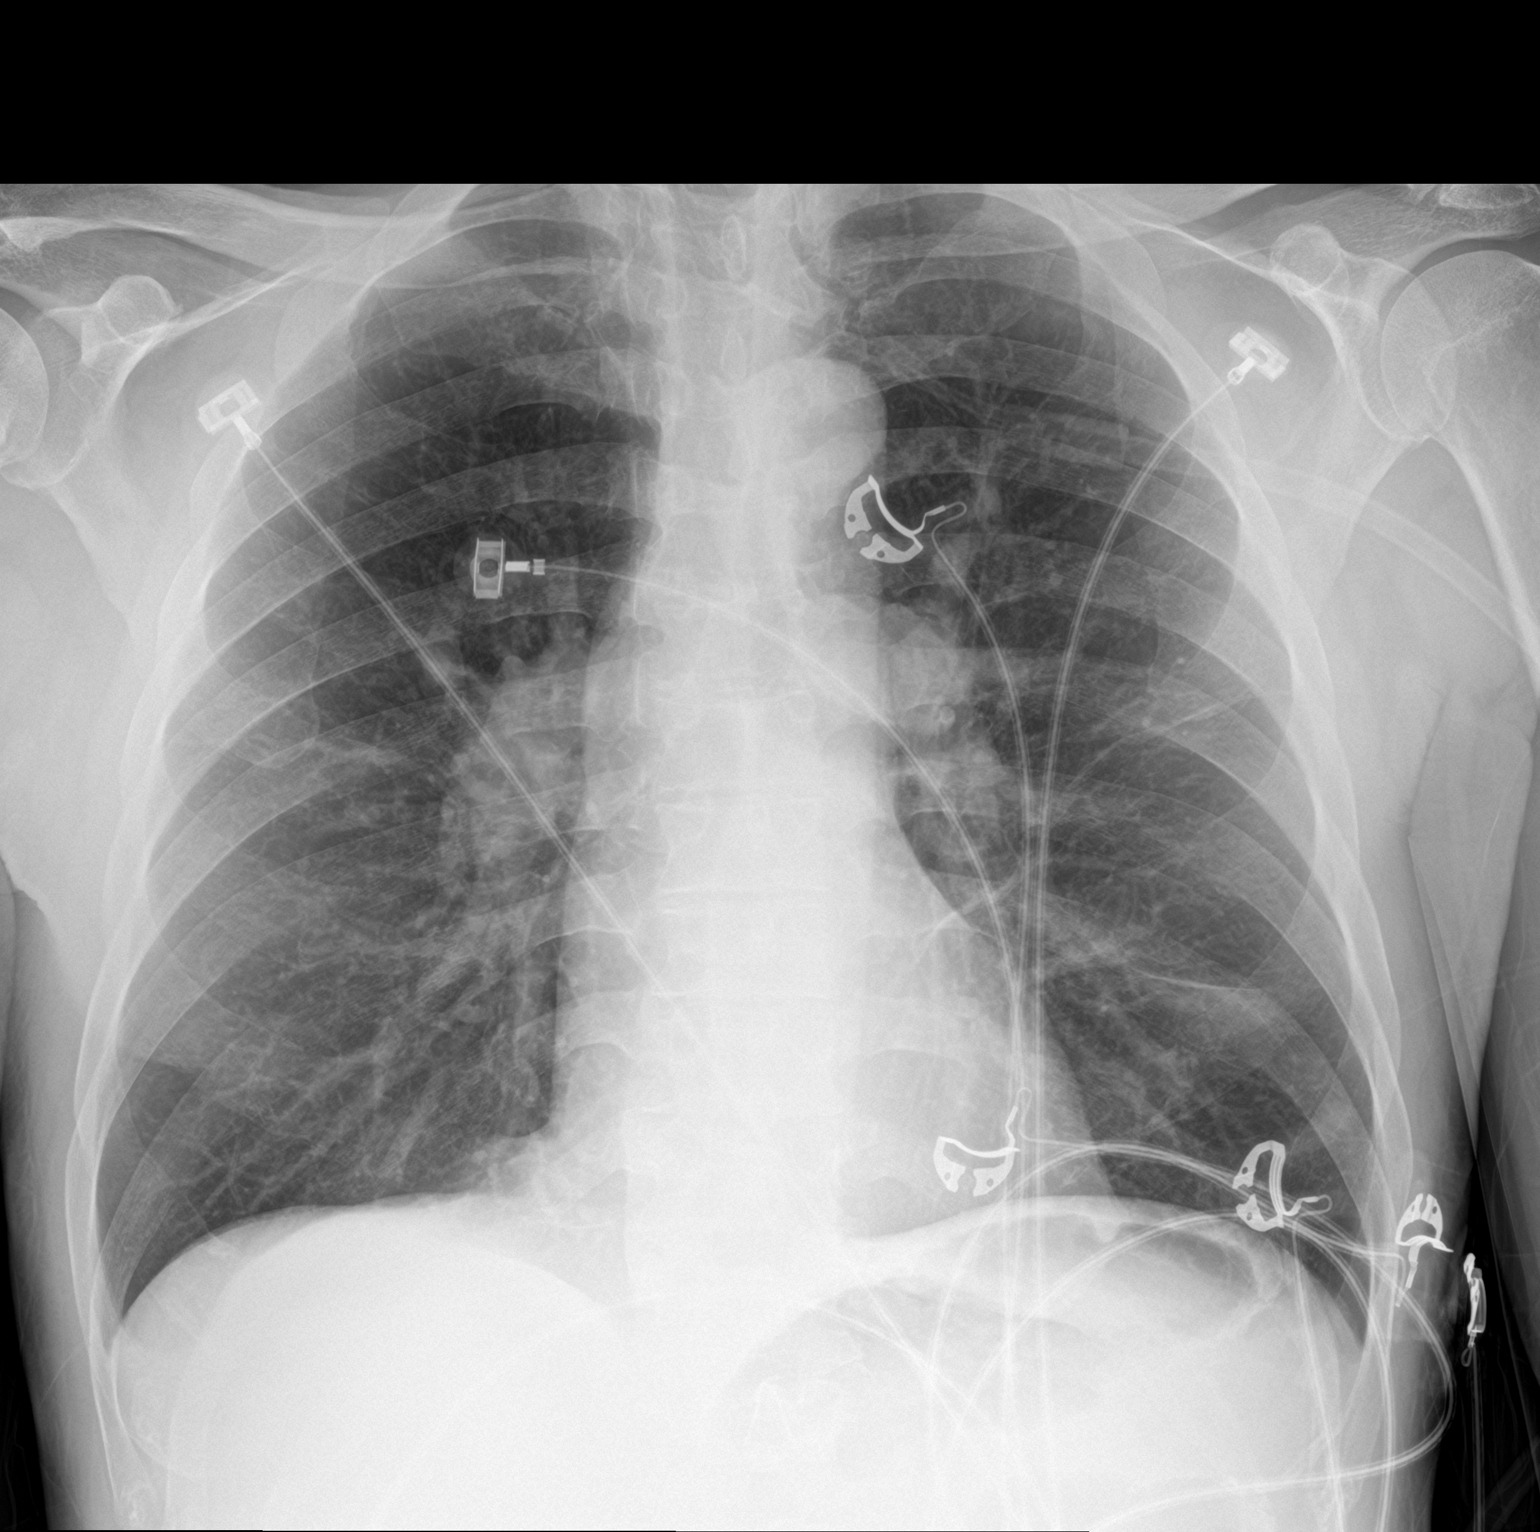

[2 of 2 positions shown; findings below may reference images not displayed]

FINDINGS: Stable cardiac silhouette given projection and technique. Stable
enlarged central pulmonary arteries may represent pulmonary artery
hypertension. No consolidation, effusion, or pneumothorax. Partially
visualized anterior cervical fusion hardware. No acute osseous
abnormality is evident.
IMPRESSION: 1. No acute pulmonary process identified.
2. Stable enlarged central pulmonary arteries may represent
pulmonary artery hypertension.

By: Ferienhaus Erxleben M.D.

## 2019-06-18 NOTE — Patient Outreach (Signed)
[place] [facility]) Care Management  06/18/2019  Bryan [name] 1956-08-26 [id number]    Successful outreach with Bryan [name]. He was admitted on 06/13/19 due to Acute congestive heart failure. He was discharged on 06/14/19.  He reports doing well today. Denies complaints of worsening shortness of breath since returning home.  Denies increased edema to his abdomen or lower extremities. Reports a few episodes of intermittent chest discomfort. Describes the discomfort as a "quick tightening." Reports the symptoms are being caused by gastric reflux. Reports the episodes are not severe and  resolve quickly. He is aware of worsening signs and symptoms and indications for seeking immediate medical attention. He will follow-up with his primary care provider, Dr. Legrand Rams, on 06/21/19.  He reports taking all medications as prescribed. He reports not weighing daily due to not having a reliable scale. Will request assistance to have a new scale mailed to his home. Current Outpatient Medications on File Prior to Visit  Medication Sig Dispense Refill  . acetaminophen (TYLENOL) 325 MG tablet Take 2 tablets (650 mg total) by mouth every 6 (six) hours as needed for mild pain (or Fever >/= 101). 12 tablet 0  . albuterol (PROVENTIL) (2.5 MG/3ML) 0.083% nebulizer solution Take 3 mLs (2.5 mg total) by nebulization every 4 (four) hours as needed for wheezing or shortness of breath. 75 mL 5  . albuterol (VENTOLIN HFA) 108 (90 Base) MCG/ACT inhaler Inhale 2 puffs into the lungs every 4 (four) hours as needed for wheezing or shortness of breath. Do not use with nebulizer 18 g 2  . amLODipine (NORVASC) 2.5 MG tablet Take 1 tablet (2.5 mg total) by mouth daily. 30 tablet 11  . DULoxetine (CYMBALTA) 30 MG capsule Take 1 capsule by mouth 2 (two) times daily.    . ergocalciferol (VITAMIN D2) 1.25 MG (50000 UT) capsule Take 50,000 Units by mouth once a week.    . Fluticasone-Umeclidin-Vilant (TRELEGY ELLIPTA) 100-62.5-25  MCG/INH AEPB Inhale 1 puff into the lungs daily. 28 each 5  . gabapentin (NEURONTIN) 300 MG capsule Take 1 capsule (300 mg total) by mouth 2 (two) times daily. Pt. Says he is taking twice daily.    Marland Kitchen guaiFENesin (MUCINEX) 600 MG 12 hr tablet Take 1 tablet (600 mg total) by mouth 2 (two) times daily. 60 tablet 2  . linaclotide (LINZESS) 72 MCG capsule Take 1 capsule (72 mcg total) by mouth daily before breakfast. 90 capsule 3  . ondansetron (ZOFRAN ODT) 4 MG disintegrating tablet 94m ODT q4 hours prn nausea/vomit 12 tablet 0  . OXYGEN Inhale 2.5 L into the lungs daily.    . pantoprazole (PROTONIX) 40 MG tablet Take 1 tablet (40 mg total) by mouth daily before breakfast. 90 tablet 3  . polyethylene glycol powder (MIRALAX) 17 GM/SCOOP powder Take 17 g by mouth daily as needed (FOR CONSTIPATION).     .Marland Kitchenpotassium chloride (KLOR-CON) 10 MEQ tablet Take 1 tablet (10 mEq total) by mouth every Tuesday, Thursday, Saturday, and Sunday. 16 tablet 0  . tamsulosin (FLOMAX) 0.4 MG CAPS capsule Take 1 capsule (0.4 mg total) by mouth daily after supper. 30 capsule 5  . torsemide (DEMADEX) 20 MG tablet Take 1 tablet (20 mg total) by mouth daily. 30 tablet 5  . traMADol (ULTRAM) 50 MG tablet Take 1 tablet (50 mg total) by mouth every 6 (six) hours as needed. 20 tablet 0   No current facility-administered medications on file prior to visit.     PLAN -Will request assistance  for home scale. -Will follow-up within the next week.   [place] [facility] [phone]

## 2019-06-20 ENCOUNTER — Other Ambulatory Visit: Payer: Self-pay

## 2019-06-20 ENCOUNTER — Ambulatory Visit (HOSPITAL_COMMUNITY)
Admission: RE | Admit: 2019-06-20 | Discharge: 2019-06-20 | Disposition: A | Discharge status: Home / Self Care | Payer: Medicare Other | Source: Ambulatory Visit | Attending: Internal Medicine | Admitting: Internal Medicine

## 2019-06-20 DIAGNOSIS — M48061 Spinal stenosis, lumbar region without neurogenic claudication: Secondary | ICD-10-CM | POA: Diagnosis not present

## 2019-06-20 DIAGNOSIS — R062 Wheezing: Secondary | ICD-10-CM | POA: Diagnosis not present

## 2019-06-20 DIAGNOSIS — J441 Chronic obstructive pulmonary disease with (acute) exacerbation: Secondary | ICD-10-CM | POA: Diagnosis not present

## 2019-06-20 DIAGNOSIS — M5416 Radiculopathy, lumbar region: Secondary | ICD-10-CM | POA: Diagnosis not present

## 2019-06-20 DIAGNOSIS — J4520 Mild intermittent asthma, uncomplicated: Secondary | ICD-10-CM | POA: Diagnosis not present

## 2019-06-20 DIAGNOSIS — J449 Chronic obstructive pulmonary disease, unspecified: Secondary | ICD-10-CM | POA: Diagnosis not present

## 2019-06-25 DIAGNOSIS — N390 Urinary tract infection, site not specified: Secondary | ICD-10-CM | POA: Diagnosis not present

## 2019-06-26 ENCOUNTER — Other Ambulatory Visit: Payer: Self-pay

## 2019-06-26 DIAGNOSIS — I509 Heart failure, unspecified: Secondary | ICD-10-CM | POA: Diagnosis not present

## 2019-06-26 DIAGNOSIS — N189 Chronic kidney disease, unspecified: Secondary | ICD-10-CM | POA: Diagnosis not present

## 2019-06-26 DIAGNOSIS — J449 Chronic obstructive pulmonary disease, unspecified: Secondary | ICD-10-CM | POA: Diagnosis not present

## 2019-06-26 DIAGNOSIS — J9611 Chronic respiratory failure with hypoxia: Secondary | ICD-10-CM | POA: Diagnosis not present

## 2019-06-26 DIAGNOSIS — E1142 Type 2 diabetes mellitus with diabetic polyneuropathy: Secondary | ICD-10-CM | POA: Diagnosis not present

## 2019-06-26 NOTE — Patient Outreach (Signed)
[place] [facility]) Care Management  06/26/2019  Bryan [name] 08-14-1956 [id number]    Attempted outreach with [name]. Left voice message requesting a return call.   PLAN -Will follow-up within 3 business days.  [place] [facility] [phone]

## 2019-07-05 ENCOUNTER — Other Ambulatory Visit: Payer: Self-pay

## 2019-07-05 ENCOUNTER — Ambulatory Visit (INDEPENDENT_AMBULATORY_CARE_PROVIDER_SITE_OTHER): Payer: Medicare Other | Admitting: Urology

## 2019-07-05 DIAGNOSIS — R351 Nocturia: Secondary | ICD-10-CM

## 2019-07-05 DIAGNOSIS — N3281 Overactive bladder: Secondary | ICD-10-CM | POA: Diagnosis not present

## 2019-07-05 DIAGNOSIS — R102 Pelvic and perineal pain: Secondary | ICD-10-CM | POA: Diagnosis not present

## 2019-07-09 DIAGNOSIS — J4520 Mild intermittent asthma, uncomplicated: Secondary | ICD-10-CM | POA: Diagnosis not present

## 2019-07-09 DIAGNOSIS — J441 Chronic obstructive pulmonary disease with (acute) exacerbation: Secondary | ICD-10-CM | POA: Diagnosis not present

## 2019-07-09 DIAGNOSIS — J449 Chronic obstructive pulmonary disease, unspecified: Secondary | ICD-10-CM | POA: Diagnosis not present

## 2019-07-09 DIAGNOSIS — R062 Wheezing: Secondary | ICD-10-CM | POA: Diagnosis not present

## 2019-07-21 DIAGNOSIS — R062 Wheezing: Secondary | ICD-10-CM | POA: Diagnosis not present

## 2019-07-21 DIAGNOSIS — J441 Chronic obstructive pulmonary disease with (acute) exacerbation: Secondary | ICD-10-CM | POA: Diagnosis not present

## 2019-07-21 DIAGNOSIS — J4520 Mild intermittent asthma, uncomplicated: Secondary | ICD-10-CM | POA: Diagnosis not present

## 2019-07-21 DIAGNOSIS — J449 Chronic obstructive pulmonary disease, unspecified: Secondary | ICD-10-CM | POA: Diagnosis not present

## 2019-07-29 NOTE — Progress Notes (Signed)
Referring Provider: Rosita Fire, MD Primary Care Physician:  Rosita Fire, MD Primary GI: Dr. Gala Romney   Chief Complaint  Patient presents with  . Gastroesophageal Reflux    f/u.  Marland Kitchen Abdominal Pain    occas flare    HPI:   Bryan [name] is a 63 y.o. male presenting today with a history of chronic abdominal pain, constipation, GERD, esophagitis, dysphagia despite empiric dilation in Sept 2018. BPE without motility issues. Abdominal pain thoroughly evaluated with HIDA, multiple CTs. Chronic abdominal pain felt multifactorial in setting of constipation, GERD/gastritis/esophagitis, possible musculoskeletal component. CT abd/pelvis with contrast in Sept 2020 without acute findings.   115 July 2019, 120 Aug 2019, 127 in Dec 2019.March 2020 134. June 2020 127. Aug 2020 was 122. Today 124.   No abdominal pain yesterday but woke with abdominal pain today. Ate boiled pig ears yesterday. Ate fried chicken from PGs. Fried potato wedges. Woke up with abdominal pain today. Quit taking torsemide because every time he took this, noticed abdominal pain. Linzess 72 mcg for constipation. Works when it wants to work. Willing to try samples of 145 mcg. No N/V. Good appetite.   CTA chest 4 mm nodule in left lower lobe Aug 2019. Recommend non-contrast chest CT in 12 months. History of smoking 1-1.5 ppd X 45 years, no smoking in 4 years. No family history of lung cancer.However, he recently had CTA chest Jan 2020 due to SOB and chest pain, without evidence of lung nodule   Past Medical History:  Diagnosis Date  . Acid reflux   . Arthritis   . Asthma   . Chronic pain    with leg and back pain (disc problem)  . COPD (chronic obstructive pulmonary disease) ([place])   . Diabetes mellitus without complication ([place])   . Headache    HX OF  . History of upper GI x-ray series    to follow showed large duodenal ulcer H pylori serologies were negative  . Hypertension   . Pneumonia    07/17/17  .  Pre-diabetes   . Stroke [facility])    TIA MINI STROKE  . Tubular adenoma     Past Surgical History:  Procedure Laterality Date  . BACK SURGERY  7/05; 5/09    Dr.Hirsch,3 lumbar  X3  . BACK SURGERY    . COLONOSCOPY  Feb 2012   Dr. Gala Romney: normal rectum, pedunculate polyp removed but not recovered  . COLONOSCOPY WITH ESOPHAGOGASTRODUODENOSCOPY (EGD) N/A 11/18/2013   Dr.Rourk- tcs= normal rectum, multipe polyps about the ileocecal valve and distal transverse colon o/w the remainder of the colonic mucosa appeared normal bx= tubular adenoma. EGD= normal esophagus, stomach with scattered erosions mottling, friablility, no ulcer or infiltrating process patent pylorus bx= chronic inflammation. next TCS 11/2016  . COLONOSCOPY WITH PROPOFOL N/A 05/25/2017   Dr. Gala Romney: sigmoid diverticulosis, one 4 mm hyerplastic rectal polyp, ascending colonic AVMs surveillance 2023  . ESOPHAGOGASTRODUODENOSCOPY  1/06   Dr. Volney American esophageal erosions,U-shaped stomach,marked erosions and edema of the bulb without discrete ulcer disease.   . ESOPHAGOGASTRODUODENOSCOPY (EGD) WITH PROPOFOL N/A 05/25/2017   Dr. Gala Romney: reflux esophagitis s/p empiric dilation, normal stomach and duodenum  . MALONEY DILATION N/A 05/25/2017   Procedure: Venia Minks DILATION;  Surgeon: Daneil Dolin, MD;  Location: [facility] ENDO SUITE;  Service: Endoscopy;  Laterality: N/A;  . NECK SURGERY  10/2007   PLATE IN NECK  . POLYPECTOMY  05/25/2017   Procedure: POLYPECTOMY;  Surgeon: Daneil Dolin, MD;  Location: [facility] ENDO SUITE;  Service: Endoscopy;;  rectal  . TRANSURETHRAL RESECTION OF PROSTATE N/A 08/17/2017   Procedure: TRANSURETHRAL RESECTION OF THE PROSTATE (TURP);  Surgeon: Irine Seal, MD;  Location: [facility] ORS;  Service: Urology;  Laterality: N/A;    Current Outpatient Medications  Medication Sig Dispense Refill  . acetaminophen (TYLENOL) 325 MG tablet Take 2 tablets (650 mg total) by mouth every 6 (six) hours as needed for mild pain (or Fever >/= 101).  12 tablet 0  . albuterol (PROVENTIL) (2.5 MG/3ML) 0.083% nebulizer solution Take 3 mLs (2.5 mg total) by nebulization every 4 (four) hours as needed for wheezing or shortness of breath. 75 mL 5  . albuterol (VENTOLIN HFA) 108 (90 Base) MCG/ACT inhaler Inhale 2 puffs into the lungs every 4 (four) hours as needed for wheezing or shortness of breath. Do not use with nebulizer 18 g 2  . amLODipine (NORVASC) 2.5 MG tablet Take 1 tablet (2.5 mg total) by mouth daily. 30 tablet 11  . Fluticasone-Umeclidin-Vilant (TRELEGY ELLIPTA) 100-62.5-25 MCG/INH AEPB Inhale 1 puff into the lungs daily. 28 each 5  . gabapentin (NEURONTIN) 300 MG capsule Take 1 capsule (300 mg total) by mouth 2 (two) times daily. Pt. Says he is taking twice daily. (Patient taking differently: Take 600 mg by mouth 2 (two) times daily. Pt. Says he is taking twice daily.)    . guaiFENesin (MUCINEX) 600 MG 12 hr tablet Take 1 tablet (600 mg total) by mouth 2 (two) times daily. 60 tablet 2  . linaclotide (LINZESS) 72 MCG capsule Take 1 capsule (72 mcg total) by mouth daily before breakfast. 90 capsule 3  . OXYGEN Inhale 2.5 L into the lungs daily.    . pantoprazole (PROTONIX) 40 MG tablet Take 1 tablet (40 mg total) by mouth daily before breakfast. 90 tablet 3  . polyethylene glycol powder (MIRALAX) 17 GM/SCOOP powder Take 17 g by mouth daily as needed (FOR CONSTIPATION).     Marland Kitchen potassium chloride (KLOR-CON) 10 MEQ tablet Take 1 tablet (10 mEq total) by mouth every Tuesday, Thursday, Saturday, and Sunday. 16 tablet 0  . tamsulosin (FLOMAX) 0.4 MG CAPS capsule Take 1 capsule (0.4 mg total) by mouth daily after supper. 30 capsule 5   No current facility-administered medications for this visit.     Allergies as of 07/30/2019  . (No Known Allergies)    Family History  Problem Relation Age of Onset  . Cancer Father   . Asthma Mother   . Colon cancer Neg Hx     Social History   Socioeconomic History  . Marital status: Single    Spouse  name: Not on file  . Number of children: 2  . Years of education: Not on file  . Highest education level: Not on file  Occupational History  . Occupation: disabled    Fish farm manager: UNEMPLOYED  Social Needs  . Financial resource strain: Not hard at all  . Food insecurity    Worry: Never true    Inability: Never true  . Transportation needs    Medical: No    Non-medical: No  Tobacco Use  . Smoking status: Former Smoker    Packs/day: 0.50    Years: 40.00    Pack years: 20.00    Types: Cigarettes    Quit date: 11/11/2014    Years since quitting: 4.7  . Smokeless tobacco: Never Used  Substance and Sexual Activity  . Alcohol use: No    Alcohol/week: 0.0 standard drinks  .  Drug use: Yes    Types: Marijuana    Comment: occas  . Sexual activity: Not Currently    Birth control/protection: None  Lifestyle  . Physical activity    Days per week: 0 days    Minutes per session: 0 min  . Stress: Not at all  Relationships  . Social connections    Talks on phone: More than three times a week    Gets together: More than three times a week    Attends religious service: Never    Active member of club or organization: No    Attends meetings of clubs or organizations: Never    Relationship status: Never married  Other Topics Concern  . Not on file  Social History Narrative   Lives with girlfriend and son, is on disability for history of back injuries.    Review of Systems: Gen: Denies fever, chills, anorexia. Denies fatigue, weakness, weight loss.  CV: Denies chest pain, palpitations, syncope, peripheral edema, and claudication. Resp: Denies dyspnea at rest, cough, wheezing, coughing up blood, and pleurisy. GI: see HPI Derm: Denies rash, itching, dry skin Psych: Denies depression, anxiety, memory loss, confusion. No homicidal or suicidal ideation.  Heme: Denies bruising, bleeding, and enlarged lymph nodes.  Physical Exam: BP 109/71   Pulse (!) 108   Temp 98.2 F (36.8 C) (Oral)    Ht _0  (1.6 m)   Wt 124 lb 3.2 oz (56.3 kg)   BMI 22.00 kg/m  General:   Alert and oriented. No distress noted. Pleasant and cooperative.  Head:  Normocephalic and atraumatic. Abdomen:  +BS, soft, TTP midline abdomen, diastasis recti noted, and non-distended. No rebound or guarding. No HSM or masses noted. Msk:  Symmetrical without gross deformities. Normal posture. Extremities:  Without edema. Neurologic:  Alert and  oriented x4 Psych:  Alert and cooperative. Normal mood and affect.  ASSESSMENT: Bryan [name] is a 63 y.o. male presenting today with long-standing history of chronic abdominal pain likely multifactorial in setting of constipation, GERD, musculoskeletal, without any alarm signs/symptoms. Weight has been stable. Constipation not ideally managed with low dose Linzess, so we will increase this. May benefit from FODMAP diet and slowly reintroducing foods. Abdominal pain seems to correlate with eating choices, and we discussed this today.   PLAN:  Trial Linzess 145 mcg daily Protonix daily FODMAP diet provided Avoid trigger foods Return in 6 months or sooner if needed  Colonoscopy 2023   [name], PhD, [facility]

## 2019-07-30 ENCOUNTER — Encounter: Payer: Self-pay | Admitting: Gastroenterology

## 2019-07-30 ENCOUNTER — Ambulatory Visit (INDEPENDENT_AMBULATORY_CARE_PROVIDER_SITE_OTHER): Payer: Medicare Other | Admitting: Gastroenterology

## 2019-07-30 ENCOUNTER — Other Ambulatory Visit: Payer: Self-pay

## 2019-07-30 VITALS — BP 109/71 | HR 108 | Temp 98.2°F | Ht 63.0 in | Wt 124.2 lb

## 2019-07-30 DIAGNOSIS — R1010 Upper abdominal pain, unspecified: Secondary | ICD-10-CM

## 2019-07-30 DIAGNOSIS — K219 Gastro-esophageal reflux disease without esophagitis: Secondary | ICD-10-CM

## 2019-07-30 DIAGNOSIS — K5900 Constipation, unspecified: Secondary | ICD-10-CM

## 2019-07-30 NOTE — Patient Instructions (Addendum)
Let's try Linzess middle dose (145 micrograms) instead of the lower dose you are taking. Let me know how this works for you!  Continue taking Protonix once daily. Avoid foods that trigger your pain such as fried foods, fatty foods. Please see list below. This helps you cut out foods that may be causing problems, then slowly introduce back to your diet and see which ones are causing issues.  We will see you in 6 months or sooner if needed!  I enjoyed seeing you again today! As you know, I value our relationship and want to provide genuine, compassionate, and quality care. I welcome your feedback. If you receive a survey regarding your visit,  I greatly appreciate you taking time to fill this out. See you next time!  [name], PhD, ANP-BC [facility]   Low-FODMAP Eating Plan  FODMAPs (fermentable oligosaccharides, disaccharides, monosaccharides, and polyols) are sugars that are hard for some people to digest. A low-FODMAP eating plan may help some people who have bowel (intestinal) diseases to manage their symptoms. This meal plan can be complicated to follow. Work with a diet and nutrition specialist (dietitian) to make a low-FODMAP eating plan that is right for you. A dietitian can make sure that you get enough nutrition from this diet. What are tips for following this plan? Reading food labels  Check labels for hidden FODMAPs such as: ? High-fructose syrup. ? Honey. ? Agave. ? Natural fruit flavors. ? Onion or garlic powder.  Choose low-FODMAP foods that contain 3-4 grams of fiber per serving.  Check food labels for serving sizes. Eat only one serving at a time to make sure FODMAP levels stay low. Meal planning  Follow a low-FODMAP eating plan for up to 6 weeks, or as told by your health care provider or dietitian.  To follow the eating plan: 1. Eliminate high-FODMAP foods from your diet completely. 2. Gradually reintroduce high-FODMAP foods into your diet one  at a time. Most people should wait a few days after introducing one high-FODMAP food before they introduce the next high-FODMAP food. Your dietitian can recommend how quickly you may reintroduce foods. 3. Keep a daily record of what you eat and drink, and make note of any symptoms that you have after eating. 4. Review your daily record with a dietitian regularly. Your dietitian can help you identify which foods you can eat and which foods you should avoid. General tips  Drink enough fluid each day to keep your urine pale yellow.  Avoid processed foods. These often have added sugar and may be high in FODMAPs.  Avoid most dairy products, whole grains, and sweeteners.  Work with a dietitian to make sure you get enough fiber in your diet. Recommended foods Grains  Gluten-free grains, such as rice, oats, buckwheat, quinoa, corn, polenta, and millet. Gluten-free pasta, bread, or cereal. Rice noodles. Corn tortillas. Vegetables  Eggplant, zucchini, cucumber, peppers, green beans, Brussels sprouts, bean sprouts, lettuce, arugula, kale, Swiss chard, spinach, collard greens, bok choy, summer squash, potato, and tomato. Limited amounts of corn, carrot, and sweet potato. Green parts of scallions. Fruits  Bananas, oranges, lemons, limes, blueberries, raspberries, strawberries, grapes, cantaloupe, honeydew melon, kiwi, papaya, passion fruit, and pineapple. Limited amounts of dried cranberries, banana chips, and shredded coconut. Dairy  Lactose-free milk, yogurt, and kefir. Lactose-free cottage cheese and ice cream. Non-dairy milks, such as almond, coconut, hemp, and rice milk. Yogurts made of non-dairy milks. Limited amounts of goat cheese, brie, mozzarella, parmesan, swiss, and other hard  cheeses. Meats and other protein foods  Unseasoned beef, pork, poultry, or fish. Eggs. Berniece Salines. Tofu (firm) and tempeh. Limited amounts of nuts and seeds, such as almonds, walnuts, [place] nuts, pecans, peanuts, pumpkin  seeds, chia seeds, and sunflower seeds. Fats and oils  Butter-free spreads. Vegetable oils, such as olive, canola, and sunflower oil. Seasoning and other foods  Artificial sweeteners with names that do not end in "ol" such as aspartame, saccharine, and stevia. Maple syrup, white table sugar, raw sugar, brown sugar, and molasses. Fresh basil, coriander, parsley, rosemary, and thyme. Beverages  Water and mineral water. Sugar-sweetened soft drinks. Small amounts of orange juice or cranberry juice. Black and green tea. Most dry wines. Coffee. This may not be a complete list of low-FODMAP foods. Talk with your dietitian for more information. Foods to avoid Grains  Wheat, including kamut, durum, and semolina. Barley and bulgur. Couscous. Wheat-based cereals. Wheat noodles, bread, crackers, and pastries. Vegetables  Chicory root, artichoke, asparagus, cabbage, snow peas, sugar snap peas, mushrooms, and cauliflower. Onions, garlic, leeks, and the white part of scallions. Fruits  Fresh, dried, and juiced forms of apple, pear, watermelon, peach, plum, cherries, apricots, blackberries, boysenberries, figs, nectarines, and mango. Avocado. Dairy  Milk, yogurt, ice cream, and soft cheese. Cream and sour cream. Milk-based sauces. Custard. Meats and other protein foods  Fried or fatty meat. Sausage. Cashews and pistachios. Soybeans, baked beans, black beans, chickpeas, kidney beans, fava beans, navy beans, lentils, and split peas. Seasoning and other foods  Any sugar-free gum or candy. Foods that contain artificial sweeteners such as sorbitol, mannitol, isomalt, or xylitol. Foods that contain honey, high-fructose corn syrup, or agave. Bouillon, vegetable stock, beef stock, and chicken stock. Garlic and onion powder. Condiments made with onion, such as hummus, chutney, pickles, relish, salad dressing, and salsa. Tomato paste. Beverages  Chicory-based drinks. Coffee substitutes. Chamomile tea. Fennel  tea. Sweet or fortified wines such as port or sherry. Diet soft drinks made with isomalt, mannitol, maltitol, sorbitol, or xylitol. Apple, pear, and mango juice. Juices with high-fructose corn syrup. This may not be a complete list of high-FODMAP foods. Talk with your dietitian to discuss what dietary choices are best for you.  Summary  A low-FODMAP eating plan is a short-term diet that eliminates FODMAPs from your diet to help ease symptoms of certain bowel diseases.  The eating plan usually lasts up to 6 weeks. After that, high-FODMAP foods are restarted gradually, one at a time, so you can find out which may be causing symptoms.  A low-FODMAP eating plan can be complicated. It is best to work with a dietitian who has experience with this type of plan. This information is not intended to replace advice given to you by your health care provider. Make sure you discuss any questions you have with your health care provider. Document Released: 04/25/2017 Document Revised: 08/11/2017 Document Reviewed: 04/25/2017 Elsevier Patient Education  2020 [employer].

## 2019-08-02 ENCOUNTER — Inpatient Hospital Stay (HOSPITAL_COMMUNITY)
Admission: EM | Admit: 2019-08-02 | Discharge: 2019-08-05 | DRG: 190 | Disposition: A | Discharge status: Home / Self Care | Payer: Medicare Other | Attending: Internal Medicine | Admitting: Internal Medicine

## 2019-08-02 ENCOUNTER — Observation Stay (HOSPITAL_COMMUNITY): Payer: Medicare Other

## 2019-08-02 ENCOUNTER — Encounter (HOSPITAL_COMMUNITY): Payer: Self-pay | Admitting: *Deleted

## 2019-08-02 ENCOUNTER — Other Ambulatory Visit: Payer: Self-pay

## 2019-08-02 ENCOUNTER — Emergency Department (HOSPITAL_COMMUNITY): Payer: Medicare Other

## 2019-08-02 DIAGNOSIS — I2721 Secondary pulmonary arterial hypertension: Secondary | ICD-10-CM | POA: Diagnosis not present

## 2019-08-02 DIAGNOSIS — K2100 Gastro-esophageal reflux disease with esophagitis, without bleeding: Secondary | ICD-10-CM | POA: Diagnosis present

## 2019-08-02 DIAGNOSIS — R1010 Upper abdominal pain, unspecified: Secondary | ICD-10-CM | POA: Diagnosis not present

## 2019-08-02 DIAGNOSIS — Z20828 Contact with and (suspected) exposure to other viral communicable diseases: Secondary | ICD-10-CM | POA: Diagnosis present

## 2019-08-02 DIAGNOSIS — R079 Chest pain, unspecified: Secondary | ICD-10-CM | POA: Diagnosis not present

## 2019-08-02 DIAGNOSIS — N1832 Chronic kidney disease, stage 3b: Secondary | ICD-10-CM | POA: Diagnosis not present

## 2019-08-02 DIAGNOSIS — J189 Pneumonia, unspecified organism: Secondary | ICD-10-CM | POA: Diagnosis present

## 2019-08-02 DIAGNOSIS — R1084 Generalized abdominal pain: Secondary | ICD-10-CM | POA: Diagnosis not present

## 2019-08-02 DIAGNOSIS — Z8711 Personal history of peptic ulcer disease: Secondary | ICD-10-CM | POA: Diagnosis not present

## 2019-08-02 DIAGNOSIS — R0902 Hypoxemia: Secondary | ICD-10-CM | POA: Diagnosis not present

## 2019-08-02 DIAGNOSIS — J9621 Acute and chronic respiratory failure with hypoxia: Secondary | ICD-10-CM | POA: Diagnosis not present

## 2019-08-02 DIAGNOSIS — Z9981 Dependence on supplemental oxygen: Secondary | ICD-10-CM

## 2019-08-02 DIAGNOSIS — J440 Chronic obstructive pulmonary disease with acute lower respiratory infection: Secondary | ICD-10-CM | POA: Diagnosis present

## 2019-08-02 DIAGNOSIS — I130 Hypertensive heart and chronic kidney disease with heart failure and stage 1 through stage 4 chronic kidney disease, or unspecified chronic kidney disease: Secondary | ICD-10-CM | POA: Diagnosis not present

## 2019-08-02 DIAGNOSIS — J441 Chronic obstructive pulmonary disease with (acute) exacerbation: Secondary | ICD-10-CM | POA: Diagnosis not present

## 2019-08-02 DIAGNOSIS — Z743 Need for continuous supervision: Secondary | ICD-10-CM | POA: Diagnosis not present

## 2019-08-02 DIAGNOSIS — Z8673 Personal history of transient ischemic attack (TIA), and cerebral infarction without residual deficits: Secondary | ICD-10-CM

## 2019-08-02 DIAGNOSIS — R519 Headache, unspecified: Secondary | ICD-10-CM | POA: Diagnosis present

## 2019-08-02 DIAGNOSIS — Z825 Family history of asthma and other chronic lower respiratory diseases: Secondary | ICD-10-CM | POA: Diagnosis not present

## 2019-08-02 DIAGNOSIS — K921 Melena: Secondary | ICD-10-CM | POA: Diagnosis present

## 2019-08-02 DIAGNOSIS — R0789 Other chest pain: Secondary | ICD-10-CM | POA: Diagnosis not present

## 2019-08-02 DIAGNOSIS — K589 Irritable bowel syndrome without diarrhea: Secondary | ICD-10-CM | POA: Diagnosis not present

## 2019-08-02 DIAGNOSIS — Z79899 Other long term (current) drug therapy: Secondary | ICD-10-CM | POA: Diagnosis not present

## 2019-08-02 DIAGNOSIS — R000 Tachycardia, unspecified: Secondary | ICD-10-CM | POA: Diagnosis not present

## 2019-08-02 DIAGNOSIS — J9601 Acute respiratory failure with hypoxia: Secondary | ICD-10-CM | POA: Diagnosis not present

## 2019-08-02 DIAGNOSIS — I5032 Chronic diastolic (congestive) heart failure: Secondary | ICD-10-CM

## 2019-08-02 DIAGNOSIS — G8929 Other chronic pain: Secondary | ICD-10-CM | POA: Diagnosis present

## 2019-08-02 DIAGNOSIS — Z87891 Personal history of nicotine dependence: Secondary | ICD-10-CM | POA: Diagnosis not present

## 2019-08-02 DIAGNOSIS — R1013 Epigastric pain: Secondary | ICD-10-CM | POA: Diagnosis not present

## 2019-08-02 DIAGNOSIS — E1122 Type 2 diabetes mellitus with diabetic chronic kidney disease: Secondary | ICD-10-CM | POA: Diagnosis present

## 2019-08-02 DIAGNOSIS — J9620 Acute and chronic respiratory failure, unspecified whether with hypoxia or hypercapnia: Secondary | ICD-10-CM | POA: Diagnosis present

## 2019-08-02 DIAGNOSIS — N1831 Chronic kidney disease, stage 3a: Secondary | ICD-10-CM | POA: Diagnosis not present

## 2019-08-02 DIAGNOSIS — R0602 Shortness of breath: Secondary | ICD-10-CM | POA: Diagnosis not present

## 2019-08-02 DIAGNOSIS — N183 Chronic kidney disease, stage 3 unspecified: Secondary | ICD-10-CM | POA: Diagnosis not present

## 2019-08-02 DIAGNOSIS — R0600 Dyspnea, unspecified: Secondary | ICD-10-CM | POA: Diagnosis not present

## 2019-08-02 DIAGNOSIS — F1210 Cannabis abuse, uncomplicated: Secondary | ICD-10-CM | POA: Diagnosis present

## 2019-08-02 LAB — BASIC METABOLIC PANEL
Anion gap: 8 (ref 5–15)
BUN: 10 mg/dL (ref 8–23)
CO2: 33 mmol/L — ABNORMAL HIGH (ref 22–32)
Calcium: 8.7 mg/dL — ABNORMAL LOW (ref 8.9–10.3)
Chloride: 100 mmol/L (ref 98–111)
Creatinine, Ser: 1.24 mg/dL (ref 0.61–1.24)
GFR calc Af Amer: >60 mL/min (ref >60)
GFR calc non Af Amer: >60 mL/min (ref >60)
Glucose, Bld: 114 mg/dL — ABNORMAL HIGH (ref 70–99)
Potassium: 4.6 mmol/L (ref 3.5–5.1)
Sodium: 141 mmol/L (ref 135–145)

## 2019-08-02 LAB — URINALYSIS, ROUTINE W REFLEX MICROSCOPIC
Bacteria, UA: NONE SEEN
Bilirubin Urine: NEGATIVE
Glucose, UA: NEGATIVE mg/dL
Hgb urine dipstick: NEGATIVE
Ketones, ur: NEGATIVE mg/dL
Leukocytes,Ua: NEGATIVE
Nitrite: NEGATIVE
Protein, ur: 100 mg/dL — AB
Specific Gravity, Urine: 1.013 (ref 1.005–1.030)
pH: 5 (ref 5.0–8.0)

## 2019-08-02 LAB — CBC
HCT: 42.6 % (ref 39.0–52.0)
Hemoglobin: 12.5 g/dL — ABNORMAL LOW (ref 13.0–17.0)
MCH: 25.8 pg — ABNORMAL LOW (ref 26.0–34.0)
MCHC: 29.3 g/dL — ABNORMAL LOW (ref 30.0–36.0)
MCV: 87.8 fL (ref 80.0–100.0)
Platelets: 284 10*3/uL (ref 150–400)
RBC: 4.85 MIL/uL (ref 4.22–5.81)
RDW: 17.5 % — ABNORMAL HIGH (ref 11.5–15.5)
WBC: 7.9 10*3/uL (ref 4.0–10.5)
nRBC: 0 % (ref 0.0–0.2)

## 2019-08-02 LAB — BRAIN NATRIURETIC PEPTIDE: B Natriuretic Peptide: 83 pg/mL (ref 0.0–100.0)

## 2019-08-02 LAB — TROPONIN I (HIGH SENSITIVITY)
Troponin I (High Sensitivity): 8 ng/L (ref <18)
Troponin I (High Sensitivity): 10 ng/L (ref <18)

## 2019-08-02 LAB — POC SARS CORONAVIRUS 2 AG -  ED: SARS Coronavirus 2 Ag: NEGATIVE

## 2019-08-02 MED ORDER — IOHEXOL 350 MG/ML SOLN
100.0000 mL | Freq: Once | INTRAVENOUS | Status: AC | PRN
  Administered 2019-08-02: 100 mL via INTRAVENOUS

## 2019-08-02 MED ORDER — ACETAMINOPHEN 325 MG PO TABS
650.0000 mg | ORAL_TABLET | Freq: Four times a day (QID) | ORAL | Status: DC | PRN
  Administered 2019-08-02 – 2019-08-05 (×4): 650 mg via ORAL
  Filled 2019-08-02 (×4): qty 2

## 2019-08-02 MED ORDER — LINACLOTIDE 72 MCG PO CAPS
72.0000 ug | ORAL_CAPSULE | Freq: Every day | ORAL | Status: DC
  Administered 2019-08-03 – 2019-08-05 (×3): 72 ug via ORAL
  Filled 2019-08-02 (×5): qty 1

## 2019-08-02 MED ORDER — IPRATROPIUM-ALBUTEROL 0.5-2.5 (3) MG/3ML IN SOLN
3.0000 mL | RESPIRATORY_TRACT | Status: DC | PRN
  Administered 2019-08-03: 3 mL via RESPIRATORY_TRACT
  Filled 2019-08-02: qty 3

## 2019-08-02 MED ORDER — ENOXAPARIN SODIUM 40 MG/0.4ML ~~LOC~~ SOLN
40.0000 mg | SUBCUTANEOUS | Status: DC
  Administered 2019-08-02 – 2019-08-04 (×3): 40 mg via SUBCUTANEOUS
  Filled 2019-08-02 (×3): qty 0.4

## 2019-08-02 MED ORDER — HYDROCODONE-ACETAMINOPHEN 5-325 MG PO TABS
1.0000 | ORAL_TABLET | Freq: Once | ORAL | Status: AC
  Administered 2019-08-02: 1 via ORAL
  Filled 2019-08-02: qty 1

## 2019-08-02 MED ORDER — FUROSEMIDE 10 MG/ML IJ SOLN
40.0000 mg | Freq: Once | INTRAMUSCULAR | Status: AC
  Administered 2019-08-02: 40 mg via INTRAVENOUS
  Filled 2019-08-02: qty 4

## 2019-08-02 MED ORDER — GABAPENTIN 300 MG PO CAPS
300.0000 mg | ORAL_CAPSULE | Freq: Two times a day (BID) | ORAL | Status: DC
  Administered 2019-08-02 – 2019-08-05 (×6): 300 mg via ORAL
  Filled 2019-08-02 (×6): qty 1

## 2019-08-02 MED ORDER — ACETAMINOPHEN 650 MG RE SUPP: 650.0000 mg | Freq: Four times a day (QID) | RECTAL | Status: DC | PRN

## 2019-08-02 MED ORDER — CALCIUM CARBONATE ANTACID 500 MG PO CHEW
1.0000 | CHEWABLE_TABLET | Freq: Three times a day (TID) | ORAL | Status: AC
  Administered 2019-08-02 – 2019-08-04 (×5): 200 mg via ORAL
  Filled 2019-08-02 (×5): qty 1

## 2019-08-02 MED ORDER — ONDANSETRON HCL 4 MG PO TABS: 4.0000 mg | ORAL_TABLET | Freq: Four times a day (QID) | ORAL | Status: DC | PRN

## 2019-08-02 MED ORDER — SODIUM CHLORIDE 0.9 % IV SOLN
1.0000 g | INTRAVENOUS | Status: DC
  Administered 2019-08-03 – 2019-08-04 (×2): 1 g via INTRAVENOUS
  Filled 2019-08-02 (×2): qty 10

## 2019-08-02 MED ORDER — POLYETHYLENE GLYCOL 3350 17 G PO PACK: 17.0000 g | PACK | Freq: Every day | ORAL | Status: DC | PRN

## 2019-08-02 MED ORDER — UMECLIDINIUM BROMIDE 62.5 MCG/INH IN AEPB
1.0000 | INHALATION_SPRAY | Freq: Every day | RESPIRATORY_TRACT | Status: DC
  Filled 2019-08-02: qty 7

## 2019-08-02 MED ORDER — FLUTICASONE-UMECLIDIN-VILANT 100-62.5-25 MCG/INH IN AEPB: 1.0000 | INHALATION_SPRAY | Freq: Every day | RESPIRATORY_TRACT | Status: DC

## 2019-08-02 MED ORDER — FLUTICASONE FUROATE-VILANTEROL 100-25 MCG/INH IN AEPB
1.0000 | INHALATION_SPRAY | Freq: Every day | RESPIRATORY_TRACT | Status: DC
  Filled 2019-08-02: qty 28

## 2019-08-02 MED ORDER — PANTOPRAZOLE SODIUM 40 MG IV SOLR
40.0000 mg | Freq: Two times a day (BID) | INTRAVENOUS | Status: DC
  Administered 2019-08-02 – 2019-08-05 (×6): 40 mg via INTRAVENOUS
  Filled 2019-08-02 (×6): qty 40

## 2019-08-02 MED ORDER — ONDANSETRON HCL 4 MG/2ML IJ SOLN: 4.0000 mg | Freq: Four times a day (QID) | INTRAMUSCULAR | Status: DC | PRN

## 2019-08-02 MED ORDER — SODIUM CHLORIDE 0.9 % IV SOLN
500.0000 mg | INTRAVENOUS | Status: DC
  Administered 2019-08-02 – 2019-08-03 (×2): 500 mg via INTRAVENOUS
  Filled 2019-08-02 (×2): qty 500

## 2019-08-02 MED ORDER — AMLODIPINE BESYLATE 5 MG PO TABS
2.5000 mg | ORAL_TABLET | Freq: Every day | ORAL | Status: DC
  Administered 2019-08-02: 2.5 mg via ORAL
  Filled 2019-08-02 (×3): qty 1

## 2019-08-02 NOTE — ED Notes (Signed)
Pt given meal tray.

## 2019-08-02 NOTE — H&P (Addendum)
History and Physical    Bryan [name] UUE:[id number] DOB: May 04, 1956 DOA: 08/02/2019  PCP: Rosita Fire, MD   Patient coming from: Home  I have personally briefly reviewed patient's old medical records in [place]  Chief Complaint: Home  HPI: Bryan [name] is a 63 y.o. male with medical history significant for COPD on 2-1/2 L at baseline, duodenal ulcer, diabetes mellitus, chronic kidney disease stage III, congestive heart failure who presented to the ED with complaints of difficulty breathing that started this morning.  He reports his oxygen saturation was 73% on his home 2-1/2 L of oxygen.  He reports associated wheezing, he denies cough.  He also reports chest tightness/pain and upper abdominal pain of several months duration.  He is unable to tell if this is worsening in severity.  No leg swelling.  He denies fever or chills.  Reports Protonix sometimes sometimes helps with his abdominal pain. Also reports intermittent black stools.  He denies NSAID use.  No vomiting of blood.  He tells me his baseline weight is about 121 pounds.  He does not restrict salt in his diet.  Recent hospitalization 10/1-10/2 for decompensated CHF, D-dimer was elevated at 388.  Diuresed with IV Lasix and discharged home on torsemide.  Tells me he has not been compliant with torsemide due to increased urination.  ED Course: Tachycardic to 110, tachypneic, initially placed on nonrebreather with sats of 100%.  On my evaluation he is 1/2 L of oxygen with sats greater than 90%.  Portable chest x-ray suggest mild pulmonary edema, enlarged pulmonary artery suggesting pulmonary artery hypertension.  IV Lasix 40 mg x 1 given in ED. hospitalist to admit for acute on chronic respiratory failure.  Review of Systems: As per HPI all other systems reviewed and negative.  Past Medical History:  Diagnosis Date  . Acid reflux   . Arthritis   . Asthma   . Chronic pain    with leg and back pain (disc problem)  . COPD  (chronic obstructive pulmonary disease) ([place])   . Diabetes mellitus without complication ([place])   . Headache    HX OF  . History of upper GI x-ray series    to follow showed large duodenal ulcer H pylori serologies were negative  . Hypertension   . Pneumonia    07/17/17  . Pre-diabetes   . Stroke [facility])    TIA MINI STROKE  . Tubular adenoma     Past Surgical History:  Procedure Laterality Date  . BACK SURGERY  7/05; 5/09    Dr.Hirsch,3 lumbar  X3  . BACK SURGERY    . COLONOSCOPY  Feb 2012   Dr. Gala Romney: normal rectum, pedunculate polyp removed but not recovered  . COLONOSCOPY WITH ESOPHAGOGASTRODUODENOSCOPY (EGD) N/A 11/18/2013   Dr.Rourk- tcs= normal rectum, multipe polyps about the ileocecal valve and distal transverse colon o/w the remainder of the colonic mucosa appeared normal bx= tubular adenoma. EGD= normal esophagus, stomach with scattered erosions mottling, friablility, no ulcer or infiltrating process patent pylorus bx= chronic inflammation. next TCS 11/2016  . COLONOSCOPY WITH PROPOFOL N/A 05/25/2017   Dr. Gala Romney: sigmoid diverticulosis, one 4 mm hyerplastic rectal polyp, ascending colonic AVMs surveillance 2023  . ESOPHAGOGASTRODUODENOSCOPY  1/06   Dr. Volney American esophageal erosions,U-shaped stomach,marked erosions and edema of the bulb without discrete ulcer disease.   . ESOPHAGOGASTRODUODENOSCOPY (EGD) WITH PROPOFOL N/A 05/25/2017   Dr. Gala Romney: reflux esophagitis s/p empiric dilation, normal stomach and duodenum  . MALONEY DILATION N/A 05/25/2017  Procedure: MALONEY DILATION;  Surgeon: Daneil Dolin, MD;  Location: [facility];  Service: Endoscopy;  Laterality: N/A;  . NECK SURGERY  10/2007   PLATE IN NECK  . POLYPECTOMY  05/25/2017   Procedure: POLYPECTOMY;  Surgeon: Daneil Dolin, MD;  Location: [facility];  Service: Endoscopy;;  rectal  . TRANSURETHRAL RESECTION OF PROSTATE N/A 08/17/2017   Procedure: TRANSURETHRAL RESECTION OF THE PROSTATE (TURP);  Surgeon: Irine Seal, MD;  Location: [facility] ORS;  Service: Urology;  Laterality: N/A;     reports that he quit smoking about 4 years ago. His smoking use included cigarettes. He has a 20.00 pack-year smoking history. He has never used smokeless tobacco. He reports current drug use. Drug: Marijuana. He reports that he does not drink alcohol.  No Known Allergies  Family History  Problem Relation Age of Onset  . Cancer Father   . Asthma Mother   . Colon cancer Neg Hx     Prior to Admission medications   Medication Sig Start Date End Date Taking? Authorizing Provider  acetaminophen (TYLENOL) 325 MG tablet Take 2 tablets (650 mg total) by mouth every 6 (six) hours as needed for mild pain (or Fever >/= 101). 06/14/19  Yes , Courage, MD  albuterol (PROVENTIL) (2.5 MG/3ML) 0.083% nebulizer solution Take 3 mLs (2.5 mg total) by nebulization every 4 (four) hours as needed for wheezing or shortness of breath. 06/14/19  Yes , Courage, MD  albuterol (VENTOLIN HFA) 108 (90 Base) MCG/ACT inhaler Inhale 2 puffs into the lungs every 4 (four) hours as needed for wheezing or shortness of breath. Do not use with nebulizer 06/14/19  Yes , Courage, MD  amLODipine (NORVASC) 2.5 MG tablet Take 1 tablet (2.5 mg total) by mouth daily. 06/14/19 06/13/20 Yes , Courage, MD  Fluticasone-Umeclidin-Vilant (TRELEGY ELLIPTA) 100-62.5-25 MCG/INH AEPB Inhale 1 puff into the lungs daily. 06/14/19  Yes , Courage, MD  gabapentin (NEURONTIN) 300 MG capsule Take 1 capsule (300 mg total) by mouth 2 (two) times daily. Pt. Says he is taking twice daily. Patient taking differently: Take 600 mg by mouth daily. Pt. Says he is taking twice daily. 06/14/19  Yes , Courage, MD  guaiFENesin (MUCINEX) 600 MG 12 hr tablet Take 1 tablet (600 mg total) by mouth 2 (two) times daily. 06/14/19 06/13/20 Yes Roxan Hockey, MD  linaclotide (LINZESS) 72 MCG capsule Take 1 capsule (72 mcg total) by mouth daily before breakfast. 06/14/19   Yes , Courage, MD  pantoprazole (PROTONIX) 40 MG tablet Take 1 tablet (40 mg total) by mouth daily before breakfast. 02/15/19  Yes [name], NP  polyethylene glycol powder (MIRALAX) 17 GM/SCOOP powder Take 17 g by mouth daily as needed (FOR CONSTIPATION).  11/14/18  Yes [provider]  OXYGEN Inhale 2.5 L into the lungs daily.    [provider]  potassium chloride (KLOR-CON) 10 MEQ tablet Take 1 tablet (10 mEq total) by mouth every Tuesday, Thursday, Saturday, and Sunday. Patient not taking: Reported on 08/02/2019 06/15/19   Roxan Hockey, MD  tamsulosin (FLOMAX) 0.4 MG CAPS capsule Take 1 capsule (0.4 mg total) by mouth daily after supper. Patient not taking: Reported on 08/02/2019 06/14/19   Roxan Hockey, MD    Physical Exam: Vitals:   08/02/19 1515 08/02/19 1545 08/02/19 1606 08/02/19 1607  BP:   108/66   Pulse: 100 100 (!) 107 (!) 105  Resp: (!) 26 (!) 21 (!) 32 (!) 26  Temp:  TempSrc:      SpO2: 92% 95% 90% 92%  Weight:      Height:        Constitutional: NAD, calm, comfortable Vitals:   08/02/19 1515 08/02/19 1545 08/02/19 1606 08/02/19 1607  BP:   108/66   Pulse: 100 100 (!) 107 (!) 105  Resp: (!) 26 (!) 21 (!) 32 (!) 26  Temp:      TempSrc:      SpO2: 92% 95% 90% 92%  Weight:      Height:       Eyes: PERRL, lids and conjunctivae normal ENMT: Mucous membranes are moist. Posterior pharynx clear of any exudate or lesions. Poor dentition.  Neck: normal, supple, no masses, no thyromegaly Respiratory: clear to auscultation bilaterally, no wheezing, no crackles. Normal respiratory effort. No accessory muscle use.  Cardiovascular: Regular rate and rhythm, no murmurs / rubs / gallops. No extremity edema. 2+ pedal pulses. No carotid bruits.  Abdomen: Marked epigastric tenderness , no masses palpated. No hepatosplenomegaly. Bowel sounds positive.  Musculoskeletal: no clubbing / cyanosis. No joint deformity upper and lower extremities. Good  ROM, no contractures. Normal muscle tone.  Skin: no rashes, lesions, ulcers. No induration Neurologic: CN 2-12 grossly intact. Strength 5/5 in all 4.  Psychiatric: Normal judgment and insight. Alert and oriented x 3. Normal mood.   Labs on Admission: I have personally reviewed following labs and imaging studies  CBC: Recent Labs  Lab 08/02/19 1150  WBC 7.9  HGB 12.5*  HCT 42.6  MCV 87.8  PLT 161   Basic Metabolic Panel: Recent Labs  Lab 08/02/19 1150  NA 141  K 4.6  CL 100  CO2 33*  GLUCOSE 114*  BUN 10  CREATININE 1.24  CALCIUM 8.7*   Urine analysis:    Component Value Date/Time   COLORURINE YELLOW 08/02/2019 [place] 08/02/2019 1253   LABSPEC 1.013 08/02/2019 1253   PHURINE 5.0 08/02/2019 [place] 08/02/2019 [place] 08/02/2019 [place] 08/02/2019 [place] 08/02/2019 1253   PROTEINUR 100 (A) 08/02/2019 1253   UROBILINOGEN 0.2 07/08/2015 1314   NITRITE NEGATIVE 08/02/2019 1253   LEUKOCYTESUR NEGATIVE 08/02/2019 1253    Radiological Exams on Admission: Dg Chest Portable 1 View  Result Date: 08/02/2019 CLINICAL DATA:  Chest pain, shortness of breath EXAM: PORTABLE CHEST 1 VIEW COMPARISON:  June 13 2019 FINDINGS: Interstitial prominence, which appears slightly increased. Background changes of COPD. No pleural effusion or pneumothorax. Normal heart size. Enlarged pulmonary arteries similar to prior. Partially imaged cervical spine fixation hardware. IMPRESSION: Increased interstitial prominence likely reflecting mild edema. Enlarged pulmonary arteries suggesting pulmonary arterial hypertension. Electronically Signed   By: Macy Mis M.D.   On: 08/02/2019 11:41    EKG: Independently reviewed.  Sinus tachycardia.  QTc 485.  Artifacts present.  Will repeat.  Assessment/Plan Active Problems:   Acute on chronic respiratory failure (HCC)   Acute on chronic respiratory failure-O2  sats down to 73% on home 2 1/2 L, initially on nonrebreather currently on  41/2 L nasal cannula, with sats > 90%.  With tachypnea and tachycardia to 110, and chest pain.  Chest x-ray suggest mild pulmonary edema.  No sign of peripheral volume overload. Degree of pulmonary edema does not correlate with his hypoxia.  Possible contribution of COPD exacerbation, but wheezing rhonchi not appreciated.  Differentials also include pulmonary artery hypertension as suggested on chest x-ray, with echo 06/13/2019 showing  severe pulmonary HTN, PASP 15mhg. - Supplemental O2, respiratory therapy consult - with tachycardia, obtain CT chest -DuoNebs as needed -Addendum-CTA negative for PE, shows severe centrilobular emphysema, possible consolidation.  Will start antibiotics with IV ceftriaxone and azithromycin.  Recent hospitalization for 1 day does not meet criteria for HCAP.  Decompensated CHF-stable and compensated.  Chest x-ray suggesting mild pulmonary edema. BNP WNL 83, improved compared to prior.  Last echo 06/2019 EF 60 to 65%, with severe pulmonary artery hypertension- 763mg. Supposed to be on torsemide 20 mg daily, but he has not been compliant with his medication or salt restricted diet.  Per charts weight stable, no evidence of peripheral volume overload at this time.  Hs troponin 8 > 10.  EKG without concerning abnormalities but with artifacts. -IV Lasix 40x1 given in ED -With contrast exposure, and without significant signs of volume overload, will hold further diuretic doses for now, consider resuming home torsemide soon -Repeat EKG - Strict input- output -Daily weights  Abdominal pain- epigastric.  Reports intermittent melena.  Stable hemoglobin 12.5, baseline about 13.  History of duodenal ulcer.  EGD 2018 done for epigastric pain showed reflux esophagitis but was otherwise normal. -CT abdomen with contrast -IV Protonix 40 twice daily -Calcium carbonate as needed -CBC a.m.  COPD-stable. -As  needed duo nebs - resume home bronchodilators  History of duodenal ulcer-hemoglobin stable 12.5. EGD 2018 done for epigastric pain showed reflux esophagitis but was otherwise normal. -IV Protonix, ca carbonate  CKD 3-creatinine today 1.24, baseline about 1.5.  Discharged home on torsemide in October, but not currently listed on home med list. -Hold further diuretic dose for now with contrast exposure consider resuming home p.o. medications  Diabetes mellitus-random glucose 114. HgbA1c 10/2- 7.  Diet controlled. -Daily CBGs - Resume home gabapentin  HTN- Stable - resume home Norvasc.   DVT prophylaxis: Lovenox Code Status: Full Family Communication: none at bedside Disposition Plan: per rounding team Consults called: None Admission status: Obs, tele    EjBethena RoysD [facility]  08/02/2019, 7:39 PM

## 2019-08-02 NOTE — ED Triage Notes (Signed)
Pt brought in by [facility] with c/o mid chest pain, epigastric pain and SOB. EMS reports O2 sat was 73% on 2L of O2 via [place] at home. Pt on NRB at this time, O2 sat 100%. Pt denies fever, cough. Pt has been seeing GI recently for this issue with unknown cause per pt.

## 2019-08-02 NOTE — ED Notes (Signed)
Pt in CT.

## 2019-08-03 DIAGNOSIS — F1210 Cannabis abuse, uncomplicated: Secondary | ICD-10-CM | POA: Diagnosis present

## 2019-08-03 DIAGNOSIS — I130 Hypertensive heart and chronic kidney disease with heart failure and stage 1 through stage 4 chronic kidney disease, or unspecified chronic kidney disease: Secondary | ICD-10-CM | POA: Diagnosis present

## 2019-08-03 DIAGNOSIS — I5032 Chronic diastolic (congestive) heart failure: Secondary | ICD-10-CM | POA: Diagnosis not present

## 2019-08-03 DIAGNOSIS — K589 Irritable bowel syndrome without diarrhea: Secondary | ICD-10-CM | POA: Diagnosis present

## 2019-08-03 DIAGNOSIS — Z9981 Dependence on supplemental oxygen: Secondary | ICD-10-CM | POA: Diagnosis not present

## 2019-08-03 DIAGNOSIS — R000 Tachycardia, unspecified: Secondary | ICD-10-CM | POA: Diagnosis present

## 2019-08-03 DIAGNOSIS — R519 Headache, unspecified: Secondary | ICD-10-CM | POA: Diagnosis present

## 2019-08-03 DIAGNOSIS — J9621 Acute and chronic respiratory failure with hypoxia: Secondary | ICD-10-CM | POA: Diagnosis not present

## 2019-08-03 DIAGNOSIS — J440 Chronic obstructive pulmonary disease with acute lower respiratory infection: Secondary | ICD-10-CM | POA: Diagnosis present

## 2019-08-03 DIAGNOSIS — Z79899 Other long term (current) drug therapy: Secondary | ICD-10-CM | POA: Diagnosis not present

## 2019-08-03 DIAGNOSIS — K2100 Gastro-esophageal reflux disease with esophagitis, without bleeding: Secondary | ICD-10-CM | POA: Diagnosis present

## 2019-08-03 DIAGNOSIS — N183 Chronic kidney disease, stage 3 unspecified: Secondary | ICD-10-CM | POA: Diagnosis not present

## 2019-08-03 DIAGNOSIS — N1831 Chronic kidney disease, stage 3a: Secondary | ICD-10-CM

## 2019-08-03 DIAGNOSIS — G8929 Other chronic pain: Secondary | ICD-10-CM | POA: Diagnosis present

## 2019-08-03 DIAGNOSIS — Z8711 Personal history of peptic ulcer disease: Secondary | ICD-10-CM | POA: Diagnosis not present

## 2019-08-03 DIAGNOSIS — J441 Chronic obstructive pulmonary disease with (acute) exacerbation: Principal | ICD-10-CM

## 2019-08-03 DIAGNOSIS — Z825 Family history of asthma and other chronic lower respiratory diseases: Secondary | ICD-10-CM | POA: Diagnosis not present

## 2019-08-03 DIAGNOSIS — I2721 Secondary pulmonary arterial hypertension: Secondary | ICD-10-CM | POA: Diagnosis present

## 2019-08-03 DIAGNOSIS — K921 Melena: Secondary | ICD-10-CM | POA: Diagnosis present

## 2019-08-03 DIAGNOSIS — Z8673 Personal history of transient ischemic attack (TIA), and cerebral infarction without residual deficits: Secondary | ICD-10-CM | POA: Diagnosis not present

## 2019-08-03 DIAGNOSIS — E1122 Type 2 diabetes mellitus with diabetic chronic kidney disease: Secondary | ICD-10-CM | POA: Diagnosis present

## 2019-08-03 DIAGNOSIS — Z20828 Contact with and (suspected) exposure to other viral communicable diseases: Secondary | ICD-10-CM | POA: Diagnosis present

## 2019-08-03 DIAGNOSIS — Z87891 Personal history of nicotine dependence: Secondary | ICD-10-CM | POA: Diagnosis not present

## 2019-08-03 DIAGNOSIS — J189 Pneumonia, unspecified organism: Secondary | ICD-10-CM | POA: Diagnosis present

## 2019-08-03 DIAGNOSIS — N1832 Chronic kidney disease, stage 3b: Secondary | ICD-10-CM | POA: Diagnosis not present

## 2019-08-03 LAB — RAPID URINE DRUG SCREEN, HOSP PERFORMED
Amphetamines: NOT DETECTED
Barbiturates: NOT DETECTED
Benzodiazepines: NOT DETECTED
Cocaine: NOT DETECTED
Opiates: POSITIVE — AB
Tetrahydrocannabinol: POSITIVE — AB

## 2019-08-03 LAB — BASIC METABOLIC PANEL
Anion gap: 11 (ref 5–15)
BUN: 11 mg/dL (ref 8–23)
CO2: 32 mmol/L (ref 22–32)
Calcium: 8.9 mg/dL (ref 8.9–10.3)
Chloride: 97 mmol/L — ABNORMAL LOW (ref 98–111)
Creatinine, Ser: 1.31 mg/dL — ABNORMAL HIGH (ref 0.61–1.24)
GFR calc Af Amer: >60 mL/min (ref >60)
GFR calc non Af Amer: 58 mL/min — ABNORMAL LOW (ref >60)
Glucose, Bld: 76 mg/dL (ref 70–99)
Potassium: 4.4 mmol/L (ref 3.5–5.1)
Sodium: 140 mmol/L (ref 135–145)

## 2019-08-03 LAB — HEMOGLOBIN A1C
Hgb A1c MFr Bld: 6.8 % — ABNORMAL HIGH (ref 4.8–5.6)
Mean Plasma Glucose: 148.46 mg/dL

## 2019-08-03 LAB — CBC
HCT: 41.3 % (ref 39.0–52.0)
Hemoglobin: 11.6 g/dL — ABNORMAL LOW (ref 13.0–17.0)
MCH: 24.6 pg — ABNORMAL LOW (ref 26.0–34.0)
MCHC: 28.1 g/dL — ABNORMAL LOW (ref 30.0–36.0)
MCV: 87.5 fL (ref 80.0–100.0)
Platelets: 351 10*3/uL (ref 150–400)
RBC: 4.72 MIL/uL (ref 4.22–5.81)
RDW: 17.3 % — ABNORMAL HIGH (ref 11.5–15.5)
WBC: 6.9 10*3/uL (ref 4.0–10.5)
nRBC: 0 % (ref 0.0–0.2)

## 2019-08-03 LAB — PROCALCITONIN: Procalcitonin: <0.1 ng/mL

## 2019-08-03 LAB — GLUCOSE, CAPILLARY
Glucose-Capillary: 149 mg/dL — ABNORMAL HIGH (ref 70–99)
Glucose-Capillary: 126 mg/dL — ABNORMAL HIGH (ref 70–99)

## 2019-08-03 LAB — HIV ANTIBODY (ROUTINE TESTING W REFLEX): HIV Screen 4th Generation wRfx: NONREACTIVE

## 2019-08-03 MED ORDER — METHYLPREDNISOLONE SODIUM SUCC 125 MG IJ SOLR: 60.0000 mg | Freq: Two times a day (BID) | INTRAMUSCULAR | Status: DC

## 2019-08-03 MED ORDER — TORSEMIDE 20 MG PO TABS
20.0000 mg | ORAL_TABLET | Freq: Every day | ORAL | Status: DC
  Administered 2019-08-03 – 2019-08-04 (×2): 20 mg via ORAL
  Filled 2019-08-03 (×2): qty 1

## 2019-08-03 MED ORDER — IPRATROPIUM-ALBUTEROL 0.5-2.5 (3) MG/3ML IN SOLN
3.0000 mL | Freq: Four times a day (QID) | RESPIRATORY_TRACT | Status: DC
  Administered 2019-08-03 – 2019-08-04 (×6): 3 mL via RESPIRATORY_TRACT
  Filled 2019-08-03 (×6): qty 3

## 2019-08-03 MED ORDER — DIPHENHYDRAMINE HCL 50 MG/ML IJ SOLN
25.0000 mg | Freq: Once | INTRAMUSCULAR | Status: AC
  Administered 2019-08-03: 25 mg via INTRAVENOUS
  Filled 2019-08-03: qty 1

## 2019-08-03 MED ORDER — METHYLPREDNISOLONE SODIUM SUCC 125 MG IJ SOLR
60.0000 mg | Freq: Three times a day (TID) | INTRAMUSCULAR | Status: AC
  Administered 2019-08-03 – 2019-08-04 (×5): 60 mg via INTRAVENOUS
  Filled 2019-08-03 (×5): qty 2

## 2019-08-03 MED ORDER — BUDESONIDE 0.5 MG/2ML IN SUSP
0.5000 mg | Freq: Two times a day (BID) | RESPIRATORY_TRACT | Status: DC
  Administered 2019-08-03 – 2019-08-05 (×4): 0.5 mg via RESPIRATORY_TRACT
  Filled 2019-08-03 (×4): qty 2

## 2019-08-03 MED ORDER — IPRATROPIUM-ALBUTEROL 0.5-2.5 (3) MG/3ML IN SOLN: 3.0000 mL | Freq: Four times a day (QID) | RESPIRATORY_TRACT | Status: DC

## 2019-08-03 NOTE — Progress Notes (Signed)
Nurse called requesting neb for patient. Patient's request. Patient wears 2 to 3 liters oxygen at home. He was placed on 5 liters by nurse for low oxygen saturation 86. Patient given PRN duoneb nebulizer, breath sounds decreased no wheezes noted. Saturation still low changed to high flow nasal cannula 8 liters saturation slowly improving. 90 as of last check. Patient has hx of smoking and asthma. Pulmonary hypertension.

## 2019-08-03 NOTE — Progress Notes (Signed)
PROGRESS NOTE  Bryan [name] WYO:[id number] DOB: Jul 24, 1956 DOA: 08/02/2019 PCP: Rosita Fire, MD  Brief History102  63 year old male with a history of COPD, peptic ulcer disease, diabetes mellitus type 2, chronic abdominal pain, CKD stage III, diastolic CHF, TIA presenting with 1 day history of shortness of breath and nonproductive cough.  The patient is chronically on 2.5 L oxygen at home.  Apparently, the patient stated that he had an oxygen saturation of 73% on the morning of 08/02/2019 when he became short of breath.  The patient also complained of some chest discomfort that was a burning type sensation.  He denied any hemoptysis, headache, vomiting, diarrhea, hematochezia, melena.  He had also complained of some subjective fevers and chills. The patient is a difficult historian as he has numerous complaints and has had a tangential thought process.  Nevertheless, the patient states that he stopped smoking approximately 4 to 5 years ago.  He was recently discharged from the hospital after stay from 06/13/2019 to 10-20 for acute on chronic diastolic CHF.  He was discharged with torsemide 20 mg daily.  However, he states he quit taking it 4 days ago because he felt that it made his stomach hurt.  Notably, the patient has chronic abdominal pain and has had previous extensive work-up.  His pain has been attributable in part due to musculoskeletal, constipation, and possibly a degree of IBS. In the emergency department, the patient temperature of 99.2 F with mild tachycardia in the 100s.  He was hemodynamically stable with oxygen saturation 93-95% on 6 L.  CT angiogram chest was negative for pulmonary embolus but showed trace dependent bilateral pleural effusions, centrilobular emphysema with diffuse bronchial wall thickening, patchy consolidation in the left lower lobe.  CT of the abdomen pelvis was negative for any acute findings.  The patient was started on azithromycin and ceftriaxone.  He was  given furosemide 40 mg IV x1.  Assessment/Plan: Acute on chronic respiratory failure with hypoxia -Secondary to COPD exacerbation and possible pneumonia -Currently stable on 5 L nasal cannula -Wean oxygen back to baseline 2.5 L as tolerated  COPD exacerbation -Start IV Solu-Medrol -Start Pulmicort -Start duo nebs -viral respiratory panel -[id number]--MVE  Chronic diastolic CHF -The patient appears clinically euvolemic -Restart home dose torsemide if he is able to tolerate -Daily weights  Pulmonary infiltrates -Check procalcitonin -Continue ceftriaxone and azithromycin -Urine Legionella antigen -Urine Streptococcus pneumonia antigen  THC abuse -Urine drug screen  CKD stage III -Baseline creatinine 1.3-1.5 -A.m. BMP  Chronic abdominal pain -The patient follows [facility] -He has had an extensive previous work-up including EGD, colonoscopy, and numerous CT abdomen/pelvis -Has been attributable to multifactorial etiology including constipation, esophagitis, musculoskeletal, and a degree of IBS  Essential Hypertension -restart amlodipine  Atypical chest pain -personally reviewed EKG-sinus, nonspecific STT changes -troponins neg -personally reviewed CXR--increased interstitial markings      Disposition Plan:   Home in 2-3 days  Family Communication:  No Family at bedside  Consultants:  none  Code Status:  FULL  DVT Prophylaxis:   [place] Lovenox   Procedures: As Listed in Progress Note Above  Antibiotics: Ceftriaxone 11/20>>> azithro 11/20>>>   Subjective: Patient denies fevers, chills, headache, chest pain, dyspnea, nausea, vomiting, diarrhea, abdominal pain, dysuria, hematuria, hematochezia, and melena.   Objective: Vitals:   08/02/19 2027 08/02/19 2353 08/03/19 0548 08/03/19 0551  BP:  105/65  121/77  Pulse:  94  96  Resp:  (!) 24  18  Temp:  98.1 F (36.7 C)  98.3 F (36.8 C)  TempSrc:  Oral  Oral  SpO2: 90% 96% 90% 93%  Weight:  54.5 kg   54.1 kg  Height:  _0  (1.6 m)      Intake/Output Summary (Last 24 hours) at 08/03/2019 0956 Last data filed at 08/03/2019 0651 Gross per 24 hour  Intake 350 ml  Output 1450 ml  Net -1100 ml   Weight change:  Exam:   General:  Pt is alert, follows commands appropriately, not in acute distress  HEENT: No icterus, No thrush, No neck mass, [place]/AT  Cardiovascular: RRR, S1/S2, no rubs, no gallops  Respiratory: bilateral scattered rales and exp wheeze  Abdomen: Soft/+BS, non tender, non distended, no guarding  Extremities: No edema, No lymphangitis, No petechiae, No rashes, no synovitis   Data Reviewed: I have personally reviewed following labs and imaging studies Basic Metabolic Panel: Recent Labs  Lab 08/02/19 1150 08/03/19 0726  NA 141 140  K 4.6 4.4  CL 100 97*  CO2 33* 32  GLUCOSE 114* 76  BUN 10 11  CREATININE 1.24 1.31*  CALCIUM 8.7* 8.9   Liver Function Tests: No results for input(s): AST, ALT, ALKPHOS, BILITOT, PROT, ALBUMIN in the last 168 hours. No results for input(s): LIPASE, AMYLASE in the last 168 hours. No results for input(s): AMMONIA in the last 168 hours. Coagulation Profile: No results for input(s): INR, PROTIME in the last 168 hours. CBC: Recent Labs  Lab 08/02/19 1150 08/03/19 0726  WBC 7.9 6.9  HGB 12.5* 11.6*  HCT 42.6 41.3  MCV 87.8 87.5  PLT 284 351   Cardiac Enzymes: No results for input(s): CKTOTAL, CKMB, CKMBINDEX, TROPONINI in the last 168 hours. BNP: Invalid input(s): POCBNP CBG: Recent Labs  Lab 08/03/19 0916  GLUCAP 149*   HbA1C: No results for input(s): HGBA1C in the last 72 hours. Urine analysis:    Component Value Date/Time   COLORURINE YELLOW 08/02/2019 [place] 08/02/2019 1253   LABSPEC 1.013 08/02/2019 1253   PHURINE 5.0 08/02/2019 1253   GLUCOSEU NEGATIVE 08/02/2019 1253   HGBUR NEGATIVE 08/02/2019 [place] 08/02/2019 1253   KETONESUR NEGATIVE 08/02/2019 1253    PROTEINUR 100 (A) 08/02/2019 1253   UROBILINOGEN 0.2 07/08/2015 1314   NITRITE NEGATIVE 08/02/2019 1253   LEUKOCYTESUR NEGATIVE 08/02/2019 1253   Sepsis Labs: _1 (procalcitonin:4,lacticidven:4) )No results found for this or any previous visit (from the past 240 hour(s)).   Scheduled Meds: . amLODipine  2.5 mg Oral Daily  . budesonide (PULMICORT) nebulizer solution  0.5 mg Nebulization BID  . calcium carbonate  1 tablet Oral TID  . enoxaparin (LOVENOX) injection  40 mg Subcutaneous Q24H  . gabapentin  300 mg Oral BID  . ipratropium-albuterol  3 mL Nebulization Q6H  . linaclotide  72 mcg Oral QAC breakfast  . pantoprazole (PROTONIX) IV  40 mg Intravenous Q12H  . torsemide  20 mg Oral Daily  . umeclidinium bromide  1 puff Inhalation Daily   Continuous Infusions: . azithromycin 500 mg (08/02/19 2320)  . cefTRIAXone (ROCEPHIN)  IV 1 g (08/03/19 0110)    Procedures/Studies: Ct Angio Chest Pe W Or Wo Contrast  Result Date: 08/02/2019 CLINICAL DATA:  Chest pain. Dyspnea. Hypoxia. Upper abdominal pain for 3 months intermittently. EXAM: CT ANGIOGRAPHY CHEST CT ABDOMEN AND PELVIS WITH CONTRAST TECHNIQUE: Multidetector CT imaging of the chest was performed using the standard protocol during bolus administration of intravenous contrast. Multiplanar  CT image reconstructions and MIPs were obtained to evaluate the vascular anatomy. Multidetector CT imaging of the abdomen and pelvis was performed using the standard protocol during bolus administration of intravenous contrast. CONTRAST:  143m OMNIPAQUE IOHEXOL 350 MG/ML SOLN COMPARISON:  09/16/2018 chest CT angiogram. 06/09/2019 CT abdomen/pelvis. FINDINGS: CTA CHEST FINDINGS Cardiovascular: The study is high quality for the evaluation of pulmonary embolism. There are no filling defects in the central, lobar, segmental or subsegmental pulmonary artery branches to suggest acute pulmonary embolism. Minimally atherosclerotic nonaneurysmal thoracic  aorta. Top-normal main pulmonary artery (3.0 cm diameter). Normal heart size. No significant pericardial fluid/thickening. Left anterior descending coronary atherosclerosis. Mediastinum/Nodes: No discrete thyroid nodules. Unremarkable esophagus. No axillary adenopathy. Mildly enlarged 1.0 cm subcarinal node (series 2/image 50), new. Newly mildly enlarged AP window nodes up to the 1.1 cm (series 2/image 30). New bilateral hilar adenopathy up to 1.0 cm on the right (series 2/image 57) and 1.1 cm on the left (series 2/image 47). Lungs/Pleura: No pneumothorax. Trace dependent bilateral pleural effusions, left greater than right. Severe centrilobular emphysema with mild diffuse bronchial wall thickening. Saber sheath trachea. No lung masses or significant pulmonary nodules. Mild patchy consolidation in the dependent superior segment left lower lobe. Scattered curvilinear opacities throughout the dependent lungs bilaterally. Musculoskeletal: No aggressive appearing focal osseous lesions. Partially visualized surgical hardware from ACDF. Review of the MIP images confirms the above findings. CT ABDOMEN AND PELVIS FINDINGS Hepatobiliary: Normal liver with no liver mass. Normal gallbladder with no radiopaque cholelithiasis. No biliary ductal dilatation. Pancreas: Normal, with no mass or duct dilation. Spleen: Normal size. No mass. Adrenals/Urinary Tract: Stable adrenals without discrete adrenal nodules. Normal kidneys with no hydronephrosis and no renal mass. Normal bladder. Stomach/Bowel: Normal non-distended stomach. Normal caliber small bowel with no small bowel wall thickening. Normal appendix. Normal large bowel with no diverticulosis, large bowel wall thickening or pericolonic fat stranding. Vascular/Lymphatic: Atherosclerotic nonaneurysmal abdominal aorta. Patent portal, splenic, hepatic and renal veins. No pathologically enlarged lymph nodes in the abdomen or pelvis. Reproductive: Mildly enlarged prostate. Other: No  pneumoperitoneum, ascites or focal fluid collection. Musculoskeletal: No aggressive appearing focal osseous lesions. Marked lumbar spondylosis. Status post posterior spinal fusion on the left at L3-4. IMPRESSION: 1. No pulmonary embolism. 2. Severe centrilobular emphysema with mild diffuse bronchial wall thickening and saber sheath trachea, suggesting COPD. 3. Mild patchy consolidation in the dependent superior segment left lower lobe, cannot excluded pneumonia. Curvilinear opacities scattered throughout the dependent lungs, compatible with mild scarring versus atelectasis. 4. Trace dependent bilateral pleural effusions. 5. One vessel coronary atherosclerosis. 6. New mild bilateral mediastinal and bilateral hilar lymphadenopathy, nonspecific. 7. No acute abnormality in the abdomen or pelvis. No evidence of bowel obstruction or acute bowel inflammation. Normal appendix. 8. Mildly enlarged prostate. 9. Aortic Atherosclerosis (ICD10-I70.0) and Emphysema (ICD10-J43.9). Electronically Signed   By: JIlona SorrelM.D.   On: 08/02/2019 20:38   Ct Abdomen Pelvis W Contrast  Result Date: 08/02/2019 CLINICAL DATA:  Chest pain. Dyspnea. Hypoxia. Upper abdominal pain for 3 months intermittently. EXAM: CT ANGIOGRAPHY CHEST CT ABDOMEN AND PELVIS WITH CONTRAST TECHNIQUE: Multidetector CT imaging of the chest was performed using the standard protocol during bolus administration of intravenous contrast. Multiplanar CT image reconstructions and MIPs were obtained to evaluate the vascular anatomy. Multidetector CT imaging of the abdomen and pelvis was performed using the standard protocol during bolus administration of intravenous contrast. CONTRAST:  1036mOMNIPAQUE IOHEXOL 350 MG/ML SOLN COMPARISON:  09/16/2018 chest CT angiogram. 06/09/2019 CT abdomen/pelvis. FINDINGS: CTA CHEST  FINDINGS Cardiovascular: The study is high quality for the evaluation of pulmonary embolism. There are no filling defects in the central, lobar,  segmental or subsegmental pulmonary artery branches to suggest acute pulmonary embolism. Minimally atherosclerotic nonaneurysmal thoracic aorta. Top-normal main pulmonary artery (3.0 cm diameter). Normal heart size. No significant pericardial fluid/thickening. Left anterior descending coronary atherosclerosis. Mediastinum/Nodes: No discrete thyroid nodules. Unremarkable esophagus. No axillary adenopathy. Mildly enlarged 1.0 cm subcarinal node (series 2/image 50), new. Newly mildly enlarged AP window nodes up to the 1.1 cm (series 2/image 30). New bilateral hilar adenopathy up to 1.0 cm on the right (series 2/image 57) and 1.1 cm on the left (series 2/image 47). Lungs/Pleura: No pneumothorax. Trace dependent bilateral pleural effusions, left greater than right. Severe centrilobular emphysema with mild diffuse bronchial wall thickening. Saber sheath trachea. No lung masses or significant pulmonary nodules. Mild patchy consolidation in the dependent superior segment left lower lobe. Scattered curvilinear opacities throughout the dependent lungs bilaterally. Musculoskeletal: No aggressive appearing focal osseous lesions. Partially visualized surgical hardware from ACDF. Review of the MIP images confirms the above findings. CT ABDOMEN AND PELVIS FINDINGS Hepatobiliary: Normal liver with no liver mass. Normal gallbladder with no radiopaque cholelithiasis. No biliary ductal dilatation. Pancreas: Normal, with no mass or duct dilation. Spleen: Normal size. No mass. Adrenals/Urinary Tract: Stable adrenals without discrete adrenal nodules. Normal kidneys with no hydronephrosis and no renal mass. Normal bladder. Stomach/Bowel: Normal non-distended stomach. Normal caliber small bowel with no small bowel wall thickening. Normal appendix. Normal large bowel with no diverticulosis, large bowel wall thickening or pericolonic fat stranding. Vascular/Lymphatic: Atherosclerotic nonaneurysmal abdominal aorta. Patent portal, splenic,  hepatic and renal veins. No pathologically enlarged lymph nodes in the abdomen or pelvis. Reproductive: Mildly enlarged prostate. Other: No pneumoperitoneum, ascites or focal fluid collection. Musculoskeletal: No aggressive appearing focal osseous lesions. Marked lumbar spondylosis. Status post posterior spinal fusion on the left at L3-4. IMPRESSION: 1. No pulmonary embolism. 2. Severe centrilobular emphysema with mild diffuse bronchial wall thickening and saber sheath trachea, suggesting COPD. 3. Mild patchy consolidation in the dependent superior segment left lower lobe, cannot excluded pneumonia. Curvilinear opacities scattered throughout the dependent lungs, compatible with mild scarring versus atelectasis. 4. Trace dependent bilateral pleural effusions. 5. One vessel coronary atherosclerosis. 6. New mild bilateral mediastinal and bilateral hilar lymphadenopathy, nonspecific. 7. No acute abnormality in the abdomen or pelvis. No evidence of bowel obstruction or acute bowel inflammation. Normal appendix. 8. Mildly enlarged prostate. 9. Aortic Atherosclerosis (ICD10-I70.0) and Emphysema (ICD10-J43.9). Electronically Signed   By: Ilona Sorrel M.D.   On: 08/02/2019 20:38   Dg Chest Portable 1 View  Result Date: 08/02/2019 CLINICAL DATA:  Chest pain, shortness of breath EXAM: PORTABLE CHEST 1 VIEW COMPARISON:  June 13 2019 FINDINGS: Interstitial prominence, which appears slightly increased. Background changes of COPD. No pleural effusion or pneumothorax. Normal heart size. Enlarged pulmonary arteries similar to prior. Partially imaged cervical spine fixation hardware. IMPRESSION: Increased interstitial prominence likely reflecting mild edema. Enlarged pulmonary arteries suggesting pulmonary arterial hypertension. Electronically Signed   By: Macy Mis M.D.   On: 08/02/2019 11:41    Orson Eva, DO  [facility] Pager [phone]  If 7PM-7AM, please contact night-coverage [email or web address] Password  [email or web address] 08/03/2019, 9:56 AM   LOS: 0 days

## 2019-08-04 DIAGNOSIS — N1832 Chronic kidney disease, stage 3b: Secondary | ICD-10-CM

## 2019-08-04 LAB — RESPIRATORY PANEL BY PCR

## 2019-08-04 LAB — BASIC METABOLIC PANEL
Anion gap: 11 (ref 5–15)
BUN: 20 mg/dL (ref 8–23)
CO2: 32 mmol/L (ref 22–32)
Calcium: 9.3 mg/dL (ref 8.9–10.3)
Chloride: 96 mmol/L — ABNORMAL LOW (ref 98–111)
Creatinine, Ser: 1.53 mg/dL — ABNORMAL HIGH (ref 0.61–1.24)
GFR calc Af Amer: 55 mL/min — ABNORMAL LOW (ref >60)
GFR calc non Af Amer: 48 mL/min — ABNORMAL LOW (ref >60)
Glucose, Bld: 129 mg/dL — ABNORMAL HIGH (ref 70–99)
Potassium: 4.5 mmol/L (ref 3.5–5.1)
Sodium: 139 mmol/L (ref 135–145)

## 2019-08-04 LAB — CBC
HCT: 42.1 % (ref 39.0–52.0)
Hemoglobin: 12.5 g/dL — ABNORMAL LOW (ref 13.0–17.0)
MCH: 24.7 pg — ABNORMAL LOW (ref 26.0–34.0)
MCHC: 29.7 g/dL — ABNORMAL LOW (ref 30.0–36.0)
MCV: 83.2 fL (ref 80.0–100.0)
Platelets: 411 10*3/uL — ABNORMAL HIGH (ref 150–400)
RBC: 5.06 MIL/uL (ref 4.22–5.81)
RDW: 16.6 % — ABNORMAL HIGH (ref 11.5–15.5)
WBC: 9.7 10*3/uL (ref 4.0–10.5)
nRBC: 0 % (ref 0.0–0.2)

## 2019-08-04 LAB — GLUCOSE, CAPILLARY
Glucose-Capillary: 129 mg/dL — ABNORMAL HIGH (ref 70–99)
Glucose-Capillary: 143 mg/dL — ABNORMAL HIGH (ref 70–99)

## 2019-08-04 LAB — LEGIONELLA PNEUMOPHILA SEROGP 1 UR AG: L. pneumophila Serogp 1 Ur Ag: NEGATIVE

## 2019-08-04 MED ORDER — AZITHROMYCIN 250 MG PO TABS
500.0000 mg | ORAL_TABLET | ORAL | Status: DC
  Administered 2019-08-04: 500 mg via ORAL
  Filled 2019-08-04: qty 2

## 2019-08-04 MED ORDER — PREDNISONE 20 MG PO TABS
60.0000 mg | ORAL_TABLET | Freq: Every day | ORAL | Status: DC
  Administered 2019-08-05: 60 mg via ORAL
  Filled 2019-08-04: qty 3

## 2019-08-04 MED ORDER — IPRATROPIUM-ALBUTEROL 0.5-2.5 (3) MG/3ML IN SOLN
3.0000 mL | Freq: Four times a day (QID) | RESPIRATORY_TRACT | Status: DC
  Administered 2019-08-05: 3 mL via RESPIRATORY_TRACT
  Filled 2019-08-04: qty 3

## 2019-08-04 NOTE — Progress Notes (Signed)
PROGRESS NOTE  Bryan [name] AJG:[id number] DOB: April 24, 1956 DOA: 08/02/2019 PCP: Rosita Fire, MD  Brief History81  63 year old male with a history of COPD, peptic ulcer disease, diabetes mellitus type 2, chronic abdominal pain, CKD stage III, diastolic CHF, TIA presenting with 1 day history of shortness of breath and nonproductive cough.  The patient is chronically on 2.5 L oxygen at home.  Apparently, the patient stated that he had an oxygen saturation of 73% on the morning of 08/02/2019 when he became short of breath.  The patient also complained of some chest discomfort that was a burning type sensation.  He denied any hemoptysis, headache, vomiting, diarrhea, hematochezia, melena.  He had also complained of some subjective fevers and chills. The patient is a difficult historian as he has numerous complaints and has had a tangential thought process.  Nevertheless, the patient states that he stopped smoking approximately 4 to 5 years ago.  He was recently discharged from the hospital after stay from 06/13/2019 to 10-20 for acute on chronic diastolic CHF.  He was discharged with torsemide 20 mg daily.  However, he states he quit taking it 4 days ago because he felt that it made his stomach hurt.  Notably, the patient has chronic abdominal pain and has had previous extensive work-up.  His pain has been attributable in part due to musculoskeletal, constipation, and possibly a degree of IBS. In the emergency department, the patient temperature of 99.2 F with mild tachycardia in the 100s.  He was hemodynamically stable with oxygen saturation 93-95% on 6 L.  CT angiogram chest was negative for pulmonary embolus but showed trace dependent bilateral pleural effusions, centrilobular emphysema with diffuse bronchial wall thickening, patchy consolidation in the left lower lobe.  CT of the abdomen pelvis was negative for any acute findings.  The patient was started on azithromycin and ceftriaxone.  He was  given furosemide 40 mg IV x1.  Assessment/Plan: Acute on chronic respiratory failure with hypoxia -Secondary to COPD exacerbation and possible pneumonia -Currently stable on 3.5 L nasal cannula -Wean oxygen back to baseline 2.5 L as tolerated  COPD exacerbation -Start IV Solu-Medrol -Continue Pulmicort -Continue duo nebs -viral respiratory panel--neg -BTDH-RCB6--LAG  Chronic diastolic CHF -The patient appears clinically euvolemic -Restart home dose torsemide if he is able to tolerate -Daily weights  Pulmonary infiltrates -Check procalcitonin <0.10 -discontinue ceftriaxone -Continue azithromycin -Urine Legionella antigen--neg -Urine Streptococcus pneumonia antigen  THC abuse -Urine drug screen--+opiates and THC  Diabetes Mellitus type 2, controlled -08/03/19 A1C--6.8 -not on any agents at home -CBGs remained controlled during hospitalization  CKD stage III -Baseline creatinine 1.3-1.5 -A.m. BMP  Chronic abdominal pain -The patient follows [facility] -He has had an extensive previous work-up including EGD, colonoscopy, and numerous CT abdomen/pelvis -Has been attributable to multifactorial etiology including constipation, esophagitis, musculoskeletal, and a degree of IBS  Essential Hypertension -discontinue amlodipine due to soft BPs  Atypical chest pain -personally reviewed EKG-sinus, nonspecific STT changes -troponins neg -personally reviewed CXR--increased interstitial markings      Disposition Plan:   Home 11/23 if stable Family Communication:  No Family at bedside  Consultants:  none  Code Status:  FULL  DVT Prophylaxis:   Marvin Lovenox   Procedures: As Listed in Progress Note Above  Antibiotics: Ceftriaxone 11/20>>> azithro 11/20>>>    Subjective: Pt states he is breathing better.  Denies cp, n/v/d, abd pain, f/c.  Objective: Vitals:   08/04/19 0545 08/04/19 0806 08/04/19  1351 08/04/19 1454  BP: (!) 109/97  103/70 113/61   Pulse: 91 99 89   Resp: _0 Temp: 98.5 F (36.9 C)  98.6 F (37 C)   TempSrc: Oral  Oral   SpO2: 94% 96% 98% 97%  Weight: 53.7 kg     Height:        Intake/Output Summary (Last 24 hours) at 08/04/2019 1530 Last data filed at 08/04/2019 1252 Gross per 24 hour  Intake 1345 ml  Output 650 ml  Net 695 ml   Weight change: -1.639 kg Exam:   General:  Pt is alert, follows commands appropriately, not in acute distress  HEENT: No icterus, No thrush, No neck mass, [place]/AT  Cardiovascular: RRR, S1/S2, no rubs, no gallops  Respiratory: bibasilar rales. No wheeze  Abdomen: Soft/+BS, non tender, non distended, no guarding  Extremities: No edema, No lymphangitis, No petechiae, No rashes, no synovitis   Data Reviewed: I have personally reviewed following labs and imaging studies Basic Metabolic Panel: Recent Labs  Lab 08/02/19 1150 08/03/19 0726 08/04/19 0655  NA 141 140 139  K 4.6 4.4 4.5  CL 100 97* 96*  CO2 33* 32 32  GLUCOSE 114* 76 129*  BUN _1 CREATININE 1.24 1.31* 1.53*  CALCIUM 8.7* 8.9 9.3   Liver Function Tests: No results for input(s): AST, ALT, ALKPHOS, BILITOT, PROT, ALBUMIN in the last 168 hours. No results for input(s): LIPASE, AMYLASE in the last 168 hours. No results for input(s): AMMONIA in the last 168 hours. Coagulation Profile: No results for input(s): INR, PROTIME in the last 168 hours. CBC: Recent Labs  Lab 08/02/19 1150 08/03/19 0726 08/04/19 0655  WBC 7.9 6.9 9.7  HGB 12.5* 11.6* 12.5*  HCT 42.6 41.3 42.1  MCV 87.8 87.5 83.2  PLT 284 351 411*   Cardiac Enzymes: No results for input(s): CKTOTAL, CKMB, CKMBINDEX, TROPONINI in the last 168 hours. BNP: Invalid input(s): POCBNP CBG: Recent Labs  Lab 08/03/19 0916 08/03/19 1618 08/04/19 0645 08/04/19 0725  GLUCAP 149* 126* 143* 129*   HbA1C: Recent Labs    08/03/19 1008  HGBA1C 6.8*   Urine analysis:    Component Value Date/Time   COLORURINE  YELLOW 08/02/2019 [place] 08/02/2019 1253   LABSPEC 1.013 08/02/2019 1253   PHURINE 5.0 08/02/2019 1253   GLUCOSEU NEGATIVE 08/02/2019 1253   HGBUR NEGATIVE 08/02/2019 [place] 08/02/2019 1253   KETONESUR NEGATIVE 08/02/2019 1253   PROTEINUR 100 (A) 08/02/2019 1253   UROBILINOGEN 0.2 07/08/2015 1314   NITRITE NEGATIVE 08/02/2019 1253   LEUKOCYTESUR NEGATIVE 08/02/2019 1253   Sepsis Labs: _2 (procalcitonin:4,lacticidven:4) ) Recent Results (from the past 240 hour(s))  Respiratory Panel by PCR     Status: None   Collection Time: 08/03/19 12:14 PM   Specimen: Nasopharyngeal Swab; Respiratory  Result Value Ref Range Status   Adenovirus NOT DETECTED NOT DETECTED Final   Coronavirus 229E NOT DETECTED NOT DETECTED Final    Comment: (NOTE) The Coronavirus on the Respiratory Panel, DOES NOT test for the novel  Coronavirus (2019 nCoV)    Coronavirus HKU1 NOT DETECTED NOT DETECTED Final   Coronavirus NL63 NOT DETECTED NOT DETECTED Final   Coronavirus OC43 NOT DETECTED NOT DETECTED Final   Metapneumovirus NOT DETECTED NOT DETECTED Final   Rhinovirus / Enterovirus NOT DETECTED NOT DETECTED Final   Influenza A NOT DETECTED NOT DETECTED Final   Influenza B NOT DETECTED NOT DETECTED Final   Parainfluenza  Virus 1 NOT DETECTED NOT DETECTED Final   Parainfluenza Virus 2 NOT DETECTED NOT DETECTED Final   Parainfluenza Virus 3 NOT DETECTED NOT DETECTED Final   Parainfluenza Virus 4 NOT DETECTED NOT DETECTED Final   Respiratory Syncytial Virus NOT DETECTED NOT DETECTED Final   Bordetella pertussis NOT DETECTED NOT DETECTED Final   Chlamydophila pneumoniae NOT DETECTED NOT DETECTED Final   Mycoplasma pneumoniae NOT DETECTED NOT DETECTED Final    Comment: Performed at [facility], [place] [address], [place], [place] [address]     Scheduled Meds: . amLODipine  2.5 mg Oral Daily  . budesonide (PULMICORT) nebulizer solution  0.5 mg Nebulization BID   . enoxaparin (LOVENOX) injection  40 mg Subcutaneous Q24H  . gabapentin  300 mg Oral BID  . ipratropium-albuterol  3 mL Nebulization Q6H  . linaclotide  72 mcg Oral QAC breakfast  . methylPREDNISolone (SOLU-MEDROL) injection  60 mg Intravenous Q8H  . pantoprazole (PROTONIX) IV  40 mg Intravenous Q12H  . torsemide  20 mg Oral Daily   Continuous Infusions: . azithromycin 500 mg (08/03/19 2237)  . cefTRIAXone (ROCEPHIN)  IV 1 g (08/04/19 0102)    Procedures/Studies: Ct Angio Chest Pe W Or Wo Contrast  Result Date: 08/02/2019 CLINICAL DATA:  Chest pain. Dyspnea. Hypoxia. Upper abdominal pain for 3 months intermittently. EXAM: CT ANGIOGRAPHY CHEST CT ABDOMEN AND PELVIS WITH CONTRAST TECHNIQUE: Multidetector CT imaging of the chest was performed using the standard protocol during bolus administration of intravenous contrast. Multiplanar CT image reconstructions and MIPs were obtained to evaluate the vascular anatomy. Multidetector CT imaging of the abdomen and pelvis was performed using the standard protocol during bolus administration of intravenous contrast. CONTRAST:  131m OMNIPAQUE IOHEXOL 350 MG/ML SOLN COMPARISON:  09/16/2018 chest CT angiogram. 06/09/2019 CT abdomen/pelvis. FINDINGS: CTA CHEST FINDINGS Cardiovascular: The study is high quality for the evaluation of pulmonary embolism. There are no filling defects in the central, lobar, segmental or subsegmental pulmonary artery branches to suggest acute pulmonary embolism. Minimally atherosclerotic nonaneurysmal thoracic aorta. Top-normal main pulmonary artery (3.0 cm diameter). Normal heart size. No significant pericardial fluid/thickening. Left anterior descending coronary atherosclerosis. Mediastinum/Nodes: No discrete thyroid nodules. Unremarkable esophagus. No axillary adenopathy. Mildly enlarged 1.0 cm subcarinal node (series 2/image 50), new. Newly mildly enlarged AP window nodes up to the 1.1 cm (series 2/image 30). New bilateral hilar  adenopathy up to 1.0 cm on the right (series 2/image 57) and 1.1 cm on the left (series 2/image 47). Lungs/Pleura: No pneumothorax. Trace dependent bilateral pleural effusions, left greater than right. Severe centrilobular emphysema with mild diffuse bronchial wall thickening. Saber sheath trachea. No lung masses or significant pulmonary nodules. Mild patchy consolidation in the dependent superior segment left lower lobe. Scattered curvilinear opacities throughout the dependent lungs bilaterally. Musculoskeletal: No aggressive appearing focal osseous lesions. Partially visualized surgical hardware from ACDF. Review of the MIP images confirms the above findings. CT ABDOMEN AND PELVIS FINDINGS Hepatobiliary: Normal liver with no liver mass. Normal gallbladder with no radiopaque cholelithiasis. No biliary ductal dilatation. Pancreas: Normal, with no mass or duct dilation. Spleen: Normal size. No mass. Adrenals/Urinary Tract: Stable adrenals without discrete adrenal nodules. Normal kidneys with no hydronephrosis and no renal mass. Normal bladder. Stomach/Bowel: Normal non-distended stomach. Normal caliber small bowel with no small bowel wall thickening. Normal appendix. Normal large bowel with no diverticulosis, large bowel wall thickening or pericolonic fat stranding. Vascular/Lymphatic: Atherosclerotic nonaneurysmal abdominal aorta. Patent portal, splenic, hepatic and renal veins. No pathologically enlarged lymph nodes in  the abdomen or pelvis. Reproductive: Mildly enlarged prostate. Other: No pneumoperitoneum, ascites or focal fluid collection. Musculoskeletal: No aggressive appearing focal osseous lesions. Marked lumbar spondylosis. Status post posterior spinal fusion on the left at L3-4. IMPRESSION: 1. No pulmonary embolism. 2. Severe centrilobular emphysema with mild diffuse bronchial wall thickening and saber sheath trachea, suggesting COPD. 3. Mild patchy consolidation in the dependent superior segment left  lower lobe, cannot excluded pneumonia. Curvilinear opacities scattered throughout the dependent lungs, compatible with mild scarring versus atelectasis. 4. Trace dependent bilateral pleural effusions. 5. One vessel coronary atherosclerosis. 6. New mild bilateral mediastinal and bilateral hilar lymphadenopathy, nonspecific. 7. No acute abnormality in the abdomen or pelvis. No evidence of bowel obstruction or acute bowel inflammation. Normal appendix. 8. Mildly enlarged prostate. 9. Aortic Atherosclerosis (ICD10-I70.0) and Emphysema (ICD10-J43.9). Electronically Signed   By: Ilona Sorrel M.D.   On: 08/02/2019 20:38   Ct Abdomen Pelvis W Contrast  Result Date: 08/02/2019 CLINICAL DATA:  Chest pain. Dyspnea. Hypoxia. Upper abdominal pain for 3 months intermittently. EXAM: CT ANGIOGRAPHY CHEST CT ABDOMEN AND PELVIS WITH CONTRAST TECHNIQUE: Multidetector CT imaging of the chest was performed using the standard protocol during bolus administration of intravenous contrast. Multiplanar CT image reconstructions and MIPs were obtained to evaluate the vascular anatomy. Multidetector CT imaging of the abdomen and pelvis was performed using the standard protocol during bolus administration of intravenous contrast. CONTRAST:  114m OMNIPAQUE IOHEXOL 350 MG/ML SOLN COMPARISON:  09/16/2018 chest CT angiogram. 06/09/2019 CT abdomen/pelvis. FINDINGS: CTA CHEST FINDINGS Cardiovascular: The study is high quality for the evaluation of pulmonary embolism. There are no filling defects in the central, lobar, segmental or subsegmental pulmonary artery branches to suggest acute pulmonary embolism. Minimally atherosclerotic nonaneurysmal thoracic aorta. Top-normal main pulmonary artery (3.0 cm diameter). Normal heart size. No significant pericardial fluid/thickening. Left anterior descending coronary atherosclerosis. Mediastinum/Nodes: No discrete thyroid nodules. Unremarkable esophagus. No axillary adenopathy. Mildly enlarged 1.0 cm  subcarinal node (series 2/image 50), new. Newly mildly enlarged AP window nodes up to the 1.1 cm (series 2/image 30). New bilateral hilar adenopathy up to 1.0 cm on the right (series 2/image 57) and 1.1 cm on the left (series 2/image 47). Lungs/Pleura: No pneumothorax. Trace dependent bilateral pleural effusions, left greater than right. Severe centrilobular emphysema with mild diffuse bronchial wall thickening. Saber sheath trachea. No lung masses or significant pulmonary nodules. Mild patchy consolidation in the dependent superior segment left lower lobe. Scattered curvilinear opacities throughout the dependent lungs bilaterally. Musculoskeletal: No aggressive appearing focal osseous lesions. Partially visualized surgical hardware from ACDF. Review of the MIP images confirms the above findings. CT ABDOMEN AND PELVIS FINDINGS Hepatobiliary: Normal liver with no liver mass. Normal gallbladder with no radiopaque cholelithiasis. No biliary ductal dilatation. Pancreas: Normal, with no mass or duct dilation. Spleen: Normal size. No mass. Adrenals/Urinary Tract: Stable adrenals without discrete adrenal nodules. Normal kidneys with no hydronephrosis and no renal mass. Normal bladder. Stomach/Bowel: Normal non-distended stomach. Normal caliber small bowel with no small bowel wall thickening. Normal appendix. Normal large bowel with no diverticulosis, large bowel wall thickening or pericolonic fat stranding. Vascular/Lymphatic: Atherosclerotic nonaneurysmal abdominal aorta. Patent portal, splenic, hepatic and renal veins. No pathologically enlarged lymph nodes in the abdomen or pelvis. Reproductive: Mildly enlarged prostate. Other: No pneumoperitoneum, ascites or focal fluid collection. Musculoskeletal: No aggressive appearing focal osseous lesions. Marked lumbar spondylosis. Status post posterior spinal fusion on the left at L3-4. IMPRESSION: 1. No pulmonary embolism. 2. Severe centrilobular emphysema with mild diffuse  bronchial wall thickening  and saber sheath trachea, suggesting COPD. 3. Mild patchy consolidation in the dependent superior segment left lower lobe, cannot excluded pneumonia. Curvilinear opacities scattered throughout the dependent lungs, compatible with mild scarring versus atelectasis. 4. Trace dependent bilateral pleural effusions. 5. One vessel coronary atherosclerosis. 6. New mild bilateral mediastinal and bilateral hilar lymphadenopathy, nonspecific. 7. No acute abnormality in the abdomen or pelvis. No evidence of bowel obstruction or acute bowel inflammation. Normal appendix. 8. Mildly enlarged prostate. 9. Aortic Atherosclerosis (ICD10-I70.0) and Emphysema (ICD10-J43.9). Electronically Signed   By: Ilona Sorrel M.D.   On: 08/02/2019 20:38   Dg Chest Portable 1 View  Result Date: 08/02/2019 CLINICAL DATA:  Chest pain, shortness of breath EXAM: PORTABLE CHEST 1 VIEW COMPARISON:  June 13 2019 FINDINGS: Interstitial prominence, which appears slightly increased. Background changes of COPD. No pleural effusion or pneumothorax. Normal heart size. Enlarged pulmonary arteries similar to prior. Partially imaged cervical spine fixation hardware. IMPRESSION: Increased interstitial prominence likely reflecting mild edema. Enlarged pulmonary arteries suggesting pulmonary arterial hypertension. Electronically Signed   By: Macy Mis M.D.   On: 08/02/2019 11:41    Orson Eva, DO  [facility] Pager [phone]  If 7PM-7AM, please contact night-coverage [email or web address] Password [email or web address] 08/04/2019, 3:30 PM   LOS: 1 day

## 2019-08-04 NOTE — Discharge Summary (Signed)
Physician Discharge Summary  Bryan [name]:[id number] DOB: 1955-12-03 DOA: 08/02/2019  PCP: Rosita Fire, MD  Admit date: 08/02/2019 Discharge date: 08/05/19  Admitted From: Home Disposition:  Home  Recommendations for Outpatient Follow-up:  1. Follow up with PCP in 1-2 weeks 2. Please obtain BMP/CBC in one week   Home Health: YES Equipment/Devices: Home oxygen  Discharge Condition: Stable CODE STATUS: FULL Diet recommendation: Heart Healthy / Carb Modified   Brief/Interim Summary: 63 year old male with a history of COPD, peptic ulcer disease, diabetes mellitus type 2, chronic abdominal pain, CKD stage III, diastolic CHF, TIA presenting with 1 day history of shortness of breath and nonproductive cough. The patient is chronically on 2.5 L oxygen at home. Apparently, the patient stated that he had an oxygen saturation of 73% on the morning of 08/02/2019 when he became short of breath. The patient also complained of some chest discomfort that was a burning type sensation. He denied any hemoptysis, headache, vomiting, diarrhea, hematochezia, melena. He had also complained of some subjective fevers and chills. The patient is a difficult historian as he has numerous complaints and has had a tangential thought process. Nevertheless, the patient states that he stopped smoking approximately 4 to 5 years ago. He was recently discharged from the hospital after stay from 06/13/2019 to 10-20 for acute on chronic diastolic CHF. He was discharged with torsemide 20 mg daily. However, he states he quit taking it 4 days ago because he felt that it made his stomach hurt. Notably, the patient has chronic abdominal pain and has had previous extensive work-up. His pain has been attributable in part due to musculoskeletal, constipation, and possibly a degree of IBS. In the emergency department, the patient temperature of 99.2 F with mild tachycardia in the 100s. He was hemodynamically stable  with oxygen saturation 93-95% on 6 L. CT angiogram chest was negative for pulmonary embolus but showed trace dependent bilateral pleural effusions, centrilobular emphysema with diffuse bronchial wall thickening, patchy consolidation in the left lower lobe. CT of the abdomen pelvis was negative for any acute findings. The patient was started on azithromycin and ceftriaxone. He was given furosemide 40 mg IV x1.  Discharge Diagnoses:   Acute on chronic respiratory failure with hypoxia -Secondary to COPD exacerbation and possible pneumonia -Currently stable on 3.5 L nasal cannula>>>2.5L -Wean oxygen back to baseline 2.5 L as tolerated  COPD exacerbation -Started IV Solu-Medrol>>>d/c home with prednisone taper -Continue Pulmicort -Continue duo nebs -viral respiratory panel--neg -[id number]--PYP  Chronic diastolic CHF -The patient appears clinically euvolemic -pt was able to tolerate torsemide during hospitalization -Daily weights  Pulmonary infiltrates -Check procalcitonin <0.10 -discontinue ceftriaxone -Continue azithromycin--2 more days after d/c -Urine Legionella antigen--neg -Urine Streptococcus pneumonia antigen--neg  THC abuse -Urine drug screen--+opiates and THC  Diabetes Mellitus type 2, controlled -08/03/19 A1C--6.8 -not on any agents at home -CBGs remained controlled during hospitalization  CKD stage III -Baseline creatinine 1.3-1.5 -A.m. BMP  Chronic abdominal pain -The patient follows [facility] -He has had an extensive previous work-up including EGD, colonoscopy, and numerous CT abdomen/pelvis -Has been attributable to multifactorial etiology including constipation, esophagitis, musculoskeletal, and a degree of IBS  Essential Hypertension -discontinue amlodipine due to soft BPs  Atypical chest pain -personally reviewed EKG-sinus, nonspecific STT changes -troponins neg -personally reviewed CXR--increased interstitial markings  Discharge  Instructions   Allergies as of 08/05/2019   No Known Allergies     Medication List    STOP taking these medications   amLODipine 2.5 MG tablet Commonly  known as: NORVASC   potassium chloride 10 MEQ tablet Commonly known as: KLOR-CON   tamsulosin 0.4 MG Caps capsule Commonly known as: FLOMAX     TAKE these medications   acetaminophen 325 MG tablet Commonly known as: TYLENOL Take 2 tablets (650 mg total) by mouth every 6 (six) hours as needed for mild pain (or Fever >/= 101).   albuterol (2.5 MG/3ML) 0.083% nebulizer solution Commonly known as: PROVENTIL Take 3 mLs (2.5 mg total) by nebulization every 4 (four) hours as needed for wheezing or shortness of breath.   albuterol 108 (90 Base) MCG/ACT inhaler Commonly known as: VENTOLIN HFA Inhale 2 puffs into the lungs every 4 (four) hours as needed for wheezing or shortness of breath. Do not use with nebulizer   azithromycin 500 MG tablet Commonly known as: ZITHROMAX Take 1 tablet (500 mg total) by mouth daily.   gabapentin 300 MG capsule Commonly known as: NEURONTIN Take 1 capsule (300 mg total) by mouth 2 (two) times daily. Pt. Says he is taking twice daily. What changed:   how much to take  when to take this   guaiFENesin 600 MG 12 hr tablet Commonly known as: Mucinex Take 1 tablet (600 mg total) by mouth 2 (two) times daily.   linaclotide 72 MCG capsule Commonly known as: Linzess Take 1 capsule (72 mcg total) by mouth daily before breakfast.   MiraLax 17 GM/SCOOP powder Generic drug: polyethylene glycol powder Take 17 g by mouth daily as needed (FOR CONSTIPATION).   OXYGEN Inhale 2.5 L into the lungs daily.   pantoprazole 40 MG tablet Commonly known as: PROTONIX Take 1 tablet (40 mg total) by mouth daily before breakfast.   predniSONE 20 MG tablet Commonly known as: DELTASONE Take 3 tablets (60 mg total) by mouth daily with breakfast. And decrease by one tablet daily Start taking on: August 06, 2019   Trelegy Ellipta 100-62.5-25 MCG/INH Aepb Generic drug: Fluticasone-Umeclidin-Vilant Inhale 1 puff into the lungs daily.       No Known Allergies  Consultations:  none   Procedures/Studies: Ct Angio Chest Pe W Or Wo Contrast  Result Date: 08/02/2019 CLINICAL DATA:  Chest pain. Dyspnea. Hypoxia. Upper abdominal pain for 3 months intermittently. EXAM: CT ANGIOGRAPHY CHEST CT ABDOMEN AND PELVIS WITH CONTRAST TECHNIQUE: Multidetector CT imaging of the chest was performed using the standard protocol during bolus administration of intravenous contrast. Multiplanar CT image reconstructions and MIPs were obtained to evaluate the vascular anatomy. Multidetector CT imaging of the abdomen and pelvis was performed using the standard protocol during bolus administration of intravenous contrast. CONTRAST:  131m OMNIPAQUE IOHEXOL 350 MG/ML SOLN COMPARISON:  09/16/2018 chest CT angiogram. 06/09/2019 CT abdomen/pelvis. FINDINGS: CTA CHEST FINDINGS Cardiovascular: The study is high quality for the evaluation of pulmonary embolism. There are no filling defects in the central, lobar, segmental or subsegmental pulmonary artery branches to suggest acute pulmonary embolism. Minimally atherosclerotic nonaneurysmal thoracic aorta. Top-normal main pulmonary artery (3.0 cm diameter). Normal heart size. No significant pericardial fluid/thickening. Left anterior descending coronary atherosclerosis. Mediastinum/Nodes: No discrete thyroid nodules. Unremarkable esophagus. No axillary adenopathy. Mildly enlarged 1.0 cm subcarinal node (series 2/image 50), new. Newly mildly enlarged AP window nodes up to the 1.1 cm (series 2/image 30). New bilateral hilar adenopathy up to 1.0 cm on the right (series 2/image 57) and 1.1 cm on the left (series 2/image 47). Lungs/Pleura: No pneumothorax. Trace dependent bilateral pleural effusions, left greater than right. Severe centrilobular emphysema with mild diffuse bronchial wall  thickening.  Saber sheath trachea. No lung masses or significant pulmonary nodules. Mild patchy consolidation in the dependent superior segment left lower lobe. Scattered curvilinear opacities throughout the dependent lungs bilaterally. Musculoskeletal: No aggressive appearing focal osseous lesions. Partially visualized surgical hardware from ACDF. Review of the MIP images confirms the above findings. CT ABDOMEN AND PELVIS FINDINGS Hepatobiliary: Normal liver with no liver mass. Normal gallbladder with no radiopaque cholelithiasis. No biliary ductal dilatation. Pancreas: Normal, with no mass or duct dilation. Spleen: Normal size. No mass. Adrenals/Urinary Tract: Stable adrenals without discrete adrenal nodules. Normal kidneys with no hydronephrosis and no renal mass. Normal bladder. Stomach/Bowel: Normal non-distended stomach. Normal caliber small bowel with no small bowel wall thickening. Normal appendix. Normal large bowel with no diverticulosis, large bowel wall thickening or pericolonic fat stranding. Vascular/Lymphatic: Atherosclerotic nonaneurysmal abdominal aorta. Patent portal, splenic, hepatic and renal veins. No pathologically enlarged lymph nodes in the abdomen or pelvis. Reproductive: Mildly enlarged prostate. Other: No pneumoperitoneum, ascites or focal fluid collection. Musculoskeletal: No aggressive appearing focal osseous lesions. Marked lumbar spondylosis. Status post posterior spinal fusion on the left at L3-4. IMPRESSION: 1. No pulmonary embolism. 2. Severe centrilobular emphysema with mild diffuse bronchial wall thickening and saber sheath trachea, suggesting COPD. 3. Mild patchy consolidation in the dependent superior segment left lower lobe, cannot excluded pneumonia. Curvilinear opacities scattered throughout the dependent lungs, compatible with mild scarring versus atelectasis. 4. Trace dependent bilateral pleural effusions. 5. One vessel coronary atherosclerosis. 6. New mild bilateral  mediastinal and bilateral hilar lymphadenopathy, nonspecific. 7. No acute abnormality in the abdomen or pelvis. No evidence of bowel obstruction or acute bowel inflammation. Normal appendix. 8. Mildly enlarged prostate. 9. Aortic Atherosclerosis (ICD10-I70.0) and Emphysema (ICD10-J43.9). Electronically Signed   By: Ilona Sorrel M.D.   On: 08/02/2019 20:38   Ct Abdomen Pelvis W Contrast  Result Date: 08/02/2019 CLINICAL DATA:  Chest pain. Dyspnea. Hypoxia. Upper abdominal pain for 3 months intermittently. EXAM: CT ANGIOGRAPHY CHEST CT ABDOMEN AND PELVIS WITH CONTRAST TECHNIQUE: Multidetector CT imaging of the chest was performed using the standard protocol during bolus administration of intravenous contrast. Multiplanar CT image reconstructions and MIPs were obtained to evaluate the vascular anatomy. Multidetector CT imaging of the abdomen and pelvis was performed using the standard protocol during bolus administration of intravenous contrast. CONTRAST:  153m OMNIPAQUE IOHEXOL 350 MG/ML SOLN COMPARISON:  09/16/2018 chest CT angiogram. 06/09/2019 CT abdomen/pelvis. FINDINGS: CTA CHEST FINDINGS Cardiovascular: The study is high quality for the evaluation of pulmonary embolism. There are no filling defects in the central, lobar, segmental or subsegmental pulmonary artery branches to suggest acute pulmonary embolism. Minimally atherosclerotic nonaneurysmal thoracic aorta. Top-normal main pulmonary artery (3.0 cm diameter). Normal heart size. No significant pericardial fluid/thickening. Left anterior descending coronary atherosclerosis. Mediastinum/Nodes: No discrete thyroid nodules. Unremarkable esophagus. No axillary adenopathy. Mildly enlarged 1.0 cm subcarinal node (series 2/image 50), new. Newly mildly enlarged AP window nodes up to the 1.1 cm (series 2/image 30). New bilateral hilar adenopathy up to 1.0 cm on the right (series 2/image 57) and 1.1 cm on the left (series 2/image 47). Lungs/Pleura: No  pneumothorax. Trace dependent bilateral pleural effusions, left greater than right. Severe centrilobular emphysema with mild diffuse bronchial wall thickening. Saber sheath trachea. No lung masses or significant pulmonary nodules. Mild patchy consolidation in the dependent superior segment left lower lobe. Scattered curvilinear opacities throughout the dependent lungs bilaterally. Musculoskeletal: No aggressive appearing focal osseous lesions. Partially visualized surgical hardware from ACDF. Review of the MIP images confirms the above findings. CT  ABDOMEN AND PELVIS FINDINGS Hepatobiliary: Normal liver with no liver mass. Normal gallbladder with no radiopaque cholelithiasis. No biliary ductal dilatation. Pancreas: Normal, with no mass or duct dilation. Spleen: Normal size. No mass. Adrenals/Urinary Tract: Stable adrenals without discrete adrenal nodules. Normal kidneys with no hydronephrosis and no renal mass. Normal bladder. Stomach/Bowel: Normal non-distended stomach. Normal caliber small bowel with no small bowel wall thickening. Normal appendix. Normal large bowel with no diverticulosis, large bowel wall thickening or pericolonic fat stranding. Vascular/Lymphatic: Atherosclerotic nonaneurysmal abdominal aorta. Patent portal, splenic, hepatic and renal veins. No pathologically enlarged lymph nodes in the abdomen or pelvis. Reproductive: Mildly enlarged prostate. Other: No pneumoperitoneum, ascites or focal fluid collection. Musculoskeletal: No aggressive appearing focal osseous lesions. Marked lumbar spondylosis. Status post posterior spinal fusion on the left at L3-4. IMPRESSION: 1. No pulmonary embolism. 2. Severe centrilobular emphysema with mild diffuse bronchial wall thickening and saber sheath trachea, suggesting COPD. 3. Mild patchy consolidation in the dependent superior segment left lower lobe, cannot excluded pneumonia. Curvilinear opacities scattered throughout the dependent lungs, compatible with  mild scarring versus atelectasis. 4. Trace dependent bilateral pleural effusions. 5. One vessel coronary atherosclerosis. 6. New mild bilateral mediastinal and bilateral hilar lymphadenopathy, nonspecific. 7. No acute abnormality in the abdomen or pelvis. No evidence of bowel obstruction or acute bowel inflammation. Normal appendix. 8. Mildly enlarged prostate. 9. Aortic Atherosclerosis (ICD10-I70.0) and Emphysema (ICD10-J43.9). Electronically Signed   By: Ilona Sorrel M.D.   On: 08/02/2019 20:38   Dg Chest Portable 1 View  Result Date: 08/02/2019 CLINICAL DATA:  Chest pain, shortness of breath EXAM: PORTABLE CHEST 1 VIEW COMPARISON:  June 13 2019 FINDINGS: Interstitial prominence, which appears slightly increased. Background changes of COPD. No pleural effusion or pneumothorax. Normal heart size. Enlarged pulmonary arteries similar to prior. Partially imaged cervical spine fixation hardware. IMPRESSION: Increased interstitial prominence likely reflecting mild edema. Enlarged pulmonary arteries suggesting pulmonary arterial hypertension. Electronically Signed   By: Macy Mis M.D.   On: 08/02/2019 11:41        Discharge Exam: Vitals:   08/05/19 0414 08/05/19 0743  BP: (!) 103/2   Pulse: 88   Resp: 14   Temp: 98.8 F (37.1 C)   SpO2: 96% 97%   Vitals:   08/04/19 1948 08/04/19 2120 08/05/19 0414 08/05/19 0743  BP:  108/71 (!) 103/2   Pulse: 83 87 88   Resp: _0 Temp:  98 F (36.7 C) 98.8 F (37.1 C)   TempSrc:  Oral    SpO2: 98% 96% 96% 97%  Weight:   53.7 kg   Height:        General: Pt is alert, awake, not in acute distress Cardiovascular: RRR, S1/S2 +, no rubs, no gallops Respiratory: CTA bilaterally, no wheezing, no rhonchi Abdominal: Soft, NT, ND, bowel sounds + Extremities: no edema, no cyanosis   The results of significant diagnostics from this hospitalization (including imaging, microbiology, ancillary and laboratory) are listed below for reference.     Significant Diagnostic Studies: Ct Angio Chest Pe W Or Wo Contrast  Result Date: 08/02/2019 CLINICAL DATA:  Chest pain. Dyspnea. Hypoxia. Upper abdominal pain for 3 months intermittently. EXAM: CT ANGIOGRAPHY CHEST CT ABDOMEN AND PELVIS WITH CONTRAST TECHNIQUE: Multidetector CT imaging of the chest was performed using the standard protocol during bolus administration of intravenous contrast. Multiplanar CT image reconstructions and MIPs were obtained to evaluate the vascular anatomy. Multidetector CT imaging of the abdomen and pelvis was performed using the standard protocol  during bolus administration of intravenous contrast. CONTRAST:  148m OMNIPAQUE IOHEXOL 350 MG/ML SOLN COMPARISON:  09/16/2018 chest CT angiogram. 06/09/2019 CT abdomen/pelvis. FINDINGS: CTA CHEST FINDINGS Cardiovascular: The study is high quality for the evaluation of pulmonary embolism. There are no filling defects in the central, lobar, segmental or subsegmental pulmonary artery branches to suggest acute pulmonary embolism. Minimally atherosclerotic nonaneurysmal thoracic aorta. Top-normal main pulmonary artery (3.0 cm diameter). Normal heart size. No significant pericardial fluid/thickening. Left anterior descending coronary atherosclerosis. Mediastinum/Nodes: No discrete thyroid nodules. Unremarkable esophagus. No axillary adenopathy. Mildly enlarged 1.0 cm subcarinal node (series 2/image 50), new. Newly mildly enlarged AP window nodes up to the 1.1 cm (series 2/image 30). New bilateral hilar adenopathy up to 1.0 cm on the right (series 2/image 57) and 1.1 cm on the left (series 2/image 47). Lungs/Pleura: No pneumothorax. Trace dependent bilateral pleural effusions, left greater than right. Severe centrilobular emphysema with mild diffuse bronchial wall thickening. Saber sheath trachea. No lung masses or significant pulmonary nodules. Mild patchy consolidation in the dependent superior segment left lower lobe. Scattered curvilinear  opacities throughout the dependent lungs bilaterally. Musculoskeletal: No aggressive appearing focal osseous lesions. Partially visualized surgical hardware from ACDF. Review of the MIP images confirms the above findings. CT ABDOMEN AND PELVIS FINDINGS Hepatobiliary: Normal liver with no liver mass. Normal gallbladder with no radiopaque cholelithiasis. No biliary ductal dilatation. Pancreas: Normal, with no mass or duct dilation. Spleen: Normal size. No mass. Adrenals/Urinary Tract: Stable adrenals without discrete adrenal nodules. Normal kidneys with no hydronephrosis and no renal mass. Normal bladder. Stomach/Bowel: Normal non-distended stomach. Normal caliber small bowel with no small bowel wall thickening. Normal appendix. Normal large bowel with no diverticulosis, large bowel wall thickening or pericolonic fat stranding. Vascular/Lymphatic: Atherosclerotic nonaneurysmal abdominal aorta. Patent portal, splenic, hepatic and renal veins. No pathologically enlarged lymph nodes in the abdomen or pelvis. Reproductive: Mildly enlarged prostate. Other: No pneumoperitoneum, ascites or focal fluid collection. Musculoskeletal: No aggressive appearing focal osseous lesions. Marked lumbar spondylosis. Status post posterior spinal fusion on the left at L3-4. IMPRESSION: 1. No pulmonary embolism. 2. Severe centrilobular emphysema with mild diffuse bronchial wall thickening and saber sheath trachea, suggesting COPD. 3. Mild patchy consolidation in the dependent superior segment left lower lobe, cannot excluded pneumonia. Curvilinear opacities scattered throughout the dependent lungs, compatible with mild scarring versus atelectasis. 4. Trace dependent bilateral pleural effusions. 5. One vessel coronary atherosclerosis. 6. New mild bilateral mediastinal and bilateral hilar lymphadenopathy, nonspecific. 7. No acute abnormality in the abdomen or pelvis. No evidence of bowel obstruction or acute bowel inflammation. Normal  appendix. 8. Mildly enlarged prostate. 9. Aortic Atherosclerosis (ICD10-I70.0) and Emphysema (ICD10-J43.9). Electronically Signed   By: JIlona SorrelM.D.   On: 08/02/2019 20:38   Ct Abdomen Pelvis W Contrast  Result Date: 08/02/2019 CLINICAL DATA:  Chest pain. Dyspnea. Hypoxia. Upper abdominal pain for 3 months intermittently. EXAM: CT ANGIOGRAPHY CHEST CT ABDOMEN AND PELVIS WITH CONTRAST TECHNIQUE: Multidetector CT imaging of the chest was performed using the standard protocol during bolus administration of intravenous contrast. Multiplanar CT image reconstructions and MIPs were obtained to evaluate the vascular anatomy. Multidetector CT imaging of the abdomen and pelvis was performed using the standard protocol during bolus administration of intravenous contrast. CONTRAST:  1012mOMNIPAQUE IOHEXOL 350 MG/ML SOLN COMPARISON:  09/16/2018 chest CT angiogram. 06/09/2019 CT abdomen/pelvis. FINDINGS: CTA CHEST FINDINGS Cardiovascular: The study is high quality for the evaluation of pulmonary embolism. There are no filling defects in the central, lobar, segmental or subsegmental pulmonary  artery branches to suggest acute pulmonary embolism. Minimally atherosclerotic nonaneurysmal thoracic aorta. Top-normal main pulmonary artery (3.0 cm diameter). Normal heart size. No significant pericardial fluid/thickening. Left anterior descending coronary atherosclerosis. Mediastinum/Nodes: No discrete thyroid nodules. Unremarkable esophagus. No axillary adenopathy. Mildly enlarged 1.0 cm subcarinal node (series 2/image 50), new. Newly mildly enlarged AP window nodes up to the 1.1 cm (series 2/image 30). New bilateral hilar adenopathy up to 1.0 cm on the right (series 2/image 57) and 1.1 cm on the left (series 2/image 47). Lungs/Pleura: No pneumothorax. Trace dependent bilateral pleural effusions, left greater than right. Severe centrilobular emphysema with mild diffuse bronchial wall thickening. Saber sheath trachea. No lung  masses or significant pulmonary nodules. Mild patchy consolidation in the dependent superior segment left lower lobe. Scattered curvilinear opacities throughout the dependent lungs bilaterally. Musculoskeletal: No aggressive appearing focal osseous lesions. Partially visualized surgical hardware from ACDF. Review of the MIP images confirms the above findings. CT ABDOMEN AND PELVIS FINDINGS Hepatobiliary: Normal liver with no liver mass. Normal gallbladder with no radiopaque cholelithiasis. No biliary ductal dilatation. Pancreas: Normal, with no mass or duct dilation. Spleen: Normal size. No mass. Adrenals/Urinary Tract: Stable adrenals without discrete adrenal nodules. Normal kidneys with no hydronephrosis and no renal mass. Normal bladder. Stomach/Bowel: Normal non-distended stomach. Normal caliber small bowel with no small bowel wall thickening. Normal appendix. Normal large bowel with no diverticulosis, large bowel wall thickening or pericolonic fat stranding. Vascular/Lymphatic: Atherosclerotic nonaneurysmal abdominal aorta. Patent portal, splenic, hepatic and renal veins. No pathologically enlarged lymph nodes in the abdomen or pelvis. Reproductive: Mildly enlarged prostate. Other: No pneumoperitoneum, ascites or focal fluid collection. Musculoskeletal: No aggressive appearing focal osseous lesions. Marked lumbar spondylosis. Status post posterior spinal fusion on the left at L3-4. IMPRESSION: 1. No pulmonary embolism. 2. Severe centrilobular emphysema with mild diffuse bronchial wall thickening and saber sheath trachea, suggesting COPD. 3. Mild patchy consolidation in the dependent superior segment left lower lobe, cannot excluded pneumonia. Curvilinear opacities scattered throughout the dependent lungs, compatible with mild scarring versus atelectasis. 4. Trace dependent bilateral pleural effusions. 5. One vessel coronary atherosclerosis. 6. New mild bilateral mediastinal and bilateral hilar lymphadenopathy,  nonspecific. 7. No acute abnormality in the abdomen or pelvis. No evidence of bowel obstruction or acute bowel inflammation. Normal appendix. 8. Mildly enlarged prostate. 9. Aortic Atherosclerosis (ICD10-I70.0) and Emphysema (ICD10-J43.9). Electronically Signed   By: Ilona Sorrel M.D.   On: 08/02/2019 20:38   Dg Chest Portable 1 View  Result Date: 08/02/2019 CLINICAL DATA:  Chest pain, shortness of breath EXAM: PORTABLE CHEST 1 VIEW COMPARISON:  June 13 2019 FINDINGS: Interstitial prominence, which appears slightly increased. Background changes of COPD. No pleural effusion or pneumothorax. Normal heart size. Enlarged pulmonary arteries similar to prior. Partially imaged cervical spine fixation hardware. IMPRESSION: Increased interstitial prominence likely reflecting mild edema. Enlarged pulmonary arteries suggesting pulmonary arterial hypertension. Electronically Signed   By: Macy Mis M.D.   On: 08/02/2019 11:41     Microbiology: Recent Results (from the past 240 hour(s))  Respiratory Panel by PCR     Status: None   Collection Time: 08/03/19 12:14 PM   Specimen: Nasopharyngeal Swab; Respiratory  Result Value Ref Range Status   Adenovirus NOT DETECTED NOT DETECTED Final   Coronavirus 229E NOT DETECTED NOT DETECTED Final    Comment: (NOTE) The Coronavirus on the Respiratory Panel, DOES NOT test for the novel  Coronavirus (2019 nCoV)    Coronavirus HKU1 NOT DETECTED NOT DETECTED Final   Coronavirus NL63 NOT DETECTED  NOT DETECTED Final   Coronavirus OC43 NOT DETECTED NOT DETECTED Final   Metapneumovirus NOT DETECTED NOT DETECTED Final   Rhinovirus / Enterovirus NOT DETECTED NOT DETECTED Final   Influenza A NOT DETECTED NOT DETECTED Final   Influenza B NOT DETECTED NOT DETECTED Final   Parainfluenza Virus 1 NOT DETECTED NOT DETECTED Final   Parainfluenza Virus 2 NOT DETECTED NOT DETECTED Final   Parainfluenza Virus 3 NOT DETECTED NOT DETECTED Final   Parainfluenza Virus 4 NOT  DETECTED NOT DETECTED Final   Respiratory Syncytial Virus NOT DETECTED NOT DETECTED Final   Bordetella pertussis NOT DETECTED NOT DETECTED Final   Chlamydophila pneumoniae NOT DETECTED NOT DETECTED Final   Mycoplasma pneumoniae NOT DETECTED NOT DETECTED Final    Comment: Performed at [facility], [place] [address] [place], [place] [address]     Labs: Basic Metabolic Panel: Recent Labs  Lab 08/02/19 1150 08/03/19 0726 08/04/19 0655 08/05/19 0621  NA 141 140 139 140  K 4.6 4.4 4.5 4.1  CL 100 97* 96* 94*  CO2 33* 32 32 32  GLUCOSE 114* 76 129* 130*  BUN _0 30*  CREATININE 1.24 1.31* 1.53* 1.67*  CALCIUM 8.7* 8.9 9.3 9.1  MG  --   --   --  1.8   Liver Function Tests: No results for input(s): AST, ALT, ALKPHOS, BILITOT, PROT, ALBUMIN in the last 168 hours. No results for input(s): LIPASE, AMYLASE in the last 168 hours. No results for input(s): AMMONIA in the last 168 hours. CBC: Recent Labs  Lab 08/02/19 1150 08/03/19 0726 08/04/19 0655  WBC 7.9 6.9 9.7  HGB 12.5* 11.6* 12.5*  HCT 42.6 41.3 42.1  MCV 87.8 87.5 83.2  PLT 284 351 411*   Cardiac Enzymes: No results for input(s): CKTOTAL, CKMB, CKMBINDEX, TROPONINI in the last 168 hours. BNP: Invalid input(s): POCBNP CBG: Recent Labs  Lab 08/03/19 1618 08/04/19 0645 08/04/19 0725 08/05/19 0613 08/05/19 0747  GLUCAP 126* 143* 129* 142* 125*    Time coordinating discharge:  36 minutes  Signed:  Orson Eva, DO [facility] Pager: [phone] 08/05/2019, 10:37 AM

## 2019-08-05 DIAGNOSIS — N183 Chronic kidney disease, stage 3 unspecified: Secondary | ICD-10-CM

## 2019-08-05 LAB — MAGNESIUM: Magnesium: 1.8 mg/dL (ref 1.7–2.4)

## 2019-08-05 LAB — BASIC METABOLIC PANEL
Anion gap: 14 (ref 5–15)
BUN: 30 mg/dL — ABNORMAL HIGH (ref 8–23)
CO2: 32 mmol/L (ref 22–32)
Calcium: 9.1 mg/dL (ref 8.9–10.3)
Chloride: 94 mmol/L — ABNORMAL LOW (ref 98–111)
Creatinine, Ser: 1.67 mg/dL — ABNORMAL HIGH (ref 0.61–1.24)
GFR calc Af Amer: 50 mL/min — ABNORMAL LOW (ref >60)
GFR calc non Af Amer: 43 mL/min — ABNORMAL LOW (ref >60)
Glucose, Bld: 130 mg/dL — ABNORMAL HIGH (ref 70–99)
Potassium: 4.1 mmol/L (ref 3.5–5.1)
Sodium: 140 mmol/L (ref 135–145)

## 2019-08-05 LAB — STREP PNEUMONIAE URINARY ANTIGEN: Strep Pneumo Urinary Antigen: NEGATIVE

## 2019-08-05 LAB — GLUCOSE, CAPILLARY
Glucose-Capillary: 142 mg/dL — ABNORMAL HIGH (ref 70–99)
Glucose-Capillary: 125 mg/dL — ABNORMAL HIGH (ref 70–99)

## 2019-08-05 MED ORDER — AZITHROMYCIN 500 MG PO TABS: 500.0000 mg | ORAL_TABLET | Freq: Every day | ORAL | 1 refills | Status: DC

## 2019-08-05 MED ORDER — PREDNISONE 20 MG PO TABS: 60.0000 mg | ORAL_TABLET | Freq: Every day | ORAL | 0 refills | Status: DC

## 2019-08-05 NOTE — Progress Notes (Signed)
Nsg Discharge Note  Admit Date:  08/02/2019 Discharge date: 08/05/2019   Bryan [name] to be D/C'd Home per MD order.  AVS completed. Patient able to verbalize understanding.  Discharge Medication: Allergies as of 08/05/2019   No Known Allergies     Medication List    STOP taking these medications   amLODipine 2.5 MG tablet Commonly known as: NORVASC   potassium chloride 10 MEQ tablet Commonly known as: KLOR-CON   tamsulosin 0.4 MG Caps capsule Commonly known as: FLOMAX     TAKE these medications   acetaminophen 325 MG tablet Commonly known as: TYLENOL Take 2 tablets (650 mg total) by mouth every 6 (six) hours as needed for mild pain (or Fever >/= 101).   albuterol (2.5 MG/3ML) 0.083% nebulizer solution Commonly known as: PROVENTIL Take 3 mLs (2.5 mg total) by nebulization every 4 (four) hours as needed for wheezing or shortness of breath.   albuterol 108 (90 Base) MCG/ACT inhaler Commonly known as: VENTOLIN HFA Inhale 2 puffs into the lungs every 4 (four) hours as needed for wheezing or shortness of breath. Do not use with nebulizer   azithromycin 500 MG tablet Commonly known as: ZITHROMAX Take 1 tablet (500 mg total) by mouth daily.   gabapentin 300 MG capsule Commonly known as: NEURONTIN Take 1 capsule (300 mg total) by mouth 2 (two) times daily. Pt. Says he is taking twice daily. What changed:   how much to take  when to take this   guaiFENesin 600 MG 12 hr tablet Commonly known as: Mucinex Take 1 tablet (600 mg total) by mouth 2 (two) times daily.   linaclotide 72 MCG capsule Commonly known as: Linzess Take 1 capsule (72 mcg total) by mouth daily before breakfast.   MiraLax 17 GM/SCOOP powder Generic drug: polyethylene glycol powder Take 17 g by mouth daily as needed (FOR CONSTIPATION).   OXYGEN Inhale 2.5 L into the lungs daily.   pantoprazole 40 MG tablet Commonly known as: PROTONIX Take 1 tablet (40 mg total) by mouth daily before breakfast.   predniSONE 20 MG tablet Commonly known as: DELTASONE Take 3 tablets (60 mg total) by mouth daily with breakfast. And decrease by one tablet daily Start taking on: August 06, 2019   Trelegy Ellipta 100-62.5-25 MCG/INH Aepb Generic drug: Fluticasone-Umeclidin-Vilant Inhale 1 puff into the lungs daily.       Discharge Assessment: Vitals:   08/05/19 0414 08/05/19 0743  BP: (!) 103/2   Pulse: 88   Resp: 14   Temp: 98.8 F (37.1 C)   SpO2: 96% 97%   Skin clean, dry and intact without evidence of skin break down, no evidence of skin tears noted. IV catheter discontinued intact. Site without signs and symptoms of complications - no redness or edema noted at insertion site, patient denies c/o pain - only slight tenderness at site.  Dressing with slight pressure applied.  D/c Instructions-Education: Discharge instructions given to patient with verbalized understanding. D/c education completed with patient including follow up instructions, medication list, d/c activities limitations if indicated, with other d/c instructions as indicated by MD - patient able to verbalize understanding, all questions fully answered. Patient instructed to return to ED, call 911, or call MD for any changes in condition.  Patient escorted via [place], and D/C home via private auto.  Mabrey Howland C, RN 08/05/2019 11:30 AM

## 2019-08-05 NOTE — ED Provider Notes (Signed)
[facility] Provider Note   CSN: [id number] Arrival date & time: 08/02/19  6073     History   Chief Complaint Chief Complaint  Patient presents with  . Chest Pain    HPI Bryan [name] is a 63 y.o. male.     Patient complains of chest and abdominal pain.  Patient has history of COPD  The history is provided by the patient. No language interpreter was used.  Chest Pain Pain location:  Unable to specify Pain quality: aching   Pain radiates to:  Does not radiate Pain severity:  Moderate Onset quality:  Sudden Timing:  Constant Progression:  Worsening Chronicity:  New Context: breathing   Relieved by:  Nothing Associated symptoms: no abdominal pain, no back pain, no cough, no fatigue and no headache     Past Medical History:  Diagnosis Date  . Acid reflux   . Arthritis   . Asthma   . Chronic pain    with leg and back pain (disc problem)  . COPD (chronic obstructive pulmonary disease) ([place])   . Diabetes mellitus without complication ([place])   . Headache    HX OF  . History of upper GI x-ray series    to follow showed large duodenal ulcer H pylori serologies were negative  . Hypertension   . Pneumonia    07/17/17  . Pre-diabetes   . Stroke [facility])    TIA MINI STROKE  . Tubular adenoma     Patient Active Problem List   Diagnosis Date Noted  . Chronic diastolic CHF (congestive heart failure) ([place]) 08/03/2019  . Acute on chronic respiratory failure ([place]) 08/02/2019  . Acute exacerbation of CHF (congestive heart failure) /HFpEF (EF 60%) 06/13/2019  . Normocytic anemia 02/15/2019  . COPD exacerbation ([place]) 09/16/2018  . Hypokalemia 06/26/2018  . Hypomagnesemia 06/26/2018  . Anemia 06/26/2018  . BPH (benign prostatic hyperplasia) 06/26/2018  . Chronic respiratory failure with hypoxia ([place]) 06/04/2018  . HCAP (healthcare-associated pneumonia) 06/04/2018  . Acute respiratory failure with hypoxia ([place]) 04/14/2018  . Pulmonary nodule 04/14/2018   . CKD (chronic kidney disease), stage III 04/14/2018  . Type 2 diabetes mellitus with hyperlipidemia ([place]) 03/12/2018  . BPH with obstruction/lower urinary tract symptoms 08/17/2017  . Left sided numbness 07/22/2017  . TIA (transient ischemic attack) 07/22/2017  . Pneumonia 07/14/2017  . COPD with acute exacerbation ([place]) 07/14/2017  . Constipation 07/10/2017  . Hx of adenomatous colonic polyps 05/01/2017  . Hypertension   . Chronic pain   . Heme positive stool 10/20/2014  . Epigastric pain 06/19/2014  . Dysphagia 06/19/2014  . AP (abdominal pain) 11/13/2013  . Early satiety 11/13/2013  . COPD, severe ([place]) 02/22/2013  . Low back pain 11/19/2012  . COLITIS 05/12/2009  . ABDOMINAL PAIN, LEFT LOWER QUADRANT 05/12/2009  . DUODENAL ULCER, HX OF 05/12/2009  . H/o Alcohol abuse- Quit in 2010 05/11/2009  . GERD 05/11/2009  . Diarrhea 05/11/2009  . SPONDYLOSIS, CERVICAL 07/03/2007  . NECK PAIN 07/03/2007    Past Surgical History:  Procedure Laterality Date  . BACK SURGERY  7/05; 5/09    Dr.Hirsch,3 lumbar  X3  . BACK SURGERY    . COLONOSCOPY  Feb 2012   Dr. Gala Romney: normal rectum, pedunculate polyp removed but not recovered  . COLONOSCOPY WITH ESOPHAGOGASTRODUODENOSCOPY (EGD) N/A 11/18/2013   Dr.Rourk- tcs= normal rectum, multipe polyps about the ileocecal valve and distal transverse colon o/w the remainder of the colonic mucosa appeared normal bx= tubular adenoma.  EGD= normal esophagus, stomach with scattered erosions mottling, friablility, no ulcer or infiltrating process patent pylorus bx= chronic inflammation. next TCS 11/2016  . COLONOSCOPY WITH PROPOFOL N/A 05/25/2017   Dr. Gala Romney: sigmoid diverticulosis, one 4 mm hyerplastic rectal polyp, ascending colonic AVMs surveillance 2023  . ESOPHAGOGASTRODUODENOSCOPY  1/06   Dr. Volney American esophageal erosions,U-shaped stomach,marked erosions and edema of the bulb without discrete ulcer disease.   . ESOPHAGOGASTRODUODENOSCOPY (EGD) WITH  PROPOFOL N/A 05/25/2017   Dr. Gala Romney: reflux esophagitis s/p empiric dilation, normal stomach and duodenum  . MALONEY DILATION N/A 05/25/2017   Procedure: Venia Minks DILATION;  Surgeon: Daneil Dolin, MD;  Location: [facility];  Service: Endoscopy;  Laterality: N/A;  . NECK SURGERY  10/2007   PLATE IN NECK  . POLYPECTOMY  05/25/2017   Procedure: POLYPECTOMY;  Surgeon: Daneil Dolin, MD;  Location: [facility];  Service: Endoscopy;;  rectal  . TRANSURETHRAL RESECTION OF PROSTATE N/A 08/17/2017   Procedure: TRANSURETHRAL RESECTION OF THE PROSTATE (TURP);  Surgeon: Irine Seal, MD;  Location: [facility];  Service: Urology;  Laterality: N/A;        Home Medications    Prior to Admission medications   Medication Sig Start Date End Date Taking? Authorizing Provider  acetaminophen (TYLENOL) 325 MG tablet Take 2 tablets (650 mg total) by mouth every 6 (six) hours as needed for mild pain (or Fever >/= 101). 06/14/19  Yes Emokpae, Courage, MD  albuterol (PROVENTIL) (2.5 MG/3ML) 0.083% nebulizer solution Take 3 mLs (2.5 mg total) by nebulization every 4 (four) hours as needed for wheezing or shortness of breath. 06/14/19  Yes Emokpae, Courage, MD  albuterol (VENTOLIN HFA) 108 (90 Base) MCG/ACT inhaler Inhale 2 puffs into the lungs every 4 (four) hours as needed for wheezing or shortness of breath. Do not use with nebulizer 06/14/19  Yes Emokpae, Courage, MD  amLODipine (NORVASC) 2.5 MG tablet Take 1 tablet (2.5 mg total) by mouth daily. 06/14/19 06/13/20 Yes Emokpae, Courage, MD  Fluticasone-Umeclidin-Vilant (TRELEGY ELLIPTA) 100-62.5-25 MCG/INH AEPB Inhale 1 puff into the lungs daily. 06/14/19  Yes Emokpae, Courage, MD  gabapentin (NEURONTIN) 300 MG capsule Take 1 capsule (300 mg total) by mouth 2 (two) times daily. Pt. Says he is taking twice daily. Patient taking differently: Take 600 mg by mouth daily. Pt. Says he is taking twice daily. 06/14/19  Yes Emokpae, Courage, MD  guaiFENesin (MUCINEX) 600 MG 12 hr  tablet Take 1 tablet (600 mg total) by mouth 2 (two) times daily. 06/14/19 06/13/20 Yes Roxan Hockey, MD  linaclotide (LINZESS) 72 MCG capsule Take 1 capsule (72 mcg total) by mouth daily before breakfast. 06/14/19  Yes Emokpae, Courage, MD  pantoprazole (PROTONIX) 40 MG tablet Take 1 tablet (40 mg total) by mouth daily before breakfast. 02/15/19  Yes [name], NP  polyethylene glycol powder (MIRALAX) 17 GM/SCOOP powder Take 17 g by mouth daily as needed (FOR CONSTIPATION).  11/14/18  Yes [provider]  OXYGEN Inhale 2.5 L into the lungs daily.    [provider]  potassium chloride (KLOR-CON) 10 MEQ tablet Take 1 tablet (10 mEq total) by mouth every Tuesday, Thursday, Saturday, and Sunday. Patient not taking: Reported on 08/02/2019 06/15/19   Roxan Hockey, MD  tamsulosin (FLOMAX) 0.4 MG CAPS capsule Take 1 capsule (0.4 mg total) by mouth daily after supper. Patient not taking: Reported on 08/02/2019 06/14/19   Roxan Hockey, MD    Family History Family History  Problem Relation Age of Onset  . Cancer  Father   . Asthma Mother   . Colon cancer Neg Hx     Social History Social History   Tobacco Use  . Smoking status: Former Smoker    Packs/day: 0.50    Years: 40.00    Pack years: 20.00    Types: Cigarettes    Quit date: 11/11/2014    Years since quitting: 4.7  . Smokeless tobacco: Never Used  Substance Use Topics  . Alcohol use: No    Alcohol/week: 0.0 standard drinks  . Drug use: Yes    Types: Marijuana    Comment: occas     Allergies   Patient has no known allergies.   Review of Systems Review of Systems  Constitutional: Negative for appetite change and fatigue.  HENT: Negative for congestion, ear discharge and sinus pressure.   Eyes: Negative for discharge.  Respiratory: Negative for cough.   Cardiovascular: Positive for chest pain.  Gastrointestinal: Negative for abdominal pain and diarrhea.       Abdominal pain  Genitourinary: Negative  for frequency and hematuria.  Musculoskeletal: Negative for back pain.  Skin: Negative for rash.  Neurological: Negative for seizures and headaches.  Psychiatric/Behavioral: Negative for hallucinations.     Physical Exam Updated Vital Signs BP (!) 103/2 (BP Location: Left Arm)   Pulse 88   Temp 98.8 F (37.1 C)   Resp 14   Ht _0  (1.6 m)   Wt 53.7 kg   SpO2 97%   BMI 20.96 kg/m   Physical Exam Vitals signs and nursing note reviewed.  Constitutional:      Appearance: He is well-developed.  HENT:     Head: Normocephalic.     Nose: Nose normal.  Eyes:     General: No scleral icterus.    Conjunctiva/sclera: Conjunctivae normal.  Neck:     Musculoskeletal: Neck supple.     Thyroid: No thyromegaly.  Cardiovascular:     Rate and Rhythm: Normal rate and regular rhythm.     Heart sounds: No murmur. No friction rub. No gallop.   Pulmonary:     Breath sounds: No stridor. No wheezing or rales.  Chest:     Chest wall: No tenderness.  Abdominal:     General: There is no distension.     Tenderness: There is abdominal tenderness. There is no rebound.  Musculoskeletal: Normal range of motion.  Lymphadenopathy:     Cervical: No cervical adenopathy.  Skin:    Findings: No erythema or rash.  Neurological:     Mental Status: He is alert and oriented to person, place, and time.     Motor: No abnormal muscle tone.     Coordination: Coordination normal.  Psychiatric:        Behavior: Behavior normal.      ED Treatments / Results  Labs (all labs ordered are listed, but only abnormal results are displayed) Labs Reviewed  BASIC METABOLIC PANEL - Abnormal; Notable for the following components:      Result Value   CO2 33 (*)    Glucose, Bld 114 (*)    Calcium 8.7 (*)    All other components within normal limits  CBC - Abnormal; Notable for the following components:   Hemoglobin 12.5 (*)    MCH 25.8 (*)    MCHC 29.3 (*)    RDW 17.5 (*)    All other components within  normal limits  URINALYSIS, ROUTINE W REFLEX MICROSCOPIC - Abnormal; Notable for the following components:   Protein,  ur 100 (*)    All other components within normal limits  BASIC METABOLIC PANEL - Abnormal; Notable for the following components:   Chloride 97 (*)    Creatinine, Ser 1.31 (*)    GFR calc non Af Amer 58 (*)    All other components within normal limits  CBC - Abnormal; Notable for the following components:   Hemoglobin 11.6 (*)    MCH 24.6 (*)    MCHC 28.1 (*)    RDW 17.3 (*)    All other components within normal limits  GLUCOSE, CAPILLARY - Abnormal; Notable for the following components:   Glucose-Capillary 149 (*)    All other components within normal limits  HEMOGLOBIN A1C - Abnormal; Notable for the following components:   Hgb A1c MFr Bld 6.8 (*)    All other components within normal limits  RAPID URINE DRUG SCREEN, HOSP PERFORMED - Abnormal; Notable for the following components:   Opiates POSITIVE (*)    Tetrahydrocannabinol POSITIVE (*)    All other components within normal limits  GLUCOSE, CAPILLARY - Abnormal; Notable for the following components:   Glucose-Capillary 126 (*)    All other components within normal limits  BASIC METABOLIC PANEL - Abnormal; Notable for the following components:   Chloride 96 (*)    Glucose, Bld 129 (*)    Creatinine, Ser 1.53 (*)    GFR calc non Af Amer 48 (*)    GFR calc Af Amer 55 (*)    All other components within normal limits  CBC - Abnormal; Notable for the following components:   Hemoglobin 12.5 (*)    MCH 24.7 (*)    MCHC 29.7 (*)    RDW 16.6 (*)    Platelets 411 (*)    All other components within normal limits  GLUCOSE, CAPILLARY - Abnormal; Notable for the following components:   Glucose-Capillary 143 (*)    All other components within normal limits  GLUCOSE, CAPILLARY - Abnormal; Notable for the following components:   Glucose-Capillary 129 (*)    All other components within normal limits  BASIC METABOLIC  PANEL - Abnormal; Notable for the following components:   Chloride 94 (*)    Glucose, Bld 130 (*)    BUN 30 (*)    Creatinine, Ser 1.67 (*)    GFR calc non Af Amer 43 (*)    GFR calc Af Amer 50 (*)    All other components within normal limits  GLUCOSE, CAPILLARY - Abnormal; Notable for the following components:   Glucose-Capillary 142 (*)    All other components within normal limits  GLUCOSE, CAPILLARY - Abnormal; Notable for the following components:   Glucose-Capillary 125 (*)    All other components within normal limits  RESPIRATORY PANEL BY PCR  BRAIN NATRIURETIC PEPTIDE  HIV ANTIBODY (ROUTINE TESTING W REFLEX)  PROCALCITONIN  LEGIONELLA PNEUMOPHILA SEROGP 1 UR AG  STREP PNEUMONIAE URINARY ANTIGEN  MAGNESIUM  POC SARS CORONAVIRUS 2 AG -  ED  TROPONIN I (HIGH SENSITIVITY)  TROPONIN I (HIGH SENSITIVITY)    EKG EKG Interpretation  Date/Time:  Friday August 02 2019 10:19:10 EST Ventricular Rate:  109 PR Interval:    QRS Duration: 121 QT Interval:  348 QTC Calculation: 458 R Axis:   88 Text Interpretation: Sinus tachycardia Ventricular premature complex Nonspecific intraventricular conduction delay Probable lateral infarct, age indeterminate Borderline ST elevation, anterior leads Confirmed by Milton Ferguson [phone]) on 08/02/2019 10:27:19 AM   Radiology No results found.  Procedures Procedures (including  critical care time)  Medications Ordered in ED Medications  enoxaparin (LOVENOX) injection 40 mg (40 mg Subcutaneous Given 08/04/19 2115)  acetaminophen (TYLENOL) tablet 650 mg (650 mg Oral Given 08/04/19 0847)    Or  acetaminophen (TYLENOL) suppository 650 mg ( Rectal See Alternative 08/04/19 0847)  ondansetron (ZOFRAN) tablet 4 mg (has no administration in time range)    Or  ondansetron (ZOFRAN) injection 4 mg (has no administration in time range)  polyethylene glycol (MIRALAX / GLYCOLAX) packet 17 g (has no administration in time range)   ipratropium-albuterol (DUONEB) 0.5-2.5 (3) MG/3ML nebulizer solution 3 mL (3 mLs Nebulization Given 08/03/19 0545)  pantoprazole (PROTONIX) injection 40 mg (40 mg Intravenous Given 08/04/19 2114)  gabapentin (NEURONTIN) capsule 300 mg (300 mg Oral Given 08/04/19 2115)  linaclotide (LINZESS) capsule 72 mcg (72 mcg Oral Given 08/04/19 0847)  budesonide (PULMICORT) nebulizer solution 0.5 mg (0.5 mg Nebulization Given 08/05/19 0743)  predniSONE (DELTASONE) tablet 60 mg (has no administration in time range)  azithromycin (ZITHROMAX) tablet 500 mg (500 mg Oral Given 08/04/19 2115)  ipratropium-albuterol (DUONEB) 0.5-2.5 (3) MG/3ML nebulizer solution 3 mL (3 mLs Nebulization Given 08/05/19 0743)  HYDROcodone-acetaminophen (NORCO/VICODIN) 5-325 MG per tablet 1 tablet (1 tablet Oral Given 08/02/19 1328)  furosemide (LASIX) injection 40 mg (40 mg Intravenous Given 08/02/19 1335)  iohexol (OMNIPAQUE) 350 MG/ML injection 100 mL (100 mLs Intravenous Contrast Given 08/02/19 1922)  calcium carbonate (TUMS - dosed in mg elemental calcium) chewable tablet 200 mg of elemental calcium (200 mg of elemental calcium Oral Given 08/04/19 0846)  methylPREDNISolone sodium succinate (SOLU-MEDROL) 125 mg/2 mL injection 60 mg (60 mg Intravenous Given 08/04/19 1824)  diphenhydrAMINE (BENADRYL) injection 25 mg (25 mg Intravenous Given 08/03/19 2233)     Initial Impression / Assessment and Plan / ED Course  I have reviewed the triage vital signs and the nursing notes.  Pertinent labs & imaging results that were available during my care of the patient were reviewed by me and considered in my medical decision making (see chart for details).       CRITICAL CARE Performed by: Milton Ferguson Total critical care time:45 minutes Critical care time was exclusive of separately billable procedures and treating other patients. Critical care was necessary to treat or prevent imminent or life-threatening deterioration. Critical  care was time spent personally by me on the following activities: development of treatment plan with patient and/or surrogate as well as nursing, discussions with consultants, evaluation of patient's response to treatment, examination of patient, obtaining history from patient or surrogate, ordering and performing treatments and interventions, ordering and review of laboratory studies, ordering and review of radiographic studies, pulse oximetry and re-evaluation of patient's condition.  Patient with epigastric pain hypoxia and COPD.  Patient having respiratory failure from his COPD.  Abdominal pain could be related to increased work of breathing Final Clinical Impressions(s) / ED Diagnoses   Final diagnoses:  Acute respiratory failure with hypoxia [facility])    ED Discharge Orders    None       Milton Ferguson, MD 08/05/19 [phone]

## 2019-08-06 ENCOUNTER — Other Ambulatory Visit: Payer: Self-pay

## 2019-08-09 DIAGNOSIS — J4520 Mild intermittent asthma, uncomplicated: Secondary | ICD-10-CM | POA: Diagnosis not present

## 2019-08-09 DIAGNOSIS — J449 Chronic obstructive pulmonary disease, unspecified: Secondary | ICD-10-CM | POA: Diagnosis not present

## 2019-08-09 DIAGNOSIS — J441 Chronic obstructive pulmonary disease with (acute) exacerbation: Secondary | ICD-10-CM | POA: Diagnosis not present

## 2019-08-09 DIAGNOSIS — R062 Wheezing: Secondary | ICD-10-CM | POA: Diagnosis not present

## 2019-08-12 ENCOUNTER — Telehealth: Payer: Self-pay

## 2019-08-12 DIAGNOSIS — E1142 Type 2 diabetes mellitus with diabetic polyneuropathy: Secondary | ICD-10-CM | POA: Diagnosis not present

## 2019-08-12 DIAGNOSIS — J441 Chronic obstructive pulmonary disease with (acute) exacerbation: Secondary | ICD-10-CM | POA: Diagnosis not present

## 2019-08-12 DIAGNOSIS — J189 Pneumonia, unspecified organism: Secondary | ICD-10-CM | POA: Diagnosis not present

## 2019-08-12 DIAGNOSIS — I5030 Unspecified diastolic (congestive) heart failure: Secondary | ICD-10-CM | POA: Diagnosis not present

## 2019-08-12 NOTE — Telephone Encounter (Signed)
He should only be taking Protonix. Omeprazole was prescribed by Dr. Legrand Rams prior to seeing me last.

## 2019-08-12 NOTE — Telephone Encounter (Signed)
Received a duplication notice from [facility] for Omeprazole and Pantoprazole. Placing in box for review by Roseanne Kaufman, NP.

## 2019-08-14 NOTE — Telephone Encounter (Signed)
PT said he is only taking the Protonix.

## 2019-08-16 ENCOUNTER — Other Ambulatory Visit: Payer: Self-pay

## 2019-08-26 ENCOUNTER — Other Ambulatory Visit: Payer: Self-pay

## 2019-08-26 DIAGNOSIS — N3281 Overactive bladder: Secondary | ICD-10-CM | POA: Diagnosis not present

## 2019-08-26 DIAGNOSIS — M6281 Muscle weakness (generalized): Secondary | ICD-10-CM | POA: Diagnosis not present

## 2019-08-26 DIAGNOSIS — M62838 Other muscle spasm: Secondary | ICD-10-CM | POA: Diagnosis not present

## 2019-08-26 DIAGNOSIS — R300 Dysuria: Secondary | ICD-10-CM | POA: Diagnosis not present

## 2019-08-26 DIAGNOSIS — M6289 Other specified disorders of muscle: Secondary | ICD-10-CM | POA: Diagnosis not present

## 2019-08-27 ENCOUNTER — Other Ambulatory Visit: Payer: Self-pay

## 2019-08-27 NOTE — Patient Outreach (Signed)
[place] [facility]) Care Management  08/27/2019  ZAMIRE WHITEHURST 11/04/1955 [id number]     [facility] CM Care Plan Problem One     Most Recent Value  Care Plan Problem One  Risk for Readmission  Role Documenting the Problem One  Care Management [place] for Problem One  Active  THN Long Term Goal   Patient will not be hospitalized over the next 45 days.  THN Long Term Goal Start Date  06/18/19  Interventions for Problem One Long Term Goal  Patient recently discharged (11/20) following admission for acute respiratory failure.  THN CM Short Term Goal #1   Over the next 30 days, patient will obtain a scale and weigh daily.  THN CM Short Term Goal #1 Start Date  06/18/19  THN CM Short Term Goal #2   Over the next 30 days, patient will take all medications as prescribed.  THN CM Short Term Goal #2 Start Date  06/18/19  THN CM Short Term Goal #2 Met Date  08/27/19    [facility] CM Care Plan Problem Two     Most Recent Value  Care Plan Problem Two  Risk for Falls  Role Documenting the Problem Two  Care Management Coordinator  Care Plan for Problem Two  Active  THN Long Term Goal  Over the next 45 days, patient will not experience falls.  THN Long Term Goal Start Date  04/30/19  THN Long Term Goal Met Date  06/18/19  [facility] CM Short Term Goal #1   Patient will be evaluated by PCP within a week.  THN CM Short Term Goal #1 Start Date  04/30/19  THN CM Short Term Goal #1 Met Date   05/22/19  THN CM Short Term Goal #2   Over the next 30 days, patient will use assistive device when ambulating.  THN CM Short Term Goal #2 Start Date  04/30/19  [facility] CM Short Term Goal #2 Met Date  06/18/19       [facility] Care Management [phone]

## 2019-09-03 ENCOUNTER — Encounter: Payer: Self-pay | Admitting: Urology

## 2019-09-11 DIAGNOSIS — I10 Essential (primary) hypertension: Secondary | ICD-10-CM | POA: Diagnosis not present

## 2019-09-11 DIAGNOSIS — E1142 Type 2 diabetes mellitus with diabetic polyneuropathy: Secondary | ICD-10-CM | POA: Diagnosis not present

## 2019-09-20 DIAGNOSIS — J4520 Mild intermittent asthma, uncomplicated: Secondary | ICD-10-CM | POA: Diagnosis not present

## 2019-09-20 DIAGNOSIS — J441 Chronic obstructive pulmonary disease with (acute) exacerbation: Secondary | ICD-10-CM | POA: Diagnosis not present

## 2019-09-20 DIAGNOSIS — R062 Wheezing: Secondary | ICD-10-CM | POA: Diagnosis not present

## 2019-09-20 DIAGNOSIS — J449 Chronic obstructive pulmonary disease, unspecified: Secondary | ICD-10-CM | POA: Diagnosis not present

## 2019-10-06 ENCOUNTER — Emergency Department (HOSPITAL_COMMUNITY): Payer: Medicare Other

## 2019-10-06 ENCOUNTER — Encounter (HOSPITAL_COMMUNITY): Payer: Self-pay | Admitting: Emergency Medicine

## 2019-10-06 ENCOUNTER — Emergency Department (HOSPITAL_COMMUNITY)
Admission: EM | Admit: 2019-10-06 | Discharge: 2019-10-06 | Disposition: A | Discharge status: Home / Self Care | Payer: Medicare Other | Attending: Emergency Medicine | Admitting: Emergency Medicine

## 2019-10-06 ENCOUNTER — Other Ambulatory Visit: Payer: Self-pay

## 2019-10-06 DIAGNOSIS — Z20822 Contact with and (suspected) exposure to covid-19: Secondary | ICD-10-CM | POA: Insufficient documentation

## 2019-10-06 DIAGNOSIS — Z87891 Personal history of nicotine dependence: Secondary | ICD-10-CM | POA: Insufficient documentation

## 2019-10-06 DIAGNOSIS — J441 Chronic obstructive pulmonary disease with (acute) exacerbation: Secondary | ICD-10-CM | POA: Insufficient documentation

## 2019-10-06 DIAGNOSIS — R079 Chest pain, unspecified: Secondary | ICD-10-CM | POA: Diagnosis not present

## 2019-10-06 DIAGNOSIS — F1210 Cannabis abuse, uncomplicated: Secondary | ICD-10-CM | POA: Insufficient documentation

## 2019-10-06 DIAGNOSIS — R0789 Other chest pain: Secondary | ICD-10-CM | POA: Diagnosis not present

## 2019-10-06 DIAGNOSIS — I5032 Chronic diastolic (congestive) heart failure: Secondary | ICD-10-CM | POA: Diagnosis not present

## 2019-10-06 DIAGNOSIS — E1122 Type 2 diabetes mellitus with diabetic chronic kidney disease: Secondary | ICD-10-CM | POA: Diagnosis not present

## 2019-10-06 DIAGNOSIS — R1010 Upper abdominal pain, unspecified: Secondary | ICD-10-CM | POA: Insufficient documentation

## 2019-10-06 DIAGNOSIS — Z79899 Other long term (current) drug therapy: Secondary | ICD-10-CM | POA: Diagnosis not present

## 2019-10-06 DIAGNOSIS — R1013 Epigastric pain: Secondary | ICD-10-CM | POA: Diagnosis not present

## 2019-10-06 DIAGNOSIS — Z8673 Personal history of transient ischemic attack (TIA), and cerebral infarction without residual deficits: Secondary | ICD-10-CM | POA: Diagnosis not present

## 2019-10-06 DIAGNOSIS — N183 Chronic kidney disease, stage 3 unspecified: Secondary | ICD-10-CM | POA: Insufficient documentation

## 2019-10-06 DIAGNOSIS — I130 Hypertensive heart and chronic kidney disease with heart failure and stage 1 through stage 4 chronic kidney disease, or unspecified chronic kidney disease: Secondary | ICD-10-CM | POA: Insufficient documentation

## 2019-10-06 DIAGNOSIS — R0602 Shortness of breath: Secondary | ICD-10-CM | POA: Diagnosis not present

## 2019-10-06 LAB — CBC
HCT: 45.7 % (ref 39.0–52.0)
Hemoglobin: 13.3 g/dL (ref 13.0–17.0)
MCH: 25.6 pg — ABNORMAL LOW (ref 26.0–34.0)
MCHC: 29.1 g/dL — ABNORMAL LOW (ref 30.0–36.0)
MCV: 88.1 fL (ref 80.0–100.0)
Platelets: 256 10*3/uL (ref 150–400)
RBC: 5.19 MIL/uL (ref 4.22–5.81)
RDW: 18.9 % — ABNORMAL HIGH (ref 11.5–15.5)
WBC: 7.1 10*3/uL (ref 4.0–10.5)
nRBC: 0 % (ref 0.0–0.2)

## 2019-10-06 LAB — COMPREHENSIVE METABOLIC PANEL
ALT: 14 U/L (ref 0–44)
AST: 21 U/L (ref 15–41)
Albumin: 3.8 g/dL (ref 3.5–5.0)
Alkaline Phosphatase: 92 U/L (ref 38–126)
Anion gap: 9 (ref 5–15)
BUN: 16 mg/dL (ref 8–23)
CO2: 35 mmol/L — ABNORMAL HIGH (ref 22–32)
Calcium: 9.1 mg/dL (ref 8.9–10.3)
Chloride: 98 mmol/L (ref 98–111)
Creatinine, Ser: 1.39 mg/dL — ABNORMAL HIGH (ref 0.61–1.24)
GFR calc Af Amer: >60 mL/min (ref >60)
GFR calc non Af Amer: 54 mL/min — ABNORMAL LOW (ref >60)
Glucose, Bld: 121 mg/dL — ABNORMAL HIGH (ref 70–99)
Potassium: 4.9 mmol/L (ref 3.5–5.1)
Sodium: 142 mmol/L (ref 135–145)
Total Bilirubin: 0.4 mg/dL (ref 0.3–1.2)
Total Protein: 7.7 g/dL (ref 6.5–8.1)

## 2019-10-06 LAB — URINALYSIS, ROUTINE W REFLEX MICROSCOPIC
Bacteria, UA: NONE SEEN
Bilirubin Urine: NEGATIVE
Glucose, UA: NEGATIVE mg/dL
Hgb urine dipstick: NEGATIVE
Ketones, ur: NEGATIVE mg/dL
Leukocytes,Ua: NEGATIVE
Nitrite: NEGATIVE
Protein, ur: 100 mg/dL — AB
Specific Gravity, Urine: 1.008 (ref 1.005–1.030)
pH: 6 (ref 5.0–8.0)

## 2019-10-06 LAB — TROPONIN I (HIGH SENSITIVITY)
Troponin I (High Sensitivity): 7 ng/L (ref <18)
Troponin I (High Sensitivity): 7 ng/L (ref <18)

## 2019-10-06 LAB — LIPASE, BLOOD: Lipase: 61 U/L — ABNORMAL HIGH (ref 11–51)

## 2019-10-06 LAB — POC SARS CORONAVIRUS 2 AG -  ED: SARS Coronavirus 2 Ag: NEGATIVE

## 2019-10-06 MED ORDER — PREDNISONE 10 MG (21) PO TBPK: ORAL_TABLET | ORAL | 0 refills | Status: AC

## 2019-10-06 MED ORDER — ALBUTEROL SULFATE HFA 108 (90 BASE) MCG/ACT IN AERS
4.0000 | INHALATION_SPRAY | Freq: Once | RESPIRATORY_TRACT | Status: AC
  Administered 2019-10-06: 13:00:00 4 via RESPIRATORY_TRACT

## 2019-10-06 MED ORDER — DOXYCYCLINE HYCLATE 100 MG PO TABS: 100.0000 mg | ORAL_TABLET | Freq: Two times a day (BID) | ORAL | 0 refills | Status: DC

## 2019-10-06 MED ORDER — ALBUTEROL SULFATE HFA 108 (90 BASE) MCG/ACT IN AERS
4.0000 | INHALATION_SPRAY | Freq: Once | RESPIRATORY_TRACT | Status: AC
  Administered 2019-10-06: 4 via RESPIRATORY_TRACT
  Filled 2019-10-06: qty 6.7

## 2019-10-06 MED ORDER — ASPIRIN 81 MG PO CHEW
324.0000 mg | CHEWABLE_TABLET | Freq: Once | ORAL | Status: AC
  Administered 2019-10-06: 324 mg via ORAL
  Filled 2019-10-06: qty 4

## 2019-10-06 MED ORDER — METHYLPREDNISOLONE SODIUM SUCC 125 MG IJ SOLR
125.0000 mg | Freq: Once | INTRAMUSCULAR | Status: AC
  Administered 2019-10-06: 125 mg via INTRAVENOUS
  Filled 2019-10-06: qty 2

## 2019-10-06 NOTE — ED Triage Notes (Signed)
Pt states he has been more sob than usual for the past few days with intermittent chest tightness. States the pain radiates to right side of neck.

## 2019-10-06 NOTE — ED Provider Notes (Signed)
[place] Provider Note   CSN: [id number] Arrival date & time: 10/06/19  4562     History Chief Complaint  Patient presents with  . Chest Pain  . Shortness of Breath    Bryan [name] is a 64 y.o. male.  HPI   Patient has a history of COPD and is supplemental oxygen at home.  Over the last couple weeks and more specifically over the last few days he has had some intermittent shortness of breath and chest tightness.  Patient feels like his airway is closing up in his throat.  He feels like it is hard to take a deep breath.  Gets worse with activity.  He has tightness in his chest when this happens.  He does have some discomfort in his upper abdomen and chest as well.  He denies any nausea vomiting or diarrhea.  He denies any fevers or chills or coughing.  No leg swelling.  He uses inhalers but has not noticed a big change.  He had been taking trilogy which had been helpful but he is not able to afford that.  The symptoms were getting worse and he was concerned so he came to the ED today.  Past Medical History:  Diagnosis Date  . Acid reflux   . Arthritis   . Asthma   . Chronic pain    with leg and back pain (disc problem)  . COPD (chronic obstructive pulmonary disease) ([place])   . Diabetes mellitus without complication ([place])   . Headache    HX OF  . History of upper GI x-ray series    to follow showed large duodenal ulcer H pylori serologies were negative  . Hypertension   . Pneumonia    07/17/17  . Pre-diabetes   . Stroke [facility])    TIA MINI STROKE  . Tubular adenoma     Patient Active Problem List   Diagnosis Date Noted  . Chronic diastolic CHF (congestive heart failure) ([place]) 08/03/2019  . Acute on chronic respiratory failure ([place]) 08/02/2019  . Acute exacerbation of CHF (congestive heart failure) /HFpEF (EF 60%) 06/13/2019  . Normocytic anemia 02/15/2019  . COPD exacerbation ([place]) 09/16/2018  . Hypokalemia 06/26/2018  . Hypomagnesemia 06/26/2018    . Anemia 06/26/2018  . BPH (benign prostatic hyperplasia) 06/26/2018  . Chronic respiratory failure with hypoxia ([place]) 06/04/2018  . HCAP (healthcare-associated pneumonia) 06/04/2018  . Acute respiratory failure with hypoxia ([place]) 04/14/2018  . Pulmonary nodule 04/14/2018  . CKD (chronic kidney disease), stage III 04/14/2018  . Type 2 diabetes mellitus with hyperlipidemia ([place]) 03/12/2018  . BPH with obstruction/lower urinary tract symptoms 08/17/2017  . Left sided numbness 07/22/2017  . TIA (transient ischemic attack) 07/22/2017  . Pneumonia 07/14/2017  . COPD with acute exacerbation ([place]) 07/14/2017  . Constipation 07/10/2017  . Hx of adenomatous colonic polyps 05/01/2017  . Hypertension   . Chronic pain   . Heme positive stool 10/20/2014  . Epigastric pain 06/19/2014  . Dysphagia 06/19/2014  . AP (abdominal pain) 11/13/2013  . Early satiety 11/13/2013  . COPD, severe ([place]) 02/22/2013  . Low back pain 11/19/2012  . COLITIS 05/12/2009  . ABDOMINAL PAIN, LEFT LOWER QUADRANT 05/12/2009  . DUODENAL ULCER, HX OF 05/12/2009  . H/o Alcohol abuse- Quit in 2010 05/11/2009  . GERD 05/11/2009  . Diarrhea 05/11/2009  . SPONDYLOSIS, CERVICAL 07/03/2007  . NECK PAIN 07/03/2007    Past Surgical History:  Procedure Laterality Date  . BACK SURGERY  7/05; 5/09  Dr.Hirsch,3 lumbar  X3  . BACK SURGERY    . COLONOSCOPY  Feb 2012   Dr. Gala Romney: normal rectum, pedunculate polyp removed but not recovered  . COLONOSCOPY WITH ESOPHAGOGASTRODUODENOSCOPY (EGD) N/A 11/18/2013   Dr.Rourk- tcs= normal rectum, multipe polyps about the ileocecal valve and distal transverse colon o/w the remainder of the colonic mucosa appeared normal bx= tubular adenoma. EGD= normal esophagus, stomach with scattered erosions mottling, friablility, no ulcer or infiltrating process patent pylorus bx= chronic inflammation. next TCS 11/2016  . COLONOSCOPY WITH PROPOFOL N/A 05/25/2017   Dr. Gala Romney: sigmoid diverticulosis, one 4  mm hyerplastic rectal polyp, ascending colonic AVMs surveillance 2023  . ESOPHAGOGASTRODUODENOSCOPY  1/06   Dr. Volney American esophageal erosions,U-shaped stomach,marked erosions and edema of the bulb without discrete ulcer disease.   . ESOPHAGOGASTRODUODENOSCOPY (EGD) WITH PROPOFOL N/A 05/25/2017   Dr. Gala Romney: reflux esophagitis s/p empiric dilation, normal stomach and duodenum  . MALONEY DILATION N/A 05/25/2017   Procedure: Venia Minks DILATION;  Surgeon: Daneil Dolin, MD;  Location: [facility] ENDO SUITE;  Service: Endoscopy;  Laterality: N/A;  . NECK SURGERY  10/2007   PLATE IN NECK  . POLYPECTOMY  05/25/2017   Procedure: POLYPECTOMY;  Surgeon: Daneil Dolin, MD;  Location: [facility] ENDO SUITE;  Service: Endoscopy;;  rectal  . TRANSURETHRAL RESECTION OF PROSTATE N/A 08/17/2017   Procedure: TRANSURETHRAL RESECTION OF THE PROSTATE (TURP);  Surgeon: Irine Seal, MD;  Location: [facility] ORS;  Service: Urology;  Laterality: N/A;       Family History  Problem Relation Age of Onset  . Cancer Father   . Asthma Mother   . Colon cancer Neg Hx     Social History   Tobacco Use  . Smoking status: Former Smoker    Packs/day: 0.50    Years: 40.00    Pack years: 20.00    Types: Cigarettes    Quit date: 11/11/2014    Years since quitting: 4.9  . Smokeless tobacco: Never Used  Substance Use Topics  . Alcohol use: No    Alcohol/week: 0.0 standard drinks  . Drug use: Yes    Types: Marijuana    Comment: occas    Home Medications Prior to Admission medications   Medication Sig Start Date End Date Taking? Authorizing Provider  acetaminophen (TYLENOL) 325 MG tablet Take 2 tablets (650 mg total) by mouth every 6 (six) hours as needed for mild pain (or Fever >/= 101). 06/14/19  Yes Emokpae, Courage, MD  albuterol (PROVENTIL) (2.5 MG/3ML) 0.083% nebulizer solution Take 3 mLs (2.5 mg total) by nebulization every 4 (four) hours as needed for wheezing or shortness of breath. 06/14/19  Yes Emokpae, Courage, MD  albuterol  (VENTOLIN HFA) 108 (90 Base) MCG/ACT inhaler Inhale 2 puffs into the lungs every 4 (four) hours as needed for wheezing or shortness of breath. Do not use with nebulizer 06/14/19  Yes Emokpae, Courage, MD  Fluticasone-Umeclidin-Vilant (TRELEGY ELLIPTA) 100-62.5-25 MCG/INH AEPB Inhale 1 puff into the lungs daily. 06/14/19  Yes Emokpae, Courage, MD  gabapentin (NEURONTIN) 300 MG capsule Take 1 capsule (300 mg total) by mouth 2 (two) times daily. Pt. Says he is taking twice daily. Patient taking differently: Take 600 mg by mouth daily. Pt. Says he is taking twice daily. 06/14/19  Yes Emokpae, Courage, MD  guaiFENesin (MUCINEX) 600 MG 12 hr tablet Take 1 tablet (600 mg total) by mouth 2 (two) times daily. 06/14/19 06/13/20 Yes Emokpae, Courage, MD  linaclotide (LINZESS) 72 MCG capsule Take 1 capsule (72 mcg total) by  mouth daily before breakfast. 06/14/19  Yes Emokpae, Courage, MD  OXYGEN Inhale 2.5 L into the lungs daily.   Yes [provider]  pantoprazole (PROTONIX) 40 MG tablet Take 1 tablet (40 mg total) by mouth daily before breakfast. 02/15/19  Yes [name], NP  polyethylene glycol powder (MIRALAX) 17 GM/SCOOP powder Take 17 g by mouth daily as needed (FOR CONSTIPATION).  11/14/18  Yes [provider]  doxycycline (VIBRA-TABS) 100 MG tablet Take 1 tablet (100 mg total) by mouth 2 (two) times daily. 10/06/19   Dorie Rank, MD  predniSONE (STERAPRED UNI-PAK 21 TAB) 10 MG (21) TBPK tablet Take 6 tablets (60 mg total) by mouth daily for 2 days, THEN 5 tablets (50 mg total) daily for 2 days, THEN 4 tablets (40 mg total) daily for 2 days, THEN 3 tablets (30 mg total) daily for 2 days, THEN 2 tablets (20 mg total) daily for 2 days, THEN 1 tablet (10 mg total) daily for 2 days. 10/06/19 10/18/19  Dorie Rank, MD    Allergies    Patient has no known allergies.  Review of Systems   Review of Systems  All other systems reviewed and are negative.   Physical Exam Updated Vital Signs BP (!) 149/80    Pulse 89   Temp 98.5 F (36.9 C) (Oral)   Resp (!) 25   Ht 1.6 m (_0 )   Wt 57.2 kg   SpO2 91%   BMI 22.32 kg/m   Physical Exam Vitals and nursing note reviewed.  Constitutional:      Appearance: He is well-developed. He is not ill-appearing or diaphoretic.  HENT:     Head: Normocephalic and atraumatic.     Right Ear: External ear normal.     Left Ear: External ear normal.  Eyes:     General: No scleral icterus.       Right eye: No discharge.        Left eye: No discharge.     Conjunctiva/sclera: Conjunctivae normal.  Neck:     Trachea: No tracheal deviation.  Cardiovascular:     Rate and Rhythm: Normal rate and regular rhythm.  Pulmonary:     Effort: Pulmonary effort is normal. No respiratory distress.     Breath sounds: No stridor. Decreased breath sounds present. No rales.     Comments: Faint intermittent wheeze  Abdominal:     General: Bowel sounds are normal. There is no distension.     Palpations: Abdomen is soft.     Tenderness: There is abdominal tenderness in the epigastric area. There is no guarding or rebound.  Musculoskeletal:        General: No tenderness.     Cervical back: Neck supple.     Right lower leg: No edema.     Left lower leg: No edema.  Skin:    General: Skin is warm and dry.     Findings: No rash.  Neurological:     Mental Status: He is alert.     Cranial Nerves: No cranial nerve deficit (no facial droop, extraocular movements intact, no slurred speech).     Sensory: No sensory deficit.     Motor: No abnormal muscle tone or seizure activity.     Coordination: Coordination normal.     ED Results / Procedures / Treatments   Labs (all labs ordered are listed, but only abnormal results are displayed) Labs Reviewed  CBC - Abnormal; Notable for the following components:  Result Value   MCH 25.6 (*)    MCHC 29.1 (*)    RDW 18.9 (*)    All other components within normal limits  COMPREHENSIVE METABOLIC PANEL - Abnormal; Notable  for the following components:   CO2 35 (*)    Glucose, Bld 121 (*)    Creatinine, Ser 1.39 (*)    GFR calc non Af Amer 54 (*)    All other components within normal limits  LIPASE, BLOOD - Abnormal; Notable for the following components:   Lipase 61 (*)    All other components within normal limits  URINALYSIS, ROUTINE W REFLEX MICROSCOPIC - Abnormal; Notable for the following components:   Color, Urine STRAW (*)    Protein, ur 100 (*)    All other components within normal limits  POC SARS CORONAVIRUS 2 AG -  ED  TROPONIN I (HIGH SENSITIVITY)  TROPONIN I (HIGH SENSITIVITY)    EKG EKG Interpretation  Date/Time:  Sunday October 06 2019 09:30:30 EST Ventricular Rate:  98 PR Interval:    QRS Duration: 84 QT Interval:  361 QTC Calculation: 454 R Axis:   93 Text Interpretation: Sinus rhythm Multiform ventricular premature complexes Borderline short PR interval Right axis deviation Minimal ST depression, inferior leads Minimal ST elevation, anterior leads No significant change since last tracing Confirmed by Dorie Rank [phone]) on 10/06/2019 9:37:37 AM   Radiology DG Chest Portable 1 View  Result Date: 10/06/2019 CLINICAL DATA:  Chest pain radiating to RIGHT-side of neck EXAM: PORTABLE CHEST 1 VIEW COMPARISON:  Portable exam 0950 hours compared to 08/02/2019 FINDINGS: Normal heart size, mediastinal contours, and pulmonary vascularity. Minimal linear scarring in lingula. Lungs otherwise clear. No infiltrate, pleural effusion or pneumothorax. Prior cervical fusion. IMPRESSION: No acute abnormalities. Electronically Signed   By: Lavonia Dana M.D.   On: 10/06/2019 10:06    Procedures Procedures (including critical care time)  Medications Ordered in ED Medications  aspirin chewable tablet 324 mg (324 mg Oral Given 10/06/19 1004)  methylPREDNISolone sodium succinate (SOLU-MEDROL) 125 mg/2 mL injection 125 mg (125 mg Intravenous Given 10/06/19 1006)  albuterol (VENTOLIN HFA) 108 (90 Base)  MCG/ACT inhaler 4 puff (4 puffs Inhalation Given 10/06/19 1045)  albuterol (VENTOLIN HFA) 108 (90 Base) MCG/ACT inhaler 4 puff (4 puffs Inhalation Given 10/06/19 1326)    ED Course  I have reviewed the triage vital signs and the nursing notes.  Pertinent labs & imaging results that were available during my care of the patient were reviewed by me and considered in my medical decision making (see chart for details).  Clinical Course as of Oct 05 1440  Sun Oct 06, 2019  1144 Slight increase in lipase although I doubt clinically significant   [JK]  1144 Chest x-ray without pneumonia.   [UE]  4540 Patient still with tachypnea.  Suspect symptoms may be related to COPD will try an additional albuterol neb   [JW]  1191 Reviewed imaging chest.  Patient had a CT angiogram in November.  Severe emphysema COPD   [JK]  1438 Pt is feeling better after treatment.  Oxygen stable on home o2 regimen   [JK]    Clinical Course User Index [JK] Dorie Rank, MD   MDM Rules/Calculators/A&P                      Patient's ED work-up is reassuring.  Laboratory tests are unremarkable.  Slight increase in lipase but there is does appear to be chronically elevated.  Previous  visits reviewed.  Patient did have CT scan of his chest abdomen pelvis in November with acute findings other than severe emphysema on CT. patient's oxygenation remained stable.  He is normally on home O2.  He did improved with breathing treatment and steroids.  Plan on discharge home with course of doxycycline and prednisone taper.  Discussed close follow-up with PCP.  Warning signs precautions discussed. Final Clinical Impression(s) / ED Diagnoses Final diagnoses:  COPD exacerbation ([place])    Rx / DC Orders ED Discharge Orders         Ordered    predniSONE (STERAPRED UNI-PAK 21 TAB) 10 MG (21) TBPK tablet     10/06/19 1440    doxycycline (VIBRA-TABS) 100 MG tablet  2 times daily     10/06/19 1440           Dorie Rank, MD 10/06/19  1442

## 2019-10-06 NOTE — Discharge Instructions (Addendum)
Take the steroids and antibiotics as prescribed, follow-up with your doctor this week to be rechecked.  Return as needed for worsening symptoms

## 2019-10-07 ENCOUNTER — Other Ambulatory Visit: Payer: Self-pay | Admitting: Pharmacist

## 2019-10-07 NOTE — Patient Outreach (Signed)
[facility]) Care Management  [place]   10/07/2019  Bryan [name] Apr 29, 1956 [id number]  Reason for referral: Medication Assistance with Trelegy  Referral source: [facility] RN Current insurance: [facility]  PMHx includes but not limited to:  COPD on 02 at home / asthma (former smoker 20 pack year hx), chronic pain, T2DM, HTN, hx stroke, HFpEF, BPH, CKD-III, colitis, had ED visit yesterday for worsening breathing, chest tightness, discharged with steroids and antibiotic.    Per notes, patient prescribed Trelegy and albuterol inhaler but reports difficulty affording Trelegy.  [facility] pharmacist assisted patient with successful application for FULL Extra Help in 2020, unclear if patient still has this benefit.    Outreach:  Call placed to patient's pharmacy, [facility], in [place].  Confirmed patient continues to have FULL Extra Help. Co-pay for Trelegy = $9.20 / 90 day supply, same co-pay for Albuterol.  Pharmacy will fill both inhalers for patient to have ready later today.  Both last filled on 06/14/2019 therefore patient past due for refills.   Successful telephone call with patient.  HIPAA identifiers verified. Patient reports he ran out of Trelegy last week but did not call pharmacy for refills.  He forgot that he had Full Extra Help and thought co-pay was still very expensive.  Agreeable to review medications.    Objective: The 10-year ASCVD risk score [name] DC Brooke Bonito., et al., 2013) is: 20.8%   Values used to calculate the score:     Age: 64 years     Sex: Male     Is Non-Hispanic African American: Yes     Diabetic: Yes     Tobacco smoker: No     Systolic Blood Pressure: 010 mmHg     Is BP treated: No     HDL Cholesterol: 47 mg/dL     Total Cholesterol: 164 mg/dL  Lab Results  Component Value Date   CREATININE 1.39 (H) 10/06/2019   CREATININE 1.67 (H) 08/05/2019   CREATININE 1.53 (H) 08/04/2019    Lab Results  Component Value Date   HGBA1C 6.8  (H) 08/03/2019    Lipid Panel     Component Value Date/Time   CHOL 164 07/23/2017 0403   TRIG 69 07/23/2017 0403   HDL 47 07/23/2017 0403   CHOLHDL 3.5 07/23/2017 0403   VLDL 14 07/23/2017 0403   LDLCALC 103 (H) 07/23/2017 0403    BP Readings from Last 3 Encounters:  10/06/19 (!) 149/88  08/05/19 (!) 103/2  07/30/19 109/71    No Known Allergies  Medications Reviewed Today    Reviewed by Marylouise Stacks, CPhT (Pharmacy Technician) on 08/02/19 at 1321  Med List Status: Complete  Medication Order Taking? Sig Documenting Provider Last Dose Status Informant  acetaminophen (TYLENOL) 325 MG tablet [id number] Yes Take 2 tablets (650 mg total) by mouth every 6 (six) hours as needed for mild pain (or Fever >/= 101). Roxan Hockey, MD 08/01/2019 Unknown time Active Self  albuterol (PROVENTIL) (2.5 MG/3ML) 0.083% nebulizer solution [id number] Yes Take 3 mLs (2.5 mg total) by nebulization every 4 (four) hours as needed for wheezing or shortness of breath. Roxan Hockey, MD 08/02/2019 Unknown time Active Self  albuterol (VENTOLIN HFA) 108 (90 Base) MCG/ACT inhaler [id number] Yes Inhale 2 puffs into the lungs every 4 (four) hours as needed for wheezing or shortness of breath. Do not use with nebulizer Roxan Hockey, MD 08/01/2019 Unknown time Active Self  amLODipine (NORVASC) 2.5 MG tablet [id number] Yes Take 1  tablet (2.5 mg total) by mouth daily. Roxan Hockey, MD 08/01/2019 Unknown time Active Self  Fluticasone-Umeclidin-Vilant (TRELEGY ELLIPTA) 100-62.5-25 MCG/INH AEPB [id number] Yes Inhale 1 puff into the lungs daily. Roxan Hockey, MD 08/01/2019 Unknown time Active Self  gabapentin (NEURONTIN) 300 MG capsule [id number] Yes Take 1 capsule (300 mg total) by mouth 2 (two) times daily. Pt. Says he is taking twice daily.  Patient taking differently: Take 600 mg by mouth daily. Pt. Says he is taking twice daily.   Roxan Hockey, MD 08/01/2019 Unknown time Active Self  guaiFENesin  (MUCINEX) 600 MG 12 hr tablet [id number] Yes Take 1 tablet (600 mg total) by mouth 2 (two) times daily. Roxan Hockey, MD Past Week Unknown time Active Self  linaclotide (LINZESS) 72 MCG capsule [id number] Yes Take 1 capsule (72 mcg total) by mouth daily before breakfast. Roxan Hockey, MD 07/31/2019 Active Self  OXYGEN [id number] No Inhale 2.5 L into the lungs daily. [provider] Not Taking Unknown time Consider Medication Status and Discontinue (Patient Preference) Self  pantoprazole (PROTONIX) 40 MG tablet [id number] Yes Take 1 tablet (40 mg total) by mouth daily before breakfast. [name], NP 08/01/2019 Unknown time Active Self  polyethylene glycol powder (MIRALAX) 17 GM/SCOOP powder [id number] Yes Take 17 g by mouth daily as needed (FOR CONSTIPATION).  [provider] Past Month Unknown time Active Self  potassium chloride (KLOR-CON) 10 MEQ tablet [id number] No Take 1 tablet (10 mEq total) by mouth every Tuesday, Thursday, Saturday, and Sunday.  Patient not taking: Reported on 08/02/2019   Roxan Hockey, MD Not Taking Unknown time Active Self  tamsulosin (FLOMAX) 0.4 MG CAPS capsule [id number] No Take 1 capsule (0.4 mg total) by mouth daily after supper.  Patient not taking: Reported on 08/02/2019   Roxan Hockey, MD Not Taking Unknown time Active Self          Assessment: Drugs sorted by system:  Pulmonary/Allergy: albuterol nebulizer + inhaler, Trelegy inhaler, guaifenesin  Gastrointestinal: linaclotide, pantoprazole, polytheylene glycol  Endocrine: prednisone  Pain: acetaminophen, gapabentin  Infectious Diseases: doxycycline  Medication Review Findings:   Patient reports he picked up steroid and antibiotic from pharmacy last night (after ED visit) and is taking them, confirmed dosing, reviewed steroid and abx side effects  Reviewed co-pays for inhalers and patient can afford them, encouraged him to always call pharmacy for for co-pays instead of  letting medication run out, patient voiced understanding, he will pick up inhalers later tonight  Plan: . Will route note to PCP.  Marland Kitchen Will close [facility] pharmacy case as no further medication needs identified at this time.  Am happy to assist in the future as needed.     Ralene Bathe, PharmD, [place] [phone]

## 2019-10-10 DIAGNOSIS — I10 Essential (primary) hypertension: Secondary | ICD-10-CM | POA: Diagnosis not present

## 2019-10-10 DIAGNOSIS — K219 Gastro-esophageal reflux disease without esophagitis: Secondary | ICD-10-CM | POA: Diagnosis not present

## 2019-10-14 ENCOUNTER — Encounter: Payer: Self-pay | Admitting: *Deleted

## 2019-10-14 ENCOUNTER — Other Ambulatory Visit: Payer: Self-pay | Admitting: *Deleted

## 2019-10-14 NOTE — Patient Outreach (Signed)
[facility]) Care Management  10/14/2019  Bryan [name] Mar 21, 1956 [id number]   [facility] Telephone Assessment/Screen for [facility] UM referral   Referral Date: 10/07/19  [facility] Received referral 10/08/19 (on APL) Referral Source: [place] [facility] Referral Reason: Medium referral (within 10 business day outreach) - [facility], phone [phone]** Unsure if member is still active with [facility], Mentions that he needs assistance finding an affordable place with central air and heat, He lives in a house with a fireplace and is unable to use the oil heat due to his oxygen, Is there a list that member can find or a list that can be mailed to member? Discipline requested [place]: united healthcare medicare   ED visit 10/06/19 COPD Admission 08/02/19 acute respiratory failure, 06/13/19 acute congestive Heart Failure (CHF)   Outreach attempt #1 successful  Patient is able to verify HIPAA, DOB and address Reviewed and addressed referral to [facility] with patient  Bryan [name] confirms he lives in a house that he rents from a family member that has gas He confirms he can not afford to continue live in this home and pay for gas.  He would like to move to a home with central air and heat considering his use of oxygen  He has not made any attempt to seek assistance from the new [place] discussed the new [employer] availability  Bryan [name] requests a return call in the mornings as he was presently in a public area at the time of this call    Social: Bryan [name] is a 64 year old disabled male patient who lives alone but receives assist from his son, sisters and brother. He has 2 children is independent with his care needs and transportation to  Medical appointments   Conditions: Chronic obstructive pulmonary disease (COPD) . congestive Heart Failure (CHF), Hypertension (HTN), Diabetes (DM) type 2,  BPH, CKD-III, colitis, hx stroke, former smoker (quit  11/11/2014)     DME: oxygen 3 L /Min, nebulizer, BP cuff, cbg monitor   Medications: He denies concerns with taking medications as prescribed, affording medications, side effects of medications and questions about medications   Appointments: 10/25/19 0900 Dr Jeffie Pollock 3 month urology follow 01/28/20 0930 Gastroenterologist Roseanne Kaufman NP follow up   Advance Directives: Denies need for assist with advance directives   Consent: THN RN CM reviewed [facility] services with patient. Patient gave verbal consent for services [facility] telephonic RN CM and [facility] SW    Plan: [facility] RN CM will follow up with Bryan [name] within the next 7 business days  Pt encouraged to return a call to [facility] RN CM prn  [facility] RN CM sent a successful outreach letter as discussed with [facility] brochure enclosed for review  Routed note to MDs/NP/PA  [facility] CM Care Plan Problem One     Most Recent Value  Care Plan Problem One  Housing improvement- COPD  Role Documenting the Problem One  Care Management Telephonic Coordinator  Care Plan for Problem One  Active  THN Long Term Goal   over the next 30 days patient will receive resources for improved housing beneficial to managing his COPDt  [employer] Long Term Goal Start Date  10/14/19  Interventions for Problem One Long Term Goal  Reviewed referral details, reviewed availability of the local housing authority  [facility] CM Short Term Goal #1   over the next 14 days patient will complete an application to the  local housing authority  [facility] CM Short Term Goal #1 Start Date  10/14/19  Interventions for Short Term Goal #1  Reviewed referral details, reviewed availability of the local housing authority      [place]. [name], RN, BSN, [place] Coordinator Office number [phone] Mobile number [phone]  Main THN number [phone] Fax number [phone]

## 2019-10-15 ENCOUNTER — Other Ambulatory Visit: Payer: Self-pay

## 2019-10-15 ENCOUNTER — Other Ambulatory Visit: Payer: Self-pay | Admitting: *Deleted
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
## 2019-10-22 ENCOUNTER — Other Ambulatory Visit: Payer: Self-pay

## 2019-10-22 NOTE — Patient Outreach (Signed)
[place] [facility]) Care Management  10/22/2019  Bryan [name] 09-25-1955 [id number]   Social Work referral received from [place] on 10/15/19.  "Patient needs assistance with housing. Presently living in a home requiring gas, with water leaks throughout the home, using electric heaters and a fireplace, difficulty paying electric bill related to increase cost from electric heaters and PMH of COPD on oxygen at 3 L, CHF, DM 2, stroke CKDIII. On 10/08/19 [name] referral reason mentions that he needs assistance finding an affordable place with central air and heat. He lives in a house with a fireplace and is unable to use the oil heat due to his oxygen. Is there a list that member can find or a list that can be mailed to member? [name] RNCM conference called with patient to the new [employer] and left a message requesting a return call back on 10/15/19". Successful outreach to patient today.  Patient stated that he did not receive return call from the [employer] and has attempted to contact them several more times.  Per web site, office is not open to the public due to [id number] and office should be contacted via phone.  Patient has been calling the number provided on the site with no success.   Per web site, in order to apply for housing forms must be completed and emailed to [email or web address].  Patient does not have access to printer or scanner.  Informed patient that documents can be mailed to him today.  Informed patient that LCSW, Nat Christen ,will follow up to ensure receipt.  Informed patient that completed documents can be sent to [name] if he is unable to figure out a way to email them to the housing authority.  [name] Social Architect can help with getting documents scanned and emailed if needed.  Also agreed to send patient list of available properties from [email or web address]  Patient being transferred to LCSW, Nat Christen  for follow up.    Ronn Melena, BSW Social Worker [phone]

## 2019-10-25 ENCOUNTER — Ambulatory Visit: Payer: Medicare Other | Admitting: Urology

## 2019-10-29 ENCOUNTER — Other Ambulatory Visit: Payer: Self-pay | Admitting: *Deleted

## 2019-10-29 ENCOUNTER — Other Ambulatory Visit: Payer: Self-pay
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
## 2019-10-31 ENCOUNTER — Encounter: Payer: Self-pay | Admitting: *Deleted

## 2019-10-31 ENCOUNTER — Other Ambulatory Visit: Payer: Self-pay | Admitting: *Deleted
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
## 2019-11-08 ENCOUNTER — Other Ambulatory Visit: Payer: Self-pay | Admitting: *Deleted

## 2019-11-08 ENCOUNTER — Encounter: Payer: Self-pay | Admitting: *Deleted

## 2019-11-08 NOTE — Patient Outreach (Signed)
[facility]) Care Management  11/08/2019  Bryan [name] 01-28-1956 [id number]  CSW was able to make contact with patient today to follow-up regarding social work services and resources, as well as to ensure that patient received the packet of resource information mailed to his home by CSW on Thursday, October 31, 2019, which included all of the following resources:  [employer] # [phone] ext. # [id number]; [employer] [place] - # [phone]; [employer] [place] - # [phone]; [employer] - # [phone]; [employer] [place] - # [phone]; [place] - # [phone]; [facility] [place] -  # [phone].  Patient admitted to receiving the resource information, but stated, "I ain't trying to do no repairs to this house now cause they acting like they trying to sell it".  CSW voiced understanding, as well as concern, inquiring about where patient and his son will live if his cousin decides to sell the house.  Patient indicated that he plans to complete the application for the [facility] today, just not having had time to complete it before now.  CSW inquired as to whether or not patient's son, Nyzier Boivin would be assisting him with application completion, but patient denied, reporting that he will definitely get the documents filled out and submitted today.    CSW offered to assist patient with completing the applications over the phone, just to ensure accuracy and understanding, but again, patient denied, stating, "I know how to fill out these forms, but if I have questions, I will be sure and give you a call".  CSW was able to ensure that patient has the correct contact information for CSW.  CSW reminded patient that he will need to complete all of the following documents to submit with his application to the  [place]:  Consent Form; Supplement to Application for [employer]; Declaration of Citizenship; Debts Owed to [employer], confirming that patient still has all of these documents on hand.  Last, CSW explained to patient that he will need to e-mail the completed and signed documents to [email or web address] for processing.  CSW offered to drive out to patient's home to retrieve the documents from him once they have been completed and signed, aware that patient does not have access to a printer/scanner/copier/fax machine; however, patient reported that he will get one of his sister's, Marzetta Board or Naomie Dean to fax in the documents for him, assuring CSW that both of his sister's have access to a fax machine.  CSW encouraged patient to submit these documents as soon as possible, reminding him that there is already an extensive waiting list and that the processing of applications is already delayed due to COVID-19.  CSW agreed to contact patient again next week, to have patient report findings of application completion and submission, but patient stated, "Thank you, but that won't be necessary, cause I'm gonna get it all done today".  CSW commended patient for taking the initiative to pursue his own alternate housing arrangements, encouraging patient to contact CSW directly if additional social work needs arise in the near future, or if patient requires additional social work assistance.  Patient was agreeable to this plan, very appreciative of all services and resources provided to him by [facility], thus far.  CSW will perform a case closure on patient, as all goals of treatment have been  met from social work standpoint and no additional social work needs have been identified at this time.  CSW will notify patient's RNCM with [facility] [facility], Jackelyn Poling of CSW's plans to close patient's case.  CSW  will fax an update to patient's Primary Care Physician, Dr. Rosita Fire to ensure that he is aware of CSW's involvement with patient's plan of care.  CSW will also route this note to Mrs. [name] and Dr. Legrand Rams.   Nat Christen, BSW, MSW, LCSW  Licensed Education officer, [facility]  Mailing [place] [address], [place], [place] [address] Physical Address-[address], [place], [place] [address] Toll Free Main # [phone] Fax # [phone] Cell # [phone]  Office # [phone] [email or web address]

## 2019-11-09 ENCOUNTER — Emergency Department (HOSPITAL_COMMUNITY)
Admission: EM | Admit: 2019-11-09 | Discharge: 2019-11-09 | Disposition: A | Discharge status: Home / Self Care | Payer: Medicare Other | Attending: Emergency Medicine | Admitting: Emergency Medicine

## 2019-11-09 ENCOUNTER — Encounter (HOSPITAL_COMMUNITY): Payer: Self-pay | Admitting: Emergency Medicine

## 2019-11-09 ENCOUNTER — Other Ambulatory Visit: Payer: Self-pay

## 2019-11-09 ENCOUNTER — Emergency Department (HOSPITAL_COMMUNITY): Payer: Medicare Other

## 2019-11-09 DIAGNOSIS — N183 Chronic kidney disease, stage 3 unspecified: Secondary | ICD-10-CM | POA: Insufficient documentation

## 2019-11-09 DIAGNOSIS — Z87891 Personal history of nicotine dependence: Secondary | ICD-10-CM | POA: Insufficient documentation

## 2019-11-09 DIAGNOSIS — E1122 Type 2 diabetes mellitus with diabetic chronic kidney disease: Secondary | ICD-10-CM | POA: Diagnosis not present

## 2019-11-09 DIAGNOSIS — I509 Heart failure, unspecified: Secondary | ICD-10-CM | POA: Insufficient documentation

## 2019-11-09 DIAGNOSIS — I130 Hypertensive heart and chronic kidney disease with heart failure and stage 1 through stage 4 chronic kidney disease, or unspecified chronic kidney disease: Secondary | ICD-10-CM | POA: Diagnosis not present

## 2019-11-09 DIAGNOSIS — J441 Chronic obstructive pulmonary disease with (acute) exacerbation: Secondary | ICD-10-CM | POA: Diagnosis not present

## 2019-11-09 DIAGNOSIS — Z79899 Other long term (current) drug therapy: Secondary | ICD-10-CM | POA: Insufficient documentation

## 2019-11-09 DIAGNOSIS — R0602 Shortness of breath: Secondary | ICD-10-CM | POA: Diagnosis present

## 2019-11-09 LAB — BASIC METABOLIC PANEL
Anion gap: 5 (ref 5–15)
BUN: 20 mg/dL (ref 8–23)
CO2: 33 mmol/L — ABNORMAL HIGH (ref 22–32)
Calcium: 8.7 mg/dL — ABNORMAL LOW (ref 8.9–10.3)
Chloride: 102 mmol/L (ref 98–111)
Creatinine, Ser: 1.72 mg/dL — ABNORMAL HIGH (ref 0.61–1.24)
GFR calc Af Amer: 48 mL/min — ABNORMAL LOW (ref >60)
GFR calc non Af Amer: 41 mL/min — ABNORMAL LOW (ref >60)
Glucose, Bld: 103 mg/dL — ABNORMAL HIGH (ref 70–99)
Potassium: 4.3 mmol/L (ref 3.5–5.1)
Sodium: 140 mmol/L (ref 135–145)

## 2019-11-09 LAB — CBC WITH DIFFERENTIAL/PLATELET
Abs Immature Granulocytes: 0.03 10*3/uL (ref 0.00–0.07)
Basophils Absolute: 0 10*3/uL (ref 0.0–0.1)
Basophils Relative: 0 %
Eosinophils Absolute: 0.2 10*3/uL (ref 0.0–0.5)
Eosinophils Relative: 2 %
HCT: 45.3 % (ref 39.0–52.0)
Hemoglobin: 13.7 g/dL (ref 13.0–17.0)
Immature Granulocytes: 0 %
Lymphocytes Relative: 20 %
Lymphs Abs: 1.9 10*3/uL (ref 0.7–4.0)
MCH: 26.7 pg (ref 26.0–34.0)
MCHC: 30.2 g/dL (ref 30.0–36.0)
MCV: 88.1 fL (ref 80.0–100.0)
Monocytes Absolute: 1.1 10*3/uL — ABNORMAL HIGH (ref 0.1–1.0)
Monocytes Relative: 12 %
Neutro Abs: 6 10*3/uL (ref 1.7–7.7)
Neutrophils Relative %: 66 %
Platelets: 213 10*3/uL (ref 150–400)
RBC: 5.14 MIL/uL (ref 4.22–5.81)
RDW: 20.7 % — ABNORMAL HIGH (ref 11.5–15.5)
WBC: 9.2 10*3/uL (ref 4.0–10.5)
nRBC: 0 % (ref 0.0–0.2)

## 2019-11-09 LAB — URINALYSIS, ROUTINE W REFLEX MICROSCOPIC
Bacteria, UA: NONE SEEN
Bilirubin Urine: NEGATIVE
Glucose, UA: NEGATIVE mg/dL
Hgb urine dipstick: NEGATIVE
Ketones, ur: NEGATIVE mg/dL
Leukocytes,Ua: NEGATIVE
Nitrite: NEGATIVE
Protein, ur: 100 mg/dL — AB
Specific Gravity, Urine: 1.019 (ref 1.005–1.030)
pH: 6 (ref 5.0–8.0)

## 2019-11-09 LAB — BRAIN NATRIURETIC PEPTIDE: B Natriuretic Peptide: 85 pg/mL (ref 0.0–100.0)

## 2019-11-09 LAB — TROPONIN I (HIGH SENSITIVITY): Troponin I (High Sensitivity): 19 ng/L — ABNORMAL HIGH (ref <18)

## 2019-11-09 MED ORDER — PREDNISONE 10 MG (21) PO TBPK: ORAL_TABLET | Freq: Every day | ORAL | 0 refills | Status: DC

## 2019-11-09 MED ORDER — PREDNISONE 50 MG PO TABS
60.0000 mg | ORAL_TABLET | Freq: Once | ORAL | Status: AC
  Administered 2019-11-09: 60 mg via ORAL
  Filled 2019-11-09: qty 1

## 2019-11-09 MED ORDER — IPRATROPIUM-ALBUTEROL 20-100 MCG/ACT IN AERS
1.0000 | INHALATION_SPRAY | Freq: Four times a day (QID) | RESPIRATORY_TRACT | Status: DC
  Administered 2019-11-09: 1 via RESPIRATORY_TRACT
  Filled 2019-11-09: qty 4

## 2019-11-09 NOTE — Discharge Instructions (Addendum)
You have been seen today for shortness of breath. Please read and follow all provided instructions. Return to the emergency room for worsening condition or new concerning symptoms.    Your xray and blood work did not show any signs of infection today.  1. Medications:  Prescription sent to your pharmacy for a steroid taper. You were given the first pill already today, you should start taking this tomorrow as prescribed. Continue usual home medications.  Take medications as prescribed. Please review all of the medicines and only take them if you do not have an allergy to them.   2. Treatment: rest.  3. Follow Up:  Please follow up with your pulmonologist (lung doctors) by calling the office Monday to try to schedule the next available follow up appointment.  -If you cannot follow-up with the lung doctors please follow-up with your primary care doctor for the next available appointment to be rechecked.  It is also a possibility that you have an allergic reaction to any of the medicines that you have been prescribed - Everybody reacts differently to medications and while MOST people have no trouble with most medicines, you may have a reaction such as nausea, vomiting, rash, swelling, shortness of breath. If this is the case, please stop taking the medicine immediately and contact your physician.  ?

## 2019-11-09 NOTE — ED Triage Notes (Signed)
Pt reports shortness of breath worsening over the past few days with back pain x 3 days. Last used neb this morning. Audible wheezing noted. Normally on 3L [place].

## 2019-11-09 NOTE — ED Notes (Signed)
Attempted to ambulate pt with pulse ox. Pt unable to do so at this time. PT stood to use urinal and dropped to 89% on 3L [place], pt also became extremely short of breath.

## 2019-11-09 NOTE — ED Provider Notes (Signed)
[facility] EMERGENCY DEPARTMENT Provider Note   CSN: [id number] Arrival date & time: 11/09/19  1746     History Chief Complaint  Patient presents with  . Shortness of Breath    Bryan [name] is a 64 y.o. male with past medical history significant for  COPD on 2.5L [place] at baseline,CKD stage 3, arthritis, asthma, chronic back pain, type 2 diabetes, hypertension, TIA presents to emergency department today with chief complaint of progressively worsening shortness of breath x 4 days. He is also endorsing constant mild chest tightness. He has history of similar chest tightness and is not worse with exertion. He admits it is difficult to take a deep breath. He last used his nebulizer this morning without any change in symptoms. He has noticed intermittent bilateral leg swelling. He is also reporting constant low back pain x 3 days. Pain is sharp, does not radiate. Pain is worse in the morning and improves throughout the day. Denies fall, back injury, history of similar pain. He denies fever, chills, cough, hemoptysis, diaphoresis, abdominal pain, nausea, vomiting, urinary frequency, dysuria, gross hematuria, testicular pain or swelling.   Chart review shows echo on 06/13/2019 with LVEF of 60 to 65%, severe pulmonary artery hypertension. His pulmonologist was Dr. Luan Pulling who recently retired. He does not see new pulmonology group for 1 month.  Past Medical History:  Diagnosis Date  . Acid reflux   . Arthritis   . Asthma   . Chronic pain    with leg and back pain (disc problem)  . COPD (chronic obstructive pulmonary disease) ([place])   . Diabetes mellitus without complication ([place])   . Headache    HX OF  . History of upper GI x-ray series    to follow showed large duodenal ulcer H pylori serologies were negative  . Hypertension   . Pneumonia    07/17/17  . Pre-diabetes   . Stroke [facility])    TIA MINI STROKE  . Tubular adenoma     Patient Active Problem List   Diagnosis Date Noted  . Chronic  diastolic CHF (congestive heart failure) ([place]) 08/03/2019  . Acute on chronic respiratory failure ([place]) 08/02/2019  . Acute exacerbation of CHF (congestive heart failure) /HFpEF (EF 60%) 06/13/2019  . Normocytic anemia 02/15/2019  . COPD exacerbation ([place]) 09/16/2018  . Hypokalemia 06/26/2018  . Hypomagnesemia 06/26/2018  . Anemia 06/26/2018  . BPH (benign prostatic hyperplasia) 06/26/2018  . Chronic respiratory failure with hypoxia ([place]) 06/04/2018  . HCAP (healthcare-associated pneumonia) 06/04/2018  . Acute respiratory failure with hypoxia ([place]) 04/14/2018  . Pulmonary nodule 04/14/2018  . CKD (chronic kidney disease), stage III 04/14/2018  . Type 2 diabetes mellitus with hyperlipidemia ([place]) 03/12/2018  . BPH with obstruction/lower urinary tract symptoms 08/17/2017  . Left sided numbness 07/22/2017  . TIA (transient ischemic attack) 07/22/2017  . Pneumonia 07/14/2017  . COPD with acute exacerbation ([place]) 07/14/2017  . Constipation 07/10/2017  . Hx of adenomatous colonic polyps 05/01/2017  . Hypertension   . Chronic pain   . Heme positive stool 10/20/2014  . Epigastric pain 06/19/2014  . Dysphagia 06/19/2014  . AP (abdominal pain) 11/13/2013  . Early satiety 11/13/2013  . COPD, severe ([place]) 02/22/2013  . Low back pain 11/19/2012  . COLITIS 05/12/2009  . ABDOMINAL PAIN, LEFT LOWER QUADRANT 05/12/2009  . DUODENAL ULCER, HX OF 05/12/2009  . H/o Alcohol abuse- Quit in 2010 05/11/2009  . GERD 05/11/2009  . Diarrhea 05/11/2009  . SPONDYLOSIS, CERVICAL 07/03/2007  . NECK PAIN  07/03/2007    Past Surgical History:  Procedure Laterality Date  . BACK SURGERY  7/05; 5/09    Dr.Hirsch,3 lumbar  X3  . BACK SURGERY    . COLONOSCOPY  Feb 2012   Dr. Gala Romney: normal rectum, pedunculate polyp removed but not recovered  . COLONOSCOPY WITH ESOPHAGOGASTRODUODENOSCOPY (EGD) N/A 11/18/2013   Dr.Rourk- tcs= normal rectum, multipe polyps about the ileocecal valve and distal transverse colon o/w  the remainder of the colonic mucosa appeared normal bx= tubular adenoma. EGD= normal esophagus, stomach with scattered erosions mottling, friablility, no ulcer or infiltrating process patent pylorus bx= chronic inflammation. next TCS 11/2016  . COLONOSCOPY WITH PROPOFOL N/A 05/25/2017   Dr. Gala Romney: sigmoid diverticulosis, one 4 mm hyerplastic rectal polyp, ascending colonic AVMs surveillance 2023  . ESOPHAGOGASTRODUODENOSCOPY  1/06   Dr. Volney American esophageal erosions,U-shaped stomach,marked erosions and edema of the bulb without discrete ulcer disease.   . ESOPHAGOGASTRODUODENOSCOPY (EGD) WITH PROPOFOL N/A 05/25/2017   Dr. Gala Romney: reflux esophagitis s/p empiric dilation, normal stomach and duodenum  . MALONEY DILATION N/A 05/25/2017   Procedure: Venia Minks DILATION;  Surgeon: Daneil Dolin, MD;  Location: [facility] ENDO SUITE;  Service: Endoscopy;  Laterality: N/A;  . NECK SURGERY  10/2007   PLATE IN NECK  . POLYPECTOMY  05/25/2017   Procedure: POLYPECTOMY;  Surgeon: Daneil Dolin, MD;  Location: [facility] ENDO SUITE;  Service: Endoscopy;;  rectal  . TRANSURETHRAL RESECTION OF PROSTATE N/A 08/17/2017   Procedure: TRANSURETHRAL RESECTION OF THE PROSTATE (TURP);  Surgeon: Irine Seal, MD;  Location: [facility] ORS;  Service: Urology;  Laterality: N/A;       Family History  Problem Relation Age of Onset  . Cancer Father   . Asthma Mother   . Colon cancer Neg Hx     Social History   Tobacco Use  . Smoking status: Former Smoker    Packs/day: 0.50    Years: 40.00    Pack years: 20.00    Types: Cigarettes    Quit date: 11/11/2014    Years since quitting: 4.9  . Smokeless tobacco: Never Used  Substance Use Topics  . Alcohol use: No    Alcohol/week: 0.0 standard drinks  . Drug use: Yes    Types: Marijuana    Comment: occas    Home Medications Prior to Admission medications   Medication Sig Start Date End Date Taking? Authorizing Provider  acetaminophen (TYLENOL) 325 MG tablet Take 2 tablets (650 mg total)  by mouth every 6 (six) hours as needed for mild pain (or Fever >/= 101). 06/14/19  Yes Emokpae, Courage, MD  albuterol (PROVENTIL) (2.5 MG/3ML) 0.083% nebulizer solution Take 3 mLs (2.5 mg total) by nebulization every 4 (four) hours as needed for wheezing or shortness of breath. 06/14/19  Yes Emokpae, Courage, MD  albuterol (VENTOLIN HFA) 108 (90 Base) MCG/ACT inhaler Inhale 2 puffs into the lungs every 4 (four) hours as needed for wheezing or shortness of breath. Do not use with nebulizer 06/14/19  Yes Emokpae, Courage, MD  aspirin EC 81 MG tablet Take 81 mg by mouth daily.   Yes [provider]  Fluticasone-Umeclidin-Vilant (TRELEGY ELLIPTA) 100-62.5-25 MCG/INH AEPB Inhale 1 puff into the lungs daily. 06/14/19  Yes Emokpae, Courage, MD  gabapentin (NEURONTIN) 300 MG capsule Take 1 capsule (300 mg total) by mouth 2 (two) times daily. Pt. Says he is taking twice daily. Patient taking differently: Take 300 mg by mouth 2 (two) times daily.  06/14/19  Yes Roxan Hockey, MD  guaiFENesin [facility])  600 MG 12 hr tablet Take 1 tablet (600 mg total) by mouth 2 (two) times daily. Patient taking differently: Take 600 mg by mouth daily.  06/14/19 06/13/20 Yes Roxan Hockey, MD  linaclotide (LINZESS) 72 MCG capsule Take 1 capsule (72 mcg total) by mouth daily before breakfast. 06/14/19  Yes Emokpae, Courage, MD  OXYGEN Inhale 2.5 L into the lungs daily.   Yes [provider]  pantoprazole (PROTONIX) 40 MG tablet Take 1 tablet (40 mg total) by mouth daily before breakfast. Patient taking differently: Take 40 mg by mouth 2 (two) times daily.  02/15/19  Yes [name], NP  polyethylene glycol powder (MIRALAX) 17 GM/SCOOP powder Take 17 g by mouth daily as needed (FOR CONSTIPATION).  11/14/18  Yes [provider]  predniSONE (STERAPRED UNI-PAK 21 TAB) 10 MG (21) TBPK tablet Take by mouth daily. Take 6 tabs by mouth daily  for 2 days, then 5 tabs for 2 days, then 4 tabs for 2 days, then 3 tabs  for 2 days, 2 tabs for 2 days, then 1 tab by mouth daily for 2 days 11/09/19   Lavette Yankovich, Verline Lema E, PA-C    Allergies    Patient has no known allergies.  Review of Systems   Review of Systems All other systems are reviewed and are negative for acute change except as noted in the HPI.  Physical Exam Updated Vital Signs BP (!) 156/101   Pulse (!) 106   Temp 98.6 F (37 C) (Oral)   Resp (!) 24   Ht _0  (1.676 m)   Wt 57.2 kg   SpO2 91%   BMI 20.35 kg/m   Physical Exam Vitals and nursing note reviewed.  Constitutional:      General: He is not in acute distress.    Appearance: He is not ill-appearing.  HENT:     Head: Normocephalic and atraumatic.     Right Ear: Tympanic membrane and external ear normal.     Left Ear: Tympanic membrane and external ear normal.     Nose: Nose normal.     Mouth/Throat:     Mouth: Mucous membranes are moist.     Pharynx: Oropharynx is clear.  Eyes:     General: No scleral icterus.       Right eye: No discharge.        Left eye: No discharge.     Extraocular Movements: Extraocular movements intact.     Conjunctiva/sclera: Conjunctivae normal.     Pupils: Pupils are equal, round, and reactive to light.  Neck:     Vascular: No JVD.  Cardiovascular:     Rate and Rhythm: Regular rhythm. Tachycardia present.     Pulses: Normal pulses.          Radial pulses are 2+ on the right side and 2+ on the left side.     Heart sounds: Normal heart sounds.     Comments: HR ranging from 97-101 during exam Pulmonary:     Comments: Patient is tachypneic. SpO2 is 94% on 2.5 L [place].  Symmetric chest rise. Lung sounds diminished throughout. He does have rales in posterior bilateral lung bases that cleared after coughing.  Chest:     Chest wall: No tenderness.  Abdominal:     Comments: Abdomen is soft, non-distended, and non-tender in all quadrants. No rigidity, no guarding. No peritoneal signs.  Musculoskeletal:        General: Normal range of motion.      Cervical  back: Normal range of motion.     Right lower leg: No edema.     Left lower leg: No edema.     Comments: Full range of motion of the T-spine and L-spine No tenderness to palpation of the spinous processes of the T-spine or L-spine No crepitus, deformity or step-offs No tenderness to palpation of the paraspinous muscles of the L-spine  Negative straight leg raise test bilaterally.  Skin:    General: Skin is warm and dry.     Capillary Refill: Capillary refill takes less than 2 seconds.  Neurological:     Mental Status: He is oriented to person, place, and time.     GCS: GCS eye subscore is 4. GCS verbal subscore is 5. GCS motor subscore is 6.     Comments: Fluent speech, no facial droop.  Psychiatric:        Behavior: Behavior normal.       ED Results / Procedures / Treatments   Labs (all labs ordered are listed, but only abnormal results are displayed) Labs Reviewed  BASIC METABOLIC PANEL - Abnormal; Notable for the following components:      Result Value   CO2 33 (*)    Glucose, Bld 103 (*)    Creatinine, Ser 1.72 (*)    Calcium 8.7 (*)    GFR calc non Af Amer 41 (*)    GFR calc Af Amer 48 (*)    All other components within normal limits  CBC WITH DIFFERENTIAL/PLATELET - Abnormal; Notable for the following components:   RDW 20.7 (*)    Monocytes Absolute 1.1 (*)    All other components within normal limits  URINALYSIS, ROUTINE W REFLEX MICROSCOPIC - Abnormal; Notable for the following components:   Protein, ur 100 (*)    All other components within normal limits  TROPONIN I (HIGH SENSITIVITY) - Abnormal; Notable for the following components:   Troponin I (High Sensitivity) 19 (*)    All other components within normal limits  BRAIN NATRIURETIC PEPTIDE    EKG EKG Interpretation  Date/Time:  Saturday November 09 2019 17:59:23 EST Ventricular Rate:  98 PR Interval:    QRS Duration: 72 QT Interval:  340 QTC Calculation: 435 R Axis:   89 Text  Interpretation: Sinus rhythm Short PR interval Borderline right axis deviation since last tracing no significant change Confirmed by Noemi Chapel [id number]) on 11/09/2019 6:10:48 PM   Radiology DG Chest Portable 1 View  Result Date: 11/09/2019 CLINICAL DATA:  Shortness of breath.  COPD. EXAM: PORTABLE CHEST 1 VIEW COMPARISON:  Chest x-ray dated 10/06/2019 FINDINGS: Again noted are emphysematous changes bilaterally. There is no pneumothorax. No large pleural effusion. No focal infiltrate. There is no acute osseous abnormality. The heart size is stable from prior study. IMPRESSION: 1. No acute cardiopulmonary process. 2. Again noted are findings consistent with COPD. Electronically Signed   By: Constance Holster M.D.   On: 11/09/2019 18:33    Procedures Procedures (including critical care time)  Medications Ordered in ED Medications  Ipratropium-Albuterol (COMBIVENT) respimat 1 puff (1 puff Inhalation Given 11/09/19 1829)  predniSONE (DELTASONE) tablet 60 mg (60 mg Oral Given 11/09/19 1809)    ED Course  I have reviewed the triage vital signs and the nursing notes.  Pertinent labs & imaging results that were available during my care of the patient were reviewed by me and considered in my medical decision making (see chart for details).    MDM Rules/Calculators/A&P  Patient seen and examined. Patient presents awake, alert, hemodynamically stable, afebrile, non toxic. He is mildly tachycardic 106, tachypneic, and hypoxic to 91% on 3L in triage. On my exam HR 99-101, SpO2 is 95%, still tachypneic. His lung sounds are diminished throughout, he has rales heard in bilateral lung bases that cleared after coughing. No leg swelling appreciated. Back pain seems consistent with MKS etiology, presentation no consistent with kidney stone, no urinary symptoms, will check UA. Patient reports to me that symptoms worsened in the last 4 days, but when talking to ED attending patient voiced  frustration in constant need for oxygen.   Patient given PO prednisone 60 mg and combivent inhaler. On reassessment he looks to be more comfortable, tachypenia improved.  CBC without leukocytosis, no anemia. BMP without severe electrolyte derangement.  Creatinine slightly up at 1.72, but it looks like patient is similar to baseline based on previous labs. BNP is within normal range. Troponin 19. EKG is without ischemic changes, no STEMI. Patient has no chest pain currently. ACS felt to be unlikely case of chest tightness. I viewed pt's chest xray and it does not suggest acute infectious processes. UA without signs of infection, no blood to suggest kidney stone.  Patient laying on stretcher, SpO2 on 3L 96%. He stood up to ambulate and was hypoxic to 88-89%. He was able to sit down and catch his breath.   Case discussed with on call pulmonologist Dr. Lucile Shutters who recommends symptomatic treatment with steroid taper and recommends patient call pulm office to try to schedule closer follow up. No indications for infectious etiology or need for antibiotic coverage.  The patient appears reasonably screened and/or stabilized for discharge and I doubt any other medical condition or other [facility] requiring further screening, evaluation, or treatment in the ED at this time prior to discharge. The patient is safe for discharge with strict return precautions discussed.The patient was discussed with and seen by Dr. Sabra Heck who agrees with the treatment plan.  Portions of this note were generated with [employer]. Dictation errors may occur despite best attempts at proofreading.    Final Clinical Impression(s) / ED Diagnoses Final diagnoses:  COPD exacerbation ([place])    Rx / DC Orders ED Discharge Orders         Ordered    predniSONE (STERAPRED UNI-PAK 21 TAB) 10 MG (21) TBPK tablet  Daily     11/09/19 1957           Flint Melter 11/09/19 2010    Noemi Chapel, MD 11/09/19 2020

## 2019-11-11 DIAGNOSIS — I214 Non-ST elevation (NSTEMI) myocardial infarction: Secondary | ICD-10-CM

## 2019-11-11 HISTORY — DX: Non-ST elevation (NSTEMI) myocardial infarction: I21.4

## 2019-11-12 ENCOUNTER — Other Ambulatory Visit: Payer: Self-pay | Admitting: *Deleted

## 2019-11-12 ENCOUNTER — Other Ambulatory Visit: Payer: Self-pay

## 2019-11-12 NOTE — Patient Outreach (Signed)
[facility]) Care Management  11/12/2019  Bryan [name] April 26, 1956 [id number]   THN follow up outreach to complex care patient  [facility] RN CM called Bryan [name]'s phone but his voice mail box is full and [name] was not able to leave a message  He did notice the incoming call and returned a call to [name] Patient is able to verify HIPAA (Bryan [name] and Accountability Act) identifiers, date of birth (DOB) and address [facility] RN CM discussed the purpose of the call was to follow up with him after his recent ED Visit (11/09/19) for COPD exacerbation     Transition of care  He confirms he has his medications but not follow appointments as recommended on his ED after summary/discharge sheets with his primary care provider (PCP) nor pulmonologist He confirms his last pulmonologist Dr Susann Givens retired and he reports he is pending an appointment in April 2021 with a new pulmonologist but is not able to state the MD's name  Transition of care assessment completed with the only identified need for follow up appointments with his primary care provider (PCP) and pulmonology  COPD with assessment he denies smoking but reports the home he rents from a family member is old and he uses a wood burning fireplace for heat that triggers his worsening symptoms.  [facility]N SW completed and he has been offered resources by 2 [facility] SWs He reports he prefers not to live in an apartment [facility] RN CM encouraged him to consider a lower level apartment related to long waiting lists for homes on the housing list. He states he just recently complied with [facility] SW recommendation to complete housing forms and returning them. [facility] RN CM recommends he outreach to the housing authority office weekly.   THN RN CM called the office of Dr Luan Pulling. The office is closed but [facility] RN CM was able to leave a message for the office manager and requested a return call   Appointments PCP Pt report he will make a follow up  appointment April 2021 [place] RN CM will follow up with Bryan [name] within the next 7-14 business days  Pt encouraged to return a call to [facility] RN CM prn  Routed note to MDs/NP/PA  . THN CM Care Plan Problem One     Most Recent Value  Care Plan Problem One  Housing improvement- COPD  Role Documenting the Problem One  Care Management Telephonic Coordinator  Care Plan for Problem One  Active  THN Long Term Goal   over the next 30 days patient will receive resources for improved housing beneficial to managing his COPDt  [facility] Long Term Goal Start Date  10/14/19  [facility] Long Term Goal Met Date  11/13/19  THN CM Short Term Goal #1   over the next 14 days patient will complete an application to the local housing authority  [facility] CM Short Term Goal #1 Start Date  10/14/19  [facility] CM Short Term Goal #1 Met Date  11/13/19    [facility] CM Care Plan Problem Two     Most Recent Value  Care Plan Problem Two  Risk for worsening symptoms of COPD, HTN, DM   Role Documenting the Problem Two  Care Management Telephonic Coordinator  Care Plan for Problem Two  Active  Interventions for Problem Two Long Term Goal   transitoin of care and COPD assessment, encouraged to follow up with pcp, pulmonologist and local housing staff  asssessed for triggers and worsening s/s of COPD to avoid and report to MD  assessed use of COPD medications  THN Long Term Goal  Over the next 45 days, patient will not be hospitalized for complications related to COPD, DM & HTN   THN Long Term Goal Start Date  11/12/19  [facility] CM Short Term Goal #1   over the next 21 days patient will be able to obtain extra oxygen tanks to prevent worsening symptoms of COPD as evidence by verbalizaton on next contact that DME received   THN CM Short Term Goal #1 Start Date  10/29/19  [facility] CM Short Term Goal #1 Met Date   11/13/19       [name], RN, BSN, [place] Coordinator Office number [phone] Mobile  number [phone]  Main THN number [phone] Fax number [phone]

## 2019-11-13 ENCOUNTER — Encounter: Payer: Self-pay | Admitting: *Deleted

## 2019-11-23 ENCOUNTER — Encounter (HOSPITAL_COMMUNITY): Payer: Self-pay | Admitting: Emergency Medicine

## 2019-11-23 ENCOUNTER — Emergency Department (HOSPITAL_COMMUNITY): Payer: Medicare Other

## 2019-11-23 ENCOUNTER — Other Ambulatory Visit: Payer: Self-pay

## 2019-11-23 ENCOUNTER — Inpatient Hospital Stay (HOSPITAL_COMMUNITY)
Admission: EM | Admit: 2019-11-23 | Discharge: 2019-12-04 | DRG: 280 | Disposition: A | Discharge status: Home / Self Care | Payer: Medicare Other | Attending: Internal Medicine | Admitting: Internal Medicine

## 2019-11-23 DIAGNOSIS — M549 Dorsalgia, unspecified: Secondary | ICD-10-CM | POA: Diagnosis present

## 2019-11-23 DIAGNOSIS — I21A1 Myocardial infarction type 2: Principal | ICD-10-CM | POA: Diagnosis present

## 2019-11-23 DIAGNOSIS — J449 Chronic obstructive pulmonary disease, unspecified: Secondary | ICD-10-CM | POA: Diagnosis present

## 2019-11-23 DIAGNOSIS — K269 Duodenal ulcer, unspecified as acute or chronic, without hemorrhage or perforation: Secondary | ICD-10-CM | POA: Diagnosis not present

## 2019-11-23 DIAGNOSIS — E875 Hyperkalemia: Secondary | ICD-10-CM | POA: Diagnosis not present

## 2019-11-23 DIAGNOSIS — I2721 Secondary pulmonary arterial hypertension: Secondary | ICD-10-CM | POA: Diagnosis not present

## 2019-11-23 DIAGNOSIS — J9621 Acute and chronic respiratory failure with hypoxia: Secondary | ICD-10-CM | POA: Diagnosis present

## 2019-11-23 DIAGNOSIS — N1831 Chronic kidney disease, stage 3a: Secondary | ICD-10-CM | POA: Diagnosis present

## 2019-11-23 DIAGNOSIS — I130 Hypertensive heart and chronic kidney disease with heart failure and stage 1 through stage 4 chronic kidney disease, or unspecified chronic kidney disease: Secondary | ICD-10-CM | POA: Diagnosis present

## 2019-11-23 DIAGNOSIS — N179 Acute kidney failure, unspecified: Secondary | ICD-10-CM | POA: Diagnosis not present

## 2019-11-23 DIAGNOSIS — R0600 Dyspnea, unspecified: Secondary | ICD-10-CM

## 2019-11-23 DIAGNOSIS — Z9981 Dependence on supplemental oxygen: Secondary | ICD-10-CM

## 2019-11-23 DIAGNOSIS — I2723 Pulmonary hypertension due to lung diseases and hypoxia: Secondary | ICD-10-CM

## 2019-11-23 DIAGNOSIS — M79606 Pain in leg, unspecified: Secondary | ICD-10-CM | POA: Diagnosis present

## 2019-11-23 DIAGNOSIS — I129 Hypertensive chronic kidney disease with stage 1 through stage 4 chronic kidney disease, or unspecified chronic kidney disease: Secondary | ICD-10-CM | POA: Diagnosis present

## 2019-11-23 DIAGNOSIS — N183 Hypertensive chronic kidney disease with stage 1 through stage 4 chronic kidney disease, or unspecified chronic kidney disease: Secondary | ICD-10-CM | POA: Diagnosis present

## 2019-11-23 DIAGNOSIS — I5033 Acute on chronic diastolic (congestive) heart failure: Secondary | ICD-10-CM | POA: Diagnosis present

## 2019-11-23 DIAGNOSIS — Z8673 Personal history of transient ischemic attack (TIA), and cerebral infarction without residual deficits: Secondary | ICD-10-CM

## 2019-11-23 DIAGNOSIS — I214 Non-ST elevation (NSTEMI) myocardial infarction: Secondary | ICD-10-CM | POA: Diagnosis present

## 2019-11-23 DIAGNOSIS — K259 Gastric ulcer, unspecified as acute or chronic, without hemorrhage or perforation: Secondary | ICD-10-CM | POA: Diagnosis not present

## 2019-11-23 DIAGNOSIS — K2970 Gastritis, unspecified, without bleeding: Secondary | ICD-10-CM | POA: Diagnosis present

## 2019-11-23 DIAGNOSIS — G8929 Other chronic pain: Secondary | ICD-10-CM | POA: Diagnosis present

## 2019-11-23 DIAGNOSIS — E869 Volume depletion, unspecified: Secondary | ICD-10-CM | POA: Diagnosis not present

## 2019-11-23 DIAGNOSIS — K315 Obstruction of duodenum: Secondary | ICD-10-CM | POA: Diagnosis present

## 2019-11-23 DIAGNOSIS — M1990 Unspecified osteoarthritis, unspecified site: Secondary | ICD-10-CM | POA: Diagnosis present

## 2019-11-23 DIAGNOSIS — E1122 Type 2 diabetes mellitus with diabetic chronic kidney disease: Secondary | ICD-10-CM | POA: Diagnosis not present

## 2019-11-23 DIAGNOSIS — Z20822 Contact with and (suspected) exposure to covid-19: Secondary | ICD-10-CM | POA: Diagnosis not present

## 2019-11-23 DIAGNOSIS — Z825 Family history of asthma and other chronic lower respiratory diseases: Secondary | ICD-10-CM

## 2019-11-23 DIAGNOSIS — R072 Precordial pain: Secondary | ICD-10-CM | POA: Diagnosis not present

## 2019-11-23 DIAGNOSIS — Z87891 Personal history of nicotine dependence: Secondary | ICD-10-CM

## 2019-11-23 DIAGNOSIS — F1010 Alcohol abuse, uncomplicated: Secondary | ICD-10-CM | POA: Diagnosis present

## 2019-11-23 DIAGNOSIS — K311 Adult hypertrophic pyloric stenosis: Secondary | ICD-10-CM

## 2019-11-23 DIAGNOSIS — E119 Type 2 diabetes mellitus without complications: Secondary | ICD-10-CM

## 2019-11-23 DIAGNOSIS — I2510 Atherosclerotic heart disease of native coronary artery without angina pectoris: Secondary | ICD-10-CM | POA: Diagnosis not present

## 2019-11-23 DIAGNOSIS — K449 Diaphragmatic hernia without obstruction or gangrene: Secondary | ICD-10-CM | POA: Diagnosis present

## 2019-11-23 DIAGNOSIS — Z0189 Encounter for other specified special examinations: Secondary | ICD-10-CM

## 2019-11-23 DIAGNOSIS — E782 Mixed hyperlipidemia: Secondary | ICD-10-CM

## 2019-11-23 DIAGNOSIS — I209 Angina pectoris, unspecified: Secondary | ICD-10-CM

## 2019-11-23 DIAGNOSIS — Z7951 Long term (current) use of inhaled steroids: Secondary | ICD-10-CM

## 2019-11-23 DIAGNOSIS — K529 Noninfective gastroenteritis and colitis, unspecified: Secondary | ICD-10-CM | POA: Diagnosis not present

## 2019-11-23 DIAGNOSIS — E1165 Type 2 diabetes mellitus with hyperglycemia: Secondary | ICD-10-CM | POA: Diagnosis present

## 2019-11-23 DIAGNOSIS — K2100 Gastro-esophageal reflux disease with esophagitis, without bleeding: Secondary | ICD-10-CM | POA: Diagnosis present

## 2019-11-23 DIAGNOSIS — R0602 Shortness of breath: Secondary | ICD-10-CM

## 2019-11-23 DIAGNOSIS — D62 Acute posthemorrhagic anemia: Secondary | ICD-10-CM | POA: Diagnosis not present

## 2019-11-23 DIAGNOSIS — Z8701 Personal history of pneumonia (recurrent): Secondary | ICD-10-CM

## 2019-11-23 DIAGNOSIS — Z79899 Other long term (current) drug therapy: Secondary | ICD-10-CM

## 2019-11-23 DIAGNOSIS — Z9119 Patient's noncompliance with other medical treatment and regimen: Secondary | ICD-10-CM

## 2019-11-23 DIAGNOSIS — N400 Enlarged prostate without lower urinary tract symptoms: Secondary | ICD-10-CM | POA: Diagnosis present

## 2019-11-23 DIAGNOSIS — Z560 Unemployment, unspecified: Secondary | ICD-10-CM

## 2019-11-23 DIAGNOSIS — Z7982 Long term (current) use of aspirin: Secondary | ICD-10-CM

## 2019-11-23 DIAGNOSIS — R1310 Dysphagia, unspecified: Secondary | ICD-10-CM | POA: Diagnosis present

## 2019-11-23 LAB — COMPREHENSIVE METABOLIC PANEL
ALT: 26 U/L (ref 0–44)
AST: 24 U/L (ref 15–41)
Albumin: 3.4 g/dL — ABNORMAL LOW (ref 3.5–5.0)
Alkaline Phosphatase: 74 U/L (ref 38–126)
Anion gap: 7 (ref 5–15)
BUN: 21 mg/dL (ref 8–23)
CO2: 32 mmol/L (ref 22–32)
Calcium: 8.9 mg/dL (ref 8.9–10.3)
Chloride: 101 mmol/L (ref 98–111)
Creatinine, Ser: 1.59 mg/dL — ABNORMAL HIGH (ref 0.61–1.24)
GFR calc Af Amer: 53 mL/min — ABNORMAL LOW (ref >60)
GFR calc non Af Amer: 46 mL/min — ABNORMAL LOW (ref >60)
Glucose, Bld: 102 mg/dL — ABNORMAL HIGH (ref 70–99)
Potassium: 4.7 mmol/L (ref 3.5–5.1)
Sodium: 140 mmol/L (ref 135–145)
Total Bilirubin: 0.5 mg/dL (ref 0.3–1.2)
Total Protein: 7 g/dL (ref 6.5–8.1)

## 2019-11-23 LAB — CBC WITH DIFFERENTIAL/PLATELET
Abs Immature Granulocytes: 0.08 10*3/uL — ABNORMAL HIGH (ref 0.00–0.07)
Basophils Absolute: 0.1 10*3/uL (ref 0.0–0.1)
Basophils Relative: 0 %
Eosinophils Absolute: 0.2 10*3/uL (ref 0.0–0.5)
Eosinophils Relative: 1 %
HCT: 46.1 % (ref 39.0–52.0)
Hemoglobin: 14.2 g/dL (ref 13.0–17.0)
Immature Granulocytes: 1 %
Lymphocytes Relative: 20 %
Lymphs Abs: 2.5 10*3/uL (ref 0.7–4.0)
MCH: 27 pg (ref 26.0–34.0)
MCHC: 30.8 g/dL (ref 30.0–36.0)
MCV: 87.8 fL (ref 80.0–100.0)
Monocytes Absolute: 1.2 10*3/uL — ABNORMAL HIGH (ref 0.1–1.0)
Monocytes Relative: 9 %
Neutro Abs: 8.7 10*3/uL — ABNORMAL HIGH (ref 1.7–7.7)
Neutrophils Relative %: 69 %
Platelets: 179 10*3/uL (ref 150–400)
RBC: 5.25 MIL/uL (ref 4.22–5.81)
RDW: 20.4 % — ABNORMAL HIGH (ref 11.5–15.5)
WBC: 12.7 10*3/uL — ABNORMAL HIGH (ref 4.0–10.5)
nRBC: 0 % (ref 0.0–0.2)

## 2019-11-23 LAB — CBG MONITORING, ED: Glucose-Capillary: 161 mg/dL — ABNORMAL HIGH (ref 70–99)

## 2019-11-23 LAB — RESPIRATORY PANEL BY RT PCR (FLU A&B, COVID)
Influenza A by PCR: NEGATIVE
Influenza B by PCR: NEGATIVE
SARS Coronavirus 2 by RT PCR: NEGATIVE

## 2019-11-23 LAB — TROPONIN I (HIGH SENSITIVITY)
Troponin I (High Sensitivity): 28 ng/L — ABNORMAL HIGH (ref <18)
Troponin I (High Sensitivity): 29 ng/L — ABNORMAL HIGH (ref <18)
Troponin I (High Sensitivity): 32 ng/L — ABNORMAL HIGH (ref <18)

## 2019-11-23 LAB — POC SARS CORONAVIRUS 2 AG -  ED: SARS Coronavirus 2 Ag: NEGATIVE

## 2019-11-23 LAB — BRAIN NATRIURETIC PEPTIDE: B Natriuretic Peptide: 105 pg/mL — ABNORMAL HIGH (ref 0.0–100.0)

## 2019-11-23 MED ORDER — HYDRALAZINE HCL 20 MG/ML IJ SOLN
10.0000 mg | Freq: Once | INTRAMUSCULAR | Status: AC
  Administered 2019-11-23: 10 mg via INTRAVENOUS
  Filled 2019-11-23: qty 1

## 2019-11-23 MED ORDER — IPRATROPIUM-ALBUTEROL 0.5-2.5 (3) MG/3ML IN SOLN: 3.0000 mL | RESPIRATORY_TRACT | Status: DC | PRN

## 2019-11-23 MED ORDER — HEPARIN (PORCINE) 25000 UT/250ML-% IV SOLN
650.0000 [IU]/h | INTRAVENOUS | Status: AC
  Administered 2019-11-23: 21:00:00 650 [IU]/h via INTRAVENOUS
  Filled 2019-11-23 (×2): qty 250

## 2019-11-23 MED ORDER — ONDANSETRON HCL 4 MG/2ML IJ SOLN
4.0000 mg | Freq: Four times a day (QID) | INTRAMUSCULAR | Status: DC | PRN
  Administered 2019-11-24 – 2019-11-27 (×2): 4 mg via INTRAVENOUS
  Filled 2019-11-23 (×2): qty 2

## 2019-11-23 MED ORDER — HYDRALAZINE HCL 20 MG/ML IJ SOLN: 10.0000 mg | Freq: Three times a day (TID) | INTRAMUSCULAR | Status: DC | PRN

## 2019-11-23 MED ORDER — LORAZEPAM 2 MG/ML IJ SOLN
0.5000 mg | Freq: Once | INTRAMUSCULAR | Status: AC
  Administered 2019-11-23: 0.5 mg via INTRAVENOUS
  Filled 2019-11-23: qty 1

## 2019-11-23 MED ORDER — IPRATROPIUM-ALBUTEROL 20-100 MCG/ACT IN AERS
2.0000 | INHALATION_SPRAY | Freq: Once | RESPIRATORY_TRACT | Status: AC
  Administered 2019-11-23: 19:00:00 2 via RESPIRATORY_TRACT
  Filled 2019-11-23: qty 4

## 2019-11-23 MED ORDER — HEPARIN BOLUS VIA INFUSION
3350.0000 [IU] | Freq: Once | INTRAVENOUS | Status: AC
  Administered 2019-11-23: 3350 [IU] via INTRAVENOUS

## 2019-11-23 MED ORDER — IPRATROPIUM-ALBUTEROL 0.5-2.5 (3) MG/3ML IN SOLN: 3.0000 mL | Freq: Once | RESPIRATORY_TRACT | Status: DC

## 2019-11-23 MED ORDER — METHYLPREDNISOLONE SODIUM SUCC 125 MG IJ SOLR
125.0000 mg | Freq: Once | INTRAMUSCULAR | Status: AC
  Administered 2019-11-23: 125 mg via INTRAVENOUS
  Filled 2019-11-23: qty 2

## 2019-11-23 MED ORDER — ACETAMINOPHEN 325 MG PO TABS
650.0000 mg | ORAL_TABLET | ORAL | Status: DC | PRN
  Administered 2019-11-26 (×2): 650 mg via ORAL
  Filled 2019-11-23 (×4): qty 2

## 2019-11-23 MED ORDER — NITROGLYCERIN 0.4 MG SL SUBL: 0.4000 mg | SUBLINGUAL_TABLET | SUBLINGUAL | Status: DC | PRN

## 2019-11-23 MED ORDER — IOHEXOL 350 MG/ML SOLN
100.0000 mL | Freq: Once | INTRAVENOUS | Status: AC | PRN
  Administered 2019-11-23: 100 mL via INTRAVENOUS

## 2019-11-23 MED ORDER — ASPIRIN 81 MG PO CHEW
243.0000 mg | CHEWABLE_TABLET | Freq: Once | ORAL | Status: AC
  Administered 2019-11-23: 21:00:00 243 mg via ORAL
  Filled 2019-11-23: qty 3

## 2019-11-23 NOTE — Progress Notes (Signed)
ANTICOAGULATION CONSULT NOTE - Initial Consult  Pharmacy Consult for heparin Indication: chest pain/ACS  No Known Allergies  Patient Measurements: Height: _0  (160 cm) Weight: 125 lb (56.7 kg) IBW/kg (Calculated) : 56.9 Heparin Dosing Weight: 56.7 kg  Vital Signs: BP: 166/94 (03/13 2000) Pulse Rate: 98 (03/13 2000)  Labs: Recent Labs    11/23/19 1728  HGB 14.2  HCT 46.1  PLT 179  CREATININE 1.59*  TROPONINIHS 28*    Estimated Creatinine Clearance: 38.1 mL/min (A) (by C-G formula based on SCr of 1.59 mg/dL (H)).   Medical History: Past Medical History:  Diagnosis Date  . Acid reflux   . Arthritis   . Asthma   . Chronic pain    with leg and back pain (disc problem)  . COPD (chronic obstructive pulmonary disease) ([place])   . Diabetes mellitus without complication ([place])   . Headache    HX OF  . History of upper GI x-ray series    to follow showed large duodenal ulcer H pylori serologies were negative  . Hypertension   . Pneumonia    07/17/17  . Pre-diabetes   . Stroke [facility])    TIA MINI STROKE  . Tubular adenoma     Assessment: 15 yom presenting with worsening chest pain with deep breathing, hx COPD. No AC PTA.   Hgb 14, plt 179. Trop 28. No s/sx of bleeding noted.   Goal of Therapy:  Heparin level 0.3-0.7 units/ml Monitor platelets by anticoagulation protocol: Yes   Plan:  Give 3350 units bolus x 1 Start heparin infusion at 650  units/hr Check anti-Xa level in 6 hours and daily while on heparin Continue to monitor H&H and platelets  Margot Ables, PharmD Clinical Pharmacist 11/23/2019 9:11 PM

## 2019-11-23 NOTE — ED Notes (Signed)
Pt very anxious, allowing support person for Pt.

## 2019-11-23 NOTE — ED Provider Notes (Signed)
Emergency Department Provider Note   I have reviewed the triage vital signs and the nursing notes.   HISTORY  Chief Complaint Chest Pain   HPI Bryan [name] is a 64 y.o. male with PMH of COPD on 2 L home O2, HTN, DM, and arthritis presents to the emergency department for evaluation of shortness of breath with central chest tightness.  Symptoms have been worsening over the past 2 to 3 days.  He denies fever but states he has had  occasional chills.  He feels like it is difficult to take a deep breath but denies worsening pain with deep breathing.  He was seen in late February with in his words similar symptoms and did not find significant improvement with prednisone taper and outpatient COPD treatment.  He denies any sick contacts.  Denies prior history of DVT/PE.  Patient also describing some diffuse headache without vision changes.  No acute onset, maximal intensity headache symptoms. No neck pain/stiffness.   Past Medical History:  Diagnosis Date  . Acid reflux   . Arthritis   . Asthma   . Chronic pain    with leg and back pain (disc problem)  . COPD (chronic obstructive pulmonary disease) ([place])   . Diabetes mellitus without complication ([place])   . Headache    HX OF  . History of upper GI x-ray series    to follow showed large duodenal ulcer H pylori serologies were negative  . Hypertension   . Pneumonia    07/17/17  . Pre-diabetes   . Stroke [facility])    TIA MINI STROKE  . Tubular adenoma     Patient Active Problem List   Diagnosis Date Noted  . Chronic diastolic CHF (congestive heart failure) ([place]) 08/03/2019  . Acute on chronic respiratory failure ([place]) 08/02/2019  . Acute exacerbation of CHF (congestive heart failure) /HFpEF (EF 60%) 06/13/2019  . Normocytic anemia 02/15/2019  . COPD exacerbation ([place]) 09/16/2018  . Hypokalemia 06/26/2018  . Hypomagnesemia 06/26/2018  . Anemia 06/26/2018  . BPH (benign prostatic hyperplasia) 06/26/2018  . Chronic respiratory failure  with hypoxia ([place]) 06/04/2018  . HCAP (healthcare-associated pneumonia) 06/04/2018  . Acute respiratory failure with hypoxia ([place]) 04/14/2018  . Pulmonary nodule 04/14/2018  . CKD (chronic kidney disease), stage III 04/14/2018  . Type 2 diabetes mellitus with hyperlipidemia ([place]) 03/12/2018  . BPH with obstruction/lower urinary tract symptoms 08/17/2017  . Left sided numbness 07/22/2017  . TIA (transient ischemic attack) 07/22/2017  . Pneumonia 07/14/2017  . COPD with acute exacerbation ([place]) 07/14/2017  . Constipation 07/10/2017  . Hx of adenomatous colonic polyps 05/01/2017  . Hypertension   . Chronic pain   . Heme positive stool 10/20/2014  . Epigastric pain 06/19/2014  . Dysphagia 06/19/2014  . AP (abdominal pain) 11/13/2013  . Early satiety 11/13/2013  . COPD, severe ([place]) 02/22/2013  . Low back pain 11/19/2012  . COLITIS 05/12/2009  . ABDOMINAL PAIN, LEFT LOWER QUADRANT 05/12/2009  . DUODENAL ULCER, HX OF 05/12/2009  . H/o Alcohol abuse- Quit in 2010 05/11/2009  . GERD 05/11/2009  . Diarrhea 05/11/2009  . SPONDYLOSIS, CERVICAL 07/03/2007  . NECK PAIN 07/03/2007    Past Surgical History:  Procedure Laterality Date  . BACK SURGERY  7/05; 5/09    Dr.Hirsch,3 lumbar  X3  . BACK SURGERY    . COLONOSCOPY  Feb 2012   Dr. Gala Romney: normal rectum, pedunculate polyp removed but not recovered  . COLONOSCOPY WITH ESOPHAGOGASTRODUODENOSCOPY (EGD) N/A 11/18/2013   Dr.Rourk-  tcs= normal rectum, multipe polyps about the ileocecal valve and distal transverse colon o/w the remainder of the colonic mucosa appeared normal bx= tubular adenoma. EGD= normal esophagus, stomach with scattered erosions mottling, friablility, no ulcer or infiltrating process patent pylorus bx= chronic inflammation. next TCS 11/2016  . COLONOSCOPY WITH PROPOFOL N/A 05/25/2017   Dr. Gala Romney: sigmoid diverticulosis, one 4 mm hyerplastic rectal polyp, ascending colonic AVMs surveillance 2023  . ESOPHAGOGASTRODUODENOSCOPY   1/06   Dr. Volney American esophageal erosions,U-shaped stomach,marked erosions and edema of the bulb without discrete ulcer disease.   . ESOPHAGOGASTRODUODENOSCOPY (EGD) WITH PROPOFOL N/A 05/25/2017   Dr. Gala Romney: reflux esophagitis s/p empiric dilation, normal stomach and duodenum  . MALONEY DILATION N/A 05/25/2017   Procedure: Venia Minks DILATION;  Surgeon: Daneil Dolin, MD;  Location: [facility] ENDO SUITE;  Service: Endoscopy;  Laterality: N/A;  . NECK SURGERY  10/2007   PLATE IN NECK  . POLYPECTOMY  05/25/2017   Procedure: POLYPECTOMY;  Surgeon: Daneil Dolin, MD;  Location: [facility] ENDO SUITE;  Service: Endoscopy;;  rectal  . TRANSURETHRAL RESECTION OF PROSTATE N/A 08/17/2017   Procedure: TRANSURETHRAL RESECTION OF THE PROSTATE (TURP);  Surgeon: Irine Seal, MD;  Location: [facility] ORS;  Service: Urology;  Laterality: N/A;    Allergies Patient has no known allergies.  Family History  Problem Relation Age of Onset  . Cancer Father   . Asthma Mother   . Colon cancer Neg Hx     Social History Social History   Tobacco Use  . Smoking status: Former Smoker    Packs/day: 0.50    Years: 40.00    Pack years: 20.00    Types: Cigarettes    Quit date: 11/11/2014    Years since quitting: 5.0  . Smokeless tobacco: Never Used  Substance Use Topics  . Alcohol use: No    Alcohol/week: 0.0 standard drinks  . Drug use: Yes    Types: Marijuana    Comment: occas    Review of Systems  Constitutional: No fever but some chills.  Eyes: No visual changes. ENT: No sore throat. Cardiovascular: Positive chest pain. Respiratory: Positive shortness of breath. Gastrointestinal: No abdominal pain.  No nausea, no vomiting.  No diarrhea.  No constipation. Genitourinary: Negative for dysuria. Musculoskeletal: Negative for back pain. Skin: Negative for rash. Neurological: Negative for focal weakness or numbness. Positive HA.   10-point ROS otherwise  negative.  ____________________________________________   PHYSICAL EXAM:  VITAL SIGNS: ED Triage Vitals  Enc Vitals Group     BP 11/23/19 1727 (!) 142/90     Pulse Rate 11/23/19 1727 (!) 105     Resp 11/23/19 1727 15     Temp --      Temp src --      SpO2 11/23/19 1727 100 %     Weight 11/23/19 1728 125 lb (56.7 kg)     Height 11/23/19 1728 _0  (1.6 m)   Constitutional: Alert and oriented. Well appearing and in no acute distress. Eyes: Conjunctivae are normal.  Head: Atraumatic. Nose: No congestion/rhinnorhea. Mouth/Throat: Mucous membranes are moist.  Oropharynx non-erythematous. Neck: No stridor.  Cardiovascular: Tachycardia. Good peripheral circulation. Grossly normal heart sounds.   Respiratory: Normal respiratory effort.  No retractions. Lungs CTAB. No wheezing, rales, or rhonchi.  Gastrointestinal: Soft and nontender. No distention.  Musculoskeletal: No lower extremity tenderness nor edema. No gross deformities of extremities. Neurologic:  Normal speech and language. No gross focal neurologic deficits are appreciated.  Skin:  Skin is warm, dry and  intact. No rash noted.  ____________________________________________   LABS (all labs ordered are listed, but only abnormal results are displayed)  Labs Reviewed  COMPREHENSIVE METABOLIC PANEL - Abnormal; Notable for the following components:      Result Value   Glucose, Bld 102 (*)    Creatinine, Ser 1.59 (*)    Albumin 3.4 (*)    GFR calc non Af Amer 46 (*)    GFR calc Af Amer 53 (*)    All other components within normal limits  BRAIN NATRIURETIC PEPTIDE - Abnormal; Notable for the following components:   B Natriuretic Peptide 105.0 (*)    All other components within normal limits  CBC WITH DIFFERENTIAL/PLATELET - Abnormal; Notable for the following components:   WBC 12.7 (*)    RDW 20.4 (*)    Neutro Abs 8.7 (*)    Monocytes Absolute 1.2 (*)    Abs Immature Granulocytes 0.08 (*)    All other components  within normal limits  TROPONIN I (HIGH SENSITIVITY) - Abnormal; Notable for the following components:   Troponin I (High Sensitivity) 28 (*)    All other components within normal limits  RESPIRATORY PANEL BY RT PCR (FLU A&B, COVID)  POC SARS CORONAVIRUS 2 AG -  ED  TROPONIN I (HIGH SENSITIVITY)   ____________________________________________  EKG   EKG Interpretation  Date/Time:  Saturday November 23 2019 17:24:36 EST Ventricular Rate:  107 PR Interval:    QRS Duration: 96 QT Interval:  345 QTC Calculation: 461 R Axis:   94 Text Interpretation: Sinus tachycardia Ventricular premature complex Aberrant conduction of SV complex(es) Lateral infarct, old Baseline wander in lead(s) II III aVF No STEMI Confirmed by Nanda Quinton [phone]) on 11/23/2019 5:59:48 PM       ____________________________________________  RADIOLOGY  CT Angio Chest PE W and/or Wo Contrast  Result Date: 11/23/2019 CLINICAL DATA:  Mid chest pain, shortness of breath and headache x3 days. EXAM: CT ANGIOGRAPHY CHEST WITH CONTRAST TECHNIQUE: Multidetector CT imaging of the chest was performed using the standard protocol during bolus administration of intravenous contrast. Multiplanar CT image reconstructions and MIPs were obtained to evaluate the vascular anatomy. CONTRAST:  1110m OMNIPAQUE IOHEXOL 350 MG/ML SOLN COMPARISON:  August 02, 2019 FINDINGS: Cardiovascular: Satisfactory opacification of the pulmonary arteries to the segmental level. No evidence of pulmonary embolism. Normal heart size. No pericardial effusion. Moderate to marked severity coronary artery calcification is seen. This is present on the prior study. Mediastinum/Nodes: No enlarged mediastinal, hilar, or axillary lymph nodes. Thyroid gland, trachea, and esophagus demonstrate no significant findings. Lungs/Pleura: A trace amount of atelectasis is seen within the inferior aspect of the left upper lobe. There is no evidence of acute infiltrate, pleural effusion  or pneumothorax. Upper Abdomen: No acute abnormality. Musculoskeletal: A metallic density fusion plate and screws are seen along the anterior aspect of the lower cervical spine. Review of the MIP images confirms the above findings. IMPRESSION: 1. No CT evidence of pulmonary embolism or acute cardiopulmonary disease. 2. Moderate to marked severity coronary artery calcification. 3. Prior cervical spine fusion. Electronically Signed   By: TVirgina NorfolkM.D.   On: 11/23/2019 19:33   DG Chest Portable 1 View  Result Date: 11/23/2019 CLINICAL DATA:  Headache and chest pain. EXAM: PORTABLE CHEST 1 VIEW COMPARISON:  November 09, 2019 FINDINGS: The lungs are hyperinflated. There is no evidence of acute infiltrate, pleural effusion or pneumothorax. The heart size and mediastinal contours are within normal limits. A radiopaque fusion plate and screws  are seen overlying the lower cervical spine. The visualized skeletal structures are otherwise unremarkable. IMPRESSION: No active disease. Electronically Signed   By: Virgina Norfolk M.D.   On: 11/23/2019 18:06    ____________________________________________   PROCEDURES  Procedure(s) performed:   Procedures  CRITICAL CARE Performed by: Margette Fast Total critical care time: 35 minutes Critical care time was exclusive of separately billable procedures and treating other patients. Critical care was necessary to treat or prevent imminent or life-threatening deterioration. Critical care was time spent personally by me on the following activities: development of treatment plan with patient and/or surrogate as well as nursing, discussions with consultants, evaluation of patient's response to treatment, examination of patient, obtaining history from patient or surrogate, ordering and performing treatments and interventions, ordering and review of laboratory studies, ordering and review of radiographic studies, pulse oximetry and re-evaluation of patient's  condition.  Nanda Quinton, MD Emergency Medicine  ____________________________________________   INITIAL IMPRESSION / ASSESSMENT AND PLAN / ED COURSE  Pertinent labs & imaging results that were available during my care of the patient were reviewed by me and considered in my medical decision making (see chart for details).   Patient presents emergency department for evaluation of central chest tightness with shortness of breath.  He is on his baseline home oxygen here in the ED.  I not appreciate significant wheezing.  Plan for screening labs with troponin and follow-up after chest x-ray.  Will be testing for COVID-19 with some subjective chills in addition to headache and respiratory symptoms.  Also considering PE is a possibility but will evaluate further after initial testing.   Labs with elevated troponin of 28.  No active chest pain.  BNP is somewhat elevated.  CTA obtained with tachycardia, CP/SOB symptoms, and no wheezing on exam.  No PE but patient does have significant coronary artery calcification. HEART score of 5.  Discussed with the cardiology fellow on-call, Dr. Theresa Duty who agrees with plan for hospitalist admission to [facility].  Will start heparin given slightly elevated troponin and intermittent chest pain symptoms.  No active chest pain.  Will admit to hospitalist with still undifferentiated CP.   Discussed patient's case with TRH, Dr. Humphrey Rolls to request admission. Patient and family (if present) updated with plan. Care transferred to [facility] service.  I reviewed all nursing notes, vitals, pertinent old records, EKGs, labs, imaging (as available).  ____________________________________________  FINAL CLINICAL IMPRESSION(S) / ED DIAGNOSES  Final diagnoses:  Precordial chest pain  SOB (shortness of breath)     MEDICATIONS GIVEN DURING THIS VISIT:  Medications  methylPREDNISolone sodium succinate (SOLU-MEDROL) 125 mg/2 mL injection 125 mg (125 mg Intravenous Given 11/23/19  1849)  Ipratropium-Albuterol (COMBIVENT) respimat 2 puff (2 puffs Inhalation Given 11/23/19 1856)  iohexol (OMNIPAQUE) 350 MG/ML injection 100 mL (100 mLs Intravenous Contrast Given 11/23/19 1908)  aspirin chewable tablet 243 mg (243 mg Oral Given 11/23/19 2036)    Note:  This document was prepared using Dragon voice recognition software and may include unintentional dictation errors.  Nanda Quinton, MD, [facility] Emergency Medicine    Dayquan Buys, [place], MD 11/23/19 2101

## 2019-11-23 NOTE — H&P (Signed)
History and Physical    Bryan [name] DHU:[id number] DOB: 1955/11/20 DOA: 11/23/2019  PCP: Rosita Fire, MD (Confirm with patient/family/NH records and if not entered, this has to be entered at [facility] point of entry)  Patient coming from: Home   I have personally briefly reviewed patient's old medical records in [place]  Chief Complaint: Chest pain and shortness of breath  HPI: Bryan [name] is a 64 y.o. male with medical history significant of hypertension, diabetes mellitus and COPD with chronic respiratory failure on 2 L of oxygen at home presented to ED for shortness of breath with exertion and central chest pain.  Patient states that he is having episodic chest pain going on for many days but it is more frequent for the last 2 to 3 days.  Chest pain is located in the center of chest, squeezing and pressure-like sensation, 7 out of 10 on the pain scale when it worsens, get worse with exertion and gets better with rest.  Pain is also associated with severe worsening of shortness of breath.  Patient denies any fever, chills, sore throat, cough, nausea, vomiting, abdominal pain abdominal pain and swelling in the lower extremities.  Patient also denies any recent injury to the chest or recent travel history.  Patient states that last month he visited the hospital for same problem but he was treated for COPD with steroids and the symptoms did not improve with that treatment.  Patient denies smoking, alcohol and illicit drug use.  ED Course: On arrival to the ED, patient had blood pressure of 142/90, heart rate 105, respiratory rate 15 and oxygen saturation 100% on room air.  Blood work showed WBC 12.7, hemoglobin 14.2, sodium 140, potassium 4.7, BUN 21, creatinine 1.59, blood glucose 102 and elevated troponin of 28.  CT angiogram chest was done because of chest pain and tachycardia and it was negative for pulmonary embolism but showed moderate coronary artery calcifications.  EKG showed sinus  tachycardia with no acute STEMI and was similar to previous EKG.  Patient was given aspirin 324 mg and DuoNeb with IV Solu-Medrol in the ED.  ED physician also started the patient on IV heparin because of elevated troponin and due to increase coronary artery disease risk from significant coronary artery calcifications.  ED physician contacted on-call cardiology fellow Dr.Bazemore who agreed with ED physicians plan and recommended admission to [facility] for further evaluation and management.  Review of Systems: As per HPI otherwise 10 point review of systems negative.   Past Medical History:  Diagnosis Date  . Acid reflux   . Arthritis   . Asthma   . Chronic pain    with leg and back pain (disc problem)  . COPD (chronic obstructive pulmonary disease) ([place])   . Diabetes mellitus without complication ([place])   . Headache    HX OF  . History of upper GI x-ray series    to follow showed large duodenal ulcer H pylori serologies were negative  . Hypertension   . Pneumonia    07/17/17  . Pre-diabetes   . Stroke [facility]    TIA MINI STROKE  . Tubular adenoma     Past Surgical History:  Procedure Laterality Date  . BACK SURGERY  7/05; 5/09    Dr.Hirsch,3 lumbar  X3  . BACK SURGERY    . COLONOSCOPY  Feb 2012   Dr. Gala Romney: normal rectum, pedunculate polyp removed but not recovered  . COLONOSCOPY WITH ESOPHAGOGASTRODUODENOSCOPY (EGD) N/A 11/18/2013  Dr.Rourk- tcs= normal rectum, multipe polyps about the ileocecal valve and distal transverse colon o/w the remainder of the colonic mucosa appeared normal bx= tubular adenoma. EGD= normal esophagus, stomach with scattered erosions mottling, friablility, no ulcer or infiltrating process patent pylorus bx= chronic inflammation. next TCS 11/2016  . COLONOSCOPY WITH PROPOFOL N/A 05/25/2017   Dr. Gala Romney: sigmoid diverticulosis, one 4 mm hyerplastic rectal polyp, ascending colonic AVMs surveillance 2023  . ESOPHAGOGASTRODUODENOSCOPY  1/06   Dr. Volney American  esophageal erosions,U-shaped stomach,marked erosions and edema of the bulb without discrete ulcer disease.   . ESOPHAGOGASTRODUODENOSCOPY (EGD) WITH PROPOFOL N/A 05/25/2017   Dr. Gala Romney: reflux esophagitis s/p empiric dilation, normal stomach and duodenum  . MALONEY DILATION N/A 05/25/2017   Procedure: Venia Minks DILATION;  Surgeon: Daneil Dolin, MD;  Location: [facility] ENDO SUITE;  Service: Endoscopy;  Laterality: N/A;  . NECK SURGERY  10/2007   PLATE IN NECK  . POLYPECTOMY  05/25/2017   Procedure: POLYPECTOMY;  Surgeon: Daneil Dolin, MD;  Location: [facility] ENDO SUITE;  Service: Endoscopy;;  rectal  . TRANSURETHRAL RESECTION OF PROSTATE N/A 08/17/2017   Procedure: TRANSURETHRAL RESECTION OF THE PROSTATE (TURP);  Surgeon: Irine Seal, MD;  Location: [facility] ORS;  Service: Urology;  Laterality: N/A;     reports that he quit smoking about 5 years ago. His smoking use included cigarettes. He has a 20.00 pack-year smoking history. He has never used smokeless tobacco. He reports current drug use. Drug: Marijuana. He reports that he does not drink alcohol.  No Known Allergies  Family History  Problem Relation Age of Onset  . Cancer Father   . Asthma Mother   . Colon cancer Neg Hx      Prior to Admission medications   Medication Sig Start Date End Date Taking? Authorizing Provider  acetaminophen (TYLENOL) 325 MG tablet Take 2 tablets (650 mg total) by mouth every 6 (six) hours as needed for mild pain (or Fever >/= 101). 06/14/19  Yes Emokpae, Courage, MD  albuterol (PROVENTIL) (2.5 MG/3ML) 0.083% nebulizer solution Take 3 mLs (2.5 mg total) by nebulization every 4 (four) hours as needed for wheezing or shortness of breath. 06/14/19  Yes Emokpae, Courage, MD  albuterol (VENTOLIN HFA) 108 (90 Base) MCG/ACT inhaler Inhale 2 puffs into the lungs every 4 (four) hours as needed for wheezing or shortness of breath. Do not use with nebulizer 06/14/19  Yes Emokpae, Courage, MD  Fluticasone-Umeclidin-Vilant (TRELEGY  ELLIPTA) 100-62.5-25 MCG/INH AEPB Inhale 1 puff into the lungs daily. 06/14/19  Yes Emokpae, Courage, MD  gabapentin (NEURONTIN) 300 MG capsule Take 1 capsule (300 mg total) by mouth 2 (two) times daily. Pt. Says he is taking twice daily. Patient taking differently: Take 300 mg by mouth 2 (two) times daily.  06/14/19  Yes Emokpae, Courage, MD  guaiFENesin (MUCINEX) 600 MG 12 hr tablet Take 1 tablet (600 mg total) by mouth 2 (two) times daily. Patient taking differently: Take 600 mg by mouth daily.  06/14/19 06/13/20 Yes Roxan Hockey, MD  linaclotide (LINZESS) 72 MCG capsule Take 1 capsule (72 mcg total) by mouth daily before breakfast. 06/14/19  Yes Emokpae, Courage, MD  OXYGEN Inhale 2.5 L into the lungs daily.   Yes [provider]  polyethylene glycol powder (MIRALAX) 17 GM/SCOOP powder Take 17 g by mouth daily as needed (FOR CONSTIPATION).  11/14/18  Yes [provider]  aspirin EC 81 MG tablet Take 81 mg by mouth daily.    [provider]  pantoprazole (PROTONIX) 40 MG  tablet Take 1 tablet (40 mg total) by mouth daily before breakfast. Patient not taking: Reported on 11/23/2019 02/15/19   [name], NP  predniSONE (STERAPRED UNI-PAK 21 TAB) 10 MG (21) TBPK tablet Take by mouth daily. Take 6 tabs by mouth daily  for 2 days, then 5 tabs for 2 days, then 4 tabs for 2 days, then 3 tabs for 2 days, 2 tabs for 2 days, then 1 tab by mouth daily for 2 days Patient not taking: Reported on 11/23/2019 11/09/19   Cherre Robins, PA-C    Physical Exam: Vitals:   11/23/19 2030 11/23/19 2121 11/23/19 2130 11/23/19 2200  BP: (!) 153/92 (!) 173/99 (!) 166/89 (!) 181/99  Pulse: 98 (!) 103 100 98  Resp: (!) 22 (!) 25 (!) 24 19  SpO2: 94% 95% 96% 96%  Weight:      Height:        Constitutional: NAD, calm, comfortable Vitals:   11/23/19 2030 11/23/19 2121 11/23/19 2130 11/23/19 2200  BP: (!) 153/92 (!) 173/99 (!) 166/89 (!) 181/99  Pulse: 98 (!) 103 100 98  Resp: (!) 22 (!)  25 (!) 24 19  SpO2: 94% 95% 96% 96%  Weight:      Height:        General: Patient is laying comfortably in the bed and is on 2 L of oxygen with nasal cannula. Eyes: PERRL, lids and conjunctivae normal ENMT: Mucous membranes are moist. Posterior pharynx clear of any exudate or lesions.Normal dentition.  Neck: normal, supple, no masses, no thyromegaly Respiratory: Patient is on 2 L of oxygen with nasal cannula.  Chest is clear to auscultation bilaterally, no wheezing, no crackles. Normal respiratory effort. No accessory muscle use.  Cardiovascular: Chest is nontender on palpation regular rate and rhythm, no murmurs / rubs / gallops. No extremity edema. 2+ pedal pulses. No carotid bruits.  Abdomen: no tenderness, no masses palpated. No hepatosplenomegaly. Bowel sounds positive.  Musculoskeletal: no clubbing / cyanosis. No joint deformity upper and lower extremities. Good ROM, no contractures. Normal muscle tone.  Skin: no rashes, lesions, ulcers. No induration Neurologic: CN 2-12 grossly intact. Sensation intact, DTR normal. Strength 5/5 in all 4.  Psychiatric: Normal judgment and insight. Alert and oriented x 3. Normal mood.     Labs on Admission: I have personally reviewed following labs and imaging studies  CBC: Recent Labs  Lab 11/23/19 1728  WBC 12.7*  NEUTROABS 8.7*  HGB 14.2  HCT 46.1  MCV 87.8  PLT 625   Basic Metabolic Panel: Recent Labs  Lab 11/23/19 1728  NA 140  K 4.7  CL 101  CO2 32  GLUCOSE 102*  BUN 21  CREATININE 1.59*  CALCIUM 8.9   GFR: Estimated Creatinine Clearance: 38.1 mL/min (A) (by C-G formula based on SCr of 1.59 mg/dL (H)). Liver Function Tests: Recent Labs  Lab 11/23/19 1728  AST 24  ALT 26  ALKPHOS 74  BILITOT 0.5  PROT 7.0  ALBUMIN 3.4*   No results for input(s): LIPASE, AMYLASE in the last 168 hours. No results for input(s): AMMONIA in the last 168 hours. Coagulation Profile: No results for input(s): INR, PROTIME in the last 168  hours. Cardiac Enzymes: No results for input(s): CKTOTAL, CKMB, CKMBINDEX, TROPONINI in the last 168 hours. BNP (last 3 results) No results for input(s): PROBNP in the last 8760 hours. HbA1C: No results for input(s): HGBA1C in the last 72 hours. CBG: Recent Labs  Lab 11/23/19 2214  GLUCAP 161*  Lipid Profile: No results for input(s): CHOL, HDL, LDLCALC, TRIG, CHOLHDL, LDLDIRECT in the last 72 hours. Thyroid Function Tests: No results for input(s): TSH, T4TOTAL, FREET4, T3FREE, THYROIDAB in the last 72 hours. Anemia Panel: No results for input(s): VITAMINB12, FOLATE, FERRITIN, TIBC, IRON, RETICCTPCT in the last 72 hours. Urine analysis:    Component Value Date/Time   COLORURINE YELLOW 11/09/2019 1829   APPEARANCEUR CLEAR 11/09/2019 1829   LABSPEC 1.019 11/09/2019 1829   PHURINE 6.0 11/09/2019 1829   GLUCOSEU NEGATIVE 11/09/2019 1829   HGBUR NEGATIVE 11/09/2019 1829   BILIRUBINUR NEGATIVE 11/09/2019 1829   KETONESUR NEGATIVE 11/09/2019 1829   PROTEINUR 100 (A) 11/09/2019 1829   UROBILINOGEN 0.2 07/08/2015 1314   NITRITE NEGATIVE 11/09/2019 1829   LEUKOCYTESUR NEGATIVE 11/09/2019 1829    Radiological Exams on Admission: CT Angio Chest PE W and/or Wo Contrast  Result Date: 11/23/2019 CLINICAL DATA:  Mid chest pain, shortness of breath and headache x3 days. EXAM: CT ANGIOGRAPHY CHEST WITH CONTRAST TECHNIQUE: Multidetector CT imaging of the chest was performed using the standard protocol during bolus administration of intravenous contrast. Multiplanar CT image reconstructions and MIPs were obtained to evaluate the vascular anatomy. CONTRAST:  182m OMNIPAQUE IOHEXOL 350 MG/ML SOLN COMPARISON:  August 02, 2019 FINDINGS: Cardiovascular: Satisfactory opacification of the pulmonary arteries to the segmental level. No evidence of pulmonary embolism. Normal heart size. No pericardial effusion. Moderate to marked severity coronary artery calcification is seen. This is present on the  prior study. Mediastinum/Nodes: No enlarged mediastinal, hilar, or axillary lymph nodes. Thyroid gland, trachea, and esophagus demonstrate no significant findings. Lungs/Pleura: A trace amount of atelectasis is seen within the inferior aspect of the left upper lobe. There is no evidence of acute infiltrate, pleural effusion or pneumothorax. Upper Abdomen: No acute abnormality. Musculoskeletal: A metallic density fusion plate and screws are seen along the anterior aspect of the lower cervical spine. Review of the MIP images confirms the above findings. IMPRESSION: 1. No CT evidence of pulmonary embolism or acute cardiopulmonary disease. 2. Moderate to marked severity coronary artery calcification. 3. Prior cervical spine fusion. Electronically Signed   By: TVirgina NorfolkM.D.   On: 11/23/2019 19:33   DG Chest Portable 1 View  Result Date: 11/23/2019 CLINICAL DATA:  Headache and chest pain. EXAM: PORTABLE CHEST 1 VIEW COMPARISON:  November 09, 2019 FINDINGS: The lungs are hyperinflated. There is no evidence of acute infiltrate, pleural effusion or pneumothorax. The heart size and mediastinal contours are within normal limits. A radiopaque fusion plate and screws are seen overlying the lower cervical spine. The visualized skeletal structures are otherwise unremarkable. IMPRESSION: No active disease. Electronically Signed   By: TVirgina NorfolkM.D.   On: 11/23/2019 18:06    EKG: Independently reviewed.  EKG showed sinus tachycardia with no STEMI.  EKG is similar to previous EKG.  Assessment/Plan Principal Problem:    [facility] to trend the troponins. Continue IV heparin Nitroglycerin sublingual as needed for chest pain. Oxygen supplementation with nasal cannula as needed to maintain oxygen saturation within normal limits. N.p.o. after midnight till evaluated by cardiology.  Active Problems:   Hypertension IV hydralazine 10 mg every  8 hours as needed if the systolic blood pressure is above 1109or diastolic blood pressure above 105.    SOB (shortness of breath) Shortness of breath is improved at this time.  Continue oxygen supplementation with nasal cannula.  DuoNeb as needed for shortness of breath.  Continue home medications for chronic medical problems.  DVT prophylaxis: Patient is on heparin drip Code Status: Full code Disposition Plan: Patient will be discharged home after cleared by cardiology Consults called: Cardiology consult ordered by ED physician and patient will be seen by cardiology in the morning in [facility]. Admission status: Observation/telemetry cardiac   Edmonia Lynch MD [facility] Pager [phone]  If 7PM-7AM, please contact night-coverage [email or web address] Password [facility]  11/23/2019, 10:42 PM

## 2019-11-23 NOTE — ED Triage Notes (Signed)
C/o chest pain for last 3 days.  Denies any cough.  C/o increasing SOB and headache.

## 2019-11-24 ENCOUNTER — Observation Stay (HOSPITAL_BASED_OUTPATIENT_CLINIC_OR_DEPARTMENT_OTHER): Payer: Medicare Other

## 2019-11-24 DIAGNOSIS — Z87891 Personal history of nicotine dependence: Secondary | ICD-10-CM

## 2019-11-24 DIAGNOSIS — I10 Essential (primary) hypertension: Secondary | ICD-10-CM

## 2019-11-24 DIAGNOSIS — I214 Non-ST elevation (NSTEMI) myocardial infarction: Secondary | ICD-10-CM

## 2019-11-24 DIAGNOSIS — I361 Nonrheumatic tricuspid (valve) insufficiency: Secondary | ICD-10-CM | POA: Diagnosis not present

## 2019-11-24 DIAGNOSIS — I2510 Atherosclerotic heart disease of native coronary artery without angina pectoris: Secondary | ICD-10-CM

## 2019-11-24 DIAGNOSIS — R072 Precordial pain: Secondary | ICD-10-CM | POA: Diagnosis not present

## 2019-11-24 DIAGNOSIS — Z9981 Dependence on supplemental oxygen: Secondary | ICD-10-CM

## 2019-11-24 LAB — CBC
HCT: 47.9 % (ref 39.0–52.0)
Hemoglobin: 14.8 g/dL (ref 13.0–17.0)
MCH: 26.9 pg (ref 26.0–34.0)
MCHC: 30.9 g/dL (ref 30.0–36.0)
MCV: 86.9 fL (ref 80.0–100.0)
Platelets: 191 10*3/uL (ref 150–400)
RBC: 5.51 MIL/uL (ref 4.22–5.81)
RDW: 20.1 % — ABNORMAL HIGH (ref 11.5–15.5)
WBC: 13.1 10*3/uL — ABNORMAL HIGH (ref 4.0–10.5)
nRBC: 0 % (ref 0.0–0.2)

## 2019-11-24 LAB — TROPONIN I (HIGH SENSITIVITY): Troponin I (High Sensitivity): 26 ng/L — ABNORMAL HIGH (ref <18)

## 2019-11-24 LAB — RAPID URINE DRUG SCREEN, HOSP PERFORMED
Amphetamines: NOT DETECTED
Barbiturates: NOT DETECTED
Benzodiazepines: NOT DETECTED
Cocaine: NOT DETECTED
Opiates: NOT DETECTED
Tetrahydrocannabinol: NOT DETECTED

## 2019-11-24 LAB — BASIC METABOLIC PANEL
Anion gap: 9 (ref 5–15)
BUN: 19 mg/dL (ref 8–23)
CO2: 30 mmol/L (ref 22–32)
Calcium: 9.3 mg/dL (ref 8.9–10.3)
Chloride: 97 mmol/L — ABNORMAL LOW (ref 98–111)
Creatinine, Ser: 1.5 mg/dL — ABNORMAL HIGH (ref 0.61–1.24)
GFR calc Af Amer: 57 mL/min — ABNORMAL LOW (ref >60)
GFR calc non Af Amer: 49 mL/min — ABNORMAL LOW (ref >60)
Glucose, Bld: 129 mg/dL — ABNORMAL HIGH (ref 70–99)
Potassium: 5.5 mmol/L — ABNORMAL HIGH (ref 3.5–5.1)
Sodium: 136 mmol/L (ref 135–145)

## 2019-11-24 LAB — ECHOCARDIOGRAM COMPLETE
Height: 63 in
Weight: 1961.6 oz

## 2019-11-24 LAB — LIPID PANEL
Cholesterol: 241 mg/dL — ABNORMAL HIGH (ref 0–200)
HDL: 86 mg/dL (ref >40)
LDL Cholesterol: 146 mg/dL — ABNORMAL HIGH (ref 0–99)
Total CHOL/HDL Ratio: 2.8 RATIO
Triglycerides: 45 mg/dL (ref <150)
VLDL: 9 mg/dL (ref 0–40)

## 2019-11-24 LAB — HEPARIN LEVEL (UNFRACTIONATED)
Heparin Unfractionated: 0.54 IU/mL (ref 0.30–0.70)
Heparin Unfractionated: 0.45 IU/mL (ref 0.30–0.70)

## 2019-11-24 LAB — HEMOGLOBIN A1C
Hgb A1c MFr Bld: 6.7 % — ABNORMAL HIGH (ref 4.8–5.6)
Mean Plasma Glucose: 145.59 mg/dL

## 2019-11-24 MED ORDER — AMLODIPINE BESYLATE 5 MG PO TABS
5.0000 mg | ORAL_TABLET | Freq: Every day | ORAL | Status: DC
  Administered 2019-11-24: 13:00:00 5 mg via ORAL
  Filled 2019-11-24: qty 1

## 2019-11-24 MED ORDER — UMECLIDINIUM BROMIDE 62.5 MCG/INH IN AEPB
1.0000 | INHALATION_SPRAY | Freq: Every day | RESPIRATORY_TRACT | Status: DC
  Administered 2019-11-25 – 2019-12-04 (×10): 1 via RESPIRATORY_TRACT
  Filled 2019-11-24 (×2): qty 7

## 2019-11-24 MED ORDER — GABAPENTIN 300 MG PO CAPS
300.0000 mg | ORAL_CAPSULE | Freq: Two times a day (BID) | ORAL | Status: DC
  Administered 2019-11-24 – 2019-12-04 (×19): 300 mg via ORAL
  Filled 2019-11-24 (×20): qty 1

## 2019-11-24 MED ORDER — CARVEDILOL 6.25 MG PO TABS
6.2500 mg | ORAL_TABLET | Freq: Two times a day (BID) | ORAL | Status: DC
  Administered 2019-11-25: 02:00:00 6.25 mg via ORAL
  Filled 2019-11-24: qty 1

## 2019-11-24 MED ORDER — FLUTICASONE-UMECLIDIN-VILANT 100-62.5-25 MCG/INH IN AEPB: 1.0000 | INHALATION_SPRAY | Freq: Every day | RESPIRATORY_TRACT | Status: DC

## 2019-11-24 MED ORDER — ALUM & MAG HYDROXIDE-SIMETH 200-200-20 MG/5ML PO SUSP
30.0000 mL | ORAL | Status: DC | PRN
  Administered 2019-11-24 – 2019-11-26 (×5): 30 mL via ORAL
  Filled 2019-11-24 (×6): qty 30

## 2019-11-24 MED ORDER — ATORVASTATIN CALCIUM 40 MG PO TABS
40.0000 mg | ORAL_TABLET | Freq: Every day | ORAL | Status: DC
  Administered 2019-11-25 – 2019-12-03 (×10): 40 mg via ORAL
  Filled 2019-11-24 (×10): qty 1

## 2019-11-24 MED ORDER — LINACLOTIDE 72 MCG PO CAPS
72.0000 ug | ORAL_CAPSULE | Freq: Every day | ORAL | Status: DC
  Filled 2019-11-24: qty 1

## 2019-11-24 MED ORDER — FLUTICASONE FUROATE-VILANTEROL 100-25 MCG/INH IN AEPB
1.0000 | INHALATION_SPRAY | Freq: Every day | RESPIRATORY_TRACT | Status: DC
  Administered 2019-11-25 – 2019-12-04 (×10): 1 via RESPIRATORY_TRACT
  Filled 2019-11-24: qty 28

## 2019-11-24 MED ORDER — HYDRALAZINE HCL 25 MG PO TABS
25.0000 mg | ORAL_TABLET | Freq: Three times a day (TID) | ORAL | Status: DC
  Administered 2019-11-24 (×2): 25 mg via ORAL
  Filled 2019-11-24 (×2): qty 1

## 2019-11-24 MED ORDER — ISOSORBIDE DINITRATE 10 MG PO TABS
20.0000 mg | ORAL_TABLET | Freq: Three times a day (TID) | ORAL | Status: DC
  Administered 2019-11-24 (×2): 20 mg via ORAL
  Filled 2019-11-24 (×2): qty 2

## 2019-11-24 NOTE — Progress Notes (Addendum)
Progress Note    Bryan [name]  YHC:[id number] DOB: 12-31-1955  DOA: 11/23/2019 PCP: Rosita Fire, MD    Brief Narrative:     Medical records reviewed and are as summarized below:  Bryan [name] is an 64 y.o. male who was brought in with chest pain and shortness of breath.  Patient initially presented to [facility] but was transferred down to [facility] for cardiology evaluation.  Assessment/Plan:   Principal Problem:   Chest pain Active Problems:   Hypertension   SOB (shortness of breath)   Chest pain/NSTEMI -Telemetry -CTA was done at [facility] that does not show PE or any other cardiopulmonary disease but does have marked severity of coronary artery calcification -Troponins mildly elevated -Continue IV heparin for 48 hours -Nitroglycerin sublingual as needed for chest pain. -Cardiology consult pending -We will start blood pressure medications    Hypertension -Does not appear to be on blood pressure medications at home    SOB (shortness of breath) Shortness of breath is improved at this time.    DuoNeb as needed for shortness of breath.   Chronic respiratory failure -Per MAR review wears 2.5 L of oxygen at home  Hyperkalemia -Recheck labs in the a.m.  Family Communication/Anticipated D/C date and plan/Code Status   DVT prophylaxis: Heparin drip Code Status: Full Code.  Family Communication:  Disposition Plan: Pending cardiology consultation and recommendations   Medical Consultants:    Cardiology    Subjective:   Patient states he is hungry  Objective:    Vitals:   11/24/19 0008 11/24/19 0120 11/24/19 0705 11/24/19 0847  BP: (!) 152/89 (!) 167/88 (!) 149/87 (!) 153/90  Pulse: (!) 102 (!) 103 91 100  Resp: (!) 38 20 (!) 22 18  Temp:  98.6 F (37 C) 98.4 F (36.9 C) 99.1 F (37.3 C)  TempSrc:  Oral  Oral  SpO2: 96% 99% 98% 97%  Weight:  55.6 kg    Height:  _0  (1.6 m)      Intake/Output Summary (Last 24 hours)  at 11/24/2019 1050 Last data filed at 11/24/2019 0845 Gross per 24 hour  Intake --  Output 500 ml  Net -500 ml   Filed Weights   11/23/19 1728 11/24/19 0120  Weight: 56.7 kg 55.6 kg    Exam: In bed, no acute distress Regular rate and rhythm No increased work of breathing, on nasal cannula Does not appear markedly volume overloaded Alert and oriented x3  Data Reviewed:   I have personally reviewed following labs and imaging studies:  Labs: Labs show the following:   Basic Metabolic Panel: Recent Labs  Lab 11/23/19 1728 11/24/19 0915  NA 140 136  K 4.7 5.5*  CL 101 97*  CO2 32 30  GLUCOSE 102* 129*  BUN 21 19  CREATININE 1.59* 1.50*  CALCIUM 8.9 9.3   GFR Estimated Creatinine Clearance: 39.6 mL/min (A) (by C-G formula based on SCr of 1.5 mg/dL (H)). Liver Function Tests: Recent Labs  Lab 11/23/19 1728  AST 24  ALT 26  ALKPHOS 74  BILITOT 0.5  PROT 7.0  ALBUMIN 3.4*   No results for input(s): LIPASE, AMYLASE in the last 168 hours. No results for input(s): AMMONIA in the last 168 hours. Coagulation profile No results for input(s): INR, PROTIME in the last 168 hours.  CBC: Recent Labs  Lab 11/23/19 1728 11/24/19 0528  WBC 12.7* 13.1*  NEUTROABS 8.7*  --   HGB 14.2 14.8  HCT 46.1 47.9  MCV 87.8 86.9  PLT 179 191   Cardiac Enzymes: No results for input(s): CKTOTAL, CKMB, CKMBINDEX, TROPONINI in the last 168 hours. BNP (last 3 results) No results for input(s): PROBNP in the last 8760 hours. CBG: Recent Labs  Lab 11/23/19 2214  GLUCAP 161*   D-Dimer: No results for input(s): DDIMER in the last 72 hours. Hgb A1c: No results for input(s): HGBA1C in the last 72 hours. Lipid Profile: No results for input(s): CHOL, HDL, LDLCALC, TRIG, CHOLHDL, LDLDIRECT in the last 72 hours. Thyroid function studies: No results for input(s): TSH, T4TOTAL, T3FREE, THYROIDAB in the last 72 hours.  Invalid input(s): FREET3 Anemia work up: No results for  input(s): VITAMINB12, FOLATE, FERRITIN, TIBC, IRON, RETICCTPCT in the last 72 hours. Sepsis Labs: Recent Labs  Lab 11/23/19 1728 11/24/19 0528  WBC 12.7* 13.1*    Microbiology Recent Results (from the past 240 hour(s))  Respiratory Panel by RT PCR (Flu A&B, Covid) - Nasopharyngeal Swab     Status: None   Collection Time: 11/23/19  8:28 PM   Specimen: Nasopharyngeal Swab  Result Value Ref Range Status   SARS Coronavirus 2 by RT PCR NEGATIVE NEGATIVE Final    Comment: (NOTE) SARS-CoV-2 target nucleic acids are NOT DETECTED. The SARS-CoV-2 RNA is generally detectable in upper respiratoy specimens during the acute phase of infection. The lowest concentration of SARS-CoV-2 viral copies this assay can detect is 131 copies/mL. A negative result does not preclude SARS-Cov-2 infection and should not be used as the sole basis for treatment or other patient management decisions. A negative result may occur with  improper specimen collection/handling, submission of specimen other than nasopharyngeal swab, presence of viral mutation(s) within the areas targeted by this assay, and inadequate number of viral copies (<131 copies/mL). A negative result must be combined with clinical observations, patient history, and epidemiological information. The expected result is Negative. Fact Sheet for Patients:  PinkCheek.be Fact Sheet for Healthcare Providers:  GravelBags.it This test is not yet ap proved or cleared by the [place] FDA and  has been authorized for detection and/or diagnosis of SARS-CoV-2 by FDA under an Emergency Use Authorization (EUA). This EUA will remain  in effect (meaning this test can be used) for the duration of the COVID-19 declaration under Section 564(b)(1) of the Act, 21 U.S.C. section 360bbb-3(b)(1), unless the authorization is terminated or revoked sooner.    Influenza A by PCR NEGATIVE NEGATIVE Final    Influenza B by PCR NEGATIVE NEGATIVE Final    Comment: (NOTE) The Xpert Xpress SARS-CoV-2/FLU/RSV assay is intended as an aid in  the diagnosis of influenza from Nasopharyngeal swab specimens and  should not be used as a sole basis for treatment. Nasal washings and  aspirates are unacceptable for Xpert Xpress SARS-CoV-2/FLU/RSV  testing. Fact Sheet for Patients: PinkCheek.be Fact Sheet for Healthcare Providers: GravelBags.it This test is not yet approved or cleared by the [place] FDA and  has been authorized for detection and/or diagnosis of SARS-CoV-2 by  FDA under an Emergency Use Authorization (EUA). This EUA will remain  in effect (meaning this test can be used) for the duration of the  Covid-19 declaration under Section 564(b)(1) of the Act, 21  U.S.C. section 360bbb-3(b)(1), unless the authorization is  terminated or revoked. Performed at [facility], [address], [place], [place] [address]     Procedures and diagnostic studies:  CT Angio Chest PE W and/or Wo Contrast  Result Date: 11/23/2019 CLINICAL DATA:  Mid chest pain, shortness of breath and headache x3 days. EXAM: CT ANGIOGRAPHY CHEST WITH CONTRAST TECHNIQUE: Multidetector CT imaging of the chest was performed using the standard protocol during bolus administration of intravenous contrast. Multiplanar CT image reconstructions and MIPs were obtained to evaluate the vascular anatomy. CONTRAST:  138m OMNIPAQUE IOHEXOL 350 MG/ML SOLN COMPARISON:  August 02, 2019 FINDINGS: Cardiovascular: Satisfactory opacification of the pulmonary arteries to the segmental level. No evidence of pulmonary embolism. Normal heart size. No pericardial effusion. Moderate to marked severity coronary artery calcification is seen. This is present on the prior study. Mediastinum/Nodes: No enlarged mediastinal, hilar, or axillary lymph nodes. Thyroid gland, trachea, and esophagus  demonstrate no significant findings. Lungs/Pleura: A trace amount of atelectasis is seen within the inferior aspect of the left upper lobe. There is no evidence of acute infiltrate, pleural effusion or pneumothorax. Upper Abdomen: No acute abnormality. Musculoskeletal: A metallic density fusion plate and screws are seen along the anterior aspect of the lower cervical spine. Review of the MIP images confirms the above findings. IMPRESSION: 1. No CT evidence of pulmonary embolism or acute cardiopulmonary disease. 2. Moderate to marked severity coronary artery calcification. 3. Prior cervical spine fusion. Electronically Signed   By: TVirgina NorfolkM.D.   On: 11/23/2019 19:33   DG Chest Portable 1 View  Result Date: 11/23/2019 CLINICAL DATA:  Headache and chest pain. EXAM: PORTABLE CHEST 1 VIEW COMPARISON:  November 09, 2019 FINDINGS: The lungs are hyperinflated. There is no evidence of acute infiltrate, pleural effusion or pneumothorax. The heart size and mediastinal contours are within normal limits. A radiopaque fusion plate and screws are seen overlying the lower cervical spine. The visualized skeletal structures are otherwise unremarkable. IMPRESSION: No active disease. Electronically Signed   By: TVirgina NorfolkM.D.   On: 11/23/2019 18:06    Medications:   . fluticasone furoate-vilanterol  1 puff Inhalation Daily  . gabapentin  300 mg Oral BID  . [START ON 11/25/2019] linaclotide  72 mcg Oral QAC breakfast  . umeclidinium bromide  1 puff Inhalation Daily   Continuous Infusions: . heparin 650 Units/hr (11/23/19 2124)     LOS: 0 days   JGeradine Girt [facility]   How to contact the T[facility] HospitalAttending or Consulting provider [place] covering provider during after hours [place] for this patient?  1. Check the care team in [facility]and look for a) attending/consulting [facility] provider listed and b) the [facility] listed 2. Log into [email or web address] and use [place]'s universal password to  access. If you do not have the password, please contact the hospital operator. 3. Locate the TTrinity Hospitalsprovider you are looking for under [facility] and page to a number that you can be directly reached. 4. If you still have difficulty reaching the provider, please page the [facility](Director on Call) for the Hospitalists listed on amion for assistance.  11/24/2019, 10:50 AM

## 2019-11-24 NOTE — Progress Notes (Signed)
ANTICOAGULATION CONSULT NOTE   Pharmacy Consult for Heparin Indication: chest pain/ACS  No Known Allergies  Patient Measurements: Height: _0  (160 cm) Weight: 122 lb 9.6 oz (55.6 kg) IBW/kg (Calculated) : 56.9 Heparin Dosing Weight: 56.7 kg  Vital Signs: Temp: 98.6 F (37 C) (03/14 0120) Temp Source: Oral (03/14 0120) BP: 167/88 (03/14 0120) Pulse Rate: 103 (03/14 0120)  Labs: Recent Labs    11/23/19 1728 11/23/19 1728 11/23/19 2043 11/23/19 2233 11/24/19 0018 11/24/19 0528  HGB 14.2  --   --   --   --  14.8  HCT 46.1  --   --   --   --  47.9  PLT 179  --   --   --   --  191  HEPARINUNFRC  --   --   --   --   --  0.54  CREATININE 1.59*  --   --   --   --   --   TROPONINIHS 28*   < > 29* 32* 26*  --    < > = values in this interval not displayed.    Estimated Creatinine Clearance: 37.4 mL/min (A) (by C-G formula based on SCr of 1.59 mg/dL (H)).   Medical History: Past Medical History:  Diagnosis Date  . Acid reflux   . Arthritis   . Asthma   . Chronic pain    with leg and back pain (disc problem)  . COPD (chronic obstructive pulmonary disease) ([place])   . Diabetes mellitus without complication ([place])   . Headache    HX OF  . History of upper GI x-ray series    to follow showed large duodenal ulcer H pylori serologies were negative  . Hypertension   . Pneumonia    07/17/17  . Pre-diabetes   . Stroke [facility])    TIA MINI STROKE  . Tubular adenoma     Assessment: 67 yom presenting with worsening chest pain with deep breathing, hx COPD. No AC PTA.   Hgb 14, plt 179. Trop 28. No s/sx of bleeding noted.   3/14 AM update: Initial heparin level therapeutic   Goal of Therapy:  Heparin level 0.3-0.7 units/ml Monitor platelets by anticoagulation protocol: Yes   Plan:  Cont heparin at 650 units/hr Confirmatory heparin level at [place], PharmD, [place] Pharmacist Phone: [phone]

## 2019-11-24 NOTE — Care Management Obs Status (Signed)
[place] NOTIFICATION   Patient Details  Name: [name] MRN: [id number] Date of Birth: 12-16-1955   Medicare Observation Status Notification Given:  Yes    Dawayne Patricia, RN 11/24/2019, 3:20 PM

## 2019-11-24 NOTE — Consult Note (Signed)
CARDIOLOGY CONSULT NOTE  Patient ID: Bryan [name] MRN: [id number] DOB/AGE: 64/08/1956 64 y.o.  Admit date: 11/23/2019 Referring Physician: Geradine Girt, DO Primary Physician:  Rosita Fire, MD Reason for Consultation: Chest pain  HPI:  Bryan [name] is a 64 y.o. male who presents with a chief complaint of " shortness of breath and chest pain." His past medical history and cardiovascular risk factors include: Hypertension, oxygen dependent COPD, prediabetes, coronary calcification.  Patient states that he presented to the hospital due to chest pain and shortness of breath.  The symptoms were going on for the last 2 days prior to presentation.  The pain was located substernally, described it as a tightness-like sensation, 10 out of 10 intensity, worse with effort related activities, "moving going fast," and when off oxygen.  The symptoms usually last for about 20 to 30 minutes and improved with rest and or putting his oxygen back on.  No associated symptoms of nausea, vomiting, diaphoresis or radiation of the discomfort.  Prior to the presentation patient states that he was hanging around with his buddies took off his oxygen for about 2 to 3 hours and during his social event he started having chest discomfort as described above.  As result he presents to the hospital for further evaluation.  He states that he has been to the hospital in the past and they have treated him for exacerbation of COPD.  Patient had a CT of the chest to rule out pulmonary embolism and the study noted "moderate to marked severity coronary artery calcification."  Given the clinical presentation and CT findings cardiology was consulted for further evaluation.  At the time of the evaluation patient is chest pain-free and eating lunch.  Patient states that the nature of his discomfort has not changed this time compared to the past events.  He is currently on IV heparin.  Upon review of his chart, patient's heart rate is not  well controlled his pulse ranges between 32-671 bpm.  His systolic blood pressures on admission was 142/90 and during the hospitalization its been ranging between 137-228 mmHg.  Patient states that he used to be on blood pressure pills but they were discontinued.  High sensitive troponin trend reviewed.  EKG reviewed.  No family history of premature coronary artery disease or sudden cardiac death.  ALLERGIES: No Known Allergies  PAST MEDICAL HISTORY: Past Medical History:  Diagnosis Date  . Acid reflux   . Arthritis   . Asthma   . Chronic pain    with leg and back pain (disc problem)  . COPD (chronic obstructive pulmonary disease) ([place])   . Diabetes mellitus without complication ([place])   . Headache    HX OF  . History of upper GI x-ray series    to follow showed large duodenal ulcer H pylori serologies were negative  . Hypertension   . Pneumonia    07/17/17  . Pre-diabetes   . Stroke [facility])    TIA MINI STROKE  . Tubular adenoma     PAST SURGICAL HISTORY: Past Surgical History:  Procedure Laterality Date  . BACK SURGERY  7/05; 5/09    Dr.Hirsch,3 lumbar  X3  . BACK SURGERY    . COLONOSCOPY  Feb 2012   Dr. Gala Romney: normal rectum, pedunculate polyp removed but not recovered  . COLONOSCOPY WITH ESOPHAGOGASTRODUODENOSCOPY (EGD) N/A 11/18/2013   Dr.Rourk- tcs= normal rectum, multipe polyps about the ileocecal valve and distal transverse colon o/w the remainder of the colonic mucosa appeared  normal bx= tubular adenoma. EGD= normal esophagus, stomach with scattered erosions mottling, friablility, no ulcer or infiltrating process patent pylorus bx= chronic inflammation. next TCS 11/2016  . COLONOSCOPY WITH PROPOFOL N/A 05/25/2017   Dr. Gala Romney: sigmoid diverticulosis, one 4 mm hyerplastic rectal polyp, ascending colonic AVMs surveillance 2023  . ESOPHAGOGASTRODUODENOSCOPY  1/06   Dr. Volney American esophageal erosions,U-shaped stomach,marked erosions and edema of the bulb without discrete ulcer  disease.   . ESOPHAGOGASTRODUODENOSCOPY (EGD) WITH PROPOFOL N/A 05/25/2017   Dr. Gala Romney: reflux esophagitis s/p empiric dilation, normal stomach and duodenum  . MALONEY DILATION N/A 05/25/2017   Procedure: Venia Minks DILATION;  Surgeon: Daneil Dolin, MD;  Location: [facility] ENDO SUITE;  Service: Endoscopy;  Laterality: N/A;  . NECK SURGERY  10/2007   PLATE IN NECK  . POLYPECTOMY  05/25/2017   Procedure: POLYPECTOMY;  Surgeon: Daneil Dolin, MD;  Location: [facility] END[facility] SUITE;  Service: Endoscopy;;  rectal  . TRANSURETHRAL RESECTION OF PROSTATE N/A 08/17/2017   Procedure: TRANSURETHRAL RESECTION OF THE PROSTATE (TURP);  Surgeon: Irine Seal, MD;  Location: [facility] ORS;  Service: Urology;  Laterality: N/A;    FAMILY HISTORY: The patient family history includes Asthma in his mother; Cancer in his father.   SOCIAL HISTORY:  The patient  reports that he quit smoking about 5 years ago. His smoking use included cigarettes. He has a 20.00 pack-year smoking history. He has never used smokeless tobacco. He reports current drug use. Drug: Marijuana. He reports that he does not drink alcohol.  MEDICATIONS: Medications Prior to Admission  Medication Sig Dispense Refill Last Dose  . acetaminophen (TYLENOL) 325 MG tablet Take 2 tablets (650 mg total) by mouth every 6 (six) hours as needed for mild pain (or Fever >/= 101). 12 tablet 0 11/22/2019 at Unknown time  . albuterol (PROVENTIL) (2.5 MG/3ML) 0.083% nebulizer solution Take 3 mLs (2.5 mg total) by nebulization every 4 (four) hours as needed for wheezing or shortness of breath. 75 mL 5 11/23/2019 at Unknown time  . albuterol (VENTOLIN HFA) 108 (90 Base) MCG/ACT inhaler Inhale 2 puffs into the lungs every 4 (four) hours as needed for wheezing or shortness of breath. Do not use with nebulizer 18 g 2 11/23/2019 at Unknown time  . Fluticasone-Umeclidin-Vilant (TRELEGY ELLIPTA) 100-62.5-25 MCG/INH AEPB Inhale 1 puff into the lungs daily. 28 each 5 11/23/2019 at Unknown time  .  gabapentin (NEURONTIN) 300 MG capsule Take 1 capsule (300 mg total) by mouth 2 (two) times daily. Pt. Says he is taking twice daily. (Patient taking differently: Take 300 mg by mouth 2 (two) times daily. )   11/23/2019 at Unknown time  . guaiFENesin (MUCINEX) 600 MG 12 hr tablet Take 1 tablet (600 mg total) by mouth 2 (two) times daily. (Patient taking differently: Take 600 mg by mouth daily. ) 60 tablet 2 11/23/2019 at Unknown time  . linaclotide (LINZESS) 72 MCG capsule Take 1 capsule (72 mcg total) by mouth daily before breakfast. 90 capsule 3 Past Week at Unknown time  . OXYGEN Inhale 2.5 L into the lungs daily.   11/23/2019 at Unknown time  . polyethylene glycol powder (MIRALAX) 17 GM/SCOOP powder Take 17 g by mouth daily as needed (FOR CONSTIPATION).    unknown  . aspirin EC 81 MG tablet Take 81 mg by mouth daily.   Not Taking at Unknown time  . pantoprazole (PROTONIX) 40 MG tablet Take 1 tablet (40 mg total) by mouth daily before breakfast. (Patient not taking: Reported on 11/23/2019) 90 tablet  3 Not Taking at Unknown time  . predniSONE (STERAPRED UNI-PAK 21 TAB) 10 MG (21) TBPK tablet Take by mouth daily. Take 6 tabs by mouth daily  for 2 days, then 5 tabs for 2 days, then 4 tabs for 2 days, then 3 tabs for 2 days, 2 tabs for 2 days, then 1 tab by mouth daily for 2 days (Patient not taking: Reported on 11/23/2019) 42 tablet 0 Not Taking at Unknown time    Prior to Admission medications   Medication Sig Start Date End Date Taking? Authorizing Provider  acetaminophen (TYLENOL) 325 MG tablet Take 2 tablets (650 mg total) by mouth every 6 (six) hours as needed for mild pain (or Fever >/= 101). 06/14/19  Yes Emokpae, Courage, MD  albuterol (PROVENTIL) (2.5 MG/3ML) 0.083% nebulizer solution Take 3 mLs (2.5 mg total) by nebulization every 4 (four) hours as needed for wheezing or shortness of breath. 06/14/19  Yes Emokpae, Courage, MD  albuterol (VENTOLIN HFA) 108 (90 Base) MCG/ACT inhaler Inhale 2 puffs into  the lungs every 4 (four) hours as needed for wheezing or shortness of breath. Do not use with nebulizer 06/14/19  Yes Emokpae, Courage, MD  Fluticasone-Umeclidin-Vilant (TRELEGY ELLIPTA) 100-62.5-25 MCG/INH AEPB Inhale 1 puff into the lungs daily. 06/14/19  Yes Emokpae, Courage, MD  gabapentin (NEURONTIN) 300 MG capsule Take 1 capsule (300 mg total) by mouth 2 (two) times daily. Pt. Says he is taking twice daily. Patient taking differently: Take 300 mg by mouth 2 (two) times daily.  06/14/19  Yes Emokpae, Courage, MD  guaiFENesin (MUCINEX) 600 MG 12 hr tablet Take 1 tablet (600 mg total) by mouth 2 (two) times daily. Patient taking differently: Take 600 mg by mouth daily.  06/14/19 06/13/20 Yes Roxan Hockey, MD  linaclotide (LINZESS) 72 MCG capsule Take 1 capsule (72 mcg total) by mouth daily before breakfast. 06/14/19  Yes Emokpae, Courage, MD  OXYGEN Inhale 2.5 L into the lungs daily.   Yes [provider]  polyethylene glycol powder (MIRALAX) 17 GM/SCOOP powder Take 17 g by mouth daily as needed (FOR CONSTIPATION).  11/14/18  Yes [provider]  aspirin EC 81 MG tablet Take 81 mg by mouth daily.    [provider]  pantoprazole (PROTONIX) 40 MG tablet Take 1 tablet (40 mg total) by mouth daily before breakfast. Patient not taking: Reported on 11/23/2019 02/15/19   [name], NP  predniSONE (STERAPRED UNI-PAK 21 TAB) 10 MG (21) TBPK tablet Take by mouth daily. Take 6 tabs by mouth daily  for 2 days, then 5 tabs for 2 days, then 4 tabs for 2 days, then 3 tabs for 2 days, 2 tabs for 2 days, then 1 tab by mouth daily for 2 days Patient not taking: Reported on 11/23/2019 11/09/19   Albrizze, Harley Hallmark, PA-C    . heparin 650 Units/hr (11/23/19 2124)    Current Outpatient Medications  Medication Instructions  . acetaminophen (TYLENOL) 650 mg, Oral, Every 6 hours PRN  . albuterol (PROVENTIL) 2.5 mg, Nebulization, Every 4 hours PRN  . albuterol (VENTOLIN HFA) 108 (90 Base)  MCG/ACT inhaler 2 puffs, Inhalation, Every 4 hours PRN, Do not use with nebulizer  . aspirin EC 81 mg, Oral, Daily  . Fluticasone-Umeclidin-Vilant (TRELEGY ELLIPTA) 100-62.5-25 MCG/INH AEPB 1 puff, Inhalation, Daily  . gabapentin (NEURONTIN) 300 mg, Oral, 2 times daily, Pt. Says he is taking twice daily.  Marland Kitchen guaiFENesin (MUCINEX) 600 mg, Oral, 2 times daily  . linaclotide (LINZESS) 72 mcg, Oral,  Daily before breakfast  . OXYGEN 2.5 L, Inhalation, Daily  . pantoprazole (PROTONIX) 40 mg, Oral, Daily before breakfast  . polyethylene glycol powder (MIRALAX) 17 g, Oral, Daily PRN  . predniSONE (STERAPRED UNI-PAK 21 TAB) 10 MG (21) TBPK tablet Oral, Daily, Take 6 tabs by mouth daily  for 2 days, then 5 tabs for 2 days, then 4 tabs for 2 days, then 3 tabs for 2 days, 2 tabs for 2 days, then 1 tab by mouth daily for 2 days    14 ORGAN REVIEW OF SYSTEMS: CONSTITUTIONAL: No fever or significant weight loss EYES: No recent significant visual change EARS, NOSE, MOUTH, THROAT: No recent significant change in hearing CARDIOVASCULAR: See discussion in subjective/HPI RESPIRATORY: See discussion in subjective/HPI GASTROINTESTINAL: No recent complaints of abdominal pain GENITOURINARY: No recent significant change in genitourinary status MUSCULOSKELETAL: No recent significant change in musculoskeletal status INTEGUMENTARY: No recent rash NEUROLOGIC: No recent significant change in motor function PSYCHIATRIC: No recent significant change in mood ENDOCRINOLOGIC: No recent significant change in endocrine status HEMATOLOGIC/LYMPHATIC: No recent significant unexpected bruising ALLERGIC/IMMUNOLOGIC: No recent unexplained allergic reaction  PHYSICAL EXAM: Vitals with BMI 11/24/2019 11/24/2019 11/24/2019  Height - - _0   Weight - - 122 lbs 10 oz  BMI - - 89.21  Systolic 194 174 081  Diastolic 90 87 88  Pulse 448 91 103    CONSTITUTIONAL: Well-developed and well-nourished. No acute distress.  SKIN: Skin is  warm and dry. No rash noted. No cyanosis. No pallor. No jaundice HEAD: Normocephalic and atraumatic.  EYES: No scleral icterus MOUTH/THROAT: Moist oral membranes.  NECK: No JVD present. No thyromegaly noted. No carotid bruits  LYMPHATIC: No visible cervical adenopathy.  CHEST Normal respiratory effort. No intercostal retractions  LUNGS: Clear to auscultation bilaterally with in the upper lung fields.  Decreased breath sounds at the bases.  No stridor. No wheezes. No rales.  CARDIOVASCULAR: Regular, positive S1-S2, no murmurs rubs or gallops appreciated. ABDOMINAL: Soft, nontender, nondistended, positive bowel sounds in all 4 quadrants, no apparent ascites.  EXTREMITIES: No peripheral edema  HEMATOLOGIC: No significant bruising NEUROLOGIC: Oriented to person, place, and time. Nonfocal. Normal muscle tone.  PSYCHIATRIC: Normal mood and affect. Normal behavior. Cooperative  RADIOLOGY: CT Angio Chest PE W and/or Wo Contrast  Result Date: 11/23/2019 CLINICAL DATA:  Mid chest pain, shortness of breath and headache x3 days. EXAM: CT ANGIOGRAPHY CHEST WITH CONTRAST TECHNIQUE: Multidetector CT imaging of the chest was performed using the standard protocol during bolus administration of intravenous contrast. Multiplanar CT image reconstructions and MIPs were obtained to evaluate the vascular anatomy. CONTRAST:  171m OMNIPAQUE IOHEXOL 350 MG/ML SOLN COMPARISON:  August 02, 2019 FINDINGS: Cardiovascular: Satisfactory opacification of the pulmonary arteries to the segmental level. No evidence of pulmonary embolism. Normal heart size. No pericardial effusion. Moderate to marked severity coronary artery calcification is seen. This is present on the prior study. Mediastinum/Nodes: No enlarged mediastinal, hilar, or axillary lymph nodes. Thyroid gland, trachea, and esophagus demonstrate no significant findings. Lungs/Pleura: A trace amount of atelectasis is seen within the inferior aspect of the left upper lobe.  There is no evidence of acute infiltrate, pleural effusion or pneumothorax. Upper Abdomen: No acute abnormality. Musculoskeletal: A metallic density fusion plate and screws are seen along the anterior aspect of the lower cervical spine. Review of the MIP images confirms the above findings. IMPRESSION: 1. No CT evidence of pulmonary embolism or acute cardiopulmonary disease. 2. Moderate to marked severity coronary artery calcification. 3. Prior cervical spine  fusion. Electronically Signed   By: Virgina Norfolk M.D.   On: 11/23/2019 19:33   DG Chest Portable 1 View  Result Date: 11/23/2019 CLINICAL DATA:  Headache and chest pain. EXAM: PORTABLE CHEST 1 VIEW COMPARISON:  November 09, 2019 FINDINGS: The lungs are hyperinflated. There is no evidence of acute infiltrate, pleural effusion or pneumothorax. The heart size and mediastinal contours are within normal limits. A radiopaque fusion plate and screws are seen overlying the lower cervical spine. The visualized skeletal structures are otherwise unremarkable. IMPRESSION: No active disease. Electronically Signed   By: Virgina Norfolk M.D.   On: 11/23/2019 18:06   ECHOCARDIOGRAM COMPLETE  Result Date: 11/24/2019    ECHOCARDIOGRAM REPORT   Patient Name:   Bryan [name] Date of Exam: 11/24/2019 Medical Rec #:  [id number]   Height:       63.0 in Accession #:    [id number]  Weight:       122.6 lb Date of Birth:  1956/03/28   BSA:          1.571 m Patient Age:    72 years    BP:           153/90 mmHg Patient Gender: M           HR:           100 bpm. Exam Location:  Inpatient Procedure: 2D Echo, Cardiac Doppler and Color Doppler Indications:    NSTEMI  History:        Patient has prior history of Echocardiogram examinations, most                 recent 06/13/2019. Signs/Symptoms:Chest Pain and Shortness of                 Breath; Risk Factors:Hypertension.  Sonographer:    Dustin Flock Referring Phys: [id number] [place]  1. Left ventricular ejection  fraction, by estimation, is 55 to 60%. The left ventricle has normal function. The left ventricle has no regional wall motion abnormalities. Left ventricular diastolic parameters are consistent with Grade I diastolic dysfunction (impaired relaxation). There is the interventricular septum is flattened in systole and diastole, consistent with right ventricular pressure and volume overload.  2. Right ventricular systolic function is low normal. The right ventricular size is mildly enlarged. mildly increased right ventricular wall thickness. There is moderately elevated pulmonary artery systolic pressure. The estimated right ventricular systolic pressure is 52.8 mmHg.  3. Right atrial size was mild to moderately dilated.  4. The mitral valve is grossly normal. Trivial mitral valve regurgitation.  5. Tricuspid valve regurgitation is mild to moderate.  6. The aortic valve is tricuspid. Aortic valve regurgitation is not visualized.  7. The inferior vena cava is normal in size with greater than 50% respiratory variability, suggesting right atrial pressure of 3 mmHg. Comparison(s): Changes from prior study are noted. 06/13/2019 - LVEF 60-65%, severe pulmonary hypertension - RVSP 70 mmHg. FINDINGS  Left Ventricle: Left ventricular ejection fraction, by estimation, is 55 to 60%. The left ventricle has normal function. The left ventricle has no regional wall motion abnormalities. The left ventricular internal cavity size was normal in size. There is  no left ventricular hypertrophy. The interventricular septum is flattened in systole and diastole, consistent with right ventricular pressure and volume overload. Left ventricular diastolic parameters are consistent with Grade I diastolic dysfunction (impaired relaxation). Indeterminate filling pressures. Right Ventricle: The right ventricular size is mildly enlarged. Mildly increased right ventricular wall thickness.  Right ventricular systolic function is low normal. There is  moderately elevated pulmonary artery systolic pressure. The tricuspid regurgitant velocity is 3.79 m/s, and with an assumed right atrial pressure of 3 mmHg, the estimated right ventricular systolic pressure is 34.7 mmHg. Left Atrium: Left atrial size was normal in size. Right Atrium: Right atrial size was mild to moderately dilated. Pericardium: There is no evidence of pericardial effusion. Mitral Valve: The mitral valve is grossly normal. Trivial mitral valve regurgitation. Tricuspid Valve: The tricuspid valve is grossly normal. Tricuspid valve regurgitation is mild to moderate. Aortic Valve: The aortic valve is tricuspid. Aortic valve regurgitation is not visualized. Pulmonic Valve: The pulmonic valve was grossly normal. Pulmonic valve regurgitation is not visualized. Aorta: The aortic root, ascending aorta, aortic arch and descending aorta are all structurally normal, with no evidence of dilitation or obstruction. Venous: The inferior vena cava is normal in size with greater than 50% respiratory variability, suggesting right atrial pressure of 3 mmHg. IAS/Shunts: No atrial level shunt detected by color flow Doppler.  LEFT VENTRICLE PLAX 2D LVIDd:         3.90 cm  Diastology LVIDs:         2.70 cm  LV e' lateral:   5.22 cm/s LV PW:         1.00 cm  LV E/e' lateral: 12.0 LV IVS:        0.90 cm  LV e' medial:    6.20 cm/s LVOT diam:     1.60 cm  LV E/e' medial:  10.1 LV SV:         36 LV SV Index:   23 LVOT Area:     2.01 cm  RIGHT VENTRICLE RV Basal diam:  2.80 cm RV S prime:     12.70 cm/s TAPSE (M-mode): 2.0 cm LEFT ATRIUM             Index       RIGHT ATRIUM           Index LA diam:        2.20 cm 1.40 cm/m  RA Area:     17.10 cm LA Vol (A2C):   21.1 ml 13.43 ml/m RA Volume:   54.90 ml  34.96 ml/m LA Vol (A4C):   30.1 ml 19.17 ml/m LA Biplane Vol: 27.1 ml 17.26 ml/m  AORTIC VALVE LVOT Vmax:   103.00 cm/s LVOT Vmean:  66.300 cm/s LVOT VTI:    0.180 m  AORTA Ao Root diam: 2.70 cm MITRAL VALVE                TRICUSPID VALVE MV Area (PHT): 5.31 cm    TR Peak grad:   57.5 mmHg MV Decel Time: 143 msec    TR Vmax:        379.00 cm/s MV E velocity: 62.60 cm/s MV A velocity: 82.30 cm/s  SHUNTS MV E/A ratio:  0.76        Systemic VTI:  0.18 m                            Systemic Diam: 1.60 cm Lyman Bishop MD Electronically signed by Lyman Bishop MD Signature Date/Time: 11/24/2019/1:25:19 PM    Final    LABORATORY DATA: Lab Results  Component Value Date   WBC 13.1 (H) 11/24/2019   HGB 14.8 11/24/2019   HCT 47.9 11/24/2019   MCV 86.9 11/24/2019   PLT 191 11/24/2019  Recent Labs  Lab 11/23/19 1728 11/23/19 1728 11/24/19 0915  NA 140   < > 136  K 4.7   < > 5.5*  CL 101   < > 97*  CO2 32   < > 30  BUN 21   < > 19  CREATININE 1.59*   < > 1.50*  CALCIUM 8.9   < > 9.3  PROT 7.0  --   --   BILITOT 0.5  --   --   ALKPHOS 74  --   --   ALT 26  --   --   AST 24  --   --   GLUCOSE 102*   < > 129*   < > = values in this interval not displayed.    Lipid Panel     Component Value Date/Time   CHOL 164 07/23/2017 0403   TRIG 69 07/23/2017 0403   HDL 47 07/23/2017 0403   CHOLHDL 3.5 07/23/2017 0403   VLDL 14 07/23/2017 0403   LDLCALC 103 (H) 07/23/2017 0403    BNP (last 3 results) Recent Labs    08/02/19 1526 11/09/19 1823 11/23/19 1728  BNP 83.0 85.0 105.0*    HEMOGLOBIN A1C Lab Results  Component Value Date   HGBA1C 6.7 (H) 11/24/2019   MPG 145.59 11/24/2019    Cardiac Panel (last 3 results) High sensitive troponin: 11/23/2019: 28 1728 11/23/2019: 29  2043 11/23/2019: 32  2233 11/24/2019: 26  0018  TSH No results for input(s): TSH in the last 8760 hours.   Scheduled Meds: . amLODipine  5 mg Oral Daily  . fluticasone furoate-vilanterol  1 puff Inhalation Daily  . gabapentin  300 mg Oral BID  . hydrALAZINE  25 mg Oral Q8H  . isosorbide dinitrate  20 mg Oral TID  . [START ON 11/25/2019] linaclotide  72 mcg Oral QAC breakfast  . umeclidinium bromide  1 puff Inhalation Daily    Continuous Infusions: . heparin 650 Units/hr (11/23/19 2124)   PRN Meds:.acetaminophen, hydrALAZINE, ipratropium-albuterol, nitroGLYCERIN, ondansetron (ZOFRAN) IV  CARDIAC DATABASE: EKG: 11/23/2019 1724: Sinus tachycardia ventricular rate of 107 bpm, right axis deviation, no myocardial injury pattern, wandering baseline in leads II, 3, V5-V6.  Echocardiogram: 06/13/2019: LVEF 60-65%, no LVH, indeterminate diastolic dysfunction, right ventricular size is mildly enlarged, ventricular septal flattening in systole consistent with RV pressure overload, moderately dilated right atrium, mild TR, severe pulmonary hypertension, PASP 70 mmHg.  IMPRESSION: Non-STEMI secondary to supply demand ischemia from uncontrolled hypertension and noncompliance with oxygen therapy. Angina pectoris. Coronary artery calcification, noted on nongated CT. Hypertension with chronic kidney disease stage III, uncontrolled. Chronic kidney disease stage III Oxygen dependent COPD. Former smoker. History of alcohol use. Severe pulmonary hypertension, per echocardiogram.  RECOMMENDATIONS: DONIVAN THAMMAVONG is a 64 y.o. male whose past medical history and cardiovascular risk factors include: Hypertension, oxygen dependent COPD, prediabetes, coronary calcification.  Non-STEMI, suggestive of supply demand ischemia.  Patient's chest pain is typical for angina pectoris on admission but it was in the setting of uncontrolled hypertension and not using oxygen therapy despite his history of oxygen dependent COPD.  Cardiac biomarkers reviewed.   EKG did not show any signs of underlying injury pattern.  Patient is currently on IV heparin, recommend 48 hours and then stop.  At the time of evaluation patient is chest pain-free.  Echocardiogram pending to reevaluate LV function, regional wall motion abnormalities, and RVSP.  Given this nongated CT findings of coronary artery calcification, clinical presentation, and multiple  cardiovascular risk  factors he does need an ischemic evaluation.  Since the patient is chest pain-free would recommend modifying cardiovascular risk factors and initiating medical therapy.  Patient is medication nave in his tx of high blood pressure which is uncontrolled.  Start Isordil and hydralazine combination given his serum creatinine level.  Start Norvasc.  Currently not started on AV nodal blocking agents because he has history of illicit drug use and ventricular function is currently unknown.  If patient continues to have chest discomfort despite initiating and up titration of medications we did discuss undergoing diagnostic left heart catheterization with possible intervention to evaluate for coronary artery disease and right heart catheterization to evaluate hemodynamics prior to discharge.  After reviewing the risks, complications, and alternatives of left and right heart catheterization patient would like to hold off on any invasive procedures at the current time.  If his symptoms improve with medical therapy, LV function is preserved per echocardiogram, plans will be to schedule him for a stress test outpatient prior to discharge.  If change in clinical status or progression of symptoms recommend inpatient work-up, patient agreeable.  We will continue to follow  Check fasting lipid profile.  Check hemoglobin A1c  Echocardiogram pending.  Coronary artery calcification: See above  Benign essential hypertension with chronic kidney disease stage III:  Patient's systolic blood pressures since admission have been ranging between 137-228 mmHg.  Started on Isordil and hydralazine combination and Norvasc.  Patient is neither on nor does he have access to phosphodiesterase inhibitors such as Viagra/sildenafil, Cialis/tadalafil, Levitra/vardenafil.  We did discuss the drug to drug interactions with the use of nitrates and phosphodiesterase inhibitors.  And if he starts this class  of medications he should stop Isordil.  Patient verbalized understanding.  Check UDS to rule out illicit drug use prior to starting beta-blocker therapy.  Former smoker: Educated on importance of continued smoking cessation.  Oxygen dependent COPD: Currently managed by primary team.  Patient is encouraged in regards to oxygen therapy compliance.  Evaluation and management (43mn) with time spent obtaining history, performing exam, reviewing labs, studies, greater than 50% of that time was spent in counseling and coordination care with the patient regarding complex decision making and discussion as state above, and documenting clinical evaluation.  Patient's questions and concerns were addressed to his satisfaction. He voices understanding of the instructions provided during this encounter.   This note was created using a voice recognition software as a result there may be grammatical errors inadvertently enclosed that do not reflect the nature of this encounter. Every attempt is made to correct such errors.  SRex Kras DO, FHeritage VillageCardiovascular. PRemerOffice: [phone]/14/2021, 1:30 PM

## 2019-11-24 NOTE — Progress Notes (Signed)
  Echocardiogram 2D Echocardiogram has been performed.  Bryan [name] 11/24/2019, 12:09 PM

## 2019-11-24 NOTE — Progress Notes (Signed)
I spoke with Dr. Faythe Dingwall about the patient and he said that patient will be seen in the morning.  Patient is already n.p.o. after midnight.  Nitroglycerin as needed for chest pain ordered and also IV hydralazine 10 mg ordered if the systolic blood pressure is above 210 or diastolic blood pressure above 105.

## 2019-11-24 NOTE — Progress Notes (Signed)
ANTICOAGULATION CONSULT NOTE   Pharmacy Consult for Heparin Indication: chest pain/ACS  No Known Allergies  Patient Measurements: Height: _0  (160 cm) Weight: 122 lb 9.6 oz (55.6 kg) IBW/kg (Calculated) : 56.9 Heparin Dosing Weight: 56.7 kg  Vital Signs: Temp: 99.1 F (37.3 C) (03/14 0847) Temp Source: Oral (03/14 0847) BP: 153/90 (03/14 0847) Pulse Rate: 100 (03/14 0847)  Labs: Recent Labs    11/23/19 1728 11/23/19 1728 11/23/19 2043 11/23/19 2233 11/24/19 0018 11/24/19 0528 11/24/19 0915 11/24/19 1316  HGB 14.2  --   --   --   --  14.8  --   --   HCT 46.1  --   --   --   --  47.9  --   --   PLT 179  --   --   --   --  191  --   --   HEPARINUNFRC  --   --   --   --   --  0.54  --  0.45  CREATININE 1.59*  --   --   --   --   --  1.50*  --   TROPONINIHS 28*   < > 29* 32* 26*  --   --   --    < > = values in this interval not displayed.    Estimated Creatinine Clearance: 39.6 mL/min (A) (by C-G formula based on SCr of 1.5 mg/dL (H)).   Medical History: Past Medical History:  Diagnosis Date  . Acid reflux   . Arthritis   . Asthma   . Chronic pain    with leg and back pain (disc problem)  . COPD (chronic obstructive pulmonary disease) ([place])   . Diabetes mellitus without complication ([place])   . Headache    HX OF  . History of upper GI x-ray series    to follow showed large duodenal ulcer H pylori serologies were negative  . Hypertension   . Pneumonia    07/17/17  . Pre-diabetes   . Stroke [facility])    TIA MINI STROKE  . Tubular adenoma     Assessment: 74 yom presenting with worsening chest pain with deep breathing, hx COPD. No AC PTA.   Heparin level remains therapeutic on drip rate of 650 units/hr. CBC was stable and WNL this AM. No overt bleeding noted.  Goal of Therapy:  Heparin level 0.3-0.7 units/ml Monitor platelets by anticoagulation protocol: Yes   Plan:  Continue heparin IV at 650 units/hr Daily HL and CBC Monitor for s/sxs  bleeding  Richardine Service, PharmD PGY1 Pharmacy Resident Phone: [phone] 11/24/2019  1:44 PM  Please check [email or web address] for unit-specific pharmacy phone numbers.

## 2019-11-25 ENCOUNTER — Telehealth: Payer: Self-pay

## 2019-11-25 DIAGNOSIS — K269 Duodenal ulcer, unspecified as acute or chronic, without hemorrhage or perforation: Secondary | ICD-10-CM | POA: Diagnosis present

## 2019-11-25 DIAGNOSIS — K2970 Gastritis, unspecified, without bleeding: Secondary | ICD-10-CM | POA: Diagnosis present

## 2019-11-25 DIAGNOSIS — I2510 Atherosclerotic heart disease of native coronary artery without angina pectoris: Secondary | ICD-10-CM | POA: Diagnosis not present

## 2019-11-25 DIAGNOSIS — E875 Hyperkalemia: Secondary | ICD-10-CM | POA: Diagnosis not present

## 2019-11-25 DIAGNOSIS — K529 Noninfective gastroenteritis and colitis, unspecified: Secondary | ICD-10-CM | POA: Diagnosis present

## 2019-11-25 DIAGNOSIS — K315 Obstruction of duodenum: Secondary | ICD-10-CM | POA: Diagnosis not present

## 2019-11-25 DIAGNOSIS — Z20822 Contact with and (suspected) exposure to covid-19: Secondary | ICD-10-CM | POA: Diagnosis not present

## 2019-11-25 DIAGNOSIS — I21A1 Myocardial infarction type 2: Secondary | ICD-10-CM | POA: Diagnosis not present

## 2019-11-25 DIAGNOSIS — E119 Type 2 diabetes mellitus without complications: Secondary | ICD-10-CM

## 2019-11-25 DIAGNOSIS — J449 Chronic obstructive pulmonary disease, unspecified: Secondary | ICD-10-CM | POA: Diagnosis present

## 2019-11-25 DIAGNOSIS — R1310 Dysphagia, unspecified: Secondary | ICD-10-CM | POA: Diagnosis present

## 2019-11-25 DIAGNOSIS — E869 Volume depletion, unspecified: Secondary | ICD-10-CM | POA: Diagnosis not present

## 2019-11-25 DIAGNOSIS — K311 Adult hypertrophic pyloric stenosis: Secondary | ICD-10-CM | POA: Diagnosis not present

## 2019-11-25 DIAGNOSIS — K259 Gastric ulcer, unspecified as acute or chronic, without hemorrhage or perforation: Secondary | ICD-10-CM | POA: Diagnosis present

## 2019-11-25 DIAGNOSIS — I214 Non-ST elevation (NSTEMI) myocardial infarction: Secondary | ICD-10-CM | POA: Diagnosis not present

## 2019-11-25 DIAGNOSIS — K2100 Gastro-esophageal reflux disease with esophagitis, without bleeding: Secondary | ICD-10-CM | POA: Diagnosis present

## 2019-11-25 DIAGNOSIS — N179 Acute kidney failure, unspecified: Secondary | ICD-10-CM | POA: Diagnosis not present

## 2019-11-25 DIAGNOSIS — I130 Hypertensive heart and chronic kidney disease with heart failure and stage 1 through stage 4 chronic kidney disease, or unspecified chronic kidney disease: Secondary | ICD-10-CM | POA: Diagnosis not present

## 2019-11-25 DIAGNOSIS — N1832 Chronic kidney disease, stage 3b: Secondary | ICD-10-CM | POA: Diagnosis not present

## 2019-11-25 DIAGNOSIS — I5033 Acute on chronic diastolic (congestive) heart failure: Secondary | ICD-10-CM | POA: Diagnosis not present

## 2019-11-25 DIAGNOSIS — E1122 Type 2 diabetes mellitus with diabetic chronic kidney disease: Secondary | ICD-10-CM | POA: Diagnosis not present

## 2019-11-25 DIAGNOSIS — N1831 Chronic kidney disease, stage 3a: Secondary | ICD-10-CM | POA: Diagnosis not present

## 2019-11-25 DIAGNOSIS — R072 Precordial pain: Secondary | ICD-10-CM | POA: Diagnosis present

## 2019-11-25 DIAGNOSIS — I2721 Secondary pulmonary arterial hypertension: Secondary | ICD-10-CM | POA: Diagnosis present

## 2019-11-25 DIAGNOSIS — D62 Acute posthemorrhagic anemia: Secondary | ICD-10-CM | POA: Diagnosis not present

## 2019-11-25 DIAGNOSIS — K209 Esophagitis, unspecified without bleeding: Secondary | ICD-10-CM | POA: Diagnosis not present

## 2019-11-25 DIAGNOSIS — I2723 Pulmonary hypertension due to lung diseases and hypoxia: Secondary | ICD-10-CM | POA: Diagnosis present

## 2019-11-25 DIAGNOSIS — J9621 Acute and chronic respiratory failure with hypoxia: Secondary | ICD-10-CM | POA: Diagnosis not present

## 2019-11-25 DIAGNOSIS — E782 Mixed hyperlipidemia: Secondary | ICD-10-CM

## 2019-11-25 DIAGNOSIS — I129 Hypertensive chronic kidney disease with stage 1 through stage 4 chronic kidney disease, or unspecified chronic kidney disease: Secondary | ICD-10-CM | POA: Diagnosis not present

## 2019-11-25 DIAGNOSIS — E1165 Type 2 diabetes mellitus with hyperglycemia: Secondary | ICD-10-CM | POA: Diagnosis not present

## 2019-11-25 DIAGNOSIS — K449 Diaphragmatic hernia without obstruction or gangrene: Secondary | ICD-10-CM | POA: Diagnosis present

## 2019-11-25 LAB — CBC
HCT: 46.4 % (ref 39.0–52.0)
Hemoglobin: 14.3 g/dL (ref 13.0–17.0)
MCH: 27 pg (ref 26.0–34.0)
MCHC: 30.8 g/dL (ref 30.0–36.0)
MCV: 87.5 fL (ref 80.0–100.0)
Platelets: 204 10*3/uL (ref 150–400)
RBC: 5.3 MIL/uL (ref 4.22–5.81)
RDW: 19.8 % — ABNORMAL HIGH (ref 11.5–15.5)
WBC: 17.4 10*3/uL — ABNORMAL HIGH (ref 4.0–10.5)
nRBC: 0 % (ref 0.0–0.2)

## 2019-11-25 LAB — C DIFFICILE QUICK SCREEN W PCR REFLEX
C Diff antigen: NEGATIVE
C Diff interpretation: NOT DETECTED
C Diff toxin: NEGATIVE

## 2019-11-25 LAB — BASIC METABOLIC PANEL
Anion gap: 15 (ref 5–15)
BUN: 35 mg/dL — ABNORMAL HIGH (ref 8–23)
CO2: 33 mmol/L — ABNORMAL HIGH (ref 22–32)
Calcium: 9.2 mg/dL (ref 8.9–10.3)
Chloride: 92 mmol/L — ABNORMAL LOW (ref 98–111)
Creatinine, Ser: 2.17 mg/dL — ABNORMAL HIGH (ref 0.61–1.24)
GFR calc Af Amer: 36 mL/min — ABNORMAL LOW (ref >60)
GFR calc non Af Amer: 31 mL/min — ABNORMAL LOW (ref >60)
Glucose, Bld: 123 mg/dL — ABNORMAL HIGH (ref 70–99)
Potassium: 4.2 mmol/L (ref 3.5–5.1)
Sodium: 140 mmol/L (ref 135–145)

## 2019-11-25 LAB — GLUCOSE, CAPILLARY
Glucose-Capillary: 155 mg/dL — ABNORMAL HIGH (ref 70–99)
Glucose-Capillary: 102 mg/dL — ABNORMAL HIGH (ref 70–99)

## 2019-11-25 LAB — HEPARIN LEVEL (UNFRACTIONATED): Heparin Unfractionated: 0.39 IU/mL (ref 0.30–0.70)

## 2019-11-25 MED ORDER — SODIUM CHLORIDE 0.9 % IV BOLUS
500.0000 mL | Freq: Once | INTRAVENOUS | Status: AC
  Administered 2019-11-25: 08:00:00 500 mL via INTRAVENOUS

## 2019-11-25 MED ORDER — PANTOPRAZOLE SODIUM 40 MG PO TBEC: 40.0000 mg | DELAYED_RELEASE_TABLET | Freq: Every day | ORAL | Status: DC

## 2019-11-25 MED ORDER — PANTOPRAZOLE SODIUM 40 MG PO TBEC
40.0000 mg | DELAYED_RELEASE_TABLET | Freq: Every day | ORAL | Status: DC
  Administered 2019-11-25 – 2019-11-27 (×3): 40 mg via ORAL
  Filled 2019-11-25 (×3): qty 1

## 2019-11-25 MED ORDER — SODIUM CHLORIDE 0.9 % IV SOLN: INTRAVENOUS | Status: DC

## 2019-11-25 NOTE — Progress Notes (Signed)
Progress Note  Patient Name: Bryan [name] Date of Encounter: 11/25/2019  Attending physician: Geradine Girt, DO Primary care provider: Rosita Fire, MD Primary Cardiologist:  Consultant:Jacquel Redditt Terri Skains  Subjective: Bryan [name] is a 64 y.o. male whose past medical history and cardiac risk factors include: Hypertension, oxygen dependent COPD,  non-insulin-dependent diabetes mellitus type 2, coronary calcification. He presented to the hospital with a chief complaint of " chest pain and shortness of breath."  Patient seen and examined at bedside at approximately 7:50 AM Patient states that he was doing well yesterday until 3 AM when he had 2 bowel movement and since then he has been feeling lightheaded and dizzy and vital signs noted hypotension. Patient denies any chest pain at rest or with effort related activities, no shortness of breath at rest or with effort related activities, no orthopnea, paroxysmal nocturnal dyspnea or lower extremity swelling. Case discussed and reviewed with his nurse.  Objective: Vital Signs in the last 24 hours: Temp:  [98.2 F (36.8 C)-98.7 F (37.1 C)] 98.4 F (36.9 C) (03/15 0732) Pulse Rate:  [79-100] 91 (03/15 0732) Resp:  [16-20] 18 (03/15 0732) BP: (68-134)/(52-92) 80/64 (03/15 0732) SpO2:  [94 %-100 %] 100 % (03/15 0732) Weight:  [54 kg] 54 kg (03/15 0628)  Intake/Output from previous day: 03/14 0701 - 03/15 0700 In: 720 [P.O.:720] Out: 950 [Urine:950]  Weights:  Filed Weights   11/23/19 1728 11/24/19 0120 11/25/19 0628  Weight: 56.7 kg 55.6 kg 54 kg    Telemetry: Personally reviewed.  Normal sinus rhythm without ectopy  Physical examination: CONSTITUTIONAL: Well-developed and well-nourished. No acute distress.  SKIN: Skin is warm and dry. No rash noted. No cyanosis. No pallor. No jaundice HEAD: Normocephalic and atraumatic.  EYES: No scleral icterus MOUTH/THROAT: Moist oral membranes.  NECK: No JVD present. No thyromegaly noted. No  carotid bruits  LYMPHATIC: No visible cervical adenopathy.  CHEST Normal respiratory effort. No intercostal retractions  LUNGS: Clear to auscultation bilaterally with in the upper lung fields.  Decreased breath sounds at the bases.  No stridor. No wheezes. No rales.  CARDIOVASCULAR: Regular, positive S1-S2, no murmurs rubs or gallops appreciated. ABDOMINAL: Soft, nontender, nondistended, positive bowel sounds in all 4 quadrants, no apparent ascites.  EXTREMITIES: No peripheral edema  HEMATOLOGIC: No significant bruising NEUROLOGIC: Oriented to person, place, and time. Nonfocal. Normal muscle tone.  PSYCHIATRIC: Normal mood and affect. Normal behavior. Cooperative  Lab Results: Hematology Recent Labs  Lab 11/23/19 1728 11/24/19 0528 11/25/19 0532  WBC 12.7* 13.1* 17.4*  RBC 5.25 5.51 5.30  HGB 14.2 14.8 14.3  HCT 46.1 47.9 46.4  MCV 87.8 86.9 87.5  MCH 27.0 26.9 27.0  MCHC 30.8 30.9 30.8  RDW 20.4* 20.1* 19.8*  PLT 179 191 204    Chemistry Recent Labs  Lab 11/23/19 1728 11/24/19 0915 11/25/19 0532  NA 140 136 140  K 4.7 5.5* 4.2  CL 101 97* 92*  CO2 32 30 33*  GLUCOSE 102* 129* 123*  BUN 21 19 35*  CREATININE 1.59* 1.50* 2.17*  CALCIUM 8.9 9.3 9.2  PROT 7.0  --   --   ALBUMIN 3.4*  --   --   AST 24  --   --   ALT 26  --   --   ALKPHOS 74  --   --   BILITOT 0.5  --   --   GFRNONAA 46* 49* 31*  GFRAA 53* 57* 36*  ANIONGAP 7 9 15  Hepatic Function Panel  Recent Labs    06/13/19 1015 10/06/19 1007 11/23/19 1728  PROT 7.6 7.7 7.0  ALBUMIN 3.8 3.8 3.4*  AST _0 ALT _1 ALKPHOS 100 92 74  BILITOT 0.5 0.4 0.5    Cardiac Enzymes: No results for input(s): CKTOTAL, CKMB, RELINDX in the last 8760 hours.  Invalid input(s): TROPONIN I Lab Results  Component Value Date   CKTOTAL 271 (H) 04/15/2011   CKMB 3.9 04/15/2011   TROPONINI <0.03 01/29/2019     BNP Recent Labs  Lab 11/23/19 1728  BNP 105.0*     DDimer No results for input(s):  DDIMER in the last 168 hours.   Hemoglobin A1c:  Lab Results  Component Value Date   HGBA1C 6.7 (H) 11/24/2019   MPG 145.59 11/24/2019    TSH No results for input(s): TSH in the last 8760 hours.  Lipid Panel     Component Value Date/Time   CHOL 241 (H) 11/24/2019 1316   TRIG 45 11/24/2019 1316   HDL 86 11/24/2019 1316   CHOLHDL 2.8 11/24/2019 1316   VLDL 9 11/24/2019 1316   LDLCALC 146 (H) 11/24/2019 1316    Imaging: CT Angio Chest PE W and/or Wo Contrast  Result Date: 11/23/2019 CLINICAL DATA:  Mid chest pain, shortness of breath and headache x3 days. EXAM: CT ANGIOGRAPHY CHEST WITH CONTRAST TECHNIQUE: Multidetector CT imaging of the chest was performed using the standard protocol during bolus administration of intravenous contrast. Multiplanar CT image reconstructions and MIPs were obtained to evaluate the vascular anatomy. CONTRAST:  169m OMNIPAQUE IOHEXOL 350 MG/ML SOLN COMPARISON:  August 02, 2019 FINDINGS: Cardiovascular: Satisfactory opacification of the pulmonary arteries to the segmental level. No evidence of pulmonary embolism. Normal heart size. No pericardial effusion. Moderate to marked severity coronary artery calcification is seen. This is present on the prior study. Mediastinum/Nodes: No enlarged mediastinal, hilar, or axillary lymph nodes. Thyroid gland, trachea, and esophagus demonstrate no significant findings. Lungs/Pleura: A trace amount of atelectasis is seen within the inferior aspect of the left upper lobe. There is no evidence of acute infiltrate, pleural effusion or pneumothorax. Upper Abdomen: No acute abnormality. Musculoskeletal: A metallic density fusion plate and screws are seen along the anterior aspect of the lower cervical spine. Review of the MIP images confirms the above findings. IMPRESSION: 1. No CT evidence of pulmonary embolism or acute cardiopulmonary disease. 2. Moderate to marked severity coronary artery calcification. 3. Prior cervical spine  fusion. Electronically Signed   By: TVirgina NorfolkM.D.   On: 11/23/2019 19:33   DG Chest Portable 1 View  Result Date: 11/23/2019 CLINICAL DATA:  Headache and chest pain. EXAM: PORTABLE CHEST 1 VIEW COMPARISON:  November 09, 2019 FINDINGS: The lungs are hyperinflated. There is no evidence of acute infiltrate, pleural effusion or pneumothorax. The heart size and mediastinal contours are within normal limits. A radiopaque fusion plate and screws are seen overlying the lower cervical spine. The visualized skeletal structures are otherwise unremarkable. IMPRESSION: No active disease. Electronically Signed   By: TVirgina NorfolkM.D.   On: 11/23/2019 18:06   ECHOCARDIOGRAM COMPLETE  Result Date: 11/24/2019    ECHOCARDIOGRAM REPORT   Patient Name:   Bryan HOOSEDate of Exam: 11/24/2019 Medical Rec #:  [id number]  Height:       63.0 in Accession #:    [id number] Weight:       122.6 lb Date of Birth:  [birth year]  BSA:  1.571 m Patient Age:    18 years    BP:           153/90 mmHg Patient Gender: M           HR:           100 bpm. Exam Location:  Inpatient Procedure: 2D Echo, Cardiac Doppler and Color Doppler Indications:    NSTEMI  History:        Patient has prior history of Echocardiogram examinations, most                 recent 06/13/2019. Signs/Symptoms:Chest Pain and Shortness of                 Breath; Risk Factors:Hypertension.  Sonographer:    Dustin Flock Referring Phys: [id number] Newman  1. Left ventricular ejection fraction, by estimation, is 55 to 60%. The left ventricle has normal function. The left ventricle has no regional wall motion abnormalities. Left ventricular diastolic parameters are consistent with Grade I diastolic dysfunction (impaired relaxation). There is the interventricular septum is flattened in systole and diastole, consistent with right ventricular pressure and volume overload.  2. Right ventricular systolic function is low normal. The right  ventricular size is mildly enlarged. mildly increased right ventricular wall thickness. There is moderately elevated pulmonary artery systolic pressure. The estimated right ventricular systolic pressure is 16.6 mmHg.  3. Right atrial size was mild to moderately dilated.  4. The mitral valve is grossly normal. Trivial mitral valve regurgitation.  5. Tricuspid valve regurgitation is mild to moderate.  6. The aortic valve is tricuspid. Aortic valve regurgitation is not visualized.  7. The inferior vena cava is normal in size with greater than 50% respiratory variability, suggesting right atrial pressure of 3 mmHg. Comparison(s): Changes from prior study are noted. 06/13/2019 - LVEF 60-65%, severe pulmonary hypertension - RVSP 70 mmHg. FINDINGS  Left Ventricle: Left ventricular ejection fraction, by estimation, is 55 to 60%. The left ventricle has normal function. The left ventricle has no regional wall motion abnormalities. The left ventricular internal cavity size was normal in size. There is  no left ventricular hypertrophy. The interventricular septum is flattened in systole and diastole, consistent with right ventricular pressure and volume overload. Left ventricular diastolic parameters are consistent with Grade I diastolic dysfunction (impaired relaxation). Indeterminate filling pressures. Right Ventricle: The right ventricular size is mildly enlarged. Mildly increased right ventricular wall thickness. Right ventricular systolic function is low normal. There is moderately elevated pulmonary artery systolic pressure. The tricuspid regurgitant velocity is 3.79 m/s, and with an assumed right atrial pressure of 3 mmHg, the estimated right ventricular systolic pressure is 06.3 mmHg. Left Atrium: Left atrial size was normal in size. Right Atrium: Right atrial size was mild to moderately dilated. Pericardium: There is no evidence of pericardial effusion. Mitral Valve: The mitral valve is grossly normal. Trivial mitral  valve regurgitation. Tricuspid Valve: The tricuspid valve is grossly normal. Tricuspid valve regurgitation is mild to moderate. Aortic Valve: The aortic valve is tricuspid. Aortic valve regurgitation is not visualized. Pulmonic Valve: The pulmonic valve was grossly normal. Pulmonic valve regurgitation is not visualized. Aorta: The aortic root, ascending aorta, aortic arch and descending aorta are all structurally normal, with no evidence of dilitation or obstruction. Venous: The inferior vena cava is normal in size with greater than 50% respiratory variability, suggesting right atrial pressure of 3 mmHg. IAS/Shunts: No atrial level shunt detected by color flow Doppler.  LEFT VENTRICLE PLAX 2D  LVIDd:         3.90 cm  Diastology LVIDs:         2.70 cm  LV e' lateral:   5.22 cm/s LV PW:         1.00 cm  LV E/e' lateral: 12.0 LV IVS:        0.90 cm  LV e' medial:    6.20 cm/s LVOT diam:     1.60 cm  LV E/e' medial:  10.1 LV SV:         36 LV SV Index:   23 LVOT Area:     2.01 cm  RIGHT VENTRICLE RV Basal diam:  2.80 cm RV S prime:     12.70 cm/s TAPSE (M-mode): 2.0 cm LEFT ATRIUM             Index       RIGHT ATRIUM           Index LA diam:        2.20 cm 1.40 cm/m  RA Area:     17.10 cm LA Vol (A2C):   21.1 ml 13.43 ml/m RA Volume:   54.90 ml  34.96 ml/m LA Vol (A4C):   30.1 ml 19.17 ml/m LA Biplane Vol: 27.1 ml 17.26 ml/m  AORTIC VALVE LVOT Vmax:   103.00 cm/s LVOT Vmean:  66.300 cm/s LVOT VTI:    0.180 m  AORTA Ao Root diam: 2.70 cm MITRAL VALVE               TRICUSPID VALVE MV Area (PHT): 5.31 cm    TR Peak grad:   57.5 mmHg MV Decel Time: 143 msec    TR Vmax:        379.00 cm/s MV E velocity: 62.60 cm/s MV A velocity: 82.30 cm/s  SHUNTS MV E/A ratio:  0.76        Systemic VTI:  0.18 m                            Systemic Diam: 1.60 cm Lyman Bishop MD Electronically signed by Lyman Bishop MD Signature Date/Time: 11/24/2019/1:25:19 PM    Final     Cardiac database: EKG: 11/23/2019 1724: Sinus tachycardia  ventricular rate of 107 bpm, right axis deviation, no myocardial injury pattern, wandering baseline in leads II, 3, V5-V6.  Echocardiogram: 06/13/2019: LVEF 60-65%, no LVH, indeterminate diastolic dysfunction, right ventricular size is mildly enlarged, ventricular septal flattening in systole consistent with RV pressure overload, moderately dilated right atrium, mild TR, severe pulmonary hypertension, PASP 70 mmHg.  11/24/2019: LVEF 12-16%, grade 1 diastolic impairment, interventricular septum flattened in systole and diastole consistent with RV pressure and volume overload, moderate pulmonary hypertension, RVSP 60.5 mmHg, moderately dilated right atrium, mild to moderate TR.  Scheduled Meds: . atorvastatin  40 mg Oral q1800  . fluticasone furoate-vilanterol  1 puff Inhalation Daily  . gabapentin  300 mg Oral BID  . linaclotide  72 mcg Oral QAC breakfast  . umeclidinium bromide  1 puff Inhalation Daily    Continuous Infusions: . sodium chloride 100 mL/hr at 11/25/19 0937  . heparin 650 Units/hr (11/23/19 2124)    PRN Meds: acetaminophen, alum & mag hydroxide-simeth, hydrALAZINE, ipratropium-albuterol, nitroGLYCERIN, ondansetron (ZOFRAN) IV   IMPRESSION: Non-STEMI secondary to supply demand ischemia from uncontrolled hypertension and noncompliance with oxygen therapy. Angina pectoris: Resolved Coronary artery calcification, noted on nongated CT: Chronic and stable Hypertension with chronic kidney disease stage III:  Improving, currently hypotensive Non-insulin-dependent diabetes mellitus type 2. Mixed hyperlipidemia Chronic kidney disease stage III Oxygen dependent COPD. Former smoker. History of alcohol use. Severe pulmonary hypertension, per echocardiogram.  RECOMMENDATIONS: SOMNANG MAHAN is a 64 y.o. male whose past medical history and cardiac risk factors include: Hypertension, oxygen dependent COPD,  non-insulin-dependent diabetes mellitus type 2, coronary  calcification.  Non-STEMI, suggestive of supply demand ischemia.  Patient's chest pain is typical for angina pectoris on admission but it was in the setting of uncontrolled hypertension and not using oxygen therapy despite his history of oxygen dependent COPD.  Cardiac biomarkers reviewed.   EKG did not show any signs of underlying injury pattern.  Patient is currently on IV heparin, recommend 48 hours and then stop.  At the time of evaluation patient is chest pain-free.  Echocardiogram notes a preserved left ventricular systolic function and no significant change compared to prior study.  Patient's hemoglobin A1c is consistent with diabetes mellitus type 2.  His cholesterol level is not well controlled with LDL of 146 mg/dL.  Given his clinical presentation and multiple cardiovascular risk factors recommended an ischemic evaluation.  Discussed both stress test or diagnostic left heart catheterization to define his coronary anatomy and disease burden.  Patient states that he does not want to undergo heart catheterization during this admission.  He is willing to have a stress test done as outpatient once discharged.  Coronary artery calcification: See above  Benign essential hypertension with chronic kidney disease stage III:  Patient presented with uncontrolled hypertension during his admission.  He was started on antihypertensive medications and was doing well until this morning.  Patient became hypotensive after having couple watery bowel movements and continues to remain symptomatic in regards to experiencing dizziness.    500 cc bolus of normal saline ordered.  His antihypertensive medications have been discontinued/managed by his attending physician.  Patient's systolic blood pressures since admission have been ranging between 137-228 mmHg.  Started on Isordil and hydralazine combination and Norvasc.  With watery bowel movements, hypotension, elevated white count, patient  should be worked up for SIRS/sepsis.  Currently managed her primary team.  Non-insulin-dependent diabetes mellitus type 2:  Hemoglobin A1c greater than 6.5  Currently managed by primary team.  Mixed hyperlipidemia:  Most recent lipid profile reviewed.  Started on the Lipitor 40 mg p.o. nightly.  Former smoker: Educated on importance of continued smoking cessation.  Oxygen dependent COPD: Currently managed by primary team.  Patient is encouraged in regards to oxygen therapy compliance.  Personally called the attending physician to review his plan of care from a cardiovascular standpoint.  Nursing staff has also been updated during morning rounds.  We will continue to follow the patient with you for now.  Patient's questions and concerns were addressed to his satisfaction. He voices understanding of the instructions provided during this encounter.   This note was created using a voice recognition software as a result there may be grammatical errors inadvertently enclosed that do not reflect the nature of this encounter. Every attempt is made to correct such errors.  Rex Kras, DO, [facility]. [place] Office: [phone] 11/25/2019, 9:49 AM

## 2019-11-25 NOTE — TOC Initial Note (Signed)
Transition of Care [facility]) - Initial/Assessment Note    Patient Details  Name: Bryan [name] MRN: [id number] Date of Birth: May 14, 1956  Transition of Care [facility]: Lambert Rhonda W) CM/SW Contact:    Zenon Mayo, RN Phone Number: 11/25/2019, 4:27 PM  Clinical Narrative:                 Patient form home with son, he has home oxygen with [employer], he states he needs a rolling walker.  NCM made referral to [employer] with [employer] for rolling walker, it will be delivered to room prior to dc.  Patient states he uses [employer], he will have transportation, has no issue with getting meds.  Expected Discharge Plan: Home/Self Care Barriers to Discharge: Continued Medical Work up   Patient Goals and CMS Choice Patient states their goals for this hospitalization and ongoing recovery are:: get better      Expected Discharge Plan and Services Expected Discharge Plan: Home/Self Care   Discharge Planning Services: CM Consult Post Acute Care Choice: Durable Medical Equipment Living arrangements for the past 2 months: Single Family Home                 DME Arranged: Walker rolling DME Agency: [employer] Date DME Agency Contacted: 11/25/19 Time DME Agency Contacted: 74 Representative spoke with at DME Agency: [place] Arranged: NA          Prior Living Arrangements/Services Living arrangements for the past 2 months: [place] Lives with:: Adult Children Patient language and need for interpreter reviewed:: Yes Do you feel safe going back to the place where you live?: Yes      Need for Family Participation in Patient Care: No (Comment) Care giver support system in place?: Yes (comment) Current home services: DME(has home oxygen with [employer]) Criminal Activity/Legal Involvement Pertinent to Current Situation/Hospitalization: No - Comment as needed  Activities of Daily Living Home Assistive Devices/Equipment: Oxygen, CBG Meter, Cane (specify quad or straight) ADL Screening (condition at time  of admission) Patient's cognitive ability adequate to safely complete daily activities?: Yes Is the patient deaf or have difficulty hearing?: No Does the patient have difficulty seeing, even when wearing glasses/contacts?: No Does the patient have difficulty concentrating, remembering, or making decisions?: No Patient able to express need for assistance with ADLs?: Yes Does the patient have difficulty dressing or bathing?: No Independently performs ADLs?: Yes (appropriate for developmental age) Does the patient have difficulty walking or climbing stairs?: Yes Weakness of Legs: None Weakness of Arms/Hands: None  Permission Sought/Granted                  Emotional Assessment   Attitude/Demeanor/Rapport: Engaged Affect (typically observed): Appropriate Orientation: : Oriented to Self, Oriented to Place, Oriented to  Time, Oriented to Situation Alcohol / Substance Use: Not Applicable Psych Involvement: No (comment)  Admission diagnosis:  Precordial chest pain [R07.2] SOB (shortness of breath) [R06.02] Chest pain [R07.9] NSTEMI (non-ST elevated myocardial infarction) [facility]) [I21.4] Patient Active Problem List   Diagnosis Date Noted  . NSTEMI (non-ST elevated myocardial infarction) ([place]) 11/25/2019  . Non-insulin dependent type 2 diabetes mellitus ([place])   . Mixed hyperlipidemia   . Non-ST elevation (NSTEMI) myocardial infarction ([place]) 11/24/2019  . Oxygen dependent 11/24/2019  . Former smoker 11/24/2019  . Coronary artery calcification seen on CAT scan   . Angina pectoris ([place]) 11/23/2019  . SOB (shortness of breath) 11/23/2019  . Chronic diastolic CHF (congestive heart failure) ([place]) 08/03/2019  . Acute on chronic respiratory failure (  [place]) 08/02/2019  . Acute exacerbation of CHF (congestive heart failure) /HFpEF (EF 60%) 06/13/2019  . Normocytic anemia 02/15/2019  . COPD exacerbation ([place]) 09/16/2018  . Hypokalemia 06/26/2018  . Hypomagnesemia 06/26/2018  . Anemia  06/26/2018  . BPH (benign prostatic hyperplasia) 06/26/2018  . Chronic respiratory failure with hypoxia ([place]) 06/04/2018  . HCAP (healthcare-associated pneumonia) 06/04/2018  . Acute respiratory failure with hypoxia ([place]) 04/14/2018  . Pulmonary nodule 04/14/2018  . CKD (chronic kidney disease) stage 3, GFR 30-59 ml/min 04/14/2018  . Type 2 diabetes mellitus with hyperlipidemia ([place]) 03/12/2018  . BPH with obstruction/lower urinary tract symptoms 08/17/2017  . Left sided numbness 07/22/2017  . TIA (transient ischemic attack) 07/22/2017  . Pneumonia 07/14/2017  . COPD with acute exacerbation ([place]) 07/14/2017  . Constipation 07/10/2017  . Hx of adenomatous colonic polyps 05/01/2017  . Benign hypertension with CKD (chronic kidney disease) stage III   . Chronic pain   . Heme positive stool 10/20/2014  . Epigastric pain 06/19/2014  . Dysphagia 06/19/2014  . AP (abdominal pain) 11/13/2013  . Early satiety 11/13/2013  . COPD (chronic obstructive pulmonary disease) ([place]) 02/22/2013  . Low back pain 11/19/2012  . COLITIS 05/12/2009  . ABDOMINAL PAIN, LEFT LOWER QUADRANT 05/12/2009  . DUODENAL ULCER, HX OF 05/12/2009  . H/o Alcohol abuse- Quit in 2010 05/11/2009  . GERD 05/11/2009  . Diarrhea 05/11/2009  . SPONDYLOSIS, CERVICAL 07/03/2007  . NECK PAIN 07/03/2007   PCP:  Rosita Fire, MD Pharmacy:   [facility] [address] [place] [address] Phone: [phone] Fax: [phone]  [facility] - [address], [place] [address] Phone: [phone] Fax: [phone]     Social Determinants of Health (SDOH) Interventions    Readmission Risk Interventions Readmission Risk Prevention Plan 11/25/2019  Transportation Screening Complete  PCP or Specialist Appt within 3-5 Days Complete  HRI or [place] Complete   Social Work Consult for [place] Planning/Counseling Complete  Palliative Care Screening Not Applicable  Medication Review Press [employer]) Complete  Some recent data might be hidden

## 2019-11-25 NOTE — Progress Notes (Signed)
Per MAR, heparin is to be stop, called the pharmacist Alycia Rossetti to clarified, per pharmacist, okay to stop. Heparin has been stop.

## 2019-11-25 NOTE — Progress Notes (Signed)
ANTICOAGULATION CONSULT NOTE   Pharmacy Consult for Heparin Indication: chest pain/ACS  No Known Allergies  Patient Measurements: Height: _0  (160 cm) Weight: 119 lb 1.6 oz (54 kg) IBW/kg (Calculated) : 56.9 Heparin Dosing Weight: 56.7 kg  Vital Signs: Temp: 98.4 F (36.9 C) (03/15 0732) Temp Source: Oral (03/15 0732) BP: 80/64 (03/15 0732) Pulse Rate: 91 (03/15 0732)  Labs: Recent Labs    11/23/19 1728 11/23/19 1728 11/23/19 2043 11/23/19 2233 11/24/19 0018 11/24/19 0528 11/24/19 0915 11/24/19 1316 11/25/19 0532  HGB 14.2   < >  --   --   --  14.8  --   --  14.3  HCT 46.1  --   --   --   --  47.9  --   --  46.4  PLT 179  --   --   --   --  191  --   --  204  HEPARINUNFRC  --   --   --   --   --  0.54  --  0.45 0.39  CREATININE 1.59*  --   --   --   --   --  1.50*  --  2.17*  TROPONINIHS 28*   < > 29* 32* 26*  --   --   --   --    < > = values in this interval not displayed.    Estimated Creatinine Clearance: 26.6 mL/min (A) (by C-G formula based on SCr of 2.17 mg/dL (H)).   Medical History: Past Medical History:  Diagnosis Date  . Acid reflux   . Arthritis   . Asthma   . Chronic pain    with leg and back pain (disc problem)  . COPD (chronic obstructive pulmonary disease) ([place])   . Diabetes mellitus without complication ([place])   . Headache    HX OF  . History of upper GI x-ray series    to follow showed large duodenal ulcer H pylori serologies were negative  . Hypertension   . Pneumonia    07/17/17  . Pre-diabetes   . Stroke [facility])    TIA MINI STROKE  . Tubular adenoma     Assessment: 89 yom presenting with worsening chest pain with deep breathing, hx COPD. No AC PTA.   Heparin level remains therapeutic this AM on 650 units/hr, no bleeding reported.  Goal of Therapy:  Heparin level 0.3-0.7 units/ml Monitor platelets by anticoagulation protocol: Yes   Plan:  Continue heparin gtt at 650 units/hr Daily heparin level, CBC, s/s bleeding F/u  LOT/cards plan  Bertis Ruddy, PharmD Clinical Pharmacist Please check AMION for all [place] numbers 11/25/2019 8:23 AM

## 2019-11-25 NOTE — Progress Notes (Signed)
Progress Note    Bryan [name]  GBE:[id number] DOB: 10-28-1955  DOA: 11/23/2019 PCP: Rosita Fire, MD    Brief Narrative:     Medical records reviewed and are as summarized below:  Bryan [name] is an 64 y.o. male who was brought in with chest pain and shortness of breath.  Patient initially presented to [facility] but was transferred down to [facility] for cardiology evaluation.  Denies any further chest pain but overnight developed multiple episodes of watery stools.  Assessment/Plan:   Principal Problem:   Non-ST elevation (NSTEMI) myocardial infarction [facility]) Active Problems:   H/o Alcohol abuse- Quit in 2010   Benign hypertension with CKD (chronic kidney disease) stage III   CKD (chronic kidney disease) stage 3, GFR 30-59 ml/min   Angina pectoris (HCC)   SOB (shortness of breath)   Oxygen dependent   Former smoker   Coronary artery calcification seen on CAT scan   Non-insulin dependent type 2 diabetes mellitus ([place])   Mixed hyperlipidemia   Chest pain/NSTEMI -Telemetry -CTA was done at [facility] that does not show PE or any other cardiopulmonary disease but does have marked severity of coronary artery calcification -Troponins mildly elevated -Continue IV heparin for 48 hours -Nitroglycerin sublingual as needed for chest pain. -Cardiology consult appreciated: Discussed with Dr. Terri Skains: Plan is for outpatient work-up including stress test.  Multiple episodes of diarrhea -Patient was restarted on Linzess but states he was not regularly taking this at home -Patient states 4 to 5 days prior to admission he took a dose of milk of magnesia due to constipation -GI pathogen panel and C. difficile sent this a.m.  Elevated blood sugar with a hemoglobin A1c of 6.7 -Will encourage diabetic diet -Patient states he has been on recent steroids so I wonder if this has increased his hemoglobin A1c -Defer to PCP    Hypertension -I have held all of his blood  pressure medicines as now he is hypotensive with diarrhea  AKI on CKD -stage IIIa -Renal function worsening due to multiple episodes of diarrhea and now volume depletion IV fluids    SOB (shortness of breath) Shortness of breath is improved at this time.    DuoNeb as needed for shortness of breath.   Chronic respiratory failure -Per MAR review wears 2.5 L of oxygen at home  Hyperkalemia -Resolved  Family Communication/Anticipated D/C date and plan/Code Status   DVT prophylaxis: Heparin drip Code Status: Full Code.  Family Communication:  Disposition Plan: Patient has since developed acute kidney injury, diarrhea and hypotension.  Plan to treat those issues and patient will have outpatient cardiology follow-up for stress test.  Hope for discharge in the next 24 to 48 hours   Medical Consultants:    Cardiology    Subjective:   Patient states he has had multiple episodes of watery stools overnight.  At home he was constipated and took some milk of magnesia but this was 4 to 5 days prior to admission  Objective:    Vitals:   11/25/19 0628 11/25/19 0732 11/25/19 1041 11/25/19 1212  BP:  (!) 80/64 103/72 106/75  Pulse:  91 88 93  Resp:  _0 Temp:  98.4 F (36.9 C) 99.2 F (37.3 C) 99.1 F (37.3 C)  TempSrc:  Oral Oral Oral  SpO2:  100% 100% 93%  Weight: 54 kg     Height:        Intake/Output Summary (Last 24 hours) at  11/25/2019 1218 Last data filed at 11/25/2019 0553 Gross per 24 hour  Intake 720 ml  Output 450 ml  Net 270 ml   Filed Weights   11/23/19 1728 11/24/19 0120 11/25/19 0628  Weight: 56.7 kg 55.6 kg 54 kg    Exam: In bed, no acute distress Regular rate and rhythm No increased work of breathing, on nasal cannula Does not appear markedly volume overloaded Alert and oriented x3  Data Reviewed:   I have personally reviewed following labs and imaging studies:  Labs: Labs show the following:   Basic Metabolic Panel: Recent Labs  Lab  11/23/19 1728 11/23/19 1728 11/24/19 0915 11/25/19 0532  NA 140  --  136 140  K 4.7   < > 5.5* 4.2  CL 101  --  97* 92*  CO2 32  --  30 33*  GLUCOSE 102*  --  129* 123*  BUN 21  --  19 35*  CREATININE 1.59*  --  1.50* 2.17*  CALCIUM 8.9  --  9.3 9.2   < > = values in this interval not displayed.   GFR Estimated Creatinine Clearance: 26.6 mL/min (A) (by C-G formula based on SCr of 2.17 mg/dL (H)). Liver Function Tests: Recent Labs  Lab 11/23/19 1728  AST 24  ALT 26  ALKPHOS 74  BILITOT 0.5  PROT 7.0  ALBUMIN 3.4*   No results for input(s): LIPASE, AMYLASE in the last 168 hours. No results for input(s): AMMONIA in the last 168 hours. Coagulation profile No results for input(s): INR, PROTIME in the last 168 hours.  CBC: Recent Labs  Lab 11/23/19 1728 11/24/19 0528 11/25/19 0532  WBC 12.7* 13.1* 17.4*  NEUTROABS 8.7*  --   --   HGB 14.2 14.8 14.3  HCT 46.1 47.9 46.4  MCV 87.8 86.9 87.5  PLT 179 191 204   Cardiac Enzymes: No results for input(s): CKTOTAL, CKMB, CKMBINDEX, TROPONINI in the last 168 hours. BNP (last 3 results) No results for input(s): PROBNP in the last 8760 hours. CBG: Recent Labs  Lab 11/23/19 2214 11/25/19 0447  GLUCAP 161* 155*   D-Dimer: No results for input(s): DDIMER in the last 72 hours. Hgb A1c: Recent Labs    11/24/19 1316  HGBA1C 6.7*   Lipid Profile: Recent Labs    11/24/19 1316  CHOL 241*  HDL 86  LDLCALC 146*  TRIG 45  CHOLHDL 2.8   Thyroid function studies: No results for input(s): TSH, T4TOTAL, T3FREE, THYROIDAB in the last 72 hours.  Invalid input(s): FREET3 Anemia work up: No results for input(s): VITAMINB12, FOLATE, FERRITIN, TIBC, IRON, RETICCTPCT in the last 72 hours. Sepsis Labs: Recent Labs  Lab 11/23/19 1728 11/24/19 0528 11/25/19 0532  WBC 12.7* 13.1* 17.4*    Microbiology Recent Results (from the past 240 hour(s))  Respiratory Panel by RT PCR (Flu A&B, Covid) - Nasopharyngeal Swab      Status: None   Collection Time: 11/23/19  8:28 PM   Specimen: Nasopharyngeal Swab  Result Value Ref Range Status   SARS Coronavirus 2 by RT PCR NEGATIVE NEGATIVE Final    Comment: (NOTE) SARS-CoV-2 target nucleic acids are NOT DETECTED. The SARS-CoV-2 RNA is generally detectable in upper respiratoy specimens during the acute phase of infection. The lowest concentration of SARS-CoV-2 viral copies this assay can detect is 131 copies/mL. A negative result does not preclude SARS-Cov-2 infection and should not be used as the sole basis for treatment or other patient management decisions. A negative result may occur  with  improper specimen collection/handling, submission of specimen other than nasopharyngeal swab, presence of viral mutation(s) within the areas targeted by this assay, and inadequate number of viral copies (<131 copies/mL). A negative result must be combined with clinical observations, patient history, and epidemiological information. The expected result is Negative. Fact Sheet for Patients:  PinkCheek.be Fact Sheet for Healthcare Providers:  GravelBags.it This test is not yet ap proved or cleared by the [place] FDA and  has been authorized for detection and/or diagnosis of SARS-CoV-2 by FDA under an Emergency Use Authorization (EUA). This EUA will remain  in effect (meaning this test can be used) for the duration of the COVID-19 declaration under Section 564(b)(1) of the Act, 21 U.S.C. section 360bbb-3(b)(1), unless the authorization is terminated or revoked sooner.    Influenza A by PCR NEGATIVE NEGATIVE Final   Influenza B by PCR NEGATIVE NEGATIVE Final    Comment: (NOTE) The Xpert Xpress SARS-CoV-2/FLU/RSV assay is intended as an aid in  the diagnosis of influenza from Nasopharyngeal swab specimens and  should not be used as a sole basis for treatment. Nasal washings and  aspirates are unacceptable for  Xpert Xpress SARS-CoV-2/FLU/RSV  testing. Fact Sheet for Patients: PinkCheek.be Fact Sheet for Healthcare Providers: GravelBags.it This test is not yet approved or cleared by the [place] FDA and  has been authorized for detection and/or diagnosis of SARS-CoV-2 by  FDA under an Emergency Use Authorization (EUA). This EUA will remain  in effect (meaning this test can be used) for the duration of the  Covid-19 declaration under Section 564(b)(1) of the Act, 21  U.S.C. section 360bbb-3(b)(1), unless the authorization is  terminated or revoked. Performed at [facility], [address] [place], [place] [address]     Procedures and diagnostic studies:  CT Angio Chest PE W and/or Wo Contrast  Result Date: 11/23/2019 CLINICAL DATA:  Mid chest pain, shortness of breath and headache x3 days. EXAM: CT ANGIOGRAPHY CHEST WITH CONTRAST TECHNIQUE: Multidetector CT imaging of the chest was performed using the standard protocol during bolus administration of intravenous contrast. Multiplanar CT image reconstructions and MIPs were obtained to evaluate the vascular anatomy. CONTRAST:  154m OMNIPAQUE IOHEXOL 350 MG/ML SOLN COMPARISON:  August 02, 2019 FINDINGS: Cardiovascular: Satisfactory opacification of the pulmonary arteries to the segmental level. No evidence of pulmonary embolism. Normal heart size. No pericardial effusion. Moderate to marked severity coronary artery calcification is seen. This is present on the prior study. Mediastinum/Nodes: No enlarged mediastinal, hilar, or axillary lymph nodes. Thyroid gland, trachea, and esophagus demonstrate no significant findings. Lungs/Pleura: A trace amount of atelectasis is seen within the inferior aspect of the left upper lobe. There is no evidence of acute infiltrate, pleural effusion or pneumothorax. Upper Abdomen: No acute abnormality. Musculoskeletal: A metallic density fusion plate and screws  are seen along the anterior aspect of the lower cervical spine. Review of the MIP images confirms the above findings. IMPRESSION: 1. No CT evidence of pulmonary embolism or acute cardiopulmonary disease. 2. Moderate to marked severity coronary artery calcification. 3. Prior cervical spine fusion. Electronically Signed   By: TVirgina NorfolkM.D.   On: 11/23/2019 19:33   DG Chest Portable 1 View  Result Date: 11/23/2019 CLINICAL DATA:  Headache and chest pain. EXAM: PORTABLE CHEST 1 VIEW COMPARISON:  November 09, 2019 FINDINGS: The lungs are hyperinflated. There is no evidence of acute infiltrate, pleural effusion or pneumothorax. The heart size and mediastinal contours are within normal limits. A radiopaque fusion plate  and screws are seen overlying the lower cervical spine. The visualized skeletal structures are otherwise unremarkable. IMPRESSION: No active disease. Electronically Signed   By: Virgina Norfolk M.D.   On: 11/23/2019 18:06   ECHOCARDIOGRAM COMPLETE  Result Date: 11/24/2019    ECHOCARDIOGRAM REPORT   Patient Name:   Bryan [name] Date of Exam: 11/24/2019 Medical Rec #:  [id number]   Height:       63.0 in Accession #:    [id number]  Weight:       122.6 lb Date of Birth:  Jun 02, 1956   BSA:          1.571 m Patient Age:    72 years    BP:           153/90 mmHg Patient Gender: M           HR:           100 bpm. Exam Location:  Inpatient Procedure: 2D Echo, Cardiac Doppler and Color Doppler Indications:    NSTEMI  History:        Patient has prior history of Echocardiogram examinations, most                 recent 06/13/2019. Signs/Symptoms:Chest Pain and Shortness of                 Breath; Risk Factors:Hypertension.  Sonographer:    Dustin Flock Referring Phys: [id number] [place]  1. Left ventricular ejection fraction, by estimation, is 55 to 60%. The left ventricle has normal function. The left ventricle has no regional wall motion abnormalities. Left ventricular diastolic  parameters are consistent with Grade I diastolic dysfunction (impaired relaxation). There is the interventricular septum is flattened in systole and diastole, consistent with right ventricular pressure and volume overload.  2. Right ventricular systolic function is low normal. The right ventricular size is mildly enlarged. mildly increased right ventricular wall thickness. There is moderately elevated pulmonary artery systolic pressure. The estimated right ventricular systolic pressure is 02.4 mmHg.  3. Right atrial size was mild to moderately dilated.  4. The mitral valve is grossly normal. Trivial mitral valve regurgitation.  5. Tricuspid valve regurgitation is mild to moderate.  6. The aortic valve is tricuspid. Aortic valve regurgitation is not visualized.  7. The inferior vena cava is normal in size with greater than 50% respiratory variability, suggesting right atrial pressure of 3 mmHg. Comparison(s): Changes from prior study are noted. 06/13/2019 - LVEF 60-65%, severe pulmonary hypertension - RVSP 70 mmHg. FINDINGS  Left Ventricle: Left ventricular ejection fraction, by estimation, is 55 to 60%. The left ventricle has normal function. The left ventricle has no regional wall motion abnormalities. The left ventricular internal cavity size was normal in size. There is  no left ventricular hypertrophy. The interventricular septum is flattened in systole and diastole, consistent with right ventricular pressure and volume overload. Left ventricular diastolic parameters are consistent with Grade I diastolic dysfunction (impaired relaxation). Indeterminate filling pressures. Right Ventricle: The right ventricular size is mildly enlarged. Mildly increased right ventricular wall thickness. Right ventricular systolic function is low normal. There is moderately elevated pulmonary artery systolic pressure. The tricuspid regurgitant velocity is 3.79 m/s, and with an assumed right atrial pressure of 3 mmHg, the estimated  right ventricular systolic pressure is 09.7 mmHg. Left Atrium: Left atrial size was normal in size. Right Atrium: Right atrial size was mild to moderately dilated. Pericardium: There is no evidence of pericardial effusion. Mitral Valve: The mitral  valve is grossly normal. Trivial mitral valve regurgitation. Tricuspid Valve: The tricuspid valve is grossly normal. Tricuspid valve regurgitation is mild to moderate. Aortic Valve: The aortic valve is tricuspid. Aortic valve regurgitation is not visualized. Pulmonic Valve: The pulmonic valve was grossly normal. Pulmonic valve regurgitation is not visualized. Aorta: The aortic root, ascending aorta, aortic arch and descending aorta are all structurally normal, with no evidence of dilitation or obstruction. Venous: The inferior vena cava is normal in size with greater than 50% respiratory variability, suggesting right atrial pressure of 3 mmHg. IAS/Shunts: No atrial level shunt detected by color flow Doppler.  LEFT VENTRICLE PLAX 2D LVIDd:         3.90 cm  Diastology LVIDs:         2.70 cm  LV e' lateral:   5.22 cm/s LV PW:         1.00 cm  LV E/e' lateral: 12.0 LV IVS:        0.90 cm  LV e' medial:    6.20 cm/s LVOT diam:     1.60 cm  LV E/e' medial:  10.1 LV SV:         36 LV SV Index:   23 LVOT Area:     2.01 cm  RIGHT VENTRICLE RV Basal diam:  2.80 cm RV S prime:     12.70 cm/s TAPSE (M-mode): 2.0 cm LEFT ATRIUM             Index       RIGHT ATRIUM           Index LA diam:        2.20 cm 1.40 cm/m  RA Area:     17.10 cm LA Vol (A2C):   21.1 ml 13.43 ml/m RA Volume:   54.90 ml  34.96 ml/m LA Vol (A4C):   30.1 ml 19.17 ml/m LA Biplane Vol: 27.1 ml 17.26 ml/m  AORTIC VALVE LVOT Vmax:   103.00 cm/s LVOT Vmean:  66.300 cm/s LVOT VTI:    0.180 m  AORTA Ao Root diam: 2.70 cm MITRAL VALVE               TRICUSPID VALVE MV Area (PHT): 5.31 cm    TR Peak grad:   57.5 mmHg MV Decel Time: 143 msec    TR Vmax:        379.00 cm/s MV E velocity: 62.60 cm/s MV A velocity: 82.30  cm/s  SHUNTS MV E/A ratio:  0.76        Systemic VTI:  0.18 m                            Systemic Diam: 1.60 cm Lyman Bishop MD Electronically signed by Lyman Bishop MD Signature Date/Time: 11/24/2019/1:25:19 PM    Final     Medications:   . atorvastatin  40 mg Oral q1800  . fluticasone furoate-vilanterol  1 puff Inhalation Daily  . gabapentin  300 mg Oral BID  . umeclidinium bromide  1 puff Inhalation Daily   Continuous Infusions: . sodium chloride 100 mL/hr at 11/25/19 0937  . heparin 650 Units/hr (11/23/19 2124)     LOS: 0 days   Geradine Girt  [facility]   How to contact the [facility] Attending or Consulting provider [place] or covering provider during after hours [place], for this patient?  1. Check the care team in [facility] and look for a) attending/consulting [facility]  provider listed and b) the [facility] team listed 2. Log into [email or web address] and use [place]'s universal password to access. If you do not have the password, please contact the hospital operator. 3. Locate the [facility] provider you are looking for under [facility] and page to a number that you can be directly reached. 4. If you still have difficulty reaching the provider, please page the [facility] (Director on Call) for the Hospitalists listed on amion for assistance.  11/25/2019, 12:18 PM

## 2019-11-25 NOTE — Telephone Encounter (Signed)
I will speak to him again during hospital rounds.

## 2019-11-25 NOTE — Progress Notes (Signed)
Patient complained of feeling dizzy while on bathroom commode. After returning to bed patient's BP 68/52 Hr 90 and patient feeling dizzy. After lying down BP 88/68 HR 80 Patient feeling better.  53 Min later BP 90/69 HR 81 with patient feeling normal. On call hospitalist paged.

## 2019-11-26 LAB — GI PATHOGEN PANEL BY PCR, STOOL

## 2019-11-26 LAB — CBC
HCT: 45.8 % (ref 39.0–52.0)
Hemoglobin: 14.1 g/dL (ref 13.0–17.0)
MCH: 27 pg (ref 26.0–34.0)
MCHC: 30.8 g/dL (ref 30.0–36.0)
MCV: 87.6 fL (ref 80.0–100.0)
Platelets: 191 10*3/uL (ref 150–400)
RBC: 5.23 MIL/uL (ref 4.22–5.81)
RDW: 19.7 % — ABNORMAL HIGH (ref 11.5–15.5)
WBC: 15.7 10*3/uL — ABNORMAL HIGH (ref 4.0–10.5)
nRBC: 0 % (ref 0.0–0.2)

## 2019-11-26 LAB — BASIC METABOLIC PANEL
Anion gap: 12 (ref 5–15)
BUN: 35 mg/dL — ABNORMAL HIGH (ref 8–23)
CO2: 31 mmol/L (ref 22–32)
Calcium: 8.5 mg/dL — ABNORMAL LOW (ref 8.9–10.3)
Chloride: 94 mmol/L — ABNORMAL LOW (ref 98–111)
Creatinine, Ser: 1.69 mg/dL — ABNORMAL HIGH (ref 0.61–1.24)
GFR calc Af Amer: 49 mL/min — ABNORMAL LOW (ref >60)
GFR calc non Af Amer: 42 mL/min — ABNORMAL LOW (ref >60)
Glucose, Bld: 97 mg/dL (ref 70–99)
Potassium: 4.1 mmol/L (ref 3.5–5.1)
Sodium: 137 mmol/L (ref 135–145)

## 2019-11-26 MED ORDER — HEPARIN SODIUM (PORCINE) 5000 UNIT/ML IJ SOLN
5000.0000 [IU] | Freq: Three times a day (TID) | INTRAMUSCULAR | Status: DC
  Administered 2019-11-26 – 2019-11-27 (×3): 5000 [IU] via SUBCUTANEOUS
  Filled 2019-11-26 (×4): qty 1

## 2019-11-26 MED ORDER — LOPERAMIDE HCL 2 MG PO CAPS
2.0000 mg | ORAL_CAPSULE | ORAL | Status: DC | PRN
  Administered 2019-11-26 – 2019-11-27 (×2): 2 mg via ORAL
  Filled 2019-11-26 (×2): qty 1

## 2019-11-26 MED ORDER — LIDOCAINE VISCOUS HCL 2 % MT SOLN
15.0000 mL | Freq: Once | OROMUCOSAL | Status: AC
  Administered 2019-11-26: 09:00:00 15 mL via ORAL
  Filled 2019-11-26: qty 15

## 2019-11-26 MED ORDER — NYSTATIN 100000 UNIT/ML MT SUSP
5.0000 mL | Freq: Four times a day (QID) | OROMUCOSAL | Status: DC
  Administered 2019-11-26 – 2019-12-01 (×21): 500000 [IU] via ORAL
  Filled 2019-11-26 (×22): qty 5

## 2019-11-26 MED ORDER — ALUM & MAG HYDROXIDE-SIMETH 200-200-20 MG/5ML PO SUSP
30.0000 mL | Freq: Once | ORAL | Status: AC
  Administered 2019-11-26: 30 mL via ORAL
  Filled 2019-11-26: qty 30

## 2019-11-26 MED ORDER — CALCIUM CARBONATE ANTACID 500 MG PO CHEW
2.0000 | CHEWABLE_TABLET | Freq: Three times a day (TID) | ORAL | Status: DC | PRN
  Administered 2019-11-26 – 2019-11-27 (×2): 400 mg via ORAL
  Filled 2019-11-26 (×2): qty 2

## 2019-11-26 NOTE — Consult Note (Signed)
   [facility] CM Inpatient Consult   11/26/2019  Bryan [name] 03/29/1956 [id number]   Patient is currently active with [facility] for chronic disease management services.  Patient has been engaged by a [place] Management Coordinator for COPD management.  Also, followed by [place] Worker assistance for housing request.  Our community based plan of care has focused on disease management and community resource support.  Patient admitted with NSTEMI.  Plan: Continue to follow for disposition and needs with  Inpatient Transition Of Care [TOC] team member and to make aware that [place] Management following.   Of note, [facility] Care Management services does not replace or interfere with any services that are needed or arranged by inpatient [facility] care management team.  For additional questions or referrals please contact:  Natividad Brood, RN BSN [facility] Liaison  [phone] business mobile phone Toll free office [phone]  Fax number: [phone] [name]

## 2019-11-26 NOTE — Progress Notes (Signed)
Progress Note    Bryan [name]  KPV:[id number] DOB: [birth year]  DOA: 11/23/2019 PCP: Rosita Fire, MD    Brief Narrative:     Medical records reviewed and are as summarized below:  Bryan [name] is an 64 y.o. male who was brought in with chest pain and shortness of breath.  Patient initially presented to [facility] but was transferred down to [facility] for cardiology evaluation.  Denies any further chest pain but overnight developed multiple episodes of watery stools.  Assessment/Plan:   Principal Problem:   Non-ST elevation (NSTEMI) myocardial infarction [facility]) Active Problems:   H/o Alcohol abuse- Quit in 2010   Benign hypertension with CKD (chronic kidney disease) stage III   CKD (chronic kidney disease) stage 3, GFR 30-59 ml/min   Angina pectoris (HCC)   SOB (shortness of breath)   Oxygen dependent   Former smoker   Coronary artery calcification seen on CAT scan   Non-insulin dependent type 2 diabetes mellitus ([place])   Mixed hyperlipidemia   NSTEMI (non-ST elevated myocardial infarction) ([place])    Chest pain/NSTEMI -Telemetry -CTA was done at [facility] that does not show PE or any other cardiopulmonary disease but does have marked severity of coronary artery calcification -Troponins mildly elevated D/c heparin -Nitroglycerin sublingual as needed for chest pain. -Cardiology consult appreciated: Discussed with Dr. Terri Skains: Plan is for outpatient work-up including stress test.  Multiple episodes of diarrhea -Patient was restarted on Linzess but states he was not regularly taking this at home -Patient states 4 to 5 days prior to admission he took a dose of milk of magnesia due to constipation -GI pathogen panel pending  C. difficile negative  Elevated blood sugar with a hemoglobin A1c of 6.7 -Will encourage diabetic diet -Patient states he has been on recent steroids so I wonder if this has increased his hemoglobin A1c -Defer to PCP     Hypertension -I have held all of his blood pressure medicines as now he was hypotensive with diarrhea  AKI on CKD -stage IIIa -Renal function worsening due to multiple episodes of diarrhea and now volume depletion IV fluids with improvement    SOB (shortness of breath) Shortness of breath is improved at this time.    DuoNeb as needed for shortness of breath.   Chronic respiratory failure -Per MAR review wears 2.5 L of oxygen at home  Hyperkalemia -Resolved  Dysphagia Patient had a DG esophagus in 2019: Showed laryngeal penetration to the level of the vocal cords without aspiration -We will start PPI for GERD-like symptoms and do trial of GI cocktail -We will also do trial of nystatin swish and swallow   Family Communication/Anticipated D/C date and plan/Code Status   DVT prophylaxis: Heparin drip- d/c'd Code Status: Full Code.  Family Communication:  Disposition Plan: Monitor number of episodes of diarrhea continue to wait for GI pathogen panel   Medical Consultants:    Cardiology    Subjective:   States he is having trouble swallowing.  He states that he can put food in his mouth chew it up but is unable to swallow so instead he spits it out  Objective:    Vitals:   11/25/19 1600 11/25/19 2018 11/26/19 0337 11/26/19 0827  BP: 117/80 131/80 137/86   Pulse:  90 94   Resp: _0 Temp: 99.3 F (37.4 C) 99.6 F (37.6 C) 98.9 F (37.2 C)   TempSrc: Oral Oral Oral   SpO2: 96%  96% 94% 94%  Weight:   55.1 kg   Height:        Intake/Output Summary (Last 24 hours) at 11/26/2019 1056 Last data filed at 11/26/2019 0600 Gross per 24 hour  Intake 2517.47 ml  Output --  Net 2517.47 ml   Filed Weights   11/24/19 0120 11/25/19 0628 11/26/19 0337  Weight: 55.6 kg 54 kg 55.1 kg    Exam: In bed, no acute distress, difficult historian Regular rate and rhythm No increased work of breathing Positive bowel sounds, soft nontender  Data Reviewed:   I have  personally reviewed following labs and imaging studies:  Labs: Labs show the following:   Basic Metabolic Panel: Recent Labs  Lab 11/23/19 1728 11/23/19 1728 11/24/19 0915 11/24/19 0915 11/25/19 0532 11/26/19 0314  NA 140  --  136  --  140 137  K 4.7   < > 5.5*   < > 4.2 4.1  CL 101  --  97*  --  92* 94*  CO2 32  --  30  --  33* 31  GLUCOSE 102*  --  129*  --  123* 97  BUN 21  --  19  --  35* 35*  CREATININE 1.59*  --  1.50*  --  2.17* 1.69*  CALCIUM 8.9  --  9.3  --  9.2 8.5*   < > = values in this interval not displayed.   GFR Estimated Creatinine Clearance: 34.9 mL/min (A) (by C-G formula based on SCr of 1.69 mg/dL (H)). Liver Function Tests: Recent Labs  Lab 11/23/19 1728  AST 24  ALT 26  ALKPHOS 74  BILITOT 0.5  PROT 7.0  ALBUMIN 3.4*   No results for input(s): LIPASE, AMYLASE in the last 168 hours. No results for input(s): AMMONIA in the last 168 hours. Coagulation profile No results for input(s): INR, PROTIME in the last 168 hours.  CBC: Recent Labs  Lab 11/23/19 1728 11/24/19 0528 11/25/19 0532 11/26/19 0314  WBC 12.7* 13.1* 17.4* 15.7*  NEUTROABS 8.7*  --   --   --   HGB 14.2 14.8 14.3 14.1  HCT 46.1 47.9 46.4 45.8  MCV 87.8 86.9 87.5 87.6  PLT 179 191 204 191   Cardiac Enzymes: No results for input(s): CKTOTAL, CKMB, CKMBINDEX, TROPONINI in the last 168 hours. BNP (last 3 results) No results for input(s): PROBNP in the last 8760 hours. CBG: Recent Labs  Lab 11/23/19 2214 11/25/19 0447 11/25/19 1707  GLUCAP 161* 155* 102*   D-Dimer: No results for input(s): DDIMER in the last 72 hours. Hgb A1c: Recent Labs    11/24/19 1316  HGBA1C 6.7*   Lipid Profile: Recent Labs    11/24/19 1316  CHOL 241*  HDL 86  LDLCALC 146*  TRIG 45  CHOLHDL 2.8   Thyroid function studies: No results for input(s): TSH, T4TOTAL, T3FREE, THYROIDAB in the last 72 hours.  Invalid input(s): FREET3 Anemia work up: No results for input(s): VITAMINB12,  FOLATE, FERRITIN, TIBC, IRON, RETICCTPCT in the last 72 hours. Sepsis Labs: Recent Labs  Lab 11/23/19 1728 11/24/19 0528 11/25/19 0532 11/26/19 0314  WBC 12.7* 13.1* 17.4* 15.7*    Microbiology Recent Results (from the past 240 hour(s))  Respiratory Panel by RT PCR (Flu A&B, Covid) - Nasopharyngeal Swab     Status: None   Collection Time: 11/23/19  8:28 PM   Specimen: Nasopharyngeal Swab  Result Value Ref Range Status   SARS Coronavirus 2 by RT PCR NEGATIVE NEGATIVE Final  Comment: (NOTE) SARS-CoV-2 target nucleic acids are NOT DETECTED. The SARS-CoV-2 RNA is generally detectable in upper respiratoy specimens during the acute phase of infection. The lowest concentration of SARS-CoV-2 viral copies this assay can detect is 131 copies/mL. A negative result does not preclude SARS-Cov-2 infection and should not be used as the sole basis for treatment or other patient management decisions. A negative result may occur with  improper specimen collection/handling, submission of specimen other than nasopharyngeal swab, presence of viral mutation(s) within the areas targeted by this assay, and inadequate number of viral copies (<131 copies/mL). A negative result must be combined with clinical observations, patient history, and epidemiological information. The expected result is Negative. Fact Sheet for Patients:  PinkCheek.be Fact Sheet for Healthcare Providers:  GravelBags.it This test is not yet ap proved or cleared by the [place] FDA and  has been authorized for detection and/or diagnosis of SARS-CoV-2 by FDA under an Emergency Use Authorization (EUA). This EUA will remain  in effect (meaning this test can be used) for the duration of the COVID-19 declaration under Section 564(b)(1) of the Act, 21 U.S.C. section 360bbb-3(b)(1), unless the authorization is terminated or revoked sooner.    Influenza A by PCR NEGATIVE  NEGATIVE Final   Influenza B by PCR NEGATIVE NEGATIVE Final    Comment: (NOTE) The Xpert Xpress SARS-CoV-2/FLU/RSV assay is intended as an aid in  the diagnosis of influenza from Nasopharyngeal swab specimens and  should not be used as a sole basis for treatment. Nasal washings and  aspirates are unacceptable for Xpert Xpress SARS-CoV-2/FLU/RSV  testing. Fact Sheet for Patients: PinkCheek.be Fact Sheet for Healthcare Providers: GravelBags.it This test is not yet approved or cleared by the [place] FDA and  has been authorized for detection and/or diagnosis of SARS-CoV-2 by  FDA under an Emergency Use Authorization (EUA). This EUA will remain  in effect (meaning this test can be used) for the duration of the  Covid-19 declaration under Section 564(b)(1) of the Act, 21  U.S.C. section 360bbb-3(b)(1), unless the authorization is  terminated or revoked. Performed at [facility], [address] [place], [place] [address]   C difficile quick scan w PCR reflex     Status: None   Collection Time: 11/25/19 10:50 AM   Specimen: Stool  Result Value Ref Range Status   C Diff antigen NEGATIVE NEGATIVE Final   C Diff toxin NEGATIVE NEGATIVE Final   C Diff interpretation No C. difficile detected.  Final    Comment: Performed at [facility], [place] [address], [place], [place] [address]    Procedures and diagnostic studies:  ECHOCARDIOGRAM COMPLETE  Result Date: 11/24/2019    ECHOCARDIOGRAM REPORT   Patient Name:   Bryan [name] Date of Exam: 11/24/2019 Medical Rec #:  [id number]   Height:       63.0 in Accession #:    [id number]  Weight:       122.6 lb Date of Birth:  1955-10-29   BSA:          1.571 m Patient Age:    50 years    BP:           153/90 mmHg Patient Gender: M           HR:           100 bpm. Exam Location:  Inpatient Procedure: 2D Echo, Cardiac Doppler and Color Doppler Indications:    NSTEMI  History:  Patient has  prior history of Echocardiogram examinations, most                 recent 06/13/2019. Signs/Symptoms:Chest Pain and Shortness of                 Breath; Risk Factors:Hypertension.  Sonographer:    Dustin Flock Referring Phys: [id number] [place]  1. Left ventricular ejection fraction, by estimation, is 55 to 60%. The left ventricle has normal function. The left ventricle has no regional wall motion abnormalities. Left ventricular diastolic parameters are consistent with Grade I diastolic dysfunction (impaired relaxation). There is the interventricular septum is flattened in systole and diastole, consistent with right ventricular pressure and volume overload.  2. Right ventricular systolic function is low normal. The right ventricular size is mildly enlarged. mildly increased right ventricular wall thickness. There is moderately elevated pulmonary artery systolic pressure. The estimated right ventricular systolic pressure is 79.4 mmHg.  3. Right atrial size was mild to moderately dilated.  4. The mitral valve is grossly normal. Trivial mitral valve regurgitation.  5. Tricuspid valve regurgitation is mild to moderate.  6. The aortic valve is tricuspid. Aortic valve regurgitation is not visualized.  7. The inferior vena cava is normal in size with greater than 50% respiratory variability, suggesting right atrial pressure of 3 mmHg. Comparison(s): Changes from prior study are noted. 06/13/2019 - LVEF 60-65%, severe pulmonary hypertension - RVSP 70 mmHg. FINDINGS  Left Ventricle: Left ventricular ejection fraction, by estimation, is 55 to 60%. The left ventricle has normal function. The left ventricle has no regional wall motion abnormalities. The left ventricular internal cavity size was normal in size. There is  no left ventricular hypertrophy. The interventricular septum is flattened in systole and diastole, consistent with right ventricular pressure and volume overload. Left ventricular diastolic  parameters are consistent with Grade I diastolic dysfunction (impaired relaxation). Indeterminate filling pressures. Right Ventricle: The right ventricular size is mildly enlarged. Mildly increased right ventricular wall thickness. Right ventricular systolic function is low normal. There is moderately elevated pulmonary artery systolic pressure. The tricuspid regurgitant velocity is 3.79 m/s, and with an assumed right atrial pressure of 3 mmHg, the estimated right ventricular systolic pressure is 80.1 mmHg. Left Atrium: Left atrial size was normal in size. Right Atrium: Right atrial size was mild to moderately dilated. Pericardium: There is no evidence of pericardial effusion. Mitral Valve: The mitral valve is grossly normal. Trivial mitral valve regurgitation. Tricuspid Valve: The tricuspid valve is grossly normal. Tricuspid valve regurgitation is mild to moderate. Aortic Valve: The aortic valve is tricuspid. Aortic valve regurgitation is not visualized. Pulmonic Valve: The pulmonic valve was grossly normal. Pulmonic valve regurgitation is not visualized. Aorta: The aortic root, ascending aorta, aortic arch and descending aorta are all structurally normal, with no evidence of dilitation or obstruction. Venous: The inferior vena cava is normal in size with greater than 50% respiratory variability, suggesting right atrial pressure of 3 mmHg. IAS/Shunts: No atrial level shunt detected by color flow Doppler.  LEFT VENTRICLE PLAX 2D LVIDd:         3.90 cm  Diastology LVIDs:         2.70 cm  LV e' lateral:   5.22 cm/s LV PW:         1.00 cm  LV E/e' lateral: 12.0 LV IVS:        0.90 cm  LV e' medial:    6.20 cm/s LVOT diam:     1.60 cm  LV E/e' medial:  10.1 LV SV:         36 LV SV Index:   23 LVOT Area:     2.01 cm  RIGHT VENTRICLE RV Basal diam:  2.80 cm RV S prime:     12.70 cm/s TAPSE (M-mode): 2.0 cm LEFT ATRIUM             Index       RIGHT ATRIUM           Index LA diam:        2.20 cm 1.40 cm/m  RA Area:      17.10 cm LA Vol (A2C):   21.1 ml 13.43 ml/m RA Volume:   54.90 ml  34.96 ml/m LA Vol (A4C):   30.1 ml 19.17 ml/m LA Biplane Vol: 27.1 ml 17.26 ml/m  AORTIC VALVE LVOT Vmax:   103.00 cm/s LVOT Vmean:  66.300 cm/s LVOT VTI:    0.180 m  AORTA Ao Root diam: 2.70 cm MITRAL VALVE               TRICUSPID VALVE MV Area (PHT): 5.31 cm    TR Peak grad:   57.5 mmHg MV Decel Time: 143 msec    TR Vmax:        379.00 cm/s MV E velocity: 62.60 cm/s MV A velocity: 82.30 cm/s  SHUNTS MV E/A ratio:  0.76        Systemic VTI:  0.18 m                            Systemic Diam: 1.60 cm Lyman Bishop MD Electronically signed by Lyman Bishop MD Signature Date/Time: 11/24/2019/1:25:19 PM    Final     Medications:   . atorvastatin  40 mg Oral q1800  . fluticasone furoate-vilanterol  1 puff Inhalation Daily  . gabapentin  300 mg Oral BID  . pantoprazole  40 mg Oral QAC breakfast  . umeclidinium bromide  1 puff Inhalation Daily   Continuous Infusions: . sodium chloride 100 mL/hr at 11/26/19 0007     LOS: 1 day   Geradine Girt  [facility]   How to contact the [facility] Attending or Consulting provider [place] or covering provider during after hours [place], for this patient?  1. Check the care team in [facility] and look for a) attending/consulting [facility] provider listed and b) the [facility] team listed 2. Log into [email or web address] and use [place]'s universal password to access. If you do not have the password, please contact the hospital operator. 3. Locate the [facility] provider you are looking for under [facility] and page to a number that you can be directly reached. 4. If you still have difficulty reaching the provider, please page the [facility] (Director on Call) for the Hospitalists listed on amion for assistance.  11/26/2019, 10:56 AM

## 2019-11-26 NOTE — Progress Notes (Signed)
Page MD Eliseo Squires concerning patient complaint of LLQ pain.

## 2019-11-27 ENCOUNTER — Inpatient Hospital Stay (HOSPITAL_COMMUNITY): Payer: Medicare Other

## 2019-11-27 ENCOUNTER — Other Ambulatory Visit: Payer: Self-pay | Admitting: *Deleted

## 2019-11-27 DIAGNOSIS — N183 Chronic kidney disease, stage 3 unspecified: Secondary | ICD-10-CM

## 2019-11-27 DIAGNOSIS — K529 Noninfective gastroenteritis and colitis, unspecified: Secondary | ICD-10-CM

## 2019-11-27 DIAGNOSIS — N1832 Chronic kidney disease, stage 3b: Secondary | ICD-10-CM

## 2019-11-27 DIAGNOSIS — I129 Hypertensive chronic kidney disease with stage 1 through stage 4 chronic kidney disease, or unspecified chronic kidney disease: Secondary | ICD-10-CM

## 2019-11-27 DIAGNOSIS — R072 Precordial pain: Secondary | ICD-10-CM

## 2019-11-27 DIAGNOSIS — R1310 Dysphagia, unspecified: Secondary | ICD-10-CM

## 2019-11-27 LAB — BASIC METABOLIC PANEL
Anion gap: 12 (ref 5–15)
BUN: 22 mg/dL (ref 8–23)
CO2: 23 mmol/L (ref 22–32)
Calcium: 8.1 mg/dL — ABNORMAL LOW (ref 8.9–10.3)
Chloride: 102 mmol/L (ref 98–111)
Creatinine, Ser: 1.35 mg/dL — ABNORMAL HIGH (ref 0.61–1.24)
GFR calc Af Amer: >60 mL/min (ref >60)
GFR calc non Af Amer: 55 mL/min — ABNORMAL LOW (ref >60)
Glucose, Bld: 87 mg/dL (ref 70–99)
Potassium: 3.8 mmol/L (ref 3.5–5.1)
Sodium: 137 mmol/L (ref 135–145)

## 2019-11-27 LAB — CBC
HCT: 44.7 % (ref 39.0–52.0)
Hemoglobin: 13.9 g/dL (ref 13.0–17.0)
MCH: 27.2 pg (ref 26.0–34.0)
MCHC: 31.1 g/dL (ref 30.0–36.0)
MCV: 87.5 fL (ref 80.0–100.0)
Platelets: 189 10*3/uL (ref 150–400)
RBC: 5.11 MIL/uL (ref 4.22–5.81)
RDW: 19.2 % — ABNORMAL HIGH (ref 11.5–15.5)
WBC: 17 10*3/uL — ABNORMAL HIGH (ref 4.0–10.5)
nRBC: 0 % (ref 0.0–0.2)

## 2019-11-27 LAB — TROPONIN I (HIGH SENSITIVITY)
Troponin I (High Sensitivity): 18 ng/L — ABNORMAL HIGH (ref <18)
Troponin I (High Sensitivity): 23 ng/L — ABNORMAL HIGH (ref <18)

## 2019-11-27 MED ORDER — METRONIDAZOLE IN NACL 5-0.79 MG/ML-% IV SOLN
500.0000 mg | Freq: Three times a day (TID) | INTRAVENOUS | Status: DC
  Administered 2019-11-27 – 2019-12-02 (×15): 500 mg via INTRAVENOUS
  Filled 2019-11-27 (×15): qty 100

## 2019-11-27 MED ORDER — MORPHINE SULFATE (PF) 2 MG/ML IV SOLN
1.0000 mg | INTRAVENOUS | Status: AC
  Administered 2019-11-27: 1 mg via INTRAVENOUS
  Filled 2019-11-27: qty 1

## 2019-11-27 MED ORDER — MORPHINE SULFATE (PF) 2 MG/ML IV SOLN
2.0000 mg | INTRAVENOUS | Status: DC | PRN
  Administered 2019-11-28 – 2019-12-01 (×13): 2 mg via INTRAVENOUS
  Filled 2019-11-27 (×15): qty 1

## 2019-11-27 MED ORDER — AMLODIPINE BESYLATE 10 MG PO TABS
10.0000 mg | ORAL_TABLET | Freq: Every day | ORAL | Status: DC
  Administered 2019-11-27 – 2019-12-04 (×8): 10 mg via ORAL
  Filled 2019-11-27 (×9): qty 1

## 2019-11-27 MED ORDER — ALUM & MAG HYDROXIDE-SIMETH 200-200-20 MG/5ML PO SUSP
30.0000 mL | Freq: Once | ORAL | Status: AC
  Administered 2019-11-27: 30 mL via ORAL
  Filled 2019-11-27: qty 30

## 2019-11-27 MED ORDER — IOHEXOL 300 MG/ML  SOLN
100.0000 mL | Freq: Once | INTRAMUSCULAR | Status: AC | PRN
  Administered 2019-11-27: 100 mL via INTRAVENOUS

## 2019-11-27 MED ORDER — CIPROFLOXACIN IN D5W 400 MG/200ML IV SOLN
400.0000 mg | Freq: Two times a day (BID) | INTRAVENOUS | Status: DC
  Administered 2019-11-27 – 2019-12-01 (×9): 400 mg via INTRAVENOUS
  Filled 2019-11-27 (×11): qty 200

## 2019-11-27 MED ORDER — SIMETHICONE 80 MG PO CHEW
80.0000 mg | CHEWABLE_TABLET | Freq: Four times a day (QID) | ORAL | Status: DC
  Administered 2019-11-27 – 2019-12-04 (×23): 80 mg via ORAL
  Filled 2019-11-27 (×23): qty 1

## 2019-11-27 MED ORDER — MORPHINE SULFATE (PF) 2 MG/ML IV SOLN
1.0000 mg | INTRAVENOUS | Status: DC | PRN
  Administered 2019-11-27: 1 mg via INTRAVENOUS
  Filled 2019-11-27: qty 1

## 2019-11-27 MED ORDER — IOHEXOL 9 MG/ML PO SOLN
ORAL | Status: AC
  Administered 2019-11-27: 500 mL
  Filled 2019-11-27: qty 1000

## 2019-11-27 NOTE — Progress Notes (Signed)
NGT inserted, pt tolerated well. Chest xray confirmed placement. Hooked to intermittent suction. Pt stated feels much better.  Will monitor.

## 2019-11-27 NOTE — Progress Notes (Addendum)
[facility]  PROGRESS NOTE  Bryan [name] TGG:[id number] DOB: 09-10-1956 DOA: 11/23/2019 PCP: Rosita Fire, MD   Brief HPI:   64 year old male with history of tobacco abuse, hypertension, alcohol abuse quit in 2010, hyperlipidemia was brought to ED with complaints of chest pain shortness of breath.  Initially presented to [facility] but then was transferred to [facility] for cardiology evaluation.  Patient developed multiple episodes of watery stools.  EKG did not show significant changes.  Echocardiogram showed preserved left-ventricular systolic function.  Cardiology planning to do stress test as outpatient.    Subjective   Patient complains of ongoing diarrhea, abdominal distention, difficulty swallowing.  CT abdomen pelvis with contrast obtained today shows thickening of distal esophagus consistent with esophagitis, gastric distention, gastric outlet obstruction.?  Peptic ulcer disease.   Assessment/Plan:     1. Chest pain/dysphagia-patient complains of chest pain/dysphagia, cardiology was consulted.  Plan for outpatient stress test.  CT abdomen pelvis obtained today shows thickening of distal esophagus consistent with esophagitis.  Also shows gastric distention with concern for gastric outlet obstruction.  Discussed with Dr. Paulita Fujita, will insert NG tube with low intermittent suction.  GI to follow.   2. Diarrhea-patient has multiple episodes of diarrhea, C. difficile PCR is negative.  CT abdomen pelvis with contrast confirms enteritis.  Will start IV Cipro and Flagyl.  3. Acute kidney injury on CKD stage IIIa-renal function is slowly improving, creatinine is 1.35.  Continue with IV normal saline at 75 mL/h.  4. Chronic respiratory failure-patient is supposed to wear 2.5 L of oxygen at home.  Looks like he is not compliant with wearing oxygen at home.    SpO2: 95 % O2 Flow Rate (L/min): 2 L/min   COVID-19 Labs  No results for input(s): DDIMER, FERRITIN, LDH, CRP in  the last 72 hours.  Lab Results  Component Value Date   SARSCOV2NAA NEGATIVE 11/23/2019   [place] NEGATIVE 06/13/2019   [place] NEGATIVE 06/09/2019     CBG: Recent Labs  Lab 11/23/19 2214 11/25/19 0447 11/25/19 1707  GLUCAP 161* 155* 102*    CBC: Recent Labs  Lab 11/23/19 1728 11/24/19 0528 11/25/19 0532 11/26/19 0314 11/27/19 0542  WBC 12.7* 13.1* 17.4* 15.7* 17.0*  NEUTROABS 8.7*  --   --   --   --   HGB 14.2 14.8 14.3 14.1 13.9  HCT 46.1 47.9 46.4 45.8 44.7  MCV 87.8 86.9 87.5 87.6 87.5  PLT 179 191 204 191 703    Basic Metabolic Panel: Recent Labs  Lab 11/23/19 1728 11/24/19 0915 11/25/19 0532 11/26/19 0314 11/27/19 0542  NA 140 136 140 137 137  K 4.7 5.5* 4.2 4.1 3.8  CL 101 97* 92* 94* 102  CO2 32 30 33* 31 23  GLUCOSE 102* 129* 123* 97 87  BUN 21 19 35* 35* 22  CREATININE 1.59* 1.50* 2.17* 1.69* 1.35*  CALCIUM 8.9 9.3 9.2 8.5* 8.1*     Liver Function Tests: Recent Labs  Lab 11/23/19 1728  AST 24  ALT 26  ALKPHOS 74  BILITOT 0.5  PROT 7.0  ALBUMIN 3.4*        DVT prophylaxis: Heparin  Code Status: Full code  Family Communication: No family at bedside  Disposition Plan: Patient presented with chest pain, cardiac work-up negative.  Outpatient stress test scheduled.  He continues to have abdominal distention, developed diarrhea also had difficulty swallowing.  CT abdomen pelvis showed esophagitis, gastric outlet obstruction.  Barrier to discharge-GI evaluation, likely will need  endoscopy.     Scheduled medications:  . amLODipine  10 mg Oral Daily  . atorvastatin  40 mg Oral q1800  . fluticasone furoate-vilanterol  1 puff Inhalation Daily  . gabapentin  300 mg Oral BID  . heparin injection (subcutaneous)  5,000 Units Subcutaneous Q8H  . nystatin  5 mL Oral QID  . pantoprazole  40 mg Oral QAC breakfast  . simethicone  80 mg Oral QID  . umeclidinium bromide  1 puff Inhalation Daily     Consultants:  Cardiology  Procedures:    Antibiotics:   Anti-infectives (From admission, onward)   None       Objective   Vitals:   11/27/19 0300 11/27/19 0308 11/27/19 0823 11/27/19 1211  BP:  (!) 158/96  (!) 167/87  Pulse:  (!) 106  (!) 105  Resp:  17  17  Temp:  98.3 F (36.8 C)  98.9 F (37.2 C)  TempSrc:  Oral  Oral  SpO2:  95% 96% 95%  Weight: 55.3 kg     Height:        Intake/Output Summary (Last 24 hours) at 11/27/2019 1649 Last data filed at 11/27/2019 6812 Gross per 24 hour  Intake 240 ml  Output --  Net 240 ml    03/15 1901 - 03/17 0700 In: 2637.5 [P.O.:600; I.V.:2037.5] Out: -   Filed Weights   11/25/19 7517 11/26/19 0337 11/27/19 0300  Weight: 54 kg 55.1 kg 55.3 kg    Physical Examination:   General-appears in no acute distress Heart-S1-S2, regular, no murmur auscultated Lungs-clear to auscultation bilaterally, no wheezing or crackles auscultated Abdomen-soft, mild epigastric tenderness, distended, no organomegaly Extremities-no edema in the lower extremities Neuro-alert, oriented x3, no focal deficit noted   Data Reviewed:   Recent Results (from the past 240 hour(s))  Respiratory Panel by RT PCR (Flu A&B, Covid) - Nasopharyngeal Swab     Status: None   Collection Time: 11/23/19  8:28 PM   Specimen: Nasopharyngeal Swab  Result Value Ref Range Status   SARS Coronavirus 2 by RT PCR NEGATIVE NEGATIVE Final    Comment: (NOTE) SARS-CoV-2 target nucleic acids are NOT DETECTED. The SARS-CoV-2 RNA is generally detectable in upper respiratoy specimens during the acute phase of infection. The lowest concentration of SARS-CoV-2 viral copies this assay can detect is 131 copies/mL. A negative result does not preclude SARS-Cov-2 infection and should not be used as the sole basis for treatment or other patient management decisions. A negative result may occur with  improper specimen collection/handling, submission of specimen other than  nasopharyngeal swab, presence of viral mutation(s) within the areas targeted by this assay, and inadequate number of viral copies (<131 copies/mL). A negative result must be combined with clinical observations, patient history, and epidemiological information. The expected result is Negative. Fact Sheet for Patients:  PinkCheek.be Fact Sheet for Healthcare Providers:  GravelBags.it This test is not yet ap proved or cleared by the [place] FDA and  has been authorized for detection and/or diagnosis of SARS-CoV-2 by FDA under an Emergency Use Authorization (EUA). This EUA will remain  in effect (meaning this test can be used) for the duration of the COVID-19 declaration under Section 564(b)(1) of the Act, 21 U.S.C. section 360bbb-3(b)(1), unless the authorization is terminated or revoked sooner.    Influenza A by PCR NEGATIVE NEGATIVE Final   Influenza B by PCR NEGATIVE NEGATIVE Final    Comment: (NOTE) The Xpert Xpress SARS-CoV-2/FLU/RSV assay is intended as an aid in  the diagnosis of influenza from Nasopharyngeal swab specimens and  should not be used as a sole basis for treatment. Nasal washings and  aspirates are unacceptable for Xpert Xpress SARS-CoV-2/FLU/RSV  testing. Fact Sheet for Patients: PinkCheek.be Fact Sheet for Healthcare Providers: GravelBags.it This test is not yet approved or cleared by the [place] FDA and  has been authorized for detection and/or diagnosis of SARS-CoV-2 by  FDA under an Emergency Use Authorization (EUA). This EUA will remain  in effect (meaning this test can be used) for the duration of the  Covid-19 declaration under Section 564(b)(1) of the Act, 21  U.S.C. section 360bbb-3(b)(1), unless the authorization is  terminated or revoked. Performed at [facility], [address] [place], [place] [address]   GI pathogen panel  by PCR, stool     Status: None   Collection Time: 11/25/19 10:50 AM   Specimen: Stool  Result Value Ref Range Status   Plesiomonas shigelloides NOT DETECTED NOT DETECTED Final   Yersinia enterocolitica NOT DETECTED NOT DETECTED Final   Vibrio NOT DETECTED NOT DETECTED Final   Enteropathogenic E coli NOT DETECTED NOT DETECTED Final   E coli (ETEC) LT/ST NOT DETECTED NOT DETECTED Final   E coli 9201 by PCR Not applicable NOT DETECTED Final   Cryptosporidium by PCR NOT DETECTED NOT DETECTED Final   Entamoeba histolytica NOT DETECTED NOT DETECTED Final   Adenovirus F 40/41 NOT DETECTED NOT DETECTED Final   Norovirus GI/GII NOT DETECTED NOT DETECTED Final   Sapovirus NOT DETECTED NOT DETECTED Final    Comment: (NOTE) Performed At: [facility] [place], [place] [id number] Rush Farmer MD OI:[id number]    Vibrio cholerae NOT DETECTED NOT DETECTED Final   Campylobacter by PCR NOT DETECTED NOT DETECTED Final   Salmonella by PCR NOT DETECTED NOT DETECTED Final   E coli (STEC) NOT DETECTED NOT DETECTED Final   Enteroaggregative E coli NOT DETECTED NOT DETECTED Final   Shigella by PCR NOT DETECTED NOT DETECTED Final   Cyclospora cayetanensis NOT DETECTED NOT DETECTED Final   Astrovirus NOT DETECTED NOT DETECTED Final   G lamblia by PCR NOT DETECTED NOT DETECTED Final   Rotavirus A by PCR NOT DETECTED NOT DETECTED Final  C difficile quick scan w PCR reflex     Status: None   Collection Time: 11/25/19 10:50 AM   Specimen: Stool  Result Value Ref Range Status   C Diff antigen NEGATIVE NEGATIVE Final   C Diff toxin NEGATIVE NEGATIVE Final   C Diff interpretation No C. difficile detected.  Final    Comment: Performed at [facility], [place] [address] [place], [place] [address]    No results for input(s): LIPASE, AMYLASE in the last 168 hours. No results for input(s): AMMONIA in the last 168 hours.  Cardiac Enzymes: No results for input(s): CKTOTAL, CKMB,  CKMBINDEX, TROPONINI in the last 168 hours. BNP (last 3 results) Recent Labs    08/02/19 1526 11/09/19 1823 11/23/19 1728  BNP 83.0 85.0 105.0*    ProBNP (last 3 results) No results for input(s): PROBNP in the last 8760 hours.  Studies:  CT ABDOMEN PELVIS W CONTRAST  Result Date: 11/27/2019 CLINICAL DATA:  Generalized abdominal pain and distention. EXAM: CT ABDOMEN AND PELVIS WITH CONTRAST TECHNIQUE: Multidetector CT imaging of the abdomen and pelvis was performed using the standard protocol following bolus administration of intravenous contrast. CONTRAST:  122m OMNIPAQUE IOHEXOL 300 MG/ML  SOLN COMPARISON:  August 02, 2019. FINDINGS:  Lower chest: Visualized lung bases are unremarkable. There is severe wall thickening of the distal esophagus concerning for esophagitis. Hepatobiliary: No focal liver abnormality is seen. No gallstones, gallbladder wall thickening, or biliary dilatation. Pancreas: Unremarkable. No pancreatic ductal dilatation or surrounding inflammatory changes. Spleen: Normal in size without focal abnormality. Adrenals/Urinary Tract: Adrenal glands are unremarkable. Kidneys are normal, without renal calculi, focal lesion, or hydronephrosis. Bladder is unremarkable. Stomach/Bowel: There is severe gastric distention present with severe wall thickening of the proximal duodenum. This may represent severe peptic ulcer disease resulting in gastric outlet obstruction. Endoscopy is recommended. Fold thickening is seen involving the remaining portion of the duodenum and proximal jejunum concerning for focal enteritis. No colonic dilatation is noted. The appendix is unremarkable. Vascular/Lymphatic: Aortic atherosclerosis. No enlarged abdominal or pelvic lymph nodes. Reproductive: Prostate is unremarkable. Other: No abdominal wall hernia or abnormality. No abdominopelvic ascites. Musculoskeletal: Postsurgical and degenerative changes are noted in the lower lumbar spine. No acute abnormality  is noted. IMPRESSION: 1. Severe wall thickening of the distal esophagus is noted concerning for esophagitis. Endoscopy is recommended for further evaluation. 2. Severe gastric distention is noted with severe wall thickening of the proximal duodenum. This may represent severe peptic ulcer disease resulting in gastric outlet obstruction. Fold thickening is seen involving the remaining portion of the duodenum and proximal jejunum concerning for enteritis. Aortic Atherosclerosis (ICD10-I70.0). Electronically Signed   By: Marijo Conception M.D.   On: 11/27/2019 15:59     Admission status: The appropriate admission status for this patient is INPATIENT. Inpatient status is judged to be reasonable and necessary in order to provide the required intensity of service to ensure the patient's safety. The patient's presenting symptoms, physical exam findings, and initial radiographic and laboratory data in the context of their chronic comorbidities is felt to place them at high risk for further clinical deterioration. Furthermore, it is not anticipated that the patient will be medically stable for discharge from the hospital within 2 midnights of admission. The following factors support the admission status of inpatient.     The patient's presenting symptoms include chest pain The worrisome physical exam findings include abdominal distention. The initial radiographic and laboratory data are worrisome because of CT abdomen/pelvis shows esophagitis, gastric outlet obstruction The chronic co-morbidities include tobacco abuse.       * I certify that at the point of admission it is my clinical judgment that the patient will require inpatient hospital care spanning beyond 2 midnights from the point of admission due to high intensity of service, high risk for further deterioration and high frequency of surveillance required.Oswald Hillock   [facility] If 7PM-7AM, please contact night-coverage at  [email or web address], Office  [phone]   11/27/2019, 4:49 PM  LOS: 2 days

## 2019-11-27 NOTE — Patient Outreach (Signed)
[facility] [facility]) Care Management  11/27/2019  ANTHEM FRAZER Jun 26, 1956 [id number]   [facility] follow up outreach to complex care patient  Mr Minniefield was initially referred to [facility] on 10/07/19 as an [facility] UM referral for assistance with finding an affordable housing.   Referral [place] [facility] Referral Reason:Medium referral (within 10 business day outreach) - [facility], phone [phone]** Unsure if member is still active with [facility], Mentions that he needs assistance finding an affordable place with central air and heat, He lives in a house with a fireplace and is unable to use the oil heat due to his oxygen, Is there a list that member can find or a list that can be mailed to member? Discipline requested [place] LSW Insurance:[employer] medicare   [facility] SW services completed on 11/08/19 and he has been offered resources by 2 [facility] SWs He reports he prefers not to live in an apartment [facility] RN CM encouraged him to consider a lower level apartment related to long waiting lists for homes on the housing list. He states he just recently complied with [facility] SW recommendation to complete housing forms and returning them. [facility] RN CM recommends he outreach to the housing authority office weekly.   [facility] RN CM outreached to Mr Kalas after his recent ED Visit (11/09/19) for COPD exacerbation. Transition of care started. In that outreach concerns noted were follow up with primary care provider (PCP) and pulmonology. Pt report he will make a follow up appointments   Mr Monsanto noted to be admitted on 11/23/19 [facility] RN CM collaborated with [facility] RN CM hospital liaison via Epic in basket for monitoring for pending [facility] RN CM discharge needs  As of 11/27/19 [facility] RN CM notes Mr Mcglasson remains hospitalized at [facility] RN CM will follow up with Mr Schopf pending updated discharge needs per [facility] RN CM hospital liaison  Routed note to MD   [name], RN, BSN, [place]  Coordinator Office number [phone] Mobile number [phone]  Main [facility] number [phone] Fax number [phone]

## 2019-11-27 NOTE — Progress Notes (Signed)
Patient c/o R lower chest pain, pain better when sitting up, 12 lead EKG done, MD notified  Maalox and mylicon given per order will continue to monitor pt.

## 2019-11-28 DIAGNOSIS — K311 Adult hypertrophic pyloric stenosis: Secondary | ICD-10-CM

## 2019-11-28 DIAGNOSIS — K209 Esophagitis, unspecified without bleeding: Secondary | ICD-10-CM

## 2019-11-28 LAB — CBC
HCT: 40.4 % (ref 39.0–52.0)
Hemoglobin: 12.5 g/dL — ABNORMAL LOW (ref 13.0–17.0)
MCH: 27.3 pg (ref 26.0–34.0)
MCHC: 30.9 g/dL (ref 30.0–36.0)
MCV: 88.2 fL (ref 80.0–100.0)
Platelets: 172 10*3/uL (ref 150–400)
RBC: 4.58 MIL/uL (ref 4.22–5.81)
RDW: 18.9 % — ABNORMAL HIGH (ref 11.5–15.5)
WBC: 17.6 10*3/uL — ABNORMAL HIGH (ref 4.0–10.5)
nRBC: 0 % (ref 0.0–0.2)

## 2019-11-28 MED ORDER — PANTOPRAZOLE SODIUM 40 MG IV SOLR
40.0000 mg | Freq: Two times a day (BID) | INTRAVENOUS | Status: DC
  Administered 2019-11-28 – 2019-12-02 (×9): 40 mg via INTRAVENOUS
  Filled 2019-11-28 (×9): qty 40

## 2019-11-28 MED ORDER — ENOXAPARIN SODIUM 40 MG/0.4ML ~~LOC~~ SOLN
40.0000 mg | SUBCUTANEOUS | Status: DC
  Administered 2019-11-29 – 2019-12-04 (×6): 40 mg via SUBCUTANEOUS
  Filled 2019-11-28 (×6): qty 0.4

## 2019-11-28 NOTE — Progress Notes (Signed)
[facility]  PROGRESS NOTE  Bryan [name] STM:[id number] DOB: 16-May-1956 DOA: 11/23/2019 PCP: Rosita Fire, MD   Brief HPI:   64 year old male with history of tobacco abuse, hypertension, alcohol abuse quit in 2010, hyperlipidemia was brought to ED with complaints of chest pain shortness of breath.  Initially presented to [facility] but then was transferred to [facility] for cardiology evaluation.  Patient developed multiple episodes of watery stools.  EKG did not show significant changes.  Echocardiogram showed preserved left-ventricular systolic function.  Cardiology planning to do stress test as outpatient.    Subjective   Patient seen and examined, CT scan abdomen and pelvis done yesterday showed gastric distention, gastric outlet obstruction, enteritis, esophageal thickening.  GI was consulted, NG tube placed with low intermittent suction.  1600 mL output from NG tube.  Patient is feeling much better this morning   Assessment/Plan:     1. Chest pain/dysphagia-patient complains of chest pain/dysphagia, cardiology was consulted.  Plan for outpatient stress test.  CT abdomen pelvis obtained today shows thickening of distal esophagus consistent with esophagitis.  Also shows gastric distention with concern for gastric outlet obstruction.  NG tube was placed yesterday, 6000 mL out from NG tube.  Will obtain abdominal x-ray in a.m.  GI following.    2. Diarrhea-patient had multiple episodes of diarrhea, C. difficile PCR is negative.  CT abdomen pelvis with contrast confirms enteritis.  Started on IV Cipro and Flagyl.  3. Acute kidney injury on CKD stage IIIa-renal function is slowly improving, creatinine is 1.35.  Continue with IV normal saline at 75 mL/h.  4. Chronic respiratory failure-patient is supposed to wear 2.5 L of oxygen at home.  Seems that he is not compliant with wearing oxygen at home.    SpO2: 100 % O2 Flow Rate (L/min): 4 L/min   COVID-19 Labs  No results  for input(s): DDIMER, FERRITIN, LDH, CRP in the last 72 hours.  Lab Results  Component Value Date   SARSCOV2NAA NEGATIVE 11/23/2019   [place] NEGATIVE 06/13/2019   [place] NEGATIVE 06/09/2019     CBG: Recent Labs  Lab 11/23/19 2214 11/25/19 0447 11/25/19 1707  GLUCAP 161* 155* 102*    CBC: Recent Labs  Lab 11/23/19 1728 11/23/19 1728 11/24/19 0528 11/25/19 0532 11/26/19 0314 11/27/19 0542 11/28/19 0511  WBC 12.7*   < > 13.1* 17.4* 15.7* 17.0* 17.6*  NEUTROABS 8.7*  --   --   --   --   --   --   HGB 14.2   < > 14.8 14.3 14.1 13.9 12.5*  HCT 46.1   < > 47.9 46.4 45.8 44.7 40.4  MCV 87.8   < > 86.9 87.5 87.6 87.5 88.2  PLT 179   < > 191 204 191 189 172   < > = values in this interval not displayed.    Basic Metabolic Panel: Recent Labs  Lab 11/23/19 1728 11/24/19 0915 11/25/19 0532 11/26/19 0314 11/27/19 0542  NA 140 136 140 137 137  K 4.7 5.5* 4.2 4.1 3.8  CL 101 97* 92* 94* 102  CO2 32 30 33* 31 23  GLUCOSE 102* 129* 123* 97 87  BUN 21 19 35* 35* 22  CREATININE 1.59* 1.50* 2.17* 1.69* 1.35*  CALCIUM 8.9 9.3 9.2 8.5* 8.1*     Liver Function Tests: Recent Labs  Lab 11/23/19 1728  AST 24  ALT 26  ALKPHOS 74  BILITOT 0.5  PROT 7.0  ALBUMIN 3.4*  DVT prophylaxis: Heparin  Code Status: Full code  Family Communication: No family at bedside  Disposition Plan: Patient presented with chest pain, cardiac work-up negative.  Outpatient stress test scheduled.  He continues to have abdominal distention, developed diarrhea also had difficulty swallowing.  CT abdomen pelvis showed esophagitis, gastric outlet obstruction.  Barrier to discharge-GI evaluation, likely will need endoscopy.     Scheduled medications:  . amLODipine  10 mg Oral Daily  . atorvastatin  40 mg Oral q1800  . enoxaparin (LOVENOX) injection  40 mg Subcutaneous Q24H  . fluticasone furoate-vilanterol  1 puff Inhalation Daily  . gabapentin  300 mg Oral BID  .  nystatin  5 mL Oral QID  . pantoprazole (PROTONIX) IV  40 mg Intravenous Q12H  . simethicone  80 mg Oral QID  . umeclidinium bromide  1 puff Inhalation Daily    Consultants:  Cardiology  Procedures:    Antibiotics:   Anti-infectives (From admission, onward)   Start     Dose/Rate Route Frequency Ordered Stop   11/27/19 1800  ciprofloxacin (CIPRO) IVPB 400 mg     400 mg 200 mL/hr over 60 Minutes Intravenous Every 12 hours 11/27/19 1658     11/27/19 1800  metroNIDAZOLE (FLAGYL) IVPB 500 mg     500 mg 100 mL/hr over 60 Minutes Intravenous Every 8 hours 11/27/19 1658         Objective   Vitals:   11/28/19 0521 11/28/19 0812 11/28/19 1127 11/28/19 1221  BP: (!) 147/77 140/72 (!) 130/57   Pulse: (!) 102 100 99   Resp: 18 (!) 22 (!) 28 (!) 23  Temp: 100.1 F (37.8 C) 99.5 F (37.5 C) 99.6 F (37.6 C)   TempSrc: Oral Oral Oral   SpO2: 97% 96% 100%   Weight:      Height:        Intake/Output Summary (Last 24 hours) at 11/28/2019 1435 Last data filed at 11/28/2019 1100 Gross per 24 hour  Intake 1600 ml  Output 2425 ml  Net -825 ml    03/16 1901 - 03/18 0700 In: 1840 [P.O.:240] Out: 1900   Filed Weights   11/26/19 0337 11/27/19 0300 11/28/19 0127  Weight: 55.1 kg 55.3 kg 54.3 kg    Physical Examination:   General-appears in no acute distress Heart-S1-S2, regular, no murmur auscultated Lungs-clear to auscultation bilaterally, no wheezing or crackles auscultated Abdomen-soft, nontender, no organomegaly Extremities-no edema in the lower extremities Neuro-alert, oriented x3, no focal deficit noted   Data Reviewed:   Recent Results (from the past 240 hour(s))  Respiratory Panel by RT PCR (Flu A&B, Covid) - Nasopharyngeal Swab     Status: None   Collection Time: 11/23/19  8:28 PM   Specimen: Nasopharyngeal Swab  Result Value Ref Range Status   SARS Coronavirus 2 by RT PCR NEGATIVE NEGATIVE Final    Comment: (NOTE) SARS-CoV-2 target nucleic acids are NOT  DETECTED. The SARS-CoV-2 RNA is generally detectable in upper respiratoy specimens during the acute phase of infection. The lowest concentration of SARS-CoV-2 viral copies this assay can detect is 131 copies/mL. A negative result does not preclude SARS-Cov-2 infection and should not be used as the sole basis for treatment or other patient management decisions. A negative result may occur with  improper specimen collection/handling, submission of specimen other than nasopharyngeal swab, presence of viral mutation(s) within the areas targeted by this assay, and inadequate number of viral copies (<131 copies/mL). A negative result must be combined with clinical observations,  patient history, and epidemiological information. The expected result is Negative. Fact Sheet for Patients:  PinkCheek.be Fact Sheet for Healthcare Providers:  GravelBags.it This test is not yet ap proved or cleared by the [place] FDA and  has been authorized for detection and/or diagnosis of SARS-CoV-2 by FDA under an Emergency Use Authorization (EUA). This EUA will remain  in effect (meaning this test can be used) for the duration of the COVID-19 declaration under Section 564(b)(1) of the Act, 21 U.S.C. section 360bbb-3(b)(1), unless the authorization is terminated or revoked sooner.    Influenza A by PCR NEGATIVE NEGATIVE Final   Influenza B by PCR NEGATIVE NEGATIVE Final    Comment: (NOTE) The Xpert Xpress SARS-CoV-2/FLU/RSV assay is intended as an aid in  the diagnosis of influenza from Nasopharyngeal swab specimens and  should not be used as a sole basis for treatment. Nasal washings and  aspirates are unacceptable for Xpert Xpress SARS-CoV-2/FLU/RSV  testing. Fact Sheet for Patients: PinkCheek.be Fact Sheet for Healthcare Providers: GravelBags.it This test is not yet approved or cleared  by the [place] FDA and  has been authorized for detection and/or diagnosis of SARS-CoV-2 by  FDA under an Emergency Use Authorization (EUA). This EUA will remain  in effect (meaning this test can be used) for the duration of the  Covid-19 declaration under Section 564(b)(1) of the Act, 21  U.S.C. section 360bbb-3(b)(1), unless the authorization is  terminated or revoked. Performed at [facility], [address], [place], [place] [address]   GI pathogen panel by PCR, stool     Status: None   Collection Time: 11/25/19 10:50 AM   Specimen: Stool  Result Value Ref Range Status   Plesiomonas shigelloides NOT DETECTED NOT DETECTED Final   Yersinia enterocolitica NOT DETECTED NOT DETECTED Final   Vibrio NOT DETECTED NOT DETECTED Final   Enteropathogenic E coli NOT DETECTED NOT DETECTED Final   E coli (ETEC) LT/ST NOT DETECTED NOT DETECTED Final   E coli 6314 by PCR Not applicable NOT DETECTED Final   Cryptosporidium by PCR NOT DETECTED NOT DETECTED Final   Entamoeba histolytica NOT DETECTED NOT DETECTED Final   Adenovirus F 40/41 NOT DETECTED NOT DETECTED Final   Norovirus GI/GII NOT DETECTED NOT DETECTED Final   Sapovirus NOT DETECTED NOT DETECTED Final    Comment: (NOTE) Performed At: [facility] [place], [place] [id number] Rush Farmer MD YI:[id number]    Vibrio cholerae NOT DETECTED NOT DETECTED Final   Campylobacter by PCR NOT DETECTED NOT DETECTED Final   Salmonella by PCR NOT DETECTED NOT DETECTED Final   E coli (STEC) NOT DETECTED NOT DETECTED Final   Enteroaggregative E coli NOT DETECTED NOT DETECTED Final   Shigella by PCR NOT DETECTED NOT DETECTED Final   Cyclospora cayetanensis NOT DETECTED NOT DETECTED Final   Astrovirus NOT DETECTED NOT DETECTED Final   G lamblia by PCR NOT DETECTED NOT DETECTED Final   Rotavirus A by PCR NOT DETECTED NOT DETECTED Final  C difficile quick scan w PCR reflex     Status: None   Collection Time: 11/25/19  10:50 AM   Specimen: Stool  Result Value Ref Range Status   C Diff antigen NEGATIVE NEGATIVE Final   C Diff toxin NEGATIVE NEGATIVE Final   C Diff interpretation No C. difficile detected.  Final    Comment: Performed at [facility], [place] [address] [place], [place] [address]    No results for input(s): LIPASE, AMYLASE in  the last 168 hours. No results for input(s): AMMONIA in the last 168 hours.  Cardiac Enzymes: No results for input(s): CKTOTAL, CKMB, CKMBINDEX, TROPONINI in the last 168 hours. BNP (last 3 results) Recent Labs    08/02/19 1526 11/09/19 1823 11/23/19 1728  BNP 83.0 85.0 105.0*    ProBNP (last 3 results) No results for input(s): PROBNP in the last 8760 hours.  Studies:  CT ABDOMEN PELVIS W CONTRAST  Result Date: 11/27/2019 CLINICAL DATA:  Generalized abdominal pain and distention. EXAM: CT ABDOMEN AND PELVIS WITH CONTRAST TECHNIQUE: Multidetector CT imaging of the abdomen and pelvis was performed using the standard protocol following bolus administration of intravenous contrast. CONTRAST:  167m OMNIPAQUE IOHEXOL 300 MG/ML  SOLN COMPARISON:  August 02, 2019. FINDINGS: Lower chest: Visualized lung bases are unremarkable. There is severe wall thickening of the distal esophagus concerning for esophagitis. Hepatobiliary: No focal liver abnormality is seen. No gallstones, gallbladder wall thickening, or biliary dilatation. Pancreas: Unremarkable. No pancreatic ductal dilatation or surrounding inflammatory changes. Spleen: Normal in size without focal abnormality. Adrenals/Urinary Tract: Adrenal glands are unremarkable. Kidneys are normal, without renal calculi, focal lesion, or hydronephrosis. Bladder is unremarkable. Stomach/Bowel: There is severe gastric distention present with severe wall thickening of the proximal duodenum. This may represent severe peptic ulcer disease resulting in gastric outlet obstruction. Endoscopy is recommended. Fold thickening is seen  involving the remaining portion of the duodenum and proximal jejunum concerning for focal enteritis. No colonic dilatation is noted. The appendix is unremarkable. Vascular/Lymphatic: Aortic atherosclerosis. No enlarged abdominal or pelvic lymph nodes. Reproductive: Prostate is unremarkable. Other: No abdominal wall hernia or abnormality. No abdominopelvic ascites. Musculoskeletal: Postsurgical and degenerative changes are noted in the lower lumbar spine. No acute abnormality is noted. IMPRESSION: 1. Severe wall thickening of the distal esophagus is noted concerning for esophagitis. Endoscopy is recommended for further evaluation. 2. Severe gastric distention is noted with severe wall thickening of the proximal duodenum. This may represent severe peptic ulcer disease resulting in gastric outlet obstruction. Fold thickening is seen involving the remaining portion of the duodenum and proximal jejunum concerning for enteritis. Aortic Atherosclerosis (ICD10-I70.0). Electronically Signed   By: JMarijo ConceptionM.D.   On: 11/27/2019 15:59   DG CHEST PORT 1 VIEW  Result Date: 11/27/2019 CLINICAL DATA:  64year old male with myocardial infarction. EXAM: PORTABLE CHEST 1 VIEW COMPARISON:  Chest radiograph and CT dated 11/23/2019. FINDINGS: An enteric tube courses below the diaphragm with tip beyond the inferior margin the image. There is no focal consolidation, pleural effusion, or pneumothorax. The cardiac silhouette is within normal limits. Dilated central pulmonary arteries suggestive of a degree of pulmonary hypertension. Clinical correlation is recommended. No acute osseous pathology. Cervical spine fusion hardware. IMPRESSION: 1. No acute cardiopulmonary process. 2. Findings suggestive of pulmonary hypertension. Electronically Signed   By: AAnner CreteM.D.   On: 11/27/2019 20:19     Admission status: The appropriate admission status for this patient is INPATIENT. Inpatient status is judged to be reasonable  and necessary in order to provide the required intensity of service to ensure the patient's safety. The patient's presenting symptoms, physical exam findings, and initial radiographic and laboratory data in the context of their chronic comorbidities is felt to place them at high risk for further clinical deterioration. Furthermore, it is not anticipated that the patient will be medically stable for discharge from the hospital within 2 midnights of admission. The following factors support the admission status of inpatient.     The  patient's presenting symptoms include chest pain The worrisome physical exam findings include abdominal distention. The initial radiographic and laboratory data are worrisome because of CT abdomen/pelvis shows esophagitis, gastric outlet obstruction The chronic co-morbidities include tobacco abuse.       * I certify that at the point of admission it is my clinical judgment that the patient will require inpatient hospital care spanning beyond 2 midnights from the point of admission due to high intensity of service, high risk for further deterioration and high frequency of surveillance required.Oswald Hillock   [facility] If 7PM-7AM, please contact night-coverage at [email or web address], Office  [phone]   11/28/2019, 2:35 PM  LOS: 3 days

## 2019-11-28 NOTE — H&P (View-Only) (Signed)
[facility] Consultation Note  Referring Provider:  [facility] Primary Care Physician:  Rosita Fire, MD Primary Gastroenterologist:  Dr. Gala Romney [facility])  Reason for Consultation:  Abnormal imaging  HPI: Bryan [name] is a 64 y.o. male presenting with chest pain and shortness of breath.  Cardiopulmonary work-up largely unrevealing.  Patient has history of chronic oxygen-dependent COPD.  Patient has reported some abdominal distention and nausea during this hospitalization.  No prior similar symptoms.  No frank blood in stool and no hematemesis.  Has had some increased stool output.  NGT was placed yesterday.  His distention is better.  Has chronic GERD, improved since admission.  Endoscopy and colonoscopy 2018 with Dr. Gala Romney.  He had CT scan recently showing esophageal thickening and gastric distention, for which nasogastric tube was placed yesterday.  1600 mL out from NGT since placement.   Past Medical History:  Diagnosis Date  . Acid reflux   . Arthritis   . Asthma   . Chronic pain    with leg and back pain (disc problem)  . COPD (chronic obstructive pulmonary disease) ([place])   . Diabetes mellitus without complication ([place])   . Headache    HX OF  . History of upper GI x-ray series    to follow showed large duodenal ulcer H pylori serologies were negative  . Hypertension   . Pneumonia    07/17/17  . Pre-diabetes   . Stroke [facility])    TIA MINI STROKE  . Tubular adenoma     Past Surgical History:  Procedure Laterality Date  . BACK SURGERY  7/05; 5/09    Dr.Hirsch,3 lumbar  X3  . BACK SURGERY    . COLONOSCOPY  Feb 2012   Dr. Gala Romney: normal rectum, pedunculate polyp removed but not recovered  . COLONOSCOPY WITH ESOPHAGOGASTRODUODENOSCOPY (EGD) N/A 11/18/2013   Dr.Rourk- tcs= normal rectum, multipe polyps about the ileocecal valve and distal transverse colon o/w the remainder of the colonic mucosa appeared normal bx= tubular adenoma. EGD= normal esophagus, stomach  with scattered erosions mottling, friablility, no ulcer or infiltrating process patent pylorus bx= chronic inflammation. next TCS 11/2016  . COLONOSCOPY WITH PROPOFOL N/A 05/25/2017   Dr. Gala Romney: sigmoid diverticulosis, one 4 mm hyerplastic rectal polyp, ascending colonic AVMs surveillance 2023  . ESOPHAGOGASTRODUODENOSCOPY  1/06   Dr. Volney American esophageal erosions,U-shaped stomach,marked erosions and edema of the bulb without discrete ulcer disease.   . ESOPHAGOGASTRODUODENOSCOPY (EGD) WITH PROPOFOL N/A 05/25/2017   Dr. Gala Romney: reflux esophagitis s/p empiric dilation, normal stomach and duodenum  . MALONEY DILATION N/A 05/25/2017   Procedure: Venia Minks DILATION;  Surgeon: Daneil Dolin, MD;  Location: [facility];  Service: Endoscopy;  Laterality: N/A;  . NECK SURGERY  10/2007   PLATE IN NECK  . POLYPECTOMY  05/25/2017   Procedure: POLYPECTOMY;  Surgeon: Daneil Dolin, MD;  Location: [facility];  Service: Endoscopy;;  rectal  . TRANSURETHRAL RESECTION OF PROSTATE N/A 08/17/2017   Procedure: TRANSURETHRAL RESECTION OF THE PROSTATE (TURP);  Surgeon: Irine Seal, MD;  Location: [facility];  Service: Urology;  Laterality: N/A;    Prior to Admission medications   Medication Sig Start Date End Date Taking? Authorizing Provider  acetaminophen (TYLENOL) 325 MG tablet Take 2 tablets (650 mg total) by mouth every 6 (six) hours as needed for mild pain (or Fever >/= 101). 06/14/19  Yes Emokpae, Courage, MD  albuterol (PROVENTIL) (2.5 MG/3ML) 0.083% nebulizer solution Take 3 mLs (2.5 mg total) by nebulization every 4 (four) hours  as needed for wheezing or shortness of breath. 06/14/19  Yes Emokpae, Courage, MD  albuterol (VENTOLIN HFA) 108 (90 Base) MCG/ACT inhaler Inhale 2 puffs into the lungs every 4 (four) hours as needed for wheezing or shortness of breath. Do not use with nebulizer 06/14/19  Yes Emokpae, Courage, MD  Fluticasone-Umeclidin-Vilant (TRELEGY ELLIPTA) 100-62.5-25 MCG/INH AEPB Inhale 1 puff into  the lungs daily. 06/14/19  Yes Emokpae, Courage, MD  gabapentin (NEURONTIN) 300 MG capsule Take 1 capsule (300 mg total) by mouth 2 (two) times daily. Pt. Says he is taking twice daily. Patient taking differently: Take 300 mg by mouth 2 (two) times daily.  06/14/19  Yes Emokpae, Courage, MD  guaiFENesin (MUCINEX) 600 MG 12 hr tablet Take 1 tablet (600 mg total) by mouth 2 (two) times daily. Patient taking differently: Take 600 mg by mouth daily.  06/14/19 06/13/20 Yes Roxan Hockey, MD  linaclotide (LINZESS) 72 MCG capsule Take 1 capsule (72 mcg total) by mouth daily before breakfast. 06/14/19  Yes Emokpae, Courage, MD  OXYGEN Inhale 2.5 L into the lungs daily.   Yes [provider]  polyethylene glycol powder (MIRALAX) 17 GM/SCOOP powder Take 17 g by mouth daily as needed (FOR CONSTIPATION).  11/14/18  Yes [provider]  aspirin EC 81 MG tablet Take 81 mg by mouth daily.    [provider]  pantoprazole (PROTONIX) 40 MG tablet Take 1 tablet (40 mg total) by mouth daily before breakfast. Patient not taking: Reported on 11/23/2019 02/15/19   [name], NP  predniSONE (STERAPRED UNI-PAK 21 TAB) 10 MG (21) TBPK tablet Take by mouth daily. Take 6 tabs by mouth daily  for 2 days, then 5 tabs for 2 days, then 4 tabs for 2 days, then 3 tabs for 2 days, 2 tabs for 2 days, then 1 tab by mouth daily for 2 days Patient not taking: Reported on 11/23/2019 11/09/19   Albrizze, Harley Hallmark, PA-C    Current Facility-Administered Medications  Medication Dose Route Frequency Provider Last Rate Last Admin  . 0.9 %  sodium chloride infusion   Intravenous Continuous Oswald Hillock, MD 75 mL/hr at 11/28/19 0835 New Bag at 11/28/19 0835  . acetaminophen (TYLENOL) tablet 650 mg  650 mg Oral Q4H PRN Bunnie Pion Z, DO   650 mg at 11/26/19 1820  . amLODipine (NORVASC) tablet 10 mg  10 mg Oral Daily Oswald Hillock, MD   10 mg at 11/27/19 1124  . atorvastatin (LIPITOR) tablet 40 mg  40 mg Oral q1800  Tolia, Sunit, DO   40 mg at 11/27/19 1819  . calcium carbonate (TUMS - dosed in mg elemental calcium) chewable tablet 400 mg of elemental calcium  2 tablet Oral TID PRN Gardiner Barefoot, NP   400 mg of elemental calcium at 11/27/19 0624  . ciprofloxacin (CIPRO) IVPB 400 mg  400 mg Intravenous Q12H Oswald Hillock, MD 200 mL/hr at 11/28/19 0531 400 mg at 11/28/19 0531  . enoxaparin (LOVENOX) injection 40 mg  40 mg Subcutaneous Q24H Bertis Ruddy, RPH      . fluticasone furoate-vilanterol (BREO ELLIPTA) 100-25 MCG/INH 1 puff  1 puff Inhalation Daily Eulogio Bear U, DO   1 puff at 11/28/19 0802  . gabapentin (NEURONTIN) capsule 300 mg  300 mg Oral BID Eulogio Bear U, DO   300 mg at 11/27/19 9983  . hydrALAZINE (APRESOLINE) injection 10 mg  10 mg Intravenous Q8H PRN Bunnie Pion Z, DO      .  ipratropium-albuterol (DUONEB) 0.5-2.5 (3) MG/3ML nebulizer solution 3 mL  3 mL Nebulization Q4H PRN Bunnie Pion Z, DO      . loperamide (IMODIUM) capsule 2 mg  2 mg Oral PRN Eulogio Bear U, DO   2 mg at 11/27/19 0101  . metroNIDAZOLE (FLAGYL) IVPB 500 mg  500 mg Intravenous Q8H Oswald Hillock, MD 100 mL/hr at 11/28/19 0206 500 mg at 11/28/19 0206  . morphine 2 MG/ML injection 2 mg  2 mg Intravenous Q3H PRN Gardiner Barefoot, NP   2 mg at 11/28/19 0530  . nitroGLYCERIN (NITROSTAT) SL tablet 0.4 mg  0.4 mg Sublingual Q5 min PRN Bunnie Pion Z, DO      . nystatin (MYCOSTATIN) 100000 UNIT/ML suspension 500,000 Units  5 mL Oral QID Eulogio Bear U, DO   500,000 Units at 11/27/19 1818  . ondansetron (ZOFRAN) injection 4 mg  4 mg Intravenous Q6H PRN Bunnie Pion Z, DO   4 mg at 11/27/19 0101  . pantoprazole (PROTONIX) EC tablet 40 mg  40 mg Oral QAC breakfast Eulogio Bear U, DO   40 mg at 11/27/19 7494  . simethicone (MYLICON) chewable tablet 80 mg  80 mg Oral QID Oswald Hillock, MD   80 mg at 11/27/19 1818  . umeclidinium bromide (INCRUSE ELLIPTA) 62.5 MCG/INH 1 puff  1 puff Inhalation Daily Eulogio Bear U, DO   1 puff at 11/28/19 0802    Allergies as of 11/23/2019  . (No Known Allergies)    Family History  Problem Relation Age of Onset  . Cancer Father   . Asthma Mother   . Colon cancer Neg Hx     Social History   Socioeconomic History  . Marital status: Single    Spouse name: Not on file  . Number of children: 2  . Years of education: 49  . Highest education level: High school graduate  Occupational History  . Occupation: disabled    Fish farm manager: UNEMPLOYED  Tobacco Use  . Smoking status: Former Smoker    Packs/day: 0.50    Years: 40.00    Pack years: 20.00    Types: Cigarettes    Quit date: 11/11/2014    Years since quitting: 5.0  . Smokeless tobacco: Never Used  Substance and Sexual Activity  . Alcohol use: No    Alcohol/week: 0.0 standard drinks  . Drug use: Yes    Types: Marijuana    Comment: occas  . Sexual activity: Not Currently    Birth control/protection: None  Other Topics Concern  . Not on file  Social History Narrative   Lives with girlfriend and son, is on disability for history of back injuries.   Social Determinants of Health   Financial Resource Strain: Medium Risk  . Difficulty of Paying Living Expenses: Somewhat hard  Food Insecurity: No Food Insecurity  . Worried About Charity fundraiser in the Last Year: Never true  . Ran Out of Food in the Last Year: Never true  Transportation Needs: No Transportation Needs  . Lack of Transportation (Medical): No  . Lack of Transportation (Non-Medical): No  Physical Activity: Inactive  . Days of Exercise per Week: 0 days  . Minutes of Exercise per Session: 0 min  Stress: No Stress Concern Present  . Feeling of Stress : Not at all  Social Connections: Moderately Isolated  . Frequency of Communication with Friends and Family: More than three times a week  . Frequency of Social Gatherings with Friends and  Family: More than three times a week  . Attends Religious Services: Never  . Active Member  of Clubs or Organizations: No  . Attends Archivist Meetings: Never  . Marital Status: Never married  Intimate Partner Violence: Not At Risk  . Fear of Current or Ex-Partner: No  . Emotionally Abused: No  . Physically Abused: No  . Sexually Abused: No    Review of Systems: As per HPI, all others negative  Physical Exam: Vital signs in last 24 hours: Temp:  [98.9 F (37.2 C)-100.1 F (37.8 C)] 99.5 F (37.5 C) (03/18 0812) Pulse Rate:  [96-110] 100 (03/18 0812) Resp:  [17-22] 22 (03/18 0812) BP: (140-167)/(72-87) 140/72 (03/18 0812) SpO2:  [95 %-100 %] 96 % (03/18 0812) Weight:  [54.3 kg] 54.3 kg (03/18 0127) Last BM Date: 11/28/19 General:   Alert, thin, cooperative in NAD Head:  Normocephalic and atraumatic. Eyes:  Sclera clear, no icterus.   Conjunctiva pink. Ears:  Normal auditory acuity. Nose:  NGT in place; no deformity, discharge,  or lesions. Mouth:  No deformity or lesions.  Oropharynx pink & moist. Neck:  Supple; no masses or thyromegaly. Lungs:  Clear throughout to auscultation.   No wheezes, crackles, or rhonchi. No acute distress. Heart:  Regular rate and rhythm; no murmurs, clicks, rubs,  or gallops. Abdomen:  Soft, less distended per patient.; non-tender No masses, hepatosplenomegaly or hernias noted. Normal bowel sounds, without guarding, and without rebound.     Msk:  Symmetrical without gross deformities. Normal posture. Pulses:  Normal pulses noted. Extremities:  Without clubbing or edema. Neurologic:  Alert and  oriented x4;  grossly normal neurologically. Skin:  Intact without significant lesions or rashes. Psych:  Alert and cooperative. Normal mood and affect.   Lab Results: Recent Labs    11/26/19 0314 11/27/19 0542 11/28/19 0511  WBC 15.7* 17.0* 17.6*  HGB 14.1 13.9 12.5*  HCT 45.8 44.7 40.4  PLT 191 189 172   BMET Recent Labs    11/26/19 0314 11/27/19 0542  NA 137 137  K 4.1 3.8  CL 94* 102  CO2 31 23  GLUCOSE 97 87  BUN  35* 22  CREATININE 1.69* 1.35*  CALCIUM 8.5* 8.1*   LFT No results for input(s): PROT, ALBUMIN, AST, ALT, ALKPHOS, BILITOT, BILIDIR, IBILI in the last 72 hours. PT/INR No results for input(s): LABPROT, INR in the last 72 hours.  Studies/Results: CT ABDOMEN PELVIS W CONTRAST  Result Date: 11/27/2019 CLINICAL DATA:  Generalized abdominal pain and distention. EXAM: CT ABDOMEN AND PELVIS WITH CONTRAST TECHNIQUE: Multidetector CT imaging of the abdomen and pelvis was performed using the standard protocol following bolus administration of intravenous contrast. CONTRAST:  149m OMNIPAQUE IOHEXOL 300 MG/ML  SOLN COMPARISON:  August 02, 2019. FINDINGS: Lower chest: Visualized lung bases are unremarkable. There is severe wall thickening of the distal esophagus concerning for esophagitis. Hepatobiliary: No focal liver abnormality is seen. No gallstones, gallbladder wall thickening, or biliary dilatation. Pancreas: Unremarkable. No pancreatic ductal dilatation or surrounding inflammatory changes. Spleen: Normal in size without focal abnormality. Adrenals/Urinary Tract: Adrenal glands are unremarkable. Kidneys are normal, without renal calculi, focal lesion, or hydronephrosis. Bladder is unremarkable. Stomach/Bowel: There is severe gastric distention present with severe wall thickening of the proximal duodenum. This may represent severe peptic ulcer disease resulting in gastric outlet obstruction. Endoscopy is recommended. Fold thickening is seen involving the remaining portion of the duodenum and proximal jejunum concerning for focal enteritis. No colonic dilatation is noted. The appendix  is unremarkable. Vascular/Lymphatic: Aortic atherosclerosis. No enlarged abdominal or pelvic lymph nodes. Reproductive: Prostate is unremarkable. Other: No abdominal wall hernia or abnormality. No abdominopelvic ascites. Musculoskeletal: Postsurgical and degenerative changes are noted in the lower lumbar spine. No acute  abnormality is noted. IMPRESSION: 1. Severe wall thickening of the distal esophagus is noted concerning for esophagitis. Endoscopy is recommended for further evaluation. 2. Severe gastric distention is noted with severe wall thickening of the proximal duodenum. This may represent severe peptic ulcer disease resulting in gastric outlet obstruction. Fold thickening is seen involving the remaining portion of the duodenum and proximal jejunum concerning for enteritis. Aortic Atherosclerosis (ICD10-I70.0). Electronically Signed   By: Marijo Conception M.D.   On: 11/27/2019 15:59   DG CHEST PORT 1 VIEW  Result Date: 11/27/2019 CLINICAL DATA:  64 year old male with myocardial infarction. EXAM: PORTABLE CHEST 1 VIEW COMPARISON:  Chest radiograph and CT dated 11/23/2019. FINDINGS: An enteric tube courses below the diaphragm with tip beyond the inferior margin the image. There is no focal consolidation, pleural effusion, or pneumothorax. The cardiac silhouette is within normal limits. Dilated central pulmonary arteries suggestive of a degree of pulmonary hypertension. Clinical correlation is recommended. No acute osseous pathology. Cervical spine fusion hardware. IMPRESSION: 1. No acute cardiopulmonary process. 2. Findings suggestive of pulmonary hypertension. Electronically Signed   By: Anner Crete M.D.   On: 11/27/2019 20:19   Impression:  1.  Nausea + vomiting. 2.  GERD. 3.  Abnormal imaging (esophageal thickening, gastric distention). 4.  Possible gastric outlet obstruction, improving after nasogastric tube placement.  Plan:  1.  Continue Nasogastric tuber decompression. 2.  NPO for now. 3.  Change to IV PPI. 4.  Abdominal xray tomorrow to reassess degree of gastric distention. 5.  Suspected endoscopy some time in the next few days during this admission. 6.  [facility] will follow.   LOS: 3 days   Korene Dula M  11/28/2019, 10:37 AM  Cell [phone] If no answer or after 5 PM call  [phone]

## 2019-11-28 NOTE — Consult Note (Signed)
[facility] Consultation Note  Referring Provider:  [facility] Primary Care Physician:  Rosita Fire, MD Primary Gastroenterologist:  Dr. Gala Romney [facility])  Reason for Consultation:  Abnormal imaging  HPI: Bryan [name] is a 64 y.o. male presenting with chest pain and shortness of breath.  Cardiopulmonary work-up largely unrevealing.  Patient has history of chronic oxygen-dependent COPD.  Patient has reported some abdominal distention and nausea during this hospitalization.  No prior similar symptoms.  No frank blood in stool and no hematemesis.  Has had some increased stool output.  NGT was placed yesterday.  His distention is better.  Has chronic GERD, improved since admission.  Endoscopy and colonoscopy 2018 with Dr. Gala Romney.  He had CT scan recently showing esophageal thickening and gastric distention, for which nasogastric tube was placed yesterday.  1600 mL out from NGT since placement.   Past Medical History:  Diagnosis Date  . Acid reflux   . Arthritis   . Asthma   . Chronic pain    with leg and back pain (disc problem)  . COPD (chronic obstructive pulmonary disease) ([place])   . Diabetes mellitus without complication ([place])   . Headache    HX OF  . History of upper GI x-ray series    to follow showed large duodenal ulcer H pylori serologies were negative  . Hypertension   . Pneumonia    07/17/17  . Pre-diabetes   . Stroke [facility])    TIA MINI STROKE  . Tubular adenoma     Past Surgical History:  Procedure Laterality Date  . BACK SURGERY  7/05; 5/09    Dr.Hirsch,3 lumbar  X3  . BACK SURGERY    . COLONOSCOPY  Feb 2012   Dr. Gala Romney: normal rectum, pedunculate polyp removed but not recovered  . COLONOSCOPY WITH ESOPHAGOGASTRODUODENOSCOPY (EGD) N/A 11/18/2013   Dr.Rourk- tcs= normal rectum, multipe polyps about the ileocecal valve and distal transverse colon o/w the remainder of the colonic mucosa appeared normal bx= tubular adenoma. EGD= normal esophagus, stomach  with scattered erosions mottling, friablility, no ulcer or infiltrating process patent pylorus bx= chronic inflammation. next TCS 11/2016  . COLONOSCOPY WITH PROPOFOL N/A 05/25/2017   Dr. Gala Romney: sigmoid diverticulosis, one 4 mm hyerplastic rectal polyp, ascending colonic AVMs surveillance 2023  . ESOPHAGOGASTRODUODENOSCOPY  1/06   Dr. Volney American esophageal erosions,U-shaped stomach,marked erosions and edema of the bulb without discrete ulcer disease.   . ESOPHAGOGASTRODUODENOSCOPY (EGD) WITH PROPOFOL N/A 05/25/2017   Dr. Gala Romney: reflux esophagitis s/p empiric dilation, normal stomach and duodenum  . MALONEY DILATION N/A 05/25/2017   Procedure: Venia Minks DILATION;  Surgeon: Daneil Dolin, MD;  Location: [facility];  Service: Endoscopy;  Laterality: N/A;  . NECK SURGERY  10/2007   PLATE IN NECK  . POLYPECTOMY  05/25/2017   Procedure: POLYPECTOMY;  Surgeon: Daneil Dolin, MD;  Location: [facility];  Service: Endoscopy;;  rectal  . TRANSURETHRAL RESECTION OF PROSTATE N/A 08/17/2017   Procedure: TRANSURETHRAL RESECTION OF THE PROSTATE (TURP);  Surgeon: Irine Seal, MD;  Location: [facility];  Service: Urology;  Laterality: N/A;    Prior to Admission medications   Medication Sig Start Date End Date Taking? Authorizing Provider  acetaminophen (TYLENOL) 325 MG tablet Take 2 tablets (650 mg total) by mouth every 6 (six) hours as needed for mild pain (or Fever >/= 101). 06/14/19  Yes Emokpae, Courage, MD  albuterol (PROVENTIL) (2.5 MG/3ML) 0.083% nebulizer solution Take 3 mLs (2.5 mg total) by nebulization every 4 (four) hours  as needed for wheezing or shortness of breath. 06/14/19  Yes Emokpae, Courage, MD  albuterol (VENTOLIN HFA) 108 (90 Base) MCG/ACT inhaler Inhale 2 puffs into the lungs every 4 (four) hours as needed for wheezing or shortness of breath. Do not use with nebulizer 06/14/19  Yes Emokpae, Courage, MD  Fluticasone-Umeclidin-Vilant (TRELEGY ELLIPTA) 100-62.5-25 MCG/INH AEPB Inhale 1 puff into  the lungs daily. 06/14/19  Yes Emokpae, Courage, MD  gabapentin (NEURONTIN) 300 MG capsule Take 1 capsule (300 mg total) by mouth 2 (two) times daily. Pt. Says he is taking twice daily. Patient taking differently: Take 300 mg by mouth 2 (two) times daily.  06/14/19  Yes Emokpae, Courage, MD  guaiFENesin (MUCINEX) 600 MG 12 hr tablet Take 1 tablet (600 mg total) by mouth 2 (two) times daily. Patient taking differently: Take 600 mg by mouth daily.  06/14/19 06/13/20 Yes Roxan Hockey, MD  linaclotide (LINZESS) 72 MCG capsule Take 1 capsule (72 mcg total) by mouth daily before breakfast. 06/14/19  Yes Emokpae, Courage, MD  OXYGEN Inhale 2.5 L into the lungs daily.   Yes [provider]  polyethylene glycol powder (MIRALAX) 17 GM/SCOOP powder Take 17 g by mouth daily as needed (FOR CONSTIPATION).  11/14/18  Yes [provider]  aspirin EC 81 MG tablet Take 81 mg by mouth daily.    [provider]  pantoprazole (PROTONIX) 40 MG tablet Take 1 tablet (40 mg total) by mouth daily before breakfast. Patient not taking: Reported on 11/23/2019 02/15/19   [name], NP  predniSONE (STERAPRED UNI-PAK 21 TAB) 10 MG (21) TBPK tablet Take by mouth daily. Take 6 tabs by mouth daily  for 2 days, then 5 tabs for 2 days, then 4 tabs for 2 days, then 3 tabs for 2 days, 2 tabs for 2 days, then 1 tab by mouth daily for 2 days Patient not taking: Reported on 11/23/2019 11/09/19   Albrizze, Harley Hallmark, PA-C    Current Facility-Administered Medications  Medication Dose Route Frequency Provider Last Rate Last Admin  . 0.9 %  sodium chloride infusion   Intravenous Continuous Oswald Hillock, MD 75 mL/hr at 11/28/19 0835 New Bag at 11/28/19 0835  . acetaminophen (TYLENOL) tablet 650 mg  650 mg Oral Q4H PRN Bunnie Pion Z, DO   650 mg at 11/26/19 1820  . amLODipine (NORVASC) tablet 10 mg  10 mg Oral Daily Oswald Hillock, MD   10 mg at 11/27/19 1124  . atorvastatin (LIPITOR) tablet 40 mg  40 mg Oral q1800  Tolia, Sunit, DO   40 mg at 11/27/19 1819  . calcium carbonate (TUMS - dosed in mg elemental calcium) chewable tablet 400 mg of elemental calcium  2 tablet Oral TID PRN Gardiner Barefoot, NP   400 mg of elemental calcium at 11/27/19 0624  . ciprofloxacin (CIPRO) IVPB 400 mg  400 mg Intravenous Q12H Oswald Hillock, MD 200 mL/hr at 11/28/19 0531 400 mg at 11/28/19 0531  . enoxaparin (LOVENOX) injection 40 mg  40 mg Subcutaneous Q24H Bertis Ruddy, RPH      . fluticasone furoate-vilanterol (BREO ELLIPTA) 100-25 MCG/INH 1 puff  1 puff Inhalation Daily Eulogio Bear U, DO   1 puff at 11/28/19 0802  . gabapentin (NEURONTIN) capsule 300 mg  300 mg Oral BID Eulogio Bear U, DO   300 mg at 11/27/19 9983  . hydrALAZINE (APRESOLINE) injection 10 mg  10 mg Intravenous Q8H PRN Bunnie Pion Z, DO      .  ipratropium-albuterol (DUONEB) 0.5-2.5 (3) MG/3ML nebulizer solution 3 mL  3 mL Nebulization Q4H PRN Bunnie Pion Z, DO      . loperamide (IMODIUM) capsule 2 mg  2 mg Oral PRN Eulogio Bear U, DO   2 mg at 11/27/19 0101  . metroNIDAZOLE (FLAGYL) IVPB 500 mg  500 mg Intravenous Q8H Oswald Hillock, MD 100 mL/hr at 11/28/19 0206 500 mg at 11/28/19 0206  . morphine 2 MG/ML injection 2 mg  2 mg Intravenous Q3H PRN Gardiner Barefoot, NP   2 mg at 11/28/19 0530  . nitroGLYCERIN (NITROSTAT) SL tablet 0.4 mg  0.4 mg Sublingual Q5 min PRN Bunnie Pion Z, DO      . nystatin (MYCOSTATIN) 100000 UNIT/ML suspension 500,000 Units  5 mL Oral QID Eulogio Bear U, DO   500,000 Units at 11/27/19 1818  . ondansetron (ZOFRAN) injection 4 mg  4 mg Intravenous Q6H PRN Bunnie Pion Z, DO   4 mg at 11/27/19 0101  . pantoprazole (PROTONIX) EC tablet 40 mg  40 mg Oral QAC breakfast Eulogio Bear U, DO   40 mg at 11/27/19 7494  . simethicone (MYLICON) chewable tablet 80 mg  80 mg Oral QID Oswald Hillock, MD   80 mg at 11/27/19 1818  . umeclidinium bromide (INCRUSE ELLIPTA) 62.5 MCG/INH 1 puff  1 puff Inhalation Daily Eulogio Bear U, DO   1 puff at 11/28/19 0802    Allergies as of 11/23/2019  . (No Known Allergies)    Family History  Problem Relation Age of Onset  . Cancer Father   . Asthma Mother   . Colon cancer Neg Hx     Social History   Socioeconomic History  . Marital status: Single    Spouse name: Not on file  . Number of children: 2  . Years of education: 49  . Highest education level: High school graduate  Occupational History  . Occupation: disabled    Fish farm manager: UNEMPLOYED  Tobacco Use  . Smoking status: Former Smoker    Packs/day: 0.50    Years: 40.00    Pack years: 20.00    Types: Cigarettes    Quit date: 11/11/2014    Years since quitting: 5.0  . Smokeless tobacco: Never Used  Substance and Sexual Activity  . Alcohol use: No    Alcohol/week: 0.0 standard drinks  . Drug use: Yes    Types: Marijuana    Comment: occas  . Sexual activity: Not Currently    Birth control/protection: None  Other Topics Concern  . Not on file  Social History Narrative   Lives with girlfriend and son, is on disability for history of back injuries.   Social Determinants of Health   Financial Resource Strain: Medium Risk  . Difficulty of Paying Living Expenses: Somewhat hard  Food Insecurity: No Food Insecurity  . Worried About Charity fundraiser in the Last Year: Never true  . Ran Out of Food in the Last Year: Never true  Transportation Needs: No Transportation Needs  . Lack of Transportation (Medical): No  . Lack of Transportation (Non-Medical): No  Physical Activity: Inactive  . Days of Exercise per Week: 0 days  . Minutes of Exercise per Session: 0 min  Stress: No Stress Concern Present  . Feeling of Stress : Not at all  Social Connections: Moderately Isolated  . Frequency of Communication with Friends and Family: More than three times a week  . Frequency of Social Gatherings with Friends and  Family: More than three times a week  . Attends Religious Services: Never  . Active Member  of Clubs or Organizations: No  . Attends Archivist Meetings: Never  . Marital Status: Never married  Intimate Partner Violence: Not At Risk  . Fear of Current or Ex-Partner: No  . Emotionally Abused: No  . Physically Abused: No  . Sexually Abused: No    Review of Systems: As per HPI, all others negative  Physical Exam: Vital signs in last 24 hours: Temp:  [98.9 F (37.2 C)-100.1 F (37.8 C)] 99.5 F (37.5 C) (03/18 0812) Pulse Rate:  [96-110] 100 (03/18 0812) Resp:  [17-22] 22 (03/18 0812) BP: (140-167)/(72-87) 140/72 (03/18 0812) SpO2:  [95 %-100 %] 96 % (03/18 0812) Weight:  [54.3 kg] 54.3 kg (03/18 0127) Last BM Date: 11/28/19 General:   Alert, thin, cooperative in NAD Head:  Normocephalic and atraumatic. Eyes:  Sclera clear, no icterus.   Conjunctiva pink. Ears:  Normal auditory acuity. Nose:  NGT in place; no deformity, discharge,  or lesions. Mouth:  No deformity or lesions.  Oropharynx pink & moist. Neck:  Supple; no masses or thyromegaly. Lungs:  Clear throughout to auscultation.   No wheezes, crackles, or rhonchi. No acute distress. Heart:  Regular rate and rhythm; no murmurs, clicks, rubs,  or gallops. Abdomen:  Soft, less distended per patient.; non-tender No masses, hepatosplenomegaly or hernias noted. Normal bowel sounds, without guarding, and without rebound.     Msk:  Symmetrical without gross deformities. Normal posture. Pulses:  Normal pulses noted. Extremities:  Without clubbing or edema. Neurologic:  Alert and  oriented x4;  grossly normal neurologically. Skin:  Intact without significant lesions or rashes. Psych:  Alert and cooperative. Normal mood and affect.   Lab Results: Recent Labs    11/26/19 0314 11/27/19 0542 11/28/19 0511  WBC 15.7* 17.0* 17.6*  HGB 14.1 13.9 12.5*  HCT 45.8 44.7 40.4  PLT 191 189 172   BMET Recent Labs    11/26/19 0314 11/27/19 0542  NA 137 137  K 4.1 3.8  CL 94* 102  CO2 31 23  GLUCOSE 97 87  BUN  35* 22  CREATININE 1.69* 1.35*  CALCIUM 8.5* 8.1*   LFT No results for input(s): PROT, ALBUMIN, AST, ALT, ALKPHOS, BILITOT, BILIDIR, IBILI in the last 72 hours. PT/INR No results for input(s): LABPROT, INR in the last 72 hours.  Studies/Results: CT ABDOMEN PELVIS W CONTRAST  Result Date: 11/27/2019 CLINICAL DATA:  Generalized abdominal pain and distention. EXAM: CT ABDOMEN AND PELVIS WITH CONTRAST TECHNIQUE: Multidetector CT imaging of the abdomen and pelvis was performed using the standard protocol following bolus administration of intravenous contrast. CONTRAST:  149m OMNIPAQUE IOHEXOL 300 MG/ML  SOLN COMPARISON:  August 02, 2019. FINDINGS: Lower chest: Visualized lung bases are unremarkable. There is severe wall thickening of the distal esophagus concerning for esophagitis. Hepatobiliary: No focal liver abnormality is seen. No gallstones, gallbladder wall thickening, or biliary dilatation. Pancreas: Unremarkable. No pancreatic ductal dilatation or surrounding inflammatory changes. Spleen: Normal in size without focal abnormality. Adrenals/Urinary Tract: Adrenal glands are unremarkable. Kidneys are normal, without renal calculi, focal lesion, or hydronephrosis. Bladder is unremarkable. Stomach/Bowel: There is severe gastric distention present with severe wall thickening of the proximal duodenum. This may represent severe peptic ulcer disease resulting in gastric outlet obstruction. Endoscopy is recommended. Fold thickening is seen involving the remaining portion of the duodenum and proximal jejunum concerning for focal enteritis. No colonic dilatation is noted. The appendix  is unremarkable. Vascular/Lymphatic: Aortic atherosclerosis. No enlarged abdominal or pelvic lymph nodes. Reproductive: Prostate is unremarkable. Other: No abdominal wall hernia or abnormality. No abdominopelvic ascites. Musculoskeletal: Postsurgical and degenerative changes are noted in the lower lumbar spine. No acute  abnormality is noted. IMPRESSION: 1. Severe wall thickening of the distal esophagus is noted concerning for esophagitis. Endoscopy is recommended for further evaluation. 2. Severe gastric distention is noted with severe wall thickening of the proximal duodenum. This may represent severe peptic ulcer disease resulting in gastric outlet obstruction. Fold thickening is seen involving the remaining portion of the duodenum and proximal jejunum concerning for enteritis. Aortic Atherosclerosis (ICD10-I70.0). Electronically Signed   By: Marijo Conception M.D.   On: 11/27/2019 15:59   DG CHEST PORT 1 VIEW  Result Date: 11/27/2019 CLINICAL DATA:  64 year old male with myocardial infarction. EXAM: PORTABLE CHEST 1 VIEW COMPARISON:  Chest radiograph and CT dated 11/23/2019. FINDINGS: An enteric tube courses below the diaphragm with tip beyond the inferior margin the image. There is no focal consolidation, pleural effusion, or pneumothorax. The cardiac silhouette is within normal limits. Dilated central pulmonary arteries suggestive of a degree of pulmonary hypertension. Clinical correlation is recommended. No acute osseous pathology. Cervical spine fusion hardware. IMPRESSION: 1. No acute cardiopulmonary process. 2. Findings suggestive of pulmonary hypertension. Electronically Signed   By: Anner Crete M.D.   On: 11/27/2019 20:19   Impression:  1.  Nausea + vomiting. 2.  GERD. 3.  Abnormal imaging (esophageal thickening, gastric distention). 4.  Possible gastric outlet obstruction, improving after nasogastric tube placement.  Plan:  1.  Continue Nasogastric tuber decompression. 2.  NPO for now. 3.  Change to IV PPI. 4.  Abdominal xray tomorrow to reassess degree of gastric distention. 5.  Suspected endoscopy some time in the next few days during this admission. 6.  [facility] will follow.   LOS: 3 days   Shakeya Kerkman M  11/28/2019, 10:37 AM  Cell [phone] If no answer or after 5 PM call  [phone]

## 2019-11-29 ENCOUNTER — Inpatient Hospital Stay (HOSPITAL_COMMUNITY): Payer: Medicare Other

## 2019-11-29 LAB — COMPREHENSIVE METABOLIC PANEL
ALT: 15 U/L (ref 0–44)
AST: 18 U/L (ref 15–41)
Albumin: 2.3 g/dL — ABNORMAL LOW (ref 3.5–5.0)
Alkaline Phosphatase: 58 U/L (ref 38–126)
Anion gap: 11 (ref 5–15)
BUN: 11 mg/dL (ref 8–23)
CO2: 26 mmol/L (ref 22–32)
Calcium: 8.4 mg/dL — ABNORMAL LOW (ref 8.9–10.3)
Chloride: 103 mmol/L (ref 98–111)
Creatinine, Ser: 1.43 mg/dL — ABNORMAL HIGH (ref 0.61–1.24)
GFR calc Af Amer: 60 mL/min — ABNORMAL LOW (ref >60)
GFR calc non Af Amer: 52 mL/min — ABNORMAL LOW (ref >60)
Glucose, Bld: 88 mg/dL (ref 70–99)
Potassium: 3.8 mmol/L (ref 3.5–5.1)
Sodium: 140 mmol/L (ref 135–145)
Total Bilirubin: 0.8 mg/dL (ref 0.3–1.2)
Total Protein: 5.4 g/dL — ABNORMAL LOW (ref 6.5–8.1)

## 2019-11-29 LAB — CBC
HCT: 39.4 % (ref 39.0–52.0)
Hemoglobin: 12.1 g/dL — ABNORMAL LOW (ref 13.0–17.0)
MCH: 27.2 pg (ref 26.0–34.0)
MCHC: 30.7 g/dL (ref 30.0–36.0)
MCV: 88.5 fL (ref 80.0–100.0)
Platelets: 187 10*3/uL (ref 150–400)
RBC: 4.45 MIL/uL (ref 4.22–5.81)
RDW: 19.3 % — ABNORMAL HIGH (ref 11.5–15.5)
WBC: 15.4 10*3/uL — ABNORMAL HIGH (ref 4.0–10.5)
nRBC: 0 % (ref 0.0–0.2)

## 2019-11-29 NOTE — Care Management Important Message (Signed)
Important Message  Patient Details  Name: ESPEN BETHEL MRN: [id number] Date of Birth: 1956/05/01   Medicare Important Message Given:  Yes     Shelda Altes 11/29/2019, 1:31 PM

## 2019-11-29 NOTE — Progress Notes (Addendum)
[facility]  PROGRESS NOTE  Bryan [name] XHB:[id number] DOB: 23-Jan-1956 DOA: 11/23/2019 PCP: Rosita Fire, MD   Brief HPI:   64 year old male with history of tobacco abuse, hypertension, alcohol abuse quit in 2010, hyperlipidemia was brought to ED with complaints of chest pain shortness of breath.  Initially presented to [facility] but then was transferred to [facility] for cardiology evaluation.  Patient developed multiple episodes of watery stools.  EKG did not show significant changes.  Echocardiogram showed preserved left-ventricular systolic function.  Cardiology planning to do stress test as outpatient. CT scan abdomen and pelvis done yesterday showed gastric distention, gastric outlet obstruction, enteritis, esophageal thickening.  GI was consulted, NG tube placed with low intermittent suction.  1600 mL output from NG tube.      Subjective   Patient seen and examined, abdomen x-ray obtained this morning shows improvement of gastric distention.  NG tube has been clamped per GI.  Plan for EGD in a.m.   Assessment/Plan:     1. Chest pain/dysphagia-patient complains of chest pain/dysphagia, cardiology was consulted.  Plan for outpatient stress test.  CT abdomen pelvis obtained today shows thickening of distal esophagus consistent with esophagitis.  Also shows gastric distention with concern for gastric outlet obstruction.  NG tube was placed yesterday, 1600  mL out from NG tube.  Abdominal x-ray obtained this morning shows improvement of gastric distention.  Nonspecific bowel gas pattern.  NG tube has been clamped.  GI has scheduled for EGD in a.m. continue IV Protonix 40 mg every 12 hours.  2. Diarrhea-patient had multiple episodes of diarrhea, C. difficile PCR is negative.  Improved.  CT abdomen pelvis with contrast confirmed enteritis.  Started on IV Cipro and Flagyl.  3. Acute kidney injury on CKD stage IIIa-renal function is slowly improving, creatinine is 1. 43 . we  will increase IV normal saline to 100 mL/h/  4. Chronic respiratory failure-patient is supposed to wear 2.5 L of oxygen at home.  Seems that he is not compliant with wearing oxygen at home.    SpO2: 95 % O2 Flow Rate (L/min): 4 L/min   COVID-19 Labs  No results for input(s): DDIMER, FERRITIN, LDH, CRP in the last 72 hours.  Lab Results  Component Value Date   SARSCOV2NAA NEGATIVE 11/23/2019   [place] NEGATIVE 06/13/2019   [place] NEGATIVE 06/09/2019     CBG: Recent Labs  Lab 11/23/19 2214 11/25/19 0447 11/25/19 1707  GLUCAP 161* 155* 102*    CBC: Recent Labs  Lab 11/23/19 1728 11/24/19 0528 11/25/19 0532 11/26/19 0314 11/27/19 0542 11/28/19 0511 11/29/19 0357  WBC 12.7*   < > 17.4* 15.7* 17.0* 17.6* 15.4*  NEUTROABS 8.7*  --   --   --   --   --   --   HGB 14.2   < > 14.3 14.1 13.9 12.5* 12.1*  HCT 46.1   < > 46.4 45.8 44.7 40.4 39.4  MCV 87.8   < > 87.5 87.6 87.5 88.2 88.5  PLT 179   < > 204 191 189 172 187   < > = values in this interval not displayed.    Basic Metabolic Panel: Recent Labs  Lab 11/24/19 0915 11/25/19 0532 11/26/19 0314 11/27/19 0542 11/29/19 0357  NA 136 140 137 137 140  K 5.5* 4.2 4.1 3.8 3.8  CL 97* 92* 94* 102 103  CO2 30 33* _0 GLUCOSE 129* 123* 97 87 88  BUN 19 35* 35* 22 11  CREATININE 1.50* 2.17* 1.69* 1.35* 1.43*  CALCIUM 9.3 9.2 8.5* 8.1* 8.4*     Liver Function Tests: Recent Labs  Lab 11/23/19 1728 11/29/19 0357  AST 24 18  ALT 26 15  ALKPHOS 74 58  BILITOT 0.5 0.8  PROT 7.0 5.4*  ALBUMIN 3.4* 2.3*        DVT prophylaxis: Heparin  Code Status: Full code  Family Communication: No family at bedside  Disposition Plan: Patient presented with chest pain, cardiac work-up negative.  Outpatient stress test scheduled.  He continues to have abdominal distention, developed diarrhea also had difficulty swallowing.  CT abdomen pelvis showed esophagitis, gastric outlet obstruction.  Barrier to  discharge-GI evaluation, likely will need endoscopy.     Scheduled medications:  . amLODipine  10 mg Oral Daily  . atorvastatin  40 mg Oral q1800  . enoxaparin (LOVENOX) injection  40 mg Subcutaneous Q24H  . fluticasone furoate-vilanterol  1 puff Inhalation Daily  . gabapentin  300 mg Oral BID  . nystatin  5 mL Oral QID  . pantoprazole (PROTONIX) IV  40 mg Intravenous Q12H  . simethicone  80 mg Oral QID  . umeclidinium bromide  1 puff Inhalation Daily    Consultants:  Cardiology   Antibiotics:   Anti-infectives (From admission, onward)   Start     Dose/Rate Route Frequency Ordered Stop   11/27/19 1800  ciprofloxacin (CIPRO) IVPB 400 mg     400 mg 200 mL/hr over 60 Minutes Intravenous Every 12 hours 11/27/19 1658     11/27/19 1800  metroNIDAZOLE (FLAGYL) IVPB 500 mg     500 mg 100 mL/hr over 60 Minutes Intravenous Every 8 hours 11/27/19 1658         Objective   Vitals:   11/29/19 0545 11/29/19 0815 11/29/19 0845 11/29/19 1159  BP:   (!) 144/77 133/65  Pulse:  (!) 104 100 98  Resp:  _0 Temp:   99.8 F (37.7 C) 99 F (37.2 C)  TempSrc:   Oral Oral  SpO2:  93% 98% 95%  Weight: 54.3 kg     Height:        Intake/Output Summary (Last 24 hours) at 11/29/2019 1323 Last data filed at 11/29/2019 1213 Gross per 24 hour  Intake 2669.07 ml  Output 1700 ml  Net 969.07 ml    03/17 1901 - 03/19 0700 In: 4569.1 [I.V.:1569.1] Out: 3825 [WRUEA:5409]  Filed Weights   11/27/19 0300 11/28/19 0127 11/29/19 0545  Weight: 55.3 kg 54.3 kg 54.3 kg    Physical Examination:  General-appears in no acute distress Heart-S1-S2, regular, no murmur auscultated Lungs-clear to auscultation bilaterally, no wheezing or crackles auscultated Abdomen-soft, nontender, no organomegaly Extremities-no edema in the lower extremities Neuro-alert, oriented x3, no focal deficit noted   Data Reviewed:   Recent Results (from the past 240 hour(s))  Respiratory Panel by RT PCR (Flu  A&B, Covid) - Nasopharyngeal Swab     Status: None   Collection Time: 11/23/19  8:28 PM   Specimen: Nasopharyngeal Swab  Result Value Ref Range Status   SARS Coronavirus 2 by RT PCR NEGATIVE NEGATIVE Final    Comment: (NOTE) SARS-CoV-2 target nucleic acids are NOT DETECTED. The SARS-CoV-2 RNA is generally detectable in upper respiratoy specimens during the acute phase of infection. The lowest concentration of SARS-CoV-2 viral copies this assay can detect is 131 copies/mL. A negative result does not preclude SARS-Cov-2 infection and should not be used as the sole basis for treatment or  other patient management decisions. A negative result may occur with  improper specimen collection/handling, submission of specimen other than nasopharyngeal swab, presence of viral mutation(s) within the areas targeted by this assay, and inadequate number of viral copies (<131 copies/mL). A negative result must be combined with clinical observations, patient history, and epidemiological information. The expected result is Negative. Fact Sheet for Patients:  PinkCheek.be Fact Sheet for Healthcare Providers:  GravelBags.it This test is not yet ap proved or cleared by the [place] FDA and  has been authorized for detection and/or diagnosis of SARS-CoV-2 by FDA under an Emergency Use Authorization (EUA). This EUA will remain  in effect (meaning this test can be used) for the duration of the COVID-19 declaration under Section 564(b)(1) of the Act, 21 U.S.C. section 360bbb-3(b)(1), unless the authorization is terminated or revoked sooner.    Influenza A by PCR NEGATIVE NEGATIVE Final   Influenza B by PCR NEGATIVE NEGATIVE Final    Comment: (NOTE) The Xpert Xpress SARS-CoV-2/FLU/RSV assay is intended as an aid in  the diagnosis of influenza from Nasopharyngeal swab specimens and  should not be used as a sole basis for treatment. Nasal washings  and  aspirates are unacceptable for Xpert Xpress SARS-CoV-2/FLU/RSV  testing. Fact Sheet for Patients: PinkCheek.be Fact Sheet for Healthcare Providers: GravelBags.it This test is not yet approved or cleared by the [place] FDA and  has been authorized for detection and/or diagnosis of SARS-CoV-2 by  FDA under an Emergency Use Authorization (EUA). This EUA will remain  in effect (meaning this test can be used) for the duration of the  Covid-19 declaration under Section 564(b)(1) of the Act, 21  U.S.C. section 360bbb-3(b)(1), unless the authorization is  terminated or revoked. Performed at [facility], [address] [place], [place] [address]   GI pathogen panel by PCR, stool     Status: None   Collection Time: 11/25/19 10:50 AM   Specimen: Stool  Result Value Ref Range Status   Plesiomonas shigelloides NOT DETECTED NOT DETECTED Final   Yersinia enterocolitica NOT DETECTED NOT DETECTED Final   Vibrio NOT DETECTED NOT DETECTED Final   Enteropathogenic E coli NOT DETECTED NOT DETECTED Final   E coli (ETEC) LT/ST NOT DETECTED NOT DETECTED Final   E coli 7673 by PCR Not applicable NOT DETECTED Final   Cryptosporidium by PCR NOT DETECTED NOT DETECTED Final   Entamoeba histolytica NOT DETECTED NOT DETECTED Final   Adenovirus F 40/41 NOT DETECTED NOT DETECTED Final   Norovirus GI/GII NOT DETECTED NOT DETECTED Final   Sapovirus NOT DETECTED NOT DETECTED Final    Comment: (NOTE) Performed At: [facility] [place], [place] [id number] Rush Farmer MD OX:[id number]    Vibrio cholerae NOT DETECTED NOT DETECTED Final   Campylobacter by PCR NOT DETECTED NOT DETECTED Final   Salmonella by PCR NOT DETECTED NOT DETECTED Final   E coli (STEC) NOT DETECTED NOT DETECTED Final   Enteroaggregative E coli NOT DETECTED NOT DETECTED Final   Shigella by PCR NOT DETECTED NOT DETECTED Final   Cyclospora cayetanensis  NOT DETECTED NOT DETECTED Final   Astrovirus NOT DETECTED NOT DETECTED Final   G lamblia by PCR NOT DETECTED NOT DETECTED Final   Rotavirus A by PCR NOT DETECTED NOT DETECTED Final  C difficile quick scan w PCR reflex     Status: None   Collection Time: 11/25/19 10:50 AM   Specimen: Stool  Result Value Ref Range Status   C Diff antigen  NEGATIVE NEGATIVE Final   C Diff toxin NEGATIVE NEGATIVE Final   C Diff interpretation No C. difficile detected.  Final    Comment: Performed at [facility], [place] [address], [place],  [address]    No results for input(s): LIPASE, AMYLASE in the last 168 hours. No results for input(s): AMMONIA in the last 168 hours.  Cardiac Enzymes: No results for input(s): CKTOTAL, CKMB, CKMBINDEX, TROPONINI in the last 168 hours. BNP (last 3 results) Recent Labs    08/02/19 1526 11/09/19 1823 11/23/19 1728  BNP 83.0 85.0 105.0*    ProBNP (last 3 results) No results for input(s): PROBNP in the last 8760 hours.  Studies:  CT ABDOMEN PELVIS W CONTRAST  Result Date: 11/27/2019 CLINICAL DATA:  Generalized abdominal pain and distention. EXAM: CT ABDOMEN AND PELVIS WITH CONTRAST TECHNIQUE: Multidetector CT imaging of the abdomen and pelvis was performed using the standard protocol following bolus administration of intravenous contrast. CONTRAST:  113m OMNIPAQUE IOHEXOL 300 MG/ML  SOLN COMPARISON:  August 02, 2019. FINDINGS: Lower chest: Visualized lung bases are unremarkable. There is severe wall thickening of the distal esophagus concerning for esophagitis. Hepatobiliary: No focal liver abnormality is seen. No gallstones, gallbladder wall thickening, or biliary dilatation. Pancreas: Unremarkable. No pancreatic ductal dilatation or surrounding inflammatory changes. Spleen: Normal in size without focal abnormality. Adrenals/Urinary Tract: Adrenal glands are unremarkable. Kidneys are normal, without renal calculi, focal lesion, or hydronephrosis. Bladder is  unremarkable. Stomach/Bowel: There is severe gastric distention present with severe wall thickening of the proximal duodenum. This may represent severe peptic ulcer disease resulting in gastric outlet obstruction. Endoscopy is recommended. Fold thickening is seen involving the remaining portion of the duodenum and proximal jejunum concerning for focal enteritis. No colonic dilatation is noted. The appendix is unremarkable. Vascular/Lymphatic: Aortic atherosclerosis. No enlarged abdominal or pelvic lymph nodes. Reproductive: Prostate is unremarkable. Other: No abdominal wall hernia or abnormality. No abdominopelvic ascites. Musculoskeletal: Postsurgical and degenerative changes are noted in the lower lumbar spine. No acute abnormality is noted. IMPRESSION: 1. Severe wall thickening of the distal esophagus is noted concerning for esophagitis. Endoscopy is recommended for further evaluation. 2. Severe gastric distention is noted with severe wall thickening of the proximal duodenum. This may represent severe peptic ulcer disease resulting in gastric outlet obstruction. Fold thickening is seen involving the remaining portion of the duodenum and proximal jejunum concerning for enteritis. Aortic Atherosclerosis (ICD10-I70.0). Electronically Signed   By: JMarijo ConceptionM.D.   On: 11/27/2019 15:59   DG CHEST PORT 1 VIEW  Result Date: 11/27/2019 CLINICAL DATA:  64year old male with myocardial infarction. EXAM: PORTABLE CHEST 1 VIEW COMPARISON:  Chest radiograph and CT dated 11/23/2019. FINDINGS: An enteric tube courses below the diaphragm with tip beyond the inferior margin the image. There is no focal consolidation, pleural effusion, or pneumothorax. The cardiac silhouette is within normal limits. Dilated central pulmonary arteries suggestive of a degree of pulmonary hypertension. Clinical correlation is recommended. No acute osseous pathology. Cervical spine fusion hardware. IMPRESSION: 1. No acute cardiopulmonary  process. 2. Findings suggestive of pulmonary hypertension. Electronically Signed   By: AAnner CreteM.D.   On: 11/27/2019 20:19   DG Abd 2 Views  Result Date: 11/29/2019 CLINICAL DATA:  Gastric outlet obstruction. EXAM: ABDOMEN - 2 VIEW COMPARISON:  CT scan 11/27/2019 FINDINGS: Upright film shows no evidence for intraperitoneal free air. Visualized lungs appear hyperexpanded. Interstitial markings are diffusely coarsened with chronic features. NG tube tip is in the region of the  pylorus and transpyloric placement cannot be excluded. There is diffuse mild distention of colon with contrast material visible in the colonic lumen. Lumbar spine fixation hardware evident. IMPRESSION: 1. NG tube tip position in the region of the pylorus and transpyloric placement cannot be excluded. 2. Nonspecific bowel gas pattern. Electronically Signed   By: Misty Stanley M.D.   On: 11/29/2019 09:06    Admission status: The appropriate admission status for this patient is INPATIENT. Inpatient status is judged to be reasonable and necessary in order to provide the required intensity of service to ensure the patient's safety. The patient's presenting symptoms, physical exam findings, and initial radiographic and laboratory data in the context of their chronic comorbidities is felt to place them at high risk for further clinical deterioration. Furthermore, it is not anticipated that the patient will be medically stable for discharge from the hospital within 2 midnights of admission. The following factors support the admission status of inpatient.     The patient's presenting symptoms include chest pain The worrisome physical exam findings include abdominal distention. The initial radiographic and laboratory data are worrisome because of CT abdomen/pelvis shows esophagitis, gastric outlet obstruction The chronic co-morbidities include tobacco abuse.       * I certify that at the point of admission it is my clinical  judgment that the patient will require inpatient hospital care spanning beyond 2 midnights from the point of admission due to high intensity of service, high risk for further deterioration and high frequency of surveillance required.Oswald Hillock   [facility] If 7PM-7AM, please contact night-coverage at [email or web address], Office  [phone]   11/29/2019, 1:23 PM  LOS: 4 days

## 2019-11-29 NOTE — Plan of Care (Signed)
  Problem: Elimination: Goal: Will not experience complications related to urinary retention Outcome: Completed/Met   Problem: Safety: Goal: Ability to remain free from injury will improve Outcome: Completed/Met   Problem: Skin Integrity: Goal: Risk for impaired skin integrity will decrease Outcome: Completed/Met

## 2019-11-29 NOTE — Progress Notes (Signed)
Patient feels better.  Less distended.  Xray shows apparent resolution of degree of gastric distention.  Suggest:  1.  Clamp NGT. 2.  Continue IV PPI. 3.  Clamp NGT, NPO after midnight. 4.  Endoscopy planned for tomorrow.

## 2019-11-29 NOTE — Progress Notes (Addendum)
Pt found sitting on edge of bed.  Pt reports tube uncomfortable.  Staff found NG tube on the floor.  Will inform provider.  Provider informed.  Provider said leave NG tube out for now.

## 2019-11-30 ENCOUNTER — Inpatient Hospital Stay (HOSPITAL_COMMUNITY): Payer: Medicare Other | Admitting: Certified Registered Nurse Anesthetist

## 2019-11-30 ENCOUNTER — Encounter (HOSPITAL_COMMUNITY)
Admission: EM | Disposition: A | Discharge status: Home / Self Care | Payer: Self-pay | Source: Home / Self Care | Attending: Family Medicine

## 2019-11-30 ENCOUNTER — Encounter (HOSPITAL_COMMUNITY): Payer: Self-pay | Admitting: Internal Medicine

## 2019-11-30 HISTORY — PX: ESOPHAGOGASTRODUODENOSCOPY (EGD) WITH PROPOFOL: SHX5813

## 2019-11-30 LAB — CBC
HCT: 37.4 % — ABNORMAL LOW (ref 39.0–52.0)
HCT: 36.2 % — ABNORMAL LOW (ref 39.0–52.0)
Hemoglobin: 11.5 g/dL — ABNORMAL LOW (ref 13.0–17.0)
Hemoglobin: 11 g/dL — ABNORMAL LOW (ref 13.0–17.0)
MCH: 27.3 pg (ref 26.0–34.0)
MCH: 27 pg (ref 26.0–34.0)
MCHC: 30.7 g/dL (ref 30.0–36.0)
MCHC: 30.4 g/dL (ref 30.0–36.0)
MCV: 88.6 fL (ref 80.0–100.0)
MCV: 88.7 fL (ref 80.0–100.0)
Platelets: 188 10*3/uL (ref 150–400)
Platelets: 191 10*3/uL (ref 150–400)
RBC: 4.22 MIL/uL (ref 4.22–5.81)
RBC: 4.08 MIL/uL — ABNORMAL LOW (ref 4.22–5.81)
RDW: 18.8 % — ABNORMAL HIGH (ref 11.5–15.5)
RDW: 18.4 % — ABNORMAL HIGH (ref 11.5–15.5)
WBC: 12.3 10*3/uL — ABNORMAL HIGH (ref 4.0–10.5)
WBC: 10.3 10*3/uL (ref 4.0–10.5)
nRBC: 0 % (ref 0.0–0.2)
nRBC: 0 % (ref 0.0–0.2)

## 2019-11-30 LAB — BASIC METABOLIC PANEL
Anion gap: 10 (ref 5–15)
BUN: 6 mg/dL — ABNORMAL LOW (ref 8–23)
CO2: 29 mmol/L (ref 22–32)
Calcium: 8.3 mg/dL — ABNORMAL LOW (ref 8.9–10.3)
Chloride: 100 mmol/L (ref 98–111)
Creatinine, Ser: 1.29 mg/dL — ABNORMAL HIGH (ref 0.61–1.24)
GFR calc Af Amer: >60 mL/min (ref >60)
GFR calc non Af Amer: 59 mL/min — ABNORMAL LOW (ref >60)
Glucose, Bld: 102 mg/dL — ABNORMAL HIGH (ref 70–99)
Potassium: 3.5 mmol/L (ref 3.5–5.1)
Sodium: 139 mmol/L (ref 135–145)

## 2019-11-30 SURGERY — ESOPHAGOGASTRODUODENOSCOPY (EGD) WITH PROPOFOL
Anesthesia: Monitor Anesthesia Care | Laterality: Left

## 2019-11-30 MED ORDER — PROPOFOL 500 MG/50ML IV EMUL
INTRAVENOUS | Status: DC | PRN
  Administered 2019-11-30: 100 ug/kg/min via INTRAVENOUS

## 2019-11-30 MED ORDER — PROPOFOL 10 MG/ML IV BOLUS
INTRAVENOUS | Status: DC | PRN
  Administered 2019-11-30: 20 mg via INTRAVENOUS

## 2019-11-30 MED ORDER — POTASSIUM CHLORIDE 10 MEQ/100ML IV SOLN
10.0000 meq | Freq: Once | INTRAVENOUS | Status: AC
  Administered 2019-11-30: 10 meq via INTRAVENOUS
  Filled 2019-11-30: qty 100

## 2019-11-30 MED ORDER — LACTATED RINGERS IV SOLN
INTRAVENOUS | Status: AC | PRN
  Administered 2019-11-30: 20 mL/h via INTRAVENOUS

## 2019-11-30 MED ORDER — SODIUM CHLORIDE 0.9 % IV SOLN: INTRAVENOUS | Status: DC

## 2019-11-30 MED ORDER — LACTATED RINGERS IV SOLN: INTRAVENOUS | Status: DC | PRN

## 2019-11-30 SURGICAL SUPPLY — 15 items

## 2019-11-30 NOTE — Transfer of Care (Signed)
Immediate Anesthesia Transfer of Care Note  Patient: Bryan [name]  Procedure(s) Performed: ESOPHAGOGASTRODUODENOSCOPY (EGD) WITH PROPOFOL (Left )  Patient Location: Endoscopy Unit  Anesthesia Type:MAC  Level of Consciousness: awake, alert  and oriented  Airway & Oxygen Therapy: Patient Spontanous Breathing and Patient connected to nasal cannula oxygen  Post-op Assessment: Report given to RN and Post -op Vital signs reviewed and stable  Post vital signs: Reviewed and stable  Last Vitals:  Vitals Value Taken Time  BP 125/58 11/30/19 1057  Temp    Pulse 111 11/30/19 1057  Resp 37 11/30/19 1057  SpO2 97 % 11/30/19 1057  Vitals shown include unvalidated device data.  Last Pain:  Vitals:   11/30/19 1009  TempSrc: Oral  PainSc: 0-No pain      Patients Stated Pain Goal: 2 (99/71/82 0990)  Complications: No apparent anesthesia complications

## 2019-11-30 NOTE — Progress Notes (Signed)
[facility]  PROGRESS NOTE  Bryan [name] MAU:[id number] DOB: 13-Aug-1956 DOA: 11/23/2019 PCP: Rosita Fire, MD   Brief HPI:   63 year old male with history of tobacco abuse, hypertension, alcohol abuse quit in 2010, hyperlipidemia was brought to ED with complaints of chest pain shortness of breath.  Initially presented to [facility] but then was transferred to [facility] for cardiology evaluation.  Patient developed multiple episodes of watery stools.  EKG did not show significant changes.  Echocardiogram showed preserved left-ventricular systolic function.  Cardiology planning to do stress test as outpatient. CT scan abdomen and pelvis done yesterday showed gastric distention, gastric outlet obstruction, enteritis, esophageal thickening.  GI was consulted, NG tube placed with low intermittent suction.  1600 mL output from NG tube.      Subjective   Patient seen and examined, NG tube was removed yesterday.  Plan for EGD today.   Assessment/Plan:     1. Chest pain/dysphagia-patient complains of chest pain/dysphagia, cardiology was consulted.  Plan for outpatient stress test.  CT abdomen pelvis obtained today shows thickening of distal esophagus consistent with esophagitis.  Also shows gastric distention with concern for gastric outlet obstruction.  NG tube was placed yesterday, 1600  mL out from NG tube.  Abdominal x-ray obtained this morning shows improvement of gastric distention.  Nonspecific bowel gas pattern.  NG tube has been removed.    Plan for EGD today.  GI following.  Continue Protonix 40 mg IV every 12 hours.    2. Diarrhea-patient had multiple episodes of diarrhea, C. difficile PCR is negative.  Improved.  CT abdomen pelvis with contrast confirmed enteritis.  Started on IV Cipro and Flagyl.  3. Acute kidney injury on CKD stage IIIa-renal function is slowly improving, creatinine is 1.29.  Continue IV normal saline at 100 ml/hr  4. Chronic respiratory failure-patient  is supposed to wear 2.5 L of oxygen at home.  Seems that he is not compliant with wearing oxygen at home.    SpO2: 98 % O2 Flow Rate (L/min): 3 L/min   COVID-19 Labs  No results for input(s): DDIMER, FERRITIN, LDH, CRP in the last 72 hours.  Lab Results  Component Value Date   SARSCOV2NAA NEGATIVE 11/23/2019   [place] NEGATIVE 06/13/2019   [place] NEGATIVE 06/09/2019     CBG: Recent Labs  Lab 11/23/19 2214 11/25/19 0447 11/25/19 1707  GLUCAP 161* 155* 102*    CBC: Recent Labs  Lab 11/23/19 1728 11/24/19 0528 11/26/19 0314 11/27/19 0542 11/28/19 0511 11/29/19 0357 11/30/19 0312  WBC 12.7*   < > 15.7* 17.0* 17.6* 15.4* 12.3*  NEUTROABS 8.7*  --   --   --   --   --   --   HGB 14.2   < > 14.1 13.9 12.5* 12.1* 11.5*  HCT 46.1   < > 45.8 44.7 40.4 39.4 37.4*  MCV 87.8   < > 87.6 87.5 88.2 88.5 88.6  PLT 179   < > 191 189 172 187 188   < > = values in this interval not displayed.    Basic Metabolic Panel: Recent Labs  Lab 11/25/19 0532 11/26/19 0314 11/27/19 0542 11/29/19 0357 11/30/19 0312  NA 140 137 137 140 139  K 4.2 4.1 3.8 3.8 3.5  CL 92* 94* 102 103 100  CO2 33* _0 GLUCOSE 123* 97 87 88 102*  BUN 35* 35* 22 11 6*  CREATININE 2.17* 1.69* 1.35* 1.43* 1.29*  CALCIUM 9.2 8.5* 8.1*  8.4* 8.3*     Liver Function Tests: Recent Labs  Lab 11/23/19 1728 11/29/19 0357  AST 24 18  ALT 26 15  ALKPHOS 74 58  BILITOT 0.5 0.8  PROT 7.0 5.4*  ALBUMIN 3.4* 2.3*        DVT prophylaxis: Heparin  Code Status: Full code  Family Communication: No family at bedside  Disposition Plan: Patient presented with chest pain, cardiac work-up negative.  Outpatient stress test scheduled.  He continues to have abdominal distention, developed diarrhea also had difficulty swallowing.  CT abdomen pelvis showed esophagitis, gastric outlet obstruction.  Barrier to discharge-endoscopy and further recommendations per GI.     Scheduled  medications:  . [MAR Hold] amLODipine  10 mg Oral Daily  . [MAR Hold] atorvastatin  40 mg Oral q1800  . [MAR Hold] enoxaparin (LOVENOX) injection  40 mg Subcutaneous Q24H  . [MAR Hold] fluticasone furoate-vilanterol  1 puff Inhalation Daily  . [MAR Hold] gabapentin  300 mg Oral BID  . [MAR Hold] nystatin  5 mL Oral QID  . [MAR Hold] pantoprazole (PROTONIX) IV  40 mg Intravenous Q12H  . [MAR Hold] simethicone  80 mg Oral QID  . [MAR Hold] umeclidinium bromide  1 puff Inhalation Daily    Consultants:  Cardiology   Antibiotics:   Anti-infectives (From admission, onward)   Start     Dose/Rate Route Frequency Ordered Stop   11/27/19 1800  [MAR Hold]  ciprofloxacin (CIPRO) IVPB 400 mg     (MAR Hold since Sat 11/30/2019 at 1007.Hold Reason: Transfer to a Procedural area.)   400 mg 200 mL/hr over 60 Minutes Intravenous Every 12 hours 11/27/19 1658     11/27/19 1800  [MAR Hold]  metroNIDAZOLE (FLAGYL) IVPB 500 mg     (MAR Hold since Sat 11/30/2019 at 1007.Hold Reason: Transfer to a Procedural area.)   500 mg 100 mL/hr over 60 Minutes Intravenous Every 8 hours 11/27/19 1658         Objective   Vitals:   11/30/19 1009 11/30/19 1057 11/30/19 1107 11/30/19 1117  BP: (!) 153/75 (!) 125/58 (!) 126/59 137/63  Pulse: (!) 108 (!) 111 (!) 101 97  Resp: (!) 24 (!) 26 (!) 32 (!) 31  Temp: 99.7 F (37.6 C) 97.8 F (36.6 C)    TempSrc: Oral Oral    SpO2: 93% 97% 99% 98%  Weight:      Height:        Intake/Output Summary (Last 24 hours) at 11/30/2019 1124 Last data filed at 11/30/2019 1051 Gross per 24 hour  Intake 940 ml  Output 975 ml  Net -35 ml    03/18 1901 - 03/20 0700 In: 2009.1 [P.O.:840; I.V.:669.1] Out: 2375 [Urine:1875]  Filed Weights   11/28/19 0127 11/29/19 0545 11/30/19 0444  Weight: 54.3 kg 54.3 kg 55.8 kg    Physical Examination:  General-appears in no acute distress Heart-S1-S2, regular, no murmur auscultated Lungs-clear to auscultation bilaterally, no  wheezing or crackles auscultated Abdomen-soft, nontender, no organomegaly Extremities-no edema in the lower extremities Neuro-alert, oriented x3, no focal deficit noted   Data Reviewed:   Recent Results (from the past 240 hour(s))  Respiratory Panel by RT PCR (Flu A&B, Covid) - Nasopharyngeal Swab     Status: None   Collection Time: 11/23/19  8:28 PM   Specimen: Nasopharyngeal Swab  Result Value Ref Range Status   SARS Coronavirus 2 by RT PCR NEGATIVE NEGATIVE Final    Comment: (NOTE) SARS-CoV-2 target nucleic acids are NOT  DETECTED. The SARS-CoV-2 RNA is generally detectable in upper respiratoy specimens during the acute phase of infection. The lowest concentration of SARS-CoV-2 viral copies this assay can detect is 131 copies/mL. A negative result does not preclude SARS-Cov-2 infection and should not be used as the sole basis for treatment or other patient management decisions. A negative result may occur with  improper specimen collection/handling, submission of specimen other than nasopharyngeal swab, presence of viral mutation(s) within the areas targeted by this assay, and inadequate number of viral copies (<131 copies/mL). A negative result must be combined with clinical observations, patient history, and epidemiological information. The expected result is Negative. Fact Sheet for Patients:  PinkCheek.be Fact Sheet for Healthcare Providers:  GravelBags.it This test is not yet ap proved or cleared by the [place] FDA and  has been authorized for detection and/or diagnosis of SARS-CoV-2 by FDA under an Emergency Use Authorization (EUA). This EUA will remain  in effect (meaning this test can be used) for the duration of the COVID-19 declaration under Section 564(b)(1) of the Act, 21 U.S.C. section 360bbb-3(b)(1), unless the authorization is terminated or revoked sooner.    Influenza A by PCR NEGATIVE NEGATIVE  Final   Influenza B by PCR NEGATIVE NEGATIVE Final    Comment: (NOTE) The Xpert Xpress SARS-CoV-2/FLU/RSV assay is intended as an aid in  the diagnosis of influenza from Nasopharyngeal swab specimens and  should not be used as a sole basis for treatment. Nasal washings and  aspirates are unacceptable for Xpert Xpress SARS-CoV-2/FLU/RSV  testing. Fact Sheet for Patients: PinkCheek.be Fact Sheet for Healthcare Providers: GravelBags.it This test is not yet approved or cleared by the [place] FDA and  has been authorized for detection and/or diagnosis of SARS-CoV-2 by  FDA under an Emergency Use Authorization (EUA). This EUA will remain  in effect (meaning this test can be used) for the duration of the  Covid-19 declaration under Section 564(b)(1) of the Act, 21  U.S.C. section 360bbb-3(b)(1), unless the authorization is  terminated or revoked. Performed at [facility], [address] [place],  [address]   GI pathogen panel by PCR, stool     Status: None   Collection Time: 11/25/19 10:50 AM   Specimen: Stool  Result Value Ref Range Status   Plesiomonas shigelloides NOT DETECTED NOT DETECTED Final   Yersinia enterocolitica NOT DETECTED NOT DETECTED Final   Vibrio NOT DETECTED NOT DETECTED Final   Enteropathogenic E coli NOT DETECTED NOT DETECTED Final   E coli (ETEC) LT/ST NOT DETECTED NOT DETECTED Final   E coli 5537 by PCR Not applicable NOT DETECTED Final   Cryptosporidium by PCR NOT DETECTED NOT DETECTED Final   Entamoeba histolytica NOT DETECTED NOT DETECTED Final   Adenovirus F 40/41 NOT DETECTED NOT DETECTED Final   Norovirus GI/GII NOT DETECTED NOT DETECTED Final   Sapovirus NOT DETECTED NOT DETECTED Final    Comment: (NOTE) Performed At: [facility] [place], [place] [id number] Rush Farmer MD JQ:[id number]    Vibrio cholerae NOT DETECTED NOT DETECTED Final   Campylobacter by  PCR NOT DETECTED NOT DETECTED Final   Salmonella by PCR NOT DETECTED NOT DETECTED Final   E coli (STEC) NOT DETECTED NOT DETECTED Final   Enteroaggregative E coli NOT DETECTED NOT DETECTED Final   Shigella by PCR NOT DETECTED NOT DETECTED Final   Cyclospora cayetanensis NOT DETECTED NOT DETECTED Final   Astrovirus NOT DETECTED NOT DETECTED Final   G lamblia by PCR NOT  DETECTED NOT DETECTED Final   Rotavirus A by PCR NOT DETECTED NOT DETECTED Final  C difficile quick scan w PCR reflex     Status: None   Collection Time: 11/25/19 10:50 AM   Specimen: Stool  Result Value Ref Range Status   C Diff antigen NEGATIVE NEGATIVE Final   C Diff toxin NEGATIVE NEGATIVE Final   C Diff interpretation No C. difficile detected.  Final    Comment: Performed at [facility], [place] [address] [place], [place] [address]    No results for input(s): LIPASE, AMYLASE in the last 168 hours. No results for input(s): AMMONIA in the last 168 hours.  Cardiac Enzymes: No results for input(s): CKTOTAL, CKMB, CKMBINDEX, TROPONINI in the last 168 hours. BNP (last 3 results) Recent Labs    08/02/19 1526 11/09/19 1823 11/23/19 1728  BNP 83.0 85.0 105.0*    ProBNP (last 3 results) No results for input(s): PROBNP in the last 8760 hours.  Studies:  DG Abd 2 Views  Result Date: 11/29/2019 CLINICAL DATA:  Gastric outlet obstruction. EXAM: ABDOMEN - 2 VIEW COMPARISON:  CT scan 11/27/2019 FINDINGS: Upright film shows no evidence for intraperitoneal free air. Visualized lungs appear hyperexpanded. Interstitial markings are diffusely coarsened with chronic features. NG tube tip is in the region of the pylorus and transpyloric placement cannot be excluded. There is diffuse mild distention of colon with contrast material visible in the colonic lumen. Lumbar spine fixation hardware evident. IMPRESSION: 1. NG tube tip position in the region of the pylorus and transpyloric placement cannot be excluded. 2. Nonspecific  bowel gas pattern. Electronically Signed   By: Misty Stanley M.D.   On: 11/29/2019 09:06    Admission status: The appropriate admission status for this patient is INPATIENT. Inpatient status is judged to be reasonable and necessary in order to provide the required intensity of service to ensure the patient's safety. The patient's presenting symptoms, physical exam findings, and initial radiographic and laboratory data in the context of their chronic comorbidities is felt to place them at high risk for further clinical deterioration. Furthermore, it is not anticipated that the patient will be medically stable for discharge from the hospital within 2 midnights of admission. The following factors support the admission status of inpatient.     The patient's presenting symptoms include chest pain The worrisome physical exam findings include abdominal distention. The initial radiographic and laboratory data are worrisome because of CT abdomen/pelvis shows esophagitis, gastric outlet obstruction The chronic co-morbidities include tobacco abuse.       * I certify that at the point of admission it is my clinical judgment that the patient will require inpatient hospital care spanning beyond 2 midnights from the point of admission due to high intensity of service, high risk for further deterioration and high frequency of surveillance required.Oswald Hillock   [facility] If 7PM-7AM, please contact night-coverage at [email or web address], Office  [phone]   11/30/2019, 11:24 AM  LOS: 5 days

## 2019-11-30 NOTE — Progress Notes (Signed)
Patient with bloody stools, MD [place] made aware, ordered stat CBC, vitals taken stable. Will continue to monitor.

## 2019-11-30 NOTE — Anesthesia Preprocedure Evaluation (Signed)
Anesthesia Evaluation  Patient identified by MRN, date of birth, ID band Patient awake    Reviewed: Allergy & Precautions, NPO status , Patient's Chart, lab work & pertinent test results  Airway Mallampati: II  TM Distance: >3 FB Neck ROM: Full    Dental  (+) Missing   Pulmonary asthma , COPD (2.5 L [place] ),  COPD inhaler and oxygen dependent, former smoker,    Pulmonary exam normal breath sounds clear to auscultation       Cardiovascular hypertension, + angina + CAD, + Past MI and +CHF  Normal cardiovascular exam Rhythm:Regular Rate:Normal  ECG: NSR, rate 94  ECHO: 1. Left ventricular ejection fraction, by estimation, is 55 to 60%. The left ventricle has normal function. The left ventricle has no regional wall motion abnormalities. Left ventricular diastolic parameters are consistent with Grade I diastolic dysfunction (impaired relaxation). There is the interventricular septum is flattened in systole and diastole, consistent with right ventricular pressure and volume overload. 2. Right ventricular systolic function is low normal. The right ventricular size is mildly enlarged. mildly increased right ventricular wall thickness. There is moderately elevated pulmonary artery systolic pressure. The estimated right ventricular systolic pressure is 96.7 mmHg. 3. Right atrial size was mild to moderately dilated. 4. The mitral valve is grossly normal. Trivial mitral valve regurgitation. 5. Tricuspid valve regurgitation is mild to moderate. 6. The aortic valve is tricuspid. Aortic valve regurgitation is not visualized. 7. The inferior vena cava is normal in size with greater than 50% respiratory variability, suggesting right atrial pressure of 3 mmHg.   Neuro/Psych  Headaches, TIACVA, No Residual Symptoms    GI/Hepatic Neg liver ROS, GERD  Medicated and Controlled,  Endo/Other  negative endocrine ROSdiabetes  Renal/GU negative Renal  ROS     Musculoskeletal Chronic pain   Abdominal   Peds  Hematology  (+) anemia ,   Anesthesia Other Findings Day of surgery medications reviewed with the patient.  Reproductive/Obstetrics                             Anesthesia Physical Anesthesia Plan  ASA: IV  Anesthesia Plan: MAC   Post-op Pain Management:    Induction: Intravenous  PONV Risk Score and Plan: 1 and Propofol infusion and Treatment may vary due to age or medical condition  Airway Management Planned: Nasal Cannula  Additional Equipment:   Intra-op Plan:   Post-operative Plan:   Informed Consent: I have reviewed the patients History and Physical, chart, labs and discussed the procedure including the risks, benefits and alternatives for the proposed anesthesia with the patient or authorized representative who has indicated his/her understanding and acceptance.     Dental advisory given  Plan Discussed with: CRNA  Anesthesia Plan Comments:         Anesthesia Quick Evaluation

## 2019-11-30 NOTE — Interval H&P Note (Signed)
History and Physical Interval Note:  11/30/2019 10:24 AM  Bryan [name]  has presented today for surgery, with the diagnosis of nausea, vomiting.  The various methods of treatment have been discussed with the patient and family. After consideration of risks, benefits and other options for treatment, the patient has consented to  Procedure(s): ESOPHAGOGASTRODUODENOSCOPY (EGD) WITH PROPOFOL (Left) as a surgical intervention.  The patient's history has been reviewed, patient examined, no change in status, stable for surgery.  I have reviewed the patient's chart and labs.  Questions were answered to the patient's satisfaction.     Landry Dyke

## 2019-11-30 NOTE — Op Note (Signed)
[facility] Patient Name: Bryan [name] Procedure Date : 11/30/2019 MRN: [id number] Attending MD: Arta Silence , MD Date of Birth: [birth year] CSN: [id number] Age: 64 Admit Type: Inpatient Procedure:                Upper GI endoscopy Indications:              Abnormal CT of the GI tract, Nausea with vomiting Providers:                Arta Silence, MD, Glori Bickers, RN, Theodora Blow,                            Technician Referring MD:              Medicines:                Monitored Anesthesia Care Complications:            No immediate complications. Estimated Blood Loss:     Estimated blood loss: none. Procedure:                Pre-Anesthesia Assessment:                           - Prior to the procedure, a History and Physical                            was performed, and patient medications and                            allergies were reviewed. The patient's tolerance of                            previous anesthesia was also reviewed. The risks                            and benefits of the procedure and the sedation                            options and risks were discussed with the patient.                            All questions were answered, and informed consent                            was obtained. Prior Anticoagulants: The patient has                            taken no previous anticoagulant or antiplatelet                            agents. ASA Grade Assessment: IV - A patient with                            severe systemic disease that is a constant threat  to life. After reviewing the risks and benefits,                            the patient was deemed in satisfactory condition to                            undergo the procedure.                           After obtaining informed consent, the endoscope was                            passed under direct vision. Throughout the                            procedure, the  patient's blood pressure, pulse, and                            oxygen saturations were monitored continuously. The                            [id number]-H190 ([id number]) Olympus gastroscope was                            introduced through the mouth, and advanced to the                            second part of duodenum. The upper GI endoscopy was                            accomplished without difficulty. The patient                            tolerated the procedure well. Scope In: Scope Out: Findings:      LA Grade D (one or more mucosal breaks involving at least 75% of       esophageal circumference) esophagitis with no bleeding was found.      Small hiatal hernia. The exam of the esophagus was otherwise normal.      Patchy mild inflammation was found in the prepyloric region of the       stomach.      One non-bleeding cratered gastric ulcer was found in the prepyloric       region of the stomach. The lesion was 6 mm in largest dimension.      The exam of the stomach was otherwise normal. Specifically, no pyloric       stenosis or gastric outlet obstruction.      Many non-bleeding cratered duodenal ulcers with no stigmata of bleeding       were found in the duodenal bulb. The largest lesion was 10 mm in largest       dimension.      There was some mild luminal narrowing at the apex of the duodenal bulb,       but the diagnostic endoscopic could traverse this stenosis without       difficulty. The exam of the duodenum was otherwise normal.  No old or fresh blood seen to the extent of our examination. No retained       gastric contents or retained food noted in stomach. Impression:               - LA Grade D reflux esophagitis with no bleeding.                           - Gastritis.                           - Non-bleeding gastric ulcer.                           - Non-bleeding duodenal ulcers with no stigmata of                            bleeding.                           - Mild  proximal duodenal stenosis due to ulcer                            without frank obstruction. Moderate Sedation:      None Recommendation:           - Return patient to hospital ward for ongoing care.                           - Clear liquid diet today. Try slowly advancing                            tomorrow.                           - Continue present medications, including PPI and                            sucralfate.                           - Checking H. pylori antibodies.                           - Avoid NSAIDs.                           - [facility] will follow. Procedure Code(s):        --- Professional ---                           [phone], Esophagogastroduodenoscopy, flexible,                            transoral; diagnostic, including collection of                            specimen(s) by brushing or washing, when performed                            (  separate procedure) Diagnosis Code(s):        --- Professional ---                           K21.00, Gastro-esophageal reflux disease with                            esophagitis, without bleeding                           K29.70, Gastritis, unspecified, without bleeding                           K25.9, Gastric ulcer, unspecified as acute or                            chronic, without hemorrhage or perforation                           K26.9, Duodenal ulcer, unspecified as acute or                            chronic, without hemorrhage or perforation                           R11.2, Nausea with vomiting, unspecified                           R93.3, Abnormal findings on diagnostic imaging of                            other parts of digestive tract CPT copyright 2019 American [employer]. All rights reserved. The codes documented in this report are preliminary and upon coder review may  be revised to meet current compliance requirements. Arta Silence, MD 11/30/2019 11:06:01 AM This report has been signed  electronically. Number of Addenda: 0

## 2019-11-30 NOTE — Anesthesia Postprocedure Evaluation (Signed)
Anesthesia Post Note  Patient: Bryan [name]  Procedure(s) Performed: ESOPHAGOGASTRODUODENOSCOPY (EGD) WITH PROPOFOL (Left )     Patient location during evaluation: PACU Anesthesia Type: MAC Level of consciousness: awake Pain management: pain level controlled Vital Signs Assessment: post-procedure vital signs reviewed and stable Respiratory status: spontaneous breathing, nonlabored ventilation, respiratory function stable and patient connected to nasal cannula oxygen Cardiovascular status: stable and blood pressure returned to baseline Postop Assessment: no apparent nausea or vomiting Anesthetic complications: no    Last Vitals:  Vitals:   11/30/19 1140 11/30/19 1335  BP: (!) 146/76 115/66  Pulse:  (!) 104  Resp: 16   Temp: 37.2 C 37.1 C  SpO2: 98% 95%    Last Pain:  Vitals:   11/30/19 1458  TempSrc:   PainSc: 3                  Huma Imhoff P Amylynn Fano

## 2019-12-01 LAB — CBC
HCT: 35.5 % — ABNORMAL LOW (ref 39.0–52.0)
Hemoglobin: 11 g/dL — ABNORMAL LOW (ref 13.0–17.0)
MCH: 27.3 pg (ref 26.0–34.0)
MCHC: 31 g/dL (ref 30.0–36.0)
MCV: 88.1 fL (ref 80.0–100.0)
Platelets: 227 K/uL (ref 150–400)
RBC: 4.03 MIL/uL — ABNORMAL LOW (ref 4.22–5.81)
RDW: 18.1 % — ABNORMAL HIGH (ref 11.5–15.5)
WBC: 10.6 K/uL — ABNORMAL HIGH (ref 4.0–10.5)
nRBC: 0 % (ref 0.0–0.2)

## 2019-12-01 LAB — BASIC METABOLIC PANEL
Anion gap: 9 (ref 5–15)
BUN: <5 mg/dL — ABNORMAL LOW (ref 8–23)
CO2: 32 mmol/L (ref 22–32)
Calcium: 8.3 mg/dL — ABNORMAL LOW (ref 8.9–10.3)
Chloride: 98 mmol/L (ref 98–111)
Creatinine, Ser: 1.4 mg/dL — ABNORMAL HIGH (ref 0.61–1.24)
GFR calc Af Amer: >60 mL/min (ref >60)
GFR calc non Af Amer: 53 mL/min — ABNORMAL LOW (ref >60)
Glucose, Bld: 109 mg/dL — ABNORMAL HIGH (ref 70–99)
Potassium: 3.3 mmol/L — ABNORMAL LOW (ref 3.5–5.1)
Sodium: 139 mmol/L (ref 135–145)

## 2019-12-01 MED ORDER — POTASSIUM CHLORIDE CRYS ER 20 MEQ PO TBCR
40.0000 meq | EXTENDED_RELEASE_TABLET | Freq: Once | ORAL | Status: AC
  Administered 2019-12-01: 10:00:00 40 meq via ORAL

## 2019-12-01 NOTE — Significant Event (Signed)
Rapid Response Event Note  Overview: Time Called: 0526 Arrival Time: 0535 Event Type: Respiratory Called for pt having acute oxygen desaturation in the 80s, tachypnea, and chest pain on inspiration.  Initial Focused Assessment: Pt awake, alert, lying in bed. HOB elevated at 40 degrees. Pt is warm, dry to touch. States he has pain when he takes a deep breath, upon further evaluation pain location is at pt's epigastric region. Lung sounds are clear, diminished. Pt is not in acute distress and is able to speak in full sentences. Respiratory pattern is shallow, no accessory muscle use. SpO2 spot checked and pt was 88% on 3L [place]. Supplemental oxygen increased to 3.5L , oxygen saturation 90-91%. Abdomen is round, soft, bowel sounds active.  Interventions: No intervention from RR RN.   Plan of Care (if not transferred): -Continuous pulse oximetry monitoring- pt has hx of COPD and uses 2.5L supplemental oxygen at home -Wean oxygen to pt's home dose as pt tolerates -Pulmonary hygiene: encourage incentive spirometer, activity as tolerated, turning and repositioning, cough and deep breathing  Call rapid response for further needs.  Event Summary: Outcome: Stayed in room and stabalized Event End Time: [place]

## 2019-12-01 NOTE — Progress Notes (Signed)
Subjective: Little blood in stool after endoscopy yesterday. No abdominal pain, nausea, vomiting. Tolerated clear liquid diet yesterday.  Objective: Vital signs in last 24 hours: Temp:  [98.4 F (36.9 C)-99.8 F (37.7 C)] 99.8 F (37.7 C) (03/21 1142) Pulse Rate:  [102-112] 102 (03/21 1142) Resp:  [20-22] 20 (03/21 1142) BP: (114-128)/(61-72) 118/71 (03/21 1142) SpO2:  [82 %-98 %] 82 % (03/21 1142) Weight:  [56.8 kg] 56.8 kg (03/21 0429) Weight change: 0.998 kg Last BM Date: 11/30/19  PE: GEN:  NAD ABD:  Soft, non-tender  Lab Results: CBC    Component Value Date/Time   WBC 10.6 (H) 12/01/2019 0431   RBC 4.03 (L) 12/01/2019 0431   HGB 11.0 (L) 12/01/2019 0431   HCT 35.5 (L) 12/01/2019 0431   PLT 227 12/01/2019 0431   MCV 88.1 12/01/2019 0431   MCH 27.3 12/01/2019 0431   MCHC 31.0 12/01/2019 0431   RDW 18.1 (H) 12/01/2019 0431   LYMPHSABS 2.5 11/23/2019 1728   MONOABS 1.2 (H) 11/23/2019 1728   EOSABS 0.2 11/23/2019 1728   BASOSABS 0.1 11/23/2019 1728   CMP     Component Value Date/Time   NA 139 12/01/2019 0431   K 3.3 (L) 12/01/2019 0431   CL 98 12/01/2019 0431   CO2 32 12/01/2019 0431   GLUCOSE 109 (H) 12/01/2019 0431   BUN <5 (L) 12/01/2019 0431   CREATININE 1.40 (H) 12/01/2019 0431   CREATININE 1.91 (H) 02/01/2018 1109   CALCIUM 8.3 (L) 12/01/2019 0431   PROT 5.4 (L) 11/29/2019 0357   ALBUMIN 2.3 (L) 11/29/2019 0357   AST 18 11/29/2019 0357   ALT 15 11/29/2019 0357   ALKPHOS 58 11/29/2019 0357   BILITOT 0.8 11/29/2019 0357   GFRNONAA 53 (L) 12/01/2019 0431   GFRNONAA 37 (L) 02/01/2018 1109   GFRAA >60 12/01/2019 0431   GFRAA 43 (L) 02/01/2018 1109   Assessment:  1.  Duodenal and gastric ulcers. 2.  Partial proximal duodenal stenosis (without overt obstruction) due to duodenal ulcers. 3.  Blood in stool, likely old blood, likely from #1 above. 4.  Anemia, acute blood loss.  Plan:  1.  Follow CBCs, transfuse as needed. 2.  Advance to full  liquids today, possibly soft diet tomorrow (wouldn't advance more after that for a few days), pending response. 3.  IV PPI today, maybe transition to oral PPI tomorrow. 4.  Awaiting H. Pylori serologies. 5.  Possible discharge home tomorrow from GI perspective, pending clinical course. 6.  [facility] will follow.   Landry Dyke 12/01/2019, 2:51 PM   Cell [phone] If no answer or after 5 PM call [phone]

## 2019-12-01 NOTE — Progress Notes (Signed)
Pt had 12 beats V-tach.  Pt asymptomatic. Will page provider.  Will continue to monitor.

## 2019-12-01 NOTE — Progress Notes (Signed)
[facility]  PROGRESS NOTE  Bryan [name] ZOX:[id number] DOB: [birth year] DOA: 11/23/2019 PCP: Rosita Fire, MD   Brief HPI:   64 year old male with history of tobacco abuse, hypertension, alcohol abuse quit in 2010, hyperlipidemia was brought to ED with complaints of chest pain shortness of breath.  Initially presented to [facility] but then was transferred to [facility] for cardiology evaluation.  Patient developed multiple episodes of watery stools.  EKG did not show significant changes.  Echocardiogram showed preserved left-ventricular systolic function.  Cardiology planning to do stress test as outpatient. CT scan abdomen and pelvis done yesterday showed gastric distention, gastric outlet obstruction, enteritis, esophageal thickening.  GI was consulted, NG tube placed with low intermittent suction.  1600 mL output from NG tube.      Subjective   Had one episode of bloody bowel movement yesterday.  Vitals are stable.  Hemoglobin is stable.  Denies any further bloody bowel movements.  Had regular BM last night.   Assessment/Plan:     1. Chest pain/dysphagia-patient complains of chest pain/dysphagia, cardiology was consulted.  Plan for outpatient stress test.  CT abdomen pelvis obtained today shows thickening of distal esophagus consistent with esophagitis.  Also shows gastric distention with concern for gastric outlet obstruction.  NG tube was placed yesterday, 1600  mL out from NG tube.  Abdominal x-ray obtained this morning shows improvement of gastric distention.  Nonspecific bowel gas pattern.  NG tube has been removed.    Patient underwent EGD yesterday which showed nonbleeding gastric ulcer, multiple nonbleeding ulcers in the duodenum, mild proximal duodenal stenosis without frank obstruction.  Continue IV Protonix 40 mg every 12 hours.  Will advance diet to full liquid today.  2. Diarrhea-resolved, patient had multiple episodes of diarrhea, C. difficile PCR is negative.   Improved.  CT abdomen pelvis with contrast confirmed enteritis.  Started on IV Cipro and Flagyl.  Continue with IV antibiotics for now.  3. Acute kidney injury on CKD stage IIIa-renal function is slowly improving, creatinine is up to 1.40.  Likely at baseline.  Will discontinue IV fluids.  4. Chronic respiratory failure-patient is supposed to wear 2.5 L of oxygen at home.  Seems that he is not compliant with wearing oxygen at home.    SpO2: 90 % O2 Flow Rate (L/min): 4 L/min   COVID-19 Labs  No results for input(s): DDIMER, FERRITIN, LDH, CRP in the last 72 hours.  Lab Results  Component Value Date   SARSCOV2NAA NEGATIVE 11/23/2019   [place] NEGATIVE 06/13/2019   [place] NEGATIVE 06/09/2019     CBG: Recent Labs  Lab 11/25/19 0447 11/25/19 1707  GLUCAP 155* 102*    CBC: Recent Labs  Lab 11/28/19 0511 11/29/19 0357 11/30/19 0312 11/30/19 1840 12/01/19 0431  WBC 17.6* 15.4* 12.3* 10.3 10.6*  HGB 12.5* 12.1* 11.5* 11.0* 11.0*  HCT 40.4 39.4 37.4* 36.2* 35.5*  MCV 88.2 88.5 88.6 88.7 88.1  PLT 172 187 188 191 811    Basic Metabolic Panel: Recent Labs  Lab 11/26/19 0314 11/27/19 0542 11/29/19 0357 11/30/19 0312 12/01/19 0431  NA 137 137 140 139 139  K 4.1 3.8 3.8 3.5 3.3*  CL 94* 102 103 100 98  CO2 _0 32  GLUCOSE 97 87 88 102* 109*  BUN 35* 22 11 6* <5*  CREATININE 1.69* 1.35* 1.43* 1.29* 1.40*  CALCIUM 8.5* 8.1* 8.4* 8.3* 8.3*     Liver Function Tests: Recent Labs  Lab 11/29/19 0357  AST  18  ALT 15  ALKPHOS 58  BILITOT 0.8  PROT 5.4*  ALBUMIN 2.3*        DVT prophylaxis: Heparin  Code Status: Full code  Family Communication: No family at bedside  Disposition Plan: Patient presented with chest pain, cardiac work-up negative.  Outpatient stress test scheduled.  He continues to have abdominal distention, developed diarrhea also had difficulty swallowing.  CT abdomen pelvis showed esophagitis, gastric outlet obstruction.   Barrier to discharge-s/p EGD, slowly improving.     Scheduled medications:  . amLODipine  10 mg Oral Daily  . atorvastatin  40 mg Oral q1800  . enoxaparin (LOVENOX) injection  40 mg Subcutaneous Q24H  . fluticasone furoate-vilanterol  1 puff Inhalation Daily  . gabapentin  300 mg Oral BID  . nystatin  5 mL Oral QID  . pantoprazole (PROTONIX) IV  40 mg Intravenous Q12H  . simethicone  80 mg Oral QID  . umeclidinium bromide  1 puff Inhalation Daily    Consultants:  Cardiology   Antibiotics:   Anti-infectives (From admission, onward)   Start     Dose/Rate Route Frequency Ordered Stop   11/27/19 1800  ciprofloxacin (CIPRO) IVPB 400 mg     400 mg 200 mL/hr over 60 Minutes Intravenous Every 12 hours 11/27/19 1658     11/27/19 1800  metroNIDAZOLE (FLAGYL) IVPB 500 mg     500 mg 100 mL/hr over 60 Minutes Intravenous Every 8 hours 11/27/19 1658         Objective   Vitals:   12/01/19 0515 12/01/19 0548 12/01/19 0726 12/01/19 0956  BP: 126/72  128/61 122/66  Pulse: (!) 112     Resp: (!) 22 (!) _0 Temp: 98.4 F (36.9 C)  99.5 F (37.5 C) 98.8 F (37.1 C)  TempSrc: Oral  Oral Oral  SpO2: 92% 91% 90% 90%  Weight:      Height:        Intake/Output Summary (Last 24 hours) at 12/01/2019 1051 Last data filed at 12/01/2019 0755 Gross per 24 hour  Intake 849.66 ml  Output 850 ml  Net -0.34 ml    03/19 1901 - 03/21 0700 In: 580 [P.O.:480; I.V.:100] Out: 1350 [Urine:1350]  Filed Weights   11/29/19 0545 11/30/19 0444 12/01/19 0429  Weight: 54.3 kg 55.8 kg 56.8 kg    Physical Examination:  General-appears in no acute distress Heart-S1-S2, regular, no murmur auscultated Lungs-clear to auscultation bilaterally, no wheezing or crackles auscultated Abdomen-soft, nontender, no organomegaly Extremities-no edema in the lower extremities Neuro-alert, oriented x3, no focal deficit noted   Data Reviewed:   Recent Results (from the past 240 hour(s))  Respiratory  Panel by RT PCR (Flu A&B, Covid) - Nasopharyngeal Swab     Status: None   Collection Time: 11/23/19  8:28 PM   Specimen: Nasopharyngeal Swab  Result Value Ref Range Status   SARS Coronavirus 2 by RT PCR NEGATIVE NEGATIVE Final    Comment: (NOTE) SARS-CoV-2 target nucleic acids are NOT DETECTED. The SARS-CoV-2 RNA is generally detectable in upper respiratoy specimens during the acute phase of infection. The lowest concentration of SARS-CoV-2 viral copies this assay can detect is 131 copies/mL. A negative result does not preclude SARS-Cov-2 infection and should not be used as the sole basis for treatment or other patient management decisions. A negative result may occur with  improper specimen collection/handling, submission of specimen other than nasopharyngeal swab, presence of viral mutation(s) within the areas targeted by this assay, and inadequate  number of viral copies (<131 copies/mL). A negative result must be combined with clinical observations, patient history, and epidemiological information. The expected result is Negative. Fact Sheet for Patients:  PinkCheek.be Fact Sheet for Healthcare Providers:  GravelBags.it This test is not yet ap proved or cleared by the [place] FDA and  has been authorized for detection and/or diagnosis of SARS-CoV-2 by FDA under an Emergency Use Authorization (EUA). This EUA will remain  in effect (meaning this test can be used) for the duration of the COVID-19 declaration under Section 564(b)(1) of the Act, 21 U.S.C. section 360bbb-3(b)(1), unless the authorization is terminated or revoked sooner.    Influenza A by PCR NEGATIVE NEGATIVE Final   Influenza B by PCR NEGATIVE NEGATIVE Final    Comment: (NOTE) The Xpert Xpress SARS-CoV-2/FLU/RSV assay is intended as an aid in  the diagnosis of influenza from Nasopharyngeal swab specimens and  should not be used as a sole basis for  treatment. Nasal washings and  aspirates are unacceptable for Xpert Xpress SARS-CoV-2/FLU/RSV  testing. Fact Sheet for Patients: PinkCheek.be Fact Sheet for Healthcare Providers: GravelBags.it This test is not yet approved or cleared by the [place] FDA and  has been authorized for detection and/or diagnosis of SARS-CoV-2 by  FDA under an Emergency Use Authorization (EUA). This EUA will remain  in effect (meaning this test can be used) for the duration of the  Covid-19 declaration under Section 564(b)(1) of the Act, 21  U.S.C. section 360bbb-3(b)(1), unless the authorization is  terminated or revoked. Performed at [facility], [address], [place], [place] [address]   GI pathogen panel by PCR, stool     Status: None   Collection Time: 11/25/19 10:50 AM   Specimen: Stool  Result Value Ref Range Status   Plesiomonas shigelloides NOT DETECTED NOT DETECTED Final   Yersinia enterocolitica NOT DETECTED NOT DETECTED Final   Vibrio NOT DETECTED NOT DETECTED Final   Enteropathogenic E coli NOT DETECTED NOT DETECTED Final   E coli (ETEC) LT/ST NOT DETECTED NOT DETECTED Final   E coli 0277 by PCR Not applicable NOT DETECTED Final   Cryptosporidium by PCR NOT DETECTED NOT DETECTED Final   Entamoeba histolytica NOT DETECTED NOT DETECTED Final   Adenovirus F 40/41 NOT DETECTED NOT DETECTED Final   Norovirus GI/GII NOT DETECTED NOT DETECTED Final   Sapovirus NOT DETECTED NOT DETECTED Final    Comment: (NOTE) Performed At: [facility] [place], [place] [id number] Rush Farmer MD HM:[id number]    Vibrio cholerae NOT DETECTED NOT DETECTED Final   Campylobacter by PCR NOT DETECTED NOT DETECTED Final   Salmonella by PCR NOT DETECTED NOT DETECTED Final   E coli (STEC) NOT DETECTED NOT DETECTED Final   Enteroaggregative E coli NOT DETECTED NOT DETECTED Final   Shigella by PCR NOT DETECTED NOT DETECTED Final    Cyclospora cayetanensis NOT DETECTED NOT DETECTED Final   Astrovirus NOT DETECTED NOT DETECTED Final   G lamblia by PCR NOT DETECTED NOT DETECTED Final   Rotavirus A by PCR NOT DETECTED NOT DETECTED Final  C difficile quick scan w PCR reflex     Status: None   Collection Time: 11/25/19 10:50 AM   Specimen: Stool  Result Value Ref Range Status   C Diff antigen NEGATIVE NEGATIVE Final   C Diff toxin NEGATIVE NEGATIVE Final   C Diff interpretation No C. difficile detected.  Final    Comment: Performed at [facility], [place]  [address], [place], [place] [address]    BNP (last 3 results) Recent Labs    08/02/19 1526 11/09/19 1823 11/23/19 1728  BNP 83.0 85.0 105.0*    Admission status: The appropriate admission status for this patient is INPATIENT. Inpatient status is judged to be reasonable and necessary in order to provide the required intensity of service to ensure the patient's safety. The patient's presenting symptoms, physical exam findings, and initial radiographic and laboratory data in the context of their chronic comorbidities is felt to place them at high risk for further clinical deterioration. Furthermore, it is not anticipated that the patient will be medically stable for discharge from the hospital within 2 midnights of admission. The following factors support the admission status of inpatient.     The patient's presenting symptoms include chest pain The worrisome physical exam findings include abdominal distention. The initial radiographic and laboratory data are worrisome because of CT abdomen/pelvis shows esophagitis, gastric outlet obstruction The chronic co-morbidities include tobacco abuse.     * I certify that at the point of admission it is my clinical judgment that the patient will require inpatient hospital care spanning beyond 2 midnights from the point of admission due to high intensity of service, high risk for further deterioration and high frequency of  surveillance required.Oswald Hillock   [facility] If 7PM-7AM, please contact night-coverage at [email or web address], Office  [phone]   12/01/2019, 10:51 AM  LOS: 6 days

## 2019-12-02 ENCOUNTER — Inpatient Hospital Stay (HOSPITAL_COMMUNITY): Payer: Medicare Other

## 2019-12-02 LAB — BASIC METABOLIC PANEL
Anion gap: 9 (ref 5–15)
BUN: <5 mg/dL — ABNORMAL LOW (ref 8–23)
CO2: 33 mmol/L — ABNORMAL HIGH (ref 22–32)
Calcium: 8.1 mg/dL — ABNORMAL LOW (ref 8.9–10.3)
Chloride: 96 mmol/L — ABNORMAL LOW (ref 98–111)
Creatinine, Ser: 1.28 mg/dL — ABNORMAL HIGH (ref 0.61–1.24)
GFR calc Af Amer: >60 mL/min (ref >60)
GFR calc non Af Amer: 59 mL/min — ABNORMAL LOW (ref >60)
Glucose, Bld: 114 mg/dL — ABNORMAL HIGH (ref 70–99)
Potassium: 3.5 mmol/L (ref 3.5–5.1)
Sodium: 138 mmol/L (ref 135–145)

## 2019-12-02 LAB — BRAIN NATRIURETIC PEPTIDE: B Natriuretic Peptide: 86.5 pg/mL (ref 0.0–100.0)

## 2019-12-02 LAB — CBC
HCT: 36.4 % — ABNORMAL LOW (ref 39.0–52.0)
Hemoglobin: 11.4 g/dL — ABNORMAL LOW (ref 13.0–17.0)
MCH: 27.3 pg (ref 26.0–34.0)
MCHC: 31.3 g/dL (ref 30.0–36.0)
MCV: 87.1 fL (ref 80.0–100.0)
Platelets: 249 10*3/uL (ref 150–400)
RBC: 4.18 MIL/uL — ABNORMAL LOW (ref 4.22–5.81)
RDW: 17.8 % — ABNORMAL HIGH (ref 11.5–15.5)
WBC: 10.5 10*3/uL (ref 4.0–10.5)
nRBC: 0 % (ref 0.0–0.2)

## 2019-12-02 LAB — H. PYLORI ANTIBODY, IGG: H Pylori IgG: 0.47 Index Value (ref 0.00–0.79)

## 2019-12-02 MED ORDER — PANTOPRAZOLE SODIUM 40 MG PO TBEC: 40.0000 mg | DELAYED_RELEASE_TABLET | Freq: Every day | ORAL | Status: DC

## 2019-12-02 MED ORDER — POTASSIUM CHLORIDE CRYS ER 20 MEQ PO TBCR
20.0000 meq | EXTENDED_RELEASE_TABLET | Freq: Once | ORAL | Status: AC
  Administered 2019-12-02: 20 meq via ORAL
  Filled 2019-12-02: qty 1

## 2019-12-02 MED ORDER — FUROSEMIDE 10 MG/ML IJ SOLN
40.0000 mg | Freq: Once | INTRAMUSCULAR | Status: AC
  Administered 2019-12-02: 40 mg via INTRAVENOUS
  Filled 2019-12-02: qty 4

## 2019-12-02 MED ORDER — PANTOPRAZOLE SODIUM 40 MG PO TBEC
40.0000 mg | DELAYED_RELEASE_TABLET | Freq: Two times a day (BID) | ORAL | Status: DC
  Administered 2019-12-02 – 2019-12-04 (×4): 40 mg via ORAL
  Filled 2019-12-02 (×4): qty 1

## 2019-12-02 NOTE — Progress Notes (Signed)
Subjective: Feeling better; tolerating diet; no further blood in stool.  Objective: Vital signs in last 24 hours: Temp:  [99 F (37.2 C)-100.1 F (37.8 C)] 99.4 F (37.4 C) (03/22 1132) Pulse Rate:  [110-118] 117 (03/22 1132) Resp:  [17-22] 17 (03/22 1132) BP: (121-145)/(69-78) 121/71 (03/22 1132) SpO2:  [86 %-94 %] 92 % (03/22 1132) Weight:  [56 kg] 56 kg (03/22 0537) Weight change: -0.862 kg Last BM Date: 12/02/19  PE: GEN:  NAD ABD:  Soft, non-tender, non-distended  Lab Results: CBC    Component Value Date/Time   WBC 10.5 12/02/2019 0444   RBC 4.18 (L) 12/02/2019 0444   HGB 11.4 (L) 12/02/2019 0444   HCT 36.4 (L) 12/02/2019 0444   PLT 249 12/02/2019 0444   MCV 87.1 12/02/2019 0444   MCH 27.3 12/02/2019 0444   MCHC 31.3 12/02/2019 0444   RDW 17.8 (H) 12/02/2019 0444   LYMPHSABS 2.5 11/23/2019 1728   MONOABS 1.2 (H) 11/23/2019 1728   EOSABS 0.2 11/23/2019 1728   BASOSABS 0.1 11/23/2019 1728   CMP     Component Value Date/Time   NA 138 12/02/2019 0444   K 3.5 12/02/2019 0444   CL 96 (L) 12/02/2019 0444   CO2 33 (H) 12/02/2019 0444   GLUCOSE 114 (H) 12/02/2019 0444   BUN <5 (L) 12/02/2019 0444   CREATININE 1.28 (H) 12/02/2019 0444   CREATININE 1.91 (H) 02/01/2018 1109   CALCIUM 8.1 (L) 12/02/2019 0444   PROT 5.4 (L) 11/29/2019 0357   ALBUMIN 2.3 (L) 11/29/2019 0357   AST 18 11/29/2019 0357   ALT 15 11/29/2019 0357   ALKPHOS 58 11/29/2019 0357   BILITOT 0.8 11/29/2019 0357   GFRNONAA 59 (L) 12/02/2019 0444   GFRNONAA 37 (L) 02/01/2018 1109   GFRAA >60 12/02/2019 0444   GFRAA 43 (L) 02/01/2018 1109   Assessment:  1.  Duodenal and gastric ulcers. 2.  Partial proximal duodenal stenosis (without overt obstruction) due to duodenal ulcers. 3.  Blood in stool, likely old blood, likely from #1 above, resolved. 4.  Anemia, acute blood loss.  Hgb not downtrending.  Plan:  1.  Pantoprazole 40 mg po bid x 6 weeks, then 40 mg po qd thereafter indefinitely. 2.   H. Pylori pending; we can follow up as outpatient. 3.  Soft diet, advance slowly over the next couple weeks. 4.  OK to discharge home today from GI perspective. 5.  [facility] will sign-off; please call with questions; thank you for the consultation; we will arrange outpatient follow up with [place].   Landry Dyke 12/02/2019, 12:45 PM   Cell [phone] If no answer or after 5 PM call [phone]

## 2019-12-02 NOTE — Progress Notes (Signed)
[facility]  PROGRESS NOTE  Bryan [name] WUX:[id number] DOB: Jan 26, 1956 DOA: 11/23/2019 PCP: Rosita Fire, MD   Brief HPI:   64 year old male with history of tobacco abuse, hypertension, alcohol abuse quit in 2010, hyperlipidemia was brought to ED with complaints of chest pain shortness of breath.  Initially presented to [facility] but then was transferred to [facility] for cardiology evaluation.  Patient developed multiple episodes of watery stools.  EKG did not show significant changes.  Echocardiogram showed preserved left-ventricular systolic function.  Cardiology planning to do stress test as outpatient. CT scan abdomen and pelvis done yesterday showed gastric distention, gastric outlet obstruction, enteritis, esophageal thickening.  GI was consulted, NG tube placed with low intermittent suction.  1600 mL output from NG tube.      Subjective   Patient seen and examined, his breathing has gotten worse.  Now requiring 6 L/min of oxygen.  Patient is on home O2 2 to 3 L/min.  Echocardiogram showed grade 1 diastolic dysfunction, moderately elevated pulmonary artery pressure.   Assessment/Plan:     1. Chest pain/dysphagia-patient complains of chest pain/dysphagia, cardiology was consulted.  Plan for outpatient stress test.  CT abdomen pelvis obtained today shows thickening of distal esophagus consistent with esophagitis.  Also shows gastric distention with concern for gastric outlet obstruction.  NG tube was placed yesterday, 1600  mL out from NG tube.  Abdominal x-ray obtained this morning shows improvement of gastric distention.  Nonspecific bowel gas pattern.  NG tube has been removed.    Patient underwent EGD yesterday which showed nonbleeding gastric ulcer, multiple nonbleeding ulcers in the duodenum, mild proximal duodenal stenosis without frank obstruction.  Continue IV Protonix 40 mg every 12 hours.  Will advance diet to full liquid today.  2. Acute on chronic hypoxic  respiratory failure-patient is on home oxygen 2 L/min, now requiring 6 L/min.  Chest x-ray on 317 showed findings consistent with pulmonary hypertension.  Echocardiogram also confirmed moderate pulmonary artery hypertension.  Will give 1 dose of Lasix 40 mg IV.  Obtain BMP and stat chest x-ray.  Cardiology consulted for further management of diastolic heart failure, pulmonary hypertension and right ventricular dysfunction.  3. Diarrhea-resolved, patient had multiple episodes of diarrhea, C. difficile PCR is negative.  Improved.  CT abdomen pelvis with contrast confirmed enteritis.  Started on IV Cipro and Flagyl.  Patient has received 6 days of antibiotics.  Will discontinue antibiotics at this time.    4. Acute kidney injury on CKD stage IIIa-renal function is slowly improving, creatinine is up to 1.28.  Ordered IV Lasix.  Follow BMP in a.m.      SpO2: 92 % O2 Flow Rate (L/min): 3 L/min(3)   COVID-19 Labs  No results for input(s): DDIMER, FERRITIN, LDH, CRP in the last 72 hours.  Lab Results  Component Value Date   SARSCOV2NAA NEGATIVE 11/23/2019   [place] NEGATIVE 06/13/2019   [place] NEGATIVE 06/09/2019     CBG: Recent Labs  Lab 11/25/19 1707  GLUCAP 102*    CBC: Recent Labs  Lab 11/29/19 0357 11/30/19 0312 11/30/19 1840 12/01/19 0431 12/02/19 0444  WBC 15.4* 12.3* 10.3 10.6* 10.5  HGB 12.1* 11.5* 11.0* 11.0* 11.4*  HCT 39.4 37.4* 36.2* 35.5* 36.4*  MCV 88.5 88.6 88.7 88.1 87.1  PLT 187 188 191 227 253    Basic Metabolic Panel: Recent Labs  Lab 11/27/19 0542 11/29/19 0357 11/30/19 0312 12/01/19 0431 12/02/19 0444  NA 137 140 139 139 138  K 3.8  3.8 3.5 3.3* 3.5  CL 102 103 100 98 96*  CO2 _0 32 33*  GLUCOSE 87 88 102* 109* 114*  BUN 22 11 6* <5* <5*  CREATININE 1.35* 1.43* 1.29* 1.40* 1.28*  CALCIUM 8.1* 8.4* 8.3* 8.3* 8.1*     Liver Function Tests: Recent Labs  Lab 11/29/19 0357  AST 18  ALT 15  ALKPHOS 58  BILITOT 0.8  PROT  5.4*  ALBUMIN 2.3*        DVT prophylaxis: Heparin  Code Status: Full code  Family Communication: No family at bedside  Disposition Plan: Patient presented with chest pain, cardiac work-up negative.  Outpatient stress test scheduled.  He continues to have abdominal distention, developed diarrhea also had difficulty swallowing.  CT abdomen pelvis showed esophagitis, gastric outlet obstruction.  Barrier to discharge-acute on chronic hypoxic respiratory failure.  Requiring 6 L/min of oxygen.     Scheduled medications:  . amLODipine  10 mg Oral Daily  . atorvastatin  40 mg Oral q1800  . enoxaparin (LOVENOX) injection  40 mg Subcutaneous Q24H  . fluticasone furoate-vilanterol  1 puff Inhalation Daily  . gabapentin  300 mg Oral BID  . nystatin  5 mL Oral QID  . pantoprazole (PROTONIX) IV  40 mg Intravenous Q12H  . simethicone  80 mg Oral QID  . umeclidinium bromide  1 puff Inhalation Daily    Consultants:  Cardiology   Antibiotics:   Anti-infectives (From admission, onward)   Start     Dose/Rate Route Frequency Ordered Stop   11/27/19 1800  ciprofloxacin (CIPRO) IVPB 400 mg     400 mg 200 mL/hr over 60 Minutes Intravenous Every 12 hours 11/27/19 1658     11/27/19 1800  metroNIDAZOLE (FLAGYL) IVPB 500 mg     500 mg 100 mL/hr over 60 Minutes Intravenous Every 8 hours 11/27/19 1658         Objective   Vitals:   12/02/19 0738 12/02/19 0741 12/02/19 0900 12/02/19 1132  BP:    121/71  Pulse:    (!) 117  Resp:    17  Temp:    99.4 F (37.4 C)  TempSrc:    Oral  SpO2: (!) 86% (!) 88% 90% 92%  Weight:      Height:        Intake/Output Summary (Last 24 hours) at 12/02/2019 1412 Last data filed at 12/02/2019 1330 Gross per 24 hour  Intake 2237.66 ml  Output 3851 ml  Net -1613.34 ml    03/20 1901 - 03/22 0700 In: 2607.3 [P.O.:1080; I.V.:365.1] Out: 3702 [Urine:3700]  Filed Weights   11/30/19 0444 12/01/19 0429 12/02/19 0537  Weight: 55.8 kg 56.8 kg 56 kg     Physical Examination:  General-appears in no acute distress Heart-S1-S2, regular, no murmur auscultated Lungs-clear to auscultation bilaterally, no wheezing or crackles auscultated Abdomen-soft, nontender, no organomegaly Extremities-no edema in the lower extremities Neuro-alert, oriented x3, no focal deficit noted  Data Reviewed:   Recent Results (from the past 240 hour(s))  Respiratory Panel by RT PCR (Flu A&B, Covid) - Nasopharyngeal Swab     Status: None   Collection Time: 11/23/19  8:28 PM   Specimen: Nasopharyngeal Swab  Result Value Ref Range Status   SARS Coronavirus 2 by RT PCR NEGATIVE NEGATIVE Final    Comment: (NOTE) SARS-CoV-2 target nucleic acids are NOT DETECTED. The SARS-CoV-2 RNA is generally detectable in upper respiratoy specimens during the acute phase of infection. The lowest concentration of SARS-CoV-2 viral  copies this assay can detect is 131 copies/mL. A negative result does not preclude SARS-Cov-2 infection and should not be used as the sole basis for treatment or other patient management decisions. A negative result may occur with  improper specimen collection/handling, submission of specimen other than nasopharyngeal swab, presence of viral mutation(s) within the areas targeted by this assay, and inadequate number of viral copies (<131 copies/mL). A negative result must be combined with clinical observations, patient history, and epidemiological information. The expected result is Negative. Fact Sheet for Patients:  PinkCheek.be Fact Sheet for Healthcare Providers:  GravelBags.it This test is not yet ap proved or cleared by the [place] FDA and  has been authorized for detection and/or diagnosis of SARS-CoV-2 by FDA under an Emergency Use Authorization (EUA). This EUA will remain  in effect (meaning this test can be used) for the duration of the COVID-19 declaration under Section  564(b)(1) of the Act, 21 U.S.C. section 360bbb-3(b)(1), unless the authorization is terminated or revoked sooner.    Influenza A by PCR NEGATIVE NEGATIVE Final   Influenza B by PCR NEGATIVE NEGATIVE Final    Comment: (NOTE) The Xpert Xpress SARS-CoV-2/FLU/RSV assay is intended as an aid in  the diagnosis of influenza from Nasopharyngeal swab specimens and  should not be used as a sole basis for treatment. Nasal washings and  aspirates are unacceptable for Xpert Xpress SARS-CoV-2/FLU/RSV  testing. Fact Sheet for Patients: PinkCheek.be Fact Sheet for Healthcare Providers: GravelBags.it This test is not yet approved or cleared by the [place] FDA and  has been authorized for detection and/or diagnosis of SARS-CoV-2 by  FDA under an Emergency Use Authorization (EUA). This EUA will remain  in effect (meaning this test can be used) for the duration of the  Covid-19 declaration under Section 564(b)(1) of the Act, 21  U.S.C. section 360bbb-3(b)(1), unless the authorization is  terminated or revoked. Performed at [facility], [address], [place], [place] [address]   GI pathogen panel by PCR, stool     Status: None   Collection Time: 11/25/19 10:50 AM   Specimen: Stool  Result Value Ref Range Status   Plesiomonas shigelloides NOT DETECTED NOT DETECTED Final   Yersinia enterocolitica NOT DETECTED NOT DETECTED Final   Vibrio NOT DETECTED NOT DETECTED Final   Enteropathogenic E coli NOT DETECTED NOT DETECTED Final   E coli (ETEC) LT/ST NOT DETECTED NOT DETECTED Final   E coli 9381 by PCR Not applicable NOT DETECTED Final   Cryptosporidium by PCR NOT DETECTED NOT DETECTED Final   Entamoeba histolytica NOT DETECTED NOT DETECTED Final   Adenovirus F 40/41 NOT DETECTED NOT DETECTED Final   Norovirus GI/GII NOT DETECTED NOT DETECTED Final   Sapovirus NOT DETECTED NOT DETECTED Final    Comment: (NOTE) Performed At: [facility] [place], [place] [id number] Rush Farmer MD CV:[id number]    Vibrio cholerae NOT DETECTED NOT DETECTED Final   Campylobacter by PCR NOT DETECTED NOT DETECTED Final   Salmonella by PCR NOT DETECTED NOT DETECTED Final   E coli (STEC) NOT DETECTED NOT DETECTED Final   Enteroaggregative E coli NOT DETECTED NOT DETECTED Final   Shigella by PCR NOT DETECTED NOT DETECTED Final   Cyclospora cayetanensis NOT DETECTED NOT DETECTED Final   Astrovirus NOT DETECTED NOT DETECTED Final   G lamblia by PCR NOT DETECTED NOT DETECTED Final   Rotavirus A by PCR NOT DETECTED NOT DETECTED Final  C difficile quick scan w PCR reflex  Status: None   Collection Time: 11/25/19 10:50 AM   Specimen: Stool  Result Value Ref Range Status   C Diff antigen NEGATIVE NEGATIVE Final   C Diff toxin NEGATIVE NEGATIVE Final   C Diff interpretation No C. difficile detected.  Final    Comment: Performed at [facility], [place] [address] [place], [place] [address]    BNP (last 3 results) Recent Labs    08/02/19 1526 11/09/19 1823 11/23/19 1728  BNP 83.0 85.0 105.0*    Admission status: The appropriate admission status for this patient is INPATIENT. Inpatient status is judged to be reasonable and necessary in order to provide the required intensity of service to ensure the patient's safety. The patient's presenting symptoms, physical exam findings, and initial radiographic and laboratory data in the context of their chronic comorbidities is felt to place them at high risk for further clinical deterioration. Furthermore, it is not anticipated that the patient will be medically stable for discharge from the hospital within 2 midnights of admission. The following factors support the admission status of inpatient.     The patient's presenting symptoms include chest pain The worrisome physical exam findings include abdominal distention. The initial radiographic and laboratory data are  worrisome because of CT abdomen/pelvis shows esophagitis, gastric outlet obstruction The chronic co-morbidities include tobacco abuse.     * I certify that at the point of admission it is my clinical judgment that the patient will require inpatient hospital care spanning beyond 2 midnights from the point of admission due to high intensity of service, high risk for further deterioration and high frequency of surveillance required.Oswald Hillock   [facility] If 7PM-7AM, please contact night-coverage at [email or web address], Office  [phone]   12/02/2019, 2:12 PM  LOS: 7 days

## 2019-12-02 NOTE — Progress Notes (Signed)
Updated pt on discharge home. Verbalized understanding and need to continue treatment.

## 2019-12-02 NOTE — Care Management Important Message (Signed)
Important Message  Patient Details  Name: Bryan [name] MRN: [id number] Date of Birth: 16-Nov-1955   Medicare Important Message Given:  Yes     Shelda Altes 12/02/2019, 12:11 PM

## 2019-12-02 NOTE — Progress Notes (Signed)
RT NOTES: Patient on 4lpm nasal cannula, sats 86%. Increased o2 to 6lpm. Will continue to monitor.

## 2019-12-03 ENCOUNTER — Encounter (HOSPITAL_COMMUNITY)
Admission: EM | Disposition: A | Discharge status: Home / Self Care | Payer: Self-pay | Source: Home / Self Care | Attending: Family Medicine

## 2019-12-03 DIAGNOSIS — I2723 Pulmonary hypertension due to lung diseases and hypoxia: Secondary | ICD-10-CM

## 2019-12-03 DIAGNOSIS — J9621 Acute and chronic respiratory failure with hypoxia: Secondary | ICD-10-CM

## 2019-12-03 DIAGNOSIS — J449 Chronic obstructive pulmonary disease, unspecified: Secondary | ICD-10-CM

## 2019-12-03 HISTORY — PX: RIGHT/LEFT HEART CATH AND CORONARY ANGIOGRAPHY: CATH118266

## 2019-12-03 LAB — BASIC METABOLIC PANEL
Anion gap: 12 (ref 5–15)
BUN: 6 mg/dL — ABNORMAL LOW (ref 8–23)
CO2: 37 mmol/L — ABNORMAL HIGH (ref 22–32)
Calcium: 8.8 mg/dL — ABNORMAL LOW (ref 8.9–10.3)
Chloride: 90 mmol/L — ABNORMAL LOW (ref 98–111)
Creatinine, Ser: 1.46 mg/dL — ABNORMAL HIGH (ref 0.61–1.24)
GFR calc Af Amer: 58 mL/min — ABNORMAL LOW (ref >60)
GFR calc non Af Amer: 50 mL/min — ABNORMAL LOW (ref >60)
Glucose, Bld: 123 mg/dL — ABNORMAL HIGH (ref 70–99)
Potassium: 3.6 mmol/L (ref 3.5–5.1)
Sodium: 139 mmol/L (ref 135–145)

## 2019-12-03 LAB — POCT I-STAT EG7
Acid-Base Excess: 11 mmol/L — ABNORMAL HIGH (ref 0.0–2.0)
Bicarbonate: 39.8 mmol/L — ABNORMAL HIGH (ref 20.0–28.0)
Calcium, Ion: 1.08 mmol/L — ABNORMAL LOW (ref 1.15–1.40)
HCT: 34 % — ABNORMAL LOW (ref 39.0–52.0)
Hemoglobin: 11.6 g/dL — ABNORMAL LOW (ref 13.0–17.0)
O2 Saturation: 64 %
Potassium: 3.3 mmol/L — ABNORMAL LOW (ref 3.5–5.1)
Sodium: 137 mmol/L (ref 135–145)
TCO2: 42 mmol/L — ABNORMAL HIGH (ref 22–32)
pCO2, Ven: 75.6 mmHg (ref 44.0–60.0)
pH, Ven: 7.33 (ref 7.250–7.430)
pO2, Ven: 38 mmHg (ref 32.0–45.0)

## 2019-12-03 SURGERY — RIGHT/LEFT HEART CATH AND CORONARY ANGIOGRAPHY
Anesthesia: LOCAL

## 2019-12-03 MED ORDER — HEPARIN (PORCINE) IN NACL 1000-0.9 UT/500ML-% IV SOLN
INTRAVENOUS | Status: DC | PRN
  Administered 2019-12-03: 500 mL

## 2019-12-03 MED ORDER — HEPARIN (PORCINE) IN NACL 1000-0.9 UT/500ML-% IV SOLN
INTRAVENOUS | Status: AC
  Filled 2019-12-03: qty 1000

## 2019-12-03 MED ORDER — LIDOCAINE HCL (PF) 1 % IJ SOLN
INTRAMUSCULAR | Status: DC | PRN
  Administered 2019-12-03: 2 mL
  Administered 2019-12-03: 1 mL

## 2019-12-03 MED ORDER — ONDANSETRON HCL 4 MG/2ML IJ SOLN: 4.0000 mg | Freq: Four times a day (QID) | INTRAMUSCULAR | Status: DC | PRN

## 2019-12-03 MED ORDER — LIDOCAINE HCL (PF) 1 % IJ SOLN
INTRAMUSCULAR | Status: AC
  Filled 2019-12-03: qty 30

## 2019-12-03 MED ORDER — SODIUM CHLORIDE 0.9 % WEIGHT BASED INFUSION: 3.0000 mL/kg/h | INTRAVENOUS | Status: DC

## 2019-12-03 MED ORDER — SODIUM CHLORIDE 0.9 % WEIGHT BASED INFUSION: 1.0000 mL/kg/h | INTRAVENOUS | Status: DC

## 2019-12-03 MED ORDER — ASPIRIN 81 MG PO CHEW: 81.0000 mg | CHEWABLE_TABLET | ORAL | Status: DC

## 2019-12-03 MED ORDER — IOHEXOL 350 MG/ML SOLN
INTRAVENOUS | Status: DC | PRN
  Administered 2019-12-03: 20 mL

## 2019-12-03 MED ORDER — SODIUM CHLORIDE 0.9 % IV SOLN: 250.0000 mL | INTRAVENOUS | Status: DC | PRN

## 2019-12-03 MED ORDER — FENTANYL CITRATE (PF) 100 MCG/2ML IJ SOLN
INTRAMUSCULAR | Status: AC
  Filled 2019-12-03: qty 2

## 2019-12-03 MED ORDER — MIDAZOLAM HCL 2 MG/2ML IJ SOLN
INTRAMUSCULAR | Status: AC
  Filled 2019-12-03: qty 2

## 2019-12-03 MED ORDER — METOPROLOL TARTRATE 12.5 MG HALF TABLET
12.5000 mg | ORAL_TABLET | Freq: Two times a day (BID) | ORAL | Status: DC
  Administered 2019-12-03 – 2019-12-04 (×3): 12.5 mg via ORAL
  Filled 2019-12-03 (×3): qty 1

## 2019-12-03 MED ORDER — SODIUM CHLORIDE 0.9 % WEIGHT BASED INFUSION
1.0000 mL/kg/h | INTRAVENOUS | Status: DC
  Administered 2019-12-03: 15:00:00 1 mL/kg/h via INTRAVENOUS

## 2019-12-03 MED ORDER — HYDRALAZINE HCL 20 MG/ML IJ SOLN: 10.0000 mg | INTRAMUSCULAR | Status: AC | PRN

## 2019-12-03 MED ORDER — ACETAMINOPHEN 325 MG PO TABS
650.0000 mg | ORAL_TABLET | ORAL | Status: DC | PRN
  Administered 2019-12-04: 650 mg via ORAL
  Filled 2019-12-03: qty 2

## 2019-12-03 MED ORDER — FENTANYL CITRATE (PF) 100 MCG/2ML IJ SOLN
INTRAMUSCULAR | Status: DC | PRN
  Administered 2019-12-03: 25 ug via INTRAVENOUS

## 2019-12-03 MED ORDER — VERAPAMIL HCL 2.5 MG/ML IV SOLN
INTRAVENOUS | Status: DC | PRN
  Administered 2019-12-03: 16:00:00 10 mL via INTRA_ARTERIAL

## 2019-12-03 MED ORDER — HEPARIN SODIUM (PORCINE) 1000 UNIT/ML IJ SOLN
INTRAMUSCULAR | Status: AC
  Filled 2019-12-03: qty 1

## 2019-12-03 MED ORDER — SODIUM CHLORIDE 0.9% FLUSH: 3.0000 mL | Freq: Two times a day (BID) | INTRAVENOUS | Status: DC

## 2019-12-03 MED ORDER — ASPIRIN 81 MG PO CHEW
81.0000 mg | CHEWABLE_TABLET | ORAL | Status: AC
  Administered 2019-12-03: 81 mg via ORAL
  Filled 2019-12-03: qty 1

## 2019-12-03 MED ORDER — SODIUM CHLORIDE 0.9% FLUSH
3.0000 mL | Freq: Two times a day (BID) | INTRAVENOUS | Status: DC
  Administered 2019-12-03 – 2019-12-04 (×3): 3 mL via INTRAVENOUS

## 2019-12-03 MED ORDER — SODIUM CHLORIDE 0.9 % WEIGHT BASED INFUSION
3.0000 mL/kg/h | INTRAVENOUS | Status: AC
  Administered 2019-12-03: 3 mL/kg/h via INTRAVENOUS

## 2019-12-03 MED ORDER — LABETALOL HCL 5 MG/ML IV SOLN: 10.0000 mg | INTRAVENOUS | Status: AC | PRN

## 2019-12-03 MED ORDER — MIDAZOLAM HCL 2 MG/2ML IJ SOLN
INTRAMUSCULAR | Status: DC | PRN
  Administered 2019-12-03: 1 mg via INTRAVENOUS

## 2019-12-03 MED ORDER — SODIUM CHLORIDE 0.9 % IV SOLN: INTRAVENOUS | Status: AC

## 2019-12-03 MED ORDER — HEPARIN SODIUM (PORCINE) 1000 UNIT/ML IJ SOLN
INTRAMUSCULAR | Status: DC | PRN
  Administered 2019-12-03: 2500 [IU] via INTRAVENOUS

## 2019-12-03 MED ORDER — SODIUM CHLORIDE 0.9% FLUSH: 3.0000 mL | INTRAVENOUS | Status: DC | PRN

## 2019-12-03 MED ORDER — VERAPAMIL HCL 2.5 MG/ML IV SOLN
INTRAVENOUS | Status: AC
  Filled 2019-12-03: qty 2

## 2019-12-03 SURGICAL SUPPLY — 13 items
CATH 5FR JL3.5 JR4 ANG PIG MP (CATHETERS) ×1 IMPLANT
CATH BALLN WEDGE 5F 110CM (CATHETERS) ×1 IMPLANT
DEVICE RAD COMP TR BAND LRG (VASCULAR PRODUCTS) ×1 IMPLANT
GLIDESHEATH SLEND A-KIT 6F 22G (SHEATH) ×1 IMPLANT
GUIDEWIRE .025 260CM (WIRE) ×1 IMPLANT
GUIDEWIRE INQWIRE 1.5J.035X260 (WIRE) IMPLANT
INQWIRE 1.5J .035X260CM (WIRE) ×2
KIT ENCORE 26 ADVANTAGE (KITS) IMPLANT
KIT HEART LEFT (KITS) ×2 IMPLANT
PACK CARDIAC CATHETERIZATION (CUSTOM PROCEDURE TRAY) ×2 IMPLANT
SHEATH GLIDE SLENDER 4/5FR (SHEATH) ×1 IMPLANT
TRANSDUCER W/STOPCOCK (MISCELLANEOUS) ×2 IMPLANT
TUBING CIL FLEX 10 FLL-RA (TUBING) ×2 IMPLANT

## 2019-12-03 NOTE — Progress Notes (Signed)
[facility]  PROGRESS NOTE  Bryan [name] GLO:[id number] DOB: [birth year] DOA: 11/23/2019 PCP: Rosita Fire, MD   Brief HPI:   64 year old male with history of tobacco abuse, hypertension, alcohol abuse quit in 2010, hyperlipidemia was brought to ED with complaints of chest pain shortness of breath.  Initially presented to [facility] but then was transferred to [facility] for cardiology evaluation.  Patient developed multiple episodes of watery stools.  EKG did not show significant changes.  Echocardiogram showed preserved left-ventricular systolic function.  Cardiology planning to do stress test as outpatient. CT scan abdomen and pelvis done yesterday showed gastric distention, gastric outlet obstruction, enteritis, esophageal thickening.  GI was consulted, NG tube placed with low intermittent suction.  1600 mL output from NG tube.      Subjective   Patient seen and examined, still requiring 6 L pulled off oxygen.  Diuresed well with IV Lasix given yesterday.  Cardiology has been consulted and plan for right and left heart cath today.   Assessment/Plan:     1. Chest pain/dysphagia-patient complains of chest pain/dysphagia, cardiology was consulted.  Plan for outpatient stress test.  CT abdomen pelvis obtained today shows thickening of distal esophagus consistent with esophagitis.  Also shows gastric distention with concern for gastric outlet obstruction.  NG tube was placed yesterday, 1600  mL out from NG tube.  Abdominal x-ray obtained this morning shows improvement of gastric distention.  Nonspecific bowel gas pattern.  NG tube has been removed.    Patient underwent EGD yesterday which showed nonbleeding gastric ulcer, multiple nonbleeding ulcers in the duodenum, mild proximal duodenal stenosis without frank obstruction.  Continue IV Protonix 40 mg every 12 hours.  Will advance diet to full liquid today.  2. Acute on chronic hypoxic respiratory failure-patient is on home oxygen  2 L/min, now requiring 6 L/min.  Chest x-ray on 317 showed findings consistent with pulmonary hypertension.  Echocardiogram also confirmed moderate pulmonary artery hypertension.  He was given 1 dose of Lasix 40 mg IV.  Will give 1 dose of Lasix 40 mg IV with good diuretic response.  BNP is only 86.5.  Cardiology was consulted for management of diastolic heart failure, pulmonary hypertension, right ventricle dysfunction.  Plan for right and left heart cath as per cardiology today.   3. Diarrhea-resolved, patient had multiple episodes of diarrhea, C. difficile PCR is negative.  Improved.  CT abdomen pelvis with contrast confirmed enteritis.  Started on IV Cipro and Flagyl.  Patient has received 6 days of antibiotics.  Antibiotics were discontinued.  4. Acute kidney injury on CKD stage IIIa-renal function is slowly improving, creatinine is up to 1.46 after IV Lasix was given yesterday.  Follow BMP in a.m.      SpO2: 93 % O2 Flow Rate (L/min): 3 L/min   COVID-19 Labs  No results for input(s): DDIMER, FERRITIN, LDH, CRP in the last 72 hours.  Lab Results  Component Value Date   SARSCOV2NAA NEGATIVE 11/23/2019   [facility] NEGATIVE 06/13/2019   [place] NEGATIVE 06/09/2019     CBG: No results for input(s): GLUCAP in the last 168 hours.  CBC: Recent Labs  Lab 11/29/19 0357 11/30/19 0312 11/30/19 1840 12/01/19 0431 12/02/19 0444  WBC 15.4* 12.3* 10.3 10.6* 10.5  HGB 12.1* 11.5* 11.0* 11.0* 11.4*  HCT 39.4 37.4* 36.2* 35.5* 36.4*  MCV 88.5 88.6 88.7 88.1 87.1  PLT 187 188 191 227 188    Basic Metabolic Panel: Recent Labs  Lab 11/29/19 0357 11/30/19 4166  12/01/19 0431 12/02/19 0444 12/03/19 0840  NA 140 139 139 138 139  K 3.8 3.5 3.3* 3.5 3.6  CL 103 100 98 96* 90*  CO2 26 29 32 33* 37*  GLUCOSE 88 102* 109* 114* 123*  BUN 11 6* <5* <5* 6*  CREATININE 1.43* 1.29* 1.40* 1.28* 1.46*  CALCIUM 8.4* 8.3* 8.3* 8.1* 8.8*     Liver Function Tests: Recent Labs  Lab  11/29/19 0357  AST 18  ALT 15  ALKPHOS 58  BILITOT 0.8  PROT 5.4*  ALBUMIN 2.3*        DVT prophylaxis: Heparin  Code Status: Full code  Family Communication: No family at bedside  Disposition Plan: Patient presented with chest pain, cardiac work-up negative.  Outpatient stress test scheduled.  He continues to have abdominal distention, developed diarrhea also had difficulty swallowing.  CT abdomen pelvis showed esophagitis, gastric outlet obstruction.  Barrier to discharge-acute on chronic hypoxic respiratory failure.  Requiring 6 L/min of oxygen.  Left and right heart cath as per cardiology.     Scheduled medications:  . amLODipine  10 mg Oral Daily  . atorvastatin  40 mg Oral q1800  . enoxaparin (LOVENOX) injection  40 mg Subcutaneous Q24H  . fluticasone furoate-vilanterol  1 puff Inhalation Daily  . gabapentin  300 mg Oral BID  . metoprolol tartrate  12.5 mg Oral BID  . pantoprazole  40 mg Oral BID   Followed by  . [START ON 01/11/2020] pantoprazole  40 mg Oral Daily  . simethicone  80 mg Oral QID  . sodium chloride flush  3 mL Intravenous Q12H  . umeclidinium bromide  1 puff Inhalation Daily    Consultants:  Cardiology   Antibiotics:   Anti-infectives (From admission, onward)   Start     Dose/Rate Route Frequency Ordered Stop   11/27/19 1800  ciprofloxacin (CIPRO) IVPB 400 mg  Status:  Discontinued     400 mg 200 mL/hr over 60 Minutes Intravenous Every 12 hours 11/27/19 1658 12/02/19 1610   11/27/19 1800  metroNIDAZOLE (FLAGYL) IVPB 500 mg  Status:  Discontinued     500 mg 100 mL/hr over 60 Minutes Intravenous Every 8 hours 11/27/19 1658 12/02/19 1610       Objective   Vitals:   12/03/19 1009 12/03/19 1020 12/03/19 1156 12/03/19 1158  BP: 111/67  95/68 104/66  Pulse: (!) 102  88 87  Resp: _0 Temp: 99.6 F (37.6 C)  99 F (37.2 C) 98.7 F (37.1 C)  TempSrc: Oral  Oral Oral  SpO2: 97% 93% 93% 93%  Weight:      Height:         Intake/Output Summary (Last 24 hours) at 12/03/2019 1448 Last data filed at 12/03/2019 1300 Gross per 24 hour  Intake 600 ml  Output 1521 ml  Net -921 ml    03/21 1901 - 03/23 0700 In: 2189 [P.O.:960; I.V.:66.8] Out: 4471 [Urine:4470]  Filed Weights   12/01/19 0429 12/02/19 0537 12/03/19 0442  Weight: 56.8 kg 56 kg 53.5 kg    Physical Examination:  General-appears in no acute distress Heart-S1-S2, regular, no murmur auscultated Lungs-decreased breath sounds at lung bases Abdomen-soft, nontender, no organomegaly Extremities-no edema in the lower extremities Neuro-alert, oriented x3, no focal deficit noted  Data Reviewed:   Recent Results (from the past 240 hour(s))  Respiratory Panel by RT PCR (Flu A&B, Covid) - Nasopharyngeal Swab     Status: None   Collection Time: 11/23/19  8:28 PM   Specimen: Nasopharyngeal Swab  Result Value Ref Range Status   SARS Coronavirus 2 by RT PCR NEGATIVE NEGATIVE Final    Comment: (NOTE) SARS-CoV-2 target nucleic acids are NOT DETECTED. The SARS-CoV-2 RNA is generally detectable in upper respiratoy specimens during the acute phase of infection. The lowest concentration of SARS-CoV-2 viral copies this assay can detect is 131 copies/mL. A negative result does not preclude SARS-Cov-2 infection and should not be used as the sole basis for treatment or other patient management decisions. A negative result may occur with  improper specimen collection/handling, submission of specimen other than nasopharyngeal swab, presence of viral mutation(s) within the areas targeted by this assay, and inadequate number of viral copies (<131 copies/mL). A negative result must be combined with clinical observations, patient history, and epidemiological information. The expected result is Negative. Fact Sheet for Patients:  PinkCheek.be Fact Sheet for Healthcare Providers:  GravelBags.it This test  is not yet ap proved or cleared by the [place] FDA and  has been authorized for detection and/or diagnosis of SARS-CoV-2 by FDA under an Emergency Use Authorization (EUA). This EUA will remain  in effect (meaning this test can be used) for the duration of the COVID-19 declaration under Section 564(b)(1) of the Act, 21 U.S.C. section 360bbb-3(b)(1), unless the authorization is terminated or revoked sooner.    Influenza A by PCR NEGATIVE NEGATIVE Final   Influenza B by PCR NEGATIVE NEGATIVE Final    Comment: (NOTE) The Xpert Xpress SARS-CoV-2/FLU/RSV assay is intended as an aid in  the diagnosis of influenza from Nasopharyngeal swab specimens and  should not be used as a sole basis for treatment. Nasal washings and  aspirates are unacceptable for Xpert Xpress SARS-CoV-2/FLU/RSV  testing. Fact Sheet for Patients: PinkCheek.be Fact Sheet for Healthcare Providers: GravelBags.it This test is not yet approved or cleared by the [place] FDA and  has been authorized for detection and/or diagnosis of SARS-CoV-2 by  FDA under an Emergency Use Authorization (EUA). This EUA will remain  in effect (meaning this test can be used) for the duration of the  Covid-19 declaration under Section 564(b)(1) of the Act, 21  U.S.C. section 360bbb-3(b)(1), unless the authorization is  terminated or revoked. Performed at [facility], [address], [place], [place] [address]   GI pathogen panel by PCR, stool     Status: None   Collection Time: 11/25/19 10:50 AM   Specimen: Stool  Result Value Ref Range Status   Plesiomonas shigelloides NOT DETECTED NOT DETECTED Final   Yersinia enterocolitica NOT DETECTED NOT DETECTED Final   Vibrio NOT DETECTED NOT DETECTED Final   Enteropathogenic E coli NOT DETECTED NOT DETECTED Final   E coli (ETEC) LT/ST NOT DETECTED NOT DETECTED Final   E coli 5615 by PCR Not applicable NOT DETECTED Final    Cryptosporidium by PCR NOT DETECTED NOT DETECTED Final   Entamoeba histolytica NOT DETECTED NOT DETECTED Final   Adenovirus F 40/41 NOT DETECTED NOT DETECTED Final   Norovirus GI/GII NOT DETECTED NOT DETECTED Final   Sapovirus NOT DETECTED NOT DETECTED Final    Comment: (NOTE) Performed At: [facility] [place], [place] [id number] Rush Farmer MD YJ:[id number]    Vibrio cholerae NOT DETECTED NOT DETECTED Final   Campylobacter by PCR NOT DETECTED NOT DETECTED Final   Salmonella by PCR NOT DETECTED NOT DETECTED Final   E coli (STEC) NOT DETECTED NOT DETECTED Final   Enteroaggregative E coli NOT DETECTED NOT DETECTED  Final   Shigella by PCR NOT DETECTED NOT DETECTED Final   Cyclospora cayetanensis NOT DETECTED NOT DETECTED Final   Astrovirus NOT DETECTED NOT DETECTED Final   G lamblia by PCR NOT DETECTED NOT DETECTED Final   Rotavirus A by PCR NOT DETECTED NOT DETECTED Final  C difficile quick scan w PCR reflex     Status: None   Collection Time: 11/25/19 10:50 AM   Specimen: Stool  Result Value Ref Range Status   C Diff antigen NEGATIVE NEGATIVE Final   C Diff toxin NEGATIVE NEGATIVE Final   C Diff interpretation No C. difficile detected.  Final    Comment: Performed at [facility], [place] [address], [place], [place] [address]    BNP (last 3 results) Recent Labs    11/09/19 1823 11/23/19 1728 12/02/19 1407  BNP 85.0 105.0* 86.5    Admission status: The appropriate admission status for this patient is INPATIENT. Inpatient status is judged to be reasonable and necessary in order to provide the required intensity of service to ensure the patient's safety. The patient's presenting symptoms, physical exam findings, and initial radiographic and laboratory data in the context of their chronic comorbidities is felt to place them at high risk for further clinical deterioration. Furthermore, it is not anticipated that the patient will be medically stable for  discharge from the hospital within 2 midnights of admission. The following factors support the admission status of inpatient.     The patient's presenting symptoms include chest pain The worrisome physical exam findings include abdominal distention. The initial radiographic and laboratory data are worrisome because of CT abdomen/pelvis shows esophagitis, gastric outlet obstruction The chronic co-morbidities include tobacco abuse.     * I certify that at the point of admission it is my clinical judgment that the patient will require inpatient hospital care spanning beyond 2 midnights from the point of admission due to high intensity of service, high risk for further deterioration and high frequency of surveillance required.Oswald Hillock   [facility] If 7PM-7AM, please contact night-coverage at [email or web address], Office  [phone]   12/03/2019, 2:48 PM  LOS: 8 days

## 2019-12-03 NOTE — Progress Notes (Signed)
TR band removed. Site level 0. Pressure dressing applied. Patient educated on site care., Will continue to monitor.

## 2019-12-03 NOTE — Plan of Care (Signed)
  Problem: Education: Goal: Knowledge of General Education information will improve Description: Including pain rating scale, medication(s)/side effects and non-pharmacologic comfort measures Outcome: Progressing   Problem: Health Behavior/Discharge Planning: Goal: Ability to manage health-related needs will improve Outcome: Progressing   Problem: Clinical Measurements: Goal: Ability to maintain clinical measurements within normal limits will improve Outcome: Progressing Goal: Will remain free from infection Outcome: Progressing Goal: Diagnostic test results will improve Outcome: Progressing Goal: Respiratory complications will improve Outcome: Progressing Goal: Cardiovascular complication will be avoided Outcome: Progressing   Problem: Activity: Goal: Risk for activity intolerance will decrease Outcome: Progressing   Problem: Nutrition: Goal: Adequate nutrition will be maintained Outcome: Progressing   Problem: Coping: Goal: Level of anxiety will decrease Outcome: Progressing   Problem: Elimination: Goal: Will not experience complications related to bowel motility Outcome: Progressing   Problem: Pain Managment: Goal: General experience of comfort will improve Outcome: Progressing

## 2019-12-03 NOTE — Progress Notes (Signed)
Patient has arrived back onto the unit from post procedure alert and oriented x 5. In handoff report, patient has 12cc left in the TR band, will begin pulling air within 30 mins. TR band site looks clean, dry and intact. Call bell within reach, will continue to monitor.

## 2019-12-03 NOTE — Interval H&P Note (Signed)
History and Physical Interval Note:  12/03/2019 3:36 PM  Bryan [name]  has presented today for surgery, with the diagnosis of n stemi.  The various methods of treatment have been discussed with the patient and family. After consideration of risks, benefits and other options for treatment, the patient has consented to  Procedure(s): RIGHT/LEFT HEART CATH AND CORONARY ANGIOGRAPHY (N/A) as a surgical intervention.  The patient's history has been reviewed, patient examined, no change in status, stable for surgery.  I have reviewed the patient's chart and labs.  Questions were answered to the patient's satisfaction.    2012 Appropriate Use Criteria for Diagnostic Catheterization Home / Select Test of Interest Indication for RHC Pulmonary Hypertension Pulmonary Hypertension (Right Heart Catheterization) Pulmonary Hypertension  (Right Heart Catheterization) Link Here: https://www.wheeler.[email or web address]/ Indication:  Suspected pulmonary artery hypertension Equivocal or borderline elevated estimated right ventricular systolic pressure on resting echo study A (7) Indication: 97; Score 7  2016 Appropriate Use Criteria for Coronary Revascularization in Patients With Acute Coronary Syndrome NSTEMI/UA Intermediate Risk (TIMI Score 3-4) NSTEMI/Unstable angina, stabilized patient at Intermediate Risk (TIMI Score 3-4) Link Here: [email or web address]-rose.net/ Indication:  Revascularization by PCI or CABG of 1 or more arteries in a patient with NSTEMI or unstable angina with Stabilization after presentation Intermediate risk for clinical events  A (7) Indication: 16; Score 7    [place]

## 2019-12-03 NOTE — Progress Notes (Signed)
Progress Note  Patient Name: Bryan [name] Date of Encounter: 12/03/2019  Attending physician: Oswald Hillock, MD Primary care provider: Rosita Fire, MD Primary Cardiologist:  Consultant:Demosthenes Virnig Terri Skains  Subjective: Bryan [name] is a 64 y.o. male whose past medical history and cardiac risk factors include: Hypertension, oxygen dependent COPD,  non-insulin-dependent diabetes mellitus type 2, coronary calcification. He presented to the hospital with a chief complaint of " chest pain and shortness of breath."  Patient seen and examined at bedside at approximately 8 AM Patient states that he was doing well but is noticing shortness of breath requiring additional oxygen supplement.  Patient states that the chest pain that brought him to the hospital is now resolved.  We have been asked to reevaluate the patient at the request of his attending physician for worsening shortness of breath and additional oxygen supplement more than his baseline. Case discussed and reviewed with his nurse.  Objective: Vital Signs in the last 24 hours: Temp:  [98.5 F (36.9 C)-100.5 F (38.1 C)] 100.5 F (38.1 C) (03/23 0451) Pulse Rate:  [91-117] 107 (03/23 0451) Resp:  [17-19] 18 (03/23 0451) BP: (101-129)/(64-73) 129/71 (03/23 0451) SpO2:  [89 %-96 %] 96 % (03/23 0828) Weight:  [53.5 kg] 53.5 kg (03/23 0442)  Intake/Output from previous day: 03/22 0701 - 03/23 0700 In: 960 [P.O.:960] Out: 3421 [Urine:3420; Stool:1]  Weights:  Filed Weights   12/01/19 0429 12/02/19 0537 12/03/19 0442  Weight: 56.8 kg 56 kg 53.5 kg    Telemetry: Personally reviewed.  Normal sinus rhythm without ectopy  Physical examination: CONSTITUTIONAL: Well-developed and well-nourished. No acute distress.  SKIN: Skin is warm and dry. No rash noted. No cyanosis. No pallor. No jaundice HEAD: Normocephalic and atraumatic.  EYES: No scleral icterus MOUTH/THROAT: Moist oral membranes.  NECK: No JVD present. No thyromegaly noted. No  carotid bruits  LYMPHATIC: No visible cervical adenopathy.  CHEST Normal respiratory effort. No intercostal retractions  LUNGS: Clear to auscultation bilaterally with in the upper lung fields.  Decreased breath sounds at the bases.  No stridor. No wheezes. No rales.  CARDIOVASCULAR: Regular, positive S1-S2, no murmurs rubs or gallops appreciated. ABDOMINAL: Soft, nontender, nondistended, positive bowel sounds in all 4 quadrants, no apparent ascites.  EXTREMITIES: No peripheral edema  HEMATOLOGIC: No significant bruising NEUROLOGIC: Oriented to person, place, and time. Nonfocal. Normal muscle tone.  PSYCHIATRIC: Normal mood and affect. Normal behavior. Cooperative  Lab Results: Hematology Recent Labs  Lab 11/30/19 1840 12/01/19 0431 12/02/19 0444  WBC 10.3 10.6* 10.5  RBC 4.08* 4.03* 4.18*  HGB 11.0* 11.0* 11.4*  HCT 36.2* 35.5* 36.4*  MCV 88.7 88.1 87.1  MCH 27.0 27.3 27.3  MCHC 30.4 31.0 31.3  RDW 18.4* 18.1* 17.8*  PLT 191 227 249    Chemistry Recent Labs  Lab 11/29/19 0357 11/29/19 0357 11/30/19 0312 12/01/19 0431 12/02/19 0444  NA 140   < > 139 139 138  K 3.8   < > 3.5 3.3* 3.5  CL 103   < > 100 98 96*  CO2 26   < > 29 32 33*  GLUCOSE 88   < > 102* 109* 114*  BUN 11   < > 6* <5* <5*  CREATININE 1.43*   < > 1.29* 1.40* 1.28*  CALCIUM 8.4*   < > 8.3* 8.3* 8.1*  PROT 5.4*  --   --   --   --   ALBUMIN 2.3*  --   --   --   --  AST 18  --   --   --   --   ALT 15  --   --   --   --   ALKPHOS 58  --   --   --   --   BILITOT 0.8  --   --   --   --   GFRNONAA 52*   < > 59* 53* 59*  GFRAA 60*   < > >60 >60 >60  ANIONGAP 11   < > _0 < > = values in this interval not displayed.     Hepatic Function Panel  Recent Labs    10/06/19 1007 11/23/19 1728 11/29/19 0357  PROT 7.7 7.0 5.4*  ALBUMIN 3.8 3.4* 2.3*  AST _1 ALT _2 ALKPHOS 92 74 58  BILITOT 0.4 0.5 0.8    Cardiac Enzymes: No results for input(s): CKTOTAL, CKMB, RELINDX in the last  8760 hours.  Invalid input(s): TROPONIN I Lab Results  Component Value Date   PNSQZYT 462 (H) 04/15/2011   CKMB 3.9 04/15/2011   TROPONINI <0.03 01/29/2019     BNP Recent Labs  Lab 12/02/19 1407  BNP 86.5     DDimer No results for input(s): DDIMER in the last 168 hours.   Hemoglobin A1c:  Lab Results  Component Value Date   HGBA1C 6.7 (H) 11/24/2019   MPG 145.59 11/24/2019    TSH No results for input(s): TSH in the last 8760 hours.  Lipid Panel     Component Value Date/Time   CHOL 241 (H) 11/24/2019 1316   TRIG 45 11/24/2019 1316   HDL 86 11/24/2019 1316   CHOLHDL 2.8 11/24/2019 1316   VLDL 9 11/24/2019 1316   LDLCALC 146 (H) 11/24/2019 1316    Imaging: DG Chest Port 1 View  Result Date: 12/02/2019 CLINICAL DATA:  Shortness of breath EXAM: PORTABLE CHEST 1 VIEW COMPARISON:  11/27/2019 FINDINGS: Cardiac shadow is stable. Lungs are well aerated bilaterally. Mild right basilar atelectasis is seen. No bony abnormality is noted. Postsurgical changes in the cervical spine are again noted. IMPRESSION: Mild right basilar atelectasis. Electronically Signed   By: Inez Catalina M.D.   On: 12/02/2019 16:15    Cardiac database: EKG: 11/23/2019 1724: Sinus tachycardia ventricular rate of 107 bpm, right axis deviation, no myocardial injury pattern, wandering baseline in leads II, 3, V5-V6.  Echocardiogram: 06/13/2019: LVEF 60-65%, no LVH, indeterminate diastolic dysfunction, right ventricular size is mildly enlarged, ventricular septal flattening in systole consistent with RV pressure overload, moderately dilated right atrium, mild TR, severe pulmonary hypertension, PASP 70 mmHg.  11/24/2019: LVEF 19-47%, grade 1 diastolic impairment, interventricular septum flattened in systole and diastole consistent with RV pressure and volume overload, moderate pulmonary hypertension, RVSP 60.5 mmHg, moderately dilated right atrium, mild to moderate TR.  Scheduled Meds: . amLODipine  10 mg  Oral Daily  . atorvastatin  40 mg Oral q1800  . enoxaparin (LOVENOX) injection  40 mg Subcutaneous Q24H  . fluticasone furoate-vilanterol  1 puff Inhalation Daily  . gabapentin  300 mg Oral BID  . metoprolol tartrate  12.5 mg Oral BID  . pantoprazole  40 mg Oral BID   Followed by  . [START ON 01/11/2020] pantoprazole  40 mg Oral Daily  . simethicone  80 mg Oral QID  . sodium chloride flush  3 mL Intravenous Q12H  . umeclidinium bromide  1 puff Inhalation Daily    Continuous Infusions: . sodium chloride 10  mL/hr at 12/01/19 1109    PRN Meds: acetaminophen, calcium carbonate, hydrALAZINE, ipratropium-albuterol, loperamide, morphine injection, nitroGLYCERIN, ondansetron (ZOFRAN) IV   IMPRESSION: Non-STEMI secondary to supply demand ischemia from uncontrolled hypertension and noncompliance with oxygen therapy. Shortness of breath: Requiring additional oxygen supplement. Angina pectoris: Resolved Coronary artery calcification, noted on nongated CT: Chronic and stable Hypertension with chronic kidney disease stage III: Improving, currently hypotensive Non-insulin-dependent diabetes mellitus type 2. Mixed hyperlipidemia Chronic kidney disease stage III Oxygen dependent COPD. Former smoker. History of alcohol use. Severe pulmonary hypertension, per echocardiogram.  RECOMMENDATIONS: Bryan [name] is a 64 y.o. male whose past medical history and cardiac risk factors include: Hypertension, oxygen dependent COPD,  non-insulin-dependent diabetes mellitus type 2, coronary calcification.  Non-STEMI, suggestive of supply demand ischemia.  Patient's chest pain is typical for angina pectoris on admission but it was in the setting of uncontrolled hypertension and not using oxygen therapy despite his history of oxygen dependent COPD.  Cardiac biomarkers reviewed.   EKG did not show any signs of underlying injury pattern.  Initially patient requested outpatient stress test; however, now he  refuses and would like to undergo heart catheterization to further define his coronary anatomy disease.  With possible intervention if needed.   Also discussed right heart catheterization to evaluate hemodynamics in the setting of possible pulmonary hypertension.  The left and right heart catheterization procedure was explained to the patient in detail. The indication, alternatives, risks and benefits were reviewed. Complications including but not limited to bleeding, infection, acute kidney injury, blood transfusion, heart rhythm disturbances, contrast (dye) reaction, damage to the arteries or nerves in the legs or hands, cerebrovascular accident, myocardial infarction, need for emergent bypass surgery, blood clots in the legs, possible need for emergent blood transfusion, and rarely death were reviewed and discussed with the patient. The patient voices understanding and wishes to proceed.   Echocardiogram notes a preserved left ventricular systolic function and no significant change compared to prior study.  Initiated low-dose beta-blocker in the setting of tachycardia and coronary artery disease.  Coronary artery calcification: See above  Acute kidney injury on chronic kidney disease: Improving  Dysphagia: Improving  Patient underwent EGD which showed nonbleeding gastric ulcer, multiple nonbleeding ulcers in the duodenum, mild proximal duodenal stenosis without frank obstruction.  Management per primary and GI.   Benign essential hypertension with chronic kidney disease stage III:  Patient presented with uncontrolled hypertension during his admission.  He was started on antihypertensive medications but became hypotensive secondary to watery bowel movements. Now seen by GI service. His antihypertensive medications have been managed by primary team.   Non-insulin-dependent diabetes mellitus type 2:  Hemoglobin A1c greater than 6.5  Currently managed by primary team.  Mixed  hyperlipidemia:  Most recent lipid profile reviewed.  Started on the Lipitor 40 mg p.o. nightly.  Former smoker: Educated on importance of continued smoking cessation.  Oxygen dependent COPD: Currently managed by primary team.  Patient is encouraged in regards to oxygen therapy compliance.  Nursing staff has also been updated during morning rounds.  We will continue to follow the patient with you for now.  Patient's questions and concerns were addressed to his satisfaction. He voices understanding of the instructions provided during this encounter.   This note was created using a voice recognition software as a result there may be grammatical errors inadvertently enclosed that do not reflect the nature of this encounter. Every attempt is made to correct such errors.  Bryan Kras, DO, [facility].  [place] Office: [phone] 12/03/2019, 9:07 AM

## 2019-12-03 NOTE — Interval H&P Note (Signed)
History and Physical Interval Note:  12/03/2019 3:21 PM  Bryan [name]  has presented today for surgery, with the diagnosis of n stemi.  The various methods of treatment have been discussed with the patient and family. After consideration of risks, benefits and other options for treatment, the patient has consented to  Procedure(s): RIGHT/LEFT HEART CATH AND CORONARY ANGIOGRAPHY (N/A) as a surgical intervention.  The patient's history has been reviewed, patient examined, no change in status, stable for surgery.  I have reviewed the patient's chart and labs.  Questions were answered to the patient's satisfaction.     [place]

## 2019-12-03 NOTE — H&P (View-Only) (Signed)
Progress Note  Patient Name: Bryan [name] Date of Encounter: 12/03/2019  Attending physician: Oswald Hillock, MD Primary care provider: Rosita Fire, MD Primary Cardiologist:  Consultant:Felecia Stanfill Terri Skains  Subjective: Bryan [name] is a 64 y.o. male whose past medical history and cardiac risk factors include: Hypertension, oxygen dependent COPD,  non-insulin-dependent diabetes mellitus type 2, coronary calcification. He presented to the hospital with a chief complaint of " chest pain and shortness of breath."  Patient seen and examined at bedside at approximately 8 AM Patient states that he was doing well but is noticing shortness of breath requiring additional oxygen supplement.  Patient states that the chest pain that brought him to the hospital is now resolved.  We have been asked to reevaluate the patient at the request of his attending physician for worsening shortness of breath and additional oxygen supplement more than his baseline. Case discussed and reviewed with his nurse.  Objective: Vital Signs in the last 24 hours: Temp:  [98.5 F (36.9 C)-100.5 F (38.1 C)] 100.5 F (38.1 C) (03/23 0451) Pulse Rate:  [91-117] 107 (03/23 0451) Resp:  [17-19] 18 (03/23 0451) BP: (101-129)/(64-73) 129/71 (03/23 0451) SpO2:  [89 %-96 %] 96 % (03/23 0828) Weight:  [53.5 kg] 53.5 kg (03/23 0442)  Intake/Output from previous day: 03/22 0701 - 03/23 0700 In: 960 [P.O.:960] Out: 3421 [Urine:3420; Stool:1]  Weights:  Filed Weights   12/01/19 0429 12/02/19 0537 12/03/19 0442  Weight: 56.8 kg 56 kg 53.5 kg    Telemetry: Personally reviewed.  Normal sinus rhythm without ectopy  Physical examination: CONSTITUTIONAL: Well-developed and well-nourished. No acute distress.  SKIN: Skin is warm and dry. No rash noted. No cyanosis. No pallor. No jaundice HEAD: Normocephalic and atraumatic.  EYES: No scleral icterus MOUTH/THROAT: Moist oral membranes.  NECK: No JVD present. No thyromegaly noted. No  carotid bruits  LYMPHATIC: No visible cervical adenopathy.  CHEST Normal respiratory effort. No intercostal retractions  LUNGS: Clear to auscultation bilaterally with in the upper lung fields.  Decreased breath sounds at the bases.  No stridor. No wheezes. No rales.  CARDIOVASCULAR: Regular, positive S1-S2, no murmurs rubs or gallops appreciated. ABDOMINAL: Soft, nontender, nondistended, positive bowel sounds in all 4 quadrants, no apparent ascites.  EXTREMITIES: No peripheral edema  HEMATOLOGIC: No significant bruising NEUROLOGIC: Oriented to person, place, and time. Nonfocal. Normal muscle tone.  PSYCHIATRIC: Normal mood and affect. Normal behavior. Cooperative  Lab Results: Hematology Recent Labs  Lab 11/30/19 1840 12/01/19 0431 12/02/19 0444  WBC 10.3 10.6* 10.5  RBC 4.08* 4.03* 4.18*  HGB 11.0* 11.0* 11.4*  HCT 36.2* 35.5* 36.4*  MCV 88.7 88.1 87.1  MCH 27.0 27.3 27.3  MCHC 30.4 31.0 31.3  RDW 18.4* 18.1* 17.8*  PLT 191 227 249    Chemistry Recent Labs  Lab 11/29/19 0357 11/29/19 0357 11/30/19 0312 12/01/19 0431 12/02/19 0444  NA 140   < > 139 139 138  K 3.8   < > 3.5 3.3* 3.5  CL 103   < > 100 98 96*  CO2 26   < > 29 32 33*  GLUCOSE 88   < > 102* 109* 114*  BUN 11   < > 6* <5* <5*  CREATININE 1.43*   < > 1.29* 1.40* 1.28*  CALCIUM 8.4*   < > 8.3* 8.3* 8.1*  PROT 5.4*  --   --   --   --   ALBUMIN 2.3*  --   --   --   --  AST 18  --   --   --   --   ALT 15  --   --   --   --   ALKPHOS 58  --   --   --   --   BILITOT 0.8  --   --   --   --   GFRNONAA 52*   < > 59* 53* 59*  GFRAA 60*   < > >60 >60 >60  ANIONGAP 11   < > _0 < > = values in this interval not displayed.     Hepatic Function Panel  Recent Labs    10/06/19 1007 11/23/19 1728 11/29/19 0357  PROT 7.7 7.0 5.4*  ALBUMIN 3.8 3.4* 2.3*  AST _1 ALT _2 ALKPHOS 92 74 58  BILITOT 0.4 0.5 0.8    Cardiac Enzymes: No results for input(s): CKTOTAL, CKMB, RELINDX in the last  8760 hours.  Invalid input(s): TROPONIN I Lab Results  Component Value Date   PNSQZYT 462 (H) 04/15/2011   CKMB 3.9 04/15/2011   TROPONINI <0.03 01/29/2019     BNP Recent Labs  Lab 12/02/19 1407  BNP 86.5     DDimer No results for input(s): DDIMER in the last 168 hours.   Hemoglobin A1c:  Lab Results  Component Value Date   HGBA1C 6.7 (H) 11/24/2019   MPG 145.59 11/24/2019    TSH No results for input(s): TSH in the last 8760 hours.  Lipid Panel     Component Value Date/Time   CHOL 241 (H) 11/24/2019 1316   TRIG 45 11/24/2019 1316   HDL 86 11/24/2019 1316   CHOLHDL 2.8 11/24/2019 1316   VLDL 9 11/24/2019 1316   LDLCALC 146 (H) 11/24/2019 1316    Imaging: DG Chest Port 1 View  Result Date: 12/02/2019 CLINICAL DATA:  Shortness of breath EXAM: PORTABLE CHEST 1 VIEW COMPARISON:  11/27/2019 FINDINGS: Cardiac shadow is stable. Lungs are well aerated bilaterally. Mild right basilar atelectasis is seen. No bony abnormality is noted. Postsurgical changes in the cervical spine are again noted. IMPRESSION: Mild right basilar atelectasis. Electronically Signed   By: Inez Catalina M.D.   On: 12/02/2019 16:15    Cardiac database: EKG: 11/23/2019 1724: Sinus tachycardia ventricular rate of 107 bpm, right axis deviation, no myocardial injury pattern, wandering baseline in leads II, 3, V5-V6.  Echocardiogram: 06/13/2019: LVEF 60-65%, no LVH, indeterminate diastolic dysfunction, right ventricular size is mildly enlarged, ventricular septal flattening in systole consistent with RV pressure overload, moderately dilated right atrium, mild TR, severe pulmonary hypertension, PASP 70 mmHg.  11/24/2019: LVEF 19-47%, grade 1 diastolic impairment, interventricular septum flattened in systole and diastole consistent with RV pressure and volume overload, moderate pulmonary hypertension, RVSP 60.5 mmHg, moderately dilated right atrium, mild to moderate TR.  Scheduled Meds: . amLODipine  10 mg  Oral Daily  . atorvastatin  40 mg Oral q1800  . enoxaparin (LOVENOX) injection  40 mg Subcutaneous Q24H  . fluticasone furoate-vilanterol  1 puff Inhalation Daily  . gabapentin  300 mg Oral BID  . metoprolol tartrate  12.5 mg Oral BID  . pantoprazole  40 mg Oral BID   Followed by  . [START ON 01/11/2020] pantoprazole  40 mg Oral Daily  . simethicone  80 mg Oral QID  . sodium chloride flush  3 mL Intravenous Q12H  . umeclidinium bromide  1 puff Inhalation Daily    Continuous Infusions: . sodium chloride 10  mL/hr at 12/01/19 1109    PRN Meds: acetaminophen, calcium carbonate, hydrALAZINE, ipratropium-albuterol, loperamide, morphine injection, nitroGLYCERIN, ondansetron (ZOFRAN) IV   IMPRESSION: Non-STEMI secondary to supply demand ischemia from uncontrolled hypertension and noncompliance with oxygen therapy. Shortness of breath: Requiring additional oxygen supplement. Angina pectoris: Resolved Coronary artery calcification, noted on nongated CT: Chronic and stable Hypertension with chronic kidney disease stage III: Improving, currently hypotensive Non-insulin-dependent diabetes mellitus type 2. Mixed hyperlipidemia Chronic kidney disease stage III Oxygen dependent COPD. Former smoker. History of alcohol use. Severe pulmonary hypertension, per echocardiogram.  RECOMMENDATIONS: Bryan [name] is a 64 y.o. male whose past medical history and cardiac risk factors include: Hypertension, oxygen dependent COPD,  non-insulin-dependent diabetes mellitus type 2, coronary calcification.  Non-STEMI, suggestive of supply demand ischemia.  Patient's chest pain is typical for angina pectoris on admission but it was in the setting of uncontrolled hypertension and not using oxygen therapy despite his history of oxygen dependent COPD.  Cardiac biomarkers reviewed.   EKG did not show any signs of underlying injury pattern.  Initially patient requested outpatient stress test; however, now he  refuses and would like to undergo heart catheterization to further define his coronary anatomy disease.  With possible intervention if needed.   Also discussed right heart catheterization to evaluate hemodynamics in the setting of possible pulmonary hypertension.  The left and right heart catheterization procedure was explained to the patient in detail. The indication, alternatives, risks and benefits were reviewed. Complications including but not limited to bleeding, infection, acute kidney injury, blood transfusion, heart rhythm disturbances, contrast (dye) reaction, damage to the arteries or nerves in the legs or hands, cerebrovascular accident, myocardial infarction, need for emergent bypass surgery, blood clots in the legs, possible need for emergent blood transfusion, and rarely death were reviewed and discussed with the patient. The patient voices understanding and wishes to proceed.   Echocardiogram notes a preserved left ventricular systolic function and no significant change compared to prior study.  Initiated low-dose beta-blocker in the setting of tachycardia and coronary artery disease.  Coronary artery calcification: See above  Acute kidney injury on chronic kidney disease: Improving  Dysphagia: Improving  Patient underwent EGD which showed nonbleeding gastric ulcer, multiple nonbleeding ulcers in the duodenum, mild proximal duodenal stenosis without frank obstruction.  Management per primary and GI.   Benign essential hypertension with chronic kidney disease stage III:  Patient presented with uncontrolled hypertension during his admission.  He was started on antihypertensive medications but became hypotensive secondary to watery bowel movements. Now seen by GI service. His antihypertensive medications have been managed by primary team.   Non-insulin-dependent diabetes mellitus type 2:  Hemoglobin A1c greater than 6.5  Currently managed by primary team.  Mixed  hyperlipidemia:  Most recent lipid profile reviewed.  Started on the Lipitor 40 mg p.o. nightly.  Former smoker: Educated on importance of continued smoking cessation.  Oxygen dependent COPD: Currently managed by primary team.  Patient is encouraged in regards to oxygen therapy compliance.  Nursing staff has also been updated during morning rounds.  We will continue to follow the patient with you for now.  Patient's questions and concerns were addressed to his satisfaction. He voices understanding of the instructions provided during this encounter.   This note was created using a voice recognition software as a result there may be grammatical errors inadvertently enclosed that do not reflect the nature of this encounter. Every attempt is made to correct such errors.  Rex Kras, DO, [facility].  [place] Office: [phone] 12/03/2019, 9:07 AM

## 2019-12-03 NOTE — Progress Notes (Signed)
Patient's o2 increased to 5L while asleep due to desaturation. Currently now on 3l. Sat 92.Will continue to monitor.

## 2019-12-03 NOTE — Progress Notes (Signed)
RT NOTES: Patient on 4lpm nasal cannula with sats of 88%. Increased o2 to 6lpm. Sats now 92%. Will continue to monitor.

## 2019-12-04 DIAGNOSIS — I214 Non-ST elevation (NSTEMI) myocardial infarction: Secondary | ICD-10-CM

## 2019-12-04 DIAGNOSIS — I2510 Atherosclerotic heart disease of native coronary artery without angina pectoris: Secondary | ICD-10-CM

## 2019-12-04 LAB — BASIC METABOLIC PANEL
Anion gap: 7 (ref 5–15)
BUN: 5 mg/dL — ABNORMAL LOW (ref 8–23)
CO2: 36 mmol/L — ABNORMAL HIGH (ref 22–32)
Calcium: 8 mg/dL — ABNORMAL LOW (ref 8.9–10.3)
Chloride: 95 mmol/L — ABNORMAL LOW (ref 98–111)
Creatinine, Ser: 1.36 mg/dL — ABNORMAL HIGH (ref 0.61–1.24)
GFR calc Af Amer: >60 mL/min (ref >60)
GFR calc non Af Amer: 55 mL/min — ABNORMAL LOW (ref >60)
Glucose, Bld: 101 mg/dL — ABNORMAL HIGH (ref 70–99)
Potassium: 3.4 mmol/L — ABNORMAL LOW (ref 3.5–5.1)
Sodium: 138 mmol/L (ref 135–145)

## 2019-12-04 MED ORDER — METOPROLOL TARTRATE 25 MG PO TABS: 12.5000 mg | ORAL_TABLET | Freq: Two times a day (BID) | ORAL | 1 refills | Status: AC

## 2019-12-04 MED ORDER — PANTOPRAZOLE SODIUM 40 MG PO TBEC: 40.0000 mg | DELAYED_RELEASE_TABLET | Freq: Two times a day (BID) | ORAL | 0 refills | Status: DC

## 2019-12-04 MED ORDER — ATORVASTATIN CALCIUM 40 MG PO TABS: 40.0000 mg | ORAL_TABLET | Freq: Every day | ORAL | 0 refills | Status: DC

## 2019-12-04 MED ORDER — CALCIUM CARBONATE ANTACID 500 MG PO CHEW: 2.0000 | CHEWABLE_TABLET | Freq: Three times a day (TID) | ORAL | 0 refills | Status: AC | PRN

## 2019-12-04 MED ORDER — AMLODIPINE BESYLATE 10 MG PO TABS: 10.0000 mg | ORAL_TABLET | Freq: Every day | ORAL | 0 refills | Status: DC

## 2019-12-04 MED ORDER — POTASSIUM CHLORIDE CRYS ER 20 MEQ PO TBCR
40.0000 meq | EXTENDED_RELEASE_TABLET | ORAL | Status: AC
  Administered 2019-12-04 (×2): 40 meq via ORAL
  Filled 2019-12-04 (×2): qty 2

## 2019-12-04 NOTE — Progress Notes (Signed)
Went over discharge papers with patient. Patient states he understands he needs to call to make a follow up with PCP. Patient IV and tele monitor have been removed. Transportation has been called, son is on the way. Patient is getting dressed.

## 2019-12-04 NOTE — Progress Notes (Signed)
SATURATION QUALIFICATIONS: (This note is used to comply with regulatory documentation for home oxygen)  Patient Saturations on Room Air at Rest = 82%  Patient Saturations on Room Air while Ambulating = 80%  Patient Saturations on 2 Liters of oxygen while Ambulating = 95%  Please briefly explain why patient needs home oxygen: Patient desats when they is lying in bed if they are not wearing their oxygen. Would need oxygen for home.

## 2019-12-04 NOTE — Progress Notes (Signed)
Progress Note  Patient Name: Bryan [name] Date of Encounter: 12/04/2019  Attending physician: Damita Lack, MD Primary care provider: Rosita Fire, MD Primary Cardiologist:  Consultant:Durinda Buzzelli Terri Skains  Subjective: Bryan [name] is a 64 y.o. male whose past medical history and cardiac risk factors include: Hypertension, oxygen dependent COPD,  non-insulin-dependent diabetes mellitus type 2, coronary calcification. He presented to the hospital with a chief complaint of " chest pain and shortness of breath."  Patient seen and examined at bedside at approximately 750 AM. Resting in bed comfortably.  Patient states that he was doing well post heart cath. No complication postprocedure.   Patient denies any chest pain.  He continues to require supplemental oxygen.    Objective: Vital Signs in the last 24 hours: Temp:  [98.5 F (36.9 C)-99.6 F (37.6 C)] 98.5 F (36.9 C) (03/24 0725) Pulse Rate:  [0-102] 85 (03/24 0725) Resp:  [0-33] 20 (03/24 0725) BP: (91-124)/(55-81) 91/55 (03/24 0725) SpO2:  [0 %-98 %] 90 % (03/24 0725) Weight:  [53.8 kg] 53.8 kg (03/24 0500)  Intake/Output from previous day: 03/23 0701 - 03/24 0700 In: 720 [P.O.:720] Out: 1101 [Urine:1100; Stool:1]  Weights:  Filed Weights   12/02/19 0537 12/03/19 0442 12/04/19 0500  Weight: 56 kg 53.5 kg 53.8 kg    Telemetry: Personally reviewed.  Normal sinus rhythm without ectopy  Physical examination: CONSTITUTIONAL: Well-developed and well-nourished. No acute distress.  SKIN: Skin is warm and dry. No rash noted. No cyanosis. No pallor. No jaundice HEAD: Normocephalic and atraumatic.  EYES: No scleral icterus MOUTH/THROAT: Moist oral membranes.  NECK: No JVD present. No thyromegaly noted. No carotid bruits  LYMPHATIC: No visible cervical adenopathy.  CHEST Normal respiratory effort. No intercostal retractions  LUNGS: Clear to auscultation bilaterally with in the upper lung fields.  Decreased breath sounds at the  bases.  No stridor. No wheezes. No rales.  CARDIOVASCULAR: Regular, positive S1-S2, no murmurs rubs or gallops appreciated. ABDOMINAL: Soft, nontender, nondistended, positive bowel sounds in all 4 quadrants, no apparent ascites.  EXTREMITIES: No peripheral edema  HEMATOLOGIC: No significant bruising NEUROLOGIC: Oriented to person, place, and time. Nonfocal. Normal muscle tone.  PSYCHIATRIC: Normal mood and affect. Normal behavior. Cooperative  Lab Results: Hematology Recent Labs  Lab 11/30/19 1840 11/30/19 1840 12/01/19 0431 12/02/19 0444 12/03/19 1553  WBC 10.3  --  10.6* 10.5  --   RBC 4.08*  --  4.03* 4.18*  --   HGB 11.0*   < > 11.0* 11.4* 11.6*  HCT 36.2*   < > 35.5* 36.4* 34.0*  MCV 88.7  --  88.1 87.1  --   MCH 27.0  --  27.3 27.3  --   MCHC 30.4  --  31.0 31.3  --   RDW 18.4*  --  18.1* 17.8*  --   PLT 191  --  227 249  --    < > = values in this interval not displayed.    Chemistry Recent Labs  Lab 11/29/19 0357 11/30/19 0312 12/02/19 0444 12/02/19 0444 12/03/19 0840 12/03/19 1553 12/04/19 0527  NA 140   < > 138   < > 139 137 138  K 3.8   < > 3.5   < > 3.6 3.3* 3.4*  CL 103   < > 96*  --  90*  --  95*  CO2 26   < > 33*  --  37*  --  36*  GLUCOSE 88   < > 114*  --  123*  --  101*  BUN 11   < > <5*  --  6*  --  5*  CREATININE 1.43*   < > 1.28*  --  1.46*  --  1.36*  CALCIUM 8.4*   < > 8.1*  --  8.8*  --  8.0*  PROT 5.4*  --   --   --   --   --   --   ALBUMIN 2.3*  --   --   --   --   --   --   AST 18  --   --   --   --   --   --   ALT 15  --   --   --   --   --   --   ALKPHOS 58  --   --   --   --   --   --   BILITOT 0.8  --   --   --   --   --   --   GFRNONAA 52*   < > 59*  --  50*  --  55*  GFRAA 60*   < > >60  --  58*  --  >60  ANIONGAP 11   < > 9  --  12  --  7   < > = values in this interval not displayed.     Hepatic Function Panel  Recent Labs    10/06/19 1007 11/23/19 1728 11/29/19 0357  PROT 7.7 7.0 5.4*  ALBUMIN 3.8 3.4* 2.3*  AST _0 ALT _1 ALKPHOS 92 74 58  BILITOT 0.4 0.5 0.8    Cardiac Enzymes: No results for input(s): CKTOTAL, CKMB, RELINDX in the last 8760 hours.  Invalid input(s): TROPONIN I Lab Results  Component Value Date   MVHQION 629 (H) 04/15/2011   CKMB 3.9 04/15/2011   TROPONINI <0.03 01/29/2019     BNP Recent Labs  Lab 12/02/19 1407  BNP 86.5     DDimer No results for input(s): DDIMER in the last 168 hours.   Hemoglobin A1c:  Lab Results  Component Value Date   HGBA1C 6.7 (H) 11/24/2019   MPG 145.59 11/24/2019    TSH No results for input(s): TSH in the last 8760 hours.  Lipid Panel     Component Value Date/Time   CHOL 241 (H) 11/24/2019 1316   TRIG 45 11/24/2019 1316   HDL 86 11/24/2019 1316   CHOLHDL 2.8 11/24/2019 1316   VLDL 9 11/24/2019 1316   LDLCALC 146 (H) 11/24/2019 1316    Imaging: CARDIAC CATHETERIZATION  Result Date: 12/03/2019 LM: Focal distal LM 30% stenosis. LAD: Minimal luminal irregularities. LCx: Normal RCA: Minimal luminal irregularities. Mean PAP 40 mmHg Normal PCW, LVEDP Impression: Mild nonobstructive coronary artery disease Pulmonary hypertension, likely WHO Grp III Manish Esther Hardy, MD [facility] [place] Pager: [phone] Office: [phone]    DG Chest Port 1 View  Result Date: 12/02/2019 CLINICAL DATA:  Shortness of breath EXAM: PORTABLE CHEST 1 VIEW COMPARISON:  11/27/2019 FINDINGS: Cardiac shadow is stable. Lungs are well aerated bilaterally. Mild right basilar atelectasis is seen. No bony abnormality is noted. Postsurgical changes in the cervical spine are again noted. IMPRESSION: Mild right basilar atelectasis. Electronically Signed   By: Inez Catalina M.D.   On: 12/02/2019 16:15    Cardiac database: EKG: 11/23/2019 1724: Sinus tachycardia ventricular rate of 107 bpm, right axis deviation, no myocardial injury pattern, wandering baseline in leads II, 3,  V5-V6.  Echocardiogram: 06/13/2019: LVEF 60-65%, no LVH,  indeterminate diastolic dysfunction, right ventricular size is mildly enlarged, ventricular septal flattening in systole consistent with RV pressure overload, moderately dilated right atrium, mild TR, severe pulmonary hypertension, PASP 70 mmHg.  11/24/2019: LVEF 72-53%, grade 1 diastolic impairment, interventricular septum flattened in systole and diastole consistent with RV pressure and volume overload, moderate pulmonary hypertension, RVSP 60.5 mmHg, moderately dilated right atrium, mild to moderate TR.  Left and right heart catheterization 12/03/2019: LM: Focal distal LM 30% stenosis. LAD: Minimal luminal irregularities. LCx: Normal RCA: Minimal luminal irregularities. Mean PAP 40 mmHg Normal PCW, LVEDP  Impression: Mild nonobstructive coronary artery disease. Pulmonary hypertension, likely WHO Grp III  Scheduled Meds: . amLODipine  10 mg Oral Daily  . atorvastatin  40 mg Oral q1800  . enoxaparin (LOVENOX) injection  40 mg Subcutaneous Q24H  . fluticasone furoate-vilanterol  1 puff Inhalation Daily  . gabapentin  300 mg Oral BID  . metoprolol tartrate  12.5 mg Oral BID  . pantoprazole  40 mg Oral BID   Followed by  . [START ON 01/11/2020] pantoprazole  40 mg Oral Daily  . potassium chloride  40 mEq Oral Q4H  . simethicone  80 mg Oral QID  . sodium chloride flush  3 mL Intravenous Q12H  . sodium chloride flush  3 mL Intravenous Q12H  . umeclidinium bromide  1 puff Inhalation Daily    Continuous Infusions: . sodium chloride 10 mL/hr at 12/01/19 1109  . sodium chloride      PRN Meds: sodium chloride, acetaminophen, calcium carbonate, hydrALAZINE, ipratropium-albuterol, loperamide, morphine injection, nitroGLYCERIN, ondansetron (ZOFRAN) IV, sodium chloride flush   IMPRESSION: Non-STEMI secondary to supply demand ischemia from uncontrolled hypertension and noncompliance with oxygen therapy. Shortness of breath: Requiring additional oxygen supplement. Angina pectoris:  Resolved Nonobstructive coronary artery disease Coronary artery calcification Pulmonary hypertension, mean PA pressure 40 mmHg on right heart catheterization likely WHO Grp III Hypertension with chronic kidney disease stage III: Improving Non-insulin-dependent diabetes mellitus type 2. Mixed hyperlipidemia Chronic kidney disease stage III Oxygen dependent COPD. Former smoker. History of alcohol use. Severe pulmonary hypertension, per echocardiogram.  RECOMMENDATIONS: Bryan [name] is a 64 y.o. male whose past medical history and cardiac risk factors include: Hypertension, oxygen dependent COPD,  non-insulin-dependent diabetes mellitus type 2, coronary calcification nonobstructive coronary artery disease, WHO group 3 pulmonary hypertension.  Non-STEMI, suggestive of supply demand ischemia.  Patient underwent a left and right heart catheterization yesterday results noted above and reviewed with the patient.  No postprocedure complications noted thus far.  Recommend aspirin 81 mg p.o. daily if okay per GI service  High intensity statin therapy  Continue Lopressor.  Restart ARB/ACE inhibitors once kidney function back to baseline.  Blood pressure management per primary team  Continue risk factor modifications for his modifiable cardiovascular risk factors.  Nonobstructive coronary artery disease: See above  Acute kidney injury on chronic kidney disease: Improving  Dysphagia:   Patient underwent EGD which showed nonbleeding gastric ulcer, multiple nonbleeding ulcers in the duodenum, mild proximal duodenal stenosis without frank obstruction.  Management per primary and GI.   Benign essential hypertension with chronic kidney disease stage III:  Patient presented with uncontrolled hypertension during his admission.  He was started on antihypertensive medications but became hypotensive secondary to watery bowel movements. Now seen by GI service. His antihypertensive medications have  been managed by primary team.   Non-insulin-dependent diabetes mellitus type 2:  Hemoglobin A1c greater than 6.5  Currently managed  by primary team.  Mixed hyperlipidemia:  Most recent lipid profile reviewed.  Started on the Lipitor 40 mg p.o. nightly.  During this admission.  Will require outpatient follow-up and titration of medications based on lipid panel.  Recommend 6-week follow-up to assess therapy.  Former smoker: Educated on importance of continued smoking cessation.  Oxygen dependent COPD: Currently managed by primary team.  Patient is encouraged in regards to oxygen therapy compliance.  Outpatient follow-up encouraged.  Will sign off for now call if any questions or concerns arise.  Patient's questions and concerns were addressed to his satisfaction. He voices understanding of the instructions provided during this encounter.   This note was created using a voice recognition software as a result there may be grammatical errors inadvertently enclosed that do not reflect the nature of this encounter. Every attempt is made to correct such errors.  Rex Kras, DO, [facility]. [place] Office: [phone] 12/04/2019, 8:32 AM

## 2019-12-04 NOTE — Discharge Summary (Signed)
Physician Discharge Summary  Bryan [name] TGG:[id number] DOB: 07-08-1956 DOA: 11/23/2019  PCP: Rosita Fire, MD  Admit date: 11/23/2019 Discharge date: 12/04/2019  Admitted From: Home Disposition: Home  Recommendations for Outpatient Follow-up:  1. Follow up with PCP in 1-2 weeks 2. Please obtain BMP/CBC in one week your next doctors visit.  3. Continue PPI twice daily before meals for 6 weeks followed by daily 4. Follow-up outpatient [facility] in 4 to 6 weeks 5. Outpatient repeat lipid levels in about 4 to 6 weeks 6. Continue daily aspirin.   Discharge Condition: Stable CODE STATUS: Full code Diet recommendation: Heart healthy 2 g salt  Brief/Interim Summary:  64 year old with history of HTN, tobacco use, alcohol abuse quit in 2010, HLD admitted for chest pain and shortness of breath.  Transferred from [facility], echo showed preserved EF with moderate pulmonary hypertension started on diuretics underwent LHC/RHC.  CT abdomen pelvis showed esophagitis with possible gastric outlet obstruction.  EGD showed nonbleeding gastric ulcer/multiple nonbleeding ulcers in duodenum, duodenal stenosis without frank obstruction started on PPI twice daily.  Upon admission patient symptoms significantly improved with symptoms and PPI.  On the day of discharge he was saturating well on 2 L nasal cannula even with ambulation.  Cleared by cardiology for discharge.  Follow-up patient recommendations as stated above.  Acute on chronic hypoxic respiratory failure, multifactorial.  At home 2 L nasal cannula, currently on 2 L nasal cannula Pulmonary arterial hypertension, moderate- WHO Grp 3 Acute congestive heart failure with preserved ejection fraction NSTEMI -Status post LHC/RHC-showing nonobstructive CAD, pulmonary hypertension.  Recommending daily aspirin if tolerated and high intensity statin.  Esophagitis with nonbleeding gastric/duodenal ulcers/duodenal stenosis without  obstruction -Continue PPI twice daily x 6 weeks then daily.  Diet as tolerated.  Supportive care -Status post EGD by [facility].  Diarrhea, nonspecific and resolved -CT abdomen pelvis showed enteritis.  On IV Cipro and Flagyl which were discontinued -C. difficile-negative  Acute kidney injury; resolved -Peaked at 2.17.  Now at baseline of 1.3  Diabetes mellitus type 2 -Hemoglobin A1c-6.7. Diet controlled.   Hyperlipidemia -Lipitor; LDL 146  Essential hypertension -On Norvasc and beta-blocker.  Given history of DM2, he should also be on ACE inhibitor/ARB which can be started outpatient by his PCP once his renal function has remained stable  CKD stage III, secondary to HTN and DM 2. -Baseline creatinine around 1.3.  Continue to monitor this.  Discharge Diagnoses:  Principal Problem:   Non-ST elevation (NSTEMI) myocardial infarction [facility]) Active Problems:   H/o Alcohol abuse- Quit in 2010   Benign hypertension with CKD (chronic kidney disease) stage III   CKD (chronic kidney disease) stage 3, GFR 30-59 ml/min   Angina pectoris (HCC)   SOB (shortness of breath)   Oxygen dependent   Former smoker   Coronary artery calcification seen on CAT scan   Non-insulin dependent type 2 diabetes mellitus ([place])   Mixed hyperlipidemia   NSTEMI (non-ST elevated myocardial infarction) ([place])   Pulmonary hypertension due to COPD ([place])   Nonobstructive atherosclerosis of coronary artery    Consultations:  Cardiology  Subjective: Feels great, no complaints.  With ambulation saturating greater than 90% on 2 L nasal cannula which is his home oxygen.  Denies any chest pain or shortness of breath  Discharge Exam: Vitals:   12/04/19 1114 12/04/19 1232  BP: 110/79   Pulse: 92 84  Resp: 16 18  Temp: 99.7 F (37.6 C)   SpO2: 96% 96%   Vitals:  12/04/19 0725 12/04/19 0953 12/04/19 1114 12/04/19 1232  BP: (!) 91/55 110/64 110/79   Pulse: 85 88 92 84  Resp: _0 Temp: 98.5  F (36.9 C)  99.7 F (37.6 C)   TempSrc: Oral  Oral   SpO2: 90% 96% 96% 96%  Weight:      Height:        General: Pt is alert, awake, not in acute distress Cardiovascular: RRR, S1/S2 +, no rubs, no gallops Respiratory: CTA bilaterally, no wheezing, no rhonchi Abdominal: Soft, NT, ND, bowel sounds + Extremities: no edema, no cyanosis  Discharge Instructions   Allergies as of 12/04/2019   No Known Allergies     Medication List    STOP taking these medications   predniSONE 10 MG (21) Tbpk tablet Commonly known as: STERAPRED UNI-PAK 21 TAB     TAKE these medications   acetaminophen 325 MG tablet Commonly known as: TYLENOL Take 2 tablets (650 mg total) by mouth every 6 (six) hours as needed for mild pain (or Fever >/= 101).   albuterol (2.5 MG/3ML) 0.083% nebulizer solution Commonly known as: PROVENTIL Take 3 mLs (2.5 mg total) by nebulization every 4 (four) hours as needed for wheezing or shortness of breath.   albuterol 108 (90 Base) MCG/ACT inhaler Commonly known as: VENTOLIN HFA Inhale 2 puffs into the lungs every 4 (four) hours as needed for wheezing or shortness of breath. Do not use with nebulizer   amLODipine 10 MG tablet Commonly known as: NORVASC Take 1 tablet (10 mg total) by mouth daily. Start taking on: December 05, 2019   aspirin EC 81 MG tablet Take 81 mg by mouth daily.   atorvastatin 40 MG tablet Commonly known as: LIPITOR Take 1 tablet (40 mg total) by mouth daily at 6 PM.   calcium carbonate 500 MG chewable tablet Commonly known as: TUMS - dosed in mg elemental calcium Chew 2 tablets (400 mg of elemental calcium total) by mouth 3 (three) times daily as needed for indigestion or heartburn.   gabapentin 300 MG capsule Commonly known as: NEURONTIN Take 1 capsule (300 mg total) by mouth 2 (two) times daily. Pt. Says he is taking twice daily. What changed: additional instructions   guaiFENesin 600 MG 12 hr tablet Commonly known as: Mucinex Take 1  tablet (600 mg total) by mouth 2 (two) times daily. What changed: when to take this   linaclotide 72 MCG capsule Commonly known as: Linzess Take 1 capsule (72 mcg total) by mouth daily before breakfast.   metoprolol tartrate 25 MG tablet Commonly known as: LOPRESSOR Take 0.5 tablets (12.5 mg total) by mouth 2 (two) times daily.   MiraLax 17 GM/SCOOP powder Generic drug: polyethylene glycol powder Take 17 g by mouth daily as needed (FOR CONSTIPATION).   OXYGEN Inhale 2.5 L into the lungs daily.   pantoprazole 40 MG tablet Commonly known as: PROTONIX Take 1 tablet (40 mg total) by mouth 2 (two) times daily before a meal. What changed: when to take this   Trelegy Ellipta 100-62.5-25 MCG/INH Aepb Generic drug: Fluticasone-Umeclidin-Vilant Inhale 1 puff into the lungs daily.            Durable Medical Equipment  (From admission, onward)         Start     Ordered   11/25/19 1623  For home use only DME Walker rolling  Once    Question Answer Comment  Walker: With 5 Inch Wheels   Patient needs a  walker to treat with the following condition Weakness      11/25/19 1623         Follow-up Information    Rosita Fire, MD. Schedule an appointment as soon as possible for a visit in 1 week(s).   Specialty: Internal Medicine Contact information: [address] [place] [address] [phone]          No Known Allergies  You were cared for by a hospitalist during your hospital stay. If you have any questions about your discharge medications or the care you received while you were in the hospital after you are discharged, you can call the unit and asked to speak with the hospitalist on call if the hospitalist that took care of you is not available. Once you are discharged, your primary care physician will handle any further medical issues. Please note that no refills for any discharge medications will be authorized once you are discharged, as it is imperative  that you return to your primary care physician (or establish a relationship with a primary care physician if you do not have one) for your aftercare needs so that they can reassess your need for medications and monitor your lab values.   Procedures/Studies: CT Angio Chest PE W and/or Wo Contrast  Result Date: 11/23/2019 CLINICAL DATA:  Mid chest pain, shortness of breath and headache x3 days. EXAM: CT ANGIOGRAPHY CHEST WITH CONTRAST TECHNIQUE: Multidetector CT imaging of the chest was performed using the standard protocol during bolus administration of intravenous contrast. Multiplanar CT image reconstructions and MIPs were obtained to evaluate the vascular anatomy. CONTRAST:  172m OMNIPAQUE IOHEXOL 350 MG/ML SOLN COMPARISON:  August 02, 2019 FINDINGS: Cardiovascular: Satisfactory opacification of the pulmonary arteries to the segmental level. No evidence of pulmonary embolism. Normal heart size. No pericardial effusion. Moderate to marked severity coronary artery calcification is seen. This is present on the prior study. Mediastinum/Nodes: No enlarged mediastinal, hilar, or axillary lymph nodes. Thyroid gland, trachea, and esophagus demonstrate no significant findings. Lungs/Pleura: A trace amount of atelectasis is seen within the inferior aspect of the left upper lobe. There is no evidence of acute infiltrate, pleural effusion or pneumothorax. Upper Abdomen: No acute abnormality. Musculoskeletal: A metallic density fusion plate and screws are seen along the anterior aspect of the lower cervical spine. Review of the MIP images confirms the above findings. IMPRESSION: 1. No CT evidence of pulmonary embolism or acute cardiopulmonary disease. 2. Moderate to marked severity coronary artery calcification. 3. Prior cervical spine fusion. Electronically Signed   By: TVirgina NorfolkM.D.   On: 11/23/2019 19:33   CT ABDOMEN PELVIS W CONTRAST  Result Date: 11/27/2019 CLINICAL DATA:  Generalized abdominal pain  and distention. EXAM: CT ABDOMEN AND PELVIS WITH CONTRAST TECHNIQUE: Multidetector CT imaging of the abdomen and pelvis was performed using the standard protocol following bolus administration of intravenous contrast. CONTRAST:  1024mOMNIPAQUE IOHEXOL 300 MG/ML  SOLN COMPARISON:  August 02, 2019. FINDINGS: Lower chest: Visualized lung bases are unremarkable. There is severe wall thickening of the distal esophagus concerning for esophagitis. Hepatobiliary: No focal liver abnormality is seen. No gallstones, gallbladder wall thickening, or biliary dilatation. Pancreas: Unremarkable. No pancreatic ductal dilatation or surrounding inflammatory changes. Spleen: Normal in size without focal abnormality. Adrenals/Urinary Tract: Adrenal glands are unremarkable. Kidneys are normal, without renal calculi, focal lesion, or hydronephrosis. Bladder is unremarkable. Stomach/Bowel: There is severe gastric distention present with severe wall thickening of the proximal duodenum. This may represent severe peptic ulcer disease resulting  in gastric outlet obstruction. Endoscopy is recommended. Fold thickening is seen involving the remaining portion of the duodenum and proximal jejunum concerning for focal enteritis. No colonic dilatation is noted. The appendix is unremarkable. Vascular/Lymphatic: Aortic atherosclerosis. No enlarged abdominal or pelvic lymph nodes. Reproductive: Prostate is unremarkable. Other: No abdominal wall hernia or abnormality. No abdominopelvic ascites. Musculoskeletal: Postsurgical and degenerative changes are noted in the lower lumbar spine. No acute abnormality is noted. IMPRESSION: 1. Severe wall thickening of the distal esophagus is noted concerning for esophagitis. Endoscopy is recommended for further evaluation. 2. Severe gastric distention is noted with severe wall thickening of the proximal duodenum. This may represent severe peptic ulcer disease resulting in gastric outlet obstruction. Fold  thickening is seen involving the remaining portion of the duodenum and proximal jejunum concerning for enteritis. Aortic Atherosclerosis (ICD10-I70.0). Electronically Signed   By: Marijo Conception M.D.   On: 11/27/2019 15:59   CARDIAC CATHETERIZATION  Result Date: 12/03/2019 LM: Focal distal LM 30% stenosis. LAD: Minimal luminal irregularities. LCx: Normal RCA: Minimal luminal irregularities. Mean PAP 40 mmHg Normal PCW, LVEDP Impression: Mild nonobstructive coronary artery disease Pulmonary hypertension, likely WHO Grp III Manish Esther Hardy, MD [facility] [place] Pager: [phone] Office: [phone]    DG Chest Port 1 View  Result Date: 12/02/2019 CLINICAL DATA:  Shortness of breath EXAM: PORTABLE CHEST 1 VIEW COMPARISON:  11/27/2019 FINDINGS: Cardiac shadow is stable. Lungs are well aerated bilaterally. Mild right basilar atelectasis is seen. No bony abnormality is noted. Postsurgical changes in the cervical spine are again noted. IMPRESSION: Mild right basilar atelectasis. Electronically Signed   By: Inez Catalina M.D.   On: 12/02/2019 16:15   DG CHEST PORT 1 VIEW  Result Date: 11/27/2019 CLINICAL DATA:  64 year old male with myocardial infarction. EXAM: PORTABLE CHEST 1 VIEW COMPARISON:  Chest radiograph and CT dated 11/23/2019. FINDINGS: An enteric tube courses below the diaphragm with tip beyond the inferior margin the image. There is no focal consolidation, pleural effusion, or pneumothorax. The cardiac silhouette is within normal limits. Dilated central pulmonary arteries suggestive of a degree of pulmonary hypertension. Clinical correlation is recommended. No acute osseous pathology. Cervical spine fusion hardware. IMPRESSION: 1. No acute cardiopulmonary process. 2. Findings suggestive of pulmonary hypertension. Electronically Signed   By: Anner Crete M.D.   On: 11/27/2019 20:19   DG Chest Portable 1 View  Result Date: 11/23/2019 CLINICAL DATA:  Headache and chest pain.  EXAM: PORTABLE CHEST 1 VIEW COMPARISON:  November 09, 2019 FINDINGS: The lungs are hyperinflated. There is no evidence of acute infiltrate, pleural effusion or pneumothorax. The heart size and mediastinal contours are within normal limits. A radiopaque fusion plate and screws are seen overlying the lower cervical spine. The visualized skeletal structures are otherwise unremarkable. IMPRESSION: No active disease. Electronically Signed   By: Virgina Norfolk M.D.   On: 11/23/2019 18:06   DG Chest Portable 1 View  Result Date: 11/09/2019 CLINICAL DATA:  Shortness of breath.  COPD. EXAM: PORTABLE CHEST 1 VIEW COMPARISON:  Chest x-ray dated 10/06/2019 FINDINGS: Again noted are emphysematous changes bilaterally. There is no pneumothorax. No large pleural effusion. No focal infiltrate. There is no acute osseous abnormality. The heart size is stable from prior study. IMPRESSION: 1. No acute cardiopulmonary process. 2. Again noted are findings consistent with COPD. Electronically Signed   By: Constance Holster M.D.   On: 11/09/2019 18:33   DG Abd 2 Views  Result Date: 11/29/2019 CLINICAL DATA:  Gastric outlet obstruction. EXAM: ABDOMEN -  2 VIEW COMPARISON:  CT scan 11/27/2019 FINDINGS: Upright film shows no evidence for intraperitoneal free air. Visualized lungs appear hyperexpanded. Interstitial markings are diffusely coarsened with chronic features. NG tube tip is in the region of the pylorus and transpyloric placement cannot be excluded. There is diffuse mild distention of colon with contrast material visible in the colonic lumen. Lumbar spine fixation hardware evident. IMPRESSION: 1. NG tube tip position in the region of the pylorus and transpyloric placement cannot be excluded. 2. Nonspecific bowel gas pattern. Electronically Signed   By: Misty Stanley M.D.   On: 11/29/2019 09:06   ECHOCARDIOGRAM COMPLETE  Result Date: 11/24/2019    ECHOCARDIOGRAM REPORT   Patient Name:   ELIO HADEN Date of Exam:  11/24/2019 Medical Rec #:  [id number]   Height:       63.0 in Accession #:    [id number]  Weight:       122.6 lb Date of Birth:  Dec 17, 1955   BSA:          1.571 m Patient Age:    64 years    BP:           153/90 mmHg Patient Gender: M           HR:           100 bpm. Exam Location:  Inpatient Procedure: 2D Echo, Cardiac Doppler and Color Doppler Indications:    NSTEMI  History:        Patient has prior history of Echocardiogram examinations, most                 recent 06/13/2019. Signs/Symptoms:Chest Pain and Shortness of                 Breath; Risk Factors:Hypertension.  Sonographer:    Dustin Flock Referring Phys: [id number] [place]  1. Left ventricular ejection fraction, by estimation, is 55 to 60%. The left ventricle has normal function. The left ventricle has no regional wall motion abnormalities. Left ventricular diastolic parameters are consistent with Grade I diastolic dysfunction (impaired relaxation). There is the interventricular septum is flattened in systole and diastole, consistent with right ventricular pressure and volume overload.  2. Right ventricular systolic function is low normal. The right ventricular size is mildly enlarged. mildly increased right ventricular wall thickness. There is moderately elevated pulmonary artery systolic pressure. The estimated right ventricular systolic pressure is 67.3 mmHg.  3. Right atrial size was mild to moderately dilated.  4. The mitral valve is grossly normal. Trivial mitral valve regurgitation.  5. Tricuspid valve regurgitation is mild to moderate.  6. The aortic valve is tricuspid. Aortic valve regurgitation is not visualized.  7. The inferior vena cava is normal in size with greater than 50% respiratory variability, suggesting right atrial pressure of 3 mmHg. Comparison(s): Changes from prior study are noted. 06/13/2019 - LVEF 60-65%, severe pulmonary hypertension - RVSP 70 mmHg. FINDINGS  Left Ventricle: Left ventricular ejection fraction,  by estimation, is 55 to 60%. The left ventricle has normal function. The left ventricle has no regional wall motion abnormalities. The left ventricular internal cavity size was normal in size. There is  no left ventricular hypertrophy. The interventricular septum is flattened in systole and diastole, consistent with right ventricular pressure and volume overload. Left ventricular diastolic parameters are consistent with Grade I diastolic dysfunction (impaired relaxation). Indeterminate filling pressures. Right Ventricle: The right ventricular size is mildly enlarged. Mildly increased right ventricular wall thickness. Right ventricular systolic function  is low normal. There is moderately elevated pulmonary artery systolic pressure. The tricuspid regurgitant velocity is 3.79 m/s, and with an assumed right atrial pressure of 3 mmHg, the estimated right ventricular systolic pressure is 31.5 mmHg. Left Atrium: Left atrial size was normal in size. Right Atrium: Right atrial size was mild to moderately dilated. Pericardium: There is no evidence of pericardial effusion. Mitral Valve: The mitral valve is grossly normal. Trivial mitral valve regurgitation. Tricuspid Valve: The tricuspid valve is grossly normal. Tricuspid valve regurgitation is mild to moderate. Aortic Valve: The aortic valve is tricuspid. Aortic valve regurgitation is not visualized. Pulmonic Valve: The pulmonic valve was grossly normal. Pulmonic valve regurgitation is not visualized. Aorta: The aortic root, ascending aorta, aortic arch and descending aorta are all structurally normal, with no evidence of dilitation or obstruction. Venous: The inferior vena cava is normal in size with greater than 50% respiratory variability, suggesting right atrial pressure of 3 mmHg. IAS/Shunts: No atrial level shunt detected by color flow Doppler.  LEFT VENTRICLE PLAX 2D LVIDd:         3.90 cm  Diastology LVIDs:         2.70 cm  LV e' lateral:   5.22 cm/s LV PW:          1.00 cm  LV E/e' lateral: 12.0 LV IVS:        0.90 cm  LV e' medial:    6.20 cm/s LVOT diam:     1.60 cm  LV E/e' medial:  10.1 LV SV:         36 LV SV Index:   23 LVOT Area:     2.01 cm  RIGHT VENTRICLE RV Basal diam:  2.80 cm RV S prime:     12.70 cm/s TAPSE (M-mode): 2.0 cm LEFT ATRIUM             Index       RIGHT ATRIUM           Index LA diam:        2.20 cm 1.40 cm/m  RA Area:     17.10 cm LA Vol (A2C):   21.1 ml 13.43 ml/m RA Volume:   54.90 ml  34.96 ml/m LA Vol (A4C):   30.1 ml 19.17 ml/m LA Biplane Vol: 27.1 ml 17.26 ml/m  AORTIC VALVE LVOT Vmax:   103.00 cm/s LVOT Vmean:  66.300 cm/s LVOT VTI:    0.180 m  AORTA Ao Root diam: 2.70 cm MITRAL VALVE               TRICUSPID VALVE MV Area (PHT): 5.31 cm    TR Peak grad:   57.5 mmHg MV Decel Time: 143 msec    TR Vmax:        379.00 cm/s MV E velocity: 62.60 cm/s MV A velocity: 82.30 cm/s  SHUNTS MV E/A ratio:  0.76        Systemic VTI:  0.18 m                            Systemic Diam: 1.60 cm Lyman Bishop MD Electronically signed by Lyman Bishop MD Signature Date/Time: 11/24/2019/1:25:19 PM    Final       The results of significant diagnostics from this hospitalization (including imaging, microbiology, ancillary and laboratory) are listed below for reference.     Microbiology: Recent Results (from the past 240 hour(s))  GI pathogen panel by PCR, stool  Status: None   Collection Time: 11/25/19 10:50 AM   Specimen: Stool  Result Value Ref Range Status   Plesiomonas shigelloides NOT DETECTED NOT DETECTED Final   Yersinia enterocolitica NOT DETECTED NOT DETECTED Final   Vibrio NOT DETECTED NOT DETECTED Final   Enteropathogenic E coli NOT DETECTED NOT DETECTED Final   E coli (ETEC) LT/ST NOT DETECTED NOT DETECTED Final   E coli 1884 by PCR Not applicable NOT DETECTED Final   Cryptosporidium by PCR NOT DETECTED NOT DETECTED Final   Entamoeba histolytica NOT DETECTED NOT DETECTED Final   Adenovirus F 40/41 NOT DETECTED NOT DETECTED  Final   Norovirus GI/GII NOT DETECTED NOT DETECTED Final   Sapovirus NOT DETECTED NOT DETECTED Final    Comment: (NOTE) Performed At: [facility] [place], [place] [id number] Rush Farmer MD WF:[id number]    Vibrio cholerae NOT DETECTED NOT DETECTED Final   Campylobacter by PCR NOT DETECTED NOT DETECTED Final   Salmonella by PCR NOT DETECTED NOT DETECTED Final   E coli (STEC) NOT DETECTED NOT DETECTED Final   Enteroaggregative E coli NOT DETECTED NOT DETECTED Final   Shigella by PCR NOT DETECTED NOT DETECTED Final   Cyclospora cayetanensis NOT DETECTED NOT DETECTED Final   Astrovirus NOT DETECTED NOT DETECTED Final   G lamblia by PCR NOT DETECTED NOT DETECTED Final   Rotavirus A by PCR NOT DETECTED NOT DETECTED Final  C difficile quick scan w PCR reflex     Status: None   Collection Time: 11/25/19 10:50 AM   Specimen: Stool  Result Value Ref Range Status   C Diff antigen NEGATIVE NEGATIVE Final   C Diff toxin NEGATIVE NEGATIVE Final   C Diff interpretation No C. difficile detected.  Final    Comment: Performed at [facility], [place] [address] [place], [place] [address]     Labs: BNP (last 3 results) Recent Labs    11/09/19 1823 11/23/19 1728 12/02/19 1407  BNP 85.0 105.0* 27.0   Basic Metabolic Panel: Recent Labs  Lab 11/30/19 0312 11/30/19 0312 12/01/19 0431 12/02/19 0444 12/03/19 0840 12/03/19 1553 12/04/19 0527  NA 139   < > 139 138 139 137 138  K 3.5   < > 3.3* 3.5 3.6 3.3* 3.4*  CL 100  --  98 96* 90*  --  95*  CO2 29  --  32 33* 37*  --  36*  GLUCOSE 102*  --  109* 114* 123*  --  101*  BUN 6*  --  <5* <5* 6*  --  5*  CREATININE 1.29*  --  1.40* 1.28* 1.46*  --  1.36*  CALCIUM 8.3*  --  8.3* 8.1* 8.8*  --  8.0*   < > = values in this interval not displayed.   Liver Function Tests: Recent Labs  Lab 11/29/19 0357  AST 18  ALT 15  ALKPHOS 58  BILITOT 0.8  PROT 5.4*  ALBUMIN 2.3*   No results for input(s): LIPASE,  AMYLASE in the last 168 hours. No results for input(s): AMMONIA in the last 168 hours. CBC: Recent Labs  Lab 11/29/19 0357 11/29/19 0357 11/30/19 0312 11/30/19 1840 12/01/19 0431 12/02/19 0444 12/03/19 1553  WBC 15.4*  --  12.3* 10.3 10.6* 10.5  --   HGB 12.1*   < > 11.5* 11.0* 11.0* 11.4* 11.6*  HCT 39.4   < > 37.4* 36.2* 35.5* 36.4* 34.0*  MCV 88.5  --  88.6 88.7 88.1 87.1  --  PLT 187  --  188 191 227 249  --    < > = values in this interval not displayed.   Cardiac Enzymes: No results for input(s): CKTOTAL, CKMB, CKMBINDEX, TROPONINI in the last 168 hours. BNP: Invalid input(s): POCBNP CBG: No results for input(s): GLUCAP in the last 168 hours. D-Dimer No results for input(s): DDIMER in the last 72 hours. Hgb A1c No results for input(s): HGBA1C in the last 72 hours. Lipid Profile No results for input(s): CHOL, HDL, LDLCALC, TRIG, CHOLHDL, LDLDIRECT in the last 72 hours. Thyroid function studies No results for input(s): TSH, T4TOTAL, T3FREE, THYROIDAB in the last 72 hours.  Invalid input(s): FREET3 Anemia work up No results for input(s): VITAMINB12, FOLATE, FERRITIN, TIBC, IRON, RETICCTPCT in the last 72 hours. Urinalysis    Component Value Date/Time   COLORURINE YELLOW 11/09/2019 1829   APPEARANCEUR CLEAR 11/09/2019 1829   LABSPEC 1.019 11/09/2019 1829   PHURINE 6.0 11/09/2019 1829   GLUCOSEU NEGATIVE 11/09/2019 1829   HGBUR NEGATIVE 11/09/2019 1829   BILIRUBINUR NEGATIVE 11/09/2019 1829   KETONESUR NEGATIVE 11/09/2019 1829   PROTEINUR 100 (A) 11/09/2019 1829   UROBILINOGEN 0.2 07/08/2015 1314   NITRITE NEGATIVE 11/09/2019 1829   LEUKOCYTESUR NEGATIVE 11/09/2019 1829   Sepsis Labs Invalid input(s): PROCALCITONIN,  WBC,  LACTICIDVEN Microbiology Recent Results (from the past 240 hour(s))  GI pathogen panel by PCR, stool     Status: None   Collection Time: 11/25/19 10:50 AM   Specimen: Stool  Result Value Ref Range Status   Plesiomonas shigelloides NOT  DETECTED NOT DETECTED Final   Yersinia enterocolitica NOT DETECTED NOT DETECTED Final   Vibrio NOT DETECTED NOT DETECTED Final   Enteropathogenic E coli NOT DETECTED NOT DETECTED Final   E coli (ETEC) LT/ST NOT DETECTED NOT DETECTED Final   E coli 8657 by PCR Not applicable NOT DETECTED Final   Cryptosporidium by PCR NOT DETECTED NOT DETECTED Final   Entamoeba histolytica NOT DETECTED NOT DETECTED Final   Adenovirus F 40/41 NOT DETECTED NOT DETECTED Final   Norovirus GI/GII NOT DETECTED NOT DETECTED Final   Sapovirus NOT DETECTED NOT DETECTED Final    Comment: (NOTE) Performed At: [facility] [place], [place] [id number] Rush Farmer MD WU:[id number]    Vibrio cholerae NOT DETECTED NOT DETECTED Final   Campylobacter by PCR NOT DETECTED NOT DETECTED Final   Salmonella by PCR NOT DETECTED NOT DETECTED Final   E coli (STEC) NOT DETECTED NOT DETECTED Final   Enteroaggregative E coli NOT DETECTED NOT DETECTED Final   Shigella by PCR NOT DETECTED NOT DETECTED Final   Cyclospora cayetanensis NOT DETECTED NOT DETECTED Final   Astrovirus NOT DETECTED NOT DETECTED Final   G lamblia by PCR NOT DETECTED NOT DETECTED Final   Rotavirus A by PCR NOT DETECTED NOT DETECTED Final  C difficile quick scan w PCR reflex     Status: None   Collection Time: 11/25/19 10:50 AM   Specimen: Stool  Result Value Ref Range Status   C Diff antigen NEGATIVE NEGATIVE Final   C Diff toxin NEGATIVE NEGATIVE Final   C Diff interpretation No C. difficile detected.  Final    Comment: Performed at [place] [facility], [place] [address], [place], [place] [address]     Time coordinating discharge:  I have spent 35 minutes face to face with the patient and on the ward discussing the patients care, assessment, plan and disposition with other care givers. >50% of the  time was devoted counseling the patient about the risks and benefits of treatment/Discharge disposition and coordinating care.    SIGNED:   Damita Lack, MD  [facility] 12/04/2019, 1:19 PM   If 7PM-7AM, please contact night-coverage

## 2019-12-05 ENCOUNTER — Other Ambulatory Visit: Payer: Self-pay | Admitting: *Deleted

## 2019-12-05 ENCOUNTER — Other Ambulatory Visit: Payer: Self-pay

## 2019-12-05 NOTE — Patient Outreach (Signed)
[place] [facility]) Care Management  12/05/2019  Bryan [name] 03-22-1956 [id number]   [facility] Transition of Care Referral  week 1 Referral Date: 12/04/2019 Referral Source: [place] hospital liaison, Eliott Nine he has had a few events the past couple of days with oxygen needs. He was a NSTEMI; MD notes states he was back to 02 at 2 L today. No home health was ordered.   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   Referral [place] UM Referral Reason:Medium referral (within 10 business day outreach) - [facility], phone [phone]** Unsure if member is still active with [facility], Mentions that he needs assistance finding an affordable place with central air and heat, He lives in a house with a fireplace and is unable to use the oil heat due to his oxygen, Is there a list that member can find or a list that can be mailed to member? Discipline requested [place] LSW Insurance:united healthcare medicare    Outreach attempt initially not successful and THN RN CM was not able to leave a voice message.  Bryan [name] returned a call back to [facility] RN CM shortly after the call attempt Patient is able to verify Patient is able to verify HIPAA ([place]) identifiers, date of birth (DOB) and address Reviewed and addressed Transitional of care referral with patient  Consent: [facility] ([place]) RN CM reviewed [facility] services with patient. Patient gave verbal consent for  services.  Transition of care services  Bryan [name] reports he continues to feel weak "it goes and comes" He reports being on 2.5 L of oxygen and reports he was on the same value before this recent admission [facility] RN CM reviewed his discharge instructions in detail with him Bryan [name] reports he had seen his primary care provider (PCP) on 11/22/19 before his admission on 11/23/19 states he will call to schedule Follow up (f/u) appointment  He reports he needs to renew his inhaler He is not weighing at home  [facility] RN CM discussed the importance of weighing to monitor CHF, follow CHF action plan and notifying the MD of worsening symptoms He voiced understanding He does not have a pulmonologist as Dr Luan Pulling retired   Housing Bryan [name] continues to live in his aunt's home that is now belonging to his cousin but "now it is in my cousin's husband name"He reports he does better in the home during the spring and summer than he does during the fall and winter related to having to use wood for heating . He reports he would still like to relocate but is not in a hurray because of this. He and [facility] RN CM discussed the housing options in [place]. He confirms he has still not completed and returned forms to the local housing. [facility] RN CM informed him this is a necessity. He states he will look around his home for the forms.  He inquires about having the forms resent to him.  THN RN CM informed him the [facility] RN CM would speak with the [facility] SW to see how he will be  able to get the forms from his local housing department. He was encourage to help the housing department to help him by following up with the forms.  [facility] SW services were completed on 11/08/19 and he has been offered resources by 2 [facility] SWs He reports he prefers not to live in an apartmentTHN RN CM encouraged him to consider a lower level apartment related to long waiting lists for homes on the housing list.  On 12/03/19 [facility] CMA contacted [facility] RN CM to inquire  about a letter that was received in the [facility] office about condemnation of the home he is living in. THN RN CM referred THN CMA to the [facility] SW  When [facility] RN CM asked Bryan [name] about him receiving a letter and the home possibly being condemned, he denied receiving a letter and denied he home was condemned  Social:Bryan [name] is a 64 year old disabled male patient who liveswith his son andreceives assist from his son, sisters and brother. 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), acute congestive Heart Failure (CHF), esophagitis with non bleeding gastric/duodenal ulcers/duodenal stenosis without obstruction, nonspecific and resolved diarrhea, acute kidney injury, Diabetes (DM) type 2 (HgA1c 6.7 diet managed), Hyperlipidemia (HLD), Hypertension (HTN), Chronic Kidney disease (CKD), history (hx) of alcohol abuse (quit 2010), oxygen dependent) shortness of breath (sob)    OBS:JGGEZMO walker, oxygen 2.5 L /Min, nebulizer, BP cuff, cbg monitorscales  Appointments: 12/20/19 1015 Dr Jeffie Pollock urology 01/28/20 0930 Gastroenterologist Roseanne Kaufman NP follow up  Advance Directives:Denies need for assist with advance directives   Consent: [facility] RN CM reviewed [facility] services with patient. Patient gave verbal consent for services. Advised patient that other post discharge calls may occur to assess how the patient is doing following the recent hospitalization. Patient voiced understanding and was appreciative of f/u call.  Plan: [facility] RN CM will follow up with Bryan [name] within the next 7-10 business days for further transition of care assessment Pt encouraged to return a call to [facility] RN CM prn Routed note to MD  [facility] CM Care Plan Problem One     Most Recent Value  Care Plan Problem  One  Housing improvement- COPD  Role Documenting the Problem One  Care Management Telephonic Coordinator  Care Plan for Problem One  Active  THN Long Term Goal   over the next 30 days patient will receive resources for improved housing beneficial to managing his COPDt  [facility] Long Term Goal Start Date  10/14/19  [facility] Long Term Goal Met Date  11/13/19  THN CM Short Term Goal #1   over the next 14 days patient will complete an application to the local housing authority  [facility] CM Short Term Goal #1 Start Date  10/14/19  [facility] CM Short Term Goal #1 Met Date  11/13/19    [facility] CM Care Plan Problem Two     Most Recent Value  Care Plan Problem Two  Risk for worsening symptoms of COPD, HTN, DM   Role Documenting the Problem Two  Care Management Telephonic Coordinator  Care Plan for Problem Two  Active  Interventions for Problem Two Long Term Goal   assessed for worsening symptoms, Discussed action plans, weighing  THN Long Term Goal  Over the next 45 days, patient will not be hospitalized for complications related to COPD, DM &  HTN   THN Long Term Goal Start Date  12/05/19  THN CM Short Term Goal #1   over the next 21 days patient will be able to obtain extra oxygen tanks to prevent worsening symptoms of COPD as evidence by verbalizaton on next contact that DME received   THN CM Short Term Goal #1 Start Date  10/29/19  [facility] CM Short Term Goal #1 Met Date   11/13/19  THN CM Short Term Goal #2   over the next 14 days he will have hosptial follow up visits with his primary care provider as verbilized during outreach   [facility] CM Short Term Goal #2 Start Date  12/05/19  Interventions for Short Term Goal #2  assessed for follow up with pcp reviewed discharge instructions, encouraged pcp f/u        Jamesmichael Shadd L. [name], RN, BSN, [place] Management Care Coordinator Direct Number [phone] Mobile number [phone]  Main THN number [phone] Fax number [phone]

## 2019-12-07 ENCOUNTER — Encounter: Payer: Self-pay | Admitting: *Deleted

## 2019-12-12 ENCOUNTER — Other Ambulatory Visit: Payer: Self-pay

## 2019-12-12 ENCOUNTER — Other Ambulatory Visit: Payer: Self-pay | Admitting: *Deleted

## 2019-12-12 NOTE — Patient Outreach (Addendum)
[facility]) Care Management  12/12/2019  THAYER EMBLETON Nov 17, 1955 [id number]   [facility] Transition of Care Referral week 2 Referral Date: 12/04/2019 Referral Source: [place] hospital liaison, Eliott Nine he has had a few events the past couple of days with oxygen needs. He was a NSTEMI; MD notes states he was back to 02 at 2 L today. No home health was ordered.  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  Referral [place] UM Referral Reason:Medium referral (within 10 business day outreach) - [facility], phone [phone]** Unsure if member is still active with [facility], Mentions that he needs assistance finding an affordable place with central air and heat, He lives in a house with a fireplace and is unable to use the oil heat due to his oxygen, Is there a list that member can find or a list that can be mailed to member? Discipline requested [place] LSW Insurance:united healthcare medicare   Outreach successful  Mr Buller reports he was in the line at [place] when [facility] RN CM initially called him [facility] RN CM returned a call to Mr Koegel Patient is able to verify HIPAA ([place] and Accountability Act) identifiers, date of birth (DOB) and address Reviewed purpose of the call to follow up on disease management status and to assess for needs of care coordination and education   Follow up  Mr Sek reports he is now more active  [facility] RN CM discussed being careful with meals from fast  food providers     congestive Heart Failure (CHF)  Now weighing provided weights as 12/12/19 117 lbs,  12/11/19 119 lbs, 12/10/19 119 lbs  COPD Gave him Dr Monico Blitz, [facility] Pulmonologist number [phone]  He wants Portable tanks Uses [employer] for DME. He reports Dr Legrand Rams office is assisting [facility] RN CM reviewed the evaluation that is needed to get portable oxygen THN RN CM discussed his access to use the local [employer] and the local senior center He reports he is familiar with his local senior center   Housing [employer] forms and he turned them in within the last week. He was encouraged to call the housing department every 1-2 weeks for updates   Appointment 12/16/19 Dr Cindy Hazy follow up   [facility] RN CM will follow up with Mr Sheeler within 7-10 business days for further transition of care assessment, care coordination and disease management Pt encouraged to return a call to [facility] RN CM prn Routed note to MD [facility] CM Care Plan Problem One     Most Recent Value  Care Plan Problem One  Housing improvement- COPD  Role Documenting the Problem One  Care Management Telephonic Coordinator  Care Plan for Problem One  Active  [facility] Long Term Goal   over the next 30 days patient will receive resources for improved housing beneficial to managing his COPDt  [facility] Long Term Goal Start Date  10/14/19  [facility] Long Term Goal Met Date  11/13/19  THN CM Short Term Goal #1   over the next 14 days patient will complete an application to the  local housing authority  [facility] CM Short Term Goal #1 Start Date  10/14/19  [facility] CM Short Term Goal #1 Met Date  11/13/19    [facility] CM Care Plan Problem Two     Most Recent Value  Care Plan Problem Two  Risk for worsening symptoms of COPD, HTN, DM   Role Documenting the Problem Two  Care Management Telephonic Coordinator  Care Plan for Problem Two  Active  Interventions for Problem Two Long Term Goal   assessed for worsening symptoms, assessed for pcp follow up, assessed follow  up of completing housing forms, discussed local pulmonlology providers  [facility] Long Term Goal  Over the next 45 days, patient will not be hospitalized for complications related to COPD, DM & HTN   THN Long Term Goal Start Date  12/05/19  THN CM Short Term Goal #1   over the next 21 days patient will be able to obtain extra oxygen tanks to prevent worsening symptoms of COPD as evidence by verbalizaton on next contact that DME received   THN CM Short Term Goal #1 Start Date  10/29/19  [facility] CM Short Term Goal #1 Met Date   11/13/19  THN CM Short Term Goal #2   over the next 14 days he will have hosptial follow up visits with his primary care provider as verbilized during outreach   [facility] CM Short Term Goal #2 Start Date  12/05/19  Interventions for Short Term Goal #2  assessed for appt with pcp or contact to the office encoruaged to attend his appointment  [facility] CM Short Term Goal #3   over the next 21 days patient will be assisted to obtain portable oxygen as evidence of verbalization during future outreach  [facility] CM Short Term Goal #3 Start Date  12/12/19  Interventions for Short Term Goal #3  reviewed the evaluation that is needed to get portable oxygen answered questions      Katerra Ingman L. [name], RN, BSN, [place] Coordinator Office number [phone] Mobile number [phone]  Main THN number [phone] Fax number [phone]

## 2019-12-19 ENCOUNTER — Other Ambulatory Visit: Payer: Self-pay | Admitting: *Deleted

## 2019-12-19 ENCOUNTER — Other Ambulatory Visit: Payer: Self-pay

## 2019-12-19 NOTE — Patient Outreach (Signed)
[place] [facility]) Care Management  12/19/2019  CHAYDEN GARRELTS Nov 12, 1955 [id number]   [facility] Transition of Care Referral week 3 Referral Date:12/04/2019 Referral Source:THN RN CM hospital liaison, Eliott Nine he has had a few events the past couple of days with oxygen needs. He was a NSTEMI; MD notes states he was back to 02 at2 Ltoday. No home health was ordered.  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  Referral [place] [facility] Referral Reason:Medium referral (within 10 business day outreach) - [facility], phone [phone]** Unsure if member is still active with [facility], [place]ions that he needs assistance finding an affordable place with central air and heat, He lives in a house with a fireplace and is unable to use the oil heat due to his oxygen, Is there a list that member can find or a list that can be mailed to member? Discipline requested [facility] LSW Insurance:united healthcare medicare  Mr Dingee returned a call to [facility] RN CM  Patient is able to verify HIPAA ([place] and Accountability Act) identifiers, date of birth (DOB) and address  Transition of care  Mr Pro reports he is doing better this week and has more energy.  He confirms he has not received portable oxygen at this time His weight is 118 lbs  COPD he is breathing better now. He was encouraged to be aware of pollen and to clean his nares frequently. He reports he still has not heard from the local pulmonologist He was  given an in network pulmonologist contact number in [facility], Dr Manuella Ghazi [phone])  NSTEMI- assessed for worsening symptoms and none reported Denies chest pain.  Covid/flu vaccinations Mr Kucinski reports his preference is not to receive the covid nor flu vaccines. Questions answered and encouragement provided  [employer] congestive Heart Failure (CHF) program Mr Feldhaus reports having a scale but no tablet    Housing Mr Marrs reports no updates from the local housing department at this time He was encouraged to continue to follow up on a routine basis now that he has his application completed and turned in.   [facility] RN CM will follow up with Mr Sensabaugh within the next 7-10 business days for further Transition of care assessment Pt encouraged to return a call to [facility] RN CM prn  [facility] CM Care Plan Problem One     Most Recent Value  Care Plan Problem One  Housing improvement- COPD  Role Documenting the Problem One  Care Management Telephonic Coordinator  Care Plan for Problem One  Active  THN Long Term Goal   over the next 30 days patient will receive resources for improved housing beneficial to managing his COPDt  [facility] Long Term Goal Start Date  10/14/19  [facility] Long Term Goal Met Date  11/13/19  THN CM Short Term Goal #1   over the next 14 days patient will complete an application to the local housing authority  [facility] CM Short Term Goal #1 Start Date  10/14/19  [facility] CM Short Term Goal #1 Met Date  11/13/19    [facility] CM Care Plan Problem Two     Most Recent Value  Care Plan Problem Two  Risk for worsening symptoms of COPD, HTN, DM   Role Documenting the Problem Two  Care Management Telephonic Coordinator  Care Plan for Problem Two  Active  Interventions for Problem Two Long Term Goal   assessed for home care managment and worsening symptoms   THN Long Term Goal  Over the next 45 days, patient will not be hospitalized for complications related to COPD, DM & HTN   THN Long Term Goal Start Date   12/05/19  THN CM Short Term Goal #1   over the next 21 days patient will be able to obtain extra oxygen tanks to prevent worsening symptoms of COPD as evidence by verbalizaton on next contact that DME received   THN CM Short Term Goal #1 Start Date  10/29/19  [facility] CM Short Term Goal #1 Met Date   11/13/19  THN CM Short Term Goal #2   over the next 14 days he will have hosptial follow up visits with his primary care provider as verbilized during outreach   [facility] CM Short Term Goal #2 Start Date  12/05/19  Interventions for Short Term Goal #2  assessed for pcp follow up   [facility] CM Short Term Goal #3   over the next 21 days patient will be assisted to obtain portable oxygen as evidence of verbalization during future outreach  [facility] CM Short Term Goal #3 Start Date  12/12/19  Interventions for Short Term Goal #3  assessed for portable oxygen       Kimberly L. [name], RN, BSN, [place] Coordinator Office number [phone] Mobile number [phone]  Main THN number [phone] Fax number [phone]

## 2019-12-19 NOTE — Progress Notes (Signed)
Subjective:  1. BPH with urinary obstruction   2. Urgency of urination   3. Urinary frequency   4. Nocturia       Frequency, Nocturia and Urgency  HPI: Bryan [name] is a 64 year-old male established patient who is here for evaluation of frequency, nocturia or urgency.  4/9/21[name] returns today in f/u.  Orders were placed for PT and PTNS at his visit in October but he decided no to proceed with those options.   He is doing better with reduced daytime frequency and nocturia 2-3x.  He has had no hematuria or dysuria.  He didn't get a UA today.   He was hospitalized with GERD with CP about 2 weeks ago.  He was found to have multiple ulcers on EGD.  He is doing better on PPI's.  His COPD has progressed and he is on O2 continuously now.  UA in February was clear.  Cr on 3/24 was 1.36.  CT AP on 3/17 showed on GU abnormalities.  His IPSS is down to 17 from 29 at his last  Visit.    10/23/20[name] returns today in f/u. He has severe LUTS with OAB and was improving following a TURP in 12/18 but has gotten worse again with an IPSS of 29. he has incontinence with urge. He has nocturia 4-5x. He is having some pelvic pain as well. He has burning with urination but his UA is clear. He had UDS in the fall of 2019 that showed no functional abnormality to explain his symptoms. He remains on tamsulosin. He has failed amitriptyline and oxybutynin.    Bryan [name] held a max capacity of approx. 735 mls (1st filling). His 1st sensation was felt at 389 mls. No instability was noted during the study. He could not void sitting down so he was allowed to stand. He was able to generate a voluntary contraction and void. I had him perform two voids. I wanted to compare voiding pressures to ensure accuracy. His detrusor pressures were not that strong but were well sustained. His flow was a bit interrupted but fairly decent. Final pvr was approx. 50 mls. Very mild trabeculation and some elevation of the bladder base was noted. No  reflux was seen during the filling phase. There are no voiding images-pt stood to void. He held more volume during the study than I thought he would hold. As stated above, no instability was noted and he emptied fairly well. Detrusor pressures were low, and his flow was fairly decent.   GU Hx: Bryan [name] returns today in f/u he had a TURP on 08/17/17 but has had increased LUTS with OAB with some UUI. He was last seen in May and was given Myrbetriq without benefit. He has had Oxybutynin with out benefit.      IPSS    Row Name 12/20/19 1000         International Prostate Symptom Score   How often have you had the sensation of not emptying your bladder?  About half the time     How often have you had to urinate less than every two hours?  About half the time     How often have you found you stopped and started again several times when you urinated?  Less than half the time     How often have you found it difficult to postpone urination?  Less than half the time     How often have you had a weak urinary stream?  Less than half the time     How often have you had to strain to start urination?  Less than half the time     How many times did you typically get up at night to urinate?  3 Times     Total IPSS Score  17       Quality of Life due to urinary symptoms   If you were to spend the rest of your life with your urinary condition just the way it is now how would you feel about that?  Mostly Disatisfied         ROS:  ROS:  A complete review of systems was performed.  All systems are negative except for pertinent findings as noted.   ROS  No Known Allergies  Outpatient Encounter Medications as of 12/20/2019  Medication Sig Note  . acetaminophen (TYLENOL) 325 MG tablet Take 2 tablets (650 mg total) by mouth every 6 (six) hours as needed for mild pain (or Fever >/= 101).   Marland Kitchen albuterol (PROVENTIL) (2.5 MG/3ML) 0.083% nebulizer solution Take 3 mLs (2.5 mg total) by nebulization every 4 (four)  hours as needed for wheezing or shortness of breath.   Marland Kitchen albuterol (VENTOLIN HFA) 108 (90 Base) MCG/ACT inhaler Inhale 2 puffs into the lungs every 4 (four) hours as needed for wheezing or shortness of breath. Do not use with nebulizer   . amLODipine (NORVASC) 10 MG tablet Take 1 tablet (10 mg total) by mouth daily.   Marland Kitchen aspirin EC 81 MG tablet Take 81 mg by mouth daily. 11/23/2019: Pt stopped about 9 days stating that some medications caused stomach pain  . atorvastatin (LIPITOR) 40 MG tablet Take 1 tablet (40 mg total) by mouth daily at 6 PM.   . calcium carbonate (TUMS - DOSED IN MG ELEMENTAL CALCIUM) 500 MG chewable tablet Chew 2 tablets (400 mg of elemental calcium total) by mouth 3 (three) times daily as needed for indigestion or heartburn.   . Fluticasone-Umeclidin-Vilant (TRELEGY ELLIPTA) 100-62.5-25 MCG/INH AEPB Inhale 1 puff into the lungs daily.   Marland Kitchen gabapentin (NEURONTIN) 300 MG capsule Take 1 capsule (300 mg total) by mouth 2 (two) times daily. Pt. Says he is taking twice daily. (Patient taking differently: Take 300 mg by mouth 2 (two) times daily. )   . guaiFENesin (MUCINEX) 600 MG 12 hr tablet Take 1 tablet (600 mg total) by mouth 2 (two) times daily. (Patient taking differently: Take 600 mg by mouth daily. )   . linaclotide (LINZESS) 72 MCG capsule Take 1 capsule (72 mcg total) by mouth daily before breakfast.   . metoprolol tartrate (LOPRESSOR) 25 MG tablet Take 0.5 tablets (12.5 mg total) by mouth 2 (two) times daily.   . OXYGEN Inhale 2.5 L into the lungs daily.   . pantoprazole (PROTONIX) 40 MG tablet Take 1 tablet (40 mg total) by mouth 2 (two) times daily before a meal.   . polyethylene glycol powder (MIRALAX) 17 GM/SCOOP powder Take 17 g by mouth daily as needed (FOR CONSTIPATION).     No facility-administered encounter medications on file as of 12/20/2019.    Past Medical History:  Diagnosis Date  . Acid reflux   . Arthritis   . Asthma   . Chronic pain    with leg and back  pain (disc problem)  . COPD (chronic obstructive pulmonary disease) ([place])   . Diabetes mellitus without complication ([place])   . Headache    HX OF  . History of upper  GI x-ray series    to follow showed large duodenal ulcer H pylori serologies were negative  . Hypertension   . Pneumonia    07/17/17  . Pre-diabetes   . Stroke [facility])    TIA MINI STROKE  . Tubular adenoma     Past Surgical History:  Procedure Laterality Date  . BACK SURGERY  7/05; 5/09    Dr.Hirsch,3 lumbar  X3  . BACK SURGERY    . COLONOSCOPY  Feb 2012   Dr. Gala Romney: normal rectum, pedunculate polyp removed but not recovered  . COLONOSCOPY WITH ESOPHAGOGASTRODUODENOSCOPY (EGD) N/A 11/18/2013   Dr.Rourk- tcs= normal rectum, multipe polyps about the ileocecal valve and distal transverse colon o/w the remainder of the colonic mucosa appeared normal bx= tubular adenoma. EGD= normal esophagus, stomach with scattered erosions mottling, friablility, no ulcer or infiltrating process patent pylorus bx= chronic inflammation. next TCS 11/2016  . COLONOSCOPY WITH PROPOFOL N/A 05/25/2017   Dr. Gala Romney: sigmoid diverticulosis, one 4 mm hyerplastic rectal polyp, ascending colonic AVMs surveillance 2023  . ESOPHAGOGASTRODUODENOSCOPY  1/06   Dr. Volney American esophageal erosions,U-shaped stomach,marked erosions and edema of the bulb without discrete ulcer disease.   . ESOPHAGOGASTRODUODENOSCOPY (EGD) WITH PROPOFOL N/A 05/25/2017   Dr. Gala Romney: reflux esophagitis s/p empiric dilation, normal stomach and duodenum  . ESOPHAGOGASTRODUODENOSCOPY (EGD) WITH PROPOFOL Left 11/30/2019   Procedure: ESOPHAGOGASTRODUODENOSCOPY (EGD) WITH PROPOFOL;  Surgeon: Arta Silence, MD;  Location: [place];  Service: Endoscopy;  Laterality: Left;  Marland Kitchen MALONEY DILATION N/A 05/25/2017   Procedure: Venia Minks DILATION;  Surgeon: Daneil Dolin, MD;  [facility];  Service: Endoscopy;  Laterality: N/A;  . NECK SURGERY  10/2007   PLATE IN NECK  . POLYPECTOMY   05/25/2017   Procedure: POLYPECTOMY;  Surgeon: Daneil Dolin, MD;  [facility];  Service: Endoscopy;;  rectal  . RIGHT/LEFT HEART CATH AND CORONARY ANGIOGRAPHY N/A 12/03/2019   Procedure: RIGHT/LEFT HEART CATH AND CORONARY ANGIOGRAPHY;  Surgeon: Nigel Mormon, MD;  Location: [place] [facility];  Service: Cardiovascular;  Laterality: N/A;  . TRANSURETHRAL RESECTION OF PROSTATE N/A 08/17/2017   Procedure: TRANSURETHRAL RESECTION OF THE PROSTATE (TURP);  Surgeon: Irine Seal, MD;  [facility];  Service: Urology;  Laterality: N/A;    Social History   Socioeconomic History  . Marital status: Single    Spouse name: Not on file  . Number of children: 2  . Years of education: 78  . Highest education level: High school graduate  Occupational History  . Occupation: disabled    Fish farm manager: UNEMPLOYED  Tobacco Use  . Smoking status: Former Smoker    Packs/day: 0.50    Years: 40.00    Pack years: 20.00    Types: Cigarettes    Quit date: 11/11/2014    Years since quitting: 5.1  . Smokeless tobacco: Never Used  Substance and Sexual Activity  . Alcohol use: No    Alcohol/week: 0.0 standard drinks  . Drug use: Yes    Types: Marijuana    Comment: occas  . Sexual activity: Not Currently    Birth control/protection: None  Other Topics Concern  . Not on file  Social History Narrative   Lives with girlfriend and son, is on disability for history of back injuries.   Social Determinants of Health   Financial Resource Strain: Medium Risk  . Difficulty of Paying Living Expenses: Somewhat hard  Food Insecurity: No Food Insecurity  . Worried About Charity fundraiser in the Last Year:  Never true  . Ran Out of Food in the Last Year: Never true  Transportation Needs: No Transportation Needs  . Lack of Transportation (Medical): No  . Lack of Transportation (Non-Medical): No  Physical Activity: Inactive  . Days of Exercise per Week: 0 days  . Minutes of Exercise per Session: 0  min  Stress: No Stress Concern Present  . Feeling of Stress : Not at all  Social Connections: Moderately Isolated  . Frequency of Communication with Friends and Family: More than three times a week  . Frequency of Social Gatherings with Friends and Family: More than three times a week  . Attends Religious Services: Never  . Active Member of Clubs or Organizations: No  . Attends Archivist Meetings: Never  . Marital Status: Never married  Intimate Partner Violence: Not At Risk  . Fear of Current or Ex-Partner: No  . Emotionally Abused: No  . Physically Abused: No  . Sexually Abused: No    Family History  Problem Relation Age of Onset  . Cancer Father   . Asthma Mother   . Colon cancer Neg Hx        Objective: Vitals:   12/20/19 1018  BP: 107/67  Pulse: 65  Temp: (!) 97 F (36.1 C)     Physical Exam  Lab Results:  No results found for this or any previous visit (from the past 24 hour(s)).  BMET No results for input(s): NA, K, CL, CO2, GLUCOSE, BUN, CREATININE, CALCIUM in the last 72 hours. PSA No results found for: PSA No results found for: TESTOSTERONE    Studies/Results:   I have reviewed his recent notes in Epic from his admission in March along with labs, a UA and CT.     Assessment & Plan: History of BPH with BOO with OAB that persisted s/p TURP.  He is doing much better with a significant decline in his symptoms score.  I will just have him return on an as needed basis.    No orders of the defined types were placed in this encounter.    No orders of the defined types were placed in this encounter.     Return if symptoms worsen or fail to improve.   CC: Rosita Fire, MD      Irine Seal 12/20/2019

## 2019-12-19 NOTE — Patient Outreach (Signed)
  [facility]) Care Management  12/19/2019  JABREEL CHIMENTO [birth year] [id number]   [facility] Transition of Care Referral week 3 Referral Date:12/04/2019 Referral Source:THN RN CM hospital liaison, Eliott Nine he has had a few events the past couple of days with oxygen needs. He was a NSTEMI; MD notes states he was back to 02 at2 Ltoday. No home health was ordered.  Date of Admission:11/23/19 Diagnosis:Non ST elevation myocardial infarction (NSTEMI), acute on chronic hypoxic respiratory failure (multifactorial) pulmonary arterial hypertension (moderate-WHO Grp3), acutecongestive Heart Failure (CHF), esophagitis withnon bleedinggastric/duodenal ulcers/duodenal stenosis without obstruction Date of Discharge:12/04/19 [facility]  Mr Mcnealy was initially referred to [facility] on 10/07/19 as an [facility] UM referral for assistance with finding an affordable housing.  Referral [place] UM Referral Reason:Medium referral (within 10 business day outreach) - [facility], phone [phone]** Unsure if member is still active with [facility], Mentions that he needs assistance finding an affordable place with central air and heat, He lives in a house with a fireplace and is unable to use the oil heat due to his oxygen, Is there a list that member can find or a list that can be mailed to member? Discipline requested [facility] LSW Insurance:united healthcare medicare   Outreach successful but incomplete as Mr Bracewell reports he is out completing an errand [facility] RN CM requested a return call from him when he is available  Plans [facility] RN CM will follow up with Mr Vane within 7-10 business days for further transition of care assessment, care coordination and disease management if no return call from him Pt encouraged to return a call to [place] CM prn  Dabney Dever L. [name], RN, BSN, [place] Coordinator Office number [phone] Mobile number [phone]  Main THN number [phone] Fax number [phone]

## 2019-12-20 ENCOUNTER — Ambulatory Visit (INDEPENDENT_AMBULATORY_CARE_PROVIDER_SITE_OTHER): Payer: Medicare Other | Admitting: Urology

## 2019-12-20 VITALS — BP 107/67 | HR 65 | Temp 97.0°F | Ht 63.0 in | Wt 119.0 lb

## 2019-12-20 DIAGNOSIS — R3915 Urgency of urination: Secondary | ICD-10-CM | POA: Diagnosis not present

## 2019-12-20 DIAGNOSIS — N401 Enlarged prostate with lower urinary tract symptoms: Secondary | ICD-10-CM | POA: Diagnosis not present

## 2019-12-20 DIAGNOSIS — R351 Nocturia: Secondary | ICD-10-CM

## 2019-12-20 DIAGNOSIS — R350 Frequency of micturition: Secondary | ICD-10-CM | POA: Diagnosis not present

## 2019-12-20 DIAGNOSIS — N138 Other obstructive and reflux uropathy: Secondary | ICD-10-CM

## 2019-12-26 ENCOUNTER — Encounter: Payer: Self-pay | Admitting: *Deleted

## 2019-12-26 ENCOUNTER — Other Ambulatory Visit: Payer: Self-pay | Admitting: *Deleted

## 2019-12-26 ENCOUNTER — Other Ambulatory Visit: Payer: Self-pay

## 2019-12-26 NOTE — Patient Outreach (Signed)
[facility]) Care Management  12/26/2019  Bryan [name] Mar 03, 1956 [id number]   [facility] Transition of Care Referral week4 Referral Date:12/04/2019 Referral Source:THN RN CM hospital liaison, Eliott Nine he has had a few events the past couple of days with oxygen needs. He was a NSTEMI; MD notes states he was back to 02 at2 Ltoday. No home health was ordered.  Date of Admission:11/23/19 Diagnosis:Non ST elevation myocardial infarction (NSTEMI), acute on chronic hypoxic respiratory failure (multifactorial) pulmonary arterial hypertension (moderate-WHO Grp3), acutecongestive Heart Failure (CHF), esophagitis withnon bleedinggastric/duodenal ulcers/duodenal stenosis without obstruction Date of Discharge:12/04/19 [facility]  Bryan [name] was initially referred to [facility] on 10/07/19 as an [facility] UM referral for assistance with finding an affordable housing.  Referral [place] UM Referral Reason:Medium referral (within 10 business day outreach) - [facility], phone [phone]** Unsure if member is still active with [facility], Mentions that he needs assistance finding an affordable place with central air and heat, He lives in a house with a fireplace and is unable to use the oil heat due to his oxygen, Is there a list that member can find or a list that can be mailed to member? Discipline requested [place] LSW Insurance:united healthcare medicare  Successful outreach to Bryan [name] Patient is able to verify HIPAA ([place] and Accountability Act) identifiers, date of birth (DOB) and address  Transition of care completion He reports just returning home after going out to purchase food  Bryan [name] reports he is doing good this week and has more energy.   Oxygen needs  He confirms he has not received portable oxygen at this time but informed he would be evaluated on 12/31/19  He has small tanks that now last about 2 hours and he reports various  calls this week including today without a return call as he was informed   THN RN CM called [employer] at [phone] and spoke with [name] RN CM discussed with Reggie that Bryan [name] reports various calls this week including today without a return call as he was informed regarding oxygen needs prior to an upcoming evaluation for portable oxygen on 12/31/19 Reggie reports a ticket is note for this oxygen and it appears a possible delivery scheduled for today 12/26/19  Reggie transferred [facility] RN CM to Amy who clarified that with the liter flow below 5 he would need to pick up/switch out his tanks at a local [employer] store vs delivery. Amy reports his closet stores are now in [place] and [place]. Bryan [name] has informed [facility] RN CM he has previous been to [place] to pick up oxygen. This was discussed with Amy. Amy verified his listed number and states she will return a call to him to inform him that his oxygen will need to be picked up vs delivered at a flow liter lower than 5 L /min.  Bryan [name] returned a call to [facility] RN CM to confirm a call from Amy, at [place].He reports he had 2 deliveries of oxygen in the last month from [employer] but was informed oxygen can not be delivered as of " February"  He states he was informed that the [facility] store is not longer available and he would have to go to [place].  He and [facility] RN CM discussed other possible oxygen DME options He request assist with calling [employer] with Kenney Houseman at [facility] at [phone] Bryan Tumminello confirms he is  on 2.5 L/min Tonya to pass along Bryan [name] demographic and insurance information on to the [place] and call Bryan [name] back if they will be able to provide oxygen for him  [facility] RN CM assessed Bryan [name] for oxygen supply and encouraged him to place his tanks up to the concentrator to generate more oxygen THN RN CM spoke with [name] at [place] at [phone] who confirms that [employer] and provides delivery for all amounts of litters She also confirms for portable oxygen the patient would have to get an order from a MD for a portable oxygen evaluation to see if the patient will be able to tolerate portable oxygen   COPD  He has not established care with a pulmonologist at this time He was encouraged to contact Dr Manuella Ghazi  Urology He reports a good visit last week and reports he if voiding better and he is no longer taking medicine. He was informed he would only have to return prn   Housing Bryan [name] reports no updates from the local housing department at this time He was again encouraged to continue to follow up on a routine basis now that he has his application completed and turned in.   Plans [facility] RN CM will follow up with Bryan Beagley for more care coordination and disease management assistance within the next 14 -21 business days  Pt encouraged to return a call to [facility] RN CM prn Routed note to MD  [facility] CM Care Plan Problem One     Most Recent Value  Care Plan Problem One  Housing improvement- COPD  Role Documenting the Problem One  Care Management Telephonic Coordinator  Care Plan for Problem One  Active  THN Long Term Goal   over the next 30 days patient will receive resources for improved housing beneficial to managing his COPDt  [facility] Long Term Goal Start Date  10/14/19  [facility] Long Term Goal Met Date  11/13/19  THN CM Short Term Goal #1   over the next 14 days patient will complete an application to the local housing authority  [facility] CM Short Term Goal #1 Start Date  10/14/19  [facility] CM Short Term Goal #1 Met Date  11/13/19    [facility] CM Care Plan Problem Two     Most Recent Value  Care Plan Problem Two  Risk for worsening symptoms of COPD, HTN, DM   Role Documenting the Problem Two  Care Management Telephonic Coordinator  Care Plan for Problem Two  Active  Interventions for Problem Two Long Term Goal   assessed for worsening COPD,, DM and HTN symptoms  provided encouragement   THN Long Term Goal  Over the next 45 days, patient will not be hospitalized for complications related to COPD, DM & HTN   THN Long Term Goal Start Date  12/05/19  THN CM Short Term Goal #1   over the next 21 days patient will be able to obtain extra oxygen tanks to prevent worsening symptoms of COPD as evidence by verbalizaton on next contact that DME received   THN CM Short Term Goal #1 Start Date  10/29/19  [facility] CM Short Term Goal #1 Met Date   11/13/19  THN CM Short Term Goal #2   over the next 14 days he will have hosptial follow up visits with his primary care provider as verbilized during outreach   [facility] CM Short Term Goal #2 Start Date  12/05/19  Interventions for Short Term Goal #2  assessed for portable ocygen update, outreach to [employer] D[employer] for oxygen needs, outreach to [employer] with pt and [employer]  THN CM Short Term Goal #3   over the next 21 days patient will be assisted to obtain portable oxygen as evidence of verbalization during future outreach  [facility] CM Short Term Goal #3 Start Date  12/12/19  Interventions for Short Term Goal #3  assessed for portable ocygen update, outreach to [employer] for oxygen needs outreach to [employer] with pt and [employer] L. [name], RN, BSN, [place] Coordinator Office number [phone] Mobile number [phone]  Main THN number [phone] Fax number [phone]

## 2020-01-09 ENCOUNTER — Other Ambulatory Visit: Payer: Self-pay | Admitting: *Deleted

## 2020-01-09 NOTE — Patient Outreach (Signed)
[facility]) Care Management  01/09/2020  JEX STRAUSBAUGH [birth year] [id number]   Unsuccessful outreach to [facility] complex care patient  Date of Admission:11/23/19 Diagnosis:Non ST elevation myocardial infarction (NSTEMI), acute on chronic hypoxic respiratory failure (multifactorial) pulmonary arterial hypertension (moderate-WHO Grp3), acutecongestive Heart Failure (CHF), esophagitis withnon bleedinggastric/duodenal ulcers/duodenal stenosis without obstruction Date of Discharge:12/04/19 Facility[facility]  Mr Thune was initially referred to [facility] on 10/07/19 as an [facility] UM referral for assistance with finding an affordable housing.  Referral [place] UM Referral Reason:Medium referral (within 10 business day outreach) - [facility], phone [phone]** Unsure if member is still active with [facility], Mentions that he needs assistance finding an affordable place with central air and heat, He lives in a house with a fireplace and is unable to use the oil heat due to his oxygen, Is there a list that member can find or a list that can be mailed to member? Discipline requested [place] LSW Insurance:united healthcare medicare  Outreach attempt unsuccessful No answer. THN RN CM left HIPAA [facility] Portability and Accountability Act) compliant voicemail message along with CM's contact info.   Plan: [facility] RN CM  scheduled this patient for another call attempt within 4-7  business days   Fynley Chrystal L. [name], RN, BSN, [place] Coordinator Office number [phone] Mobile number [phone]  Main THN number [phone] Fax number [phone]

## 2020-01-15 ENCOUNTER — Other Ambulatory Visit: Payer: Self-pay | Admitting: *Deleted

## 2020-01-15 ENCOUNTER — Other Ambulatory Visit: Payer: Self-pay

## 2020-01-15 NOTE — Patient Outreach (Signed)
[facility]) Care Management  01/15/2020  Bryan [name] 29-Sep-1955 [id number]   outreach to [facility] complex care patient  Date of Admission:11/23/19 Diagnosis:Non ST elevation myocardial infarction (NSTEMI), acute on chronic hypoxic respiratory failure (multifactorial) pulmonary arterial hypertension (moderate-WHO Grp3), acutecongestive Heart Failure (CHF), esophagitis withnon bleedinggastric/duodenal ulcers/duodenal stenosis without obstruction Date of Discharge:12/04/19 Facility:[place] [facility]  Bryan [name] was initially referred to [facility] on 10/07/19 as an [facility] referral for assistance with finding an affordable housing.  Referral [place] [facility] Referral Reason:Medium referral (within 10 business day outreach) - [facility], phone [phone]** Unsure if member is still active with [facility], Mentions that he needs assistance finding an affordable place with central air and heat, He lives in a house with a fireplace and is unable to use the oil heat due to his oxygen, Is there a list that member can find or a list that can be mailed to member? Discipline requested [place] LSW Insurance:united [employer] medicare  Outreach successful to his home mobile number Bryan [name] is able to verify HIPAA ([place] and Accountability Act) identifiers, date of birth (DOB) and address Reviewed purpose of the call with Bryan [name]   Follow up on DME and pulmonology appointment Bryan [name] reports his niece assisted him recently to go to [facility] to get extra oxygen tanks Bryan [name] reported he did receive a response from [facility] about not being able to provide him with oxygen DME services related to a "three year contract" He voiced he did not completely understand the response. [facility] RN CM and Bryan [name] completed a conference call to [employer] and [employer] to discuss his oxygen needs and options Mickel Baas at [employer] informed him of his present services and  discussed all his options He was informed [employer] does not have smaller tanks than the ones he already has nor does most other DME providers, He ws informed it does take 2.5 hours to fill his extra tanks He has a big tank, concentrator and 6 other tanks at home  He confirms with her that he is on 2.5 L/min of oxygen Bryan [name] states he will consider purchasing an extra oxygen tank ($500-600) He and [facility] RN CM discussed the [employer] tank He has seen on commercials  He was encouraged to call the toll free number to see if he could afford to purchase one or one from [employer] instead  Present respiratory issues Bryan [name] reports low oxygen saturations as low as 72% [facility] RN CM assessed him for cold fingers, alternating fingers, sob, congestion, deep breathing etc During this call he checked the saturations and reports values of initially 81% and values that increased to 92% by the end of the outreach   Bryan [name] states he has not had a nebulizer treatment and will complete one today to further assist with improving his respiratory status   Pulmonology appointment Bryan [name] has not seen a pulmonology staff since Dr Luan Pulling left  He and [facility] RN CM completed a conference call to various [facility] listed pulmonologist and [facility] pulmonologist to get assistance to making him an appointment to be seen by [name] at Dr Luan Pulling old [place] office on February 17 2020 at [place]  He was given again the number for [place] housing to check on the status of his application for new housing -  [name]: [facility] RN CM scheduled this patient for another call attempt  within 21-28 business days Pt encouraged to return a call to [facility] RN CM prn Routed note to MD [facility] CM Care Plan Problem One     Most Recent Value  Care Plan Problem One  Housing improvement- COPD  Role Documenting the Problem One  Care Management Telephonic Coordinator  Care Plan for Problem One  Active   [facility] Long Term Goal   over the next 30 days patient will receive resources for improved housing beneficial to managing his COPDt  [facility] Long Term Goal Start Date  10/14/19  [name] Long Term Goal Met Date  11/13/19  [facility] CM Short Term Goal #1   over the next 14 days patient will complete an application to the local housing authority  [facility] CM Short Term Goal #1 Start Date  10/14/19  [facility] CM Short Term Goal #1 Met Date  11/13/19    [facility] CM Care Plan Problem Two     Most Recent Value  Care Plan Problem Two  Risk for worsening symptoms of COPD, HTN, DM   Role Documenting the Problem Two  Care Management Telephonic Coordinator  Care Plan for Problem Two  Active  Interventions for Problem Two Long Term Goal   assessed for worsening symptoms, various calls to DME providers, answered questions about oxygen DME to include [employer], assisted with making a new pulmonology appointment with [name]  [facility] Long Term Goal  Over the next 45 days, patient will not be hospitalized for complications related to COPD, DM & HTN   [facility] Long Term Goal Start Date  12/05/19  [facility] CM Short Term Goal #1   over the next 21 days patient will be able to obtain extra oxygen tanks to prevent worsening symptoms of COPD as evidence by verbalizaton on next contact that DME received   [facility] CM Short Term Goal #1 Start Date  10/29/19  [facility] CM Short Term Goal #1 Met Date   11/13/19  [facility] CM Short Term Goal #2   over the next 14 days he will have hosptial follow up visits with his primary care provider as verbilized during outreach   [facility] CM Short Term Goal #2 Start Date  12/05/19  [facility] CM Short Term Goal #2 Met Date  01/15/20  [facility] CM Short Term Goal #3   over the next 21 days patient will be assisted to obtain portable oxygen as evidence of verbalization during future outreach  [facility] CM Short Term Goal #3 Start Date  12/12/19  Interventions for Short Term Goal #3  assessed for further oxygen DME needs, assessed respiratory status various calls to DME  providers and pulmonologists, assisted to get appt with Dr Olegario Messier L. [name], RN, BSN, [place] Coordinator Office number [phone] Mobile number [phone]  Main [facility] number [id number] Fax number [phone]

## 2020-01-24 ENCOUNTER — Telehealth: Payer: Self-pay | Admitting: Urology

## 2020-01-24 NOTE — Telephone Encounter (Signed)
Patient was seen in April but states he is having more issues and needs appointment soon. He asked that a nurse call him to go over symptoms.

## 2020-01-24 NOTE — Telephone Encounter (Signed)
LMTRC

## 2020-01-28 ENCOUNTER — Other Ambulatory Visit: Payer: Self-pay

## 2020-01-28 ENCOUNTER — Ambulatory Visit (INDEPENDENT_AMBULATORY_CARE_PROVIDER_SITE_OTHER): Payer: Medicare Other | Admitting: Gastroenterology

## 2020-01-28 ENCOUNTER — Encounter: Payer: Self-pay | Admitting: Gastroenterology

## 2020-01-28 VITALS — BP 156/84 | HR 91 | Temp 97.3°F | Ht 63.0 in | Wt 124.4 lb

## 2020-01-28 DIAGNOSIS — K5900 Constipation, unspecified: Secondary | ICD-10-CM

## 2020-01-28 DIAGNOSIS — K219 Gastro-esophageal reflux disease without esophagitis: Secondary | ICD-10-CM

## 2020-01-28 DIAGNOSIS — K279 Peptic ulcer, site unspecified, unspecified as acute or chronic, without hemorrhage or perforation: Secondary | ICD-10-CM

## 2020-01-28 NOTE — Patient Instructions (Signed)
We are arranging an upper endoscopy with Dr. Gala Romney to make sure the ulcer is healed.  Continue to avoid medications such as Ibuprofen, Advil, Aleve, Motrin, Goody powders, BC powders. Just use tylenol products.  Continue Protonix twice a day, 30 minutes before breakfast and dinner.   Continue Linzess daily.  We will see you in 6 months!  I enjoyed seeing you again today! As you know, I value our relationship and want to provide genuine, compassionate, and quality care. I welcome your feedback. If you receive a survey regarding your visit,  I greatly appreciate you taking time to fill this out. See you next time!  [name], PhD, ANP-BC [facility] Gastroenterology

## 2020-01-28 NOTE — Progress Notes (Signed)
Referring Provider: Rosita Fire, MD Primary Care Physician:  Rosita Fire, MD Primary GI: Dr. Gala Romney   Chief Complaint  Patient presents with  . Gastroesophageal Reflux    doing ok    HPI:   LONNY EISEN is a 64 y.o. male presenting today with a history of chronic abdominal pain, constipation, GERD, esophagitis, dysphagia despite empiric dilation in Sept 2018. BPE without motility issues. Abdominal pain thoroughly evaluated with HIDA, multiple CTs. Chronic abdominal pain felt multifactorial in setting of constipation, GERD/gastritis/esophagitis, possible musculoskeletal component.   Inpatient March 2021 in [place] with chest pain. NSTEMI due to supply demand ischemia from uncontrolled HTN.Cardiac cath while inpatient with mild nonobstructive CAD, pulmonary hypertension.  Recommending 81 mg aspirin daily. CT abd/pelvis with contrast March 2021 with severe wall thickening of distal esophagus, severe gastric distension with severe wall thickening of proximal duodenum. EGD while inpatient with Dr. Paulita Fujita with LA Grade D esophagitis, small hiatal hernia, one non-bleeding cratered gastric ulcer in prepyloric region of stomach, many non-bleeding cratered duodenal ulcers without stigmata of bleeding, mild luminal narrowing at apex of duodenal bulb but traversed easily. H.pylori serology negative. States he was taking Ibuprofen last year about one every day. Not taking any longer. Protonix BID.   Oxygen continuous, 2.5 liters nasal cannula. Constipation managed well with Linzess 72 mcg daily. No significant abdominal pain. No dysphagia. No N/V. Good appetite. Doing better now. Pulmonologist June 7th upcoming. Was on oxygen a few years ago but now back on it since hospitalization March 2021. DOE.   Past Medical History:  Diagnosis Date  . Acid reflux   . Arthritis   . Asthma   . Chronic pain    with leg and back pain (disc problem)  . COPD (chronic obstructive pulmonary disease) ([place])    . Diabetes mellitus without complication ([place])   . Headache    HX OF  . History of upper GI x-ray series    to follow showed large duodenal ulcer H pylori serologies were negative  . Hypertension   . NSTEMI (non-ST elevated myocardial infarction) ([place]) 11/2019  . Pneumonia    07/17/17  . Pre-diabetes   . Stroke [facility])    TIA MINI STROKE  . Tubular adenoma     Past Surgical History:  Procedure Laterality Date  . BACK SURGERY  7/05; 5/09    Dr.Hirsch,3 lumbar  X3  . BACK SURGERY    . COLONOSCOPY  Feb 2012   Dr. Gala Romney: normal rectum, pedunculate polyp removed but not recovered  . COLONOSCOPY WITH ESOPHAGOGASTRODUODENOSCOPY (EGD) N/A 11/18/2013   Dr.Rourk- tcs= normal rectum, multipe polyps about the ileocecal valve and distal transverse colon o/w the remainder of the colonic mucosa appeared normal bx= tubular adenoma. EGD= normal esophagus, stomach with scattered erosions mottling, friablility, no ulcer or infiltrating process patent pylorus bx= chronic inflammation. next TCS 11/2016  . COLONOSCOPY WITH PROPOFOL N/A 05/25/2017   Dr. Gala Romney: sigmoid diverticulosis, one 4 mm hyerplastic rectal polyp, ascending colonic AVMs surveillance 2023  . ESOPHAGOGASTRODUODENOSCOPY  1/06   Dr. Volney American esophageal erosions,U-shaped stomach,marked erosions and edema of the bulb without discrete ulcer disease.   . ESOPHAGOGASTRODUODENOSCOPY (EGD) WITH PROPOFOL N/A 05/25/2017   Dr. Gala Romney: reflux esophagitis s/p empiric dilation, normal stomach and duodenum  . ESOPHAGOGASTRODUODENOSCOPY (EGD) WITH PROPOFOL Left 11/30/2019   LA Grade D esophagitis, small hiatal hernia, one non-bleeding cratered gastric ulcer in prepyloric region of stomach, many non-bleeding cratered duodenal ulcers without stigmata of bleeding, mild  luminal narrowing at apex of duodenal bulb but traversed easily. H.pylori serology negative.  Marland Kitchen MALONEY DILATION N/A 05/25/2017   Procedure: Venia Minks DILATION;  Surgeon: Daneil Dolin, MD;   [facility];  Service: Endoscopy;  Laterality: N/A;  . NECK SURGERY  10/2007   PLATE IN NECK  . POLYPECTOMY  05/25/2017   Procedure: POLYPECTOMY;  Surgeon: Daneil Dolin, MD;  Location: [facility];  Service: Endoscopy;;  rectal  . RIGHT/LEFT HEART CATH AND CORONARY ANGIOGRAPHY N/A 12/03/2019   Procedure: RIGHT/LEFT HEART CATH AND CORONARY ANGIOGRAPHY;  Surgeon: Nigel Mormon, MD;  Location: [facility] LAB;  Service: Cardiovascular;  Laterality: N/A;  . TRANSURETHRAL RESECTION OF PROSTATE N/A 08/17/2017   Procedure: TRANSURETHRAL RESECTION OF THE PROSTATE (TURP);  Surgeon: Irine Seal, MD;  Location: [facility] ORS;  Service: Urology;  Laterality: N/A;    Current Outpatient Medications  Medication Sig Dispense Refill  . acetaminophen (TYLENOL) 325 MG tablet Take 2 tablets (650 mg total) by mouth every 6 (six) hours as needed for mild pain (or Fever >/= 101). 12 tablet 0  . albuterol (PROVENTIL) (2.5 MG/3ML) 0.083% nebulizer solution Take 3 mLs (2.5 mg total) by nebulization every 4 (four) hours as needed for wheezing or shortness of breath. 75 mL 5  . albuterol (VENTOLIN HFA) 108 (90 Base) MCG/ACT inhaler Inhale 2 puffs into the lungs every 4 (four) hours as needed for wheezing or shortness of breath. Do not use with nebulizer 18 g 2  . calcium carbonate (TUMS - DOSED IN MG ELEMENTAL CALCIUM) 500 MG chewable tablet Chew 2 tablets (400 mg of elemental calcium total) by mouth 3 (three) times daily as needed for indigestion or heartburn. 30 tablet 0  . Fluticasone-Umeclidin-Vilant (TRELEGY ELLIPTA) 100-62.5-25 MCG/INH AEPB Inhale 1 puff into the lungs daily. 28 each 5  . gabapentin (NEURONTIN) 300 MG capsule Take 1 capsule (300 mg total) by mouth 2 (two) times daily. Pt. Says he is taking twice daily. (Patient taking differently: Take 300 mg by mouth 2 (two) times daily. )    . guaiFENesin (MUCINEX) 600 MG 12 hr tablet Take 1 tablet (600 mg total) by mouth 2 (two) times daily.  (Patient taking differently: Take 600 mg by mouth daily. ) 60 tablet 2  . linaclotide (LINZESS) 72 MCG capsule Take 1 capsule (72 mcg total) by mouth daily before breakfast. 90 capsule 3  . metoprolol tartrate (LOPRESSOR) 25 MG tablet Take 0.5 tablets (12.5 mg total) by mouth 2 (two) times daily. 30 tablet 1  . OXYGEN Inhale 2.5 L into the lungs daily.    . pantoprazole (PROTONIX) 40 MG tablet Take 1 tablet (40 mg total) by mouth 2 (two) times daily before a meal. 90 tablet 0  . polyethylene glycol powder (MIRALAX) 17 GM/SCOOP powder Take 17 g by mouth daily as needed (FOR CONSTIPATION).      No current facility-administered medications for this visit.    Allergies as of 01/28/2020  . (No Known Allergies)    Family History  Problem Relation Age of Onset  . Cancer Father   . Asthma Mother   . Colon cancer Neg Hx     Social History   Socioeconomic History  . Marital status: Single    Spouse name: Not on file  . Number of children: 2  . Years of education: 58  . Highest education level: High school graduate  Occupational History  . Occupation: disabled    Fish farm manager: UNEMPLOYED  Tobacco Use  .  Smoking status: Former Smoker    Packs/day: 0.50    Years: 40.00    Pack years: 20.00    Types: Cigarettes    Quit date: 11/11/2014    Years since quitting: 5.2  . Smokeless tobacco: Never Used  Substance and Sexual Activity  . Alcohol use: No    Alcohol/week: 0.0 standard drinks  . Drug use: Not Currently    Types: Marijuana    Comment: occas; denied 01/28/20  . Sexual activity: Not Currently    Birth control/protection: None  Other Topics Concern  . Not on file  Social History Narrative   Lives with girlfriend and son, is on disability for history of back injuries.   Social Determinants of Health   Financial Resource Strain: Medium Risk  . Difficulty of Paying Living Expenses: Somewhat hard  Food Insecurity: No Food Insecurity  . Worried About Charity fundraiser in the Last  Year: Never true  . Ran Out of Food in the Last Year: Never true  Transportation Needs: No Transportation Needs  . Lack of Transportation (Medical): No  . Lack of Transportation (Non-Medical): No  Physical Activity: Inactive  . Days of Exercise per Week: 0 days  . Minutes of Exercise per Session: 0 min  Stress: No Stress Concern Present  . Feeling of Stress : Not at all  Social Connections: Moderately Isolated  . Frequency of Communication with Friends and Family: More than three times a week  . Frequency of Social Gatherings with Friends and Family: More than three times a week  . Attends Religious Services: Never  . Active Member of Clubs or Organizations: No  . Attends Archivist Meetings: Never  . Marital Status: Never married    Review of Systems: Gen: Denies fever, chills, anorexia. Denies fatigue, weakness, weight loss.  CV: Denies chest pain, palpitations, syncope, peripheral edema, and claudication. Resp: Denies dyspnea at rest, cough, wheezing, coughing up blood, and pleurisy. GI: see HPI Derm: Denies rash, itching, dry skin Psych: Denies depression, anxiety, memory loss, confusion. No homicidal or suicidal ideation.  Heme: Denies bruising, bleeding, and enlarged lymph nodes.  Physical Exam: BP (!) 156/84   Pulse 91   Temp (!) 97.3 F (36.3 C) (Temporal)   Ht _0  (1.6 m)   Wt 124 lb 6.4 oz (56.4 kg)   BMI 22.04 kg/m  General:   Alert and oriented. No distress noted. Pleasant and cooperative.  Head:  Normocephalic and atraumatic. Eyes:  Conjuctiva clear without scleral icterus. Mouth:  Mask in place Lungs: clear bilaterally Cardiac: S1 S2 present without obvious murmurs Abdomen:  +BS, soft, non-tender and non-distended. No rebound or guarding. No HSM or masses noted. Msk:  Symmetrical without gross deformities. Normal posture. Extremities:  Without edema. Neurologic:  Alert and  oriented x4 Psych:  Alert and cooperative. Normal mood and  affect.  ASSESSMENT: LOKI WUTHRICH is a 63 y.o. male presenting today in hospital follow-up for NSTEMI due to demand ischemia in setting of uncontrolled HTN, undergoing cardiac cath with mild nonobstructive CAD, pulmonary hypertension, now back on continuous O2 nasal cannula therapy. CT imaging revealed distal esophageal wall thickening, gastric distension, and wall thickening of proximal duodenum. EGD undertaken March 2021 during admission with LA Grade D esophagitis, one non-bleeding cratered gastric ulcer in preplyoric region, many non-bleeding cratered duodenal ulcers without stigmata of bleeding, and although mild luminal narrowing of duodenal bulb, the scope was able to traverse. Likely due to NSAIDs. H.pylori serology negative.  Needs  surveillance EGD in near future. He is clinically doing well with chronic GI issues at baseline. Continues with Protonix BID and Linzess 72 mcg daily. Pulmonary appt upcoming in next few weeks.     PLAN:   Proceed with upper endoscopy in the near future with Dr. Gala Romney using Propofol. The risks, benefits, and alternatives have been discussed in detail with patient. They have stated understanding and desire to proceed.   Protonix BID  Continue Linzess 72 mcg daily  Return in 6 months  Continue to avoid NSAIDs  [name], PhD, ANP-BC [facility] Gastroenterology

## 2020-02-02 ENCOUNTER — Emergency Department (HOSPITAL_COMMUNITY): Payer: Medicare Other

## 2020-02-02 ENCOUNTER — Emergency Department (HOSPITAL_COMMUNITY)
Admission: EM | Admit: 2020-02-02 | Discharge: 2020-02-02 | Disposition: A | Discharge status: Home / Self Care | Payer: Medicare Other | Attending: Emergency Medicine | Admitting: Emergency Medicine

## 2020-02-02 ENCOUNTER — Other Ambulatory Visit: Payer: Self-pay

## 2020-02-02 ENCOUNTER — Encounter (HOSPITAL_COMMUNITY): Payer: Self-pay | Admitting: Emergency Medicine

## 2020-02-02 DIAGNOSIS — K529 Noninfective gastroenteritis and colitis, unspecified: Secondary | ICD-10-CM | POA: Diagnosis not present

## 2020-02-02 DIAGNOSIS — Z8673 Personal history of transient ischemic attack (TIA), and cerebral infarction without residual deficits: Secondary | ICD-10-CM | POA: Insufficient documentation

## 2020-02-02 DIAGNOSIS — E1122 Type 2 diabetes mellitus with diabetic chronic kidney disease: Secondary | ICD-10-CM | POA: Diagnosis not present

## 2020-02-02 DIAGNOSIS — Z87891 Personal history of nicotine dependence: Secondary | ICD-10-CM | POA: Insufficient documentation

## 2020-02-02 DIAGNOSIS — Z79899 Other long term (current) drug therapy: Secondary | ICD-10-CM | POA: Insufficient documentation

## 2020-02-02 DIAGNOSIS — I5032 Chronic diastolic (congestive) heart failure: Secondary | ICD-10-CM | POA: Diagnosis not present

## 2020-02-02 DIAGNOSIS — I130 Hypertensive heart and chronic kidney disease with heart failure and stage 1 through stage 4 chronic kidney disease, or unspecified chronic kidney disease: Secondary | ICD-10-CM | POA: Diagnosis not present

## 2020-02-02 DIAGNOSIS — N183 Chronic kidney disease, stage 3 unspecified: Secondary | ICD-10-CM | POA: Insufficient documentation

## 2020-02-02 DIAGNOSIS — J449 Chronic obstructive pulmonary disease, unspecified: Secondary | ICD-10-CM | POA: Diagnosis not present

## 2020-02-02 DIAGNOSIS — I252 Old myocardial infarction: Secondary | ICD-10-CM | POA: Diagnosis not present

## 2020-02-02 DIAGNOSIS — R1084 Generalized abdominal pain: Secondary | ICD-10-CM | POA: Diagnosis present

## 2020-02-02 LAB — CBC WITH DIFFERENTIAL/PLATELET
Abs Immature Granulocytes: 0.03 10*3/uL (ref 0.00–0.07)
Basophils Absolute: 0.1 10*3/uL (ref 0.0–0.1)
Basophils Relative: 1 %
Eosinophils Absolute: 0.1 10*3/uL (ref 0.0–0.5)
Eosinophils Relative: 1 %
HCT: 40.2 % (ref 39.0–52.0)
Hemoglobin: 11.6 g/dL — ABNORMAL LOW (ref 13.0–17.0)
Immature Granulocytes: 0 %
Lymphocytes Relative: 16 %
Lymphs Abs: 1.4 10*3/uL (ref 0.7–4.0)
MCH: 26.4 pg (ref 26.0–34.0)
MCHC: 28.9 g/dL — ABNORMAL LOW (ref 30.0–36.0)
MCV: 91.4 fL (ref 80.0–100.0)
Monocytes Absolute: 1.1 10*3/uL — ABNORMAL HIGH (ref 0.1–1.0)
Monocytes Relative: 12 %
Neutro Abs: 6.2 10*3/uL (ref 1.7–7.7)
Neutrophils Relative %: 70 %
Platelets: 319 10*3/uL (ref 150–400)
RBC: 4.4 MIL/uL (ref 4.22–5.81)
RDW: 15.1 % (ref 11.5–15.5)
WBC: 8.9 10*3/uL (ref 4.0–10.5)
nRBC: 0 % (ref 0.0–0.2)

## 2020-02-02 LAB — COMPREHENSIVE METABOLIC PANEL
ALT: 15 U/L (ref 0–44)
AST: 18 U/L (ref 15–41)
Albumin: 3.6 g/dL (ref 3.5–5.0)
Alkaline Phosphatase: 91 U/L (ref 38–126)
Anion gap: 9 (ref 5–15)
BUN: 11 mg/dL (ref 8–23)
CO2: 31 mmol/L (ref 22–32)
Calcium: 9.2 mg/dL (ref 8.9–10.3)
Chloride: 103 mmol/L (ref 98–111)
Creatinine, Ser: 1.36 mg/dL — ABNORMAL HIGH (ref 0.61–1.24)
GFR calc Af Amer: >60 mL/min (ref >60)
GFR calc non Af Amer: 55 mL/min — ABNORMAL LOW (ref >60)
Glucose, Bld: 115 mg/dL — ABNORMAL HIGH (ref 70–99)
Potassium: 4.9 mmol/L (ref 3.5–5.1)
Sodium: 143 mmol/L (ref 135–145)
Total Bilirubin: 0.5 mg/dL (ref 0.3–1.2)
Total Protein: 7.5 g/dL (ref 6.5–8.1)

## 2020-02-02 MED ORDER — METRONIDAZOLE 500 MG PO TABS: ORAL_TABLET | ORAL | 0 refills | Status: DC

## 2020-02-02 MED ORDER — CIPROFLOXACIN HCL 500 MG PO TABS: 500.0000 mg | ORAL_TABLET | Freq: Two times a day (BID) | ORAL | 0 refills | Status: DC

## 2020-02-02 MED ORDER — CIPROFLOXACIN IN D5W 400 MG/200ML IV SOLN
400.0000 mg | Freq: Once | INTRAVENOUS | Status: AC
  Administered 2020-02-02: 400 mg via INTRAVENOUS
  Filled 2020-02-02: qty 200

## 2020-02-02 MED ORDER — ONDANSETRON HCL 4 MG/2ML IJ SOLN
4.0000 mg | Freq: Once | INTRAMUSCULAR | Status: AC
  Administered 2020-02-02: 4 mg via INTRAVENOUS
  Filled 2020-02-02: qty 2

## 2020-02-02 MED ORDER — IOHEXOL 300 MG/ML  SOLN
100.0000 mL | Freq: Once | INTRAMUSCULAR | Status: AC | PRN
  Administered 2020-02-02: 100 mL via INTRAVENOUS

## 2020-02-02 MED ORDER — METRONIDAZOLE IN NACL 5-0.79 MG/ML-% IV SOLN
500.0000 mg | Freq: Once | INTRAVENOUS | Status: AC
  Administered 2020-02-02: 500 mg via INTRAVENOUS
  Filled 2020-02-02: qty 100

## 2020-02-02 MED ORDER — HYDROMORPHONE HCL 1 MG/ML IJ SOLN
0.5000 mg | Freq: Once | INTRAMUSCULAR | Status: AC
  Administered 2020-02-02: 0.5 mg via INTRAVENOUS
  Filled 2020-02-02: qty 1

## 2020-02-02 MED ORDER — SODIUM CHLORIDE 0.9 % IV BOLUS
500.0000 mL | Freq: Once | INTRAVENOUS | Status: AC
  Administered 2020-02-02: 500 mL via INTRAVENOUS

## 2020-02-02 NOTE — Discharge Instructions (Addendum)
Drink plenty of fluids and follow-up with your stomach specialist this week

## 2020-02-02 NOTE — ED Triage Notes (Signed)
Pt reports SHOB since yesterday. Pt has hx of COPD state he has used his inhaler but they are not helping. Pt on oxygen. O2 sats normal

## 2020-02-03 NOTE — ED Provider Notes (Signed)
[facility] EMERGENCY DEPARTMENT Provider Note   CSN: [id number] Arrival date & time: 02/02/20  1635     History Chief Complaint  Patient presents with  . Shortness of Breath    Bryan [name] is a 64 y.o. male.  Patient complains of abdominal pain throughout his abdomen no vomiting no diarrhea  The history is provided by the patient. No language interpreter was used.  Abdominal Pain Pain location:  Generalized Pain quality: aching   Pain radiates to:  Does not radiate Pain severity:  Moderate Onset quality:  Sudden Timing:  Intermittent Progression:  Waxing and waning Chronicity:  New Associated symptoms: no chest pain, no cough, no diarrhea, no fatigue and no hematuria        Past Medical History:  Diagnosis Date  . Acid reflux   . Arthritis   . Asthma   . Chronic pain    with leg and back pain (disc problem)  . COPD (chronic obstructive pulmonary disease) ([place])   . Diabetes mellitus without complication ([place])   . Headache    HX OF  . History of upper GI x-ray series    to follow showed large duodenal ulcer H pylori serologies were negative  . Hypertension   . NSTEMI (non-ST elevated myocardial infarction) ([place]) 11/2019  . Pneumonia    07/17/17  . Pre-diabetes   . Stroke [facility])    TIA MINI STROKE  . Tubular adenoma     Patient Active Problem List   Diagnosis Date Noted  . PUD (peptic ulcer disease) 01/28/2020  . Nonobstructive atherosclerosis of coronary artery   . Pulmonary hypertension due to COPD ([place])   . NSTEMI (non-ST elevated myocardial infarction) ([place]) 11/25/2019  . Non-insulin dependent type 2 diabetes mellitus ([place])   . Mixed hyperlipidemia   . Non-ST elevation (NSTEMI) myocardial infarction ([place]) 11/24/2019  . Oxygen dependent 11/24/2019  . Former smoker 11/24/2019  . Coronary artery calcification seen on CAT scan   . Angina pectoris ([place]) 11/23/2019  . SOB (shortness of breath) 11/23/2019  . Chronic diastolic CHF (congestive heart  failure) ([place]) 08/03/2019  . Acute on chronic respiratory failure ([place]) 08/02/2019  . Acute exacerbation of CHF (congestive heart failure) /HFpEF (EF 60%) 06/13/2019  . Normocytic anemia 02/15/2019  . COPD exacerbation ([place]) 09/16/2018  . Hypokalemia 06/26/2018  . Hypomagnesemia 06/26/2018  . Anemia 06/26/2018  . BPH (benign prostatic hyperplasia) 06/26/2018  . Chronic respiratory failure with hypoxia ([place]) 06/04/2018  . HCAP (healthcare-associated pneumonia) 06/04/2018  . Acute respiratory failure with hypoxia ([place]) 04/14/2018  . Pulmonary nodule 04/14/2018  . CKD (chronic kidney disease) stage 3, GFR 30-59 ml/min 04/14/2018  . Type 2 diabetes mellitus with hyperlipidemia ([place]) 03/12/2018  . BPH with obstruction/lower urinary tract symptoms 08/17/2017  . Left sided numbness 07/22/2017  . TIA (transient ischemic attack) 07/22/2017  . Pneumonia 07/14/2017  . COPD with acute exacerbation ([place]) 07/14/2017  . Constipation 07/10/2017  . Hx of adenomatous colonic polyps 05/01/2017  . Benign hypertension with CKD (chronic kidney disease) stage III   . Chronic pain   . Heme positive stool 10/20/2014  . Epigastric pain 06/19/2014  . Dysphagia 06/19/2014  . AP (abdominal pain) 11/13/2013  . Early satiety 11/13/2013  . COPD (chronic obstructive pulmonary disease) ([place]) 02/22/2013  . Low back pain 11/19/2012  . COLITIS 05/12/2009  . ABDOMINAL PAIN, LEFT LOWER QUADRANT 05/12/2009  . DUODENAL ULCER, HX OF 05/12/2009  . H/o Alcohol abuse- Quit in 2010 05/11/2009  . GERD 05/11/2009  .  Diarrhea 05/11/2009  . SPONDYLOSIS, CERVICAL 07/03/2007  . NECK PAIN 07/03/2007    Past Surgical History:  Procedure Laterality Date  . BACK SURGERY  7/05; 5/09    Dr.Hirsch,3 lumbar  X3  . BACK SURGERY    . COLONOSCOPY  Feb 2012   Dr. Gala Romney: normal rectum, pedunculate polyp removed but not recovered  . COLONOSCOPY WITH ESOPHAGOGASTRODUODENOSCOPY (EGD) N/A 11/18/2013   Dr.Rourk- tcs= normal rectum,  multipe polyps about the ileocecal valve and distal transverse colon o/w the remainder of the colonic mucosa appeared normal bx= tubular adenoma. EGD= normal esophagus, stomach with scattered erosions mottling, friablility, no ulcer or infiltrating process patent pylorus bx= chronic inflammation. next TCS 11/2016  . COLONOSCOPY WITH PROPOFOL N/A 05/25/2017   Dr. Gala Romney: sigmoid diverticulosis, one 4 mm hyerplastic rectal polyp, ascending colonic AVMs surveillance 2023  . ESOPHAGOGASTRODUODENOSCOPY  1/06   Dr. Volney American esophageal erosions,U-shaped stomach,marked erosions and edema of the bulb without discrete ulcer disease.   . ESOPHAGOGASTRODUODENOSCOPY (EGD) WITH PROPOFOL N/A 05/25/2017   Dr. Gala Romney: reflux esophagitis s/p empiric dilation, normal stomach and duodenum  . ESOPHAGOGASTRODUODENOSCOPY (EGD) WITH PROPOFOL Left 11/30/2019   LA Grade D esophagitis, small hiatal hernia, one non-bleeding cratered gastric ulcer in prepyloric region of stomach, many non-bleeding cratered duodenal ulcers without stigmata of bleeding, mild luminal narrowing at apex of duodenal bulb but traversed easily. H.pylori serology negative.  Marland Kitchen MALONEY DILATION N/A 05/25/2017   Procedure: Venia Minks DILATION;  Surgeon: Daneil Dolin, MD;  Location[facility];  Service: Endoscopy;  Laterality: N/A;  . NECK SURGERY  10/2007   PLATE IN NECK  . POLYPECTOMY  05/25/2017   Procedure: POLYPECTOMY;  Surgeon: Daneil Dolin, MD;  Location: [facility];  Service: Endoscopy;;  rectal  . RIGHT/LEFT HEART CATH AND CORONARY ANGIOGRAPHY N/A 12/03/2019   Procedure: RIGHT/LEFT HEART CATH AND CORONARY ANGIOGRAPHY;  Surgeon: Nigel Mormon, MD;  Location: [facility] LAB;  Service: Cardiovascular;  Laterality: N/A;  . TRANSURETHRAL RESECTION OF PROSTATE N/A 08/17/2017   Procedure: TRANSURETHRAL RESECTION OF THE PROSTATE (TURP);  Surgeon: Irine Seal, MD;  Location: [facility];  Service: Urology;  Laterality: N/A;       Family  History  Problem Relation Age of Onset  . Cancer Father   . Asthma Mother   . Colon cancer Neg Hx     Social History   Tobacco Use  . Smoking status: Former Smoker    Packs/day: 0.50    Years: 40.00    Pack years: 20.00    Types: Cigarettes    Quit date: 11/11/2014    Years since quitting: 5.2  . Smokeless tobacco: Never Used  Substance Use Topics  . Alcohol use: No    Alcohol/week: 0.0 standard drinks  . Drug use: Not Currently    Types: Marijuana    Comment: occas; denied 01/28/20    Home Medications Prior to Admission medications   Medication Sig Start Date End Date Taking? Authorizing Provider  acetaminophen (TYLENOL) 325 MG tablet Take 2 tablets (650 mg total) by mouth every 6 (six) hours as needed for mild pain (or Fever >/= 101). 06/14/19   Roxan Hockey, MD  albuterol (PROVENTIL) (2.5 MG/3ML) 0.083% nebulizer solution Take 3 mLs (2.5 mg total) by nebulization every 4 (four) hours as needed for wheezing or shortness of breath. 06/14/19   Roxan Hockey, MD  albuterol (VENTOLIN HFA) 108 (90 Base) MCG/ACT inhaler Inhale 2 puffs into the lungs every 4 (four) hours as needed for  wheezing or shortness of breath. Do not use with nebulizer 06/14/19   Roxan Hockey, MD  calcium carbonate (TUMS - DOSED IN MG ELEMENTAL CALCIUM) 500 MG chewable tablet Chew 2 tablets (400 mg of elemental calcium total) by mouth 3 (three) times daily as needed for indigestion or heartburn. 12/04/19   Amin, Jeanella Flattery, MD  ciprofloxacin (CIPRO) 500 MG tablet Take 1 tablet (500 mg total) by mouth 2 (two) times daily. One po bid x 7 days 02/02/20   Milton Ferguson, MD  Fluticasone-Umeclidin-Vilant (TRELEGY ELLIPTA) 100-62.5-25 MCG/INH AEPB Inhale 1 puff into the lungs daily. 06/14/19   Roxan Hockey, MD  gabapentin (NEURONTIN) 300 MG capsule Take 1 capsule (300 mg total) by mouth 2 (two) times daily. Pt. Says he is taking twice daily. Patient taking differently: Take 300 mg by mouth 2 (two) times daily.   06/14/19   Roxan Hockey, MD  guaiFENesin (MUCINEX) 600 MG 12 hr tablet Take 1 tablet (600 mg total) by mouth 2 (two) times daily. Patient taking differently: Take 600 mg by mouth daily.  06/14/19 06/13/20  Roxan Hockey, MD  ibuprofen (ADVIL) 800 MG tablet Take 800 mg by mouth daily as needed. 01/08/20   [provider]  linaclotide Rolan Lipa) 72 MCG capsule Take 1 capsule (72 mcg total) by mouth daily before breakfast. 06/14/19   Roxan Hockey, MD  metoprolol tartrate (LOPRESSOR) 25 MG tablet Take 0.5 tablets (12.5 mg total) by mouth 2 (two) times daily. 12/04/19 02/02/20  Damita Lack, MD  metroNIDAZOLE (FLAGYL) 500 MG tablet One pill qid 02/02/20   Milton Ferguson, MD  OXYGEN Inhale 2.5 L into the lungs daily.    [provider]  pantoprazole (PROTONIX) 40 MG tablet Take 1 tablet (40 mg total) by mouth 2 (two) times daily before a meal. 12/04/19 01/28/20  Amin, Jeanella Flattery, MD  polyethylene glycol powder (MIRALAX) 17 GM/SCOOP powder Take 17 g by mouth daily as needed (FOR CONSTIPATION).  11/14/18   [provider]    Allergies    Patient has no known allergies.  Review of Systems   Review of Systems  Constitutional: Negative for appetite change and fatigue.  HENT: Negative for congestion, ear discharge and sinus pressure.   Eyes: Negative for discharge.  Respiratory: Negative for cough.   Cardiovascular: Negative for chest pain.  Gastrointestinal: Positive for abdominal pain. Negative for diarrhea.  Genitourinary: Negative for frequency and hematuria.  Musculoskeletal: Negative for back pain.  Skin: Negative for rash.  Neurological: Negative for seizures and headaches.  Psychiatric/Behavioral: Negative for hallucinations.    Physical Exam Updated Vital Signs BP (!) 152/85   Pulse 92   Temp 98.7 F (37.1 C) (Oral)   Resp 18   Ht _0  (1.6 m)   Wt 55.3 kg   SpO2 96%   BMI 21.61 kg/m   Physical Exam Vitals and nursing note reviewed.    Constitutional:      Appearance: He is well-developed.  HENT:     Head: Normocephalic.     Nose: Nose normal.  Eyes:     General: No scleral icterus.    Conjunctiva/sclera: Conjunctivae normal.  Neck:     Thyroid: No thyromegaly.  Cardiovascular:     Rate and Rhythm: Normal rate and regular rhythm.     Heart sounds: No murmur. No friction rub. No gallop.   Pulmonary:     Breath sounds: No stridor. No wheezing or rales.  Chest:     Chest wall: No tenderness.  Abdominal:     General: There is no distension.     Tenderness: There is abdominal tenderness. There is no rebound.  Musculoskeletal:        General: Normal range of motion.     Cervical back: Neck supple.  Lymphadenopathy:     Cervical: No cervical adenopathy.  Skin:    Findings: No erythema or rash.  Neurological:     Mental Status: He is alert and oriented to person, place, and time.     Motor: No abnormal muscle tone.     Coordination: Coordination normal.  Psychiatric:        Behavior: Behavior normal.     ED Results / Procedures / Treatments   Labs (all labs ordered are listed, but only abnormal results are displayed) Labs Reviewed  CBC WITH DIFFERENTIAL/PLATELET - Abnormal; Notable for the following components:      Result Value   Hemoglobin 11.6 (*)    MCHC 28.9 (*)    Monocytes Absolute 1.1 (*)    All other components within normal limits  COMPREHENSIVE METABOLIC PANEL - Abnormal; Notable for the following components:   Glucose, Bld 115 (*)    Creatinine, Ser 1.36 (*)    GFR calc non Af Amer 55 (*)    All other components within normal limits    EKG None  Radiology DG Chest 2 View  Result Date: 02/02/2020 CLINICAL DATA:  Shortness of breath. EXAM: CHEST - 2 VIEW COMPARISON:  December 02, 2019 FINDINGS: Mild diffuse chronic appearing increased lung markings are seen which is unchanged in severity when compared to the prior study. There is no evidence of acute infiltrate, pleural effusion or  pneumothorax. The heart size and mediastinal contours are within normal limits. A radiopaque fusion plate and screws are seen overlying the lower cervical spine. IMPRESSION: No active cardiopulmonary disease. Electronically Signed   By: Virgina Norfolk M.D.   On: 02/02/2020 17:13   CT ABDOMEN PELVIS W CONTRAST  Result Date: 02/02/2020 CLINICAL DATA:  Abdominal pain. Neutropenia. EXAM: CT ABDOMEN AND PELVIS WITH CONTRAST TECHNIQUE: Multidetector CT imaging of the abdomen and pelvis was performed using the standard protocol following bolus administration of intravenous contrast. CONTRAST:  175m OMNIPAQUE IOHEXOL 300 MG/ML  SOLN COMPARISON:  November 27, 2019 FINDINGS: Lower chest: The lung bases are clear. The heart size is normal. Hepatobiliary: The liver is normal. Normal gallbladder.There is no biliary ductal dilation. Pancreas: Normal contours without ductal dilatation. No peripancreatic fluid collection. Spleen: Unremarkable. Adrenals/Urinary Tract: --Adrenal glands: Unremarkable. --Right kidney/ureter: No hydronephrosis or radiopaque kidney stones. --Left kidney/ureter: No hydronephrosis or radiopaque kidney stones. --Urinary bladder: Unremarkable. Stomach/Bowel: --Stomach/Duodenum: No hiatal hernia or other gastric abnormality. Normal duodenal course and caliber. --Small bowel: Unremarkable. --Colon: There is some questionable mild wall thickening of the sigmoid colon. --Appendix: Normal. Vascular/Lymphatic: Atherosclerotic calcification is present within the non-aneurysmal abdominal aorta, without hemodynamically significant stenosis. --No retroperitoneal lymphadenopathy. --No mesenteric lymphadenopathy. --No pelvic or inguinal lymphadenopathy. Reproductive: Unremarkable Other: No ascites or free air. The abdominal wall is normal. Musculoskeletal. No acute displaced fractures. IMPRESSION: 1. There is some questionable mild wall thickening of the sigmoid colon. Correlate for signs and symptoms of colitis.  2. Normal appendix in the right lower quadrant. Aortic Atherosclerosis (ICD10-I70.0). Electronically Signed   By: CConstance HolsterM.D.   On: 02/02/2020 20:18    Procedures Procedures (including critical care time)  Medications Ordered in ED Medications  sodium chloride 0.9 % bolus 500 mL (0 mLs Intravenous Stopped 02/02/20 2058)  HYDROmorphone (DILAUDID) injection 0.5 mg (0.5 mg Intravenous Given 02/02/20 1912)  ondansetron (ZOFRAN) injection 4 mg (4 mg Intravenous Given 02/02/20 1912)  iohexol (OMNIPAQUE) 300 MG/ML solution 100 mL (100 mLs Intravenous Contrast Given 02/02/20 2000)  metroNIDAZOLE (FLAGYL) IVPB 500 mg (0 mg Intravenous Stopped 02/02/20 2202)  ciprofloxacin (CIPRO) IVPB 400 mg (0 mg Intravenous Stopped 02/02/20 2202)    ED Course  I have reviewed the triage vital signs and the nursing notes.  Pertinent labs & imaging results that were available during my care of the patient were reviewed by me and considered in my medical decision making (see chart for details).    MDM Rules/Calculators/A&P                      Patient with mild abdominal pain.  CT scan suggests colitis.  He will be placed on Flagyl and Cipro and follow-up with PCP       This patient presents to the ED for concern of abdominal pain this involves an extensive number of treatment options, and is a complaint that carries with it a high risk of complications and morbidity.  The differential diagnosis includes appendicitis cancer   Lab Tests:   I Ordered, reviewed, and interpreted labs, which included CBC chemistry which showed anemia  Medicines ordered:   I ordered medication Flagyl Cipro for colitis  Imaging Studies ordered:   I ordered imaging studies which included CT abdomen and  I independently visualized and interpreted imaging which showed colitis  Additional history obtained:   Additional history obtained from records  Previous records obtained and reviewed  Consultations  Obtained:   Reevaluation:  After the interventions stated above, I reevaluated the patient and found unchanged  Critical Interventions:  .   Final Clinical Impression(s) / ED Diagnoses Final diagnoses:  Colitis    Rx / DC Orders ED Discharge Orders         Ordered    ciprofloxacin (CIPRO) 500 MG tablet  2 times daily     02/02/20 2039    metroNIDAZOLE (FLAGYL) 500 MG tablet     02/02/20 2039           Milton Ferguson, MD 02/03/20 1000

## 2020-02-05 ENCOUNTER — Other Ambulatory Visit: Payer: Self-pay | Admitting: *Deleted

## 2020-02-05 ENCOUNTER — Other Ambulatory Visit: Payer: Self-pay

## 2020-02-05 NOTE — Patient Outreach (Addendum)
[facility]ryan [name]) Care Management  02/05/2020  Bryan [name] 29-Jun-1956 [id number]   outreach to Bryan [name] complex care patient  Date of Admission:11/23/19 Diagnosis:Non Bryan elevation myocardial infarction (NSTEMI), acute on chronic hypoxic respiratory failure (multifactorial) pulmonary arterial hypertension (moderate-WHO Grp3), acutecongestive Heart Failure (CHF), esophagitis withnon bleedinggastric/duodenal ulcers/duodenal stenosis without obstruction Date of Discharge:12/04/19 [name]  Bryan [name] was initially referred to Bryan [name] on 10/07/19 as an [name] referral for assistance with finding an affordable housing.  Referral [facility] Referral Reason:Medium referral (within 10 business day outreach) - Bryan [name], phone [phone]** Unsure if member is still active with [name], Mentions that he needs assistance finding an affordable place with central air and heat, He lives in a house with a fireplace and is unable to use the oil heat due to his oxygen, Is there a list that member can find or a list that can be mailed to member? Discipline requested [name] LSW Insurance:[employer] medicare   Successful outreach  Bryan [name] is able to verify HIPAA ([place] and Accountability Act) identifiers, date of birth (DOB) and address Reviewed need for follow up with patient related to colitis     Follow up after ED visit on Feb 02 2020 - Colitis   Bryan [name] states he continues to have abdominal pain since he was seen in the ED. He picked up his medicine from his pharmacy on Monday 02/03/2020 He obatined the cipro and flagyl He also confirms he called Bryan [name] to attempt to obtain an ED follow appointment but was informed the next available appointment is July 20221 and he was also placed on  a patient waiting list  He believes his abdominal pain may be related to one of his medications and reports he experience most of his pain in  afternoons to the point the he has had some increased shortness of breath (sob)   Medication review completed with him Discussed purposes and side effects for metropolol, calcium carbonate, mucinex, acetaminophen, albuterol, , linzess, flagyl, protonix and miralax He reports he has not taken miralax in a while to prevent diarrhea He reports his primary MD provided him with  baclfofen in ~ December 2020 He denies pain or spasms at this time. Baclofen is not on the listed Epic medications. Bryan [name] reports he was going to discontinue them as a few side effect are nausea, & constipation. [facility] RN CM also reviewed his discharge summary to find that he has been only taking one flagyl (related to the taste) verses as it was ordered as Flagyl 1 four times a day. He reports he will start taking this antibiotic as ordered but [facility] RN CM also discussed Flagyl side effects of metallic taste, diarrhea, He was encouraged to take it with food or a full glass of water or milk He voiced understanding snd confirms he drinks lots of water  [facility] RN CM sent Bryan [name] EMMI information on colitis via mail and list of providers  DME glasses   appointments  02/17/20 [name] pulmonology  [name] RN CM will follow up with Bryan [name] within the next 7-14 business days  Pt encouraged to return a call to [name] RN CM prn Routed note to MD  [facility] CM Care Plan Problem One     Most Recent Value  Care Plan Problem One  Housing improvement- COPD  Role Documenting the Problem One  Care Management Telephonic [place] for Problem One  Active  [facility] Long Term Goal   over the next 30 days patient will receive resources for improved housing beneficial to managing his COPDt  [name] Long Term Goal Start Date  10/14/19  [name] Long Term Goal Met Date  11/13/19  [facility] CM Short Term Goal #1   over the next 14 days patient will complete an application to the local housing authority  [name] CM Short Term Goal #1 Start Date  10/14/19  [name] CM  Short Term Goal #1 Met Date  11/13/19    [name] CM Care Plan Problem Two     Most Recent Value  Care Plan Problem Two  Risk for worsening symptoms of COPD, HTN, DM   Role Documenting the Problem Two  Care Management Telephonic Coordinator  Care Plan for Problem Two  Active  Interventions for Problem Two Long Term Goal   Transition of care assessment completed, review of medications and review of [name] discharge sheet to include medicines, discussed how to take flagyl  as ordered at [name] discharge, encoruaged contact to GI office   [facility] Long Term Goal  Over the next 45 days, patient will not be hospitalized for complications related to COPD, DM & HTN   [facility] Long Term Goal Start Date  12/05/19  [facility] CM Short Term Goal #1   over the next 21 days patient will be able to obtain extra oxygen tanks to prevent worsening symptoms of COPD as evidence by verbalizaton on next contact that DME received   [facility] CM Short Term Goal #1 Start Date  10/29/19  [name] CM Short Term Goal #1 Met Date   11/13/19  [facility] CM Short Term Goal #2   over the next 14 days he will have hosptial follow up visits with his primary care provider as verbilized during outreach   [facility] CM Short Term Goal #2 Start Date  02/05/20  Interventions for Short Term Goal #2  Transition of care assessment completed, review of medications and review of [name] discharge sheet to include medicines, discussed how to take flagyl  as ordered at [name] discharge, encoruaged contact to GI & pcp office   [facility] CM Short Term Goal #3   over the next 21 days patient will be assisted to obtain portable oxygen as evidence of verbalization during future outreach  [facility] CM Short Term Goal #3 Start Date  12/12/19  [name] CM Short Term Goal #3 Met Date  02/05/20       [name], RN, BSN, [place] Coordinator Office number [phone] Mobile number [phone]  Main [facility] number [phone] Fax number [phone]

## 2020-02-12 ENCOUNTER — Inpatient Hospital Stay (HOSPITAL_COMMUNITY)
Admission: EM | Admit: 2020-02-12 | Discharge: 2020-02-14 | DRG: 190 | Disposition: A | Discharge status: Home / Self Care | Payer: Medicare Other | Attending: Internal Medicine | Admitting: Internal Medicine

## 2020-02-12 ENCOUNTER — Other Ambulatory Visit: Payer: Self-pay

## 2020-02-12 ENCOUNTER — Emergency Department (HOSPITAL_COMMUNITY): Payer: Medicare Other

## 2020-02-12 ENCOUNTER — Other Ambulatory Visit: Payer: Self-pay | Admitting: *Deleted

## 2020-02-12 DIAGNOSIS — N400 Enlarged prostate without lower urinary tract symptoms: Secondary | ICD-10-CM | POA: Diagnosis present

## 2020-02-12 DIAGNOSIS — R1011 Right upper quadrant pain: Secondary | ICD-10-CM

## 2020-02-12 DIAGNOSIS — G8929 Other chronic pain: Secondary | ICD-10-CM | POA: Diagnosis present

## 2020-02-12 DIAGNOSIS — I129 Hypertensive chronic kidney disease with stage 1 through stage 4 chronic kidney disease, or unspecified chronic kidney disease: Secondary | ICD-10-CM | POA: Diagnosis present

## 2020-02-12 DIAGNOSIS — K209 Esophagitis, unspecified without bleeding: Secondary | ICD-10-CM | POA: Diagnosis present

## 2020-02-12 DIAGNOSIS — I130 Hypertensive heart and chronic kidney disease with heart failure and stage 1 through stage 4 chronic kidney disease, or unspecified chronic kidney disease: Secondary | ICD-10-CM | POA: Diagnosis present

## 2020-02-12 DIAGNOSIS — Z87891 Personal history of nicotine dependence: Secondary | ICD-10-CM

## 2020-02-12 DIAGNOSIS — E1122 Type 2 diabetes mellitus with diabetic chronic kidney disease: Secondary | ICD-10-CM | POA: Diagnosis present

## 2020-02-12 DIAGNOSIS — Z79899 Other long term (current) drug therapy: Secondary | ICD-10-CM | POA: Diagnosis not present

## 2020-02-12 DIAGNOSIS — K5732 Diverticulitis of large intestine without perforation or abscess without bleeding: Secondary | ICD-10-CM | POA: Diagnosis not present

## 2020-02-12 DIAGNOSIS — R300 Dysuria: Secondary | ICD-10-CM | POA: Diagnosis present

## 2020-02-12 DIAGNOSIS — Z825 Family history of asthma and other chronic lower respiratory diseases: Secondary | ICD-10-CM | POA: Diagnosis not present

## 2020-02-12 DIAGNOSIS — I2510 Atherosclerotic heart disease of native coronary artery without angina pectoris: Secondary | ICD-10-CM | POA: Diagnosis present

## 2020-02-12 DIAGNOSIS — N1831 Chronic kidney disease, stage 3a: Secondary | ICD-10-CM | POA: Diagnosis present

## 2020-02-12 DIAGNOSIS — Z9981 Dependence on supplemental oxygen: Secondary | ICD-10-CM | POA: Diagnosis not present

## 2020-02-12 DIAGNOSIS — K2970 Gastritis, unspecified, without bleeding: Secondary | ICD-10-CM | POA: Diagnosis present

## 2020-02-12 DIAGNOSIS — I252 Old myocardial infarction: Secondary | ICD-10-CM | POA: Diagnosis not present

## 2020-02-12 DIAGNOSIS — Z86010 Personal history of colonic polyps: Secondary | ICD-10-CM

## 2020-02-12 DIAGNOSIS — I708 Atherosclerosis of other arteries: Secondary | ICD-10-CM | POA: Diagnosis present

## 2020-02-12 DIAGNOSIS — I5033 Acute on chronic diastolic (congestive) heart failure: Secondary | ICD-10-CM | POA: Diagnosis present

## 2020-02-12 DIAGNOSIS — J441 Chronic obstructive pulmonary disease with (acute) exacerbation: Principal | ICD-10-CM

## 2020-02-12 DIAGNOSIS — J9601 Acute respiratory failure with hypoxia: Secondary | ICD-10-CM | POA: Diagnosis present

## 2020-02-12 DIAGNOSIS — R933 Abnormal findings on diagnostic imaging of other parts of digestive tract: Secondary | ICD-10-CM

## 2020-02-12 DIAGNOSIS — Z20822 Contact with and (suspected) exposure to covid-19: Secondary | ICD-10-CM | POA: Diagnosis present

## 2020-02-12 DIAGNOSIS — M1990 Unspecified osteoarthritis, unspecified site: Secondary | ICD-10-CM | POA: Diagnosis present

## 2020-02-12 DIAGNOSIS — K529 Noninfective gastroenteritis and colitis, unspecified: Secondary | ICD-10-CM | POA: Diagnosis present

## 2020-02-12 DIAGNOSIS — J9621 Acute and chronic respiratory failure with hypoxia: Secondary | ICD-10-CM | POA: Diagnosis present

## 2020-02-12 DIAGNOSIS — Z8673 Personal history of transient ischemic attack (TIA), and cerebral infarction without residual deficits: Secondary | ICD-10-CM

## 2020-02-12 DIAGNOSIS — N183 Chronic kidney disease, stage 3 unspecified: Secondary | ICD-10-CM | POA: Diagnosis present

## 2020-02-12 DIAGNOSIS — Z8711 Personal history of peptic ulcer disease: Secondary | ICD-10-CM

## 2020-02-12 DIAGNOSIS — R1032 Left lower quadrant pain: Secondary | ICD-10-CM | POA: Diagnosis not present

## 2020-02-12 LAB — URINALYSIS, ROUTINE W REFLEX MICROSCOPIC
Bacteria, UA: NONE SEEN
Bilirubin Urine: NEGATIVE
Glucose, UA: NEGATIVE mg/dL
Hgb urine dipstick: NEGATIVE
Ketones, ur: NEGATIVE mg/dL
Leukocytes,Ua: NEGATIVE
Nitrite: NEGATIVE
Protein, ur: 100 mg/dL — AB
Specific Gravity, Urine: 1.01 (ref 1.005–1.030)
pH: 6 (ref 5.0–8.0)

## 2020-02-12 LAB — CBC WITH DIFFERENTIAL/PLATELET
Abs Immature Granulocytes: 0.03 10*3/uL (ref 0.00–0.07)
Basophils Absolute: 0 10*3/uL (ref 0.0–0.1)
Basophils Relative: 1 %
Eosinophils Absolute: 0.1 10*3/uL (ref 0.0–0.5)
Eosinophils Relative: 1 %
HCT: 38.6 % — ABNORMAL LOW (ref 39.0–52.0)
Hemoglobin: 11.3 g/dL — ABNORMAL LOW (ref 13.0–17.0)
Immature Granulocytes: 0 %
Lymphocytes Relative: 8 %
Lymphs Abs: 0.7 10*3/uL (ref 0.7–4.0)
MCH: 26.2 pg (ref 26.0–34.0)
MCHC: 29.3 g/dL — ABNORMAL LOW (ref 30.0–36.0)
MCV: 89.4 fL (ref 80.0–100.0)
Monocytes Absolute: 0.7 10*3/uL (ref 0.1–1.0)
Monocytes Relative: 8 %
Neutro Abs: 6.6 10*3/uL (ref 1.7–7.7)
Neutrophils Relative %: 82 %
Platelets: 367 10*3/uL (ref 150–400)
RBC: 4.32 MIL/uL (ref 4.22–5.81)
RDW: 15.9 % — ABNORMAL HIGH (ref 11.5–15.5)
WBC: 8.1 10*3/uL (ref 4.0–10.5)
nRBC: 0 % (ref 0.0–0.2)

## 2020-02-12 LAB — COMPREHENSIVE METABOLIC PANEL
ALT: 13 U/L (ref 0–44)
AST: 18 U/L (ref 15–41)
Albumin: 3.5 g/dL (ref 3.5–5.0)
Alkaline Phosphatase: 79 U/L (ref 38–126)
Anion gap: 10 (ref 5–15)
BUN: 11 mg/dL (ref 8–23)
CO2: 29 mmol/L (ref 22–32)
Calcium: 9 mg/dL (ref 8.9–10.3)
Chloride: 101 mmol/L (ref 98–111)
Creatinine, Ser: 1.47 mg/dL — ABNORMAL HIGH (ref 0.61–1.24)
GFR calc Af Amer: 58 mL/min — ABNORMAL LOW (ref >60)
GFR calc non Af Amer: 50 mL/min — ABNORMAL LOW (ref >60)
Glucose, Bld: 97 mg/dL (ref 70–99)
Potassium: 4.2 mmol/L (ref 3.5–5.1)
Sodium: 140 mmol/L (ref 135–145)
Total Bilirubin: 0.6 mg/dL (ref 0.3–1.2)
Total Protein: 7.4 g/dL (ref 6.5–8.1)

## 2020-02-12 LAB — TROPONIN I (HIGH SENSITIVITY)
Troponin I (High Sensitivity): 10 ng/L (ref <18)
Troponin I (High Sensitivity): 11 ng/L (ref <18)

## 2020-02-12 LAB — BRAIN NATRIURETIC PEPTIDE: B Natriuretic Peptide: 474 pg/mL — ABNORMAL HIGH (ref 0.0–100.0)

## 2020-02-12 LAB — SARS CORONAVIRUS 2 BY RT PCR (HOSPITAL ORDER, PERFORMED IN ~~LOC~~ HOSPITAL LAB): SARS Coronavirus 2: NEGATIVE

## 2020-02-12 LAB — LIPASE, BLOOD: Lipase: 38 U/L (ref 11–51)

## 2020-02-12 MED ORDER — SODIUM CHLORIDE 0.9% FLUSH: 3.0000 mL | INTRAVENOUS | Status: DC | PRN

## 2020-02-12 MED ORDER — CALCIUM CARBONATE ANTACID 500 MG PO CHEW: 2.0000 | CHEWABLE_TABLET | Freq: Three times a day (TID) | ORAL | Status: DC | PRN

## 2020-02-12 MED ORDER — FUROSEMIDE 10 MG/ML IJ SOLN
40.0000 mg | Freq: Every day | INTRAMUSCULAR | Status: DC
  Administered 2020-02-13: 40 mg via INTRAVENOUS
  Filled 2020-02-12: qty 4

## 2020-02-12 MED ORDER — HEPARIN SODIUM (PORCINE) 5000 UNIT/ML IJ SOLN
5000.0000 [IU] | Freq: Three times a day (TID) | INTRAMUSCULAR | Status: DC
  Administered 2020-02-12 – 2020-02-14 (×5): 5000 [IU] via SUBCUTANEOUS
  Filled 2020-02-12 (×7): qty 1

## 2020-02-12 MED ORDER — IOHEXOL 300 MG/ML  SOLN
100.0000 mL | Freq: Once | INTRAMUSCULAR | Status: AC | PRN
  Administered 2020-02-12: 100 mL via INTRAVENOUS

## 2020-02-12 MED ORDER — SODIUM CHLORIDE 0.9 % IV SOLN: 250.0000 mL | INTRAVENOUS | Status: DC | PRN

## 2020-02-12 MED ORDER — FUROSEMIDE 10 MG/ML IJ SOLN
40.0000 mg | Freq: Once | INTRAMUSCULAR | Status: AC
  Administered 2020-02-12: 40 mg via INTRAVENOUS
  Filled 2020-02-12: qty 4

## 2020-02-12 MED ORDER — PANTOPRAZOLE SODIUM 40 MG PO TBEC
40.0000 mg | DELAYED_RELEASE_TABLET | Freq: Two times a day (BID) | ORAL | Status: DC
  Administered 2020-02-13 – 2020-02-14 (×3): 40 mg via ORAL
  Filled 2020-02-12 (×3): qty 1

## 2020-02-12 MED ORDER — IPRATROPIUM-ALBUTEROL 20-100 MCG/ACT IN AERS
1.0000 | INHALATION_SPRAY | Freq: Four times a day (QID) | RESPIRATORY_TRACT | Status: DC
  Administered 2020-02-12 (×3): 1 via RESPIRATORY_TRACT
  Filled 2020-02-12: qty 4

## 2020-02-12 MED ORDER — GABAPENTIN 300 MG PO CAPS
300.0000 mg | ORAL_CAPSULE | Freq: Two times a day (BID) | ORAL | Status: DC
  Administered 2020-02-12 – 2020-02-14 (×4): 300 mg via ORAL
  Filled 2020-02-12 (×4): qty 1

## 2020-02-12 MED ORDER — SODIUM CHLORIDE 0.9 % IV SOLN
2.0000 g | INTRAVENOUS | Status: DC
  Administered 2020-02-12 – 2020-02-13 (×2): 2 g via INTRAVENOUS
  Filled 2020-02-12 (×2): qty 20

## 2020-02-12 MED ORDER — IPRATROPIUM-ALBUTEROL 0.5-2.5 (3) MG/3ML IN SOLN: 3.0000 mL | Freq: Three times a day (TID) | RESPIRATORY_TRACT | Status: DC

## 2020-02-12 MED ORDER — GUAIFENESIN ER 600 MG PO TB12
600.0000 mg | ORAL_TABLET | Freq: Every day | ORAL | Status: DC
  Administered 2020-02-12 – 2020-02-14 (×3): 600 mg via ORAL
  Filled 2020-02-12 (×3): qty 1

## 2020-02-12 MED ORDER — POLYETHYLENE GLYCOL 3350 17 GM/SCOOP PO POWD
17.0000 g | Freq: Every day | ORAL | Status: DC | PRN
  Filled 2020-02-12: qty 255

## 2020-02-12 MED ORDER — SODIUM CHLORIDE 0.9% FLUSH
3.0000 mL | Freq: Two times a day (BID) | INTRAVENOUS | Status: DC
  Administered 2020-02-12 – 2020-02-14 (×4): 3 mL via INTRAVENOUS

## 2020-02-12 MED ORDER — PREDNISONE 50 MG PO TABS
60.0000 mg | ORAL_TABLET | Freq: Once | ORAL | Status: AC
  Administered 2020-02-12: 60 mg via ORAL
  Filled 2020-02-12: qty 1

## 2020-02-12 MED ORDER — METRONIDAZOLE IN NACL 5-0.79 MG/ML-% IV SOLN
500.0000 mg | Freq: Three times a day (TID) | INTRAVENOUS | Status: DC
  Administered 2020-02-12 – 2020-02-14 (×6): 500 mg via INTRAVENOUS
  Filled 2020-02-12 (×6): qty 100

## 2020-02-12 MED ORDER — ONDANSETRON HCL 4 MG/2ML IJ SOLN: 4.0000 mg | Freq: Four times a day (QID) | INTRAMUSCULAR | Status: DC | PRN

## 2020-02-12 MED ORDER — BUDESONIDE 0.5 MG/2ML IN SUSP
0.5000 mg | Freq: Two times a day (BID) | RESPIRATORY_TRACT | Status: DC
  Administered 2020-02-12 – 2020-02-14 (×4): 0.5 mg via RESPIRATORY_TRACT
  Filled 2020-02-12 (×4): qty 2

## 2020-02-12 MED ORDER — METOPROLOL TARTRATE 25 MG PO TABS
12.5000 mg | ORAL_TABLET | Freq: Two times a day (BID) | ORAL | Status: DC
  Administered 2020-02-12 – 2020-02-14 (×4): 12.5 mg via ORAL
  Filled 2020-02-12 (×4): qty 1

## 2020-02-12 MED ORDER — ACETAMINOPHEN 325 MG PO TABS
650.0000 mg | ORAL_TABLET | ORAL | Status: DC | PRN
  Administered 2020-02-12: 650 mg via ORAL
  Filled 2020-02-12: qty 2

## 2020-02-12 MED ORDER — IPRATROPIUM-ALBUTEROL 0.5-2.5 (3) MG/3ML IN SOLN
3.0000 mL | Freq: Three times a day (TID) | RESPIRATORY_TRACT | Status: DC
  Administered 2020-02-13 – 2020-02-14 (×5): 3 mL via RESPIRATORY_TRACT
  Filled 2020-02-12 (×5): qty 3

## 2020-02-12 MED ORDER — LINACLOTIDE 72 MCG PO CAPS
72.0000 ug | ORAL_CAPSULE | Freq: Every day | ORAL | Status: DC
  Filled 2020-02-12 (×5): qty 1

## 2020-02-12 MED ORDER — PREDNISONE 20 MG PO TABS
50.0000 mg | ORAL_TABLET | Freq: Every day | ORAL | Status: DC
  Administered 2020-02-13 – 2020-02-14 (×2): 50 mg via ORAL
  Filled 2020-02-12 (×2): qty 1

## 2020-02-12 NOTE — ED Notes (Signed)
Pt ambulated with oxygen and cardiac monitor to corner of nurses station and back. Pt reports increased dyspnea. Pulse Ox remained greater than 93% while ambulating. Pt sitting on side of bed,Pulse ox noted to be 81%. Increased [place] to 4 liters. Respirations during ambulation noted to range from 35-40. PA aware and reported pt to be admitted.

## 2020-02-12 NOTE — ED Provider Notes (Signed)
[facility] EMERGENCY DEPARTMENT Provider Note   CSN: [id number] Arrival date & time: 02/12/20  0831     History Chief Complaint  Patient presents with  . Shortness of Breath    Bryan [name] is a 64 y.o. male past medical history significant for COPD on chronic 2 L nasal cannula, chronic leg and back pain, and STEMI, hypertension, prediabetes, TIA presents to emergency department today with chief complaint of progressively worsening shortness of breath x3 days. Patient states his symptoms have been going on x1 week. He states he feels extremely short of breath with any kind of physical activity. He states this is not new for him. He states this morning when he woke up he felt more short of breath than usual and was wheezing so he tried using a nebulizer without any symptom relief. He is also endorsing midsternal chest pain. He states that it also been present x1 week. He states that pain is intermittent. He is unsure if the pain is worse with exertion. He rates that pain four of 10 in severity. Patient was also seen in the emergency department on 02/02/2020 with abdominal pain. He had a CT scan that was suggestive of colitis and he was discharged with Flagyl and Cipro. He states despite taking these medications he has had continued abdominal pain. He is also endorsing dysuria. His pain is located on his left lower quadrant and radiates throughout entire abdomen. He states he called his PCP to schedule follow-up appointment however is unable to get an appointment for the next x1 month. He states he had a bowel movement this morning that was normal, no blood in stool, no diarrhea. He states he has history of intermittent constipation. He denies any fever, chills, cough, diaphoresis, back pain, nausea, emesis, gross hematuria, urinary frequency, penile discharge, testicular or scrotal pain, lower extremity edema.  Past Medical History:  Diagnosis Date  . Acid reflux   . Arthritis   . Asthma   .  Chronic pain    with leg and back pain (disc problem)  . COPD (chronic obstructive pulmonary disease) ([place])   . Diabetes mellitus without complication ([place])   . Headache    HX OF  . History of upper GI x-ray series    to follow showed large duodenal ulcer H pylori serologies were negative  . Hypertension   . NSTEMI (non-ST elevated myocardial infarction) ([place]) 11/2019  . Pneumonia    07/17/17  . Pre-diabetes   . Stroke [facility])    TIA MINI STROKE  . Tubular adenoma     Patient Active Problem List   Diagnosis Date Noted  . PUD (peptic ulcer disease) 01/28/2020  . Nonobstructive atherosclerosis of coronary artery   . Pulmonary hypertension due to COPD ([place])   . NSTEMI (non-ST elevated myocardial infarction) ([place]) 11/25/2019  . Non-insulin dependent type 2 diabetes mellitus ([place])   . Mixed hyperlipidemia   . Non-ST elevation (NSTEMI) myocardial infarction ([place]) 11/24/2019  . Oxygen dependent 11/24/2019  . Former smoker 11/24/2019  . Coronary artery calcification seen on CAT scan   . Angina pectoris ([place]) 11/23/2019  . SOB (shortness of breath) 11/23/2019  . Chronic diastolic CHF (congestive heart failure) ([place]) 08/03/2019  . Acute on chronic respiratory failure ([place]) 08/02/2019  . Acute exacerbation of CHF (congestive heart failure) /HFpEF (EF 60%) 06/13/2019  . Normocytic anemia 02/15/2019  . COPD exacerbation ([place]) 09/16/2018  . Hypokalemia 06/26/2018  . Hypomagnesemia 06/26/2018  . Anemia 06/26/2018  . BPH (benign  prostatic hyperplasia) 06/26/2018  . Chronic respiratory failure with hypoxia ([place]) 06/04/2018  . HCAP (healthcare-associated pneumonia) 06/04/2018  . Acute respiratory failure with hypoxia ([place]) 04/14/2018  . Pulmonary nodule 04/14/2018  . CKD (chronic kidney disease) stage 3, GFR 30-59 ml/min 04/14/2018  . Type 2 diabetes mellitus with hyperlipidemia ([place]) 03/12/2018  . BPH with obstruction/lower urinary tract symptoms 08/17/2017  . Left sided numbness  07/22/2017  . TIA (transient ischemic attack) 07/22/2017  . Pneumonia 07/14/2017  . COPD with acute exacerbation ([place]) 07/14/2017  . Constipation 07/10/2017  . Hx of adenomatous colonic polyps 05/01/2017  . Benign hypertension with CKD (chronic kidney disease) stage III   . Chronic pain   . Heme positive stool 10/20/2014  . Epigastric pain 06/19/2014  . Dysphagia 06/19/2014  . AP (abdominal pain) 11/13/2013  . Early satiety 11/13/2013  . COPD (chronic obstructive pulmonary disease) ([place]) 02/22/2013  . Low back pain 11/19/2012  . COLITIS 05/12/2009  . ABDOMINAL PAIN, LEFT LOWER QUADRANT 05/12/2009  . DUODENAL ULCER, HX OF 05/12/2009  . H/o Alcohol abuse- Quit in 2010 05/11/2009  . GERD 05/11/2009  . Diarrhea 05/11/2009  . SPONDYLOSIS, CERVICAL 07/03/2007  . NECK PAIN 07/03/2007    Past Surgical History:  Procedure Laterality Date  . BACK SURGERY  7/05; 5/09    Dr.Hirsch,3 lumbar  X3  . BACK SURGERY    . COLONOSCOPY  Feb 2012   Dr. Gala Romney: normal rectum, pedunculate polyp removed but not recovered  . COLONOSCOPY WITH ESOPHAGOGASTRODUODENOSCOPY (EGD) N/A 11/18/2013   Dr.Rourk- tcs= normal rectum, multipe polyps about the ileocecal valve and distal transverse colon o/w the remainder of the colonic mucosa appeared normal bx= tubular adenoma. EGD= normal esophagus, stomach with scattered erosions mottling, friablility, no ulcer or infiltrating process patent pylorus bx= chronic inflammation. next TCS 11/2016  . COLONOSCOPY WITH PROPOFOL N/A 05/25/2017   Dr. Gala Romney: sigmoid diverticulosis, one 4 mm hyerplastic rectal polyp, ascending colonic AVMs surveillance 2023  . ESOPHAGOGASTRODUODENOSCOPY  1/06   Dr. Volney American esophageal erosions,U-shaped stomach,marked erosions and edema of the bulb without discrete ulcer disease.   . ESOPHAGOGASTRODUODENOSCOPY (EGD) WITH PROPOFOL N/A 05/25/2017   Dr. Gala Romney: reflux esophagitis s/p empiric dilation, normal stomach and duodenum  .  ESOPHAGOGASTRODUODENOSCOPY (EGD) WITH PROPOFOL Left 11/30/2019   LA Grade D esophagitis, small hiatal hernia, one non-bleeding cratered gastric ulcer in prepyloric region of stomach, many non-bleeding cratered duodenal ulcers without stigmata of bleeding, mild luminal narrowing at apex of duodenal bulb but traversed easily. H.pylori serology negative.  Marland Kitchen MALONEY DILATION N/A 05/25/2017   Procedure: Venia Minks DILATION;  Surgeon: Daneil Dolin, MD;  Location: [facility];  Service: Endoscopy;  Laterality: N/A;  . NECK SURGERY  10/2007   PLATE IN NECK  . POLYPECTOMY  05/25/2017   Procedure: POLYPECTOMY;  Surgeon: Daneil Dolin, MD;  Location: [facility] ENDO SUITE;  Service: Endoscopy;;  rectal  . RIGHT/LEFT HEART CATH AND CORONARY ANGIOGRAPHY N/A 12/03/2019   Procedure: RIGHT/LEFT HEART CATH AND CORONARY ANGIOGRAPHY;  Surgeon: Nigel Mormon, MD;  Location: [facility] CV LAB;  Service: Cardiovascular;  Laterality: N/A;  . TRANSURETHRAL RESECTION OF PROSTATE N/A 08/17/2017   Procedure: TRANSURETHRAL RESECTION OF THE PROSTATE (TURP);  Surgeon: Irine Seal, MD;  Location: [facility] ORS;  Service: Urology;  Laterality: N/A;       Family History  Problem Relation Age of Onset  . Cancer Father   . Asthma Mother   . Colon cancer Neg Hx     Social History  Tobacco Use  . Smoking status: Former Smoker    Packs/day: 0.50    Years: 40.00    Pack years: 20.00    Types: Cigarettes    Quit date: 11/11/2014    Years since quitting: 5.2  . Smokeless tobacco: Never Used  Substance Use Topics  . Alcohol use: No    Alcohol/week: 0.0 standard drinks  . Drug use: Not Currently    Types: Marijuana    Comment: occas; denied 01/28/20    Home Medications Prior to Admission medications   Medication Sig Start Date End Date Taking? Authorizing Provider  acetaminophen (TYLENOL) 325 MG tablet Take 2 tablets (650 mg total) by mouth every 6 (six) hours as needed for mild pain (or Fever >/= 101). 06/14/19   Roxan Hockey, MD  albuterol (PROVENTIL) (2.5 MG/3ML) 0.083% nebulizer solution Take 3 mLs (2.5 mg total) by nebulization every 4 (four) hours as needed for wheezing or shortness of breath. 06/14/19   Roxan Hockey, MD  albuterol (VENTOLIN HFA) 108 (90 Base) MCG/ACT inhaler Inhale 2 puffs into the lungs every 4 (four) hours as needed for wheezing or shortness of breath. Do not use with nebulizer 06/14/19   Roxan Hockey, MD  calcium carbonate (TUMS - DOSED IN MG ELEMENTAL CALCIUM) 500 MG chewable tablet Chew 2 tablets (400 mg of elemental calcium total) by mouth 3 (three) times daily as needed for indigestion or heartburn. 12/04/19   Amin, Jeanella Flattery, MD  ciprofloxacin (CIPRO) 500 MG tablet Take 1 tablet (500 mg total) by mouth 2 (two) times daily. One po bid x 7 days 02/02/20   Milton Ferguson, MD  Fluticasone-Umeclidin-Vilant (TRELEGY ELLIPTA) 100-62.5-25 MCG/INH AEPB Inhale 1 puff into the lungs daily. 06/14/19   Roxan Hockey, MD  gabapentin (NEURONTIN) 300 MG capsule Take 1 capsule (300 mg total) by mouth 2 (two) times daily. Pt. Says he is taking twice daily. Patient taking differently: Take 300 mg by mouth 2 (two) times daily.  06/14/19   Roxan Hockey, MD  guaiFENesin (MUCINEX) 600 MG 12 hr tablet Take 1 tablet (600 mg total) by mouth 2 (two) times daily. Patient taking differently: Take 600 mg by mouth daily.  06/14/19 06/13/20  Roxan Hockey, MD  ibuprofen (ADVIL) 800 MG tablet Take 800 mg by mouth daily as needed. 01/08/20   [provider]  linaclotide Rolan Lipa) 72 MCG capsule Take 1 capsule (72 mcg total) by mouth daily before breakfast. 06/14/19   Roxan Hockey, MD  metoprolol tartrate (LOPRESSOR) 25 MG tablet Take 0.5 tablets (12.5 mg total) by mouth 2 (two) times daily. 12/04/19 02/02/20  Damita Lack, MD  metroNIDAZOLE (FLAGYL) 500 MG tablet One pill qid 02/02/20   Milton Ferguson, MD  OXYGEN Inhale 2.5 L into the lungs daily.    [provider]  pantoprazole  (PROTONIX) 40 MG tablet Take 1 tablet (40 mg total) by mouth 2 (two) times daily before a meal. 12/04/19 01/28/20  Amin, Jeanella Flattery, MD  polyethylene glycol powder (MIRALAX) 17 GM/SCOOP powder Take 17 g by mouth daily as needed (FOR CONSTIPATION).  11/14/18   [provider]    Allergies    Patient has no known allergies.  Review of Systems   Review of Systems All other systems are reviewed and are negative for acute change except as noted in the HPI.  Physical Exam Updated Vital Signs BP (!) 171/99 (BP Location: Left Arm)   Pulse 92   Temp 98.7 F (37.1 C) (Oral)   Resp Marland Kitchen)  23   Ht _0  (1.6 m)   Wt 55 kg   SpO2 93%   BMI 21.48 kg/m   Physical Exam Vitals and nursing note reviewed.  Constitutional:      General: He is not in acute distress.    Appearance: He is not ill-appearing.  HENT:     Head: Normocephalic and atraumatic.     Right Ear: Tympanic membrane and external ear normal.     Left Ear: Tympanic membrane and external ear normal.     Nose: Nose normal.     Mouth/Throat:     Mouth: Mucous membranes are moist.     Pharynx: Oropharynx is clear.  Eyes:     General: No scleral icterus.       Right eye: No discharge.        Left eye: No discharge.     Extraocular Movements: Extraocular movements intact.     Conjunctiva/sclera: Conjunctivae normal.     Pupils: Pupils are equal, round, and reactive to light.  Neck:     Vascular: No JVD.  Cardiovascular:     Rate and Rhythm: Normal rate and regular rhythm.     Pulses: Normal pulses.          Radial pulses are 2+ on the right side and 2+ on the left side.     Heart sounds: Normal heart sounds.  Pulmonary:     Comments: Lungs clear to auscultation in all fields. Symmetric chest rise. No wheezing, rales, or rhonchi. Patient is tachypneic. He is speaking in short sentences. Oxygen saturation is 96% on 2 L during examination. Chest:     Chest wall: No tenderness.  Abdominal:     General: Bowel sounds are  normal.     Tenderness: There is no right CVA tenderness or left CVA tenderness.     Comments: Abdomen is soft generalized tenderness. No rigidity, no guarding. No peritoneal signs.  Musculoskeletal:        General: Normal range of motion.     Cervical back: Normal range of motion.     Right lower leg: No edema.     Left lower leg: No edema.  Skin:    General: Skin is warm and dry.     Capillary Refill: Capillary refill takes less than 2 seconds.  Neurological:     Mental Status: He is oriented to person, place, and time.     GCS: GCS eye subscore is 4. GCS verbal subscore is 5. GCS motor subscore is 6.     Comments: Fluent speech, no facial droop.  Psychiatric:        Behavior: Behavior normal.     ED Results / Procedures / Treatments   Labs (all labs ordered are listed, but only abnormal results are displayed) Labs Reviewed  CBC WITH DIFFERENTIAL/PLATELET - Abnormal; Notable for the following components:      Result Value   Hemoglobin 11.3 (*)    HCT 38.6 (*)    MCHC 29.3 (*)    RDW 15.9 (*)    All other components within normal limits  BRAIN NATRIURETIC PEPTIDE - Abnormal; Notable for the following components:   B Natriuretic Peptide 474.0 (*)    All other components within normal limits  COMPREHENSIVE METABOLIC PANEL - Abnormal; Notable for the following components:   Creatinine, Ser 1.47 (*)    GFR calc non Af Amer 50 (*)    GFR calc Af Amer 58 (*)    All other components  within normal limits  URINALYSIS, ROUTINE W REFLEX MICROSCOPIC - Abnormal; Notable for the following components:   Protein, ur 100 (*)    All other components within normal limits  SARS CORONAVIRUS 2 BY RT PCR (HOSPITAL ORDER, [place] [facility])  URINE CULTURE  LIPASE, BLOOD  TROPONIN I (HIGH SENSITIVITY)  TROPONIN I (HIGH SENSITIVITY)    EKG EKG Interpretation  Date/Time:  Wednesday February 12 2020 08:35:15 EDT Ventricular Rate:  94 PR Interval:    QRS Duration: 88 QT  Interval:  378 QTC Calculation: 473 R Axis:   87 Text Interpretation: Sinus rhythm Short PR interval Borderline right axis deviation Borderline low voltage, extremity leads Confirmed by Milton Ferguson [phone]) on 02/12/2020 12:25:34 PM   Radiology DG Chest 2 View  Result Date: 02/12/2020 CLINICAL DATA:  pt reports shortness of breath this am and reports used inhaler and nebulizer with no relief. Hx stroke, HTN, diabetes, COPD, asthma, MI, former smoker EXAM: CHEST - 2 VIEW COMPARISON:  Chest radiograph 02/02/2020 FINDINGS: Stable cardiomediastinal contours. Normal heart size. Chronic bilateral coarsening of the interstitium. Scattered atelectasis. No new focal consolidation. No pneumothorax or pleural effusion. No acute finding in the visualized skeleton. IMPRESSION: No acute cardiopulmonary finding. Chronic bronchitic change, superimposed acute component difficult to exclude. Electronically Signed   By: Audie Pinto M.D.   On: 02/12/2020 10:16   CT ABDOMEN PELVIS W CONTRAST  Result Date: 02/12/2020 CLINICAL DATA:  Abdominal pain EXAM: CT ABDOMEN AND PELVIS WITH CONTRAST TECHNIQUE: Multidetector CT imaging of the abdomen and pelvis was performed using the standard protocol following bolus administration of intravenous contrast. CONTRAST:  143m OMNIPAQUE IOHEXOL 300 MG/ML  SOLN COMPARISON:  Feb 02, 2020 FINDINGS: Lower chest: There is mild scarring in the bases. There are small pleural effusions bilaterally. No lung base edema or airspace opacity. Hepatobiliary: There is hepatic steatosis, most notably in the region of the fissure for the ligamentum teres. Liver otherwise appears unremarkable. Gallbladder wall is not appreciably thickened. There is no biliary duct dilatation. Pancreas: No pancreatic mass or inflammatory focus. Spleen: No splenic lesions are evident. Adrenals/Urinary Tract: Adrenals bilaterally appear unremarkable. Kidneys bilaterally show no evident mass or hydronephrosis on either  side. There is no evident renal or ureteral calculus on either side. Urinary bladder is midline with wall thickness within normal limits. Stomach/Bowel: Rectum is mildly distended with liquid stool in fluid. No rectal wall thickening. No perirectal stranding. There remains mild wall thickening in the mid to distal sigmoid colon without surrounding mesenteric thickening or fluid. No bowel wall thickening is noted elsewhere. No evident bowel obstruction. The terminal ileum appears normal. There is no demonstrable free air or portal venous air. Vascular/Lymphatic: There is no abdominal aortic aneurysm. There is aortic and iliac artery atherosclerosis. Major venous structures appear patent. There is no evident adenopathy in the abdomen or pelvis. Reproductive: Prostate and seminal vesicles are normal in size and contour. No pelvic mass evident. Other: Appendix appears normal. No abscess or ascites evident in the abdomen or pelvis. Musculoskeletal: Multiple foci of degenerative change noted in the lumbar spine. Postoperative screw and plate fixation on the left at L3 and L4 with support hardware intact. No blastic or lytic bone lesions. No intramuscular or abdominal wall lesions are evident. IMPRESSION: 1. Mild wall thickening in the mid to distal sigmoid colon may represent persistent degree of colitis. Rectum mildly distended with liquid stool and air without wall thickening in this area. Bowel otherwise appears unremarkable. No evident bowel  obstruction. 2.  Small pleural effusions bilaterally with bibasilar scarring. 3.  There is a degree of hepatic steatosis. 4.  No abscess in the abdomen or pelvis.  Appendix appears normal. 5. No evident renal or ureteral calculus. No hydronephrosis on either side. Urinary bladder wall thickness normal. 6.  Aortic Atherosclerosis (ICD10-I70.0). Electronically Signed   By: Lowella Grip III M.D.   On: 02/12/2020 12:39    Procedures Procedures (including critical care  time)  Medications Ordered in ED Medications  Ipratropium-Albuterol (COMBIVENT) respimat 1 puff (1 puff Inhalation Given 02/12/20 1120)  predniSONE (DELTASONE) tablet 60 mg (60 mg Oral Given 02/12/20 1104)  iohexol (OMNIPAQUE) 300 MG/ML solution 100 mL (100 mLs Intravenous Contrast Given 02/12/20 1149)    ED Course  I have reviewed the triage vital signs and the nursing notes.  Pertinent labs & imaging results that were available during my care of the patient were reviewed by me and considered in my medical decision making (see chart for details).    MDM Rules/Calculators/A&P                      History provided by patient with additional history obtained from chart review.    Patient seen and examined. Patient presents awake, alert, hemodynamically stable, afebrile, non toxic. On exam his oxygen saturation is 96% on his normal 4L  nasal cannula, increased from his typical 2L. His lungs are clear to auscultation in all fields however he is tachypneic and speaking in short sentences. He has generalized abdominal tenderness without any peritoneal signs, no rigidity or guarding. No CVA tenderness. Patient given Po prednisone and combivent. Patient ambulated on 2L with oxygen saturation of 100% with RR 40, once in back bed oxygen saturation 84% now on 4L.  EKG without obvious ischemia. CBC shows no leukocytosis, hemoglobin consistent with baseline. CMP shows no severe electrolyte derangement, creatinine is mildly bumped at 1.47 although this does appear close to his baseline based on previous labs. Lipase is within normal range.BNP elevated at 474. Delta tropnin flat, ACS unlikely. UA without signs of infection. I viewed pt's chest xray and it shows patient has chronic bronchitis with radiologist comments on acute component difficult to exclude. CT A/P shows possible persistent colitis and hepatic steatosis. Findings and plan of care discussed with supervising physician Dr. Roderic Palau who agrees with plan  for admission for COPD exacerbation. Spoke with Dr. Carles Collet with hospitalist service who agrees to assume care of patient and bring into the hospital for further evaluation and management.     Portions of this note were generated with Lobbyist. Dictation errors may occur despite best attempts at proofreading.   Final Clinical Impression(s) / ED Diagnoses Final diagnoses:  COPD exacerbation [facility])    Rx / DC Orders ED Discharge Orders    None       Flint Melter 02/12/20 1340    Milton Ferguson, MD 02/13/20 1550

## 2020-02-12 NOTE — Patient Outreach (Signed)
[facility]) Care Management  02/12/2020  ELEUTERIO DOLLAR 1955-12-13 [id number]    [facility] outreach to complex care patient  Date of Admission:11/23/19 Diagnosis:Non ST elevation myocardial infarction (NSTEMI), acute on chronic hypoxic respiratory failure (multifactorial) pulmonary arterial hypertension (moderate-WHO Grp3), acutecongestive Heart Failure (CHF), esophagitis withnon bleedinggastric/duodenal ulcers/duodenal stenosis without obstruction Date of Discharge:12/04/19 Facility:[facility]  Mr Happ was initially referred to [facility] on 10/07/19 as an [facility] referral for assistance with finding an affordable housing.  Referral [facility] [facility] Referral Reason:Medium referral (within 10 business day outreach) - [facility], phone [phone]** Unsure if member is still active with [facility], Mentions that he needs assistance finding an affordable place with central air and heat, He lives in a house with a fireplace and is unable to use the oil heat due to his oxygen, Is there a list that member can find or a list that can be mailed to member? Discipline requested [facility] LSW Insurance:[employer] medicare   Outreach successful BUT  Mr Mishkin is able to verify HIPAA ([place] and Accountability Act) identifiers, date of birth (DOB) and address   Mr Custis informs [facility] RN CM he is in the hospital He reports arriving this morning related to sob and GI concerns He states he has been informed he would be kept in the hospital [facility] RN CM spoke with Mr Bruni about [facility] follow services after hospital discharge and he agreed to [facility] RN CM follow up for transition of care  Plans [facility] RN CM will follow up with Mr Mangrum within the next 4-7 business days or when notified by [facility] hospital liaison of d/c plans  [facility] hospital liaison updated of pt admission vis Epic in basket  Routed note to MD   [place]. [name], RN, BSN, [place] Coordinator Office number [phone] Mobile number [phone]  Main [facility] number [phone] Fax number [phone]

## 2020-02-12 NOTE — ED Triage Notes (Addendum)
Per EMS, pt reports shortness of breath this am and reports used inhaler and nebulizer with no relief. Pt is on 2 liters all the time at home. Pt reports mid sternal chest pain. nad noted. cbg en route 113. Pt reports seen for same on 5/23.

## 2020-02-12 NOTE — TOC Initial Note (Signed)
Transition of Care [facility]) - Initial/Assessment Note   Patient Details  Name: Bryan [name] MRN: [id number] Date of Birth: Nov 19, 1955  Transition of Care [facility]) CM/SW Contact:    Sherie Don, LCSW Phone Number: 02/12/2020, 8:32 PM  Clinical Narrative: Patient is a 64 year old male admitted for acute on chronic diastolic CHF, benign hypertension with CKD, COPD with acute exacerbation, and acute respiratory failure with hypoxia. TOC received consult for CHF screening. CSW met with patient in ED to complete screening. Per patient, he lives in a single family home with his son. He currently receives home O2 from [employer] and has a nebulizer and walker.  CSW asked patient if he follows a heart healthy diet. Patient reported he does not, but does restrict his salt intake. Patient reported he has not been restricting his fluids. He does, however, weigh himself most days. Patient reported he was unaware that he should follow up with a PCP if he weighs a few pounds more than the previous day's weight. TOC to follow for possible needs.  Expected Discharge Plan: Home/Self Care Barriers to Discharge: Continued Medical Work up  Patient Goals and CMS Choice Patient states their goals for this hospitalization and ongoing recovery are:: Return home  Expected Discharge Plan and Services Expected Discharge Plan: Home/Self Care In-house Referral: Clinical Social Work Living arrangements for the past 2 months: Single Family Home  Prior Living Arrangements/Services Living arrangements for the past 2 months: Single Family Home Lives with:: Adult Children([employer] and exercise psychologist (son)) Patient language and need for interpreter reviewed:: Yes Do you feel safe going back to the place where you live?: Yes      Need for Family Participation in Patient Care: No (Comment) Care giver support system in place?: Yes (comment) Current home services: DME(Home O2 (from [employer]), nebulizer, walker) Criminal Activity/Legal Involvement  Pertinent to Current Situation/Hospitalization: No - Comment as needed  Activities of Daily Living Home Assistive Devices/Equipment: Cane (specify quad or straight) ADL Screening (condition at time of admission) Patient's cognitive ability adequate to safely complete daily activities?: Yes Is the patient deaf or have difficulty hearing?: No Does the patient have difficulty seeing, even when wearing glasses/contacts?: No Does the patient have difficulty concentrating, remembering, or making decisions?: No Patient able to express need for assistance with ADLs?: Yes Does the patient have difficulty dressing or bathing?: No Independently performs ADLs?: Yes (appropriate for developmental age) Does the patient have difficulty walking or climbing stairs?: No Weakness of Legs: None Weakness of Arms/Hands: None  Emotional Assessment Appearance:: Appears stated age Attitude/Demeanor/Rapport: Engaged Affect (typically observed): Appropriate, Accepting Orientation: : Oriented to Self, Oriented to Place, Oriented to  Time, Oriented to Situation Alcohol / Substance Use: Not Applicable Psych Involvement: No (comment)  Admission diagnosis:  Acute on chronic diastolic CHF (congestive heart failure) ([place]) [I50.33] Patient Active Problem List   Diagnosis Date Noted  . Acute on chronic diastolic CHF (congestive heart failure) ([place]) 02/12/2020  . PUD (peptic ulcer disease) 01/28/2020  . Nonobstructive atherosclerosis of coronary artery   . Pulmonary hypertension due to COPD ([place])   . NSTEMI (non-ST elevated myocardial infarction) ([place]) 11/25/2019  . Non-insulin dependent type 2 diabetes mellitus ([place])   . Mixed hyperlipidemia   . Non-ST elevation (NSTEMI) myocardial infarction ([place]) 11/24/2019  . Oxygen dependent 11/24/2019  . Former smoker 11/24/2019  . Coronary artery calcification seen on CAT scan   . Angina pectoris ([place]) 11/23/2019  . SOB (shortness of breath) 11/23/2019  . Chronic diastolic  CHF (  congestive heart failure) ([place]) 08/03/2019  . Acute on chronic respiratory failure ([place]) 08/02/2019  . Acute exacerbation of CHF (congestive heart failure) /HFpEF (EF 60%) 06/13/2019  . Normocytic anemia 02/15/2019  . COPD exacerbation ([place]) 09/16/2018  . Hypokalemia 06/26/2018  . Hypomagnesemia 06/26/2018  . Anemia 06/26/2018  . BPH (benign prostatic hyperplasia) 06/26/2018  . Chronic respiratory failure with hypoxia ([place]) 06/04/2018  . HCAP (healthcare-associated pneumonia) 06/04/2018  . Acute respiratory failure with hypoxia ([place]) 04/14/2018  . Pulmonary nodule 04/14/2018  . CKD (chronic kidney disease) stage 3, GFR 30-59 ml/min 04/14/2018  . Type 2 diabetes mellitus with hyperlipidemia ([place]) 03/12/2018  . BPH with obstruction/lower urinary tract symptoms 08/17/2017  . Left sided numbness 07/22/2017  . TIA (transient ischemic attack) 07/22/2017  . Pneumonia 07/14/2017  . COPD with acute exacerbation ([place]) 07/14/2017  . Constipation 07/10/2017  . Hx of adenomatous colonic polyps 05/01/2017  . Benign hypertension with CKD (chronic kidney disease) stage III   . Chronic pain   . Heme positive stool 10/20/2014  . Epigastric pain 06/19/2014  . Dysphagia 06/19/2014  . AP (abdominal pain) 11/13/2013  . Early satiety 11/13/2013  . COPD (chronic obstructive pulmonary disease) ([place]) 02/22/2013  . Low back pain 11/19/2012  . COLITIS 05/12/2009  . ABDOMINAL PAIN, LEFT LOWER QUADRANT 05/12/2009  . DUODENAL ULCER, HX OF 05/12/2009  . H/o Alcohol abuse- Quit in 2010 05/11/2009  . GERD 05/11/2009  . Diarrhea 05/11/2009  . SPONDYLOSIS, CERVICAL 07/03/2007  . NECK PAIN 07/03/2007   PCP:  Rosita Fire, MD Pharmacy:   [facility] [place] [address] Phone: [phone] Fax: [phone]  Readmission Risk Interventions Readmission Risk Prevention Plan 11/25/2019   Transportation Screening Complete  PCP or Specialist Appt within 3-5 Days Complete  HRI or Home Care Consult Complete  Social Work Consult for [place] Planning/Counseling Complete  Palliative Care Screening Not Applicable  Medication Review Press photographer) Complete  Some recent data might be hidden

## 2020-02-12 NOTE — H&P (Signed)
History and Physical  Bryan [name]:[id number] DOB: 01-02-1956 DOA: 02/12/2020   PCP: Rosita Fire, MD   Patient coming from: Home  Chief Complaint: sob  HPI:  Bryan [name] is a 64 y.o. male with medical history of chronic respiratory failure on 2.5 L, COPD, chronic back pain, chronic abdominal pain, coronary artery disease, CKD stage III, diastolic CHF, hypertension, diet-controlled diabetes mellitus and TIA presenting with 1 week history of shortness of breath that has been worsening over the past 3 to 4 days.  He has subjective fevers without any chills, nausea, vomiting, diarrhea, hematochezia, melena.  He has had orthopnea type symptoms.  The patient has chronic abdominal pain which she states has not changed.  There is no dysuria or hematuria.  The patient also states that he has chest pain " 24/7" that is constant without any change.  He is unable to relate to me any exacerbating or alleviating factors.  The patient tried to use his home nebulizers without improvement.  As result he presented for further evaluation.  Notably, the patient had a recent hospital admission from 11/23/2019 to 12/04/2019 secondary to acute diastolic CHF.  He also EGD at that time secondary to nausea and vomiting which revealed nonbleeding gastric and duodenal ulcers with duodenal stenosis without obstruction. In the emergency department, the patient was afebrile hemodynamically stable with oxygen saturation 92-94% on 2.5 L.  BMP, LFTs, and CBC were essentially unremarkable with his serum creatinine at his usual baseline.  Hemoglobin was 11.3 which is at his usual baseline.  CT abdomen/pelvis showed mild thickening of the mid to distal sigmoid colon With his rectum mildly distended.  UA was negative for pyuria.  Chest x-ray showed increased interstitial markings.  Assessment/Plan:  Acute on chronic respiratory failure with hypoxia -Secondary to CHF and COPD exacerbation -Currently stable on 3L -Wean oxygen as  tolerated back to baseline  Acute on chronic diastolic CHF -Start IV furosemide -Daily weights -Accurate I's and O's -11/24/2019 echo EF 55 to 60%, no WMA, moderate TR  Sigmoid colitis -Suspect ischemic colitis -Start ceftriaxone and metronidazole -GI consult -Full liquid diet for now  Gastritis/esophagitis -Continue PPI -11/30/2019 EGD--grade D esophagitis, gastritis, nonbleeding gastric and duodenal ulcers, mild proximal duodenal stenosis without obstruction  COPD exacerbation -Mild -Start Pulmicort -Start duo nebs -Continue prednisone p.o.  CKD stage IIIa -Baseline creatinine 1.2-1.4 -BMP monitor with diuresis  Diabetes mellitus type 2, diet controlled -11/24/2019 hemoglobin A1c 6.7  Essential hypertension -Continue metoprolol tartrate  Chronic abdominal pain -The patient follows [facility] -He has had an extensive previous work-up including EGD, colonoscopy, and numerous CT abdomen/pelvis -Has been attributable to multifactorial etiology including constipation, esophagitis, musculoskeletal, and a degree of IBS  Atypical chest pain -EKG--no concerning STT changes -troponins neg     Past Medical History:  Diagnosis Date  . Acid reflux   . Arthritis   . Asthma   . Chronic pain    with leg and back pain (disc problem)  . COPD (chronic obstructive pulmonary disease) ([place])   . Diabetes mellitus without complication ([place])   . Headache    HX OF  . History of upper GI x-ray series    to follow showed large duodenal ulcer H pylori serologies were negative  . Hypertension   . NSTEMI (non-ST elevated myocardial infarction) ([place]) 11/2019  . Pneumonia    07/17/17  . Pre-diabetes   . Stroke [facility])    TIA MINI STROKE  . Tubular adenoma  Past Surgical History:  Procedure Laterality Date  . BACK SURGERY  7/05; 5/09    Dr.Hirsch,3 lumbar  X3  . BACK SURGERY    . COLONOSCOPY  Feb 2012   Dr. Gala Romney: normal rectum, pedunculate polyp removed but not recovered  .  COLONOSCOPY WITH ESOPHAGOGASTRODUODENOSCOPY (EGD) N/A 11/18/2013   Dr.Rourk- tcs= normal rectum, multipe polyps about the ileocecal valve and distal transverse colon o/w the remainder of the colonic mucosa appeared normal bx= tubular adenoma. EGD= normal esophagus, stomach with scattered erosions mottling, friablility, no ulcer or infiltrating process patent pylorus bx= chronic inflammation. next TCS 11/2016  . COLONOSCOPY WITH PROPOFOL N/A 05/25/2017   Dr. Gala Romney: sigmoid diverticulosis, one 4 mm hyerplastic rectal polyp, ascending colonic AVMs surveillance 2023  . ESOPHAGOGASTRODUODENOSCOPY  1/06   Dr. Volney American esophageal erosions,U-shaped stomach,marked erosions and edema of the bulb without discrete ulcer disease.   . ESOPHAGOGASTRODUODENOSCOPY (EGD) WITH PROPOFOL N/A 05/25/2017   Dr. Gala Romney: reflux esophagitis s/p empiric dilation, normal stomach and duodenum  . ESOPHAGOGASTRODUODENOSCOPY (EGD) WITH PROPOFOL Left 11/30/2019   LA Grade D esophagitis, small hiatal hernia, one non-bleeding cratered gastric ulcer in prepyloric region of stomach, many non-bleeding cratered duodenal ulcers without stigmata of bleeding, mild luminal narrowing at apex of duodenal bulb but traversed easily. H.pylori serology negative.  Marland Kitchen MALONEY DILATION N/A 05/25/2017   Procedure: Venia Minks DILATION;  Surgeon: Daneil Dolin, MD;  Location[facility];  Service: Endoscopy;  Laterality: N/A;  . NECK SURGERY  10/2007   PLATE IN NECK  . POLYPECTOMY  05/25/2017   Procedure: POLYPECTOMY;  Surgeon: Daneil Dolin, MD;  Location: [facility];  Service: Endoscopy;;  rectal  . RIGHT/LEFT HEART CATH AND CORONARY ANGIOGRAPHY N/A 12/03/2019   Procedure: RIGHT/LEFT HEART CATH AND CORONARY ANGIOGRAPHY;  Surgeon: Nigel Mormon, MD;  Location: [facility];  Service: Cardiovascular;  Laterality: N/A;  . TRANSURETHRAL RESECTION OF PROSTATE N/A 08/17/2017   Procedure: TRANSURETHRAL RESECTION OF THE PROSTATE (TURP);  Surgeon:  Irine Seal, MD;  Location: [facility];  Service: Urology;  Laterality: N/A;   Social History:  reports that he quit smoking about 5 years ago. His smoking use included cigarettes. He has a 20.00 pack-year smoking history. He has never used smokeless tobacco. He reports previous drug use. Drug: Marijuana. He reports that he does not drink alcohol.   Family History  Problem Relation Age of Onset  . Cancer Father   . Asthma Mother   . Colon cancer Neg Hx      No Known Allergies   Prior to Admission medications   Medication Sig Start Date End Date Taking? Authorizing Provider  acetaminophen (TYLENOL) 500 MG tablet Take 500 mg by mouth daily.   Yes [provider]  albuterol (PROVENTIL) (2.5 MG/3ML) 0.083% nebulizer solution Take 3 mLs (2.5 mg total) by nebulization every 4 (four) hours as needed for wheezing or shortness of breath. 06/14/19  Yes Emokpae, Courage, MD  albuterol (VENTOLIN HFA) 108 (90 Base) MCG/ACT inhaler Inhale 2 puffs into the lungs every 4 (four) hours as needed for wheezing or shortness of breath. Do not use with nebulizer 06/14/19  Yes Emokpae, Courage, MD  calcium carbonate (TUMS - DOSED IN MG ELEMENTAL CALCIUM) 500 MG chewable tablet Chew 2 tablets (400 mg of elemental calcium total) by mouth 3 (three) times daily as needed for indigestion or heartburn. 12/04/19  Yes Amin, Ankit Chirag, MD  Fluticasone-Umeclidin-Vilant (TRELEGY ELLIPTA) 100-62.5-25 MCG/INH AEPB Inhale 1 puff into the lungs daily.  06/14/19  Yes Emokpae, Courage, MD  gabapentin (NEURONTIN) 300 MG capsule Take 1 capsule (300 mg total) by mouth 2 (two) times daily. Pt. Says he is taking twice daily. Patient taking differently: Take 300 mg by mouth 2 (two) times daily.  06/14/19  Yes Emokpae, Courage, MD  guaiFENesin (MUCINEX) 600 MG 12 hr tablet Take 1 tablet (600 mg total) by mouth 2 (two) times daily. Patient taking differently: Take 600 mg by mouth daily.  06/14/19 06/13/20 Yes Roxan Hockey, MD    linaclotide (LINZESS) 72 MCG capsule Take 1 capsule (72 mcg total) by mouth daily before breakfast. Patient taking differently: Take 72 mcg by mouth daily as needed.  06/14/19  Yes Emokpae, Courage, MD  metoprolol tartrate (LOPRESSOR) 25 MG tablet Take 0.5 tablets (12.5 mg total) by mouth 2 (two) times daily. 12/04/19 02/12/20 Yes Amin, Ankit Chirag, MD  OXYGEN Inhale 2.5 L into the lungs daily.   Yes [provider]  pantoprazole (PROTONIX) 40 MG tablet Take 1 tablet (40 mg total) by mouth 2 (two) times daily before a meal. 12/04/19 02/12/20 Yes Amin, Ankit Chirag, MD  polyethylene glycol powder (MIRALAX) 17 GM/SCOOP powder Take 17 g by mouth daily as needed (FOR CONSTIPATION).  11/14/18  Yes [provider]    Review of Systems:  Constitutional:  No weight loss, night sweats, Fevers, chills, fatigue.  Head&Eyes: No headache.  No vision loss.  No eye pain or scotoma ENT:  No Difficulty swallowing,Tooth/dental problems,Sore throat,  No ear ache, post nasal drip,  Cardio-vascular:  No Orthopnea, PND, swelling in lower extremities,  dizziness, palpitations  GI:  No  diarrhea, loss of appetite, hematochezia, melena, heartburn, indigestion, Resp:   No cough. No coughing up of blood .No wheezing.No chest wall deformity  Skin:  no rash or lesions.  GU:  no dysuria, change in color of urine, no urgency or frequency. No flank pain.  Musculoskeletal:  No joint pain or swelling. No decreased range of motion. No back pain.  Psych:  No change in mood or affect. No depression or anxiety. Neurologic: No headache, no dysesthesia, no focal weakness, no vision loss. No syncope  Physical Exam: Vitals:   02/12/20 1324 02/12/20 1330 02/12/20 1400 02/12/20 1430  BP:  (!) 164/98 (!) 185/99 (!) 157/89  Pulse:   (!) 102 89  Resp: (!) 28 (!) 32 (!) 22 (!) 26  Temp:      TempSrc:      SpO2:   91% 92%  Weight:      Height:       General:  A&O x 3, NAD, nontoxic,  pleasant/cooperative Head/Eye: No conjunctival hemorrhage, no icterus, [place]/AT, No nystagmus ENT:  No icterus,  No thrush, good dentition, no pharyngeal exudate Neck:  No masses, no lymphadenpathy, no bruits CV:  RRR, no rub, no gallop, no S3 +JVD Lung:  Bibasilar crackles. No wheeze Abdomen: soft/NT, +BS, nondistended, no peritoneal signs Ext: No cyanosis, No rashes, No petechiae, No lymphangitis, No edema Neuro: CNII-XII intact, strength 4/5 in bilateral upper and lower extremities, no dysmetria  Labs on Admission:  Basic Metabolic Panel: Recent Labs  Lab 02/12/20 0917  NA 140  K 4.2  CL 101  CO2 29  GLUCOSE 97  BUN 11  CREATININE 1.47*  CALCIUM 9.0   Liver Function Tests: Recent Labs  Lab 02/12/20 0917  AST 18  ALT 13  ALKPHOS 79  BILITOT 0.6  PROT 7.4  ALBUMIN 3.5   Recent Labs  Lab 02/12/20  0950  LIPASE 38   No results for input(s): AMMONIA in the last 168 hours. CBC: Recent Labs  Lab 02/12/20 0917  WBC 8.1  NEUTROABS 6.6  HGB 11.3*  HCT 38.6*  MCV 89.4  PLT 367   Coagulation Profile: No results for input(s): INR, PROTIME in the last 168 hours. Cardiac Enzymes: No results for input(s): CKTOTAL, CKMB, CKMBINDEX, TROPONINI in the last 168 hours. BNP: Invalid input(s): POCBNP CBG: No results for input(s): GLUCAP in the last 168 hours. Urine analysis:    Component Value Date/Time   COLORURINE YELLOW 02/12/2020 [place] 02/12/2020 1158   LABSPEC 1.010 02/12/2020 1158   PHURINE 6.0 02/12/2020 1158   GLUCOSEU NEGATIVE 02/12/2020 1158   HGBUR NEGATIVE 02/12/2020 1158   BILIRUBINUR NEGATIVE 02/12/2020 1158   KETONESUR NEGATIVE 02/12/2020 1158   PROTEINUR 100 (A) 02/12/2020 1158   UROBILINOGEN 0.2 07/08/2015 1314   NITRITE NEGATIVE 02/12/2020 1158   LEUKOCYTESUR NEGATIVE 02/12/2020 1158   Sepsis Labs: _0 (procalcitonin:4,lacticidven:4) ) Recent Results (from the past 240 hour(s))  SARS Coronavirus 2 by RT PCR (hospital  order, performed in [facility] hospital lab) Nasopharyngeal Nasopharyngeal Swab     Status: None   Collection Time: 02/12/20 11:11 AM   Specimen: Nasopharyngeal Swab  Result Value Ref Range Status   SARS Coronavirus 2 NEGATIVE NEGATIVE Final    Comment: (NOTE) SARS-CoV-2 target nucleic acids are NOT DETECTED. The SARS-CoV-2 RNA is generally detectable in upper and lower respiratory specimens during the acute phase of infection. The lowest concentration of SARS-CoV-2 viral copies this assay can detect is 250 copies / mL. A negative result does not preclude SARS-CoV-2 infection and should not be used as the sole basis for treatment or other patient management decisions.  A negative result may occur with improper specimen collection / handling, submission of specimen other than nasopharyngeal swab, presence of viral mutation(s) within the areas targeted by this assay, and inadequate number of viral copies (<250 copies / mL). A negative result must be combined with clinical observations, patient history, and epidemiological information. Fact Sheet for Patients:   StrictlyIdeas.no Fact Sheet for Healthcare Providers: BankingDealers.co.za This test is not yet approved or cleared  by the [place] FDA and has been authorized for detection and/or diagnosis of SARS-CoV-2 by FDA under an Emergency Use Authorization (EUA).  This EUA will remain in effect (meaning this test can be used) for the duration of the COVID-19 declaration under Section 564(b)(1) of the Act, 21 U.S.C. section 360bbb-3(b)(1), unless the authorization is terminated or revoked sooner. Performed at [facility], [address], [place], [place] [address]      Radiological Exams on Admission: DG Chest 2 View  Result Date: 02/12/2020 CLINICAL DATA:  pt reports shortness of breath this am and reports used inhaler and nebulizer with no relief. Hx stroke, HTN, diabetes, COPD,  asthma, MI, former smoker EXAM: CHEST - 2 VIEW COMPARISON:  Chest radiograph 02/02/2020 FINDINGS: Stable cardiomediastinal contours. Normal heart size. Chronic bilateral coarsening of the interstitium. Scattered atelectasis. No new focal consolidation. No pneumothorax or pleural effusion. No acute finding in the visualized skeleton. IMPRESSION: No acute cardiopulmonary finding. Chronic bronchitic change, superimposed acute component difficult to exclude. Electronically Signed   By: Audie Pinto M.D.   On: 02/12/2020 10:16   CT ABDOMEN PELVIS W CONTRAST  Result Date: 02/12/2020 CLINICAL DATA:  Abdominal pain EXAM: CT ABDOMEN AND PELVIS WITH CONTRAST TECHNIQUE: Multidetector CT imaging of the abdomen and pelvis was performed using the  standard protocol following bolus administration of intravenous contrast. CONTRAST:  162m OMNIPAQUE IOHEXOL 300 MG/ML  SOLN COMPARISON:  Feb 02, 2020 FINDINGS: Lower chest: There is mild scarring in the bases. There are small pleural effusions bilaterally. No lung base edema or airspace opacity. Hepatobiliary: There is hepatic steatosis, most notably in the region of the fissure for the ligamentum teres. Liver otherwise appears unremarkable. Gallbladder wall is not appreciably thickened. There is no biliary duct dilatation. Pancreas: No pancreatic mass or inflammatory focus. Spleen: No splenic lesions are evident. Adrenals/Urinary Tract: Adrenals bilaterally appear unremarkable. Kidneys bilaterally show no evident mass or hydronephrosis on either side. There is no evident renal or ureteral calculus on either side. Urinary bladder is midline with wall thickness within normal limits. Stomach/Bowel: Rectum is mildly distended with liquid stool in fluid. No rectal wall thickening. No perirectal stranding. There remains mild wall thickening in the mid to distal sigmoid colon without surrounding mesenteric thickening or fluid. No bowel wall thickening is noted elsewhere. No evident  bowel obstruction. The terminal ileum appears normal. There is no demonstrable free air or portal venous air. Vascular/Lymphatic: There is no abdominal aortic aneurysm. There is aortic and iliac artery atherosclerosis. Major venous structures appear patent. There is no evident adenopathy in the abdomen or pelvis. Reproductive: Prostate and seminal vesicles are normal in size and contour. No pelvic mass evident. Other: Appendix appears normal. No abscess or ascites evident in the abdomen or pelvis. Musculoskeletal: Multiple foci of degenerative change noted in the lumbar spine. Postoperative screw and plate fixation on the left at L3 and L4 with support hardware intact. No blastic or lytic bone lesions. No intramuscular or abdominal wall lesions are evident. IMPRESSION: 1. Mild wall thickening in the mid to distal sigmoid colon may represent persistent degree of colitis. Rectum mildly distended with liquid stool and air without wall thickening in this area. Bowel otherwise appears unremarkable. No evident bowel obstruction. 2.  Small pleural effusions bilaterally with bibasilar scarring. 3.  There is a degree of hepatic steatosis. 4.  No abscess in the abdomen or pelvis.  Appendix appears normal. 5. No evident renal or ureteral calculus. No hydronephrosis on either side. Urinary bladder wall thickness normal. 6.  Aortic Atherosclerosis (ICD10-I70.0). Electronically Signed   By: WLowella GripIII M.D.   On: 02/12/2020 12:39    EKG: Independently reviewed. Sinus, nonspecific TWI    Time spent:60 minutes Code Status:   FULL Family Communication:  No Family at bedside Disposition Plan: expect 2-3 day hospitalization Consults called: none  DVT Prophylaxis: [place] Heparin    DOrson Eva DO  [facility] Pager [phone] If 7PM-7AM, please contact night-coverage [email or web address] Password [email or web address] 02/12/2020, 2:35 PM

## 2020-02-13 ENCOUNTER — Encounter (HOSPITAL_COMMUNITY): Payer: Self-pay | Admitting: Internal Medicine

## 2020-02-13 DIAGNOSIS — R1011 Right upper quadrant pain: Secondary | ICD-10-CM

## 2020-02-13 DIAGNOSIS — R933 Abnormal findings on diagnostic imaging of other parts of digestive tract: Secondary | ICD-10-CM

## 2020-02-13 DIAGNOSIS — R1032 Left lower quadrant pain: Secondary | ICD-10-CM

## 2020-02-13 LAB — HEMOGLOBIN A1C
Hgb A1c MFr Bld: 6.7 % — ABNORMAL HIGH (ref 4.8–5.6)
Mean Plasma Glucose: 145.59 mg/dL

## 2020-02-13 LAB — URINE CULTURE: Culture: NO GROWTH

## 2020-02-13 LAB — BASIC METABOLIC PANEL
Anion gap: 11 (ref 5–15)
BUN: 19 mg/dL (ref 8–23)
CO2: 31 mmol/L (ref 22–32)
Calcium: 8.5 mg/dL — ABNORMAL LOW (ref 8.9–10.3)
Chloride: 97 mmol/L — ABNORMAL LOW (ref 98–111)
Creatinine, Ser: 1.64 mg/dL — ABNORMAL HIGH (ref 0.61–1.24)
GFR calc Af Amer: 51 mL/min — ABNORMAL LOW (ref >60)
GFR calc non Af Amer: 44 mL/min — ABNORMAL LOW (ref >60)
Glucose, Bld: 98 mg/dL (ref 70–99)
Potassium: 3.6 mmol/L (ref 3.5–5.1)
Sodium: 139 mmol/L (ref 135–145)

## 2020-02-13 MED ORDER — FUROSEMIDE 40 MG PO TABS
40.0000 mg | ORAL_TABLET | Freq: Every day | ORAL | Status: DC
  Administered 2020-02-14: 40 mg via ORAL
  Filled 2020-02-13: qty 1

## 2020-02-13 MED ORDER — POLYETHYLENE GLYCOL 3350 17 G PO PACK: 17.0000 g | PACK | Freq: Every day | ORAL | Status: DC | PRN

## 2020-02-13 NOTE — Progress Notes (Signed)
PROGRESS NOTE  DAY DEERY [facility]:[id number] DOB: 04-22-1956 DOA: 02/12/2020 PCP: Rosita Fire, MD  Brief History:  64 y.o. male with medical history of chronic respiratory failure on 2.5 L, COPD, chronic back pain, chronic abdominal pain, coronary artery disease, CKD stage III, diastolic CHF, hypertension, diet-controlled diabetes mellitus and TIA presenting with 1 week history of shortness of breath that has been worsening over the past 3 to 4 days.  He has subjective fevers without any chills, nausea, vomiting, diarrhea, hematochezia, melena.  He has had orthopnea type symptoms.  The patient has chronic abdominal pain which she states has not changed.  He did complain of loose stools for the past week without hematochezia or melena.  There is no dysuria or hematuria.  The patient also states that he has chest pain " 24/7" that is constant without any change.  He is unable to relate to me any exacerbating or alleviating factors.  The patient tried to use his home nebulizers without improvement.  As result, he presented for further evaluation.  Notably, the patient had a recent hospital admission from 11/23/2019 to 12/04/2019 secondary to acute diastolic CHF.  He also EGD at that time secondary to nausea and vomiting which revealed nonbleeding gastric and duodenal ulcers with duodenal stenosis without obstruction. In the emergency department, the patient was afebrile hemodynamically stable with oxygen saturation 92-94% on 2.5 L.  BMP, LFTs, and CBC were essentially unremarkable with his serum creatinine at his usual baseline.  Hemoglobin was 11.3 which is at his usual baseline.  CT abdomen/pelvis showed mild thickening of the mid to distal sigmoid colon With his rectum mildly distended.  UA was negative for pyuria.  Chest x-ray showed increased interstitial markings.  Assessment/Plan: Acute on chronic respiratory failure with hypoxia -Secondary to CHF and COPD exacerbation -Currently stable on  3L -Wean oxygen as tolerated back to baseline  Acute on chronic diastolic CHF -Continue IV furosemide -Daily weights -Accurate I's and O's -11/24/2019 echo EF 55 to 60%, no WMA, moderate TR  Sigmoid colitis -Suspect ischemic colitis -Continue ceftriaxone and metronidazole -GI consult -Full liquid diet started but pt wanted "real food">>carb modified -check stool pathogen panel  Gastritis/esophagitis -Continue PPI bid -11/30/2019 EGD--grade D esophagitis, gastritis, nonbleeding gastric and duodenal ulcers, mild proximal duodenal stenosis without obstruction  COPD exacerbation -Continue Pulmicort -Continue duo nebs -Continue prednisone p.o.  CKD stage IIIa -Baseline creatinine 1.2-1.4 -BMP monitor with diuresis  Diabetes mellitus type 2, diet controlled -11/24/2019 hemoglobin A1c 6.7  Essential hypertension -Continue metoprolol tartrate  Chronic abdominal pain -The patient follows [facility] -He has had an extensive previous work-up including EGD, colonoscopy, and numerous CT abdomen/pelvis -Has been attributable to multifactorial etiology including constipation, esophagitis, musculoskeletal, and a degree of IBS  Atypical chest pain -EKG--no concerning STT changes -troponins neg      Status is: Inpatient  Remains inpatient appropriate because:IV treatments appropriate due to intensity of illness or inability to take PO   Dispo: The patient is from: Home              Anticipated d/c is to: Home              Anticipated d/c date is: 1 day              Patient currently is not medically stable to d/c.         Family Communication:  no Family at bedside  Consultants:  GI  Code Status:  FULL   DVT Prophylaxis:  [place] Heparin    Procedures: As Listed in Progress Note Above  Antibiotics: Ceftriaxone 6/2>>> Flagyl 6/2>>>     Subjective: Pt states he is breathing better, but complains of dyspnea on exertion.  He is able to lay flat now.   Denies f/c, cp, n/v/d.    Objective: Vitals:   02/13/20 0047 02/13/20 0457 02/13/20 0500 02/13/20 0738  BP: 128/75 136/90    Pulse: 74 83    Resp: 20 20    Temp: 98.7 F (37.1 C) 98.2 F (36.8 C)    TempSrc: Oral Oral    SpO2: 96% 94%  92%  Weight:   55.1 kg   Height:        Intake/Output Summary (Last 24 hours) at 02/13/2020 0851 Last data filed at 02/13/2020 0630 Gross per 24 hour  Intake 291.71 ml  Output 1675 ml  Net -1383.29 ml   Weight change:  Exam:   General:  Pt is alert, follows commands appropriately, not in acute distress  HEENT: No icterus, No thrush, No neck mass, [place]/AT  Cardiovascular: RRR, S1/S2, no rubs, no gallops  Respiratory: bibasilar crackles. No wheeze  Abdomen: Soft/+BS, non tender, non distended, no guarding  Extremities: No edema, No lymphangitis, No petechiae, No rashes, no synovitis   Data Reviewed: I have personally reviewed following labs and imaging studies Basic Metabolic Panel: Recent Labs  Lab 02/12/20 0917 02/13/20 0549  NA 140 139  K 4.2 3.6  CL 101 97*  CO2 29 31  GLUCOSE 97 98  BUN 11 19  CREATININE 1.47* 1.64*  CALCIUM 9.0 8.5*   Liver Function Tests: Recent Labs  Lab 02/12/20 0917  AST 18  ALT 13  ALKPHOS 79  BILITOT 0.6  PROT 7.4  ALBUMIN 3.5   Recent Labs  Lab 02/12/20 0950  LIPASE 38   No results for input(s): AMMONIA in the last 168 hours. Coagulation Profile: No results for input(s): INR, PROTIME in the last 168 hours. CBC: Recent Labs  Lab 02/12/20 0917  WBC 8.1  NEUTROABS 6.6  HGB 11.3*  HCT 38.6*  MCV 89.4  PLT 367   Cardiac Enzymes: No results for input(s): CKTOTAL, CKMB, CKMBINDEX, TROPONINI in the last 168 hours. BNP: Invalid input(s): POCBNP CBG: No results for input(s): GLUCAP in the last 168 hours. HbA1C: Recent Labs    02/12/20 1112  HGBA1C 6.7*   Urine analysis:    Component Value Date/Time   COLORURINE YELLOW 02/12/2020 [place] 02/12/2020 1158    LABSPEC 1.010 02/12/2020 1158   PHURINE 6.0 02/12/2020 1158   GLUCOSEU NEGATIVE 02/12/2020 1158   HGBUR NEGATIVE 02/12/2020 1158   BILIRUBINUR NEGATIVE 02/12/2020 1158   KETONESUR NEGATIVE 02/12/2020 1158   PROTEINUR 100 (A) 02/12/2020 1158   UROBILINOGEN 0.2 07/08/2015 1314   NITRITE NEGATIVE 02/12/2020 1158   LEUKOCYTESUR NEGATIVE 02/12/2020 1158   Sepsis Labs: _0 (procalcitonin:4,lacticidven:4) ) Recent Results (from the past 240 hour(s))  SARS Coronavirus 2 by RT PCR (hospital order, performed in [facility] hospital lab) Nasopharyngeal Nasopharyngeal Swab     Status: None   Collection Time: 02/12/20 11:11 AM   Specimen: Nasopharyngeal Swab  Result Value Ref Range Status   SARS Coronavirus 2 NEGATIVE NEGATIVE Final    Comment: (NOTE) SARS-CoV-2 target nucleic acids are NOT DETECTED. The SARS-CoV-2 RNA is generally detectable in upper and lower respiratory specimens during the acute phase of infection. The lowest concentration of SARS-CoV-2 viral copies this  assay can detect is 250 copies / mL. A negative result does not preclude SARS-CoV-2 infection and should not be used as the sole basis for treatment or other patient management decisions.  A negative result may occur with improper specimen collection / handling, submission of specimen other than nasopharyngeal swab, presence of viral mutation(s) within the areas targeted by this assay, and inadequate number of viral copies (<250 copies / mL). A negative result must be combined with clinical observations, patient history, and epidemiological information. Fact Sheet for Patients:   StrictlyIdeas.no Fact Sheet for Healthcare Providers: BankingDealers.co.za This test is not yet approved or cleared  by the [place] FDA and has been authorized for detection and/or diagnosis of SARS-CoV-2 by FDA under an Emergency Use Authorization (EUA).  This EUA will remain in  effect (meaning this test can be used) for the duration of the COVID-19 declaration under Section 564(b)(1) of the Act, 21 U.S.C. section 360bbb-3(b)(1), unless the authorization is terminated or revoked sooner. Performed at [facility], [address], [place], [place] [address]      Scheduled Meds: . budesonide (PULMICORT) nebulizer solution  0.5 mg Nebulization BID  . furosemide  40 mg Intravenous Daily  . gabapentin  300 mg Oral BID  . guaiFENesin  600 mg Oral Daily  . heparin  5,000 Units Subcutaneous Q8H  . ipratropium-albuterol  3 mL Nebulization TID  . linaclotide  72 mcg Oral QAC breakfast  . metoprolol tartrate  12.5 mg Oral BID  . pantoprazole  40 mg Oral BID AC  . predniSONE  50 mg Oral Q breakfast  . sodium chloride flush  3 mL Intravenous Q12H   Continuous Infusions: . sodium chloride    . cefTRIAXone (ROCEPHIN)  IV Stopped (02/12/20 2100)  . metronidazole 500 mg (02/13/20 0319)    Procedures/Studies: DG Chest 2 View  Result Date: 02/12/2020 CLINICAL DATA:  pt reports shortness of breath this am and reports used inhaler and nebulizer with no relief. Hx stroke, HTN, diabetes, COPD, asthma, MI, former smoker EXAM: CHEST - 2 VIEW COMPARISON:  Chest radiograph 02/02/2020 FINDINGS: Stable cardiomediastinal contours. Normal heart size. Chronic bilateral coarsening of the interstitium. Scattered atelectasis. No new focal consolidation. No pneumothorax or pleural effusion. No acute finding in the visualized skeleton. IMPRESSION: No acute cardiopulmonary finding. Chronic bronchitic change, superimposed acute component difficult to exclude. Electronically Signed   By: Audie Pinto M.D.   On: 02/12/2020 10:16   DG Chest 2 View  Result Date: 02/02/2020 CLINICAL DATA:  Shortness of breath. EXAM: CHEST - 2 VIEW COMPARISON:  December 02, 2019 FINDINGS: Mild diffuse chronic appearing increased lung markings are seen which is unchanged in severity when compared to the prior study.  There is no evidence of acute infiltrate, pleural effusion or pneumothorax. The heart size and mediastinal contours are within normal limits. A radiopaque fusion plate and screws are seen overlying the lower cervical spine. IMPRESSION: No active cardiopulmonary disease. Electronically Signed   By: Virgina Norfolk M.D.   On: 02/02/2020 17:13   CT ABDOMEN PELVIS W CONTRAST  Result Date: 02/12/2020 CLINICAL DATA:  Abdominal pain EXAM: CT ABDOMEN AND PELVIS WITH CONTRAST TECHNIQUE: Multidetector CT imaging of the abdomen and pelvis was performed using the standard protocol following bolus administration of intravenous contrast. CONTRAST:  163m OMNIPAQUE IOHEXOL 300 MG/ML  SOLN COMPARISON:  Feb 02, 2020 FINDINGS: Lower chest: There is mild scarring in the bases. There are small pleural effusions bilaterally. No lung base edema or airspace opacity.  Hepatobiliary: There is hepatic steatosis, most notably in the region of the fissure for the ligamentum teres. Liver otherwise appears unremarkable. Gallbladder wall is not appreciably thickened. There is no biliary duct dilatation. Pancreas: No pancreatic mass or inflammatory focus. Spleen: No splenic lesions are evident. Adrenals/Urinary Tract: Adrenals bilaterally appear unremarkable. Kidneys bilaterally show no evident mass or hydronephrosis on either side. There is no evident renal or ureteral calculus on either side. Urinary bladder is midline with wall thickness within normal limits. Stomach/Bowel: Rectum is mildly distended with liquid stool in fluid. No rectal wall thickening. No perirectal stranding. There remains mild wall thickening in the mid to distal sigmoid colon without surrounding mesenteric thickening or fluid. No bowel wall thickening is noted elsewhere. No evident bowel obstruction. The terminal ileum appears normal. There is no demonstrable free air or portal venous air. Vascular/Lymphatic: There is no abdominal aortic aneurysm. There is aortic and  iliac artery atherosclerosis. Major venous structures appear patent. There is no evident adenopathy in the abdomen or pelvis. Reproductive: Prostate and seminal vesicles are normal in size and contour. No pelvic mass evident. Other: Appendix appears normal. No abscess or ascites evident in the abdomen or pelvis. Musculoskeletal: Multiple foci of degenerative change noted in the lumbar spine. Postoperative screw and plate fixation on the left at L3 and L4 with support hardware intact. No blastic or lytic bone lesions. No intramuscular or abdominal wall lesions are evident. IMPRESSION: 1. Mild wall thickening in the mid to distal sigmoid colon may represent persistent degree of colitis. Rectum mildly distended with liquid stool and air without wall thickening in this area. Bowel otherwise appears unremarkable. No evident bowel obstruction. 2.  Small pleural effusions bilaterally with bibasilar scarring. 3.  There is a degree of hepatic steatosis. 4.  No abscess in the abdomen or pelvis.  Appendix appears normal. 5. No evident renal or ureteral calculus. No hydronephrosis on either side. Urinary bladder wall thickness normal. 6.  Aortic Atherosclerosis (ICD10-I70.0). Electronically Signed   By: Lowella Grip III M.D.   On: 02/12/2020 12:39   CT ABDOMEN PELVIS W CONTRAST  Result Date: 02/02/2020 CLINICAL DATA:  Abdominal pain. Neutropenia. EXAM: CT ABDOMEN AND PELVIS WITH CONTRAST TECHNIQUE: Multidetector CT imaging of the abdomen and pelvis was performed using the standard protocol following bolus administration of intravenous contrast. CONTRAST:  113m OMNIPAQUE IOHEXOL 300 MG/ML  SOLN COMPARISON:  November 27, 2019 FINDINGS: Lower chest: The lung bases are clear. The heart size is normal. Hepatobiliary: The liver is normal. Normal gallbladder.There is no biliary ductal dilation. Pancreas: Normal contours without ductal dilatation. No peripancreatic fluid collection. Spleen: Unremarkable. Adrenals/Urinary Tract:  --Adrenal glands: Unremarkable. --Right kidney/ureter: No hydronephrosis or radiopaque kidney stones. --Left kidney/ureter: No hydronephrosis or radiopaque kidney stones. --Urinary bladder: Unremarkable. Stomach/Bowel: --Stomach/Duodenum: No hiatal hernia or other gastric abnormality. Normal duodenal course and caliber. --Small bowel: Unremarkable. --Colon: There is some questionable mild wall thickening of the sigmoid colon. --Appendix: Normal. Vascular/Lymphatic: Atherosclerotic calcification is present within the non-aneurysmal abdominal aorta, without hemodynamically significant stenosis. --No retroperitoneal lymphadenopathy. --No mesenteric lymphadenopathy. --No pelvic or inguinal lymphadenopathy. Reproductive: Unremarkable Other: No ascites or free air. The abdominal wall is normal. Musculoskeletal. No acute displaced fractures. IMPRESSION: 1. There is some questionable mild wall thickening of the sigmoid colon. Correlate for signs and symptoms of colitis. 2. Normal appendix in the right lower quadrant. Aortic Atherosclerosis (ICD10-I70.0). Electronically Signed   By: CConstance HolsterM.D.   On: 02/02/2020 20:18    DOrson Eva DO  [facility]  If 7PM-7AM, please contact night-coverage [email or web address] Password [email or web address] 02/13/2020, 8:51 AM   LOS: 1 day

## 2020-02-13 NOTE — Consult Note (Signed)
Referring Provider: Orson Eva, MD Primary Care Physician:  Rosita Fire, MD Primary Gastroenterologist:  Garfield Cornea, MD  Reason for Consultation:  colitis  HPI: Bryan [name] is a 64 y.o. male with history of chronic abdominal pain, severe esophagitis, gastric/duodenal ulcers, constipation, pulmonary hypertension, NSTEMI due to demand ischemia in the setting of uncontrolled hypertension in March 2021, oxygen dependent (since 11/2019) COPD, TIA, chronic kidney disease, diastolic CHF who presented to the emergency department with complaints of significant shortness of breath.  At baseline has shortness of breath and felt like symptoms were worse.  He also complained of midsternal chest pain.  He also complained of generalized abdominal tenderness.  Patient had CT abdomen pelvis with contrast May 23 while in the ED.  There was questionable mild wall thickening of the sigmoid colon.  He was treated with Cipro and Flagyl as an outpatient.  Repeat CT abdomen pelvis with contrast yesterday showed hepatic steatosis, mild wall thickening in the mid to distal sigmoid colon, rectum distended mildly with liquid stool.  Major venous structures appeared patent.  There was some aortic and iliac artery atherosclerosis.  In the ED yesterday his white blood cell count was 8100, hemoglobin 11.3 (stable from baseline), platelets normal, BNP 474, creatinine 1.47 slightly up from baseline, BUN 11, LFTs normal.  Lipase normal.  Covid negative, troponin normal.  A1c 6.7.  He was started on Rocephin and Flagyl.  Patient complains of abdominal pain for 6-8 months. No worse recently. Pain in central abdominal. No worse with meals. Sometimes worse after BM. With pain he feels more sob. Will have to "catch my breath" when the pain is intense. Denies diarrhea. Baseline constipation, takes Linzess about every 2-3 days. Has 2-3 soft/loose stools after taking Linzess. No melena, brbpr. No N/V. Does have heartburn on pantoprazole  twice a day.   Seen recently in our office for hospital follow-up.  CT March 2021 revealed distal esophageal wall thickening, gastric distention, wall thickening of the proximal duodenum.  EGD by Dr. Paulita Fujita showed LA grade D esophagitis, 1 nonbleeding cratered gastric ulcer in the prepyloric region, many nonbleeding cratered duodenal ulcers without stigmata of bleeding, although mild luminal narrowing of the duodenal bulb the scope was able to be passed.  Felt to be due to NSAID use.  H. pylori serologies were negative.  He was doing well and we scheduled him for surveillance EGD.  Been maintained on Protonix twice daily, Linzess 72 mcg daily.   Last colonoscopy in September 2018 with sigmoid diverticulosis, 4 mm hyperplastic polyp removed from the rectum, ascending colon AVMs, surveillance study planned for 2023.  Prior to Admission medications   Medication Sig Start Date End Date Taking? Authorizing Provider  acetaminophen (TYLENOL) 500 MG tablet Take 500 mg by mouth daily.   Yes [provider]  albuterol (PROVENTIL) (2.5 MG/3ML) 0.083% nebulizer solution Take 3 mLs (2.5 mg total) by nebulization every 4 (four) hours as needed for wheezing or shortness of breath. 06/14/19  Yes Emokpae, Courage, MD  albuterol (VENTOLIN HFA) 108 (90 Base) MCG/ACT inhaler Inhale 2 puffs into the lungs every 4 (four) hours as needed for wheezing or shortness of breath. Do not use with nebulizer 06/14/19  Yes Emokpae, Courage, MD  calcium carbonate (TUMS - DOSED IN MG ELEMENTAL CALCIUM) 500 MG chewable tablet Chew 2 tablets (400 mg of elemental calcium total) by mouth 3 (three) times daily as needed for indigestion or heartburn. 12/04/19  Yes Amin, Jeanella Flattery, MD  Fluticasone-Umeclidin-Vilant (TRELEGY ELLIPTA) 100-62.5-25  MCG/INH AEPB Inhale 1 puff into the lungs daily. 06/14/19  Yes Emokpae, Courage, MD  gabapentin (NEURONTIN) 300 MG capsule Take 1 capsule (300 mg total) by mouth 2 (two) times daily. Pt. Says he  is taking twice daily. Patient taking differently: Take 300 mg by mouth 2 (two) times daily.  06/14/19  Yes Emokpae, Courage, MD  guaiFENesin (MUCINEX) 600 MG 12 hr tablet Take 1 tablet (600 mg total) by mouth 2 (two) times daily. Patient taking differently: Take 600 mg by mouth daily.  06/14/19 06/13/20 Yes Roxan Hockey, MD  linaclotide (LINZESS) 72 MCG capsule Take 1 capsule (72 mcg total) by mouth daily before breakfast. Patient taking differently: Take 72 mcg by mouth daily as needed.  06/14/19  Yes Emokpae, Courage, MD  metoprolol tartrate (LOPRESSOR) 25 MG tablet Take 0.5 tablets (12.5 mg total) by mouth 2 (two) times daily. 12/04/19 02/12/20 Yes Amin, Ankit Chirag, MD  OXYGEN Inhale 2.5 L into the lungs daily.   Yes [provider]  pantoprazole (PROTONIX) 40 MG tablet Take 1 tablet (40 mg total) by mouth 2 (two) times daily before a meal. 12/04/19 02/12/20 Yes Amin, Ankit Chirag, MD  polyethylene glycol powder (MIRALAX) 17 GM/SCOOP powder Take 17 g by mouth daily as needed (FOR CONSTIPATION).  11/14/18  Yes [provider]    Current Facility-Administered Medications  Medication Dose Route Frequency Provider Last Rate Last Admin  . 0.9 %  sodium chloride infusion  250 mL Intravenous PRN Tat, David, MD      . acetaminophen (TYLENOL) tablet 650 mg  650 mg Oral Q4H PRN Orson Eva, MD   650 mg at 02/12/20 2238  . budesonide (PULMICORT) nebulizer solution 0.5 mg  0.5 mg Nebulization BID Tat, David, MD   0.5 mg at 02/13/20 0738  . calcium carbonate (TUMS - dosed in mg elemental calcium) chewable tablet 400 mg of elemental calcium  2 tablet Oral TID PRN Tat, Shanon Brow, MD      . cefTRIAXone (ROCEPHIN) 2 g in sodium chloride 0.9 % 100 mL IVPB  2 g Intravenous Q24H Orson Eva, MD   Stopped at 02/12/20 2100  . furosemide (LASIX) injection 40 mg  40 mg Intravenous Daily Tat, David, MD      . gabapentin (NEURONTIN) capsule 300 mg  300 mg Oral BID Orson Eva, MD   300 mg at 02/12/20 2238  .  guaiFENesin (MUCINEX) 12 hr tablet 600 mg  600 mg Oral Daily Tat, David, MD   600 mg at 02/12/20 2031  . heparin injection 5,000 Units  5,000 Units Subcutaneous Franco Collet, MD   5,000 Units at 02/13/20 [phone]  . ipratropium-albuterol (DUONEB) 0.5-2.5 (3) MG/3ML nebulizer solution 3 mL  3 mL Nebulization TID Orson Eva, MD   3 mL at 02/13/20 0741  . linaclotide (LINZESS) capsule 72 mcg  72 mcg Oral QAC breakfast Tat, David, MD      . metoprolol tartrate (LOPRESSOR) tablet 12.5 mg  12.5 mg Oral BID Orson Eva, MD   12.5 mg at 02/12/20 2238  . metroNIDAZOLE (FLAGYL) IVPB 500 mg  500 mg Intravenous Franco Collet, MD 100 mL/hr at 02/13/20 0319 500 mg at 02/13/20 0319  . ondansetron (ZOFRAN) injection 4 mg  4 mg Intravenous Q6H PRN Tat, David, MD      . pantoprazole (PROTONIX) EC tablet 40 mg  40 mg Oral BID AC Tat, David, MD      . polyethylene glycol (MIRALAX / GLYCOLAX) packet 17 g  17  g Oral Daily PRN Tat, Shanon Brow, MD      . predniSONE (DELTASONE) tablet 50 mg  50 mg Oral Q breakfast Tat, David, MD      . sodium chloride flush (NS) 0.9 % injection 3 mL  3 mL Intravenous Therisa Doyne, MD   3 mL at 02/12/20 2241  . sodium chloride flush (NS) 0.9 % injection 3 mL  3 mL Intravenous PRN Orson Eva, MD        Allergies as of 02/12/2020  . (No Known Allergies)    Past Medical History:  Diagnosis Date  . Acid reflux   . Arthritis   . Asthma   . Chronic pain    with leg and back pain (disc problem)  . COPD (chronic obstructive pulmonary disease) ([place])   . Diabetes mellitus without complication ([place])   . Headache    HX OF  . History of upper GI x-ray series    to follow showed large duodenal ulcer H pylori serologies were negative  . Hypertension   . NSTEMI (non-ST elevated myocardial infarction) ([place]) 11/2019  . Pneumonia    07/17/17  . Pre-diabetes   . Stroke [facility])    TIA MINI STROKE  . Tubular adenoma     Past Surgical History:  Procedure Laterality Date  . BACK SURGERY  7/05;  5/09    Dr.Hirsch,3 lumbar  X3  . BACK SURGERY    . COLONOSCOPY  Feb 2012   Dr. Gala Romney: normal rectum, pedunculate polyp removed but not recovered  . COLONOSCOPY WITH ESOPHAGOGASTRODUODENOSCOPY (EGD) N/A 11/18/2013   Dr.Rourk- tcs= normal rectum, multipe polyps about the ileocecal valve and distal transverse colon o/w the remainder of the colonic mucosa appeared normal bx= tubular adenoma. EGD= normal esophagus, stomach with scattered erosions mottling, friablility, no ulcer or infiltrating process patent pylorus bx= chronic inflammation. next TCS 11/2016  . COLONOSCOPY WITH PROPOFOL N/A 05/25/2017   Dr. Gala Romney: sigmoid diverticulosis, one 4 mm hyerplastic rectal polyp, ascending colonic AVMs surveillance 2023  . ESOPHAGOGASTRODUODENOSCOPY  1/06   Dr. Volney American esophageal erosions,U-shaped stomach,marked erosions and edema of the bulb without discrete ulcer disease.   . ESOPHAGOGASTRODUODENOSCOPY (EGD) WITH PROPOFOL N/A 05/25/2017   Dr. Gala Romney: reflux esophagitis s/p empiric dilation, normal stomach and duodenum  . ESOPHAGOGASTRODUODENOSCOPY (EGD) WITH PROPOFOL Left 11/30/2019   LA Grade D esophagitis, small hiatal hernia, one non-bleeding cratered gastric ulcer in prepyloric region of stomach, many non-bleeding cratered duodenal ulcers without stigmata of bleeding, mild luminal narrowing at apex of duodenal bulb but traversed easily. H.pylori serology negative.  Marland Kitchen MALONEY DILATION N/A 05/25/2017   Procedure: Venia Minks DILATION;  Surgeon: Daneil Dolin, MD;  Location: [facility];  Service: Endoscopy;  Laterality: N/A;  . NECK SURGERY  10/2007   PLATE IN NECK  . POLYPECTOMY  05/25/2017   Procedure: POLYPECTOMY;  Surgeon: Daneil Dolin, MD;  Location: [facility];  Service: Endoscopy;;  rectal  . RIGHT/LEFT HEART CATH AND CORONARY ANGIOGRAPHY N/A 12/03/2019   Procedure: RIGHT/LEFT HEART CATH AND CORONARY ANGIOGRAPHY;  Surgeon: Nigel Mormon, MD;  Location: [place] [facility];  Service:  Cardiovascular;  Laterality: N/A;  . TRANSURETHRAL RESECTION OF PROSTATE N/A 08/17/2017   Procedure: TRANSURETHRAL RESECTION OF THE PROSTATE (TURP);  Surgeon: Irine Seal, MD;  Location: [facility];  Service: Urology;  Laterality: N/A;    Family History  Problem Relation Age of Onset  . Cancer Father   . Asthma Mother   . Colon cancer Neg Hx  Social History   Socioeconomic History  . Marital status: Single    Spouse name: Not on file  . Number of children: 2  . Years of education: 77  . Highest education level: High school graduate  Occupational History  . Occupation: disabled    Fish farm manager: UNEMPLOYED  Tobacco Use  . Smoking status: Former Smoker    Packs/day: 0.50    Years: 40.00    Pack years: 20.00    Types: Cigarettes    Quit date: 11/11/2014    Years since quitting: 5.2  . Smokeless tobacco: Never Used  Substance and Sexual Activity  . Alcohol use: No    Alcohol/week: 0.0 standard drinks  . Drug use: Not Currently    Types: Marijuana    Comment: occas; denied 01/28/20  . Sexual activity: Not Currently    Birth control/protection: None  Other Topics Concern  . Not on file  Social History Narrative   Lives with girlfriend and son, is on disability for history of back injuries.   Social Determinants of Health   Financial Resource Strain: Medium Risk  . Difficulty of Paying Living Expenses: Somewhat hard  Food Insecurity: No Food Insecurity  . Worried About Charity fundraiser in the Last Year: Never true  . Ran Out of Food in the Last Year: Never true  Transportation Needs: No Transportation Needs  . Lack of Transportation (Medical): No  . Lack of Transportation (Non-Medical): No  Physical Activity: Inactive  . Days of Exercise per Week: 0 days  . Minutes of Exercise per Session: 0 min  Stress: No Stress Concern Present  . Feeling of Stress : Not at all  Social Connections: Moderately Isolated  . Frequency of Communication with Friends and Family: More than  three times a week  . Frequency of Social Gatherings with Friends and Family: More than three times a week  . Attends Religious Services: Never  . Active Member of Clubs or Organizations: No  . Attends Archivist Meetings: Never  . Marital Status: Never married  Intimate Partner Violence: Not At Risk  . Fear of Current or Ex-Partner: No  . Emotionally Abused: No  . Physically Abused: No  . Sexually Abused: No     ROS:  General: Negative for anorexia, weight loss, fever, chills, fatigue, weakness. Eyes: Negative for vision changes.  ENT: Negative for hoarseness, difficulty swallowing , nasal congestion. CV: Negative for chest pain, angina, palpitations, +dyspnea on exertion, peripheral edema.  Respiratory: Negative for dyspnea at rest, +dyspnea on exertion, cough, sputum, wheezing.  GI: See history of present illness. GU:  Negative for dysuria, hematuria, urinary incontinence, urinary frequency, nocturnal urination.  MS: Negative for joint pain, low back pain.  Derm: Negative for rash or itching.  Neuro: Negative for weakness, abnormal sensation, seizure, frequent headaches, memory loss, confusion. +burning lower extremities, chronic Psych: Negative for anxiety, depression, suicidal ideation, hallucinations.  Endo: Negative for unusual weight change.  Heme: Negative for bruising or bleeding. Allergy: Negative for rash or hives.       Physical Examination: Vital signs in last 24 hours: Temp:  [98.2 F (36.8 C)-98.7 F (37.1 C)] 98.2 F (36.8 C) (06/03 0457) Pulse Rate:  [74-112] 83 (06/03 0457) Resp:  [19-35] 20 (06/03 0457) BP: (116-185)/(66-99) 136/90 (06/03 0457) SpO2:  [82 %-100 %] 92 % (06/03 0738) FiO2 (%):  [32 %] 32 % (06/02 1913) Weight:  [55.1 kg] 55.1 kg (06/03 0500) Last BM Date: 02/12/20  General: Well-nourished, well-developed in  no acute distress. Able to complete sentences without significant sob but will occasionally take pause for deep  breath. Head: Normocephalic, atraumatic.   Eyes: Conjunctiva pink, no icterus. Mouth: Oropharyngeal mucosa moist and pink , no lesions erythema or exudate. Neck: Supple without thyromegaly, masses, or lymphadenopathy.  Lungs: Clear to auscultation bilaterally.  Heart: Regular rate and rhythm, no murmurs rubs or gallops.  Abdomen: Bowel sounds are normal,  nondistended, no hepatosplenomegaly or masses, no abdominal bruits or    hernia , no rebound or guarding.  Mild tenderness diffusely with some focus in ruq and llq Rectal: not performed Extremities: No lower extremity edema, clubbing, deformity.  Neuro: Alert and oriented x 4 , grossly normal neurologically.  Skin: Warm and dry, no rash or jaundice.   Psych: Alert and cooperative, normal mood and affect.        Intake/Output from previous day: 06/02 0701 - 06/03 0700 In: 291.7 [IV Piggyback:291.7] Out: 9735 [Urine:1675] Intake/Output this shift: No intake/output data recorded.  Lab Results: CBC Recent Labs    02/12/20 0917  WBC 8.1  HGB 11.3*  HCT 38.6*  MCV 89.4  PLT 367   BMET Recent Labs    02/12/20 0917 02/13/20 0549  NA 140 139  K 4.2 3.6  CL 101 97*  CO2 29 31  GLUCOSE 97 98  BUN 11 19  CREATININE 1.47* 1.64*  CALCIUM 9.0 8.5*   LFT Recent Labs    02/12/20 0917  BILITOT 0.6  ALKPHOS 79  AST 18  ALT 13  PROT 7.4  ALBUMIN 3.5    Lipase Recent Labs    02/12/20 0950  LIPASE 38    PT/INR No results for input(s): LABPROT, INR in the last 72 hours.    Imaging Studies: DG Chest 2 View  Result Date: 02/12/2020 CLINICAL DATA:  pt reports shortness of breath this am and reports used inhaler and nebulizer with no relief. Hx stroke, HTN, diabetes, COPD, asthma, MI, former smoker EXAM: CHEST - 2 VIEW COMPARISON:  Chest radiograph 02/02/2020 FINDINGS: Stable cardiomediastinal contours. Normal heart size. Chronic bilateral coarsening of the interstitium. Scattered atelectasis. No new focal consolidation.  No pneumothorax or pleural effusion. No acute finding in the visualized skeleton. IMPRESSION: No acute cardiopulmonary finding. Chronic bronchitic change, superimposed acute component difficult to exclude. Electronically Signed   By: Audie Pinto M.D.   On: 02/12/2020 10:16   DG Chest 2 View  Result Date: 02/02/2020 CLINICAL DATA:  Shortness of breath. EXAM: CHEST - 2 VIEW COMPARISON:  December 02, 2019 FINDINGS: Mild diffuse chronic appearing increased lung markings are seen which is unchanged in severity when compared to the prior study. There is no evidence of acute infiltrate, pleural effusion or pneumothorax. The heart size and mediastinal contours are within normal limits. A radiopaque fusion plate and screws are seen overlying the lower cervical spine. IMPRESSION: No active cardiopulmonary disease. Electronically Signed   By: Virgina Norfolk M.D.   On: 02/02/2020 17:13   CT ABDOMEN PELVIS W CONTRAST  Result Date: 02/12/2020 CLINICAL DATA:  Abdominal pain EXAM: CT ABDOMEN AND PELVIS WITH CONTRAST TECHNIQUE: Multidetector CT imaging of the abdomen and pelvis was performed using the standard protocol following bolus administration of intravenous contrast. CONTRAST:  122m OMNIPAQUE IOHEXOL 300 MG/ML  SOLN COMPARISON:  Feb 02, 2020 FINDINGS: Lower chest: There is mild scarring in the bases. There are small pleural effusions bilaterally. No lung base edema or airspace opacity. Hepatobiliary: There is hepatic steatosis, most notably in the region  of the fissure for the ligamentum teres. Liver otherwise appears unremarkable. Gallbladder wall is not appreciably thickened. There is no biliary duct dilatation. Pancreas: No pancreatic mass or inflammatory focus. Spleen: No splenic lesions are evident. Adrenals/Urinary Tract: Adrenals bilaterally appear unremarkable. Kidneys bilaterally show no evident mass or hydronephrosis on either side. There is no evident renal or ureteral calculus on either side. Urinary  bladder is midline with wall thickness within normal limits. Stomach/Bowel: Rectum is mildly distended with liquid stool in fluid. No rectal wall thickening. No perirectal stranding. There remains mild wall thickening in the mid to distal sigmoid colon without surrounding mesenteric thickening or fluid. No bowel wall thickening is noted elsewhere. No evident bowel obstruction. The terminal ileum appears normal. There is no demonstrable free air or portal venous air. Vascular/Lymphatic: There is no abdominal aortic aneurysm. There is aortic and iliac artery atherosclerosis. Major venous structures appear patent. There is no evident adenopathy in the abdomen or pelvis. Reproductive: Prostate and seminal vesicles are normal in size and contour. No pelvic mass evident. Other: Appendix appears normal. No abscess or ascites evident in the abdomen or pelvis. Musculoskeletal: Multiple foci of degenerative change noted in the lumbar spine. Postoperative screw and plate fixation on the left at L3 and L4 with support hardware intact. No blastic or lytic bone lesions. No intramuscular or abdominal wall lesions are evident. IMPRESSION: 1. Mild wall thickening in the mid to distal sigmoid colon may represent persistent degree of colitis. Rectum mildly distended with liquid stool and air without wall thickening in this area. Bowel otherwise appears unremarkable. No evident bowel obstruction. 2.  Small pleural effusions bilaterally with bibasilar scarring. 3.  There is a degree of hepatic steatosis. 4.  No abscess in the abdomen or pelvis.  Appendix appears normal. 5. No evident renal or ureteral calculus. No hydronephrosis on either side. Urinary bladder wall thickness normal. 6.  Aortic Atherosclerosis (ICD10-I70.0). Electronically Signed   By: Lowella Grip III M.D.   On: 02/12/2020 12:39   CT ABDOMEN PELVIS W CONTRAST  Result Date: 02/02/2020 CLINICAL DATA:  Abdominal pain. Neutropenia. EXAM: CT ABDOMEN AND PELVIS WITH  CONTRAST TECHNIQUE: Multidetector CT imaging of the abdomen and pelvis was performed using the standard protocol following bolus administration of intravenous contrast. CONTRAST:  154m OMNIPAQUE IOHEXOL 300 MG/ML  SOLN COMPARISON:  November 27, 2019 FINDINGS: Lower chest: The lung bases are clear. The heart size is normal. Hepatobiliary: The liver is normal. Normal gallbladder.There is no biliary ductal dilation. Pancreas: Normal contours without ductal dilatation. No peripancreatic fluid collection. Spleen: Unremarkable. Adrenals/Urinary Tract: --Adrenal glands: Unremarkable. --Right kidney/ureter: No hydronephrosis or radiopaque kidney stones. --Left kidney/ureter: No hydronephrosis or radiopaque kidney stones. --Urinary bladder: Unremarkable. Stomach/Bowel: --Stomach/Duodenum: No hiatal hernia or other gastric abnormality. Normal duodenal course and caliber. --Small bowel: Unremarkable. --Colon: There is some questionable mild wall thickening of the sigmoid colon. --Appendix: Normal. Vascular/Lymphatic: Atherosclerotic calcification is present within the non-aneurysmal abdominal aorta, without hemodynamically significant stenosis. --No retroperitoneal lymphadenopathy. --No mesenteric lymphadenopathy. --No pelvic or inguinal lymphadenopathy. Reproductive: Unremarkable Other: No ascites or free air. The abdominal wall is normal. Musculoskeletal. No acute displaced fractures. IMPRESSION: 1. There is some questionable mild wall thickening of the sigmoid colon. Correlate for signs and symptoms of colitis. 2. Normal appendix in the right lower quadrant. Aortic Atherosclerosis (ICD10-I70.0). Electronically Signed   By: CConstance HolsterM.D.   On: 02/02/2020 20:18  [4 week]   Impression: 64year old gentleman with history of chronic abdominal pain, severe esophagitis, gastric  and duodenal ulcers, baseline constipation, NSTEMI due to demand ischemia in the setting of uncontrolled hypertension in March 2021, O2  dependent COPD, chronic kidney disease, diastolic CHF who presented to the emergency department with worsening shortness of breath.  We were asked to see the patient regarding chronic abdominal pain and abnormal CT findings.  CT abdomen pelvis with contrast May 23 showed questionable mild wall thickening of the sigmoid colon.  Patient was placed on Cipro and Flagyl as an outpatient but noted no improvement.  Repeat CT abdomen pelvis with contrast yesterday showed mild wall thickening in the mid to distal sigmoid colon, rectum distended mildly with liquid stool, major venous structures appear patent, some aortic and iliac artery atherosclerosis.  He is currently on Rocephin and Flagyl.  Patient's last colonoscopy in 2018 with sigmoid diverticulosis, hyperplastic rectal polyp removed, ascending colon AVMs.  He denies any diarrhea.  Differential diagnosis includes diverticulitis, colitis, less likely malignancy.  Patient's presentation not consistent with infectious colitis/ischemic colitis.  Reflux esophagitis/gastric and duodenal ulcers diagnosed in March 2021.  Continue pantoprazole twice daily.  Continue to avoid NSAIDs. In process of scheduling surveillance EGD.   Plan: 1. Complete course of antibiotic therapy. 2. Consider updating colonoscopy once recovered from acute illness to evaluate abnormal sigmoid colon on CT. 3. If patient develops diarrhea, consider stool studies. 4. We will continue to follow with you.  We would like to thank you for the opportunity to participate in the care of [name].  Laureen Ochs. Bernarda Caffey [facility] [phone] 6/3/202111:26 AM     LOS: 1 day

## 2020-02-14 DIAGNOSIS — K5732 Diverticulitis of large intestine without perforation or abscess without bleeding: Secondary | ICD-10-CM

## 2020-02-14 DIAGNOSIS — J9621 Acute and chronic respiratory failure with hypoxia: Secondary | ICD-10-CM

## 2020-02-14 LAB — GASTROINTESTINAL PANEL BY PCR, STOOL (REPLACES STOOL CULTURE)

## 2020-02-14 LAB — CBC
HCT: 35.9 % — ABNORMAL LOW (ref 39.0–52.0)
Hemoglobin: 10.8 g/dL — ABNORMAL LOW (ref 13.0–17.0)
MCH: 26 pg (ref 26.0–34.0)
MCHC: 30.1 g/dL (ref 30.0–36.0)
MCV: 86.3 fL (ref 80.0–100.0)
Platelets: 426 10*3/uL — ABNORMAL HIGH (ref 150–400)
RBC: 4.16 MIL/uL — ABNORMAL LOW (ref 4.22–5.81)
RDW: 15.6 % — ABNORMAL HIGH (ref 11.5–15.5)
WBC: 14.2 10*3/uL — ABNORMAL HIGH (ref 4.0–10.5)
nRBC: 0 % (ref 0.0–0.2)

## 2020-02-14 LAB — BASIC METABOLIC PANEL
Anion gap: 9 (ref 5–15)
BUN: 19 mg/dL (ref 8–23)
CO2: 32 mmol/L (ref 22–32)
Calcium: 8.6 mg/dL — ABNORMAL LOW (ref 8.9–10.3)
Chloride: 99 mmol/L (ref 98–111)
Creatinine, Ser: 1.49 mg/dL — ABNORMAL HIGH (ref 0.61–1.24)
GFR calc Af Amer: 57 mL/min — ABNORMAL LOW (ref >60)
GFR calc non Af Amer: 49 mL/min — ABNORMAL LOW (ref >60)
Glucose, Bld: 105 mg/dL — ABNORMAL HIGH (ref 70–99)
Potassium: 3.8 mmol/L (ref 3.5–5.1)
Sodium: 140 mmol/L (ref 135–145)

## 2020-02-14 LAB — MAGNESIUM: Magnesium: 1.6 mg/dL — ABNORMAL LOW (ref 1.7–2.4)

## 2020-02-14 MED ORDER — METRONIDAZOLE 500 MG PO TABS: 500.0000 mg | ORAL_TABLET | Freq: Three times a day (TID) | ORAL | 0 refills | Status: DC

## 2020-02-14 MED ORDER — CIPROFLOXACIN HCL 250 MG PO TABS
500.0000 mg | ORAL_TABLET | Freq: Two times a day (BID) | ORAL | Status: DC
  Administered 2020-02-14: 500 mg via ORAL
  Filled 2020-02-14: qty 2

## 2020-02-14 MED ORDER — METRONIDAZOLE 500 MG PO TABS: 500.0000 mg | ORAL_TABLET | Freq: Three times a day (TID) | ORAL | Status: DC

## 2020-02-14 MED ORDER — FUROSEMIDE 40 MG PO TABS: 40.0000 mg | ORAL_TABLET | Freq: Every day | ORAL | 1 refills | Status: DC

## 2020-02-14 MED ORDER — MAGNESIUM SULFATE 2 GM/50ML IV SOLN
2.0000 g | Freq: Once | INTRAVENOUS | Status: AC
  Administered 2020-02-14: 2 g via INTRAVENOUS
  Filled 2020-02-14: qty 50

## 2020-02-14 MED ORDER — CIPROFLOXACIN HCL 500 MG PO TABS: 500.0000 mg | ORAL_TABLET | Freq: Two times a day (BID) | ORAL | 0 refills | Status: DC

## 2020-02-14 MED ORDER — PREDNISONE 50 MG PO TABS: 50.0000 mg | ORAL_TABLET | Freq: Every day | ORAL | 0 refills | Status: DC

## 2020-02-14 NOTE — Care Management Important Message (Signed)
Important Message  Patient Details  Name: Bryan [name] MRN: [id number] Date of Birth: 10/21/1955   Medicare Important Message Given:  Yes     [name] 02/14/2020, 12:51 PM

## 2020-02-14 NOTE — Progress Notes (Signed)
Subjective: Feeling better today. Overall improved from admission. Denies significant abdominal pain but some residual soreness LLQ. Denies N/V. Had a bowel movement today that felt soft but not loose. Did not look to see if there was blood. No other overt GI complaints.  Objective: Vital signs in last 24 hours: Temp:  [98.5 F (36.9 C)-98.7 F (37.1 C)] 98.5 F (36.9 C) (06/04 0432) Pulse Rate:  [79-89] 79 (06/04 0432) Resp:  [16-19] 16 (06/04 0432) BP: (119-144)/(71-76) 144/76 (06/04 0432) SpO2:  [94 %-97 %] 94 % (06/04 0733) Last BM Date: 02/12/20 General:   Alert and oriented, pleasant Head:  Normocephalic and atraumatic. Eyes:  No icterus, sclera clear. Conjuctiva pink.  Heart:  S1, S2 present, no murmurs noted.  Lungs: Clear to auscultation bilaterally, without wheezing, rales, or rhonchi.  Abdomen:  Bowel sounds present, soft, non-tender, non-distended. No HSM or hernias noted. No rebound or guarding. Msk:  Symmetrical without gross deformities. Pulses:  Normal bilateral DP pulses noted. Extremities:  Without clubbing or edema. Neurologic:  Alert and  oriented x4;  grossly normal neurologically. Skin:  Warm and dry, intact without significant lesions.  Psych:  Alert and cooperative. Normal mood and affect.  Intake/Output from previous day: 06/03 0701 - 06/04 0700 In: 880 [P.O.:780; IV Piggyback:100] Out: 2535 [Urine:2535] Intake/Output this shift: No intake/output data recorded.  Lab Results: Recent Labs    02/12/20 0917 02/14/20 0516  WBC 8.1 14.2*  HGB 11.3* 10.8*  HCT 38.6* 35.9*  PLT 367 426*   BMET Recent Labs    02/12/20 0917 02/13/20 0549 02/14/20 0516  NA 140 139 140  K 4.2 3.6 3.8  CL 101 97* 99  CO2 29 31 32  GLUCOSE 97 98 105*  BUN _0 CREATININE 1.47* 1.64* 1.49*  CALCIUM 9.0 8.5* 8.6*   LFT Recent Labs    02/12/20 0917  PROT 7.4  ALBUMIN 3.5  AST 18  ALT 13  ALKPHOS 79  BILITOT 0.6   PT/INR No results for input(s):  LABPROT, INR in the last 72 hours. Hepatitis Panel No results for input(s): HEPBSAG, HCVAB, HEPAIGM, HEPBIGM in the last 72 hours.   Studies/Results: DG Chest 2 View  Result Date: 02/12/2020 CLINICAL DATA:  pt reports shortness of breath this am and reports used inhaler and nebulizer with no relief. Hx stroke, HTN, diabetes, COPD, asthma, MI, former smoker EXAM: CHEST - 2 VIEW COMPARISON:  Chest radiograph 02/02/2020 FINDINGS: Stable cardiomediastinal contours. Normal heart size. Chronic bilateral coarsening of the interstitium. Scattered atelectasis. No new focal consolidation. No pneumothorax or pleural effusion. No acute finding in the visualized skeleton. IMPRESSION: No acute cardiopulmonary finding. Chronic bronchitic change, superimposed acute component difficult to exclude. Electronically Signed   By: Audie Pinto M.D.   On: 02/12/2020 10:16   CT ABDOMEN PELVIS W CONTRAST  Result Date: 02/12/2020 CLINICAL DATA:  Abdominal pain EXAM: CT ABDOMEN AND PELVIS WITH CONTRAST TECHNIQUE: Multidetector CT imaging of the abdomen and pelvis was performed using the standard protocol following bolus administration of intravenous contrast. CONTRAST:  148m OMNIPAQUE IOHEXOL 300 MG/ML  SOLN COMPARISON:  Feb 02, 2020 FINDINGS: Lower chest: There is mild scarring in the bases. There are small pleural effusions bilaterally. No lung base edema or airspace opacity. Hepatobiliary: There is hepatic steatosis, most notably in the region of the fissure for the ligamentum teres. Liver otherwise appears unremarkable. Gallbladder wall is not appreciably thickened. There is no biliary duct dilatation. Pancreas: No pancreatic mass or inflammatory  focus. Spleen: No splenic lesions are evident. Adrenals/Urinary Tract: Adrenals bilaterally appear unremarkable. Kidneys bilaterally show no evident mass or hydronephrosis on either side. There is no evident renal or ureteral calculus on either side. Urinary bladder is midline with  wall thickness within normal limits. Stomach/Bowel: Rectum is mildly distended with liquid stool in fluid. No rectal wall thickening. No perirectal stranding. There remains mild wall thickening in the mid to distal sigmoid colon without surrounding mesenteric thickening or fluid. No bowel wall thickening is noted elsewhere. No evident bowel obstruction. The terminal ileum appears normal. There is no demonstrable free air or portal venous air. Vascular/Lymphatic: There is no abdominal aortic aneurysm. There is aortic and iliac artery atherosclerosis. Major venous structures appear patent. There is no evident adenopathy in the abdomen or pelvis. Reproductive: Prostate and seminal vesicles are normal in size and contour. No pelvic mass evident. Other: Appendix appears normal. No abscess or ascites evident in the abdomen or pelvis. Musculoskeletal: Multiple foci of degenerative change noted in the lumbar spine. Postoperative screw and plate fixation on the left at L3 and L4 with support hardware intact. No blastic or lytic bone lesions. No intramuscular or abdominal wall lesions are evident. IMPRESSION: 1. Mild wall thickening in the mid to distal sigmoid colon may represent persistent degree of colitis. Rectum mildly distended with liquid stool and air without wall thickening in this area. Bowel otherwise appears unremarkable. No evident bowel obstruction. 2.  Small pleural effusions bilaterally with bibasilar scarring. 3.  There is a degree of hepatic steatosis. 4.  No abscess in the abdomen or pelvis.  Appendix appears normal. 5. No evident renal or ureteral calculus. No hydronephrosis on either side. Urinary bladder wall thickness normal. 6.  Aortic Atherosclerosis (ICD10-I70.0). Electronically Signed   By: Lowella Grip III M.D.   On: 02/12/2020 12:39    Assessment: 64 year old gentleman with history of chronic abdominal pain, severe esophagitis, gastric and duodenal ulcers, baseline constipation, NSTEMI  due to demand ischemia in the setting of uncontrolled hypertension in March 2021, O2 dependent COPD, chronic kidney disease, diastolic CHF who presented to the emergency department with worsening shortness of breath.  We were asked to see the patient regarding chronic abdominal pain and abnormal CT findings.  CT abdomen pelvis with contrast May 23 showed questionable mild wall thickening of the sigmoid colon.  Patient was placed on Cipro and Flagyl as an outpatient but noted no improvement.  Repeat CT abdomen pelvis with contrast yesterday showed mild wall thickening in the mid to distal sigmoid colon, rectum distended mildly with liquid stool, major venous structures appear patent, some aortic and iliac artery atherosclerosis.  He is currently on Rocephin and Flagyl.  Patient's last colonoscopy in 2018 with sigmoid diverticulosis, hyperplastic rectal polyp removed, ascending colon AVMs.  Differential diagnosis includes diverticulitis, colitis, less likely malignancy.  Patient's presentation not consistent with infectious colitis/ischemic colitis. GI Path panel collected and in process. Soft but not loose stool this morning. Denies any diarrhea during hospitalization. Overall feels better.  Reflux esophagitis/gastric and duodenal ulcers diagnosed in March 2021.  Continue pantoprazole twice daily.  Continue to avoid NSAIDs. In process of scheduling surveillance EGD.     Plan: 1. Continue antibiotics 2. Planned outpatient colonoscopy if no acute changes (prior to surveillance EGD) 3. Eventual surveillance EGD 4. Supportive measures 5. Anticipate d/c in the next 24-48 hours 6. Will sign off for now.   Thank you for allowing [place] to participate in the care of [name]  Walden Field, DNP, AGNP-C Adult & Gerontological Nurse Practitioner [facility]    LOS: 2 days    02/14/2020, 9:46 AM

## 2020-02-14 NOTE — Discharge Summary (Signed)
Physician Discharge Summary  Bryan [name] ZSW:[id number] DOB: [birth year] DOA: 02/12/2020  PCP: Rosita Fire, MD  Admit date: 02/12/2020 Discharge date: 02/14/2020  Admitted From: Home Disposition:  Home   Recommendations for Outpatient Follow-up:  1. Follow up with PCP in 1-2 weeks 2. Please obtain BMP/CBC in one week    Equipment/Devices: 2.5L oxygen Discharge Condition: Stable CODE STATUS: FULL Diet recommendation: Heart Healthy   Brief/Interim Summary: 64 y.o.malewith medical history ofchronic respiratory failure on 2.5 L, COPD, chronic back pain, chronic abdominal pain, coronary artery disease, CKD stage III, diastolic CHF, hypertension, diet-controlled diabetes mellitus and TIA presenting with 1 week history of shortness of breath that has been worsening over the past 3 to 4 days. He has subjective fevers without any chills, nausea, vomiting, diarrhea, hematochezia, melena. He has had orthopnea type symptoms.The patient has chronic abdominal pain which she states has not changed.  He did complain of loose stools for the past week without hematochezia or melena. There is no dysuria or hematuria. The patient also states that he has chest pain "24/7"that is constant without any change. He is unable to relate to me any exacerbating or alleviating factors. The patient tried to use his home nebulizers without improvement. As result, he presented for further evaluation. Notably, the patient had a recent hospital admission from 11/23/2019 to 12/04/2019 secondary to acute diastolic CHF. He also EGD at that time secondary to nausea and vomiting which revealed nonbleeding gastric and duodenal ulcers with duodenal stenosis without obstruction. In the emergency department, the patient was afebrile hemodynamically stable with oxygen saturation 92-94% on 2.5 L. BMP, LFTs, and CBC were essentially unremarkable with his serum creatinine at his usual baseline. Hemoglobin was 11.3 which is at  his usual baseline. CT abdomen/pelvis showed mild thickening of the mid to distal sigmoid colonWith his rectum mildly distended. UA was negative for pyuria. Chest x-ray showed increased interstitial markings.  Discharge Diagnoses:  Acute on chronic respiratory failure with hypoxia -Secondary to CHF>> COPD exacerbation -Currently stable on3L -Wean oxygen as tolerated back to baseline  Acute on chronic diastolic CHF -Continue IV furosemide>>>d/c home with lasix 40 mg po daily -Daily weights -Accurate I's and O's--neg 3L -11/24/2019 echo EF 55 to 60%, no WMA, moderate TR  Sigmoid colitis -Suspect ischemic colitis -Continue ceftriaxone and metronidazole -GI consult appreciated-->colonoscopy after full recovery from colitis -Full liquid diet started but pt wanted "real food">>carb modified -check stool pathogen panel--pending at time of d/c -d/c home with cipro and flagyl x 7 more days  Gastritis/esophagitis -Continue PPI bid -11/30/2019 EGD--grade D esophagitis, gastritis, nonbleeding gastric and duodenal ulcers, mild proximal duodenal stenosis without obstruction  COPD exacerbation -Continue Pulmicort -Continue duo nebs -Continue prednisone p.o.-->3 more days after d/c  CKD stage IIIa -Baseline creatinine 1.2-1.4 -BMP monitor with diuresis -serum creatinine 1.49 on day of d/c  Diabetes mellitus type 2, diet controlled -11/24/2019 hemoglobin A1c 6.7  Essential hypertension -Continue metoprolol tartrate  Chronic abdominal pain -The patient follows [facility] -He has had an extensive previous work-up including EGD, colonoscopy, and numerous CT abdomen/pelvis -Has been attributable to multifactorial etiology including constipation, esophagitis, musculoskeletal, and a degree of IBS  Atypical chest pain -EKG--no concerning STT changes -troponins neg     Discharge Instructions   Allergies as of 02/14/2020   No Known Allergies     Medication List     TAKE these medications   acetaminophen 500 MG tablet Commonly known as: TYLENOL Take 500 mg by mouth daily.   albuterol (  2.5 MG/3ML) 0.083% nebulizer solution Commonly known as: PROVENTIL Take 3 mLs (2.5 mg total) by nebulization every 4 (four) hours as needed for wheezing or shortness of breath.   albuterol 108 (90 Base) MCG/ACT inhaler Commonly known as: VENTOLIN HFA Inhale 2 puffs into the lungs every 4 (four) hours as needed for wheezing or shortness of breath. Do not use with nebulizer   calcium carbonate 500 MG chewable tablet Commonly known as: TUMS - dosed in mg elemental calcium Chew 2 tablets (400 mg of elemental calcium total) by mouth 3 (three) times daily as needed for indigestion or heartburn.   ciprofloxacin 500 MG tablet Commonly known as: CIPRO Take 1 tablet (500 mg total) by mouth 2 (two) times daily.   furosemide 40 MG tablet Commonly known as: LASIX Take 1 tablet (40 mg total) by mouth daily. Start taking on: February 15, 2020   gabapentin 300 MG capsule Commonly known as: NEURONTIN Take 1 capsule (300 mg total) by mouth 2 (two) times daily. Pt. Says he is taking twice daily. What changed: additional instructions   guaiFENesin 600 MG 12 hr tablet Commonly known as: Mucinex Take 1 tablet (600 mg total) by mouth 2 (two) times daily. What changed: when to take this   linaclotide 72 MCG capsule Commonly known as: Linzess Take 1 capsule (72 mcg total) by mouth daily before breakfast. What changed:   when to take this  reasons to take this   metoprolol tartrate 25 MG tablet Commonly known as: LOPRESSOR Take 0.5 tablets (12.5 mg total) by mouth 2 (two) times daily.   metroNIDAZOLE 500 MG tablet Commonly known as: FLAGYL Take 1 tablet (500 mg total) by mouth every 8 (eight) hours.   MiraLax 17 GM/SCOOP powder Generic drug: polyethylene glycol powder Take 17 g by mouth daily as needed (FOR CONSTIPATION).   OXYGEN Inhale 2.5 L into the lungs daily.    pantoprazole 40 MG tablet Commonly known as: PROTONIX Take 1 tablet (40 mg total) by mouth 2 (two) times daily before a meal.   predniSONE 50 MG tablet Commonly known as: DELTASONE Take 1 tablet (50 mg total) by mouth daily with breakfast. Start taking on: February 15, 2020   Trelegy Ellipta 100-62.5-25 MCG/INH Aepb Generic drug: Fluticasone-Umeclidin-Vilant Inhale 1 puff into the lungs daily.       No Known Allergies  Consultations:  GI   Procedures/Studies: DG Chest 2 View  Result Date: 02/12/2020 CLINICAL DATA:  pt reports shortness of breath this am and reports used inhaler and nebulizer with no relief. Hx stroke, HTN, diabetes, COPD, asthma, MI, former smoker EXAM: CHEST - 2 VIEW COMPARISON:  Chest radiograph 02/02/2020 FINDINGS: Stable cardiomediastinal contours. Normal heart size. Chronic bilateral coarsening of the interstitium. Scattered atelectasis. No new focal consolidation. No pneumothorax or pleural effusion. No acute finding in the visualized skeleton. IMPRESSION: No acute cardiopulmonary finding. Chronic bronchitic change, superimposed acute component difficult to exclude. Electronically Signed   By: Audie Pinto M.D.   On: 02/12/2020 10:16   DG Chest 2 View  Result Date: 02/02/2020 CLINICAL DATA:  Shortness of breath. EXAM: CHEST - 2 VIEW COMPARISON:  December 02, 2019 FINDINGS: Mild diffuse chronic appearing increased lung markings are seen which is unchanged in severity when compared to the prior study. There is no evidence of acute infiltrate, pleural effusion or pneumothorax. The heart size and mediastinal contours are within normal limits. A radiopaque fusion plate and screws are seen overlying the lower cervical spine. IMPRESSION: No active cardiopulmonary  disease. Electronically Signed   By: Virgina Norfolk M.D.   On: 02/02/2020 17:13   CT ABDOMEN PELVIS W CONTRAST  Result Date: 02/12/2020 CLINICAL DATA:  Abdominal pain EXAM: CT ABDOMEN AND PELVIS WITH  CONTRAST TECHNIQUE: Multidetector CT imaging of the abdomen and pelvis was performed using the standard protocol following bolus administration of intravenous contrast. CONTRAST:  112m OMNIPAQUE IOHEXOL 300 MG/ML  SOLN COMPARISON:  Feb 02, 2020 FINDINGS: Lower chest: There is mild scarring in the bases. There are small pleural effusions bilaterally. No lung base edema or airspace opacity. Hepatobiliary: There is hepatic steatosis, most notably in the region of the fissure for the ligamentum teres. Liver otherwise appears unremarkable. Gallbladder wall is not appreciably thickened. There is no biliary duct dilatation. Pancreas: No pancreatic mass or inflammatory focus. Spleen: No splenic lesions are evident. Adrenals/Urinary Tract: Adrenals bilaterally appear unremarkable. Kidneys bilaterally show no evident mass or hydronephrosis on either side. There is no evident renal or ureteral calculus on either side. Urinary bladder is midline with wall thickness within normal limits. Stomach/Bowel: Rectum is mildly distended with liquid stool in fluid. No rectal wall thickening. No perirectal stranding. There remains mild wall thickening in the mid to distal sigmoid colon without surrounding mesenteric thickening or fluid. No bowel wall thickening is noted elsewhere. No evident bowel obstruction. The terminal ileum appears normal. There is no demonstrable free air or portal venous air. Vascular/Lymphatic: There is no abdominal aortic aneurysm. There is aortic and iliac artery atherosclerosis. Major venous structures appear patent. There is no evident adenopathy in the abdomen or pelvis. Reproductive: Prostate and seminal vesicles are normal in size and contour. No pelvic mass evident. Other: Appendix appears normal. No abscess or ascites evident in the abdomen or pelvis. Musculoskeletal: Multiple foci of degenerative change noted in the lumbar spine. Postoperative screw and plate fixation on the left at L3 and L4 with  support hardware intact. No blastic or lytic bone lesions. No intramuscular or abdominal wall lesions are evident. IMPRESSION: 1. Mild wall thickening in the mid to distal sigmoid colon may represent persistent degree of colitis. Rectum mildly distended with liquid stool and air without wall thickening in this area. Bowel otherwise appears unremarkable. No evident bowel obstruction. 2.  Small pleural effusions bilaterally with bibasilar scarring. 3.  There is a degree of hepatic steatosis. 4.  No abscess in the abdomen or pelvis.  Appendix appears normal. 5. No evident renal or ureteral calculus. No hydronephrosis on either side. Urinary bladder wall thickness normal. 6.  Aortic Atherosclerosis (ICD10-I70.0). Electronically Signed   By: WLowella GripIII M.D.   On: 02/12/2020 12:39   CT ABDOMEN PELVIS W CONTRAST  Result Date: 02/02/2020 CLINICAL DATA:  Abdominal pain. Neutropenia. EXAM: CT ABDOMEN AND PELVIS WITH CONTRAST TECHNIQUE: Multidetector CT imaging of the abdomen and pelvis was performed using the standard protocol following bolus administration of intravenous contrast. CONTRAST:  1023mOMNIPAQUE IOHEXOL 300 MG/ML  SOLN COMPARISON:  November 27, 2019 FINDINGS: Lower chest: The lung bases are clear. The heart size is normal. Hepatobiliary: The liver is normal. Normal gallbladder.There is no biliary ductal dilation. Pancreas: Normal contours without ductal dilatation. No peripancreatic fluid collection. Spleen: Unremarkable. Adrenals/Urinary Tract: --Adrenal glands: Unremarkable. --Right kidney/ureter: No hydronephrosis or radiopaque kidney stones. --Left kidney/ureter: No hydronephrosis or radiopaque kidney stones. --Urinary bladder: Unremarkable. Stomach/Bowel: --Stomach/Duodenum: No hiatal hernia or other gastric abnormality. Normal duodenal course and caliber. --Small bowel: Unremarkable. --Colon: There is some questionable mild wall thickening of the sigmoid colon. --Appendix:  Normal.  Vascular/Lymphatic: Atherosclerotic calcification is present within the non-aneurysmal abdominal aorta, without hemodynamically significant stenosis. --No retroperitoneal lymphadenopathy. --No mesenteric lymphadenopathy. --No pelvic or inguinal lymphadenopathy. Reproductive: Unremarkable Other: No ascites or free air. The abdominal wall is normal. Musculoskeletal. No acute displaced fractures. IMPRESSION: 1. There is some questionable mild wall thickening of the sigmoid colon. Correlate for signs and symptoms of colitis. 2. Normal appendix in the right lower quadrant. Aortic Atherosclerosis (ICD10-I70.0). Electronically Signed   By: Constance Holster M.D.   On: 02/02/2020 20:18         Discharge Exam: Vitals:   02/14/20 0727 02/14/20 0733  BP:    Pulse:    Resp:    Temp:    SpO2: 94% 94%   Vitals:   02/13/20 2037 02/14/20 0432 02/14/20 0727 02/14/20 0733  BP: 127/71 (!) 144/76    Pulse: 89 79    Resp: 16 16    Temp: 98.5 F (36.9 C) 98.5 F (36.9 C)    TempSrc: Oral Oral    SpO2: 94% 96% 94% 94%  Weight:      Height:        General: Pt is alert, awake, not in acute distress Cardiovascular: RRR, S1/S2 +, no rubs, no gallops Respiratory: bibasilar crackles. No wheeze.  diminshed BS Abdominal: Soft, NT, ND, bowel sounds + Extremities: no edema, no cyanosis   The results of significant diagnostics from this hospitalization (including imaging, microbiology, ancillary and laboratory) are listed below for reference.    Significant Diagnostic Studies: DG Chest 2 View  Result Date: 02/12/2020 CLINICAL DATA:  pt reports shortness of breath this am and reports used inhaler and nebulizer with no relief. Hx stroke, HTN, diabetes, COPD, asthma, MI, former smoker EXAM: CHEST - 2 VIEW COMPARISON:  Chest radiograph 02/02/2020 FINDINGS: Stable cardiomediastinal contours. Normal heart size. Chronic bilateral coarsening of the interstitium. Scattered atelectasis. No new focal consolidation. No  pneumothorax or pleural effusion. No acute finding in the visualized skeleton. IMPRESSION: No acute cardiopulmonary finding. Chronic bronchitic change, superimposed acute component difficult to exclude. Electronically Signed   By: Audie Pinto M.D.   On: 02/12/2020 10:16   DG Chest 2 View  Result Date: 02/02/2020 CLINICAL DATA:  Shortness of breath. EXAM: CHEST - 2 VIEW COMPARISON:  December 02, 2019 FINDINGS: Mild diffuse chronic appearing increased lung markings are seen which is unchanged in severity when compared to the prior study. There is no evidence of acute infiltrate, pleural effusion or pneumothorax. The heart size and mediastinal contours are within normal limits. A radiopaque fusion plate and screws are seen overlying the lower cervical spine. IMPRESSION: No active cardiopulmonary disease. Electronically Signed   By: Virgina Norfolk M.D.   On: 02/02/2020 17:13   CT ABDOMEN PELVIS W CONTRAST  Result Date: 02/12/2020 CLINICAL DATA:  Abdominal pain EXAM: CT ABDOMEN AND PELVIS WITH CONTRAST TECHNIQUE: Multidetector CT imaging of the abdomen and pelvis was performed using the standard protocol following bolus administration of intravenous contrast. CONTRAST:  139m OMNIPAQUE IOHEXOL 300 MG/ML  SOLN COMPARISON:  Feb 02, 2020 FINDINGS: Lower chest: There is mild scarring in the bases. There are small pleural effusions bilaterally. No lung base edema or airspace opacity. Hepatobiliary: There is hepatic steatosis, most notably in the region of the fissure for the ligamentum teres. Liver otherwise appears unremarkable. Gallbladder wall is not appreciably thickened. There is no biliary duct dilatation. Pancreas: No pancreatic mass or inflammatory focus. Spleen: No splenic lesions are evident. Adrenals/Urinary Tract: Adrenals bilaterally appear  unremarkable. Kidneys bilaterally show no evident mass or hydronephrosis on either side. There is no evident renal or ureteral calculus on either side. Urinary  bladder is midline with wall thickness within normal limits. Stomach/Bowel: Rectum is mildly distended with liquid stool in fluid. No rectal wall thickening. No perirectal stranding. There remains mild wall thickening in the mid to distal sigmoid colon without surrounding mesenteric thickening or fluid. No bowel wall thickening is noted elsewhere. No evident bowel obstruction. The terminal ileum appears normal. There is no demonstrable free air or portal venous air. Vascular/Lymphatic: There is no abdominal aortic aneurysm. There is aortic and iliac artery atherosclerosis. Major venous structures appear patent. There is no evident adenopathy in the abdomen or pelvis. Reproductive: Prostate and seminal vesicles are normal in size and contour. No pelvic mass evident. Other: Appendix appears normal. No abscess or ascites evident in the abdomen or pelvis. Musculoskeletal: Multiple foci of degenerative change noted in the lumbar spine. Postoperative screw and plate fixation on the left at L3 and L4 with support hardware intact. No blastic or lytic bone lesions. No intramuscular or abdominal wall lesions are evident. IMPRESSION: 1. Mild wall thickening in the mid to distal sigmoid colon may represent persistent degree of colitis. Rectum mildly distended with liquid stool and air without wall thickening in this area. Bowel otherwise appears unremarkable. No evident bowel obstruction. 2.  Small pleural effusions bilaterally with bibasilar scarring. 3.  There is a degree of hepatic steatosis. 4.  No abscess in the abdomen or pelvis.  Appendix appears normal. 5. No evident renal or ureteral calculus. No hydronephrosis on either side. Urinary bladder wall thickness normal. 6.  Aortic Atherosclerosis (ICD10-I70.0). Electronically Signed   By: Lowella Grip III M.D.   On: 02/12/2020 12:39   CT ABDOMEN PELVIS W CONTRAST  Result Date: 02/02/2020 CLINICAL DATA:  Abdominal pain. Neutropenia. EXAM: CT ABDOMEN AND PELVIS WITH  CONTRAST TECHNIQUE: Multidetector CT imaging of the abdomen and pelvis was performed using the standard protocol following bolus administration of intravenous contrast. CONTRAST:  160m OMNIPAQUE IOHEXOL 300 MG/ML  SOLN COMPARISON:  November 27, 2019 FINDINGS: Lower chest: The lung bases are clear. The heart size is normal. Hepatobiliary: The liver is normal. Normal gallbladder.There is no biliary ductal dilation. Pancreas: Normal contours without ductal dilatation. No peripancreatic fluid collection. Spleen: Unremarkable. Adrenals/Urinary Tract: --Adrenal glands: Unremarkable. --Right kidney/ureter: No hydronephrosis or radiopaque kidney stones. --Left kidney/ureter: No hydronephrosis or radiopaque kidney stones. --Urinary bladder: Unremarkable. Stomach/Bowel: --Stomach/Duodenum: No hiatal hernia or other gastric abnormality. Normal duodenal course and caliber. --Small bowel: Unremarkable. --Colon: There is some questionable mild wall thickening of the sigmoid colon. --Appendix: Normal. Vascular/Lymphatic: Atherosclerotic calcification is present within the non-aneurysmal abdominal aorta, without hemodynamically significant stenosis. --No retroperitoneal lymphadenopathy. --No mesenteric lymphadenopathy. --No pelvic or inguinal lymphadenopathy. Reproductive: Unremarkable Other: No ascites or free air. The abdominal wall is normal. Musculoskeletal. No acute displaced fractures. IMPRESSION: 1. There is some questionable mild wall thickening of the sigmoid colon. Correlate for signs and symptoms of colitis. 2. Normal appendix in the right lower quadrant. Aortic Atherosclerosis (ICD10-I70.0). Electronically Signed   By: CConstance HolsterM.D.   On: 02/02/2020 20:18     Microbiology: Recent Results (from the past 240 hour(s))  SARS Coronavirus 2 by RT PCR [facility] Nasopharyngeal Nasopharyngeal Swab     Status: None   Collection Time: 02/12/20 11:11 AM   Specimen:  Nasopharyngeal Swab  Result Value Ref Range Status   SARS  Coronavirus 2 NEGATIVE NEGATIVE Final    Comment: (NOTE) SARS-CoV-2 target nucleic acids are NOT DETECTED. The SARS-CoV-2 RNA is generally detectable in upper and lower respiratory specimens during the acute phase of infection. The lowest concentration of SARS-CoV-2 viral copies this assay can detect is 250 copies / mL. A negative result does not preclude SARS-CoV-2 infection and should not be used as the sole basis for treatment or other patient management decisions.  A negative result may occur with improper specimen collection / handling, submission of specimen other than nasopharyngeal swab, presence of viral mutation(s) within the areas targeted by this assay, and inadequate number of viral copies (<250 copies / mL). A negative result must be combined with clinical observations, patient history, and epidemiological information. Fact Sheet for Patients:   StrictlyIdeas.no Fact Sheet for Healthcare Providers: BankingDealers.co.za This test is not yet approved or cleared  by the [place] FDA and has been authorized for detection and/or diagnosis of SARS-CoV-2 by FDA under an Emergency Use Authorization (EUA).  This EUA will remain in effect (meaning this test can be used) for the duration of the COVID-19 declaration under Section 564(b)(1) of the Act, 21 U.S.C. section 360bbb-3(b)(1), unless the authorization is terminated or revoked sooner. Performed at [facility], [address], [place], [place] [address]   Urine culture     Status: None   Collection Time: 02/12/20 11:58 AM   Specimen: Urine, Clean Catch  Result Value Ref Range Status   Specimen Description   Final    URINE, CLEAN CATCH Performed at [facility], [address], [place], [place] [address]    Special Requests   Final    NONE Performed at [facility], [address] [place], [place] [address]     Culture   Final    NO GROWTH Performed at [facility], [place] [address] [place],  [address]    Report Status 02/13/2020 FINAL  Final     Labs: Basic Metabolic Panel: Recent Labs  Lab 02/12/20 0917 02/12/20 0917 02/13/20 0549 02/14/20 0516  NA 140  --  139 140  K 4.2   < > 3.6 3.8  CL 101  --  97* 99  CO2 29  --  31 32  GLUCOSE 97  --  98 105*  BUN 11  --  19 19  CREATININE 1.47*  --  1.64* 1.49*  CALCIUM 9.0  --  8.5* 8.6*  MG  --   --   --  1.6*   < > = values in this interval not displayed.   Liver Function Tests: Recent Labs  Lab 02/12/20 0917  AST 18  ALT 13  ALKPHOS 79  BILITOT 0.6  PROT 7.4  ALBUMIN 3.5   Recent Labs  Lab 02/12/20 0950  LIPASE 38   No results for input(s): AMMONIA in the last 168 hours. CBC: Recent Labs  Lab 02/12/20 0917 02/14/20 0516  WBC 8.1 14.2*  NEUTROABS 6.6  --   HGB 11.3* 10.8*  HCT 38.6* 35.9*  MCV 89.4 86.3  PLT 367 426*   Cardiac Enzymes: No results for input(s): CKTOTAL, CKMB, CKMBINDEX, TROPONINI in the last 168 hours. BNP: Invalid input(s): POCBNP CBG: No results for input(s): GLUCAP in the last 168 hours.  Time coordinating discharge:  36 minutes  Signed:  Orson Eva, DO [facility] Pager: [phone] 02/14/2020, 12:18 PM

## 2020-02-17 ENCOUNTER — Other Ambulatory Visit: Payer: Self-pay

## 2020-02-17 ENCOUNTER — Ambulatory Visit (INDEPENDENT_AMBULATORY_CARE_PROVIDER_SITE_OTHER): Payer: Medicare Other | Admitting: Pulmonary Disease

## 2020-02-17 ENCOUNTER — Encounter: Payer: Self-pay | Admitting: Pulmonary Disease

## 2020-02-17 ENCOUNTER — Other Ambulatory Visit: Payer: Self-pay | Admitting: *Deleted

## 2020-02-17 VITALS — BP 160/80 | HR 84 | Temp 97.8°F | Ht 63.0 in | Wt 122.0 lb

## 2020-02-17 DIAGNOSIS — Z7189 Other specified counseling: Secondary | ICD-10-CM | POA: Diagnosis not present

## 2020-02-17 DIAGNOSIS — J9611 Chronic respiratory failure with hypoxia: Secondary | ICD-10-CM

## 2020-02-17 DIAGNOSIS — I2723 Pulmonary hypertension due to lung diseases and hypoxia: Secondary | ICD-10-CM

## 2020-02-17 DIAGNOSIS — J432 Centrilobular emphysema: Secondary | ICD-10-CM | POA: Diagnosis not present

## 2020-02-17 DIAGNOSIS — J449 Chronic obstructive pulmonary disease, unspecified: Secondary | ICD-10-CM

## 2020-02-17 DIAGNOSIS — J9612 Chronic respiratory failure with hypercapnia: Secondary | ICD-10-CM

## 2020-02-17 NOTE — Patient Instructions (Signed)
Continue trelegy 1 puff daily, and rinse your mouth after each use  Albuterol every 4 to 6 hours as needed for cough, wheeze, chest congestion, or shortness of breath.  Can adjust your oxygen settings to keep your oxygen levels above 90%  Will arrange for referral to pulmonary rehab  Will arrange for lab test to check for inherited form of emphysema  Follow up in 4 months

## 2020-02-17 NOTE — Patient Outreach (Signed)
[facility]) Care Management  02/17/2020  Bryan [name] [birth year] [id number]   [facility] outreach to complex care patient recently discharged from hospital (transition of care)    Date of Admission:11/23/19 Diagnosis:Non ST elevation myocardial infarction (NSTEMI), acute on chronic hypoxic respiratory failure (multifactorial) pulmonary arterial hypertension (moderate-WHO Grp3), acutecongestive Heart Failure (CHF), esophagitis withnon bleedinggastric/duodenal ulcers/duodenal stenosis without obstruction Date of Discharge:12/04/19 Facility:[facility]  Bryan [name] was initially referred to [facility] on 10/07/19 as an [facility] referral for assistance with finding an affordable housing.  Referral [place] [facility] Referral Reason:Medium referral (within 10 business day outreach) - [facility], phone [phone]** Unsure if member is still active with [facility], Mentions that he needs assistance finding an affordable place with central air and heat, He lives in a house with a fireplace and is unable to use the oil heat due to his oxygen, Is there a list that member can find or a list that can be mailed to member? Discipline requested [facility] Insurance:united healthcare medicare  Transition of care completed Bryan [name] confirms he has a hospital follow up appointment with Dr Legrand [name] on Friday 02/21/20 He confirms he was seen by [name] to establish care today 01/17/20 and he will be attending pulmonary rehab  He has been encouraged to use his maintenance COPD medication and to use the emergency medication prn    Bryan [name] discusses he wants to get a new apartment He reports he is able to afford a $500 rent at this time.  He was encouraged again to continue to outreach to the [place] housing for assistance. He states he is interested in possibly relocating off of [address] in [place]  He has not received [facility] RN CM last EMMI and community resources information in the mail at  this time   Social:Bryan [name] is a 64 year old disabled male patient who liveswith his son andreceives assist from his son, sisters and brother. 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), acute congestive Heart Failure (CHF), esophagitis with non bleeding gastric/duodenal ulcers/duodenal stenosis without obstruction, nonspecific and resolved diarrhea, acute kidney injury, Diabetes (DM) type 2(HgA1c 6.7 diet managed), Hyperlipidemia (HLD), Hypertension (HTN), Chronic Kidney disease (CKD), history (hx) of alcohol abuse (quit 2010), oxygen dependent) shortness of breath (sob)    DAP:TCKFWBL walker, oxygen 2.5 L /Min, nebulizer, BP cuff, cbg monitorscales   plans [facility] RN CM will follow up with Bryan [name] within the next 7-10 business days   [facility] CM Care Plan Problem One     Most Recent Value  Care Plan Problem One  Housing improvement- COPD  Role Documenting the Problem One  Care Management Telephonic Coordinator  Care Plan for Problem One  Active  THN Long Term Goal   over the next 30 days patient will receive resources for improved housing beneficial to managing his COPDt  [facility] Long Term Goal Start Date  10/14/19  [facility] Long Term Goal Met Date  11/13/19  THN CM Short Term Goal #1   over the next 14 days patient will complete an application to the local housing authority  [facility] CM Short Term Goal #1 Start Date  10/14/19  [facility] CM Short Term Goal #1 Met Date  11/13/19    [facility] CM Care Plan Problem Two  Most Recent Value  Care Plan Problem Two  Risk for worsening symptoms of COPD, HTN, DM   Role Documenting the Problem Two  Care Management Telephonic Coordinator  Care Plan for Problem Two  Active  Interventions for Problem Two Long Term Goal   transtion of  care assesment completed,followed up on new pulmonologist appointnment, discussed  his housing concerns. reviewed letter sent to him recently with education on colitis and doctors   [facility] Long Term Goal  Over the next 45 days, patient will not be hospitalized for complications related to COPD, DM & HTN   THN Long Term Goal Start Date  02/17/20  [facility] CM Short Term Goal #1   over the next 21 days patient will be able to obtain extra oxygen tanks to prevent worsening symptoms of COPD as evidence by verbalizaton on next contact that DME received   THN CM Short Term Goal #1 Start Date  10/29/19  [facility] CM Short Term Goal #1 Met Date   11/13/19  THN CM Short Term Goal #2   over the next 14 days he will have hosptial follow up visits with his primary care provider as verbilized during outreach   [facility] CM Short Term Goal #2 Start Date  02/05/20  Interventions for Short Term Goal #2  transtion of care assesment completed,followed up on new pulmonologist appointnment, discussed  his housing concerns. reviewed letter sent to him recently with education on colitis and doctors   [facility] CM Short Term Goal #3   over the next 21 days patient will be assisted to obtain portable oxygen as evidence of verbalization during future outreach  [facility] CM Short Term Goal #3 Start Date  12/12/19  [facility] CM Short Term Goal #3 Met Date  02/05/20      [name], RN, BSN, [place] Coordinator Office number [phone] Mobile number [phone]  Main THN number [phone] Fax number [phone]

## 2020-02-17 NOTE — Progress Notes (Signed)
[place] [facility]  Chief Complaint  Patient presents with  . Follow-up    Patient was admitted to the hospital on 6/2 and got out Saturday. Patient is on 2 1/2 liters all the time. Patient has shortness of breath with exertion. Denies cough     Constitutional:  BP (!) 160/80 (BP Location: Right Arm, Patient Position: Sitting, Cuff Size: Normal)   Pulse 84   Temp 97.8 F (36.6 C) (Temporal)   Ht _0  (1.6 m)   Wt 122 lb (55.3 kg)   SpO2 100%   BMI 21.61 kg/m   Past Medical History:  Chronic back and abdominal pain, CAD, CKD 3, diastolic CHF, HTN, DM type 2, TIA  Summary:  Bryan [name] is a 64 y.o. male former smoker with COPD and chronic hypoxic respiratory failure.  Subjective:   He was diagnosed with emphysema when he was 73.  His mother had emphysema in her early 87's.  He is not sure if he had A1AT level checked before.  He quit smoking about 5 yrs ago.  He was in hospital recently, and breathing improved since then.  Currently not having cough, wheeze, sputum, fever, hemoptysis, chest pain, or leg swelling.  Used 2.5 liters oxygen.  SpO2 can get into mid 80's with exertion.  He doesn't do much activity at home.  Uses trelegy daily.  He uses albuterol 4 times per day because he thought he had to.  He isn't sure if he had pneumonia shot before.  He hasn't received COVID vaccine yet.  Physical Exam:   Appearance - well kempt, wearing oxygen  ENMT - no sinus tenderness, no nasal discharge, no oral exudate, Mallampati 2  Respiratory - decreased BS, prolong exhalation, no wheeze, or rales  CV - regular rate and rhythm, no murmurs  GI - soft, non tender  Lymph - no adenopathy noted in neck  Ext - no edema  Skin - no rashes  Neuro - normal strength, oriented x 3  Psych - normal mood and affect   Assessment/Plan:   COPD with emphysema. - continue trelegy - advised he can use albuterol as needed - check A1AT level -  arrange for pulmonary rehab - discussed importance of getting pneumococcal and influenza vaccinations  Chronic hypoxic/hypercapnic respiratory failure. - goal SpO2 > 90% - currently using 2.5 liters 24/7  COVID advice. - discussed pro's/con's of COVID vaccines and advised him to get vaccinated  WHO group 3 pulmonary hypertension. - 2nd to COPD and hypoxia - continue supplemental oxygen  A total of 46 minutes addressing patient care on the day of the visit.  Follow up:  Patient Instructions  Continue trelegy 1 puff daily, and rinse your mouth after each use  Albuterol every 4 to 6 hours as needed for cough, wheeze, chest congestion, or shortness of breath.  Can adjust your oxygen settings to keep your oxygen levels above 90%  Will arrange for referral to pulmonary rehab  Will arrange for lab test to check for inherited form of emphysema  Follow up in 4 months   Signature:  Chesley Mires, MD [place] Pager: [phone] 02/17/2020, 10:12 AM  Flow Sheet     Pulmonary tests:   Spirometry 09/25/15 >> FEV1 0.5 (21%), FEV1% 45  ABG on RA 09/16/18 >> pH 7.40, PCO2 51.1, PO2 48   Chest imaging:   CT angio chest 11/23/19 >> centrilobular emphysema  Cardiac tests:   Echo 11/24/19 >> EF 55 to  60%, grade 1 DD, RVSP 60.5 mmHg  RHC/LHC 12/03/19 >> non obstructive CAD, PCW nl, LVEDP nl, WHO group 3 pulmonary HTN  Medications:   Allergies as of 02/17/2020   No Known Allergies     Medication List       Accurate as of February 17, 2020 10:12 AM. If you have any questions, ask your nurse or doctor.        STOP taking these medications   predniSONE 50 MG tablet Commonly known as: DELTASONE Stopped by: Chesley Mires, MD     TAKE these medications   acetaminophen 500 MG tablet Commonly known as: TYLENOL Take 500 mg by mouth daily.   albuterol (2.5 MG/3ML) 0.083% nebulizer solution Commonly known as: PROVENTIL Take 3 mLs (2.5 mg total) by nebulization  every 4 (four) hours as needed for wheezing or shortness of breath.   albuterol 108 (90 Base) MCG/ACT inhaler Commonly known as: VENTOLIN HFA Inhale 2 puffs into the lungs every 4 (four) hours as needed for wheezing or shortness of breath. Do not use with nebulizer   calcium carbonate 500 MG chewable tablet Commonly known as: TUMS - dosed in mg elemental calcium Chew 2 tablets (400 mg of elemental calcium total) by mouth 3 (three) times daily as needed for indigestion or heartburn.   ciprofloxacin 500 MG tablet Commonly known as: CIPRO Take 1 tablet (500 mg total) by mouth 2 (two) times daily.   furosemide 40 MG tablet Commonly known as: LASIX Take 1 tablet (40 mg total) by mouth daily.   gabapentin 300 MG capsule Commonly known as: NEURONTIN Take 1 capsule (300 mg total) by mouth 2 (two) times daily. Pt. Says he is taking twice daily. What changed: additional instructions   guaiFENesin 600 MG 12 hr tablet Commonly known as: Mucinex Take 1 tablet (600 mg total) by mouth 2 (two) times daily. What changed: when to take this   linaclotide 72 MCG capsule Commonly known as: Linzess Take 1 capsule (72 mcg total) by mouth daily before breakfast. What changed:   when to take this  reasons to take this   metoprolol tartrate 25 MG tablet Commonly known as: LOPRESSOR Take 0.5 tablets (12.5 mg total) by mouth 2 (two) times daily.   metroNIDAZOLE 500 MG tablet Commonly known as: FLAGYL Take 1 tablet (500 mg total) by mouth every 8 (eight) hours.   MiraLax 17 GM/SCOOP powder Generic drug: polyethylene glycol powder Take 17 g by mouth daily as needed (FOR CONSTIPATION).   OXYGEN Inhale 2.5 L into the lungs daily.   pantoprazole 40 MG tablet Commonly known as: PROTONIX Take 1 tablet (40 mg total) by mouth 2 (two) times daily before a meal.   Trelegy Ellipta 100-62.5-25 MCG/INH Aepb Generic drug: Fluticasone-Umeclidin-Vilant Inhale 1 puff into the lungs daily.       Past  Surgical History:  He  has a past surgical history that includes Esophagogastroduodenoscopy (1/06); Neck surgery (10/2007); Colonoscopy (Feb 2012); Colonoscopy with esophagogastroduodenoscopy (egd) (N/A, 11/18/2013); Colonoscopy with propofol (N/A, 05/25/2017); Esophagogastroduodenoscopy (egd) with propofol (N/A, 05/25/2017); maloney dilation (N/A, 05/25/2017); polypectomy (05/25/2017); Back surgery (7/05; 5/09 ); Back surgery; Transurethral resection of prostate (N/A, 08/17/2017); Esophagogastroduodenoscopy (egd) with propofol (Left, 11/30/2019); and RIGHT/LEFT HEART CATH AND CORONARY ANGIOGRAPHY (N/A, 12/03/2019).  Family History:  His family history includes Asthma in his mother; Cancer in his father.  Social History:  He  reports that he quit smoking about 5 years ago. His smoking use included cigarettes. He has a 20.00 pack-year smoking  history. He has never used smokeless tobacco. He reports previous drug use. Drug: Marijuana. He reports that he does not drink alcohol.

## 2020-02-24 ENCOUNTER — Other Ambulatory Visit: Payer: Self-pay | Admitting: *Deleted

## 2020-02-24 ENCOUNTER — Other Ambulatory Visit: Payer: Self-pay

## 2020-02-24 NOTE — Patient Outreach (Signed)
[facility]) Care Management  02/24/2020  Bryan [name] 1955-11-07 [id number]   THNoutreach to complex care patient recently discharged from hospital (transition of care)    Date of Admission:11/23/19 Diagnosis:Non ST elevation myocardial infarction (NSTEMI), acute on chronic hypoxic respiratory failure (multifactorial) pulmonary arterial hypertension (moderate-WHO Grp3), acutecongestive Heart Failure (CHF), esophagitis withnon bleedinggastric/duodenal ulcers/duodenal stenosis without obstruction Date of Discharge:12/04/19 Facility:[facility]  Bryan [name] was initially referred to [facility] on 10/07/19 as an [facility] referral for assistance with finding an affordable housing.  Referral [place] [facility] Referral Reason:Medium referral (within 10 business day outreach) - [facility], phone [phone]** Unsure if member is still active with [facility], Mentions that he needs assistance finding an affordable place with central air and heat, He lives in a house with a fireplace and is unable to use the oil heat due to his oxygen, Is there a list that member can find or a list that can be mailed to member? Discipline requested [facility] LSW Insurance:united healthcare medicare   February 24 2020 follow up  Bryan [name] is able to verify HIPAA ([place] and Accountability Act) identifiers, date of birth (DOB) and address Reviewed the purpose of the call -follow up on resources sent via mail and his outreach to housing department Bryan [name] confirms he received resources via mail but has not outreached to housing  He was encouraged to call [place] housing  He is not at home that the time of this outreach  He reports he is doing well without concerns today    Social:Bryan [name] is a 64 year old disabled male patient who liveswith his son andreceives assist from his son, sisters and brother. He has 2 children andis independent with his care needs and  transportation to Medical appointments  Conditions:Non ST elevation myocardial infarction (NSTEMI), acute on chronic hypoxic respiratory failure (multifactorial) pulmonary arterial hypertension (moderate-WHO Grp3), acutecongestive Heart Failure (CHF), esophagitis withnon bleedinggastric/duodenal ulcers/duodenal stenosis without obstruction, nonspecific and resolved diarrhea, acute kidney injury, Diabetes (DM) type 2(HgA1c 6.7 diet managed),Hyperlipidemia (HLD),Hypertension (HTN),Chronic Kidney disease (CKD),history (hx)of alcohol abuse (quit 2010), oxygen dependent)shortness of breath (sob)   OTR:RNHAFBX walker,oxygen2.5L /Min, nebulizer, BP cuff, cbg monitorscales   plans [facility] RN CM will follow up with Bryan Pietrzak within the next 7-10 business days  Pt encouraged to return a call to [facility] RN CM prn Routed note to MD  [facility] CM Care Plan Problem One     Most Recent Value  Care Plan Problem One Housing improvement- COPD  Role Documenting the Problem One Care Management Telephonic Coordinator  Care Plan for Problem One Active  THN Long Term Goal  over the next 30 days patient will receive resources for improved housing beneficial to managing his COPDt  [facility] Long Term Goal Start Date 10/14/19  [facility] Long Term Goal Met Date 11/13/19  THN CM Short Term Goal #1  over the next 14 days patient will complete an application to the local housing authority  [facility] CM Short Term Goal #1 Start Date 10/14/19  [facility] CM Short Term Goal #1 Met Date 11/13/19    [facility] CM Care Plan Problem Two     Most Recent Value  Care Plan Problem Two Risk for worsening symptoms of COPD, HTN, DM   Role Documenting the Problem Two Care Management Telephonic Coordinator  Care Plan for Problem Two Active  Interventions for Problem Two Long Term Goal  continued transition care coordination assessed for worsening symptoms f/u  with pcp, f/u with housing  [facility] Long Term Goal Over the next 45 days, patient will not be  hospitalized for complications related to COPD, DM & HTN   THN Long Term Goal Start Date 02/17/20  THN CM Short Term Goal #1  over the next 21 days patient will be able to obtain extra oxygen tanks to prevent worsening symptoms of COPD as evidence by verbalizaton on next contact that DME received   THN CM Short Term Goal #1 Start Date 10/29/19  [facility] CM Short Term Goal #1 Met Date  11/13/19  THN CM Short Term Goal #2  over the next 14 days he will have hosptial follow up visits with his primary care provider as verbilized during outreach   [facility] CM Short Term Goal #2 Start Date 02/05/20  [facility] CM Short Term Goal #2 Met Date 02/24/20  THN CM Short Term Goal #3  over the next 21 days patient will be assisted to obtain portable oxygen as evidence of verbalization during future outreach  [facility] CM Short Term Goal #3 Start Date 12/12/19  [facility] CM Short Term Goal #3 Met Date 02/05/20     [name], RN, BSN, [place] Coordinator Office number [phone] Mobile number [phone]  Main THN number [phone] Fax number [phone]

## 2020-03-02 ENCOUNTER — Other Ambulatory Visit: Payer: Self-pay | Admitting: *Deleted

## 2020-03-02 NOTE — Patient Outreach (Signed)
[facility]) Care Management  03/02/2020  Bryan [name] 1956/09/12 [id number]   THNoutreach to complex care patientrecently discharged from hospital (transition of care)  Date of Admission:11/23/19 Diagnosis:Non ST elevation myocardial infarction (NSTEMI), acute on chronic hypoxic respiratory failure (multifactorial) pulmonary arterial hypertension (moderate-WHO Grp3), acutecongestive Heart Failure (CHF), esophagitis withnon bleedinggastric/duodenal ulcers/duodenal stenosis without obstruction Date of Discharge:12/04/19 Facility:[place] [facility]  Bryan [name] was initially referred to [facility] on 10/07/19 as an [facility] UM referral for assistance with finding an affordable housing.  Referral [place] UM Referral Reason:Medium referral (within 10 business day outreach) - [facility], phone [phone]** Unsure if member is still active with [facility], Mentions that he needs assistance finding an affordable place with central air and heat, He lives in a house with a fireplace and is unable to use the oil heat due to his oxygen, Is there a list that member can find or a list that can be mailed to member? Discipline requested [facility] Insurance:united healthcare medicare   March 02 2020 follow up  Bryan Rajah is able to verify HIPAA ([place] and Accountability Act) identifiers, date of birth (DOB) and address Reviewed the purpose of the call -follow up on resources sent via mail and his outreach to housing department  Chronic obstructive pulmonary disease (COPD) worsening symptoms Bryan Culp reports increased shortness of breath today with tightness in his chest, not being able to cough up mucus, shortness of breath at rest, and "heartburn feeling" in his chest.  He has used his Trelegy, his nebulizer maintenance dose and his emergency dose of medicine, albuterol with brief periods of relief.  He confirms he did complete ordered doses of Cipro and flagyl.  He has increased his oxygen to 3 L He was reminded of that [name] recommended he could increase his oxygen to maintain his oxygen saturation > 90%. He is not noted with signs of confusion. Denies fever, chills He is maintains his recommended hospital 60 ounce fluid restriction , lasix 40 mg daily, Mucinex 600 mg and reports when increasing his oxygen it dries his nares, making it more difficult to breath [facility] RN CM discussed an action plan to follow per the notes from [name] last visit plus for Bryan Gittleman to change his oxygen tubing, apply Vaseline in his nares and to maintain hydration  He also voices concern of when pulmonary rehab is to start  Social:Bryan [name] is a 64 year old disabled male patient who liveswith his son andreceives assist from his son, sisters and brother. He has 2 children andis independent with his care needs and transportation to Medical appointments  Conditions:Non ST elevation myocardial infarction (NSTEMI), acute on chronic hypoxic respiratory failure (multifactorial) pulmonary arterial hypertension (moderate-WHO Grp3), acutecongestive Heart Failure (CHF), esophagitis withnon bleedinggastric/duodenal ulcers/duodenal stenosis without obstruction, nonspecific and resolved diarrhea, acute kidney injury, Diabetes (DM) type 2(HgA1c 6.7 diet managed),Hyperlipidemia (HLD),Hypertension (HTN),Chronic Kidney disease (CKD),history (hx)of alcohol abuse (quit 2010), oxygen dependent)shortness of breath (sob)   TKW:IOXBDZH walker,oxygen2.5L /Min, nebulizer, BP cuff, cbg monitorscales   plans [facility] RN CM will follow up with Bryan Weathers within the next 7-10 business days Pt encouraged to return a call to [facility] RN CM prn [facility] RN CM will speak with staff at [name] office on March 03 2020    [facility] CM Care Plan Problem One     Most Recent Value  Care Plan Problem One Housing improvement- COPD  Role Documenting the Problem One Care Management  Telephonic Coordinator   Care Plan for Problem One Active  THN Long Term Goal  over the next 30 days patient will receive resources for improved housing beneficial to managing his COPDt  [facility] Long Term Goal Start Date 10/14/19  [facility] Long Term Goal Met Date 11/13/19  THN CM Short Term Goal #1  over the next 14 days patient will complete an application to the local housing authority  [facility] CM Short Term Goal #1 Start Date 10/14/19  [facility] CM Short Term Goal #1 Met Date 11/13/19    [facility] CM Care Plan Problem Two     Most Recent Value  Care Plan Problem Two Risk for worsening symptoms of COPD, HTN, DM   Role Documenting the Problem Two Care Management Telephonic Coordinator  Care Plan for Problem Two Active  Interventions for Problem Two Long Term Goal  assessed symptoms, reviewed with him his treatment plan per [name], answered questions, made recommendations to follow his action plan, [name] office to be notified   [place] Term Goal Over the next 45 days, patient will not be hospitalized for complications related to COPD, DM & HTN   THN Long Term Goal Start Date 02/17/20  THN CM Short Term Goal #1  over the next 21 days patient will be able to obtain extra oxygen tanks to prevent worsening symptoms of COPD as evidence by verbalizaton on next contact that DME received   THN CM Short Term Goal #1 Start Date 10/29/19  [facility] CM Short Term Goal #1 Met Date  11/13/19  THN CM Short Term Goal #2  over the next 14 days he will have hosptial follow up visits with his primary care provider as verbilized during outreach   [facility] CM Short Term Goal #2 Start Date 02/05/20  [facility] CM Short Term Goal #2 Met Date 02/24/20  THN CM Short Term Goal #3  over the next 21 days patient will be assisted to obtain portable oxygen as evidence of verbalization during future outreach  [facility] CM Short Term Goal #3 Start Date 12/12/19  [facility] CM Short Term Goal #3 Met Date 02/05/20      [name], RN, BSN, [place]  Coordinator Office number [phone] Mobile number [phone]  Main THN number [phone] Fax number [phone]

## 2020-03-03 ENCOUNTER — Other Ambulatory Visit: Payer: Self-pay | Admitting: *Deleted

## 2020-03-03 ENCOUNTER — Telehealth: Payer: Self-pay | Admitting: Pulmonary Disease

## 2020-03-03 ENCOUNTER — Other Ambulatory Visit: Payer: Self-pay

## 2020-03-03 MED ORDER — PREDNISONE 10 MG PO TABS: ORAL_TABLET | ORAL | 0 refills | Status: DC

## 2020-03-03 NOTE — Telephone Encounter (Signed)
error

## 2020-03-03 NOTE — Patient Outreach (Addendum)
[place] [facility]) Care Management  03/03/2020  Bryan [name] 10/31/1955 [id number]   [place] coordination-collaboration with pulmonology office about increased symptoms  03/03/20 1010 left a message at [name] office with Ronny Bacon for MD RN about patient reported increased symptoms on 03/02/20 Informed patient to be contacted by MD RN  03/04/20 1041THN RN CM spoke with [name] to see if he did okay during the night He reports feeling a little better this morning after following the action plan and remaining hydrated   No noted sounds of congestion audible via phone this am  He reports he has spoken with [name] RN who will speak with [name] and return a call to him He was encouraged to keep his respiratory supplies clean He reports he will change the filter of his concentrator but has already changed the tubing He still inquires about the pulmonary rehab sessions  03/03/20 1047 spoke with [name] staff Aniceto Boss about patient inquiries about pulmonary rehab [facility] RN CM informed a message was sent  Plan [facility] RN CM will follow up with [name] within the next 7-10 business days   Osgood L. [name], RN, BSN, [place] Coordinator Office number [phone] Mobile number [phone]  Main THN number [phone] Fax number [phone]

## 2020-03-03 NOTE — Telephone Encounter (Signed)
Also can reach out to Kim-THN Nurse at [phone]

## 2020-03-03 NOTE — Telephone Encounter (Signed)
Called and spoke with pt letting him know the info stated by [facility] and he verbalized understanding. Verified pt's preferred pharmacy and sent pred taper to pharmacy for pt. Nothing further needed.

## 2020-03-03 NOTE — Telephone Encounter (Signed)
Make sure he is using Trelegy daily. Can use albuterol 4 times a day as needed for breakthrough shrotness of breath/wheezing. Please send in prednisone taper for COPD with emphyseam- suspected exacerbation (38m x 2 days; 324mx 2 days; 2076m 2 days; 51m32m2 days). Have him use saline nasal spray twice daily and can use ARY nasal gel to help with nasal dryness. If he has increased weight or has leg swelling he should contact cardiology if he is established with then, if not then PCP

## 2020-03-03 NOTE — Telephone Encounter (Signed)
Spoke with pt. States that he is still having issues with DOE. Denies chest tightness, wheezing or coughing. Pt is currently on 2.5L/min but this is causing him to "dry out." He is currently also taking Lasix 39m daily and Mucinex 6073mdaily but minimal relief with his shortness of breath. Pt would like recommendations.  Beth - please advise. Thanks.

## 2020-03-10 ENCOUNTER — Other Ambulatory Visit: Payer: Self-pay

## 2020-03-10 ENCOUNTER — Other Ambulatory Visit: Payer: Self-pay | Admitting: *Deleted

## 2020-03-10 NOTE — Patient Outreach (Signed)
[facility]) Care Management  03/10/2020  Bryan [name] September 27, 1955 [id number]   THNoutreach to complex care patientrecently discharged from hospital (transition of care)  Date of Admission:11/23/19 Diagnosis:Non ST elevation myocardial infarction (NSTEMI), acute on chronic hypoxic respiratory failure (multifactorial) pulmonary arterial hypertension (moderate-WHO Grp3), acutecongestive Heart Failure (CHF), esophagitis withnon bleedinggastric/duodenal ulcers/duodenal stenosis without obstruction Date of Discharge:12/04/19 Facility: hospital  Bryan [name] was initially referred to [facility] on 10/07/19 as an [facility] UM referral for assistance with finding an affordable housing.  Referral [place] UM Referral Reason:Medium referral (within 10 business day outreach) - [facility], phone [phone]** Unsure if member is still active with [facility], Mentions that he needs assistance finding an affordable place with central air and heat, He lives in a house with a fireplace and is unable to use the oil heat due to his oxygen, Is there a list that member can find or a list that can be mailed to member? Discipline requested [facility] Insurance:united healthcare medicare   March 10 2020 follow up  Bryan [name] able to verify HIPAA ([place] and Accountability Act) identifiers, date of birth (DOB) and address Reviewedthe purpose of the call -follow up on resources sent via mail and his outreach to housing department  Chronic obstructive pulmonary disease (COPD) improved symptoms Bryan [name] reports he is following the pullmonology action plan from [name] last visit plus has changed his oxygen tubing, apply Vaseline in his nares and maintained hydration    Social:Bryan [name] is a 64 year old disabled male patient who liveswith his son andreceives assist from his son, sisters and brother. He has 2 children andis independent with his care needs  and transportation to Medical appointments  Conditions:Non ST elevation myocardial infarction (NSTEMI), acute on chronic hypoxic respiratory failure (multifactorial) pulmonary arterial hypertension (moderate-WHO Grp3), acutecongestive Heart Failure (CHF), esophagitis withnon bleedinggastric/duodenal ulcers/duodenal stenosis without obstruction, nonspecific and resolved diarrhea, acute kidney injury, Diabetes (DM) type 2(HgA1c 6.7 diet managed),Hyperlipidemia (HLD),Hypertension (HTN),Chronic Kidney disease (CKD),history (hx)of alcohol abuse (quit 2010), oxygen dependent)shortness of breath (sob)   RFX:JOITGPQ walker,oxygen2.5L /Min, nebulizer, BP cuff, cbg monitorscales   plans [facility] RN CM will follow up with Bryan [name] within the next 21-30 business days Pt encouraged to return a call to [facility] RN CM prn  Bryan Steelman L. [name], RN, BSN, [place] Coordinator Office number [phone] Mobile number [phone]  Main THN number [phone] Fax number [phone]

## 2020-03-11 ENCOUNTER — Telehealth: Payer: Self-pay | Admitting: *Deleted

## 2020-03-11 NOTE — Telephone Encounter (Signed)
Called pt. appt scheduled for EGD with propofol with RMR 04/13/2020 at 9:45am. Patient aware will need pre-op/covid test appt prior. Advised will provide instructions with appts at his scheduled appt here Friday. He voiced understanding.    PA for EGD approved via [facility] website. Auth# [id number] dates 04/13/2020-07/12/2020

## 2020-03-13 ENCOUNTER — Ambulatory Visit (INDEPENDENT_AMBULATORY_CARE_PROVIDER_SITE_OTHER): Payer: Medicare Other | Admitting: Gastroenterology

## 2020-03-13 ENCOUNTER — Encounter: Payer: Self-pay | Admitting: Gastroenterology

## 2020-03-13 ENCOUNTER — Encounter: Payer: Self-pay | Admitting: *Deleted

## 2020-03-13 ENCOUNTER — Other Ambulatory Visit: Payer: Self-pay

## 2020-03-13 ENCOUNTER — Telehealth: Payer: Self-pay | Admitting: *Deleted

## 2020-03-13 VITALS — BP 170/92 | HR 107 | Temp 97.1°F | Ht 63.0 in | Wt 123.8 lb

## 2020-03-13 DIAGNOSIS — R1084 Generalized abdominal pain: Secondary | ICD-10-CM

## 2020-03-13 NOTE — Patient Instructions (Addendum)
Please have blood work done today. Please go to the emergency room if worsening symptoms.  Keep appointment for upper endoscopy upcoming. We will try to group the colonoscopy with this but may not be able to. You will need a colonoscopy at some point in the future, which we will try to arrange at time of endoscopy or may need to be separate.  I think it's best to have you call Dr. Legrand Rams and get in for an appointment as soon as you can. Please call him today and update about your blood pressure medications. I am not sure what he wants you on, but you need to have this controlled.   Further recommendations to follow!  I enjoyed seeing you again today! As you know, I value our relationship and want to provide genuine, compassionate, and quality care. I welcome your feedback. If you receive a survey regarding your visit,  I greatly appreciate you taking time to fill this out. See you next time!  [name], PhD, ANP-BC [facility]

## 2020-03-13 NOTE — Telephone Encounter (Signed)
Called pt, VM full. He needs TCS with EGD with propofol with RMR.

## 2020-03-13 NOTE — Progress Notes (Addendum)
Referring Provider: Rosita Fire, MD Primary Care Physician:  Rosita Fire, MD Primary GI: Dr. Gala Romney   Chief Complaint  Patient presents with  . Abdominal Pain    "cutting pain", constant, never stops    HPI:   Bryan [name] is a 64 y.o. male presenting today with a history of  chronic abdominal pain, constipation, GERD, esophagitis, dysphagiadespite empiric dilation in Sept 2018. BPE without motility issues. Abdominal pain thoroughly evaluated with HIDA, multiple CTs.Chronic abdominal pain felt multifactorial in setting of constipation, GERD/gastritis/esophagitis, possible musculoskeletal component. CT abd/pelvis with contrast March 2021 with severe wall thickening of distal esophagus, severe gastric distension with severe wall thickening of proximal duodenum. EGD while inpatient with Dr. Paulita Fujita with LA Grade D esophagitis, small hiatal hernia, one non-bleeding cratered gastric ulcer in prepyloric region of stomach, many non-bleeding cratered duodenal ulcers without stigmata of bleeding, mild luminal narrowing at apex of duodenal bulb but traversed easily. H.pylori serology negative. Surveillance EGD already scheduled for Aug 2021.   In interim from when I last saw patient in May 2021, he was inpatient June 2021 due to COPD exacerbation. CT findings with mild wall thickening in the mid to distal sigmoid colon. Treated empirically. Patient's last colonoscopy in 2018 with sigmoid diverticulosis, hyperplastic rectal polyp removed, ascending colon AVMs. Needs colonoscopy in the future.   Linzess 72 mcg every other day.  no diarrhea. No fever or chills. Abdomen started hurting again yesterday, kept him up all night. When taking a deep breath, feels like it "gets crazy". Last BM this morning, productive. No rectal bleeding. No pain after eating. Good appetite. Eats 2 meals per day. No early satiety. Feels abdomen pain is worse than when in hospital. States he hasn't been right since March.  Gallbladder present. Notes LUQ pain. Feels constant. Feels like a 10. Nothing makes feel better. Protonix BID.   Ran out of metoprolol last week.   Past Medical History:  Diagnosis Date  . Acid reflux   . Arthritis   . Asthma   . Chronic pain    with leg and back pain (disc problem)  . COPD (chronic obstructive pulmonary disease) ([place])   . Diabetes mellitus without complication ([place])   . Headache    HX OF  . History of upper GI x-ray series    to follow showed large duodenal ulcer H pylori serologies were negative  . Hypertension   . NSTEMI (non-ST elevated myocardial infarction) ([place]) 11/2019  . Pneumonia    07/17/17  . Pre-diabetes   . Stroke [facility])    TIA MINI STROKE  . Tubular adenoma     Past Surgical History:  Procedure Laterality Date  . BACK SURGERY  7/05; 5/09    Dr.Hirsch,3 lumbar  X3  . BACK SURGERY    . COLONOSCOPY  Feb 2012   Dr. Gala Romney: normal rectum, pedunculate polyp removed but not recovered  . COLONOSCOPY WITH ESOPHAGOGASTRODUODENOSCOPY (EGD) N/A 11/18/2013   Dr.Rourk- tcs= normal rectum, multipe polyps about the ileocecal valve and distal transverse colon o/w the remainder of the colonic mucosa appeared normal bx= tubular adenoma. EGD= normal esophagus, stomach with scattered erosions mottling, friablility, no ulcer or infiltrating process patent pylorus bx= chronic inflammation. next TCS 11/2016  . COLONOSCOPY WITH PROPOFOL N/A 05/25/2017   Dr. Gala Romney: sigmoid diverticulosis, one 4 mm hyerplastic rectal polyp, ascending colonic AVMs surveillance 2023  . ESOPHAGOGASTRODUODENOSCOPY  1/06   Dr. Volney American esophageal erosions,U-shaped stomach,marked erosions and edema of the bulb  without discrete ulcer disease.   . ESOPHAGOGASTRODUODENOSCOPY (EGD) WITH PROPOFOL N/A 05/25/2017   Dr. Gala Romney: reflux esophagitis s/p empiric dilation, normal stomach and duodenum  . ESOPHAGOGASTRODUODENOSCOPY (EGD) WITH PROPOFOL Left 11/30/2019   LA Grade D esophagitis, small hiatal  hernia, one non-bleeding cratered gastric ulcer in prepyloric region of stomach, many non-bleeding cratered duodenal ulcers without stigmata of bleeding, mild luminal narrowing at apex of duodenal bulb but traversed easily. H.pylori serology negative.  Marland Kitchen MALONEY DILATION N/A 05/25/2017   Procedure: Venia Minks DILATION;  Surgeon: Daneil Dolin, MD;  Location: [facility];  Service: Endoscopy;  Laterality: N/A;  . NECK SURGERY  10/2007   PLATE IN NECK  . POLYPECTOMY  05/25/2017   Procedure: POLYPECTOMY;  Surgeon: Daneil Dolin, MD;  Location: [facility];  Service: Endoscopy;;  rectal  . RIGHT/LEFT HEART CATH AND CORONARY ANGIOGRAPHY N/A 12/03/2019   Procedure: RIGHT/LEFT HEART CATH AND CORONARY ANGIOGRAPHY;  Surgeon: Nigel Mormon, MD;  Location: [place] [facility];  Service: Cardiovascular;  Laterality: N/A;  . TRANSURETHRAL RESECTION OF PROSTATE N/A 08/17/2017   Procedure: TRANSURETHRAL RESECTION OF THE PROSTATE (TURP);  Surgeon: Irine Seal, MD;  Location: [facility];  Service: Urology;  Laterality: N/A;    Current Outpatient Medications  Medication Sig Dispense Refill  . acetaminophen (TYLENOL) 500 MG tablet Take 500 mg by mouth daily.    Marland Kitchen albuterol (PROVENTIL) (2.5 MG/3ML) 0.083% nebulizer solution Take 3 mLs (2.5 mg total) by nebulization every 4 (four) hours as needed for wheezing or shortness of breath. 75 mL 5  . albuterol (VENTOLIN HFA) 108 (90 Base) MCG/ACT inhaler Inhale 2 puffs into the lungs every 4 (four) hours as needed for wheezing or shortness of breath. Do not use with nebulizer 18 g 2  . calcium carbonate (TUMS - DOSED IN MG ELEMENTAL CALCIUM) 500 MG chewable tablet Chew 2 tablets (400 mg of elemental calcium total) by mouth 3 (three) times daily as needed for indigestion or heartburn. 30 tablet 0  . Fluticasone-Umeclidin-Vilant (TRELEGY ELLIPTA) 100-62.5-25 MCG/INH AEPB Inhale 1 puff into the lungs daily. 28 each 5  . furosemide (LASIX) 40 MG tablet Take 1 tablet (40 mg  total) by mouth daily. 30 tablet 1  . gabapentin (NEURONTIN) 300 MG capsule Take 1 capsule (300 mg total) by mouth 2 (two) times daily. Pt. Says he is taking twice daily. (Patient taking differently: Take 300 mg by mouth 2 (two) times daily. )    . guaiFENesin (MUCINEX) 600 MG 12 hr tablet Take 1 tablet (600 mg total) by mouth 2 (two) times daily. (Patient taking differently: Take 600 mg by mouth daily. ) 60 tablet 2  . linaclotide (LINZESS) 72 MCG capsule Take 1 capsule (72 mcg total) by mouth daily before breakfast. 90 capsule 3  . OXYGEN Inhale 2.5 L into the lungs daily.    . pantoprazole (PROTONIX) 40 MG tablet Take 1 tablet (40 mg total) by mouth 2 (two) times daily before a meal. 90 tablet 0  . metoprolol tartrate (LOPRESSOR) 25 MG tablet Take 0.5 tablets (12.5 mg total) by mouth 2 (two) times daily. 30 tablet 1  . Na Sulfate-K Sulfate-Mg Sulf 17.5-3.13-1.6 GM/177ML SOLN Take 1 kit by mouth once for 1 dose. 354 mL 0   No current facility-administered medications for this visit.    Allergies as of 03/13/2020  . (No Known Allergies)    Family History  Problem Relation Age of Onset  . Cancer Father   . Asthma Mother   .  Colon cancer Neg Hx     Social History   Socioeconomic History  . Marital status: Single    Spouse name: Not on file  . Number of children: 2  . Years of education: 20  . Highest education level: High school graduate  Occupational History  . Occupation: disabled    Fish farm manager: UNEMPLOYED  Tobacco Use  . Smoking status: Former Smoker    Packs/day: 0.50    Years: 40.00    Pack years: 20.00    Types: Cigarettes    Quit date: 11/11/2014    Years since quitting: 5.3  . Smokeless tobacco: Never Used  Vaping Use  . Vaping Use: Never used  Substance and Sexual Activity  . Alcohol use: No    Alcohol/week: 0.0 standard drinks  . Drug use: Not Currently    Types: Marijuana    Comment: occas; denied 01/28/20  . Sexual activity: Not Currently    Birth  control/protection: None  Other Topics Concern  . Not on file  Social History Narrative   Lives with girlfriend and son, is on disability for history of back injuries.   Social Determinants of Health   Financial Resource Strain: Medium Risk  . Difficulty of Paying Living Expenses: Somewhat hard  Food Insecurity: No Food Insecurity  . Worried About Charity fundraiser in the Last Year: Never true  . Ran Out of Food in the Last Year: Never true  Transportation Needs: No Transportation Needs  . Lack of Transportation (Medical): No  . Lack of Transportation (Non-Medical): No  Physical Activity: Inactive  . Days of Exercise per Week: 0 days  . Minutes of Exercise per Session: 0 min  Stress: No Stress Concern Present  . Feeling of Stress : Not at all  Social Connections: Socially Isolated  . Frequency of Communication with Friends and Family: More than three times a week  . Frequency of Social Gatherings with Friends and Family: More than three times a week  . Attends Religious Services: Never  . Active Member of Clubs or Organizations: No  . Attends Archivist Meetings: Never  . Marital Status: Never married    Review of Systems: Gen: Denies fever, chills, anorexia. Denies fatigue, weakness, weight loss.  CV: Denies chest pain, palpitations, syncope, peripheral edema, and claudication. Resp: Denies dyspnea at rest, cough, wheezing, coughing up blood, and pleurisy. GI: Denies vomiting blood, jaundice, and fecal incontinence.   Denies dysphagia or odynophagia. Derm: Denies rash, itching, dry skin Psych: Denies depression, anxiety, memory loss, confusion. No homicidal or suicidal ideation.  Heme: Denies bruising, bleeding, and enlarged lymph nodes.  Physical Exam: BP (!) 183/98 repeat 170/92  Pulse (!) 107   Temp (!) 97.1 F (36.2 C)   Ht _0  (1.6 m)   Wt 123 lb 12.8 oz (56.2 kg)   BMI 21.93 kg/m  General:   Alert and oriented. No distress noted. Pleasant and  cooperative.  Head:  Normocephalic and atraumatic. Eyes:  Conjuctiva clear without scleral icterus. Mouth:  Mask in place Cardiac: S1 S2 present without murmurs Lungs: clear bilaterally Abdomen:  +BS, soft, TTP diffusely but moreso upper abdomen, without rebound or guarding. No HSM or masses noted. No distension Msk:  Symmetrical without gross deformities. Normal posture. Extremities:  Without edema. Neurologic:  Alert and  oriented x4 Psych:  Alert and cooperative. Normal mood and affect.  ASSESSMENT: ANGAD NABERS is a 64 y.o. male presenting today with history of chronic abdominal pain, GERD, recently  found to have PUD in March 2021 while hospitalized in [place] and with need for surveillance, along with recent hospitalization June 2021 for COPD exacerbation and found to have mild wall thickening in mid to distal sigmoid colon with recommend colonoscopy as outpatient.   Abdominal pain waxes and wanes, multifactorial in setting of constipation, GERD/gastritis/esophagitis, possible musculoskeletal component; although he notes worsening pain today, he appears at baseline. Labs ordered to ensure no acute process. EGD planned for PUD surveillance in Aug 2021.   Constipation: continues with Linzess 72 mcg.   GERD: Continue Protonix BID  Will plan on colonoscopy at time of EGD due to ?colitis on CT recently. Last colonoscopy in 2018 as noted above.    PLAN:  Proceed with upper endoscopy/colonoscopy in the near future with Dr. Gala Romney using Propofol. The risks, benefits, and alternatives have been discussed in detail with patient. They have stated understanding and desire to proceed.   CBC, iron studies, CMP today  Follow-up with PCP for BP managements  Follow-up Nov 2021    [name], PhD, [facility]

## 2020-03-17 ENCOUNTER — Encounter: Payer: Self-pay | Admitting: *Deleted

## 2020-03-17 ENCOUNTER — Telehealth: Payer: Self-pay

## 2020-03-17 MED ORDER — NA SULFATE-K SULFATE-MG SULF 17.5-3.13-1.6 GM/177ML PO SOLN: 1.0000 | Freq: Once | ORAL | 0 refills | Status: AC

## 2020-03-17 NOTE — Telephone Encounter (Signed)
Spoke with pt. He is scheduled for EGD/TCS with propofol 04/16/2020 at 9:00am. Patient aware will mail new prep instructions to pharmacy. Confirmed mailing address. Confirmed pharmacy.   PA for EGD approved via [facility] website. Auth# [id number] dates 04/13/2020-07/12/2020

## 2020-03-17 NOTE — Telephone Encounter (Signed)
Pt walked in office and said [facility] said there was something wrong with the lab order and he will need to pay for his labs to be drawn. I lmom for [facility] to call back. Pt may need another DX code. Called back and spoke with Vaughan Basta, she was leaving for the day and will call back tomorrow.

## 2020-03-18 ENCOUNTER — Encounter: Payer: Self-pay | Admitting: Gastroenterology

## 2020-03-19 LAB — COMPLETE METABOLIC PANEL WITH GFR
AG Ratio: 1.2 (calc) (ref 1.0–2.5)
ALT: 9 U/L (ref 9–46)
AST: 17 U/L (ref 10–35)
Albumin: 3.6 g/dL (ref 3.6–5.1)
Alkaline phosphatase (APISO): 87 U/L (ref 35–144)
BUN/Creatinine Ratio: 11 (calc) (ref 6–22)
BUN: 16 mg/dL (ref 7–25)
CO2: 30 mmol/L (ref 20–32)
Calcium: 9 mg/dL (ref 8.6–10.3)
Chloride: 104 mmol/L (ref 98–110)
Creat: 1.49 mg/dL — ABNORMAL HIGH (ref 0.70–1.25)
GFR, Est African American: 57 mL/min/{1.73_m2} — ABNORMAL LOW (ref >=60)
GFR, Est Non African American: 49 mL/min/{1.73_m2} — ABNORMAL LOW (ref >=60)
Globulin: 3 g/dL (calc) (ref 1.9–3.7)
Glucose, Bld: 91 mg/dL (ref 65–139)
Potassium: 4.7 mmol/L (ref 3.5–5.3)
Sodium: 141 mmol/L (ref 135–146)
Total Bilirubin: 0.2 mg/dL (ref 0.2–1.2)
Total Protein: 6.6 g/dL (ref 6.1–8.1)

## 2020-03-19 LAB — CBC WITH DIFFERENTIAL/PLATELET
Absolute Monocytes: 787 cells/uL (ref 200–950)
Basophils Absolute: 41 cells/uL (ref 0–200)
Basophils Relative: 0.5 %
Eosinophils Absolute: 90 cells/uL (ref 15–500)
Eosinophils Relative: 1.1 %
HCT: 37.3 % — ABNORMAL LOW (ref 38.5–50.0)
Hemoglobin: 11.2 g/dL — ABNORMAL LOW (ref 13.2–17.1)
Lymphs Abs: 1435 cells/uL (ref 850–3900)
MCH: 24.8 pg — ABNORMAL LOW (ref 27.0–33.0)
MCHC: 30 g/dL — ABNORMAL LOW (ref 32.0–36.0)
MCV: 82.7 fL (ref 80.0–100.0)
MPV: 10.9 fL (ref 7.5–12.5)
Monocytes Relative: 9.6 %
Neutro Abs: 5847 cells/uL (ref 1500–7800)
Neutrophils Relative %: 71.3 %
Platelets: 350 10*3/uL (ref 140–400)
RBC: 4.51 10*6/uL (ref 4.20–5.80)
RDW: 15.7 % — ABNORMAL HIGH (ref 11.0–15.0)
Total Lymphocyte: 17.5 %
WBC: 8.2 10*3/uL (ref 3.8–10.8)

## 2020-03-19 LAB — IRON,TIBC AND FERRITIN PANEL
%SAT: 5 % (calc) — ABNORMAL LOW (ref 20–48)
Ferritin: 21 ng/mL — ABNORMAL LOW (ref 24–380)
Iron: 16 ug/dL — ABNORMAL LOW (ref 50–180)
TIBC: 329 mcg/dL (calc) (ref 250–425)

## 2020-03-20 ENCOUNTER — Emergency Department (HOSPITAL_COMMUNITY)
Admission: EM | Admit: 2020-03-20 | Discharge: 2020-03-20 | Disposition: A | Discharge status: Home / Self Care | Payer: Medicare Other | Attending: Emergency Medicine | Admitting: Emergency Medicine

## 2020-03-20 ENCOUNTER — Encounter (HOSPITAL_COMMUNITY): Payer: Self-pay

## 2020-03-20 ENCOUNTER — Other Ambulatory Visit: Payer: Self-pay

## 2020-03-20 DIAGNOSIS — I110 Hypertensive heart disease with heart failure: Secondary | ICD-10-CM | POA: Insufficient documentation

## 2020-03-20 DIAGNOSIS — R110 Nausea: Secondary | ICD-10-CM | POA: Diagnosis not present

## 2020-03-20 DIAGNOSIS — R1013 Epigastric pain: Secondary | ICD-10-CM | POA: Diagnosis present

## 2020-03-20 DIAGNOSIS — R0789 Other chest pain: Secondary | ICD-10-CM | POA: Insufficient documentation

## 2020-03-20 DIAGNOSIS — I5033 Acute on chronic diastolic (congestive) heart failure: Secondary | ICD-10-CM | POA: Insufficient documentation

## 2020-03-20 DIAGNOSIS — J45909 Unspecified asthma, uncomplicated: Secondary | ICD-10-CM | POA: Insufficient documentation

## 2020-03-20 DIAGNOSIS — Z7951 Long term (current) use of inhaled steroids: Secondary | ICD-10-CM | POA: Insufficient documentation

## 2020-03-20 DIAGNOSIS — E119 Type 2 diabetes mellitus without complications: Secondary | ICD-10-CM | POA: Insufficient documentation

## 2020-03-20 DIAGNOSIS — N183 Chronic kidney disease, stage 3 unspecified: Secondary | ICD-10-CM | POA: Diagnosis not present

## 2020-03-20 DIAGNOSIS — Z87891 Personal history of nicotine dependence: Secondary | ICD-10-CM | POA: Diagnosis not present

## 2020-03-20 LAB — COMPREHENSIVE METABOLIC PANEL
ALT: 22 U/L (ref 0–44)
AST: 35 U/L (ref 15–41)
Albumin: 3.4 g/dL — ABNORMAL LOW (ref 3.5–5.0)
Alkaline Phosphatase: 87 U/L (ref 38–126)
Anion gap: 11 (ref 5–15)
BUN: 23 mg/dL (ref 8–23)
CO2: 23 mmol/L (ref 22–32)
Calcium: 8.7 mg/dL — ABNORMAL LOW (ref 8.9–10.3)
Chloride: 101 mmol/L (ref 98–111)
Creatinine, Ser: 1.54 mg/dL — ABNORMAL HIGH (ref 0.61–1.24)
GFR calc Af Amer: 55 mL/min — ABNORMAL LOW (ref >60)
GFR calc non Af Amer: 47 mL/min — ABNORMAL LOW (ref >60)
Glucose, Bld: 162 mg/dL — ABNORMAL HIGH (ref 70–99)
Potassium: 5.2 mmol/L — ABNORMAL HIGH (ref 3.5–5.1)
Sodium: 135 mmol/L (ref 135–145)
Total Bilirubin: 0.7 mg/dL (ref 0.3–1.2)
Total Protein: 7.5 g/dL (ref 6.5–8.1)

## 2020-03-20 LAB — CBC WITH DIFFERENTIAL/PLATELET
Abs Immature Granulocytes: 0.03 10*3/uL (ref 0.00–0.07)
Basophils Absolute: 0.1 10*3/uL (ref 0.0–0.1)
Basophils Relative: 1 %
Eosinophils Absolute: 0 10*3/uL (ref 0.0–0.5)
Eosinophils Relative: 0 %
HCT: 41.8 % (ref 39.0–52.0)
Hemoglobin: 12.1 g/dL — ABNORMAL LOW (ref 13.0–17.0)
Immature Granulocytes: 0 %
Lymphocytes Relative: 11 %
Lymphs Abs: 1.1 10*3/uL (ref 0.7–4.0)
MCH: 24.8 pg — ABNORMAL LOW (ref 26.0–34.0)
MCHC: 28.9 g/dL — ABNORMAL LOW (ref 30.0–36.0)
MCV: 85.7 fL (ref 80.0–100.0)
Monocytes Absolute: 0.6 10*3/uL (ref 0.1–1.0)
Monocytes Relative: 6 %
Neutro Abs: 8 10*3/uL — ABNORMAL HIGH (ref 1.7–7.7)
Neutrophils Relative %: 82 %
Platelets: 316 10*3/uL (ref 150–400)
RBC: 4.88 MIL/uL (ref 4.22–5.81)
RDW: 17.7 % — ABNORMAL HIGH (ref 11.5–15.5)
WBC: 9.7 10*3/uL (ref 4.0–10.5)
nRBC: 0 % (ref 0.0–0.2)

## 2020-03-20 LAB — LIPASE, BLOOD: Lipase: 46 U/L (ref 11–51)

## 2020-03-20 MED ORDER — MORPHINE SULFATE (PF) 4 MG/ML IV SOLN
4.0000 mg | Freq: Once | INTRAVENOUS | Status: AC
  Administered 2020-03-20: 4 mg via INTRAVENOUS
  Filled 2020-03-20: qty 1

## 2020-03-20 MED ORDER — FAMOTIDINE IN NACL 20-0.9 MG/50ML-% IV SOLN
20.0000 mg | Freq: Once | INTRAVENOUS | Status: AC
  Administered 2020-03-20: 20 mg via INTRAVENOUS
  Filled 2020-03-20: qty 50

## 2020-03-20 MED ORDER — LIDOCAINE VISCOUS HCL 2 % MT SOLN
15.0000 mL | Freq: Once | OROMUCOSAL | Status: AC
  Administered 2020-03-20: 15 mL via ORAL
  Filled 2020-03-20: qty 15

## 2020-03-20 MED ORDER — ALUM & MAG HYDROXIDE-SIMETH 200-200-20 MG/5ML PO SUSP
30.0000 mL | Freq: Once | ORAL | Status: AC
  Administered 2020-03-20: 30 mL via ORAL
  Filled 2020-03-20: qty 30

## 2020-03-20 NOTE — Discharge Instructions (Addendum)
Follow-up with your gastroenterologist.

## 2020-03-20 NOTE — ED Triage Notes (Signed)
Pt coming in for abdominal pain that is ongoing. It has been going on for weeks and has been seen by PCP and ED for same. States he has pain all over when he walks or using the restroom. Normal BM today. He also started having chest pain in waiting room On 2.5 L [place] chronically

## 2020-03-21 ENCOUNTER — Emergency Department (HOSPITAL_COMMUNITY): Payer: Medicare Other

## 2020-03-21 ENCOUNTER — Emergency Department (HOSPITAL_COMMUNITY)
Admission: EM | Admit: 2020-03-21 | Discharge: 2020-03-21 | Disposition: A | Discharge status: Home / Self Care | Payer: Medicare Other | Attending: Emergency Medicine | Admitting: Emergency Medicine

## 2020-03-21 ENCOUNTER — Encounter (HOSPITAL_COMMUNITY): Payer: Self-pay | Admitting: Emergency Medicine

## 2020-03-21 ENCOUNTER — Other Ambulatory Visit: Payer: Self-pay

## 2020-03-21 DIAGNOSIS — R0602 Shortness of breath: Secondary | ICD-10-CM | POA: Diagnosis present

## 2020-03-21 DIAGNOSIS — Z79899 Other long term (current) drug therapy: Secondary | ICD-10-CM | POA: Insufficient documentation

## 2020-03-21 DIAGNOSIS — Z87891 Personal history of nicotine dependence: Secondary | ICD-10-CM | POA: Diagnosis not present

## 2020-03-21 DIAGNOSIS — E119 Type 2 diabetes mellitus without complications: Secondary | ICD-10-CM | POA: Diagnosis not present

## 2020-03-21 DIAGNOSIS — I5033 Acute on chronic diastolic (congestive) heart failure: Secondary | ICD-10-CM | POA: Insufficient documentation

## 2020-03-21 DIAGNOSIS — N183 Chronic kidney disease, stage 3 unspecified: Secondary | ICD-10-CM | POA: Insufficient documentation

## 2020-03-21 DIAGNOSIS — J441 Chronic obstructive pulmonary disease with (acute) exacerbation: Secondary | ICD-10-CM | POA: Diagnosis not present

## 2020-03-21 DIAGNOSIS — J45909 Unspecified asthma, uncomplicated: Secondary | ICD-10-CM | POA: Insufficient documentation

## 2020-03-21 DIAGNOSIS — I130 Hypertensive heart and chronic kidney disease with heart failure and stage 1 through stage 4 chronic kidney disease, or unspecified chronic kidney disease: Secondary | ICD-10-CM | POA: Diagnosis not present

## 2020-03-21 LAB — CBC
HCT: 39 % (ref 39.0–52.0)
Hemoglobin: 11.4 g/dL — ABNORMAL LOW (ref 13.0–17.0)
MCH: 24.4 pg — ABNORMAL LOW (ref 26.0–34.0)
MCHC: 29.2 g/dL — ABNORMAL LOW (ref 30.0–36.0)
MCV: 83.5 fL (ref 80.0–100.0)
Platelets: 286 10*3/uL (ref 150–400)
RBC: 4.67 MIL/uL (ref 4.22–5.81)
RDW: 17.2 % — ABNORMAL HIGH (ref 11.5–15.5)
WBC: 6.8 10*3/uL (ref 4.0–10.5)
nRBC: 0 % (ref 0.0–0.2)

## 2020-03-21 LAB — BASIC METABOLIC PANEL
Anion gap: 10 (ref 5–15)
BUN: 29 mg/dL — ABNORMAL HIGH (ref 8–23)
CO2: 27 mmol/L (ref 22–32)
Calcium: 9 mg/dL (ref 8.9–10.3)
Chloride: 103 mmol/L (ref 98–111)
Creatinine, Ser: 1.83 mg/dL — ABNORMAL HIGH (ref 0.61–1.24)
GFR calc Af Amer: 45 mL/min — ABNORMAL LOW (ref >60)
GFR calc non Af Amer: 38 mL/min — ABNORMAL LOW (ref >60)
Glucose, Bld: 92 mg/dL (ref 70–99)
Potassium: 4.2 mmol/L (ref 3.5–5.1)
Sodium: 140 mmol/L (ref 135–145)

## 2020-03-21 LAB — TROPONIN I (HIGH SENSITIVITY): Troponin I (High Sensitivity): 13 ng/L (ref <18)

## 2020-03-21 LAB — BRAIN NATRIURETIC PEPTIDE: B Natriuretic Peptide: 424 pg/mL — ABNORMAL HIGH (ref 0.0–100.0)

## 2020-03-21 MED ORDER — ALBUTEROL SULFATE HFA 108 (90 BASE) MCG/ACT IN AERS: 8.0000 | INHALATION_SPRAY | RESPIRATORY_TRACT | Status: DC | PRN

## 2020-03-21 MED ORDER — ALBUTEROL SULFATE HFA 108 (90 BASE) MCG/ACT IN AERS
8.0000 | INHALATION_SPRAY | RESPIRATORY_TRACT | Status: AC
  Administered 2020-03-21: 8 via RESPIRATORY_TRACT
  Filled 2020-03-21: qty 6.7

## 2020-03-21 MED ORDER — DOXYCYCLINE HYCLATE 100 MG PO CAPS
100.0000 mg | ORAL_CAPSULE | Freq: Two times a day (BID) | ORAL | 0 refills | Status: DC
Start: 2020-03-21 — End: 2020-04-16

## 2020-03-21 MED ORDER — PREDNISONE 20 MG PO TABS
40.0000 mg | ORAL_TABLET | Freq: Every day | ORAL | 0 refills | Status: DC
Start: 2020-03-21 — End: 2020-04-22

## 2020-03-21 NOTE — ED Triage Notes (Signed)
[facility] - pt c/o SOB since 0300 today. Pt wears 2.5LPM at home

## 2020-03-21 NOTE — Discharge Instructions (Addendum)
Pick up your prescriptions today and start them immediately.  Use your albuterol nebulizer every 2-4 hours for today and tomorrow, then as needed.

## 2020-03-21 NOTE — ED Provider Notes (Signed)
[facility] EMERGENCY DEPARTMENT Provider Note   CSN: [id number] Arrival date & time: 03/21/20  5885     History Chief Complaint  Patient presents with  . Shortness of Breath    Bryan [name] is a 64 y.o. male.  Patient with history of COPD, chronic oxygen dependence presents to the emergency department for evaluation of shortness of breath.  Patient reports that around 3 AM he woke up and felt like he could not get a good breath.  He has not had any cough or chest pain associated with the symptoms.  Patient did not use any bronchodilator therapy before calling an ambulance.        Past Medical History:  Diagnosis Date  . Acid reflux   . Arthritis   . Asthma   . Chronic pain    with leg and back pain (disc problem)  . COPD (chronic obstructive pulmonary disease) ([place])   . Diabetes mellitus without complication ([place])   . Headache    HX OF  . History of upper GI x-ray series    to follow showed large duodenal ulcer H pylori serologies were negative  . Hypertension   . NSTEMI (non-ST elevated myocardial infarction) ([place]) 11/2019  . Pneumonia    07/17/17  . Pre-diabetes   . Stroke [facility])    TIA MINI STROKE  . Tubular adenoma     Patient Active Problem List   Diagnosis Date Noted  . Acute on chronic respiratory failure with hypoxia ([place]) 02/14/2020  . Diverticulitis of colon   . Abnormal CT scan, colon   . RUQ pain   . Acute on chronic diastolic CHF (congestive heart failure) ([place]) 02/12/2020  . PUD (peptic ulcer disease) 01/28/2020  . Nonobstructive atherosclerosis of coronary artery   . Pulmonary hypertension due to COPD ([place])   . NSTEMI (non-ST elevated myocardial infarction) ([place]) 11/25/2019  . Non-insulin dependent type 2 diabetes mellitus ([place])   . Mixed hyperlipidemia   . Non-ST elevation (NSTEMI) myocardial infarction ([place]) 11/24/2019  . Oxygen dependent 11/24/2019  . Former smoker 11/24/2019  . Coronary artery calcification seen on CAT scan   . Angina  pectoris ([place]) 11/23/2019  . SOB (shortness of breath) 11/23/2019  . Chronic diastolic CHF (congestive heart failure) ([place]) 08/03/2019  . Acute on chronic respiratory failure ([place]) 08/02/2019  . Acute exacerbation of CHF (congestive heart failure) /HFpEF (EF 60%) 06/13/2019  . Normocytic anemia 02/15/2019  . COPD exacerbation ([place]) 09/16/2018  . Hypokalemia 06/26/2018  . Hypomagnesemia 06/26/2018  . Anemia 06/26/2018  . BPH (benign prostatic hyperplasia) 06/26/2018  . Chronic respiratory failure with hypoxia ([place]) 06/04/2018  . HCAP (healthcare-associated pneumonia) 06/04/2018  . Acute respiratory failure with hypoxia ([place]) 04/14/2018  . Pulmonary nodule 04/14/2018  . CKD (chronic kidney disease) stage 3, GFR 30-59 ml/min 04/14/2018  . Type 2 diabetes mellitus with hyperlipidemia ([place]) 03/12/2018  . BPH with obstruction/lower urinary tract symptoms 08/17/2017  . Left sided numbness 07/22/2017  . TIA (transient ischemic attack) 07/22/2017  . Pneumonia 07/14/2017  . COPD with acute exacerbation ([place]) 07/14/2017  . Constipation 07/10/2017  . Hx of adenomatous colonic polyps 05/01/2017  . Benign hypertension with CKD (chronic kidney disease) stage III   . Chronic pain   . Heme positive stool 10/20/2014  . Epigastric pain 06/19/2014  . Dysphagia 06/19/2014  . AP (abdominal pain) 11/13/2013  . Early satiety 11/13/2013  . COPD (chronic obstructive pulmonary disease) ([place]) 02/22/2013  . Low back pain 11/19/2012  . COLITIS  05/12/2009  . ABDOMINAL PAIN, LEFT LOWER QUADRANT 05/12/2009  . DUODENAL ULCER, HX OF 05/12/2009  . H/o Alcohol abuse- Quit in 2010 05/11/2009  . GERD 05/11/2009  . Diarrhea 05/11/2009  . SPONDYLOSIS, CERVICAL 07/03/2007  . NECK PAIN 07/03/2007    Past Surgical History:  Procedure Laterality Date  . BACK SURGERY  7/05; 5/09    Dr.Hirsch,3 lumbar  X3  . BACK SURGERY    . COLONOSCOPY  Feb 2012   Dr. Gala Romney: normal rectum, pedunculate polyp removed but not  recovered  . COLONOSCOPY WITH ESOPHAGOGASTRODUODENOSCOPY (EGD) N/A 11/18/2013   Dr.Rourk- tcs= normal rectum, multipe polyps about the ileocecal valve and distal transverse colon o/w the remainder of the colonic mucosa appeared normal bx= tubular adenoma. EGD= normal esophagus, stomach with scattered erosions mottling, friablility, no ulcer or infiltrating process patent pylorus bx= chronic inflammation. next TCS 11/2016  . COLONOSCOPY WITH PROPOFOL N/A 05/25/2017   Dr. Gala Romney: sigmoid diverticulosis, one 4 mm hyerplastic rectal polyp, ascending colonic AVMs surveillance 2023  . ESOPHAGOGASTRODUODENOSCOPY  1/06   Dr. Volney American esophageal erosions,U-shaped stomach,marked erosions and edema of the bulb without discrete ulcer disease.   . ESOPHAGOGASTRODUODENOSCOPY (EGD) WITH PROPOFOL N/A 05/25/2017   Dr. Gala Romney: reflux esophagitis s/p empiric dilation, normal stomach and duodenum  . ESOPHAGOGASTRODUODENOSCOPY (EGD) WITH PROPOFOL Left 11/30/2019   LA Grade D esophagitis, small hiatal hernia, one non-bleeding cratered gastric ulcer in prepyloric region of stomach, many non-bleeding cratered duodenal ulcers without stigmata of bleeding, mild luminal narrowing at apex of duodenal bulb but traversed easily. H.pylori serology negative.  Marland Kitchen MALONEY DILATION N/A 05/25/2017   Procedure: Venia Minks DILATION;  Surgeon: Daneil Dolin, MD;  Location: [facility] ENDO SUITE;  Service: Endoscopy;  Laterality: N/A;  . NECK SURGERY  10/2007   PLATE IN NECK  . POLYPECTOMY  05/25/2017   Procedure: POLYPECTOMY;  Surgeon: Daneil Dolin, MD;  Location: [facility] ENDO SUITE;  Service: Endoscopy;;  rectal  . RIGHT/LEFT HEART CATH AND CORONARY ANGIOGRAPHY N/A 12/03/2019   Procedure: RIGHT/LEFT HEART CATH AND CORONARY ANGIOGRAPHY;  Surgeon: Nigel Mormon, MD;  Location: [facility] CV LAB;  Service: Cardiovascular;  Laterality: N/A;  . TRANSURETHRAL RESECTION OF PROSTATE N/A 08/17/2017   Procedure: TRANSURETHRAL RESECTION OF THE PROSTATE  (TURP);  Surgeon: Irine Seal, MD;  Location: [facility] ORS;  Service: Urology;  Laterality: N/A;       Family History  Problem Relation Age of Onset  . Cancer Father   . Asthma Mother   . Colon cancer Neg Hx     Social History   Tobacco Use  . Smoking status: Former Smoker    Packs/day: 0.50    Years: 40.00    Pack years: 20.00    Types: Cigarettes    Quit date: 11/11/2014    Years since quitting: 5.3  . Smokeless tobacco: Never Used  Vaping Use  . Vaping Use: Never used  Substance Use Topics  . Alcohol use: No    Alcohol/week: 0.0 standard drinks  . Drug use: Not Currently    Types: Marijuana    Comment: occas; denied 01/28/20    Home Medications Prior to Admission medications   Medication Sig Start Date End Date Taking? Authorizing Provider  acetaminophen (TYLENOL) 500 MG tablet Take 500 mg by mouth every 6 (six) hours as needed (pain.).     [provider]  albuterol (PROVENTIL) (2.5 MG/3ML) 0.083% nebulizer solution Take 3 mLs (2.5 mg total) by nebulization every 4 (four) hours as needed for wheezing  or shortness of breath. 06/14/19   Roxan Hockey, MD  albuterol (VENTOLIN HFA) 108 (90 Base) MCG/ACT inhaler Inhale 2 puffs into the lungs every 4 (four) hours as needed for wheezing or shortness of breath. Do not use with nebulizer 06/14/19   Roxan Hockey, MD  calcium carbonate (TUMS - DOSED IN MG ELEMENTAL CALCIUM) 500 MG chewable tablet Chew 2 tablets (400 mg of elemental calcium total) by mouth 3 (three) times daily as needed for indigestion or heartburn. 12/04/19   Amin, Jeanella Flattery, MD  doxycycline (VIBRAMYCIN) 100 MG capsule Take 1 capsule (100 mg total) by mouth 2 (two) times daily. 03/21/20   Orpah Greek, MD  Fluticasone-Umeclidin-Vilant (TRELEGY ELLIPTA) 100-62.5-25 MCG/INH AEPB Inhale 1 puff into the lungs daily. 06/14/19   Roxan Hockey, MD  furosemide (LASIX) 40 MG tablet Take 1 tablet (40 mg total) by mouth daily. 02/15/20   Orson Eva, MD    gabapentin (NEURONTIN) 300 MG capsule Take 1 capsule (300 mg total) by mouth 2 (two) times daily. Pt. Says he is taking twice daily. Patient taking differently: Take 600 mg by mouth in the morning, at noon, and at bedtime.  06/14/19   Roxan Hockey, MD  guaiFENesin (MUCINEX) 600 MG 12 hr tablet Take 1 tablet (600 mg total) by mouth 2 (two) times daily. Patient taking differently: Take 600 mg by mouth daily.  06/14/19 06/13/20  Roxan Hockey, MD  linaclotide (LINZESS) 72 MCG capsule Take 1 capsule (72 mcg total) by mouth daily before breakfast. Patient taking differently: Take 72 mcg by mouth daily as needed (constipation.).  06/14/19   Roxan Hockey, MD  magnesium oxide (MAG-OX) 400 MG tablet Take 400 mg by mouth daily.    [provider]  metoprolol tartrate (LOPRESSOR) 25 MG tablet Take 0.5 tablets (12.5 mg total) by mouth 2 (two) times daily. Patient not taking: Reported on 03/20/2020 12/04/19 03/20/21  Damita Lack, MD  OXYGEN Inhale 2.5 L into the lungs daily.    [provider]  pantoprazole (PROTONIX) 40 MG tablet Take 1 tablet (40 mg total) by mouth 2 (two) times daily before a meal. Patient taking differently: Take 40 mg by mouth in the morning and at bedtime.  12/04/19 03/20/21  Amin, Jeanella Flattery, MD  predniSONE (DELTASONE) 20 MG tablet Take 2 tablets (40 mg total) by mouth daily with breakfast. 03/21/20   Sheritta Deeg, Gwenyth Allegra, MD  SUPREP BOWEL PREP KIT 17.5-3.13-1.6 GM/177ML SOLN Take 354 mLs by mouth once. 03/17/20   [provider]    Allergies    Patient has no known allergies.  Review of Systems   Review of Systems  Respiratory: Positive for shortness of breath.   All other systems reviewed and are negative.   Physical Exam Updated Vital Signs BP (!) 160/90   Pulse 66   Temp 98 F (36.7 C)   Resp 18   Ht _0  (1.6 m)   Wt 54.9 kg   SpO2 95%   BMI 21.44 kg/m   Physical Exam Vitals and nursing note reviewed.  Constitutional:       General: He is not in acute distress.    Appearance: Normal appearance. He is well-developed.  HENT:     Head: Normocephalic and atraumatic.     Right Ear: Hearing normal.     Left Ear: Hearing normal.     Nose: Nose normal.  Eyes:     Conjunctiva/sclera: Conjunctivae normal.     Pupils: Pupils are equal, round, and reactive  to light.  Cardiovascular:     Rate and Rhythm: Regular rhythm.     Heart sounds: S1 normal and S2 normal. No murmur heard.  No friction rub. No gallop.   Pulmonary:     Effort: Pulmonary effort is normal. No respiratory distress.     Breath sounds: Decreased breath sounds and wheezing present.  Chest:     Chest wall: No tenderness.  Abdominal:     General: Bowel sounds are normal.     Palpations: Abdomen is soft.     Tenderness: There is no abdominal tenderness. There is no guarding or rebound. Negative signs include Murphy's sign and McBurney's sign.     Hernia: No hernia is present.  Musculoskeletal:        General: Normal range of motion.     Cervical back: Normal range of motion and neck supple.  Skin:    General: Skin is warm and dry.     Findings: No rash.  Neurological:     Mental Status: He is alert and oriented to person, place, and time.     GCS: GCS eye subscore is 4. GCS verbal subscore is 5. GCS motor subscore is 6.     Cranial Nerves: No cranial nerve deficit.     Sensory: No sensory deficit.     Coordination: Coordination normal.  Psychiatric:        Speech: Speech normal.        Behavior: Behavior normal.        Thought Content: Thought content normal.     ED Results / Procedures / Treatments   Labs (all labs ordered are listed, but only abnormal results are displayed) Labs Reviewed  CBC - Abnormal; Notable for the following components:      Result Value   Hemoglobin 11.4 (*)    MCH 24.4 (*)    MCHC 29.2 (*)    RDW 17.2 (*)    All other components within normal limits  BASIC METABOLIC PANEL - Abnormal; Notable for the  following components:   BUN 29 (*)    Creatinine, Ser 1.83 (*)    GFR calc non Af Amer 38 (*)    GFR calc Af Amer 45 (*)    All other components within normal limits  BRAIN NATRIURETIC PEPTIDE - Abnormal; Notable for the following components:   B Natriuretic Peptide 424.0 (*)    All other components within normal limits  TROPONIN I (HIGH SENSITIVITY)    EKG EKG Interpretation  Date/Time:  Saturday March 21 2020 06:34:47 EDT Ventricular Rate:  64 PR Interval:    QRS Duration: 92 QT Interval:  441 QTC Calculation: 455 R Axis:   91 Text Interpretation: Sinus rhythm Borderline short PR interval Right axis deviation Minimal ST elevation, inferior leads No significant change since last tracing Confirmed by Orpah Greek ([id number]) on 03/21/2020 6:37:58 AM   Radiology DG Chest Port 1 View  Result Date: 03/21/2020 CLINICAL DATA:  Shortness of breath starting this morning. EXAM: PORTABLE CHEST 1 VIEW COMPARISON:  Chest x-rays dated 02/12/2020 and 02/02/2020. FINDINGS: Lungs are hyperexpanded. Chronic bronchitic changes noted centrally. No evidence of superimposed pneumonia or pulmonary edema. No pleural effusion or pneumothorax is seen. Heart size and mediastinal contours are stable. Osseous structures about the chest are unremarkable. IMPRESSION: 1. No active disease. No evidence of pneumonia or pulmonary edema. 2. Hyperexpanded lungs indicating COPD. Electronically Signed   By: Franki Cabot M.D.   On: 03/21/2020 07:13    Procedures  Procedures (including critical care time)  Medications Ordered in ED Medications  albuterol (VENTOLIN HFA) 108 (90 Base) MCG/ACT inhaler 8 puff (8 puffs Inhalation Given 03/21/20 0640)    ED Course  I have reviewed the triage vital signs and the nursing notes.  Pertinent labs & imaging results that were available during my care of the patient were reviewed by me and considered in my medical decision making (see chart for details).    MDM  Rules/Calculators/A&P                          Patient presents to the emergency department for evaluation of shortness of breath.  Patient has COPD and is oxygen dependent.  He is not requiring any increased oxygen currently.  He does have diminished breath sounds with some wheezing bilaterally.  This has improved with albuterol treatment.  He did not try any albuterol before calling the ambulance.  Chest x-ray does not show any evidence of pneumonia or any acute pathology.  Remainder of work-up including cardiac work-up were unremarkable.  Patient will be discharged, encouraged to use his bronchodilators.  We will add prednisone burst and doxycycline.  Return if symptoms worsen.  Final Clinical Impression(s) / ED Diagnoses Final diagnoses:  COPD exacerbation ([place])    Rx / DC Orders ED Discharge Orders         Ordered    predniSONE (DELTASONE) 20 MG tablet  Daily with breakfast     Discontinue  Reprint     03/21/20 0725    doxycycline (VIBRAMYCIN) 100 MG capsule  2 times daily     Discontinue  Reprint     03/21/20 0725           Orpah Greek, MD 03/21/20 [phone]

## 2020-03-22 NOTE — ED Provider Notes (Signed)
[facility] EMERGENCY DEPARTMENT Provider Note   CSN: [id number] Arrival date & time: 03/20/20  3976     History Chief Complaint  Patient presents with  . Abdominal Pain  . Chest Pain    Bryan HUSMANN is a 64 y.o. male.  HPI   63yM with abdominal pain.  Epigastric.  Has been going on for least weeks.  He has been evaluated as an outpatient for the same.  Upcoming appointment with gastroenterology.  Pain is been worse in the past day or so.  Radiates up into his chest.  Some mild nausea.  No vomiting.  No change in bowel movements.  Past Medical History:  Diagnosis Date  . Acid reflux   . Arthritis   . Asthma   . Chronic pain    with leg and back pain (disc problem)  . COPD (chronic obstructive pulmonary disease) ([place])   . Diabetes mellitus without complication ([place])   . Headache    HX OF  . History of upper GI x-ray series    to follow showed large duodenal ulcer H pylori serologies were negative  . Hypertension   . NSTEMI (non-ST elevated myocardial infarction) ([place]) 11/2019  . Pneumonia    07/17/17  . Pre-diabetes   . Stroke [facility])    TIA MINI STROKE  . Tubular adenoma     Patient Active Problem List   Diagnosis Date Noted  . Acute on chronic respiratory failure with hypoxia ([place]) 02/14/2020  . Diverticulitis of colon   . Abnormal CT scan, colon   . RUQ pain   . Acute on chronic diastolic CHF (congestive heart failure) ([place]) 02/12/2020  . PUD (peptic ulcer disease) 01/28/2020  . Nonobstructive atherosclerosis of coronary artery   . Pulmonary hypertension due to COPD ([place])   . NSTEMI (non-ST elevated myocardial infarction) ([place]) 11/25/2019  . Non-insulin dependent type 2 diabetes mellitus ([place])   . Mixed hyperlipidemia   . Non-ST elevation (NSTEMI) myocardial infarction ([place]) 11/24/2019  . Oxygen dependent 11/24/2019  . Former smoker 11/24/2019  . Coronary artery calcification seen on CAT scan   . Angina pectoris ([place]) 11/23/2019  . SOB (shortness of breath)  11/23/2019  . Chronic diastolic CHF (congestive heart failure) ([place]) 08/03/2019  . Acute on chronic respiratory failure ([place]) 08/02/2019  . Acute exacerbation of CHF (congestive heart failure) /HFpEF (EF 60%) 06/13/2019  . Normocytic anemia 02/15/2019  . COPD exacerbation ([place]) 09/16/2018  . Hypokalemia 06/26/2018  . Hypomagnesemia 06/26/2018  . Anemia 06/26/2018  . BPH (benign prostatic hyperplasia) 06/26/2018  . Chronic respiratory failure with hypoxia ([place]) 06/04/2018  . HCAP (healthcare-associated pneumonia) 06/04/2018  . Acute respiratory failure with hypoxia ([place]) 04/14/2018  . Pulmonary nodule 04/14/2018  . CKD (chronic kidney disease) stage 3, GFR 30-59 ml/min 04/14/2018  . Type 2 diabetes mellitus with hyperlipidemia ([place]) 03/12/2018  . BPH with obstruction/lower urinary tract symptoms 08/17/2017  . Left sided numbness 07/22/2017  . TIA (transient ischemic attack) 07/22/2017  . Pneumonia 07/14/2017  . COPD with acute exacerbation ([place]) 07/14/2017  . Constipation 07/10/2017  . Hx of adenomatous colonic polyps 05/01/2017  . Benign hypertension with CKD (chronic kidney disease) stage III   . Chronic pain   . Heme positive stool 10/20/2014  . Epigastric pain 06/19/2014  . Dysphagia 06/19/2014  . AP (abdominal pain) 11/13/2013  . Early satiety 11/13/2013  . COPD (chronic obstructive pulmonary disease) ([place]) 02/22/2013  . Low back pain 11/19/2012  . COLITIS 05/12/2009  . ABDOMINAL PAIN,  LEFT LOWER QUADRANT 05/12/2009  . DUODENAL ULCER, HX OF 05/12/2009  . H/o Alcohol abuse- Quit in 2010 05/11/2009  . GERD 05/11/2009  . Diarrhea 05/11/2009  . SPONDYLOSIS, CERVICAL 07/03/2007  . NECK PAIN 07/03/2007    Past Surgical History:  Procedure Laterality Date  . BACK SURGERY  7/05; 5/09    Dr.Hirsch,3 lumbar  X3  . BACK SURGERY    . COLONOSCOPY  Feb 2012   Dr. Gala Romney: normal rectum, pedunculate polyp removed but not recovered  . COLONOSCOPY WITH ESOPHAGOGASTRODUODENOSCOPY  (EGD) N/A 11/18/2013   Dr.Rourk- tcs= normal rectum, multipe polyps about the ileocecal valve and distal transverse colon o/w the remainder of the colonic mucosa appeared normal bx= tubular adenoma. EGD= normal esophagus, stomach with scattered erosions mottling, friablility, no ulcer or infiltrating process patent pylorus bx= chronic inflammation. next TCS 11/2016  . COLONOSCOPY WITH PROPOFOL N/A 05/25/2017   Dr. Gala Romney: sigmoid diverticulosis, one 4 mm hyerplastic rectal polyp, ascending colonic AVMs surveillance 2023  . ESOPHAGOGASTRODUODENOSCOPY  1/06   Dr. Volney American esophageal erosions,U-shaped stomach,marked erosions and edema of the bulb without discrete ulcer disease.   . ESOPHAGOGASTRODUODENOSCOPY (EGD) WITH PROPOFOL N/A 05/25/2017   Dr. Gala Romney: reflux esophagitis s/p empiric dilation, normal stomach and duodenum  . ESOPHAGOGASTRODUODENOSCOPY (EGD) WITH PROPOFOL Left 11/30/2019   LA Grade D esophagitis, small hiatal hernia, one non-bleeding cratered gastric ulcer in prepyloric region of stomach, many non-bleeding cratered duodenal ulcers without stigmata of bleeding, mild luminal narrowing at apex of duodenal bulb but traversed easily. H.pylori serology negative.  Marland Kitchen MALONEY DILATION N/A 05/25/2017   Procedure: Venia Minks DILATION;  Surgeon: Daneil Dolin, MD;  Location: [facility] ENDO SUITE;  Service: Endoscopy;  Laterality: N/A;  . NECK SURGERY  10/2007   PLATE IN NECK  . POLYPECTOMY  05/25/2017   Procedure: POLYPECTOMY;  Surgeon: Daneil Dolin, MD;  Location: [facility] ENDO SUITE;  Service: Endoscopy;;  rectal  . RIGHT/LEFT HEART CATH AND CORONARY ANGIOGRAPHY N/A 12/03/2019   Procedure: RIGHT/LEFT HEART CATH AND CORONARY ANGIOGRAPHY;  Surgeon: Nigel Mormon, MD;  Location: [place] CV LAB;  Service: Cardiovascular;  Laterality: N/A;  . TRANSURETHRAL RESECTION OF PROSTATE N/A 08/17/2017   Procedure: TRANSURETHRAL RESECTION OF THE PROSTATE (TURP);  Surgeon: Irine Seal, MD;  Location: [facility] ORS;   Service: Urology;  Laterality: N/A;       Family History  Problem Relation Age of Onset  . Cancer Father   . Asthma Mother   . Colon cancer Neg Hx     Social History   Tobacco Use  . Smoking status: Former Smoker    Packs/day: 0.50    Years: 40.00    Pack years: 20.00    Types: Cigarettes    Quit date: 11/11/2014    Years since quitting: 5.3  . Smokeless tobacco: Never Used  Vaping Use  . Vaping Use: Never used  Substance Use Topics  . Alcohol use: No    Alcohol/week: 0.0 standard drinks  . Drug use: Not Currently    Types: Marijuana    Comment: occas; denied 01/28/20    Home Medications Prior to Admission medications   Medication Sig Start Date End Date Taking? Authorizing Provider  acetaminophen (TYLENOL) 500 MG tablet Take 500 mg by mouth every 6 (six) hours as needed (pain.).    Yes [provider]  albuterol (PROVENTIL) (2.5 MG/3ML) 0.083% nebulizer solution Take 3 mLs (2.5 mg total) by nebulization every 4 (four) hours as needed for wheezing or shortness of breath. 06/14/19  Yes Emokpae, Courage, MD  albuterol (VENTOLIN HFA) 108 (90 Base) MCG/ACT inhaler Inhale 2 puffs into the lungs every 4 (four) hours as needed for wheezing or shortness of breath. Do not use with nebulizer 06/14/19  Yes Emokpae, Courage, MD  calcium carbonate (TUMS - DOSED IN MG ELEMENTAL CALCIUM) 500 MG chewable tablet Chew 2 tablets (400 mg of elemental calcium total) by mouth 3 (three) times daily as needed for indigestion or heartburn. 12/04/19  Yes Amin, Ankit Chirag, MD  Fluticasone-Umeclidin-Vilant (TRELEGY ELLIPTA) 100-62.5-25 MCG/INH AEPB Inhale 1 puff into the lungs daily. 06/14/19  Yes Emokpae, Courage, MD  furosemide (LASIX) 40 MG tablet Take 1 tablet (40 mg total) by mouth daily. 02/15/20  Yes Tat, Shanon Brow, MD  gabapentin (NEURONTIN) 300 MG capsule Take 1 capsule (300 mg total) by mouth 2 (two) times daily. Pt. Says he is taking twice daily. Patient taking differently: Take 600 mg by  mouth in the morning, at noon, and at bedtime.  06/14/19  Yes Emokpae, Courage, MD  guaiFENesin (MUCINEX) 600 MG 12 hr tablet Take 1 tablet (600 mg total) by mouth 2 (two) times daily. Patient taking differently: Take 600 mg by mouth daily.  06/14/19 06/13/20 Yes Roxan Hockey, MD  linaclotide (LINZESS) 72 MCG capsule Take 1 capsule (72 mcg total) by mouth daily before breakfast. Patient taking differently: Take 72 mcg by mouth daily as needed (constipation.).  06/14/19  Yes Emokpae, Courage, MD  magnesium oxide (MAG-OX) 400 MG tablet Take 400 mg by mouth daily.   Yes [provider]  OXYGEN Inhale 2.5 L into the lungs daily.   Yes [provider]  pantoprazole (PROTONIX) 40 MG tablet Take 1 tablet (40 mg total) by mouth 2 (two) times daily before a meal. Patient taking differently: Take 40 mg by mouth in the morning and at bedtime.  12/04/19 03/20/21 Yes Amin, Ankit Chirag, MD  SUPREP BOWEL PREP KIT 17.5-3.13-1.6 GM/177ML SOLN Take 354 mLs by mouth once. 03/17/20  Yes [provider]  doxycycline (VIBRAMYCIN) 100 MG capsule Take 1 capsule (100 mg total) by mouth 2 (two) times daily. 03/21/20   Orpah Greek, MD  metoprolol tartrate (LOPRESSOR) 25 MG tablet Take 0.5 tablets (12.5 mg total) by mouth 2 (two) times daily. Patient not taking: Reported on 03/20/2020 12/04/19 03/20/21  Damita Lack, MD  predniSONE (DELTASONE) 20 MG tablet Take 2 tablets (40 mg total) by mouth daily with breakfast. 03/21/20   Pollina, Gwenyth Allegra, MD    Allergies    Patient has no known allergies.  Review of Systems   Review of Systems All systems reviewed and negative, other than as noted in HPI.  Physical Exam Updated Vital Signs BP (!) 136/93   Pulse 67   Temp 98.4 F (36.9 C) (Oral)   Resp (!) 28   Ht 5' 3" (1.6 m)   Wt 54.9 kg   SpO2 94%   BMI 21.43 kg/m   Physical Exam Vitals and nursing note reviewed.  Constitutional:      General: He is not in acute distress.     Appearance: He is well-developed.  HENT:     Head: Normocephalic and atraumatic.  Eyes:     General:        Right eye: No discharge.        Left eye: No discharge.     Conjunctiva/sclera: Conjunctivae normal.  Cardiovascular:     Rate and Rhythm: Normal rate and regular rhythm.     Heart sounds:  Normal heart sounds. No murmur heard.  No friction rub. No gallop.   Pulmonary:     Effort: Pulmonary effort is normal. No respiratory distress.     Breath sounds: Normal breath sounds.  Abdominal:     General: There is no distension.     Palpations: Abdomen is soft.     Tenderness: There is abdominal tenderness.     Comments: Epigastric tenderness without rebound or guarding.  No distention.  Musculoskeletal:        General: No tenderness.     Cervical back: Neck supple.  Skin:    General: Skin is warm and dry.  Neurological:     Mental Status: He is alert.  Psychiatric:        Behavior: Behavior normal.        Thought Content: Thought content normal.     ED Results / Procedures / Treatments   Labs (all labs ordered are listed, but only abnormal results are displayed) Labs Reviewed  COMPREHENSIVE METABOLIC PANEL - Abnormal; Notable for the following components:      Result Value   Potassium 5.2 (*)    Glucose, Bld 162 (*)    Creatinine, Ser 1.54 (*)    Calcium 8.7 (*)    Albumin 3.4 (*)    GFR calc non Af Amer 47 (*)    GFR calc Af Amer 55 (*)    All other components within normal limits  CBC WITH DIFFERENTIAL/PLATELET - Abnormal; Notable for the following components:   Hemoglobin 12.1 (*)    MCH 24.8 (*)    MCHC 28.9 (*)    RDW 17.7 (*)    Neutro Abs 8.0 (*)    All other components within normal limits  LIPASE, BLOOD    EKG EKG Interpretation  Date/Time:  Friday March 20 2020 10:39:46 EDT Ventricular Rate:  89 PR Interval:    QRS Duration: 90 QT Interval:  379 QTC Calculation: 462 R Axis:   93 Text Interpretation: Sinus rhythm Short PR interval Right axis  deviation Probable anteroseptal infarct, old Confirmed by Virgel Manifold [phone]) on 03/20/2020 10:47:27 AM   Radiology DG Chest Port 1 View  Result Date: 03/21/2020 CLINICAL DATA:  Shortness of breath starting this morning. EXAM: PORTABLE CHEST 1 VIEW COMPARISON:  Chest x-rays dated 02/12/2020 and 02/02/2020. FINDINGS: Lungs are hyperexpanded. Chronic bronchitic changes noted centrally. No evidence of superimposed pneumonia or pulmonary edema. No pleural effusion or pneumothorax is seen. Heart size and mediastinal contours are stable. Osseous structures about the chest are unremarkable. IMPRESSION: 1. No active disease. No evidence of pneumonia or pulmonary edema. 2. Hyperexpanded lungs indicating COPD. Electronically Signed   By: Franki Cabot M.D.   On: 03/21/2020 07:13    Procedures Procedures (including critical care time)  Medications Ordered in ED Medications  famotidine (PEPCID) IVPB 20 mg premix (0 mg Intravenous Stopped 03/20/20 1215)  alum & mag hydroxide-simeth (MAALOX/MYLANTA) 200-200-20 MG/5ML suspension 30 mL (30 mLs Oral Given 03/20/20 1144)    And  lidocaine (XYLOCAINE) 2 % viscous mouth solution 15 mL (15 mLs Oral Given 03/20/20 1144)  morphine 4 MG/ML injection 4 mg (4 mg Intravenous Given 03/20/20 1144)    ED Course  I have reviewed the triage vital signs and the nursing notes.  Pertinent labs & imaging results that were available during my care of the patient were reviewed by me and considered in my medical decision making (see chart for details).    MDM Rules/Calculators/A&P  64 year old male with upper abdominal pain.  This seems more subacute to chronic.  Some epigastric tenderness on exam.  No peritonitis.  Work-up fairly unrevealing.  Possibly PUD.  Significant improvement of symptoms with a GI cocktail.  Doubt ACS.  He has established GI follow-up.  Return precautions discussed.  Final Clinical Impression(s) / ED Diagnoses Final diagnoses:    Epigastric pain    Rx / DC Orders ED Discharge Orders    None       Virgel Manifold, MD 03/22/20 [phone]

## 2020-03-27 ENCOUNTER — Encounter (HOSPITAL_COMMUNITY): Payer: Self-pay | Admitting: Emergency Medicine

## 2020-03-27 ENCOUNTER — Emergency Department (HOSPITAL_COMMUNITY): Payer: Medicare Other

## 2020-03-27 ENCOUNTER — Emergency Department (HOSPITAL_COMMUNITY)
Admission: EM | Admit: 2020-03-27 | Discharge: 2020-03-27 | Disposition: A | Discharge status: Home / Self Care | Payer: Medicare Other | Attending: Emergency Medicine | Admitting: Emergency Medicine

## 2020-03-27 ENCOUNTER — Other Ambulatory Visit: Payer: Self-pay

## 2020-03-27 DIAGNOSIS — J449 Chronic obstructive pulmonary disease, unspecified: Secondary | ICD-10-CM | POA: Insufficient documentation

## 2020-03-27 DIAGNOSIS — N183 Chronic kidney disease, stage 3 unspecified: Secondary | ICD-10-CM | POA: Diagnosis not present

## 2020-03-27 DIAGNOSIS — E119 Type 2 diabetes mellitus without complications: Secondary | ICD-10-CM | POA: Diagnosis not present

## 2020-03-27 DIAGNOSIS — Z7951 Long term (current) use of inhaled steroids: Secondary | ICD-10-CM | POA: Diagnosis not present

## 2020-03-27 DIAGNOSIS — I130 Hypertensive heart and chronic kidney disease with heart failure and stage 1 through stage 4 chronic kidney disease, or unspecified chronic kidney disease: Secondary | ICD-10-CM | POA: Insufficient documentation

## 2020-03-27 DIAGNOSIS — R0789 Other chest pain: Secondary | ICD-10-CM | POA: Diagnosis present

## 2020-03-27 DIAGNOSIS — Z87891 Personal history of nicotine dependence: Secondary | ICD-10-CM | POA: Diagnosis not present

## 2020-03-27 DIAGNOSIS — J45909 Unspecified asthma, uncomplicated: Secondary | ICD-10-CM | POA: Diagnosis not present

## 2020-03-27 DIAGNOSIS — R079 Chest pain, unspecified: Secondary | ICD-10-CM

## 2020-03-27 DIAGNOSIS — I5033 Acute on chronic diastolic (congestive) heart failure: Secondary | ICD-10-CM | POA: Diagnosis not present

## 2020-03-27 LAB — BASIC METABOLIC PANEL
Anion gap: 12 (ref 5–15)
BUN: 34 mg/dL — ABNORMAL HIGH (ref 8–23)
CO2: 25 mmol/L (ref 22–32)
Calcium: 8.3 mg/dL — ABNORMAL LOW (ref 8.9–10.3)
Chloride: 103 mmol/L (ref 98–111)
Creatinine, Ser: 1.97 mg/dL — ABNORMAL HIGH (ref 0.61–1.24)
GFR calc Af Amer: 41 mL/min — ABNORMAL LOW (ref >60)
GFR calc non Af Amer: 35 mL/min — ABNORMAL LOW (ref >60)
Glucose, Bld: 135 mg/dL — ABNORMAL HIGH (ref 70–99)
Potassium: 4.3 mmol/L (ref 3.5–5.1)
Sodium: 140 mmol/L (ref 135–145)

## 2020-03-27 LAB — TROPONIN I (HIGH SENSITIVITY)
Troponin I (High Sensitivity): 13 ng/L (ref <18)
Troponin I (High Sensitivity): 12 ng/L (ref <18)

## 2020-03-27 LAB — CBC
HCT: 41 % (ref 39.0–52.0)
Hemoglobin: 12.1 g/dL — ABNORMAL LOW (ref 13.0–17.0)
MCH: 23.9 pg — ABNORMAL LOW (ref 26.0–34.0)
MCHC: 29.5 g/dL — ABNORMAL LOW (ref 30.0–36.0)
MCV: 80.9 fL (ref 80.0–100.0)
Platelets: 333 10*3/uL (ref 150–400)
RBC: 5.07 MIL/uL (ref 4.22–5.81)
RDW: 17.3 % — ABNORMAL HIGH (ref 11.5–15.5)
WBC: 11.3 10*3/uL — ABNORMAL HIGH (ref 4.0–10.5)
nRBC: 1 % — ABNORMAL HIGH (ref 0.0–0.2)

## 2020-03-27 NOTE — ED Provider Notes (Signed)
[facility] EMERGENCY DEPARTMENT Provider Note   CSN: [id number] Arrival date & time: 03/27/20  1754     History Chief Complaint  Patient presents with   Chest Pain    Bryan [name] is a 64 y.o. male.  HPI Patient presents with chest pain.  Anterior chest.  Sharp.  Somewhat comes and goes.  No fevers.  No cough.  Not exertional.  Chronic oxygen.  Worse with movement.  States it feels like a pressure.  Worse with palpations.  No fevers.  No cough.  No swelling in his legs.  Pain feeling somewhat better now.    Past Medical History:  Diagnosis Date   Acid reflux    Arthritis    Asthma    Chronic pain    with leg and back pain (disc problem)   COPD (chronic obstructive pulmonary disease) ([place])    Diabetes mellitus without complication ([place])    Headache    HX OF   History of upper GI x-ray series    to follow showed large duodenal ulcer H pylori serologies were negative   Hypertension    NSTEMI (non-ST elevated myocardial infarction) ([place]) 11/2019   Pneumonia    07/17/17   Pre-diabetes    Stroke ([place])    TIA MINI STROKE   Tubular adenoma     Patient Active Problem List   Diagnosis Date Noted   Acute on chronic respiratory failure with hypoxia ([place]) 02/14/2020   Diverticulitis of colon    Abnormal CT scan, colon    RUQ pain    Acute on chronic diastolic CHF (congestive heart failure) ([place]) 02/12/2020   PUD (peptic ulcer disease) 01/28/2020   Nonobstructive atherosclerosis of coronary artery    Pulmonary hypertension due to COPD [facility])    NSTEMI (non-ST elevated myocardial infarction) ([place]) 11/25/2019   Non-insulin dependent type 2 diabetes mellitus ([place])    Mixed hyperlipidemia    Non-ST elevation (NSTEMI) myocardial infarction ([place]) 11/24/2019   Oxygen dependent 11/24/2019   Former smoker 11/24/2019   Coronary artery calcification seen on CAT scan    Angina pectoris ([place]) 11/23/2019   SOB (shortness of breath) 11/23/2019   Chronic  diastolic CHF (congestive heart failure) ([place]) 08/03/2019   Acute on chronic respiratory failure ([place]) 08/02/2019   Acute exacerbation of CHF (congestive heart failure) /HFpEF (EF 60%) 06/13/2019   Normocytic anemia 02/15/2019   COPD exacerbation ([place]) 09/16/2018   Hypokalemia 06/26/2018   Hypomagnesemia 06/26/2018   Anemia 06/26/2018   BPH (benign prostatic hyperplasia) 06/26/2018   Chronic respiratory failure with hypoxia ([place]) 06/04/2018   HCAP (healthcare-associated pneumonia) 06/04/2018   Acute respiratory failure with hypoxia ([place]) 04/14/2018   Pulmonary nodule 04/14/2018   CKD (chronic kidney disease) stage 3, GFR 30-59 ml/min 04/14/2018   Type 2 diabetes mellitus with hyperlipidemia ([place]) 03/12/2018   BPH with obstruction/lower urinary tract symptoms 08/17/2017   Left sided numbness 07/22/2017   TIA (transient ischemic attack) 07/22/2017   Pneumonia 07/14/2017   COPD with acute exacerbation ([place]) 07/14/2017   Constipation 07/10/2017   Hx of adenomatous colonic polyps 05/01/2017   Benign hypertension with CKD (chronic kidney disease) stage III    Chronic pain    Heme positive stool 10/20/2014   Epigastric pain 06/19/2014   Dysphagia 06/19/2014   AP (abdominal pain) 11/13/2013   Early satiety 11/13/2013   COPD (chronic obstructive pulmonary disease) ([place]) 02/22/2013   Low back pain 11/19/2012   COLITIS 05/12/2009   ABDOMINAL PAIN, LEFT LOWER QUADRANT 05/12/2009  DUODENAL ULCER, HX OF 05/12/2009   H/o Alcohol abuse- Quit in 2010 05/11/2009   GERD 05/11/2009   Diarrhea 05/11/2009   SPONDYLOSIS, CERVICAL 07/03/2007   NECK PAIN 07/03/2007    Past Surgical History:  Procedure Laterality Date   BACK SURGERY  7/05; 5/09    Dr.Hirsch,3 lumbar  X3   BACK SURGERY     COLONOSCOPY  Feb 2012   Dr. Gala Romney: normal rectum, pedunculate polyp removed but not recovered   COLONOSCOPY WITH ESOPHAGOGASTRODUODENOSCOPY (EGD) N/A 11/18/2013    Dr.Rourk- tcs= normal rectum, multipe polyps about the ileocecal valve and distal transverse colon o/w the remainder of the colonic mucosa appeared normal bx= tubular adenoma. EGD= normal esophagus, stomach with scattered erosions mottling, friablility, no ulcer or infiltrating process patent pylorus bx= chronic inflammation. next TCS 11/2016   COLONOSCOPY WITH PROPOFOL N/A 05/25/2017   Dr. Gala Romney: sigmoid diverticulosis, one 4 mm hyerplastic rectal polyp, ascending colonic AVMs surveillance 2023   ESOPHAGOGASTRODUODENOSCOPY  1/06   Dr. Volney American esophageal erosions,U-shaped stomach,marked erosions and edema of the bulb without discrete ulcer disease.    ESOPHAGOGASTRODUODENOSCOPY (EGD) WITH PROPOFOL N/A 05/25/2017   Dr. Gala Romney: reflux esophagitis s/p empiric dilation, normal stomach and duodenum   ESOPHAGOGASTRODUODENOSCOPY (EGD) WITH PROPOFOL Left 11/30/2019   LA Grade D esophagitis, small hiatal hernia, one non-bleeding cratered gastric ulcer in prepyloric region of stomach, many non-bleeding cratered duodenal ulcers without stigmata of bleeding, mild luminal narrowing at apex of duodenal bulb but traversed easily. H.pylori serology negative.   MALONEY DILATION N/A 05/25/2017   Procedure: Venia Minks DILATION;  Surgeon: Daneil Dolin, MD;  [facility];  Service: Endoscopy;  Laterality: N/A;   NECK SURGERY  10/2007   PLATE IN NECK   POLYPECTOMY  05/25/2017   Procedure: POLYPECTOMY;  Surgeon: Daneil Dolin, MD;  Location: [facility];  Service: Endoscopy;;  rectal   RIGHT/LEFT HEART CATH AND CORONARY ANGIOGRAPHY N/A 12/03/2019   Procedure: RIGHT/LEFT HEART CATH AND CORONARY ANGIOGRAPHY;  Surgeon: Nigel Mormon, MD;  Location: [place] CV LAB;  Service: Cardiovascular;  Laterality: N/A;   TRANSURETHRAL RESECTION OF PROSTATE N/A 08/17/2017   Procedure: TRANSURETHRAL RESECTION OF THE PROSTATE (TURP);  Surgeon: Irine Seal, MD;  Location: [facility];  Service: Urology;   Laterality: N/A;       Family History  Problem Relation Age of Onset   Cancer Father    Asthma Mother    Colon cancer Neg Hx     Social History   Tobacco Use   Smoking status: Former Smoker    Packs/day: 0.50    Years: 40.00    Pack years: 20.00    Types: Cigarettes    Quit date: 11/11/2014    Years since quitting: 5.3   Smokeless tobacco: Never Used  Vaping Use   Vaping Use: Never used  Substance Use Topics   Alcohol use: No    Alcohol/week: 0.0 standard drinks   Drug use: Not Currently    Types: Marijuana    Comment: occas; denied 01/28/20    Home Medications Prior to Admission medications   Medication Sig Start Date End Date Taking? Authorizing Provider  acetaminophen (TYLENOL) 500 MG tablet Take 500 mg by mouth every 6 (six) hours as needed (pain.).     [provider]  albuterol (PROVENTIL) (2.5 MG/3ML) 0.083% nebulizer solution Take 3 mLs (2.5 mg total) by nebulization every 4 (four) hours as needed for wheezing or shortness of breath. 06/14/19   Roxan Hockey, MD  albuterol (VENTOLIN HFA) 108 (90 Base) MCG/ACT inhaler Inhale 2 puffs into the lungs every 4 (four) hours as needed for wheezing or shortness of breath. Do not use with nebulizer 06/14/19   Roxan Hockey, MD  calcium carbonate (TUMS - DOSED IN MG ELEMENTAL CALCIUM) 500 MG chewable tablet Chew 2 tablets (400 mg of elemental calcium total) by mouth 3 (three) times daily as needed for indigestion or heartburn. 12/04/19   Amin, Jeanella Flattery, MD  doxycycline (VIBRAMYCIN) 100 MG capsule Take 1 capsule (100 mg total) by mouth 2 (two) times daily. 03/21/20   Orpah Greek, MD  Fluticasone-Umeclidin-Vilant (TRELEGY ELLIPTA) 100-62.5-25 MCG/INH AEPB Inhale 1 puff into the lungs daily. 06/14/19   Roxan Hockey, MD  furosemide (LASIX) 40 MG tablet Take 1 tablet (40 mg total) by mouth daily. 02/15/20   Orson Eva, MD  gabapentin (NEURONTIN) 300 MG capsule Take 1 capsule (300 mg total) by mouth 2  (two) times daily. Pt. Says he is taking twice daily. Patient taking differently: Take 600 mg by mouth in the morning, at noon, and at bedtime.  06/14/19   Roxan Hockey, MD  guaiFENesin (MUCINEX) 600 MG 12 hr tablet Take 1 tablet (600 mg total) by mouth 2 (two) times daily. Patient taking differently: Take 600 mg by mouth daily.  06/14/19 06/13/20  Roxan Hockey, MD  linaclotide (LINZESS) 72 MCG capsule Take 1 capsule (72 mcg total) by mouth daily before breakfast. Patient taking differently: Take 72 mcg by mouth daily as needed (constipation.).  06/14/19   Roxan Hockey, MD  magnesium oxide (MAG-OX) 400 MG tablet Take 400 mg by mouth daily.    [provider]  metoprolol tartrate (LOPRESSOR) 25 MG tablet Take 0.5 tablets (12.5 mg total) by mouth 2 (two) times daily. Patient not taking: Reported on 03/20/2020 12/04/19 03/20/21  Damita Lack, MD  OXYGEN Inhale 2.5 L into the lungs daily.    [provider]  pantoprazole (PROTONIX) 40 MG tablet Take 1 tablet (40 mg total) by mouth 2 (two) times daily before a meal. Patient taking differently: Take 40 mg by mouth in the morning and at bedtime.  12/04/19 03/20/21  Amin, Jeanella Flattery, MD  predniSONE (DELTASONE) 20 MG tablet Take 2 tablets (40 mg total) by mouth daily with breakfast. 03/21/20   Pollina, Gwenyth Allegra, MD  SUPREP BOWEL PREP KIT 17.5-3.13-1.6 GM/177ML SOLN Take 354 mLs by mouth once. 03/17/20   [provider]    Allergies    Patient has no known allergies.  Review of Systems   Review of Systems  Constitutional: Negative for appetite change.  HENT: Negative for congestion.   Respiratory: Positive for shortness of breath.   Cardiovascular: Positive for chest pain. Negative for leg swelling.  Gastrointestinal: Negative for abdominal pain.  Genitourinary: Negative for flank pain.  Musculoskeletal: Negative for back pain.  Skin: Negative for rash.  Neurological: Negative for weakness.    Psychiatric/Behavioral: Negative for confusion.    Physical Exam Updated Vital Signs Pulse 70    Temp 100 F (37.8 C) (Oral)    Resp 20    Ht _0  (1.6 m)    Wt 54 kg    SpO2 97%    BMI 21.09 kg/m   Physical Exam Vitals and nursing note reviewed.  HENT:     Head: Normocephalic.  Cardiovascular:     Rate and Rhythm: Normal rate and regular rhythm.  Pulmonary:     Breath sounds: No wheezing or rhonchi.  Chest:  Chest wall: Tenderness present.     Comments: anterior tenderness Musculoskeletal:     Right lower leg: No edema.     Left lower leg: No edema.  Skin:    General: Skin is warm.     Capillary Refill: Capillary refill takes less than 2 seconds.  Neurological:     Mental Status: He is alert and oriented to person, place, and time.     ED Results / Procedures / Treatments   Labs (all labs ordered are listed, but only abnormal results are displayed) Labs Reviewed  BASIC METABOLIC PANEL - Abnormal; Notable for the following components:      Result Value   Glucose, Bld 135 (*)    BUN 34 (*)    Creatinine, Ser 1.97 (*)    Calcium 8.3 (*)    GFR calc non Af Amer 35 (*)    GFR calc Af Amer 41 (*)    All other components within normal limits  CBC - Abnormal; Notable for the following components:   WBC 11.3 (*)    Hemoglobin 12.1 (*)    MCH 23.9 (*)    MCHC 29.5 (*)    RDW 17.3 (*)    nRBC 1.0 (*)    All other components within normal limits  TROPONIN I (HIGH SENSITIVITY)  TROPONIN I (HIGH SENSITIVITY)    EKG None  Radiology DG Chest 2 View  Result Date: 03/27/2020 CLINICAL DATA:  Chest pain. EXAM: CHEST - 2 VIEW COMPARISON:  March 21, 2020. FINDINGS: The heart size and mediastinal contours are within normal limits. Both lungs are clear. No pneumothorax or pleural effusion is noted. The visualized skeletal structures are unremarkable. IMPRESSION: No active cardiopulmonary disease. Electronically Signed   By: Marijo Conception M.D.   On: 03/27/2020 18:55     Procedures Procedures (including critical care time)  Medications Ordered in ED Medications - No data to display  ED Course  I have reviewed the triage vital signs and the nursing notes.  Pertinent labs & imaging results that were available during my care of the patient were reviewed by me and considered in my medical decision making (see chart for details).    MDM Rules/Calculators/A&P                         Patient with chest pain.  Anterior chest.  EKG stable.  Troponin negative x2.  Reviewed r results of recent cath.  Nonobstructive disease at that time.  With negative troponin feels the patient can be discharged home.  Doubt pulmonary embolism.  Discharge home. Final Clinical Impression(s) / ED Diagnoses Final diagnoses:  Nonspecific chest pain    Rx / DC Orders ED Discharge Orders    None       Davonna Belling, MD 03/27/20 2348

## 2020-03-27 NOTE — ED Triage Notes (Signed)
Pt reports chest pain, shortness of breath for last 3 hours. Pt reports is usually on 2liters [place] all the time. Pt reports chest pain is left chest and sharp that is worse with movement.

## 2020-03-30 ENCOUNTER — Other Ambulatory Visit: Payer: Self-pay

## 2020-03-30 ENCOUNTER — Emergency Department (HOSPITAL_COMMUNITY)
Admission: EM | Admit: 2020-03-30 | Discharge: 2020-03-31 | Disposition: A | Discharge status: Home / Self Care | Payer: Medicare Other | Attending: Emergency Medicine | Admitting: Emergency Medicine

## 2020-03-30 ENCOUNTER — Emergency Department (HOSPITAL_COMMUNITY): Payer: Medicare Other

## 2020-03-30 ENCOUNTER — Encounter (HOSPITAL_COMMUNITY): Payer: Self-pay | Admitting: Emergency Medicine

## 2020-03-30 DIAGNOSIS — E1122 Type 2 diabetes mellitus with diabetic chronic kidney disease: Secondary | ICD-10-CM | POA: Diagnosis not present

## 2020-03-30 DIAGNOSIS — R0789 Other chest pain: Secondary | ICD-10-CM | POA: Diagnosis not present

## 2020-03-30 DIAGNOSIS — Z87891 Personal history of nicotine dependence: Secondary | ICD-10-CM | POA: Insufficient documentation

## 2020-03-30 DIAGNOSIS — Z79899 Other long term (current) drug therapy: Secondary | ICD-10-CM | POA: Insufficient documentation

## 2020-03-30 DIAGNOSIS — I130 Hypertensive heart and chronic kidney disease with heart failure and stage 1 through stage 4 chronic kidney disease, or unspecified chronic kidney disease: Secondary | ICD-10-CM | POA: Diagnosis not present

## 2020-03-30 DIAGNOSIS — J449 Chronic obstructive pulmonary disease, unspecified: Secondary | ICD-10-CM | POA: Insufficient documentation

## 2020-03-30 DIAGNOSIS — I5033 Acute on chronic diastolic (congestive) heart failure: Secondary | ICD-10-CM | POA: Diagnosis not present

## 2020-03-30 DIAGNOSIS — R1013 Epigastric pain: Secondary | ICD-10-CM | POA: Insufficient documentation

## 2020-03-30 DIAGNOSIS — J45909 Unspecified asthma, uncomplicated: Secondary | ICD-10-CM | POA: Diagnosis not present

## 2020-03-30 DIAGNOSIS — N183 Chronic kidney disease, stage 3 unspecified: Secondary | ICD-10-CM | POA: Diagnosis not present

## 2020-03-30 DIAGNOSIS — R079 Chest pain, unspecified: Secondary | ICD-10-CM

## 2020-03-30 LAB — BASIC METABOLIC PANEL
Anion gap: 10 (ref 5–15)
BUN: 25 mg/dL — ABNORMAL HIGH (ref 8–23)
CO2: 33 mmol/L — ABNORMAL HIGH (ref 22–32)
Calcium: 7.6 mg/dL — ABNORMAL LOW (ref 8.9–10.3)
Chloride: 99 mmol/L (ref 98–111)
Creatinine, Ser: 1.92 mg/dL — ABNORMAL HIGH (ref 0.61–1.24)
GFR calc Af Amer: 42 mL/min — ABNORMAL LOW (ref >60)
GFR calc non Af Amer: 36 mL/min — ABNORMAL LOW (ref >60)
Glucose, Bld: 108 mg/dL — ABNORMAL HIGH (ref 70–99)
Potassium: 3.9 mmol/L (ref 3.5–5.1)
Sodium: 142 mmol/L (ref 135–145)

## 2020-03-30 LAB — TROPONIN I (HIGH SENSITIVITY): Troponin I (High Sensitivity): 16 ng/L (ref <18)

## 2020-03-30 LAB — CBC
HCT: 38.1 % — ABNORMAL LOW (ref 39.0–52.0)
Hemoglobin: 11.3 g/dL — ABNORMAL LOW (ref 13.0–17.0)
MCH: 24.1 pg — ABNORMAL LOW (ref 26.0–34.0)
MCHC: 29.7 g/dL — ABNORMAL LOW (ref 30.0–36.0)
MCV: 81.4 fL (ref 80.0–100.0)
Platelets: 292 10*3/uL (ref 150–400)
RBC: 4.68 MIL/uL (ref 4.22–5.81)
RDW: 17.4 % — ABNORMAL HIGH (ref 11.5–15.5)
WBC: 9.3 10*3/uL (ref 4.0–10.5)
nRBC: 0 % (ref 0.0–0.2)

## 2020-03-30 MED ORDER — ALUM & MAG HYDROXIDE-SIMETH 200-200-20 MG/5ML PO SUSP
30.0000 mL | Freq: Once | ORAL | Status: AC
  Administered 2020-03-30: 30 mL via ORAL
  Filled 2020-03-30: qty 30

## 2020-03-30 MED ORDER — LIDOCAINE VISCOUS HCL 2 % MT SOLN
15.0000 mL | Freq: Once | OROMUCOSAL | Status: AC
  Administered 2020-03-30: 15 mL via ORAL
  Filled 2020-03-30: qty 15

## 2020-03-30 MED ORDER — SODIUM CHLORIDE 0.9% FLUSH: 3.0000 mL | Freq: Once | INTRAVENOUS | Status: DC

## 2020-03-30 MED ORDER — FAMOTIDINE 20 MG PO TABS
20.0000 mg | ORAL_TABLET | Freq: Once | ORAL | Status: AC
  Administered 2020-03-30: 20 mg via ORAL
  Filled 2020-03-30: qty 1

## 2020-03-30 MED ORDER — TRAMADOL HCL 50 MG PO TABS: 50.0000 mg | ORAL_TABLET | Freq: Four times a day (QID) | ORAL | 0 refills | Status: DC | PRN

## 2020-03-30 NOTE — ED Triage Notes (Signed)
Pt c/o chest pain and sob. He was seen for the same here a few days ago. Pt states he feels worse.

## 2020-04-01 ENCOUNTER — Emergency Department (HOSPITAL_COMMUNITY): Payer: Medicare Other

## 2020-04-01 ENCOUNTER — Ambulatory Visit: Payer: Self-pay | Admitting: *Deleted

## 2020-04-01 ENCOUNTER — Encounter (HOSPITAL_COMMUNITY): Payer: Self-pay | Admitting: Emergency Medicine

## 2020-04-01 ENCOUNTER — Other Ambulatory Visit: Payer: Self-pay

## 2020-04-01 ENCOUNTER — Emergency Department (HOSPITAL_COMMUNITY)
Admission: EM | Admit: 2020-04-01 | Discharge: 2020-04-01 | Disposition: A | Discharge status: Home / Self Care | Payer: Medicare Other | Attending: Emergency Medicine | Admitting: Emergency Medicine

## 2020-04-01 DIAGNOSIS — J449 Chronic obstructive pulmonary disease, unspecified: Secondary | ICD-10-CM | POA: Insufficient documentation

## 2020-04-01 DIAGNOSIS — M545 Low back pain, unspecified: Secondary | ICD-10-CM

## 2020-04-01 DIAGNOSIS — Z87891 Personal history of nicotine dependence: Secondary | ICD-10-CM | POA: Diagnosis not present

## 2020-04-01 DIAGNOSIS — E119 Type 2 diabetes mellitus without complications: Secondary | ICD-10-CM | POA: Insufficient documentation

## 2020-04-01 DIAGNOSIS — I10 Essential (primary) hypertension: Secondary | ICD-10-CM | POA: Insufficient documentation

## 2020-04-01 DIAGNOSIS — G8929 Other chronic pain: Secondary | ICD-10-CM | POA: Diagnosis not present

## 2020-04-01 LAB — CBC
HCT: 37.1 % — ABNORMAL LOW (ref 39.0–52.0)
Hemoglobin: 11.1 g/dL — ABNORMAL LOW (ref 13.0–17.0)
MCH: 24.2 pg — ABNORMAL LOW (ref 26.0–34.0)
MCHC: 29.9 g/dL — ABNORMAL LOW (ref 30.0–36.0)
MCV: 81 fL (ref 80.0–100.0)
Platelets: 266 10*3/uL (ref 150–400)
RBC: 4.58 MIL/uL (ref 4.22–5.81)
RDW: 17.5 % — ABNORMAL HIGH (ref 11.5–15.5)
WBC: 8.9 10*3/uL (ref 4.0–10.5)
nRBC: 0 % (ref 0.0–0.2)

## 2020-04-01 LAB — BASIC METABOLIC PANEL
Anion gap: 9 (ref 5–15)
BUN: 21 mg/dL (ref 8–23)
CO2: 30 mmol/L (ref 22–32)
Calcium: 8 mg/dL — ABNORMAL LOW (ref 8.9–10.3)
Chloride: 102 mmol/L (ref 98–111)
Creatinine, Ser: 1.5 mg/dL — ABNORMAL HIGH (ref 0.61–1.24)
GFR calc Af Amer: 57 mL/min — ABNORMAL LOW (ref >60)
GFR calc non Af Amer: 49 mL/min — ABNORMAL LOW (ref >60)
Glucose, Bld: 96 mg/dL (ref 70–99)
Potassium: 3.6 mmol/L (ref 3.5–5.1)
Sodium: 141 mmol/L (ref 135–145)

## 2020-04-01 LAB — TROPONIN I (HIGH SENSITIVITY): Troponin I (High Sensitivity): 14 ng/L (ref <18)

## 2020-04-01 MED ORDER — METHOCARBAMOL 500 MG PO TABS: 500.0000 mg | ORAL_TABLET | Freq: Three times a day (TID) | ORAL | 0 refills | Status: AC | PRN

## 2020-04-01 MED ORDER — LIDOCAINE 5 % EX PTCH
1.0000 | MEDICATED_PATCH | CUTANEOUS | 0 refills | Status: DC
Start: 2020-04-01 — End: 2020-07-06

## 2020-04-01 NOTE — ED Provider Notes (Signed)
[facility] Emergency Department Provider Note MRN:  [id number]  Arrival date & time: 04/01/20     Chief Complaint   Back Pain   History of Present Illness   Bryan [name] is a 64 y.o. year-old male with a history of chronic pain, COPD, diabetes presenting to the ED with chief complaint of back pain.  Low back pain, right lumbar region, nonradiating, present for "a while" but worse this morning when waking up.  Worse with motion or palpation.  Denies numbness or weakness to the arms or legs, no bowel or bladder dysfunction, no fever, no trauma.  Also endorsing shortness of breath which is chronic and not significantly changed, also endorsing chest pain which has been on and off for several weeks.  Denies cough, no abdominal pain.  Review of Systems  A complete 10 system review of systems was obtained and all systems are negative except as noted in the HPI and PMH.   Patient's Health History    Past Medical History:  Diagnosis Date  . Acid reflux   . Arthritis   . Asthma   . Chronic pain    with leg and back pain (disc problem)  . COPD (chronic obstructive pulmonary disease) ([place])   . Diabetes mellitus without complication ([place])   . Headache    HX OF  . History of upper GI x-ray series    to follow showed large duodenal ulcer H pylori serologies were negative  . Hypertension   . NSTEMI (non-ST elevated myocardial infarction) ([place]) 11/2019  . Pneumonia    07/17/17  . Pre-diabetes   . Stroke [facility])    TIA MINI STROKE  . Tubular adenoma     Past Surgical History:  Procedure Laterality Date  . BACK SURGERY  7/05; 5/09    Dr.Hirsch,3 lumbar  X3  . BACK SURGERY    . COLONOSCOPY  Feb 2012   Dr. Gala Romney: normal rectum, pedunculate polyp removed but not recovered  . COLONOSCOPY WITH ESOPHAGOGASTRODUODENOSCOPY (EGD) N/A 11/18/2013   Dr.Rourk- tcs= normal rectum, multipe polyps about the ileocecal valve and distal transverse colon o/w the remainder of the  colonic mucosa appeared normal bx= tubular adenoma. EGD= normal esophagus, stomach with scattered erosions mottling, friablility, no ulcer or infiltrating process patent pylorus bx= chronic inflammation. next TCS 11/2016  . COLONOSCOPY WITH PROPOFOL N/A 05/25/2017   Dr. Gala Romney: sigmoid diverticulosis, one 4 mm hyerplastic rectal polyp, ascending colonic AVMs surveillance 2023  . ESOPHAGOGASTRODUODENOSCOPY  1/06   Dr. Volney American esophageal erosions,U-shaped stomach,marked erosions and edema of the bulb without discrete ulcer disease.   . ESOPHAGOGASTRODUODENOSCOPY (EGD) WITH PROPOFOL N/A 05/25/2017   Dr. Gala Romney: reflux esophagitis s/p empiric dilation, normal stomach and duodenum  . ESOPHAGOGASTRODUODENOSCOPY (EGD) WITH PROPOFOL Left 11/30/2019   LA Grade D esophagitis, small hiatal hernia, one non-bleeding cratered gastric ulcer in prepyloric region of stomach, many non-bleeding cratered duodenal ulcers without stigmata of bleeding, mild luminal narrowing at apex of duodenal bulb but traversed easily. H.pylori serology negative.  Marland Kitchen MALONEY DILATION N/A 05/25/2017   Procedure: Venia Minks DILATION;  Surgeon: Daneil Dolin, MD;  [facility];  Service: Endoscopy;  Laterality: N/A;  . NECK SURGERY  10/2007   PLATE IN NECK  . POLYPECTOMY  05/25/2017   Procedure: POLYPECTOMY;  Surgeon: Daneil Dolin, MD;  [facility];  Service: Endoscopy;;  rectal  . RIGHT/LEFT HEART CATH AND CORONARY ANGIOGRAPHY N/A 12/03/2019   Procedure: RIGHT/LEFT HEART CATH AND CORONARY ANGIOGRAPHY;  Surgeon: Nigel Mormon, MD;  Location: [place] CV LAB;  Service: Cardiovascular;  Laterality: N/A;  . TRANSURETHRAL RESECTION OF PROSTATE N/A 08/17/2017   Procedure: TRANSURETHRAL RESECTION OF THE PROSTATE (TURP);  Surgeon: Irine Seal, MD;  Location: [facility];  Service: Urology;  Laterality: N/A;    Family History  Problem Relation Age of Onset  . Cancer Father   . Asthma Mother   . Colon cancer Neg Hx       Social History   Socioeconomic History  . Marital status: Single    Spouse name: Not on file  . Number of children: 2  . Years of education: 63  . Highest education level: High school graduate  Occupational History  . Occupation: disabled    Fish farm manager: UNEMPLOYED  Tobacco Use  . Smoking status: Former Smoker    Packs/day: 0.50    Years: 40.00    Pack years: 20.00    Types: Cigarettes    Quit date: 11/11/2014    Years since quitting: 5.3  . Smokeless tobacco: Never Used  Vaping Use  . Vaping Use: Never used  Substance and Sexual Activity  . Alcohol use: No    Alcohol/week: 0.0 standard drinks  . Drug use: Not Currently    Types: Marijuana    Comment: occas; denied 01/28/20  . Sexual activity: Not Currently    Birth control/protection: None  Other Topics Concern  . Not on file  Social History Narrative   Lives with girlfriend and son, is on disability for history of back injuries.   Social Determinants of Health   Financial Resource Strain: Medium Risk  . Difficulty of Paying Living Expenses: Somewhat hard  Food Insecurity: No Food Insecurity  . Worried About Charity fundraiser in the Last Year: Never true  . Ran Out of Food in the Last Year: Never true  Transportation Needs: No Transportation Needs  . Lack of Transportation (Medical): No  . Lack of Transportation (Non-Medical): No  Physical Activity: Inactive  . Days of Exercise per Week: 0 days  . Minutes of Exercise per Session: 0 min  Stress: No Stress Concern Present  . Feeling of Stress : Not at all  Social Connections: Socially Isolated  . Frequency of Communication with Friends and Family: More than three times a week  . Frequency of Social Gatherings with Friends and Family: More than three times a week  . Attends Religious Services: Never  . Active Member of Clubs or Organizations: No  . Attends Archivist Meetings: Never  . Marital Status: Never married  Intimate Partner Violence: Not At  Risk  . Fear of Current or Ex-Partner: No  . Emotionally Abused: No  . Physically Abused: No  . Sexually Abused: No     Physical Exam   Vitals:   04/01/20 0633  BP: (!) 161/99  Pulse: 78  Resp: (!) 22  Temp: 98.4 F (36.9 C)  SpO2: 99%    CONSTITUTIONAL: Chronically ill-appearing, NAD NEURO:  Alert and oriented x 3, normal and symmetric strength and sensation, normal coordination, normal speech EYES:  eyes equal and reactive ENT/NECK:  no LAD, no JVD CARDIO: Regular rate, well-perfused, normal S1 and S2 PULM:  CTAB no wheezing or rhonchi GI/GU:  normal bowel sounds, non-distended, non-tender MSK/SPINE:  No gross deformities, no edema SKIN:  no rash, atraumatic PSYCH:  Appropriate speech and behavior  *Additional and/or pertinent findings included in MDM below  Diagnostic and Interventional Summary    EKG  Interpretation  Date/Time:  Wednesday April 01 2020 06:37:59 EDT Ventricular Rate:  69 PR Interval:    QRS Duration: 86 QT Interval:  405 QTC Calculation: 434 R Axis:   90 Text Interpretation: Sinus rhythm Borderline right axis deviation Baseline wander Since last tracing rate slower 30 Mar 2020 Confirmed by Rolland Porter [phone]) on 04/01/2020 6:41:16 AM      Labs Reviewed  BASIC METABOLIC PANEL - Abnormal; Notable for the following components:      Result Value   Creatinine, Ser 1.50 (*)    Calcium 8.0 (*)    GFR calc non Af Amer 49 (*)    GFR calc Af Amer 57 (*)    All other components within normal limits  CBC - Abnormal; Notable for the following components:   Hemoglobin 11.1 (*)    HCT 37.1 (*)    MCH 24.2 (*)    MCHC 29.9 (*)    RDW 17.5 (*)    All other components within normal limits  TROPONIN I (HIGH SENSITIVITY)    DG Chest 2 View  Final Result      Medications - No data to display   Procedures  /  Critical Care Procedures  ED Course and Medical Decision Making  I have reviewed the triage vital signs, the nursing notes, and pertinent  available records from the EMR.  Listed above are laboratory and imaging tests that I personally ordered, reviewed, and interpreted and then considered in my medical decision making (see below for details).      Acute on chronic low back pain, history of hardware fixation in the past per chart review.  Also here with chronic shortness of breath and chest pain with recent work-ups reassuring.  Abdomen is soft and nontender, vital signs are reassuring, recent CT imaging showing normal caliber aorta.  Exam consistent with musculoskeletal pain with reproducible tenderness to the right lumbar region, no bony tenderness on my exam to warrant imaging.  Patient seems to have muscle strain or flare of arthritis that seems to be worse in the mornings and improves during the day.  Will work on his pain regiment, will obtain screening EKG, troponin, chest x-ray.  Anticipating discharge.  Work-up is reassuring, appropriate for discharge.  Barth Kirks. [place]onia Small, [place] [email or web address]  Final Clinical Impressions(s) / ED Diagnoses     ICD-10-CM   1. Chronic right-sided low back pain without sciatica  M54.5    G89.29     ED Discharge Orders         Ordered    methocarbamol (ROBAXIN) 500 MG tablet  Every 8 hours PRN     Discontinue  Reprint     04/01/20 0801    lidocaine (LIDODERM) 5 %  Every 24 hours     Discontinue  Reprint     04/01/20 0801           Discharge Instructions Discussed with and Provided to Patient:     Discharge Instructions     You were evaluated in the Emergency Department and after careful evaluation, we did not find any emergent condition requiring admission or further testing in the hospital.  Your exam/testing today was overall reassuring.  We encourage you to continue using Tylenol 1000 mg every 4-6 hours at home for discomfort.  You could also use the Lidoderm patches as needed on the area that hurts the most.  For  pain keeping you from sleeping at night, you can  use the muscle relaxer provided.  As discussed, you should follow up with your primary care doctor to discuss these symptoms.  Please return to the Emergency Department if you experience any worsening of your condition.  Thank you for allowing [place] to be a part of your care.        Maudie Flakes, MD 04/01/20 [phone]

## 2020-04-01 NOTE — ED Triage Notes (Signed)
Pt c/o lower back pain and SOB. Seen here recently for same.

## 2020-04-01 NOTE — Discharge Instructions (Addendum)
You were evaluated in the Emergency Department and after careful evaluation, we did not find any emergent condition requiring admission or further testing in the hospital.  Your exam/testing today was overall reassuring.  We encourage you to continue using Tylenol 1000 mg every 4-6 hours at home for discomfort.  You could also use the Lidoderm patches as needed on the area that hurts the most.  For pain keeping you from sleeping at night, you can use the muscle relaxer provided.  As discussed, you should follow up with your primary care doctor to discuss these symptoms.  Please return to the Emergency Department if you experience any worsening of your condition.  Thank you for allowing [place] to be a part of your care.

## 2020-04-02 ENCOUNTER — Other Ambulatory Visit: Payer: Self-pay

## 2020-04-02 ENCOUNTER — Encounter (HOSPITAL_COMMUNITY)
Admission: RE | Admit: 2020-04-02 | Discharge: 2020-04-02 | Disposition: A | Discharge status: Home / Self Care | Payer: Medicare Other | Source: Ambulatory Visit | Attending: Internal Medicine | Admitting: Internal Medicine

## 2020-04-02 ENCOUNTER — Other Ambulatory Visit: Payer: Self-pay | Admitting: *Deleted

## 2020-04-02 NOTE — Patient Outreach (Signed)
[facility]) Care Management  04/02/2020  Bryan [name] 1956-02-17 [id number]   THNoutreach to complex care patient  Bryan [name] was initially referred to [facility] on 10/07/19 as an [facility] UM referral for assistance with finding an affordable housing. Insurance:[employer] medicare  Bryan [name] able to verify HIPAA ([place] and Accountability Act) identifiers, date of birth (DOB) and address Reviewedthe purpose of the call -follow up on resources sent via mail and his outreach to housing department  Follow up Bryan [name] reports he has been to the ED frequently for sob When initially called today he was not at home but requested to call [facility] RN CM back when he arrived home He reports he remains shortness of breath (sob) but no audible wheezing is heard during the outreach He confirms he is not coughing up sputum  He denies smoking, being around others smoking. He reports lower back pain when walking, turning, bending He reports being about to reach without issues.  He informs [facility] RN CM he has chronic back pain He reports falling 30 or more years ago with history of 3 back surgeries. He reports seeing local orthopedic providers and pain management providers. He reports he was informed "there is nothing else I can do for you"  [facility] RN CM discussed visits to his primary care provider and pulmonologist He states he has been in contact with his primary care provider and the pcp RN is aware of his ED and hospital visits He reports he is scheduled to see his pcp in August 2021    Housing he reports the "ceiling is falling in" at the home he presently is living in. He confirms he is presently looking at apartments near [place] in [place], [place] where he and his son can live together    [facility] RN CM will follow up with Bryan [name] within the next 14-21 business days Routed note to MD   [place]. [name], RN, BSN, [place]  Coordinator Office number [phone] Mobile number [phone]  Main THN number [phone] Fax number [phone]

## 2020-04-05 NOTE — ED Provider Notes (Signed)
[facility] EMERGENCY DEPARTMENT Provider Note   CSN: [id number] Arrival date & time: 03/30/20  1951     History Chief Complaint  Patient presents with  . Chest Pain    Bryan [name] is a 64 y.o. male.  HPI   64 year old male with abdominal and chest pain.  Pain is in the upper abdomen/epigastrium and radiates up into the chest.  Associated with some chest tightness/mild dyspnea.  This has been an ongoing complaint.  He has been seen several times the emergency room including by myself 10 days ago.  Feels like symptoms are worsening.  No fevers or chills.  No vomiting or diarrhea.  No acute urinary complaints.  Reports compliance with his medications.  Past Medical History:  Diagnosis Date  . Acid reflux   . Arthritis   . Asthma   . Chronic pain    with leg and back pain (disc problem)  . COPD (chronic obstructive pulmonary disease) ([place])   . Diabetes mellitus without complication ([place])   . Headache    HX OF  . History of upper GI x-ray series    to follow showed large duodenal ulcer H pylori serologies were negative  . Hypertension   . NSTEMI (non-ST elevated myocardial infarction) ([place]) 11/2019  . Pneumonia    07/17/17  . Pre-diabetes   . Stroke [facility])    TIA MINI STROKE  . Tubular adenoma     Patient Active Problem List   Diagnosis Date Noted  . Acute on chronic respiratory failure with hypoxia ([place]) 02/14/2020  . Diverticulitis of colon   . Abnormal CT scan, colon   . RUQ pain   . Acute on chronic diastolic CHF (congestive heart failure) ([place]) 02/12/2020  . PUD (peptic ulcer disease) 01/28/2020  . Nonobstructive atherosclerosis of coronary artery   . Pulmonary hypertension due to COPD ([place])   . NSTEMI (non-ST elevated myocardial infarction) ([place]) 11/25/2019  . Non-insulin dependent type 2 diabetes mellitus ([place])   . Mixed hyperlipidemia   . Non-ST elevation (NSTEMI) myocardial infarction ([place]) 11/24/2019  . Oxygen dependent 11/24/2019  . Former smoker  11/24/2019  . Coronary artery calcification seen on CAT scan   . Angina pectoris ([place]) 11/23/2019  . SOB (shortness of breath) 11/23/2019  . Chronic diastolic CHF (congestive heart failure) ([place]) 08/03/2019  . Acute on chronic respiratory failure ([place]) 08/02/2019  . Acute exacerbation of CHF (congestive heart failure) /HFpEF (EF 60%) 06/13/2019  . Normocytic anemia 02/15/2019  . COPD exacerbation ([place]) 09/16/2018  . Hypokalemia 06/26/2018  . Hypomagnesemia 06/26/2018  . Anemia 06/26/2018  . BPH (benign prostatic hyperplasia) 06/26/2018  . Chronic respiratory failure with hypoxia ([place]) 06/04/2018  . HCAP (healthcare-associated pneumonia) 06/04/2018  . Acute respiratory failure with hypoxia ([place]) 04/14/2018  . Pulmonary nodule 04/14/2018  . CKD (chronic kidney disease) stage 3, GFR 30-59 ml/min 04/14/2018  . Type 2 diabetes mellitus with hyperlipidemia ([place]) 03/12/2018  . BPH with obstruction/lower urinary tract symptoms 08/17/2017  . Left sided numbness 07/22/2017  . TIA (transient ischemic attack) 07/22/2017  . Pneumonia 07/14/2017  . COPD with acute exacerbation ([place]) 07/14/2017  . Constipation 07/10/2017  . Hx of adenomatous colonic polyps 05/01/2017  . Benign hypertension with CKD (chronic kidney disease) stage III   . Chronic pain   . Heme positive stool 10/20/2014  . Epigastric pain 06/19/2014  . Dysphagia 06/19/2014  . AP (abdominal pain) 11/13/2013  . Early satiety 11/13/2013  . COPD (chronic obstructive pulmonary disease) ([place]) 02/22/2013  .  Low back pain 11/19/2012  . COLITIS 05/12/2009  . ABDOMINAL PAIN, LEFT LOWER QUADRANT 05/12/2009  . DUODENAL ULCER, HX OF 05/12/2009  . H/o Alcohol abuse- Quit in 2010 05/11/2009  . GERD 05/11/2009  . Diarrhea 05/11/2009  . SPONDYLOSIS, CERVICAL 07/03/2007  . NECK PAIN 07/03/2007    Past Surgical History:  Procedure Laterality Date  . BACK SURGERY  7/05; 5/09    Dr.Hirsch,3 lumbar  X3  . BACK SURGERY    . COLONOSCOPY  Feb  2012   Dr. Gala Romney: normal rectum, pedunculate polyp removed but not recovered  . COLONOSCOPY WITH ESOPHAGOGASTRODUODENOSCOPY (EGD) N/A 11/18/2013   Dr.Rourk- tcs= normal rectum, multipe polyps about the ileocecal valve and distal transverse colon o/w the remainder of the colonic mucosa appeared normal bx= tubular adenoma. EGD= normal esophagus, stomach with scattered erosions mottling, friablility, no ulcer or infiltrating process patent pylorus bx= chronic inflammation. next TCS 11/2016  . COLONOSCOPY WITH PROPOFOL N/A 05/25/2017   Dr. Gala Romney: sigmoid diverticulosis, one 4 mm hyerplastic rectal polyp, ascending colonic AVMs surveillance 2023  . ESOPHAGOGASTRODUODENOSCOPY  1/06   Dr. Volney American esophageal erosions,U-shaped stomach,marked erosions and edema of the bulb without discrete ulcer disease.   . ESOPHAGOGASTRODUODENOSCOPY (EGD) WITH PROPOFOL N/A 05/25/2017   Dr. Gala Romney: reflux esophagitis s/p empiric dilation, normal stomach and duodenum  . ESOPHAGOGASTRODUODENOSCOPY (EGD) WITH PROPOFOL Left 11/30/2019   LA Grade D esophagitis, small hiatal hernia, one non-bleeding cratered gastric ulcer in prepyloric region of stomach, many non-bleeding cratered duodenal ulcers without stigmata of bleeding, mild luminal narrowing at apex of duodenal bulb but traversed easily. H.pylori serology negative.  Marland Kitchen MALONEY DILATION N/A 05/25/2017   Procedure: Venia Minks DILATION;  Surgeon: Daneil Dolin, MD;  Location[facility];  Service: Endoscopy;  Laterality: N/A;  . NECK SURGERY  10/2007   PLATE IN NECK  . POLYPECTOMY  05/25/2017   Procedure: POLYPECTOMY;  Surgeon: Daneil Dolin, MD;  Location: [facility] ENDO SUITE;  Service: Endoscopy;;  rectal  . RIGHT/LEFT HEART CATH AND CORONARY ANGIOGRAPHY N/A 12/03/2019   Procedure: RIGHT/LEFT HEART CATH AND CORONARY ANGIOGRAPHY;  Surgeon: Nigel Mormon, MD;  Location: [place] CV LAB;  Service: Cardiovascular;  Laterality: N/A;  . TRANSURETHRAL RESECTION OF PROSTATE N/A  08/17/2017   Procedure: TRANSURETHRAL RESECTION OF THE PROSTATE (TURP);  Surgeon: Irine Seal, MD;  Location: [facility] ORS;  Service: Urology;  Laterality: N/A;       Family History  Problem Relation Age of Onset  . Cancer Father   . Asthma Mother   . Colon cancer Neg Hx     Social History   Tobacco Use  . Smoking status: Former Smoker    Packs/day: 0.50    Years: 40.00    Pack years: 20.00    Types: Cigarettes    Quit date: 11/11/2014    Years since quitting: 5.4  . Smokeless tobacco: Never Used  Vaping Use  . Vaping Use: Never used  Substance Use Topics  . Alcohol use: No    Alcohol/week: 0.0 standard drinks  . Drug use: Not Currently    Types: Marijuana    Comment: occas; denied 01/28/20    Home Medications Prior to Admission medications   Medication Sig Start Date End Date Taking? Authorizing Provider  acetaminophen (TYLENOL) 500 MG tablet Take 500 mg by mouth every 6 (six) hours as needed (pain.).     [provider]  albuterol (PROVENTIL) (2.5 MG/3ML) 0.083% nebulizer solution Take 3 mLs (2.5 mg total) by nebulization every  4 (four) hours as needed for wheezing or shortness of breath. 06/14/19   Roxan Hockey, MD  albuterol (VENTOLIN HFA) 108 (90 Base) MCG/ACT inhaler Inhale 2 puffs into the lungs every 4 (four) hours as needed for wheezing or shortness of breath. Do not use with nebulizer 06/14/19   Roxan Hockey, MD  calcium carbonate (TUMS - DOSED IN MG ELEMENTAL CALCIUM) 500 MG chewable tablet Chew 2 tablets (400 mg of elemental calcium total) by mouth 3 (three) times daily as needed for indigestion or heartburn. 12/04/19   Amin, Jeanella Flattery, MD  doxycycline (VIBRAMYCIN) 100 MG capsule Take 1 capsule (100 mg total) by mouth 2 (two) times daily. 03/21/20   Orpah Greek, MD  Fluticasone-Umeclidin-Vilant (TRELEGY ELLIPTA) 100-62.5-25 MCG/INH AEPB Inhale 1 puff into the lungs daily. 06/14/19   Roxan Hockey, MD  furosemide (LASIX) 40 MG tablet Take 1  tablet (40 mg total) by mouth daily. 02/15/20   Orson Eva, MD  gabapentin (NEURONTIN) 300 MG capsule Take 1 capsule (300 mg total) by mouth 2 (two) times daily. Pt. Says he is taking twice daily. Patient taking differently: Take 600 mg by mouth in the morning, at noon, and at bedtime.  06/14/19   Roxan Hockey, MD  guaiFENesin (MUCINEX) 600 MG 12 hr tablet Take 1 tablet (600 mg total) by mouth 2 (two) times daily. Patient taking differently: Take 600 mg by mouth daily.  06/14/19 06/13/20  Roxan Hockey, MD  lidocaine (LIDODERM) 5 % Place 1 patch onto the skin daily. Remove & Discard patch within 12 hours or as directed by MD 04/01/20   Maudie Flakes, MD  linaclotide [facility]) 72 MCG capsule Take 1 capsule (72 mcg total) by mouth daily before breakfast. Patient taking differently: Take 72 mcg by mouth daily as needed (constipation.).  06/14/19   Roxan Hockey, MD  magnesium oxide (MAG-OX) 400 MG tablet Take 400 mg by mouth daily.    [provider]  methocarbamol (ROBAXIN) 500 MG tablet Take 1 tablet (500 mg total) by mouth every 8 (eight) hours as needed for muscle spasms. 04/01/20   Maudie Flakes, MD  metoprolol tartrate (LOPRESSOR) 25 MG tablet Take 0.5 tablets (12.5 mg total) by mouth 2 (two) times daily. Patient not taking: Reported on 03/20/2020 12/04/19 03/20/21  Damita Lack, MD  OXYGEN Inhale 2.5 L into the lungs daily.    [provider]  pantoprazole (PROTONIX) 40 MG tablet Take 1 tablet (40 mg total) by mouth 2 (two) times daily before a meal. Patient taking differently: Take 40 mg by mouth in the morning and at bedtime.  12/04/19 03/20/21  Amin, Jeanella Flattery, MD  predniSONE (DELTASONE) 20 MG tablet Take 2 tablets (40 mg total) by mouth daily with breakfast. 03/21/20   Pollina, Gwenyth Allegra, MD  SUPREP BOWEL PREP KIT 17.5-3.13-1.6 GM/177ML SOLN Take 354 mLs by mouth once. 03/17/20   [provider]  traMADol (ULTRAM) 50 MG tablet Take 1 tablet (50 mg total) by  mouth every 6 (six) hours as needed. 03/30/20   Virgel Manifold, MD    Allergies    Patient has no known allergies.  Review of Systems   Review of Systems All systems reviewed and negative, other than as noted in HPI.  Physical Exam Updated Vital Signs BP 123/76 (BP Location: Right Arm)   Pulse 92   Temp 98.6 F (37 C) (Oral)   Resp (!) 25   Ht _0  (1.6 m)   Wt 55.3 kg  SpO2 99%   BMI 21.61 kg/m   Physical Exam Vitals and nursing note reviewed.  Constitutional:      General: He is not in acute distress.    Appearance: He is well-developed.  HENT:     Head: Normocephalic and atraumatic.  Eyes:     General:        Right eye: No discharge.        Left eye: No discharge.     Conjunctiva/sclera: Conjunctivae normal.  Cardiovascular:     Rate and Rhythm: Normal rate and regular rhythm.     Heart sounds: Normal heart sounds. No murmur heard.  No friction rub. No gallop.   Pulmonary:     Effort: Pulmonary effort is normal. No respiratory distress.     Breath sounds: Normal breath sounds.  Abdominal:     General: There is no distension.     Palpations: Abdomen is soft.     Tenderness: There is no abdominal tenderness.  Musculoskeletal:        General: No tenderness.     Cervical back: Neck supple.  Skin:    General: Skin is warm and dry.  Neurological:     Mental Status: He is alert.  Psychiatric:        Behavior: Behavior normal.        Thought Content: Thought content normal.     ED Results / Procedures / Treatments   Labs (all labs ordered are listed, but only abnormal results are displayed) Labs Reviewed  BASIC METABOLIC PANEL - Abnormal; Notable for the following components:      Result Value   CO2 33 (*)    Glucose, Bld 108 (*)    BUN 25 (*)    Creatinine, Ser 1.92 (*)    Calcium 7.6 (*)    GFR calc non Af Amer 36 (*)    GFR calc Af Amer 42 (*)    All other components within normal limits  CBC - Abnormal; Notable for the following components:    Hemoglobin 11.3 (*)    HCT 38.1 (*)    MCH 24.1 (*)    MCHC 29.7 (*)    RDW 17.4 (*)    All other components within normal limits  TROPONIN I (HIGH SENSITIVITY)    EKG None  Radiology No results found.   DG Chest 2 View  Result Date: 04/01/2020 CLINICAL DATA:  Shortness of breath.  History of COPD. EXAM: CHEST - 2 VIEW COMPARISON:  03/30/2020 FINDINGS: The cardiomediastinal silhouette is unchanged with normal heart size. The lungs remain hyperinflated with unchanged peribronchial thickening. No confluent airspace opacity, edema, pleural effusion, pneumothorax is identified. Anterior cervical fusion is noted. IMPRESSION: Stable bronchitic changes. Electronically Signed   By: Lakey Bores M.D.   On: 04/01/2020 07:52   DG Chest 2 View  Result Date: 03/30/2020 CLINICAL DATA:  Chest pain and shortness of breath for several days EXAM: CHEST - 2 VIEW COMPARISON:  Radiograph 03/27/2020, CT 11/23/2019 FINDINGS: Mild airways thickening. No consolidation, features of edema, pneumothorax, or effusion. The cardiomediastinal contours are stable from priors. No acute osseous or soft tissue abnormality. Degenerative changes are present in the imaged spine and shoulders. Prior cervical fusion. Nasal cannula overlies the chest and base of the neck. IMPRESSION: Mild airways thickening could reflect an acute or chronic bronchitis or reactive airways disease. No acute consolidative opacity. Electronically Signed   By: Lovena Le M.D.   On: 03/30/2020 20:48   DG Chest 2 View  Result Date: 03/27/2020 CLINICAL DATA:  Chest pain. EXAM: CHEST - 2 VIEW COMPARISON:  March 21, 2020. FINDINGS: The heart size and mediastinal contours are within normal limits. Both lungs are clear. No pneumothorax or pleural effusion is noted. The visualized skeletal structures are unremarkable. IMPRESSION: No active cardiopulmonary disease. Electronically Signed   By: Marijo Conception M.D.   On: 03/27/2020 18:55   DG Chest Port 1  View  Result Date: 03/21/2020 CLINICAL DATA:  Shortness of breath starting this morning. EXAM: PORTABLE CHEST 1 VIEW COMPARISON:  Chest x-rays dated 02/12/2020 and 02/02/2020. FINDINGS: Lungs are hyperexpanded. Chronic bronchitic changes noted centrally. No evidence of superimposed pneumonia or pulmonary edema. No pleural effusion or pneumothorax is seen. Heart size and mediastinal contours are stable. Osseous structures about the chest are unremarkable. IMPRESSION: 1. No active disease. No evidence of pneumonia or pulmonary edema. 2. Hyperexpanded lungs indicating COPD. Electronically Signed   By: Franki Cabot M.D.   On: 03/21/2020 07:13    Procedures Procedures (including critical care time)  Medications Ordered in ED Medications  alum & mag hydroxide-simeth (MAALOX/MYLANTA) 200-200-20 MG/5ML suspension 30 mL (30 mLs Oral Given 03/30/20 2137)    And  lidocaine (XYLOCAINE) 2 % viscous mouth solution 15 mL (15 mLs Oral Given 03/30/20 2137)  famotidine (PEPCID) tablet 20 mg (20 mg Oral Given 03/30/20 2137)    ED Course  I have reviewed the triage vital signs and the nursing notes.  Pertinent labs & imaging results that were available during my care of the patient were reviewed by me and considered in my medical decision making (see chart for details).    MDM Rules/Calculators/A&P                          64 year old male with abdominal/chest pain.  Atypical for ACS.  Doubt PE, dissection or other emergent process.  Similar symptoms ongoing for weeks.  No significant change in current presentation.  Afebrile.  Hemodynamically stable.  Outpatient GI follow-up. Final Clinical Impression(s) / ED Diagnoses Final diagnoses:  Chest pain, unspecified type    Rx / DC Orders ED Discharge Orders         Ordered    traMADol (ULTRAM) 50 MG tablet  Every 6 hours PRN     Discontinue  Reprint     03/30/20 2329           Virgel Manifold, MD 04/05/20 [phone]

## 2020-04-08 ENCOUNTER — Other Ambulatory Visit: Payer: Self-pay

## 2020-04-08 ENCOUNTER — Emergency Department (HOSPITAL_COMMUNITY): Payer: Medicare Other

## 2020-04-08 ENCOUNTER — Emergency Department (HOSPITAL_COMMUNITY)
Admission: EM | Admit: 2020-04-08 | Discharge: 2020-04-08 | Disposition: A | Discharge status: Home / Self Care | Payer: Medicare Other | Attending: Emergency Medicine | Admitting: Emergency Medicine

## 2020-04-08 ENCOUNTER — Encounter (HOSPITAL_COMMUNITY): Payer: Self-pay

## 2020-04-08 DIAGNOSIS — Z7951 Long term (current) use of inhaled steroids: Secondary | ICD-10-CM | POA: Diagnosis not present

## 2020-04-08 DIAGNOSIS — K2950 Unspecified chronic gastritis without bleeding: Secondary | ICD-10-CM | POA: Insufficient documentation

## 2020-04-08 DIAGNOSIS — N183 Chronic kidney disease, stage 3 unspecified: Secondary | ICD-10-CM | POA: Diagnosis not present

## 2020-04-08 DIAGNOSIS — Z7952 Long term (current) use of systemic steroids: Secondary | ICD-10-CM | POA: Insufficient documentation

## 2020-04-08 DIAGNOSIS — J441 Chronic obstructive pulmonary disease with (acute) exacerbation: Secondary | ICD-10-CM | POA: Diagnosis not present

## 2020-04-08 DIAGNOSIS — E119 Type 2 diabetes mellitus without complications: Secondary | ICD-10-CM | POA: Diagnosis not present

## 2020-04-08 DIAGNOSIS — I5033 Acute on chronic diastolic (congestive) heart failure: Secondary | ICD-10-CM | POA: Insufficient documentation

## 2020-04-08 DIAGNOSIS — I252 Old myocardial infarction: Secondary | ICD-10-CM | POA: Diagnosis not present

## 2020-04-08 DIAGNOSIS — Z8673 Personal history of transient ischemic attack (TIA), and cerebral infarction without residual deficits: Secondary | ICD-10-CM | POA: Insufficient documentation

## 2020-04-08 DIAGNOSIS — Z79899 Other long term (current) drug therapy: Secondary | ICD-10-CM | POA: Diagnosis not present

## 2020-04-08 DIAGNOSIS — Z87891 Personal history of nicotine dependence: Secondary | ICD-10-CM | POA: Insufficient documentation

## 2020-04-08 DIAGNOSIS — I130 Hypertensive heart and chronic kidney disease with heart failure and stage 1 through stage 4 chronic kidney disease, or unspecified chronic kidney disease: Secondary | ICD-10-CM | POA: Diagnosis not present

## 2020-04-08 DIAGNOSIS — R109 Unspecified abdominal pain: Secondary | ICD-10-CM | POA: Diagnosis present

## 2020-04-08 LAB — URINALYSIS, ROUTINE W REFLEX MICROSCOPIC
Bacteria, UA: NONE SEEN
Bilirubin Urine: NEGATIVE
Glucose, UA: NEGATIVE mg/dL
Hgb urine dipstick: NEGATIVE
Ketones, ur: NEGATIVE mg/dL
Leukocytes,Ua: NEGATIVE
Nitrite: NEGATIVE
Protein, ur: >=300 mg/dL — AB
Specific Gravity, Urine: 1.017 (ref 1.005–1.030)
pH: 6 (ref 5.0–8.0)

## 2020-04-08 LAB — CBC
HCT: 36.7 % — ABNORMAL LOW (ref 39.0–52.0)
Hemoglobin: 10.5 g/dL — ABNORMAL LOW (ref 13.0–17.0)
MCH: 23.9 pg — ABNORMAL LOW (ref 26.0–34.0)
MCHC: 28.6 g/dL — ABNORMAL LOW (ref 30.0–36.0)
MCV: 83.4 fL (ref 80.0–100.0)
Platelets: 246 10*3/uL (ref 150–400)
RBC: 4.4 MIL/uL (ref 4.22–5.81)
RDW: 18.1 % — ABNORMAL HIGH (ref 11.5–15.5)
WBC: 8.8 10*3/uL (ref 4.0–10.5)
nRBC: 0 % (ref 0.0–0.2)

## 2020-04-08 LAB — COMPREHENSIVE METABOLIC PANEL
ALT: 23 U/L (ref 0–44)
AST: 21 U/L (ref 15–41)
Albumin: 3.2 g/dL — ABNORMAL LOW (ref 3.5–5.0)
Alkaline Phosphatase: 74 U/L (ref 38–126)
Anion gap: 9 (ref 5–15)
BUN: 20 mg/dL (ref 8–23)
CO2: 30 mmol/L (ref 22–32)
Calcium: 8.4 mg/dL — ABNORMAL LOW (ref 8.9–10.3)
Chloride: 100 mmol/L (ref 98–111)
Creatinine, Ser: 1.59 mg/dL — ABNORMAL HIGH (ref 0.61–1.24)
GFR calc Af Amer: 53 mL/min — ABNORMAL LOW (ref >60)
GFR calc non Af Amer: 46 mL/min — ABNORMAL LOW (ref >60)
Glucose, Bld: 112 mg/dL — ABNORMAL HIGH (ref 70–99)
Potassium: 4.2 mmol/L (ref 3.5–5.1)
Sodium: 139 mmol/L (ref 135–145)
Total Bilirubin: 0.4 mg/dL (ref 0.3–1.2)
Total Protein: 6.4 g/dL — ABNORMAL LOW (ref 6.5–8.1)

## 2020-04-08 LAB — LIPASE, BLOOD: Lipase: 39 U/L (ref 11–51)

## 2020-04-08 MED ORDER — HYDROCODONE-ACETAMINOPHEN 5-325 MG PO TABS
1.0000 | ORAL_TABLET | Freq: Once | ORAL | Status: AC
  Administered 2020-04-08: 1 via ORAL
  Filled 2020-04-08: qty 1

## 2020-04-08 MED ORDER — SODIUM CHLORIDE 0.9% FLUSH: 3.0000 mL | Freq: Once | INTRAVENOUS | Status: DC

## 2020-04-08 MED ORDER — ALUM & MAG HYDROXIDE-SIMETH 200-200-20 MG/5ML PO SUSP
30.0000 mL | Freq: Once | ORAL | Status: AC
  Administered 2020-04-08: 30 mL via ORAL
  Filled 2020-04-08: qty 30

## 2020-04-08 MED ORDER — SUCRALFATE 1 GM/10ML PO SUSP
1.0000 g | Freq: Three times a day (TID) | ORAL | 0 refills | Status: DC
Start: 2020-04-08 — End: 2020-04-22

## 2020-04-08 NOTE — ED Provider Notes (Signed)
[facility] Provider Note   CSN: [id number] Arrival date & time: 04/08/20  7062     History Chief Complaint  Patient presents with  . Abdominal Pain    Bryan [name] is a 64 y.o. male.  HPI    64 year old male comes in a chief complaint of abdominal pain.  Patient has history of diabetes, esophagitis, COPD.  He reports that he has been having abdominal pain for the last several days now.  The abdominal pain is located in the epigastric region and has been constant for the last 2 weeks.  Patient has no specific evoking, aggravating or relieving factors.  He denies any associated nausea, vomiting, bloody stools.  Patient is supposed to see GI doctors next week for scopes.  Review of system is negative for chest pain.  Pain is nonradiating.    Past Medical History:  Diagnosis Date  . Acid reflux   . Arthritis   . Asthma   . Chronic pain    with leg and back pain (disc problem)  . COPD (chronic obstructive pulmonary disease) ([place])   . Diabetes mellitus without complication ([place])   . Headache    HX OF  . History of upper GI x-ray series    to follow showed large duodenal ulcer H pylori serologies were negative  . Hypertension   . NSTEMI (non-ST elevated myocardial infarction) ([place]) 11/2019  . Pneumonia    07/17/17  . Pre-diabetes   . Stroke [facility])    TIA MINI STROKE  . Tubular adenoma     Patient Active Problem List   Diagnosis Date Noted  . Acute on chronic respiratory failure with hypoxia ([place]) 02/14/2020  . Diverticulitis of colon   . Abnormal CT scan, colon   . RUQ pain   . Acute on chronic diastolic CHF (congestive heart failure) ([place]) 02/12/2020  . PUD (peptic ulcer disease) 01/28/2020  . Nonobstructive atherosclerosis of coronary artery   . Pulmonary hypertension due to COPD ([place])   . NSTEMI (non-ST elevated myocardial infarction) ([place]) 11/25/2019  . Non-insulin dependent type 2 diabetes mellitus ([place])   . Mixed hyperlipidemia   . Non-ST  elevation (NSTEMI) myocardial infarction ([place]) 11/24/2019  . Oxygen dependent 11/24/2019  . Former smoker 11/24/2019  . Coronary artery calcification seen on CAT scan   . Angina pectoris ([place]) 11/23/2019  . SOB (shortness of breath) 11/23/2019  . Chronic diastolic CHF (congestive heart failure) ([place]) 08/03/2019  . Acute on chronic respiratory failure ([place]) 08/02/2019  . Acute exacerbation of CHF (congestive heart failure) /HFpEF (EF 60%) 06/13/2019  . Normocytic anemia 02/15/2019  . COPD exacerbation ([place]) 09/16/2018  . Hypokalemia 06/26/2018  . Hypomagnesemia 06/26/2018  . Anemia 06/26/2018  . BPH (benign prostatic hyperplasia) 06/26/2018  . Chronic respiratory failure with hypoxia ([place]) 06/04/2018  . HCAP (healthcare-associated pneumonia) 06/04/2018  . Acute respiratory failure with hypoxia ([place]) 04/14/2018  . Pulmonary nodule 04/14/2018  . CKD (chronic kidney disease) stage 3, GFR 30-59 ml/min 04/14/2018  . Type 2 diabetes mellitus with hyperlipidemia ([place]) 03/12/2018  . BPH with obstruction/lower urinary tract symptoms 08/17/2017  . Left sided numbness 07/22/2017  . TIA (transient ischemic attack) 07/22/2017  . Pneumonia 07/14/2017  . COPD with acute exacerbation ([place]) 07/14/2017  . Constipation 07/10/2017  . Hx of adenomatous colonic polyps 05/01/2017  . Benign hypertension with CKD (chronic kidney disease) stage III   . Chronic pain   . Heme positive stool 10/20/2014  . Epigastric pain 06/19/2014  .  Dysphagia 06/19/2014  . AP (abdominal pain) 11/13/2013  . Early satiety 11/13/2013  . COPD (chronic obstructive pulmonary disease) ([place]) 02/22/2013  . Low back pain 11/19/2012  . COLITIS 05/12/2009  . ABDOMINAL PAIN, LEFT LOWER QUADRANT 05/12/2009  . DUODENAL ULCER, HX OF 05/12/2009  . H/o Alcohol abuse- Quit in 2010 05/11/2009  . GERD 05/11/2009  . Diarrhea 05/11/2009  . SPONDYLOSIS, CERVICAL 07/03/2007  . NECK PAIN 07/03/2007    Past Surgical History:  Procedure  Laterality Date  . BACK SURGERY  7/05; 5/09    Dr.Hirsch,3 lumbar  X3  . BACK SURGERY    . COLONOSCOPY  Feb 2012   Dr. Gala Romney: normal rectum, pedunculate polyp removed but not recovered  . COLONOSCOPY WITH ESOPHAGOGASTRODUODENOSCOPY (EGD) N/A 11/18/2013   Dr.Rourk- tcs= normal rectum, multipe polyps about the ileocecal valve and distal transverse colon o/w the remainder of the colonic mucosa appeared normal bx= tubular adenoma. EGD= normal esophagus, stomach with scattered erosions mottling, friablility, no ulcer or infiltrating process patent pylorus bx= chronic inflammation. next TCS 11/2016  . COLONOSCOPY WITH PROPOFOL N/A 05/25/2017   Dr. Gala Romney: sigmoid diverticulosis, one 4 mm hyerplastic rectal polyp, ascending colonic AVMs surveillance 2023  . ESOPHAGOGASTRODUODENOSCOPY  1/06   Dr. Volney American esophageal erosions,U-shaped stomach,marked erosions and edema of the bulb without discrete ulcer disease.   . ESOPHAGOGASTRODUODENOSCOPY (EGD) WITH PROPOFOL N/A 05/25/2017   Dr. Gala Romney: reflux esophagitis s/p empiric dilation, normal stomach and duodenum  . ESOPHAGOGASTRODUODENOSCOPY (EGD) WITH PROPOFOL Left 11/30/2019   LA Grade D esophagitis, small hiatal hernia, one non-bleeding cratered gastric ulcer in prepyloric region of stomach, many non-bleeding cratered duodenal ulcers without stigmata of bleeding, mild luminal narrowing at apex of duodenal bulb but traversed easily. H.pylori serology negative.  Marland Kitchen MALONEY DILATION N/A 05/25/2017   Procedure: Venia Minks DILATION;  Surgeon: Daneil Dolin, MD;  [facility];  Service: Endoscopy;  Laterality: N/A;  . NECK SURGERY  10/2007   PLATE IN NECK  . POLYPECTOMY  05/25/2017   Procedure: POLYPECTOMY;  Surgeon: Daneil Dolin, MD;  Location: [facility];  Service: Endoscopy;;  rectal  . RIGHT/LEFT HEART CATH AND CORONARY ANGIOGRAPHY N/A 12/03/2019   Procedure: RIGHT/LEFT HEART CATH AND CORONARY ANGIOGRAPHY;  Surgeon: Nigel Mormon, MD;   Location: [facility] CV LAB;  Service: Cardiovascular;  Laterality: N/A;  . TRANSURETHRAL RESECTION OF PROSTATE N/A 08/17/2017   Procedure: TRANSURETHRAL RESECTION OF THE PROSTATE (TURP);  Surgeon: Irine Seal, MD;  Location: [facility] ORS;  Service: Urology;  Laterality: N/A;       Family History  Problem Relation Age of Onset  . Cancer Father   . Asthma Mother   . Colon cancer Neg Hx     Social History   Tobacco Use  . Smoking status: Former Smoker    Packs/day: 0.50    Years: 40.00    Pack years: 20.00    Types: Cigarettes    Quit date: 11/11/2014    Years since quitting: 5.4  . Smokeless tobacco: Never Used  Vaping Use  . Vaping Use: Never used  Substance Use Topics  . Alcohol use: No    Alcohol/week: 0.0 standard drinks  . Drug use: Not Currently    Types: Marijuana    Comment: occas; denied 01/28/20    Home Medications Prior to Admission medications   Medication Sig Start Date End Date Taking? Authorizing Provider  acetaminophen (TYLENOL) 500 MG tablet Take 500 mg by mouth every 6 (six) hours as needed (pain.).  Yes [provider]  albuterol (PROVENTIL) (2.5 MG/3ML) 0.083% nebulizer solution Take 3 mLs (2.5 mg total) by nebulization every 4 (four) hours as needed for wheezing or shortness of breath. 06/14/19  Yes Emokpae, Courage, MD  albuterol (VENTOLIN HFA) 108 (90 Base) MCG/ACT inhaler Inhale 2 puffs into the lungs every 4 (four) hours as needed for wheezing or shortness of breath. Do not use with nebulizer 06/14/19  Yes Emokpae, Courage, MD  calcium carbonate (TUMS - DOSED IN MG ELEMENTAL CALCIUM) 500 MG chewable tablet Chew 2 tablets (400 mg of elemental calcium total) by mouth 3 (three) times daily as needed for indigestion or heartburn. 12/04/19  Yes Amin, Evoleth Nordmeyer Chirag, MD  Fluticasone-Umeclidin-Vilant (TRELEGY ELLIPTA) 100-62.5-25 MCG/INH AEPB Inhale 1 puff into the lungs daily. 06/14/19  Yes Emokpae, Courage, MD  furosemide (LASIX) 40 MG tablet Take 1 tablet  (40 mg total) by mouth daily. 02/15/20  Yes Tat, Shanon Brow, MD  gabapentin (NEURONTIN) 300 MG capsule Take 1 capsule (300 mg total) by mouth 2 (two) times daily. Pt. Says he is taking twice daily. Patient taking differently: Take 600 mg by mouth in the morning, at noon, and at bedtime.  06/14/19  Yes Emokpae, Courage, MD  guaiFENesin (MUCINEX) 600 MG 12 hr tablet Take 1 tablet (600 mg total) by mouth 2 (two) times daily. Patient taking differently: Take 600 mg by mouth daily.  06/14/19 06/13/20 Yes Roxan Hockey, MD  linaclotide (LINZESS) 72 MCG capsule Take 1 capsule (72 mcg total) by mouth daily before breakfast. Patient taking differently: Take 72 mcg by mouth daily as needed (constipation.).  06/14/19  Yes Emokpae, Courage, MD  magnesium oxide (MAG-OX) 400 MG tablet Take 400 mg by mouth daily.   Yes [provider]  methocarbamol (ROBAXIN) 500 MG tablet Take 1 tablet (500 mg total) by mouth every 8 (eight) hours as needed for muscle spasms. 04/01/20  Yes Maudie Flakes, MD  metoprolol tartrate (LOPRESSOR) 25 MG tablet Take 0.5 tablets (12.5 mg total) by mouth 2 (two) times daily. 12/04/19 03/20/21 Yes Amin, Jeanella Flattery, MD  OXYGEN Inhale 2.5 L into the lungs daily.   Yes [provider]  pantoprazole (PROTONIX) 40 MG tablet Take 1 tablet (40 mg total) by mouth 2 (two) times daily before a meal. Patient taking differently: Take 40 mg by mouth in the morning and at bedtime.  12/04/19 03/20/21 Yes Amin, Jeanella Flattery, MD  traMADol (ULTRAM) 50 MG tablet Take 1 tablet (50 mg total) by mouth every 6 (six) hours as needed. 03/30/20  Yes Virgel Manifold, MD  doxycycline (VIBRAMYCIN) 100 MG capsule Take 1 capsule (100 mg total) by mouth 2 (two) times daily. Patient not taking: Reported on 04/08/2020 03/21/20   Orpah Greek, MD  lidocaine (LIDODERM) 5 % Place 1 patch onto the skin daily. Remove & Discard patch within 12 hours or as directed by MD 04/01/20   Maudie Flakes, MD  predniSONE  (DELTASONE) 20 MG tablet Take 2 tablets (40 mg total) by mouth daily with breakfast. Patient not taking: Reported on 04/08/2020 03/21/20   Orpah Greek, MD  sucralfate (CARAFATE) 1 GM/10ML suspension Take 10 mLs (1 g total) by mouth 4 (four) times daily -  with meals and at bedtime. 04/08/20   Varney Biles, MD    Allergies    Patient has no known allergies.  Review of Systems   Review of Systems  Constitutional: Positive for activity change.  Respiratory: Negative for shortness of breath.   Cardiovascular:  Negative for chest pain.  Gastrointestinal: Positive for abdominal pain.  All other systems reviewed and are negative.   Physical Exam Updated Vital Signs BP (!) 164/94 (BP Location: Left Arm)   Pulse 84   Temp 98.7 F (37.1 C) (Oral)   Resp 22   Ht _0  (1.6 m)   Wt 55.3 kg   SpO2 92%   BMI 21.61 kg/m   Physical Exam Vitals and nursing note reviewed.  Constitutional:      Appearance: He is well-developed.  HENT:     Head: Normocephalic and atraumatic.  Eyes:     Conjunctiva/sclera: Conjunctivae normal.     Pupils: Pupils are equal, round, and reactive to light.  Cardiovascular:     Rate and Rhythm: Normal rate and regular rhythm.  Pulmonary:     Effort: Pulmonary effort is normal.     Breath sounds: Normal breath sounds.  Abdominal:     General: Bowel sounds are normal. There is no distension.     Palpations: Abdomen is soft. There is no mass.     Tenderness: There is abdominal tenderness in the right upper quadrant and epigastric area. There is no guarding or rebound.  Musculoskeletal:        General: No deformity.     Cervical back: Normal range of motion and neck supple.  Skin:    General: Skin is warm.  Neurological:     Mental Status: He is alert and oriented to person, place, and time.     ED Results / Procedures / Treatments   Labs (all labs ordered are listed, but only abnormal results are displayed) Labs Reviewed  COMPREHENSIVE  METABOLIC PANEL - Abnormal; Notable for the following components:      Result Value   Glucose, Bld 112 (*)    Creatinine, Ser 1.59 (*)    Calcium 8.4 (*)    Total Protein 6.4 (*)    Albumin 3.2 (*)    GFR calc non Af Amer 46 (*)    GFR calc Af Amer 53 (*)    All other components within normal limits  CBC - Abnormal; Notable for the following components:   Hemoglobin 10.5 (*)    HCT 36.7 (*)    MCH 23.9 (*)    MCHC 28.6 (*)    RDW 18.1 (*)    All other components within normal limits  URINALYSIS, ROUTINE W REFLEX MICROSCOPIC - Abnormal; Notable for the following components:   Protein, ur >=300 (*)    All other components within normal limits  LIPASE, BLOOD    EKG EKG Interpretation  Date/Time:  Wednesday April 08 2020 10:18:39 EDT Ventricular Rate:  81 PR Interval:    QRS Duration: 121 QT Interval:  428 QTC Calculation: 500 R Axis:   106 Text Interpretation: Sinus rhythm Borderline short PR interval Nonspecific intraventricular conduction delay Repol abnrm suggests ischemia, inferior leads Minimal ST elevation, lateral leads No acute changes Nonspecific ST abnormality - could be artifact Confirmed by Varney Biles [id number]) on 04/08/2020 12:08:23 PM   Radiology DG ABD ACUTE 2+V W 1V CHEST  Result Date: 04/08/2020 CLINICAL DATA:  Abdominal pain and distension EXAM: DG ABDOMEN ACUTE W/ 1V CHEST COMPARISON:  Chest radiograph December 02, 2019; CT abdomen and pelvis February 12, 2020 FINDINGS: PA chest: No edema or airspace opacity. Heart size and pulmonary vascularity are normal. No adenopathy. Supine and upright abdomen: Moderate stool noted in colon. No bowel dilatation or air-fluid level to suggest bowel obstruction. No  free air. Postoperative change on the left in the lower lumbar region. Probable small phlebolith inferior right pelvis. IMPRESSION: No bowel obstruction or free air. Moderate stool in colon. No edema or airspace opacity. Electronically Signed   By: Lowella Grip III  M.D.   On: 04/08/2020 11:03    Procedures Procedures (including critical care time)  Medications Ordered in ED Medications  sodium chloride flush (NS) 0.9 % injection 3 mL (has no administration in time range)  alum & mag hydroxide-simeth (MAALOX/MYLANTA) 200-200-20 MG/5ML suspension 30 mL (30 mLs Oral Given 04/08/20 1057)  HYDROcodone-acetaminophen (NORCO/VICODIN) 5-325 MG per tablet 1 tablet (1 tablet Oral Given 04/08/20 1057)    ED Course  I have reviewed the triage vital signs and the nursing notes.  Pertinent labs & imaging results that were available during my care of the patient were reviewed by me and considered in my medical decision making (see chart for details).  Clinical Course as of Apr 08 1521  Wed Apr 08, 2020  1522 Patient reassessed.  He reports that his pain has improved.  I have reviewed his labs 1 more time in the did not show any signs of concerns.  Hemodynamically has remained stable.  The patient appears reasonably screened and/or stabilized for discharge and I doubt any other medical condition or other [facility] requiring further screening, evaluation, or treatment in the ED at this time prior to discharge.  Results from the ER workup discussed with the patient face to face and all questions answered to the best of my ability. The patient is safe for discharge with strict return precautions.     [AN]    Clinical Course User Index [AN] Varney Biles, MD   MDM Rules/Calculators/A&P                          64 year old male comes in a chief complaint of 2 weeks of epigastric abdominal pain.  He is noted to have some right-sided abdominal pain on exam without Murphy's. It appears that the pain has been constant for at least the last 2 weeks.  The exam is not indicative of any peritoneal findings.  There is no specific aggravating, relieving factors associated with this pain and there is no chest pain/back pain.  Plan is to get basic labs and acute abdominal  series, the latter to ensure there is no free air.  I have reviewed the CT scan that patient had few months back and also the upper and lower endoscopy that he had couple of years ago.  Clinically it appears that he is having PUD.  Plan is to reassess the patient after he gets viscous lidocaine to see how his pain is.  If the labs are reassuring and the pain has improved with viscous lidocaine then he will be discharged with outpatient follow-up.    Final Clinical Impression(s) / ED Diagnoses Final diagnoses:  Chronic gastritis without bleeding, unspecified gastritis type    Rx / DC Orders ED Discharge Orders         Ordered    sucralfate (CARAFATE) 1 GM/10ML suspension  3 times daily with meals & bedtime     Discontinue  Reprint     04/08/20 Polson, Lanard Arguijo, MD 04/08/20 [phone]

## 2020-04-08 NOTE — ED Triage Notes (Signed)
Pt reports generalized abd pain x 1 week.  Denies any n/v/d.  Reports lbm was this morning and was normal.

## 2020-04-08 NOTE — Discharge Instructions (Addendum)
Your symptoms appear to be due to severe gastritis. Please follow-up with your GI doctor as planned.  Start taking Carafate as prescribed.

## 2020-04-08 NOTE — ED Notes (Signed)
Advised patient we needed urine specimen.  Unable to provide at this time, urinal left at bedside.

## 2020-04-10 ENCOUNTER — Other Ambulatory Visit (HOSPITAL_COMMUNITY): Payer: Medicare Other

## 2020-04-13 ENCOUNTER — Ambulatory Visit (HOSPITAL_COMMUNITY): Admit: 2020-04-13 | Payer: Medicare Other | Admitting: Internal Medicine

## 2020-04-13 ENCOUNTER — Other Ambulatory Visit (HOSPITAL_COMMUNITY): Payer: Medicare Other

## 2020-04-13 ENCOUNTER — Encounter (HOSPITAL_COMMUNITY): Payer: Self-pay

## 2020-04-13 SURGERY — ESOPHAGOGASTRODUODENOSCOPY (EGD) WITH PROPOFOL
Anesthesia: Monitor Anesthesia Care

## 2020-04-14 ENCOUNTER — Other Ambulatory Visit (HOSPITAL_COMMUNITY)
Admission: RE | Admit: 2020-04-14 | Discharge: 2020-04-14 | Disposition: A | Discharge status: Home / Self Care | Payer: Medicare Other | Source: Ambulatory Visit | Attending: Internal Medicine | Admitting: Internal Medicine

## 2020-04-14 ENCOUNTER — Other Ambulatory Visit: Payer: Self-pay

## 2020-04-14 DIAGNOSIS — Z01812 Encounter for preprocedural laboratory examination: Secondary | ICD-10-CM | POA: Diagnosis present

## 2020-04-14 DIAGNOSIS — Z20822 Contact with and (suspected) exposure to covid-19: Secondary | ICD-10-CM | POA: Diagnosis not present

## 2020-04-14 LAB — SARS CORONAVIRUS 2 (TAT 6-24 HRS): SARS Coronavirus 2: NEGATIVE

## 2020-04-16 ENCOUNTER — Ambulatory Visit (HOSPITAL_COMMUNITY)
Admission: RE | Admit: 2020-04-16 | Discharge: 2020-04-16 | Disposition: A | Discharge status: Home / Self Care | Payer: Medicare Other | Attending: Internal Medicine | Admitting: Internal Medicine

## 2020-04-16 ENCOUNTER — Other Ambulatory Visit: Payer: Self-pay

## 2020-04-16 ENCOUNTER — Ambulatory Visit (HOSPITAL_COMMUNITY): Payer: Medicare Other | Admitting: Anesthesiology

## 2020-04-16 ENCOUNTER — Other Ambulatory Visit: Payer: Self-pay | Admitting: *Deleted

## 2020-04-16 ENCOUNTER — Encounter (HOSPITAL_COMMUNITY): Payer: Self-pay | Admitting: Internal Medicine

## 2020-04-16 ENCOUNTER — Encounter (HOSPITAL_COMMUNITY)
Admission: RE | Disposition: A | Discharge status: Home / Self Care | Payer: Self-pay | Source: Home / Self Care | Attending: Internal Medicine

## 2020-04-16 DIAGNOSIS — I25119 Atherosclerotic heart disease of native coronary artery with unspecified angina pectoris: Secondary | ICD-10-CM | POA: Diagnosis not present

## 2020-04-16 DIAGNOSIS — K621 Rectal polyp: Secondary | ICD-10-CM | POA: Insufficient documentation

## 2020-04-16 DIAGNOSIS — Z7951 Long term (current) use of inhaled steroids: Secondary | ICD-10-CM | POA: Insufficient documentation

## 2020-04-16 DIAGNOSIS — E119 Type 2 diabetes mellitus without complications: Secondary | ICD-10-CM | POA: Insufficient documentation

## 2020-04-16 DIAGNOSIS — K219 Gastro-esophageal reflux disease without esophagitis: Secondary | ICD-10-CM | POA: Diagnosis not present

## 2020-04-16 DIAGNOSIS — Z87891 Personal history of nicotine dependence: Secondary | ICD-10-CM | POA: Insufficient documentation

## 2020-04-16 DIAGNOSIS — I2510 Atherosclerotic heart disease of native coronary artery without angina pectoris: Secondary | ICD-10-CM | POA: Insufficient documentation

## 2020-04-16 DIAGNOSIS — Z86010 Personal history of colonic polyps: Secondary | ICD-10-CM | POA: Diagnosis not present

## 2020-04-16 DIAGNOSIS — J449 Chronic obstructive pulmonary disease, unspecified: Secondary | ICD-10-CM | POA: Insufficient documentation

## 2020-04-16 DIAGNOSIS — R933 Abnormal findings on diagnostic imaging of other parts of digestive tract: Secondary | ICD-10-CM | POA: Diagnosis not present

## 2020-04-16 DIAGNOSIS — I110 Hypertensive heart disease with heart failure: Secondary | ICD-10-CM | POA: Insufficient documentation

## 2020-04-16 DIAGNOSIS — G8929 Other chronic pain: Secondary | ICD-10-CM | POA: Insufficient documentation

## 2020-04-16 DIAGNOSIS — I252 Old myocardial infarction: Secondary | ICD-10-CM | POA: Diagnosis not present

## 2020-04-16 DIAGNOSIS — I509 Heart failure, unspecified: Secondary | ICD-10-CM | POA: Insufficient documentation

## 2020-04-16 DIAGNOSIS — Z79899 Other long term (current) drug therapy: Secondary | ICD-10-CM | POA: Diagnosis not present

## 2020-04-16 DIAGNOSIS — E118 Type 2 diabetes mellitus with unspecified complications: Secondary | ICD-10-CM | POA: Diagnosis not present

## 2020-04-16 DIAGNOSIS — Z8711 Personal history of peptic ulcer disease: Secondary | ICD-10-CM | POA: Diagnosis not present

## 2020-04-16 DIAGNOSIS — Z825 Family history of asthma and other chronic lower respiratory diseases: Secondary | ICD-10-CM | POA: Diagnosis not present

## 2020-04-16 DIAGNOSIS — Z7952 Long term (current) use of systemic steroids: Secondary | ICD-10-CM | POA: Insufficient documentation

## 2020-04-16 DIAGNOSIS — M1990 Unspecified osteoarthritis, unspecified site: Secondary | ICD-10-CM | POA: Insufficient documentation

## 2020-04-16 DIAGNOSIS — Z8673 Personal history of transient ischemic attack (TIA), and cerebral infarction without residual deficits: Secondary | ICD-10-CM | POA: Insufficient documentation

## 2020-04-16 DIAGNOSIS — K279 Peptic ulcer, site unspecified, unspecified as acute or chronic, without hemorrhage or perforation: Secondary | ICD-10-CM | POA: Diagnosis not present

## 2020-04-16 DIAGNOSIS — D649 Anemia, unspecified: Secondary | ICD-10-CM | POA: Insufficient documentation

## 2020-04-16 DIAGNOSIS — R519 Headache, unspecified: Secondary | ICD-10-CM | POA: Insufficient documentation

## 2020-04-16 DIAGNOSIS — Z9981 Dependence on supplemental oxygen: Secondary | ICD-10-CM | POA: Insufficient documentation

## 2020-04-16 DIAGNOSIS — Z809 Family history of malignant neoplasm, unspecified: Secondary | ICD-10-CM | POA: Diagnosis not present

## 2020-04-16 DIAGNOSIS — R0602 Shortness of breath: Secondary | ICD-10-CM | POA: Insufficient documentation

## 2020-04-16 DIAGNOSIS — I10 Essential (primary) hypertension: Secondary | ICD-10-CM | POA: Diagnosis not present

## 2020-04-16 DIAGNOSIS — K2100 Gastro-esophageal reflux disease with esophagitis, without bleeding: Secondary | ICD-10-CM | POA: Insufficient documentation

## 2020-04-16 HISTORY — PX: COLONOSCOPY WITH PROPOFOL: SHX5780

## 2020-04-16 HISTORY — PX: ESOPHAGOGASTRODUODENOSCOPY (EGD) WITH PROPOFOL: SHX5813

## 2020-04-16 LAB — GLUCOSE, CAPILLARY
Glucose-Capillary: 76 mg/dL (ref 70–99)
Glucose-Capillary: 104 mg/dL — ABNORMAL HIGH (ref 70–99)
Glucose-Capillary: 98 mg/dL (ref 70–99)

## 2020-04-16 SURGERY — COLONOSCOPY WITH PROPOFOL
Anesthesia: General

## 2020-04-16 MED ORDER — GLYCOPYRROLATE 0.2 MG/ML IJ SOLN
0.2000 mg | Freq: Once | INTRAMUSCULAR | Status: AC
  Administered 2020-04-16: 0.2 mg via INTRAVENOUS

## 2020-04-16 MED ORDER — IPRATROPIUM-ALBUTEROL 0.5-2.5 (3) MG/3ML IN SOLN
RESPIRATORY_TRACT | Status: AC
  Filled 2020-04-16: qty 3

## 2020-04-16 MED ORDER — PROPOFOL 10 MG/ML IV BOLUS
INTRAVENOUS | Status: AC
  Filled 2020-04-16: qty 60

## 2020-04-16 MED ORDER — STERILE WATER FOR IRRIGATION IR SOLN
Status: DC | PRN
  Administered 2020-04-16: 1.5 mL

## 2020-04-16 MED ORDER — DEXTROSE 50 % IV SOLN
25.0000 mL | Freq: Once | INTRAVENOUS | Status: AC
  Administered 2020-04-16: 25 mL via INTRAVENOUS

## 2020-04-16 MED ORDER — CHLORHEXIDINE GLUCONATE CLOTH 2 % EX PADS: 6.0000 | MEDICATED_PAD | Freq: Once | CUTANEOUS | Status: DC

## 2020-04-16 MED ORDER — PHENYLEPHRINE HCL (PRESSORS) 10 MG/ML IV SOLN
INTRAVENOUS | Status: DC | PRN
Start: 2020-04-16 — End: 2020-04-16
  Administered 2020-04-16: 80 ug via INTRAVENOUS
  Administered 2020-04-16: 40 ug via INTRAVENOUS
  Administered 2020-04-16: 80 ug via INTRAVENOUS

## 2020-04-16 MED ORDER — LACTATED RINGERS IV SOLN: Freq: Once | INTRAVENOUS | Status: AC

## 2020-04-16 MED ORDER — PROPOFOL 500 MG/50ML IV EMUL
INTRAVENOUS | Status: DC | PRN
  Administered 2020-04-16: 15 ug/kg/min via INTRAVENOUS

## 2020-04-16 MED ORDER — DEXTROSE 50 % IV SOLN
INTRAVENOUS | Status: AC
  Filled 2020-04-16: qty 50

## 2020-04-16 MED ORDER — METOPROLOL TARTRATE 5 MG/5ML IV SOLN
INTRAVENOUS | Status: DC | PRN
Start: 2020-04-16 — End: 2020-04-16
  Administered 2020-04-16: 2 mg via INTRAVENOUS

## 2020-04-16 MED ORDER — LACTATED RINGERS IV SOLN
INTRAVENOUS | Status: DC | PRN
Start: 2020-04-16 — End: 2020-04-16

## 2020-04-16 MED ORDER — METOPROLOL TARTRATE 5 MG/5ML IV SOLN
INTRAVENOUS | Status: AC
  Filled 2020-04-16: qty 5

## 2020-04-16 MED ORDER — LIDOCAINE VISCOUS HCL 2 % MT SOLN
15.0000 mL | Freq: Once | OROMUCOSAL | Status: AC
  Administered 2020-04-16: 15 mL via OROMUCOSAL

## 2020-04-16 MED ORDER — PROPOFOL 10 MG/ML IV BOLUS
INTRAVENOUS | Status: DC | PRN
  Administered 2020-04-16 (×3): 20 mg via INTRAVENOUS
  Administered 2020-04-16: 30 mg via INTRAVENOUS
  Administered 2020-04-16 (×2): 20 mg via INTRAVENOUS

## 2020-04-16 MED ORDER — IPRATROPIUM-ALBUTEROL 0.5-2.5 (3) MG/3ML IN SOLN
3.0000 mL | Freq: Once | RESPIRATORY_TRACT | Status: AC
  Administered 2020-04-16: 3 mL via RESPIRATORY_TRACT

## 2020-04-16 MED ORDER — GLYCOPYRROLATE 0.2 MG/ML IJ SOLN
INTRAMUSCULAR | Status: AC
  Filled 2020-04-16: qty 1

## 2020-04-16 MED ORDER — LIDOCAINE VISCOUS HCL 2 % MT SOLN
OROMUCOSAL | Status: AC
  Filled 2020-04-16: qty 15

## 2020-04-16 NOTE — Op Note (Signed)
[facility] Patient Name: Bryan [name] Procedure Date: 04/16/2020 8:25 AM MRN: [id number] Date of Birth: Mar 09, 1956 Attending MD: Norvel Richards , MD CSN: [id number] Age: 64 Admit Type: Inpatient Procedure:                Colonoscopy Indications:              Abnormal CT of the GI tract Providers:                Norvel Richards, MD, Otis Peak B. Sharon Seller, RN,                            Aram Candela Referring MD:              Medicines:                Propofol per Anesthesia Complications:            No immediate complications. Estimated Blood Loss:     Estimated blood loss was minimal. Procedure:                Pre-Anesthesia Assessment:                           - Prior to the procedure, a History and Physical                            was performed, and patient medications and                            allergies were reviewed. The patient's tolerance of                            previous anesthesia was also reviewed. The risks                            and benefits of the procedure and the sedation                            options and risks were discussed with the patient.                            All questions were answered, and informed consent                            was obtained. Prior Anticoagulants: The patient has                            taken no previous anticoagulant or antiplatelet                            agents. ASA Grade Assessment: III - A patient with                            severe systemic disease. After reviewing the risks  and benefits, the patient was deemed in                            satisfactory condition to undergo the procedure.                           After obtaining informed consent, the colonoscope                            was passed under direct vision. Throughout the                            procedure, the patient's blood pressure, pulse, and                            oxygen saturations were  monitored continuously. The                            [id number]-[id number] ([id number]) scope was introduced through                            the anus and advanced to the the cecum, identified                            by appendiceal orifice and ileocecal valve. The                            colonoscopy was performed without difficulty. The                            patient tolerated the procedure well. The quality                            of the bowel preparation was adequate. Scope In: 9:08:09 AM Scope Out: 9:18:59 AM Scope Withdrawal Time: 0 hours 8 minutes 36 seconds  Total Procedure Duration: 0 hours 10 minutes 50 seconds  Findings:      The perianal and digital rectal examinations were normal.      A 5 mm polyp was found in the proximal rectum. The polyp was sessile.       The polyp was removed with a cold snare. Resection and retrieval were       complete. Estimated blood loss was minimal.      The exam was otherwise without abnormality on direct and retroflexion       views. Impression:               - One 5 mm polyp in the rectum, removed with a cold                            snare. Resected and retrieved.                           - The examination was otherwise normal on direct  and retroflexion views. Moderate Sedation:      Moderate (conscious) sedation was personally administered by an       anesthesia professional. The following parameters were monitored: oxygen       saturation, heart rate, blood pressure, respiratory rate, EKG, adequacy       of pulmonary ventilation, and response to care. Recommendation:           - Patient has a contact number available for                            emergencies. The signs and symptoms of potential                            delayed complications were discussed with the                            patient. Return to normal activities tomorrow.                            Written discharge instructions were provided  to the                            patient.                           - Resume previous diet.                           - Continue present medications.                           - Repeat colonoscopy date to be determined after                            pending pathology results are reviewed for                            surveillance based on pathology results.                           - Return to GI office (date not yet determined).                            See ED report. Procedure Code(s):        --- Professional ---                           [id number], Colonoscopy, flexible; with removal of                            tumor(s), polyp(s), or other lesion(s) by snare                            technique Diagnosis Code(s):        --- Professional ---  K62.1, Rectal polyp                           R93.3, Abnormal findings on diagnostic imaging of                            other parts of digestive tract CPT copyright 2019 [employer]. All rights reserved. The codes documented in this report are preliminary and upon coder review may  be revised to meet current compliance requirements. Cristopher Estimable. Audianna Landgren, MD Norvel Richards, MD 04/16/2020 9:34:22 AM This report has been signed electronically. Number of Addenda: 0

## 2020-04-16 NOTE — Anesthesia Preprocedure Evaluation (Addendum)
Anesthesia Evaluation  Patient identified by MRN, date of birth, ID band Patient awake    Reviewed: Allergy & Precautions, NPO status , Patient's Chart, lab work & pertinent test results  History of Anesthesia Complications Negative for: history of anesthetic complications  Airway Mallampati: II  TM Distance: >3 FB Neck ROM: Full    Dental  (+) Missing, Dental Advisory Given   Pulmonary shortness of breath, with exertion and Long-Term Oxygen Therapy, asthma , pneumonia, resolved, COPD,  COPD inhaler and oxygen dependent, former smoker,    Pulmonary exam normal breath sounds clear to auscultation       Cardiovascular Exercise Tolerance: Poor hypertension, Pt. on medications and Pt. on home beta blockers + angina with exertion + CAD, + Past MI and +CHF  Normal cardiovascular exam Rhythm:Regular Rate:Normal     Neuro/Psych  Headaches, PSYCHIATRIC DISORDERS TIACVA    GI/Hepatic PUD, GERD  Medicated,  Endo/Other  diabetes  Renal/GU Renal disease     Musculoskeletal  (+) Arthritis ,   Abdominal   Peds  Hematology  (+) anemia ,   Anesthesia Other Findings   Reproductive/Obstetrics                           Anesthesia Physical Anesthesia Plan  ASA: IV  Anesthesia Plan: General   Post-op Pain Management:    Induction: Intravenous  PONV Risk Score and Plan: TIVA and Treatment may vary due to age or medical condition  Airway Management Planned: Nasal Cannula, Natural Airway and Simple Face Mask  Additional Equipment:   Intra-op Plan:   Post-operative Plan:   Informed Consent: I have reviewed the patients History and Physical, chart, labs and discussed the procedure including the risks, benefits and alternatives for the proposed anesthesia with the patient or authorized representative who has indicated his/her understanding and acceptance.     Dental advisory given  Plan Discussed  with: CRNA  Anesthesia Plan Comments:        Anesthesia Quick Evaluation

## 2020-04-16 NOTE — Op Note (Signed)
[facility] Patient Name: Bryan [name] Procedure Date: 04/16/2020 9:23 AM MRN: [id number] Date of Birth: 1956-02-21 Attending MD: Norvel Richards , MD CSN: [id number] Age: 64 Admit Type: Inpatient Procedure:                Upper GI endoscopy Indications:              Peptic ulcer Providers:                Norvel Richards, MD, Gwenlyn Fudge RN, RN,                            Aram Candela Referring MD:              Medicines:                Propofol per Anesthesia Complications:            No immediate complications. Estimated Blood Loss:     Estimated blood loss: none. Procedure:                Pre-Anesthesia Assessment:                           - Prior to the procedure, a History and Physical                            was performed, and patient medications and                            allergies were reviewed. The patient's tolerance of                            previous anesthesia was also reviewed. The risks                            and benefits of the procedure and the sedation                            options and risks were discussed with the patient.                            All questions were answered, and informed consent                            was obtained. Prior Anticoagulants: The patient has                            taken no previous anticoagulant or antiplatelet                            agents. ASA Grade Assessment: III - A patient with                            severe systemic disease. After reviewing the risks  and benefits, the patient was deemed in                            satisfactory condition to undergo the procedure.                           After obtaining informed consent, the endoscope was                            passed under direct vision. Throughout the                            procedure, the patient's blood pressure, pulse, and                            oxygen saturations were monitored  continuously. The                            [id number]-[id number] ([id number]) scope was introduced through the                            mouth, and advanced to the second part of duodenum.                            The upper GI endoscopy was accomplished without                            difficulty. The patient tolerated the procedure                            well. Scope In: 9:26:32 AM Scope Out: 9:32:42 AM Total Procedure Duration: 0 hours 6 minutes 10 seconds  Findings:      The examined esophagus was normal. Slightly dilated and J-shaped       stomach; otherwise gastric mucosa appeared normal. Pylorus patent.      The entire examined stomach was normal.      The duodenal bulb and second portion of the duodenum were normal. Impression:               - Normal esophagus.                           - Normal stomach (slightly dilated/J-shaped).                           - Normal duodenal bulb and second portion of the                            duodenum.                           - No specimens collected. Previously noted peptic                            ulcer disease and reflux esophagitis have healed. Moderate Sedation:      Moderate (  conscious) sedation was personally administered by an       anesthesia professional. The following parameters were monitored: oxygen       saturation, heart rate, blood pressure, respiratory rate, EKG, adequacy       of pulmonary ventilation, and response to care. Recommendation:           - Patient has a contact number available for                            emergencies. The signs and symptoms of potential                            delayed complications were discussed with the                            patient. Return to normal activities tomorrow.                            Written discharge instructions were provided to the                            patient.                           - Resume previous diet.                           - Continue present  medications.                           - Await pathology results.                           - Return to my office in 8 weeks. See colonoscopy                            report. Will see him back in the office in 8 weeks.                            For further evaluation of chronic abdominal pain;                            consider ischemic component. Procedure Code(s):        --- Professional ---                           [phone], Esophagogastroduodenoscopy, flexible,                            transoral; diagnostic, including collection of                            specimen(s) by brushing or washing, when performed                            (separate procedure) Diagnosis Code(s):        ---  Professional ---                           K27.9, Peptic ulcer, site unspecified, unspecified                            as acute or chronic, without hemorrhage or                            perforation CPT copyright 2019 American Medical Association. All rights reserved. The codes documented in this report are preliminary and upon coder review may  be revised to meet current compliance requirements. Cristopher Estimable. Azazel Franze, MD Norvel Richards, MD 04/16/2020 9:38:36 AM This report has been signed electronically. Number of Addenda: 0

## 2020-04-16 NOTE — Transfer of Care (Signed)
Immediate Anesthesia Transfer of Care Note  Patient: Bryan [name]  Procedure(s) Performed: COLONOSCOPY WITH PROPOFOL (N/A ) ESOPHAGOGASTRODUODENOSCOPY (EGD) WITH PROPOFOL (N/A )  Patient Location: PACU  Anesthesia Type:General  Level of Consciousness: sedated and patient cooperative  Airway & Oxygen Therapy: Patient Spontanous Breathing and Patient connected to nasal cannula oxygen  Post-op Assessment: Report given to RN and Post -op Vital signs reviewed and stable  Post vital signs: Reviewed and stable  Last Vitals:  Vitals Value Taken Time  BP 123/52 04/16/20 0940  Temp 97.7   Pulse 98 04/16/20 0942  Resp 33 04/16/20 0942  SpO2 98 % 04/16/20 0942  Vitals shown include unvalidated device data.  Last Pain:  Vitals:   04/16/20 0900  TempSrc:   PainSc: 0-No pain      Patients Stated Pain Goal: 8 (16/38/45 3646)  Complications: No complications documented.

## 2020-04-16 NOTE — Anesthesia Postprocedure Evaluation (Signed)
Anesthesia Post Note  Patient: Bryan [name]  Procedure(s) Performed: COLONOSCOPY WITH PROPOFOL (N/A ) ESOPHAGOGASTRODUODENOSCOPY (EGD) WITH PROPOFOL (N/A )  Patient location during evaluation: PACU Anesthesia Type: General Level of consciousness: awake and alert and patient cooperative Pain management: satisfactory to patient Vital Signs Assessment: post-procedure vital signs reviewed and stable Respiratory status: spontaneous breathing and patient connected to nasal cannula oxygen Cardiovascular status: stable Postop Assessment: no apparent nausea or vomiting Anesthetic complications: no   No complications documented.   Last Vitals:  Vitals:   04/16/20 1000 04/16/20 1013  BP: 116/74 123/75  Pulse: 95 89  Resp: (!) 33 (!) 23  Temp:    SpO2: 94% 93%    Last Pain:  Vitals:   04/16/20 1013  TempSrc:   PainSc: 0-No pain                 Kierre Deines

## 2020-04-16 NOTE — Anesthesia Procedure Notes (Signed)
Procedure Name: MAC Date/Time: 04/16/2020 8:59 AM Performed by: Vista Deck, CRNA Pre-anesthesia Checklist: Patient identified, Emergency Drugs available, Suction available, Timeout performed and Patient being monitored Patient Re-evaluated:Patient Re-evaluated prior to induction Oxygen Delivery Method: Non-rebreather mask

## 2020-04-16 NOTE — Patient Outreach (Signed)
[facility]) Care Management  04/16/2020  Bryan [name] 1956/03/04 [id number]   THNoutreach to complex care patient  Bryan [name] was initially referred to [facility] on 10/07/19 as an [facility] UM referral for assistance with finding an affordable housing.[facility] SW services has been provided. Bryan [name] needs to follow up on the [facility] SW resources provided to him. He continues to state he was not preferring apartment living, has completed the forms and returned them to the local housing department but has not routinely followed up.  Insurance:[employer] medicare  Since his referral to [facility] he has had  2 admissions 11/23/19 chest pain with diagnoses Non ST elevation myocardial infarction (NSTEMI), pulmonary arterial hypertension (moderate WHO Group 3), congestive Heart Failure (CHF)  & 02/12/20 COPD exacerbation  and 8 ED visits (COPD exacerbation x 2, colitis, epigastric pain x 3, back pain x 1 and chest pain x 2  He has been assisted by [facility] RN CM to get connected with the [place] pulmonology services for his COPD medical care - [name] seen on February 17 2020 His chronic back pain has been reviewed He has been encouraged to outreach to the ortho providers locally he has been seeing  He completed further evaluation of abnormal colon on CT and chronic abdominal pain with an EGD and diagnostic colonoscopy on 04/16/20 by Dr Linwood Dibbles  PMH of gastric ulcer, severe reflux erosive reflux esophagitis  Today [facility] RN CM outreached to Bryan [name] after his EGD and colonoscopy Patient is able to verify HIPAA ([place]) identifiers, date of birth (DOB) and address Reviewed and addressed the purpose of the follow up call with the patient  Consent: [facility] (Bryan [name]) RN CM reviewed [facility] services with patient. Patient gave verbal consent for services.  He confirms a polyp was found  He states he had a previous polyp and polyp was removed  He reports  resting in bed since the procedure He is to follow up with Dr Gala Romney on 06/02/20   Housing  He confirms he followed up with the apartment on [address] but the "man said he had lots of work to do to it"  Chronic obstructive pulmonary disease (COPD)  He denies worsening symptoms. No audible wheezing noted He reports his last respiratory treatment was completed on 04/15/20 He is not noted with follow visit with his pulmonologist and was encouraged to call to make his appointment to see [name] Last seen per Epic in June 2021   He states he was called by his primary care provider (PCP) RN recently but has not been seen in the last 2 months He reports he is scheduled to be seen in September 2021 (pcp is not on Epic server)   congestive Heart Failure (CHF) Epic indicates he is to go to cardiac rehab on 05/06/20  Social: Bryan [name] is a 64 year old disabled male patient who lives with his son receives assist from his son, niece, sisters and brother. He has 2 children and is independent with his care needs and transportation to medical appointments   Conditions: Colonic polyp 04/16/20, NSTEMI (non-ST elevated myocardial infarction) ([place]) (11/2019),Chronic obstructive pulmonary disease (COPD) . congestive Heart Failure (CHF), Hypertension (HTN), Diabetes (DM) type 2, BPH, Chronic Kidney disease (CKD)-III, colitis, hx stroke, former smoker (quit 11/11/2014), Acid reflux, Arthritis, Asthma, Chronic pain,  Headache, Pneumonia, Stroke (HCC) Tubular adenoma.    Plans [facility] RN CM will follow up with Bryan  [name] within the next 14-21 business days  Pt encouraged to return a call to [facility] RN CM prn Routed note to MD  Goals Addressed              This Visit's Progress     Patient Stated   .  patient will be able to manage his COPD, CHF, HTN, DM at home (pt-stated)        [place] (see longitudinal plan of care for additional care plan information)  Current Barriers:  Marland Kitchen Knowledge deficits  related to basic COPD, DM, HTN, CHF self care/management . Cognitive Deficits . Reports he needs better housing to assist with respiratory health (old home, roofing/heating concerns)   Case Manager Clinical Goal(s):  Over the next 60 days, patient will be able to verbalize understanding of COPD, CHF, DM, HTN action plan and when to seek appropriate levels of medical care  Over the next 90 days, patient will verbalize basic understanding of COPD disease process and self care activities   Interventions:   Provided patient with basic written and verbal COPD, DM, CHF, HTN education on self care/management/and exacerbation prevention   Provided patient with COPD, DM, CHF, HTN action plan and reinforced importance of daily self assessment  Provided instruction about proper use of medications used for management of COPD including inhalers  Advised patient to self assesses COPD, DM, CHF, HTN action plan zone and make appointment with provider if in the yellow zone for 48 hours without improvement.  Follow up  admissions and ED visits  Care coordination of appointment, DME  Encouraged him to follow up on housing resources  Patient Self Care Activities:  . Takes medications as prescribed including inhalers . Self assesses COPD, DM, CHF, HTN action plan zones and makes appointment with provider if in the yellow zone for 48 hours without improvement. . Does not adhere to prescribed medication regimen . Does not contact provider office for questions/concerns but goes to ED with worsening symptoms  Initial goal documentation Please see other previous [facility] care plan information listed in Epic under the flow sheet section          Ethelene Closser L. [name], RN, BSN, [place] Coordinator Office number [phone] Mobile number [phone]  Main [facility] number [phone] Fax number [phone]

## 2020-04-16 NOTE — Discharge Instructions (Addendum)
Colonoscopy Discharge Instructions  Read the instructions outlined below and refer to this sheet in the next few weeks. These discharge instructions provide you with general information on caring for yourself after you leave the hospital. Your doctor may also give you specific instructions. While your treatment has been planned according to the most current medical practices available, unavoidable complications occasionally occur. If you have any problems or questions after discharge, call Dr. Gala Romney at [phone]. ACTIVITY  You may resume your regular activity, but move at a slower pace for the next 24 hours.   Take frequent rest periods for the next 24 hours.   Walking will help get rid of the air and reduce the bloated feeling in your belly (abdomen).   No driving for 24 hours (because of the medicine (anesthesia) used during the test).    Do not sign any important legal documents or operate any machinery for 24 hours (because of the anesthesia used during the test).  NUTRITION  Drink plenty of fluids.   You may resume your normal diet as instructed by your doctor.   Begin with a light meal and progress to your normal diet. Heavy or fried foods are harder to digest and may make you feel sick to your stomach (nauseated).   Avoid alcoholic beverages for 24 hours or as instructed.  MEDICATIONS  You may resume your normal medications unless your doctor tells you otherwise.  WHAT YOU CAN EXPECT TODAY  Some feelings of bloating in the abdomen.   Passage of more gas than usual.   Spotting of blood in your stool or on the toilet paper.  IF YOU HAD POLYPS REMOVED DURING THE COLONOSCOPY:  No aspirin products for 7 days or as instructed.   No alcohol for 7 days or as instructed.   Eat a soft diet for the next 24 hours.  FINDING OUT THE RESULTS OF YOUR TEST Not all test results are available during your visit. If your test results are not back during the visit, make an appointment  with your caregiver to find out the results. Do not assume everything is normal if you have not heard from your caregiver or the medical facility. It is important for you to follow up on all of your test results.  SEEK IMMEDIATE MEDICAL ATTENTION IF:  You have more than a spotting of blood in your stool.   Your belly is swollen (abdominal distention).   You are nauseated or vomiting.   You have a temperature over 101.   You have abdominal pain or discomfort that is severe or gets worse throughout the day.  EGD Discharge instructions Please read the instructions outlined below and refer to this sheet in the next few weeks. These discharge instructions provide you with general information on caring for yourself after you leave the hospital. Your doctor may also give you specific instructions. While your treatment has been planned according to the most current medical practices available, unavoidable complications occasionally occur. If you have any problems or questions after discharge, please call your doctor. ACTIVITY  You may resume your regular activity but move at a slower pace for the next 24 hours.   Take frequent rest periods for the next 24 hours.   Walking will help expel (get rid of) the air and reduce the bloated feeling in your abdomen.   No driving for 24 hours (because of the anesthesia (medicine) used during the test).   You may shower.   Do not sign any important  legal documents or operate any machinery for 24 hours (because of the anesthesia used during the test).  NUTRITION  Drink plenty of fluids.   You may resume your normal diet.   Begin with a light meal and progress to your normal diet.   Avoid alcoholic beverages for 24 hours or as instructed by your caregiver.  MEDICATIONS  You may resume your normal medications unless your caregiver tells you otherwise.  WHAT YOU CAN EXPECT TODAY  You may experience abdominal discomfort such as a feeling of fullness  or gas pains.  FOLLOW-UP  Your doctor will discuss the results of your test with you.  SEEK IMMEDIATE MEDICAL ATTENTION IF ANY OF THE FOLLOWING OCCUR:  Excessive nausea (feeling sick to your stomach) and/or vomiting.   Severe abdominal pain and distention (swelling).   Trouble swallowing.   Temperature over 101 F (37.8 C).   Rectal bleeding or vomiting of blood.   Your ulcers have healed. The polyp in your colon removed.  No colitis found.  Further recommendations to follow pending review of pathology report  Office visit with [place] in 8 weeks  At patient request I called Sh[place] at [phone] -reviewed results   Monitored Anesthesia Care, Care After These instructions provide you with information about caring for yourself after your procedure. Your health care provider may also give you more specific instructions. Your treatment has been planned according to current medical practices, but problems sometimes occur. Call your health care provider if you have any problems or questions after your procedure. What can I expect after the procedure? After your procedure, you may:  Feel sleepy for several hours.  Feel clumsy and have poor balance for several hours.  Feel forgetful about what happened after the procedure.  Have poor judgment for several hours.  Feel nauseous or vomit.  Have a sore throat if you had a breathing tube during the procedure. Follow these instructions at home: For at least 24 hours after the procedure:      Have a responsible adult stay with you. It is important to have someone help care for you until you are awake and alert.  Rest as needed.  Do not: ? Participate in activities in which you could fall or become injured. ? Drive. ? Use heavy machinery. ? Drink alcohol. ? Take sleeping pills or medicines that cause drowsiness. ? Make important decisions or sign legal documents. ? Take care of children on your own. Eating and  drinking  Follow the diet that is recommended by your health care provider.  If you vomit, drink water, juice, or soup when you can drink without vomiting.  Make sure you have little or no nausea before eating solid foods. General instructions  Take over-the-counter and prescription medicines only as told by your health care provider.  If you have sleep apnea, surgery and certain medicines can increase your risk for breathing problems. Follow instructions from your health care provider about wearing your sleep device: ? Anytime you are sleeping, including during daytime naps. ? While taking prescription pain medicines, sleeping medicines, or medicines that make you drowsy.  If you smoke, do not smoke without supervision.  Keep all follow-up visits as told by your health care provider. This is important. Contact a health care provider if:  You keep feeling nauseous or you keep vomiting.  You feel light-headed.  You develop a rash.  You have a fever. Get help right away if:  You have trouble breathing. Summary  For several  hours after your procedure, you may feel sleepy and have poor judgment.  Have a responsible adult stay with you for at least 24 hours or until you are awake and alert. This information is not intended to replace advice given to you by your health care provider. Make sure you discuss any questions you have with your health care provider. Document Revised: 11/27/2017 Document Reviewed: 12/20/2015 Elsevier Patient Education  [place].

## 2020-04-16 NOTE — H&P (Signed)
_0 @   Primary Care Physician:  Rosita Fire, MD Primary Gastroenterologist:  Dr. Gala Romney  Pre-Procedure History & Physical: HPI:  Bryan [name] is a 64 y.o. male here for further evaluation of abnormal colon on CT and surveillance EGD history of severe reflux esophagitis and peptic ulcer disease open ([facility] and [facility]).  Chronic abdominal pain.  History of CAD.  Denies dysphagia.  Past Medical History:  Diagnosis Date  . Acid reflux   . Arthritis   . Asthma   . Chronic pain    with leg and back pain (disc problem)  . COPD (chronic obstructive pulmonary disease) ([place])   . Diabetes mellitus without complication ([place])   . Headache    HX OF  . History of upper GI x-ray series    to follow showed large duodenal ulcer H pylori serologies were negative  . Hypertension   . NSTEMI (non-ST elevated myocardial infarction) ([place]) 11/2019  . Pneumonia    07/17/17  . Pre-diabetes   . Stroke [facility])    TIA MINI STROKE  . Tubular adenoma     Past Surgical History:  Procedure Laterality Date  . BACK SURGERY  7/05; 5/09    Dr.Hirsch,3 lumbar  X3  . BACK SURGERY    . COLONOSCOPY  Feb 2012   Dr. Gala Romney: normal rectum, pedunculate polyp removed but not recovered  . COLONOSCOPY WITH ESOPHAGOGASTRODUODENOSCOPY (EGD) N/A 11/18/2013   Dr.Alannie Amodio- tcs= normal rectum, multipe polyps about the ileocecal valve and distal transverse colon o/w the remainder of the colonic mucosa appeared normal bx= tubular adenoma. EGD= normal esophagus, stomach with scattered erosions mottling, friablility, no ulcer or infiltrating process patent pylorus bx= chronic inflammation. next TCS 11/2016  . COLONOSCOPY WITH PROPOFOL N/A 05/25/2017   Dr. Gala Romney: sigmoid diverticulosis, one 4 mm hyerplastic rectal polyp, ascending colonic AVMs surveillance 2023  . ESOPHAGOGASTRODUODENOSCOPY  1/06   Dr. Volney American esophageal erosions,U-shaped stomach,marked erosions and edema of the bulb without discrete ulcer disease.   .  ESOPHAGOGASTRODUODENOSCOPY (EGD) WITH PROPOFOL N/A 05/25/2017   Dr. Gala Romney: reflux esophagitis s/p empiric dilation, normal stomach and duodenum  . ESOPHAGOGASTRODUODENOSCOPY (EGD) WITH PROPOFOL Left 11/30/2019   LA Grade D esophagitis, small hiatal hernia, one non-bleeding cratered gastric ulcer in prepyloric region of stomach, many non-bleeding cratered duodenal ulcers without stigmata of bleeding, mild luminal narrowing at apex of duodenal bulb but traversed easily. H.pylori serology negative.  Marland Kitchen MALONEY DILATION N/A 05/25/2017   Procedure: Venia Minks DILATION;  Surgeon: Daneil Dolin, MD;  Location: [facility];  Service: Endoscopy;  Laterality: N/A;  . NECK SURGERY  10/2007   PLATE IN NECK  . POLYPECTOMY  05/25/2017   Procedure: POLYPECTOMY;  Surgeon: Daneil Dolin, MD;  Location: [facility];  Service: Endoscopy;;  rectal  . RIGHT/LEFT HEART CATH AND CORONARY ANGIOGRAPHY N/A 12/03/2019   Procedure: RIGHT/LEFT HEART CATH AND CORONARY ANGIOGRAPHY;  Surgeon: Nigel Mormon, MD;  Location: [place] [facility];  Service: Cardiovascular;  Laterality: N/A;  . TRANSURETHRAL RESECTION OF PROSTATE N/A 08/17/2017   Procedure: TRANSURETHRAL RESECTION OF THE PROSTATE (TURP);  Surgeon: Irine Seal, MD;  Location: [facility];  Service: Urology;  Laterality: N/A;    Prior to Admission medications   Medication Sig Start Date End Date Taking? Authorizing Provider  acetaminophen (TYLENOL) 500 MG tablet Take 500 mg by mouth every 6 (six) hours as needed (pain.).    Yes [provider]  albuterol (PROVENTIL) (2.5 MG/3ML) 0.083% nebulizer solution Take 3 mLs (2.5 mg  total) by nebulization every 4 (four) hours as needed for wheezing or shortness of breath. 06/14/19  Yes Emokpae, Courage, MD  albuterol (VENTOLIN HFA) 108 (90 Base) MCG/ACT inhaler Inhale 2 puffs into the lungs every 4 (four) hours as needed for wheezing or shortness of breath. Do not use with nebulizer 06/14/19  Yes Emokpae, Courage, MD   calcium carbonate (TUMS - DOSED IN MG ELEMENTAL CALCIUM) 500 MG chewable tablet Chew 2 tablets (400 mg of elemental calcium total) by mouth 3 (three) times daily as needed for indigestion or heartburn. 12/04/19  Yes Amin, Ankit Chirag, MD  Fluticasone-Umeclidin-Vilant (TRELEGY ELLIPTA) 100-62.5-25 MCG/INH AEPB Inhale 1 puff into the lungs daily. 06/14/19  Yes Emokpae, Courage, MD  furosemide (LASIX) 40 MG tablet Take 1 tablet (40 mg total) by mouth daily. 02/15/20  Yes Tat, Shanon Brow, MD  gabapentin (NEURONTIN) 300 MG capsule Take 1 capsule (300 mg total) by mouth 2 (two) times daily. Pt. Says he is taking twice daily. Patient taking differently: Take 600 mg by mouth in the morning, at noon, and at bedtime.  06/14/19  Yes Emokpae, Courage, MD  guaiFENesin (MUCINEX) 600 MG 12 hr tablet Take 1 tablet (600 mg total) by mouth 2 (two) times daily. Patient taking differently: Take 600 mg by mouth daily.  06/14/19 06/13/20 Yes Roxan Hockey, MD  linaclotide (LINZESS) 72 MCG capsule Take 1 capsule (72 mcg total) by mouth daily before breakfast. Patient taking differently: Take 72 mcg by mouth daily as needed (constipation.).  06/14/19  Yes Emokpae, Courage, MD  magnesium oxide (MAG-OX) 400 MG tablet Take 400 mg by mouth daily.   Yes [provider]  methocarbamol (ROBAXIN) 500 MG tablet Take 1 tablet (500 mg total) by mouth every 8 (eight) hours as needed for muscle spasms. 04/01/20  Yes Maudie Flakes, MD  metoprolol tartrate (LOPRESSOR) 25 MG tablet Take 0.5 tablets (12.5 mg total) by mouth 2 (two) times daily. 12/04/19 03/20/21 Yes Amin, Jeanella Flattery, MD  OXYGEN Inhale 2.5 L into the lungs daily.   Yes [provider]  pantoprazole (PROTONIX) 40 MG tablet Take 1 tablet (40 mg total) by mouth 2 (two) times daily before a meal. Patient taking differently: Take 40 mg by mouth in the morning and at bedtime.  12/04/19 03/20/21 Yes Amin, Jeanella Flattery, MD  predniSONE (DELTASONE) 20 MG tablet Take 2 tablets  (40 mg total) by mouth daily with breakfast. 03/21/20  Yes Pollina, Gwenyth Allegra, MD  sucralfate (CARAFATE) 1 GM/10ML suspension Take 10 mLs (1 g total) by mouth 4 (four) times daily -  with meals and at bedtime. 04/08/20  Yes Nanavati, Ankit, MD  traMADol (ULTRAM) 50 MG tablet Take 1 tablet (50 mg total) by mouth every 6 (six) hours as needed. 03/30/20  Yes Virgel Manifold, MD  doxycycline (VIBRAMYCIN) 100 MG capsule Take 1 capsule (100 mg total) by mouth 2 (two) times daily. Patient not taking: Reported on 04/08/2020 03/21/20   Orpah Greek, MD  lidocaine (LIDODERM) 5 % Place 1 patch onto the skin daily. Remove & Discard patch within 12 hours or as directed by MD 04/01/20   Maudie Flakes, MD    Allergies as of 03/17/2020  . (No Known Allergies)    Family History  Problem Relation Age of Onset  . Cancer Father   . Asthma Mother   . Colon cancer Neg Hx     Social History   Socioeconomic History  . Marital status: Single    Spouse name: Not  on file  . Number of children: 2  . Years of education: 31  . Highest education level: High school graduate  Occupational History  . Occupation: disabled    Fish farm manager: UNEMPLOYED  Tobacco Use  . Smoking status: Former Smoker    Packs/day: 0.50    Years: 40.00    Pack years: 20.00    Types: Cigarettes    Quit date: 11/11/2014    Years since quitting: 5.4  . Smokeless tobacco: Never Used  Vaping Use  . Vaping Use: Never used  Substance and Sexual Activity  . Alcohol use: No    Alcohol/week: 0.0 standard drinks  . Drug use: Not Currently    Types: Marijuana    Comment: occas; denied 01/28/20  . Sexual activity: Not Currently    Birth control/protection: None  Other Topics Concern  . Not on file  Social History Narrative   Lives with girlfriend and son, is on disability for history of back injuries.   Social Determinants of Health   Financial Resource Strain: Medium Risk  . Difficulty of Paying Living Expenses: Somewhat hard   Food Insecurity: No Food Insecurity  . Worried About Charity fundraiser in the Last Year: Never true  . Ran Out of Food in the Last Year: Never true  Transportation Needs: No Transportation Needs  . Lack of Transportation (Medical): No  . Lack of Transportation (Non-Medical): No  Physical Activity: Inactive  . Days of Exercise per Week: 0 days  . Minutes of Exercise per Session: 0 min  Stress: No Stress Concern Present  . Feeling of Stress : Not at all  Social Connections: Socially Isolated  . Frequency of Communication with Friends and Family: More than three times a week  . Frequency of Social Gatherings with Friends and Family: More than three times a week  . Attends Religious Services: Never  . Active Member of Clubs or Organizations: No  . Attends Archivist Meetings: Never  . Marital Status: Never married  Intimate Partner Violence: Not At Risk  . Fear of Current or Ex-Partner: No  . Emotionally Abused: No  . Physically Abused: No  . Sexually Abused: No    Review of Systems: See HPI, otherwise negative ROS  Physical Exam: BP (!) 178/91   Pulse 80   Temp 98.1 F (36.7 C) (Oral)   Ht 5' 3" (1.6 m)   Wt 55.3 kg   SpO2 96% Comment: on 3 liters in short stay  BMI 21.60 kg/m  General:   Alert,  Well-developed, well-nourished, pleasant and cooperative in NAD Neck:  Supple; no masses or thyromegaly. No significant cervical adenopathy. Lungs:  Clear throughout to auscultation.   No wheezes, crackles, or rhonchi. No acute distress. Heart:  Regular rate and rhythm; no murmurs, clicks, rubs,  or gallops. Abdomen: Non-distended, normal bowel sounds.  Soft and nontender without appreciable mass or hepatosplenomegaly.  Pulses:  Normal pulses noted. Extremities:  Without clubbing or edema.  Impression/Plan: 64 year old gentleman with a history of gastric ulcer.  Here for surveillance EGD.  History of severe reflux erosive reflux esophagitis.  Abnormal colon on  CT  Patient is here for surveillance EGD and diagnostic colonoscopy per plan.  Discussed comorbidities with the anesthesia at length prior to procedure.   The risks, benefits, limitations, imponderables and alternatives regarding both EGD and colonoscopy have been reviewed with the patient. Questions have been answered. All parties agreeable.     Notice: This dictation was prepared with Dragon dictation  along with smaller phrase technology. Any transcriptional errors that result from this process are unintentional and may not be corrected upon review.

## 2020-04-17 ENCOUNTER — Encounter: Payer: Self-pay | Admitting: Internal Medicine

## 2020-04-17 LAB — SURGICAL PATHOLOGY

## 2020-04-19 ENCOUNTER — Emergency Department (HOSPITAL_COMMUNITY): Payer: Medicare Other

## 2020-04-19 ENCOUNTER — Encounter (HOSPITAL_COMMUNITY): Payer: Self-pay

## 2020-04-19 ENCOUNTER — Other Ambulatory Visit: Payer: Self-pay

## 2020-04-19 ENCOUNTER — Inpatient Hospital Stay (HOSPITAL_COMMUNITY)
Admission: EM | Admit: 2020-04-19 | Discharge: 2020-04-22 | DRG: 291 | Disposition: A | Discharge status: Home Health | Payer: Medicare Other | Attending: Family Medicine | Admitting: Family Medicine

## 2020-04-19 DIAGNOSIS — Z8719 Personal history of other diseases of the digestive system: Secondary | ICD-10-CM

## 2020-04-19 DIAGNOSIS — D509 Iron deficiency anemia, unspecified: Secondary | ICD-10-CM

## 2020-04-19 DIAGNOSIS — I509 Heart failure, unspecified: Secondary | ICD-10-CM

## 2020-04-19 DIAGNOSIS — K219 Gastro-esophageal reflux disease without esophagitis: Secondary | ICD-10-CM | POA: Diagnosis present

## 2020-04-19 DIAGNOSIS — N1831 Chronic kidney disease, stage 3a: Secondary | ICD-10-CM | POA: Diagnosis present

## 2020-04-19 DIAGNOSIS — Z7952 Long term (current) use of systemic steroids: Secondary | ICD-10-CM

## 2020-04-19 DIAGNOSIS — D631 Anemia in chronic kidney disease: Secondary | ICD-10-CM | POA: Diagnosis present

## 2020-04-19 DIAGNOSIS — N400 Enlarged prostate without lower urinary tract symptoms: Secondary | ICD-10-CM | POA: Diagnosis present

## 2020-04-19 DIAGNOSIS — I5033 Acute on chronic diastolic (congestive) heart failure: Secondary | ICD-10-CM | POA: Diagnosis not present

## 2020-04-19 DIAGNOSIS — G8929 Other chronic pain: Secondary | ICD-10-CM | POA: Diagnosis present

## 2020-04-19 DIAGNOSIS — K279 Peptic ulcer, site unspecified, unspecified as acute or chronic, without hemorrhage or perforation: Secondary | ICD-10-CM | POA: Diagnosis present

## 2020-04-19 DIAGNOSIS — M549 Dorsalgia, unspecified: Secondary | ICD-10-CM | POA: Diagnosis present

## 2020-04-19 DIAGNOSIS — Z8701 Personal history of pneumonia (recurrent): Secondary | ICD-10-CM

## 2020-04-19 DIAGNOSIS — Z20822 Contact with and (suspected) exposure to covid-19: Secondary | ICD-10-CM | POA: Diagnosis present

## 2020-04-19 DIAGNOSIS — E119 Type 2 diabetes mellitus without complications: Secondary | ICD-10-CM

## 2020-04-19 DIAGNOSIS — Z87891 Personal history of nicotine dependence: Secondary | ICD-10-CM

## 2020-04-19 DIAGNOSIS — J9691 Respiratory failure, unspecified with hypoxia: Secondary | ICD-10-CM

## 2020-04-19 DIAGNOSIS — Z9981 Dependence on supplemental oxygen: Secondary | ICD-10-CM

## 2020-04-19 DIAGNOSIS — E1122 Type 2 diabetes mellitus with diabetic chronic kidney disease: Secondary | ICD-10-CM | POA: Diagnosis present

## 2020-04-19 DIAGNOSIS — Z825 Family history of asthma and other chronic lower respiratory diseases: Secondary | ICD-10-CM

## 2020-04-19 DIAGNOSIS — Z86010 Personal history of colonic polyps: Secondary | ICD-10-CM

## 2020-04-19 DIAGNOSIS — Z79899 Other long term (current) drug therapy: Secondary | ICD-10-CM

## 2020-04-19 DIAGNOSIS — I130 Hypertensive heart and chronic kidney disease with heart failure and stage 1 through stage 4 chronic kidney disease, or unspecified chronic kidney disease: Secondary | ICD-10-CM | POA: Diagnosis not present

## 2020-04-19 DIAGNOSIS — J9621 Acute and chronic respiratory failure with hypoxia: Secondary | ICD-10-CM | POA: Diagnosis present

## 2020-04-19 DIAGNOSIS — Z9079 Acquired absence of other genital organ(s): Secondary | ICD-10-CM

## 2020-04-19 DIAGNOSIS — E782 Mixed hyperlipidemia: Secondary | ICD-10-CM | POA: Diagnosis present

## 2020-04-19 DIAGNOSIS — J441 Chronic obstructive pulmonary disease with (acute) exacerbation: Secondary | ICD-10-CM | POA: Diagnosis present

## 2020-04-19 DIAGNOSIS — N183 Chronic kidney disease, stage 3 unspecified: Secondary | ICD-10-CM | POA: Diagnosis present

## 2020-04-19 DIAGNOSIS — J449 Chronic obstructive pulmonary disease, unspecified: Secondary | ICD-10-CM | POA: Diagnosis present

## 2020-04-19 DIAGNOSIS — Z8673 Personal history of transient ischemic attack (TIA), and cerebral infarction without residual deficits: Secondary | ICD-10-CM

## 2020-04-19 DIAGNOSIS — M1990 Unspecified osteoarthritis, unspecified site: Secondary | ICD-10-CM | POA: Diagnosis present

## 2020-04-19 DIAGNOSIS — I252 Old myocardial infarction: Secondary | ICD-10-CM

## 2020-04-19 DIAGNOSIS — K277 Chronic peptic ulcer, site unspecified, without hemorrhage or perforation: Secondary | ICD-10-CM | POA: Diagnosis present

## 2020-04-19 LAB — CBC
HCT: 37.8 % — ABNORMAL LOW (ref 39.0–52.0)
Hemoglobin: 10.9 g/dL — ABNORMAL LOW (ref 13.0–17.0)
MCH: 24.1 pg — ABNORMAL LOW (ref 26.0–34.0)
MCHC: 28.8 g/dL — ABNORMAL LOW (ref 30.0–36.0)
MCV: 83.4 fL (ref 80.0–100.0)
Platelets: 515 10*3/uL — ABNORMAL HIGH (ref 150–400)
RBC: 4.53 MIL/uL (ref 4.22–5.81)
RDW: 19.1 % — ABNORMAL HIGH (ref 11.5–15.5)
WBC: 7.5 10*3/uL (ref 4.0–10.5)
nRBC: 0 % (ref 0.0–0.2)

## 2020-04-19 LAB — BASIC METABOLIC PANEL
Anion gap: 9 (ref 5–15)
BUN: 16 mg/dL (ref 8–23)
CO2: 30 mmol/L (ref 22–32)
Calcium: 8.6 mg/dL — ABNORMAL LOW (ref 8.9–10.3)
Chloride: 103 mmol/L (ref 98–111)
Creatinine, Ser: 1.56 mg/dL — ABNORMAL HIGH (ref 0.61–1.24)
GFR calc Af Amer: 54 mL/min — ABNORMAL LOW (ref >60)
GFR calc non Af Amer: 47 mL/min — ABNORMAL LOW (ref >60)
Glucose, Bld: 106 mg/dL — ABNORMAL HIGH (ref 70–99)
Potassium: 4.6 mmol/L (ref 3.5–5.1)
Sodium: 142 mmol/L (ref 135–145)

## 2020-04-19 LAB — TROPONIN I (HIGH SENSITIVITY)
Troponin I (High Sensitivity): 15 ng/L (ref <18)
Troponin I (High Sensitivity): 15 ng/L (ref <18)

## 2020-04-19 LAB — BRAIN NATRIURETIC PEPTIDE: B Natriuretic Peptide: 987 pg/mL — ABNORMAL HIGH (ref 0.0–100.0)

## 2020-04-19 LAB — SARS CORONAVIRUS 2 BY RT PCR (HOSPITAL ORDER, PERFORMED IN ~~LOC~~ HOSPITAL LAB): SARS Coronavirus 2: NEGATIVE

## 2020-04-19 MED ORDER — ONDANSETRON HCL 4 MG/2ML IJ SOLN: 4.0000 mg | Freq: Four times a day (QID) | INTRAMUSCULAR | Status: DC | PRN

## 2020-04-19 MED ORDER — METHOCARBAMOL 500 MG PO TABS: 500.0000 mg | ORAL_TABLET | Freq: Three times a day (TID) | ORAL | Status: DC | PRN

## 2020-04-19 MED ORDER — SODIUM CHLORIDE 0.9 % IV SOLN: 250.0000 mL | INTRAVENOUS | Status: DC | PRN

## 2020-04-19 MED ORDER — FUROSEMIDE 10 MG/ML IJ SOLN
40.0000 mg | Freq: Two times a day (BID) | INTRAMUSCULAR | Status: DC
  Administered 2020-04-20 – 2020-04-22 (×5): 40 mg via INTRAVENOUS
  Filled 2020-04-19 (×5): qty 4

## 2020-04-19 MED ORDER — SODIUM CHLORIDE 0.9% FLUSH
3.0000 mL | Freq: Two times a day (BID) | INTRAVENOUS | Status: DC
  Administered 2020-04-20 – 2020-04-22 (×5): 3 mL via INTRAVENOUS

## 2020-04-19 MED ORDER — AEROCHAMBER PLUS FLO-VU MISC
1.0000 | Freq: Once | Status: DC
  Filled 2020-04-19: qty 1

## 2020-04-19 MED ORDER — ALBUTEROL SULFATE (2.5 MG/3ML) 0.083% IN NEBU: 2.5000 mg | INHALATION_SOLUTION | RESPIRATORY_TRACT | Status: DC | PRN

## 2020-04-19 MED ORDER — FUROSEMIDE 10 MG/ML IJ SOLN
60.0000 mg | Freq: Once | INTRAMUSCULAR | Status: AC
  Administered 2020-04-19: 60 mg via INTRAVENOUS
  Filled 2020-04-19: qty 6

## 2020-04-19 MED ORDER — SODIUM CHLORIDE 0.9% FLUSH: 3.0000 mL | INTRAVENOUS | Status: DC | PRN

## 2020-04-19 MED ORDER — ALBUTEROL SULFATE HFA 108 (90 BASE) MCG/ACT IN AERS
4.0000 | INHALATION_SPRAY | Freq: Once | RESPIRATORY_TRACT | Status: AC
  Administered 2020-04-19: 4 via RESPIRATORY_TRACT
  Filled 2020-04-19: qty 6.7

## 2020-04-19 MED ORDER — FLUTICASONE-UMECLIDIN-VILANT 100-62.5-25 MCG/INH IN AEPB: 1.0000 | INHALATION_SPRAY | Freq: Every day | RESPIRATORY_TRACT | Status: DC

## 2020-04-19 MED ORDER — SUCRALFATE 1 GM/10ML PO SUSP
1.0000 g | Freq: Three times a day (TID) | ORAL | Status: DC
  Administered 2020-04-20 – 2020-04-22 (×10): 1 g via ORAL
  Filled 2020-04-19 (×11): qty 10

## 2020-04-19 MED ORDER — SODIUM CHLORIDE 0.9% FLUSH: 3.0000 mL | Freq: Once | INTRAVENOUS | Status: DC

## 2020-04-19 MED ORDER — ACETAMINOPHEN 325 MG PO TABS: 650.0000 mg | ORAL_TABLET | Freq: Four times a day (QID) | ORAL | Status: DC | PRN

## 2020-04-19 MED ORDER — GABAPENTIN 300 MG PO CAPS
300.0000 mg | ORAL_CAPSULE | Freq: Two times a day (BID) | ORAL | Status: DC
  Administered 2020-04-19 – 2020-04-22 (×6): 300 mg via ORAL
  Filled 2020-04-19 (×6): qty 1

## 2020-04-19 MED ORDER — IOHEXOL 350 MG/ML SOLN
80.0000 mL | Freq: Once | INTRAVENOUS | Status: AC | PRN
  Administered 2020-04-19: 80 mL via INTRAVENOUS

## 2020-04-19 MED ORDER — TRAMADOL HCL 50 MG PO TABS
50.0000 mg | ORAL_TABLET | Freq: Four times a day (QID) | ORAL | Status: DC | PRN
  Administered 2020-04-20: 50 mg via ORAL
  Filled 2020-04-19: qty 1

## 2020-04-19 MED ORDER — ONDANSETRON HCL 4 MG PO TABS: 4.0000 mg | ORAL_TABLET | Freq: Four times a day (QID) | ORAL | Status: DC | PRN

## 2020-04-19 MED ORDER — ACETAMINOPHEN 650 MG RE SUPP: 650.0000 mg | Freq: Four times a day (QID) | RECTAL | Status: DC | PRN

## 2020-04-19 MED ORDER — GUAIFENESIN ER 600 MG PO TB12
600.0000 mg | ORAL_TABLET | Freq: Two times a day (BID) | ORAL | Status: DC
  Administered 2020-04-19 – 2020-04-22 (×6): 600 mg via ORAL
  Filled 2020-04-19 (×6): qty 1

## 2020-04-19 MED ORDER — PANTOPRAZOLE SODIUM 40 MG PO TBEC
40.0000 mg | DELAYED_RELEASE_TABLET | Freq: Two times a day (BID) | ORAL | Status: DC
  Administered 2020-04-19 – 2020-04-22 (×6): 40 mg via ORAL
  Filled 2020-04-19 (×6): qty 1

## 2020-04-19 MED ORDER — MAGNESIUM OXIDE 400 (241.3 MG) MG PO TABS: 400.0000 mg | ORAL_TABLET | Freq: Every day | ORAL | Status: DC

## 2020-04-19 MED ORDER — LIDOCAINE 5 % EX PTCH
1.0000 | MEDICATED_PATCH | CUTANEOUS | Status: DC
  Administered 2020-04-20: 1 via TRANSDERMAL
  Filled 2020-04-19 (×2): qty 1

## 2020-04-19 NOTE — ED Notes (Signed)
Pt 87% on 2.5l [place]. Pt placed on 3.5l.

## 2020-04-19 NOTE — ED Triage Notes (Signed)
Pt to er, pt c/o general body aches, pt states that he also has chest pain and it hurts to breath, pt is on 2.5 L via [place], states that this is his normal O2 at home.

## 2020-04-19 NOTE — ED Provider Notes (Signed)
[facility] EMERGENCY DEPARTMENT Provider Note   CSN: [id number] Arrival date & time: 04/19/20  1314     History Chief Complaint  Patient presents with  . Chest Pain    Bryan [name] is a 64 y.o. male history of COPD, diabetes, NSTEMI, CVA, CHF, hyperlipidemia, CKD.  Patient presents today for chest pain shortness of breath onset 1 week ago.  Symptoms have been gradually worsening and he describes a generalized chest pain pressure constant worsened with deep breathing improved with rest.  He reports shortness of breath if he attempts to take a deep breath.  He reports increasing exertional dyspnea and reports he is unable to take more than a few steps around his home without having to stop and rest.  Additionally patient reports generalized body aches onset 2 days ago, no known Covid exposures.  Associated symptom of bilateral lower extremity edema which is chronic.  Denies fall/injury, fever/chills, cough, hemoptysis, abdominal pain, nausea/vomiting, diarrhea, dysuria/hematuria, decreased urination, medication noncompliance or any additional concerns.  HPI     Past Medical History:  Diagnosis Date  . Acid reflux   . Arthritis   . Asthma   . Chronic pain    with leg and back pain (disc problem)  . COPD (chronic obstructive pulmonary disease) ([place])   . Diabetes mellitus without complication ([place])   . Headache    HX OF  . History of upper GI x-ray series    to follow showed large duodenal ulcer H pylori serologies were negative  . Hypertension   . NSTEMI (non-ST elevated myocardial infarction) ([place]) 11/2019  . Pneumonia    07/17/17  . Pre-diabetes   . Stroke [facility])    TIA MINI STROKE  . Tubular adenoma     Patient Active Problem List   Diagnosis Date Noted  . Acute on chronic respiratory failure with hypoxia ([place]) 02/14/2020  . Diverticulitis of colon   . Abnormal CT scan, colon   . RUQ pain   . Acute on chronic diastolic CHF (congestive heart failure) ([place]) 02/12/2020    . PUD (peptic ulcer disease) 01/28/2020  . Nonobstructive atherosclerosis of coronary artery   . Pulmonary hypertension due to COPD ([place])   . NSTEMI (non-ST elevated myocardial infarction) ([place]) 11/25/2019  . Non-insulin dependent type 2 diabetes mellitus ([name])   . Mixed hyperlipidemia   . Non-ST elevation (NSTEMI) myocardial infarction ([place]) 11/24/2019  . Oxygen dependent 11/24/2019  . Former smoker 11/24/2019  . Coronary artery calcification seen on CAT scan   . Angina pectoris ([place]) 11/23/2019  . SOB (shortness of breath) 11/23/2019  . Chronic diastolic CHF (congestive heart failure) ([place]) 08/03/2019  . Acute on chronic respiratory failure ([place]) 08/02/2019  . Acute exacerbation of CHF (congestive heart failure) /HFpEF (EF 60%) 06/13/2019  . Normocytic anemia 02/15/2019  . COPD exacerbation ([place]) 09/16/2018  . Hypokalemia 06/26/2018  . Hypomagnesemia 06/26/2018  . Anemia 06/26/2018  . BPH (benign prostatic hyperplasia) 06/26/2018  . Chronic respiratory failure with hypoxia ([place]) 06/04/2018  . HCAP (healthcare-associated pneumonia) 06/04/2018  . Acute respiratory failure with hypoxia ([place]) 04/14/2018  . Pulmonary nodule 04/14/2018  . CKD (chronic kidney disease) stage 3, GFR 30-59 ml/min 04/14/2018  . Type 2 diabetes mellitus with hyperlipidemia ([place]) 03/12/2018  . BPH with obstruction/lower urinary tract symptoms 08/17/2017  . Left sided numbness 07/22/2017  . TIA (transient ischemic attack) 07/22/2017  . Pneumonia 07/14/2017  . COPD with acute exacerbation ([place]) 07/14/2017  . Constipation 07/10/2017  . Hx of adenomatous  colonic polyps 05/01/2017  . Benign hypertension with CKD (chronic kidney disease) stage III   . Chronic pain   . Heme positive stool 10/20/2014  . Epigastric pain 06/19/2014  . Dysphagia 06/19/2014  . AP (abdominal pain) 11/13/2013  . Early satiety 11/13/2013  . COPD (chronic obstructive pulmonary disease) ([place]) 02/22/2013  . Low back pain 11/19/2012   . COLITIS 05/12/2009  . ABDOMINAL PAIN, LEFT LOWER QUADRANT 05/12/2009  . DUODENAL ULCER, HX OF 05/12/2009  . H/o Alcohol abuse- Quit in 2010 05/11/2009  . GERD 05/11/2009  . Diarrhea 05/11/2009  . SPONDYLOSIS, CERVICAL 07/03/2007  . NECK PAIN 07/03/2007    Past Surgical History:  Procedure Laterality Date  . BACK SURGERY  7/05; 5/09    Dr.Hirsch,3 lumbar  X3  . BACK SURGERY    . COLONOSCOPY  Feb 2012   Dr. Gala Romney: normal rectum, pedunculate polyp removed but not recovered  . COLONOSCOPY WITH ESOPHAGOGASTRODUODENOSCOPY (EGD) N/A 11/18/2013   Dr.Rourk- tcs= normal rectum, multipe polyps about the ileocecal valve and distal transverse colon o/w the remainder of the colonic mucosa appeared normal bx= tubular adenoma. EGD= normal esophagus, stomach with scattered erosions mottling, friablility, no ulcer or infiltrating process patent pylorus bx= chronic inflammation. next TCS 11/2016  . COLONOSCOPY WITH PROPOFOL N/A 05/25/2017   Dr. Gala Romney: sigmoid diverticulosis, one 4 mm hyerplastic rectal polyp, ascending colonic AVMs surveillance 2023  . ESOPHAGOGASTRODUODENOSCOPY  1/06   Dr. Volney American esophageal erosions,U-shaped stomach,marked erosions and edema of the bulb without discrete ulcer disease.   . ESOPHAGOGASTRODUODENOSCOPY (EGD) WITH PROPOFOL N/A 05/25/2017   Dr. Gala Romney: reflux esophagitis s/p empiric dilation, normal stomach and duodenum  . ESOPHAGOGASTRODUODENOSCOPY (EGD) WITH PROPOFOL Left 11/30/2019   LA Grade D esophagitis, small hiatal hernia, one non-bleeding cratered gastric ulcer in prepyloric region of stomach, many non-bleeding cratered duodenal ulcers without stigmata of bleeding, mild luminal narrowing at apex of duodenal bulb but traversed easily. H.pylori serology negative.  Marland Kitchen MALONEY DILATION N/A 05/25/2017   Procedure: Venia Minks DILATION;  Surgeon: Daneil Dolin, MD;  Location: [facility] ENDO SU[facility];  Service: Endoscopy;  Laterality: N/A;  . NECK SURGERY  10/2007   PLATE IN NECK  .  POLYPECTOMY  05/25/2017   Procedure: POLYPECTOMY;  Surgeon: Daneil Dolin, MD;  Location: [facility] ENDO SU[facility];  Service: Endoscopy;;  rectal  . RIGHT/LEFT HEART CATH AND CORONARY ANGIOGRAPHY N/A 12/03/2019   Procedure: RIGHT/LEFT HEART CATH AND CORONARY ANGIOGRAPHY;  Surgeon: Nigel Mormon, MD;  Location: [facility] LAB;  Service: Cardiovascular;  Laterality: N/A;  . TRANSURETHRAL RESECTION OF PROSTATE N/A 08/17/2017   Procedure: TRANSURETHRAL RESECTION OF THE PROSTATE (TURP);  Surgeon: Irine Seal, MD;  Location: [facility] ORS;  Service: Urology;  Laterality: N/A;       Family History  Problem Relation Age of Onset  . Cancer Father   . Asthma Mother   . Colon cancer Neg Hx     Social History   Tobacco Use  . Smoking status: Former Smoker    Packs/day: 0.50    Years: 40.00    Pack years: 20.00    Types: Cigarettes    Quit date: 11/11/2014    Years since quitting: 5.4  . Smokeless tobacco: Never Used  Vaping Use  . Vaping Use: Never used  Substance Use Topics  . Alcohol use: No    Alcohol/week: 0.0 standard drinks  . Drug use: Not Currently    Types: Marijuana    Comment: occas; denied 01/28/20    Home Medications  Prior to Admission medications   Medication Sig Start Date End Date Taking? Authorizing Provider  acetaminophen (TYLENOL) 500 MG tablet Take 500 mg by mouth every 6 (six) hours as needed (pain.).    Yes [provider]  albuterol (PROVENTIL) (2.5 MG/3ML) 0.083% nebulizer solution Take 3 mLs (2.5 mg total) by nebulization every 4 (four) hours as needed for wheezing or shortness of breath. 06/14/19  Yes Emokpae, Courage, MD  albuterol (VENTOLIN HFA) 108 (90 Base) MCG/ACT inhaler Inhale 2 puffs into the lungs every 4 (four) hours as needed for wheezing or shortness of breath. Do not use with nebulizer 06/14/19  Yes Emokpae, Courage, MD  calcium carbonate (TUMS - DOSED IN MG ELEMENTAL CALCIUM) 500 MG chewable tablet Chew 2 tablets (400 mg of elemental calcium total)  by mouth 3 (three) times daily as needed for indigestion or heartburn. 12/04/19  Yes Amin, Ankit Chirag, MD  Fluticasone-Umeclidin-Vilant (TRELEGY ELLIPTA) 100-62.5-25 MCG/INH AEPB Inhale 1 puff into the lungs daily. 06/14/19  Yes Emokpae, Courage, MD  furosemide (LASIX) 40 MG tablet Take 1 tablet (40 mg total) by mouth daily. 02/15/20  Yes Tat, Shanon Brow, MD  gabapentin (NEURONTIN) 300 MG capsule Take 1 capsule (300 mg total) by mouth 2 (two) times daily. Pt. Says he is taking twice daily. Patient taking differently: Take 600 mg by mouth in the morning, at noon, and at bedtime.  06/14/19  Yes Emokpae, Courage, MD  ibuprofen (ADVIL) 800 MG tablet Take 800 mg by mouth daily as needed for pain. 04/09/20  Yes [provider]  lidocaine (LIDODERM) 5 % Place 1 patch onto the skin daily. Remove & Discard patch within 12 hours or as directed by MD 04/01/20  Yes Maudie Flakes, MD  linaclotide [facility]) 72 MCG capsule Take 1 capsule (72 mcg total) by mouth daily before breakfast. Patient taking differently: Take 72 mcg by mouth daily as needed (constipation.).  06/14/19  Yes Emokpae, Courage, MD  magnesium oxide (MAG-OX) 400 MG tablet Take 400 mg by mouth daily.   Yes [provider]  methocarbamol (ROBAXIN) 500 MG tablet Take 1 tablet (500 mg total) by mouth every 8 (eight) hours as needed for muscle spasms. 04/01/20  Yes Maudie Flakes, MD  metoprolol tartrate (LOPRESSOR) 25 MG tablet Take 0.5 tablets (12.5 mg total) by mouth 2 (two) times daily. 12/04/19 03/20/21 Yes Amin, Jeanella Flattery, MD  OXYGEN Inhale 2.5 L into the lungs daily.   Yes [provider]  pantoprazole (PROTONIX) 40 MG tablet Take 1 tablet (40 mg total) by mouth 2 (two) times daily before a meal. Patient taking differently: Take 40 mg by mouth in the morning and at bedtime.  12/04/19 03/20/21 Yes Amin, Jeanella Flattery, MD  predniSONE (DELTASONE) 20 MG tablet Take 2 tablets (40 mg total) by mouth daily with breakfast. 03/21/20  Yes  Pollina, Gwenyth Allegra, MD  sucralfate (CARAFATE) 1 GM/10ML suspension Take 10 mLs (1 g total) by mouth 4 (four) times daily -  with meals and at bedtime. 04/08/20  Yes Nanavati, Ankit, MD  traMADol (ULTRAM) 50 MG tablet Take 1 tablet (50 mg total) by mouth every 6 (six) hours as needed. 03/30/20  Yes Virgel Manifold, MD  guaiFENesin (MUCINEX) 600 MG 12 hr tablet Take 1 tablet (600 mg total) by mouth 2 (two) times daily. Patient taking differently: Take 600 mg by mouth daily.  06/14/19 06/13/20  Roxan Hockey, MD    Allergies    Patient has no known allergies.  Review of Systems  Review of Systems Ten systems are reviewed and are negative for acute change except as noted in the HPI  Physical Exam Updated Vital Signs BP (!) 166/89   Pulse 88   Temp 98.8 F (37.1 C) (Oral)   Resp (!) 24   Ht _0  (1.6 m)   Wt 56.7 kg   SpO2 93%   BMI 22.14 kg/m   Physical Exam Constitutional:      General: He is not in acute distress.    Appearance: Normal appearance. He is well-developed. He is not ill-appearing or diaphoretic.  HENT:     Head: Normocephalic and atraumatic.  Eyes:     General: Vision grossly intact. Gaze aligned appropriately.     Pupils: Pupils are equal, round, and reactive to light.  Neck:     Trachea: Trachea and phonation normal.  Cardiovascular:     Rate and Rhythm: Normal rate and regular rhythm.     Pulses:          Dorsalis pedis pulses are 1+ on the right side and 1+ on the left side.  Pulmonary:     Effort: Pulmonary effort is normal. No respiratory distress.     Breath sounds: Rales present.  Abdominal:     General: There is no distension.     Palpations: Abdomen is soft.     Tenderness: There is no abdominal tenderness. There is no guarding or rebound.  Musculoskeletal:        General: Normal range of motion.     Cervical back: Normal range of motion.     Right lower leg: 2+ Edema present.     Left lower leg: 2+ Edema present.  Skin:    General: Skin  is warm and dry.  Neurological:     Mental Status: He is alert.     GCS: GCS eye subscore is 4. GCS verbal subscore is 5. GCS motor subscore is 6.     Comments: Speech is clear and goal oriented, follows commands Major Cranial nerves without deficit, no facial droop Moves extremities without ataxia, coordination intact  Psychiatric:        Behavior: Behavior normal.     ED Results / Procedures / Treatments   Labs (all labs ordered are listed, but only abnormal results are displayed) Labs Reviewed  BASIC METABOLIC PANEL - Abnormal; Notable for the following components:      Result Value   Glucose, Bld 106 (*)    Creatinine, Ser 1.56 (*)    Calcium 8.6 (*)    GFR calc non Af Amer 47 (*)    GFR calc Af Amer 54 (*)    All other components within normal limits  CBC - Abnormal; Notable for the following components:   Hemoglobin 10.9 (*)    HCT 37.8 (*)    MCH 24.1 (*)    MCHC 28.8 (*)    RDW 19.1 (*)    Platelets 515 (*)    All other components within normal limits  BRAIN NATRIURETIC PEPTIDE - Abnormal; Notable for the following components:   B Natriuretic Peptide 987.0 (*)    All other components within normal limits  SARS CORONAVIRUS 2 BY RT PCR (HOSPITAL ORDER, [place] [facility])  TROPONIN I (HIGH SENSITIVITY)  TROPONIN I (HIGH SENSITIVITY)    EKG EKG Interpretation  Date/Time:  Sunday April 19 2020 13:32:01 EDT Ventricular Rate:  82 PR Interval:  118 QRS Duration: 72 QT Interval:  372 QTC Calculation: 434  R Axis:   99 Text Interpretation: Normal sinus rhythm Rightward axis Borderline ECG No STEMI Confirmed by Octaviano Glow [phone]) on 04/19/2020 6:04:27 PM   Radiology DG Chest 2 View  Result Date: 04/19/2020 CLINICAL DATA:  Chest pain EXAM: CHEST - 2 VIEW COMPARISON:  04/01/2020 chest radiograph. FINDINGS: Partially visualized surgical hardware from ACDF. Stable cardiomediastinal silhouette with normal heart size. No pneumothorax. Chronic  blunting of the costophrenic angles bilaterally. Hyperinflated lungs. No pulmonary edema. No acute consolidative airspace disease. IMPRESSION: 1. Hyperinflated lungs, suggesting COPD. 2. No acute cardiopulmonary disease. Electronically Signed   By: Ilona Sorrel M.D.   On: 04/19/2020 14:11   CT Angio Chest PE W and/or Wo Contrast  Result Date: 04/19/2020 CLINICAL DATA:  Short of breath, RCA or pleural effusion suspected. EXAM: CT ANGIOGRAPHY CHEST WITH CONTRAST TECHNIQUE: Multidetector CT imaging of the chest was performed using the standard protocol during bolus administration of intravenous contrast. Multiplanar CT image reconstructions and MIPs were obtained to evaluate the vascular anatomy. CONTRAST:  82m OMNIPAQUE IOHEXOL 350 MG/ML SOLN COMPARISON:  None. FINDINGS: Cardiovascular: No filling defects within the pulmonary arteries to suggest acute pulmonary embolism. No acute findings aorta great vessels. No pericardial fluid. Mediastinum/Nodes: No axillary or supraclavicular adenopathy. No mediastinal adenopathy. No pericardial effusion. Esophagus normal. Borderline enlarged RIGHT perihilar lymph nodes measure up to 9 mm (image 57/4). No changed from comparison exam. Lungs/Pleura: No pulmonary infarction. No pneumonia. No pleural fluid. Interlobular septal thickening noted. Small band of atelectasis in the RIGHT lower lobe on image 86/8. Upper Abdomen: Limited view of the liver, kidneys, pancreas are unremarkable. Normal adrenal glands. Musculoskeletal: No aggressive osseous lesion. Review of the MIP images confirms the above findings. IMPRESSION: 1. No evidence acute pulmonary embolism. 2. Interlobular septal thickening suggests interstitial edema. 3. No pulmonary infarction, pneumonia or pleural fluid. 4. Small focus of atelectasis or infection RIGHT lower lobe. Electronically Signed   By: SSuzy BouchardM.D.   On: 04/19/2020 20:27    Procedures .Critical Care Performed by: MDeliah Boston  PA-C Authorized by: MDeliah Boston PA-C   Critical care provider statement:    Critical care time (minutes):  35   Critical care was necessary to treat or prevent imminent or life-threatening deterioration of the following conditions:  Respiratory failure   Critical care was time spent personally by me on the following activities:  Discussions with consultants, evaluation of patient's response to treatment, examination of patient, ordering and performing treatments and interventions, ordering and review of laboratory studies, ordering and review of radiographic studies, pulse oximetry, re-evaluation of patient's condition, obtaining history from patient or surrogate, review of old charts and development of treatment plan with patient or surrogate   (including critical care time)  Medications Ordered in ED Medications  sodium chloride flush (NS) 0.9 % injection 3 mL (3 mLs Intravenous Not Given 04/19/20 1933)  aerochamber plus with mask device 1 each (1 each Other Not Given 04/19/20 1944)  albuterol (VENTOLIN HFA) 108 (90 Base) MCG/ACT inhaler 4 puff (4 puffs Inhalation Given 04/19/20 1942)  iohexol (OMNIPAQUE) 350 MG/ML injection 80 mL (80 mLs Intravenous Contrast Given 04/19/20 1943)  furosemide (LASIX) injection 60 mg (60 mg Intravenous Given 04/19/20 2049)    ED Course  I have reviewed the triage vital signs and the nursing notes.  Pertinent labs & imaging results that were available during my care of the patient were reviewed by me and considered in my medical decision making (see chart for details).  Clinical  Course as of Apr 19 2204  Nancy Fetter Apr 19, 2020  2030 IMPRESSION: 1. No evidence acute pulmonary embolism. 2. Interlobular septal thickening suggests interstitial edema. 3. No pulmonary infarction, pneumonia or pleural fluid. 4. Small focus of atelectasis or infection RIGHT lower lobe.   [MT]    Clinical Course User Index [MT] Trifan, Carola Rhine, MD   MDM Rules/Calculators/A&P                           Additional history obtained from: 1. Nursing notes from this visit. 2. Review of EMR. ----------------------------- I ordered, reviewed and interpreted labs which include: Initial and delta troponin of 15, no elevation. BMP shows baseline kidney function, no emergent electrolyte derangement or gap. CBC shows no leukocytosis to suggest infection, anemia of 10.9 appears baseline. BNP elevated at 987. Covid test negative.  CXR:  IMPRESSION:  1. Hyperinflated lungs, suggesting COPD.  2. No acute cardiopulmonary disease.   CT Angio PE Study:  IMPRESSION:  1. No evidence acute pulmonary embolism.  2. Interlobular septal thickening suggests interstitial edema.  3. No pulmonary infarction, pneumonia or pleural fluid.  4. Small focus of atelectasis or infection RIGHT lower lobe.   EKG: Normal sinus rhythm Rightward axis Borderline ECG No STEMI Confirmed by Octaviano Glow [phone]) on 04/19/2020 6:04:27 PM ------------------------- Work-up today is consistent with CHF exacerbation.  Patient was given 60 mg IV Lasix and attempted diuresis to discharge.  Unfortunately patient's SPO2 dropped into the 80s on his normal home O2 when he had to be increased.  Patient reassessed he is in no acute distress and is agreeable for admission.  Consult placed to hospitalist service. - 10:05 PM: Discussed case with Dr. Olevia Bowens, patient has been accepted to hospitalist service.   Bryan [name] was evaluated in Emergency Department on 04/19/2020 for the symptoms described in the history of present illness. He was evaluated in the context of the global COVID-19 pandemic, which necessitated consideration that the patient might be at risk for infection with the SARS-CoV-2 virus that causes COVID-19. Institutional protocols and algorithms that pertain to the evaluation of patients at risk for COVID-19 are in a state of rapid change based on information released by regulatory bodies including the CDC and  federal and state organizations. These policies and algorithms were followed during the patient's care in the ED.  Note: Portions of this report may have been transcribed using voice recognition software. Every effort was made to ensure accuracy; however, inadvertent computerized transcription errors may still be present. Final Clinical Impression(s) / ED Diagnoses Final diagnoses:  Respiratory failure with hypoxia, unspecified chronicity ([place])  Acute on chronic congestive heart failure, unspecified heart failure type [facility])    Rx / DC Orders ED Discharge Orders    None       Gari Crown 04/19/20 2205    Wyvonnia Dusky, MD 04/20/20 [phone]

## 2020-04-19 NOTE — H&P (Signed)
History and Physical    Bryan [name] UUV:[id number] DOB: [birth year] DOA: 04/19/2020  PCP: Rosita Fire, MD   Patient coming from: Home.  I have personally briefly reviewed patient's old medical records in [place]  Chief Complaint: Shortness of breath.  HPI: Bryan [name] is a 64 y.o. male with medical history significant of acid reflux, arthritis, asthma, chronic back pain, COPD, type 2 diabetes, hypertension, history of pneumonia, history of other nonhemorrhagic CVA/TIA, tubular adenoma chronic diastolic CHF who is coming to the emergency department with complaints of progressively worse dyspnea since yesterday evening associated with pleuritic chest pain, wheezing, fatigue, but denies productive or nonproductive cough.  No fever, chills, hemoptysis, sore throat or rhinorrhea.  No sick contacts travel history.  No anginal CP, palpitations, diaphoresis, PND, orthopnea or pitting edema of the lower extremities.  No nausea, emesis, diarrhea, constipation, melena or hematochezia, dysuria, hematuria, polydipsia, polyphagia or polyuria.  The patient has been eating foods with a high content of sodium lately.  ED Course: Initial vital signs were temperature 98.8 F, pulse 81, respirations 24, blood pressure 128/88 mmHg and O2 sat 90% on 3 L nasal cannula.  The patient received 60 mg of furosemide and 4 puffs of albuterol MDI.  I added levalbuterol, Solu-Medrol 125 mg IVP x1, morphine 4 mg IVP x1 and magnesium sulfate 2 g IVPB.  CBC shows a white count 7.5, hemoglobin 7.9 g/dL and platelets 515.  Troponin was normal x2.  BNP was 987 pg/mL.  BMP shows normal electrolytes, except for calcium of 8.6.  Glucose 107, BUN 16 creatinine 1.56 mg/dL.  His chest radiograph show hyperinflated lungs without acute cardiopulmonary disease.  CTA no evidence of acute pulmonary embolism.  There was interlobar septal thickening suggesting interstitial edema.  There was no infarction, pneumonia or pleural fluid.  There  is small focus atelectasis or infection of the right lower lobe.  Please see images and full evaluation report for further details.  Review of Systems: As per HPI otherwise all other systems reviewed and are negative.  Past Medical History:  Diagnosis Date  . Acid reflux   . Arthritis   . Asthma   . Chronic pain    with leg and back pain (disc problem)  . COPD (chronic obstructive pulmonary disease) ([place])   . Diabetes mellitus without complication ([place])   . Headache    HX OF  . History of upper GI x-ray series    to follow showed large duodenal ulcer H pylori serologies were negative  . Hypertension   . NSTEMI (non-ST elevated myocardial infarction) ([place]) 11/2019  . Pneumonia    07/17/17  . Pre-diabetes   . Stroke [facility])    TIA MINI STROKE  . Tubular adenoma     Past Surgical History:  Procedure Laterality Date  . BACK SURGERY  7/05; 5/09    Dr.Hirsch,3 lumbar  X3  . BACK SURGERY    . COLONOSCOPY  Feb 2012   Dr. Gala Romney: normal rectum, pedunculate polyp removed but not recovered  . COLONOSCOPY WITH ESOPHAGOGASTRODUODENOSCOPY (EGD) N/A 11/18/2013   Dr.Rourk- tcs= normal rectum, multipe polyps about the ileocecal valve and distal transverse colon o/w the remainder of the colonic mucosa appeared normal bx= tubular adenoma. EGD= normal esophagus, stomach with scattered erosions mottling, friablility, no ulcer or infiltrating process patent pylorus bx= chronic inflammation. next TCS 11/2016  . COLONOSCOPY WITH PROPOFOL N/A 05/25/2017   Dr. Gala Romney: sigmoid diverticulosis, one 4 mm hyerplastic rectal polyp, ascending colonic AVMs  surveillance 2023  . ESOPHAGOGASTRODUODENOSCOPY  1/06   Dr. Volney American esophageal erosions,U-shaped stomach,marked erosions and edema of the bulb without discrete ulcer disease.   . ESOPHAGOGASTRODUODENOSCOPY (EGD) WITH PROPOFOL N/A 05/25/2017   Dr. Gala Romney: reflux esophagitis s/p empiric dilation, normal stomach and duodenum  . ESOPHAGOGASTRODUODENOSCOPY (EGD) WITH  PROPOFOL Left 11/30/2019   LA Grade D esophagitis, small hiatal hernia, one non-bleeding cratered gastric ulcer in prepyloric region of stomach, many non-bleeding cratered duodenal ulcers without stigmata of bleeding, mild luminal narrowing at apex of duodenal bulb but traversed easily. H.pylori serology negative.  Marland Kitchen MALONEY DILATION N/A 05/25/2017   Procedure: Venia Minks DILATION;  Surgeon: Daneil Dolin, MD;  Location[facility];  Service: Endoscopy;  Laterality: N/A;  . NECK SURGERY  10/2007   PLATE IN NECK  . POLYPECTOMY  05/25/2017   Procedure: POLYPECTOMY;  Surgeon: Daneil Dolin, MD;  Location: [facility];  Service: Endoscopy;;  rectal  . RIGHT/LEFT HEART CATH AND CORONARY ANGIOGRAPHY N/A 12/03/2019   Procedure: RIGHT/LEFT HEART CATH AND CORONARY ANGIOGRAPHY;  Surgeon: Nigel Mormon, MD;  Location: [place] [facility] LAB;  Service: Cardiovascular;  Laterality: N/A;  . TRANSURETHRAL RESECTION OF PROSTATE N/A 08/17/2017   Procedure: TRANSURETHRAL RESECTION OF THE PROSTATE (TURP);  Surgeon: Irine Seal, MD;  Location: [facility];  Service: Urology;  Laterality: N/A;    Social History  reports that he quit smoking about 5 years ago. His smoking use included cigarettes. He has a 20.00 pack-year smoking history. He has never used smokeless tobacco. He reports previous drug use. Drug: Marijuana. He reports that he does not drink alcohol.  No Known Allergies  Family History  Problem Relation Age of Onset  . Cancer Father   . Asthma Mother   . Colon cancer Neg Hx    Prior to Admission medications   Medication Sig Start Date End Date Taking? Authorizing Provider  acetaminophen (TYLENOL) 500 MG tablet Take 500 mg by mouth every 6 (six) hours as needed (pain.).    Yes [provider]  albuterol (PROVENTIL) (2.5 MG/3ML) 0.083% nebulizer solution Take 3 mLs (2.5 mg total) by nebulization every 4 (four) hours as needed for wheezing or shortness of breath. 06/14/19  Yes Emokpae, Courage,  MD  albuterol (VENTOLIN HFA) 108 (90 Base) MCG/ACT inhaler Inhale 2 puffs into the lungs every 4 (four) hours as needed for wheezing or shortness of breath. Do not use with nebulizer 06/14/19  Yes Emokpae, Courage, MD  calcium carbonate (TUMS - DOSED IN MG ELEMENTAL CALCIUM) 500 MG chewable tablet Chew 2 tablets (400 mg of elemental calcium total) by mouth 3 (three) times daily as needed for indigestion or heartburn. 12/04/19  Yes Amin, Ankit Chirag, MD  Fluticasone-Umeclidin-Vilant (TRELEGY ELLIPTA) 100-62.5-25 MCG/INH AEPB Inhale 1 puff into the lungs daily. 06/14/19  Yes Emokpae, Courage, MD  furosemide (LASIX) 40 MG tablet Take 1 tablet (40 mg total) by mouth daily. 02/15/20  Yes Tat, Shanon Brow, MD  gabapentin (NEURONTIN) 300 MG capsule Take 1 capsule (300 mg total) by mouth 2 (two) times daily. Pt. Says he is taking twice daily. Patient taking differently: Take 600 mg by mouth in the morning, at noon, and at bedtime.  06/14/19  Yes Emokpae, Courage, MD  ibuprofen (ADVIL) 800 MG tablet Take 800 mg by mouth daily as needed for pain. 04/09/20  Yes [provider]  lidocaine (LIDODERM) 5 % Place 1 patch onto the skin daily. Remove & Discard patch within 12 hours or as directed by MD  04/01/20  Yes Maudie Flakes, MD  linaclotide [facility]) 72 MCG capsule Take 1 capsule (72 mcg total) by mouth daily before breakfast. Patient taking differently: Take 72 mcg by mouth daily as needed (constipation.).  06/14/19  Yes Emokpae, Courage, MD  magnesium oxide (MAG-OX) 400 MG tablet Take 400 mg by mouth daily.   Yes [provider]  methocarbamol (ROBAXIN) 500 MG tablet Take 1 tablet (500 mg total) by mouth every 8 (eight) hours as needed for muscle spasms. 04/01/20  Yes Maudie Flakes, MD  metoprolol tartrate (LOPRESSOR) 25 MG tablet Take 0.5 tablets (12.5 mg total) by mouth 2 (two) times daily. 12/04/19 03/20/21 Yes Amin, Jeanella Flattery, MD  OXYGEN Inhale 2.5 L into the lungs daily.   Yes [provider]  pantoprazole (PROTONIX) 40 MG tablet Take 1 tablet (40 mg total) by mouth 2 (two) times daily before a meal. Patient taking differently: Take 40 mg by mouth in the morning and at bedtime.  12/04/19 03/20/21 Yes Amin, Jeanella Flattery, MD  predniSONE (DELTASONE) 20 MG tablet Take 2 tablets (40 mg total) by mouth daily with breakfast. 03/21/20  Yes Pollina, Gwenyth Allegra, MD  sucralfate (CARAFATE) 1 GM/10ML suspension Take 10 mLs (1 g total) by mouth 4 (four) times daily -  with meals and at bedtime. 04/08/20  Yes Nanavati, Ankit, MD  traMADol (ULTRAM) 50 MG tablet Take 1 tablet (50 mg total) by mouth every 6 (six) hours as needed. 03/30/20  Yes Virgel Manifold, MD  guaiFENesin (MUCINEX) 600 MG 12 hr tablet Take 1 tablet (600 mg total) by mouth 2 (two) times daily. Patient taking differently: Take 600 mg by mouth daily.  06/14/19 06/13/20  Roxan Hockey, MD   Physical Exam: Vitals:   04/19/20 2100 04/19/20 2130 04/19/20 2136 04/19/20 2138  BP: (!) 173/94 (!) 166/89    Pulse: 87 87 91 88  Resp: (!) 33 (!) 35 (!) 32 (!) 24  Temp:      TempSrc:      SpO2: (!) 88% 90% (!) 88% 93%  Weight:      Height:       Constitutional: NAD, calm, comfortable Eyes: PERRL, lids and conjunctivae are mildly injected. ENMT: Mucous membranes are moist. Posterior pharynx clear of any exudate or lesions.poor state of repair dentition. Neck: normal, supple, no masses, no thyromegaly Respiratory: Severely decreased breath sounds on both lung fields with bilateral wheezing, no crackles.  Tachypneic in the high 20s and low 30s. Cardiovascular: Regular rate and rhythm, no murmurs / rubs / gallops. No extremity edema. 2+ pedal pulses. No carotid bruits.  Abdomen: Nondistended.  Soft, mild epigastric tenderness (chronic), no masses palpated. No hepatosplenomegaly. Bowel sounds positive.  Musculoskeletal: no clubbing / cyanosis.  Good ROM, no contractures. Normal muscle tone.  Skin: no rashes, lesions, ulcers on limited  dermatological examination. Neurologic: CN 2-12 grossly intact. Sensation intact, DTR normal. Strength 5/5 in all 4.  Psychiatric: Normal judgment and insight. Alert and oriented x 3. Normal mood.    Labs on Admission: I have personally reviewed following labs and imaging studies  CBC: Recent Labs  Lab 04/19/20 1423  WBC 7.5  HGB 10.9*  HCT 37.8*  MCV 83.4  PLT 515*    Basic Metabolic Panel: Recent Labs  Lab 04/19/20 1423  NA 142  K 4.6  CL 103  CO2 30  GLUCOSE 106*  BUN 16  CREATININE 1.56*  CALCIUM 8.6*    GFR: Estimated Creatinine Clearance: 38.9 mL/min (A) (by  C-G formula based on SCr of 1.56 mg/dL (H)).  Liver Function Tests: No results for input(s): AST, ALT, ALKPHOS, BILITOT, PROT, ALBUMIN in the last 168 hours.  Radiological Exams on Admission: DG Chest 2 View  Result Date: 04/19/2020 CLINICAL DATA:  Chest pain EXAM: CHEST - 2 VIEW COMPARISON:  04/01/2020 chest radiograph. FINDINGS: Partially visualized surgical hardware from ACDF. Stable cardiomediastinal silhouette with normal heart size. No pneumothorax. Chronic blunting of the costophrenic angles bilaterally. Hyperinflated lungs. No pulmonary edema. No acute consolidative airspace disease. IMPRESSION: 1. Hyperinflated lungs, suggesting COPD. 2. No acute cardiopulmonary disease. Electronically Signed   By: Ilona Sorrel M.D.   On: 04/19/2020 14:11   CT Angio Chest PE W and/or Wo Contrast  Result Date: 04/19/2020 CLINICAL DATA:  Short of breath, RCA or pleural effusion suspected. EXAM: CT ANGIOGRAPHY CHEST WITH CONTRAST TECHNIQUE: Multidetector CT imaging of the chest was performed using the standard protocol during bolus administration of intravenous contrast. Multiplanar CT image reconstructions and MIPs were obtained to evaluate the vascular anatomy. CONTRAST:  26m OMNIPAQUE IOHEXOL 350 MG/ML SOLN COMPARISON:  None. FINDINGS: Cardiovascular: No filling defects within the pulmonary arteries to suggest acute  pulmonary embolism. No acute findings aorta great vessels. No pericardial fluid. Mediastinum/Nodes: No axillary or supraclavicular adenopathy. No mediastinal adenopathy. No pericardial effusion. Esophagus normal. Borderline enlarged RIGHT perihilar lymph nodes measure up to 9 mm (image 57/4). No changed from comparison exam. Lungs/Pleura: No pulmonary infarction. No pneumonia. No pleural fluid. Interlobular septal thickening noted. Small band of atelectasis in the RIGHT lower lobe on image 86/8. Upper Abdomen: Limited view of the liver, kidneys, pancreas are unremarkable. Normal adrenal glands. Musculoskeletal: No aggressive osseous lesion. Review of the MIP images confirms the above findings. IMPRESSION: 1. No evidence acute pulmonary embolism. 2. Interlobular septal thickening suggests interstitial edema. 3. No pulmonary infarction, pneumonia or pleural fluid. 4. Small focus of atelectasis or infection RIGHT lower lobe. Electronically Signed   By: SSuzy BouchardM.D.   On: 04/19/2020 20:27   11/24/2019 echo IMPRESSIONS   1. Left ventricular ejection fraction, by estimation, is 55 to 60%. The  left ventricle has normal function. The left ventricle has no regional  wall motion abnormalities. Left ventricular diastolic parameters are  consistent with Grade I diastolic  dysfunction (impaired relaxation). There is the interventricular septum is  flattened in systole and diastole, consistent with right ventricular  pressure and volume overload.  2. Right ventricular systolic function is low normal. The right  ventricular size is mildly enlarged. mildly increased right ventricular  wall thickness. There is moderately elevated pulmonary artery systolic  pressure. The estimated right ventricular  systolic pressure is 638.8mmHg.  3. Right atrial size was mild to moderately dilated.  4. The mitral valve is grossly normal. Trivial mitral valve  regurgitation.  5. Tricuspid valve regurgitation is  mild to moderate.  6. The aortic valve is tricuspid. Aortic valve regurgitation is not  visualized.  7. The inferior vena cava is normal in size with greater than 50%  respiratory variability, suggesting right atrial pressure of 3 mmHg.   Comparison(s): Changes from prior study are noted. 06/13/2019 - LVEF  60-65%, severe pulmonary hypertension - RVSP 70 mmHg.   EKG: Independently reviewed. Vent. rate 82 BPM PR interval 118 ms QRS duration 72 ms QT/QTc 372/434 ms P-R-T axes 81 99 76 Normal sinus rhythm Rightward axis Borderline ECG  Assessment/Plan Principal Problem:   Acute on chronic diastolic congestive heart failure (HCC) Observation/telemetry. Continue supplemental oxygen. Treatment  as below for COPD. Fluid and sodium restriction. Morphine 4 mg IVP for pleuritic CP/HF. Furosemide 40 mg IVP twice a day. Monitor daily weights, renal function electrolytes.  Active Problems:   Acute on chronic respiratory failure with hypoxia (HCC)   COPD (chronic obstructive pulmonary disease) (HCC)  Continue supplemental oxygen. Scheduled and as needed bronchodilators. Guaifenesin 600 mg p.o. twice daily. Continue Solu-Medrol 40 mg IVP every 6 hours. CBG monitoring with RI SS coverage while on glucocorticoids.    CKD (chronic kidney disease) stage 3, GFR 30-59 ml/min  Monitor renal function electrolytes.    Microcytic anemia Monitor H&H. Iron deficiency on anemia panel Start iron supplementation.    BPH (benign prostatic hyperplasia) Continue tamsulosin 0.4 mg p.o. daily.    Non-insulin dependent type 2 diabetes mellitus (HCC) Carbohydrate modified diet. CBG monitoring with regular insulin sliding scale while on glucocorticoids.    Mixed hyperlipidemia Not on medical therapy. Follow-up with PCP.    GERD   PUD (peptic ulcer disease) Continue PPI    DVT prophylaxis: SCDs. Code Status:   Full code. Family Communication:   Disposition Plan:   Patient is  from:  Home.  Anticipated DC to:  Home.  Anticipated DC date:  04/20/2020 or 04/21/2020  Anticipated DC barriers: Clinical status.  Consults called: Admission status:  Observation/telemetry.    Severity of Illness:High given combined COPD with diastolic CHF exacerbation.  [name] MD [facility]  How to contact the [facility] Attending or Consulting provider [place] or covering provider during after hours [place], for this patient?   1. Check the care team in [facility] and look for a) attending/consulting [facility] provider listed and b) the [facility] team listed 2. Log into [email or web address] and use [place] universal password to access. If you do not have the password, please contact the hospital operator. 3. Locate the [facility] provider you are looking for under [facility] and page to a number that you can be directly reached. 4. If you still have difficulty reaching the provider, please page the [facility] (Director on Call) for the Hospitalists listed on amion for assistance.  04/19/2020, 10:28 PM   This document was prepared using Dragon voice recognition software and may contain some unintended transcription errors.

## 2020-04-20 ENCOUNTER — Encounter (HOSPITAL_COMMUNITY): Payer: Self-pay | Admitting: Internal Medicine

## 2020-04-20 ENCOUNTER — Other Ambulatory Visit: Payer: Self-pay

## 2020-04-20 DIAGNOSIS — D509 Iron deficiency anemia, unspecified: Secondary | ICD-10-CM | POA: Diagnosis present

## 2020-04-20 DIAGNOSIS — K277 Chronic peptic ulcer, site unspecified, without hemorrhage or perforation: Secondary | ICD-10-CM | POA: Diagnosis present

## 2020-04-20 DIAGNOSIS — Z20822 Contact with and (suspected) exposure to covid-19: Secondary | ICD-10-CM | POA: Diagnosis present

## 2020-04-20 DIAGNOSIS — Z8701 Personal history of pneumonia (recurrent): Secondary | ICD-10-CM | POA: Diagnosis not present

## 2020-04-20 DIAGNOSIS — Z8719 Personal history of other diseases of the digestive system: Secondary | ICD-10-CM | POA: Diagnosis not present

## 2020-04-20 DIAGNOSIS — I130 Hypertensive heart and chronic kidney disease with heart failure and stage 1 through stage 4 chronic kidney disease, or unspecified chronic kidney disease: Secondary | ICD-10-CM | POA: Diagnosis present

## 2020-04-20 DIAGNOSIS — Z8673 Personal history of transient ischemic attack (TIA), and cerebral infarction without residual deficits: Secondary | ICD-10-CM | POA: Diagnosis not present

## 2020-04-20 DIAGNOSIS — N400 Enlarged prostate without lower urinary tract symptoms: Secondary | ICD-10-CM | POA: Diagnosis present

## 2020-04-20 DIAGNOSIS — Z87891 Personal history of nicotine dependence: Secondary | ICD-10-CM | POA: Diagnosis not present

## 2020-04-20 DIAGNOSIS — E1122 Type 2 diabetes mellitus with diabetic chronic kidney disease: Secondary | ICD-10-CM | POA: Diagnosis present

## 2020-04-20 DIAGNOSIS — Z86010 Personal history of colonic polyps: Secondary | ICD-10-CM | POA: Diagnosis not present

## 2020-04-20 DIAGNOSIS — I252 Old myocardial infarction: Secondary | ICD-10-CM | POA: Diagnosis not present

## 2020-04-20 DIAGNOSIS — Z9079 Acquired absence of other genital organ(s): Secondary | ICD-10-CM | POA: Diagnosis not present

## 2020-04-20 DIAGNOSIS — M1990 Unspecified osteoarthritis, unspecified site: Secondary | ICD-10-CM | POA: Diagnosis present

## 2020-04-20 DIAGNOSIS — G8929 Other chronic pain: Secondary | ICD-10-CM | POA: Diagnosis present

## 2020-04-20 DIAGNOSIS — Z7952 Long term (current) use of systemic steroids: Secondary | ICD-10-CM | POA: Diagnosis not present

## 2020-04-20 DIAGNOSIS — I5033 Acute on chronic diastolic (congestive) heart failure: Secondary | ICD-10-CM | POA: Diagnosis present

## 2020-04-20 DIAGNOSIS — J9621 Acute and chronic respiratory failure with hypoxia: Secondary | ICD-10-CM | POA: Diagnosis present

## 2020-04-20 DIAGNOSIS — D631 Anemia in chronic kidney disease: Secondary | ICD-10-CM | POA: Diagnosis present

## 2020-04-20 DIAGNOSIS — N1831 Chronic kidney disease, stage 3a: Secondary | ICD-10-CM | POA: Diagnosis present

## 2020-04-20 DIAGNOSIS — E782 Mixed hyperlipidemia: Secondary | ICD-10-CM | POA: Diagnosis present

## 2020-04-20 DIAGNOSIS — M549 Dorsalgia, unspecified: Secondary | ICD-10-CM | POA: Diagnosis present

## 2020-04-20 DIAGNOSIS — Z825 Family history of asthma and other chronic lower respiratory diseases: Secondary | ICD-10-CM | POA: Diagnosis not present

## 2020-04-20 DIAGNOSIS — J441 Chronic obstructive pulmonary disease with (acute) exacerbation: Secondary | ICD-10-CM | POA: Diagnosis present

## 2020-04-20 LAB — CBC
HCT: 38.1 % — ABNORMAL LOW (ref 39.0–52.0)
Hemoglobin: 10.9 g/dL — ABNORMAL LOW (ref 13.0–17.0)
MCH: 23.4 pg — ABNORMAL LOW (ref 26.0–34.0)
MCHC: 28.6 g/dL — ABNORMAL LOW (ref 30.0–36.0)
MCV: 81.8 fL (ref 80.0–100.0)
Platelets: 587 10*3/uL — ABNORMAL HIGH (ref 150–400)
RBC: 4.66 MIL/uL (ref 4.22–5.81)
RDW: 18.7 % — ABNORMAL HIGH (ref 11.5–15.5)
WBC: 9.3 10*3/uL (ref 4.0–10.5)
nRBC: 0 % (ref 0.0–0.2)

## 2020-04-20 LAB — BASIC METABOLIC PANEL
Anion gap: 14 (ref 5–15)
BUN: 15 mg/dL (ref 8–23)
CO2: 32 mmol/L (ref 22–32)
Calcium: 8.8 mg/dL — ABNORMAL LOW (ref 8.9–10.3)
Chloride: 95 mmol/L — ABNORMAL LOW (ref 98–111)
Creatinine, Ser: 1.41 mg/dL — ABNORMAL HIGH (ref 0.61–1.24)
GFR calc Af Amer: >60 mL/min (ref >60)
GFR calc non Af Amer: 53 mL/min — ABNORMAL LOW (ref >60)
Glucose, Bld: 116 mg/dL — ABNORMAL HIGH (ref 70–99)
Potassium: 3.9 mmol/L (ref 3.5–5.1)
Sodium: 141 mmol/L (ref 135–145)

## 2020-04-20 LAB — GLUCOSE, CAPILLARY
Glucose-Capillary: 128 mg/dL — ABNORMAL HIGH (ref 70–99)
Glucose-Capillary: 259 mg/dL — ABNORMAL HIGH (ref 70–99)
Glucose-Capillary: 194 mg/dL — ABNORMAL HIGH (ref 70–99)
Glucose-Capillary: 181 mg/dL — ABNORMAL HIGH (ref 70–99)

## 2020-04-20 LAB — CBG MONITORING, ED: Glucose-Capillary: 131 mg/dL — ABNORMAL HIGH (ref 70–99)

## 2020-04-20 MED ORDER — MORPHINE SULFATE (PF) 4 MG/ML IV SOLN
4.0000 mg | Freq: Once | INTRAVENOUS | Status: AC
  Administered 2020-04-20: 4 mg via INTRAVENOUS
  Filled 2020-04-20: qty 1

## 2020-04-20 MED ORDER — NITROGLYCERIN 2 % TD OINT
0.5000 [in_us] | TOPICAL_OINTMENT | Freq: Four times a day (QID) | TRANSDERMAL | Status: AC
  Administered 2020-04-20: 0.5 [in_us] via TOPICAL
  Filled 2020-04-20: qty 1

## 2020-04-20 MED ORDER — METHOCARBAMOL 500 MG PO TABS
750.0000 mg | ORAL_TABLET | Freq: Once | ORAL | Status: AC
  Administered 2020-04-20: 750 mg via ORAL
  Filled 2020-04-20: qty 2

## 2020-04-20 MED ORDER — METHYLPREDNISOLONE SODIUM SUCC 125 MG IJ SOLR
125.0000 mg | Freq: Once | INTRAMUSCULAR | Status: AC
  Administered 2020-04-20: 125 mg via INTRAVENOUS
  Filled 2020-04-20: qty 2

## 2020-04-20 MED ORDER — LEVALBUTEROL HCL 1.25 MG/0.5ML IN NEBU
1.2500 mg | INHALATION_SOLUTION | Freq: Three times a day (TID) | RESPIRATORY_TRACT | Status: DC
  Administered 2020-04-21 – 2020-04-22 (×4): 1.25 mg via RESPIRATORY_TRACT
  Filled 2020-04-20 (×5): qty 0.5

## 2020-04-20 MED ORDER — INSULIN ASPART 100 UNIT/ML ~~LOC~~ SOLN
0.0000 [IU] | Freq: Three times a day (TID) | SUBCUTANEOUS | Status: DC
  Administered 2020-04-20: 3 [IU] via SUBCUTANEOUS
  Administered 2020-04-20: 8 [IU] via SUBCUTANEOUS
  Administered 2020-04-20 – 2020-04-21 (×3): 2 [IU] via SUBCUTANEOUS
  Administered 2020-04-21: 3 [IU] via SUBCUTANEOUS
  Administered 2020-04-22: 2 [IU] via SUBCUTANEOUS
  Administered 2020-04-22: 3 [IU] via SUBCUTANEOUS
  Filled 2020-04-20: qty 1

## 2020-04-20 MED ORDER — FLUTICASONE FUROATE-VILANTEROL 100-25 MCG/INH IN AEPB
1.0000 | INHALATION_SPRAY | Freq: Every day | RESPIRATORY_TRACT | Status: DC
  Administered 2020-04-20 – 2020-04-22 (×3): 1 via RESPIRATORY_TRACT
  Filled 2020-04-20: qty 28

## 2020-04-20 MED ORDER — PREDNISONE 20 MG PO TABS
40.0000 mg | ORAL_TABLET | Freq: Every day | ORAL | Status: DC
  Administered 2020-04-21 – 2020-04-22 (×2): 40 mg via ORAL
  Filled 2020-04-20 (×4): qty 2

## 2020-04-20 MED ORDER — METHYLPREDNISOLONE SODIUM SUCC 40 MG IJ SOLR
40.0000 mg | Freq: Four times a day (QID) | INTRAMUSCULAR | Status: AC
  Administered 2020-04-20 (×3): 40 mg via INTRAVENOUS
  Filled 2020-04-20 (×3): qty 1

## 2020-04-20 MED ORDER — LEVALBUTEROL HCL 1.25 MG/0.5ML IN NEBU
1.2500 mg | INHALATION_SOLUTION | Freq: Four times a day (QID) | RESPIRATORY_TRACT | Status: DC
  Administered 2020-04-20 (×4): 1.25 mg via RESPIRATORY_TRACT
  Filled 2020-04-20 (×4): qty 0.5

## 2020-04-20 MED ORDER — UMECLIDINIUM BROMIDE 62.5 MCG/INH IN AEPB
1.0000 | INHALATION_SPRAY | Freq: Every day | RESPIRATORY_TRACT | Status: DC
  Administered 2020-04-20 – 2020-04-22 (×3): 1 via RESPIRATORY_TRACT
  Filled 2020-04-20: qty 7

## 2020-04-20 MED ORDER — MAGNESIUM SULFATE 2 GM/50ML IV SOLN
2.0000 g | Freq: Once | INTRAVENOUS | Status: AC
  Administered 2020-04-20: 2 g via INTRAVENOUS
  Filled 2020-04-20: qty 50

## 2020-04-20 NOTE — Care Management Obs Status (Signed)
[place] NOTIFICATION   Patient Details  Name: LEMONTE AL MRN: [id number] Date of Birth: [birth year]   Medicare Observation Status Notification Given:  Yes    Sherie Don, LCSW 04/20/2020, 2:11 PM

## 2020-04-20 NOTE — ED Notes (Signed)
ED TO INPATIENT HANDOFF REPORT  ED Nurse Name and Phone #:  Fabio Neighbors RN [phone]  S Name/Age/Gender Bryan [name] 64 y.o. male Room/Bed: APA18/APA18  Code Status   Code Status: Full Code  Home/SNF/Other Home Patient oriented to: self, place, time and situation Is this baseline? Yes   Triage Complete: Triage complete  Chief Complaint Acute on chronic diastolic congestive heart failure (HCC) [I50.33]  Triage Note Pt to er, pt c/o general body aches, pt states that he also has chest pain and it hurts to breath, pt is on 2.5 L via [place], states that this is his normal O2 at home.      Allergies No Known Allergies  Level of Care/Admitting Diagnosis ED Disposition    ED Disposition Condition Comment   Admit  Hospital Area: [facility] [[id number]]  Level of Care: Telemetry [5]  Covid Evaluation: Asymptomatic Screening Protocol (No Symptoms)  Diagnosis: Acute on chronic diastolic congestive heart failure [facility]) [[id number]]  Admitting Physician: [name] [[id number]]  Attending Physician: [name] [[id number]]       B Medical/Surgery History Past Medical History:  Diagnosis Date  . Acid reflux   . Arthritis   . Asthma   . Chronic pain    with leg and back pain (disc problem)  . COPD (chronic obstructive pulmonary disease) ([place])   . Diabetes mellitus without complication ([place])   . Headache    HX OF  . History of upper GI x-ray series    to follow showed large duodenal ulcer H pylori serologies were negative  . Hypertension   . NSTEMI (non-ST elevated myocardial infarction) ([place]) 11/2019  . Pneumonia    07/17/17  . Pre-diabetes   . Stroke [facility])    TIA MINI STROKE  . Tubular adenoma    Past Surgical History:  Procedure Laterality Date  . BACK SURGERY  7/05; 5/09    Dr.Hirsch,3 lumbar  X3  . BACK SURGERY    . COLONOSCOPY  Feb 2012   Dr. Gala Romney: normal rectum, pedunculate polyp removed but not recovered  . COLONOSCOPY WITH  ESOPHAGOGASTRODUODENOSCOPY (EGD) N/A 11/18/2013   Dr.Rourk- tcs= normal rectum, multipe polyps about the ileocecal valve and distal transverse colon o/w the remainder of the colonic mucosa appeared normal bx= tubular adenoma. EGD= normal esophagus, stomach with scattered erosions mottling, friablility, no ulcer or infiltrating process patent pylorus bx= chronic inflammation. next TCS 11/2016  . COLONOSCOPY WITH PROPOFOL N/A 05/25/2017   Dr. Gala Romney: sigmoid diverticulosis, one 4 mm hyerplastic rectal polyp, ascending colonic AVMs surveillance 2023  . ESOPHAGOGASTRODUODENOSCOPY  1/06   Dr. Volney American esophageal erosions,U-shaped stomach,marked erosions and edema of the bulb without discrete ulcer disease.   . ESOPHAGOGASTRODUODENOSCOPY (EGD) WITH PROPOFOL N/A 05/25/2017   Dr. Gala Romney: reflux esophagitis s/p empiric dilation, normal stomach and duodenum  . ESOPHAGOGASTRODUODENOSCOPY (EGD) WITH PROPOFOL Left 11/30/2019   LA Grade D esophagitis, small hiatal hernia, one non-bleeding cratered gastric ulcer in prepyloric region of stomach, many non-bleeding cratered duodenal ulcers without stigmata of bleeding, mild luminal narrowing at apex of duodenal bulb but traversed easily. H.pylori serology negative.  Marland Kitchen MALONEY DILATION N/A 05/25/2017   Procedure: Venia Minks DILATION;  Surgeon: Daneil Dolin, MD;  [facility];  Service: Endoscopy;  Laterality: N/A;  . NECK SURGERY  10/2007   PLATE IN NECK  . POLYPECTOMY  05/25/2017   Procedure: POLYPECTOMY;  Surgeon: Daneil Dolin, MD;  Location: [facility];  Service: Endoscopy;;  rectal  .  RIGHT/LEFT HEART CATH AND CORONARY ANGIOGRAPHY N/A 12/03/2019   Procedure: RIGHT/LEFT HEART CATH AND CORONARY ANGIOGRAPHY;  Surgeon: Nigel Mormon, MD;  Location: [place] CV LAB;  Service: Cardiovascular;  Laterality: N/A;  . TRANSURETHRAL RESECTION OF PROSTATE N/A 08/17/2017   Procedure: TRANSURETHRAL RESECTION OF THE PROSTATE (TURP);  Surgeon: Irine Seal, MD;   Location: [facility] ORS;  Service: Urology;  Laterality: N/A;     A IV Location/Drains/Wounds Patient Lines/Drains/Airways Status    Active Line/Drains/Airways    Name Placement date Placement time Site Days   Peripheral IV 04/19/20 Left;Upper Arm 04/19/20  1824  Arm  1          Intake/Output Last 24 hours  Intake/Output Summary (Last 24 hours) at 04/20/2020 0830 Last data filed at 04/20/2020 0651 Gross per 24 hour  Intake 1.47 ml  Output 2650 ml  Net -2648.53 ml    Labs/Imaging Results for orders placed or performed during the hospital encounter of 04/19/20 (from the past 48 hour(s))  Basic metabolic panel     Status: Abnormal   Collection Time: 04/19/20  2:23 PM  Result Value Ref Range   Sodium 142 135 - 145 mmol/L   Potassium 4.6 3.5 - 5.1 mmol/L   Chloride 103 98 - 111 mmol/L   CO2 30 22 - 32 mmol/L   Glucose, Bld 106 (H) 70 - 99 mg/dL    Comment: Glucose reference range applies only to samples taken after fasting for at least 8 hours.   BUN 16 8 - 23 mg/dL   Creatinine, Ser 1.56 (H) 0.61 - 1.24 mg/dL   Calcium 8.6 (L) 8.9 - 10.3 mg/dL   GFR calc non Af Amer 47 (L) >60 mL/min   GFR calc Af Amer 54 (L) >60 mL/min   Anion gap 9 5 - 15    Comment: Performed at [facility], [address], [place], [place] [address]  CBC     Status: Abnormal   Collection Time: 04/19/20  2:23 PM  Result Value Ref Range   WBC 7.5 4.0 - 10.5 K/uL   RBC 4.53 4.22 - 5.81 MIL/uL   Hemoglobin 10.9 (L) 13.0 - 17.0 g/dL   HCT 37.8 (L) 39 - 52 %   MCV 83.4 80.0 - 100.0 fL   MCH 24.1 (L) 26.0 - 34.0 pg   MCHC 28.8 (L) 30.0 - 36.0 g/dL   RDW 19.1 (H) 11.5 - 15.5 %   Platelets 515 (H) 150 - 400 K/uL   nRBC 0.0 0.0 - 0.2 %    Comment: Performed at [facility], [address] [place], [place] [address]  Troponin I (High Sensitivity)     Status: None   Collection Time: 04/19/20  2:23 PM  Result Value Ref Range   Troponin I (High Sensitivity) 15 <18 ng/L    Comment: (NOTE) Elevated high sensitivity  troponin I (hsTnI) values and significant  changes across serial measurements may suggest ACS but many other  chronic and acute conditions are known to elevate hsTnI results.  Refer to the "Links" section for chest pain algorithms and additional  guidance. Performed at [facility], [address], [place], [place] [address]   Troponin I (High Sensitivity)     Status: None   Collection Time: 04/19/20  3:51 PM  Result Value Ref Range   Troponin I (High Sensitivity) 15 <18 ng/L    Comment: (NOTE) Elevated high sensitivity troponin I (hsTnI) values and significant  changes across serial measurements may suggest ACS  but many other  chronic and acute conditions are known to elevate hsTnI results.  Refer to the "Links" section for chest pain algorithms and additional  guidance. Performed at [facility], [address], [place], [place] [address]   Brain natriuretic peptide     Status: Abnormal   Collection Time: 04/19/20  3:51 PM  Result Value Ref Range   B Natriuretic Peptide 987.0 (H) 0.0 - 100.0 pg/mL    Comment: Performed at [facility], [address] [place], [place] [address]  SARS Coronavirus 2 by RT PCR (hospital order, performed in [facility] hospital lab) Nasopharyngeal Nasopharyngeal Swab     Status: None   Collection Time: 04/19/20  8:04 PM   Specimen: Nasopharyngeal Swab  Result Value Ref Range   SARS Coronavirus 2 NEGATIVE NEGATIVE    Comment: (NOTE) SARS-CoV-2 target nucleic acids are NOT DETECTED.  The SARS-CoV-2 RNA is generally detectable in upper and lower respiratory specimens during the acute phase of infection. The lowest concentration of SARS-CoV-2 viral copies this assay can detect is 250 copies / mL. A negative result does not preclude SARS-CoV-2 infection and should not be used as the sole basis for treatment or other patient management decisions.  A negative result may occur with improper specimen collection / handling, submission of specimen other than  nasopharyngeal swab, presence of viral mutation(s) within the areas targeted by this assay, and inadequate number of viral copies (<250 copies / mL). A negative result must be combined with clinical observations, patient history, and epidemiological information.  Fact Sheet for Patients:   StrictlyIdeas.no  Fact Sheet for Healthcare Providers: BankingDealers.co.za  This test is not yet approved or  cleared by the [place] FDA and has been authorized for detection and/or diagnosis of SARS-CoV-2 by FDA under an Emergency Use Authorization (EUA).  This EUA will remain in effect (meaning this test can be used) for the duration of the COVID-19 declaration under Section 564(b)(1) of the Act, 21 U.S.C. section 360bbb-3(b)(1), unless the authorization is terminated or revoked sooner.  Performed at [facility], [address], [place], [place] [address]   Basic metabolic panel     Status: Abnormal   Collection Time: 04/20/20  4:03 AM  Result Value Ref Range   Sodium 141 135 - 145 mmol/L   Potassium 3.9 3.5 - 5.1 mmol/L   Chloride 95 (L) 98 - 111 mmol/L   CO2 32 22 - 32 mmol/L   Glucose, Bld 116 (H) 70 - 99 mg/dL    Comment: Glucose reference range applies only to samples taken after fasting for at least 8 hours.   BUN 15 8 - 23 mg/dL   Creatinine, Ser 1.41 (H) 0.61 - 1.24 mg/dL   Calcium 8.8 (L) 8.9 - 10.3 mg/dL   GFR calc non Af Amer 53 (L) >60 mL/min   GFR calc Af Amer >60 >60 mL/min   Anion gap 14 5 - 15    Comment: Performed at [facility], [address] [place], [place] [address]  CBC     Status: Abnormal   Collection Time: 04/20/20  4:03 AM  Result Value Ref Range   WBC 9.3 4.0 - 10.5 K/uL   RBC 4.66 4.22 - 5.81 MIL/uL   Hemoglobin 10.9 (L) 13.0 - 17.0 g/dL   HCT 38.1 (L) 39 - 52 %   MCV 81.8 80.0 - 100.0 fL   MCH 23.4 (L) 26.0 - 34.0 pg   MCHC 28.6 (L) 30.0 - 36.0 g/dL  RDW 18.7 (H) 11.5 - 15.5 %   Platelets 587 (H)  150 - 400 K/uL   nRBC 0.0 0.0 - 0.2 %    Comment: Performed at [facility], [address] [place], [place] [address]  CBG monitoring, ED     Status: Abnormal   Collection Time: 04/20/20  8:12 AM  Result Value Ref Range   Glucose-Capillary 131 (H) 70 - 99 mg/dL    Comment: Glucose reference range applies only to samples taken after fasting for at least 8 hours.   DG Chest 2 View  Result Date: 04/19/2020 CLINICAL DATA:  Chest pain EXAM: CHEST - 2 VIEW COMPARISON:  04/01/2020 chest radiograph. FINDINGS: Partially visualized surgical hardware from ACDF. Stable cardiomediastinal silhouette with normal heart size. No pneumothorax. Chronic blunting of the costophrenic angles bilaterally. Hyperinflated lungs. No pulmonary edema. No acute consolidative airspace disease. IMPRESSION: 1. Hyperinflated lungs, suggesting COPD. 2. No acute cardiopulmonary disease. Electronically Signed   By: Ilona Sorrel M.D.   On: 04/19/2020 14:11   CT Angio Chest PE W and/or Wo Contrast  Result Date: 04/19/2020 CLINICAL DATA:  Short of breath, RCA or pleural effusion suspected. EXAM: CT ANGIOGRAPHY CHEST WITH CONTRAST TECHNIQUE: Multidetector CT imaging of the chest was performed using the standard protocol during bolus administration of intravenous contrast. Multiplanar CT image reconstructions and MIPs were obtained to evaluate the vascular anatomy. CONTRAST:  29m OMNIPAQUE IOHEXOL 350 MG/ML SOLN COMPARISON:  None. FINDINGS: Cardiovascular: No filling defects within the pulmonary arteries to suggest acute pulmonary embolism. No acute findings aorta great vessels. No pericardial fluid. Mediastinum/Nodes: No axillary or supraclavicular adenopathy. No mediastinal adenopathy. No pericardial effusion. Esophagus normal. Borderline enlarged RIGHT perihilar lymph nodes measure up to 9 mm (image 57/4). No changed from comparison exam. Lungs/Pleura: No pulmonary infarction. No pneumonia. No pleural fluid. Interlobular septal thickening  noted. Small band of atelectasis in the RIGHT lower lobe on image 86/8. Upper Abdomen: Limited view of the liver, kidneys, pancreas are unremarkable. Normal adrenal glands. Musculoskeletal: No aggressive osseous lesion. Review of the MIP images confirms the above findings. IMPRESSION: 1. No evidence acute pulmonary embolism. 2. Interlobular septal thickening suggests interstitial edema. 3. No pulmonary infarction, pneumonia or pleural fluid. 4. Small focus of atelectasis or infection RIGHT lower lobe. Electronically Signed   By: SSuzy BouchardM.D.   On: 04/19/2020 20:27    Pending Labs Unresulted Labs (From admission, onward) Comment          Start     Ordered   04/20/20 09937 Basic metabolic panel  Daily,   R      04/19/20 2225          Vitals/Pain Today's Vitals   04/20/20 0500 04/20/20 0530 04/20/20 0600 04/20/20 0630  BP: 132/85 125/70 114/69   Pulse: 91 90 87 92  Resp: (!) 27 (!) 32 (!) 28   Temp:      TempSrc:      SpO2: 96% 94% 96% 99%  Weight:      Height:      PainSc:        Isolation Precautions No active isolations  Medications Medications  sodium chloride flush (NS) 0.9 % injection 3 mL (3 mLs Intravenous Not Given 04/19/20 1933)  aerochamber plus with mask device 1 each (1 each Other Not Given 04/19/20 1944)  sodium chloride flush (NS) 0.9 % injection 3 mL (3 mLs Intravenous Not Given 04/19/20 2239)  sodium chloride flush (NS) 0.9 % injection 3 mL (has no  administration in time range)  0.9 %  sodium chloride infusion (has no administration in time range)  ondansetron (ZOFRAN) tablet 4 mg (has no administration in time range)    Or  ondansetron (ZOFRAN) injection 4 mg (has no administration in time range)  acetaminophen (TYLENOL) tablet 650 mg (has no administration in time range)    Or  acetaminophen (TYLENOL) suppository 650 mg (has no administration in time range)  furosemide (LASIX) injection 40 mg (has no administration in time range)  albuterol (PROVENTIL)  (2.5 MG/3ML) 0.083% nebulizer solution 2.5 mg (has no administration in time range)  gabapentin (NEURONTIN) capsule 300 mg (300 mg Oral Given 04/19/20 2353)  guaiFENesin (MUCINEX) 12 hr tablet 600 mg (600 mg Oral Given 04/19/20 2353)  lidocaine (LIDODERM) 5 % 1 patch (1 patch Transdermal Refused 04/19/20 2354)  methocarbamol (ROBAXIN) tablet 500 mg (has no administration in time range)  pantoprazole (PROTONIX) EC tablet 40 mg (40 mg Oral Given 04/19/20 2353)  sucralfate (CARAFATE) 1 GM/10ML suspension 1 g (has no administration in time range)  traMADol (ULTRAM) tablet 50 mg (has no administration in time range)  levalbuterol (XOPENEX) nebulizer solution 1.25 mg (1.25 mg Nebulization Given 04/20/20 0151)  nitroGLYCERIN (NITROGLYN) 2 % ointment 0.5 inch (0 inches Topical Hold 04/20/20 0615)  methylPREDNISolone sodium succinate (SOLU-MEDROL) 40 mg/mL injection 40 mg (has no administration in time range)    Followed by  predniSONE (DELTASONE) tablet 40 mg (has no administration in time range)  insulin aspart (novoLOG) injection 0-15 Units (has no administration in time range)  fluticasone furoate-vilanterol (BREO ELLIPTA) 100-25 MCG/INH 1 puff (has no administration in time range)    And  umeclidinium bromide (INCRUSE ELLIPTA) 62.5 MCG/INH 1 puff (has no administration in time range)  albuterol (VENTOLIN HFA) 108 (90 Base) MCG/ACT inhaler 4 puff (4 puffs Inhalation Given 04/19/20 1942)  iohexol (OMNIPAQUE) 350 MG/ML injection 80 mL (80 mLs Intravenous Contrast Given 04/19/20 1943)  furosemide (LASIX) injection 60 mg (60 mg Intravenous Given 04/19/20 2049)  methylPREDNISolone sodium succinate (SOLU-MEDROL) 125 mg/2 mL injection 125 mg (125 mg Intravenous Given 04/20/20 0059)  magnesium sulfate IVPB 2 g 50 mL ( Intravenous Stopped 04/20/20 0108)  morphine 4 MG/ML injection 4 mg (4 mg Intravenous Given 04/20/20 0059)    Mobility walks Low fall risk   Focused Assessments    R Recommendations: See Admitting  Provider Note  Report given to:   Additional Notes:

## 2020-04-20 NOTE — Progress Notes (Signed)
[name], is a 64 y.o. male, DOB - Nov 28, 1955, PIR:[id number]  Admit date - 04/19/2020   Admitting Physician Archer Vise Denton Brick, MD  Outpatient Primary MD for the patient is Rosita Fire, MD  LOS - 0   Chief Complaint  Patient presents with  . Chest Pain        Subjective:    Bryan [name] today has no fevers, no emesis,  No chest pain, complains of shortness of breath, dyspnea on exertion and leg cramps, voiding quite a bit  Assessment  & Plan :    Principal Problem:   Acute on chronic diastolic congestive heart failure (HCC) Active Problems:   COPD (chronic obstructive pulmonary disease) (HCC)   Acute exacerbation of CHF (congestive heart failure) /HFpEF (EF 60%)   CKD (chronic kidney disease) stage 3, GFR 30-59 ml/min   GERD   Microcytic anemia   BPH (benign prostatic hyperplasia)   Non-insulin dependent type 2 diabetes mellitus (HCC)   Mixed hyperlipidemia   PUD (peptic ulcer disease)   Acute on chronic respiratory failure with hypoxia (HCC)  Brief Summary:- 64 y.o. male with medical history significant of acid reflux, arthritis, asthma, chronic back pain, COPD, type 2 diabetes, hypertension, history of pneumonia, history of other nonhemorrhagic CVA/TIA, tubular adenoma chronic diastolic CHF admitted on 02/13/159 with acute on chronic hypoxic respiratory failure secondary to acute on chronic diastolic CHF exacerbation despite oral diuretics PTA  A/p 1) acute on chronic hypoxic respiratory failure in the setting of worsening diastolic dysfunction--- PTA patient used to be on 2 1/2 L of oxygen via nasal cannula required up to 4 L of oxygen upon presentation -Failed oral diuretics at home--anticipate oxygen requirement to improve with adequate diuresis  2) acute on chronic diastolic dysfunction CHF--- continue IV diuresis, patient failed oral diuretics PTA, -Last known EF  55 to 60% on echo from 11/24/2019 =-Daily weight and fluid input and output monitoring as advised  3) CKD IIIa--- renal function appears to be at baseline, may have to tolerate some AKI/worsening renal function if need be to achieve adequate diuresis --, renally adjust medications, avoid nephrotoxic agents / dehydration  / hypotension  4)Acute COPD exacerbation --- no acute exacerbation at this time, continue bronchodilators mucolytics and IV steroids  5)DM2--anticipate worsening glycemic control with steroids, Use Novolog/Humalog Sliding scale insulin with Accu-Cheks/Fingersticks as ordered   6)BPH--stable, continue Flomax  7) chronic anemia--- normocytic hypochromic--- anemia work-up in progress, hemoglobin currently at baseline --Query if probably related to underlying CKD  Disposition/Need for in-Hospital Stay- patient unable to be discharged at this time due to --acute COPD exacerbation requiring IV steroids, CHF exacerbation requiring IV Lasix, acute on chronic hypoxic respiratory failure requiring increased supplemental oxygen*  Status is: Inpatient  Remains inpatient appropriate because:acute COPD exacerbation requiring IV steroids, CHF exacerbation requiring IV Lasix, acute on chronic hypoxic respiratory failure requiring increased supplemental oxygen*   Disposition: The patient is from: Home              Anticipated d/c is to: Home              Anticipated d/c date is: 2 days              Patient currently  is not medically stable to d/c. Barriers: Not Clinically Stable- acute COPD exacerbation requiring IV steroids, CHF exacerbation requiring IV Lasix, acute on chronic hypoxic respiratory failure requiring increased supplemental oxygen*  Code Status : ful;l  Family Communication:    (patient is alert, awake and coherent)   Consults  :  na  DVT Prophylaxis  :  - Heparin - SCDs   Lab Results  Component Value Date   PLT 587 (H) 04/20/2020    Inpatient  Medications  Scheduled Meds: . aerochamber plus with mask  1 each Other Once  . fluticasone furoate-vilanterol  1 puff Inhalation Daily   And  . umeclidinium bromide  1 puff Inhalation Daily  . furosemide  40 mg Intravenous BID  . gabapentin  300 mg Oral BID  . guaiFENesin  600 mg Oral BID  . insulin aspart  0-15 Units Subcutaneous TID WC  . levalbuterol  1.25 mg Nebulization Q6H  . lidocaine  1 patch Transdermal Q24H  . methocarbamol  750 mg Oral Once  . methylPREDNISolone (SOLU-MEDROL) injection  40 mg Intravenous Q6H   Followed by  . [START ON 04/21/2020] predniSONE  40 mg Oral Q breakfast  . pantoprazole  40 mg Oral BID  . sodium chloride flush  3 mL Intravenous Once  . sodium chloride flush  3 mL Intravenous Q12H  . sucralfate  1 g Oral TID WC & HS   Continuous Infusions: . sodium chloride     PRN Meds:.sodium chloride, acetaminophen **OR** acetaminophen, albuterol, methocarbamol, ondansetron **OR** ondansetron (ZOFRAN) IV, sodium chloride flush, traMADol    Anti-infectives (From admission, onward)   None        Objective:   Vitals:   04/20/20 0854 04/20/20 0856 04/20/20 0918 04/20/20 1606  BP: 134/76   133/73  Pulse: 92   97  Resp: 19   20  Temp: 98.7 F (37.1 C)   98.4 F (36.9 C)  TempSrc: Oral   Oral  SpO2: 99%  99% 94%  Weight:  56.7 kg    Height:  _0  (1.6 m)      Wt Readings from Last 3 Encounters:  04/20/20 56.7 kg  04/16/20 55.3 kg  04/08/20 55.3 kg     Intake/Output Summary (Last 24 hours) at 04/20/2020 1923 Last data filed at 04/20/2020 1700 Gross per 24 hour  Intake 1.47 ml  Output 4150 ml  Net -4148.53 ml     Physical Exam  Gen:- Awake Alert,  In no apparent distress  HEENT:- [place].AT, No sclera icterus Nose- [place] 4L/min Neck-Supple Neck,No JVD,.  Lungs-diminished breath sounds, few scattered rales in bases, wheezes in upper lung fields CV- S1, S2 normal, regular  Abd-  +ve B.Sounds, Abd Soft, No tenderness,    Extremity/Skin:-Trace  edema, pedal pulses present  Psych-affect is appropriate, oriented x3 Neuro-generalized weakness, no new focal deficits, no tremors   Data Review:   Micro Results Recent Results (from the past 240 hour(s))  SARS CORONAVIRUS 2 (TAT 6-24 HRS) Nasopharyngeal Nasopharyngeal Swab     Status: None   Collection Time: 04/14/20  7:59 AM   Specimen: Nasopharyngeal Swab  Result Value Ref Range Status   SARS Coronavirus 2 NEGATIVE NEGATIVE Final    Comment: (NOTE) SARS-CoV-2 target nucleic acids are NOT DETECTED.  The SARS-CoV-2 RNA is generally detectable in upper and lower respiratory specimens during the acute phase of infection. Negative results do not preclude SARS-CoV-2 infection, do not rule out co-infections with other pathogens, and should not be  used as the sole basis for treatment or other patient management decisions. Negative results must be combined with clinical observations, patient history, and epidemiological information. The expected result is Negative.  Fact Sheet for Patients: SugarRoll.be  Fact Sheet for Healthcare Providers: https://www.woods-mathews.com/  This test is not yet approved or cleared by the [place] FDA and  has been authorized for detection and/or diagnosis of SARS-CoV-2 by FDA under an Emergency Use Authorization (EUA). This EUA will remain  in effect (meaning this test can be used) for the duration of the COVID-19 declaration under Se ction 564(b)(1) of the Act, 21 U.S.C. section 360bbb-3(b)(1), unless the authorization is terminated or revoked sooner.  Performed at [facility], [place] [address] [place], [place] [address]   SARS Coronavirus 2 by RT PCR (hospital order, performed in [facility] hospital lab) Nasopharyngeal Nasopharyngeal Swab     Status: None   Collection Time: 04/19/20  8:04 PM   Specimen: Nasopharyngeal Swab  Result Value Ref Range Status   SARS Coronavirus 2 NEGATIVE NEGATIVE  Final    Comment: (NOTE) SARS-CoV-2 target nucleic acids are NOT DETECTED.  The SARS-CoV-2 RNA is generally detectable in upper and lower respiratory specimens during the acute phase of infection. The lowest concentration of SARS-CoV-2 viral copies this assay can detect is 250 copies / mL. A negative result does not preclude SARS-CoV-2 infection and should not be used as the sole basis for treatment or other patient management decisions.  A negative result may occur with improper specimen collection / handling, submission of specimen other than nasopharyngeal swab, presence of viral mutation(s) within the areas targeted by this assay, and inadequate number of viral copies (<250 copies / mL). A negative result must be combined with clinical observations, patient history, and epidemiological information.  Fact Sheet for Patients:   StrictlyIdeas.no  Fact Sheet for Healthcare Providers: BankingDealers.co.za  This test is not yet approved or  cleared by the [place] FDA and has been authorized for detection and/or diagnosis of SARS-CoV-2 by FDA under an Emergency Use Authorization (EUA).  This EUA will remain in effect (meaning this test can be used) for the duration of the COVID-19 declaration under Section 564(b)(1) of the Act, 21 U.S.C. section 360bbb-3(b)(1), unless the authorization is terminated or revoked sooner.  Performed at [facility], [address] [place], [place] [address]     Radiology Reports DG Chest 2 View  Result Date: 04/19/2020 CLINICAL DATA:  Chest pain EXAM: CHEST - 2 VIEW COMPARISON:  04/01/2020 chest radiograph. FINDINGS: Partially visualized surgical hardware from ACDF. Stable cardiomediastinal silhouette with normal heart size. No pneumothorax. Chronic blunting of the costophrenic angles bilaterally. Hyperinflated lungs. No pulmonary edema. No acute consolidative airspace disease. IMPRESSION: 1.  Hyperinflated lungs, suggesting COPD. 2. No acute cardiopulmonary disease. Electronically Signed   By: Ilona Sorrel M.D.   On: 04/19/2020 14:11   DG Chest 2 View  Result Date: 04/01/2020 CLINICAL DATA:  Shortness of breath.  History of COPD. EXAM: CHEST - 2 VIEW COMPARISON:  03/30/2020 FINDINGS: The cardiomediastinal silhouette is unchanged with normal heart size. The lungs remain hyperinflated with unchanged peribronchial thickening. No confluent airspace opacity, edema, pleural effusion, pneumothorax is identified. Anterior cervical fusion is noted. IMPRESSION: Stable bronchitic changes. Electronically Signed   By: Chisenhall Bores M.D.   On: 04/01/2020 07:52   DG Chest 2 View  Result Date: 03/30/2020 CLINICAL DATA:  Chest pain and shortness of breath for several days EXAM: CHEST - 2 VIEW COMPARISON:  Radiograph 03/27/2020, CT 11/23/2019 FINDINGS: Mild airways thickening. No consolidation, features of edema, pneumothorax, or effusion. The cardiomediastinal contours are stable from priors. No acute osseous or soft tissue abnormality. Degenerative changes are present in the imaged spine and shoulders. Prior cervical fusion. Nasal cannula overlies the chest and base of the neck. IMPRESSION: Mild airways thickening could reflect an acute or chronic bronchitis or reactive airways disease. No acute consolidative opacity. Electronically Signed   By: Lovena Le M.D.   On: 03/30/2020 20:48   DG Chest 2 View  Result Date: 03/27/2020 CLINICAL DATA:  Chest pain. EXAM: CHEST - 2 VIEW COMPARISON:  March 21, 2020. FINDINGS: The heart size and mediastinal contours are within normal limits. Both lungs are clear. No pneumothorax or pleural effusion is noted. The visualized skeletal structures are unremarkable. IMPRESSION: No active cardiopulmonary disease. Electronically Signed   By: Marijo Conception M.D.   On: 03/27/2020 18:55   CT Angio Chest PE W and/or Wo Contrast  Result Date: 04/19/2020 CLINICAL DATA:  Short of  breath, RCA or pleural effusion suspected. EXAM: CT ANGIOGRAPHY CHEST WITH CONTRAST TECHNIQUE: Multidetector CT imaging of the chest was performed using the standard protocol during bolus administration of intravenous contrast. Multiplanar CT image reconstructions and MIPs were obtained to evaluate the vascular anatomy. CONTRAST:  67m OMNIPAQUE IOHEXOL 350 MG/ML SOLN COMPARISON:  None. FINDINGS: Cardiovascular: No filling defects within the pulmonary arteries to suggest acute pulmonary embolism. No acute findings aorta great vessels. No pericardial fluid. Mediastinum/Nodes: No axillary or supraclavicular adenopathy. No mediastinal adenopathy. No pericardial effusion. Esophagus normal. Borderline enlarged RIGHT perihilar lymph nodes measure up to 9 mm (image 57/4). No changed from comparison exam. Lungs/Pleura: No pulmonary infarction. No pneumonia. No pleural fluid. Interlobular septal thickening noted. Small band of atelectasis in the RIGHT lower lobe on image 86/8. Upper Abdomen: Limited view of the liver, kidneys, pancreas are unremarkable. Normal adrenal glands. Musculoskeletal: No aggressive osseous lesion. Review of the MIP images confirms the above findings. IMPRESSION: 1. No evidence acute pulmonary embolism. 2. Interlobular septal thickening suggests interstitial edema. 3. No pulmonary infarction, pneumonia or pleural fluid. 4. Small focus of atelectasis or infection RIGHT lower lobe. Electronically Signed   By: SSuzy BouchardM.D.   On: 04/19/2020 20:27   DG ABD ACUTE 2+V W 1V CHEST  Result Date: 04/08/2020 CLINICAL DATA:  Abdominal pain and distension EXAM: DG ABDOMEN ACUTE W/ 1V CHEST COMPARISON:  Chest radiograph December 02, 2019; CT abdomen and pelvis February 12, 2020 FINDINGS: PA chest: No edema or airspace opacity. Heart size and pulmonary vascularity are normal. No adenopathy. Supine and upright abdomen: Moderate stool noted in colon. No bowel dilatation or air-fluid level to suggest bowel  obstruction. No free air. Postoperative change on the left in the lower lumbar region. Probable small phlebolith inferior right pelvis. IMPRESSION: No bowel obstruction or free air. Moderate stool in colon. No edema or airspace opacity. Electronically Signed   By: WLowella GripIII M.D.   On: 04/08/2020 11:03     CBC Recent Labs  Lab 04/19/20 1423 04/20/20 0403  WBC 7.5 9.3  HGB 10.9* 10.9*  HCT 37.8* 38.1*  PLT 515* 587*  MCV 83.4 81.8  MCH 24.1* 23.4*  MCHC 28.8* 28.6*  RDW 19.1* 18.7*    Chemistries  Recent Labs  Lab 04/19/20 1423 04/20/20 0403  NA 142 141  K 4.6 3.9  CL 103 95*  CO2 30 32  GLUCOSE 106* 116*  BUN 16 15  CREATININE  1.56* 1.41*  CALCIUM 8.6* 8.8*   ------------------------------------------------------------------------------------------------------------------ No results for input(s): CHOL, HDL, LDLCALC, TRIG, CHOLHDL, LDLDIRECT in the last 72 hours.  Lab Results  Component Value Date   HGBA1C 6.7 (H) 02/12/2020   ------------------------------------------------------------------------------------------------------------------ No results for input(s): TSH, T4TOTAL, T3FREE, THYROIDAB in the last 72 hours.  Invalid input(s): FREET3 ------------------------------------------------------------------------------------------------------------------ No results for input(s): VITAMINB12, FOLATE, FERRITIN, TIBC, IRON, RETICCTPCT in the last 72 hours.  Coagulation profile No results for input(s): INR, PROTIME in the last 168 hours.  No results for input(s): DDIMER in the last 72 hours.  Cardiac Enzymes No results for input(s): CKMB, TROPONINI, MYOGLOBIN in the last 168 hours.  Invalid input(s): CK ------------------------------------------------------------------------------------------------------------------    Component Value Date/Time   BNP 987.0 (H) 04/19/2020 1551     Roxan Hockey M.D on 04/20/2020 at 7:23 PM  Go to [email or web address] -  for contact info  [facility] - Office  [phone]

## 2020-04-21 DIAGNOSIS — I5033 Acute on chronic diastolic (congestive) heart failure: Secondary | ICD-10-CM | POA: Diagnosis not present

## 2020-04-21 LAB — COMPREHENSIVE METABOLIC PANEL
ALT: 15 U/L (ref 0–44)
AST: 15 U/L (ref 15–41)
Albumin: 3 g/dL — ABNORMAL LOW (ref 3.5–5.0)
Alkaline Phosphatase: 75 U/L (ref 38–126)
Anion gap: 11 (ref 5–15)
BUN: 27 mg/dL — ABNORMAL HIGH (ref 8–23)
CO2: 33 mmol/L — ABNORMAL HIGH (ref 22–32)
Calcium: 8.5 mg/dL — ABNORMAL LOW (ref 8.9–10.3)
Chloride: 93 mmol/L — ABNORMAL LOW (ref 98–111)
Creatinine, Ser: 1.6 mg/dL — ABNORMAL HIGH (ref 0.61–1.24)
GFR calc Af Amer: 52 mL/min — ABNORMAL LOW (ref >60)
GFR calc non Af Amer: 45 mL/min — ABNORMAL LOW (ref >60)
Glucose, Bld: 143 mg/dL — ABNORMAL HIGH (ref 70–99)
Potassium: 4.2 mmol/L (ref 3.5–5.1)
Sodium: 137 mmol/L (ref 135–145)
Total Bilirubin: 0.5 mg/dL (ref 0.3–1.2)
Total Protein: 6.5 g/dL (ref 6.5–8.1)

## 2020-04-21 LAB — CBC
HCT: 35.4 % — ABNORMAL LOW (ref 39.0–52.0)
Hemoglobin: 10.5 g/dL — ABNORMAL LOW (ref 13.0–17.0)
MCH: 23.7 pg — ABNORMAL LOW (ref 26.0–34.0)
MCHC: 29.7 g/dL — ABNORMAL LOW (ref 30.0–36.0)
MCV: 79.9 fL — ABNORMAL LOW (ref 80.0–100.0)
Platelets: 560 10*3/uL — ABNORMAL HIGH (ref 150–400)
RBC: 4.43 MIL/uL (ref 4.22–5.81)
RDW: 18.5 % — ABNORMAL HIGH (ref 11.5–15.5)
WBC: 13.2 10*3/uL — ABNORMAL HIGH (ref 4.0–10.5)
nRBC: 0 % (ref 0.0–0.2)

## 2020-04-21 LAB — GLUCOSE, CAPILLARY
Glucose-Capillary: 130 mg/dL — ABNORMAL HIGH (ref 70–99)
Glucose-Capillary: 146 mg/dL — ABNORMAL HIGH (ref 70–99)
Glucose-Capillary: 158 mg/dL — ABNORMAL HIGH (ref 70–99)
Glucose-Capillary: 141 mg/dL — ABNORMAL HIGH (ref 70–99)

## 2020-04-21 LAB — MAGNESIUM: Magnesium: 1.6 mg/dL — ABNORMAL LOW (ref 1.7–2.4)

## 2020-04-21 MED ORDER — METHOCARBAMOL 500 MG PO TABS
500.0000 mg | ORAL_TABLET | Freq: Three times a day (TID) | ORAL | Status: DC
  Administered 2020-04-21 – 2020-04-22 (×4): 500 mg via ORAL
  Filled 2020-04-21 (×4): qty 1

## 2020-04-21 MED ORDER — HEPARIN SODIUM (PORCINE) 5000 UNIT/ML IJ SOLN
5000.0000 [IU] | Freq: Three times a day (TID) | INTRAMUSCULAR | Status: DC
  Administered 2020-04-21 – 2020-04-22 (×3): 5000 [IU] via SUBCUTANEOUS
  Filled 2020-04-21 (×4): qty 1

## 2020-04-21 NOTE — Evaluation (Signed)
Physical Therapy Evaluation Patient Details Name: Bryan [name] MRN: [id number] DOB: 31-May-1956 Today's Date: 04/21/2020   History of Present Illness  Bryan [name] is a 65 y.o. male with medical history significant of acid reflux, arthritis, asthma, chronic back pain, COPD, type 2 diabetes, hypertension, history of pneumonia, history of other nonhemorrhagic CVA/TIA, tubular adenoma chronic diastolic CHF who is coming to the emergency department with complaints of progressively worse dyspnea since yesterday evening associated with pleuritic chest pain, wheezing, fatigue, but denies productive or nonproductive cough.  Clinical Impression  Pt admitted with above diagnosis. Pt demonstrates good steadiness with ambulation, able to self propel O2 tank without difficulty, but is limited in distance due to SOB. Pt with mild SOB with ambulation, on 3L and desats to 86% while ambulating; able to recover to 93% within 1 minute of seated pursed lip breathing. Pt slightly below baseline demonstrating fatigue with mobility and without benefit from HHPT to improve strength, endurance and activity tolerance. Pt currently with functional limitations due to the deficits listed below (see PT Problem List). Pt will benefit from skilled PT to increase their independence and safety with mobility to allow discharge to the venue listed below.       Follow Up Recommendations Home health PT    Equipment Recommendations  None recommended by PT    Recommendations for Other Services       Precautions / Restrictions        Mobility  Bed Mobility Overal bed mobility: Independent   Transfers Overall transfer level: Independent Equipment used: None  General transfer comment: able to rise from EOB with BUE assisting to power up  Ambulation/Gait Ambulation/Gait assistance: Modified independent (Device/Increase time) Gait Distance (Feet): 84 Feet Assistive device:  (pushes O2 tank) Gait Pattern/deviations: WFL(Within  Functional Limits) Gait velocity: WNL   General Gait Details: pt ambulates around room, able to navigate O2 tank independently, no LOB or unsteadiness noted, mild SOB with SpO2 86% on 3L  Stairs            Wheelchair Mobility    Modified Rankin (Stroke Patients Only)       Balance Overall balance assessment: Independent            Pertinent Vitals/Pain Pain Assessment: No/denies pain    Home Living Family/patient expects to be discharged to:: Private residence Living Arrangements: Children (son) Available Help at Discharge: Family;Available PRN/intermittently Type of Home: House Home Access: Level entry     Home Layout: One level Home Equipment: Walker - 2 wheels;Cane - single point Additional Comments: Son works 1st shift    Prior Function Level of Independence: Independent         Comments: Pt reports ambulates household distances without AD, independent with bathing/dressing requiring increased time, son provides cooking, cleaning, laundry. Pt reports occasionally drives. Pt reports wearing O2 24/7 since March 2021. Pt denies recent falls. Pt reports "I give out when walking" meaning he gets tired and needs to sit and rest.     Hand Dominance   Dominant Hand: Right    Extremity/Trunk Assessment   Upper Extremity Assessment Upper Extremity Assessment: Defer to OT evaluation    Lower Extremity Assessment Lower Extremity Assessment: Overall WFL for tasks assessed (AROM WNL, strength 4/5, symmetrical)    Cervical / Trunk Assessment Cervical / Trunk Assessment: Normal  Communication   Communication: No difficulties  Cognition Arousal/Alertness: Awake/alert Behavior During Therapy: WFL for tasks assessed/performed Overall Cognitive Status: Within Functional Limits for tasks assessed  General Comments General comments (skin integrity, edema, etc.): on 3L with SpO2 86% with ambulation, recovers to 93% with seated pursed lip breathing within  1 minute    Exercises     Assessment/Plan    PT Assessment Patient needs continued PT services  PT Problem List Decreased activity tolerance;Cardiopulmonary status limiting activity       PT Treatment Interventions DME instruction;Gait training;Functional mobility training;Therapeutic activities;Therapeutic exercise;Balance training;Patient/family education    PT Goals (Current goals can be found in the Care Plan section)  Acute Rehab PT Goals Patient Stated Goal: get stronger PT Goal Formulation: With patient Time For Goal Achievement: 04/24/20 Potential to Achieve Goals: Good    Frequency Min 3X/week   Barriers to discharge        Co-evaluation               AM-PAC PT "6 Clicks" Mobility  Outcome Measure Help needed turning from your back to your side while in a flat bed without using bedrails?: None Help needed moving from lying on your back to sitting on the side of a flat bed without using bedrails?: None Help needed moving to and from a bed to a chair (including a wheelchair)?: None Help needed standing up from a chair using your arms (e.g., wheelchair or bedside chair)?: None Help needed to walk in hospital room?: None Help needed climbing 3-5 steps with a railing? : A Little 6 Click Score: 23    End of Session Equipment Utilized During Treatment: Oxygen Activity Tolerance: Patient tolerated treatment well Patient left: in chair;with call bell/phone within reach Nurse Communication: Mobility status;Other (comment) (O2) PT Visit Diagnosis: Other abnormalities of gait and mobility (R26.89)    Time: 4076-8088 PT Time Calculation (min) (ACUTE ONLY): 22 min   Charges:   PT Evaluation $PT Eval Low Complexity: 1 Low PT Treatments $Gait Training: 8-22 mins         Tori Lofton Leon PT, DPT 04/21/20, 12:06 PM [phone]

## 2020-04-21 NOTE — Evaluation (Signed)
Occupational Therapy Evaluation Patient Details Name: Bryan [name] MRN: [id number] DOB: 09-Oct-1955 Today's Date: 04/21/2020    History of Present Illness ARINZE RIVADENEIRA is a 64 y.o. male with medical history significant of acid reflux, arthritis, asthma, chronic back pain, COPD, type 2 diabetes, hypertension, history of pneumonia, history of other nonhemorrhagic CVA/TIA, tubular adenoma chronic diastolic CHF who is coming to the emergency department with complaints of progressively worse dyspnea since yesterday evening associated with pleuritic chest pain, wheezing, fatigue, but denies productive or nonproductive cough.   Clinical Impression   Pt agreeable to OT evaluation this am, reports slight improvement in SOB. Pt performing ADLs and mobility independently, no SOB during tasks. Pt educated on energy conservation strategies, verbalized understanding. No further OT services required at this time.     Follow Up Recommendations  No OT follow up    Equipment Recommendations  Tub/shower seat       Precautions / Restrictions Precautions Precautions: None Restrictions Weight Bearing Restrictions: No      Mobility Bed Mobility Overal bed mobility: Independent                Transfers Overall transfer level: Independent Equipment used: None                      ADL either performed or assessed with clinical judgement   ADL Overall ADL's : Modified independent;At baseline                                       General ADL Comments: pt performing ADLs independently     Vision Baseline Vision/History: No visual deficits Patient Visual Report: No change from baseline Vision Assessment?: No apparent visual deficits            Pertinent Vitals/Pain Pain Assessment: No/denies pain     Hand Dominance Right   Extremity/Trunk Assessment Upper Extremity Assessment Upper Extremity Assessment: Overall WFL for tasks assessed   Lower Extremity  Assessment Lower Extremity Assessment: Defer to PT evaluation   Cervical / Trunk Assessment Cervical / Trunk Assessment: Normal   Communication Communication Communication: No difficulties   Cognition Arousal/Alertness: Awake/alert Behavior During Therapy: WFL for tasks assessed/performed Overall Cognitive Status: Within Functional Limits for tasks assessed                                                Home Living Family/patient expects to be discharged to:: Private residence Living Arrangements: Children (son) Available Help at Discharge: Family;Available PRN/intermittently Type of Home: House Home Access: Level entry     Home Layout: One level     Bathroom Shower/Tub: Teacher, early years/pre: Standard     Home Equipment: Environmental consultant - 2 wheels;Cane - single point          Prior Functioning/Environment Level of Independence: Independent        Comments: Pt reports independence in mobility and ADLs        OT Problem List: Cardiopulmonary status limiting activity       End of Session Equipment Utilized During Treatment: Oxygen  Activity Tolerance: Patient tolerated treatment well Patient left: in bed;with call bell/phone within reach  OT Visit Diagnosis: Muscle weakness (generalized) (M62.81)  Time: 3009-2330 OT Time Calculation (min): 10 min Charges:  OT General Charges $OT Visit: 1 Visit OT Evaluation $OT Eval Low Complexity: [place], OTR/L  [phone] 04/21/2020, 8:23 AM

## 2020-04-21 NOTE — TOC Initial Note (Signed)
Transition of Care [facility]) - Initial/Assessment Note    Patient Details  Name: Bryan [name] MRN: [id number] Date of Birth: Dec 12, 1955  [facility] CM/SW Contact:    Salome Arnt, LCSW Phone Number: 04/21/2020, 8:50 AM  Clinical Narrative:  LCSW completed assessment due to high risk readmission score. Pt admitted due to acute on chronic diastolic congestive heart failure. He states he lives with his son, but he works during the day. Pt said, "I do the best I can at home." He has O2 through [employer]. PT evaluation pending. No OT follow up per evaluation. TOC will make hospital follow up appointment when pt is closer to d/c. Pt plans to return home when medically stable.                  Expected Discharge Plan: Home/Self Care Barriers to Discharge: Continued Medical Work up   Patient Goals and CMS Choice Patient states their goals for this hospitalization and ongoing recovery are:: return home      Expected Discharge Plan and Services Expected Discharge Plan: Home/Self Care In-house Referral: Clinical Social Work     Living arrangements for the past 2 months: Single Family Home                                      Prior Living Arrangements/Services Living arrangements for the past 2 months: Single Family Home Lives with:: Adult Children Patient language and need for interpreter reviewed:: Yes Do you feel safe going back to the place where you live?: Yes      Need for Family Participation in Patient Care: No (Comment) Care giver support system in place?: No (comment) Current home services: DME (cane, nebulizer, O2 ([employer])) Criminal Activity/Legal Involvement Pertinent to Current Situation/Hospitalization: No - Comment as needed  Activities of Daily Living Home Assistive Devices/Equipment: Cane (specify quad or straight), Oxygen, CBG Meter, Nebulizer, Blood pressure cuff ADL Screening (condition at time of admission) Patient's cognitive ability  adequate to safely complete daily activities?: Yes Is the patient deaf or have difficulty hearing?: No Does the patient have difficulty seeing, even when wearing glasses/contacts?: No Does the patient have difficulty concentrating, remembering, or making decisions?: No Patient able to express need for assistance with ADLs?: Yes Does the patient have difficulty dressing or bathing?: No Independently performs ADLs?: Yes (appropriate for developmental age) Does the patient have difficulty walking or climbing stairs?: Yes Weakness of Legs: Both Weakness of Arms/Hands: None  Permission Sought/Granted                  Emotional Assessment Appearance:: Appears stated age Attitude/Demeanor/Rapport: Engaged Affect (typically observed): Appropriate Orientation: : Oriented to Self, Oriented to Place, Oriented to  Time, Oriented to Situation Alcohol / Substance Use: Not Applicable Psych Involvement: No (comment)  Admission diagnosis:  Acute on chronic diastolic congestive heart failure (HCC) [I50.33] Respiratory failure with hypoxia, unspecified chronicity (HCC) [J96.91] Acute on chronic congestive heart failure, unspecified heart failure type ([place]) [I50.9] Acute exacerbation of CHF (congestive heart failure) ([place]) [I50.9] Patient Active Problem List   Diagnosis Date Noted  . Acute on chronic diastolic congestive heart failure ([place]) 04/19/2020  . Acute on chronic respiratory failure with hypoxia ([place]) 02/14/2020  . Diverticulitis of colon   . Abnormal CT scan, colon   . RUQ pain   . Acute on chronic diastolic CHF (congestive heart failure) ([place]) 02/12/2020  .  PUD (peptic ulcer disease) 01/28/2020  . Nonobstructive atherosclerosis of coronary artery   . Pulmonary hypertension due to COPD ([place])   . NSTEMI (non-ST elevated myocardial infarction) ([place]) 11/25/2019  . Non-insulin dependent type 2 diabetes mellitus ([place])   . Mixed hyperlipidemia   . Non-ST elevation (NSTEMI) myocardial  infarction ([place]) 11/24/2019  . Oxygen dependent 11/24/2019  . Former smoker 11/24/2019  . Coronary artery calcification seen on CAT scan   . Angina pectoris ([place]) 11/23/2019  . SOB (shortness of breath) 11/23/2019  . Chronic diastolic CHF (congestive heart failure) ([place]) 08/03/2019  . Acute on chronic respiratory failure ([place]) 08/02/2019  . Acute exacerbation of CHF (congestive heart failure) /HFpEF (EF 60%) 06/13/2019  . Normocytic anemia 02/15/2019  . COPD exacerbation ([place]) 09/16/2018  . Hypokalemia 06/26/2018  . Hypomagnesemia 06/26/2018  . Microcytic anemia 06/26/2018  . BPH (benign prostatic hyperplasia) 06/26/2018  . Chronic respiratory failure with hypoxia ([place]) 06/04/2018  . HCAP (healthcare-associated pneumonia) 06/04/2018  . Acute respiratory failure with hypoxia ([place]) 04/14/2018  . Pulmonary nodule 04/14/2018  . CKD (chronic kidney disease) stage 3, GFR 30-59 ml/min 04/14/2018  . Type 2 diabetes mellitus with hyperlipidemia ([place]) 03/12/2018  . BPH with obstruction/lower urinary tract symptoms 08/17/2017  . Left sided numbness 07/22/2017  . TIA (transient ischemic attack) 07/22/2017  . Pneumonia 07/14/2017  . COPD with acute exacerbation ([place]) 07/14/2017  . Constipation 07/10/2017  . Hx of adenomatous colonic polyps 05/01/2017  . Benign hypertension with CKD (chronic kidney disease) stage III   . Chronic pain   . Heme positive stool 10/20/2014  . Epigastric pain 06/19/2014  . Dysphagia 06/19/2014  . AP (abdominal pain) 11/13/2013  . Early satiety 11/13/2013  . COPD (chronic obstructive pulmonary disease) ([place]) 02/22/2013  . Low back pain 11/19/2012  . COLITIS 05/12/2009  . ABDOMINAL PAIN, LEFT LOWER QUADRANT 05/12/2009  . DUODENAL ULCER, HX OF 05/12/2009  . H/o Alcohol abuse- Quit in 2010 05/11/2009  . GERD 05/11/2009  . Diarrhea 05/11/2009  . SPONDYLOSIS, CERVICAL 07/03/2007  . NECK PAIN 07/03/2007   PCP:  Rosita Fire, MD Pharmacy:   [facility] [address] [place] [address] Phone: [phone] Fax: [phone]     Social Determinants of Health (SDOH) Interventions    Readmission Risk Interventions Readmission Risk Prevention Plan 04/21/2020 11/25/2019  Transportation Screening Complete Complete  PCP or Specialist Appt within 3-5 Days - Complete  HRI or [place] - Complete  Social Work Consult for [place] Planning/Counseling - Complete  Palliative Care Screening - Not Applicable  Medication Review Press photographer) Complete Complete  HRI or Home Care Consult Complete -  SW Recovery Care/Counseling Consult Complete -  Palliative Care Screening Not Applicable -  [place] Not Applicable -  Some recent data might be hidden

## 2020-04-21 NOTE — Progress Notes (Signed)
[name], is a 64 y.o. male, DOB - 1956/07/11, WLS:[id number]  Admit date - 04/19/2020   Admitting Physician Arrion Broaddus Denton Brick, MD  Outpatient Primary MD for the patient is Bryan Fire, MD  LOS - 1   Chief Complaint  Patient presents with  . Chest Pain        Subjective:    Bryan [name] today has no fevers, no emesis,  No chest pain, DOE persist --oxy sats dropped to 86 % on 3 liters while ambulating with phy Therapist today  Assessment  & Plan :    Principal Problem:   Acute on chronic diastolic congestive heart failure (HCC) Active Problems:   COPD (chronic obstructive pulmonary disease) (HCC)   Acute exacerbation of CHF (congestive heart failure) /HFpEF (EF 60%)   CKD (chronic kidney disease) stage 3, GFR 30-59 ml/min   GERD   Microcytic anemia   BPH (benign prostatic hyperplasia)   Non-insulin dependent type 2 diabetes mellitus (HCC)   Mixed hyperlipidemia   PUD (peptic ulcer disease)   Acute on chronic respiratory failure with hypoxia (HCC)  Brief Summary:- 64 y.o. male with medical history significant of acid reflux, arthritis, asthma, chronic back pain, COPD, type 2 diabetes, hypertension, history of pneumonia, history of other nonhemorrhagic CVA/TIA, tubular adenoma chronic diastolic CHF admitted on 04/13/1571 with acute on chronic hypoxic respiratory failure secondary to acute on chronic diastolic CHF exacerbation despite oral diuretics PTA  A/p 1) acute on chronic hypoxic respiratory failure in the setting of worsening diastolic dysfunction--- PTA patient used to be on 2 1/2 L of oxygen via nasal cannula required up to 4 L of oxygen upon presentation -Failed oral diuretics at home--anticipate oxygen requirement to improve with adequate diuresis - DOE persist --oxy sats dropped to 86 % on 3 liters while ambulating with phy Therapist today --- Patient needs  additional IV diuresis  2) acute on chronic diastolic dysfunction CHF--- continue IV diuresis, patient failed oral diuretics PTA, -Please see #1 above -Last known EF 55 to 60% on echo from 11/24/2019 =-Daily weight and fluid input and output monitoring as advised  3) CKD IIIa--- renal function appears to be at baseline, may have to tolerate some AKI/worsening renal function if need be to achieve adequate diuresis -Creatinine is up to 1.60 from 1.5 on admission --, renally adjust medications, avoid nephrotoxic agents / dehydration  / hypotension  4)Acute COPD exacerbation ---acute exacerbation symptoms persist, with DOE and cough - continue bronchodilators mucolytics and IV steroids  5)DM2--anticipate worsening glycemic control with steroids, Use Novolog/Humalog Sliding scale insulin with Accu-Cheks/Fingersticks as ordered   6)BPH--stable, continue Flomax  7) chronic anemia--- normocytic hypochromic--- anemia work-up in progress, hemoglobin currently at baseline --Query if probably related to underlying CKD  Disposition/Need for in-Hospital Stay- patient unable to be discharged at this time due to --acute COPD exacerbation requiring IV steroids, CHF exacerbation requiring IV Lasix, acute on chronic hypoxic respiratory failure requiring increased supplemental oxygen* -- DOE persist --oxy sats dropped to 86 % on 3 liters while ambulating with phy Therapist today --- Patient needs additional IV diuresis  Status is: Inpatient  Remains inpatient appropriate because:acute COPD exacerbation requiring IV steroids, CHF exacerbation requiring IV Lasix, acute on chronic hypoxic respiratory  failure requiring increased supplemental oxygen*   Disposition: The patient is from: Home              Anticipated d/c is to: Home              Anticipated d/c date is: 2 days              Patient currently is not medically stable to d/c. Barriers: Not Clinically Stable- acute COPD exacerbation requiring IV  steroids, CHF exacerbation requiring IV Lasix, acute on chronic hypoxic respiratory failure requiring increased supplemental oxygen* -- DOE persist --oxy sats dropped to 86 % on 3 liters while ambulating with phy Therapist today --- Patient needs additional IV diuresis  Code Status : full  Family Communication:    (patient is alert, awake and coherent)   Consults  :  na  DVT Prophylaxis  :  - Heparin - SCDs   Lab Results  Component Value Date   PLT 560 (H) 04/21/2020    Inpatient Medications  Scheduled Meds: . aerochamber plus with mask  1 each Other Once  . fluticasone furoate-vilanterol  1 puff Inhalation Daily   And  . umeclidinium bromide  1 puff Inhalation Daily  . furosemide  40 mg Intravenous BID  . gabapentin  300 mg Oral BID  . guaiFENesin  600 mg Oral BID  . insulin aspart  0-15 Units Subcutaneous TID WC  . levalbuterol  1.25 mg Nebulization TID  . lidocaine  1 patch Transdermal Q24H  . pantoprazole  40 mg Oral BID  . predniSONE  40 mg Oral Q breakfast  . sodium chloride flush  3 mL Intravenous Once  . sodium chloride flush  3 mL Intravenous Q12H  . sucralfate  1 g Oral TID WC & HS   Continuous Infusions: . sodium chloride     PRN Meds:.sodium chloride, acetaminophen **OR** acetaminophen, albuterol, methocarbamol, ondansetron **OR** ondansetron (ZOFRAN) IV, sodium chloride flush, traMADol    Anti-infectives (From admission, onward)   None        Objective:   Vitals:   04/20/20 1606 04/20/20 2011 04/20/20 2103 04/21/20 0505  BP: 133/73  137/74 134/83  Pulse: 97  (!) 101 95  Resp: _0 Temp: 98.4 F (36.9 C)  98.8 F (37.1 C) 98.8 F (37.1 C)  TempSrc: Oral     SpO2: 94% 97% 95% 98%  Weight:    54.2 kg  Height:        Wt Readings from Last 3 Encounters:  04/21/20 54.2 kg  04/16/20 55.3 kg  04/08/20 55.3 kg     Intake/Output Summary (Last 24 hours) at 04/21/2020 1250 Last data filed at 04/21/2020 1014 Gross per 24 hour  Intake 3  ml  Output 1150 ml  Net -1147 ml    Physical Exam  Gen:- Awake Alert,  In no apparent distress  HEENT:- [place].AT, No sclera icterus Nose- [place] 3L/min Neck-Supple Neck,   Lungs-diminished breath sounds, few scattered rales in bases, wheezes in upper lung fields CV- S1, S2 normal, regular  Abd-  +ve B.Sounds, Abd Soft, No tenderness,    Extremity/Skin:-Trace edema, pedal pulses present  Psych-affect is appropriate, oriented x3 Neuro-generalized weakness, no new focal deficits, no tremors   Data Review:   Micro Results Recent Results (from the past 240 hour(s))  SARS CORONAVIRUS 2 (TAT 6-24 HRS) Nasopharyngeal Nasopharyngeal Swab     Status: None   Collection Time: 04/14/20  7:59 AM   Specimen: Nasopharyngeal Swab  Result Value Ref Range Status   SARS Coronavirus 2 NEGATIVE NEGATIVE Final    Comment: (NOTE) SARS-CoV-2 target nucleic acids are NOT DETECTED.  The SARS-CoV-2 RNA is generally detectable in upper and lower respiratory specimens during the acute phase of infection. Negative results do not preclude SARS-CoV-2 infection, do not rule out co-infections with other pathogens, and should not be used as the sole basis for treatment or other patient management decisions. Negative results must be combined with clinical observations, patient history, and epidemiological information. The expected result is Negative.  Fact Sheet for Patients: SugarRoll.be  Fact Sheet for Healthcare Providers: https://www.woods-mathews.com/  This test is not yet approved or cleared by the [place] FDA and  has been authorized for detection and/or diagnosis of SARS-CoV-2 by FDA under an Emergency Use Authorization (EUA). This EUA will remain  in effect (meaning this test can be used) for the duration of the COVID-19 declaration under Se ction 564(b)(1) of the Act, 21 U.S.C. section 360bbb-3(b)(1), unless the authorization is terminated or revoked  sooner.  Performed at [facility], [place] [address], [place], [place] [address]   SARS Coronavirus 2 by RT PCR (hospital order, performed in [facility] hospital lab) Nasopharyngeal Nasopharyngeal Swab     Status: None   Collection Time: 04/19/20  8:04 PM   Specimen: Nasopharyngeal Swab  Result Value Ref Range Status   SARS Coronavirus 2 NEGATIVE NEGATIVE Final    Comment: (NOTE) SARS-CoV-2 target nucleic acids are NOT DETECTED.  The SARS-CoV-2 RNA is generally detectable in upper and lower respiratory specimens during the acute phase of infection. The lowest concentration of SARS-CoV-2 viral copies this assay can detect is 250 copies / mL. A negative result does not preclude SARS-CoV-2 infection and should not be used as the sole basis for treatment or other patient management decisions.  A negative result may occur with improper specimen collection / handling, submission of specimen other than nasopharyngeal swab, presence of viral mutation(s) within the areas targeted by this assay, and inadequate number of viral copies (<250 copies / mL). A negative result must be combined with clinical observations, patient history, and epidemiological information.  Fact Sheet for Patients:   StrictlyIdeas.no  Fact Sheet for Healthcare Providers: BankingDealers.co.za  This test is not yet approved or  cleared by the [place] FDA and has been authorized for detection and/or diagnosis of SARS-CoV-2 by FDA under an Emergency Use Authorization (EUA).  This EUA will remain in effect (meaning this test can be used) for the duration of the COVID-19 declaration under Section 564(b)(1) of the Act, 21 U.S.C. section 360bbb-3(b)(1), unless the authorization is terminated or revoked sooner.  Performed at [facility], [address] [place],  [address]     Radiology Reports DG Chest 2 View  Result Date: 04/19/2020 CLINICAL DATA:   Chest pain EXAM: CHEST - 2 VIEW COMPARISON:  04/01/2020 chest radiograph. FINDINGS: Partially visualized surgical hardware from ACDF. Stable cardiomediastinal silhouette with normal heart size. No pneumothorax. Chronic blunting of the costophrenic angles bilaterally. Hyperinflated lungs. No pulmonary edema. No acute consolidative airspace disease. IMPRESSION: 1. Hyperinflated lungs, suggesting COPD. 2. No acute cardiopulmonary disease. Electronically Signed   By: Ilona Sorrel M.D.   On: 04/19/2020 14:11   DG Chest 2 View  Result Date: 04/01/2020 CLINICAL DATA:  Shortness of breath.  History of COPD. EXAM: CHEST - 2 VIEW COMPARISON:  03/30/2020 FINDINGS: The cardiomediastinal silhouette is unchanged with normal heart size. The lungs remain hyperinflated with unchanged  peribronchial thickening. No confluent airspace opacity, edema, pleural effusion, pneumothorax is identified. Anterior cervical fusion is noted. IMPRESSION: Stable bronchitic changes. Electronically Signed   By: Mill Bores M.D.   On: 04/01/2020 07:52   DG Chest 2 View  Result Date: 03/30/2020 CLINICAL DATA:  Chest pain and shortness of breath for several days EXAM: CHEST - 2 VIEW COMPARISON:  Radiograph 03/27/2020, CT 11/23/2019 FINDINGS: Mild airways thickening. No consolidation, features of edema, pneumothorax, or effusion. The cardiomediastinal contours are stable from priors. No acute osseous or soft tissue abnormality. Degenerative changes are present in the imaged spine and shoulders. Prior cervical fusion. Nasal cannula overlies the chest and base of the neck. IMPRESSION: Mild airways thickening could reflect an acute or chronic bronchitis or reactive airways disease. No acute consolidative opacity. Electronically Signed   By: Lovena Le M.D.   On: 03/30/2020 20:48   DG Chest 2 View  Result Date: 03/27/2020 CLINICAL DATA:  Chest pain. EXAM: CHEST - 2 VIEW COMPARISON:  March 21, 2020. FINDINGS: The heart size and mediastinal  contours are within normal limits. Both lungs are clear. No pneumothorax or pleural effusion is noted. The visualized skeletal structures are unremarkable. IMPRESSION: No active cardiopulmonary disease. Electronically Signed   By: Marijo Conception M.D.   On: 03/27/2020 18:55   CT Angio Chest PE W and/or Wo Contrast  Result Date: 04/19/2020 CLINICAL DATA:  Short of breath, RCA or pleural effusion suspected. EXAM: CT ANGIOGRAPHY CHEST WITH CONTRAST TECHNIQUE: Multidetector CT imaging of the chest was performed using the standard protocol during bolus administration of intravenous contrast. Multiplanar CT image reconstructions and MIPs were obtained to evaluate the vascular anatomy. CONTRAST:  61m OMNIPAQUE IOHEXOL 350 MG/ML SOLN COMPARISON:  None. FINDINGS: Cardiovascular: No filling defects within the pulmonary arteries to suggest acute pulmonary embolism. No acute findings aorta great vessels. No pericardial fluid. Mediastinum/Nodes: No axillary or supraclavicular adenopathy. No mediastinal adenopathy. No pericardial effusion. Esophagus normal. Borderline enlarged RIGHT perihilar lymph nodes measure up to 9 mm (image 57/4). No changed from comparison exam. Lungs/Pleura: No pulmonary infarction. No pneumonia. No pleural fluid. Interlobular septal thickening noted. Small band of atelectasis in the RIGHT lower lobe on image 86/8. Upper Abdomen: Limited view of the liver, kidneys, pancreas are unremarkable. Normal adrenal glands. Musculoskeletal: No aggressive osseous lesion. Review of the MIP images confirms the above findings. IMPRESSION: 1. No evidence acute pulmonary embolism. 2. Interlobular septal thickening suggests interstitial edema. 3. No pulmonary infarction, pneumonia or pleural fluid. 4. Small focus of atelectasis or infection RIGHT lower lobe. Electronically Signed   By: SSuzy BouchardM.D.   On: 04/19/2020 20:27   DG ABD ACUTE 2+V W 1V CHEST  Result Date: 04/08/2020 CLINICAL DATA:  Abdominal pain  and distension EXAM: DG ABDOMEN ACUTE W/ 1V CHEST COMPARISON:  Chest radiograph December 02, 2019; CT abdomen and pelvis February 12, 2020 FINDINGS: PA chest: No edema or airspace opacity. Heart size and pulmonary vascularity are normal. No adenopathy. Supine and upright abdomen: Moderate stool noted in colon. No bowel dilatation or air-fluid level to suggest bowel obstruction. No free air. Postoperative change on the left in the lower lumbar region. Probable small phlebolith inferior right pelvis. IMPRESSION: No bowel obstruction or free air. Moderate stool in colon. No edema or airspace opacity. Electronically Signed   By: WLowella GripIII M.D.   On: 04/08/2020 11:03     CBC Recent Labs  Lab 04/19/20 1423 04/20/20 0403 04/21/20 0428  WBC 7.5 9.3 13.2*  HGB 10.9* 10.9* 10.5*  HCT 37.8* 38.1* 35.4*  PLT 515* 587* 560*  MCV 83.4 81.8 79.9*  MCH 24.1* 23.4* 23.7*  MCHC 28.8* 28.6* 29.7*  RDW 19.1* 18.7* 18.5*    Chemistries  Recent Labs  Lab 04/19/20 1423 04/20/20 0403 04/21/20 0428  NA 142 141 137  K 4.6 3.9 4.2  CL 103 95* 93*  CO2 30 32 33*  GLUCOSE 106* 116* 143*  BUN 16 15 27*  CREATININE 1.56* 1.41* 1.60*  CALCIUM 8.6* 8.8* 8.5*  MG  --   --  1.6*  AST  --   --  15  ALT  --   --  15  ALKPHOS  --   --  75  BILITOT  --   --  0.5   No results for input(s): CHOL, HDL, LDLCALC, TRIG, CHOLHDL, LDLDIRECT in the last 72 hours.  Lab Results  Component Value Date   HGBA1C 6.7 (H) 02/12/2020   ------------------------------------------------------------------------------------------------------------------ No results for input(s): TSH, T4TOTAL, T3FREE, THYROIDAB in the last 72 hours.  Invalid input(s): FREET3 ------------------------------------------------------------------------------------------------------------------ No results for input(s): VITAMINB12, FOLATE, FERRITIN, TIBC, IRON, RETICCTPCT in the last 72 hours.  Coagulation profile No results for input(s): INR,  PROTIME in the last 168 hours.  No results for input(s): DDIMER in the last 72 hours.  Cardiac Enzymes No results for input(s): CKMB, TROPONINI, MYOGLOBIN in the last 168 hours.  Invalid input(s): CK ------------------------------------------------------------------------------------------------------------------    Component Value Date/Time   BNP 987.0 (H) 04/19/2020 1551     Roxan Hockey M.D on 04/21/2020 at 12:50 PM  Go to [email or web address] - for contact info  [facility] - Office  [phone]

## 2020-04-21 NOTE — Plan of Care (Signed)
  Problem: Acute Rehab PT Goals(only PT should resolve) Goal: Pt Will Ambulate Outcome: Progressing Flowsheets (Taken 04/21/2020 1209) Pt will Ambulate:  > 125 feet  with modified independence Goal: Pt/caregiver will Perform Home Exercise Program Outcome: Progressing Flowsheets (Taken 04/21/2020 1209) Pt/caregiver will Perform Home Exercise Program: (for improved activity tolerance)  For increased strengthening  Independently   Tori Bosten Newstrom PT, DPT 04/21/20, 12:09 PM [phone]

## 2020-04-21 NOTE — Progress Notes (Addendum)
Initial Nutrition Assessment  DOCUMENTATION CODES:      INTERVENTION:  Recommend adding Heart Healthy to CHO modified restriction to lower sodium intake. He is on a 1200 ml fluid restriction.  Heart Failure Nutrition Therapy education provided   NUTRITION DIAGNOSIS:   Food and nutrition related knowledge deficit related to chronic illness (acute on chronic HF) as evidenced by  (Diet recall- multiple high sodium foods daily and frequently eats out).   GOAL:   (Patient will adopt a low sodium eating pattern going forward)   MONITOR:  Diet compliance, Po intake, labs and wt trends     REASON FOR ASSESSMENT:  Consult Assessment of nutrition requirement/status   ASSESSMENT: Patient is a 63 yo male with history of COPD, DM, Hypertension, Stroke, NSTEMI, Acid reflux, PUD and CKD-3. He presents with acute on chronic CHF. Diuretic therapy and home O2 prior to admission. Patient receiving IV lasix and increased oxygen needs. Patient up in chair during RD visit. He has a good appetite and has been eating well. At home he frequently eats out. He likes fish, chicken, Pork, potatoes, vegetables and at breakfast he likes pancakes, oatmeal, sausage egg cheese biscuit and occasional ham biscuit. Provided Heart Failure Nutrition Therapy handout and reviewed with him high sodium foods and talked about better choices when eating in restaurants. Encouraged him to use Ms Deliah Boston for flavor instead of adding salt to foods. Patient agreeable and participated in discussion of diet recommendations.    Medications reviewed and include: Lasix, Novolog, Gabapentin, Protonix, Sucralfate   Weight has ranged between 54-56 kg the past 2 months.  Intake/Output Summary (Last 24 hours) at 04/21/2020 1339 Last data filed at 04/21/2020 1014 Gross per 24 hour  Intake 3 ml  Output 1150 ml  Net -1147 ml     Labs: BMP Latest Ref Rng & Units 04/21/2020 04/20/2020 04/19/2020  Glucose 70 - 99 mg/dL 143(H) 116(H) 106(H)  BUN  8 - 23 mg/dL 27(H) 15 16  Creatinine 0.61 - 1.24 mg/dL 1.60(H) 1.41(H) 1.56(H)  BUN/Creat Ratio 6 - 22 (calc) - - -  Sodium 135 - 145 mmol/L 137 141 142  Potassium 3.5 - 5.1 mmol/L 4.2 3.9 4.6  Chloride 98 - 111 mmol/L 93(L) 95(L) 103  CO2 22 - 32 mmol/L 33(H) 32 30  Calcium 8.9 - 10.3 mg/dL 8.5(L) 8.8(L) 8.6(L)     NUTRITION - FOCUSED PHYSICAL EXAM: Deferred to follow up   Diet Order:   Diet Order            Diet Carb Modified Fluid consistency: Thin; Room service appropriate? Yes; Fluid restriction: 1200 mL Fluid  Diet effective now                 EDUCATION NEEDS:  Education needs have been addressed  Skin:  Skin Assessment: Reviewed RN Assessment  Last BM:  8/8  Height:   Ht Readings from Last 1 Encounters:  04/20/20 _0  (1.6 m)    Weight:   Wt Readings from Last 1 Encounters:  04/21/20 54.2 kg    Ideal Body Weight:   56 kg  BMI:  Body mass index is 21.17 kg/m.  Estimated Nutritional Needs:   Kcal:   1800-1900  Protein:   65-70 gr protein  Fluid:   1200 ml daily per MD  Colman Cater MS,RD,CSG,LDN Pager: #available through [facility]

## 2020-04-22 DIAGNOSIS — I5033 Acute on chronic diastolic (congestive) heart failure: Secondary | ICD-10-CM | POA: Diagnosis not present

## 2020-04-22 LAB — BASIC METABOLIC PANEL
Anion gap: 13 (ref 5–15)
BUN: 33 mg/dL — ABNORMAL HIGH (ref 8–23)
CO2: 33 mmol/L — ABNORMAL HIGH (ref 22–32)
Calcium: 8.4 mg/dL — ABNORMAL LOW (ref 8.9–10.3)
Chloride: 90 mmol/L — ABNORMAL LOW (ref 98–111)
Creatinine, Ser: 1.63 mg/dL — ABNORMAL HIGH (ref 0.61–1.24)
GFR calc Af Amer: 51 mL/min — ABNORMAL LOW (ref >60)
GFR calc non Af Amer: 44 mL/min — ABNORMAL LOW (ref >60)
Glucose, Bld: 97 mg/dL (ref 70–99)
Potassium: 3.7 mmol/L (ref 3.5–5.1)
Sodium: 136 mmol/L (ref 135–145)

## 2020-04-22 LAB — GLUCOSE, CAPILLARY
Glucose-Capillary: 112 mg/dL — ABNORMAL HIGH (ref 70–99)
Glucose-Capillary: 135 mg/dL — ABNORMAL HIGH (ref 70–99)
Glucose-Capillary: 169 mg/dL — ABNORMAL HIGH (ref 70–99)

## 2020-04-22 MED ORDER — GUAIFENESIN ER 600 MG PO TB12: 600.0000 mg | ORAL_TABLET | Freq: Two times a day (BID) | ORAL | 2 refills | Status: AC

## 2020-04-22 MED ORDER — TORSEMIDE 20 MG PO TABS: 20.0000 mg | ORAL_TABLET | Freq: Two times a day (BID) | ORAL | 2 refills | Status: AC

## 2020-04-22 MED ORDER — SUCRALFATE 1 GM/10ML PO SUSP: 1.0000 g | Freq: Three times a day (TID) | ORAL | 3 refills | Status: DC

## 2020-04-22 MED ORDER — ACETAMINOPHEN 325 MG PO TABS: 650.0000 mg | ORAL_TABLET | Freq: Four times a day (QID) | ORAL | 0 refills | Status: AC | PRN

## 2020-04-22 MED ORDER — ALBUTEROL SULFATE (2.5 MG/3ML) 0.083% IN NEBU: 2.5000 mg | INHALATION_SOLUTION | RESPIRATORY_TRACT | 5 refills | Status: AC | PRN

## 2020-04-22 MED ORDER — ALBUTEROL SULFATE HFA 108 (90 BASE) MCG/ACT IN AERS
2.0000 | INHALATION_SPRAY | RESPIRATORY_TRACT | 2 refills | Status: AC | PRN
Start: 2020-04-22 — End: ?

## 2020-04-22 NOTE — Progress Notes (Signed)
Pt discharged via WC to POV with all personal belongings in his possession. Pt on O2 at 3 lpm [place] at discharge via his own personal O2 tank. Pt had stated that he was out of carafate at home. MD Courage notified and stated he would call in refill for patient. Pt notified and stated understanding.

## 2020-04-22 NOTE — Discharge Summary (Signed)
Bryan [name], is a 64 y.o. male  DOB 1956/05/12  MRN [id number].  Admission date:  04/19/2020  Admitting Physician  Roxan Hockey, MD  Discharge Date:  04/22/2020   Primary MD  Rosita Fire, MD  Recommendations for primary care physician for things to follow:    1)Very low-salt diet advised 2)Weigh yourself daily, call if you gain more than 3 pounds in 1 day or more than 5 pounds in 1 week as your diuretic medications may need to be adjusted 3)Limit your Fluid  intake to no more than 60 ounces (1.8 Liters) per day 4)Avoid ibuprofen/Advil/Aleve/Motrin/Goody Powders/Naproxen/BC powders/Meloxicam/Diclofenac/Indomethacin and other Nonsteroidal anti-inflammatory medications as these will make you more likely to bleed and can cause stomach ulcers, can also cause Kidney problems.  --- You are strongly advised again to STOP ibuprofen and similar medications 5)Repeat BMP blood test with the primary care physician within a week advised 6)You need oxygen at home at 2.5  L via nasal cannula continuously while awake and while asleep--- smoking or having open fires around oxygen can cause fire, significant injury and death  Admission Diagnosis  Acute on chronic diastolic congestive heart failure ([place]) [I50.33] Respiratory failure with hypoxia, unspecified chronicity ([place]) [J96.91] Acute on chronic congestive heart failure, unspecified heart failure type ([place]) [I50.9] Acute exacerbation of CHF (congestive heart failure) ([place]) [I50.9]   Discharge Diagnosis  Acute on chronic diastolic congestive heart failure ([place]) [I50.33] Respiratory failure with hypoxia, unspecified chronicity ([place]) [J96.91] Acute on chronic congestive heart failure, unspecified heart failure type ([place]) [I50.9] Acute exacerbation of CHF (congestive heart failure) ([place]) [I50.9]    Principal Problem:   Acute on chronic diastolic congestive heart  failure (HCC) Active Problems:   COPD (chronic obstructive pulmonary disease) (HCC)   Acute exacerbation of CHF (congestive heart failure) /HFpEF (EF 60%)   CKD (chronic kidney disease) stage 3, GFR 30-59 ml/min   GERD   Microcytic anemia   BPH (benign prostatic hyperplasia)   Non-insulin dependent type 2 diabetes mellitus ([place])   Mixed hyperlipidemia   PUD (peptic ulcer disease)   Acute on chronic respiratory failure with hypoxia (HCC)      Past Medical History:  Diagnosis Date  . Acid reflux   . Arthritis   . Asthma   . Chronic pain    with leg and back pain (disc problem)  . COPD (chronic obstructive pulmonary disease) ([place])   . Diabetes mellitus without complication ([place])   . Headache    HX OF  . History of upper GI x-ray series    to follow showed large duodenal ulcer H pylori serologies were negative  . Hypertension   . NSTEMI (non-ST elevated myocardial infarction) ([place]) 11/2019  . Pneumonia    07/17/17  . Pre-diabetes   . Stroke [facility])    TIA MINI STROKE  . Tubular adenoma     Past Surgical History:  Procedure Laterality Date  . BACK SURGERY  7/05; 5/09    Dr.Hirsch,3 lumbar  X3  . BACK SURGERY    .  COLONOSCOPY  Feb 2012   Dr. Gala Romney: normal rectum, pedunculate polyp removed but not recovered  . COLONOSCOPY WITH ESOPHAGOGASTRODUODENOSCOPY (EGD) N/A 11/18/2013   Dr.Rourk- tcs= normal rectum, multipe polyps about the ileocecal valve and distal transverse colon o/w the remainder of the colonic mucosa appeared normal bx= tubular adenoma. EGD= normal esophagus, stomach with scattered erosions mottling, friablility, no ulcer or infiltrating process patent pylorus bx= chronic inflammation. next TCS 11/2016  . COLONOSCOPY WITH PROPOFOL N/A 05/25/2017   Dr. Gala Romney: sigmoid diverticulosis, one 4 mm hyerplastic rectal polyp, ascending colonic AVMs surveillance 2023  . COLONOSCOPY WITH PROPOFOL N/A 04/16/2020   Procedure: COLONOSCOPY WITH PROPOFOL;  Surgeon: Daneil Dolin, MD;   [facility];  Service: Endoscopy;  Laterality: N/A;  . ESOPHAGOGASTRODUODENOSCOPY  1/06   Dr. Rourk:Distal esophageal erosions,U-shaped stomach,marked erosions and edema of the bulb without discrete ulcer disease.   . ESOPHAGOGASTRODUODENOSCOPY (EGD) WITH PROPOFOL N/A 05/25/2017   Dr. Gala Romney: reflux esophagitis s/p empiric dilation, normal stomach and duodenum  . ESOPHAGOGASTRODUODENOSCOPY (EGD) WITH PROPOFOL Left 11/30/2019   LA Grade D esophagitis, small hiatal hernia, one non-bleeding cratered gastric ulcer in prepyloric region of stomach, many non-bleeding cratered duodenal ulcers without stigmata of bleeding, mild luminal narrowing at apex of duodenal bulb but traversed easily. H.pylori serology negative.  . ESOPHAGOGASTRODUODENOSCOPY (EGD) WITH PROPOFOL N/A 04/16/2020   Procedure: ESOPHAGOGASTRODUODENOSCOPY (EGD) WITH PROPOFOL;  Surgeon: Daneil Dolin, MD;  [facility];  Service: Endoscopy;  Laterality: N/A;  . Venia Minks DILATION N/A 05/25/2017   Procedure: Venia Minks DILATION;  Surgeon: Daneil Dolin, MD;  [facility];  Service: Endoscopy;  Laterality: N/A;  . NECK SURGERY  10/2007   PLATE IN NECK  . POLYPECTOMY  05/25/2017   Procedure: POLYPECTOMY;  Surgeon: Daneil Dolin, MD;  [facility];  Service: Endoscopy;;  rectal  . RIGHT/LEFT HEART CATH AND CORONARY ANGIOGRAPHY N/A 12/03/2019   Procedure: RIGHT/LEFT HEART CATH AND CORONARY ANGIOGRAPHY;  Surgeon: Nigel Mormon, MD;  Location: [facility];  Service: Cardiovascular;  Laterality: N/A;  . TRANSURETHRAL RESECTION OF PROSTATE N/A 08/17/2017   Procedure: TRANSURETHRAL RESECTION OF THE PROSTATE (TURP);  Surgeon: Irine Seal, MD;  Location: [facility];  Service: Urology;  Laterality: N/A;     HPI  from the history and physical done on the day of admission:    Chief Complaint: Shortness of breath.  HPI: Bryan [name] is a 64 y.o. male with medical history significant of acid reflux,  arthritis, asthma, chronic back pain, COPD, type 2 diabetes, hypertension, history of pneumonia, history of other nonhemorrhagic CVA/TIA, tubular adenoma chronic diastolic CHF who is coming to the emergency department with complaints of progressively worse dyspnea since yesterday evening associated with pleuritic chest pain, wheezing, fatigue, but denies productive or nonproductive cough. No fever, chills, hemoptysis, sore throat or rhinorrhea. No sick contacts travel history. No anginal CP, palpitations, diaphoresis, PND, orthopnea or pitting edema of the lower extremities. No nausea, emesis, diarrhea, constipation, melena or hematochezia, dysuria, hematuria, polydipsia, polyphagia or polyuria.  The patient has been eating foods with a high content of sodium lately.  ED Course: Initial vital signs were temperature 98.8 F, pulse 81, respirations 24, blood pressure 128/88 mmHg and O2 sat 90% on 3 L nasal cannula.  The patient received 60 mg of furosemide and 4 puffs of albuterol MDI.  I added levalbuterol, Solu-Medrol 125 mg IVP x1, morphine 4 mg IVP x1 and magnesium sulfate 2 g IVPB.  CBC shows a white count 7.5, hemoglobin 7.9 g/dL and platelets 515.  Troponin was normal x2.  BNP was 987 pg/mL.  BMP shows normal electrolytes, except for calcium of 8.6.  Glucose 107, BUN 16 creatinine 1.56 mg/dL.  His chest radiograph show hyperinflated lungs without acute cardiopulmonary disease.  CTA no evidence of acute pulmonary embolism.  There was interlobar septal thickening suggesting interstitial edema.  There was no infarction, pneumonia or pleural fluid.  There is small focus atelectasis or infection of the right lower lobe.  Please see images and full evaluation report for further details.     Hospital Course:   Brief Summary:- 64 y.o.malewith medical history significant ofacid reflux, arthritis, asthma, chronic back pain, COPD, type 2 diabetes, hypertension, history of pneumonia, history ofother  nonhemorrhagic CVA/TIA, tubular adenomachronic diastolic CHF admitted on 10/19/5168 with acute on chronic hypoxic respiratory failure secondary to acute on chronic diastolic CHF exacerbation despite oral diuretics PTA  A/p 1) acute on chronic hypoxic respiratory failure in the setting of worsening diastolic dysfunction--- PTA patient used to be on 2 1/2 L of oxygen via nasal cannula required up to 4 L of oxygen upon presentation -Failed oral diuretics at home-  oxygen requirement improved with additional diuresis  --Oxygen requirement appears to be back to baseline at this time  -Patient states no dyspnea at rest, dyspnea on exertion back to his usual baseline -  2) acute on chronic diastolic dysfunction CHF---  patient failed oral diuretics PTA, -Please see #1 above =-Diuresed well with IV Lasix, patient feels like he is pretty close to his baseline -Last known EF 55 to 60% on echo from 11/24/2019 -Discharge home on p.o. torsemide -Fluid and salt restriction advised  3) CKD IIIa--- renal function appears to be at baseline, may have to tolerate some AKI/worsening renal function if need be to achieve adequate diuresis -Creatinine is up to 1.60 from 1.5 on admission --, renally adjust medications, avoid nephrotoxic agents / dehydration  / hypotension  4)Acute COPD exacerbation --- overall much improved with IV steroids, bronchodilators and mucolytics -Supplemental oxygen as above #1  5)DM2--should improve off steroids, dietary modifications discussed   6)BPH--stable, continue Flomax  7) chronic anemia--- normocytic hypochromic--- --Query if probably related to underlying CKD  Disposition--- discharge home with oxygen and po  torsemide   Code Status : full  Family Communication:    (patient is alert, awake and coherent)   Consults  :  na   Discharge Condition: Stable  Follow UP   Follow-up Information    Rosita Fire, MD. Go on 04/27/2020.   Specialty: Internal  Medicine Why: 10:00 appointment Contact information: [place] [address] [phone]        [facility] Follow [facility].   Specialty: Home Health Services Why: Will follow up with you to start home health physical therapy.                Consults obtained -na  Diet and Activity recommendation:  As advised  Discharge Instructions    Discharge Instructions    Call MD for:  difficulty breathing, headache or visual disturbances   Complete by: As directed    Call MD for:  persistant dizziness or light-headedness   Complete by: As directed    Call MD for:  persistant nausea and vomiting   Complete by: As directed    Call MD for:  temperature >100.4   Complete by: As directed    Diet - low  sodium heart healthy   Complete by: As directed    Discharge instructions   Complete by: As directed    1)Very low-salt diet advised 2)Weigh yourself daily, call if you gain more than 3 pounds in 1 day or more than 5 pounds in 1 week as your diuretic medications may need to be adjusted 3)Limit your Fluid  intake to no more than 60 ounces (1.8 Liters) per day 4)Avoid ibuprofen/Advil/Aleve/Motrin/Goody Powders/Naproxen/BC powders/Meloxicam/Diclofenac/Indomethacin and other Nonsteroidal anti-inflammatory medications as these will make you more likely to bleed and can cause stomach ulcers, can also cause Kidney problems.  --- You are strongly advised again to STOP ibuprofen and similar medications 5) repeat BMP blood test with the primary care physician within a week advised 6)you need oxygen at home at 2.5  L via nasal cannula continuously while awake and while asleep--- smoking or having open fires around oxygen can cause fire, significant injury and death   For home use only DME oxygen   Complete by: As directed    SATURATION QUALIFICATIONS: (Thisnote is usedto comply with regulatory documentation for home oxygen)  Patient Saturations on Room Air  at Rest =88 %  Patient Saturations on Room Air while Ambulating =84 %  Patient Saturations on2.5Liters of oxygen while Ambulating = 93 %  Patient needs continuous O2 at 2.5 L/min with HUMIDIFIER continuously via nasal cannula with humidifier, with gaseous portability and conserving device    - Congestive heart failure and COPD exacerbation with generalized weakness, deconditioning and CHRONIC hypoxic respiratory failure   Length of Need: Lifetime   Mode or (Route): Nasal cannula   Liters per Minute: 2.5   Frequency: Continuous (stationary and portable oxygen unit needed)   Oxygen conserving device: Yes   Oxygen delivery system: Gas   Increase activity slowly   Complete by: As directed         Discharge Medications     Allergies as of 04/22/2020   No Known Allergies     Medication List    STOP taking these medications   furosemide 40 MG tablet Commonly known as: LASIX   ibuprofen 800 MG tablet Commonly known as: ADVIL   predniSONE 20 MG tablet Commonly known as: DELTASONE     TAKE these medications   acetaminophen 325 MG tablet Commonly known as: TYLENOL Take 2 tablets (650 mg total) by mouth every 6 (six) hours as needed for mild pain, fever or headache (or Fever >/= 101). What changed:   medication strength  how much to take  reasons to take this   albuterol 108 (90 Base) MCG/ACT inhaler Commonly known as: VENTOLIN HFA Inhale 2 puffs into the lungs every 4 (four) hours as needed for wheezing or shortness of breath. Do not use with nebulizer   albuterol (2.5 MG/3ML) 0.083% nebulizer solution Commonly known as: PROVENTIL Take 3 mLs (2.5 mg total) by nebulization every 4 (four) hours as needed for wheezing or shortness of breath.   calcium carbonate 500 MG chewable tablet Commonly known as: TUMS - dosed in mg elemental calcium Chew 2 tablets (400 mg of elemental calcium total) by mouth 3 (three) times daily as needed for indigestion or  heartburn.   gabapentin 300 MG capsule Commonly known as: NEURONTIN Take 1 capsule (300 mg total) by mouth 2 (two) times daily. Pt. Says he is taking twice daily. What changed:   how much to take  when to take this  additional instructions   guaiFENesin 600 MG 12 hr tablet Commonly known  as: Mucinex Take 1 tablet (600 mg total) by mouth 2 (two) times daily. What changed: when to take this   lidocaine 5 % Commonly known as: Lidoderm Place 1 patch onto the skin daily. Remove & Discard patch within 12 hours or as directed by MD   linaclotide 72 MCG capsule Commonly known as: Linzess Take 1 capsule (72 mcg total) by mouth daily before breakfast. What changed:   when to take this  reasons to take this   magnesium oxide 400 MG tablet Commonly known as: MAG-OX Take 400 mg by mouth daily.   methocarbamol 500 MG tablet Commonly known as: ROBAXIN Take 1 tablet (500 mg total) by mouth every 8 (eight) hours as needed for muscle spasms.   metoprolol tartrate 25 MG tablet Commonly known as: LOPRESSOR Take 0.5 tablets (12.5 mg total) by mouth 2 (two) times daily.   OXYGEN Inhale 2.5 L into the lungs daily.   pantoprazole 40 MG tablet Commonly known as: PROTONIX Take 1 tablet (40 mg total) by mouth 2 (two) times daily before a meal. What changed: when to take this   sucralfate 1 GM/10ML suspension Commonly known as: Carafate Take 10 mLs (1 g total) by mouth 4 (four) times daily -  with meals and at bedtime.   torsemide 20 MG tablet Commonly known as: Demadex Take 1 tablet (20 mg total) by mouth 2 (two) times daily. For Fluid/Heart   traMADol 50 MG tablet Commonly known as: ULTRAM Take 1 tablet (50 mg total) by mouth every 6 (six) hours as needed.   Trelegy Ellipta 100-62.5-25 MCG/INH Aepb Generic drug: Fluticasone-Umeclidin-Vilant Inhale 1 puff into the lungs daily.            Durable Medical Equipment  (From admission, onward)         Start     Ordered    04/22/20 1504  For home use only DME 3 n 1  Once       Comments: Congestive heart failure and COPD exacerbation with generalized weakness, deconditioning and hypoxic respiratory failure   04/22/20 1505   04/22/20 0000  For home use only DME oxygen       Comments: SATURATION QUALIFICATIONS: (Thisnote is usedto comply with regulatory documentation for home oxygen)  Patient Saturations on Room Air at Rest =88 %  Patient Saturations on Room Air while Ambulating =84 %  Patient Saturations on2.5Liters of oxygen while Ambulating = 93 %  Patient needs continuous O2 at 2.5 L/min with HUMIDIFIER continuously via nasal cannula with humidifier, with gaseous portability and conserving device    - Congestive heart failure and COPD exacerbation with generalized weakness, deconditioning and CHRONIC hypoxic respiratory failure  Question Answer Comment  Length of Need Lifetime   Mode or (Route) Nasal cannula   Liters per Minute 2.5   Frequency Continuous (stationary and portable oxygen unit needed)   Oxygen conserving device Yes   Oxygen delivery system Gas      04/22/20 1508   04/21/20 1544  For home use only DME Shower stool  Once       Comments: Congestive heart failure with hypoxic respiratory failure   04/21/20 1545          Major procedures and Radiology Reports - PLEASE review detailed and final reports for all details, in brief -    DG Chest 2 View  Result Date: 04/19/2020 CLINICAL DATA:  Chest pain EXAM: CHEST - 2 VIEW COMPARISON:  04/01/2020 chest radiograph. FINDINGS: Partially visualized surgical hardware  from ACDF. Stable cardiomediastinal silhouette with normal heart size. No pneumothorax. Chronic blunting of the costophrenic angles bilaterally. Hyperinflated lungs. No pulmonary edema. No acute consolidative airspace disease. IMPRESSION: 1. Hyperinflated lungs, suggesting COPD. 2. No acute cardiopulmonary disease. Electronically Signed   By: Ilona Sorrel M.D.   On:  04/19/2020 14:11   DG Chest 2 View  Result Date: 04/01/2020 CLINICAL DATA:  Shortness of breath.  History of COPD. EXAM: CHEST - 2 VIEW COMPARISON:  03/30/2020 FINDINGS: The cardiomediastinal silhouette is unchanged with normal heart size. The lungs remain hyperinflated with unchanged peribronchial thickening. No confluent airspace opacity, edema, pleural effusion, pneumothorax is identified. Anterior cervical fusion is noted. IMPRESSION: Stable bronchitic changes. Electronically Signed   By: Pruitt Bores M.D.   On: 04/01/2020 07:52   DG Chest 2 View  Result Date: 03/30/2020 CLINICAL DATA:  Chest pain and shortness of breath for several days EXAM: CHEST - 2 VIEW COMPARISON:  Radiograph 03/27/2020, CT 11/23/2019 FINDINGS: Mild airways thickening. No consolidation, features of edema, pneumothorax, or effusion. The cardiomediastinal contours are stable from priors. No acute osseous or soft tissue abnormality. Degenerative changes are present in the imaged spine and shoulders. Prior cervical fusion. Nasal cannula overlies the chest and base of the neck. IMPRESSION: Mild airways thickening could reflect an acute or chronic bronchitis or reactive airways disease. No acute consolidative opacity. Electronically Signed   By: Lovena Le M.D.   On: 03/30/2020 20:48   DG Chest 2 View  Result Date: 03/27/2020 CLINICAL DATA:  Chest pain. EXAM: CHEST - 2 VIEW COMPARISON:  March 21, 2020. FINDINGS: The heart size and mediastinal contours are within normal limits. Both lungs are clear. No pneumothorax or pleural effusion is noted. The visualized skeletal structures are unremarkable. IMPRESSION: No active cardiopulmonary disease. Electronically Signed   By: Marijo Conception M.D.   On: 03/27/2020 18:55   CT Angio Chest PE W and/or Wo Contrast  Result Date: 04/19/2020 CLINICAL DATA:  Short of breath, RCA or pleural effusion suspected. EXAM: CT ANGIOGRAPHY CHEST WITH CONTRAST TECHNIQUE: Multidetector CT imaging of the  chest was performed using the standard protocol during bolus administration of intravenous contrast. Multiplanar CT image reconstructions and MIPs were obtained to evaluate the vascular anatomy. CONTRAST:  55m OMNIPAQUE IOHEXOL 350 MG/ML SOLN COMPARISON:  None. FINDINGS: Cardiovascular: No filling defects within the pulmonary arteries to suggest acute pulmonary embolism. No acute findings aorta great vessels. No pericardial fluid. Mediastinum/Nodes: No axillary or supraclavicular adenopathy. No mediastinal adenopathy. No pericardial effusion. Esophagus normal. Borderline enlarged RIGHT perihilar lymph nodes measure up to 9 mm (image 57/4). No changed from comparison exam. Lungs/Pleura: No pulmonary infarction. No pneumonia. No pleural fluid. Interlobular septal thickening noted. Small band of atelectasis in the RIGHT lower lobe on image 86/8. Upper Abdomen: Limited view of the liver, kidneys, pancreas are unremarkable. Normal adrenal glands. Musculoskeletal: No aggressive osseous lesion. Review of the MIP images confirms the above findings. IMPRESSION: 1. No evidence acute pulmonary embolism. 2. Interlobular septal thickening suggests interstitial edema. 3. No pulmonary infarction, pneumonia or pleural fluid. 4. Small focus of atelectasis or infection RIGHT lower lobe. Electronically Signed   By: SSuzy BouchardM.D.   On: 04/19/2020 20:27   DG ABD ACUTE 2+V W 1V CHEST  Result Date: 04/08/2020 CLINICAL DATA:  Abdominal pain and distension EXAM: DG ABDOMEN ACUTE W/ 1V CHEST COMPARISON:  Chest radiograph December 02, 2019; CT abdomen and pelvis February 12, 2020 FINDINGS: PA chest: No edema or airspace opacity. Heart  size and pulmonary vascularity are normal. No adenopathy. Supine and upright abdomen: Moderate stool noted in colon. No bowel dilatation or air-fluid level to suggest bowel obstruction. No free air. Postoperative change on the left in the lower lumbar region. Probable small phlebolith inferior right pelvis.  IMPRESSION: No bowel obstruction or free air. Moderate stool in colon. No edema or airspace opacity. Electronically Signed   By: Lowella Grip III M.D.   On: 04/08/2020 11:03    Micro Results    Recent Results (from the past 240 hour(s))  SARS CORONAVIRUS 2 (TAT 6-24 HRS) Nasopharyngeal Nasopharyngeal Swab     Status: None   Collection Time: 04/14/20  7:59 AM   Specimen: Nasopharyngeal Swab  Result Value Ref Range Status   SARS Coronavirus 2 NEGATIVE NEGATIVE Final    Comment: (NOTE) SARS-CoV-2 target nucleic acids are NOT DETECTED.  The SARS-CoV-2 RNA is generally detectable in upper and lower respiratory specimens during the acute phase of infection. Negative results do not preclude SARS-CoV-2 infection, do not rule out co-infections with other pathogens, and should not be used as the sole basis for treatment or other patient management decisions. Negative results must be combined with clinical observations, patient history, and epidemiological information. The expected result is Negative.  Fact Sheet for Patients: SugarRoll.be  Fact Sheet for Healthcare Providers: https://www.woods-mathews.com/  This test is not yet approved or cleared by the [place] FDA and  has been authorized for detection and/or diagnosis of SARS-CoV-2 by FDA under an Emergency Use Authorization (EUA). This EUA will remain  in effect (meaning this test can be used) for the duration of the COVID-19 declaration under Se ction 564(b)(1) of the Act, 21 U.S.C. section 360bbb-3(b)(1), unless the authorization is terminated or revoked sooner.  Performed at [facility], [employer] [address], [place], [place] [address]   SARS Coronavirus 2 by RT PCR (hospital order, performed in [facility] hospital lab) Nasopharyngeal Nasopharyngeal Swab     Status: None   Collection Time: 04/19/20  8:04 PM   Specimen: Nasopharyngeal Swab  Result Value Ref Range Status    SARS Coronavirus 2 NEGATIVE NEGATIVE Final    Comment: (NOTE) SARS-CoV-2 target nucleic acids are NOT DETECTED.  The SARS-CoV-2 RNA is generally detectable in upper and lower respiratory specimens during the acute phase of infection. The lowest concentration of SARS-CoV-2 viral copies this assay can detect is 250 copies / mL. A negative result does not preclude SARS-CoV-2 infection and should not be used as the sole basis for treatment or other patient management decisions.  A negative result may occur with improper specimen collection / handling, submission of specimen other than nasopharyngeal swab, presence of viral mutation(s) within the areas targeted by this assay, and inadequate number of viral copies (<250 copies / mL). A negative result must be combined with clinical observations, patient history, and epidemiological information.  Fact Sheet for Patients:   StrictlyIdeas.no  Fact Sheet for Healthcare Providers: BankingDealers.co.za  This test is not yet approved or  cleared by the [place] FDA and has been authorized for detection and/or diagnosis of SARS-CoV-2 by FDA under an Emergency Use Authorization (EUA).  This EUA will remain in effect (meaning this test can be used) for the duration of the COVID-19 declaration under Section 564(b)(1) of the Act, 21 U.S.C. section 360bbb-3(b)(1), unless the authorization is terminated or revoked sooner.  Performed at [facility], [address] [place], [place] [address]        Today  Subjective    [name] today has no new complaints -No shortness of breath at rest, dyspnea on exertion back at baseline          Patient has been seen and examined prior to discharge   Objective   Blood pressure 111/76, pulse (!) 109, temperature 98.4 F (36.9 C), temperature source Oral, resp. rate 19, height _0  (1.6 m), weight 54.2 kg, SpO2 97 %.   Intake/Output Summary (Last  24 hours) at 04/22/2020 1508 Last data filed at 04/22/2020 1200 Gross per 24 hour  Intake 363 ml  Output 2325 ml  Net -1962 ml    Exam Gen:- Awake Alert, no acute distress  HEENT:- [place].AT, No sclera icterus Nose- [place] 2.5L/min Neck-Supple Neck,No JVD,.  Lungs-improved air movement, no wheezing or rales  CV- S1, S2 normal, regular Abd-  +ve B.Sounds, Abd Soft, No tenderness,    Extremity/Skin:- No  edema,   good pulses Psych-affect is appropriate, oriented x3 Neuro-no new focal deficits, no tremors    Data Review   CBC w Diff:  Lab Results  Component Value Date   WBC 13.2 (H) 04/21/2020   HGB 10.5 (L) 04/21/2020   HCT 35.4 (L) 04/21/2020   PLT 560 (H) 04/21/2020   LYMPHOPCT 11 03/20/2020   MONOPCT 6 03/20/2020   EOSPCT 0 03/20/2020   BASOPCT 1 03/20/2020    CMP:  Lab Results  Component Value Date   NA 136 04/22/2020   K 3.7 04/22/2020   CL 90 (L) 04/22/2020   CO2 33 (H) 04/22/2020   BUN 33 (H) 04/22/2020   CREATININE 1.63 (H) 04/22/2020   CREATININE 1.49 (H) 03/19/2020   PROT 6.5 04/21/2020   ALBUMIN 3.0 (L) 04/21/2020   BILITOT 0.5 04/21/2020   ALKPHOS 75 04/21/2020   AST 15 04/21/2020   ALT 15 04/21/2020  .   Total Discharge time is about 33 minutes  Roxan Hockey M.D on 04/22/2020 at 3:08 PM  Go to [email or web address] -  for contact info  [facility] - Office  [phone]

## 2020-04-22 NOTE — Care Management Important Message (Signed)
Important Message  Patient Details  Name: Bryan [name] MRN: [id number] Date of Birth: 26-Jun-1956   Medicare Important Message Given:  Yes     Salome Arnt, [place] 04/22/2020, 12:37 PM

## 2020-04-22 NOTE — TOC Transition Note (Signed)
Transition of Care [facility]) - CM/SW Discharge Note   Patient Details  Name: Bryan [name] MRN: [id number] Date of Birth: [birth year]  Transition of Care [facility]) CM/SW Contact:  Salome Arnt, LCSW Phone Number: 04/22/2020, 3:17 PM   Clinical Narrative:  Pt d/c today and will have home health PT/RN. Discussed with pt who is agreeable to refer to [facility]. Linda notified and accepts referral. Orders in for 3N1 and was delivered to pt's room. Pt also requesting humidifier for home O2. LCSW called Melissa with [employer] to notify her of order. LCSW scheduled hospital follow up appointment with PCP. Appointment on AVS. No other needs reported.      Final next level of care: Home w Home Health Services Barriers to Discharge: Barriers Resolved   Patient Goals and CMS Choice Patient states their goals for this hospitalization and ongoing recovery are:: return home   Choice offered to / list presented to : Patient  Discharge Placement                  Name of family member notified: Pt will notify family. Patient and family notified of of transfer: 04/22/20  Discharge Plan and Services In-house Referral: Clinical Social Work              DME Arranged: 3-N-1 DME Agency: [facility] Date DME Agency Contacted: 04/22/20 Time DME Agency Contacted: [phone] Representative spoke with at DME Agency: [place]: RN, PT [place] Agency: [place] ([place]) Date [place]: 04/22/20 Time [place]: [place] Representative spoke with at [place]: [place] ([place]) Interventions     Readmission Risk Interventions Readmission Risk Prevention Plan 04/21/2020 11/25/2019  Transportation Screening Complete Complete  PCP or Specialist Appt within 3-5 Days - Complete  HRI or [place] - Complete  Social Work Consult for [place] Planning/Counseling - Complete  Palliative Care Screening - Not Applicable   Medication Review Press photographer) Complete Complete  PCP or Specialist appointment within 3-5 days of discharge Complete -  [place] or Home Care Consult Complete -  SW Recovery Care/Counseling Consult Complete -  Palliative Care Screening Not Applicable -  [place] Not Applicable -  Some recent data might be hidden

## 2020-04-22 NOTE — Discharge Instructions (Signed)
1)Very low-salt diet advised 2)Weigh yourself daily, call if you gain more than 3 pounds in 1 day or more than 5 pounds in 1 week as your diuretic medications may need to be adjusted 3)Limit your Fluid  intake to no more than 60 ounces (1.8 Liters) per day 4)Avoid ibuprofen/Advil/Aleve/Motrin/Goody Powders/Naproxen/BC powders/Meloxicam/Diclofenac/Indomethacin and other Nonsteroidal anti-inflammatory medications as these will make you more likely to bleed and can cause stomach ulcers, can also cause Kidney problems.  --- You are strongly advised again to STOP ibuprofen and similar medications 5)Repeat BMP blood test with the primary care physician within a week advised 6)You need oxygen at home at 2.5  L via nasal cannula continuously while awake and while asleep--- smoking or having open fires around oxygen can cause fire, significant injury and death

## 2020-04-28 ENCOUNTER — Emergency Department (HOSPITAL_COMMUNITY): Payer: Medicare Other

## 2020-04-28 ENCOUNTER — Emergency Department (HOSPITAL_COMMUNITY)
Admission: EM | Admit: 2020-04-28 | Discharge: 2020-04-28 | Disposition: A | Discharge status: Home / Self Care | Payer: Medicare Other | Attending: Emergency Medicine | Admitting: Emergency Medicine

## 2020-04-28 DIAGNOSIS — J449 Chronic obstructive pulmonary disease, unspecified: Secondary | ICD-10-CM | POA: Diagnosis not present

## 2020-04-28 DIAGNOSIS — E1122 Type 2 diabetes mellitus with diabetic chronic kidney disease: Secondary | ICD-10-CM | POA: Insufficient documentation

## 2020-04-28 DIAGNOSIS — R0602 Shortness of breath: Secondary | ICD-10-CM | POA: Insufficient documentation

## 2020-04-28 DIAGNOSIS — N183 Chronic kidney disease, stage 3 unspecified: Secondary | ICD-10-CM | POA: Diagnosis not present

## 2020-04-28 DIAGNOSIS — J45909 Unspecified asthma, uncomplicated: Secondary | ICD-10-CM | POA: Diagnosis not present

## 2020-04-28 DIAGNOSIS — I130 Hypertensive heart and chronic kidney disease with heart failure and stage 1 through stage 4 chronic kidney disease, or unspecified chronic kidney disease: Secondary | ICD-10-CM | POA: Insufficient documentation

## 2020-04-28 DIAGNOSIS — I5032 Chronic diastolic (congestive) heart failure: Secondary | ICD-10-CM | POA: Insufficient documentation

## 2020-04-28 DIAGNOSIS — R0789 Other chest pain: Secondary | ICD-10-CM | POA: Diagnosis not present

## 2020-04-28 DIAGNOSIS — R079 Chest pain, unspecified: Secondary | ICD-10-CM

## 2020-04-28 DIAGNOSIS — Z87891 Personal history of nicotine dependence: Secondary | ICD-10-CM | POA: Insufficient documentation

## 2020-04-28 DIAGNOSIS — Z79899 Other long term (current) drug therapy: Secondary | ICD-10-CM | POA: Insufficient documentation

## 2020-04-28 LAB — BASIC METABOLIC PANEL
Anion gap: 8 (ref 5–15)
BUN: 21 mg/dL (ref 8–23)
CO2: 30 mmol/L (ref 22–32)
Calcium: 8.4 mg/dL — ABNORMAL LOW (ref 8.9–10.3)
Chloride: 105 mmol/L (ref 98–111)
Creatinine, Ser: 1.69 mg/dL — ABNORMAL HIGH (ref 0.61–1.24)
GFR calc Af Amer: 49 mL/min — ABNORMAL LOW (ref >60)
GFR calc non Af Amer: 42 mL/min — ABNORMAL LOW (ref >60)
Glucose, Bld: 109 mg/dL — ABNORMAL HIGH (ref 70–99)
Potassium: 4.7 mmol/L (ref 3.5–5.1)
Sodium: 143 mmol/L (ref 135–145)

## 2020-04-28 LAB — CBC
HCT: 35.9 % — ABNORMAL LOW (ref 39.0–52.0)
Hemoglobin: 10.3 g/dL — ABNORMAL LOW (ref 13.0–17.0)
MCH: 23.7 pg — ABNORMAL LOW (ref 26.0–34.0)
MCHC: 28.7 g/dL — ABNORMAL LOW (ref 30.0–36.0)
MCV: 82.7 fL (ref 80.0–100.0)
Platelets: 303 10*3/uL (ref 150–400)
RBC: 4.34 MIL/uL (ref 4.22–5.81)
RDW: 18.7 % — ABNORMAL HIGH (ref 11.5–15.5)
WBC: 8.4 10*3/uL (ref 4.0–10.5)
nRBC: 0 % (ref 0.0–0.2)

## 2020-04-28 LAB — TROPONIN I (HIGH SENSITIVITY)
Troponin I (High Sensitivity): 8 ng/L (ref <18)
Troponin I (High Sensitivity): 9 ng/L (ref <18)

## 2020-04-28 LAB — BRAIN NATRIURETIC PEPTIDE: B Natriuretic Peptide: 648 pg/mL — ABNORMAL HIGH (ref 0.0–100.0)

## 2020-04-28 MED ORDER — FUROSEMIDE 10 MG/ML IJ SOLN
20.0000 mg | Freq: Once | INTRAMUSCULAR | Status: AC
  Administered 2020-04-28: 20 mg via INTRAVENOUS
  Filled 2020-04-28: qty 2

## 2020-04-28 NOTE — ED Triage Notes (Signed)
Chest pain

## 2020-04-28 NOTE — Discharge Instructions (Signed)
Be sure to pick up and start taking the torsemide, as prescribed. Follow-up with your primary care provider and cardiology.

## 2020-04-28 NOTE — ED Provider Notes (Signed)
[facility] EMERGENCY DEPARTMENT Provider Note   CSN: [id number] Arrival date & time: 04/28/20  1827     History Chief Complaint  Patient presents with  . Chest Pain    Bryan [name] is a 64 y.o. male.  HPI      Bryan [name] is a 64 y.o. male, with a history of COPD, DM, HTN, NSTEMI, presenting to the ED with onset of chest tightness around 4 PM today while at rest.  States it felt similar to when he had "fluid buildup in the lungs." He states he was discharged from the hospital last Wednesday, August 11.  He was prescribed a "fluid pill," but was unable to pick up this medication due to a mistake at the pharmacy. His chest tightness has improved since onset.  He feels more short of breath with taking a deep breath. He is on home supplemental O2 of 2.5 L. Denies fever/chills, cough, abdominal pain, dizziness, diaphoresis, lower extremity pain/edema, N/V/D, orthopnea, or any other complaints  Past Medical History:  Diagnosis Date  . Acid reflux   . Arthritis   . Asthma   . Chronic pain    with leg and back pain (disc problem)  . COPD (chronic obstructive pulmonary disease) ([place])   . Diabetes mellitus without complication ([place])   . Headache    HX OF  . History of upper GI x-ray series    to follow showed large duodenal ulcer H pylori serologies were negative  . Hypertension   . NSTEMI (non-ST elevated myocardial infarction) ([place]) 11/2019  . Pneumonia    07/17/17  . Pre-diabetes   . Stroke [facility])    TIA MINI STROKE  . Tubular adenoma     Patient Active Problem List   Diagnosis Date Noted  . Acute on chronic diastolic congestive heart failure ([place]) 04/19/2020  . Acute on chronic respiratory failure with hypoxia ([place]) 02/14/2020  . Diverticulitis of colon   . Abnormal CT scan, colon   . RUQ pain   . Acute on chronic diastolic CHF (congestive heart failure) ([place]) 02/12/2020  . PUD (peptic ulcer disease) 01/28/2020  . Nonobstructive atherosclerosis of coronary artery     . Pulmonary hypertension due to COPD ([place])   . NSTEMI (non-ST elevated myocardial infarction) ([place]) 11/25/2019  . Non-insulin dependent type 2 diabetes mellitus ([place])   . Mixed hyperlipidemia   . Non-ST elevation (NSTEMI) myocardial infarction ([place]) 11/24/2019  . Oxygen dependent 11/24/2019  . Former smoker 11/24/2019  . Coronary artery calcification seen on CAT scan   . Angina pectoris ([place]) 11/23/2019  . SOB (shortness of breath) 11/23/2019  . Chronic diastolic CHF (congestive heart failure) ([place]) 08/03/2019  . Acute on chronic respiratory failure ([place]) 08/02/2019  . Acute exacerbation of CHF (congestive heart failure) /HFpEF (EF 60%) 06/13/2019  . Normocytic anemia 02/15/2019  . COPD exacerbation ([name]) 09/16/2018  . Hypokalemia 06/26/2018  . Hypomagnesemia 06/26/2018  . Microcytic anemia 06/26/2018  . BPH (benign prostatic hyperplasia) 06/26/2018  . Chronic respiratory failure with hypoxia ([place]) 06/04/2018  . HCAP (healthcare-associated pneumonia) 06/04/2018  . Acute respiratory failure with hypoxia ([place]) 04/14/2018  . Pulmonary nodule 04/14/2018  . CKD (chronic kidney disease) stage 3, GFR 30-59 ml/min 04/14/2018  . Type 2 diabetes mellitus with hyperlipidemia ([place]) 03/12/2018  . BPH with obstruction/lower urinary tract symptoms 08/17/2017  . Left sided numbness 07/22/2017  . TIA (transient ischemic attack) 07/22/2017  . Pneumonia 07/14/2017  . COPD with acute exacerbation ([place]) 07/14/2017  .  Constipation 07/10/2017  . Hx of adenomatous colonic polyps 05/01/2017  . Benign hypertension with CKD (chronic kidney disease) stage III   . Chronic pain   . Heme positive stool 10/20/2014  . Epigastric pain 06/19/2014  . Dysphagia 06/19/2014  . AP (abdominal pain) 11/13/2013  . Early satiety 11/13/2013  . COPD (chronic obstructive pulmonary disease) ([place]) 02/22/2013  . Low back pain 11/19/2012  . COLITIS 05/12/2009  . ABDOMINAL PAIN, LEFT LOWER QUADRANT 05/12/2009  . DUODENAL  ULCER, HX OF 05/12/2009  . H/o Alcohol abuse- Quit in 2010 05/11/2009  . GERD 05/11/2009  . Diarrhea 05/11/2009  . SPONDYLOSIS, CERVICAL 07/03/2007  . NECK PAIN 07/03/2007    Past Surgical History:  Procedure Laterality Date  . BACK SURGERY  7/05; 5/09    Dr.Hirsch,3 lumbar  X3  . BACK SURGERY    . COLONOSCOPY  Feb 2012   Dr. Gala Romney: normal rectum, pedunculate polyp removed but not recovered  . COLONOSCOPY WITH ESOPHAGOGASTRODUODENOSCOPY (EGD) N/A 11/18/2013   Dr.Rourk- tcs= normal rectum, multipe polyps about the ileocecal valve and distal transverse colon o/w the remainder of the colonic mucosa appeared normal bx= tubular adenoma. EGD= normal esophagus, stomach with scattered erosions mottling, friablility, no ulcer or infiltrating process patent pylorus bx= chronic inflammation. next TCS 11/2016  . COLONOSCOPY WITH PROPOFOL N/A 05/25/2017   Dr. Gala Romney: sigmoid diverticulosis, one 4 mm hyerplastic rectal polyp, ascending colonic AVMs surveillance 2023  . COLONOSCOPY WITH PROPOFOL N/A 04/16/2020   Procedure: COLONOSCOPY WITH PROPOFOL;  Surgeon: Daneil Dolin, MD;  [facility];  Service: Endoscopy;  Laterality: N/A;  . ESOPHAGOGASTRODUODENOSCOPY  1/06   Dr. Rourk:Distal esophageal erosions,U-shaped stomach,marked erosions and edema of the bulb without discrete ulcer disease.   . ESOPHAGOGASTRODUODENOSCOPY (EGD) WITH PROPOFOL N/A 05/25/2017   Dr. Gala Romney: reflux esophagitis s/p empiric dilation, normal stomach and duodenum  . ESOPHAGOGASTRODUODENOSCOPY (EGD) WITH PROPOFOL Left 11/30/2019   LA Grade D esophagitis, small hiatal hernia, one non-bleeding cratered gastric ulcer in prepyloric region of stomach, many non-bleeding cratered duodenal ulcers without stigmata of bleeding, mild luminal narrowing at apex of duodenal bulb but traversed easily. H.pylori serology negative.  . ESOPHAGOGASTRODUODENOSCOPY (EGD) WITH PROPOFOL N/A 04/16/2020   Procedure: ESOPHAGOGASTRODUODENOSCOPY (EGD) WITH  PROPOFOL;  Surgeon: Daneil Dolin, MD;  [facility];  Service: Endoscopy;  Laterality: N/A;  . Venia Minks DILATION N/A 05/25/2017   Procedure: Venia Minks DILATION;  Surgeon: Daneil Dolin, MD;  [facility];  Service: Endoscopy;  Laterality: N/A;  . NECK SURGERY  10/2007   PLATE IN NECK  . POLYPECTOMY  05/25/2017   Procedure: POLYPECTOMY;  Surgeon: Daneil Dolin, MD;  [facility];  Service: Endoscopy;;  rectal  . RIGHT/LEFT HEART CATH AND CORONARY ANGIOGRAPHY N/A 12/03/2019   Procedure: RIGHT/LEFT HEART CATH AND CORONARY ANGIOGRAPHY;  Surgeon: Nigel Mormon, MD;  Location: [place] [facility];  Service: Cardiovascular;  Laterality: N/A;  . TRANSURETHRAL RESECTION OF PROSTATE N/A 08/17/2017   Procedure: TRANSURETHRAL RESECTION OF THE PROSTATE (TURP);  Surgeon: Irine Seal, MD;  Location: [facility];  Service: Urology;  Laterality: N/A;       Family History  Problem Relation Age of Onset  . Cancer Father   . Asthma Mother   . Colon cancer Neg Hx     Social History   Tobacco Use  . Smoking status: Former Smoker    Packs/day: 0.50    Years: 40.00    Pack years: 20.00  Types: Cigarettes    Quit date: 11/11/2014    Years since quitting: 5.4  . Smokeless tobacco: Never Used  Vaping Use  . Vaping Use: Never used  Substance Use Topics  . Alcohol use: No    Alcohol/week: 0.0 standard drinks  . Drug use: Not Currently    Types: Marijuana    Comment: occas; denied 01/28/20    Home Medications Prior to Admission medications   Medication Sig Start Date End Date Taking? Authorizing Provider  acetaminophen (TYLENOL) 325 MG tablet Take 2 tablets (650 mg total) by mouth every 6 (six) hours as needed for mild pain, fever or headache (or Fever >/= 101). 04/22/20   Roxan Hockey, MD  albuterol (PROVENTIL) (2.5 MG/3ML) 0.083% nebulizer solution Take 3 mLs (2.5 mg total) by nebulization every 4 (four) hours as needed for wheezing or shortness of breath.  04/22/20   Roxan Hockey, MD  albuterol (VENTOLIN HFA) 108 (90 Base) MCG/ACT inhaler Inhale 2 puffs into the lungs every 4 (four) hours as needed for wheezing or shortness of breath. Do not use with nebulizer 04/22/20   Roxan Hockey, MD  calcium carbonate (TUMS - DOSED IN MG ELEMENTAL CALCIUM) 500 MG chewable tablet Chew 2 tablets (400 mg of elemental calcium total) by mouth 3 (three) times daily as needed for indigestion or heartburn. 12/04/19   Amin, Jeanella Flattery, MD  Fluticasone-Umeclidin-Vilant (TRELEGY ELLIPTA) 100-62.5-25 MCG/INH AEPB Inhale 1 puff into the lungs daily. 06/14/19   Roxan Hockey, MD  gabapentin (NEURONTIN) 300 MG capsule Take 1 capsule (300 mg total) by mouth 2 (two) times daily. Pt. Says he is taking twice daily. Patient taking differently: Take 600 mg by mouth in the morning, at noon, and at bedtime.  06/14/19   Roxan Hockey, MD  guaiFENesin (MUCINEX) 600 MG 12 hr tablet Take 1 tablet (600 mg total) by mouth 2 (two) times daily. 04/22/20 04/22/21  Roxan Hockey, MD  lidocaine (LIDODERM) 5 % Place 1 patch onto the skin daily. Remove & Discard patch within 12 hours or as directed by MD 04/01/20   Maudie Flakes, MD  linaclotide [facility]) 72 MCG capsule Take 1 capsule (72 mcg total) by mouth daily before breakfast. Patient taking differently: Take 72 mcg by mouth daily as needed (constipation.).  06/14/19   Roxan Hockey, MD  magnesium oxide (MAG-OX) 400 MG tablet Take 400 mg by mouth daily.    [provider]  methocarbamol (ROBAXIN) 500 MG tablet Take 1 tablet (500 mg total) by mouth every 8 (eight) hours as needed for muscle spasms. 04/01/20   Maudie Flakes, MD  metoprolol tartrate (LOPRESSOR) 25 MG tablet Take 0.5 tablets (12.5 mg total) by mouth 2 (two) times daily. 12/04/19 03/20/21  Amin, Jeanella Flattery, MD  OXYGEN Inhale 2.5 L into the lungs daily.    [provider]  pantoprazole (PROTONIX) 40 MG tablet Take 1 tablet (40 mg total) by mouth 2 (two)  times daily before a meal. Patient taking differently: Take 40 mg by mouth in the morning and at bedtime.  12/04/19 03/20/21  Amin, Jeanella Flattery, MD  sucralfate (CARAFATE) 1 GM/10ML suspension Take 10 mLs (1 g total) by mouth 4 (four) times daily -  with meals and at bedtime. 04/22/20   Roxan Hockey, MD  torsemide (DEMADEX) 20 MG tablet Take 1 tablet (20 mg total) by mouth 2 (two) times daily. For Fluid/Heart 04/22/20   Roxan Hockey, MD  traMADol (ULTRAM) 50 MG tablet Take 1 tablet (50 mg total) by  mouth every 6 (six) hours as needed. 03/30/20   Virgel Manifold, MD    Allergies    Patient has no known allergies.  Review of Systems   Review of Systems  Constitutional: Negative for chills, diaphoresis and fever.  Respiratory: Positive for shortness of breath. Negative for cough.   Cardiovascular: Positive for chest pain. Negative for leg swelling.  Gastrointestinal: Negative for abdominal pain, diarrhea, nausea and vomiting.  Neurological: Negative for dizziness, syncope and weakness.  All other systems reviewed and are negative.   Physical Exam Updated Vital Signs BP (!) 150/85 (BP Location: Right Arm)   Pulse 78   Temp 98.3 F (36.8 C) (Oral)   Resp 18   Ht _0  (1.6 m)   Wt 56.7 kg   SpO2 99%   BMI 22.14 kg/m   Physical Exam Vitals and nursing note reviewed.  Constitutional:      General: He is not in acute distress.    Appearance: He is well-developed. He is not diaphoretic.  HENT:     Head: Normocephalic and atraumatic.     Mouth/Throat:     Mouth: Mucous membranes are moist.     Pharynx: Oropharynx is clear.  Eyes:     Conjunctiva/sclera: Conjunctivae normal.  Cardiovascular:     Rate and Rhythm: Normal rate and regular rhythm.     Pulses: Normal pulses.          Radial pulses are 2+ on the right side and 2+ on the left side.       Posterior tibial pulses are 2+ on the right side and 2+ on the left side.     Heart sounds: Normal heart sounds.     Comments:  Tactile temperature in the extremities appropriate and equal bilaterally. Pulmonary:     Effort: Pulmonary effort is normal. No respiratory distress.     Breath sounds: Normal breath sounds.     Comments: No increased work of breathing.  Speaks in full sentences without noted difficulty. Patient is able to lie semireclined without evidence of orthopnea. Abdominal:     Palpations: Abdomen is soft.     Tenderness: There is no abdominal tenderness. There is no guarding.  Musculoskeletal:     Cervical back: Neck supple.     Right lower leg: No edema.     Left lower leg: No edema.  Feet:     Comments: No lower extremity swelling, pain, tenderness, erythema, increased warmth. Lymphadenopathy:     Cervical: No cervical adenopathy.  Skin:    General: Skin is warm and dry.  Neurological:     Mental Status: He is alert.  Psychiatric:        Mood and Affect: Mood and affect normal.        Speech: Speech normal.        Behavior: Behavior normal.     ED Results / Procedures / Treatments   Labs (all labs ordered are listed, but only abnormal results are displayed) Labs Reviewed  BASIC METABOLIC PANEL - Abnormal; Notable for the following components:      Result Value   Glucose, Bld 109 (*)    Creatinine, Ser 1.69 (*)    Calcium 8.4 (*)    GFR calc non Af Amer 42 (*)    GFR calc Af Amer 49 (*)    All other components within normal limits  CBC - Abnormal; Notable for the following components:   Hemoglobin 10.3 (*)    HCT 35.9 (*)  MCH 23.7 (*)    MCHC 28.7 (*)    RDW 18.7 (*)    All other components within normal limits  BRAIN NATRIURETIC PEPTIDE - Abnormal; Notable for the following components:   B Natriuretic Peptide 648.0 (*)    All other components within normal limits  TROPONIN I (HIGH SENSITIVITY)  TROPONIN I (HIGH SENSITIVITY)    Hemoglobin  Date Value Ref Range Status  04/28/2020 10.3 (L) 13.0 - 17.0 g/dL Final  04/21/2020 10.5 (L) 13.0 - 17.0 g/dL Final    04/20/2020 10.9 (L) 13.0 - 17.0 g/dL Final  04/19/2020 10.9 (L) 13.0 - 17.0 g/dL Final    EKG EKG Interpretation  Date/Time:  Tuesday April 28 2020 18:51:06 EDT Ventricular Rate:  85 PR Interval:    QRS Duration: 86 QT Interval:  356 QTC Calculation: 423 R Axis:   92 Text Interpretation: Sinus rhythm Rightward axis Abnormal ECG No STEMI Confirmed by Nanda Quinton [phone]) on 04/28/2020 8:44:58 PM   Radiology DG Chest 2 View  Result Date: 04/28/2020 CLINICAL DATA:  Chest pain. Additional history provided: Shortness of breath with chest pain, weight gain over the past 2 days, patient feels he is retaining fluid in his lungs. EXAM: CHEST - 2 VIEW COMPARISON:  CT angiogram chest 05/01/2020. chest radiographs 04/19/2020 and earlier. FINDINGS: Heart size within normal limits. Central pulmonary vascular congestion without overt pulmonary edema. No evidence of airspace consolidation. No evidence of pleural effusion or pneumothorax. No acute bony abnormality identified. Partially visualized ACDF hardware within the lower cervical spine. Partially imaged lumbar spinal fusion hardware. IMPRESSION: Central pulmonary vascular congestion without overt pulmonary edema. No appreciable airspace consolidation. Electronically Signed   By: Kellie Simmering DO   On: 04/28/2020 19:48    Procedures Procedures (including critical care time)  Medications Ordered in ED Medications  furosemide (LASIX) injection 20 mg (20 mg Intravenous Given 04/28/20 2111)    ED Course  I have reviewed the triage vital signs and the nursing notes.  Pertinent labs & imaging results that were available during my care of the patient were reviewed by me and considered in my medical decision making (see chart for details).  Clinical Course as of Apr 29 2323  Tue Apr 28, 2020  2303 Patient was not actually tachypneic prior to discharge.  Resp(!): 24 [SJ]    Clinical Course User Index [SJ] Osric Klopf, Helane Gunther, PA-C   MDM  Rules/Calculators/A&P                          Patient presents complaining of some chest tightness. Patient is nontoxic appearing, afebrile, not tachycardic, not tachypneic, not hypotensive, maintains excellent SPO2 on his home supplemental O2, and is in no apparent distress.   I have reviewed the patient's chart to obtain more information.   I reviewed and interpreted the patient's labs and radiological studies. Delta troponins negative.  EKG without acute abnormalities. BNP improved from previous. Patient's symptoms resolved early in the ED course without intervention and did not recur. He states he has plans to see his PCP tomorrow. The patient was given instructions for home care as well as return precautions. Patient voices understanding of these instructions, accepts the plan, and is comfortable with discharge.   Findings and plan of care discussed with Gara Kroner, MD. Dr. Laverta Baltimore personally evaluated and examined this patient.  Vitals:   04/28/20 1847 04/28/20 1848 04/28/20 2100 04/28/20 2300  BP: (!) 150/85  (!) 147/87 126/69  Pulse: 78  72 74  Resp: 18  (!) 31 (!) 24  Temp: 98.3 F (36.8 C)   98 F (36.7 C)  TempSrc: Oral     SpO2: 99%  99% 99%  Weight:  56.7 kg    Height:  _0  (1.6 m)       Final Clinical Impression(s) / ED Diagnoses Final diagnoses:  None    Rx / DC Orders ED Discharge Orders    None       Layla Maw 04/28/20 2333    Margette Fast, MD 05/04/20 1130

## 2020-04-30 ENCOUNTER — Other Ambulatory Visit: Payer: Self-pay

## 2020-04-30 ENCOUNTER — Other Ambulatory Visit: Payer: Self-pay | Admitting: *Deleted

## 2020-05-06 ENCOUNTER — Encounter (HOSPITAL_COMMUNITY): Payer: Medicare Other

## 2020-05-18 ENCOUNTER — Encounter (HOSPITAL_COMMUNITY): Payer: Self-pay | Admitting: Emergency Medicine

## 2020-05-18 ENCOUNTER — Other Ambulatory Visit: Payer: Self-pay

## 2020-05-18 ENCOUNTER — Emergency Department (HOSPITAL_COMMUNITY): Payer: Medicare Other

## 2020-05-18 DIAGNOSIS — R0789 Other chest pain: Secondary | ICD-10-CM | POA: Diagnosis not present

## 2020-05-18 DIAGNOSIS — I5033 Acute on chronic diastolic (congestive) heart failure: Secondary | ICD-10-CM | POA: Diagnosis not present

## 2020-05-18 DIAGNOSIS — Z20822 Contact with and (suspected) exposure to covid-19: Secondary | ICD-10-CM | POA: Diagnosis not present

## 2020-05-18 DIAGNOSIS — R42 Dizziness and giddiness: Secondary | ICD-10-CM | POA: Insufficient documentation

## 2020-05-18 DIAGNOSIS — Z7951 Long term (current) use of inhaled steroids: Secondary | ICD-10-CM | POA: Insufficient documentation

## 2020-05-18 DIAGNOSIS — Z79899 Other long term (current) drug therapy: Secondary | ICD-10-CM | POA: Diagnosis not present

## 2020-05-18 DIAGNOSIS — J45909 Unspecified asthma, uncomplicated: Secondary | ICD-10-CM | POA: Diagnosis not present

## 2020-05-18 DIAGNOSIS — R079 Chest pain, unspecified: Secondary | ICD-10-CM | POA: Diagnosis present

## 2020-05-18 DIAGNOSIS — Z87891 Personal history of nicotine dependence: Secondary | ICD-10-CM | POA: Insufficient documentation

## 2020-05-18 DIAGNOSIS — R0602 Shortness of breath: Secondary | ICD-10-CM | POA: Diagnosis not present

## 2020-05-18 DIAGNOSIS — I130 Hypertensive heart and chronic kidney disease with heart failure and stage 1 through stage 4 chronic kidney disease, or unspecified chronic kidney disease: Secondary | ICD-10-CM | POA: Diagnosis not present

## 2020-05-18 DIAGNOSIS — N183 Chronic kidney disease, stage 3 unspecified: Secondary | ICD-10-CM | POA: Diagnosis not present

## 2020-05-18 NOTE — ED Triage Notes (Signed)
Pt states when he walks he starts having chest pain and increased sob.

## 2020-05-19 ENCOUNTER — Emergency Department (HOSPITAL_COMMUNITY)
Admission: EM | Admit: 2020-05-19 | Discharge: 2020-05-19 | Disposition: A | Discharge status: Home / Self Care | Payer: Medicare Other | Attending: Emergency Medicine | Admitting: Emergency Medicine

## 2020-05-19 DIAGNOSIS — R0602 Shortness of breath: Secondary | ICD-10-CM

## 2020-05-19 DIAGNOSIS — R0789 Other chest pain: Secondary | ICD-10-CM

## 2020-05-19 LAB — BASIC METABOLIC PANEL
Anion gap: 12 (ref 5–15)
BUN: 46 mg/dL — ABNORMAL HIGH (ref 8–23)
CO2: 30 mmol/L (ref 22–32)
Calcium: 8.9 mg/dL (ref 8.9–10.3)
Chloride: 96 mmol/L — ABNORMAL LOW (ref 98–111)
Creatinine, Ser: 2.46 mg/dL — ABNORMAL HIGH (ref 0.61–1.24)
GFR calc Af Amer: 31 mL/min — ABNORMAL LOW (ref >60)
GFR calc non Af Amer: 27 mL/min — ABNORMAL LOW (ref >60)
Glucose, Bld: 107 mg/dL — ABNORMAL HIGH (ref 70–99)
Potassium: 3.9 mmol/L (ref 3.5–5.1)
Sodium: 138 mmol/L (ref 135–145)

## 2020-05-19 LAB — CBC
HCT: 38.6 % — ABNORMAL LOW (ref 39.0–52.0)
Hemoglobin: 11.3 g/dL — ABNORMAL LOW (ref 13.0–17.0)
MCH: 23.5 pg — ABNORMAL LOW (ref 26.0–34.0)
MCHC: 29.3 g/dL — ABNORMAL LOW (ref 30.0–36.0)
MCV: 80.2 fL (ref 80.0–100.0)
Platelets: 400 10*3/uL (ref 150–400)
RBC: 4.81 MIL/uL (ref 4.22–5.81)
RDW: 19.1 % — ABNORMAL HIGH (ref 11.5–15.5)
WBC: 8.3 10*3/uL (ref 4.0–10.5)
nRBC: 0 % (ref 0.0–0.2)

## 2020-05-19 LAB — TROPONIN I (HIGH SENSITIVITY)
Troponin I (High Sensitivity): 9 ng/L (ref <18)
Troponin I (High Sensitivity): 9 ng/L (ref <18)

## 2020-05-19 LAB — BRAIN NATRIURETIC PEPTIDE: B Natriuretic Peptide: 344 pg/mL — ABNORMAL HIGH (ref 0.0–100.0)

## 2020-05-19 LAB — SARS CORONAVIRUS 2 BY RT PCR (HOSPITAL ORDER, PERFORMED IN ~~LOC~~ HOSPITAL LAB): SARS Coronavirus 2: NEGATIVE

## 2020-05-19 LAB — D-DIMER, QUANTITATIVE: D-Dimer, Quant: 0.81 ug/mL-FEU — ABNORMAL HIGH (ref 0.00–0.50)

## 2020-05-19 MED ORDER — NITROGLYCERIN 0.4 MG SL SUBL: 0.4000 mg | SUBLINGUAL_TABLET | SUBLINGUAL | 0 refills | Status: AC | PRN

## 2020-05-19 NOTE — ED Provider Notes (Signed)
[facility] EMERGENCY DEPARTMENT Provider Note   CSN: [id number] Arrival date & time: 05/18/20  1952   Time seen 4:03 AM  History Chief Complaint  Patient presents with  . Chest Pain    Bryan [name] is a 64 y.o. male.  HPI   Patient states he has been having chest pain off and on for the past 7 months and he has been hospitalized 4-5 times.  He states 2 to 3 days ago he started getting central chest pain he described as burning especially when he exerts himself or walks.  He states that last 20 to 30 minutes and makes him feel short of breath and dizzy.  He states he has been told he has fluid in his lungs and congestive heart failure.  He has been having swelling of his legs and notices indentions from his socks for over 2 to 3 months.  He denies cough or fever.  He states sometimes he has wheezing but his inhaler helps.  He states he feels like he cannot take a big deep breath.  Patient states he used to smoke 2 packs a day but he quit for 5 years ago.  He is on home oxygen at 2.5 L/min nasal cannula.  He denies any gastric reflux.  He states when he used the bathroom out in the waiting room he had a sharp pain in the central part of his chest that only lasted about 10 minutes.  PCP Rosita Fire, MD   Past Medical History:  Diagnosis Date  . Acid reflux   . Arthritis   . Asthma   . Chronic pain    with leg and back pain (disc problem)  . COPD (chronic obstructive pulmonary disease) ([place])   . Diabetes mellitus without complication ([place])   . Headache    HX OF  . History of upper GI x-ray series    to follow showed large duodenal ulcer H pylori serologies were negative  . Hypertension   . NSTEMI (non-ST elevated myocardial infarction) ([place]) 11/2019  . Pneumonia    07/17/17  . Pre-diabetes   . Stroke [facility])    TIA MINI STROKE  . Tubular adenoma     Patient Active Problem List   Diagnosis Date Noted  . Acute on chronic diastolic congestive heart failure ([place]) 04/19/2020  .  Acute on chronic respiratory failure with hypoxia ([place]) 02/14/2020  . Diverticulitis of colon   . Abnormal CT scan, colon   . RUQ pain   . Acute on chronic diastolic CHF (congestive heart failure) ([place]) 02/12/2020  . PUD (peptic ulcer disease) 01/28/2020  . Nonobstructive atherosclerosis of coronary artery   . Pulmonary hypertension due to COPD ([place])   . NSTEMI (non-ST elevated myocardial infarction) ([place]) 11/25/2019  . Non-insulin dependent type 2 diabetes mellitus ([place])   . Mixed hyperlipidemia   . Non-ST elevation (NSTEMI) myocardial infarction ([place]) 11/24/2019  . Oxygen dependent 11/24/2019  . Former smoker 11/24/2019  . Coronary artery calcification seen on CAT scan   . Angina pectoris ([place]) 11/23/2019  . SOB (shortness of breath) 11/23/2019  . Chronic diastolic CHF (congestive heart failure) ([place]) 08/03/2019  . Acute on chronic respiratory failure ([place]) 08/02/2019  . Acute exacerbation of CHF (congestive heart failure) /HFpEF (EF 60%) 06/13/2019  . Normocytic anemia 02/15/2019  . COPD exacerbation ([place]) 09/16/2018  . Hypokalemia 06/26/2018  . Hypomagnesemia 06/26/2018  . Microcytic anemia 06/26/2018  . BPH (benign prostatic hyperplasia) 06/26/2018  . Chronic respiratory failure with  hypoxia ([place]) 06/04/2018  . HCAP (healthcare-associated pneumonia) 06/04/2018  . Acute respiratory failure with hypoxia ([place]) 04/14/2018  . Pulmonary nodule 04/14/2018  . CKD (chronic kidney disease) stage 3, GFR 30-59 ml/min 04/14/2018  . Type 2 diabetes mellitus with hyperlipidemia ([place]) 03/12/2018  . BPH with obstruction/lower urinary tract symptoms 08/17/2017  . Left sided numbness 07/22/2017  . TIA (transient ischemic attack) 07/22/2017  . Pneumonia 07/14/2017  . COPD with acute exacerbation ([place]) 07/14/2017  . Constipation 07/10/2017  . Hx of adenomatous colonic polyps 05/01/2017  . Benign hypertension with CKD (chronic kidney disease) stage III   . Chronic pain   . Heme positive stool  10/20/2014  . Epigastric pain 06/19/2014  . Dysphagia 06/19/2014  . AP (abdominal pain) 11/13/2013  . Early satiety 11/13/2013  . COPD (chronic obstructive pulmonary disease) ([place]) 02/22/2013  . Low back pain 11/19/2012  . COLITIS 05/12/2009  . ABDOMINAL PAIN, LEFT LOWER QUADRANT 05/12/2009  . DUODENAL ULCER, HX OF 05/12/2009  . H/o Alcohol abuse- Quit in 2010 05/11/2009  . GERD 05/11/2009  . Diarrhea 05/11/2009  . SPONDYLOSIS, CERVICAL 07/03/2007  . NECK PAIN 07/03/2007    Past Surgical History:  Procedure Laterality Date  . BACK SURGERY  7/05; 5/09    Dr.Hirsch,3 lumbar  X3  . BACK SURGERY    . COLONOSCOPY  Feb 2012   Dr. Gala Romney: normal rectum, pedunculate polyp removed but not recovered  . COLONOSCOPY WITH ESOPHAGOGASTRODUODENOSCOPY (EGD) N/A 11/18/2013   Dr.Rourk- tcs= normal rectum, multipe polyps about the ileocecal valve and distal transverse colon o/w the remainder of the colonic mucosa appeared normal bx= tubular adenoma. EGD= normal esophagus, stomach with scattered erosions mottling, friablility, no ulcer or infiltrating process patent pylorus bx= chronic inflammation. next TCS 11/2016  . COLONOSCOPY WITH PROPOFOL N/A 05/25/2017   Dr. Gala Romney: sigmoid diverticulosis, one 4 mm hyerplastic rectal polyp, ascending colonic AVMs surveillance 2023  . COLONOSCOPY WITH PROPOFOL N/A 04/16/2020   Procedure: COLONOSCOPY WITH PROPOFOL;  Surgeon: Daneil Dolin, MD;  [facility];  Service: Endoscopy;  Laterality: N/A;  . ESOPHAGOGASTRODUODENOSCOPY  1/06   Dr. Rourk:Distal esophageal erosions,U-shaped stomach,marked erosions and edema of the bulb without discrete ulcer disease.   . ESOPHAGOGASTRODUODENOSCOPY (EGD) WITH PROPOFOL N/A 05/25/2017   Dr. Gala Romney: reflux esophagitis s/p empiric dilation, normal stomach and duodenum  . ESOPHAGOGASTRODUODENOSCOPY (EGD) WITH PROPOFOL Left 11/30/2019   LA Grade D esophagitis, small hiatal hernia, one non-bleeding cratered gastric ulcer in  prepyloric region of stomach, many non-bleeding cratered duodenal ulcers without stigmata of bleeding, mild luminal narrowing at apex of duodenal bulb but traversed easily. H.pylori serology negative.  . ESOPHAGOGASTRODUODENOSCOPY (EGD) WITH PROPOFOL N/A 04/16/2020   Procedure: ESOPHAGOGASTRODUODENOSCOPY (EGD) WITH PROPOFOL;  Surgeon: Daneil Dolin, MD;  [facility];  Service: Endoscopy;  Laterality: N/A;  . Venia Minks DILATION N/A 05/25/2017   Procedure: Venia Minks DILATION;  Surgeon: Daneil Dolin, MD;  Location[facility];  Service: Endoscopy;  Laterality: N/A;  . NECK SURGERY  10/2007   PLATE IN NECK  . POLYPECTOMY  05/25/2017   Procedure: POLYPECTOMY;  Surgeon: Daneil Dolin, MD;  [facility];  Service: Endoscopy;;  rectal  . RIGHT/LEFT HEART CATH AND CORONARY ANGIOGRAPHY N/A 12/03/2019   Procedure: RIGHT/LEFT HEART CATH AND CORONARY ANGIOGRAPHY;  Surgeon: Nigel Mormon, MD;  Location: [place] CV LAB;  Service: Cardiovascular;  Laterality: N/A;  . TRANSURETHRAL RESECTION OF PROSTATE N/A 08/17/2017   Procedure: TRANSURETHRAL RESECTION OF THE PROSTATE (  TURP);  Surgeon: Irine Seal, MD;  Location: [facility] OR[facility];  Service: Urology;  Laterality: N/A;       Family History  Problem Relation Age of Onset  . Cancer Father   . Asthma Mother   . Colon cancer Neg Hx     Social History   Tobacco Use  . Smoking status: Former Smoker    Packs/day: 0.50    Years: 40.00    Pack years: 20.00    Types: Cigarettes    Quit date: 11/11/2014    Years since quitting: 5.5  . Smokeless tobacco: Never Used  Vaping Use  . Vaping Use: Never used  Substance Use Topics  . Alcohol use: No    Alcohol/week: 0.0 standard drinks  . Drug use: Not Currently    Types: Marijuana    Comment: occas; denied 01/28/20    Home Medications Prior to Admission medications   Medication Sig Start Date End Date Taking? Authorizing Provider  acetaminophen (TYLENOL) 325 MG tablet Take 2 tablets  (650 mg total) by mouth every 6 (six) hours as needed for mild pain, fever or headache (or Fever >/= 101). 04/22/20   Roxan Hockey, MD  albuterol (PROVENTIL) (2.5 MG/3ML) 0.083% nebulizer solution Take 3 mLs (2.5 mg total) by nebulization every 4 (four) hours as needed for wheezing or shortness of breath. 04/22/20   Roxan Hockey, MD  albuterol (VENTOLIN HFA) 108 (90 Base) MCG/ACT inhaler Inhale 2 puffs into the lungs every 4 (four) hours as needed for wheezing or shortness of breath. Do not use with nebulizer 04/22/20   Roxan Hockey, MD  calcium carbonate (TUMS - DOSED IN MG ELEMENTAL CALCIUM) 500 MG chewable tablet Chew 2 tablets (400 mg of elemental calcium total) by mouth 3 (three) times daily as needed for indigestion or heartburn. 12/04/19   Amin, Jeanella Flattery, MD  Fluticasone-Umeclidin-Vilant (TRELEGY ELLIPTA) 100-62.5-25 MCG/INH AEPB Inhale 1 puff into the lungs daily. 06/14/19   Roxan Hockey, MD  gabapentin (NEURONTIN) 300 MG capsule Take 1 capsule (300 mg total) by mouth 2 (two) times daily. Pt. Says he is taking twice daily. Patient taking differently: Take 600 mg by mouth in the morning, at noon, and at bedtime.  06/14/19   Roxan Hockey, MD  guaiFENesin (MUCINEX) 600 MG 12 hr tablet Take 1 tablet (600 mg total) by mouth 2 (two) times daily. 04/22/20 04/22/21  Roxan Hockey, MD  lidocaine (LIDODERM) 5 % Place 1 patch onto the skin daily. Remove & Discard patch within 12 hours or as directed by MD 04/01/20   Maudie Flakes, MD  linaclotide [facility]) 72 MCG capsule Take 1 capsule (72 mcg total) by mouth daily before breakfast. Patient taking differently: Take 72 mcg by mouth daily as needed (constipation.).  06/14/19   Roxan Hockey, MD  magnesium oxide (MAG-OX) 400 MG tablet Take 400 mg by mouth daily.    [provider]  methocarbamol (ROBAXIN) 500 MG tablet Take 1 tablet (500 mg total) by mouth every 8 (eight) hours as needed for muscle spasms. 04/01/20   Maudie Flakes, MD  metoprolol tartrate (LOPRESSOR) 25 MG tablet Take 0.5 tablets (12.5 mg total) by mouth 2 (two) times daily. 12/04/19 03/20/21  Amin, Jeanella Flattery, MD  nitroGLYCERIN (NITROSTAT) 0.4 MG SL tablet Place 1 tablet (0.4 mg total) under the tongue every 5 (five) minutes as needed for chest pain. 05/19/20   Rolland Porter, MD  OXYGEN Inhale 2.5 L into the lungs daily.    [provider]  pantoprazole (PROTONIX) 40 MG tablet Take 1 tablet (40 mg total) by mouth 2 (two) times daily before a meal. Patient taking differently: Take 40 mg by mouth in the morning and at bedtime.  12/04/19 03/20/21  Amin, Jeanella Flattery, MD  sucralfate (CARAFATE) 1 GM/10ML suspension Take 10 mLs (1 g total) by mouth 4 (four) times daily -  with meals and at bedtime. 04/22/20   Roxan Hockey, MD  torsemide (DEMADEX) 20 MG tablet Take 1 tablet (20 mg total) by mouth 2 (two) times daily. For Fluid/Heart 04/22/20   Roxan Hockey, MD  traMADol (ULTRAM) 50 MG tablet Take 1 tablet (50 mg total) by mouth every 6 (six) hours as needed. 03/30/20   Virgel Manifold, MD    Allergies    Patient has no known allergies.  Review of Systems   Review of Systems  All other systems reviewed and are negative.   Physical Exam Updated Vital Signs BP 124/74   Pulse (!) 56   Temp 98.9 F (37.2 C) (Oral)   Resp (!) 25   Ht _0  (1.6 m)   Wt 53.5 kg   SpO2 98%   BMI 20.90 kg/m   Physical Exam Vitals and nursing note reviewed.  Constitutional:      Appearance: Normal appearance.     Comments: Underweight  HENT:     Head: Normocephalic and atraumatic.     Right Ear: External ear normal.     Left Ear: External ear normal.     Nose: Nose normal.  Eyes:     Extraocular Movements: Extraocular movements intact.     Conjunctiva/sclera: Conjunctivae normal.  Cardiovascular:     Rate and Rhythm: Normal rate and regular rhythm.     Pulses: Normal pulses.     Heart sounds: Normal heart sounds. No murmur heard.   Pulmonary:      Effort: No tachypnea, accessory muscle usage, prolonged expiration or respiratory distress.     Breath sounds: Decreased air movement present.  Chest:    Musculoskeletal:     Cervical back: Normal range of motion.     Right lower leg: No edema.     Left lower leg: No edema.  Skin:    General: Skin is warm and dry.  Neurological:     General: No focal deficit present.     Mental Status: He is alert and oriented to person, place, and time.     Cranial Nerves: No cranial nerve deficit.  Psychiatric:        Mood and Affect: Mood normal.        Behavior: Behavior normal.        Thought Content: Thought content normal.     ED Results / Procedures / Treatments   Labs (all labs ordered are listed, but only abnormal results are displayed) Results for orders placed or performed during the hospital encounter of 05/19/20  SARS Coronavirus 2 by RT PCR (hospital order, performed in [place] hospital lab) Nasopharyngeal Nasopharyngeal Swab   Specimen: Nasopharyngeal Swab  Result Value Ref Range   SARS Coronavirus 2 NEGATIVE NEGATIVE  Basic metabolic panel  Result Value Ref Range   Sodium 138 135 - 145 mmol/L   Potassium 3.9 3.5 - 5.1 mmol/L   Chloride 96 (L) 98 - 111 mmol/L   CO2 30 22 - 32 mmol/L   Glucose, Bld 107 (H) 70 - 99 mg/dL   BUN 46 (H) 8 - 23 mg/dL   Creatinine, Ser 2.46 (H) 0.61 -  1.24 mg/dL   Calcium 8.9 8.9 - 10.3 mg/dL   GFR calc non Af Amer 27 (L) >60 mL/min   GFR calc Af Amer 31 (L) >60 mL/min   Anion gap 12 5 - 15  CBC  Result Value Ref Range   WBC 8.3 4.0 - 10.5 K/uL   RBC 4.81 4.22 - 5.81 MIL/uL   Hemoglobin 11.3 (L) 13.0 - 17.0 g/dL   HCT 38.6 (L) 39 - 52 %   MCV 80.2 80.0 - 100.0 fL   MCH 23.5 (L) 26.0 - 34.0 pg   MCHC 29.3 (L) 30.0 - 36.0 g/dL   RDW 19.1 (H) 11.5 - 15.5 %   Platelets 400 150 - 400 K/uL   nRBC 0.0 0.0 - 0.2 %  Brain natriuretic peptide  Result Value Ref Range   B Natriuretic Peptide 344.0 (H) 0.0 - 100.0 pg/mL  D-dimer, quantitative   Result Value Ref Range   D-Dimer, Quant 0.81 (H) 0.00 - 0.50 ug/mL-FEU  Troponin I (High Sensitivity)  Result Value Ref Range   Troponin I (High Sensitivity) 9 <18 ng/L  Troponin I (High Sensitivity)  Result Value Ref Range   Troponin I (High Sensitivity) 9 <18 ng/L    Laboratory interpretation all normal except chronic renal insufficiency, minimally elevated D-dimer, minimally elevated but lower than prior BMPs   EKG EKG Interpretation  Date/Time:  Monday May 18 2020 20:27:17 EDT Ventricular Rate:  71 PR Interval:  124 QRS Duration: 78 QT Interval:  386 QTC Calculation: 419 R Axis:   92 Text Interpretation: Normal sinus rhythm Rightward axis Nonspecific ST and T wave abnormality Baseline wander Since last tracing 28 Apr 2020 T wave inversion now evident in septal leads Confirmed by Rolland Porter ([id number]) on 05/19/2020 2:13:07 AM   Radiology DG Chest Port 1 View  Result Date: 05/18/2020 CLINICAL DATA:  04/28/2020 EXAM: PORTABLE CHEST 1 VIEW COMPARISON:  04/28/2020, CT 04/19/2020 FINDINGS: Minimal lingular atelectasis. Interstitial prominence is again noted may reflect changes related to underlying interstitial lung disease. No confluent pulmonary infiltrate. No pneumothorax or pleural effusion. Cardiac size within normal limits. Central pulmonary arteries are enlarged in keeping with changes of pulmonary arterial hypertension. No acute bone abnormality. IMPRESSION: 1. Minimal lingular atelectasis. 2. Enlarged central pulmonary arteries in keeping with changes of pulmonary arterial hypertension. Electronically Signed   By: Fidela Salisbury MD   On: 05/18/2020 21:48    Procedures Procedures (including critical care time)  Medications Ordered in ED Medications - No data to display  ED Course  I have reviewed the triage vital signs and the nursing notes.  Pertinent labs & imaging results that were available during my care of the patient were reviewed by me and considered in my  medical decision making (see chart for details).    MDM Rules/Calculators/A&P                          Review of patient's chart shows he had a cardiac cath done on March 23 when he was admitted for an NSTEMI.  He had a 30% narrowing of his left main, minimal irregularity of the RCA and LAD and the left circumflex was normal.  Cardiology recommendation by Dr. Paulita Fujita was for aspirin 81 mg a day and to start him on a statin.  Patient's lab do not show any reason for his chest pains he has been having for the past 7 or 8 months.  Patient already has had a  cardiac cath.  We will try nitroglycerin in case he is having some coronary artery spasm or esophageal spasm.  He can be reevaluated by his lung specialist and he was given the number for cardiology in [place].  Final Clinical Impression(s) / ED Diagnoses Final diagnoses:  Atypical chest pain    Rx / DC Orders ED Discharge Orders         Ordered    nitroGLYCERIN (NITROSTAT) 0.4 MG SL tablet  Every 5 min PRN        05/19/20 0530         Plan discharge  Rolland Porter, MD, Barbette Or, MD 05/19/20 [phone]

## 2020-05-19 NOTE — Discharge Instructions (Addendum)
Try the nitroglycerin pills for your chest discomfort.  You had a normal cardiac catheterization in March.  If you want to get the advice of the cardiologist again I gave you their number.  You could also talk to your lung specialist, this may be related to your pulmonary hypertension that you have.  Return to the emergency department if you get chest pain that is lasting constantly more than 30 minutes.

## 2020-05-20 ENCOUNTER — Encounter (HOSPITAL_COMMUNITY): Payer: Medicare Other

## 2020-05-26 NOTE — Patient Outreach (Signed)
[facility]) Care Management  05/26/2020  Bryan [name] [birth year] [id number]   Late entry for 04/30/20   THNoutreach to complex care patient  Bryan [name] was initially referred to [facility] on 10/07/19 as an [facility] UM referral for assistance with finding an affordable housing.[facility] SW services has been provided. Bryan [name] needs to follow up on the [facility] SW resources provided to him. He continues to state he was not preferring apartment living, has completed the forms and returned them to the local housing department but has not routinely followed up.  Insurance:[employer] medicare   Patient is able to verify HIPAA ([place] and Accountability Act) identifiers Reviewed and addressed the purpose of the follow up call with the patient  Consent: [facility] RN CM reviewed [facility] services with patient. Patient gave verbal consent for services.  congestive Heart Failure (CHF) Follow up Bryan [name] reports he was informed during his last ED visit that he had "fluid around my lungs"  He reports his action plan consists of a fluid restriction of 60 ounces  He and [facility] RN CM discussed what 60 ounces looked like via containers in his home He reports he is afraid to drink coffee and milk. He discussed intake of ice the is not measured He states he does not eat salty foods but frequent restaurant [facility] RN CM discussed increased sodium foods purchased in restaurants   [facility] RN CM discussed with him to cook at home more He discusses concern with heat from his oven that triggers his symptoms He states she weighs daily His wt today was 118.8 lbs   [facility] RN CM reviewed his After summary sheet/hospital discharge sheet with him [facility] RN CM and patient identified he has not followed up with a cardiologist He was referred to his pcp for referral to cardiologist The importance of cardiology follow up was discussed   [facility] RN CM will follow up with Bryan [name] within  30 business days   [place] L. [name], RN, BSN, [place] Coordinator Office number [phone] Main [facility] number [phone] Fax number [phone]

## 2020-05-27 ENCOUNTER — Other Ambulatory Visit: Payer: Self-pay | Admitting: *Deleted

## 2020-05-27 NOTE — Patient Outreach (Signed)
[facility]) Care Management  05/27/2020  Bryan [name] 09-05-1956 [id number]   Bryan [name]   [name] attempt to the home number  No answer. [facility] RN CM left HIPAA [facility] Portability and Accountability Act) compliant voicemail message along with CM's contact info.   Plan: [facility] RN CM scheduled this patient for another call attempt within 30 business days  Bryan [name] L. [name], RN, BSN, [place] Coordinator Office number [phone] Mobile number [phone]  Main Bryan number [phone] Fax number [phone]

## 2020-06-02 ENCOUNTER — Other Ambulatory Visit: Payer: Self-pay | Admitting: *Deleted

## 2020-06-02 ENCOUNTER — Ambulatory Visit (INDEPENDENT_AMBULATORY_CARE_PROVIDER_SITE_OTHER): Payer: Medicare Other | Admitting: Internal Medicine

## 2020-06-02 ENCOUNTER — Other Ambulatory Visit: Payer: Self-pay

## 2020-06-02 ENCOUNTER — Encounter: Payer: Self-pay | Admitting: Internal Medicine

## 2020-06-02 ENCOUNTER — Telehealth: Payer: Self-pay | Admitting: *Deleted

## 2020-06-02 VITALS — BP 104/69 | HR 98 | Temp 97.1°F | Ht 63.0 in | Wt 121.8 lb

## 2020-06-02 DIAGNOSIS — R1010 Upper abdominal pain, unspecified: Secondary | ICD-10-CM

## 2020-06-02 DIAGNOSIS — K219 Gastro-esophageal reflux disease without esophagitis: Secondary | ICD-10-CM | POA: Diagnosis not present

## 2020-06-02 DIAGNOSIS — R1084 Generalized abdominal pain: Secondary | ICD-10-CM

## 2020-06-02 NOTE — Progress Notes (Signed)
Primary Care Physician:  Rosita Fire, MD Primary Gastroenterologist:  Dr. Gala Romney  Pre-Procedure History & Physical: HPI:  Bryan [name] is a 64 y.o. male here for further evaluation of chronic abdominal pain.  Abdominal pain for at least a year.  Bandlike upper abdominal pain.  Sometimes precipitated and persists after a regular bowel movement.  Not associated with eating.  He states he can walk 50 feet and he has abdominal pain  - states this is reproducible.  When he stops it gets better.  Denies melena or rectal bleeding.  Recent hospitalization for congestive heart failure.  No NSAIDs.  Peptic ulcer found earlier in the year and severe reflux esophagitis.  H. pylori Bx negative.  I performed an EGD and a colonoscopy (suggestive thickening of left colon on CT).  Small adenoma removed from his colon no colitis.  Reflux esophagitis and peptic ulcer disease completely healed in August of this year. Gallbladder ultrasound in the distant past negative;  HIDA 2015 gallbladder EF 99% with fatty meal challenge. Does not have a radicular component.  Says his abdominal symptoms are not progressive. Patient states reflux well controlled on Protonix 40 mg once daily.  Has a normal bowel movement daily -  on Linzess 72. Weight down 2 pound since July 21 visit. No fear of eating and no pain with eating.. Grossly patent vasculature with atherosclerosis on prior CTs.  Past Medical History:  Diagnosis Date  . Acid reflux   . Arthritis   . Asthma   . Chronic pain    with leg and back pain (disc problem)  . COPD (chronic obstructive pulmonary disease) ([place])   . Diabetes mellitus without complication ([place])   . Headache    HX OF  . History of upper GI x-ray series    to follow showed large duodenal ulcer H pylori serologies were negative  . Hypertension   . NSTEMI (non-ST elevated myocardial infarction) ([place]) 11/2019  . Pneumonia    07/17/17  . Pre-diabetes   . Stroke [facility])    TIA MINI STROKE  .  Tubular adenoma     Past Surgical History:  Procedure Laterality Date  . BACK SURGERY  7/05; 5/09    Dr.Hirsch,3 lumbar  X3  . BACK SURGERY    . COLONOSCOPY  Feb 2012   Dr. Gala Romney: normal rectum, pedunculate polyp removed but not recovered  . COLONOSCOPY WITH ESOPHAGOGASTRODUODENOSCOPY (EGD) N/A 11/18/2013   Dr.Lajuanda Penick- tcs= normal rectum, multipe polyps about the ileocecal valve and distal transverse colon o/w the remainder of the colonic mucosa appeared normal bx= tubular adenoma. EGD= normal esophagus, stomach with scattered erosions mottling, friablility, no ulcer or infiltrating process patent pylorus bx= chronic inflammation. next TCS 11/2016  . COLONOSCOPY WITH PROPOFOL N/A 05/25/2017   Dr. Gala Romney: sigmoid diverticulosis, one 4 mm hyerplastic rectal polyp, ascending colonic AVMs surveillance 2023  . COLONOSCOPY WITH PROPOFOL N/A 04/16/2020   Procedure: COLONOSCOPY WITH PROPOFOL;  Surgeon: Daneil Dolin, MD;  [facility];  Service: Endoscopy;  Laterality: N/A;  . ESOPHAGOGASTRODUODENOSCOPY  1/06   Dr. Aashna Matson:Distal esophageal erosions,U-shaped stomach,marked erosions and edema of the bulb without discrete ulcer disease.   . ESOPHAGOGASTRODUODENOSCOPY (EGD) WITH PROPOFOL N/A 05/25/2017   Dr. Gala Romney: reflux esophagitis s/p empiric dilation, normal stomach and duodenum  . ESOPHAGOGASTRODUODENOSCOPY (EGD) WITH PROPOFOL Left 11/30/2019   LA Grade D esophagitis, small hiatal hernia, one non-bleeding cratered gastric ulcer in prepyloric region of stomach, many non-bleeding cratered duodenal ulcers without stigmata  of bleeding, mild luminal narrowing at apex of duodenal bulb but traversed easily. H.pylori serology negative.  . ESOPHAGOGASTRODUODENOSCOPY (EGD) WITH PROPOFOL N/A 04/16/2020   Procedure: ESOPHAGOGASTRODUODENOSCOPY (EGD) WITH PROPOFOL;  Surgeon: Daneil Dolin, MD;  [facility];  Service: Endoscopy;  Laterality: N/A;  . Venia Minks DILATION N/A 05/25/2017   Procedure: Venia Minks  DILATION;  Surgeon: Daneil Dolin, MD;  [facility];  Service: Endoscopy;  Laterality: N/A;  . NECK SURGERY  10/2007   PLATE IN NECK  . POLYPECTOMY  05/25/2017   Procedure: POLYPECTOMY;  Surgeon: Daneil Dolin, MD;  [facility];  Service: Endoscopy;;  rectal  . RIGHT/LEFT HEART CATH AND CORONARY ANGIOGRAPHY N/A 12/03/2019   Procedure: RIGHT/LEFT HEART CATH AND CORONARY ANGIOGRAPHY;  Surgeon: Nigel Mormon, MD;  Location: [facility];  Service: Cardiovascular;  Laterality: N/A;  . TRANSURETHRAL RESECTION OF PROSTATE N/A 08/17/2017   Procedure: TRANSURETHRAL RESECTION OF THE PROSTATE (TURP);  Surgeon: Irine Seal, MD;  Location: [facility];  Service: Urology;  Laterality: N/A;    Prior to Admission medications   Medication Sig Start Date End Date Taking? Authorizing Provider  acetaminophen (TYLENOL) 325 MG tablet Take 2 tablets (650 mg total) by mouth every 6 (six) hours as needed for mild pain, fever or headache (or Fever >/= 101). 04/22/20  Yes Emokpae, Courage, MD  albuterol (PROVENTIL) (2.5 MG/3ML) 0.083% nebulizer solution Take 3 mLs (2.5 mg total) by nebulization every 4 (four) hours as needed for wheezing or shortness of breath. 04/22/20  Yes Emokpae, Courage, MD  albuterol (VENTOLIN HFA) 108 (90 Base) MCG/ACT inhaler Inhale 2 puffs into the lungs every 4 (four) hours as needed for wheezing or shortness of breath. Do not use with nebulizer 04/22/20  Yes Emokpae, Courage, MD  calcium carbonate (TUMS - DOSED IN MG ELEMENTAL CALCIUM) 500 MG chewable tablet Chew 2 tablets (400 mg of elemental calcium total) by mouth 3 (three) times daily as needed for indigestion or heartburn. 12/04/19  Yes Amin, Ankit Chirag, MD  Fluticasone-Umeclidin-Vilant (TRELEGY ELLIPTA) 100-62.5-25 MCG/INH AEPB Inhale 1 puff into the lungs daily. 06/14/19  Yes Emokpae, Courage, MD  gabapentin (NEURONTIN) 300 MG capsule Take 1 capsule (300 mg total) by mouth 2 (two) times daily. Pt. Says he is  taking twice daily. Patient taking differently: Take 600 mg by mouth in the morning, at noon, and at bedtime.  06/14/19  Yes Emokpae, Courage, MD  guaiFENesin (MUCINEX) 600 MG 12 hr tablet Take 1 tablet (600 mg total) by mouth 2 (two) times daily. 04/22/20 04/22/21 Yes Roxan Hockey, MD  linaclotide (LINZESS) 72 MCG capsule Take 1 capsule (72 mcg total) by mouth daily before breakfast. Patient taking differently: Take 72 mcg by mouth daily as needed (constipation.).  06/14/19  Yes Emokpae, Courage, MD  magnesium oxide (MAG-OX) 400 MG tablet Take 400 mg by mouth daily.   Yes [provider]  methocarbamol (ROBAXIN) 500 MG tablet Take 1 tablet (500 mg total) by mouth every 8 (eight) hours as needed for muscle spasms. 04/01/20  Yes Maudie Flakes, MD  metoprolol tartrate (LOPRESSOR) 25 MG tablet Take 0.5 tablets (12.5 mg total) by mouth 2 (two) times daily. 12/04/19 03/20/21 Yes Amin, Jeanella Flattery, MD  nitroGLYCERIN (NITROSTAT) 0.4 MG SL tablet Place 1 tablet (0.4 mg total) under the tongue every 5 (five) minutes as needed for chest pain. 05/19/20  Yes Rolland Porter, MD  OXYGEN Inhale 2.5 L into the lungs daily.   Yes [provider]  pantoprazole (PROTONIX) 40 MG tablet Take 1 tablet (40 mg total) by mouth 2 (two) times daily before a meal. Patient taking differently: Take 40 mg by mouth in the morning and at bedtime.  12/04/19 03/20/21 Yes Amin, Ankit Chirag, MD  sucralfate (CARAFATE) 1 GM/10ML suspension Take 10 mLs (1 g total) by mouth 4 (four) times daily -  with meals and at bedtime. 04/22/20  Yes Emokpae, Courage, MD  torsemide (DEMADEX) 20 MG tablet Take 1 tablet (20 mg total) by mouth 2 (two) times daily. For Fluid/Heart 04/22/20  Yes Emokpae, Courage, MD  traMADol (ULTRAM) 50 MG tablet Take 1 tablet (50 mg total) by mouth every 6 (six) hours as needed. 03/30/20  Yes Virgel Manifold, MD  lidocaine (LIDODERM) 5 % Place 1 patch onto the skin daily. Remove & Discard patch within 12 hours or as  directed by MD Patient not taking: Reported on 06/02/2020 04/01/20   Maudie Flakes, MD    Allergies as of 06/02/2020  . (No Known Allergies)    Family History  Problem Relation Age of Onset  . Cancer Father   . Asthma Mother   . Colon cancer Neg Hx     Social History   Socioeconomic History  . Marital status: Single    Spouse name: Not on file  . Number of children: 2  . Years of education: 26  . Highest education level: High school graduate  Occupational History  . Occupation: disabled    Fish farm manager: UNEMPLOYED  Tobacco Use  . Smoking status: Former Smoker    Packs/day: 0.50    Years: 40.00    Pack years: 20.00    Types: Cigarettes    Quit date: 11/11/2014    Years since quitting: 5.5  . Smokeless tobacco: Never Used  Vaping Use  . Vaping Use: Never used  Substance and Sexual Activity  . Alcohol use: No    Alcohol/week: 0.0 standard drinks  . Drug use: Not Currently    Types: Marijuana    Comment: occas; denied 01/28/20  . Sexual activity: Not Currently    Birth control/protection: None  Other Topics Concern  . Not on file  Social History Narrative   Lives with girlfriend and son, is on disability for history of back injuries.   Social Determinants of Health   Financial Resource Strain: Medium Risk  . Difficulty of Paying Living Expenses: Somewhat hard  Food Insecurity: No Food Insecurity  . Worried About Charity fundraiser in the Last Year: Never true  . Ran Out of Food in the Last Year: Never true  Transportation Needs: No Transportation Needs  . Lack of Transportation (Medical): No  . Lack of Transportation (Non-Medical): No  Physical Activity: Inactive  . Days of Exercise per Week: 0 days  . Minutes of Exercise per Session: 0 min  Stress: No Stress Concern Present  . Feeling of Stress : Not at all  Social Connections: Socially Isolated  . Frequency of Communication with Friends and Family: More than three times a week  . Frequency of Social  Gatherings with Friends and Family: More than three times a week  . Attends Religious Services: Never  . Active Member of Clubs or Organizations: No  . Attends Archivist Meetings: Never  . Marital Status: Never married  Intimate Partner Violence: Not At Risk  . Fear of Current or Ex-Partner: No  . Emotionally Abused: No  . Physically Abused: No  . Sexually Abused: No  Review of Systems: See HPI, otherwise negative ROS  Physical Exam: BP 104/69   Pulse 98   Temp (!) 97.1 F (36.2 C) (Oral)   Ht _0  (1.6 m)   Wt 121 lb 12.8 oz (55.2 kg)   BMI 21.58 kg/m  Nasal O2 in place. General:   Alert,  Well-developed, well-nourished, pleasant and cooperative in NAD gnificant cervical adenopathy. Lungs:  Clear throughout to auscultation.   No wheezes, crackles, or rhonchi. No acute distress. Heart:  Regular rate and rhythm; no murmurs, clicks, rubs,  or gallops. Abdomen: Non-distended, normal bowel sounds.  Soft and nontender without appreciable mass or hepatosplenomegaly.  Pulses:  Normal pulses noted. Extremities:  Without clubbing or edema.  Impression/Plan: 64 year old with chronic nonspecific abdominal pain.  Typical reflux symptoms well controlled on PPI.  Constipation well managed with Linzess 72.  Although he does not have postprandial abdominal pain, he notes abdominal pain reproduced with exertion which is interesting.  He has not really lost much weight.  Does not sound like acid peptic disease or biliary etiology or ischemia.  Recommendations:  Proceed with a CTA abdomen to more definitively evaluate for potential underlying mesenteric ischemia  Continue Protonix 40 mg daily to control reflux  Continue Linzess 72 daily as this is working well for constipation  Further recommendations to follow.     Notice: This dictation was prepared with Dragon dictation along with smaller phrase technology. Any transcriptional errors that result from this process are  unintentional and may not be corrected upon review.

## 2020-06-02 NOTE — Patient Instructions (Signed)
CT angiogram abdomen - evaluate for mesenteric insufficiency  Continue Protonix daily  Continue linzess 72 daily  Further recommendations to follow

## 2020-06-02 NOTE — Telephone Encounter (Signed)
CTA abd w/ w/o contrast scheduled for 10/6 at 11:00am, arrival 10:45am, water only 4 hrs prior.  Called pt and LMOVM detailed message. Letter mailed

## 2020-06-03 ENCOUNTER — Other Ambulatory Visit: Payer: Self-pay | Admitting: *Deleted

## 2020-06-03 NOTE — Patient Outreach (Signed)
[place] [facility]) Care Management  06/03/2020  Bryan [name] 08/06/1956 [id number]  THNoutreach to complex care patient  Bryan [name] was initially referred to [facility] on 10/07/19 as an [facility] UM referral for assistance with finding an affordable housing.[facility] SW services has been provided. Bryan [name] needs to follow up on the [facility] SW resources provided to him. He continues to state he was not preferring apartment living, has completed the forms and returned them to the local housing department but has not routinely followed up.  Insurance:[employer] medicare 05/19/20 ED chest pain, 04/28/20 ED chest pain 04/19/20-04/22/20 ED to admission CHF  04/08/20 ED abdominal pain   Patient is able to verify HIPAA (Bryan [name]) identifiers Reviewed and addressed the purpose of the follow up call with the patient  Consent: [facility]) RN CM reviewed [place] with patient. Patient gave verbal consent for services.  Bryan [name] reports he feels good but reports shortness of breath (sob) and abdominal pain issues when he bends to get in his car and exertion He reports pain in the center of his chest that he reports begins to hurt on the left side of his chest when he takes nitroglycerin (NTG)  He denies GERD (gastroesophageal reflux disease) or heartburn  He reports he is taking all his medications as ordered    He has not made or attended a cardiology appointment as directed in his 04/22/20 discharge instructions There are 2 noted cancelled cardiac rehab appointments (05/06/20-patient cancelled and 05/20/20-in ED on 05/19/20-provider cancelled) Next scheduled on 06/25/20  He does not have a follow up pulmonary appointment (Last visit 02/17/20, recommended follow up in 4 months)   He has been to see his gastroenterologist on 06/02/20 who is recommending a CTA abdomen to more definitively evaluate for potential underlying mesenteric ischemia CTA  abd w/ w/o contrast scheduled for 10/6 at 11:00 am, arrival 10:45 am, water only 4 hrs prior It is noted that Bryan [name] was called on 06/02/20 and a message was left for him and a letter sent to his home address  [facility] RN CM discussed the importance of attending his appointments for frequent monitoring and to assist with identifying the cause of his symptoms He voiced understanding [facility] RN CM encouraged him to outreach to make his pulmonology appointment He agreed to have [facility] RN CM assist with a morning cardiology appointment  Plan [facility] RN CM will follow up with Bryan [name] within the next 30 days to confirms appointments  Scheduled Pt encouraged to return a call to [facility] RN CM prn Routed note to MD Goals Addressed              This Visit's Progress     Patient Stated   .  [facility]) patient will be able to manage his COPD, CHF, HTN, DM at home (pt-stated)   Not on track     [place] (see longitudinal plan of care for additional care plan information)  Current Barriers:  Marland Kitchen Knowledge deficits related to basic COPD, DM, HTN, CHF self care/management . Cognitive Deficits . Reports he needs better housing to assist with respiratory health (old home, roofing/heating concerns)   Case Manager Clinical Goal(s):  Over the next 60 days, patient will be able to verbalize understanding of COPD, CHF, DM, HTN action plan and when to seek appropriate levels of medical care 06/03/20 Revised  Over the next 90 days, patient will verbalize basic understanding of COPD disease process  and self care activities  Over the next 45 days patient will attend cardiology, pulmonology appointments As evidence by EMR documented visits   Interventions:   Provided patient with basic written and verbal COPD, DM, CHF, HTN education on self care/management/and exacerbation prevention   Provided patient with COPD, DM, CHF, HTN action plan and reinforced importance of daily self assessment  Provided instruction about  proper use of medications used for management of COPD including inhalers  Advised patient to self assesses COPD, DM, CHF, HTN action plan zone and make appointment with provider if in the yellow zone for 48 hours without improvement.  Follow up  admissions and ED visits  Care coordination of appointment, DME  Encouraged him to follow up on housing resources  Encouraged him to attend cardiology, pulmonology appointments  Patient Self Care Activities:  . Takes medications as prescribed including inhalers . Self assesses COPD, DM, CHF, HTN action plan zones and makes appointment with provider if in the yellow zone for 48 hours without improvement. . Does not adhere to prescribed medication regimen . Does not contact provider office for questions/concerns but goes to ED with worsening symptoms . Outreach to scheduled pulmonology appointment as recommended  . Attend all recommended appointments and schedule appointments  Please see past updates related to this goal by clicking on the "Past Updates" button in the selected goal  Please see other previous [facility] care plan information listed in Epic under the flow sheet section          Billyjack Trompeter L. [name], RN, BSN, [place] Coordinator Office number [phone] Main [facility] number [phone] Fax number [phone]

## 2020-06-05 ENCOUNTER — Telehealth: Payer: Self-pay | Admitting: Pulmonary Disease

## 2020-06-05 ENCOUNTER — Other Ambulatory Visit: Payer: Self-pay | Admitting: *Deleted

## 2020-06-05 NOTE — Patient Outreach (Signed)
[place] [facility]) Care Management  06/05/2020  Bryan [name] September 22, 1955 [id number]   [facility]) Care Management care coordination- pulmonary rehab  [facility] RN CM received a return call from [place], [place] to confirm pt is scheduled for pulmonary/cardiac rehab on 06/25/20 after a referral was placed in June 2021  Also with noted 2 cancellations one from pt and one from provider (pt in ED)  Plan [facility] RN CM willfollow up with [name] within the next 30 days Pt encouraged to return a call to [facility] RN CM prn Goals Addressed              This Visit's Progress     Patient Stated   .  [facility]) patient will be able to manage his COPD, CHF, HTN, DM at home (pt-stated)        [place] (see longitudinal plan of care for additional care plan information)  Current Barriers:  Marland Kitchen Knowledge deficits related to basic COPD, DM, HTN, CHF self care/management . Cognitive Deficits . Reports he needs better housing to assist with respiratory health (old home, roofing/heating concerns)   Case Manager Clinical Goal(s):  Over the next 60 days, patient will be able to verbalize understanding of COPD, CHF, DM, HTN action plan and when to seek appropriate levels of medical care 06/03/20 Revised  Over the next 90 days, patient will verbalize basic understanding of COPD disease process and self care activities  Over the next 45 days patient will attend cardiology, pulmonology appointments As evidence by EMR documented visits   Interventions:   Provided patient with basic written and verbal COPD, DM, CHF, HTN education on self care/management/and exacerbation prevention   Provided patient with COPD, DM, CHF, HTN action plan and reinforced importance of daily self assessment  Provided instruction about proper use of medications used for management of COPD including inhalers  Advised patient to self assesses COPD, DM, CHF, HTN action plan zone and make appointment with provider  if in the yellow zone for 48 hours without improvement.  Follow up  admissions and ED visits  Care coordination of appointment, DME  Encouraged him to follow up on housing resources  Encouraged him to attend cardiology, pulmonology appointments  06/05/20 assisted with new cardiology and follow up pulmonology appointments   Discussed primary care services  06/06/23 collaboration with pulmonology RN related to pulmonary rehab    Patient Self Care Activities:  . Takes medications as prescribed including inhalers . Self assesses COPD, DM, CHF, HTN action plan zones and makes appointment with provider if in the yellow zone for 48 hours without improvement. . Does not adhere to prescribed medication regimen . Does not contact provider office for questions/concerns but goes to ED with worsening symptoms . Outreach to scheduled pulmonology appointment as recommended  . Attend all recommended appointments and schedule appointments  Please see past updates related to this goal by clicking on the "Past Updates" button in the selected goal  Please see other previous [facility] care plan information listed in Epic under the flow sheet section         Adelisa Satterwhite L. [name], RN, BSN, [place] Coordinator Office number [phone] Main [facility] number [phone] Fax number [phone]

## 2020-06-05 NOTE — Patient Outreach (Signed)
[facility]) Care Management  06/05/2020  CHIDI SHIRER 1956/01/01 [id number]   [facility] Care coordination-cardiology  [facility] RN CM called [place] heart care as recommended by ED MD on 05/19/20 for patient  Spoke with Romie Minus to make pt a new patient appointment (earliest) for July 06 2020  at 0840 with Gwyndolyn Kaufman at [address] [place]  First available in [place] or [place] is on September 14 2020 per Carter Kitten with in [place] in [place] first available is August 03 2020 but would cancel the October appointment Outreach to pulmonology office spoke with Maudie Mercury to schedule 4 month follow up appointment with [name] on June 19 2020 1045  [place] inquired about pulmonary rehab  A message was sent to the nurse   Outreach to patient  Patient is able to verify HIPAA ([place] and Accountability Act) identifiers Reviewed and addressed the purpose of the follow up call with the patient  Consent: [facility] RN CM reviewed [facility] services with patient. Patient gave verbal consent for services. He reports seeing pcp 2-3 weeks ago Updated him on his pulmonology and cardiology appointments Prefers earliest cardiology appointment at this time  [place] discussed the importance of accessing local specialist for routine follow up and the noted frequent ED visits He was encouraged to take his cardiac and COPD medication daily as ordered   Plan [facility] RN CM will follow up with Mr Hing within the next 30 days Pt encouraged to return a call to [facility] RN CM prn Goals Addressed              This Visit's Progress     Patient Stated   .  [facility]) patient will be able to manage his COPD, CHF, HTN, DM at home (pt-stated)   Not on track     [place] (see longitudinal plan of care for additional care plan information)  Current Barriers:  Marland Kitchen Knowledge deficits related to basic COPD, DM, HTN, CHF self  care/management . Cognitive Deficits . Reports he needs better housing to assist with respiratory health (old home, roofing/heating concerns)   Case Manager Clinical Goal(s):  Over the next 60 days, patient will be able to verbalize understanding of COPD, CHF, DM, HTN action plan and when to seek appropriate levels of medical care 06/03/20 Revised  Over the next 90 days, patient will verbalize basic understanding of COPD disease process and self care activities  Over the next 45 days patient will attend cardiology, pulmonology appointments As evidence by EMR documented visits   Interventions:   Provided patient with basic written and verbal COPD, DM, CHF, HTN education on self care/management/and exacerbation prevention   Provided patient with COPD, DM, CHF, HTN action plan and reinforced importance of daily self assessment  Provided instruction about proper use of medications used for management of COPD including inhalers  Advised patient to self assesses COPD, DM, CHF, HTN action plan zone and make appointment with provider if in the yellow zone for 48 hours without improvement.  Follow up  admissions and ED visits  Care coordination of appointment, DME  Encouraged him to follow up on housing resources  Encouraged him to attend cardiology, pulmonology appointments  06/05/20 assisted with new cardiology and follow up pulmonology appointments  Discussed primary care services  Patient Self Care Activities:  . Takes medications as prescribed including inhalers . Self assesses COPD, DM, CHF, HTN  action plan zones and makes appointment with provider if in the yellow zone for 48 hours without improvement. . Does not adhere to prescribed medication regimen . Does not contact provider office for questions/concerns but goes to ED with worsening symptoms . Outreach to scheduled pulmonology appointment as recommended  . Attend all recommended appointments and schedule  appointments  Please see past updates related to this goal by clicking on the "Past Updates" button in the selected goal  Please see other previous [facility] care plan information listed in Epic under the flow sheet section         Lakeyshia Tuckerman L. [name], RN, BSN, [place] Coordinator Office number [phone] Mobile number [phone]  Main [facility] number [phone] Fax number [phone]

## 2020-06-05 NOTE — Telephone Encounter (Signed)
Spoke with Maudie Mercury at [facility]  I advised that we did refer the pt to pulmonary rehab back in June 2021  He has appt for orientation with them on 06/25/20  Nothing further needed

## 2020-06-16 ENCOUNTER — Telehealth: Payer: Self-pay | Admitting: *Deleted

## 2020-06-16 ENCOUNTER — Other Ambulatory Visit: Payer: Self-pay | Admitting: *Deleted

## 2020-06-16 DIAGNOSIS — R1084 Generalized abdominal pain: Secondary | ICD-10-CM

## 2020-06-16 NOTE — Telephone Encounter (Signed)
MRA scheduled for 10/15 at 2:00pm, arrival 1:30pm, npo 4 hrs prior.  Called pt and he is aware of appt details.

## 2020-06-16 NOTE — Telephone Encounter (Signed)
Pt aware CTA cancelled for tomorrow. We are going to persue MRA abd w/o contrast. Pt agreeable to MRA. Per Gaetana Michaelis this was approved by MRI Supervisor to have done.

## 2020-06-16 NOTE — Telephone Encounter (Signed)
Per Gaetana Michaelis with CT at [facility]: Good afternoon. the attached patient is on the schedule tomorrow for an CT Angio. Patient's creatinine is 2.46 with a eGFR of 29. We can not give contrast to a patient with an eGFR of below 30. I did not see any documentation of dialysis. Do you want to change the order to CT Abdomen and Pelvis without IV contrast and just have patient drink oral contrast or cancel for now?  ----  Please advise Dr. Gala Romney. thanks

## 2020-06-16 NOTE — Addendum Note (Signed)
Addended by: Cheron Every on: 06/16/2020 04:31 PM   Modules accepted: Orders

## 2020-06-17 ENCOUNTER — Ambulatory Visit (HOSPITAL_COMMUNITY): Admission: RE | Admit: 2020-06-17 | Payer: Medicare Other | Source: Ambulatory Visit

## 2020-06-17 ENCOUNTER — Other Ambulatory Visit: Payer: Self-pay

## 2020-06-17 ENCOUNTER — Other Ambulatory Visit: Payer: Self-pay | Admitting: *Deleted

## 2020-06-17 NOTE — Telephone Encounter (Signed)
Thanks

## 2020-06-17 NOTE — Patient Outreach (Signed)
[facility]) Care Management  06/17/2020  Bryan [name] Apr 09, 1956 [id number]   THNoutreach to complex care patient  Bryan [name] was initially referred to [facility] on 10/07/19 as an [facility] referral for assistance with finding an affordable housing.[facility] SW services has been provided. Bryan [name] needs to follow up on the [facility] SW resources provided to him. He continues to state he was not preferring apartment living, has completed the forms and returned them to the local housing department but has not routinely followed up.  Insurance:[employer]ited [employer] medicare  Patient is able to verify HIPAA ([place] and Accountability Act) identifiers Reviewed and addressed the purpose of the follow up call with the patient  Consent: [facility]) RN CM reviewed [facility] services with patient. Patient gave verbal consent for services.  Last ED visit on 05/19/20 for atypical chest pain ED 04/28/20 for non specific chest pain 04/19/20 for respiratory failure with admission  04/08/20 with chronic gastritis  Cardiac/pulmonary rehab [facility] RN CM discussed the importance of Bryan [name] attendance to the 06/25/20 cardiac/pulmonary rehab session Discussed the benefit of sessions to identify the causes of his respiratory and cardiac symptoms reported during the recent ED visits for atypical chest pain and his voiced concern that he has a pain in the "right side of my chest" when walking  He voiced understanding   Upcoming GI test/imaging Discussed the CT that was changed to a MRI Bryan [name] reports he does not tolerate small closed spaces He prefers open MRIs related reported feelings of "shortness of breath" (sob)  [facility] RN CM discussed the differences in a closed and open MRI  He plans to stop by Dr Sydell Axon, gastroenterologist office to discuss his preference for an open MRI   Cardiology providers Bryan [name] reports an outreach this week to schedule him to see cardiologist Dr  Domenic Polite He reports the first visit is to be in January 2022 He is scheduled to see Dr Johney Frame on 07/06/20   Preventive care   He reports he wears reading glasses and had an eye exam at [facility] in [place] about 3-4 months ago He reports not getting enough exercise related to shortness of breath (sob)  Discussed the importance of exercise for his chronic medical issues with again encouragement of participation in upcoming cardiac/pulmonary rehab He reports no drinking of alcohol or smoking  Housing He still would like to have new housing but has not outreached lately to resources that have been provided He reports h is not sure if the home he lives in has mold "I don't know" Discussed how this could affect his chronic medical issues His home was built prior to [place] appointments  06/19/20 [name] pulmonology 06/19/20 Dr Legrand Rams PCP 06/25/20 cardiac pulmonary rehab  06/26/20 MRI for GI symptoms  07/06/20 new cardiology appointment with Dr Johney Frame 09/16/20 new Cardiology appointment with Dr Rozann Lesches  [facility] RN CM will follow up with Bryan [name] within the next 30 business days and prn Pt encouraged to return a call to [facility] RN CM prn Routed note to MD Goals Addressed              This Visit's Progress     Patient Stated   .  [facility]) Improve My Heart Health (pt-stated)        Follow Up Date 06/26/20    - be open to making changes - if I have chest pain, call for help - learn about small changes  that will make a big difference    Why is this important?   Lifestyle changes are key to improving the blood flow to your heart. Think about the things you can change and set a goal to live healthy.  Remember, when the blood vessels to your heart start to get clogged you may not have any symptoms.  Over time, they can get worse.  Don't ignore the signs, like chest pain, and get help right away.     Notes:     .  [facility]) patient will be able to manage his COPD, CHF, HTN, DM  at home (pt-stated)        [place] (see longitudinal plan of care for additional care plan information)  Current Barriers:  Marland Kitchen Knowledge deficits related to basic COPD, DM, HTN, CHF self care/management . Cognitive Deficits . Reports he needs better housing to assist with respiratory health (old home, roofing/heating concerns)   Case Manager Clinical Goal(s):  Over the next 60 days, patient will be able to verbalize understanding of COPD, CHF, DM, HTN action plan and when to seek appropriate levels of medical care 06/03/20 Revised  Over the next 90 days, patient will verbalize basic understanding of COPD disease process and self care activities  Over the next 45 days patient will attend cardiology, pulmonology appointments As evidence by EMR documented visits  06/17/20 progressing and pending upcoming cardiology new patient appointments and cardiac/pulmonary rehab - Initiated patient care plan (Elsevier)   Interventions:   Provided patient with basic written and verbal COPD, DM, CHF, HTN education on self care/management/and exacerbation prevention   Provided patient with COPD, DM, CHF, HTN action plan and reinforced importance of daily self assessment  Provided instruction about proper use of medications used for management of COPD including inhalers  Advised patient to self assesses COPD, DM, CHF, HTN action plan zone and make appointment with provider if in the yellow zone for 48 hours without improvement.  Follow up  admissions and ED visits  Care coordination of appointment, DME  Encouraged him to follow up on housing resources  Encouraged him to attend cardiology, pulmonology appointments  06/05/20 assisted with new cardiology and follow up pulmonology appointments   Discussed primary care services  06/06/23 collaboration with pulmonology RN related to pulmonary rehab    Patient Self Care Activities:  . Takes medications as prescribed including inhalers . Self  assesses COPD, DM, CHF, HTN action plan zones and makes appointment with provider if in the yellow zone for 48 hours without improvement. . Does not adhere to prescribed medication regimen . Does not contact provider office for questions/concerns but goes to ED with worsening symptoms . Outreach to scheduled pulmonology appointment as recommended  . Attend all recommended appointments and schedule appointments  Please see past updates related to this goal by clicking on the "Past Updates" button in the selected goal  Please see other previous [facility] care plan information listed in Epic under the flow sheet section         Pressley Tadesse L. [name], RN, BSN, [place] Coordinator Office number [phone] [address] [facility] number [phone] Fax number [phone]

## 2020-06-18 ENCOUNTER — Other Ambulatory Visit: Payer: Self-pay | Admitting: *Deleted

## 2020-06-18 NOTE — Telephone Encounter (Addendum)
Per Dr.Rourk- Patient cannot have a CTA because of renal issues. CT tech suggested MRA. She came back to me stating that small amount of contrast might be given for MRI to look at renal arteries. Please make it clear to them I am not interested in renal arteries. I am interested in mesenteric arteries to assess for ischemia. Far as I am concerned, patient should not have any IV contrast whatsoever for either MRI or CT. Please Clarify with the radiologist about getting MR of the mesenteric arteries. I would not order any contrast for either unless radiologist says otherwise.

## 2020-06-18 NOTE — Telephone Encounter (Signed)
Noted. Order has not been changed

## 2020-06-18 NOTE — Patient Outreach (Signed)
[facility]) Care Management  06/18/2020  [name] 06-03-1956 [id number]   [place] coordination- Clariification of cardiology appointment  [facility] RN CM outreached to Dr Domenic Polite office  Spoke with Bryan [name] about listed 09/16/20 cardiology appointment with Dr Domenic Polite  Discussed scheduled 07/06/20 cardiology appointment with Dr Johney Frame, cardiology. 09/16/20 appointment to be cancelled    Plan will update Bryan [name] when [facility] RN CM follow up with Bryan [name] within the next 30 business days and prn  [name], RN, BSN, [place] Coordinator Office number [phone] Mobile number [phone]  Main [facility] number [phone] Fax number [phone]

## 2020-06-19 ENCOUNTER — Ambulatory Visit: Payer: Medicare Other | Admitting: Pulmonary Disease

## 2020-06-22 ENCOUNTER — Other Ambulatory Visit: Payer: Self-pay

## 2020-06-22 ENCOUNTER — Encounter: Payer: Self-pay | Admitting: Pulmonary Disease

## 2020-06-22 ENCOUNTER — Ambulatory Visit (INDEPENDENT_AMBULATORY_CARE_PROVIDER_SITE_OTHER): Payer: Medicare Other | Admitting: Pulmonary Disease

## 2020-06-22 VITALS — BP 102/62 | HR 79 | Temp 97.1°F | Ht 63.0 in | Wt 120.8 lb

## 2020-06-22 DIAGNOSIS — I272 Pulmonary hypertension, unspecified: Secondary | ICD-10-CM

## 2020-06-22 DIAGNOSIS — R0789 Other chest pain: Secondary | ICD-10-CM | POA: Diagnosis not present

## 2020-06-22 NOTE — Patient Instructions (Signed)
Will schedule chest xray and echocardiogram  Can try using tylenol or ibuprofen as needed to help with chest discomfort  Follow up in 6 months

## 2020-06-22 NOTE — Progress Notes (Signed)
[place] [facility], [facility]  Chief Complaint  Patient presents with  . Follow-up    shortness of breath with exertion, dizziness    Constitutional:  BP 102/62 (BP Location: Left Arm, Cuff Size: Normal)   Pulse 79   Temp (!) 97.1 F (36.2 C) (Other (Comment)) Comment (Src): wrist  Ht _0  (1.6 m)   Wt 120 lb 12.8 oz (54.8 kg)   SpO2 93% Comment: 1.5L O2  BMI 21.40 kg/m   Past Medical History:  Chronic back and abdominal pain, CAD, CKD 3a, diastolic CHF, HTN, DM type 2, TIA  Past Surgical History:  His  has a past surgical history that includes Esophagogastroduodenoscopy (1/06); Neck surgery (10/2007); Colonoscopy (Feb 2012); Colonoscopy with esophagogastroduodenoscopy (egd) (N/A, 11/18/2013); Colonoscopy with propofol (N/A, 05/25/2017); Esophagogastroduodenoscopy (egd) with propofol (N/A, 05/25/2017); maloney dilation (N/A, 05/25/2017); polypectomy (05/25/2017); Back surgery (7/05; 5/09 ); Back surgery; Transurethral resection of prostate (N/A, 08/17/2017); Esophagogastroduodenoscopy (egd) with propofol (Left, 11/30/2019); RIGHT/LEFT HEART CATH AND CORONARY ANGIOGRAPHY (N/A, 12/03/2019); Colonoscopy with propofol (N/A, 04/16/2020); and Esophagogastroduodenoscopy (egd) with propofol (N/A, 04/16/2020).  Brief Summary:  Bryan [name] is a 64 y.o. male former smoker with COPD and chronic hypoxic/hypercapnic respiratory failure.      Subjective:  'he has been to the ER several times since I last saw him in June 2021.  He was also admitted to the hospital in August 2021 with shortness of breath and hypoxia from atypical chest pain, COPD exacerbation and diastolic CHF.  He continues to get winded with walking.  He also has persistent discomfort in his chest.  He had LHC in March 2021 that didn't show significant CAD.  He had repeat CT angiogram of chest in August 2021 that showed interstitial edema.    His weight has been steady.  He is using 1.5 liters oxygen.  Not having  cough, wheeze, or sputum.  Using albuterol tid because he was told to.  Labs from 05/19/20: EBC 8.3, Hb 11.3, PLT 400, Na 138, K 3.9, CO2 30, Creatinine 2.46, Ca 8.9.  Physical Exam:   Appearance - well kempt, wearing oxygen  ENMT - no sinus tenderness, no oral exudate, no LAN, Mallampati 3 airway, no stridor  Respiratory - decreased breath sounds bilaterally, no wheezing or rales, no tenderness on palpation of chest wall  CV - s1s2 regular rate and rhythm, no murmurs  Ext - no clubbing, no edema  Skin - no rashes  Psych - normal mood and affect   Pulmonary testing:   Spirometry 09/25/15 >> FEV1 0.5 (21%), FEV1% 45  ABG on RA 09/16/18 >> pH 7.40, PCO2 51.1, PO2 48  Chest Imaging:   CT angio chest 11/23/19 >> centrilobular emphysema  Cardiac Tests:   Echo 11/24/19 >> EF 55 to 60%, grade 1 DD, RVSP 60.5 mmHg  RHC/LHC 12/03/19 >> non obstructive CAD, PCW nl, LVEDP nl, WHO group 3 pulmonary HTN  Social History:  He  reports that he quit smoking about 5 years ago. His smoking use included cigarettes. He has a 20.00 pack-year smoking history. He has never used smokeless tobacco. He reports previous drug use. Drug: Marijuana. He reports that he does not drink alcohol.  Family History:  His family history includes Asthma in his mother; Cancer in his father.     Assessment/Plan:   COPD with emphysema. - continue trelegy - advised he only needs to use albuterol as needed - he will call when he is ready to get influenza vaccine  Chronic hypoxic/hypercapnic respiratory failure. - goal SpO2 > 90% - continue 1.5 liters 24/7  WHO group 3 pulmonary hypertension. - 2nd to COPD and hypoxia - continue supplemental oxygen - will arrange for repeat Echo  Atypical chest pain. - not clear what this is related to, but is his primary concern at this visit - will repeat chest xray and Echocardiogram - advise he can try prn acetaminophen or ibuprofen   Time Spent Involved in  Patient Care on Day of Examination:  34 minutes  Follow up:  Patient Instructions  Will schedule chest xray and echocardiogram  Can try using tylenol or ibuprofen as needed to help with chest discomfort  Follow up in 6 months   Medication List:   Allergies as of 06/22/2020   No Known Allergies     Medication List       Accurate as of June 23, 2063  1:55 PM. If you have any questions, ask your nurse or doctor.        acetaminophen 325 MG tablet Commonly known as: TYLENOL Take 2 tablets (650 mg total) by mouth every 6 (six) hours as needed for mild pain, fever or headache (or Fever >/= 101).   albuterol 108 (90 Base) MCG/ACT inhaler Commonly known as: VENTOLIN HFA Inhale 2 puffs into the lungs every 4 (four) hours as needed for wheezing or shortness of breath. Do not use with nebulizer   albuterol (2.5 MG/3ML) 0.083% nebulizer solution Commonly known as: PROVENTIL Take 3 mLs (2.5 mg total) by nebulization every 4 (four) hours as needed for wheezing or shortness of breath.   calcium carbonate 500 MG chewable tablet Commonly known as: TUMS - dosed in mg elemental calcium Chew 2 tablets (400 mg of elemental calcium total) by mouth 3 (three) times daily as needed for indigestion or heartburn.   gabapentin 300 MG capsule Commonly known as: NEURONTIN Take 1 capsule (300 mg total) by mouth 2 (two) times daily. Pt. Says he is taking twice daily. What changed:   how much to take  when to take this  additional instructions   guaiFENesin 600 MG 12 hr tablet Commonly known as: Mucinex Take 1 tablet (600 mg total) by mouth 2 (two) times daily.   lidocaine 5 % Commonly known as: Lidoderm Place 1 patch onto the skin daily. Remove & Discard patch within 12 hours or as directed by MD   linaclotide 72 MCG capsule Commonly known as: Linzess Take 1 capsule (72 mcg total) by mouth daily before breakfast. What changed:   when to take this  reasons to take this     magnesium oxide 400 MG tablet Commonly known as: MAG-OX Take 400 mg by mouth daily.   methocarbamol 500 MG tablet Commonly known as: ROBAXIN Take 1 tablet (500 mg total) by mouth every 8 (eight) hours as needed for muscle spasms.   metoprolol tartrate 25 MG tablet Commonly known as: LOPRESSOR Take 0.5 tablets (12.5 mg total) by mouth 2 (two) times daily.   nitroGLYCERIN 0.4 MG SL tablet Commonly known as: NITROSTAT Place 1 tablet (0.4 mg total) under the tongue every 5 (five) minutes as needed for chest pain.   OXYGEN Inhale 2.5 L into the lungs daily.   pantoprazole 40 MG tablet Commonly known as: PROTONIX Take 1 tablet (40 mg total) by mouth 2 (two) times daily before a meal. What changed: when to take this   sucralfate 1 GM/10ML suspension Commonly known as: Carafate Take 10 mLs (1 g total) by  mouth 4 (four) times daily -  with meals and at bedtime.   torsemide 20 MG tablet Commonly known as: Demadex Take 1 tablet (20 mg total) by mouth 2 (two) times daily. For Fluid/Heart   traMADol 50 MG tablet Commonly known as: ULTRAM Take 1 tablet (50 mg total) by mouth every 6 (six) hours as needed.   Trelegy Ellipta 100-62.5-25 MCG/INH Aepb Generic drug: Fluticasone-Umeclidin-Vilant Inhale 1 puff into the lungs daily.       Signature:  Chesley Mires, MD [place] Pager - [phone] 06/22/2020, 1:55 PM

## 2020-06-23 ENCOUNTER — Other Ambulatory Visit: Payer: Self-pay

## 2020-06-23 ENCOUNTER — Ambulatory Visit (HOSPITAL_COMMUNITY)
Admission: RE | Admit: 2020-06-23 | Discharge: 2020-06-23 | Disposition: A | Discharge status: Home / Self Care | Payer: Medicare Other | Source: Ambulatory Visit | Attending: Pulmonary Disease | Admitting: Pulmonary Disease

## 2020-06-23 DIAGNOSIS — R0789 Other chest pain: Secondary | ICD-10-CM | POA: Diagnosis present

## 2020-06-23 DIAGNOSIS — I272 Pulmonary hypertension, unspecified: Secondary | ICD-10-CM | POA: Insufficient documentation

## 2020-06-24 ENCOUNTER — Ambulatory Visit (HOSPITAL_COMMUNITY)
Admission: RE | Admit: 2020-06-24 | Discharge: 2020-06-24 | Disposition: A | Discharge status: Home / Self Care | Payer: Medicare Other | Source: Ambulatory Visit | Attending: Pulmonary Disease | Admitting: Pulmonary Disease

## 2020-06-24 ENCOUNTER — Other Ambulatory Visit: Payer: Self-pay

## 2020-06-24 DIAGNOSIS — I272 Pulmonary hypertension, unspecified: Secondary | ICD-10-CM | POA: Diagnosis not present

## 2020-06-24 LAB — ECHOCARDIOGRAM COMPLETE
Area-P 1/2: 2.42 cm2
S' Lateral: 2.45 cm

## 2020-06-24 NOTE — Progress Notes (Signed)
*  PRELIMINARY RESULTS* Echocardiogram 2D Echocardiogram has been performed.  Bryan [name] 06/24/2020, 1:49 PM

## 2020-06-25 ENCOUNTER — Encounter (HOSPITAL_COMMUNITY)
Admission: RE | Admit: 2020-06-25 | Discharge: 2020-06-25 | Disposition: A | Discharge status: Home / Self Care | Payer: Medicare Other | Source: Ambulatory Visit | Attending: Pulmonary Disease | Admitting: Pulmonary Disease

## 2020-06-25 ENCOUNTER — Other Ambulatory Visit: Payer: Self-pay

## 2020-06-25 ENCOUNTER — Encounter (HOSPITAL_COMMUNITY): Payer: Self-pay

## 2020-06-25 ENCOUNTER — Telehealth: Payer: Self-pay | Admitting: Pulmonary Disease

## 2020-06-25 VITALS — BP 90/60 | HR 80 | Ht 63.0 in | Wt 117.5 lb

## 2020-06-25 DIAGNOSIS — J432 Centrilobular emphysema: Secondary | ICD-10-CM

## 2020-06-25 NOTE — Progress Notes (Signed)
Pulmonary Individual Treatment Plan  Patient Details  Name: Bryan [name] MRN: [id number] Date of Birth: 11-16-1955 Referring Provider:     PULMONARY REHAB OTHER RESP ORIENTATION from 06/25/2020 in [place]  Referring Provider Dr. Halford Chessman      Initial Encounter Date:    [place] from 06/25/2020 in [place]  Date 06/25/20      Visit Diagnosis: Centrilobular emphysema ([place])  Patient's Home Medications on Admission:   Current Outpatient Medications:  .  acetaminophen (TYLENOL) 325 MG tablet, Take 2 tablets (650 mg total) by mouth every 6 (six) hours as needed for mild pain, fever or headache (or Fever >/= 101)., Disp: 12 tablet, Rfl: 0 .  albuterol (PROVENTIL) (2.5 MG/3ML) 0.083% nebulizer solution, Take 3 mLs (2.5 mg total) by nebulization every 4 (four) hours as needed for wheezing or shortness of breath., Disp: 75 mL, Rfl: 5 .  albuterol (VENTOLIN HFA) 108 (90 Base) MCG/ACT inhaler, Inhale 2 puffs into the lungs every 4 (four) hours as needed for wheezing or shortness of breath. Do not use with nebulizer, Disp: 18 g, Rfl: 2 .  calcium carbonate (TUMS - DOSED IN MG ELEMENTAL CALCIUM) 500 MG chewable tablet, Chew 2 tablets (400 mg of elemental calcium total) by mouth 3 (three) times daily as needed for indigestion or heartburn., Disp: 30 tablet, Rfl: 0 .  Fluticasone-Umeclidin-Vilant (TRELEGY ELLIPTA) 100-62.5-25 MCG/INH AEPB, Inhale 1 puff into the lungs daily., Disp: 28 each, Rfl: 5 .  gabapentin (NEURONTIN) 300 MG capsule, Take 1 capsule (300 mg total) by mouth 2 (two) times daily. Pt. Says he is taking twice daily. (Patient taking differently: Take 300 mg by mouth 3 (three) times daily. ), Disp: , Rfl:  .  guaiFENesin (MUCINEX) 600 MG 12 hr tablet, Take 1 tablet (600 mg total) by mouth 2 (two) times daily., Disp: 60 tablet, Rfl: 2 .  lidocaine (LIDODERM) 5 %, Place 1 patch onto the skin daily. Remove & Discard  patch within 12 hours or as directed by MD, Disp: 5 patch, Rfl: 0 .  linaclotide (LINZESS) 72 MCG capsule, Take 1 capsule (72 mcg total) by mouth daily before breakfast. (Patient taking differently: Take 72 mcg by mouth daily as needed (constipation.). ), Disp: 90 capsule, Rfl: 3 .  magnesium oxide (MAG-OX) 400 MG tablet, Take 400 mg by mouth daily., Disp: , Rfl:  .  methocarbamol (ROBAXIN) 500 MG tablet, Take 1 tablet (500 mg total) by mouth every 8 (eight) hours as needed for muscle spasms., Disp: 30 tablet, Rfl: 0 .  metoprolol tartrate (LOPRESSOR) 25 MG tablet, Take 0.5 tablets (12.5 mg total) by mouth 2 (two) times daily., Disp: 30 tablet, Rfl: 1 .  nitroGLYCERIN (NITROSTAT) 0.4 MG SL tablet, Place 1 tablet (0.4 mg total) under the tongue every 5 (five) minutes as needed for chest pain., Disp: 30 tablet, Rfl: 0 .  OXYGEN, Inhale 2.5 L into the lungs daily., Disp: , Rfl:  .  pantoprazole (PROTONIX) 40 MG tablet, Take 1 tablet (40 mg total) by mouth 2 (two) times daily before a meal. (Patient taking differently: Take 40 mg by mouth in the morning and at bedtime. ), Disp: 90 tablet, Rfl: 0 .  sucralfate (CARAFATE) 1 GM/10ML suspension, Take 10 mLs (1 g total) by mouth 4 (four) times daily -  with meals and at bedtime., Disp: 420 mL, Rfl: 3 .  torsemide (DEMADEX) 20 MG tablet, Take 1 tablet (20 mg total) by mouth 2 (  two) times daily. For Fluid/Heart, Disp: 60 tablet, Rfl: 2 .  traMADol (ULTRAM) 50 MG tablet, Take 1 tablet (50 mg total) by mouth every 6 (six) hours as needed., Disp: 10 tablet, Rfl: 0  Past Medical History: Past Medical History:  Diagnosis Date  . Acid reflux   . Arthritis   . Asthma   . Chronic pain    with leg and back pain (disc problem)  . COPD (chronic obstructive pulmonary disease) ([place])   . Diabetes mellitus without complication ([place])   . Headache    HX OF  . History of upper GI x-ray series    to follow showed large duodenal ulcer H pylori serologies were negative  .  Hypertension   . NSTEMI (non-ST elevated myocardial infarction) ([place]) 11/2019  . Pneumonia    07/17/17  . Pre-diabetes   . Stroke [facility])    TIA MINI STROKE  . Tubular adenoma     Tobacco Use: Social History   Tobacco Use  Smoking Status Former Smoker  . Packs/day: 0.50  . Years: 40.00  . Pack years: 20.00  . Types: Cigarettes  . Quit date: 11/11/2014  . Years since quitting: 5.6  Smokeless Tobacco Never Used    Labs: Recent Review Flowsheet Data    Labs for ITP Cardiac and Pulmonary Rehab Latest Ref Rng & Units 06/14/2019 08/03/2019 11/24/2019 12/03/2019 02/12/2020   Cholestrol 0 - 200 mg/dL - - 241(H) - -   LDLCALC 0 - 99 mg/dL - - 146(H) - -   HDL >40 mg/dL - - 86 - -   Trlycerides <150 mg/dL - - 45 - -   Hemoglobin A1c 4.8 - 5.6 % 7.0(H) 6.8(H) 6.7(H) - 6.7(H)   PHART 7.35 - 7.45 - - - - -   PCO2ART 32 - 48 mmHg - - - - -   HCO3 20.0 - 28.0 mmol/L - - - 39.8(H) -   TCO2 22 - 32 mmol/L - - - 42(H) -   O2SAT % - - - 64.0 -      Capillary Blood Glucose: Lab Results  Component Value Date   GLUCAP 169 (H) 04/22/2020   GLUCAP 135 (H) 04/22/2020   GLUCAP 112 (H) 04/22/2020   GLUCAP 141 (H) 04/21/2020   GLUCAP 158 (H) 04/21/2020     Pulmonary Assessment Scores:  Pulmonary Assessment Scores    Row Name 06/25/20 1012         ADL UCSD   ADL Phase Entry     SOB Score total 66       CAT Score   CAT Score 20       mMRC Score   mMRC Score 4           UCSD: Self-administered rating of dyspnea associated with activities of daily living (ADLs) 6-point scale (0 = "not at all" to 5 = "maximal or unable to do because of breathlessness")  Scoring Scores range from 0 to 120.  Minimally important difference is 5 units  CAT: CAT can identify the health impairment of COPD patients and is better correlated with disease progression.  CAT has a scoring range of zero to 40. The CAT score is classified into four groups of low (less than 10), medium (10 - 20), high (21-30) and  very high (31-40) based on the impact level of disease on health status. A CAT score over 10 suggests significant symptoms.  A worsening CAT score could be explained by an exacerbation, poor medication adherence,  poor inhaler technique, or progression of COPD or comorbid conditions.  CAT MCID is 2 points  mMRC: mMRC (Modified Medical Research Council) Dyspnea Scale is used to assess the degree of baseline functional disability in patients of respiratory disease due to dyspnea. No minimal important difference is established. A decrease in score of 1 point or greater is considered a positive change.   Pulmonary Function Assessment:   Exercise Target Goals: Exercise Program Goal: Individual exercise prescription set using results from initial 6 min walk test and THRR while considering  patient's activity barriers and safety.   Exercise Prescription Goal: Initial exercise prescription builds to 30-45 minutes a day of aerobic activity, 2-3 days per week.  Home exercise guidelines will be given to patient during program as part of exercise prescription that the participant will acknowledge.  Activity Barriers & Risk Stratification:   6 Minute Walk:  6 Minute Walk    Row Name 06/25/20 1012         6 Minute Walk   Phase Initial     Distance 400 feet     Walk Time 6 minutes     # of Rest Breaks 2     MPH 0.76     METS 1.59     RPE 5.55     Perceived Dyspnea  11     VO2 Peak 11     Symptoms Yes (comment)     Comments Pt took a seated rest break from 1.5 minutes after 200 ft due to SOB, leg fatigue, and back pain. He resumed walking for 200 more feet and reported 8/10 chest pain. The test was terminated 1 minute 15 seconds early due to this. The chest pain is brought on by exertion, and went away with rest.     Resting HR 80 bpm     Resting BP 90/60     Resting Oxygen Saturation  98 %     Exercise Oxygen Saturation  during 6 min walk 91 %     Max Ex. HR 99 bpm     Max Ex. BP 86/58      2 Minute Post BP 102/64       Interval HR   1 Minute HR 94     2 Minute HR 96     3 Minute HR 94     4 Minute HR 97     5 Minute HR 99     6 Minute HR 97     2 Minute Post HR 83     Interval Heart Rate? Yes       Interval Oxygen   Interval Oxygen? Yes     Baseline Oxygen Saturation % 98 %     1 Minute Oxygen Saturation % 97 %     1 Minute Liters of Oxygen 2 L     2 Minute Oxygen Saturation % 94 %     2 Minute Liters of Oxygen 2 L     3 Minute Oxygen Saturation % 91 %     3 Minute Liters of Oxygen 2 L     4 Minute Oxygen Saturation % 91 %     4 Minute Liters of Oxygen 2 L     5 Minute Oxygen Saturation % 91 %     5 Minute Liters of Oxygen 2 L     6 Minute Oxygen Saturation % 92 %     6 Minute Liters of Oxygen 2 L     2  Minute Post Oxygen Saturation % 93 %     2 Minute Post Liters of Oxygen 2 L            Oxygen Initial Assessment:  Oxygen Initial Assessment - 06/25/20 1008      Home Oxygen   Home Oxygen Device Home Concentrator;E-Tanks    Home Exercise Oxygen Prescription Continuous    Liters per minute 2    Home Resting Oxygen Prescription Continuous    Liters per minute 2    Compliance with Home Oxygen Use Yes      Initial 6 min Walk   Oxygen Used Continuous    Liters per minute 2      Program Oxygen Prescription   Program Oxygen Prescription Continuous    Liters per minute 2      Intervention   Short Term Goals To learn and understand importance of monitoring SPO2 with pulse oximeter and demonstrate accurate use of the pulse oximeter.;To learn and exhibit compliance with exercise, home and travel O2 prescription;To learn and understand importance of maintaining oxygen saturations>88%;To learn and demonstrate proper pursed lip breathing techniques or other breathing techniques.;To learn and demonstrate proper use of respiratory medications    Long  Term Goals Exhibits compliance with exercise, home and travel O2 prescription;Verbalizes importance of  monitoring SPO2 with pulse oximeter and return demonstration;Maintenance of O2 saturations>88%;Exhibits proper breathing techniques, such as pursed lip breathing or other method taught during program session;Compliance with respiratory medication           Oxygen Re-Evaluation:   Oxygen Discharge (Final Oxygen Re-Evaluation):   Initial Exercise Prescription:  Initial Exercise Prescription - 06/25/20 1000      Date of Initial Exercise RX and Referring Provider   Date 06/25/20    Referring Provider Dr. Halford Chessman    Expected Discharge Date 10/29/20      Oxygen   Oxygen Continuous    Liters 2      NuStep   Level 1    SPM 60    Minutes 39      Prescription Details   Frequency (times per week) 2    Duration Progress to 30 minutes of continuous aerobic without signs/symptoms of physical distress      Intensity   THRR 40-80% of Max Heartrate 62-125    Ratings of Perceived Exertion 11-13    Perceived Dyspnea 0-4      Resistance Training   Training Prescription Yes    Weight 1    Reps 10-15           Perform Capillary Blood Glucose checks as needed.  Exercise Prescription Changes:   Exercise Comments:   Exercise Goals and Review:   Exercise Goals    Row Name 06/25/20 1017             Exercise Goals   Increase Physical Activity Yes       Intervention Provide advice, education, support and counseling about physical activity/exercise needs.;Develop an individualized exercise prescription for aerobic and resistive training based on initial evaluation findings, risk stratification, comorbidities and participant's personal goals.       Expected Outcomes Short Term: Attend rehab on a regular basis to increase amount of physical activity.;Long Term: Add in home exercise to make exercise part of routine and to increase amount of physical activity.;Long Term: Exercising regularly at least 3-5 days a week.       Increase Strength and Stamina Yes       Intervention Provide  advice, education,  support and counseling about physical activity/exercise needs.;Develop an individualized exercise prescription for aerobic and resistive training based on initial evaluation findings, risk stratification, comorbidities and participant's personal goals.       Expected Outcomes Short Term: Increase workloads from initial exercise prescription for resistance, speed, and METs.;Short Term: Perform resistance training exercises routinely during rehab and add in resistance training at home;Long Term: Improve cardiorespiratory fitness, muscular endurance and strength as measured by increased METs and functional capacity (6MWT)       Able to understand and use rate of perceived exertion (RPE) scale Yes       Intervention Provide education and explanation on how to use RPE scale       Expected Outcomes Short Term: Able to use RPE daily in rehab to express subjective intensity level;Long Term:  Able to use RPE to guide intensity level when exercising independently       Able to understand and use Dyspnea scale Yes       Intervention Provide education and explanation on how to use Dyspnea scale       Expected Outcomes Short Term: Able to use Dyspnea scale daily in rehab to express subjective sense of shortness of breath during exertion;Long Term: Able to use Dyspnea scale to guide intensity level when exercising independently       Knowledge and understanding of Target Heart Rate Range (THRR) Yes       Intervention Provide education and explanation of THRR including how the numbers were predicted and where they are located for reference       Expected Outcomes Short Term: Able to state/look up THRR;Long Term: Able to use THRR to govern intensity when exercising independently;Short Term: Able to use daily as guideline for intensity in rehab       Understanding of Exercise Prescription Yes       Intervention Provide education, explanation, and written materials on patient's individual exercise  prescription       Expected Outcomes Short Term: Able to explain program exercise prescription;Long Term: Able to explain home exercise prescription to exercise independently              Exercise Goals Re-Evaluation :   Discharge Exercise Prescription (Final Exercise Prescription Changes):   Nutrition:  Target Goals: Understanding of nutrition guidelines, daily intake of sodium <1553m, cholesterol <2095m calories 30% from fat and 7% or less from saturated fats, daily to have 5 or more servings of fruits and vegetables.  Biometrics:  Pre Biometrics - 06/25/20 1017      Pre Biometrics   Height _0  (1.6 m)    Weight 53.3 kg    Waist Circumference 32.5 inches    Hip Circumference 34.5 inches    Waist to Hip Ratio 0.94 %    BMI (Calculated) 20.82    Triceps Skinfold 9 mm    % Body Fat 19.8 %    Grip Strength 28.3 kg    Flexibility 0 in    Single Leg Stand 60 seconds            Nutrition Therapy Plan and Nutrition Goals:  Nutrition Therapy & Goals - 06/25/20 0933      Personal Nutrition Goals   Comments Patient scored 30 on his medficts diet assessemt scoreing highest in eggs. He says he eats 2 eggs/day. Reviewed score with patient and handout given and reviewed regarding making healthier choices. He says he is not interested in changing his diet. He is being monitored for pre-diabetes. Fasting  glucose this am 96 mg/dl. He says he watches his sugar intake. Will continue to monitor.      Intervention Plan   Intervention Nutrition handout(s) given to patient.           Nutrition Assessments:  Nutrition Assessments - 06/25/20 0937      MEDFICTS Scores   Pre Score 30           Nutrition Goals Re-Evaluation:   Nutrition Goals Discharge (Final Nutrition Goals Re-Evaluation):   Psychosocial: Target Goals: Acknowledge presence or absence of significant depression and/or stress, maximize coping skills, provide positive support system. Participant is able to  verbalize types and ability to use techniques and skills needed for reducing stress and depression.  Initial Review & Psychosocial Screening:  Initial Psych Review & Screening - 06/25/20 0930      Initial Review   Current issues with None Identified      Family Dynamics   Good Support System? Yes    Comments Patient lives with his adult son. He says he has very good support from his children and 2 sisters and friends. He has a positive outlook regarding his future and is looking forward to participating in the program. He denies any depression or anxiety.      Barriers   Psychosocial barriers to participate in program There are no identifiable barriers or psychosocial needs.      Screening Interventions   Interventions Encouraged to exercise;Provide feedback about the scores to participant           Quality of Life Scores:  Quality of Life - 06/25/20 1018      Quality of Life   Select Quality of Life      Quality of Life Scores   Health/Function Pre 12.66 %    Socioeconomic Pre 16.58 %    Psych/Spiritual Pre 26.21 %    Family Pre 26.75 %    GLOBAL Pre 17.95 %          Scores of 19 and below usually indicate a poorer quality of life in these areas.  A difference of  2-3 points is a clinically meaningful difference.  A difference of 2-3 points in the total score of the Quality of Life Index has been associated with significant improvement in overall quality of life, self-image, physical symptoms, and general health in studies assessing change in quality of life.   PHQ-9: Recent Review Flowsheet Data    Depression screen [facility] 2/9 06/25/2020 03/10/2020 02/24/2020 02/17/2020 01/15/2020   Decreased Interest 0 0 0 0 0   Down, Depressed, Hopeless 0 0 0 0 0   PHQ - 2 Score 0 0 0 0 0   Altered sleeping 2 - - - -   Tired, decreased energy 2 - - - -   Change in appetite 0 - - - -   Feeling bad or failure about yourself  0 - - - -   Trouble concentrating 0 - - - -   Moving slowly or  fidgety/restless 0 - - - -   Suicidal thoughts 0 - - - -   PHQ-9 Score 4 - - - -   Difficult doing work/chores Somewhat difficult - - - -     Interpretation of Total Score  Total Score Depression Severity:  1-4 = Minimal depression, 5-9 = Mild depression, 10-14 = Moderate depression, 15-19 = Moderately severe depression, 20-27 = Severe depression   Psychosocial Evaluation and Intervention:  Psychosocial Evaluation - 06/25/20 0931  Psychosocial Evaluation & Interventions   Interventions Stress management education;Relaxation education;Encouraged to exercise with the program and follow exercise prescription    Comments Patient's inital overall OQL score was 17.95% scoring lowest in health 12.66% and socioeconomic 16.58% and his PHQ-9 score is 4 scoring high in having little energy and not being able to stay asleep due to nocturia. No psychosocial issues identifed at his orientation visit. Will continue to monitor.    Expected Outcomes Patient will have no psychosocial issues identified at discharge.           Psychosocial Re-Evaluation:   Psychosocial Discharge (Final Psychosocial Re-Evaluation):    Education: Education Goals: Education classes will be provided on a weekly basis, covering required topics. Participant will state understanding/return demonstration of topics presented.  Learning Barriers/Preferences:  Learning Barriers/Preferences - 06/25/20 0937      Learning Barriers/Preferences   Learning Barriers None    Learning Preferences Audio           Education Topics: How Lungs Work and Diseases: - Discuss the anatomy of the lungs and diseases that can affect the lungs, such as COPD.   Exercise: -Discuss the importance of exercise, FITT principles of exercise, normal and abnormal responses to exercise, and how to exercise safely.   Environmental Irritants: -Discuss types of environmental irritants and how to limit exposure to environmental  irritants.   Meds/Inhalers and oxygen: - Discuss respiratory medications, definition of an inhaler and oxygen, and the proper way to use an inhaler and oxygen.   Energy Saving Techniques: - Discuss methods to conserve energy and decrease shortness of breath when performing activities of daily living.    Bronchial Hygiene / Breathing Techniques: - Discuss breathing mechanics, pursed-lip breathing technique,  proper posture, effective ways to clear airways, and other functional breathing techniques   Cleaning Equipment: - Provides group verbal and written instruction about the health risks of elevated stress, cause of high stress, and healthy ways to reduce stress.   Nutrition I: Fats: - Discuss the types of cholesterol, what cholesterol does to the body, and how cholesterol levels can be controlled.   Nutrition II: Labels: -Discuss the different components of food labels and how to read food labels.   Respiratory Infections: - Discuss the signs and symptoms of respiratory infections, ways to prevent respiratory infections, and the importance of seeking medical treatment when having a respiratory infection.   Stress I: Signs and Symptoms: - Discuss the causes of stress, how stress may lead to anxiety and depression, and ways to limit stress.   Stress II: Relaxation: -Discuss relaxation techniques to limit stress.   Oxygen for Home/Travel: - Discuss how to prepare for travel when on oxygen and proper ways to transport and store oxygen to ensure safety.   Knowledge Questionnaire Score:  Knowledge Questionnaire Score - 06/25/20 0937      Knowledge Questionnaire Score   Pre Score 12/18           Core Components/Risk Factors/Patient Goals at Admission:  Personal Goals and Risk Factors at Admission - 06/25/20 0938      Core Components/Risk Factors/Patient Goals on Admission    Weight Management Weight Maintenance    Improve shortness of breath with ADL's Yes     Intervention Provide education, individualized exercise plan and daily activity instruction to help decrease symptoms of SOB with activities of daily living.    Expected Outcomes Short Term: Improve cardiorespiratory fitness to achieve a reduction of symptoms when performing ADLs;Long Term: Be able to  perform more ADLs without symptoms or delay the onset of symptoms    Personal Goal Other Yes    Personal Goal Get stronger; breathe better; be able to do ADL's with less SOB; get off of O2 eventually.    Intervention Patient will attend PR 2 days/week and supplement with exercise at home 3 days/week.    Expected Outcomes Patient will meet both personal and program goals.           Core Components/Risk Factors/Patient Goals Review:    Core Components/Risk Factors/Patient Goals at Discharge (Final Review):    ITP Comments:   Comments: Patient arrived for 1st visit/orientation/education at 0800. Patient was referred to CR by Dr. Halford Chessman due to Centrilobular Emphysema (J43.2). During orientation advised patient on arrival and appointment times what to wear, what to do before, during and after exercise. Reviewed attendance and class policy.  Pt was advised to come to class 15 minutes before class starts.  Discussed RPE/Dpysnea scales. Patient participated in warm up stretches. Patient attempted 6 minute walk test. He had to stop 2 times during walk test due to fatigue and back pain. The test was ended after 5 minutes due to patient complaining of chest pain 8/10. All pain subsided after rest 0/10. Patient is scheduled to be evaluated by cardiology 07/06/20 for chest pain with activity. We will wait for him to be evaluated by cardiology before we proceed with pulmonary rehab.  Patient finished visit at [place].

## 2020-06-25 NOTE — Progress Notes (Signed)
Cardiac/Pulmonary Rehab Medication Review by a Pharmacist  Does the patient  feel that his/her medications are working for him/her?  Yes besides gabapentin- patient states still has burning sensation on current dose.  Has the patient been experiencing any side effects to the medications prescribed?  no  Does the patient measure his/her own blood pressure or blood glucose at home?  yes  Does the patient have any problems obtaining medications due to transportation or finances?   no  Understanding of regimen: good Understanding of indications: good Potential of compliance: good  Questions asked to Determine Patient Understanding of Medication Regimen:  1. What is the name of the medication?  2. What is the medication used for?  3. When should it be taken?  4. How much should be taken?  5. How will you take it?  6. What side effects should you report?  Understanding Defined as: Excellent: All questions above are correct Good: Questions 1-4 are correct Fair: Questions 1-2 are correct  Poor: 1 or none of the above questions are correct   Pharmacist comments: Overall, patient has great understanding of his regimen and high compliance.  States gabapentin still not working and experiences burning sensation when trying to sleep at times.  Recommended talking to his MD to discuss dose increase or change in therapy.    Ramond Craver 06/25/2020 8:37 AM

## 2020-06-25 NOTE — Telephone Encounter (Signed)
Noted.  Bryan Mires, MD [place] Pager - [phone] 06/25/2020, 2:31 PM

## 2020-06-25 NOTE — Telephone Encounter (Signed)
Spoke with Bryan [name] with pulmonary rehab  06/25/20- first visit with rehab pt walked 200 ft and stopped due to leg fatigue, then walked another 200 ft and had to stop and rest and at that point he c/o CP 8 out of [place] states pt has pending appt with cards for eval on 07/06/20  They are going to hold off on pulmonary rehab until he is done with cards w/u due to the CP  Will forward to [name] to make him aware

## 2020-06-26 ENCOUNTER — Other Ambulatory Visit (HOSPITAL_COMMUNITY): Payer: Medicare Other

## 2020-06-26 ENCOUNTER — Telehealth (HOSPITAL_COMMUNITY): Payer: Self-pay

## 2020-06-26 ENCOUNTER — Other Ambulatory Visit: Payer: Self-pay | Admitting: *Deleted

## 2020-06-26 NOTE — Patient Outreach (Signed)
[facility]) Care Management  06/26/2020  YACOB WILKERSON [birth year] [phone]   THNoutreach to complex care patient  Mr Weekes was initially referred to [facility] on 10/07/19 as an [facility] UM referral for assistance with finding an affordable housing.[facility] SW services has been provided. Mr Blais needs to follow up on the [facility] SW resources provided to him. He continues to state he was not preferring apartment living, has completed the forms and returned them to the local housing department but has not routinely followed up.  Insurance:[employer] medicare  Patient is able to verify HIPAA ([place] and Accountability Act) identifiers Reviewed and addressed the purpose of the follow up call with the patient  Consent: [facility]) RN CM reviewed [facility] services with patient. Patient gave verbal consent for services.  Last ED visit on 05/19/20 for atypical chest pain ED 04/28/20 for non specific chest pain 04/19/20 for respiratory failure with admission  04/08/20 with chronic gastritis  Cardiac/pulmonary rehab/atypical chest pain Mr Kenner completed a session of cardiac rehab but was noted with atypical chest pain 8/10 during his walk test on 06/25/20.  His therapist and [name] notes for him not to return until he is seen by cardiology He is scheduled to be seen by Dr Johney Frame on 07/06/20. [facility] RN CM discussed with him the outreach with Renee on 06/18/20 for care coordination of an extra cardiology appointment  He reports he has experienced some of the atypical pain today but resolved with rest  He has nitroglycerin but notes that each time he takes it he has pain on the right side of his chest which he reports frightens him  He reports a good follow up visit with a MD in Dr Legrand Rams office but can not recall the MD's name Attempt with pt to outreach to the office but the pcp office is closed   [facility] RN CM will follow up with Mr Tubby within  in the next 14-21 business days  Pt encouraged to return a call to [facility] RN CM prn Routed note to MD   [place]. [name], RN, BSN, [place] Coordinator Office number [phone] [facility] [place] number [phone] Fax number [phone]

## 2020-06-29 ENCOUNTER — Telehealth: Payer: Self-pay

## 2020-06-29 NOTE — Telephone Encounter (Signed)
Received a call from [facility]). Please call in reference to MRI scheduled with Zacarias Pontes on 07/03/20. [phone]. Call between the hours of 10:30-2:00 PM.

## 2020-06-30 ENCOUNTER — Encounter (HOSPITAL_COMMUNITY): Payer: Medicare Other

## 2020-06-30 ENCOUNTER — Encounter (HOSPITAL_COMMUNITY): Payer: Self-pay

## 2020-06-30 NOTE — Telephone Encounter (Signed)
Message received from [employer]

## 2020-07-01 ENCOUNTER — Telehealth: Payer: Self-pay | Admitting: *Deleted

## 2020-07-01 ENCOUNTER — Telehealth: Payer: Self-pay | Admitting: Pulmonary Disease

## 2020-07-01 DIAGNOSIS — J449 Chronic obstructive pulmonary disease, unspecified: Secondary | ICD-10-CM

## 2020-07-01 DIAGNOSIS — J9611 Chronic respiratory failure with hypoxia: Secondary | ICD-10-CM

## 2020-07-01 DIAGNOSIS — I272 Pulmonary hypertension, unspecified: Secondary | ICD-10-CM

## 2020-07-01 NOTE — Telephone Encounter (Signed)
CXR normal.  Echo 06/24/20 >> EF 55 to 60%, mild LVH, interventricular septal flattening, severe RV systolic dysfx, RVSP 791.5 mmHg, mod RA dilation, mod/severe TR   Results d/w with Mr. Dittmar.    Will arrange for overnight oximetry on 1.5 liters.  Depending on results, he might need additional assessment for sleep apnea.  He had heart cath in March with Dr. Virgina Jock.  He reports he has a follow up with cardiology later this month, and I have asked to make sure cardiology reviews his most recent echocardiogram.

## 2020-07-01 NOTE — Telephone Encounter (Signed)
Received message from [place] at [facility] regarding MRA. "spoke to Dr. Earleen Newport in radiology. He recommends we use contrast. Order will need to change to MR Angio Abdomen W/WO contrast as long as it is ok with Dr. Gala Romney. Any questions he may have for Dr. Earleen Newport he can be reached at [phone]. our contrast is safe for patient's with low gfr"  Please advise Dr. Gala Romney thanks!

## 2020-07-02 ENCOUNTER — Encounter (HOSPITAL_COMMUNITY): Payer: Medicare Other

## 2020-07-02 NOTE — Telephone Encounter (Signed)
OK 

## 2020-07-02 NOTE — Telephone Encounter (Signed)
Order has been changed. I have also informed Tim.

## 2020-07-02 NOTE — Addendum Note (Signed)
Addended by: Cheron Every on: 07/02/2020 09:12 AM   Modules accepted: Orders

## 2020-07-03 ENCOUNTER — Ambulatory Visit (HOSPITAL_COMMUNITY)
Admission: RE | Admit: 2020-07-03 | Discharge: 2020-07-03 | Disposition: A | Discharge status: Home / Self Care | Payer: Medicare Other | Source: Ambulatory Visit | Attending: Internal Medicine | Admitting: Internal Medicine

## 2020-07-03 ENCOUNTER — Encounter (HOSPITAL_COMMUNITY): Payer: Self-pay

## 2020-07-03 ENCOUNTER — Other Ambulatory Visit: Payer: Self-pay

## 2020-07-03 DIAGNOSIS — R1084 Generalized abdominal pain: Secondary | ICD-10-CM

## 2020-07-05 NOTE — Progress Notes (Signed)
Cardiology Office Note:    Date:  07/06/2020   ID:  [name], DOB December 16, 1955, MRN [id number]  PCP:  Wynonia Hazard, NP  [facility] Cardiologist:  No primary care provider on file.  [facility] Electrophysiologist:  None   Referring MD: Rosita Fire, MD    History of Present Illness:    CHARLS CUSTER is a 64 y.o. male with a hx of HTN, COPD on oxygen, pulmonary HTN (WHO III), peptic ulcer disease, DMII, prior tobacco use (20 pack year history) and non-obstructive CAD who is referred by Dr. Legrand Rams for evaluation of chest pain.  Patient has reportedly had intermittent chest pain for the past 7 months and has been hospitalized about 4-5 times with most recent ED visit on 05/19/20. There troponin was normal; d-dimer mildly elevated at 0.81, BNP 344. Recent cath 11/2019 with non-obstructive CAD. Was discharged from ED and followed up with GI and pulm.   He continues to have SOB on exertion, chronic abdominal pain, and persistent chest discomfort.  Per GI, recommended CTA, however, patient was unable to complete the exam due to anxiety with laying in the CT scanner. Had recent TTE on 10/13 which showed severely enlarged and hypokinetic RV, moderate-to-severe TR, PASP 171mHg.   Today, the patient states that anytime he gets up and walks he develops severe right sided chest pain. Worse with deep breaths or bearing down to go to the bathroom. Eases up when sitting down to rest. Each episode lasts about 15-20 min. Pain is sharp in nature. Radiates to the abdomen, but not down the arm or into the jaw. Also notes some dizziness when going from seated to standing position. Has occasional LE edema. Has to sleep propped up on 2 pillows due to abdominal discomfort that develops when he lays down. Has taken nitro for the chest pain, but states that it makes him have left sided chest pain and therefore he stopped taking it.  Of note, the patient has had similar chest pain ongoing for the past  several months and has had extensive work-up including TTE as detailed above, cath with 30% LM but no obstructive CAD, CTA chest which was negative for PE but showed coronary calcification, and is planned for a CTA of the abdomen and pelvis.   Past Medical History:  Diagnosis Date  . Abdominal pain   . Acid reflux   . Arthritis   . Asthma   . Chest pain   . Chronic pain    with leg and back pain (disc problem)  . COPD (chronic obstructive pulmonary disease) [place]   . Diabetes mellitus without complication ([place]   . Epigastric pain   . Headache    HX OF  . History of upper GI x-ray series    to follow showed large duodenal ulcer H pylori serologies were negative  . Hypertension   . NSTEMI (non-ST elevated myocardial infarction) ([place] 11/2019  . Pneumonia    07/17/17  . Pre-diabetes   . Stroke ([facility]    TIA MINI STROKE  . Tubular adenoma     Past Surgical History:  Procedure Laterality Date  . BACK SURGERY  7/05; 5/09    Dr.Hirsch,3 lumbar  X3  . BACK SURGERY    . COLONOSCOPY  Feb 2012   Dr. RGala Romney normal rectum, pedunculate polyp removed but not recovered  . COLONOSCOPY WITH ESOPHAGOGASTRODUODENOSCOPY (EGD) N/A 11/18/2013   Dr.Rourk- tcs= normal rectum, multipe polyps about the ileocecal valve and distal transverse colon  o/w the remainder of the colonic mucosa appeared normal bx= tubular adenoma. EGD= normal esophagus, stomach with scattered erosions mottling, friablility, no ulcer or infiltrating process patent pylorus bx= chronic inflammation. next TCS 11/2016  . COLONOSCOPY WITH PROPOFOL N/A 05/25/2017   Dr. Gala Romney: sigmoid diverticulosis, one 4 mm hyerplastic rectal polyp, ascending colonic AVMs surveillance 2023  . COLONOSCOPY WITH PROPOFOL N/A 04/16/2020   Procedure: COLONOSCOPY WITH PROPOFOL;  Surgeon: Daneil Dolin, MD;  [facility];  Service: Endoscopy;  Laterality: N/A;  . ESOPHAGOGASTRODUODENOSCOPY  1/06   Dr. Rourk:Distal esophageal erosions,U-shaped  stomach,marked erosions and edema of the bulb without discrete ulcer disease.   . ESOPHAGOGASTRODUODENOSCOPY (EGD) WITH PROPOFOL N/A 05/25/2017   Dr. Gala Romney: reflux esophagitis s/p empiric dilation, normal stomach and duodenum  . ESOPHAGOGASTRODUODENOSCOPY (EGD) WITH PROPOFOL Left 11/30/2019   LA Grade D esophagitis, small hiatal hernia, one non-bleeding cratered gastric ulcer in prepyloric region of stomach, many non-bleeding cratered duodenal ulcers without stigmata of bleeding, mild luminal narrowing at apex of duodenal bulb but traversed easily. H.pylori serology negative.  . ESOPHAGOGASTRODUODENOSCOPY (EGD) WITH PROPOFOL N/A 04/16/2020   Procedure: ESOPHAGOGASTRODUODENOSCOPY (EGD) WITH PROPOFOL;  Surgeon: Daneil Dolin, MD;  [facility];  Service: Endoscopy;  Laterality: N/A;  . Venia Minks DILATION N/A 05/25/2017   Procedure: Venia Minks DILATION;  Surgeon: Daneil Dolin, MD;  [facility];  Service: Endoscopy;  Laterality: N/A;  . NECK SURGERY  10/2007   PLATE IN NECK  . POLYPECTOMY  05/25/2017   Procedure: POLYPECTOMY;  Surgeon: Daneil Dolin, MD;  [facility];  Service: Endoscopy;;  rectal  . RIGHT/LEFT HEART CATH AND CORONARY ANGIOGRAPHY N/A 12/03/2019   Procedure: RIGHT/LEFT HEART CATH AND CORONARY ANGIOGRAPHY;  Surgeon: Nigel Mormon, MD;  Location: [place] [facility];  Service: Cardiovascular;  Laterality: N/A;  . TRANSURETHRAL RESECTION OF PROSTATE N/A 08/17/2017   Procedure: TRANSURETHRAL RESECTION OF THE PROSTATE (TURP);  Surgeon: Irine Seal, MD;  Location: [facility];  Service: Urology;  Laterality: N/A;    Current Medications: Current Meds  Medication Sig  . acetaminophen (TYLENOL) 325 MG tablet Take 2 tablets (650 mg total) by mouth every 6 (six) hours as needed for mild pain, fever or headache (or Fever >/= 101).  Marland Kitchen albuterol (PROVENTIL) (2.5 MG/3ML) 0.083% nebulizer solution Take 3 mLs (2.5 mg total) by nebulization every 4 (four) hours as  needed for wheezing or shortness of breath.  Marland Kitchen albuterol (VENTOLIN HFA) 108 (90 Base) MCG/ACT inhaler Inhale 2 puffs into the lungs every 4 (four) hours as needed for wheezing or shortness of breath. Do not use with nebulizer  . calcium carbonate (TUMS - DOSED IN MG ELEMENTAL CALCIUM) 500 MG chewable tablet Chew 2 tablets (400 mg of elemental calcium total) by mouth 3 (three) times daily as needed for indigestion or heartburn.  . Fluticasone-Umeclidin-Vilant (TRELEGY ELLIPTA) 100-62.5-25 MCG/INH AEPB Inhale 1 puff into the lungs daily.  Marland Kitchen gabapentin (NEURONTIN) 300 MG capsule Take 1 capsule (300 mg total) by mouth 2 (two) times daily. Pt. Says he is taking twice daily.  Marland Kitchen guaiFENesin (MUCINEX) 600 MG 12 hr tablet Take 1 tablet (600 mg total) by mouth 2 (two) times daily.  Marland Kitchen linaclotide (LINZESS) 72 MCG capsule Take 1 capsule (72 mcg total) by mouth daily before breakfast.  . magnesium oxide (MAG-OX) 400 MG tablet Take 400 mg by mouth daily.  . methocarbamol (ROBAXIN) 500 MG tablet Take 1 tablet (500 mg total) by mouth every 8 (eight)  hours as needed for muscle spasms.  . metoprolol tartrate (LOPRESSOR) 25 MG tablet Take 0.5 tablets (12.5 mg total) by mouth 2 (two) times daily.  . nitroGLYCERIN (NITROSTAT) 0.4 MG SL tablet Place 1 tablet (0.4 mg total) under the tongue every 5 (five) minutes as needed for chest pain.  . OXYGEN Inhale 2.5 L into the lungs daily.  . pantoprazole (PROTONIX) 40 MG tablet Take 1 tablet (40 mg total) by mouth 2 (two) times daily before a meal.  . sucralfate (CARAFATE) 1 GM/10ML suspension Take 10 mLs (1 g total) by mouth 4 (four) times daily -  with meals and at bedtime.  . torsemide (DEMADEX) 20 MG tablet Take 1 tablet (20 mg total) by mouth 2 (two) times daily. For Fluid/Heart     Allergies:   Patient has no known allergies.   Social History   Socioeconomic History  . Marital status: Single    Spouse name: Not on file  . Number of children: 2  . Years of  education: 70  . Highest education level: High school graduate  Occupational History  . Occupation: disabled    Fish farm manager: UNEMPLOYED  Tobacco Use  . Smoking status: Former Smoker    Packs/day: 0.50    Years: 40.00    Pack years: 20.00    Types: Cigarettes    Quit date: 11/11/2014    Years since quitting: 5.6  . Smokeless tobacco: Never Used  Vaping Use  . Vaping Use: Never used  Substance and Sexual Activity  . Alcohol use: No    Alcohol/week: 0.0 standard drinks  . Drug use: Not Currently    Types: Marijuana    Comment: occas; denied 01/28/20  . Sexual activity: Not Currently    Birth control/protection: None  Other Topics Concern  . Not on file  Social History Narrative   Lives with girlfriend and son, is on disability for history of back injuries.   Social Determinants of Health   Financial Resource Strain: Medium Risk  . Difficulty of Paying Living Expenses: Somewhat hard  Food Insecurity: No Food Insecurity  . Worried About Charity fundraiser in the Last Year: Never true  . Ran Out of Food in the Last Year: Never true  Transportation Needs: No Transportation Needs  . Lack of Transportation (Medical): No  . Lack of Transportation (Non-Medical): No  Physical Activity: Inactive  . Days of Exercise per Week: 0 days  . Minutes of Exercise per Session: 0 min  Stress: No Stress Concern Present  . Feeling of Stress : Not at all  Social Connections: Socially Isolated  . Frequency of Communication with Friends and Family: More than three times a week  . Frequency of Social Gatherings with Friends and Family: More than three times a week  . Attends Religious Services: Never  . Active Member of Clubs or Organizations: No  . Attends Archivist Meetings: Never  . Marital Status: Never married     Family History: The patient's family history includes Asthma in his mother; Cancer in his father. There is no history of Colon cancer.  ROS:   Please see the history  of present illness.    Review of Systems  Constitutional: Negative for chills and fever.  HENT: Negative for congestion.   Eyes: Negative for blurred vision.  Respiratory: Positive for shortness of breath. Negative for cough.   Cardiovascular: Positive for chest pain and leg swelling. Negative for orthopnea, claudication and PND.  Gastrointestinal: Positive for abdominal  pain and heartburn. Negative for blood in stool and melena.  Genitourinary: Negative for dysuria.  Skin: Negative for rash.  Neurological: Positive for dizziness. Negative for focal weakness.  Psychiatric/Behavioral: Negative for substance abuse.    EKGs/Labs/Other Studies Reviewed:    The following studies were reviewed today: Cath 12/03/19: LM: Focal distal LM 30% stenosis. LAD: Minimal luminal irregularities. LCx: Normal RCA: Minimal luminal irregularities.  Mean PAP 40 mmHg Normal PCW, LVEDP  Impression: Mild nonobstructive coronary artery disease Pulmonary hypertension, likely WHO Grp III  Right Heart Pressures RA: 8 mmHg RV: 66/5 mmHg PA: 64/26 mmHg. Mean PA 40 mmHg PCW: 13 mmHg     TTE 06/24/20: IMPRESSIONS  1. Left ventricular ejection fraction, by estimation, is 55 to 60%. The  left ventricle has normal function. The left ventricle has no regional  wall motion abnormalities. There is mild left ventricular hypertrophy.  Left ventricular diastolic parameters  are indeterminate. There is the interventricular septum is flattened in  systole and diastole, consistent with right ventricular pressure and  volume overload.  2. Right ventricular systolic function is severely reduced. The right  ventricular size is severely enlarged. There is severely elevated  pulmonary artery systolic pressure. The estimated right ventricular  systolic pressure is 638.7 mmHg.  3. Right atrial size was moderately dilated.  4. The mitral valve is grossly normal. Trivial mitral valve  regurgitation.  5.  Tricuspid valve regurgitation is moderate to severe.  6. The aortic valve is tricuspid. Aortic valve regurgitation is not  visualized.  7. The inferior vena cava is dilated in size with >50% respiratory  variability, suggesting right atrial pressure of 8 mmHg.   TTE 11/24/19: IMPRESSIONS  1. Left ventricular ejection fraction, by estimation, is 55 to 60%. The  left ventricle has normal function. The left ventricle has no regional  wall motion abnormalities. Left ventricular diastolic parameters are  consistent with Grade I diastolic  dysfunction (impaired relaxation). There is the interventricular septum is  flattened in systole and diastole, consistent with right ventricular  pressure and volume overload.  2. Right ventricular systolic function is low normal. The right  ventricular size is mildly enlarged. mildly increased right ventricular  wall thickness. There is moderately elevated pulmonary artery systolic  pressure. The estimated right ventricular  systolic pressure is 56.4 mmHg.  3. Right atrial size was mild to moderately dilated.  4. The mitral valve is grossly normal. Trivial mitral valve  regurgitation.  5. Tricuspid valve regurgitation is mild to moderate.  6. The aortic valve is tricuspid. Aortic valve regurgitation is not  visualized.  7. The inferior vena cava is normal in size with greater than 50%  respiratory variability, suggesting right atrial pressure of 3 mmHg.   Comparison(s): Changes from prior study are noted. 06/13/2019 - LVEF  60-65%, severe pulmonary hypertension - RVSP 70 mmHg.  CTA chest 11/23/19: FINDINGS: Cardiovascular: Satisfactory opacification of the pulmonary arteries to the segmental level. No evidence of pulmonary embolism. Normal heart size. No pericardial effusion. Moderate to marked severity coronary artery calcification is seen. This is present on the prior study.  Mediastinum/Nodes: No enlarged mediastinal, hilar, or axillary  lymph nodes. Thyroid gland, trachea, and esophagus demonstrate no significant findings.  Lungs/Pleura: A trace amount of atelectasis is seen within the inferior aspect of the left upper lobe.  There is no evidence of acute infiltrate, pleural effusion or pneumothorax.  Upper Abdomen: No acute abnormality.  Musculoskeletal: A metallic density fusion plate and screws are seen along the anterior  aspect of the lower cervical spine.  Review of the MIP images confirms the above findings.  IMPRESSION: 1. No CT evidence of pulmonary embolism or acute cardiopulmonary disease. 2. Moderate to marked severity coronary artery calcification. 3. Prior cervical spine fusion.  Carotid ultrasound 07/2017: Final Interpretation:  Right Carotid: There is evidence in the right ICA of a 1-39% stenosis.   Left Carotid: There is evidence in the left ICA of a 1-39% stenosis.   Vertebrals: Both vertebral arteries were patent with antegrade flow.    EKG:  ECG 05/20/20: NSR with RAD  Recent Labs: 04/21/2020: ALT 15; Magnesium 1.6 05/19/2020: B Natriuretic Peptide 344.0; BUN 46; Creatinine, Ser 2.46; Hemoglobin 11.3; Platelets 400; Potassium 3.9; Sodium 138  Recent Lipid Panel    Component Value Date/Time   CHOL 241 (H) 11/24/2019 1316   TRIG 45 11/24/2019 1316   HDL 86 11/24/2019 1316   CHOLHDL 2.8 11/24/2019 1316   VLDL 9 11/24/2019 1316   LDLCALC 146 (H) 11/24/2019 1316     Physical Exam:    VS:  BP 108/62   Pulse 81   Ht 5' 3" (1.6 m)   Wt 118 lb 9.6 oz (53.8 kg)   SpO2 (!) 89%   BMI 21.01 kg/m     Wt Readings from Last 3 Encounters:  07/06/20 118 lb 9.6 oz (53.8 kg)  06/25/20 117 lb 8.1 oz (53.3 kg)  06/22/20 120 lb 12.8 oz (54.8 kg)     GEN:  Comfortable, speaking in full sentences, home O2 in place (1.5L [place]) HEENT: Normal NECK: No JVD; No carotid bruits CARDIAC: RRR, no murmurs, rubs, gallops RESPIRATORY:  Diminished breath sound, but clear ABDOMEN: Soft, non-tender,  non-distended MUSCULOSKELETAL:  No edema; No deformity  SKIN: Warm and dry NEUROLOGIC:  Alert and oriented x 3 PSYCHIATRIC:  Normal affect   ASSESSMENT:    1. Coronary artery calcification seen on CAT scan   2. Mixed hyperlipidemia   3. Chest pain of uncertain etiology   4. Pulmonary hypertension due to COPD ([place])   5. Pure hypercholesterolemia   6. Primary hypertension    PLAN:    In order of problems listed above:  #Right sided chest pain: Patient with long history of right sided chest pain that is worse with exertion, deep breathing, or bearing down to go the bathroom. Pain is not relieved with nitro and patient states the nitro caused left sided chest discomfort so he stopped taking it. Has had extensive work-up including cath in 11/2019 which showed 30% LM but no other obstructive disease, TTE with severely enlarged RV and moderate-to-severe TR but normal LV systolic function, CTA chest without PE. Do not suspect current symptoms are due to cardiac etiology given extensive negative work-up. Agree with close follow-up with GI. -Cath 11/2019 with 30% LM but no obstructive CAD -TTE with severely enlarged and hypokinetic RV, PASP 100 but normal LV size and function with no WMA -No relief with SL-NTG; can stop medication -CTA without PE -Recommend GI follow-up as do not suspect this is cardiac in nature given extensive negative work-up  #Group III Pulmonary HTN #COPD on home O2 #Severe RV enlargement with severe hypokinesis: PASP 127mHg on TTE 06/24/20. PCWP 13 on RHC with mPAP 40 consistent with Group III PAH. Followed by Pulm. -Follow-up with Pulm as scheduled -Continue supplemental oxygen -PAH medications not indicated due to group III etiology -Appears euvolemic, not on diuretics  #Hyperlipidemia: LDL 146. ASCVD risk 17.2% with 30% LM disease on recent cath. -  Start crestor 64m daily -[place] #HTN: Well controlled. -Continue metoprolol  12.512mBID   Medication Adjustments/Labs and Tests Ordered: Current medicines are reviewed at length with the patient today.  Concerns regarding medicines are outlined above.  Orders Placed This Encounter  Procedures  . Lipid Profile  . Hepatic function panel   Meds ordered this encounter  Medications  . rosuvastatin (CRESTOR) 10 MG tablet    Sig: Take 1 tablet (10 mg total) by mouth daily.    Dispense:  90 tablet    Refill:  3    Patient Instructions  Medication Instructions:  Your physician has recommended you make the following change in your medication: Start Rosuvastatin 10 mg by mouth daily  *If you need a refill on your cardiac medications before your next appointment, please call your pharmacy*   Lab Work: Your physician recommends that you have lab work checked in [place] 6 weeks.  --Around December 6,2021.  This will be fasting. Lipid and liver profiles  If you have labs (blood work) drawn today and your tests are completely normal, you will receive your results only by: . Marland KitchenyChart Message (if you have MyChart) OR . A paper copy in the mail If you have any lab test that is abnormal or we need to change your treatment, we will call you to review the results.   Testing/Procedures: none   Follow-Up: [facility] needs are our [facility].  As part of our continuing mission to provide you with exceptional heart care, we have created designated Provider Care Teams.  These Care Teams include your primary Cardiologist (physician) and Advanced Practice Providers (APPs -  Physician Assistants and Nurse Practitioners) who all work together to provide you with the care you need, when you need it.  We recommend signing up for the patient portal called "MyChart".  Sign up information is provided on this After Visit Summary.  MyChart is used to connect with patients for Virtual Visits (Telemedicine).  Patients are able to view lab/test results,  encounter notes, upcoming appointments, etc.  Non-urgent messages can be sent to your provider as well.   To learn more about what you can do with MyChart, go to [email or web address]   Your next appointment:   3 months  The format for your next appointment:   In Person  Provider:   Needs to establish in [place] Other Instructions      Signed, HeFreada BergeronMD  07/06/2020 9:27 AM    [place]

## 2020-07-06 ENCOUNTER — Other Ambulatory Visit: Payer: Self-pay | Admitting: *Deleted

## 2020-07-06 ENCOUNTER — Other Ambulatory Visit: Payer: Self-pay

## 2020-07-06 ENCOUNTER — Ambulatory Visit (INDEPENDENT_AMBULATORY_CARE_PROVIDER_SITE_OTHER): Payer: Medicare Other | Admitting: Cardiology

## 2020-07-06 ENCOUNTER — Telehealth: Payer: Self-pay | Admitting: Internal Medicine

## 2020-07-06 ENCOUNTER — Telehealth: Payer: Self-pay | Admitting: Pulmonary Disease

## 2020-07-06 ENCOUNTER — Encounter: Payer: Self-pay | Admitting: Cardiology

## 2020-07-06 VITALS — BP 108/62 | HR 81 | Ht 63.0 in | Wt 118.6 lb

## 2020-07-06 DIAGNOSIS — E7800 Pure hypercholesterolemia, unspecified: Secondary | ICD-10-CM

## 2020-07-06 DIAGNOSIS — I2510 Atherosclerotic heart disease of native coronary artery without angina pectoris: Secondary | ICD-10-CM

## 2020-07-06 DIAGNOSIS — E782 Mixed hyperlipidemia: Secondary | ICD-10-CM

## 2020-07-06 DIAGNOSIS — R079 Chest pain, unspecified: Secondary | ICD-10-CM

## 2020-07-06 DIAGNOSIS — J9611 Chronic respiratory failure with hypoxia: Secondary | ICD-10-CM

## 2020-07-06 DIAGNOSIS — I10 Essential (primary) hypertension: Secondary | ICD-10-CM

## 2020-07-06 DIAGNOSIS — J449 Chronic obstructive pulmonary disease, unspecified: Secondary | ICD-10-CM

## 2020-07-06 DIAGNOSIS — I2723 Pulmonary hypertension due to lung diseases and hypoxia: Secondary | ICD-10-CM

## 2020-07-06 MED ORDER — ROSUVASTATIN CALCIUM 10 MG PO TABS: 10.0000 mg | ORAL_TABLET | Freq: Every day | ORAL | 3 refills | Status: AC

## 2020-07-06 NOTE — Telephone Encounter (Signed)
Pt called this afternoon to say that he couldn't do his MRI and was told that he would need something for anxiety. Please advise. [phone]

## 2020-07-06 NOTE — Patient Outreach (Signed)
[facility]) Care Management  07/06/2020  Bryan [name] [birth year] [id number]  THNoutreach to complex care patient  Bryan [name] was initially referred to [facility] on 10/07/19 as an [facility] UM referral for assistance with finding an affordable housing.[facility] SW services has been provided. Bryan [name] needs to follow up on the [facility] SW resources provided to him. He continues to state he was not preferring apartment living, has completed the forms and returned them to the local housing department but has not routinely followed up.  Insurance:[employer] medicare  Last ED visit on 05/19/20 for atypical chest pain ED 04/28/20 for non specific chest pain 04/19/20 for respiratory failure with admission  04/08/20 with chronic gastritis   Patient is able to verify HIPAA (Bryan [name] and Bryan [name]) identifiers Reviewed and addressed the purpose of the follow up call with the patient  Consent: [facility]) RN CM reviewed [place] with patient. Patient gave verbal consent for services.   Follow up cardiac evaluation 07/06/20 Patient confirms he was informed he is shortness of breath, chest pain and dizziness are due to cardiac etiology related extensive negative work-up. It was recommended he follow-up with GI. He reports he spoke with staff at Dr Sydell Axon office prior to [place] CM's outreach  [facility] RN CM Outreach to Bryan [name] at Dr Sydell Axon office [phone] Pt was scheduled to go to [facility] MRI on 07/03/20 but no showed  Outreach to [employer] at [facility] radiology [phone] Pt rescheduled for 07/17/20 at 2 pm needs to be there at 1330 and not to eat or drink for 4 hours before MRI   She discussed that sedation generally is not recommended as it can alter the MRI results if he goes to sleep   [facility] RN CM returned a call to Bryan [name]  He confirms he did show up for the MRI on 07/03/20 in [place] but he confirms he was not able to  complete the MRI related to shortness of breath (sob) and anxiety as he was lying flat in the machine [facility] RN CM reviewed that the MRI was rescheduled to Friday, 06/16/20 2 pm in [place]  Discussed he needs to be there at 1330 and not to eat or drink for 4 hours before MRI  Discussed with him that sedation generally is not recommended as it can alter the MRI results if he goes to sleep He was encouraged to discussed getting a medication from Dr Sydell Axon to only relax him for the MRI. The action plan reviewed x 2 He voiced understanding and was encouraged to call with any questions     [facility] RN CM will follow up with Bryan [name] with in the next 14-21 business days  Pt encouraged to return a call to [facility] RN CM prn   [name], RN, BSN, [place] Coordinator Office number [phone] Main [facility] number [phone] Fax number [phone]

## 2020-07-06 NOTE — Patient Instructions (Signed)
Medication Instructions:  Your physician has recommended you make the following change in your medication: Start Rosuvastatin 10 mg by mouth daily  *If you need a refill on your cardiac medications before your next appointment, please call your pharmacy*   Lab Work: Your physician recommends that you have lab work checked in [place] in 6 weeks.  --Around December 6,2021.  This will be fasting. Lipid and liver profiles  If you have labs (blood work) drawn today and your tests are completely normal, you will receive your results only by: Marland Kitchen MyChart Message (if you have MyChart) OR . A paper copy in the mail If you have any lab test that is abnormal or we need to change your treatment, we will call you to review the results.   Testing/Procedures: none   Follow-Up: At [facility], you and your health needs are our priority.  As part of our continuing mission to provide you with exceptional heart care, we have created designated Provider Care Teams.  These Care Teams include your primary Cardiologist (physician) and Advanced Practice Providers (APPs -  Physician Assistants and Nurse Practitioners) who all work together to provide you with the care you need, when you need it.  We recommend signing up for the patient portal called "MyChart".  Sign up information is provided on this After Visit Summary.  MyChart is used to connect with patients for Virtual Visits (Telemedicine).  Patients are able to view lab/test results, encounter notes, upcoming appointments, etc.  Non-urgent messages can be sent to your provider as well.   To learn more about what you can do with MyChart, go to [email or web address].    Your next appointment:   3 months  The format for your next appointment:   In Person  Provider:   Needs to establish in [place] office Other Instructions

## 2020-07-06 NOTE — Telephone Encounter (Signed)
ONO on 1.5 lpm was ordered and pt is aware to be expecting a call to schedule this.

## 2020-07-06 NOTE — Telephone Encounter (Signed)
Spoke with the pt  He MRI he is calling about was ordered by Dr Gala Romney  I asked him to please call their office for concerns about this scan  Pt verbalized understanding

## 2020-07-06 NOTE — Telephone Encounter (Signed)
Spoke with pt. Pt is asking if he can have some medication to calm him down during his MRI. Pt is nervous about the procedure.

## 2020-07-07 ENCOUNTER — Encounter (HOSPITAL_COMMUNITY): Payer: Medicare Other

## 2020-07-07 ENCOUNTER — Other Ambulatory Visit: Payer: Self-pay

## 2020-07-07 MED ORDER — LORAZEPAM 2 MG PO TABS
ORAL_TABLET | ORAL | 0 refills | Status: DC
Start: 2020-07-07 — End: 2020-07-21

## 2020-07-07 NOTE — Telephone Encounter (Signed)
Prescription for Ativan 2 mg p.o. x1 dose taken with a small amount of water 1 hour before MRI.  No refills.

## 2020-07-07 NOTE — Telephone Encounter (Signed)
Noted RX was faxed to pts pharmacy. Spoke with pt and he's aware of RX that was sent to his pharmacy.

## 2020-07-09 ENCOUNTER — Encounter (HOSPITAL_COMMUNITY)
Admission: RE | Admit: 2020-07-09 | Discharge: 2020-07-09 | Disposition: A | Discharge status: Home / Self Care | Payer: Medicare Other | Source: Ambulatory Visit | Attending: Pulmonary Disease | Admitting: Pulmonary Disease

## 2020-07-09 ENCOUNTER — Other Ambulatory Visit: Payer: Self-pay

## 2020-07-09 DIAGNOSIS — J432 Centrilobular emphysema: Secondary | ICD-10-CM | POA: Diagnosis not present

## 2020-07-09 NOTE — Progress Notes (Signed)
Daily Session Note  Patient Details  Name: Bryan [name] MRN: [id number] Date of Birth: 1955/11/28 Referring Provider:     PULMONARY REHAB OTHER RESP ORIENTATION from 06/25/2020 in [place]  Referring Provider Dr. Halford Chessman      Encounter Date: 07/09/2020  Check In:  Session Check In - 07/09/20 1045      Check-In   Supervising physician immediately available to respond to emergencies See telemetry face sheet for immediately available ER MD    Physician(s) Dr. Domenic Polite    Location [facility]    Staff Present Hoy Register, MS, ACSM-CEP, Exercise Physiologist;Ailana Cuadrado Wynetta Emery, RN, BSN    Virtual Visit No    Medication changes reported     No    Fall or balance concerns reported    No    Tobacco Cessation No Change    Warm-up and Cool-down Performed as group-led instruction    Resistance Training Performed Yes    VAD Patient? No    PAD/SET Patient? No      Pain Assessment   Currently in Pain? No/denies    Pain Score 0-No pain    Multiple Pain Sites No           Capillary Blood Glucose: No results found for this or any previous visit (from the past 24 hour(s)).    Social History   Tobacco Use  Smoking Status Former Smoker  . Packs/day: 0.50  . Years: 40.00  . Pack years: 20.00  . Types: Cigarettes  . Quit date: 11/11/2014  . Years since quitting: 5.6  Smokeless Tobacco Never Used    Goals Met:  Proper associated with RPD/PD & O2 Sat Independence with exercise equipment Improved SOB with ADL's Using PLB without cueing & demonstrates good technique Exercise tolerated well No report of cardiac concerns or symptoms Strength training completed today  Goals Unmet:  Not Applicable  Comments: Check out 1145.   Dr. [name] is Medical Director for [facility].

## 2020-07-09 NOTE — Progress Notes (Signed)
Pulmonary Individual Treatment Plan  Patient Details  Name: Bryan [name] MRN: [id number] Date of Birth: [birth year] Referring Provider:     PULMONARY REHAB OTHER RESP ORIENTATION from 06/25/2020 in [facility]  Referring Provider Dr. Halford Chessman      Initial Encounter Date:    [place] from 06/25/2020 in [facility]  Date 06/25/20      Visit Diagnosis: Centrilobular emphysema ([place])  Patient's Home Medications on Admission:   Current Outpatient Medications:    acetaminophen (TYLENOL) 325 MG tablet, Take 2 tablets (650 mg total) by mouth every 6 (six) hours as needed for mild pain, fever or headache (or Fever >/= 101)., Disp: 12 tablet, Rfl: 0   albuterol (PROVENTIL) (2.5 MG/3ML) 0.083% nebulizer solution, Take 3 mLs (2.5 mg total) by nebulization every 4 (four) hours as needed for wheezing or shortness of breath., Disp: 75 mL, Rfl: 5   albuterol (VENTOLIN HFA) 108 (90 Base) MCG/ACT inhaler, Inhale 2 puffs into the lungs every 4 (four) hours as needed for wheezing or shortness of breath. Do not use with nebulizer, Disp: 18 g, Rfl: 2   calcium carbonate (TUMS - DOSED IN MG ELEMENTAL CALCIUM) 500 MG chewable tablet, Chew 2 tablets (400 mg of elemental calcium total) by mouth 3 (three) times daily as needed for indigestion or heartburn., Disp: 30 tablet, Rfl: 0   Fluticasone-Umeclidin-Vilant (TRELEGY ELLIPTA) 100-62.5-25 MCG/INH AEPB, Inhale 1 puff into the lungs daily., Disp: 28 each, Rfl: 5   gabapentin (NEURONTIN) 300 MG capsule, Take 1 capsule (300 mg total) by mouth 2 (two) times daily. Pt. Says he is taking twice daily., Disp: , Rfl:    guaiFENesin (MUCINEX) 600 MG 12 hr tablet, Take 1 tablet (600 mg total) by mouth 2 (two) times daily., Disp: 60 tablet, Rfl: 2   linaclotide (LINZESS) 72 MCG capsule, Take 1 capsule (72 mcg total) by mouth daily before breakfast., Disp: 90 capsule, Rfl: 3   LORazepam (ATIVAN) 2 MG  tablet, 2 mg po x 1 dose 1 hour before MRI, Disp: 2 tablet, Rfl: 0   magnesium oxide (MAG-OX) 400 MG tablet, Take 400 mg by mouth daily., Disp: , Rfl:    methocarbamol (ROBAXIN) 500 MG tablet, Take 1 tablet (500 mg total) by mouth every 8 (eight) hours as needed for muscle spasms., Disp: 30 tablet, Rfl: 0   metoprolol tartrate (LOPRESSOR) 25 MG tablet, Take 0.5 tablets (12.5 mg total) by mouth 2 (two) times daily., Disp: 30 tablet, Rfl: 1   nitroGLYCERIN (NITROSTAT) 0.4 MG SL tablet, Place 1 tablet (0.4 mg total) under the tongue every 5 (five) minutes as needed for chest pain., Disp: 30 tablet, Rfl: 0   OXYGEN, Inhale 2.5 L into the lungs daily., Disp: , Rfl:    pantoprazole (PROTONIX) 40 MG tablet, Take 1 tablet (40 mg total) by mouth 2 (two) times daily before a meal., Disp: 90 tablet, Rfl: 0   rosuvastatin (CRESTOR) 10 MG tablet, Take 1 tablet (10 mg total) by mouth daily., Disp: 90 tablet, Rfl: 3   sucralfate (CARAFATE) 1 GM/10ML suspension, Take 10 mLs (1 g total) by mouth 4 (four) times daily -  with meals and at bedtime., Disp: 420 mL, Rfl: 3   torsemide (DEMADEX) 20 MG tablet, Take 1 tablet (20 mg total) by mouth 2 (two) times daily. For Fluid/Heart, Disp: 60 tablet, Rfl: 2  Past Medical History: Past Medical History:  Diagnosis Date   Abdominal pain    Acid  reflux    Arthritis    Asthma    Chest pain    Chronic pain    with leg and back pain (disc problem)   COPD (chronic obstructive pulmonary disease) (HCC)    Diabetes mellitus without complication (HCC)    Epigastric pain    Headache    HX OF   History of upper GI x-ray series    to follow showed large duodenal ulcer H pylori serologies were negative   Hypertension    NSTEMI (non-ST elevated myocardial infarction) ([place]) 11/2019   Pneumonia    07/17/17   Pre-diabetes    Stroke ([place])    TIA MINI STROKE   Tubular adenoma     Tobacco Use: Social History   Tobacco Use  Smoking Status Former  Smoker   Packs/day: 0.50   Years: 40.00   Pack years: 20.00   Types: Cigarettes   Quit date: 11/11/2014   Years since quitting: 5.6  Smokeless Tobacco Never Used    Labs: Recent Review Scientist, physiological    Labs for ITP Cardiac and Pulmonary Rehab Latest Ref Rng & Units 06/14/2019 08/03/2019 11/24/2019 12/03/2019 02/12/2020   Cholestrol 0 - 200 mg/dL - - 241(H) - -   LDLCALC 0 - 99 mg/dL - - 146(H) - -   HDL >40 mg/dL - - 86 - -   Trlycerides <150 mg/dL - - 45 - -   Hemoglobin A1c 4.8 - 5.6 % 7.0(H) 6.8(H) 6.7(H) - 6.7(H)   PHART 7.35 - 7.45 - - - - -   PCO2ART 32 - 48 mmHg - - - - -   HCO3 20.0 - 28.0 mmol/L - - - 39.8(H) -   TCO2 22 - 32 mmol/L - - - 42(H) -   O2SAT % - - - 64.0 -      Capillary Blood Glucose: Lab Results  Component Value Date   GLUCAP 169 (H) 04/22/2020   GLUCAP 135 (H) 04/22/2020   GLUCAP 112 (H) 04/22/2020   GLUCAP 141 (H) 04/21/2020   GLUCAP 158 (H) 04/21/2020     Pulmonary Assessment Scores:  Pulmonary Assessment Scores    Row Name 06/25/20 1012         ADL UCSD   ADL Phase Entry     SOB Score total 66       CAT Score   CAT Score 20       mMRC Score   mMRC Score 4           UCSD: Self-administered rating of dyspnea associated with activities of daily living (ADLs) 6-point scale (0 = "not at all" to 5 = "maximal or unable to do because of breathlessness")  Scoring Scores range from 0 to 120.  Minimally important difference is 5 units  CAT: CAT can identify the health impairment of COPD patients and is better correlated with disease progression.  CAT has a scoring range of zero to 40. The CAT score is classified into four groups of low (less than 10), medium (10 - 20), high (21-30) and very high (31-40) based on the impact level of disease on health status. A CAT score over 10 suggests significant symptoms.  A worsening CAT score could be explained by an exacerbation, poor medication adherence, poor inhaler technique, or progression of  COPD or comorbid conditions.  CAT MCID is 2 points  mMRC: mMRC (Modified Medical Research Council) Dyspnea Scale is used to assess the degree of baseline functional disability in patients of respiratory  disease due to dyspnea. No minimal important difference is established. A decrease in score of 1 point or greater is considered a positive change.   Pulmonary Function Assessment:   Exercise Target Goals: Exercise Program Goal: Individual exercise prescription set using results from initial 6 min walk test and THRR while considering  patients activity barriers and safety.   Exercise Prescription Goal: Initial exercise prescription builds to 30-45 minutes a day of aerobic activity, 2-3 days per week.  Home exercise guidelines will be given to patient during program as part of exercise prescription that the participant will acknowledge.  Activity Barriers & Risk Stratification:   6 Minute Walk:  6 Minute Walk    Row Name 06/25/20 1012         6 Minute Walk   Phase Initial     Distance 400 feet     Walk Time 6 minutes     # of Rest Breaks 2     MPH 0.76     METS 1.59     RPE 5.55     Perceived Dyspnea  11     VO2 Peak 11     Symptoms Yes (comment)     Comments Pt took a seated rest break from 1.5 minutes after 200 ft due to SOB, leg fatigue, and back pain. He resumed walking for 200 more feet and reported 8/10 chest pain. The test was terminated 1 minute 15 seconds early due to this. The chest pain is brought on by exertion, and went away with rest.     Resting HR 80 bpm     Resting BP 90/60     Resting Oxygen Saturation  98 %     Exercise Oxygen Saturation  during 6 min walk 91 %     Max Ex. HR 99 bpm     Max Ex. BP 86/58     2 Minute Post BP 102/64       Interval HR   1 Minute HR 94     2 Minute HR 96     3 Minute HR 94     4 Minute HR 97     5 Minute HR 99     6 Minute HR 97     2 Minute Post HR 83     Interval Heart Rate? Yes       Interval Oxygen   Interval  Oxygen? Yes     Baseline Oxygen Saturation % 98 %     1 Minute Oxygen Saturation % 97 %     1 Minute Liters of Oxygen 2 L     2 Minute Oxygen Saturation % 94 %     2 Minute Liters of Oxygen 2 L     3 Minute Oxygen Saturation % 91 %     3 Minute Liters of Oxygen 2 L     4 Minute Oxygen Saturation % 91 %     4 Minute Liters of Oxygen 2 L     5 Minute Oxygen Saturation % 91 %     5 Minute Liters of Oxygen 2 L     6 Minute Oxygen Saturation % 92 %     6 Minute Liters of Oxygen 2 L     2 Minute Post Oxygen Saturation % 93 %     2 Minute Post Liters of Oxygen 2 L            Oxygen Initial Assessment:  Oxygen Initial Assessment - 06/25/20  [place] Oxygen Device Home Concentrator;E-Tanks    Home Exercise Oxygen Prescription Continuous    Liters per minute 2    Home Resting Oxygen Prescription Continuous    Liters per minute 2    Compliance with Home Oxygen Use Yes      Initial 6 min Walk   Oxygen Used Continuous    Liters per minute 2      Program Oxygen Prescription   Program Oxygen Prescription Continuous    Liters per minute 2      Intervention   Short Term Goals To learn and understand importance of monitoring SPO2 with pulse oximeter and demonstrate accurate use of the pulse oximeter.;To learn and exhibit compliance with exercise, home and travel O2 prescription;To learn and understand importance of maintaining oxygen saturations>88%;To learn and demonstrate proper pursed lip breathing techniques or other breathing techniques.;To learn and demonstrate proper use of respiratory medications    Long  Term Goals Exhibits compliance with exercise, home and travel O2 prescription;Verbalizes importance of monitoring SPO2 with pulse oximeter and return demonstration;Maintenance of O2 saturations>88%;Exhibits proper breathing techniques, such as pursed lip breathing or other method taught during program session;Compliance with respiratory medication            Oxygen Re-Evaluation:   Oxygen Discharge (Final Oxygen Re-Evaluation):   Initial Exercise Prescription:  Initial Exercise Prescription - 06/25/20 1000      Date of Initial Exercise RX and Referring Provider   Date 06/25/20    Referring Provider Dr. Halford Chessman    Expected Discharge Date 10/29/20      Oxygen   Oxygen Continuous    Liters 2      NuStep   Level 1    SPM 60    Minutes 39      Prescription Details   Frequency (times per week) 2    Duration Progress to 30 minutes of continuous aerobic without signs/symptoms of physical distress      Intensity   THRR 40-80% of Max Heartrate 62-125    Ratings of Perceived Exertion 11-13    Perceived Dyspnea 0-4      Resistance Training   Training Prescription Yes    Weight 1    Reps 10-15           Perform Capillary Blood Glucose checks as needed.  Exercise Prescription Changes:   Exercise Comments:   Exercise Goals and Review:  Exercise Goals    Row Name 06/25/20 1017 07/07/20 1231           Exercise Goals   Increase Physical Activity Yes Yes      Intervention Provide advice, education, support and counseling about physical activity/exercise needs.;Develop an individualized exercise prescription for aerobic and resistive training based on initial evaluation findings, risk stratification, comorbidities and participant's personal goals. Provide advice, education, support and counseling about physical activity/exercise needs.;Develop an individualized exercise prescription for aerobic and resistive training based on initial evaluation findings, risk stratification, comorbidities and participant's personal goals.      Expected Outcomes Short Term: Attend rehab on a regular basis to increase amount of physical activity.;Long Term: Add in home exercise to make exercise part of routine and to increase amount of physical activity.;Long Term: Exercising regularly at least 3-5 days a week. Short Term: Attend rehab on a  regular basis to increase amount of physical activity.;Long Term: Add in home exercise to make exercise part of routine and to increase amount of physical  activity.;Long Term: Exercising regularly at least 3-5 days a week.      Increase Strength and Stamina Yes Yes      Intervention Provide advice, education, support and counseling about physical activity/exercise needs.;Develop an individualized exercise prescription for aerobic and resistive training based on initial evaluation findings, risk stratification, comorbidities and participant's personal goals. Provide advice, education, support and counseling about physical activity/exercise needs.;Develop an individualized exercise prescription for aerobic and resistive training based on initial evaluation findings, risk stratification, comorbidities and participant's personal goals.      Expected Outcomes Short Term: Increase workloads from initial exercise prescription for resistance, speed, and METs.;Short Term: Perform resistance training exercises routinely during rehab and add in resistance training at home;Long Term: Improve cardiorespiratory fitness, muscular endurance and strength as measured by increased METs and functional capacity (6MWT) Short Term: Increase workloads from initial exercise prescription for resistance, speed, and METs.;Short Term: Perform resistance training exercises routinely during rehab and add in resistance training at home;Long Term: Improve cardiorespiratory fitness, muscular endurance and strength as measured by increased METs and functional capacity (6MWT)      Able to understand and use rate of perceived exertion (RPE) scale Yes Yes      Intervention Provide education and explanation on how to use RPE scale Provide education and explanation on how to use RPE scale      Expected Outcomes Short Term: Able to use RPE daily in rehab to express subjective intensity level;Long Term:  Able to use RPE to guide intensity level when  exercising independently Short Term: Able to use RPE daily in rehab to express subjective intensity level;Long Term:  Able to use RPE to guide intensity level when exercising independently      Able to understand and use Dyspnea scale Yes Yes      Intervention Provide education and explanation on how to use Dyspnea scale Provide education and explanation on how to use Dyspnea scale      Expected Outcomes Short Term: Able to use Dyspnea scale daily in rehab to express subjective sense of shortness of breath during exertion;Long Term: Able to use Dyspnea scale to guide intensity level when exercising independently Short Term: Able to use Dyspnea scale daily in rehab to express subjective sense of shortness of breath during exertion;Long Term: Able to use Dyspnea scale to guide intensity level when exercising independently      Knowledge and understanding of Target Heart Rate Range (THRR) Yes Yes      Intervention Provide education and explanation of THRR including how the numbers were predicted and where they are located for reference Provide education and explanation of THRR including how the numbers were predicted and where they are located for reference      Expected Outcomes Short Term: Able to state/look up THRR;Long Term: Able to use THRR to govern intensity when exercising independently;Short Term: Able to use daily as guideline for intensity in rehab Short Term: Able to state/look up THRR;Long Term: Able to use THRR to govern intensity when exercising independently;Short Term: Able to use daily as guideline for intensity in rehab      Understanding of Exercise Prescription Yes Yes      Intervention Provide education, explanation, and written materials on patient's individual exercise prescription Provide education, explanation, and written materials on patient's individual exercise prescription      Expected Outcomes Short Term: Able to explain program exercise prescription;Long Term: Able to explain  home exercise prescription to exercise independently Short Term: Able to explain  program exercise prescription;Long Term: Able to explain home exercise prescription to exercise independently             Exercise Goals Re-Evaluation :  Exercise Goals Re-Evaluation    Row Name 07/07/20 1231             Exercise Goal Re-Evaluation   Exercise Goals Review Increase Physical Activity;Able to understand and use rate of perceived exertion (RPE) scale;Increase Strength and Stamina;Able to understand and use Dyspnea scale;Knowledge and understanding of Target Heart Rate Range (THRR);Understanding of Exercise Prescription       Comments Pt will begin exercise 10/28. I held him out for his first few scheduled visits because he had 8/10 chest pain during his walk test. He has had a work up with cardiology and they do not believe that this pain is due to any heart blockages. Will monitor and progress as able once he begins the program.       Expected Outcomes Through exercise at rehab and by engaging in a home exercise program, the pt will reach their goals.              Discharge Exercise Prescription (Final Exercise Prescription Changes):   Nutrition:  Target Goals: Understanding of nutrition guidelines, daily intake of sodium <1543m, cholesterol <2021m calories 30% from fat and 7% or less from saturated fats, daily to have 5 or more servings of fruits and vegetables.  Biometrics:  Pre Biometrics - 06/25/20 1017      Pre Biometrics   Height _0  (1.6 m)    Weight 53.3 kg    Waist Circumference 32.5 inches    Hip Circumference 34.5 inches    Waist to Hip Ratio 0.94 %    BMI (Calculated) 20.82    Triceps Skinfold 9 mm    % Body Fat 19.8 %    Grip Strength 28.3 kg    Flexibility 0 in    Single Leg Stand 60 seconds            Nutrition Therapy Plan and Nutrition Goals:  Nutrition Therapy & Goals - 06/25/20 0933      Personal Nutrition Goals   Comments Patient scored 30 on his  medficts diet assessemt scoreing highest in eggs. He says he eats 2 eggs/day. Reviewed score with patient and handout given and reviewed regarding making healthier choices. He says he is not interested in changing his diet. He is being monitored for pre-diabetes. Fasting glucose this am 96 mg/dl. He says he watches his sugar intake. Will continue to monitor.      Intervention Plan   Intervention Nutrition handout(s) given to patient.           Nutrition Assessments:  Nutrition Assessments - 06/25/20 0937      MEDFICTS Scores   Pre Score 30           Nutrition Goals Re-Evaluation:   Nutrition Goals Discharge (Final Nutrition Goals Re-Evaluation):   Psychosocial: Target Goals: Acknowledge presence or absence of significant depression and/or stress, maximize coping skills, provide positive support system. Participant is able to verbalize types and ability to use techniques and skills needed for reducing stress and depression.  Initial Review & Psychosocial Screening:  Initial Psych Review & Screening - 06/25/20 0930      Initial Review   Current issues with None Identified      Family Dynamics   Good Support System? Yes    Comments Patient lives with his adult son. He says he has  very good support from his children and 2 sisters and friends. He has a positive outlook regarding his future and is looking forward to participating in the program. He denies any depression or anxiety.      Barriers   Psychosocial barriers to participate in program There are no identifiable barriers or psychosocial needs.      Screening Interventions   Interventions Encouraged to exercise;Provide feedback about the scores to participant           Quality of Life Scores:  Quality of Life - 06/25/20 1018      Quality of Life   Select Quality of Life      Quality of Life Scores   Health/Function Pre 12.66 %    Socioeconomic Pre 16.58 %    Psych/Spiritual Pre 26.21 %    Family Pre 26.75 %     GLOBAL Pre 17.95 %          Scores of 19 and below usually indicate a poorer quality of life in these areas.  A difference of  2-3 points is a clinically meaningful difference.  A difference of 2-3 points in the total score of the Quality of Life Index has been associated with significant improvement in overall quality of life, self-image, physical symptoms, and general health in studies assessing change in quality of life.   PHQ-9: Recent Review Flowsheet Data    Depression screen [facility] 2/9 06/25/2020 03/10/2020 02/24/2020 02/17/2020 01/15/2020   Decreased Interest 0 0 0 0 0   Down, Depressed, Hopeless 0 0 0 0 0   PHQ - 2 Score 0 0 0 0 0   Altered sleeping 2 - - - -   Tired, decreased energy 2 - - - -   Change in appetite 0 - - - -   Feeling bad or failure about yourself  0 - - - -   Trouble concentrating 0 - - - -   Moving slowly or fidgety/restless 0 - - - -   Suicidal thoughts 0 - - - -   PHQ-9 Score 4 - - - -   Difficult doing work/chores Somewhat difficult - - - -     Interpretation of Total Score  Total Score Depression Severity:  1-4 = Minimal depression, 5-9 = Mild depression, 10-14 = Moderate depression, 15-19 = Moderately severe depression, 20-27 = Severe depression   Psychosocial Evaluation and Intervention:  Psychosocial Evaluation - 06/25/20 0931      Psychosocial Evaluation & Interventions   Interventions Stress management education;Relaxation education;Encouraged to exercise with the program and follow exercise prescription    Comments Patient's inital overall OQL score was 17.95% scoring lowest in health 12.66% and socioeconomic 16.58% and his PHQ-9 score is 4 scoring high in having little energy and not being able to stay asleep due to nocturia. No psychosocial issues identifed at his orientation visit. Will continue to monitor.    Expected Outcomes Patient will have no psychosocial issues identified at discharge.           Psychosocial  Re-Evaluation:   Psychosocial Discharge (Final Psychosocial Re-Evaluation):    Education: Education Goals: Education classes will be provided on a weekly basis, covering required topics. Participant will state understanding/return demonstration of topics presented.  Learning Barriers/Preferences:  Learning Barriers/Preferences - 06/25/20 5093      Learning Barriers/Preferences   Learning Barriers None    Learning Preferences Audio           Education Topics: How Lungs Work and  Diseases: - Discuss the anatomy of the lungs and diseases that can affect the lungs, such as COPD.   Exercise: -Discuss the importance of exercise, FITT principles of exercise, normal and abnormal responses to exercise, and how to exercise safely.   Environmental Irritants: -Discuss types of environmental irritants and how to limit exposure to environmental irritants.   Meds/Inhalers and oxygen: - Discuss respiratory medications, definition of an inhaler and oxygen, and the proper way to use an inhaler and oxygen.   Energy Saving Techniques: - Discuss methods to conserve energy and decrease shortness of breath when performing activities of daily living.    Bronchial Hygiene / Breathing Techniques: - Discuss breathing mechanics, pursed-lip breathing technique,  proper posture, effective ways to clear airways, and other functional breathing techniques   Cleaning Equipment: - Provides group verbal and written instruction about the health risks of elevated stress, cause of high stress, and healthy ways to reduce stress.   Nutrition I: Fats: - Discuss the types of cholesterol, what cholesterol does to the body, and how cholesterol levels can be controlled.   Nutrition II: Labels: -Discuss the different components of food labels and how to read food labels.   Respiratory Infections: - Discuss the signs and symptoms of respiratory infections, ways to prevent respiratory infections, and the  importance of seeking medical treatment when having a respiratory infection.   Stress I: Signs and Symptoms: - Discuss the causes of stress, how stress may lead to anxiety and depression, and ways to limit stress.   Stress II: Relaxation: -Discuss relaxation techniques to limit stress.   Oxygen for Home/Travel: - Discuss how to prepare for travel when on oxygen and proper ways to transport and store oxygen to ensure safety.   Knowledge Questionnaire Score:  Knowledge Questionnaire Score - 06/25/20 0937      Knowledge Questionnaire Score   Pre Score 12/18           Core Components/Risk Factors/Patient Goals at Admission:  Personal Goals and Risk Factors at Admission - 06/25/20 0938      Core Components/Risk Factors/Patient Goals on Admission    Weight Management Weight Maintenance    Improve shortness of breath with ADL's Yes    Intervention Provide education, individualized exercise plan and daily activity instruction to help decrease symptoms of SOB with activities of daily living.    Expected Outcomes Short Term: Improve cardiorespiratory fitness to achieve a reduction of symptoms when performing ADLs;Long Term: Be able to perform more ADLs without symptoms or delay the onset of symptoms    Personal Goal Other Yes    Personal Goal Get stronger; breathe better; be able to do ADL's with less SOB; get off of O2 eventually.    Intervention Patient will attend PR 2 days/week and supplement with exercise at home 3 days/week.    Expected Outcomes Patient will meet both personal and program goals.           Core Components/Risk Factors/Patient Goals Review:    Core Components/Risk Factors/Patient Goals at Discharge (Final Review):    ITP Comments:  ITP Comments    Row Name 07/06/20 1521           ITP Comments Patient has not started pulmonary rehab. He is being evaluated by cardiology for 8/10 chest pain during walk test. Will continue to monitor for start date.               Comments: Patient has not started pulmonary rehab. He is being evaluated by  cardiology for 8/10 chest pain during walk test. Will continue to monitor for start date.

## 2020-07-14 ENCOUNTER — Encounter (HOSPITAL_COMMUNITY)
Admission: RE | Admit: 2020-07-14 | Discharge: 2020-07-14 | Disposition: A | Discharge status: Home / Self Care | Payer: Medicare Other | Source: Ambulatory Visit | Attending: Pulmonary Disease | Admitting: Pulmonary Disease

## 2020-07-14 ENCOUNTER — Other Ambulatory Visit: Payer: Self-pay

## 2020-07-14 DIAGNOSIS — J432 Centrilobular emphysema: Secondary | ICD-10-CM | POA: Diagnosis not present

## 2020-07-14 NOTE — Progress Notes (Signed)
Daily Session Note  Patient Details  Name: NESTA KIMPLE MRN: [id number] Date of Birth: 02/09/1956 Referring Provider:     PULMONARY REHAB OTHER RESP ORIENTATION from 06/25/2020 in [place]  Referring Provider Dr. Halford Chessman      Encounter Date: 07/14/2020  Check In:  Session Check In - 07/14/20 1049      Check-In   Supervising physician immediately available to respond to emergencies [facility] MD immediately available    Physician(s) Branch    Location [facility]    Staff Present Geanie Cooley, RN;Dalton Kris Mouton, MS, ACSM-CEP, Exercise Physiologist    Virtual Visit No    Medication changes reported     No    Fall or balance concerns reported    No    Tobacco Cessation No Change    Warm-up and Cool-down Performed as group-led instruction    Resistance Training Performed Yes    VAD Patient? No    PAD/SET Patient? No      Pain Assessment   Currently in Pain? No/denies    Pain Score 0-No pain    Multiple Pain Sites No           Capillary Blood Glucose: No results found for this or any previous visit (from the past 24 hour(s)).    Social History   Tobacco Use  Smoking Status Former Smoker  . Packs/day: 0.50  . Years: 40.00  . Pack years: 20.00  . Types: Cigarettes  . Quit date: 11/11/2014  . Years since quitting: 5.6  Smokeless Tobacco Never Used    Goals Met:  Proper associated with RPD/PD & O2 Sat Independence with exercise equipment Improved SOB with ADL's Exercise tolerated well No report of cardiac concerns or symptoms Strength training completed today  Goals Unmet:  Not Applicable  Comments: check out @ 11:45   Dr. [name] is Medical Director for [facility] Pulmonary Re[facility].

## 2020-07-16 ENCOUNTER — Other Ambulatory Visit: Payer: Self-pay

## 2020-07-16 ENCOUNTER — Encounter (HOSPITAL_COMMUNITY)
Admission: RE | Admit: 2020-07-16 | Discharge: 2020-07-16 | Disposition: A | Discharge status: Home / Self Care | Payer: Medicare Other | Source: Ambulatory Visit | Attending: Pulmonary Disease | Admitting: Pulmonary Disease

## 2020-07-16 VITALS — Wt 120.2 lb

## 2020-07-16 DIAGNOSIS — J432 Centrilobular emphysema: Secondary | ICD-10-CM

## 2020-07-16 NOTE — Progress Notes (Signed)
Incomplete Session Note  Patient Details  Name: Bryan [name] MRN: [id number] Date of Birth: Jul 01, 1956 Referring Provider:     PULMONARY REHAB OTHER RESP ORIENTATION from 06/25/2020 in [place]  Referring Provider Dr. Wyatt Haste [name] did not complete his rehab session.  He completed the 10 minute warm up and after 2 minutes of exercise at his first station he stopped due to stomach pain 8/10. This pain has been ongoing for months and he has an MRI and other tests scheduled for tomorrow. Pt's blood pressure was 86/60 before he left. Pt given water.

## 2020-07-17 ENCOUNTER — Ambulatory Visit (HOSPITAL_COMMUNITY)
Admission: RE | Admit: 2020-07-17 | Discharge: 2020-07-17 | Disposition: A | Discharge status: Home / Self Care | Payer: Medicare Other | Source: Ambulatory Visit | Attending: Internal Medicine | Admitting: Internal Medicine

## 2020-07-17 ENCOUNTER — Other Ambulatory Visit: Payer: Self-pay

## 2020-07-17 ENCOUNTER — Telehealth: Payer: Self-pay | Admitting: Pulmonary Disease

## 2020-07-17 DIAGNOSIS — R1084 Generalized abdominal pain: Secondary | ICD-10-CM | POA: Insufficient documentation

## 2020-07-17 DIAGNOSIS — I70203 Unspecified atherosclerosis of native arteries of extremities, bilateral legs: Secondary | ICD-10-CM | POA: Insufficient documentation

## 2020-07-17 DIAGNOSIS — I708 Atherosclerosis of other arteries: Secondary | ICD-10-CM | POA: Diagnosis not present

## 2020-07-17 MED ORDER — GADOBUTROL 1 MMOL/ML IV SOLN
6.0000 mL | Freq: Once | INTRAVENOUS | Status: AC | PRN
  Administered 2020-07-17: 6 mL via INTRAVENOUS

## 2020-07-17 NOTE — Telephone Encounter (Signed)
ONO with 2 liters 07/07/20 >> test time 11 hrs 26 min.  Baseline SpO2 85%, low SpO2 61%.  Spent 9 hrs 43 min with SpO2 < 88%.  Desaturation pattern suggestive of sleep apnea.   Please let him know that his oxygen level is still low at night despite being on supplemental oxygen.  His oxygen level pattern is suggestive that he might have sleep apnea in addition to COPD causing trouble with his oxygen.  Please schedule ROV with me or NP to discuss in more detail since he will need to have a face to face visit to determine if he needs a sleep study to assess for sleep apnea.

## 2020-07-17 NOTE — Telephone Encounter (Addendum)
Called and went over ONO results per [name]. All questions answered and patient expressed understanding. Office visit scheduled for Tuesday 07/21/2020 at 8:45am at the [place] office. Patient agreeable to time, date and location.

## 2020-07-20 ENCOUNTER — Other Ambulatory Visit: Payer: Self-pay

## 2020-07-20 ENCOUNTER — Other Ambulatory Visit: Payer: Self-pay | Admitting: *Deleted

## 2020-07-20 ENCOUNTER — Telehealth: Payer: Self-pay | Admitting: Pulmonary Disease

## 2020-07-20 NOTE — Patient Outreach (Signed)
[facility]) Care Management  07/20/2020  Bryan [name] 1956-01-09 [id number]   THNoutreach to complex care patient  Bryan [name] was initially referred to [facility] on 10/07/19 as an [facility] UM referral for assistance with finding an affordable housing.[facility] SW services has been provided. Bryan [name] needs to follow up on the [facility] SW resources provided to him. He continues to state he was not preferring apartment living, has completed the forms and returned them to the local housing department but has not routinely followed up.  Insurance:[employer] medicare  Last ED visit on 05/19/20 for atypical chest pain ED 04/28/20 for non specific chest pain 04/19/20 for respiratory failure with admission  04/08/20 with chronic gastritis  Patient is able to verify HIPAA ([place]) identifiers Reviewed and addressed the purpose of the follow up call with the patient- F/u GI   Consent: [facility] RN CM reviewed [facility] services with patient. Patient gave verbal consent for services.  Follow up   Cardiac rehab therapies Bryan [name] reports his cardiac rehab sessions are  "rough",  not tolerating well  He reports on 07/16/20 he had stomach issues and the session was discontinued   MRI follow up  [facility] RN CM completed a conference call with Bryan [name] to his gastroenterologist (Dr Sydell Axon) and pulmonologist ([name]) offices Bryan [name] is wanting to know results of his 07/17/20 abdominal MRI.  Side effects from ativan He reports he was given a sedative prior to the MRI He reports he was "out of it", dizzy, anxiety "for two days" with nausea on last day. Bryan [name] reports he picked up a Prescription (Rx) sent to the pharmacy by Dr Sydell Axon. [facility] RN CM was able to review Dr Sydell Axon notes to indicate Bryan [name] was provided Ativan 2 mg prior his 07/17/20 MRI. He reports his son went with him and reported he was not able to walk well without assist on 07/17/20. Bryan  [name] reports the side effects of Ativan did not resolve until 07/19/20, Sunday.  Educated Bryan [name] on sleep apnea, sleep apnea study process and  Ativan (Lorazepam) purpose and side effects.  Bryan [name] located his Ativan medicine bottle and wrote down Lorazepam (Ativan) as he reports he would prefer not to receive Ativan again.   Outreach to Gastroenterology [facility] RN CM and Bryan [name] had an unsuccessful outreach to Dr Sydell Axon office and left a message for the nurse to return a call to Bryan [name] to allow him to ask questions about his abdominal MRI results Bryan [name] reports having an endoscopy completed with his recent colonoscopy He is continuing to have GI symptoms but is taking his prescribed medicine   pulmonology office follow up visit With review of his upcoming appointments Bryan [name] reports not being aware that he has an appointment on 07/21/20 at [place] at [name] office in [place]. He reports not recalling a call from the pulmonologist RN on 07/17/20 and he may have been "out of it" related to the Ativan. [facility] RN CM reviewed with him that it is noted he was contacted, ONO (over night Observation)  reviewed, questions answered and agreed to an appointment. Bryan tejay hubert RN CM for making him aware.   [facility] RN CM and Bryan [name] had an unsuccessful outreach to have the pulmonology RN discuss his ONO results. Bryan [name] reports he will be able to go to the appointment 07/21/20 at [place] prior to his cardiac rehab session.  [facility]  RN CM outreach later and spoke with Bryan [name] to have a message sent to Bryan [name] to request a return call to Bryan [name] as he had been having side effects from [facility] on 07/17/20 and reports not recalling speaking with her.      COPD  "Not any better" He continues to use albuterol nebulizer during the mornings and follow his action plan He continues to use his oxygen 2.5 L  Past Medical History:  Diagnosis Date  . Abdominal pain   . Acid reflux   . Arthritis   . Asthma   . Chest pain    . Chronic pain    with leg and back pain (disc problem)  . COPD (chronic obstructive pulmonary disease) ([place])   . Diabetes mellitus without complication ([place])   . Epigastric pain   . Headache    HX OF  . History of upper GI x-ray series    to follow showed large duodenal ulcer H pylori serologies were negative  . Hypertension   . NSTEMI (non-ST elevated myocardial infarction) ([place]) 11/2019  . Pneumonia    07/17/17  . Pre-diabetes   . Stroke [facility])    TIA MINI STROKE  . Tubular adenoma      [facility] RN CM will follow up with Bryan Posthumus with in the next 14-21 business days  Pt encouraged to return a call to [facility] RN CM prn   [name], RN, BSN, [place] Coordinator Office number [phone] [facility] number [phone] Fax number [phone]

## 2020-07-20 NOTE — Telephone Encounter (Signed)
Called patient and went over ONO results per [name]. All questions answered and patient expressed understanding and confirmed that he will be coming to scheduled appointment tomorrow (07/21/2020) at 8:45am, with [name] at the [place] office. Confirmed with patient address too. Nothing further needed at this time.

## 2020-07-21 ENCOUNTER — Encounter: Payer: Self-pay | Admitting: Pulmonary Disease

## 2020-07-21 ENCOUNTER — Encounter (HOSPITAL_COMMUNITY): Payer: Medicare Other

## 2020-07-21 ENCOUNTER — Ambulatory Visit (INDEPENDENT_AMBULATORY_CARE_PROVIDER_SITE_OTHER): Payer: Medicare Other | Admitting: Pulmonary Disease

## 2020-07-21 VITALS — BP 114/72 | HR 85 | Temp 97.5°F | Ht 63.0 in | Wt 118.8 lb

## 2020-07-21 DIAGNOSIS — R0683 Snoring: Secondary | ICD-10-CM

## 2020-07-21 DIAGNOSIS — J449 Chronic obstructive pulmonary disease, unspecified: Secondary | ICD-10-CM | POA: Diagnosis not present

## 2020-07-21 DIAGNOSIS — I272 Pulmonary hypertension, unspecified: Secondary | ICD-10-CM

## 2020-07-21 DIAGNOSIS — J9611 Chronic respiratory failure with hypoxia: Secondary | ICD-10-CM | POA: Diagnosis not present

## 2020-07-21 NOTE — Progress Notes (Signed)
Bryan [name], Critical Care, and Sleep Medicine  Chief Complaint  Patient presents with  . Follow-up    shortness of breath at rest and with exertion    Constitutional:  BP 114/72 (BP Location: Left Arm, Cuff Size: Normal)   Pulse 85   Temp (!) 97.5 F (36.4 C) (Other (Comment)) Comment (Src): wrist  Ht _0  (1.6 m)   Wt 118 lb 12.8 oz (53.9 kg)   SpO2 97% Comment: 2L O2  BMI 21.04 kg/m   Past Medical History:  Chronic back and abdominal pain, CAD, CKD 3a, diastolic CHF, HTN, DM type 2, TIA  Past Surgical History:  His  has a past surgical history that includes Esophagogastroduodenoscopy (1/06); Neck surgery (10/2007); Colonoscopy (Feb 2012); Colonoscopy with esophagogastroduodenoscopy (egd) (N/A, 11/18/2013); Colonoscopy with propofol (N/A, 05/25/2017); Esophagogastroduodenoscopy (egd) with propofol (N/A, 05/25/2017); maloney dilation (N/A, 05/25/2017); polypectomy (05/25/2017); Back surgery (7/05; 5/09 ); Back surgery; Transurethral resection of prostate (N/A, 08/17/2017); Esophagogastroduodenoscopy (egd) with propofol (Left, 11/30/2019); RIGHT/LEFT HEART CATH AND CORONARY ANGIOGRAPHY (N/A, 12/03/2019); Colonoscopy with propofol (N/A, 04/16/2020); and Esophagogastroduodenoscopy (egd) with propofol (N/A, 04/16/2020).  Brief Summary:  KIMBERLY COYE is a 64 y.o. male former smoker with COPD and chronic hypoxic/hypercapnic respiratory failure.      Subjective:   He had overnight oximetry.  Showed low oxygen even though he was wearing 2 liters.  Oxygen desaturation pattern suggestive sleep apnea.    He is a restless sleeper and snores.  Feels sleepy during the day.  He was seen by cardiology.  Concern for atypical chest pain from GI system and referred to GI.  Physical Exam:   Appearance - wearing oxygen  ENMT - no sinus tenderness, no oral exudate, no LAN, Mallampati 3 airway, no stridor  Respiratory - decreased breath sounds bilaterally, no wheezing or rales  CV - s1s2  regular rate and rhythm, no murmurs  Ext - no clubbing, no edema  Skin - no rashes  Psych - normal mood and affect  [name] testing:   Spirometry 09/25/15 >> FEV1 0.5 (21%), FEV1% 45  ABG on RA 09/16/18 >> pH 7.40, PCO2 51.1, PO2 48  Chest Imaging:   CT angio chest 11/23/19 >> centrilobular emphysema  Sleep Tests:   ONO with 2 liters 07/07/20 >> test time 11 hrs 26 min.  Baseline SpO2 85%, low SpO2 61%.  Spent 9 hrs 43 min with SpO2 < 88%.  Desaturation pattern suggestive of sleep apnea.  Cardiac Tests:   Echo 11/24/19 >> EF 55 to 60%, grade 1 DD, RVSP 60.5 mmHg  RHC/LHC 12/03/19 >> non obstructive CAD, PCW nl, LVEDP nl, WHO group 3 [name] HTN  Echo 06/24/20 >> EF 55 to 60%, mild LVH, severe RV dilation, RVSP 100.2 mmHg, mod RA dilation, mod/severe TR  Social History:  He  reports that he quit smoking about 5 years ago. His smoking use included cigarettes. He has a 20.00 pack-year smoking history. He has never used smokeless tobacco. He reports previous drug use. Drug: Marijuana. He reports that he does not drink alcohol.  Family History:  His family history includes Asthma in his mother; Cancer in his father.     Assessment/Plan:   Snoring. - discussed findings from his overnight oximetry - concerned he could have obstructive sleep apnea - he needs to have sleep study to further assess - given history of COPD with chronic hypoxic respiratory failure, in my medical opinion he needs to have sleep study performed in sleep lab and not home  sleep study  COPD with emphysema. - continue trelegy - prn albuterol - enrolled in [name] rehab  Chronic hypoxic/hypercapnic respiratory failure. - goal SpO2 > 90% - continue 2 liters oxygen 24/7  WHO group 3 [name] hypertension. - 2nd to COPD and hypoxia - continue supplemental oxygen and f/u sleep study  Atypical chest pain. - seen by cardiology and felt not to be cardiac in origin - could be GI related >> he has  appointment upcoming with GI - could be related to [name] hypertension >> will continue to optimize management of this   Time Spent Involved in Patient Care on Day of Examination:  22 minutes  Follow up:  Patient Instructions  Will arrange for in lab sleep study   Follow up in 4 months   Medication List:   Allergies as of 07/21/2020   No Known Allergies     Medication List       Accurate as of July 21, 2020  9:12 AM. If you have any questions, ask your nurse or doctor.        STOP taking these medications   LORazepam 2 MG tablet Commonly known as: Ativan Stopped by: Chesley Mires, MD   sucralfate 1 GM/10ML suspension Commonly known as: Carafate Stopped by: Chesley Mires, MD     TAKE these medications   acetaminophen 325 MG tablet Commonly known as: TYLENOL Take 2 tablets (650 mg total) by mouth every 6 (six) hours as needed for mild pain, fever or headache (or Fever >/= 101).   albuterol 108 (90 Base) MCG/ACT inhaler Commonly known as: VENTOLIN HFA Inhale 2 puffs into the lungs every 4 (four) hours as needed for wheezing or shortness of breath. Do not use with nebulizer   albuterol (2.5 MG/3ML) 0.083% nebulizer solution Commonly known as: PROVENTIL Take 3 mLs (2.5 mg total) by nebulization every 4 (four) hours as needed for wheezing or shortness of breath.   calcium carbonate 500 MG chewable tablet Commonly known as: TUMS - dosed in mg elemental calcium Chew 2 tablets (400 mg of elemental calcium total) by mouth 3 (three) times daily as needed for indigestion or heartburn.   gabapentin 300 MG capsule Commonly known as: NEURONTIN Take 1 capsule (300 mg total) by mouth 2 (two) times daily. Pt. Says he is taking twice daily.   guaiFENesin 600 MG 12 hr tablet Commonly known as: Mucinex Take 1 tablet (600 mg total) by mouth 2 (two) times daily.   linaclotide 72 MCG capsule Commonly known as: Linzess Take 1 capsule (72 mcg total) by mouth daily before  breakfast.   magnesium oxide 400 MG tablet Commonly known as: MAG-OX Take 400 mg by mouth daily.   methocarbamol 500 MG tablet Commonly known as: ROBAXIN Take 1 tablet (500 mg total) by mouth every 8 (eight) hours as needed for muscle spasms.   metoprolol tartrate 25 MG tablet Commonly known as: LOPRESSOR Take 0.5 tablets (12.5 mg total) by mouth 2 (two) times daily.   nitroGLYCERIN 0.4 MG SL tablet Commonly known as: NITROSTAT Place 1 tablet (0.4 mg total) under the tongue every 5 (five) minutes as needed for chest pain.   OXYGEN Inhale 2.5 L into the lungs daily.   pantoprazole 40 MG tablet Commonly known as: PROTONIX Take 1 tablet (40 mg total) by mouth 2 (two) times daily before a meal.   rosuvastatin 10 MG tablet Commonly known as: CRESTOR Take 1 tablet (10 mg total) by mouth daily.   torsemide 20 MG tablet Commonly  known as: Demadex Take 1 tablet (20 mg total) by mouth 2 (two) times daily. For Fluid/Heart   Trelegy Ellipta 100-62.5-25 MCG/INH Aepb Generic drug: Fluticasone-Umeclidin-Vilant Inhale 1 puff into the lungs daily.       Signature:  Chesley Mires, MD [place] Pager - [phone] 07/21/2020, 9:12 AM

## 2020-07-21 NOTE — Patient Instructions (Signed)
Will arrange for in lab sleep study   Follow up in 4 months

## 2020-07-23 ENCOUNTER — Encounter (HOSPITAL_COMMUNITY): Payer: Medicare Other

## 2020-07-23 NOTE — Progress Notes (Signed)
Referring Provider: Wynonia Hazard, NP Primary Care Physician:  Wynonia Hazard, NP Primary GI Physician: Dr. Gala Romney  Chief Complaint  Patient presents with  . Abdominal Pain    ongoing, all the times, sharp pain, across mid abd. no nausea, no vomiting, no diarrhea, no constipation    HPI:   Bryan [name] is a 64 y.o. male presenting today for follow-up of chronic abdominal pain and GERD.  History of GERD, PUD, severe grade D reflux esophagitis, dysphagia despite empiric dilation in Sept 2018, BPE without motility issues, constipation, adenomatous colon polyps.  Also with mild thickening of the mid to distal sigmoid colon noted on CT in May and June 2021.  Colonoscopy in August 2021 with 5 mm tubular adenoma in the rectum, otherwise normal exam.  Due for repeat colonoscopy in 2028.  EGD August 2021 with normal esophagus, normal stomach, patent pylorus, normal examined duodenum, previously noted PUD and reflux esophagitis healed.  He has had multiple CTs, ultrasound in the distant past, and HIDA in 2019 with gallbladder EF 92%.  He was last seen in our office for evaluation of abdominal pain and GERD in September 2021.  At that time, he reported abdominal pain for at least a year, bandlike in the upper abdomen, sometimes precipitated and persists after a regular bowel movement.  No association with eating.  Also states he could walk 50 feet and has abdominal pain that improves when he stops walking.  GERD was well controlled with Protonix daily.  Bowels moving well with Linzess 72.  Plan for CTA and continue current medications.  Due to renal function, CTA was canceled and MRA scheduled.  MRA 07/17/2020: IMPRESSION: VASCULAR  1. There is a moderate stenosis of the proximal superior mesenteric artery. It would be unlikely for this isolated lesion to result in symptoms of chronic mesenteric ischemia. 2. Widely patent celiac and inferior mesenteric arteries. 3. Focal moderate stenosis  of the right common iliac artery. 4. Significant stenoses of the origins of the bilateral internal iliac arteries. 5. Penetrating atherosclerotic ulcer versus non flow limiting dissection in the patulous left common femoral artery. Query prior surgical history of left common femoral endarterectomy?  NON VASCULAR  1. Nonspecific mucosal thickening along the greater curvature of the stomach. Findings are most consistent with gastritis. However, considerably less likely considerations include gastric lymphoma or adenocarcinoma.   Dr. Gala Romney stated he may need to see vascular surgery.  Also stated thickening of the stomach on MRI even with recent EGD being negative was somewhat bothersome.  Recommended follow-up in the office.  Stated he may need further GI evaluation.  Today: Continues with daily abdominal pain. Comes and goes. Cramping after a BMs or after urinating. May also start when he just sitting down. Also sharp. Last 15-20 minutes and resolves. Will come back later in the day. PCP gave him Bentyl yesterday, 20 mg QID. Took medication once yesterday. Didn't notice any change in his abdominal pain. Making him dizzy. Also reports pain after eating as well. Taking tylenol at home. Helps some. Pain with walking within a couple of minutes that improves with rest. No nausea or vomiting. Eating well. No early satiety. BMs daily with Linzess 72 mcg. No blood in the stool or black stools.   GERD symptoms every other day with Protonix BID. This has been going on for the last 2-3 months. Has been on Nexium and prevacid in the past. Never on Dexilant.   No NSAIDs.  Past Medical  History:  Diagnosis Date  . Abdominal pain   . Acid reflux   . Arthritis   . Asthma   . Chest pain   . Chronic pain    with leg and back pain (disc problem)  . COPD (chronic obstructive pulmonary disease) ([place])   . Diabetes mellitus without complication ([place])   . Epigastric pain   . Headache    HX OF  . History  of upper GI x-ray series    to follow showed large duodenal ulcer H pylori serologies were negative  . Hypertension   . NSTEMI (non-ST elevated myocardial infarction) ([place]) 11/2019  . Pneumonia    07/17/17  . Pre-diabetes   . Stroke [facility])    TIA MINI STROKE  . Tubular adenoma     Past Surgical History:  Procedure Laterality Date  . BACK SURGERY  7/05; 5/09    Dr.Hirsch,3 lumbar  X3  . BACK SURGERY    . COLONOSCOPY  Feb 2012   Dr. Gala Romney: normal rectum, pedunculate polyp removed but not recovered  . COLONOSCOPY WITH ESOPHAGOGASTRODUODENOSCOPY (EGD) N/A 11/18/2013   Dr.Rourk- tcs= normal rectum, multipe polyps about the ileocecal valve and distal transverse colon o/w the remainder of the colonic mucosa appeared normal bx= tubular adenoma. EGD= normal esophagus, stomach with scattered erosions mottling, friablility, no ulcer or infiltrating process patent pylorus bx= chronic inflammation. next TCS 11/2016  . COLONOSCOPY WITH PROPOFOL N/A 05/25/2017   Dr. Gala Romney: sigmoid diverticulosis, one 4 mm hyerplastic rectal polyp, ascending colonic AVMs surveillance 2023  . COLONOSCOPY WITH PROPOFOL N/A 04/16/2020   Procedure: COLONOSCOPY WITH PROPOFOL;  Surgeon: Daneil Dolin, MD; 5 mm tubular adenoma in the rectum, otherwise normal exam.  . ESOPHAGOGASTRODUODENOSCOPY  1/06   Dr. Volney American esophageal erosions,U-shaped stomach,marked erosions and edema of the bulb without discrete ulcer disease.   . ESOPHAGOGASTRODUODENOSCOPY (EGD) WITH PROPOFOL N/A 05/25/2017   Dr. Gala Romney: reflux esophagitis s/p empiric dilation, normal stomach and duodenum  . ESOPHAGOGASTRODUODENOSCOPY (EGD) WITH PROPOFOL Left 11/30/2019   LA Grade D esophagitis, small hiatal hernia, one non-bleeding cratered gastric ulcer in prepyloric region of stomach, many non-bleeding cratered duodenal ulcers without stigmata of bleeding, mild luminal narrowing at apex of duodenal bulb but traversed easily. H.pylori serology negative.  .  ESOPHAGOGASTRODUODENOSCOPY (EGD) WITH PROPOFOL N/A 04/16/2020   Procedure: ESOPHAGOGASTRODUODENOSCOPY (EGD) WITH PROPOFOL;  Surgeon: Daneil Dolin, MD; normal esophagus, normal stomach, patent pylorus, normal examined duodenum, previously noted PUD and reflux esophagitis healed.   Marland Kitchen MALONEY DILATION N/A 05/25/2017   Procedure: Venia Minks DILATION;  Surgeon: Daneil Dolin, MD;  Location: [facility];  Service: Endoscopy;  Laterality: N/A;  . NECK SURGERY  10/2007   PLATE IN NECK  . POLYPECTOMY  05/25/2017   Procedure: POLYPECTOMY;  Surgeon: Daneil Dolin, MD;  Location: [facility];  Service: Endoscopy;;  rectal  . RIGHT/LEFT HEART CATH AND CORONARY ANGIOGRAPHY N/A 12/03/2019   Procedure: RIGHT/LEFT HEART CATH AND CORONARY ANGIOGRAPHY;  Surgeon: Nigel Mormon, MD;  Location: [facility];  Service: Cardiovascular;  Laterality: N/A;  . TRANSURETHRAL RESECTION OF PROSTATE N/A 08/17/2017   Procedure: TRANSURETHRAL RESECTION OF THE PROSTATE (TURP);  Surgeon: Irine Seal, MD;  Location: [facility];  Service: Urology;  Laterality: N/A;    Current Outpatient Medications  Medication Sig Dispense Refill  . acetaminophen (TYLENOL) 325 MG tablet Take 2 tablets (650 mg total) by mouth every 6 (six) hours as needed for mild pain, fever or headache (or Fever >/= 101).  12 tablet 0  . albuterol (PROVENTIL) (2.5 MG/3ML) 0.083% nebulizer solution Take 3 mLs (2.5 mg total) by nebulization every 4 (four) hours as needed for wheezing or shortness of breath. 75 mL 5  . albuterol (VENTOLIN HFA) 108 (90 Base) MCG/ACT inhaler Inhale 2 puffs into the lungs every 4 (four) hours as needed for wheezing or shortness of breath. Do not use with nebulizer 18 g 2  . calcium carbonate (TUMS - DOSED IN MG ELEMENTAL CALCIUM) 500 MG chewable tablet Chew 2 tablets (400 mg of elemental calcium total) by mouth 3 (three) times daily as needed for indigestion or heartburn. 30 tablet 0  . Fluticasone-Umeclidin-Vilant (TRELEGY  ELLIPTA) 100-62.5-25 MCG/INH AEPB Inhale 1 puff into the lungs daily. 28 each 5  . gabapentin (NEURONTIN) 300 MG capsule Take 1 capsule (300 mg total) by mouth 2 (two) times daily. Pt. Says he is taking twice daily.    Marland Kitchen guaiFENesin (MUCINEX) 600 MG 12 hr tablet Take 1 tablet (600 mg total) by mouth 2 (two) times daily. 60 tablet 2  . linaclotide (LINZESS) 72 MCG capsule Take 1 capsule (72 mcg total) by mouth daily before breakfast. (Patient taking differently: Take 72 mcg by mouth daily before breakfast. As needed) 90 capsule 3  . magnesium oxide (MAG-OX) 400 MG tablet Take 400 mg by mouth daily.    . methocarbamol (ROBAXIN) 500 MG tablet Take 1 tablet (500 mg total) by mouth every 8 (eight) hours as needed for muscle spasms. 30 tablet 0  . metoprolol tartrate (LOPRESSOR) 25 MG tablet Take 0.5 tablets (12.5 mg total) by mouth 2 (two) times daily. 30 tablet 1  . nitroGLYCERIN (NITROSTAT) 0.4 MG SL tablet Place 1 tablet (0.4 mg total) under the tongue every 5 (five) minutes as needed for chest pain. 30 tablet 0  . OXYGEN Inhale 2.5 L into the lungs daily.    . rosuvastatin (CRESTOR) 10 MG tablet Take 1 tablet (10 mg total) by mouth daily. 90 tablet 3  . torsemide (DEMADEX) 20 MG tablet Take 1 tablet (20 mg total) by mouth 2 (two) times daily. For Fluid/Heart 60 tablet 2  . dexlansoprazole (DEXILANT) 60 MG capsule Take 1 capsule (60 mg total) by mouth daily. 30 capsule 3  . hyoscyamine (LEVSIN) 0.125 MG tablet Take 1 tablet (0.125 mg total) by mouth every 4 (four) hours as needed. 120 tablet 1   No current facility-administered medications for this visit.    Allergies as of 08/05/2020  . (No Known Allergies)    Family History  Problem Relation Age of Onset  . Cancer Father   . Asthma Mother   . Colon cancer Neg Hx     Social History   Socioeconomic History  . Marital status: Single    Spouse name: Not on file  . Number of children: 2  . Years of education: 37  . Highest education  level: High school graduate  Occupational History  . Occupation: disabled    Fish farm manager: UNEMPLOYED  Tobacco Use  . Smoking status: Former Smoker    Packs/day: 0.50    Years: 40.00    Pack years: 20.00    Types: Cigarettes    Quit date: 11/11/2014    Years since quitting: 5.7  . Smokeless tobacco: Never Used  Vaping Use  . Vaping Use: Never used  Substance and Sexual Activity  . Alcohol use: No    Alcohol/week: 0.0 standard drinks  . Drug use: Not Currently    Types: Marijuana  Comment: occas; denied 01/28/20  . Sexual activity: Not Currently    Birth control/protection: None  Other Topics Concern  . Not on file  Social History Narrative   Lives with girlfriend and son, is on disability for history of back injuries.   Social Determinants of Health   Financial Resource Strain: Medium Risk  . Difficulty of Paying Living Expenses: Somewhat hard  Food Insecurity: No Food Insecurity  . Worried About Charity fundraiser in the Last Year: Never true  . Ran Out of Food in the Last Year: Never true  Transportation Needs: No Transportation Needs  . Lack of Transportation (Medical): No  . Lack of Transportation (Non-Medical): No  Physical Activity: Inactive  . Days of Exercise per Week: 0 days  . Minutes of Exercise per Session: 0 min  Stress: No Stress Concern Present  . Feeling of Stress : Not at all  Social Connections: Socially Isolated  . Frequency of Communication with Friends and Family: More than three times a week  . Frequency of Social Gatherings with Friends and Family: More than three times a week  . Attends Religious Services: Never  . Active Member of Clubs or Organizations: No  . Attends Archivist Meetings: Never  . Marital Status: Never married    Review of Systems: Gen: Denies fever, chills, cold or flulike symptoms, presyncope, syncope. CV: Has chest pain on the right side frequently. Has been evaluated by cardiology. May occur with abdominal pain  or not.  Has peripheral edema at times. Resp: Chronic shortness of breath.  On 2 L supplemental oxygen via nasal cannula.  No cough.   GI: See HPI. Heme: See HPI  Physical Exam: BP 91/66   Pulse 83   Temp 97.8 F (36.6 C)   Ht _0  (1.6 m)   Wt 121 lb 6.4 oz (55.1 kg)   BMI 21.51 kg/m  General:   Alert and oriented. No distress noted. Pleasant and cooperative. Nasal canula in place.  Had to raise head of exam table up about 30 degrees for patient to be comfortable during exam. Notes SOB when laying flat which is chronic.  Head:  Normocephalic and atraumatic. Heart:  S1, S2 present without murmurs appreciated. Lungs:  Few crackles heard in the lung bases. No wheezes or rhonchi. No distress.  Abdomen:  +BS, soft, and non-distended. Moderate TTP greatest in the epigastric area. No rebound or guarding. No HSM or masses noted. Msk:  Symmetrical without gross deformities. Normal posture. Extremities:  Without edema. Neurologic:  Alert and  oriented x4 Psych: Normal mood and affect.

## 2020-07-24 ENCOUNTER — Encounter: Payer: Self-pay | Admitting: Gastroenterology

## 2020-07-24 ENCOUNTER — Encounter (HOSPITAL_COMMUNITY): Payer: Self-pay

## 2020-07-24 ENCOUNTER — Other Ambulatory Visit: Payer: Self-pay

## 2020-07-24 ENCOUNTER — Inpatient Hospital Stay (HOSPITAL_COMMUNITY)
Admission: EM | Admit: 2020-07-24 | Discharge: 2020-08-12 | DRG: 682 | Disposition: E | Payer: Medicare Other | Attending: Internal Medicine | Admitting: Internal Medicine

## 2020-07-24 ENCOUNTER — Emergency Department (HOSPITAL_COMMUNITY): Payer: Medicare Other

## 2020-07-24 ENCOUNTER — Ambulatory Visit (INDEPENDENT_AMBULATORY_CARE_PROVIDER_SITE_OTHER): Payer: Medicare Other | Admitting: Gastroenterology

## 2020-07-24 VITALS — BP 91/66 | HR 83 | Temp 97.8°F | Ht 63.0 in | Wt 121.4 lb

## 2020-07-24 DIAGNOSIS — R001 Bradycardia, unspecified: Secondary | ICD-10-CM | POA: Diagnosis not present

## 2020-07-24 DIAGNOSIS — I129 Hypertensive chronic kidney disease with stage 1 through stage 4 chronic kidney disease, or unspecified chronic kidney disease: Secondary | ICD-10-CM | POA: Diagnosis present

## 2020-07-24 DIAGNOSIS — N179 Acute kidney failure, unspecified: Secondary | ICD-10-CM | POA: Diagnosis not present

## 2020-07-24 DIAGNOSIS — G8929 Other chronic pain: Secondary | ICD-10-CM

## 2020-07-24 DIAGNOSIS — E872 Acidosis: Secondary | ICD-10-CM | POA: Diagnosis present

## 2020-07-24 DIAGNOSIS — I471 Supraventricular tachycardia: Secondary | ICD-10-CM | POA: Diagnosis not present

## 2020-07-24 DIAGNOSIS — N170 Acute kidney failure with tubular necrosis: Secondary | ICD-10-CM | POA: Diagnosis not present

## 2020-07-24 DIAGNOSIS — R519 Headache, unspecified: Secondary | ICD-10-CM | POA: Diagnosis present

## 2020-07-24 DIAGNOSIS — R578 Other shock: Secondary | ICD-10-CM

## 2020-07-24 DIAGNOSIS — R404 Transient alteration of awareness: Secondary | ICD-10-CM | POA: Diagnosis not present

## 2020-07-24 DIAGNOSIS — Z66 Do not resuscitate: Secondary | ICD-10-CM | POA: Diagnosis present

## 2020-07-24 DIAGNOSIS — D631 Anemia in chronic kidney disease: Secondary | ICD-10-CM | POA: Diagnosis present

## 2020-07-24 DIAGNOSIS — Z8673 Personal history of transient ischemic attack (TIA), and cerebral infarction without residual deficits: Secondary | ICD-10-CM

## 2020-07-24 DIAGNOSIS — R9389 Abnormal findings on diagnostic imaging of other specified body structures: Secondary | ICD-10-CM

## 2020-07-24 DIAGNOSIS — Z7951 Long term (current) use of inhaled steroids: Secondary | ICD-10-CM

## 2020-07-24 DIAGNOSIS — J9622 Acute and chronic respiratory failure with hypercapnia: Secondary | ICD-10-CM | POA: Diagnosis present

## 2020-07-24 DIAGNOSIS — I2510 Atherosclerotic heart disease of native coronary artery without angina pectoris: Secondary | ICD-10-CM | POA: Diagnosis present

## 2020-07-24 DIAGNOSIS — N1831 Chronic kidney disease, stage 3a: Secondary | ICD-10-CM | POA: Diagnosis present

## 2020-07-24 DIAGNOSIS — N183 Chronic kidney disease, stage 3 unspecified: Secondary | ICD-10-CM | POA: Diagnosis present

## 2020-07-24 DIAGNOSIS — E875 Hyperkalemia: Secondary | ICD-10-CM

## 2020-07-24 DIAGNOSIS — Z79899 Other long term (current) drug therapy: Secondary | ICD-10-CM

## 2020-07-24 DIAGNOSIS — Z20822 Contact with and (suspected) exposure to covid-19: Secondary | ICD-10-CM | POA: Diagnosis present

## 2020-07-24 DIAGNOSIS — Z2882 Immunization not carried out because of caregiver refusal: Secondary | ICD-10-CM

## 2020-07-24 DIAGNOSIS — R3589 Other polyuria: Secondary | ICD-10-CM | POA: Diagnosis present

## 2020-07-24 DIAGNOSIS — K219 Gastro-esophageal reflux disease without esophagitis: Secondary | ICD-10-CM

## 2020-07-24 DIAGNOSIS — I252 Old myocardial infarction: Secondary | ICD-10-CM

## 2020-07-24 DIAGNOSIS — Z825 Family history of asthma and other chronic lower respiratory diseases: Secondary | ICD-10-CM

## 2020-07-24 DIAGNOSIS — K2100 Gastro-esophageal reflux disease with esophagitis, without bleeding: Secondary | ICD-10-CM | POA: Diagnosis present

## 2020-07-24 DIAGNOSIS — E1169 Type 2 diabetes mellitus with other specified complication: Secondary | ICD-10-CM | POA: Diagnosis present

## 2020-07-24 DIAGNOSIS — J9611 Chronic respiratory failure with hypoxia: Secondary | ICD-10-CM | POA: Diagnosis present

## 2020-07-24 DIAGNOSIS — K5900 Constipation, unspecified: Secondary | ICD-10-CM

## 2020-07-24 DIAGNOSIS — E1122 Type 2 diabetes mellitus with diabetic chronic kidney disease: Secondary | ICD-10-CM | POA: Diagnosis present

## 2020-07-24 DIAGNOSIS — I130 Hypertensive heart and chronic kidney disease with heart failure and stage 1 through stage 4 chronic kidney disease, or unspecified chronic kidney disease: Secondary | ICD-10-CM | POA: Diagnosis present

## 2020-07-24 DIAGNOSIS — J9621 Acute and chronic respiratory failure with hypoxia: Secondary | ICD-10-CM | POA: Diagnosis present

## 2020-07-24 DIAGNOSIS — Z87891 Personal history of nicotine dependence: Secondary | ICD-10-CM

## 2020-07-24 DIAGNOSIS — M1990 Unspecified osteoarthritis, unspecified site: Secondary | ICD-10-CM | POA: Diagnosis present

## 2020-07-24 DIAGNOSIS — E785 Hyperlipidemia, unspecified: Secondary | ICD-10-CM | POA: Diagnosis present

## 2020-07-24 DIAGNOSIS — I2723 Pulmonary hypertension due to lung diseases and hypoxia: Secondary | ICD-10-CM | POA: Diagnosis present

## 2020-07-24 DIAGNOSIS — D509 Iron deficiency anemia, unspecified: Secondary | ICD-10-CM

## 2020-07-24 DIAGNOSIS — E871 Hypo-osmolality and hyponatremia: Secondary | ICD-10-CM | POA: Diagnosis present

## 2020-07-24 DIAGNOSIS — E861 Hypovolemia: Secondary | ICD-10-CM | POA: Diagnosis present

## 2020-07-24 DIAGNOSIS — R0682 Tachypnea, not elsewhere classified: Secondary | ICD-10-CM

## 2020-07-24 DIAGNOSIS — Z978 Presence of other specified devices: Secondary | ICD-10-CM

## 2020-07-24 DIAGNOSIS — C801 Malignant (primary) neoplasm, unspecified: Secondary | ICD-10-CM | POA: Diagnosis present

## 2020-07-24 DIAGNOSIS — Z515 Encounter for palliative care: Secondary | ICD-10-CM

## 2020-07-24 DIAGNOSIS — R109 Unspecified abdominal pain: Secondary | ICD-10-CM

## 2020-07-24 DIAGNOSIS — Z9981 Dependence on supplemental oxygen: Secondary | ICD-10-CM

## 2020-07-24 DIAGNOSIS — Z8701 Personal history of pneumonia (recurrent): Secondary | ICD-10-CM

## 2020-07-24 DIAGNOSIS — J439 Emphysema, unspecified: Secondary | ICD-10-CM | POA: Diagnosis present

## 2020-07-24 DIAGNOSIS — E1142 Type 2 diabetes mellitus with diabetic polyneuropathy: Secondary | ICD-10-CM

## 2020-07-24 DIAGNOSIS — Z8711 Personal history of peptic ulcer disease: Secondary | ICD-10-CM

## 2020-07-24 DIAGNOSIS — E874 Mixed disorder of acid-base balance: Secondary | ICD-10-CM | POA: Diagnosis not present

## 2020-07-24 DIAGNOSIS — Z7189 Other specified counseling: Secondary | ICD-10-CM

## 2020-07-24 DIAGNOSIS — G4730 Sleep apnea, unspecified: Secondary | ICD-10-CM | POA: Diagnosis present

## 2020-07-24 DIAGNOSIS — I5032 Chronic diastolic (congestive) heart failure: Secondary | ICD-10-CM | POA: Diagnosis present

## 2020-07-24 DIAGNOSIS — G9341 Metabolic encephalopathy: Secondary | ICD-10-CM | POA: Diagnosis present

## 2020-07-24 DIAGNOSIS — Z23 Encounter for immunization: Secondary | ICD-10-CM

## 2020-07-24 DIAGNOSIS — R809 Proteinuria, unspecified: Secondary | ICD-10-CM | POA: Diagnosis present

## 2020-07-24 DIAGNOSIS — E782 Mixed hyperlipidemia: Secondary | ICD-10-CM | POA: Diagnosis present

## 2020-07-24 DIAGNOSIS — J441 Chronic obstructive pulmonary disease with (acute) exacerbation: Secondary | ICD-10-CM

## 2020-07-24 DIAGNOSIS — J449 Chronic obstructive pulmonary disease, unspecified: Secondary | ICD-10-CM | POA: Diagnosis present

## 2020-07-24 LAB — RESPIRATORY PANEL BY RT PCR (FLU A&B, COVID)
Influenza A by PCR: NEGATIVE
Influenza B by PCR: NEGATIVE
SARS Coronavirus 2 by RT PCR: NEGATIVE

## 2020-07-24 MED ORDER — MORPHINE SULFATE (PF) 4 MG/ML IV SOLN
4.0000 mg | Freq: Once | INTRAVENOUS | Status: AC
  Administered 2020-07-24: 4 mg via INTRAVENOUS
  Filled 2020-07-24: qty 1

## 2020-07-24 MED ORDER — DEXILANT 60 MG PO CPDR: 60.0000 mg | DELAYED_RELEASE_CAPSULE | Freq: Every day | ORAL | 3 refills | Status: AC

## 2020-07-24 MED ORDER — HYOSCYAMINE SULFATE 0.125 MG PO TABS: 0.1250 mg | ORAL_TABLET | ORAL | 1 refills | Status: AC | PRN

## 2020-07-24 MED ORDER — ONDANSETRON HCL 4 MG/2ML IJ SOLN
4.0000 mg | Freq: Once | INTRAMUSCULAR | Status: AC
  Administered 2020-07-24: 4 mg via INTRAVENOUS
  Filled 2020-07-24: qty 2

## 2020-07-24 NOTE — Patient Instructions (Signed)
PA for EGD submitted via [facility] website. PA# [id number], valid 08/24/20-09/11/20.

## 2020-07-24 NOTE — Assessment & Plan Note (Deleted)
Addressed under chronic abdominal pain.

## 2020-07-24 NOTE — Assessment & Plan Note (Signed)
Chronic. Not adequately controlled on Protonix 40 mg BID. Will try Dexilant 60 mg daily. Samples provided and Rx sent to pharmacy. Counseled on GERD diet/lifestyle and handout provided.

## 2020-07-24 NOTE — Assessment & Plan Note (Signed)
Addressed under chronic abdominal pain. 

## 2020-07-24 NOTE — Assessment & Plan Note (Signed)
Chronic.  Well-controlled on Linzess 72 mcg daily.  Advised he continue current medications.

## 2020-07-24 NOTE — ED Triage Notes (Signed)
Pt to er room number 4 via ems, per ems pt is here for sob and abd pain, pt has indwelling 20 in his L hand, pt on 2.5 liters via [place] at home all the time, pt states that he has a gastro doc, states that he has had the abd pain for the past 11 months, states that his sob was worse today, states that he has copd.

## 2020-07-24 NOTE — Assessment & Plan Note (Addendum)
Several year history of upper abdominal pain continues.  Pain comes and goes multiple times throughout the day and can be triggered by eating, bowel movements although bowels are moving well with Linzess 72 mcg daily, urinating, or with activity. Etiology is not clear. Extensive evaluation including multiple CT scans, ultrasound in the distant past, HIDA in 2019 with gallbladder EF 92%, EGD August 2021 with normal exam, colonoscopy in August 2021 with 5 mm tubular adenoma in the rectum, otherwise normal exam, and most recently with MR angiography with moderate stenosis of proximal superior mesenteric artery but patent celiac and inferior mesenteric arteries and therefore doubt chronic mesenteric ischemia.  There were some other abnormal vascular findings on MRI including significant stenosis of the origins of the bilateral internal iliac arteries and penetrating atherosclerotic ulcer versus nonflow-limiting dissection in the patulous left common femoral artery.  There was nonspecific mucosal thickening along the greater curvature of the stomach.  Stated findings were most consistent with gastritis; however, less likely considerations include gastric lymphoma or adenocarcinoma.  Dr. Gala Romney reviewed this and stated thickening of the stomach on MRI is somewhat bothersome.  Per patient, his pain continues to be the same over the years with no improvement and no worsening. Abdominal exam with moderate tenderness to palpation across the upper abdomen.  Suspect there may be some functional component to his abdominal pain. He does report uncontrolled GERD despite PPI BID which could be contributing. Query component of IBS. Not sure what to make of gastric abnormalities on MRI.  We will plan to re-evaluate this with EGD.    Plan: Proceed with EGD with propofol with Dr. Gala Romney in the near future.The risks, benefits, and alternatives have been discussed with the patient in detail. The patient states understanding and  desires to proceed.  ASA III Stop Protonix and trial Dexilant 60 mg daily. Stop Bentyl and try Levsin 0.125 mg 3 times daily before meals and at bedtime. Conitnue Linzess 72 mcg daily Refer to vascular surgery for further evaluation of vascular abnormalities on MRI. May need referral to tertiary care center for second opinion. Follow-up after EGD. Preferably with Dr. Gala Romney.

## 2020-07-24 NOTE — ED Provider Notes (Signed)
Emergency Department Provider Note   I have reviewed the triage vital signs and the nursing notes.   HISTORY  Chief Complaint Shortness of Breath, Chest Pain, and Abdominal Pain   HPI Bryan [name] is a 64 y.o. male with past medical history reviewed below presents to the emergency department with shortness of breath with chest discomfort and abdominal pain.  Patient arrives by EMS.  He states his major symptom this evening was shortness of breath.  He felt like his throat was "closing up" and could not catch his breath.  He tried an albuterol treatment with only mild relief in symptoms.  EMS arrived on scene and found patient satting well on his home oxygen of 2.5 L.  No additional treatments in route.  Patient is having some associated chest pain which she describes as "sharp" but also "burning."  The pain is in the middle to the right side of the chest.  He states this has been ongoing and a daily occurrence for at least the last month, likely longer.  Patient is also having some abdominal discomfort.  He states this has been ongoing for the past year.  He tells me he recently had an abdominal MRI with diagnosis at that time of gastritis. No fever/chills. No radiation of symptoms or modifying factors.    Past Medical History:  Diagnosis Date  . Abdominal pain   . Acid reflux   . Arthritis   . Asthma   . Chest pain   . Chronic pain    with leg and back pain (disc problem)  . COPD (chronic obstructive pulmonary disease) ([place])   . Diabetes mellitus without complication ([place])   . Diabetic peripheral neuropathy ([place]) 07/25/2020  . Epigastric pain   . Headache    HX OF  . History of upper GI x-ray series    to follow showed large duodenal ulcer H pylori serologies were negative  . Hypertension   . NSTEMI (non-ST elevated myocardial infarction) ([place]) 11/2019  . Pneumonia    07/17/17  . Pre-diabetes   . Stroke [facility])    TIA MINI STROKE  . Tubular adenoma     Patient Active  Problem List   Diagnosis Date Noted  . AKI (acute kidney injury) ([place]) 07/25/2020  . Diabetic peripheral neuropathy ([employer]) 07/25/2020  . Abnormal magnetic resonance angiography (MRA) 08/10/2020  . Acute on chronic diastolic congestive heart failure ([place]) 04/19/2020  . Acute on chronic respiratory failure with hypoxia ([place]) 02/14/2020  . Diverticulitis of colon   . Abnormal CT scan, colon   . RUQ pain   . Acute on chronic diastolic CHF (congestive heart failure) ([place]) 02/12/2020  . PUD (peptic ulcer disease) 01/28/2020  . Nonobstructive atherosclerosis of coronary artery   . Pulmonary hypertension due to COPD ([place])   . NSTEMI (non-ST elevated myocardial infarction) ([place]) 11/25/2019  . Non-insulin dependent type 2 diabetes mellitus ([place])   . Mixed hyperlipidemia   . Non-ST elevation (NSTEMI) myocardial infarction ([place]) 11/24/2019  . Oxygen dependent 11/24/2019  . Former smoker 11/24/2019  . Coronary artery calcification seen on CAT scan   . Angina pectoris ([place]) 11/23/2019  . SOB (shortness of breath) 11/23/2019  . Chronic diastolic CHF (congestive heart failure) ([place]) 08/03/2019  . Acute on chronic respiratory failure ([place]) 08/02/2019  . Acute exacerbation of CHF (congestive heart failure) /HFpEF (EF 60%) 06/13/2019  . Normocytic anemia 02/15/2019  . COPD exacerbation ([place]) 09/16/2018  . Hypokalemia 06/26/2018  . Hypomagnesemia 06/26/2018  .  Microcytic anemia 06/26/2018  . BPH (benign prostatic hyperplasia) 06/26/2018  . Chronic respiratory failure with hypoxia ([place]) 06/04/2018  . HCAP (healthcare-associated pneumonia) 06/04/2018  . Acute respiratory failure with hypoxia ([place]) 04/14/2018  . Pulmonary nodule 04/14/2018  . CKD (chronic kidney disease) stage 3, GFR 30-59 ml/min (HCC) 04/14/2018  . Type 2 diabetes mellitus with hyperlipidemia ([place]) 03/12/2018  . BPH with obstruction/lower urinary tract symptoms 08/17/2017  . Left sided numbness 07/22/2017  . TIA (transient ischemic  attack) 07/22/2017  . Pneumonia 07/14/2017  . COPD with acute exacerbation ([place]) 07/14/2017  . Constipation 07/10/2017  . Hx of adenomatous colonic polyps 05/01/2017  . Benign hypertension with CKD (chronic kidney disease) stage III ([place])   . Chronic pain   . Heme positive stool 10/20/2014  . Epigastric pain 06/19/2014  . Dysphagia 06/19/2014  . Chronic abdominal pain 11/13/2013  . Early satiety 11/13/2013  . COPD (chronic obstructive pulmonary disease) ([place]) 02/22/2013  . Low back pain 11/19/2012  . COLITIS 05/12/2009  . ABDOMINAL PAIN, LEFT LOWER QUADRANT 05/12/2009  . DUODENAL ULCER, HX OF 05/12/2009  . H/o Alcohol abuse- Quit in 2010 05/11/2009  . GERD 05/11/2009  . Diarrhea 05/11/2009  . SPONDYLOSIS, CERVICAL 07/03/2007  . NECK PAIN 07/03/2007    Past Surgical History:  Procedure Laterality Date  . BACK SURGERY  7/05; 5/09    Dr.Hirsch,3 lumbar  X3  . BACK SURGERY    . COLONOSCOPY  Feb 2012   Dr. Gala Romney: normal rectum, pedunculate polyp removed but not recovered  . COLONOSCOPY WITH ESOPHAGOGASTRODUODENOSCOPY (EGD) N/A 11/18/2013   Dr.Rourk- tcs= normal rectum, multipe polyps about the ileocecal valve and distal transverse colon o/w the remainder of the colonic mucosa appeared normal bx= tubular adenoma. EGD= normal esophagus, stomach with scattered erosions mottling, friablility, no ulcer or infiltrating process patent pylorus bx= chronic inflammation. next TCS 11/2016  . COLONOSCOPY WITH PROPOFOL N/A 05/25/2017   Dr. Gala Romney: sigmoid diverticulosis, one 4 mm hyerplastic rectal polyp, ascending colonic AVMs surveillance 2023  . COLONOSCOPY WITH PROPOFOL N/A 04/16/2020   Procedure: COLONOSCOPY WITH PROPOFOL;  Surgeon: Daneil Dolin, MD; 5 mm tubular adenoma in the rectum, otherwise normal exam.  . ESOPHAGOGASTRODUODENOSCOPY  1/06   Dr. Volney American esophageal erosions,U-shaped stomach,marked erosions and edema of the bulb without discrete ulcer disease.   .  ESOPHAGOGASTRODUODENOSCOPY (EGD) WITH PROPOFOL N/A 05/25/2017   Dr. Gala Romney: reflux esophagitis s/p empiric dilation, normal stomach and duodenum  . ESOPHAGOGASTRODUODENOSCOPY (EGD) WITH PROPOFOL Left 11/30/2019   LA Grade D esophagitis, small hiatal hernia, one non-bleeding cratered gastric ulcer in prepyloric region of stomach, many non-bleeding cratered duodenal ulcers without stigmata of bleeding, mild luminal narrowing at apex of duodenal bulb but traversed easily. H.pylori serology negative.  . ESOPHAGOGASTRODUODENOSCOPY (EGD) WITH PROPOFOL N/A 04/16/2020   Procedure: ESOPHAGOGASTRODUODENOSCOPY (EGD) WITH PROPOFOL;  Surgeon: Daneil Dolin, MD; normal esophagus, normal stomach, patent pylorus, normal examined duodenum, previously noted PUD and reflux esophagitis healed.   Marland Kitchen MALONEY DILATION N/A 05/25/2017   Procedure: Venia Minks DILATION;  Surgeon: Daneil Dolin, MD;  Location[facility];  Service: Endoscopy;  Laterality: N/A;  . NECK SURGERY  10/2007   PLATE IN NECK  . POLYPECTOMY  05/25/2017   Procedure: POLYPECTOMY;  Surgeon: Daneil Dolin, MD;  Location: [facility];  Service: Endoscopy;;  rectal  . RIGHT/LEFT HEART CATH AND CORONARY ANGIOGRAPHY N/A 12/03/2019   Procedure: RIGHT/LEFT HEART CATH AND CORONARY ANGIOGRAPHY;  Surgeon: Nigel Mormon, MD;  Location: [facility];  Service: Cardiovascular;  Laterality: N/A;  . TRANSURETHRAL RESECTION OF PROSTATE N/A 08/17/2017   Procedure: TRANSURETHRAL RESECTION OF THE PROSTATE (TURP);  Surgeon: Irine Seal, MD;  Location: [facility];  Service: Urology;  Laterality: N/A;    Allergies Patient has no known allergies.  Family History  Problem Relation Age of Onset  . Cancer Father   . Asthma Mother   . Colon cancer Neg Hx     Social History Social History   Tobacco Use  . Smoking status: Former Smoker    Packs/day: 0.50    Years: 40.00    Pack years: 20.00    Types: Cigarettes    Quit date: 11/11/2014    Years since  quitting: 5.7  . Smokeless tobacco: Never Used  Vaping Use  . Vaping Use: Never used  Substance Use Topics  . Alcohol use: No    Alcohol/week: 0.0 standard drinks  . Drug use: Not Currently    Review of Systems  Constitutional: No fever/chills Eyes: No visual changes. ENT: No sore throat. Cardiovascular: Positive chest pain. Respiratory: Positive shortness of breath. Gastrointestinal: Positive abdominal pain.  No nausea, no vomiting.  No diarrhea.  No constipation. Genitourinary: Negative for dysuria. Musculoskeletal: Negative for back pain. Skin: Negative for rash. Neurological: Negative for headaches, focal weakness or numbness.  10-point ROS otherwise negative.  ____________________________________________   PHYSICAL EXAM:  VITAL SIGNS: ED Triage Vitals  Enc Vitals Group     BP 07/29/2020 2139 114/80     Pulse Rate 07/31/2020 2139 75     Resp 07/30/2020 2139 (!) 24     Temp 07/26/2020 2139 (!) 97.5 F (36.4 C)     Temp Source 08/01/2020 2139 Oral     SpO2 07/29/2020 2139 98 %     Weight 07/18/2020 2137 119 lb (54 kg)     Height 07/20/2020 2137 _0  (1.6 m)   Constitutional: Alert and oriented. Well appearing and in no acute distress. Eyes: Conjunctivae are normal.  Head: Atraumatic. Nose: No congestion/rhinnorhea. Mouth/Throat: Mucous membranes are moist.  Neck: No stridor.   Cardiovascular: Normal rate, regular rhythm. Good peripheral circulation. Grossly normal heart sounds.   Respiratory: Normal respiratory effort. No retractions. Lungs with no severe wheezing. Slightly diminished at the bases.  Gastrointestinal: Soft and nontender. No distention.  Musculoskeletal: No lower extremity tenderness nor edema. No gross deformities of extremities. Neurologic:  Normal speech and language.  Skin:  Skin is warm, dry and intact. No rash noted.  ____________________________________________   LABS (all labs ordered are listed, but only abnormal results are displayed)  Labs  Reviewed  COMPREHENSIVE METABOLIC PANEL - Abnormal; Notable for the following components:      Result Value   Sodium 132 (*)    Chloride 96 (*)    Glucose, Bld 119 (*)    BUN 67 (*)    Creatinine, Ser 3.60 (*)    Calcium 8.2 (*)    GFR, Estimated 18 (*)    All other components within normal limits  LIPASE, BLOOD - Abnormal; Notable for the following components:   Lipase 61 (*)    All other components within normal limits  CBC WITH DIFFERENTIAL/PLATELET - Abnormal; Notable for the following components:   Hemoglobin 12.2 (*)    MCV 77.7 (*)    MCH 22.7 (*)    MCHC 29.2 (*)    RDW 18.8 (*)    nRBC 0.3 (*)    All other components within normal limits  BLOOD GAS,  VENOUS - Abnormal; Notable for the following components:   pH, Ven 7.150 (*)    pCO2, Ven 76.6 (*)    Bicarbonate 19.8 (*)    Acid-base deficit 2.3 (*)    All other components within normal limits  COMPREHENSIVE METABOLIC PANEL - Abnormal; Notable for the following components:   Sodium 132 (*)    Potassium 6.2 (*)    Chloride 97 (*)    CO2 21 (*)    BUN 69 (*)    Creatinine, Ser 3.56 (*)    Calcium 8.0 (*)    GFR, Estimated 18 (*)    All other components within normal limits  MAGNESIUM - Abnormal; Notable for the following components:   Magnesium 1.4 (*)    All other components within normal limits  CBC WITH DIFFERENTIAL/PLATELET - Abnormal; Notable for the following components:   Hemoglobin 11.5 (*)    MCV 79.3 (*)    MCH 22.2 (*)    MCHC 28.0 (*)    RDW 18.3 (*)    nRBC 0.3 (*)    All other components within normal limits  URINALYSIS, ROUTINE W REFLEX MICROSCOPIC - Abnormal; Notable for the following components:   APPearance HAZY (*)    Protein, ur 30 (*)    Bacteria, UA RARE (*)    All other components within normal limits  RESPIRATORY PANEL BY RT PCR (FLU A&B, COVID)  CREATININE, URINE, RANDOM  SODIUM, URINE, RANDOM  TROPONIN I (HIGH SENSITIVITY)  TROPONIN I (HIGH SENSITIVITY)    ____________________________________________  EKG   EKG Interpretation  Date/Time:  Friday July 24 2020 21:41:15 EST Ventricular Rate:  70 PR Interval:    QRS Duration: 86 QT Interval:  390 QTC Calculation: 421 R Axis:   95 Text Interpretation: Sinus rhythm Consider left atrial enlargement Right axis deviation Nonspecific ST changes. No significant change since last tracing No STEMI Confirmed by Nanda Quinton [phone]) on 08/11/2020 9:43:38 PM       ____________________________________________  RADIOLOGY  US RENAL  Result Date: 07/25/2020 CLINICAL DATA:  Acute kidney injury, abdominal pain EXAM: RENAL / URINARY TRACT ULTRASOUND COMPLETE COMPARISON:  Recent abdominal MRI 07/17/2020 FINDINGS: Right Kidney: Renal measurements: 9.2 x 4.4 x 5.2 cm = volume: 110 mL. The parenchyma is mildly echogenic. There is increased conspicuity between the corticomedullary interface. No mass or hydronephrosis visualized. Left Kidney: Renal measurements: 8.5 x 4.9 x 4.8 cm = volume: 105 mL. The parenchyma is mildly echogenic. There is increased conspicuity between the corticomedullary interface. No mass or hydronephrosis visualized. Bladder: Appears normal for degree of bladder distention. Other: None. IMPRESSION: 1. No evidence of hydronephrosis. 2. Mildly echogenic renal parenchyma suggests underlying medical renal disease. Electronically Signed   By: Jacqulynn Cadet M.D.   On: 07/25/2020 10:45   DG Chest Portable 1 View  Result Date: 07/22/2020 CLINICAL DATA:  Chest pain, shortness of breath EXAM: PORTABLE CHEST 1 VIEW COMPARISON:  06/23/2020 FINDINGS: Single frontal view of the chest demonstrates a stable cardiac silhouette. Background emphysema unchanged, without airspace disease, effusion, or pneumothorax. Continued prominence of the hilar vasculature may reflect underlying pulmonary arterial hypertension. IMPRESSION: 1. Emphysema, no acute intrathoracic process. Electronically Signed   By:  Randa Ngo M.D.   On: 07/25/2020 22:32    ____________________________________________   PROCEDURES  Procedure(s) performed:   Procedures  None ____________________________________________   INITIAL IMPRESSION / ASSESSMENT AND PLAN / ED COURSE  Pertinent labs & imaging results that were available during my care of the patient were reviewed  by me and considered in my medical decision making (see chart for details).   Patient presents to the ED with SOB symptoms and abdominal pain along with CP. Doubt dissection. Patient's abdominal pain and chest pain are chronic. SOB likely multifactorial. CXR reviewed. Labs pending. Care transferred to Dr. Roxanne Mins pending labs.   ____________________________________________  FINAL CLINICAL IMPRESSION(S) / ED DIAGNOSES  Final diagnoses:  Acute kidney injury (nontraumatic) (HCC)  Hyponatremia  Microcytic anemia  COPD exacerbation (HCC)  Chronic abdominal pain     MEDICATIONS GIVEN DURING THIS VISIT:  Medications  0.9 %  sodium chloride infusion ( Intravenous Rate/Dose Change 07/25/20 0955)  metoprolol tartrate (LOPRESSOR) tablet 12.5 mg (12.5 mg Oral Not Given 07/25/20 1008)  rosuvastatin (CRESTOR) tablet 10 mg (10 mg Oral Given 07/25/20 1007)  gabapentin (NEURONTIN) capsule 300 mg (300 mg Oral Given 07/25/20 1007)  methocarbamol (ROBAXIN) tablet 500 mg (has no administration in time range)  albuterol (PROVENTIL) (2.5 MG/3ML) 0.083% nebulizer solution 2.5 mg (has no administration in time range)  guaiFENesin (MUCINEX) 12 hr tablet 600 mg (600 mg Oral Given 07/25/20 1008)  heparin injection 5,000 Units (5,000 Units Subcutaneous Given 07/25/20 0537)  acetaminophen (TYLENOL) tablet 650 mg (has no administration in time range)    Or  acetaminophen (TYLENOL) suppository 650 mg (has no administration in time range)  oxyCODONE (Oxy IR/ROXICODONE) immediate release tablet 5 mg (5 mg Oral Given 07/25/20 1103)  morphine 2 MG/ML injection 2 mg  (has no administration in time range)  ondansetron (ZOFRAN) tablet 4 mg (has no administration in time range)    Or  ondansetron (ZOFRAN) injection 4 mg (has no administration in time range)  umeclidinium bromide (INCRUSE ELLIPTA) 62.5 MCG/INH 1 puff (1 puff Inhalation Given 07/25/20 0936)    And  fluticasone furoate-vilanterol (BREO ELLIPTA) 100-25 MCG/INH 1 puff (1 puff Inhalation Given 07/25/20 0936)  influenza vac split quadrivalent PF (FLUARIX) injection 0.5 mL (has no administration in time range)  morphine 4 MG/ML injection 4 mg (4 mg Intravenous Given 07/30/2020 2219)  ondansetron (ZOFRAN) injection 4 mg (4 mg Intravenous Given 07/27/2020 2219)  sodium chloride 0.9 % bolus 500 mL (0 mLs Intravenous Stopped 07/25/20 0503)  sodium zirconium cyclosilicate (LOKELMA) packet 5 g (5 g Oral Given 07/25/20 1103)  magnesium sulfate IVPB 2 g 50 mL (2 g Intravenous New Bag/Given 07/25/20 1106)    Note:  This document was prepared using Dragon voice recognition software and may include unintentional dictation errors.  Nanda Quinton, MD, [facility] Emergency Medicine    Anuar Walgren, [facility], MD 07/25/20 1239

## 2020-07-24 NOTE — Patient Instructions (Addendum)
We will get you scheduled for an upper endoscopy with Dr. Gala Romney in the near future to further evaluate the abnormality seen on recent MRI.  Please stop Protonix and trial Dexilant 60 mg daily.  Please call with a progress report in 2 weeks.  Please stop Bentyl and try Levsin 0.125 mg 3 times daily before meals and at bedtime.  Please let me know if this medication causes any dizziness.  Additional possible side effects include constipation, difficulty urinating, dry mouth and dry eyes.   Follow a GERD diet:  Avoid fried, fatty, greasy, spicy, citrus foods. Avoid caffeine and carbonated beverages. Avoid chocolate. Try eating 4-6 small meals a day rather than 3 large meals. Do not eat within 3 hours of laying down. Prop head of bed up on wood or bricks to create a 6 inch incline.  Continue Linzess 72 mcg daily.  Continue to avoid all NSAID products including ibuprofen, Aleve, Advil, Goody powders, aspirin powders, and anything that says "NSAID" on the package.  We will see you back after your upper endoscopy.   It was good to meet you today!  Aliene Altes, PA-C [facility]    Food Choices for Gastroesophageal Reflux Disease, Adult When you have gastroesophageal reflux disease (GERD), the foods you eat and your eating habits are very important. Choosing the right foods can help ease the discomfort of GERD. Consider working with a diet and nutrition specialist (dietitian) to help you make healthy food choices. What general guidelines should I follow?  Eating plan  Choose healthy foods low in fat, such as fruits, vegetables, whole grains, low-fat dairy products, and lean meat, fish, and poultry.  Eat frequent, small meals instead of three large meals each day. Eat your meals slowly, in a relaxed setting. Avoid bending over or lying down until 2-3 hours after eating.  Limit high-fat foods such as fatty meats or fried foods.  Limit your intake of oils, butter, and  shortening to less than 8 teaspoons each day.  Avoid the following: ? Foods that cause symptoms. These may be different for different people. Keep a food diary to keep track of foods that cause symptoms. ? Alcohol. ? Drinking large amounts of liquid with meals. ? Eating meals during the 2-3 hours before bed.  Cook foods using methods other than frying. This may include baking, grilling, or broiling. Lifestyle  Maintain a healthy weight. Ask your health care provider what weight is healthy for you. If you need to lose weight, work with your health care provider to do so safely.  Exercise for at least 30 minutes on 5 or more days each week, or as told by your health care provider.  Avoid wearing clothes that fit tightly around your waist and chest.  Do not use any products that contain nicotine or tobacco, such as cigarettes and e-cigarettes. If you need help quitting, ask your health care provider.  Sleep with the head of your bed raised. Use a wedge under the mattress or blocks under the bed frame to raise the head of the bed. What foods are not recommended? The items listed may not be a complete list. Talk with your dietitian about what dietary choices are best for you. Grains Pastries or quick breads with added fat. [place] toast. Vegetables Deep fried vegetables. [place] fries. Any vegetables prepared with added fat. Any vegetables that cause symptoms. For some people this may include tomatoes and tomato products, chili peppers, onions and garlic, and horseradish. Fruits Any fruits prepared  with added fat. Any fruits that cause symptoms. For some people this may include citrus fruits, such as oranges, grapefruit, pineapple, and lemons. Meats and other protein foods High-fat meats, such as fatty beef or pork, hot dogs, ribs, ham, sausage, salami and bacon. Fried meat or protein, including fried fish and fried chicken. Nuts and nut butters. Dairy Whole milk and chocolate milk. Sour  cream. Cream. Ice cream. Cream cheese. Milk shakes. Beverages Coffee and tea, with or without caffeine. Carbonated beverages. Sodas. Energy drinks. Fruit juice made with acidic fruits (such as orange or grapefruit). Tomato juice. Alcoholic drinks. Fats and oils Butter. Margarine. Shortening. Ghee. Sweets and desserts Chocolate and cocoa. Donuts. Seasoning and other foods Pepper. Peppermint and spearmint. Any condiments, herbs, or seasonings that cause symptoms. For some people, this may include curry, hot sauce, or vinegar-based salad dressings. Summary  When you have gastroesophageal reflux disease (GERD), food and lifestyle choices are very important to help ease the discomfort of GERD.  Eat frequent, small meals instead of three large meals each day. Eat your meals slowly, in a relaxed setting. Avoid bending over or lying down until 2-3 hours after eating.  Limit high-fat foods such as fatty meat or fried foods. This information is not intended to replace advice given to you by your health care provider. Make sure you discuss any questions you have with your health care provider. Document Revised: 12/20/2018 Document Reviewed: 08/30/2016 Elsevier Patient Education  [facility].

## 2020-07-25 ENCOUNTER — Inpatient Hospital Stay (HOSPITAL_COMMUNITY): Payer: Medicare Other

## 2020-07-25 ENCOUNTER — Encounter (HOSPITAL_COMMUNITY): Payer: Self-pay | Admitting: Family Medicine

## 2020-07-25 DIAGNOSIS — G8929 Other chronic pain: Secondary | ICD-10-CM | POA: Diagnosis present

## 2020-07-25 DIAGNOSIS — Z23 Encounter for immunization: Secondary | ICD-10-CM | POA: Diagnosis not present

## 2020-07-25 DIAGNOSIS — J9611 Chronic respiratory failure with hypoxia: Secondary | ICD-10-CM | POA: Diagnosis not present

## 2020-07-25 DIAGNOSIS — Z20822 Contact with and (suspected) exposure to covid-19: Secondary | ICD-10-CM | POA: Diagnosis present

## 2020-07-25 DIAGNOSIS — N1831 Chronic kidney disease, stage 3a: Secondary | ICD-10-CM

## 2020-07-25 DIAGNOSIS — E875 Hyperkalemia: Secondary | ICD-10-CM | POA: Diagnosis present

## 2020-07-25 DIAGNOSIS — N179 Acute kidney failure, unspecified: Secondary | ICD-10-CM | POA: Diagnosis present

## 2020-07-25 DIAGNOSIS — R109 Unspecified abdominal pain: Secondary | ICD-10-CM

## 2020-07-25 DIAGNOSIS — E1169 Type 2 diabetes mellitus with other specified complication: Secondary | ICD-10-CM

## 2020-07-25 DIAGNOSIS — I252 Old myocardial infarction: Secondary | ICD-10-CM | POA: Diagnosis not present

## 2020-07-25 DIAGNOSIS — I471 Supraventricular tachycardia: Secondary | ICD-10-CM | POA: Diagnosis not present

## 2020-07-25 DIAGNOSIS — E872 Acidosis: Secondary | ICD-10-CM | POA: Diagnosis present

## 2020-07-25 DIAGNOSIS — Z66 Do not resuscitate: Secondary | ICD-10-CM | POA: Diagnosis present

## 2020-07-25 DIAGNOSIS — G9341 Metabolic encephalopathy: Secondary | ICD-10-CM | POA: Diagnosis present

## 2020-07-25 DIAGNOSIS — I130 Hypertensive heart and chronic kidney disease with heart failure and stage 1 through stage 4 chronic kidney disease, or unspecified chronic kidney disease: Secondary | ICD-10-CM | POA: Diagnosis present

## 2020-07-25 DIAGNOSIS — E874 Mixed disorder of acid-base balance: Secondary | ICD-10-CM | POA: Diagnosis not present

## 2020-07-25 DIAGNOSIS — J439 Emphysema, unspecified: Secondary | ICD-10-CM

## 2020-07-25 DIAGNOSIS — K219 Gastro-esophageal reflux disease without esophagitis: Secondary | ICD-10-CM | POA: Diagnosis present

## 2020-07-25 DIAGNOSIS — E1122 Type 2 diabetes mellitus with diabetic chronic kidney disease: Secondary | ICD-10-CM | POA: Diagnosis present

## 2020-07-25 DIAGNOSIS — N170 Acute kidney failure with tubular necrosis: Secondary | ICD-10-CM | POA: Diagnosis present

## 2020-07-25 DIAGNOSIS — E871 Hypo-osmolality and hyponatremia: Secondary | ICD-10-CM | POA: Diagnosis present

## 2020-07-25 DIAGNOSIS — J9621 Acute and chronic respiratory failure with hypoxia: Secondary | ICD-10-CM | POA: Diagnosis present

## 2020-07-25 DIAGNOSIS — R578 Other shock: Secondary | ICD-10-CM | POA: Diagnosis not present

## 2020-07-25 DIAGNOSIS — E1142 Type 2 diabetes mellitus with diabetic polyneuropathy: Secondary | ICD-10-CM | POA: Diagnosis present

## 2020-07-25 DIAGNOSIS — I2723 Pulmonary hypertension due to lung diseases and hypoxia: Secondary | ICD-10-CM | POA: Diagnosis present

## 2020-07-25 DIAGNOSIS — R3589 Other polyuria: Secondary | ICD-10-CM | POA: Diagnosis present

## 2020-07-25 DIAGNOSIS — I5032 Chronic diastolic (congestive) heart failure: Secondary | ICD-10-CM | POA: Diagnosis present

## 2020-07-25 DIAGNOSIS — J9622 Acute and chronic respiratory failure with hypercapnia: Secondary | ICD-10-CM | POA: Diagnosis present

## 2020-07-25 DIAGNOSIS — Z8673 Personal history of transient ischemic attack (TIA), and cerebral infarction without residual deficits: Secondary | ICD-10-CM | POA: Diagnosis not present

## 2020-07-25 DIAGNOSIS — E785 Hyperlipidemia, unspecified: Secondary | ICD-10-CM

## 2020-07-25 HISTORY — DX: Type 2 diabetes mellitus with diabetic polyneuropathy: E11.42

## 2020-07-25 LAB — URINALYSIS, ROUTINE W REFLEX MICROSCOPIC
Bilirubin Urine: NEGATIVE
Glucose, UA: NEGATIVE mg/dL
Hgb urine dipstick: NEGATIVE
Ketones, ur: NEGATIVE mg/dL
Leukocytes,Ua: NEGATIVE
Nitrite: NEGATIVE
Protein, ur: 30 mg/dL — AB
Specific Gravity, Urine: 1.012 (ref 1.005–1.030)
pH: 5 (ref 5.0–8.0)

## 2020-07-25 LAB — CBC WITH DIFFERENTIAL/PLATELET
Abs Immature Granulocytes: 0.02 10*3/uL (ref 0.00–0.07)
Abs Immature Granulocytes: 0.04 10*3/uL (ref 0.00–0.07)
Basophils Absolute: 0.1 10*3/uL (ref 0.0–0.1)
Basophils Absolute: 0.1 10*3/uL (ref 0.0–0.1)
Basophils Relative: 1 %
Basophils Relative: 1 %
Eosinophils Absolute: 0 10*3/uL (ref 0.0–0.5)
Eosinophils Absolute: 0.2 10*3/uL (ref 0.0–0.5)
Eosinophils Relative: 0 %
Eosinophils Relative: 2 %
HCT: 41 % (ref 39.0–52.0)
HCT: 41.8 % (ref 39.0–52.0)
Hemoglobin: 11.5 g/dL — ABNORMAL LOW (ref 13.0–17.0)
Hemoglobin: 12.2 g/dL — ABNORMAL LOW (ref 13.0–17.0)
Immature Granulocytes: 0 %
Immature Granulocytes: 1 %
Lymphocytes Relative: 10 %
Lymphocytes Relative: 23 %
Lymphs Abs: 0.7 10*3/uL (ref 0.7–4.0)
Lymphs Abs: 1.5 10*3/uL (ref 0.7–4.0)
MCH: 22.2 pg — ABNORMAL LOW (ref 26.0–34.0)
MCH: 22.7 pg — ABNORMAL LOW (ref 26.0–34.0)
MCHC: 28 g/dL — ABNORMAL LOW (ref 30.0–36.0)
MCHC: 29.2 g/dL — ABNORMAL LOW (ref 30.0–36.0)
MCV: 77.7 fL — ABNORMAL LOW (ref 80.0–100.0)
MCV: 79.3 fL — ABNORMAL LOW (ref 80.0–100.0)
Monocytes Absolute: 0.6 10*3/uL (ref 0.1–1.0)
Monocytes Absolute: 0.7 10*3/uL (ref 0.1–1.0)
Monocytes Relative: 10 %
Monocytes Relative: 8 %
Neutro Abs: 4.1 10*3/uL (ref 1.7–7.7)
Neutro Abs: 5.4 10*3/uL (ref 1.7–7.7)
Neutrophils Relative %: 64 %
Neutrophils Relative %: 80 %
Platelets: 348 10*3/uL (ref 150–400)
Platelets: 358 10*3/uL (ref 150–400)
RBC: 5.17 MIL/uL (ref 4.22–5.81)
RBC: 5.38 MIL/uL (ref 4.22–5.81)
RDW: 18.3 % — ABNORMAL HIGH (ref 11.5–15.5)
RDW: 18.8 % — ABNORMAL HIGH (ref 11.5–15.5)
WBC: 6.5 10*3/uL (ref 4.0–10.5)
WBC: 6.8 10*3/uL (ref 4.0–10.5)
nRBC: 0.3 % — ABNORMAL HIGH (ref 0.0–0.2)
nRBC: 0.3 % — ABNORMAL HIGH (ref 0.0–0.2)

## 2020-07-25 LAB — TROPONIN I (HIGH SENSITIVITY)
Troponin I (High Sensitivity): 11 ng/L (ref <18)
Troponin I (High Sensitivity): 13 ng/L (ref <18)

## 2020-07-25 LAB — COMPREHENSIVE METABOLIC PANEL
ALT: 29 U/L (ref 0–44)
ALT: 34 U/L (ref 0–44)
AST: 33 U/L (ref 15–41)
AST: 41 U/L (ref 15–41)
Albumin: 3.9 g/dL (ref 3.5–5.0)
Albumin: 4 g/dL (ref 3.5–5.0)
Alkaline Phosphatase: 86 U/L (ref 38–126)
Alkaline Phosphatase: 90 U/L (ref 38–126)
Anion gap: 11 (ref 5–15)
Anion gap: 14 (ref 5–15)
BUN: 67 mg/dL — ABNORMAL HIGH (ref 8–23)
BUN: 69 mg/dL — ABNORMAL HIGH (ref 8–23)
CO2: 21 mmol/L — ABNORMAL LOW (ref 22–32)
CO2: 25 mmol/L (ref 22–32)
Calcium: 8 mg/dL — ABNORMAL LOW (ref 8.9–10.3)
Calcium: 8.2 mg/dL — ABNORMAL LOW (ref 8.9–10.3)
Chloride: 96 mmol/L — ABNORMAL LOW (ref 98–111)
Chloride: 97 mmol/L — ABNORMAL LOW (ref 98–111)
Creatinine, Ser: 3.56 mg/dL — ABNORMAL HIGH (ref 0.61–1.24)
Creatinine, Ser: 3.6 mg/dL — ABNORMAL HIGH (ref 0.61–1.24)
GFR, Estimated: 18 mL/min — ABNORMAL LOW (ref >60)
GFR, Estimated: 18 mL/min — ABNORMAL LOW (ref >60)
Glucose, Bld: 119 mg/dL — ABNORMAL HIGH (ref 70–99)
Glucose, Bld: 84 mg/dL (ref 70–99)
Potassium: 4.9 mmol/L (ref 3.5–5.1)
Potassium: 6.2 mmol/L — ABNORMAL HIGH (ref 3.5–5.1)
Sodium: 132 mmol/L — ABNORMAL LOW (ref 135–145)
Sodium: 132 mmol/L — ABNORMAL LOW (ref 135–145)
Total Bilirubin: 0.5 mg/dL (ref 0.3–1.2)
Total Bilirubin: 0.6 mg/dL (ref 0.3–1.2)
Total Protein: 7.4 g/dL (ref 6.5–8.1)
Total Protein: 7.6 g/dL (ref 6.5–8.1)

## 2020-07-25 LAB — BLOOD GAS, VENOUS
Acid-base deficit: 2.3 mmol/L — ABNORMAL HIGH (ref 0.0–2.0)
Bicarbonate: 19.8 mmol/L — ABNORMAL LOW (ref 20.0–28.0)
FIO2: 32
O2 Saturation: 45.5 %
Patient temperature: 37
pCO2, Ven: 76.6 mmHg (ref 44.0–60.0)
pH, Ven: 7.15 — CL (ref 7.250–7.430)
pO2, Ven: 37.5 mmHg (ref 32.0–45.0)

## 2020-07-25 LAB — MAGNESIUM: Magnesium: 1.4 mg/dL — ABNORMAL LOW (ref 1.7–2.4)

## 2020-07-25 LAB — LIPASE, BLOOD: Lipase: 61 U/L — ABNORMAL HIGH (ref 11–51)

## 2020-07-25 LAB — SODIUM, URINE, RANDOM: Sodium, Ur: 11 mmol/L

## 2020-07-25 LAB — CREATININE, URINE, RANDOM: Creatinine, Urine: 176.43 mg/dL

## 2020-07-25 MED ORDER — HEPARIN SODIUM (PORCINE) 5000 UNIT/ML IJ SOLN
5000.0000 [IU] | Freq: Three times a day (TID) | INTRAMUSCULAR | Status: DC
  Administered 2020-07-25 – 2020-07-26 (×6): 5000 [IU] via SUBCUTANEOUS
  Filled 2020-07-25 (×6): qty 1

## 2020-07-25 MED ORDER — OXYCODONE HCL 5 MG PO TABS
5.0000 mg | ORAL_TABLET | ORAL | Status: DC | PRN
  Administered 2020-07-25 – 2020-07-26 (×2): 5 mg via ORAL
  Filled 2020-07-25 (×2): qty 1

## 2020-07-25 MED ORDER — ALBUTEROL SULFATE (2.5 MG/3ML) 0.083% IN NEBU
2.5000 mg | INHALATION_SOLUTION | RESPIRATORY_TRACT | Status: DC | PRN
  Filled 2020-07-25: qty 3

## 2020-07-25 MED ORDER — ALBUTEROL SULFATE HFA 108 (90 BASE) MCG/ACT IN AERS: 2.0000 | INHALATION_SPRAY | RESPIRATORY_TRACT | Status: DC | PRN

## 2020-07-25 MED ORDER — ONDANSETRON HCL 4 MG/2ML IJ SOLN: 4.0000 mg | Freq: Four times a day (QID) | INTRAMUSCULAR | Status: DC | PRN

## 2020-07-25 MED ORDER — INFLUENZA VAC SPLIT QUAD 0.5 ML IM SUSY
0.5000 mL | PREFILLED_SYRINGE | INTRAMUSCULAR | Status: DC
  Filled 2020-07-25: qty 0.5

## 2020-07-25 MED ORDER — FLUTICASONE FUROATE-VILANTEROL 100-25 MCG/INH IN AEPB
1.0000 | INHALATION_SPRAY | Freq: Every day | RESPIRATORY_TRACT | Status: DC
  Administered 2020-07-25 – 2020-07-27 (×3): 1 via RESPIRATORY_TRACT
  Filled 2020-07-25: qty 28

## 2020-07-25 MED ORDER — METOPROLOL TARTRATE 12.5 MG HALF TABLET
12.5000 mg | ORAL_TABLET | Freq: Two times a day (BID) | ORAL | Status: DC
  Administered 2020-07-25 – 2020-07-26 (×3): 12.5 mg via ORAL
  Filled 2020-07-25 (×3): qty 1

## 2020-07-25 MED ORDER — MAGNESIUM SULFATE 2 GM/50ML IV SOLN
2.0000 g | Freq: Once | INTRAVENOUS | Status: AC
  Administered 2020-07-25: 2 g via INTRAVENOUS
  Filled 2020-07-25: qty 50

## 2020-07-25 MED ORDER — METHOCARBAMOL 500 MG PO TABS: 500.0000 mg | ORAL_TABLET | Freq: Three times a day (TID) | ORAL | Status: DC | PRN

## 2020-07-25 MED ORDER — SODIUM CHLORIDE 0.9 % IV SOLN: INTRAVENOUS | Status: AC

## 2020-07-25 MED ORDER — ROSUVASTATIN CALCIUM 5 MG PO TABS
10.0000 mg | ORAL_TABLET | Freq: Every day | ORAL | Status: DC
  Administered 2020-07-25 – 2020-07-26 (×2): 10 mg via ORAL
  Filled 2020-07-25 (×3): qty 1

## 2020-07-25 MED ORDER — ONDANSETRON HCL 4 MG PO TABS
4.0000 mg | ORAL_TABLET | Freq: Four times a day (QID) | ORAL | Status: DC | PRN
  Administered 2020-07-25: 4 mg via ORAL
  Filled 2020-07-25: qty 1

## 2020-07-25 MED ORDER — ACETAMINOPHEN 650 MG RE SUPP: 650.0000 mg | Freq: Four times a day (QID) | RECTAL | Status: DC | PRN

## 2020-07-25 MED ORDER — FLUTICASONE-UMECLIDIN-VILANT 100-62.5-25 MCG/INH IN AEPB: 1.0000 | INHALATION_SPRAY | Freq: Every day | RESPIRATORY_TRACT | Status: DC

## 2020-07-25 MED ORDER — SODIUM CHLORIDE 0.9 % IV BOLUS
500.0000 mL | Freq: Once | INTRAVENOUS | Status: AC
  Administered 2020-07-25: 500 mL via INTRAVENOUS

## 2020-07-25 MED ORDER — SODIUM CHLORIDE 0.9 % IV SOLN: INTRAVENOUS | Status: DC

## 2020-07-25 MED ORDER — MORPHINE SULFATE (PF) 2 MG/ML IV SOLN
2.0000 mg | INTRAVENOUS | Status: DC | PRN
  Administered 2020-07-26: 2 mg via INTRAVENOUS
  Filled 2020-07-25: qty 1

## 2020-07-25 MED ORDER — UMECLIDINIUM BROMIDE 62.5 MCG/INH IN AEPB
1.0000 | INHALATION_SPRAY | Freq: Every day | RESPIRATORY_TRACT | Status: DC
  Administered 2020-07-25 – 2020-07-27 (×3): 1 via RESPIRATORY_TRACT
  Filled 2020-07-25: qty 7

## 2020-07-25 MED ORDER — SODIUM ZIRCONIUM CYCLOSILICATE 5 G PO PACK
5.0000 g | PACK | Freq: Once | ORAL | Status: AC
  Administered 2020-07-25: 5 g via ORAL
  Filled 2020-07-25: qty 1

## 2020-07-25 MED ORDER — GABAPENTIN 300 MG PO CAPS
300.0000 mg | ORAL_CAPSULE | Freq: Two times a day (BID) | ORAL | Status: DC
  Administered 2020-07-25 – 2020-07-26 (×3): 300 mg via ORAL
  Filled 2020-07-25 (×3): qty 1

## 2020-07-25 MED ORDER — ACETAMINOPHEN 325 MG PO TABS: 650.0000 mg | ORAL_TABLET | Freq: Four times a day (QID) | ORAL | Status: DC | PRN

## 2020-07-25 MED ORDER — GUAIFENESIN ER 600 MG PO TB12
600.0000 mg | ORAL_TABLET | Freq: Two times a day (BID) | ORAL | Status: DC
  Administered 2020-07-25 – 2020-07-26 (×4): 600 mg via ORAL
  Filled 2020-07-25 (×4): qty 1

## 2020-07-25 NOTE — ED Notes (Signed)
Date and time results received: 07/25/20 0453   Test: pH Critical Value: 7.15  Name of Provider Notified: Dr Orlin Hilding  Orders Received? Or Actions Taken?: NA

## 2020-07-25 NOTE — H&P (Signed)
TRH H&P    [name], is a 64 y.o. male  MRN: [id number]  DOB - December 30, 1955  Admit Date - 07/14/2020  Referring MD/NP/PA: Roxanne Mins  Outpatient Primary MD for the patient is Wynonia Hazard, NP  Patient coming from: Home  Chief complaint-abdominal pain   HPI:    Bryan [name]  is a 64 y.o. male, with history of tubular adenoma, CVA, diabetes mellitus type 2, NSTEMI in March 2021, hypertension, epigastric pain, COPD, acid reflux, and more presents the ED with a chief complaint of abdominal pain.  Reports abdominal pain that follows the distribution to a band around the top of his abdomen.  He reports it is sharp and burning.  It happens every day all throughout the day.  Last for 15 to 20 minutes when it is there.  It is worse with ambulation, and bearing down for a bowel movement.  Patient reports that the onset was approximately a year ago.  He has not been vomiting.  He reports bowel movements are regular.  He has been following with GI.  They have advised no NSAIDs, and they switched his Protonix to [place] yesterday.  Patient reports that he does not believe he has adequate pain control his abdominal pain at home.  As for his shortness of breath, it is worse with deep inspiration as it makes his abdomen hurt.  He does report that he is short of breath every day, and today is the same as his baseline.  He wears 2.5 L of oxygen at home, and he is maintaining his saturations on 2.5 L of oxygen here.  Patient reports his dyspnea is worse with exertion, as it always is.  Again he denies chest pain.  Patient reports that he does not smoke, does not drink, does not use illicit drugs, is not vaccinated for Covid.  He would like to be DNR.  Patient is actually being admitted because blood work revealed an AKI.  He seems to have had an uptrending creatinine over the last 2 draws, but a month ago his  creatinine was 2.5 and today is 3.6.  Patient reports that he has polyuria, and voids 8 or 9 times at night.  This has been going on for a long time.  He has not had any dysuria.  He has not noticed any hematuria.  He has not had any decrease in urine output.  His urine habits are at his baseline.  He reports that he has not been taking large amounts of NSAIDs.  He has been eating and drinking normally at home.  In the ED Temperature 97.5, heart rate 61-83, respiratory rate 21-31, blood pressure 76/66 215/90 Satting at 98% on his baseline 2.5 L nasal cannula No leukocytosis Chemistry panel reveals a BUN of 67, creatinine of 3.6 Lipase is 61 Negative Covid and flu Chest x-ray reveals emphysema no acute process EKG shows a heart rate of 70, sinus rhythm, QTC 421, nonspecific T wave changes Morphine and Zofran given in the ED Troponin downtrending from  13-11 Admission requested for management of AKI    Review of systems:    In addition to the HPI above,  No Fever-chills, No Headache, No changes with Vision or hearing, No problems swallowing food or Liquids, No Chest pain, Cough patient reports baseline shortness of breath Patient reports baseline abdominal pain, no Nausea or Vomiting, bowel movements are regular, No Blood in stool or Urine, No dysuria, admits to urinary frequency No new skin rashes or bruises, No new joints pains-aches,  No new weakness, tingling, numbness in any extremity, No recent weight gain or loss, No polydypsia or polyphagia, No significant Mental Stressors.  All other systems reviewed and are negative.    Past History of the following :    Past Medical History:  Diagnosis Date  . Abdominal pain   . Acid reflux   . Arthritis   . Asthma   . Chest pain   . Chronic pain    with leg and back pain (disc problem)  . COPD (chronic obstructive pulmonary disease) ([place])   . Diabetes mellitus without complication ([place])   . Epigastric pain   . Headache     HX OF  . History of upper GI x-ray series    to follow showed large duodenal ulcer H pylori serologies were negative  . Hypertension   . NSTEMI (non-ST elevated myocardial infarction) ([place]) 11/2019  . Pneumonia    07/17/17  . Pre-diabetes   . Stroke [facility])    TIA MINI STROKE  . Tubular adenoma       Past Surgical History:  Procedure Laterality Date  . BACK SURGERY  7/05; 5/09    Dr.Hirsch,3 lumbar  X3  . BACK SURGERY    . COLONOSCOPY  Feb 2012   Dr. Gala Romney: normal rectum, pedunculate polyp removed but not recovered  . COLONOSCOPY WITH ESOPHAGOGASTRODUODENOSCOPY (EGD) N/A 11/18/2013   Dr.Rourk- tcs= normal rectum, multipe polyps about the ileocecal valve and distal transverse colon o/w the remainder of the colonic mucosa appeared normal bx= tubular adenoma. EGD= normal esophagus, stomach with scattered erosions mottling, friablility, no ulcer or infiltrating process patent pylorus bx= chronic inflammation. next TCS 11/2016  . COLONOSCOPY WITH PROPOFOL N/A 05/25/2017   Dr. Gala Romney: sigmoid diverticulosis, one 4 mm hyerplastic rectal polyp, ascending colonic AVMs surveillance 2023  . COLONOSCOPY WITH PROPOFOL N/A 04/16/2020   Procedure: COLONOSCOPY WITH PROPOFOL;  Surgeon: Daneil Dolin, MD; 5 mm tubular adenoma in the rectum, otherwise normal exam.  . ESOPHAGOGASTRODUODENOSCOPY  1/06   Dr. Volney American esophageal erosions,U-shaped stomach,marked erosions and edema of the bulb without discrete ulcer disease.   . ESOPHAGOGASTRODUODENOSCOPY (EGD) WITH PROPOFOL N/A 05/25/2017   Dr. Gala Romney: reflux esophagitis s/p empiric dilation, normal stomach and duodenum  . ESOPHAGOGASTRODUODENOSCOPY (EGD) WITH PROPOFOL Left 11/30/2019   LA Grade D esophagitis, small hiatal hernia, one non-bleeding cratered gastric ulcer in prepyloric region of stomach, many non-bleeding cratered duodenal ulcers without stigmata of bleeding, mild luminal narrowing at apex of duodenal bulb but traversed easily. H.pylori serology  negative.  . ESOPHAGOGASTRODUODENOSCOPY (EGD) WITH PROPOFOL N/A 04/16/2020   Procedure: ESOPHAGOGASTRODUODENOSCOPY (EGD) WITH PROPOFOL;  Surgeon: Daneil Dolin, MD; normal esophagus, normal stomach, patent pylorus, normal examined duodenum, previously noted PUD and reflux esophagitis healed.   Marland Kitchen MALONEY DILATION N/A 05/25/2017   Procedure: Venia Minks DILATION;  Surgeon: Daneil Dolin, MD;  Location[facility];  Service: Endoscopy;  Laterality: N/A;  . NECK SURGERY  10/2007   PLATE IN NECK  . POLYPECTOMY  05/25/2017  Procedure: POLYPECTOMY;  Surgeon: Daneil Dolin, MD;  Location: [facility];  Service: Endoscopy;;  rectal  . RIGHT/LEFT HEART CATH AND CORONARY ANGIOGRAPHY N/A 12/03/2019   Procedure: RIGHT/LEFT HEART CATH AND CORONARY ANGIOGRAPHY;  Surgeon: Nigel Mormon, MD;  Location: [place] CV LAB;  Service: Cardiovascular;  Laterality: N/A;  . TRANSURETHRAL RESECTION OF PROSTATE N/A 08/17/2017   Procedure: TRANSURETHRAL RESECTION OF THE PROSTATE (TURP);  Surgeon: Irine Seal, MD;  Location: [facility];  Service: Urology;  Laterality: N/A;      Social History:      Social History   Tobacco Use  . Smoking status: Former Smoker    Packs/day: 0.50    Years: 40.00    Pack years: 20.00    Types: Cigarettes    Quit date: 11/11/2014    Years since quitting: 5.7  . Smokeless tobacco: Never Used  Substance Use Topics  . Alcohol use: No    Alcohol/week: 0.0 standard drinks       Family History :     Family History  Problem Relation Age of Onset  . Cancer Father   . Asthma Mother   . Colon cancer Neg Hx       Home Medications:   Prior to Admission medications   Medication Sig Start Date End Date Taking? Authorizing Provider  acetaminophen (TYLENOL) 325 MG tablet Take 2 tablets (650 mg total) by mouth every 6 (six) hours as needed for mild pain, fever or headache (or Fever >/= 101). 04/22/20   Roxan Hockey, MD  albuterol (PROVENTIL) (2.5 MG/3ML) 0.083% nebulizer  solution Take 3 mLs (2.5 mg total) by nebulization every 4 (four) hours as needed for wheezing or shortness of breath. 04/22/20   Roxan Hockey, MD  albuterol (VENTOLIN HFA) 108 (90 Base) MCG/ACT inhaler Inhale 2 puffs into the lungs every 4 (four) hours as needed for wheezing or shortness of breath. Do not use with nebulizer 04/22/20   Roxan Hockey, MD  calcium carbonate (TUMS - DOSED IN MG ELEMENTAL CALCIUM) 500 MG chewable tablet Chew 2 tablets (400 mg of elemental calcium total) by mouth 3 (three) times daily as needed for indigestion or heartburn. 12/04/19   Amin, Jeanella Flattery, MD  dexlansoprazole (DEXILANT) 60 MG capsule Take 1 capsule (60 mg total) by mouth daily. 07/16/2020   Erenest Rasher, PA-C  Fluticasone-Umeclidin-Vilant (TRELEGY ELLIPTA) 100-62.5-25 MCG/INH AEPB Inhale 1 puff into the lungs daily. 06/14/19   Roxan Hockey, MD  gabapentin (NEURONTIN) 300 MG capsule Take 1 capsule (300 mg total) by mouth 2 (two) times daily. Pt. Says he is taking twice daily. 06/14/19   Roxan Hockey, MD  guaiFENesin (MUCINEX) 600 MG 12 hr tablet Take 1 tablet (600 mg total) by mouth 2 (two) times daily. 04/22/20 04/22/21  Roxan Hockey, MD  hyoscyamine (LEVSIN) 0.125 MG tablet Take 1 tablet (0.125 mg total) by mouth every 4 (four) hours as needed. 07/23/2020   Erenest Rasher, PA-C  linaclotide [facility]) 72 MCG capsule Take 1 capsule (72 mcg total) by mouth daily before breakfast. Patient taking differently: Take 72 mcg by mouth daily before breakfast. As needed 06/14/19   Roxan Hockey, MD  magnesium oxide (MAG-OX) 400 MG tablet Take 400 mg by mouth daily.    [provider]  methocarbamol (ROBAXIN) 500 MG tablet Take 1 tablet (500 mg total) by mouth every 8 (eight) hours as needed for muscle spasms. 04/01/20   Maudie Flakes, MD  metoprolol tartrate (LOPRESSOR) 25 MG tablet Take 0.5  tablets (12.5 mg total) by mouth 2 (two) times daily. 12/04/19 03/20/21  Amin, Jeanella Flattery, MD    nitroGLYCERIN (NITROSTAT) 0.4 MG SL tablet Place 1 tablet (0.4 mg total) under the tongue every 5 (five) minutes as needed for chest pain. 05/19/20   Rolland Porter, MD  OXYGEN Inhale 2.5 L into the lungs daily.    [provider]  rosuvastatin (CRESTOR) 10 MG tablet Take 1 tablet (10 mg total) by mouth daily. 07/06/20   Freada Bergeron, MD  torsemide (DEMADEX) 20 MG tablet Take 1 tablet (20 mg total) by mouth 2 (two) times daily. For Fluid/Heart 04/22/20   Roxan Hockey, MD     Allergies:    No Known Allergies   Physical Exam:   Vitals  Blood pressure 117/79, pulse 75, temperature (!) 97.5 F (36.4 C), temperature source Oral, resp. rate (!) 24, height _0  (1.6 m), weight 54 kg, SpO2 96 %.  1.  General: Lying supine in bed no acute distress  2. Psychiatric: Mood and behavior normal for situation oriented x3  3. Neurologic: Cranial nerves II through XII are grossly intact, moves all 4 extremities voluntarily, at baseline  4. HEENMT:  Head is atraumatic, normocephalic, pupils reactive to light, neck is supple, trachea is midline, mucous membranes are mildly dry  5. Respiratory : Lungs are clear to auscultation bilaterally, no wheeze, no rhonchi, no rales, no labored breathing, no cyanosis  6. Cardiovascular : Heart rate is normal, rhythm is regular, no rubs or gallops, no peripheral edema  7. Gastrointestinal:  Abdomen is slightly distended, tender in the epigastric region, no palpable masses, bowel sounds active  8. Skin:  No acute lesion on limited skin exam  9.Musculoskeletal:  No peripheral edema, acute deformity, calf tenderness    Data Review:    CBC Recent Labs  Lab 07/31/2020 2344  WBC 6.5  HGB 12.2*  HCT 41.8  PLT 348  MCV 77.7*  MCH 22.7*  MCHC 29.2*  RDW 18.8*  LYMPHSABS 1.5  MONOABS 0.7  EOSABS 0.2  BASOSABS 0.1    ------------------------------------------------------------------------------------------------------------------  Results for orders placed or performed during the hospital encounter of 07/29/2020 (from the past 48 hour(s))  Respiratory Panel by RT PCR (Flu A&B, Covid) - Nasopharyngeal Swab     Status: None   Collection Time: 07/18/2020 10:22 PM   Specimen: Nasopharyngeal Swab  Result Value Ref Range   SARS Coronavirus 2 by RT PCR NEGATIVE NEGATIVE    Comment: (NOTE) SARS-CoV-2 target nucleic acids are NOT DETECTED.  The SARS-CoV-2 RNA is generally detectable in upper respiratoy specimens during the acute phase of infection. The lowest concentration of SARS-CoV-2 viral copies this assay can detect is 131 copies/mL. A negative result does not preclude SARS-Cov-2 infection and should not be used as the sole basis for treatment or other patient management decisions. A negative result may occur with  improper specimen collection/handling, submission of specimen other than nasopharyngeal swab, presence of viral mutation(s) within the areas targeted by this assay, and inadequate number of viral copies (<131 copies/mL). A negative result must be combined with clinical observations, patient history, and epidemiological information. The expected result is Negative.  Fact Sheet for Patients:  PinkCheek.be  Fact Sheet for Healthcare Providers:  GravelBags.it  This test is no t yet approved or cleared by the [place] FDA and  has been authorized for detection and/or diagnosis of SARS-CoV-2 by FDA under an Emergency Use Authorization (EUA). This EUA will remain  in effect (meaning  this test can be used) for the duration of the COVID-19 declaration under Section 564(b)(1) of the Act, 21 U.S.C. section 360bbb-3(b)(1), unless the authorization is terminated or revoked sooner.     Influenza A by PCR NEGATIVE NEGATIVE   Influenza B  by PCR NEGATIVE NEGATIVE    Comment: (NOTE) The Xpert Xpress SARS-CoV-2/FLU/RSV assay is intended as an aid in  the diagnosis of influenza from Nasopharyngeal swab specimens and  should not be used as a sole basis for treatment. Nasal washings and  aspirates are unacceptable for Xpert Xpress SARS-CoV-2/FLU/RSV  testing.  Fact Sheet for Patients: PinkCheek.be  Fact Sheet for Healthcare Providers: GravelBags.it  This test is not yet approved or cleared by the [place] FDA and  has been authorized for detection and/or diagnosis of SARS-CoV-2 by  FDA under an Emergency Use Authorization (EUA). This EUA will remain  in effect (meaning this test can be used) for the duration of the  Covid-19 declaration under Section 564(b)(1) of the Act, 21  U.S.C. section 360bbb-3(b)(1), unless the authorization is  terminated or revoked. Performed at [facility], [address], [place], [place] [address]   Comprehensive metabolic panel     Status: Abnormal   Collection Time: 08/08/2020 11:44 PM  Result Value Ref Range   Sodium 132 (L) 135 - 145 mmol/L   Potassium 4.9 3.5 - 5.1 mmol/L   Chloride 96 (L) 98 - 111 mmol/L   CO2 25 22 - 32 mmol/L   Glucose, Bld 119 (H) 70 - 99 mg/dL    Comment: Glucose reference range applies only to samples taken after fasting for at least 8 hours.   BUN 67 (H) 8 - 23 mg/dL   Creatinine, Ser 3.60 (H) 0.61 - 1.24 mg/dL   Calcium 8.2 (L) 8.9 - 10.3 mg/dL   Total Protein 7.6 6.5 - 8.1 g/dL   Albumin 4.0 3.5 - 5.0 g/dL   AST 33 15 - 41 U/L   ALT 29 0 - 44 U/L   Alkaline Phosphatase 90 38 - 126 U/L   Total Bilirubin 0.5 0.3 - 1.2 mg/dL   GFR, Estimated 18 (L) >60 mL/min    Comment: (NOTE) Calculated using the CKD-EPI Creatinine Equation (2021)    Anion gap 11 5 - 15    Comment: Performed at [facility], [address], [place], [place] [address]  Lipase, blood     Status: Abnormal   Collection Time:  08/02/2020 11:44 PM  Result Value Ref Range   Lipase 61 (H) 11 - 51 U/L    Comment: Performed at [facility], [address], [place], [place] [address]  Troponin I (High Sensitivity)     Status: None   Collection Time: 08/01/2020 11:44 PM  Result Value Ref Range   Troponin I (High Sensitivity) 13 <18 ng/L    Comment: (NOTE) Elevated high sensitivity troponin I (hsTnI) values and significant  changes across serial measurements may suggest ACS but many other  chronic and acute conditions are known to elevate hsTnI results.  Refer to the "Links" section for chest pain algorithms and additional  guidance. Performed at [facility], [address] [place], [place] [address]   CBC with Differential     Status: Abnormal   Collection Time: 08/06/2020 11:44 PM  Result Value Ref Range   WBC 6.5 4.0 - 10.5 K/uL   RBC 5.38 4.22 - 5.81 MIL/uL   Hemoglobin 12.2 (L) 13.0 - 17.0 g/dL   HCT 41.8 39 - 52 %  MCV 77.7 (L) 80.0 - 100.0 fL   MCH 22.7 (L) 26.0 - 34.0 pg   MCHC 29.2 (L) 30.0 - 36.0 g/dL   RDW 18.8 (H) 11.5 - 15.5 %   Platelets 348 150 - 400 K/uL   nRBC 0.3 (H) 0.0 - 0.2 %   Neutrophils Relative % 64 %   Neutro Abs 4.1 1.7 - 7.7 K/uL   Lymphocytes Relative 23 %   Lymphs Abs 1.5 0.7 - 4.0 K/uL   Monocytes Relative 10 %   Monocytes Absolute 0.7 0.1 - 1.0 K/uL   Eosinophils Relative 2 %   Eosinophils Absolute 0.2 0.0 - 0.5 K/uL   Basophils Relative 1 %   Basophils Absolute 0.1 0.0 - 0.1 K/uL   Immature Granulocytes 0 %   Abs Immature Granulocytes 0.02 0.00 - 0.07 K/uL    Comment: Performed at [facility], [address] [place], [place] [address]  Troponin I (High Sensitivity)     Status: None   Collection Time: 07/25/20  1:15 AM  Result Value Ref Range   Troponin I (High Sensitivity) 11 <18 ng/L    Comment: (NOTE) Elevated high sensitivity troponin I (hsTnI) values and significant  changes across serial measurements may suggest ACS but many other  chronic and acute conditions are  known to elevate hsTnI results.  Refer to the "Links" section for chest pain algorithms and additional  guidance. Performed at [facility], [address] [place], [place] [address]     Chemistries  Recent Labs  Lab 07/13/2020 2344  NA 132*  K 4.9  CL 96*  CO2 25  GLUCOSE 119*  BUN 67*  CREATININE 3.60*  CALCIUM 8.2*  AST 33  ALT 29  ALKPHOS 90  BILITOT 0.5   ------------------------------------------------------------------------------------------------------------------  ------------------------------------------------------------------------------------------------------------------ GFR: Estimated Creatinine Clearance: 15.8 mL/min (A) (by C-G formula based on SCr of 3.6 mg/dL (H)). Liver Function Tests: Recent Labs  Lab 08/10/2020 2344  AST 33  ALT 29  ALKPHOS 90  BILITOT 0.5  PROT 7.6  ALBUMIN 4.0   Recent Labs  Lab 08/02/2020 2344  LIPASE 61*   No results for input(s): AMMONIA in the last 168 hours. Coagulation Profile: No results for input(s): INR, PROTIME in the last 168 hours. Cardiac Enzymes: No results for input(s): CKTOTAL, CKMB, CKMBINDEX, TROPONINI in the last 168 hours. BNP (last 3 results) No results for input(s): PROBNP in the last 8760 hours. HbA1C: No results for input(s): HGBA1C in the last 72 hours. CBG: No results for input(s): GLUCAP in the last 168 hours. Lipid Profile: No results for input(s): CHOL, HDL, LDLCALC, TRIG, CHOLHDL, LDLDIRECT in the last 72 hours. Thyroid Function Tests: No results for input(s): TSH, T4TOTAL, FREET4, T3FREE, THYROIDAB in the last 72 hours. Anemia Panel: No results for input(s): VITAMINB12, FOLATE, FERRITIN, TIBC, IRON, RETICCTPCT in the last 72 hours.  --------------------------------------------------------------------------------------------------------------- Urine analysis:    Component Value Date/Time   COLORURINE YELLOW 04/08/2020 1250   APPEARANCEUR CLEAR 04/08/2020 1250   LABSPEC 1.017  04/08/2020 1250   PHURINE 6.0 04/08/2020 1250   GLUCOSEU NEGATIVE 04/08/2020 1250   HGBUR NEGATIVE 04/08/2020 1250   BILIRUBINUR NEGATIVE 04/08/2020 1250   KETONESUR NEGATIVE 04/08/2020 1250   PROTEINUR >=300 (A) 04/08/2020 1250   UROBILINOGEN 0.2 07/08/2015 1314   NITRITE NEGATIVE 04/08/2020 1250   LEUKOCYTESUR NEGATIVE 04/08/2020 1250      Imaging Results:    DG Chest Portable 1 View  Result Date: 07/30/2020 CLINICAL DATA:  Chest pain, shortness of breath EXAM:  PORTABLE CHEST 1 VIEW COMPARISON:  06/23/2020 FINDINGS: Single frontal view of the chest demonstrates a stable cardiac silhouette. Background emphysema unchanged, without airspace disease, effusion, or pneumothorax. Continued prominence of the hilar vasculature may reflect underlying pulmonary arterial hypertension. IMPRESSION: 1. Emphysema, no acute intrathoracic process. Electronically Signed   By: Randa Ngo M.D.   On: 08/05/2020 22:32    My personal review of EKG: Rhythm NSR, Rate 70/min, QTc 421,no Acute ST changes   Assessment & Plan:    Active Problems:   AKI (acute kidney injury) ([place])   1. AKI 1. Creatinine at baseline is 2.5 2. Today creatinine is 3.6 3. 500 normal fluid bolus, continue gentle hydration at 75 mils per hour 4. Hold Dexilant and torsemide 5. Trend with CMP in the a.m. 2. COPD 1. Not likely exacerbation -no wheezing on exam, patient reports this is his normal shortness of breath 2. VBG pending to evaluate for CO2 retention 3. Continue home medications 4. No steroid or antibiotic indicated at this time 3. Chronic abdominal pain 1. Abdominal pain has been going on for at least a year 2. Follows with GI clinic and saw them yesterday 3. Patient was started on Dexilant -holding secondary to AKI 4. Pain scale for pain control 5. Will likely need further outpatient treatment with GI 4. Diabetes mellitus type 2 1. Glucose on BMP was 119 2. Trend in a.m. 3. No insulin indicated at this  time 5. Epigastric/chest pain 1. Patient had indicated epigastric/chest pain to another provider, denies chest pain on my exam, admits to epigastric pain 2. Downtrending troponin from 13-11 3. Last echo 06/2020 was done for pulmonary hypertension and showed an ejection fraction of 55 to 60% with no LV regional wall motion abnormalities 4. In this case I believe the complaint to have been epigastric pain and not chest pain    DVT Prophylaxis-   Heparin- SCDs   AM Labs Ordered, also please review Full Orders  Family Communication: No family at bedside Code Status: DNR  Admission status: Observation  Time spent in minutes : [place] DO

## 2020-07-25 NOTE — ED Notes (Signed)
Assisted pt with ambulation to bathroom

## 2020-07-25 NOTE — ED Notes (Signed)
Date and time results received: 07/25/20 0453   Test: pCo2 Critical Value: 76.6  Name of Provider Notified: Dr Orlin Hilding  Orders Received? Or Actions Taken?: See chart

## 2020-07-25 NOTE — Hospital Course (Signed)
64 year old man PMH including chronic abdominal pain followed by GI, diabetes mellitus type 2, NSTEMI March 2021, COPD with hypoxic respiratory failure chronic, presented with shortness of breath and abdominal pain.  However the.  Abdominal pain and COPD was at baseline, was admitted for acute kidney injury.

## 2020-07-25 NOTE — ED Provider Notes (Signed)
Care assumed from Dr. Laverta [place], patient presented with shortness of breath and chronic abdominal pain.  He was signed out to me with labs pending.  Labs show evidence of acute kidney injury with creatinine 3.60 compared with 2.46 on 05/19/2020, and 1.69 on 04/28/2020.  Troponin is normal x2.  Mild hyponatremia is present and not felt to be clinically significant.  Mild anemia is also present and stable.  Patient will need to be admitted for further evaluation and treatment of his worsening renal function.  Case is discussed with Dr. Clearence Ped of [facility], who agrees to admit the patient.  Results for orders placed or performed during the hospital encounter of 08/07/2020  Respiratory Panel by RT PCR (Flu A&B, Covid) - Nasopharyngeal Swab   Specimen: Nasopharyngeal Swab  Result Value Ref Range   SARS Coronavirus 2 by RT PCR NEGATIVE NEGATIVE   Influenza A by PCR NEGATIVE NEGATIVE   Influenza B by PCR NEGATIVE NEGATIVE  Comprehensive metabolic panel  Result Value Ref Range   Sodium 132 (L) 135 - 145 mmol/L   Potassium 4.9 3.5 - 5.1 mmol/L   Chloride 96 (L) 98 - 111 mmol/L   CO2 25 22 - 32 mmol/L   Glucose, Bld 119 (H) 70 - 99 mg/dL   BUN 67 (H) 8 - 23 mg/dL   Creatinine, Ser 3.60 (H) 0.61 - 1.24 mg/dL   Calcium 8.2 (L) 8.9 - 10.3 mg/dL   Total Protein 7.6 6.5 - 8.1 g/dL   Albumin 4.0 3.5 - 5.0 g/dL   AST 33 15 - 41 U/L   ALT 29 0 - 44 U/L   Alkaline Phosphatase 90 38 - 126 U/L   Total Bilirubin 0.5 0.3 - 1.2 mg/dL   GFR, Estimated 18 (L) >60 mL/min   Anion gap 11 5 - 15  Lipase, blood  Result Value Ref Range   Lipase 61 (H) 11 - 51 U/L  CBC with Differential  Result Value Ref Range   WBC 6.5 4.0 - 10.5 K/uL   RBC 5.38 4.22 - 5.81 MIL/uL   Hemoglobin 12.2 (L) 13.0 - 17.0 g/dL   HCT 41.8 39 - 52 %   MCV 77.7 (L) 80.0 - 100.0 fL   MCH 22.7 (L) 26.0 - 34.0 pg   MCHC 29.2 (L) 30.0 - 36.0 g/dL   RDW 18.8 (H) 11.5 - 15.5 %   Platelets 348 150 - 400 K/uL   nRBC 0.3 (H) 0.0 - 0.2 %    Neutrophils Relative % 64 %   Neutro Abs 4.1 1.7 - 7.7 K/uL   Lymphocytes Relative 23 %   Lymphs Abs 1.5 0.7 - 4.0 K/uL   Monocytes Relative 10 %   Monocytes Absolute 0.7 0.1 - 1.0 K/uL   Eosinophils Relative 2 %   Eosinophils Absolute 0.2 0.0 - 0.5 K/uL   Basophils Relative 1 %   Basophils Absolute 0.1 0.0 - 0.1 K/uL   Immature Granulocytes 0 %   Abs Immature Granulocytes 0.02 0.00 - 0.07 K/uL  Troponin I (High Sensitivity)  Result Value Ref Range   Troponin I (High Sensitivity) 13 <18 ng/L  Troponin I (High Sensitivity)  Result Value Ref Range   Troponin I (High Sensitivity) 11 <18 ng/L   MR ANGIO ABDOMEN W WO CONTRAST  Result Date: 07/17/2020 CLINICAL DATA:  Abdominal pain EXAM: MRA ABDOMEN AND PELVIS WITH CONTRAST TECHNIQUE: Multiplanar, multiecho pulse sequences of the abdomen and pelvis were obtained with intravenous contrast. Angiographic images of abdomen and pelvis  were obtained using MRA technique with intravenous contrast. CONTRAST:  39m GADAVIST GADOBUTROL 1 MMOL/ML IV SOLN COMPARISON:  CTA chest 04/19/20 FINDINGS: MRA ABDOMEN FINDINGS Aorta: Normal caliber aorta.  No evidence of dissection or aneurysm. Celiac: Patent and unremarkable. No significant stenosis. Conventional hepatic arterial anatomy. SMA: Intermediate length moderate stenosis of the proximal SMA beginning approximately 2 cm beyond the origin. Renals: Solitary renal arteries bilaterally. No significant stenosis. IMA: Patent and unremarkable. Inflow: Focal moderate stenosis of the right common iliac artery secondary to calcified atherosclerotic plaque. Significant stenoses are present at the origins of the bilateral internal iliac arteries. There appears to be a focal penetrating ulceration versus non flow limiting dissection in the patulous left common carotid artery. Query changes of prior left common femoral endarterectomy. MRI ABDOMEN FINDINGS Nonspecific thickening of the gastric mucosa along the greater curvature  of the stomach measuring up to 2.6 cm in width. No underlying signal abnormality. No focal signal abnormality or abnormal enhancement in the visualized lower chest. No focal signal abnormality or abnormal enhancement within the visualized abdomen. Surgical changes of prior left posterior lumbar interbody fusion. IMPRESSION: VASCULAR 1. There is a moderate stenosis of the proximal superior mesenteric artery. It would be unlikely for this isolated lesion to result in symptoms of chronic mesenteric ischemia. 2. Widely patent celiac and inferior mesenteric arteries. 3. Focal moderate stenosis of the right common iliac artery. 4. Significant stenoses of the origins of the bilateral internal iliac arteries. 5. Penetrating atherosclerotic ulcer versus non flow limiting dissection in the patulous left common femoral artery. Query prior surgical history of left common femoral endarterectomy? NON VASCULAR 1. Nonspecific mucosal thickening along the greater curvature of the stomach. Findings are most consistent with gastritis. However, considerably less likely considerations include gastric lymphoma or adenocarcinoma. Consider outpatient referral to gastroenterology for upper endoscopy if not recently performed. 2. No other focal abnormality by MRI within the abdomen or pelvis. Electronically Signed   By: HJacqulynn CadetM.D.   On: 07/17/2020 16:43   DG Chest Portable 1 View  Result Date: 08/06/2020 CLINICAL DATA:  Chest pain, shortness of breath EXAM: PORTABLE CHEST 1 VIEW COMPARISON:  06/23/2020 FINDINGS: Single frontal view of the chest demonstrates a stable cardiac silhouette. Background emphysema unchanged, without airspace disease, effusion, or pneumothorax. Continued prominence of the hilar vasculature may reflect underlying pulmonary arterial hypertension. IMPRESSION: 1. Emphysema, no acute intrathoracic process. Electronically Signed   By: MRanda NgoM.D.   On: [id number]:[id number]     GDelora Fuel  MD 102/58/[phone]

## 2020-07-25 NOTE — Progress Notes (Signed)
PROGRESS NOTE  LATHYN GRIGGS TCY:[id number] DOB: 1956-08-31 DOA: 07/23/2020 PCP: Wynonia Hazard, NP  Brief History   64 year old man PMH including chronic abdominal pain followed by GI, diabetes mellitus type 2, NSTEMI March 2021, COPD with hypoxic respiratory failure chronic, presented with shortness of breath and abdominal pain.  However the.  Abdominal pain and COPD was at baseline, was admitted for acute kidney injury.   A & P  Acute kidney injury on CKD stage IIIa --Dexilant and torsemide on hold.  Was just started on Dexilant so doubt contributory. --No significant change.  Etiology unclear.  MRI abdomen from November 5 showed patent renal arteries. --check u/a, FENa, check renal u/s --IVF, strict I/O  Hyperkalemia secondary to acute kidney injury --No hemolysis noted.  Lokelma x1.  Hypomagnesemia --Replete  Chronic abdominal pain, PMH GERD, PUD, severe grade D reflux esophagitis, dysphagia, PPE without motility issues, constipation --Gastroenterology and seen last 11/12 at which time outpatient EEG was planned.  Started on trial of Dexilant, Bentyl stopped, started on Levsin, continued on Linzess.  Was referred to vascular surgery for vascular abnormalities on MRI.  These abnormalities are not thought to be contributory to pain or represent chronic mesenteric ischemia. --lipase w/ trivial elevation --Monitor.  Follow-up as an outpatient.  COPD, emphysema, chronic hypoxic respiratory failure, WHO group 3 pulmonary hypertension (secondary to COPD and hypoxia), possible sleep apnea --CXR no acute disease, SpO2 stable on home oxygen.  Although subjectively worse, appears stable. --Continue trilogy, bronchodilators.  Enrolled in outpatient rehab.  Pulmonology arranging outpatient sleep study. --Goal SPO2 greater than 90%.  Continue 2 L oxygen 24/7.  Diabetes mellitus type II w/ peripheral polyneuropathy hands/feet --CBG stable, no home meds listed, A1c 6.7 6/21 --no SSI as CBG  stable, continue gabapentin  Disposition Plan:  Discussion: Chronically ill man presented with chronic abdominal pain and shortness of breath.  Closely followed by gastroenterology with extensive work-up in the past.  Exam benign, do not see evidence to suggest any new process.  Shortness of breath appears to be stable, stable on home oxygen.  Main issues acute kidney injury of unclear etiology.  Work-up as above.  Dispo: The patient is from: Home              Anticipated d/c is to: Home              Anticipated d/c date is: 3 days              Patient currently is not medically stable to d/c.  DVT prophylaxis: heparin injection 5,000 Units Start: 07/25/20 0600 SCDs Start: 07/25/20 0356   Code Status: DNR Family Communication: none  Murray Hodgkins, MD  [facility] Direct contact: see [email or web address] (further directions at bottom of note if needed) 7PM-7AM contact night coverage as at bottom of note 07/25/2020, 9:52 AM  LOS: 0 days   Significant Hospital Events   .    Consults:  .    Procedures:  .   Significant Diagnostic Tests:  . EKG SR no acute changes   Micro Data:  .    Antimicrobials:  .   Interval History/Subjective  CC: f/u abd pain  Feels poorly, ongoing abd pain worse over the last week, upper abd, constant, no aggravating/allievating factors. SOB worse over the last week. Eating and drinking fluids at home, urinating "too much".   Reports months of numbness both hands and feet "I'm prediabetic".  Objective   Vitals:  Vitals:   07/25/20 0903 07/25/20  0930  BP: 110/65 105/72  Pulse: 80 80  Resp: 19 (!) 29  Temp:    SpO2: 94% 99%    Exam: Constitutional:   . Appears calm, uncomfortable, chronically ill ENMT:  . grossly normal hearing  Respiratory:  . CTA bilaterally, no w/r/r.  . Respiratory effort normal.  Cardiovascular:  . RRR, no m/r/g . Telemetry SR . No LE extremity edema   . Perfusion both feet appears intact Abdomen:  . Abdomen  appears normal; epigastric tenderness, nondistended . No hernias noted Musculoskeletal:  . RUE, LUE, RLE, LLE   o tone normal, no atrophy, no abnormal movements o No tenderness, masses Skin:  . Skin both feet appears unremarkable Psychiatric:  . Mental status o Mood, affect appropriate  I have personally reviewed the following:   Today's Data  . K+ 4.9 > 6.2 no hemolysis noted . Creatinine 3.6 > 3.56 . Mg 1.4 > replete . LFTs unremarkable . troponins negative . Hgb stable 11.5  Scheduled Meds: . umeclidinium bromide  1 puff Inhalation Daily   And  . fluticasone furoate-vilanterol  1 puff Inhalation Daily  . gabapentin  300 mg Oral BID  . guaiFENesin  600 mg Oral BID  . heparin  5,000 Units Subcutaneous Q8H  . metoprolol tartrate  12.5 mg Oral BID  . rosuvastatin  10 mg Oral Daily  . sodium zirconium cyclosilicate  5 g Oral Once   Continuous Infusions: . sodium chloride 75 mL/hr at 07/25/20 0320  . magnesium sulfate bolus IVPB      Principal Problem:   AKI (acute kidney injury) ([place]) Active Problems:   COPD (chronic obstructive pulmonary disease) (HCC)   Chronic abdominal pain   Type 2 diabetes mellitus with hyperlipidemia (HCC)   CKD (chronic kidney disease) stage 3, GFR 30-59 ml/min (HCC)   Chronic respiratory failure with hypoxia (HCC)   Hypomagnesemia   Diabetic peripheral neuropathy (HCC)   LOS: 0 days   How to contact the [facility] Attending or Consulting provider 7A - 7P or covering provider during after hours [place], for this patient?  1. Check the care team in [facility] and look for a) attending/consulting TRH provider listed and b) the [facility] team listed 2. Log into [email or web address] and use [place]'s universal password to access. If you do not have the password, please contact the hospital operator. 3. Locate the [facility] provider you are looking for under [facility] and page to a number that you can be directly reached. 4. If you still have difficulty reaching  the provider, please page the [facility] (Director on Call) for the Hospitalists listed on amion for assistance.

## 2020-07-26 DIAGNOSIS — E872 Acidosis: Secondary | ICD-10-CM

## 2020-07-26 DIAGNOSIS — R109 Unspecified abdominal pain: Secondary | ICD-10-CM | POA: Diagnosis not present

## 2020-07-26 DIAGNOSIS — N1831 Chronic kidney disease, stage 3a: Secondary | ICD-10-CM | POA: Diagnosis not present

## 2020-07-26 DIAGNOSIS — N179 Acute kidney failure, unspecified: Secondary | ICD-10-CM | POA: Diagnosis not present

## 2020-07-26 DIAGNOSIS — J9611 Chronic respiratory failure with hypoxia: Secondary | ICD-10-CM | POA: Diagnosis not present

## 2020-07-26 LAB — BASIC METABOLIC PANEL
Anion gap: 17 — ABNORMAL HIGH (ref 5–15)
Anion gap: >20 — ABNORMAL HIGH (ref 5–15)
BUN: 93 mg/dL — ABNORMAL HIGH (ref 8–23)
BUN: 96 mg/dL — ABNORMAL HIGH (ref 8–23)
CO2: 21 mmol/L — ABNORMAL LOW (ref 22–32)
CO2: 17 mmol/L — ABNORMAL LOW (ref 22–32)
Calcium: 7.3 mg/dL — ABNORMAL LOW (ref 8.9–10.3)
Calcium: 7.7 mg/dL — ABNORMAL LOW (ref 8.9–10.3)
Chloride: 96 mmol/L — ABNORMAL LOW (ref 98–111)
Chloride: 96 mmol/L — ABNORMAL LOW (ref 98–111)
Creatinine, Ser: 5.61 mg/dL — ABNORMAL HIGH (ref 0.61–1.24)
Creatinine, Ser: 6.03 mg/dL — ABNORMAL HIGH (ref 0.61–1.24)
GFR, Estimated: 11 mL/min — ABNORMAL LOW (ref >60)
GFR, Estimated: 10 mL/min — ABNORMAL LOW (ref >60)
Glucose, Bld: 147 mg/dL — ABNORMAL HIGH (ref 70–99)
Glucose, Bld: 138 mg/dL — ABNORMAL HIGH (ref 70–99)
Potassium: 6.3 mmol/L (ref 3.5–5.1)
Potassium: 6.4 mmol/L (ref 3.5–5.1)
Sodium: 134 mmol/L — ABNORMAL LOW (ref 135–145)
Sodium: 134 mmol/L — ABNORMAL LOW (ref 135–145)

## 2020-07-26 LAB — CBG MONITORING, ED: Glucose-Capillary: 119 mg/dL — ABNORMAL HIGH (ref 70–99)

## 2020-07-26 LAB — LACTIC ACID, PLASMA: Lactic Acid, Venous: 4.5 mmol/L (ref 0.5–1.9)

## 2020-07-26 LAB — PHOSPHORUS: Phosphorus: 9.1 mg/dL — ABNORMAL HIGH (ref 2.5–4.6)

## 2020-07-26 LAB — ETHANOL: Alcohol, Ethyl (B): <10 mg/dL (ref <10)

## 2020-07-26 LAB — MAGNESIUM: Magnesium: 1.9 mg/dL (ref 1.7–2.4)

## 2020-07-26 MED ORDER — SODIUM BICARBONATE 650 MG PO TABS
650.0000 mg | ORAL_TABLET | Freq: Two times a day (BID) | ORAL | Status: DC
  Administered 2020-07-26 (×2): 650 mg via ORAL
  Filled 2020-07-26 (×5): qty 1

## 2020-07-26 MED ORDER — GABAPENTIN 300 MG PO CAPS: 300.0000 mg | ORAL_CAPSULE | Freq: Every day | ORAL | Status: DC

## 2020-07-26 MED ORDER — SODIUM ZIRCONIUM CYCLOSILICATE 5 G PO PACK
5.0000 g | PACK | Freq: Once | ORAL | Status: AC
  Administered 2020-07-26: 5 g via ORAL
  Filled 2020-07-26: qty 1

## 2020-07-26 MED ORDER — CALCIUM GLUCONATE-NACL 1-0.675 GM/50ML-% IV SOLN
1.0000 g | Freq: Once | INTRAVENOUS | Status: AC
  Administered 2020-07-26: 1000 mg via INTRAVENOUS
  Filled 2020-07-26: qty 50

## 2020-07-26 MED ORDER — MAGNESIUM SULFATE 2 GM/50ML IV SOLN: 2.0000 g | Freq: Once | INTRAVENOUS | Status: DC

## 2020-07-26 MED ORDER — THIAMINE HCL 100 MG/ML IJ SOLN
100.0000 mg | Freq: Every day | INTRAMUSCULAR | Status: DC
  Administered 2020-07-27 – 2020-07-28 (×3): 100 mg via INTRAVENOUS
  Filled 2020-07-26 (×2): qty 2

## 2020-07-26 MED ORDER — SODIUM CHLORIDE 0.9 % IV BOLUS
500.0000 mL | Freq: Once | INTRAVENOUS | Status: AC
  Administered 2020-07-26: 500 mL via INTRAVENOUS

## 2020-07-26 MED ORDER — SODIUM ZIRCONIUM CYCLOSILICATE 10 G PO PACK
10.0000 g | PACK | Freq: Once | ORAL | Status: AC
  Administered 2020-07-26: 10 g via ORAL
  Filled 2020-07-26: qty 1

## 2020-07-26 MED ORDER — ALBUTEROL SULFATE (2.5 MG/3ML) 0.083% IN NEBU
2.5000 mg | INHALATION_SOLUTION | Freq: Once | RESPIRATORY_TRACT | Status: AC
  Administered 2020-07-26: 2.5 mg via RESPIRATORY_TRACT
  Filled 2020-07-26: qty 3

## 2020-07-26 NOTE — Progress Notes (Addendum)
PROGRESS NOTE  Bryan [name] TDS:[id number] DOB: March 07, 1956 DOA: 08/04/2020 PCP: Wynonia Hazard, NP  Brief History   64 year old man PMH including chronic abdominal pain followed by GI, diabetes mellitus type 2, NSTEMI March 2021, COPD with hypoxic respiratory failure chronic, presented with shortness of breath and abdominal pain.  However the.  Abdominal pain and COPD was at baseline, was admitted for acute kidney injury.   A & P  Acute kidney injury on CKD stage IIIa with anion gap metabolic acidosis --Etiology unclear.  Hypotensive on admission.  Dexilant and torsemide on hold.  Was just started on Dexilant so doubt contributory. --Output not reported.  Place Foley catheter.  Renal function worse today.  Now has a metabolic acidosis.  Discussed with nephrology, appreciate consultation.  The IV fluids.  Work-up as per nephrology.  Renal ultrasound no hydronephrosis.  FENa suggestive of prerenal state.  Sodium bicarbonate. --Elevated lactic acid.  No signs or symptoms of infection.  Abdominal exam benign.  Skin appears unremarkable.  Think related to acute metabolic acidosis.   --Serial BMP.  Hyperkalemia secondary to acute kidney injury --Repeat Lokelma.  Continue telemetry.  Hypomagnesemia --Repleted  Chronic abdominal pain, PMH GERD, PUD, severe grade D reflux esophagitis, dysphagia, PPE without motility issues, constipation --Gastroenterology and seen last 11/12 at which time outpatient EEG was planned.  Started on trial of Dexilant, Bentyl stopped, started on Levsin, continued on Linzess.  Was referred to vascular surgery for vascular abnormalities on MRI.  These abnormalities are not thought to be contributory to pain or represent chronic mesenteric ischemia. --lipase w/ trivial elevation --Appears stable.  COPD, emphysema, chronic hypoxic respiratory failure, WHO group 3 pulmonary hypertension (secondary to COPD and hypoxia), possible sleep apnea --CXR no acute disease, SpO2  stable on home oxygen.  Although subjectively worse, appears stable. --Continue trilogy, bronchodilators.  Enrolled in outpatient rehab.  Pulmonology arranging outpatient sleep study. --Goal SPO2 greater than 90%.  Continue 2 L oxygen 24/7. --Appears stable.  Diabetes mellitus type II w/ peripheral polyneuropathy hands/feet --CBG remained stable, no home meds listed, A1c 6.7 6/21 --no SSI as CBG stable, continue gabapentin  Disposition Plan:  Discussion: Chronically ill with worsening renal function of unclear etiology.  Appreciate nephrology involvement.  Treat hyperkalemia.  Monitor closely.  IV fluids.  Place Foley catheter.  Strict intake and output.  ADDENDUM Discussed w/ Dr. Johnney Ou, etiology is not clear; now with AG metabolic acidosis, ongoing hyperkalemia, plan Lokelma for hyperkalemia, IVF, serial BMP, r/o ingestions, supplemental bicarb. Lactic acid high but hemodynamics stable, no s/s of infection and no new symptoms. Re-examined this PM ~1800; chronic SOB, abdomen w/ upper tenderness w/o change, bilateral femoral pulses, unremarkable groin and skin, perineum unremarkable, lower extremities are well perfused. Will repeat BMP and lactic acid this PM and d/w night team.  Dispo: The patient is from: Home              Anticipated d/c is to: Home              Anticipated d/c date is: 3 days              Patient currently is not medically stable to d/c.  DVT prophylaxis: heparin injection 5,000 Units Start: 07/25/20 0600 SCDs Start: 07/25/20 0356   Code Status: DNR Family Communication: none  Murray Hodgkins, MD  [facility] Direct contact: see [email or web address] (further directions at bottom of note if needed) 7PM-7AM contact night coverage as at bottom of note 07/26/2020, 3:43 PM  LOS: 1 day   Significant Hospital Events   .    Consults:  .    Procedures:  .   Significant Diagnostic Tests:  . EKG SR no acute changes   Micro Data:  .    Antimicrobials:   .   Interval History/Subjective  CC: f/u abd pain  No new issues, still has some abdominal pain shortness of breath.  Reports urinating ok.  Some hesitancy.  Objective   Vitals:  Vitals:   07/26/20 1324 07/26/20 1434  BP:  (!) 101/57  Pulse:  63  Resp:  18  Temp: 97.7 F (36.5 C) 97.6 F (36.4 C)  SpO2:      Exam: Constitutional:   . Appears calm and comfortable, nontoxic Respiratory:  . CTA bilaterally, no w/r/r.  . Respiratory effort normal.  Cardiovascular:  . RRR, no m/r/g . No LE extremity edema   Abdomen:  . Soft, nd, minimal general tenderness Psychiatric:  . Mental status o Mood, affect appropriate  I have personally reviewed the following:   Today's Data  . K+ 6.4 > Lokelma . Creatinine 3.6 > 3.56 > 5.61 . Mg 1.9 . AG 17  Scheduled Meds: . umeclidinium bromide  1 puff Inhalation Daily   And  . fluticasone furoate-vilanterol  1 puff Inhalation Daily  . [START ON 07/27/2020] gabapentin  300 mg Oral QHS  . guaiFENesin  600 mg Oral BID  . heparin  5,000 Units Subcutaneous Q8H  . influenza vac split quadrivalent PF  0.5 mL Intramuscular Tomorrow-1000  . metoprolol tartrate  12.5 mg Oral BID  . rosuvastatin  10 mg Oral Daily  . sodium bicarbonate  650 mg Oral BID   Continuous Infusions: . sodium chloride 150 mL/hr at 07/26/20 1203    Principal Problem:   AKI (acute kidney injury) ([place]) Active Problems:   COPD (chronic obstructive pulmonary disease) (HCC)   Chronic abdominal pain   Type 2 diabetes mellitus with hyperlipidemia (HCC)   CKD (chronic kidney disease) stage 3, GFR 30-59 ml/min (HCC)   Chronic respiratory failure with hypoxia (HCC)   Hypomagnesemia   Diabetic peripheral neuropathy ([place])   LOS: 1 day   How to contact the [facility] Attending or Consulting provider 7A - 7P or covering provider during after hours [place], for this patient?  1. Check the care team in [facility] and look for a) attending/consulting TRH provider listed and b) the [facility]  team listed 2. Log into [email or web address] and use [place]'s universal password to access. If you do not have the password, please contact the hospital operator. 3. Locate the [facility] provider you are looking for under [facility] and page to a number that you can be directly reached. 4. If you still have difficulty reaching the provider, please page the [facility] (Director on Call) for the Hospitalists listed on amion for assistance.

## 2020-07-26 NOTE — ED Notes (Signed)
CRITICAL VALUE ALERT  Critical Value:  Potassium 6.4   Date & Time Notied:  07/26/20 & 1315hrs  Provider Notified: Dr. Sarajane Jews  Orders Received/Actions taken: notified

## 2020-07-26 NOTE — ED Notes (Signed)
Report received from [place], [place].

## 2020-07-26 NOTE — Progress Notes (Signed)
MD informed of LA of 4.5, orders given

## 2020-07-26 NOTE — ED Notes (Signed)
Date and time results received: 07/26/20  Test: K Critical Value: 6.3  Name of Provider Notified: Dr. Sarajane Jews paged  Orders Received? Or Actions Taken?: Action taken.

## 2020-07-26 NOTE — Consult Note (Signed)
[facility]  INPATIENT CONSULTATION  Reason for Consultation: AKI Requesting Provider: Dr. Sarajane Jews  HPI: Bryan [name] is an 64 y.o. male with h/o CVA, DM (says dx 1y, no insulin), NSTEMI 11/2019, HTN, COPD on 2.5L [place] at home, GERD, h/o tubular adenoma who is seen for evaluation and management of AKI.   Presented to [facility] ED with abdominal pain x 1 year, recently worsening. GI has been evaluating this outpatient.  On labs in the ED he was found to have AKI with Cr 3.6 prompting the admission.  Baseline as of summer 2021 appears to be 1.5-30m/dL; on labs 05/14/20 Cr 2.46 and here 11/12 3.6 which has trended up to 5.6 this morning. K 6.3 treated with lokelma.  He is being hydrated with isotonic fluids.   ED BPs 76/66 per H&P. BPs now in the 100-120s.  UA yesterday negative flor blood and 1+ protein consistent with prior UAs.  No LE, nitrites or WBC noted on microscopy.  He had a urine sodium of 11 and FeNa calc to be 0.2%.  Renal UKoreawith R 9.2,  L 8.5cm, mildly echogenic cortices. No obstruction.  Had MRI abd last week which showed patent renal arteries.  No iodinated contrast exposures.  NSAIDs - none.  PPI (started only this week) and torsemide have been held due to AKI. No hemoptysis, rashes, no hematuria or tea colored urine.    Wt fairly stable recently around 120lbs. Never heavy.   Lives with son, disabled from back operations, worked at [employer].  No fam Hx CKD/ESRD. DNR.   PMH: Past Medical History:  Diagnosis Date  . Abdominal pain   . Acid reflux   . Arthritis   . Asthma   . Chest pain   . Chronic pain    with leg and back pain (disc problem)  . COPD (ch[place] obstructive pulmonary disease) ([place]   . Diabetes mellitus without complication ([place]   . Diabetic peripheral neuropathy ([place] 07/25/2020  . Epigastric pain   . Headache    HX OF  . History of upper GI x-ray series    to follow showed large duodenal ulcer H pylori serologies were negative   . Hypertension   . NSTEMI (non-ST elevated myocardial infarction) ([place] 11/2019  . Pneumonia    07/17/17  . Pre-diabetes   . Stroke ([facility]    TIA MINI STROKE  . Tubular adenoma    PSH: Past Surgical History:  Procedure Laterality Date  . BACK SURGERY  7/05; 5/09    Dr.Hirsch,3 lumbar  X3  . BACK SURGERY    . COLONOSCOPY  Feb 2012   Dr. RGala Romney normal rectum, pedunculate polyp removed but not recovered  . COLONOSCOPY WITH ESOPHAGOGASTRODUODENOSCOPY (EGD) N/A 11/18/2013   Dr.Rourk- tcs= normal rectum, multipe polyps about the ileocecal valve and distal transverse colon o/w the remainder of the colonic mucosa appeared normal bx= tubular adenoma. EGD= normal esophagus, stomach with scattered erosions mottling, friablility, no ulcer or infiltrating process patent pylorus bx= chronic inflammation. next TCS 11/2016  . COLONOSCOPY WITH PROPOFOL N/A 05/25/2017   Dr. RGala Romney sigmoid diverticulosis, one 4 mm hyerplastic rectal polyp, ascending colonic AVMs surveillance 2023  . COLONOSCOPY WITH PROPOFOL N/A 04/16/2020   Procedure: COLONOSCOPY WITH PROPOFOL;  Surgeon: RDaneil Dolin MD; 5 mm tubular adenoma in the rectum, otherwise normal exam.  . ESOPHAGOGASTRODUODENOSCOPY  1/06   Dr. RVolney Americanesophageal erosions,U-shaped stomach,marked erosions and edema of the bulb without discrete ulcer disease.   . ESOPHAGOGASTRODUODENOSCOPY (  EGD) WITH PROPOFOL N/A 05/25/2017   Dr. Gala Romney: reflux esophagitis s/p empiric dilation, normal stomach and duodenum  . ESOPHAGOGASTRODUODENOSCOPY (EGD) WITH PROPOFOL Left 11/30/2019   LA Grade D esophagitis, small hiatal hernia, one non-bleeding cratered gastric ulcer in prepyloric region of stomach, many non-bleeding cratered duodenal ulcers without stigmata of bleeding, mild luminal narrowing at apex of duodenal bulb but traversed easily. H.pylori serology negative.  . ESOPHAGOGASTRODUODENOSCOPY (EGD) WITH PROPOFOL N/A 04/16/2020   Procedure: ESOPHAGOGASTRODUODENOSCOPY (EGD)  WITH PROPOFOL;  Surgeon: Daneil Dolin, MD; normal esophagus, normal stomach, patent pylorus, normal examined duodenum, previously noted PUD and reflux esophagitis healed.   Marland Kitchen MALONEY DILATION N/A 05/25/2017   Procedure: Venia Minks DILATION;  Surgeon: Daneil Dolin, MD;  Location[facility];  Service: Endoscopy;  Laterality: N/A;  . NECK SURGERY  10/2007   PLATE IN NECK  . POLYPECTOMY  05/25/2017   Procedure: POLYPECTOMY;  Surgeon: Daneil Dolin, MD;  Location: [facility];  Service: Endoscopy;;  rectal  . RIGHT/LEFT HEART CATH AND CORONARY ANGIOGRAPHY N/A 12/03/2019   Procedure: RIGHT/LEFT HEART CATH AND CORONARY ANGIOGRAPHY;  Surgeon: Nigel Mormon, MD;  Location: [place] CV LAB;  Service: Cardiovascular;  Laterality: N/A;  . TRANSURETHRAL RESECTION OF PROSTATE N/A 08/17/2017   Procedure: TRANSURETHRAL RESECTION OF THE PROSTATE (TURP);  Surgeon: Irine Seal, MD;  Location: [facility] ORS;  Service: Urology;  Laterality: N/A;    Past Medical History:  Diagnosis Date  . Abdominal pain   . Acid reflux   . Arthritis   . Asthma   . Chest pain   . Chronic pain    with leg and back pain (disc problem)  . COPD (chronic obstructive pulmonary disease) ([place])   . Diabetes mellitus without complication ([place])   . Diabetic peripheral neuropathy ([place]) 07/25/2020  . Epigastric pain   . Headache    HX OF  . History of upper GI x-ray series    to follow showed large duodenal ulcer H pylori serologies were negative  . Hypertension   . NSTEMI (non-ST elevated myocardial infarction) ([place]) 11/2019  . Pneumonia    07/17/17  . Pre-diabetes   . Stroke [facility])    TIA MINI STROKE  . Tubular adenoma     Medications:  I have reviewed the patient's current medications.  (Not in a hospital admission)   ALLERGIES:  No Known Allergies  FAM HX: Family History  Problem Relation Age of Onset  . Cancer Father   . Asthma Mother   . Colon cancer Neg Hx     Social History:   reports that he  quit smoking about 5 years ago. His smoking use included cigarettes. He has a 20.00 pack-year smoking history. He has never used smokeless tobacco. He reports previous drug use. He reports that he does not drink alcohol.  ROS: 12 system ROS per HPI above  Blood pressure 114/71, pulse 63, temperature (!) 97.5 F (36.4 C), temperature source Oral, resp. rate 20, height _0  (1.6 m), weight 54 kg, SpO2 96 %. PHYSICAL EXAM: Gen: eating lunch of chicken and vegetables  Eyes: anicteric ENT: MMM  Neck: supple CV: RRR Abd: soft, mildly TTP epigastrium Back: Clear lungs with normal WOB GU: foley with scant amber urine Extr: no edema Neuro: nonfocal, no asterixis Skin: no rashes   Results for orders placed or performed during the hospital encounter of 07/19/2020 (from the past 48 hour(s))  Respiratory Panel by RT PCR (Flu A&B, Covid) - Nasopharyngeal Swab  Status: None   Collection Time: 07/27/2020 10:22 PM   Specimen: Nasopharyngeal Swab  Result Value Ref Range   SARS Coronavirus 2 by RT PCR NEGATIVE NEGATIVE    Comment: (NOTE) SARS-CoV-2 target nucleic acids are NOT DETECTED.  The SARS-CoV-2 RNA is generally detectable in upper respiratoy specimens during the acute phase of infection. The lowest concentration of SARS-CoV-2 viral copies this assay can detect is 131 copies/mL. A negative result does not preclude SARS-Cov-2 infection and should not be used as the sole basis for treatment or other patient management decisions. A negative result may occur with  improper specimen collection/handling, submission of specimen other than nasopharyngeal swab, presence of viral mutation(s) within the areas targeted by this assay, and inadequate number of viral copies (<131 copies/mL). A negative result must be combined with clinical observations, patient history, and epidemiological information. The expected result is Negative.  Fact Sheet for Patients:   PinkCheek.be  Fact Sheet for Healthcare Providers:  GravelBags.it  This test is no t yet approved or cleared by the [place] FDA and  has been authorized for detection and/or diagnosis of SARS-CoV-2 by FDA under an Emergency Use Authorization (EUA). This EUA will remain  in effect (meaning this test can be used) for the duration of the COVID-19 declaration under Section 564(b)(1) of the Act, 21 U.S.C. section 360bbb-3(b)(1), unless the authorization is terminated or revoked sooner.     Influenza A by PCR NEGATIVE NEGATIVE   Influenza B by PCR NEGATIVE NEGATIVE    Comment: (NOTE) The Xpert Xpress SARS-CoV-2/FLU/RSV assay is intended as an aid in  the diagnosis of influenza from Nasopharyngeal swab specimens and  should not be used as a sole basis for treatment. Nasal washings and  aspirates are unacceptable for Xpert Xpress SARS-CoV-2/FLU/RSV  testing.  Fact Sheet for Patients: PinkCheek.be  Fact Sheet for Healthcare Providers: GravelBags.it  This test is not yet approved or cleared by the [place] FDA and  has been authorized for detection and/or diagnosis of SARS-CoV-2 by  FDA under an Emergency Use Authorization (EUA). This EUA will remain  in effect (meaning this test can be used) for the duration of the  Covid-19 declaration under Section 564(b)(1) of the Act, 21  U.S.C. section 360bbb-3(b)(1), unless the authorization is  terminated or revoked. Performed at [facility], [address], [place], [place] [address]   Comprehensive metabolic panel     Status: Abnormal   Collection Time: 08/03/2020 11:44 PM  Result Value Ref Range   Sodium 132 (L) 135 - 145 mmol/L   Potassium 4.9 3.5 - 5.1 mmol/L   Chloride 96 (L) 98 - 111 mmol/L   CO2 25 22 - 32 mmol/L   Glucose, Bld 119 (H) 70 - 99 mg/dL    Comment: Glucose reference range applies only to samples  taken after fasting for at least 8 hours.   BUN 67 (H) 8 - 23 mg/dL   Creatinine, Ser 3.60 (H) 0.61 - 1.24 mg/dL   Calcium 8.2 (L) 8.9 - 10.3 mg/dL   Total Protein 7.6 6.5 - 8.1 g/dL   Albumin 4.0 3.5 - 5.0 g/dL   AST 33 15 - 41 U/L   ALT 29 0 - 44 U/L   Alkaline Phosphatase 90 38 - 126 U/L   Total Bilirubin 0.5 0.3 - 1.2 mg/dL   GFR, Estimated 18 (L) >60 mL/min    Comment: (NOTE) Calculated using the CKD-EPI Creatinine Equation (2021)    Anion gap 11 5 - 15  Comment: Performed at [facility], [address] [place], [place] [address]  Lipase, blood     Status: Abnormal   Collection Time: 07/26/2020 11:44 PM  Result Value Ref Range   Lipase 61 (H) 11 - 51 U/L    Comment: Performed at [facility], [address] [place], [place] [address]  Troponin I (High Sensitivity)     Status: None   Collection Time: 07/30/2020 11:44 PM  Result Value Ref Range   Troponin I (High Sensitivity) 13 <18 ng/L    Comment: (NOTE) Elevated high sensitivity troponin I (hsTnI) values and significant  changes across serial measurements may suggest ACS but many other  chronic and acute conditions are known to elevate hsTnI results.  Refer to the "Links" section for chest pain algorithms and additional  guidance. Performed at [facility], [address], [place], [place] [address]   CBC with Differential     Status: Abnormal   Collection Time: 08/10/2020 11:44 PM  Result Value Ref Range   WBC 6.5 4.0 - 10.5 K/uL   RBC 5.38 4.22 - 5.81 MIL/uL   Hemoglobin 12.2 (L) 13.0 - 17.0 g/dL   HCT 41.8 39 - 52 %   MCV 77.7 (L) 80.0 - 100.0 fL   MCH 22.7 (L) 26.0 - 34.0 pg   MCHC 29.2 (L) 30.0 - 36.0 g/dL   RDW 18.8 (H) 11.5 - 15.5 %   Platelets 348 150 - 400 K/uL   nRBC 0.3 (H) 0.0 - 0.2 %   Neutrophils Relative % 64 %   Neutro Abs 4.1 1.7 - 7.7 K/uL   Lymphocytes Relative 23 %   Lymphs Abs 1.5 0.7 - 4.0 K/uL   Monocytes Relative 10 %   Monocytes Absolute 0.7 0.1 - 1.0 K/uL   Eosinophils Relative 2 %    Eosinophils Absolute 0.2 0.0 - 0.5 K/uL   Basophils Relative 1 %   Basophils Absolute 0.1 0.0 - 0.1 K/uL   Immature Granulocytes 0 %   Abs Immature Granulocytes 0.02 0.00 - 0.07 K/uL    Comment: Performed at [facility], [address], [place], [place] [address]  Troponin I (High Sensitivity)     Status: None   Collection Time: 07/25/20  1:15 AM  Result Value Ref Range   Troponin I (High Sensitivity) 11 <18 ng/L    Comment: (NOTE) Elevated high sensitivity troponin I (hsTnI) values and significant  changes across serial measurements may suggest ACS but many other  chronic and acute conditions are known to elevate hsTnI results.  Refer to the "Links" section for chest pain algorithms and additional  guidance. Performed at [facility], [address] [place], [place] [address]   Urinalysis, Routine w reflex microscopic Urine, Clean Catch     Status: Abnormal   Collection Time: 07/25/20  3:56 AM  Result Value Ref Range   Color, Urine YELLOW YELLOW   APPearance HAZY (A) CLEAR   Specific Gravity, Urine 1.012 1.005 - 1.030   pH 5.0 5.0 - 8.0   Glucose, UA NEGATIVE NEGATIVE mg/dL   Hgb urine dipstick NEGATIVE NEGATIVE   Bilirubin Urine NEGATIVE NEGATIVE   Ketones, ur NEGATIVE NEGATIVE mg/dL   Protein, ur 30 (A) NEGATIVE mg/dL   Nitrite NEGATIVE NEGATIVE   Leukocytes,Ua NEGATIVE NEGATIVE   RBC / HPF 0-5 0 - 5 RBC/hpf   WBC, UA 0-5 0 - 5 WBC/hpf   Bacteria, UA RARE (A) NONE SEEN   Squamous Epithelial / LPF 0-5 0 - 5  Mucus PRESENT    Hyaline Casts, UA PRESENT    Sperm, UA PRESENT     Comment: Performed at [facility], [address] [place], [place] [address]  Blood gas, venous     Status: Abnormal   Collection Time: 07/25/20  4:12 AM  Result Value Ref Range   FIO2 32.00    pH, Ven 7.150 (LL) 7.25 - 7.43    Comment: CRITICAL RESULT CALLED TO, READ BACK BY AND VERIFIED WITH: THOMPSON,R @ 0453 ON 07/25/20 BY JUW    pCO2, Ven 76.6 (HH) 44 - 60 mmHg    Comment: CRITICAL RESULT  CALLED TO, READ BACK BY AND VERIFIED WITH: THOMPSON,R @ 0453 ON 07/25/20 BY JUW    pO2, Ven 37.5 32 - 45 mmHg   Bicarbonate 19.8 (L) 20.0 - 28.0 mmol/L   Acid-base deficit 2.3 (H) 0.0 - 2.0 mmol/L   O2 Saturation 45.5 %   Patient temperature 37.0     Comment: Performed at [facility], [address] [place], [place] [address]  Comprehensive metabolic panel     Status: Abnormal   Collection Time: 07/25/20  4:12 AM  Result Value Ref Range   Sodium 132 (L) 135 - 145 mmol/L   Potassium 6.2 (H) 3.5 - 5.1 mmol/L    Comment: DELTA CHECK NOTED   Chloride 97 (L) 98 - 111 mmol/L   CO2 21 (L) 22 - 32 mmol/L   Glucose, Bld 84 70 - 99 mg/dL    Comment: Glucose reference range applies only to samples taken after fasting for at least 8 hours.   BUN 69 (H) 8 - 23 mg/dL   Creatinine, Ser 3.56 (H) 0.61 - 1.24 mg/dL   Calcium 8.0 (L) 8.9 - 10.3 mg/dL   Total Protein 7.4 6.5 - 8.1 g/dL   Albumin 3.9 3.5 - 5.0 g/dL   AST 41 15 - 41 U/L   ALT 34 0 - 44 U/L   Alkaline Phosphatase 86 38 - 126 U/L   Total Bilirubin 0.6 0.3 - 1.2 mg/dL   GFR, Estimated 18 (L) >60 mL/min    Comment: (NOTE) Calculated using the CKD-EPI Creatinine Equation (2021)    Anion gap 14 5 - 15    Comment: Performed at [facility], [address], [place], [place] [address]  Magnesium     Status: Abnormal   Collection Time: 07/25/20  4:12 AM  Result Value Ref Range   Magnesium 1.4 (L) 1.7 - 2.4 mg/dL    Comment: Performed at [facility], [address], [place], [place] [address]  CBC WITH DIFFERENTIAL     Status: Abnormal   Collection Time: 07/25/20  4:12 AM  Result Value Ref Range   WBC 6.8 4.0 - 10.5 K/uL   RBC 5.17 4.22 - 5.81 MIL/uL   Hemoglobin 11.5 (L) 13.0 - 17.0 g/dL   HCT 41.0 39 - 52 %   MCV 79.3 (L) 80.0 - 100.0 fL   MCH 22.2 (L) 26.0 - 34.0 pg   MCHC 28.0 (L) 30.0 - 36.0 g/dL   RDW 18.3 (H) 11.5 - 15.5 %   Platelets 358 150 - 400 K/uL   nRBC 0.3 (H) 0.0 - 0.2 %   Neutrophils Relative % 80 %   Neutro Abs  5.4 1.7 - 7.7 K/uL   Lymphocytes Relative 10 %   Lymphs Abs 0.7 0.7 - 4.0 K/uL   Monocytes Relative 8 %   Monocytes Absolute 0.6 0.1 - 1.0 K/uL   Eosinophils Relative  0 %   Eosinophils Absolute 0.0 0.0 - 0.5 K/uL   Basophils Relative 1 %   Basophils Absolute 0.1 0.0 - 0.1 K/uL   Immature Granulocytes 1 %   Abs Immature Granulocytes 0.04 0.00 - 0.07 K/uL    Comment: Performed at [facility], [address] [place], [place] [address]  Creatinine, urine, random     Status: None   Collection Time: 07/25/20  9:45 AM  Result Value Ref Range   Creatinine, Urine 176.43 mg/dL    Comment: Performed at [facility], [address], [place], [place] [address]  Sodium, urine, random     Status: None   Collection Time: 07/25/20  9:45 AM  Result Value Ref Range   Sodium, Ur 11 mmol/L    Comment: Performed at [facility], [address] [place], [place] [address]  Basic metabolic panel     Status: Abnormal   Collection Time: 07/26/20  4:19 AM  Result Value Ref Range   Sodium 134 (L) 135 - 145 mmol/L   Potassium 6.3 (HH) 3.5 - 5.1 mmol/L    Comment: CRITICAL RESULT CALLED TO, READ BACK BY AND VERIFIED WITH: MICHA MONTGOMERY,RN_0  07/26/2020 KAY    Chloride 96 (L) 98 - 111 mmol/L   CO2 21 (L) 22 - 32 mmol/L   Glucose, Bld 147 (H) 70 - 99 mg/dL    Comment: Glucose reference range applies only to samples taken after fasting for at least 8 hours.   BUN 93 (H) 8 - 23 mg/dL   Creatinine, Ser 5.61 (H) 0.61 - 1.24 mg/dL   Calcium 7.3 (L) 8.9 - 10.3 mg/dL   GFR, Estimated 11 (L) >60 mL/min    Comment: (NOTE) Calculated using the CKD-EPI Creatinine Equation (2021)    Anion gap 17 (H) 5 - 15    Comment: Performed at [facility], [address] [place], [place] [address]  Magnesium     Status: None   Collection Time: 07/26/20  4:19 AM  Result Value Ref Range   Magnesium 1.9 1.7 - 2.4 mg/dL    Comment: Performed at [facility], [address], [place], [place] [address]  Phosphorus     Status:  Abnormal   Collection Time: 07/26/20  4:19 AM  Result Value Ref Range   Phosphorus 9.1 (H) 2.5 - 4.6 mg/dL    Comment: Performed at [facility], [address] [place], [place] [address]    US RENAL  Result Date: 07/25/2020 CLINICAL DATA:  Acute kidney injury, abdominal pain EXAM: RENAL / URINARY TRACT ULTRASOUND COMPLETE COMPARISON:  Recent abdominal MRI 07/17/2020 FINDINGS: Right Kidney: Renal measurements: 9.2 x 4.4 x 5.2 cm = volume: 110 mL. The parenchyma is mildly echogenic. There is increased conspicuity between the corticomedullary interface. No mass or hydronephrosis visualized. Left Kidney: Renal measurements: 8.5 x 4.9 x 4.8 cm = volume: 105 mL. The parenchyma is mildly echogenic. There is increased conspicuity between the corticomedullary interface. No mass or hydronephrosis visualized. Bladder: Appears normal for degree of bladder distention. Other: None. IMPRESSION: 1. No evidence of hydronephrosis. 2. Mildly echogenic renal parenchyma suggests underlying medical renal disease. Electronically Signed   By: Jacqulynn Cadet M.D.   On: 07/25/2020 10:45   DG Chest Portable 1 View  Result Date: 08/05/2020 CLINICAL DATA:  Chest pain, shortness of breath EXAM: PORTABLE CHEST 1 VIEW COMPARISON:  06/23/2020 FINDINGS: Single frontal view of the chest demonstrates a stable cardiac silhouette. Background emphysema unchanged, without airspace disease, effusion, or pneumothorax. Continued prominence  of the hilar vasculature may reflect underlying pulmonary arterial hypertension. IMPRESSION: 1. Emphysema, no acute intrathoracic process. Electronically Signed   By: Randa Ngo M.D.   On: 07/14/2020 22:32    Assessment/Plan **AKI on CKD:  Baseline Cr 1.5-2 but on presentation 3.6 and trended up to 5.6 over past 36h.  Was hypotensive on presentation with BP low enough to cause ischemic ATN + likely component of hypovolemia/prerenal with FeNa.  No evidence for AIN or glomerulonephritis on UA -  does have proteinuria on dipstick (though has had in past).  Imaging without obstruction.  Will quantify with UP/C can send serologic w/u and consider biopsy.  For now will continue IV fluids and trend renal function.  If pt develops indication for dialysis he would consent.   **Hyperkalemia:  K 6.3, tx with lokelma 5g at 7am, rechecking BMP now.   **Resp Acidosis + AGMA:  Presumably secondary to COPD + AKI but will r/u ingestions for completeness.  Supplemental PO bicarb.    **DM complicated by neuropathy: Says diet controlled at home;  insulin per primary, dec gabapentin from 300 BID to qhs given renal function.   Will follow.   Justin Mend 07/26/2020, 12:13 PM

## 2020-07-27 ENCOUNTER — Inpatient Hospital Stay (HOSPITAL_COMMUNITY): Payer: Medicare Other

## 2020-07-27 ENCOUNTER — Other Ambulatory Visit: Payer: Self-pay | Admitting: Interventional Radiology

## 2020-07-27 DIAGNOSIS — N1831 Chronic kidney disease, stage 3a: Secondary | ICD-10-CM | POA: Diagnosis not present

## 2020-07-27 DIAGNOSIS — N179 Acute kidney failure, unspecified: Secondary | ICD-10-CM | POA: Diagnosis not present

## 2020-07-27 DIAGNOSIS — J9611 Chronic respiratory failure with hypoxia: Secondary | ICD-10-CM | POA: Diagnosis not present

## 2020-07-27 DIAGNOSIS — R109 Unspecified abdominal pain: Secondary | ICD-10-CM | POA: Diagnosis not present

## 2020-07-27 HISTORY — PX: IR US GUIDE VASC ACCESS RIGHT: IMG2390

## 2020-07-27 HISTORY — PX: IR FLUORO GUIDE CV LINE RIGHT: IMG2283

## 2020-07-27 LAB — RENAL FUNCTION PANEL
Albumin: 3.5 g/dL (ref 3.5–5.0)
Albumin: 3.3 g/dL — ABNORMAL LOW (ref 3.5–5.0)
Anion gap: 16 — ABNORMAL HIGH (ref 5–15)
Anion gap: 14 (ref 5–15)
BUN: 108 mg/dL — ABNORMAL HIGH (ref 8–23)
BUN: 108 mg/dL — ABNORMAL HIGH (ref 8–23)
CO2: 22 mmol/L (ref 22–32)
CO2: 23 mmol/L (ref 22–32)
Calcium: 6.5 mg/dL — ABNORMAL LOW (ref 8.9–10.3)
Calcium: 6.7 mg/dL — ABNORMAL LOW (ref 8.9–10.3)
Chloride: 93 mmol/L — ABNORMAL LOW (ref 98–111)
Chloride: 96 mmol/L — ABNORMAL LOW (ref 98–111)
Creatinine, Ser: 7.29 mg/dL — ABNORMAL HIGH (ref 0.61–1.24)
Creatinine, Ser: 7.58 mg/dL — ABNORMAL HIGH (ref 0.61–1.24)
GFR, Estimated: 8 mL/min — ABNORMAL LOW (ref >60)
GFR, Estimated: 7 mL/min — ABNORMAL LOW (ref >60)
Glucose, Bld: 245 mg/dL — ABNORMAL HIGH (ref 70–99)
Glucose, Bld: 176 mg/dL — ABNORMAL HIGH (ref 70–99)
Phosphorus: 9 mg/dL — ABNORMAL HIGH (ref 2.5–4.6)
Phosphorus: 8.6 mg/dL — ABNORMAL HIGH (ref 2.5–4.6)
Potassium: 6.2 mmol/L — ABNORMAL HIGH (ref 3.5–5.1)
Potassium: 5.8 mmol/L — ABNORMAL HIGH (ref 3.5–5.1)
Sodium: 131 mmol/L — ABNORMAL LOW (ref 135–145)
Sodium: 133 mmol/L — ABNORMAL LOW (ref 135–145)

## 2020-07-27 LAB — CBC
HCT: 33.5 % — ABNORMAL LOW (ref 39.0–52.0)
Hemoglobin: 9.9 g/dL — ABNORMAL LOW (ref 13.0–17.0)
MCH: 22.9 pg — ABNORMAL LOW (ref 26.0–34.0)
MCHC: 29.6 g/dL — ABNORMAL LOW (ref 30.0–36.0)
MCV: 77.5 fL — ABNORMAL LOW (ref 80.0–100.0)
Platelets: 242 10*3/uL (ref 150–400)
RBC: 4.32 MIL/uL (ref 4.22–5.81)
RDW: 18.4 % — ABNORMAL HIGH (ref 11.5–15.5)
WBC: 10.6 10*3/uL — ABNORMAL HIGH (ref 4.0–10.5)
nRBC: 2.2 % — ABNORMAL HIGH (ref 0.0–0.2)

## 2020-07-27 LAB — GLUCOSE, CAPILLARY
Glucose-Capillary: 121 mg/dL — ABNORMAL HIGH (ref 70–99)
Glucose-Capillary: 173 mg/dL — ABNORMAL HIGH (ref 70–99)
Glucose-Capillary: 119 mg/dL — ABNORMAL HIGH (ref 70–99)
Glucose-Capillary: 131 mg/dL — ABNORMAL HIGH (ref 70–99)
Glucose-Capillary: 114 mg/dL — ABNORMAL HIGH (ref 70–99)

## 2020-07-27 LAB — BLOOD GAS, VENOUS
Acid-base deficit: 7.7 mmol/L — ABNORMAL HIGH (ref 0.0–2.0)
Bicarbonate: 16.1 mmol/L — ABNORMAL LOW (ref 20.0–28.0)
FIO2: 40
O2 Saturation: 27.2 %
Patient temperature: 37
pCO2, Ven: 65.8 mmHg — ABNORMAL HIGH (ref 44.0–60.0)
pH, Ven: 7.117 — CL (ref 7.250–7.430)
pO2, Ven: <31 mmHg — CL (ref 32.0–45.0)

## 2020-07-27 LAB — BASIC METABOLIC PANEL
Anion gap: 17 — ABNORMAL HIGH (ref 5–15)
BUN: 104 mg/dL — ABNORMAL HIGH (ref 8–23)
CO2: 18 mmol/L — ABNORMAL LOW (ref 22–32)
Calcium: 6.8 mg/dL — ABNORMAL LOW (ref 8.9–10.3)
Chloride: 97 mmol/L — ABNORMAL LOW (ref 98–111)
Creatinine, Ser: 7.27 mg/dL — ABNORMAL HIGH (ref 0.61–1.24)
GFR, Estimated: 8 mL/min — ABNORMAL LOW (ref >60)
Glucose, Bld: 143 mg/dL — ABNORMAL HIGH (ref 70–99)
Potassium: 6.8 mmol/L (ref 3.5–5.1)
Sodium: 132 mmol/L — ABNORMAL LOW (ref 135–145)

## 2020-07-27 LAB — HEPATITIS B SURFACE ANTIBODY,QUALITATIVE: Hep B S Ab: NONREACTIVE

## 2020-07-27 LAB — HEPATITIS B CORE ANTIBODY, TOTAL: Hep B Core Total Ab: NONREACTIVE

## 2020-07-27 LAB — HEPATITIS B SURFACE ANTIGEN: Hepatitis B Surface Ag: NONREACTIVE

## 2020-07-27 LAB — LACTIC ACID, PLASMA: Lactic Acid, Venous: 3.7 mmol/L (ref 0.5–1.9)

## 2020-07-27 MED ORDER — SODIUM POLYSTYRENE SULFONATE 15 GM/60ML PO SUSP
30.0000 g | ORAL | Status: AC
  Administered 2020-07-27: 30 g via RECTAL
  Filled 2020-07-27: qty 120

## 2020-07-27 MED ORDER — CALCIUM GLUCONATE-NACL 1-0.675 GM/50ML-% IV SOLN
1.0000 g | Freq: Once | INTRAVENOUS | Status: AC
  Administered 2020-07-27: 1000 mg via INTRAVENOUS
  Filled 2020-07-27: qty 50

## 2020-07-27 MED ORDER — CHLORHEXIDINE GLUCONATE CLOTH 2 % EX PADS
6.0000 | MEDICATED_PAD | Freq: Every day | CUTANEOUS | Status: DC
  Administered 2020-07-27 – 2020-07-28 (×2): 6 via TOPICAL

## 2020-07-27 MED ORDER — LIDOCAINE HCL (PF) 1 % IJ SOLN: 5.0000 mL | INTRAMUSCULAR | Status: DC | PRN

## 2020-07-27 MED ORDER — SODIUM ZIRCONIUM CYCLOSILICATE 10 G PO PACK: 10.0000 g | PACK | Freq: Every day | ORAL | Status: DC

## 2020-07-27 MED ORDER — DEXTROSE 50 % IV SOLN
1.0000 | Freq: Once | INTRAVENOUS | Status: AC
  Administered 2020-07-27: 50 mL via INTRAVENOUS
  Filled 2020-07-27: qty 50

## 2020-07-27 MED ORDER — STERILE WATER FOR INJECTION IV SOLN
INTRAVENOUS | Status: DC
  Filled 2020-07-27 (×2): qty 850

## 2020-07-27 MED ORDER — ALBUTEROL SULFATE (2.5 MG/3ML) 0.083% IN NEBU
10.0000 mg | INHALATION_SOLUTION | Freq: Once | RESPIRATORY_TRACT | Status: AC
  Administered 2020-07-27: 2.5 mg via RESPIRATORY_TRACT
  Filled 2020-07-27: qty 12

## 2020-07-27 MED ORDER — ALTEPLASE 2 MG IJ SOLR: 2.0000 mg | Freq: Once | INTRAMUSCULAR | Status: DC | PRN

## 2020-07-27 MED ORDER — PENTAFLUOROPROP-TETRAFLUOROETH EX AERO: 1.0000 "application " | INHALATION_SPRAY | CUTANEOUS | Status: DC | PRN

## 2020-07-27 MED ORDER — LIDOCAINE-PRILOCAINE 2.5-2.5 % EX CREA: 1.0000 "application " | TOPICAL_CREAM | CUTANEOUS | Status: DC | PRN

## 2020-07-27 MED ORDER — INSULIN ASPART 100 UNIT/ML IV SOLN
10.0000 [IU] | Freq: Once | INTRAVENOUS | Status: AC
  Administered 2020-07-27: 10 [IU] via INTRAVENOUS

## 2020-07-27 MED ORDER — HEPARIN SODIUM (PORCINE) 1000 UNIT/ML IJ SOLN
INTRAMUSCULAR | Status: AC
  Filled 2020-07-27: qty 1

## 2020-07-27 MED ORDER — DEXTROSE 10 % IV SOLN: Freq: Once | INTRAVENOUS | Status: AC

## 2020-07-27 MED ORDER — LIDOCAINE HCL 1 % IJ SOLN
INTRAMUSCULAR | Status: AC
  Filled 2020-07-27: qty 20

## 2020-07-27 MED ORDER — HEPARIN SODIUM (PORCINE) 1000 UNIT/ML DIALYSIS
1000.0000 [IU] | INTRAMUSCULAR | Status: DC | PRN
  Filled 2020-07-27: qty 1

## 2020-07-27 MED ORDER — LIDOCAINE-EPINEPHRINE 1 %-1:100000 IJ SOLN
INTRAMUSCULAR | Status: DC | PRN
  Administered 2020-07-27: 10 mL

## 2020-07-27 MED ORDER — SODIUM BICARBONATE 8.4 % IV SOLN
INTRAVENOUS | Status: AC
  Filled 2020-07-27: qty 50

## 2020-07-27 MED ORDER — SODIUM CHLORIDE 0.9 % IV SOLN: 100.0000 mL | INTRAVENOUS | Status: DC | PRN

## 2020-07-27 MED ORDER — LIDOCAINE-EPINEPHRINE 1 %-1:100000 IJ SOLN
INTRAMUSCULAR | Status: AC
  Filled 2020-07-27: qty 1

## 2020-07-27 MED ORDER — HEPARIN SODIUM (PORCINE) 1000 UNIT/ML IJ SOLN
INTRAMUSCULAR | Status: AC
  Filled 2020-07-27: qty 3

## 2020-07-27 MED ORDER — INSULIN ASPART 100 UNIT/ML IV SOLN
5.0000 [IU] | Freq: Once | INTRAVENOUS | Status: AC
  Administered 2020-07-27: 5 [IU] via INTRAVENOUS

## 2020-07-27 MED ORDER — SODIUM BICARBONATE 8.4 % IV SOLN
50.0000 meq | Freq: Once | INTRAVENOUS | Status: AC
  Administered 2020-07-27: 50 meq via INTRAVENOUS

## 2020-07-27 NOTE — Progress Notes (Signed)
Patient had chest pain earlier in evening.  Did not feel comfortable giving 45m of Albuterol.  Have a PRN treatment of 2.569mto patient.  BS were not that bad.

## 2020-07-27 NOTE — Progress Notes (Signed)
Assumed care of pt because pt is now on BIPAP.

## 2020-07-27 NOTE — Progress Notes (Signed)
Placed patient on Bipap at MD's request.  Patient seems to be getting good volumes and is currently not fighting the machine.  He fought the mask at first but calmed back down.  Patient's sats are now 96% on 40% FIO2.  Breathing is not as labored.  Waiting for ICU Bed to become available and will transport patient at that time.  Will continue to monitor.

## 2020-07-27 NOTE — Progress Notes (Signed)
CRITICAL VALUE ALERT  Critical Value:  pH 7.117 and PO2 < 31.0  Date & Time Notied:  07/28/2011 at 0313  Provider Notified: Dr. Orlin Hilding

## 2020-07-27 NOTE — Procedures (Signed)
Interventional Radiology Procedure Note  Procedure: Non-tunneled hemodialysis catheter placement  Findings: Please refer to procedural dictation for full description. R IJ, 16 cm.  Complications: None immediate  Estimated Blood Loss: <5 mL  Recommendations: Catheter ready for immediate use. Low IJ puncture, amenable to tunneled HD conversion if needed.   Ruthann Cancer, MD Pager: [phone]

## 2020-07-27 NOTE — Progress Notes (Addendum)
Patient complained of chest pain. MD notified. EKG and PRN Morphine ordered. BP elevated from normal range. Patient reevaluated after midnight. Vital signs stable, no chest pain. Lab called with critical potassium and lactic acid values. MD notified, new orders in place( see orders).  MD was at bedside to assess Patient due to JVD with labored breathing. Continuous BIPAP ordered.

## 2020-07-27 NOTE — Progress Notes (Signed)
[facility] here to transport patient to 4E 18

## 2020-07-27 NOTE — Progress Notes (Signed)
Patient belongings in bag: bedroom slippers, albuterol inhaler, Breo inhaler, Incruse inhaler, clothing, cell phone. Ready for transport to [facility].

## 2020-07-27 NOTE — Progress Notes (Addendum)
[facility] ROUNDING NOTE   Subjective:   Brief history 64 year old history of CVA diabetes mellitus NSTEMI 11/2019 history of hypertension COPD on oxygen reflux disease history of tubular adenoma presented to [facility] with abdominal pain was found to have an increasing creatinine 3.6 mg/dL.  Baseline about 1.5 to 2 mg/dL blood pressures were low at 76/66 but improved.  Potassium treated with lokelma.  Urinalysis negative for blood 1+ protein consistent with prior UAs.  Sodium of 111 Fina 0.2 renal ultrasound no evidence of hydronephrosis.  MRI has no evidence of renal artery stenosis.  Blood pressure 93/60 pulse 61 temperature 97.6 O2 sats 97% 40% FiO2  Labs pending from a.m.  pH 7.1 BUN 104 creatinine 7.27 last potassium was 6.8.  No recorded urine output   Objective:  Vital signs in last 24 hours:  Temp:  [97.5 F (36.4 C)-98.5 F (36.9 C)] 97.6 F (36.4 C) (11/15 0810) Pulse Rate:  [54-72] 61 (11/15 0810) Resp:  [18-28] 20 (11/15 0810) BP: (93-152)/(57-102) 93/60 (11/15 0810) SpO2:  [91 %-100 %] 97 % (11/15 0810) FiO2 (%):  [40 %] 40 % (11/15 0451) Weight:  [60.4 kg-62.5 kg] 60.4 kg (11/15 0600)  Weight change:  Filed Weights   07/25/2020 2137 07/27/20 0451 07/27/20 0600  Weight: 54 kg 62.5 kg 60.4 kg    Intake/Output: I/O last 3 completed shifts: In: 2243.3 [I.V.:2068.3; Other:50; IV Piggyback:125] Out: -    Intake/Output this shift:  No intake/output data recorded.  CVS- RRR no murmurs rubs or gallops RS- CTA soft nontender ABD- BS present soft non-distended EXT- no edema Foley catheter in place   Basic Metabolic Panel: Recent Labs  Lab 07/15/2020 2344 07/23/2020 2344 07/25/20 0412 07/25/20 0412 07/26/20 0419 07/26/20 1209 07/26/20 2341  NA 132*  --  132*  --  134* 134* 132*  K 4.9  --  6.2*  --  6.3* 6.4* 6.8*  CL 96*  --  97*  --  96* 96* 97*  CO2 25  --  21*  --  21* 17* 18*  GLUCOSE 119*  --  84  --  147* 138* 143*  BUN 67*   --  69*  --  93* 96* 104*  CREATININE 3.60*  --  3.56*  --  5.61* 6.03* 7.27*  CALCIUM 8.2*   < > 8.0*   < > 7.3* 7.7* 6.8*  MG  --   --  1.4*  --  1.9  --   --   PHOS  --   --   --   --  9.1*  --   --    < > = values in this interval not displayed.    Liver Function Tests: Recent Labs  Lab 07/27/2020 2344 07/25/20 0412  AST 33 41  ALT 29 34  ALKPHOS 90 86  BILITOT 0.5 0.6  PROT 7.6 7.4  ALBUMIN 4.0 3.9   Recent Labs  Lab 07/16/2020 2344  LIPASE 61*   No results for input(s): AMMONIA in the last 168 hours.  CBC: Recent Labs  Lab 07/31/2020 2344 07/25/20 0412  WBC 6.5 6.8  NEUTROABS 4.1 5.4  HGB 12.2* 11.5*  HCT 41.8 41.0  MCV 77.7* 79.3*  PLT 348 358    Cardiac Enzymes: No results for input(s): CKTOTAL, CKMB, CKMBINDEX, TROPONINI in the last 168 hours.  BNP: Invalid input(s): POCBNP  CBG: Recent Labs  Lab 07/26/20 1235 07/27/20 0102  GLUCAP 119* 121*    Microbiology: Results for orders  placed or performed during the hospital encounter of 07/20/2020  Respiratory Panel by RT PCR (Flu A&B, Covid) - Nasopharyngeal Swab     Status: None   Collection Time: 08/01/2020 10:22 PM   Specimen: Nasopharyngeal Swab  Result Value Ref Range Status   SARS Coronavirus 2 by RT PCR NEGATIVE NEGATIVE Final    Comment: (NOTE) SARS-CoV-2 target nucleic acids are NOT DETECTED.  The SARS-CoV-2 RNA is generally detectable in upper respiratoy specimens during the acute phase of infection. The lowest concentration of SARS-CoV-2 viral copies this assay can detect is 131 copies/mL. A negative result does not preclude SARS-Cov-2 infection and should not be used as the sole basis for treatment or other patient management decisions. A negative result may occur with  improper specimen collection/handling, submission of specimen other than nasopharyngeal swab, presence of viral mutation(s) within the areas targeted by this assay, and inadequate number of viral copies (<131 copies/mL). A  negative result must be combined with clinical observations, patient history, and epidemiological information. The expected result is Negative.  Fact Sheet for Patients:  PinkCheek.be  Fact Sheet for Healthcare Providers:  GravelBags.it  This test is no t yet approved or cleared by the [place] FDA and  has been authorized for detection and/or diagnosis of SARS-CoV-2 by FDA under an Emergency Use Authorization (EUA). This EUA will remain  in effect (meaning this test can be used) for the duration of the COVID-19 declaration under Section 564(b)(1) of the Act, 21 U.S.C. section 360bbb-3(b)(1), unless the authorization is terminated or revoked sooner.     Influenza A by PCR NEGATIVE NEGATIVE Final   Influenza B by PCR NEGATIVE NEGATIVE Final    Comment: (NOTE) The Xpert Xpress SARS-CoV-2/FLU/RSV assay is intended as an aid in  the diagnosis of influenza from Nasopharyngeal swab specimens and  should not be used as a sole basis for treatment. Nasal washings and  aspirates are unacceptable for Xpert Xpress SARS-CoV-2/FLU/RSV  testing.  Fact Sheet for Patients: PinkCheek.be  Fact Sheet for Healthcare Providers: GravelBags.it  This test is not yet approved or cleared by the [place] FDA and  has been authorized for detection and/or diagnosis of SARS-CoV-2 by  FDA under an Emergency Use Authorization (EUA). This EUA will remain  in effect (meaning this test can be used) for the duration of the  Covid-19 declaration under Section 564(b)(1) of the Act, 21  U.S.C. section 360bbb-3(b)(1), unless the authorization is  terminated or revoked. Performed at [facility], [address], [place], [place] [address]     Coagulation Studies: No results for input(s): LABPROT, INR in the last 72 hours.  Urinalysis: Recent Labs    07/25/20 0356  COLORURINE YELLOW   LABSPEC 1.012  PHURINE 5.0  GLUCOSEU NEGATIVE  HGBUR NEGATIVE  BILIRUBINUR NEGATIVE  KETONESUR NEGATIVE  PROTEINUR 30*  NITRITE NEGATIVE  LEUKOCYTESUR NEGATIVE      Imaging: US RENAL  Result Date: 07/25/2020 CLINICAL DATA:  Acute kidney injury, abdominal pain EXAM: RENAL / URINARY TRACT ULTRASOUND COMPLETE COMPARISON:  Recent abdominal MRI 07/17/2020 FINDINGS: Right Kidney: Renal measurements: 9.2 x 4.4 x 5.2 cm = volume: 110 mL. The parenchyma is mildly echogenic. There is increased conspicuity between the corticomedullary interface. No mass or hydronephrosis visualized. Left Kidney: Renal measurements: 8.5 x 4.9 x 4.8 cm = volume: 105 mL. The parenchyma is mildly echogenic. There is increased conspicuity between the corticomedullary interface. No mass or hydronephrosis visualized. Bladder: Appears normal for degree of bladder distention. Other: None. IMPRESSION:  1. No evidence of hydronephrosis. 2. Mildly echogenic renal parenchyma suggests underlying medical renal disease. Electronically Signed   By: Jacqulynn Cadet M.D.   On: 07/25/2020 10:45     Medications:   . sodium chloride    . sodium chloride     . Chlorhexidine Gluconate Cloth  6 each Topical Q0600  . umeclidinium bromide  1 puff Inhalation Daily   And  . fluticasone furoate-vilanterol  1 puff Inhalation Daily  . gabapentin  300 mg Oral QHS  . guaiFENesin  600 mg Oral BID  . influenza vac split quadrivalent PF  0.5 mL Intramuscular Tomorrow-1000  . metoprolol tartrate  12.5 mg Oral BID  . rosuvastatin  10 mg Oral Daily  . sodium bicarbonate  650 mg Oral BID  . sodium zirconium cyclosilicate  10 g Oral Daily  . thiamine injection  100 mg Intravenous Daily   sodium chloride, sodium chloride, acetaminophen **OR** acetaminophen, albuterol, alteplase, heparin, lidocaine (PF), lidocaine-prilocaine, methocarbamol, morphine injection, ondansetron **OR** ondansetron (ZOFRAN) IV, oxyCODONE,  pentafluoroprop-tetrafluoroeth  Assessment/ Plan:  1. AKI on CKD:  Baseline Cr 1.5-2 but on presentation 3.6 and trended up to 5.6 over past 36h.    Continues to be hypotensive.  No evidence of AIN or glomerulonephritis on urinalysis.  Does have some proteinuria on dipstick imaging does not show any evidence of obstruction.  Blood pressure may be too low for conventional intermittent hemodialysis..  Patient is to have IJ catheter placed and dialysis initiated.  Dialysis orders have been placed.  Discussed with family  -  Sister   She wants to discuss this with son  Who is next of kin .  Will manage medically until firm decision  2. Hyperkalemia:    Awaiting approval from family for dialysis will treat medically for now   3. Resp Acidosis + AGMA:  Presumably secondary to COPD + AKI but will r/u ingestions for completeness.  Supplemental PO bicarb.    Start D5W 3 amps bicarb   4. DM complicated by neuropathy: Says diet controlled at home;  insulin per primary, dec gabapentin from 300 BID to qhs given renal function.    LOS: Guy _0 _1 :36 AM

## 2020-07-27 NOTE — Progress Notes (Signed)
Report given to [facility]

## 2020-07-27 NOTE — Progress Notes (Signed)
Pt transported on Bipap from IR to [id number]. RT and SWOT RN accompanied pt. VSS throughout. RT will continue to monitor.

## 2020-07-27 NOTE — Progress Notes (Signed)
PROGRESS NOTE    Bryan [name]  JJK:[id number] DOB: 01-24-1956 DOA: 08/01/2020 PCP: Wynonia Hazard, NP   Brief Narrative:  64 year old male with a history of chronic abdominal pain followed by gastroenterology, diabetes mellitus, NSTEMI March 2021, COPD with chronic hypoxic respiratory failure presented with complaints of shortness of breath and abdominal pain.  Patient found to have acute kidney injury.  Nephrology and palliative care also consulted.  Assessment & Plan   Acute kidney injury on chronic kidney disease, stage IIIa with anion gap metabolic acidosis -Unclear etiology -Patient with hypotensive on admission -Torsemide as well as Dexilant held -Creatinine peaked to 7.58 today (baseline creatinine approximately 1.5-2 although he presented with an creatinine of 3.6) -Nephrology consulted and appreciated -UA unremarkable -Discussed with Dr. Justin Mend, IJ catheter placed for initiation of dialysis -Continue bicarb  Hyperkalemia -Secondary to acute kidney injury -Mildly improved, currently 5.8 -Patient had received Lokelma that was placed on Kayexalate as he was unable to participate in oral intake of Lokelma -Suspect will continue to improve with hemodialysis -Continue to monitor BMP  Hypomagnesemia -Replace, will monitor and replace as needed  Acute hypoxic respiratory failure -Overnight, patient required 4 L of nasal cannula -VBG was obtained shows pH of 7.117, PCO2 65.8, PO2 less than 31.0, bicarb 16 -Patient was placed on BiPAP  Chronic abdominal pain -Patient with a history of GERD, PUD, severe grade D reflux esophagitis, dysphagia, PVD without motility issues, constipation. Patient follows with gastroenterology and was seen on 07/25/2020 at which time outpatient EGD was planned. He was tried on a trial of Dexilant, Levsin and Linzess.  Bentyl was discontinued.  He was referred to vascular surgery for abnormalities found on MRI.  Abnormalities were not thought to be  contributory to pain or represent chronic mesenteric ischemia. -Minimally elevated lipase -Currently stable  COPD/emphysema with chronic hypoxic respiratory failure/pulmonary hypertension -Chest x-ray showed no acute infection -Patient was placed on BiPAP -At baseline, patient is on 2 L of home oxygen  Possible sleep apnea -Pulmonology arranging outpatient sleep study  Diabetes mellitus, type II with neuropathy -Hemoglobin A1c 6.7 on June 2021 -Continue gabapentin however dose decreased due to renal failure -Currently not on insulin sliding scale  Goals of care -Patient currently DNR -Palliative care consulted and appreciated  DVT Prophylaxis Heparin  Code Status: DNR  Family Communication: None at bedside  Disposition Plan:  Status is: Inpatient  Remains inpatient appropriate because:Hemodynamically unstable, Persistent severe electrolyte disturbances, IV treatments appropriate due to intensity of illness or inability to take PO and Inpatient level of care appropriate due to severity of illness   Dispo: The patient is from: Home              Anticipated d/c is to: TBD              Anticipated d/c date is: > 3 days              Patient currently is not medically stable to d/c.   Consultants Nephrology Palliative care  Procedures  Renal US  Antibiotics   Anti-infectives (From admission, onward)   None      Subjective:   Bryan [name] seen and examined today.  Currently not interactive. Bipap in place.  Objective:   Vitals:   07/27/20 1100 07/27/20 1158 07/27/20 1200 07/27/20 1300  BP:   110/66 90/77  Pulse: 65 62 65 61  Resp: (!) 22 (!) 24 (!) 30 (!) 22  Temp:   97.6 F (36.4 C)  TempSrc:   Axillary   SpO2: 98% 98% 92% 98%  Weight:      Height:        Intake/Output Summary (Last 24 hours) at 07/27/2020 1342 Last data filed at 07/27/2020 0400 Gross per 24 hour  Intake 2143.33 ml  Output --  Net 2143.33 ml   Filed Weights   08/08/2020 2137  07/27/20 0451 07/27/20 0600  Weight: 54 kg 62.5 kg 60.4 kg    Exam  General: Well developed, chronically ill-appearing, NAD  HEENT: NCAT, BiPAP in place  Cardiovascular: S1 S2 auscultated, RRR  Respiratory: Diminished breath sounds  Abdomen: Soft, nontender, nondistended, + bowel sounds  Extremities: warm dry without cyanosis clubbing or edema  Neuro: cannot assess, moving extremities  Psych: Cannot assess   Data Reviewed: I have personally reviewed following labs and imaging studies  CBC: Recent Labs  Lab 07/17/2020 2344 07/25/20 0412 07/27/20 0745  WBC 6.5 6.8 10.6*  NEUTROABS 4.1 5.4  --   HGB 12.2* 11.5* 9.9*  HCT 41.8 41.0 33.5*  MCV 77.7* 79.3* 77.5*  PLT 348 358 438   Basic Metabolic Panel: Recent Labs  Lab 07/25/20 0412 07/25/20 0412 07/26/20 0419 07/26/20 1209 07/26/20 2341 07/27/20 0745 07/27/20 1237  NA 132*   < > 134* 134* 132* 131* 133*  K 6.2*   < > 6.3* 6.4* 6.8* 6.2* 5.8*  CL 97*   < > 96* 96* 97* 93* 96*  CO2 21*   < > 21* 17* 18* 22 23  GLUCOSE 84   < > 147* 138* 143* 245* 176*  BUN 69*   < > 93* 96* 104* 108* 108*  CREATININE 3.56*   < > 5.61* 6.03* 7.27* 7.29* 7.58*  CALCIUM 8.0*   < > 7.3* 7.7* 6.8* 6.5* 6.7*  MG 1.4*  --  1.9  --   --   --   --   PHOS  --   --  9.1*  --   --  9.0* 8.6*   < > = values in this interval not displayed.   GFR: Estimated Creatinine Clearance: 7.9 mL/min (A) (by C-G formula based on SCr of 7.58 mg/dL (H)). Liver Function Tests: Recent Labs  Lab 07/25/2020 2344 07/25/20 0412 07/27/20 0745 07/27/20 1237  AST 33 41  --   --   ALT 29 34  --   --   ALKPHOS 90 86  --   --   BILITOT 0.5 0.6  --   --   PROT 7.6 7.4  --   --   ALBUMIN 4.0 3.9 3.5 3.3*   Recent Labs  Lab 08/07/2020 2344  LIPASE 61*   No results for input(s): AMMONIA in the last 168 hours. Coagulation Profile: No results for input(s): INR, PROTIME in the last 168 hours. Cardiac Enzymes: No results for input(s): CKTOTAL, CKMB,  CKMBINDEX, TROPONINI in the last 168 hours. BNP (last 3 results) No results for input(s): PROBNP in the last 8760 hours. HbA1C: No results for input(s): HGBA1C in the last 72 hours. CBG: Recent Labs  Lab 07/26/20 1235 07/27/20 0102 07/27/20 1230  GLUCAP 119* 121* 173*   Lipid Profile: No results for input(s): CHOL, HDL, LDLCALC, TRIG, CHOLHDL, LDLDIRECT in the last 72 hours. Thyroid Function Tests: No results for input(s): TSH, T4TOTAL, FREET4, T3FREE, THYROIDAB in the last 72 hours. Anemia Panel: No results for input(s): VITAMINB12, FOLATE, FERRITIN, TIBC, IRON, RETICCTPCT in the last 72 hours. Urine analysis:    Component Value Date/Time  COLORURINE YELLOW 07/25/2020 0356   APPEARANCEUR HAZY (A) 07/25/2020 0356   LABSPEC 1.012 07/25/2020 0356   PHURINE 5.0 07/25/2020 0356   GLUCOSEU NEGATIVE 07/25/2020 0356   HGBUR NEGATIVE 07/25/2020 0356   BILIRUBINUR NEGATIVE 07/25/2020 0356   KETONESUR NEGATIVE 07/25/2020 0356   PROTEINUR 30 (A) 07/25/2020 0356   UROBILINOGEN 0.2 07/08/2015 1314   NITRITE NEGATIVE 07/25/2020 0356   LEUKOCYTESUR NEGATIVE 07/25/2020 0356   Sepsis Labs: _0 (procalcitonin:4,lacticidven:4)  ) Recent Results (from the past 240 hour(s))  Respiratory Panel by RT PCR (Flu A&B, Covid) - Nasopharyngeal Swab     Status: None   Collection Time: 08/11/2020 10:22 PM   Specimen: Nasopharyngeal Swab  Result Value Ref Range Status   SARS Coronavirus 2 by RT PCR NEGATIVE NEGATIVE Final    Comment: (NOTE) SARS-CoV-2 target nucleic acids are NOT DETECTED.  The SARS-CoV-2 RNA is generally detectable in upper respiratoy specimens during the acute phase of infection. The lowest concentration of SARS-CoV-2 viral copies this assay can detect is 131 copies/mL. A negative result does not preclude SARS-Cov-2 infection and should not be used as the sole basis for treatment or other patient management decisions. A negative result may occur with  improper specimen  collection/handling, submission of specimen other than nasopharyngeal swab, presence of viral mutation(s) within the areas targeted by this assay, and inadequate number of viral copies (<131 copies/mL). A negative result must be combined with clinical observations, patient history, and epidemiological information. The expected result is Negative.  Fact Sheet for Patients:  PinkCheek.be  Fact Sheet for Healthcare Providers:  GravelBags.it  This test is no t yet approved or cleared by the [place] FDA and  has been authorized for detection and/or diagnosis of SARS-CoV-2 by FDA under an Emergency Use Authorization (EUA). This EUA will remain  in effect (meaning this test can be used) for the duration of the COVID-19 declaration under Section 564(b)(1) of the Act, 21 U.S.C. section 360bbb-3(b)(1), unless the authorization is terminated or revoked sooner.     Influenza A by PCR NEGATIVE NEGATIVE Final   Influenza B by PCR NEGATIVE NEGATIVE Final    Comment: (NOTE) The Xpert Xpress SARS-CoV-2/FLU/RSV assay is intended as an aid in  the diagnosis of influenza from Nasopharyngeal swab specimens and  should not be used as a sole basis for treatment. Nasal washings and  aspirates are unacceptable for Xpert Xpress SARS-CoV-2/FLU/RSV  testing.  Fact Sheet for Patients: PinkCheek.be  Fact Sheet for Healthcare Providers: GravelBags.it  This test is not yet approved or cleared by the [place] FDA and  has been authorized for detection and/or diagnosis of SARS-CoV-2 by  FDA under an Emergency Use Authorization (EUA). This EUA will remain  in effect (meaning this test can be used) for the duration of the  Covid-19 declaration under Section 564(b)(1) of the Act, 21  U.S.C. section 360bbb-3(b)(1), unless the authorization is  terminated or revoked. Performed at [facility], [address], [place], [place] [address]       Radiology Studies: No results found.   Scheduled Meds: . Chlorhexidine Gluconate Cloth  6 each Topical Q0600  . umeclidinium bromide  1 puff Inhalation Daily   And  . fluticasone furoate-vilanterol  1 puff Inhalation Daily  . gabapentin  300 mg Oral QHS  . guaiFENesin  600 mg Oral BID  . heparin sodium (porcine)      . influenza vac split quadrivalent PF  0.5 mL Intramuscular Tomorrow-1000  . lidocaine-EPINEPHrine      .  metoprolol tartrate  12.5 mg Oral BID  . rosuvastatin  10 mg Oral Daily  . sodium bicarbonate  650 mg Oral BID  . sodium zirconium cyclosilicate  10 g Oral Daily  . thiamine injection  100 mg Intravenous Daily   Continuous Infusions: . sodium chloride    . sodium chloride    .  sodium bicarbonate (isotonic) infusion in sterile water 75 mL/hr at 07/27/20 1043     LOS: 2 days   Time Spent in minutes   45 minutes  Tzivia Oneil D.O. on 07/27/2020 at 1:42 PM  Between 7am to 7pm - Please see pager noted on [email or web address]  After 7pm go to [email or web address]  And look for the night coverage person covering for me after hours  [facility] Office  [phone]

## 2020-07-27 NOTE — Progress Notes (Signed)
Spoke with patient's son who is at bedside with Dr. Justin Mend. He is ok with starting HD for now, but would like more time to consider long term decisions. Requesting palliative care consult due to complex chronic care. Son was not aware of severity of father's illness and comorbidities.  Gladys Damme, MD [facility], PGY-2

## 2020-07-27 NOTE — Progress Notes (Signed)
Called to bedside to evaluate patient. Patient currently on 4 L [place]. This is up from admission - 2.5L [place]. Patient now with JVD. Patient now altered. Slow to respond, and when he does respond - slow speech. He is awake though. Cr has continued to trend up, Potassium has continued to trend up. Lactic acid minimally improved. I personally called Nephro to discuss possible Dialysis tonight. They have advised to start BiPAP and that they would come in around 5:00am. At this time, patient is alert enough for BiPAP. I have notified RT of the order. Transfer to stepdown also ordered. Nephro then called and reported that it would be better to transfer patient to [facility] where he could have access to dialysis in a quicker timeframe. Bed placement reports that there are currently no beds available in the progressive or tele unit. They are going to work on finding him a bed ASAP.

## 2020-07-27 NOTE — Progress Notes (Signed)
Marzetta Board (Sister) and Emergency Contact called at this time to notify of patient transfer.  HIPAA compliant voicemail left.

## 2020-07-27 NOTE — Progress Notes (Signed)
RT called to bedside per pt Sats in the 80s. SATS 88% on arrival.  Made BIPAP changes and pt SATS 95 Will continue to wean FIO2 on BIPAP as patient allows. RT to cont to monitor.

## 2020-07-27 NOTE — Progress Notes (Addendum)
Patient Status: Bryan [name] - In-pt  Assessment and Plan: Patient in need of venous access.  Non tunneled dialysis catheter placement  ______________________________________________________________________   History of Present Illness: Bryan [name] is a 64 y.o. male   Hx CVA; DM; CAD; HTN; COPD To [facility] 11/14 with abd pain -- off and on for months Worsening x few weeks-- evaluation ensued Found to have high Cr level 3.6 and was admitted Nephrology consult Dr Johnney Ou  Assessment/Plan 07/26/20 **AKI on CKD:  Baseline Cr 1.5-2 but on presentation 3.6 and trended up to 5.6 over past 36h.  Was hypotensive on presentation with BP low enough to cause ischemic ATN + likely component of hypovolemia/prerenal with FeNa.  No evidence for AIN or glomerulonephritis on UA - does have proteinuria on dipstick (though has had in past).  Imaging without obstruction.  Will quantify with UP/C can send serologic w/u and consider biopsy.  For now will continue IV fluids and trend renal function.  If pt develops indication for dialysis he would consent.  Dr Justin Mend has seen pt today 11/15 Cr 7.3 this am Initiating dialysis asap Non tunneled cath today  **Hyperkalemia:  K 6.3, tx with lokelma 5g at 7am, rechecking BMP now.   **Resp Acidosis + AGMA:  Presumably secondary to COPD + AKI but will r/u ingestions for completeness.  Supplemental PO bicarb.    **DM complicated by neuropathy: Says diet controlled at home;  insulin per primary, dec gabapentin from 300 BID to qhs given renal function.   Allergies and medications reviewed.   Review of Systems: A 12 point ROS discussed and pertinent positives are indicated in the HPI above.  All other systems are negative.   Vital Signs: BP 93/60 (BP Location: Right Arm)    Pulse 62    Temp 97.6 F (36.4 C) (Oral)    Resp 20    Ht _0  (1.6 m)    Wt 133 lb 2.5 oz (60.4 kg)    SpO2 97%    BMI 23.59 kg/m     Imaging reviewed.   Labs:  COAGS: No results for  input(s): INR, APTT in the last 8760 hours.  BMP: Recent Labs    04/21/20 0428 04/21/20 0428 04/22/20 0505 04/22/20 0505 04/28/20 2009 04/28/20 2009 05/19/20 0200 07/26/2020 2344 07/26/20 0419 07/26/20 1209 07/26/20 2341 07/27/20 0745  NA 137   < > 136   < > 143   < > 138   < > 134* 134* 132* 131*  K 4.2   < > 3.7   < > 4.7   < > 3.9   < > 6.3* 6.4* 6.8* 6.2*  CL 93*   < > 90*   < > 105   < > 96*   < > 96* 96* 97* 93*  CO2 33*   < > 33*   < > 30   < > 30   < > 21* 17* 18* 22  GLUCOSE 143*   < > 97   < > 109*   < > 107*   < > 147* 138* 143* 245*  BUN 27*   < > 33*   < > 21   < > 46*   < > 93* 96* 104* 108*  CALCIUM 8.5*   < > 8.4*   < > 8.4*   < > 8.9   < > 7.3* 7.7* 6.8* 6.5*  CREATININE 1.60*   < > 1.63*   < > 1.69*   < >  2.46*   < > 5.61* 6.03* 7.27* 7.29*  GFRNONAA 45*   < > 44*   < > 42*   < > 27*   < > 11* 10* 8* 8*  GFRAA 52*  --  51*  --  49*  --  31*  --   --   --   --   --    < > = values in this interval not displayed.   Scheduled for non tunneled dialysis catheter placement To initiate dialysis asap Acute renal failure  I have spoken to son at bedside He is aware of procedure benefits and risks Agreeable to proceed Consent signed and in chart  Electronically Signed: Lavonia Drafts, PA-C 07/27/2020, 11:27 AM   I spent a total of 15 minutes in face to face in clinical consultation, greater than 50% of which was counseling/coordinating care for venous access.

## 2020-07-28 ENCOUNTER — Encounter (HOSPITAL_COMMUNITY): Payer: Self-pay

## 2020-07-28 ENCOUNTER — Inpatient Hospital Stay (HOSPITAL_COMMUNITY): Payer: Medicare Other

## 2020-07-28 ENCOUNTER — Telehealth: Payer: Self-pay

## 2020-07-28 ENCOUNTER — Encounter (HOSPITAL_COMMUNITY): Payer: Medicare Other

## 2020-07-28 DIAGNOSIS — N179 Acute kidney failure, unspecified: Secondary | ICD-10-CM | POA: Diagnosis not present

## 2020-07-28 DIAGNOSIS — R109 Unspecified abdominal pain: Secondary | ICD-10-CM | POA: Diagnosis not present

## 2020-07-28 DIAGNOSIS — J9621 Acute and chronic respiratory failure with hypoxia: Secondary | ICD-10-CM

## 2020-07-28 DIAGNOSIS — E875 Hyperkalemia: Secondary | ICD-10-CM

## 2020-07-28 DIAGNOSIS — N1831 Chronic kidney disease, stage 3a: Secondary | ICD-10-CM | POA: Diagnosis not present

## 2020-07-28 DIAGNOSIS — Z7189 Other specified counseling: Secondary | ICD-10-CM

## 2020-07-28 DIAGNOSIS — J9622 Acute and chronic respiratory failure with hypercapnia: Secondary | ICD-10-CM | POA: Diagnosis not present

## 2020-07-28 DIAGNOSIS — Z515 Encounter for palliative care: Secondary | ICD-10-CM

## 2020-07-28 DIAGNOSIS — J9611 Chronic respiratory failure with hypoxia: Secondary | ICD-10-CM | POA: Diagnosis not present

## 2020-07-28 DIAGNOSIS — R578 Other shock: Secondary | ICD-10-CM

## 2020-07-28 LAB — GLUCOSE, CAPILLARY
Glucose-Capillary: 120 mg/dL — ABNORMAL HIGH (ref 70–99)
Glucose-Capillary: 127 mg/dL — ABNORMAL HIGH (ref 70–99)
Glucose-Capillary: 118 mg/dL — ABNORMAL HIGH (ref 70–99)

## 2020-07-28 LAB — ETHYLENE GLYCOL: Ethylene Glycol Lvl: <5 mg/dL

## 2020-07-28 LAB — RENAL FUNCTION PANEL
Albumin: 3.3 g/dL — ABNORMAL LOW (ref 3.5–5.0)
Anion gap: 14 (ref 5–15)
BUN: 72 mg/dL — ABNORMAL HIGH (ref 8–23)
CO2: 25 mmol/L (ref 22–32)
Calcium: 7 mg/dL — ABNORMAL LOW (ref 8.9–10.3)
Chloride: 95 mmol/L — ABNORMAL LOW (ref 98–111)
Creatinine, Ser: 6.09 mg/dL — ABNORMAL HIGH (ref 0.61–1.24)
GFR, Estimated: 10 mL/min — ABNORMAL LOW (ref >60)
Glucose, Bld: 109 mg/dL — ABNORMAL HIGH (ref 70–99)
Phosphorus: 6.5 mg/dL — ABNORMAL HIGH (ref 2.5–4.6)
Potassium: 5.8 mmol/L — ABNORMAL HIGH (ref 3.5–5.1)
Sodium: 134 mmol/L — ABNORMAL LOW (ref 135–145)

## 2020-07-28 LAB — BLOOD GAS, ARTERIAL
Acid-base deficit: 2.6 mmol/L — ABNORMAL HIGH (ref 0.0–2.0)
Bicarbonate: 23.1 mmol/L (ref 20.0–28.0)
Drawn by: 41422
FIO2: 60
O2 Saturation: 91.4 %
Patient temperature: 37.1
pCO2 arterial: 51.1 mmHg — ABNORMAL HIGH (ref 32.0–48.0)
pH, Arterial: 7.279 — ABNORMAL LOW (ref 7.350–7.450)
pO2, Arterial: 75.8 mmHg — ABNORMAL LOW (ref 83.0–108.0)

## 2020-07-28 LAB — HEMOGLOBIN A1C
Hgb A1c MFr Bld: 6.8 % — ABNORMAL HIGH (ref 4.8–5.6)
Mean Plasma Glucose: 148.46 mg/dL

## 2020-07-28 LAB — CBC
HCT: 34.6 % — ABNORMAL LOW (ref 39.0–52.0)
Hemoglobin: 10.2 g/dL — ABNORMAL LOW (ref 13.0–17.0)
MCH: 22.7 pg — ABNORMAL LOW (ref 26.0–34.0)
MCHC: 29.5 g/dL — ABNORMAL LOW (ref 30.0–36.0)
MCV: 77.1 fL — ABNORMAL LOW (ref 80.0–100.0)
Platelets: 141 10*3/uL — ABNORMAL LOW (ref 150–400)
RBC: 4.49 MIL/uL (ref 4.22–5.81)
RDW: 18.3 % — ABNORMAL HIGH (ref 11.5–15.5)
WBC: 13.1 10*3/uL — ABNORMAL HIGH (ref 4.0–10.5)
nRBC: 2.2 % — ABNORMAL HIGH (ref 0.0–0.2)

## 2020-07-28 LAB — PROTIME-INR
INR: 2.4 — ABNORMAL HIGH (ref 0.8–1.2)
Prothrombin Time: 25.1 seconds — ABNORMAL HIGH (ref 11.4–15.2)

## 2020-07-28 LAB — APTT: aPTT: 99 seconds — ABNORMAL HIGH (ref 24–36)

## 2020-07-28 MED ORDER — EPINEPHRINE 1 MG/10ML IJ SOSY
0.1000 mg | PREFILLED_SYRINGE | Freq: Once | INTRAMUSCULAR | Status: AC
  Administered 2020-07-28: 0.1 mg via INTRAVENOUS

## 2020-07-28 MED ORDER — MAGNESIUM SULFATE 50 % IJ SOLN
1.0000 g | Freq: Once | INTRAMUSCULAR | Status: DC
  Filled 2020-07-28: qty 2

## 2020-07-28 MED ORDER — SODIUM CHLORIDE 0.9 % IV SOLN: INTRAVENOUS | Status: DC

## 2020-07-28 MED ORDER — ATROPINE SULFATE 1 MG/10ML IJ SOSY: 1.0000 mg | PREFILLED_SYRINGE | Freq: Once | INTRAMUSCULAR | Status: DC

## 2020-07-28 MED ORDER — EPINEPHRINE 1 MG/10ML IJ SOSY: 0.5000 mg | PREFILLED_SYRINGE | Freq: Once | INTRAMUSCULAR | Status: DC

## 2020-07-28 MED ORDER — SODIUM CHLORIDE 0.9 % FOR CRRT: INTRAVENOUS_CENTRAL | Status: DC | PRN

## 2020-07-28 MED ORDER — FUROSEMIDE 10 MG/ML IJ SOLN
INTRAMUSCULAR | Status: AC
  Administered 2020-07-28: 120 mg
  Filled 2020-07-28: qty 12

## 2020-07-28 MED ORDER — HEPARIN SODIUM (PORCINE) 1000 UNIT/ML DIALYSIS
1000.0000 [IU] | INTRAMUSCULAR | Status: DC | PRN
  Administered 2020-07-28: 2600 [IU] via INTRAVENOUS_CENTRAL
  Filled 2020-07-28: qty 6
  Filled 2020-07-28: qty 4
  Filled 2020-07-28: qty 6

## 2020-07-28 MED ORDER — SODIUM CHLORIDE 0.9 % IV SOLN: 250.0000 mL | INTRAVENOUS | Status: DC

## 2020-07-28 MED ORDER — FENTANYL CITRATE (PF) 100 MCG/2ML IJ SOLN
25.0000 ug | INTRAMUSCULAR | Status: DC | PRN
  Administered 2020-07-28: 50 ug via INTRAVENOUS
  Filled 2020-07-28: qty 2

## 2020-07-28 MED ORDER — SODIUM BICARBONATE 8.4 % IV SOLN
50.0000 meq | Freq: Once | INTRAVENOUS | Status: AC
  Administered 2020-07-28: 50 meq via INTRAVENOUS

## 2020-07-28 MED ORDER — PANTOPRAZOLE SODIUM 40 MG PO PACK: 40.0000 mg | PACK | Freq: Every day | ORAL | Status: DC

## 2020-07-28 MED ORDER — INSULIN ASPART 100 UNIT/ML ~~LOC~~ SOLN: 0.0000 [IU] | SUBCUTANEOUS | Status: DC

## 2020-07-28 MED ORDER — PRISMASOL BGK 0/2.5 32-2.5 MEQ/L EC SOLN
Status: DC
  Filled 2020-07-28 (×2): qty 5000

## 2020-07-28 MED ORDER — PHENYLEPHRINE 40 MCG/ML (10ML) SYRINGE FOR IV PUSH (FOR BLOOD PRESSURE SUPPORT): 80.0000 ug | PREFILLED_SYRINGE | Freq: Once | INTRAVENOUS | Status: DC | PRN

## 2020-07-28 MED ORDER — PHENYLEPHRINE 40 MCG/ML (10ML) SYRINGE FOR IV PUSH (FOR BLOOD PRESSURE SUPPORT)
PREFILLED_SYRINGE | INTRAVENOUS | Status: AC
  Administered 2020-07-28: 40 ug
  Filled 2020-07-28: qty 20

## 2020-07-28 MED ORDER — ATROPINE SULFATE 1 MG/10ML IJ SOSY
PREFILLED_SYRINGE | INTRAMUSCULAR | Status: AC
  Administered 2020-07-28: 1 mg
  Filled 2020-07-28: qty 10

## 2020-07-28 MED ORDER — PRISMASOL BGK 0/2.5 32-2.5 MEQ/L EC SOLN
Status: DC
  Filled 2020-07-28 (×6): qty 5000

## 2020-07-28 MED ORDER — FUROSEMIDE 10 MG/ML IJ SOLN: 120.0000 mg | Freq: Once | INTRAVENOUS | Status: DC

## 2020-07-28 MED ORDER — PRISMASOL BGK 4/2.5 32-4-2.5 MEQ/L REPLACEMENT SOLN
Status: DC
  Filled 2020-07-28 (×2): qty 5000

## 2020-07-28 MED ORDER — FENTANYL CITRATE (PF) 100 MCG/2ML IJ SOLN
INTRAMUSCULAR | Status: AC
  Filled 2020-07-28: qty 4

## 2020-07-28 MED ORDER — PHENYLEPHRINE HCL-NACL 10-0.9 MG/250ML-% IV SOLN
25.0000 ug/min | INTRAVENOUS | Status: DC
  Administered 2020-07-28: 200 ug/min via INTRAVENOUS
  Filled 2020-07-28: qty 250

## 2020-07-28 MED ORDER — SODIUM CHLORIDE 0.9 % IV SOLN: Freq: Once | INTRAVENOUS | Status: DC

## 2020-07-28 MED ORDER — NOREPINEPHRINE 4 MG/250ML-% IV SOLN
0.0000 ug/min | INTRAVENOUS | Status: DC
  Administered 2020-07-28: 2 ug/min via INTRAVENOUS
  Administered 2020-07-28: 40 ug/min via INTRAVENOUS
  Filled 2020-07-28 (×2): qty 250

## 2020-07-28 MED ORDER — TENECTEPLASE 50 MG IV KIT
30.0000 mg | PACK | Freq: Once | INTRAVENOUS | Status: AC
  Administered 2020-07-28: 30 mg via INTRAVENOUS
  Filled 2020-07-28: qty 10

## 2020-07-28 MED ORDER — MIDAZOLAM HCL 2 MG/2ML IJ SOLN
INTRAMUSCULAR | Status: AC
  Filled 2020-07-28: qty 2

## 2020-07-28 MED ORDER — LORAZEPAM 2 MG/ML IJ SOLN
INTRAMUSCULAR | Status: AC
  Filled 2020-07-28: qty 1

## 2020-07-28 MED ORDER — CALCIUM GLUCONATE-NACL 1-0.675 GM/50ML-% IV SOLN
1.0000 g | Freq: Once | INTRAVENOUS | Status: DC
  Filled 2020-07-28: qty 50

## 2020-07-28 MED ORDER — PHENYLEPHRINE HCL-NACL 10-0.9 MG/250ML-% IV SOLN
INTRAVENOUS | Status: AC
  Administered 2020-07-28: 10 ug/min
  Filled 2020-07-28: qty 250

## 2020-07-28 MED ORDER — CALCIUM CHLORIDE 10 % IV SOLN
1.0000 g | Freq: Once | INTRAVENOUS | Status: AC
  Administered 2020-07-28: 1 g via INTRAVENOUS

## 2020-07-29 NOTE — Progress Notes (Signed)
Discharge Progress Report  Patient Details  Name: Bryan [name] MRN: [id number] Date of Birth: 1956-06-26 Referring Provider:     PULMONARY REHAB OTHER RESP ORIENTATION from 06/25/2020 in [facility]  Referring Provider Dr. Halford Chessman       Number of Visits: 3  Reason for Discharge:  Early Exit:  Patient deceased.   Smoking History:  Social History   Tobacco Use  Smoking Status Former Smoker  . Packs/day: 0.50  . Years: 40.00  . Pack years: 20.00  . Types: Cigarettes  . Quit date: 11/11/2014  . Years since quitting: 5.7  Smokeless Tobacco Never Used    Diagnosis:  Centrilobular emphysema (HCC)  ADL UCSD:  Pulmonary Assessment Scores    Row Name 06/25/20 1012         ADL UCSD   ADL Phase Entry     SOB Score total 66       CAT Score   CAT Score 20       mMRC Score   mMRC Score 4            Initial Exercise Prescription:  Initial Exercise Prescription - 06/25/20 1000      Date of Initial Exercise RX and Referring Provider   Date 06/25/20    Referring Provider Dr. Halford Chessman    Expected Discharge Date 10/29/20      Oxygen   Oxygen Continuous    Liters 2      NuStep   Level 1    SPM 60    Minutes 39      Prescription Details   Frequency (times per week) 2    Duration Progress to 30 minutes of continuous aerobic without signs/symptoms of physical distress      Intensity   THRR 40-80% of Max Heartrate 62-125    Ratings of Perceived Exertion 11-13    Perceived Dyspnea 0-4      Resistance Training   Training Prescription Yes    Weight 1    Reps 10-15           Discharge Exercise Prescription (Final Exercise Prescription Changes):  Exercise Prescription Changes - 07/14/20 1223      Response to Exercise   Blood Pressure (Admit) 100/58    Blood Pressure (Exercise) 120/62    Blood Pressure (Exit) 102/68    Heart Rate (Admit) 95 bpm    Heart Rate (Exercise) 96 bpm    Heart Rate (Exit) 76 bpm    Oxygen Saturation (Admit) 90 %     Oxygen Saturation (Exercise) 90 %    Oxygen Saturation (Exit) 97 %    Rating of Perceived Exertion (Exercise) 13    Perceived Dyspnea (Exercise) 12    Duration Continue with 30 min of aerobic exercise without signs/symptoms of physical distress.    Intensity THRR unchanged      Progression   Progression Continue to progress workloads to maintain intensity without signs/symptoms of physical distress.      Resistance Training   Training Prescription Yes    Weight 2    Reps 10-15    Time 10 Minutes      Oxygen   Oxygen Continuous    Liters 2      NuStep   Level 1    SPM 44    Minutes 39    METs 1.7           Functional Capacity:  6 Minute Walk    Row Name 06/25/20 1012  6 Minute Walk   Phase Initial     Distance 400 feet     Walk Time 6 minutes     # of Rest Breaks 2     MPH 0.76     METS 1.59     RPE 5.55     Perceived Dyspnea  11     VO2 Peak 11     Symptoms Yes (comment)     Comments Pt took a seated rest break from 1.5 minutes after 200 ft due to SOB, leg fatigue, and back pain. He resumed walking for 200 more feet and reported 8/10 chest pain. The test was terminated 1 minute 15 seconds early due to this. The chest pain is brought on by exertion, and went away with rest.     Resting HR 80 bpm     Resting BP 90/60     Resting Oxygen Saturation  98 %     Exercise Oxygen Saturation  during 6 min walk 91 %     Max Ex. HR 99 bpm     Max Ex. BP 86/58     2 Minute Post BP 102/64       Interval HR   1 Minute HR 94     2 Minute HR 96     3 Minute HR 94     4 Minute HR 97     5 Minute HR 99     6 Minute HR 97     2 Minute Post HR 83     Interval Heart Rate? Yes       Interval Oxygen   Interval Oxygen? Yes     Baseline Oxygen Saturation % 98 %     1 Minute Oxygen Saturation % 97 %     1 Minute Liters of Oxygen 2 L     2 Minute Oxygen Saturation % 94 %     2 Minute Liters of Oxygen 2 L     3 Minute Oxygen Saturation % 91 %     3 Minute Liters  of Oxygen 2 L     4 Minute Oxygen Saturation % 91 %     4 Minute Liters of Oxygen 2 L     5 Minute Oxygen Saturation % 91 %     5 Minute Liters of Oxygen 2 L     6 Minute Oxygen Saturation % 92 %     6 Minute Liters of Oxygen 2 L     2 Minute Post Oxygen Saturation % 93 %     2 Minute Post Liters of Oxygen 2 L            Psychological, QOL, Others - Outcomes: PHQ 2/9: Depression screen [facility] 2/9 06/25/2020 03/10/2020 02/24/2020 02/17/2020 01/15/2020  Decreased Interest 0 0 0 0 0  Down, Depressed, Hopeless 0 0 0 0 0  PHQ - 2 Score 0 0 0 0 0  Altered sleeping 2 - - - -  Tired, decreased energy 2 - - - -  Change in appetite 0 - - - -  Feeling bad or failure about yourself  0 - - - -  Trouble concentrating 0 - - - -  Moving slowly or fidgety/restless 0 - - - -  Suicidal thoughts 0 - - - -  PHQ-9 Score 4 - - - -  Difficult doing work/chores Somewhat difficult - - - -  Some recent data might be hidden    Quality of Life:  Quality of Life - 06/25/20 1018      Quality of Life   Select Quality of Life      Quality of Life Scores   Health/Function Pre 12.66 %    Socioeconomic Pre 16.58 %    Psych/Spiritual Pre 26.21 %    Family Pre 26.75 %    GLOBAL Pre 17.95 %           Personal Goals: Goals established at orientation with interventions provided to work toward goal.  Personal Goals and Risk Factors at Admission - 06/25/20 0938      Core Components/Risk Factors/Patient Goals on Admission    Weight Management Weight Maintenance    Improve shortness of breath with ADL's Yes    Intervention Provide education, individualized exercise plan and daily activity instruction to help decrease symptoms of SOB with activities of daily living.    Expected Outcomes Short Term: Improve cardiorespiratory fitness to achieve a reduction of symptoms when performing ADLs;Long Term: Be able to perform more ADLs without symptoms or delay the onset of symptoms    Personal Goal Other Yes    Personal  Goal Get stronger; breathe better; be able to do ADL's with less SOB; get off of O2 eventually.    Intervention Patient will attend PR 2 days/week and supplement with exercise at home 3 days/week.    Expected Outcomes Patient will meet both personal and program goals.            Personal Goals Discharge:   Exercise Goals and Review:  Exercise Goals    Row Name 06/25/20 1017 07/07/20 1231           Exercise Goals   Increase Physical Activity Yes Yes      Intervention Provide advice, education, support and counseling about physical activity/exercise needs.;Develop an individualized exercise prescription for aerobic and resistive training based on initial evaluation findings, risk stratification, comorbidities and participant's personal goals. Provide advice, education, support and counseling about physical activity/exercise needs.;Develop an individualized exercise prescription for aerobic and resistive training based on initial evaluation findings, risk stratification, comorbidities and participant's personal goals.      Expected Outcomes Short Term: Attend rehab on a regular basis to increase amount of physical activity.;Long Term: Add in home exercise to make exercise part of routine and to increase amount of physical activity.;Long Term: Exercising regularly at least 3-5 days a week. Short Term: Attend rehab on a regular basis to increase amount of physical activity.;Long Term: Add in home exercise to make exercise part of routine and to increase amount of physical activity.;Long Term: Exercising regularly at least 3-5 days a week.      Increase Strength and Stamina Yes Yes      Intervention Provide advice, education, support and counseling about physical activity/exercise needs.;Develop an individualized exercise prescription for aerobic and resistive training based on initial evaluation findings, risk stratification, comorbidities and participant's personal goals. Provide advice,  education, support and counseling about physical activity/exercise needs.;Develop an individualized exercise prescription for aerobic and resistive training based on initial evaluation findings, risk stratification, comorbidities and participant's personal goals.      Expected Outcomes Short Term: Increase workloads from initial exercise prescription for resistance, speed, and METs.;Short Term: Perform resistance training exercises routinely during rehab and add in resistance training at home;Long Term: Improve cardiorespiratory fitness, muscular endurance and strength as measured by increased METs and functional capacity (6MWT) Short Term: Increase workloads from initial exercise prescription for resistance, speed, and METs.;Short Term: Perform resistance  training exercises routinely during rehab and add in resistance training at home;Long Term: Improve cardiorespiratory fitness, muscular endurance and strength as measured by increased METs and functional capacity (6MWT)      Able to understand and use rate of perceived exertion (RPE) scale Yes Yes      Intervention Provide education and explanation on how to use RPE scale Provide education and explanation on how to use RPE scale      Expected Outcomes Short Term: Able to use RPE daily in rehab to express subjective intensity level;Long Term:  Able to use RPE to guide intensity level when exercising independently Short Term: Able to use RPE daily in rehab to express subjective intensity level;Long Term:  Able to use RPE to guide intensity level when exercising independently      Able to understand and use Dyspnea scale Yes Yes      Intervention Provide education and explanation on how to use Dyspnea scale Provide education and explanation on how to use Dyspnea scale      Expected Outcomes Short Term: Able to use Dyspnea scale daily in rehab to express subjective sense of shortness of breath during exertion;Long Term: Able to use Dyspnea scale to guide  intensity level when exercising independently Short Term: Able to use Dyspnea scale daily in rehab to express subjective sense of shortness of breath during exertion;Long Term: Able to use Dyspnea scale to guide intensity level when exercising independently      Knowledge and understanding of Target Heart Rate Range (THRR) Yes Yes      Intervention Provide education and explanation of THRR including how the numbers were predicted and where they are located for reference Provide education and explanation of THRR including how the numbers were predicted and where they are located for reference      Expected Outcomes Short Term: Able to state/look up THRR;Long Term: Able to use THRR to govern intensity when exercising independently;Short Term: Able to use daily as guideline for intensity in rehab Short Term: Able to state/look up THRR;Long Term: Able to use THRR to govern intensity when exercising independently;Short Term: Able to use daily as guideline for intensity in rehab      Understanding of Exercise Prescription Yes Yes      Intervention Provide education, explanation, and written materials on patient's individual exercise prescription Provide education, explanation, and written materials on patient's individual exercise prescription      Expected Outcomes Short Term: Able to explain program exercise prescription;Long Term: Able to explain home exercise prescription to exercise independently Short Term: Able to explain program exercise prescription;Long Term: Able to explain home exercise prescription to exercise independently             Exercise Goals Re-Evaluation:  Exercise Goals Re-Evaluation    Row Name 07/07/20 1231             Exercise Goal Re-Evaluation   Exercise Goals Review Increase Physical Activity;Able to understand and use rate of perceived exertion (RPE) scale;Increase Strength and Stamina;Able to understand and use Dyspnea scale;Knowledge and understanding of Target Heart  Rate Range (THRR);Understanding of Exercise Prescription       Comments Pt will begin exercise 10/28. I held him out for his first few scheduled visits because he had 8/10 chest pain during his walk test. He has had a work up with cardiology and they do not believe that this pain is due to any heart blockages. Will monitor and progress as able once he begins the program.  Expected Outcomes Through exercise at rehab and by engaging in a home exercise program, the pt will reach their goals.              Nutrition & Weight - Outcomes:  Pre Biometrics - 06/25/20 1017      Pre Biometrics   Height _0  (1.6 m)    Weight 53.3 kg    Waist Circumference 32.5 inches    Hip Circumference 34.5 inches    Waist to Hip Ratio 0.94 %    BMI (Calculated) 20.82    Triceps Skinfold 9 mm    % Body Fat 19.8 %    Grip Strength 28.3 kg    Flexibility 0 in    Single Leg Stand 60 seconds            Nutrition:  Nutrition Therapy & Goals - 06/25/20 0933      Personal Nutrition Goals   Comments Patient scored 30 on his medficts diet assessemt scoreing highest in eggs. He says he eats 2 eggs/day. Reviewed score with patient and handout given and reviewed regarding making healthier choices. He says he is not interested in changing his diet. He is being monitored for pre-diabetes. Fasting glucose this am 96 mg/dl. He says he watches his sugar intake. Will continue to monitor.      Intervention Plan   Intervention Nutrition handout(s) given to patient.           Nutrition Discharge:  Nutrition Assessments - 06/25/20 0937      MEDFICTS Scores   Pre Score 30           Education Questionnaire Score:  Knowledge Questionnaire Score - 06/25/20 0937      Knowledge Questionnaire Score   Pre Score 12/18          Patient expired 07/27/20 at [facility].

## 2020-07-30 ENCOUNTER — Other Ambulatory Visit: Payer: Self-pay | Admitting: *Deleted

## 2020-07-30 ENCOUNTER — Ambulatory Visit: Payer: Medicare Other | Admitting: Gastroenterology

## 2020-07-30 ENCOUNTER — Encounter (HOSPITAL_COMMUNITY): Payer: Medicare Other

## 2020-07-30 MED FILL — Medication: Qty: 1 | Status: AC

## 2020-07-30 NOTE — Patient Outreach (Signed)
[facility] Care Management  07/30/2020  JOCK MAHON 1956/04/15 [id number]   [facility] Care case closure/coordination  With review of Epic chart noted patient passed on 2020/08/21 pronounced Aug 21, 2020 1330   Plan [facility] case closed Note to listed care team MDs  [name], RN, BSN, [place] Coordinator Office number [phone] Mobile number [phone]  Main [facility] number [id number] Fax number [phone]

## 2020-08-04 ENCOUNTER — Encounter (HOSPITAL_COMMUNITY): Payer: Medicare Other

## 2020-08-05 LAB — GLUCOSE, CAPILLARY: Glucose-Capillary: 212 mg/dL — ABNORMAL HIGH (ref 70–99)

## 2020-08-06 ENCOUNTER — Encounter (HOSPITAL_COMMUNITY): Payer: Medicare Other

## 2020-08-11 ENCOUNTER — Encounter (HOSPITAL_COMMUNITY): Payer: Medicare Other

## 2020-08-12 NOTE — Death Summary Note (Signed)
DEATH SUMMARY   Patient Details  Name: Bryan [name] MRN: [id number] DOB: 01-26-1956  Admission/Discharge Information   Admit Date:  07/26/2020  Date of Death: Date of Death: Jul 30, 2020  Time of Death: Time of Death: October 22, 1328  Length of Stay: 3  Referring Physician: Wynonia Hazard, NP   Reason(s) for Hospitalization  Acute kidney injury on chronic kidney disease, stage IIIa  Diagnoses  Preliminary cause of death: Obstructive shock along with acute hypercarbic/hypoxic respiratory failure secondary to acute kidney injury on chronic kidney disease, stage IIIa   Secondary Diagnoses (including complications and co-morbidities):  Hyperkalemia Hypomagnesemia Acute hypoxic respiratory failure Acute metabolic encephalopathy Chronic abdominal pain COPD/emphysema with chronic hypoxic respiratory failure/pulmonary hypertension Possible sleep apnea Diabetes mellitus, type II with neuropathy Goals of care  Brief Hospital Course (including significant findings, care, treatment, and services provided and events leading to death)  HPI on 07/27/20 by Dr. Carlynn Purl  [name]  is a 64 y.o. male, with history of tubular adenoma, CVA, diabetes mellitus type 2, NSTEMI in March 2021, hypertension, epigastric pain, COPD, acid reflux, and more presents the ED with a chief complaint of abdominal pain.  Reports abdominal pain that follows the distribution to a band around the top of his abdomen.  He reports it is sharp and burning.  It happens every day all throughout the day.  Last for 15 to 20 minutes when it is there.  It is worse with ambulation, and bearing down for a bowel movement.  Patient reports that the onset was approximately a year ago.  He has not been vomiting.  He reports bowel movements are regular.  He has been following with GI.  They have advised no NSAIDs, and they switched his Protonix to [place] yesterday.  Patient reports that he does not believe he has adequate pain control  his abdominal pain at home.  As for his shortness of breath, it is worse with deep inspiration as it makes his abdomen hurt.  He does report that he is short of breath every day, and today is the same as his baseline.  He wears 2.5 L of oxygen at home, and he is maintaining his saturations on 2.5 L of oxygen here.  Patient reports his dyspnea is worse with exertion, as it always is.  Again he denies chest pain.  Patient reports that he does not smoke, does not drink, does not use illicit drugs, is not vaccinated for Covid.  He would like to be DNR.  Patient is actually being admitted because blood work revealed an AKI.  He seems to have had an uptrending creatinine over the last 2 draws, but a month ago his creatinine was 2.5 and today is 3.6.  Patient reports that he has polyuria, and voids 8 or 9 times at night.  This has been going on for a long time.  He has not had any dysuria.  He has not noticed any hematuria.  He has not had any decrease in urine output.  His urine habits are at his baseline.  He reports that he has not been taking large amounts of NSAIDs.  He has been eating and drinking normally at home.   Brief/Interim history 64 year old male with a history of chronic abdominal pain followed by gastroenterology, diabetes mellitus, NSTEMI March 2021, COPD with chronic hypoxic respiratory failure presented with complaints of shortness of breath and abdominal pain.  Patient found to have acute kidney injury.  Nephrology and palliative care also consulted.  Nephrology  wanted to start hemodialysis.  Attempted hemodialysis on 07/27/2020 however patient continued to have hypotension.  Patient was transferred to ICU for initiation of CRRT.  Shortly thereafter, while ICU attending was at bedside, patient became unresponsive and bradycardic along with hypotension.  Bedside echocardiogram was done showing an enlarged RV with D-shaped septum and very large LV.  Given findings which were consistent with  obstructive shock, TNKase was administered.  Patient was emergently intubated.  Patient was placed on pressors which were rapidly titrated to max doses (see Levophed and Neo-Synephrine).  He remained hypotensive and epinephrine was given.  Blood pressure improved however patient developed SVT in the 160s.  Repeat echocardiogram continued to show dilated RV with no improvement.  Hyperkalemia was treated with an amp of calcium as well as one amp of bicarb.  ICU physician at that time discussed with patient's son, no cardioversion or CPR orders were issued.  Patient expired at 1330.   Acute kidney injury on chronic kidney disease, stage IIIa with anion gap metabolic acidosis -Unclear etiology -Patient with hypotensive on admission -Torsemide as well as Dexilant held -Creatinine peaked to 7.58  (baseline creatinine approximately 1.5-2 although he presented with an creatinine of 3.6). Creatinine improved to 6.09 -Nephrology consulted and appreciated,-UA unremarkable -Discussed with Dr. Justin Mend, IJ catheter placed for initiation of dialysis; started HD on 07/27/2020 however patient did have hypotension.  Discussed with Dr. Justin Mend, patient was transferred to ICU for initiation of CRRT -Was placed on bicarb  Hyperkalemia -Secondary to acute kidney injury -Mildly improved -Patient had received Lokelma that was placed on Kayexalate as he was unable to participate in oral intake of Lokelma -HD/CRRT was initiated  Hypomagnesemia -Replaced  Acute hypoxic/hypercarbic respiratory failure -Overnight, patient required 4 L of nasal cannula -VBG was obtained shows pH of 7.117, PCO2 65.8, PO2 less than 31.0, bicarb 16 -Currently on BiPAP.  ABG this morning appears to be mildly improved with pH 7.279, PCO2 51.1, PO2 75.8, bicarb 81.8  Acute metabolic encephalopathy -Likely multifactorial including renal failure versus hypoxia/hypercapnia  Chronic abdominal pain -Patient with a history of GERD, PUD, severe  grade D reflux esophagitis, dysphagia, PVD without motility issues, constipation. Patient follows with gastroenterology and was seen on 07/23/2020 at which time outpatient EGD was planned. He was tried on a trial of Dexilant, Levsin and Linzess.  Bentyl was discontinued.  He was referred to vascular surgery for abnormalities found on MRI.  Abnormalities were not thought to be contributory to pain or represent chronic mesenteric ischemia. -Minimally elevated lipase  COPD/emphysema with chronic hypoxic respiratory failure/pulmonary hypertension -Chest x-ray showed no acute infection -Patient was placed on BiPAP -At baseline, patient was on 2 L of home oxygen  Possible sleep apnea -Pulmonology arranging outpatient sleep study  Diabetes mellitus, type II with neuropathy -Hemoglobin A1c 6.7 on June 2021 -Continued gabapentin however dose decreased due to renal failure  Goals of care -Patient currently DNR -Palliative care consulted and appreciated  Pertinent Labs and Studies  Significant Diagnostic Studies US RENAL  Result Date: 07/25/2020 CLINICAL DATA:  Acute kidney injury, abdominal pain EXAM: RENAL / URINARY TRACT ULTRASOUND COMPLETE COMPARISON:  Recent abdominal MRI 07/17/2020 FINDINGS: Right Kidney: Renal measurements: 9.2 x 4.4 x 5.2 cm = volume: 110 mL. The parenchyma is mildly echogenic. There is increased conspicuity between the corticomedullary interface. No mass or hydronephrosis visualized. Left Kidney: Renal measurements: 8.5 x 4.9 x 4.8 cm = volume: 105 mL. The parenchyma is mildly echogenic. There is increased conspicuity between the corticomedullary  interface. No mass or hydronephrosis visualized. Bladder: Appears normal for degree of bladder distention. Other: None. IMPRESSION: 1. No evidence of hydronephrosis. 2. Mildly echogenic renal parenchyma suggests underlying medical renal disease. Electronically Signed   By: Jacqulynn Cadet M.D.   On: 07/25/2020 10:45   MR ANGIO  ABDOMEN W WO CONTRAST  Result Date: 07/17/2020 CLINICAL DATA:  Abdominal pain EXAM: MRA ABDOMEN AND PELVIS WITH CONTRAST TECHNIQUE: Multiplanar, multiecho pulse sequences of the abdomen and pelvis were obtained with intravenous contrast. Angiographic images of abdomen and pelvis were obtained using MRA technique with intravenous contrast. CONTRAST:  1m GADAVIST GADOBUTROL 1 MMOL/ML IV SOLN COMPARISON:  CTA chest 04/19/20 FINDINGS: MRA ABDOMEN FINDINGS Aorta: Normal caliber aorta.  No evidence of dissection or aneurysm. Celiac: Patent and unremarkable. No significant stenosis. Conventional hepatic arterial anatomy. SMA: Intermediate length moderate stenosis of the proximal SMA beginning approximately 2 cm beyond the origin. Renals: Solitary renal arteries bilaterally. No significant stenosis. IMA: Patent and unremarkable. Inflow: Focal moderate stenosis of the right common iliac artery secondary to calcified atherosclerotic plaque. Significant stenoses are present at the origins of the bilateral internal iliac arteries. There appears to be a focal penetrating ulceration versus non flow limiting dissection in the patulous left common carotid artery. Query changes of prior left common femoral endarterectomy. MRI ABDOMEN FINDINGS Nonspecific thickening of the gastric mucosa along the greater curvature of the stomach measuring up to 2.6 cm in width. No underlying signal abnormality. No focal signal abnormality or abnormal enhancement in the visualized lower chest. No focal signal abnormality or abnormal enhancement within the visualized abdomen. Surgical changes of prior left posterior lumbar interbody fusion. IMPRESSION: VASCULAR 1. There is a moderate stenosis of the proximal superior mesenteric artery. It would be unlikely for this isolated lesion to result in symptoms of chronic mesenteric ischemia. 2. Widely patent celiac and inferior mesenteric arteries. 3. Focal moderate stenosis of the right common iliac artery.  4. Significant stenoses of the origins of the bilateral internal iliac arteries. 5. Penetrating atherosclerotic ulcer versus non flow limiting dissection in the patulous left common femoral artery. Query prior surgical history of left common femoral endarterectomy? NON VASCULAR 1. Nonspecific mucosal thickening along the greater curvature of the stomach. Findings are most consistent with gastritis. However, considerably less likely considerations include gastric lymphoma or adenocarcinoma. Consider outpatient referral to gastroenterology for upper endoscopy if not recently performed. 2. No other focal abnormality by MRI within the abdomen or pelvis. Electronically Signed   By: HJacqulynn CadetM.D.   On: 07/17/2020 16:43   IR Fluoro Guide CV Line Right  Result Date: 07/27/2020 INDICATION: 64year old male with history of acute kidney injury requiring hemodialysis. EXAM: NON-TUNNELED CENTRAL VENOUS HEMODIALYSIS CATHETER PLACEMENT WITH ULTRASOUND AND FLUOROSCOPIC GUIDANCE COMPARISON:  None. MEDICATIONS: None FLUOROSCOPY TIME:  0 minutes, 12 seconds (1 mGy) COMPLICATIONS: None immediate. PROCEDURE: Informed written consent was obtained from the patient after a discussion of the risks, benefits, and alternatives to treatment. Questions regarding the procedure were encouraged and answered. The right neck and chest were prepped with chlorhexidine in a sterile fashion, and a sterile drape was applied covering the operative field. Maximum barrier sterile technique with sterile gowns and gloves were used for the procedure. A timeout was performed prior to the initiation of the procedure. After the overlying soft tissues were anesthetized, a small venotomy incision was created and a micropuncture kit was utilized to access the internal jugular vein. Real-time ultrasound guidance was utilized for vascular access including the acquisition  of a permanent ultrasound image documenting patency of the accessed vessel. The  microwire was utilized to measure appropriate catheter length. A stiff glidewire was advanced to the level of the IVC. Under fluoroscopic guidance, the venotomy was serially dilated, ultimately allowing placement of a 16 cm temporary triple-lumen non tunneled hemodialysis catheter with tip ultimately terminating within the superior aspect of the right atrium. Final catheter positioning was confirmed and documented with a spot radiographic image. The catheter aspirates and flushes normally. The catheter was flushed with appropriate volume heparin dwells. The catheter exit site was secured with a 0-silk retention suture. A dressing was placed. The patient tolerated the procedure well without immediate post procedural complication. IMPRESSION: Successful placement of a right internal jugular approach 16 cm temporary dialysis catheter with tip terminating with in the superior aspect of the right atrium. The catheter is ready for immediate use. PLAN: This catheter may be converted to a tunneled dialysis catheter at a later date as indicated. Ruthann Cancer, MD [facility] Radiology Electronically Signed   By: Ruthann Cancer MD   On: 07/27/2020 14:06   IR US Guide Vasc Access Right  Result Date: 2020/08/11 INDICATION: 64 year old male with history of acute kidney injury requiring hemodialysis. EXAM: NON-TUNNELED CENTRAL VENOUS HEMODIALYSIS CATHETER PLACEMENT WITH ULTRASOUND AND FLUOROSCOPIC GUIDANCE COMPARISON:  None. MEDICATIONS: None FLUOROSCOPY TIME:  0 minutes, 12 seconds (1 mGy) COMPLICATIONS: None immediate. PROCEDURE: Informed written consent was obtained from the patient after a discussion of the risks, benefits, and alternatives to treatment. Questions regarding the procedure were encouraged and answered. The right neck and chest were prepped with chlorhexidine in a sterile fashion, and a sterile drape was applied covering the operative field. Maximum barrier  sterile technique with sterile gowns and gloves were used for the procedure. A timeout was performed prior to the initiation of the procedure. After the overlying soft tissues were anesthetized, a small venotomy incision was created and a micropuncture kit was utilized to access the internal jugular vein. Real-time ultrasound guidance was utilized for vascular access including the acquisition of a permanent ultrasound image documenting patency of the accessed vessel. The microwire was utilized to measure appropriate catheter length. A stiff glidewire was advanced to the level of the IVC. Under fluoroscopic guidance, the venotomy was serially dilated, ultimately allowing placement of a 16 cm temporary triple-lumen non tunneled hemodialysis catheter with tip ultimately terminating within the superior aspect of the right atrium. Final catheter positioning was confirmed and documented with a spot radiographic image. The catheter aspirates and flushes normally. The catheter was flushed with appropriate volume heparin dwells. The catheter exit site was secured with a 0-silk retention suture. A dressing was placed. The patient tolerated the procedure well without immediate post procedural complication. IMPRESSION: Successful placement of a right internal jugular approach 16 cm temporary dialysis catheter with tip terminating with in the superior aspect of the right atrium. The catheter is ready for immediate use. PLAN: This catheter may be converted to a tunneled dialysis catheter at a later date as indicated. Ruthann Cancer, MD [name] Radiology Electronically Signed   By: Ruthann Cancer MD   On: 07/27/2020 14:06   Portable Chest x-ray  Result Date: Aug 11, 2020 CLINICAL DATA:  64 year old male status post intubation. EXAM: PORTABLE CHEST 1 VIEW COMPARISON:  Chest x-ray 07/27/2020. FINDINGS: An endotracheal tube is in place with tip 9 mm above the carina. Right IJ  Vas-Cath with tip terminating in the distal superior  vena cava. Transcutaneous defibrillator pads projecting over the lower mediastinum and left hemithorax. Cephalization of the pulmonary vasculature, without frank pulmonary edema. No acute consolidative airspace disease. No pleural effusions. No pneumothorax. Heart size is borderline enlarged. Upper mediastinal contours are within normal limits. Orthopedic fixation hardware in the lower cervical spine. IMPRESSION: 1. Support apparatus, as above. Retraction of the endotracheal tube approximately 7 cm is recommended for more optimal placement just below the level of the thoracic inlet. 2. Borderline cardiomegaly with pulmonary venous congestion, but no frank pulmonary edema. Electronically Signed   By: Vinnie Langton M.D.   On: 13-Aug-2020 12:54   DG CHEST PORT 1 VIEW  Result Date: Aug 13, 2020 CLINICAL DATA:  Dyspnea EXAM: PORTABLE CHEST 1 VIEW COMPARISON:  07/17/2020 FINDINGS: Right internal jugular central venous catheter has been placed with its tip overlying the superior vena cava. Lungs are clear. No pneumothorax or pleural effusion. Cardiac size within normal limits. Pulmonary vascularity is normal. No acute bone abnormality. IMPRESSION: Right internal jugular central venous catheter tip overlying the superior vena cava. No pneumothorax. Electronically Signed   By: Fidela Salisbury MD   On: 2020/08/13 00:05   DG Chest Portable 1 View  Result Date: 07/21/2020 CLINICAL DATA:  Chest pain, shortness of breath EXAM: PORTABLE CHEST 1 VIEW COMPARISON:  06/23/2020 FINDINGS: Single frontal view of the chest demonstrates a stable cardiac silhouette. Background emphysema unchanged, without airspace disease, effusion, or pneumothorax. Continued prominence of the hilar vasculature may reflect underlying pulmonary arterial hypertension. IMPRESSION: 1. Emphysema, no acute intrathoracic process. Electronically Signed   By: Randa Ngo M.D.   On: 07/22/2020 22:32     Microbiology No results found for this or any previous visit (from the past 240 hour(s)).  Lab Basic Metabolic Panel: No results for input(s): NA, K, CL, CO2, GLUCOSE, BUN, CREATININE, CALCIUM, MG, PHOS in the last 168 hours. Liver Function Tests: No results for input(s): AST, ALT, ALKPHOS, BILITOT, PROT, ALBUMIN in the last 168 hours. No results for input(s): LIPASE, AMYLASE in the last 168 hours. No results for input(s): AMMONIA in the last 168 hours. CBC: No results for input(s): WBC, NEUTROABS, HGB, HCT, MCV, PLT in the last 168 hours. Cardiac Enzymes: No results for input(s): CKTOTAL, CKMB, CKMBINDEX, TROPONINI in the last 168 hours. Sepsis Labs: No results for input(s): PROCALCITON, WBC, LATICACIDVEN in the last 168 hours.  Procedures/Operations  Renal US Intubation HD  Cristal Ford 08/05/2020, 3:37 PM

## 2020-08-12 NOTE — Progress Notes (Signed)
[facility] ROUNDING NOTE   Subjective:   Brief history 64 year old history of CVA diabetes mellitus NSTEMI 11/2019 history of hypertension COPD on oxygen reflux disease history of tubular adenoma presented to [facility] with abdominal pain was found to have an increasing creatinine 3.6 mg/dL.  Baseline about 1.5 to 2 mg/dL blood pressures were low at 76/66 but improved.  Potassium treated with lokelma.  Urinalysis negative for blood 1+ protein consistent with prior UAs.  Sodium of 111 Fina 0.2 renal ultrasound no evidence of hydronephrosis.  MRI has no evidence of renal artery stenosis.  Received dialysis 07/27/2020 with 900 cc removed.  Minimal urine output  Blood pressure 92/62 pulse 72 temperature 98.8 O2 sats 99% BiPAP 40%  Sodium 134 potassium 5.8 chloride 95 CO2 25 BUN 72 creatinine 6.09 glucose 109 calcium 7 phosphorus 6.5 albumin 3 hemoglobin 10.2   Objective:  Vital signs in last 24 hours:  Temp:  [97.6 F (36.4 C)-98.8 F (37.1 C)] 98.8 F (37.1 C) (11/16 0300) Pulse Rate:  [58-77] 76 (11/16 0530) Resp:  [0-36] 27 (11/16 0530) BP: (78-110)/(51-77) 95/65 (11/16 0530) SpO2:  [92 %-100 %] 98 % (11/16 0530) FiO2 (%):  [40 %] 40 % (11/16 0102) Weight:  [61.7 kg] 61.7 kg (11/15 1540)  Weight change: -0.8 kg Filed Weights   07/27/20 0451 07/27/20 0600 07/27/20 1540  Weight: 62.5 kg 60.4 kg 61.7 kg    Intake/Output: I/O last 3 completed shifts: In: 3100.8 [I.V.:3050.8; IV Piggyback:50] Out: 906 [Urine:150; Other:756]   Intake/Output this shift:  No intake/output data recorded.  CVS- RRR no murmurs rubs or gallops RS- CTA soft nontender ABD- BS present soft non-distended EXT- no edema Foley catheter in place   Basic Metabolic Panel: Recent Labs  Lab 07/25/20 0412 07/25/20 0412 07/26/20 0419 07/26/20 0419 07/26/20 1209 07/26/20 1209 07/26/20 2341 07/26/20 2341 07/27/20 0745 07/27/20 1237 2020-08-24 0218  NA 132*   < > 134*   < >  134*  --  132*  --  131* 133* 134*  K 6.2*   < > 6.3*   < > 6.4*  --  6.8*  --  6.2* 5.8* 5.8*  CL 97*   < > 96*   < > 96*  --  97*  --  93* 96* 95*  CO2 21*   < > 21*   < > 17*  --  18*  --  _0 GLUCOSE 84   < > 147*   < > 138*  --  143*  --  245* 176* 109*  BUN 69*   < > 93*   < > 96*  --  104*  --  108* 108* 72*  CREATININE 3.56*   < > 5.61*   < > 6.03*  --  7.27*  --  7.29* 7.58* 6.09*  CALCIUM 8.0*   < > 7.3*   < > 7.7*   < > 6.8*   < > 6.5* 6.7* 7.0*  MG 1.4*  --  1.9  --   --   --   --   --   --   --   --   PHOS  --   --  9.1*  --   --   --   --   --  9.0* 8.6* 6.5*   < > = values in this interval not displayed.    Liver Function Tests: Recent Labs  Lab 08/09/2020 2344 07/25/20 0412 07/27/20 0745 07/27/20 1237 August 24, 2020  0218  AST 33 41  --   --   --   ALT 29 34  --   --   --   ALKPHOS 90 86  --   --   --   BILITOT 0.5 0.6  --   --   --   PROT 7.6 7.4  --   --   --   ALBUMIN 4.0 3.9 3.5 3.3* 3.3*   Recent Labs  Lab 07/13/2020 2344  LIPASE 61*   No results for input(s): AMMONIA in the last 168 hours.  CBC: Recent Labs  Lab 07/30/2020 2344 07/25/20 0412 07/27/20 0745 11-Aug-2020 0218  WBC 6.5 6.8 10.6* 13.1*  NEUTROABS 4.1 5.4  --   --   HGB 12.2* 11.5* 9.9* 10.2*  HCT 41.8 41.0 33.5* 34.6*  MCV 77.7* 79.3* 77.5* 77.1*  PLT 348 358 242 141*    Cardiac Enzymes: No results for input(s): CKTOTAL, CKMB, CKMBINDEX, TROPONINI in the last 168 hours.  BNP: Invalid input(s): POCBNP  CBG: Recent Labs  Lab 07/27/20 1230 07/27/20 1855 07/27/20 2025 07/27/20 2321 11-Aug-2020 0555  GLUCAP 173* 119* 131* 114* 120*    Microbiology: Results for orders placed or performed during the hospital encounter of 07/22/2020  Respiratory Panel by RT PCR (Flu A&B, Covid) - Nasopharyngeal Swab     Status: None   Collection Time: 07/15/2020 10:22 PM   Specimen: Nasopharyngeal Swab  Result Value Ref Range Status   SARS Coronavirus 2 by RT PCR NEGATIVE NEGATIVE Final    Comment:  (NOTE) SARS-CoV-2 target nucleic acids are NOT DETECTED.  The SARS-CoV-2 RNA is generally detectable in upper respiratoy specimens during the acute phase of infection. The lowest concentration of SARS-CoV-2 viral copies this assay can detect is 131 copies/mL. A negative result does not preclude SARS-Cov-2 infection and should not be used as the sole basis for treatment or other patient management decisions. A negative result may occur with  improper specimen collection/handling, submission of specimen other than nasopharyngeal swab, presence of viral mutation(s) within the areas targeted by this assay, and inadequate number of viral copies (<131 copies/mL). A negative result must be combined with clinical observations, patient history, and epidemiological information. The expected result is Negative.  Fact Sheet for Patients:  PinkCheek.be  Fact Sheet for Healthcare Providers:  GravelBags.it  This test is no t yet approved or cleared by the [place] FDA and  has been authorized for detection and/or diagnosis of SARS-CoV-2 by FDA under an Emergency Use Authorization (EUA). This EUA will remain  in effect (meaning this test can be used) for the duration of the COVID-19 declaration under Section 564(b)(1) of the Act, 21 U.S.C. section 360bbb-3(b)(1), unless the authorization is terminated or revoked sooner.     Influenza A by PCR NEGATIVE NEGATIVE Final   Influenza B by PCR NEGATIVE NEGATIVE Final    Comment: (NOTE) The Xpert Xpress SARS-CoV-2/FLU/RSV assay is intended as an aid in  the diagnosis of influenza from Nasopharyngeal swab specimens and  should not be used as a sole basis for treatment. Nasal washings and  aspirates are unacceptable for Xpert Xpress SARS-CoV-2/FLU/RSV  testing.  Fact Sheet for Patients: PinkCheek.be  Fact Sheet for Healthcare  Providers: GravelBags.it  This test is not yet approved or cleared by the [place] FDA and  has been authorized for detection and/or diagnosis of SARS-CoV-2 by  FDA under an Emergency Use Authorization (EUA). This EUA will remain  in effect (meaning this test can be used)  for the duration of the  Covid-19 declaration under Section 564(b)(1) of the Act, 21  U.S.C. section 360bbb-3(b)(1), unless the authorization is  terminated or revoked. Performed at [facility], [address], [place], [place] [address]     Coagulation Studies: No results for input(s): LABPROT, INR in the last 72 hours.  Urinalysis: No results for input(s): COLORURINE, LABSPEC, PHURINE, GLUCOSEU, HGBUR, BILIRUBINUR, KETONESUR, PROTEINUR, UROBILINOGEN, NITRITE, LEUKOCYTESUR in the last 72 hours.  Invalid input(s): APPERANCEUR    Imaging: IR Fluoro Guide CV Line Right  Result Date: 07/27/2020 INDICATION: 64 year old male with history of acute kidney injury requiring hemodialysis. EXAM: NON-TUNNELED CENTRAL VENOUS HEMODIALYSIS CATHETER PLACEMENT WITH ULTRASOUND AND FLUOROSCOPIC GUIDANCE COMPARISON:  None. MEDICATIONS: None FLUOROSCOPY TIME:  0 minutes, 12 seconds (1 mGy) COMPLICATIONS: None immediate. PROCEDURE: Informed written consent was obtained from the patient after a discussion of the risks, benefits, and alternatives to treatment. Questions regarding the procedure were encouraged and answered. The right neck and chest were prepped with chlorhexidine in a sterile fashion, and a sterile drape was applied covering the operative field. Maximum barrier sterile technique with sterile gowns and gloves were used for the procedure. A timeout was performed prior to the initiation of the procedure. After the overlying soft tissues were anesthetized, a small venotomy incision was created and a micropuncture kit was utilized to access the internal jugular vein. Real-time ultrasound guidance was  utilized for vascular access including the acquisition of a permanent ultrasound image documenting patency of the accessed vessel. The microwire was utilized to measure appropriate catheter length. A stiff glidewire was advanced to the level of the IVC. Under fluoroscopic guidance, the venotomy was serially dilated, ultimately allowing placement of a 16 cm temporary triple-lumen non tunneled hemodialysis catheter with tip ultimately terminating within the superior aspect of the right atrium. Final catheter positioning was confirmed and documented with a spot radiographic image. The catheter aspirates and flushes normally. The catheter was flushed with appropriate volume heparin dwells. The catheter exit site was secured with a 0-silk retention suture. A dressing was placed. The patient tolerated the procedure well without immediate post procedural complication. IMPRESSION: Successful placement of a right internal jugular approach 16 cm temporary dialysis catheter with tip terminating with in the superior aspect of the right atrium. The catheter is ready for immediate use. PLAN: This catheter may be converted to a tunneled dialysis catheter at a later date as indicated. Ruthann Cancer, MD [facility] Electronically Signed   By: Ruthann Cancer MD   On: 07/27/2020 14:06   DG CHEST PORT 1 VIEW  Result Date: 2020-08-04 CLINICAL DATA:  Dyspnea EXAM: PORTABLE CHEST 1 VIEW COMPARISON:  07/16/2020 FINDINGS: Right internal jugular central venous catheter has been placed with its tip overlying the superior vena cava. Lungs are clear. No pneumothorax or pleural effusion. Cardiac size within normal limits. Pulmonary vascularity is normal. No acute bone abnormality. IMPRESSION: Right internal jugular central venous catheter tip overlying the superior vena cava. No pneumothorax. Electronically Signed   By: Fidela Salisbury MD   On: 08-04-20 00:05     Medications:    .  sodium bicarbonate (isotonic) infusion in sterile water 50 mL/hr at 08-04-2020 0327   . Chlorhexidine Gluconate Cloth  6 each Topical Q0600  . umeclidinium bromide  1 puff Inhalation Daily   And  . fluticasone furoate-vilanterol  1 puff Inhalation Daily  . gabapentin  300 mg Oral QHS  . guaiFENesin  600 mg Oral BID  .  influenza vac split quadrivalent PF  0.5 mL Intramuscular Tomorrow-1000  . metoprolol tartrate  12.5 mg Oral BID  . rosuvastatin  10 mg Oral Daily  . sodium bicarbonate  650 mg Oral BID  . sodium zirconium cyclosilicate  10 g Oral Daily  . thiamine injection  100 mg Intravenous Daily   acetaminophen **OR** acetaminophen, albuterol, lidocaine-EPINEPHrine, methocarbamol, morphine injection, ondansetron **OR** ondansetron (ZOFRAN) IV, oxyCODONE  Assessment/ Plan:  1. AKI on CKD:  Baseline Cr 1.5-2 but on presentation 3.6 and trended up to 5.6 over past 36h.    Continues to be hypotensive.  No evidence of AIN or glomerulonephritis on urinalysis.  Does have some proteinuria on dipstick imaging does not show any evidence of obstruction.  Blood pressure continues to be low even though he did tolerate some dialysis 07/27/2020.  It may be beneficial to place patient on CRRT.  Have discussed with primary service and appreciate assistance from Dr. Ree Kida  2. Hyperkalemia:   Still hyperkalemic will place patient on CRRT  3. Resp Acidosis + AGMA:  Presumably secondary to COPD + AKI but will r/u ingestions for completeness.  Supplemental PO bicarb.    Will discontinue sodium bicarbonate at this time  4. DM complicated by neuropathy: Says diet controlled at home;  insulin per primary, dec gabapentin from 300 BID to qhs given renal function.    LOS: [place] _0 _1 :25 AM

## 2020-08-12 NOTE — Progress Notes (Signed)
PROGRESS NOTE    Bryan [name]  QPY:[id number] DOB: November 24, 1955 DOA: 08/03/2020 PCP: Wynonia Hazard, NP   Brief Narrative:  64 year old male with a history of chronic abdominal pain followed by gastroenterology, diabetes mellitus, NSTEMI March 2021, COPD with chronic hypoxic respiratory failure presented with complaints of shortness of breath and abdominal pain.  Patient found to have acute kidney injury.  Nephrology and palliative care also consulted.  Nephrology wanted to start hemodialysis.  Attempted hemodialysis on 07/27/2020 however patient continued to have hypotension.  Will transfer to ICU for initiation of CRRT.  Assessment & Plan   Acute kidney injury on chronic kidney disease, stage IIIa with anion gap metabolic acidosis -Unclear etiology -Patient with hypotensive on admission -Torsemide as well as Dexilant held -Creatinine peaked to 7.58  (baseline creatinine approximately 1.5-2 although he presented with an creatinine of 3.6). Currently creatinine 6.09 -Nephrology consulted and appreciated,-UA unremarkable -Discussed with Dr. Justin Mend, IJ catheter placed for initiation of dialysis; started HD on 07/27/2020 however patient did have hypotension.  Discussed with Dr. Justin Mend, will transfer patient to ICU for initiation of CRRT -Continue bicarb  Hyperkalemia -Secondary to acute kidney injury -Mildly improved, currently 5.8 -Patient had received Lokelma that was placed on Kayexalate as he was unable to participate in oral intake of Lokelma -Suspect will continue to improve with dialysis/CRRT -Continue to monitor BMP  Hypomagnesemia -Replace, will monitor and replace as needed  Acute hypoxic respiratory failure -Overnight, patient required 4 L of nasal cannula -VBG was obtained shows pH of 7.117, PCO2 65.8, PO2 less than 31.0, bicarb 16 -Currently on BiPAP.  ABG this morning appears to be mildly improved with pH 7.279, PCO2 51.1, PO2 75.8, bicarb 12.4  Acute metabolic  encephalopathy -Likely multifactorial including renal failure versus hypoxia/hypercapnia -Continue to monitor  Chronic abdominal pain -Patient with a history of GERD, PUD, severe grade D reflux esophagitis, dysphagia, PVD without motility issues, constipation. Patient follows with gastroenterology and was seen on 08/10/2020 at which time outpatient EGD was planned. He was tried on a trial of Dexilant, Levsin and Linzess.  Bentyl was discontinued.  He was referred to vascular surgery for abnormalities found on MRI.  Abnormalities were not thought to be contributory to pain or represent chronic mesenteric ischemia. -Minimally elevated lipase -Currently stable  COPD/emphysema with chronic hypoxic respiratory failure/pulmonary hypertension -Chest x-ray showed no acute infection -Patient was placed on BiPAP -At baseline, patient is on 2 L of home oxygen  Possible sleep apnea -Pulmonology arranging outpatient sleep study  Diabetes mellitus, type II with neuropathy -Hemoglobin A1c 6.7 on June 2021 -Continue gabapentin however dose decreased due to renal failure -Currently not on insulin sliding scale  Goals of care -Patient currently DNR -Palliative care consulted and appreciated  DVT Prophylaxis Heparin  Code Status: DNR  Family Communication: Son at bedside  Disposition Plan:  Status is: Inpatient  Remains inpatient appropriate because:Hemodynamically unstable, Persistent severe electrolyte disturbances, IV treatments appropriate due to intensity of illness or inability to take PO and Inpatient level of care appropriate due to severity of illness   Dispo: The patient is from: Home              Anticipated d/c is to: TBD              Anticipated d/c date is: > 3 days              Patient currently is not medically stable to d/c.   Consultants Nephrology Palliative care  Procedures  Renal US  Antibiotics   Anti-infectives (From admission, onward)   None       Subjective:   Bryan [name] seen and examined today.  Currently not interactive. Bipap in place.  Son at bedside, and states that patient was not interactive with him yesterday or this morning.  Objective:   Vitals:   08-02-20 1000 08/02/20 1015 2020/08/02 1030 2020-08-02 1131  BP: (!) _0   Pulse: 70 70 70   Resp: (!) 21 (!) 33 15   Temp:    97.8 F (36.6 C)  TempSrc:    Axillary  SpO2: (!) 89% (!) 88% 90%   Weight:      Height:        Intake/Output Summary (Last 24 hours) at 08-02-20 1154 Last data filed at 08/02/20 0400 Gross per 24 hour  Intake 1282.5 ml  Output 906 ml  Net 376.5 ml   Filed Weights   07/27/20 1540 07/27/20 1840 08/02/20 0923  Weight: 61.7 kg (P) 61 kg 59.8 kg   Exam  General: Well developed, chronically ill-appearing, NAD  HEENT: NCAT, BiPAP in place  Cardiovascular: S1 S2 auscultated, RRR  Respiratory: Diminished breath sounds  Abdomen: Soft, nontender, distended, + bowel sounds  Extremities: warm dry without cyanosis clubbing or edema  Neuro: AAO x1, cannot fully assess  Data Reviewed: I have personally reviewed following labs and imaging studies  CBC: Recent Labs  Lab 07/16/2020 2344 07/25/20 0412 07/27/20 0745 02-Aug-2020 0218  WBC 6.5 6.8 10.6* 13.1*  NEUTROABS 4.1 5.4  --   --   HGB 12.2* 11.5* 9.9* 10.2*  HCT 41.8 41.0 33.5* 34.6*  MCV 77.7* 79.3* 77.5* 77.1*  PLT 348 358 242 858*   Basic Metabolic Panel: Recent Labs  Lab 07/25/20 0412 07/25/20 0412 07/26/20 0419 07/26/20 0419 07/26/20 1209 07/26/20 2341 07/27/20 0745 07/27/20 1237 08/02/20 0218  NA 132*   < > 134*   < > 134* 132* 131* 133* 134*  K 6.2*   < > 6.3*   < > 6.4* 6.8* 6.2* 5.8* 5.8*  CL 97*   < > 96*   < > 96* 97* 93* 96* 95*  CO2 21*   < > 21*   < > 17* 18* _1 GLUCOSE 84   < > 147*   < > 138* 143* 245* 176* 109*  BUN 69*   < > 93*   < > 96* 104* 108* 108* 72*  CREATININE 3.56*   < > 5.61*   < > 6.03* 7.27* 7.29* 7.58* 6.09*   CALCIUM 8.0*   < > 7.3*   < > 7.7* 6.8* 6.5* 6.7* 7.0*  MG 1.4*  --  1.9  --   --   --   --   --   --   PHOS  --   --  9.1*  --   --   --  9.0* 8.6* 6.5*   < > = values in this interval not displayed.   GFR: Estimated Creatinine Clearance: 9.9 mL/min (A) (by C-G formula based on SCr of 6.09 mg/dL (H)). Liver Function Tests: Recent Labs  Lab 07/29/2020 2344 07/25/20 0412 07/27/20 0745 07/27/20 1237 08/02/2020 0218  AST 33 41  --   --   --   ALT 29 34  --   --   --   ALKPHOS 90 86  --   --   --   BILITOT 0.5 0.6  --   --   --  PROT 7.6 7.4  --   --   --   ALBUMIN 4.0 3.9 3.5 3.3* 3.3*   Recent Labs  Lab 07/31/2020 2344  LIPASE 61*   No results for input(s): AMMONIA in the last 168 hours. Coagulation Profile: No results for input(s): INR, PROTIME in the last 168 hours. Cardiac Enzymes: No results for input(s): CKTOTAL, CKMB, CKMBINDEX, TROPONINI in the last 168 hours. BNP (last 3 results) No results for input(s): PROBNP in the last 8760 hours. HbA1C: Recent Labs    08-04-2020 0218  HGBA1C 6.8*   CBG: Recent Labs  Lab 07/27/20 2025 07/27/20 2321 08/04/20 0555 08/04/2020 1101 Aug 04, 2020 1135  GLUCAP 131* 114* 120* 127* 118*   Lipid Profile: No results for input(s): CHOL, HDL, LDLCALC, TRIG, CHOLHDL, LDLDIRECT in the last 72 hours. Thyroid Function Tests: No results for input(s): TSH, T4TOTAL, FREET4, T3FREE, THYROIDAB in the last 72 hours. Anemia Panel: No results for input(s): VITAMINB12, FOLATE, FERRITIN, TIBC, IRON, RETICCTPCT in the last 72 hours. Urine analysis:    Component Value Date/Time   COLORURINE YELLOW 07/25/2020 0356   APPEARANCEUR HAZY (A) 07/25/2020 0356   LABSPEC 1.012 07/25/2020 0356   PHURINE 5.0 07/25/2020 0356   GLUCOSEU NEGATIVE 07/25/2020 0356   HGBUR NEGATIVE 07/25/2020 0356   BILIRUBINUR NEGATIVE 07/25/2020 0356   KETONESUR NEGATIVE 07/25/2020 0356   PROTEINUR 30 (A) 07/25/2020 0356   UROBILINOGEN 0.2 07/08/2015 1314   NITRITE NEGATIVE  07/25/2020 0356   LEUKOCYTESUR NEGATIVE 07/25/2020 0356   Sepsis Labs: _0 (procalcitonin:4,lacticidven:4)  ) Recent Results (from the past 240 hour(s))  Respiratory Panel by RT PCR (Flu A&B, Covid) - Nasopharyngeal Swab     Status: None   Collection Time: 07/16/2020 10:22 PM   Specimen: Nasopharyngeal Swab  Result Value Ref Range Status   SARS Coronavirus 2 by RT PCR NEGATIVE NEGATIVE Final    Comment: (NOTE) SARS-CoV-2 target nucleic acids are NOT DETECTED.  The SARS-CoV-2 RNA is generally detectable in upper respiratoy specimens during the acute phase of infection. The lowest concentration of SARS-CoV-2 viral copies this assay can detect is 131 copies/mL. A negative result does not preclude SARS-Cov-2 infection and should not be used as the sole basis for treatment or other patient management decisions. A negative result may occur with  improper specimen collection/handling, submission of specimen other than nasopharyngeal swab, presence of viral mutation(s) within the areas targeted by this assay, and inadequate number of viral copies (<131 copies/mL). A negative result must be combined with clinical observations, patient history, and epidemiological information. The expected result is Negative.  Fact Sheet for Patients:  PinkCheek.be  Fact Sheet for Healthcare Providers:  GravelBags.it  This test is no t yet approved or cleared by the [place] FDA and  has been authorized for detection and/or diagnosis of SARS-CoV-2 by FDA under an Emergency Use Authorization (EUA). This EUA will remain  in effect (meaning this test can be used) for the duration of the COVID-19 declaration under Section 564(b)(1) of the Act, 21 U.S.C. section 360bbb-3(b)(1), unless the authorization is terminated or revoked sooner.     Influenza A by PCR NEGATIVE NEGATIVE Final   Influenza B by PCR NEGATIVE NEGATIVE Final    Comment:  (NOTE) The Xpert Xpress SARS-CoV-2/FLU/RSV assay is intended as an aid in  the diagnosis of influenza from Nasopharyngeal swab specimens and  should not be used as a sole basis for treatment. Nasal washings and  aspirates are unacceptable for Xpert Xpress SARS-CoV-2/FLU/RSV  testing.  Fact Sheet for  Patients: PinkCheek.be  Fact Sheet for Healthcare Providers: GravelBags.it  This test is not yet approved or cleared by the [place] FDA and  has been authorized for detection and/or diagnosis of SARS-CoV-2 by  FDA under an Emergency Use Authorization (EUA). This EUA will remain  in effect (meaning this test can be used) for the duration of the  Covid-19 declaration under Section 564(b)(1) of the Act, 21  U.S.C. section 360bbb-3(b)(1), unless the authorization is  terminated or revoked. Performed at [facility], [address], [place], [place] [address]       Radiology Studies: IR Fluoro Guide CV Line Right  Result Date: 07/27/2020 INDICATION: 64 year old male with history of acute kidney injury requiring hemodialysis. EXAM: NON-TUNNELED CENTRAL VENOUS HEMODIALYSIS CATHETER PLACEMENT WITH ULTRASOUND AND FLUOROSCOPIC GUIDANCE COMPARISON:  None. MEDICATIONS: None FLUOROSCOPY TIME:  0 minutes, 12 seconds (1 mGy) COMPLICATIONS: None immediate. PROCEDURE: Informed written consent was obtained from the patient after a discussion of the risks, benefits, and alternatives to treatment. Questions regarding the procedure were encouraged and answered. The right neck and chest were prepped with chlorhexidine in a sterile fashion, and a sterile drape was applied covering the operative field. Maximum barrier sterile technique with sterile gowns and gloves were used for the procedure. A timeout was performed prior to the initiation of the procedure. After the overlying soft tissues were anesthetized, a small venotomy incision was created and a  micropuncture kit was utilized to access the internal jugular vein. Real-time ultrasound guidance was utilized for vascular access including the acquisition of a permanent ultrasound image documenting patency of the accessed vessel. The microwire was utilized to measure appropriate catheter length. A stiff glidewire was advanced to the level of the IVC. Under fluoroscopic guidance, the venotomy was serially dilated, ultimately allowing placement of a 16 cm temporary triple-lumen non tunneled hemodialysis catheter with tip ultimately terminating within the superior aspect of the right atrium. Final catheter positioning was confirmed and documented with a spot radiographic image. The catheter aspirates and flushes normally. The catheter was flushed with appropriate volume heparin dwells. The catheter exit site was secured with a 0-silk retention suture. A dressing was placed. The patient tolerated the procedure well without immediate post procedural complication. IMPRESSION: Successful placement of a right internal jugular approach 16 cm temporary dialysis catheter with tip terminating with in the superior aspect of the right atrium. The catheter is ready for immediate use. PLAN: This catheter may be converted to a tunneled dialysis catheter at a later date as indicated. Ruthann Cancer, MD [facility] Electronically Signed   By: Ruthann Cancer MD   On: 07/27/2020 14:06   DG CHEST PORT 1 VIEW  Result Date: 2020/08/11 CLINICAL DATA:  Dyspnea EXAM: PORTABLE CHEST 1 VIEW COMPARISON:  07/29/2020 FINDINGS: Right internal jugular central venous catheter has been placed with its tip overlying the superior vena cava. Lungs are clear. No pneumothorax or pleural effusion. Cardiac size within normal limits. Pulmonary vascularity is normal. No acute bone abnormality. IMPRESSION: Right internal jugular central venous catheter tip overlying the superior vena cava. No  pneumothorax. Electronically Signed   By: Fidela Salisbury MD   On: Aug 11, 2020 00:05     Scheduled Meds: . atropine  1 mg Intravenous Once  . Chlorhexidine Gluconate Cloth  6 each Topical Q0600  . umeclidinium bromide  1 puff Inhalation Daily   And  . fluticasone furoate-vilanterol  1 puff Inhalation Daily  . gabapentin  300 mg Oral QHS  .  guaiFENesin  600 mg Oral BID  . influenza vac split quadrivalent PF  0.5 mL Intramuscular Tomorrow-1000  . insulin aspart  0-6 Units Subcutaneous Q4H  . LORazepam      . rosuvastatin  10 mg Oral Daily  . thiamine injection  100 mg Intravenous Daily   Continuous Infusions: .  prismasol BGK 4/2.5 500 mL/hr at 08-22-20 1053  . sodium chloride    . furosemide    . furosemide    . norepinephrine (LEVOPHED) Adult infusion 2 mcg/min (22-Aug-2020 1126)  . prismasol BGK 2/2.5 dialysis solution 2,000 mL/hr at 2020-08-22 1052  . prismasol BGK 2/2.5 replacement solution 500 mL/hr at Aug 22, 2020 1052     LOS: 3 days   Time Spent in minutes   45 minutes  Tammi Boulier D.O. on Aug 22, 2020 at 11:54 AM  Between 7am to 7pm - Please see pager noted on [email or web address]  After 7pm go to [email or web address]  And look for the night coverage person covering for me after hours  [facility] Office  [phone]

## 2020-08-12 NOTE — Consult Note (Signed)
Consultation Note Date: 08-23-2020   Patient Name: Bryan [name]  DOB: [birth year]  MRN: [id number]  Age / Sex: 64 y.o., male  PCP: Wynonia Hazard, NP Referring Physician: Cristal Ford, DO  Reason for Consultation: Establishing goals of care  HPI/Patient Profile: 64 y.o. male  with past medical history of chronic respiratory failure on home oxygen, COPD, tubular adenoma, DM, HTN, CVA, CAD, NSTEMI admitted on 07/20/2020 with abdominal pain. Hospital admission for acute kidney failure and hyperkalemia. Nephrology consulted and dialysis was initiated 07/27/20 with 900 cc out. On 11/16, remained hyperkalemic and borderline hypotension. Requiring BiPAP this admission. Transferred to ICU for initiation of CRRT. Palliative medicine consultation for goals of care.    Clinical Assessment and Goals of Care:  I have reviewed medical records, discussed with care team, and met with patient's son [name]) at bedside to discuss goals of care.   Patient recently transferred to ICU. He remains on BiPAP. He will open eyes to voice but unable to participate in discussion at this time. RN at bedside starting CRRT.   I introduced Palliative Medicine as specialized medical care for people living with serious illness. It focuses on providing relief from the symptoms and stress of a serious illness. The goal is to improve quality of life for both the patient and the family.  Son reports patient is not married. Two children, [name] and his sister who is aware of her father's condition. Prior to admission, patient living with son. Chronically on oxygen, son reports 1-2 L due to COPD. On disability but fairly independent at home. Son declines reported history of CKD.   Discussed events leading up to admission and course of hospitalization including diagnoses, interventions, plan of care. [name] shares that his father made decision  yesterday to pursue hemodialysis.   The patient does not have a documented living will or POA. Explained to son that him and his sister are default POA's in the event their father cannot make decisions.  Explained to [name] his father's wishes for DNR when asked on admit. [name] is surprised to hear this. Reviewed notes from previous providers and educated on full scope treatment, which is being done.   We discussed watchful waiting, time for outcomes. Reassured of ongoing support from PMT through admission. PMT contact information given. Answered questions and concerns.    SUMMARY OF RECOMMENDATIONS    DNR in event of cardiac arrest per patient wishes on admit. Otherwise, FULL scope treatment.  Transferred to ICU this AM. RN at bedside initiating CRRT.  Patient does not have a documented living will or POA. No spouse. His two children (daughter and son) are default HCPOA's.   Watchful waiting. Time for outcomes.   PMT provider contact information given to son. Will continue to follow this admission.   Code Status/Advance Care Planning:  DNR: per patient wishes on admit. See H&P.  Symptom Management:   Per attending  Palliative Prophylaxis:   Aspiration, Delirium Protocol, Frequent Pain Assessment, Oral Care and Turn Reposition  Psycho-social/Spiritual:  Desire for further Chaplaincy support: yes  Additional Recommendations: Caregiving  Support/Resources and Compassionate Wean Education  Prognosis:   Unable to determine: guarded  Discharge Planning: To Be Determined      Primary Diagnoses: Present on Admission: . AKI (acute kidney injury) ([place]) . Chronic abdominal pain . Chronic respiratory failure with hypoxia ([place]) . CKD (chronic kidney disease) stage 3, GFR 30-59 ml/min ([facility]) . COPD (chronic obstructive pulmonary disease) ([place]) . Hypomagnesemia . Type 2 diabetes mellitus with hyperlipidemia ([place])   I have reviewed the medical record, interviewed the  patient and family, and examined the patient. The following aspects are pertinent.  Past Medical History:  Diagnosis Date  . Abdominal pain   . Acid reflux   . Arthritis   . Asthma   . Chest pain   . Chronic pain    with leg and back pain (disc problem)  . COPD (chronic obstructive pulmonary disease) ([place])   . Diabetes mellitus without complication ([place])   . Diabetic peripheral neuropathy ([place]) 07/25/2020  . Epigastric pain   . Headache    HX OF  . History of upper GI x-ray series    to follow showed large duodenal ulcer H pylori serologies were negative  . Hypertension   . NSTEMI (non-ST elevated myocardial infarction) ([place]) 11/2019  . Pneumonia    07/17/17  . Pre-diabetes   . Stroke [facility])    TIA MINI STROKE  . Tubular adenoma    Social History   Socioeconomic History  . Marital status: Single    Spouse name: Not on file  . Number of children: 2  . Years of education: 23  . Highest education level: High school graduate  Occupational History  . Occupation: disabled    Fish farm manager: UNEMPLOYED  Tobacco Use  . Smoking status: Former Smoker    Packs/day: 0.50    Years: 40.00    Pack years: 20.00    Types: Cigarettes    Quit date: 11/11/2014    Years since quitting: 5.7  . Smokeless tobacco: Never Used  Vaping Use  . Vaping Use: Never used  Substance and Sexual Activity  . Alcohol use: No    Alcohol/week: 0.0 standard drinks  . Drug use: Not Currently  . Sexual activity: Not Currently    Birth control/protection: None  Other Topics Concern  . Not on file  Social History Narrative   Lives with girlfriend and son, is on disability for history of back injuries.   Social Determinants of Health   Financial Resource Strain: Medium Risk  . Difficulty of Paying Living Expenses: Somewhat hard  Food Insecurity: No Food Insecurity  . Worried About Charity fundraiser in the Last Year: Never true  . Ran Out of Food in the Last Year: Never true  Transportation Needs: No  Transportation Needs  . Lack of Transportation (Medical): No  . Lack of Transportation (Non-Medical): No  Physical Activity: Inactive  . Days of Exercise per Week: 0 days  . Minutes of Exercise per Session: 0 min  Stress: No Stress Concern Present  . Feeling of Stress : Not at all  Social Connections: Socially Isolated  . Frequency of Communication with Friends and Family: More than three times a week  . Frequency of Social Gatherings with Friends and Family: More than three times a week  . Attends Religious Services: Never  . Active Member of Clubs or Organizations: No  . Attends Archivist Meetings: Never  . Marital Status: Never  married   Family History  Problem Relation Age of Onset  . Cancer Father   . Asthma Mother   . Colon cancer Neg Hx    Scheduled Meds: . Chlorhexidine Gluconate Cloth  6 each Topical Q0600  . umeclidinium bromide  1 puff Inhalation Daily   And  . fluticasone furoate-vilanterol  1 puff Inhalation Daily  . gabapentin  300 mg Oral QHS  . guaiFENesin  600 mg Oral BID  . influenza vac split quadrivalent PF  0.5 mL Intramuscular Tomorrow-1000  . insulin aspart  0-6 Units Subcutaneous Q4H  . rosuvastatin  10 mg Oral Daily  . thiamine injection  100 mg Intravenous Daily   Continuous Infusions: .  prismasol BGK 4/2.5 500 mL/hr at 08-22-20 1053  . norepinephrine (LEVOPHED) Adult infusion    . prismasol BGK 2/2.5 dialysis solution 2,000 mL/hr at August 22, 2020 1052  . prismasol BGK 2/2.5 replacement solution 500 mL/hr at 08-22-2020 1052   PRN Meds:.acetaminophen **OR** acetaminophen, albuterol, heparin, lidocaine-EPINEPHrine, methocarbamol, morphine injection, ondansetron **OR** ondansetron (ZOFRAN) IV, oxyCODONE, sodium chloride Medications Prior to Admission:  Prior to Admission medications   Medication Sig Start Date End Date Taking? Authorizing Provider  acetaminophen (TYLENOL) 325 MG tablet Take 2 tablets (650 mg total) by mouth every 6 (six) hours  as needed for mild pain, fever or headache (or Fever >/= 101). 04/22/20  Yes Emokpae, Courage, MD  albuterol (PROVENTIL) (2.5 MG/3ML) 0.083% nebulizer solution Take 3 mLs (2.5 mg total) by nebulization every 4 (four) hours as needed for wheezing or shortness of breath. 04/22/20  Yes Emokpae, Courage, MD  albuterol (VENTOLIN HFA) 108 (90 Base) MCG/ACT inhaler Inhale 2 puffs into the lungs every 4 (four) hours as needed for wheezing or shortness of breath. Do not use with nebulizer 04/22/20  Yes Emokpae, Courage, MD  calcium carbonate (TUMS - DOSED IN MG ELEMENTAL CALCIUM) 500 MG chewable tablet Chew 2 tablets (400 mg of elemental calcium total) by mouth 3 (three) times daily as needed for indigestion or heartburn. 12/04/19  Yes Amin, Jeanella Flattery, MD  dexlansoprazole (DEXILANT) 60 MG capsule Take 1 capsule (60 mg total) by mouth daily. 07/27/2020  Yes Erenest Rasher, PA-C  Fluticasone-Umeclidin-Vilant (TRELEGY ELLIPTA) 100-62.5-25 MCG/INH AEPB Inhale 1 puff into the lungs daily. 06/14/19  Yes Emokpae, Courage, MD  gabapentin (NEURONTIN) 300 MG capsule Take 1 capsule (300 mg total) by mouth 2 (two) times daily. Pt. Says he is taking twice daily. 06/14/19  Yes Emokpae, Courage, MD  guaiFENesin (MUCINEX) 600 MG 12 hr tablet Take 1 tablet (600 mg total) by mouth 2 (two) times daily. 04/22/20 04/22/21 Yes Emokpae, Courage, MD  hyoscyamine (LEVSIN) 0.125 MG tablet Take 1 tablet (0.125 mg total) by mouth every 4 (four) hours as needed. 07/14/2020  Yes Erenest Rasher, PA-C  linaclotide (LINZESS) 72 MCG capsule Take 1 capsule (72 mcg total) by mouth daily before breakfast. Patient taking differently: Take 72 mcg by mouth daily before breakfast. As needed 06/14/19  Yes Emokpae, Courage, MD  magnesium oxide (MAG-OX) 400 MG tablet Take 400 mg by mouth daily.   Yes [provider]  methocarbamol (ROBAXIN) 500 MG tablet Take 1 tablet (500 mg total) by mouth every 8 (eight) hours as needed for muscle spasms. 04/01/20   Yes Maudie Flakes, MD  metoprolol tartrate (LOPRESSOR) 25 MG tablet Take 0.5 tablets (12.5 mg total) by mouth 2 (two) times daily. 12/04/19 03/20/21 Yes Amin, Ankit Chirag, MD  nitroGLYCERIN (NITROSTAT) 0.4 MG SL tablet Place 1  tablet (0.4 mg total) under the tongue every 5 (five) minutes as needed for chest pain. 05/19/20  Yes Rolland Porter, MD  OXYGEN Inhale 2.5 L into the lungs daily.   Yes [provider]  rosuvastatin (CRESTOR) 10 MG tablet Take 1 tablet (10 mg total) by mouth daily. 07/06/20  Yes Freada Bergeron, MD  torsemide (DEMADEX) 20 MG tablet Take 1 tablet (20 mg total) by mouth 2 (two) times daily. For Fluid/Heart 04/22/20  Yes Roxan Hockey, MD   No Known Allergies Review of Systems  Unable to perform ROS: Acuity of condition   Physical Exam Vitals and nursing note reviewed.  Constitutional:      Appearance: He is ill-appearing.  Cardiovascular:     Rate and Rhythm: Normal rate.  Pulmonary:     Effort: No tachypnea, accessory muscle usage or respiratory distress.     Comments: BiPAP Neurological:     Mental Status: He is easily aroused.     Comments: Drowsy, on BiPAP. Will open eyes to voice.    Vital Signs: BP 90/62   Pulse 70   Temp 99.2 F (37.3 C) (Axillary)   Resp 15   Ht _0  (1.6 m)   Wt 59.8 kg   SpO2 90%   BMI 23.35 kg/m  Pain Scale: 0-10   Pain Score: 0-No pain   SpO2: SpO2: 90 % O2 Device:SpO2: 90 % O2 Flow Rate: .O2 Flow Rate (L/min): 4 L/min  IO: Intake/output summary:   Intake/Output Summary (Last 24 hours) at 08-22-20 1054 Last data filed at 08-22-20 0400 Gross per 24 hour  Intake 1282.5 ml  Output 906 ml  Net 376.5 ml    LBM: Last BM Date: 07/25/20 Baseline Weight: Weight: 54 kg Most recent weight: Weight: 59.8 kg     Palliative Assessment/Data: PPS 30%     Time Total: 30mn Greater than 50%  of this time was spent counseling and coordinating care related to the above assessment and plan.  Signed  by:  MIhor Dow DNP, FNP-C Palliative Medicine Team  Phone: [phone]: [phone]  Please contact Palliative Medicine Team phone at [phone] questions and concerns.  For individual provider: See AShea Evans

## 2020-08-12 NOTE — Progress Notes (Addendum)
Called emergently to the bedside, patient unresponsive, bradycardic hypotensive.  New change in status. Bedside echo reviewed with my partner Dr. Tacy Learn , enlarged RV with D-shaped septum and very small LV , reviewed prior echo from 06/2020 showing RVSP 100 Discussed with son at the bedside who reversed CODE STATUS "he was not in his right state of mind when he made that decision" With sudden change in status and findings consistent with obstructive shock , TNK administered IV, risks and benefits explained to son at the bedside. He was emergently intubated, vent settings provided. Pressors rapidly titrated to max doses of Levophed and Neo-Synephrine. He remained hypotensive, epinephrine 1 amp given IV. Blood pressure improved, developed SVT 160s.  Repeat echo continued to show dilated RV, no improvement Treated for hyperkalemia with 1 amp of calcium and 1 amp of bicarb  Further discussion with son -no CPR no cardioversion orders issued We will continue current life support measures  Additional critical care time x 66m Jakara Blatter V. AElsworth SohoMD

## 2020-08-12 NOTE — Telephone Encounter (Signed)
Submitted a PA via phone by Lauralyn Primes [phone] for Hyoscyamine 0.125 mg. Waiting on an approval or denial.

## 2020-08-12 NOTE — Progress Notes (Signed)
Pt asystole on Heart  monitor. Pt heart and lungs auscultated, pt DNR family at bedside. Pt pronounced at 1330 Dr. Elsworth Soho notified

## 2020-08-12 NOTE — Procedures (Signed)
Intubation Procedure Note  THADD APUZZO  [id number]  [birth year]  Date:08/04/2020  Time:12:08 PM   Provider Performing:Gamal Todisco    Procedure: Intubation (31500)  Indication(s) Respiratory Failure  Consent Risks of the procedure as well as the alternatives and risks of each were explained to the patient and/or caregiver.  Consent for the procedure was obtained and is signed in the bedside chart   Anesthesia None   Time Out Verified patient identification, verified procedure, site/side was marked, verified correct patient position, special equipment/implants available, medications/allergies/relevant history reviewed, required imaging and test results available.   Sterile Technique Usual hand hygeine, masks, and gloves were used   Procedure Description Patient positioned in bed supine.  Sedation given as noted above.  Patient was intubated with endotracheal tube using Glidescope.  View was Grade 1 full glottis .  Number of attempts was 1.  Colorimetric CO2 detector was consistent with tracheal placement.   Complications/Tolerance None; patient tolerated the procedure well. Chest X-ray is ordered to verify placement.   EBL Minimal   Specimen(s) None

## 2020-08-12 NOTE — Consult Note (Signed)
NAME:  Bryan [name], MRN:  [id number], DOB:  1956/02/04, LOS: 3 ADMISSION DATE:  07/23/2020, CONSULTATION DATE:  08/23/2020 REFERRING MD:  Ree Kida, CHIEF COMPLAINT:  Renal failure   Brief History   64 y.o. M with PMH of COPD on 5L home O2, tubular adenoma, DM, HTN, CVA, CAD and NSTEMI who presented 11/13 with abdominal pain and was admitted with worsening renal function.   A temporary R IJ catheter was placed and he was started on HD.  He had worsening hyperkalemia and low blood pressures, so CRRT to be initiated, PCCM consulted for ICU transfer.  History of present illness   Bryan [name] is a 64 y.o. M with PMH COPD on 5L home O2, tubular adenoma, DM, HTN, CVA, CAD and NSTEMI who was admitted 11/13 with acute kidney failure and hyperkalemia.  He was admitted and initially treated medically for hyperkalemia without improvement.  Nephrology was consulted and R IJ trialysis catheter placed by IR.  He was dialyzed 11/15 with 900cc out, 11/16 remained hyperkalemic on Bipap with borderline hypotension.  Nephrology plan to proceed with CRRT, so PCCM consulted for ICU transfer.   Pt is tired, but arousable and has no complaints.  Son is at the bedside and confirms that patient is DNR, but they would like to proceed with CRRT.   Past Medical History   has a past medical history of Abdominal pain, Acid reflux, Arthritis, Asthma, Chest pain, Chronic pain, COPD (chronic obstructive pulmonary disease) ([place]), Diabetes mellitus without complication ([place]), Diabetic peripheral neuropathy ([place]) (07/25/2020), Epigastric pain, Headache, History of upper GI x-ray series, Hypertension, NSTEMI (non-ST elevated myocardial infarction) ([place]) (11/2019), Pneumonia, Pre-diabetes, Stroke ([place]), and Tubular adenoma.   Significant Hospital Events   11/13 Admit to hospitalists 11/15 dialysis 11/16 ICU txfr and CRRT  Consults:  Nephrology PCCM  Procedures:  11/15 R IJ Trialysis  catheter  Significant Diagnostic Tests:  11/13  Renal US>>No evidence of hydronephrosis. Mildly echogenic renal parenchyma suggests underlying medical renal disease.  Micro Data:  11/12 Covid-19 and influenza>>negative  Antimicrobials:     Interim history/subjective:  As above  Objective   Blood pressure 95/65, pulse 76, temperature 98.8 F (37.1 C), temperature source Axillary, resp. rate (!) 27, height _0  (1.6 m), weight (P) 61 kg, SpO2 98 %.    FiO2 (%):  [40 %] 40 %   Intake/Output Summary (Last 24 hours) at August 23, 2020 0750 Last data filed at 2020-08-23 0400 Gross per 24 hour  Intake 1282.5 ml  Output 906 ml  Net 376.5 ml   Filed Weights   07/27/20 0600 07/27/20 1540 07/27/20 1840  Weight: 60.4 kg 61.7 kg (P) 61 kg    General:  Elderly, chronically-ill appearing M awake and resting on Bipap HEENT: MM pink/moist Neuro: awake, answers questions with one word answers, oriented to person and place, unable to relay date, following commands CV: s1s2 rrr, no m/r/g PULM:  On Bipap, good air movement bilaterally without wheezing or rhonch GI: mildly distended, soft, bsx4 active  Extremities: warm/dry, no edema  Skin: no rashes or lesions  Resolved Hospital Problem list     Assessment & Plan:   Acute on chronic renal failure with hyperkalemia  HD initiated 11/15, low blood pressure necessitating CRRT.  Minimal UOP and no evidence of obstruction on renal US. No evidence of AIN or glomerulonephritis on UA.  P: -transfer to ICU, CRRT per nephrology -Levophed prn to maintain MAP >65 -monitor electrolytes closely  -follow UOP -AGMA resolved, now off  bicarb  COPD   No evidence of acute exacerbation currently, on 5L home O2.  He was transitioned from [place] to bipap on 11/15 overnight as he had dyspnea and acidosis on VBG.  P: -Pt is currently comfortable on Bipap, pH 7.27 pCO2 51 this AM, with CRRT may be able to transition back to [place] -Afebrile without infiltrate on CXR, no antibiotics indicated, strep pneumo antigen  ordered on admission -continue Incruse Ellipta withprn Duonebs   Type 2 DM Diet controlled at home P: -SSI and check Hgb A1c   HTN, CAD Had some transient CP this admission without evidence of ischemia on EKG and negative troponin P: -hold metoprolol and torsemide while blood pressures soft in the setting of the above -Continue telemetry  -Continue Crestor, does not appear to be on Asa at home   Peripheral Neuropathy On Neurontin and Robaxin with prn Oxycodone and Morphine P: -Hold morphine in the setting of hypotension and renal failure, monitor mental status and respiratory drive, sedating medications may need to be further reduced   Best practice:  Diet: Diabetic Pain/Anxiety/Delirium protocol (if indicated): oxycodone VAP protocol (if indicated): n/a DVT prophylaxis: heparin GI prophylaxis: n/a Glucose control: SSI Mobility: bed rest Code Status: DNR Family Communication: son updated at the bedside Disposition: ICU  Labs   CBC: Recent Labs  Lab 08/09/2020 2344 07/25/20 0412 07/27/20 0745 18-Aug-2020 0218  WBC 6.5 6.8 10.6* 13.1*  NEUTROABS 4.1 5.4  --   --   HGB 12.2* 11.5* 9.9* 10.2*  HCT 41.8 41.0 33.5* 34.6*  MCV 77.7* 79.3* 77.5* 77.1*  PLT 348 358 242 141*    Basic Metabolic Panel: Recent Labs  Lab 07/25/20 0412 07/25/20 0412 07/26/20 0419 07/26/20 0419 07/26/20 1209 07/26/20 2341 07/27/20 0745 07/27/20 1237 Aug 18, 2020 0218  NA 132*   < > 134*   < > 134* 132* 131* 133* 134*  K 6.2*   < > 6.3*   < > 6.4* 6.8* 6.2* 5.8* 5.8*  CL 97*   < > 96*   < > 96* 97* 93* 96* 95*  CO2 21*   < > 21*   < > 17* 18* _0 GLUCOSE 84   < > 147*   < > 138* 143* 245* 176* 109*  BUN 69*   < > 93*   < > 96* 104* 108* 108* 72*  CREATININE 3.56*   < > 5.61*   < > 6.03* 7.27* 7.29* 7.58* 6.09*  CALCIUM 8.0*   < > 7.3*   < > 7.7* 6.8* 6.5* 6.7* 7.0*  MG 1.4*  --  1.9  --   --   --   --   --   --   PHOS  --   --  9.1*  --   --   --  9.0* 8.6* 6.5*   < > = values in  this interval not displayed.   GFR: Estimated Creatinine Clearance: 9.9 mL/min (A) (by C-G formula based on SCr of 6.09 mg/dL (H)). Recent Labs  Lab 08/10/2020 2344 07/25/20 0412 07/26/20 1450 07/26/20 2341 07/27/20 0745 08-18-2020 0218  WBC 6.5 6.8  --   --  10.6* 13.1*  LATICACIDVEN  --   --  4.5* 3.7*  --   --     Liver Function Tests: Recent Labs  Lab 08/05/2020 2344 07/25/20 0412 07/27/20 0745 07/27/20 1237 2020/08/18 0218  AST 33 41  --   --   --   ALT 29 34  --   --   --  ALKPHOS 90 86  --   --   --   BILITOT 0.5 0.6  --   --   --   PROT 7.6 7.4  --   --   --   ALBUMIN 4.0 3.9 3.5 3.3* 3.3*   Recent Labs  Lab 07/30/2020 2344  LIPASE 61*   No results for input(s): AMMONIA in the last 168 hours.  ABG    Component Value Date/Time   PHART 7.279 (L) 08/27/2020 0112   PCO2ART 51.1 (H) 27-Aug-2020 0112   PO2ART 75.8 (L) 27-Aug-2020 0112   HCO3 23.1 Aug 27, 2020 0112   TCO2 42 (H) 12/03/2019 1553   ACIDBASEDEF 2.6 (H) 08/27/2020 0112   O2SAT 91.4 08-27-20 0112     Coagulation Profile: No results for input(s): INR, PROTIME in the last 168 hours.  Cardiac Enzymes: No results for input(s): CKTOTAL, CKMB, CKMBINDEX, TROPONINI in the last 168 hours.  HbA1C: Hgb A1c MFr Bld  Date/Time Value Ref Range Status  02/12/2020 11:12 AM 6.7 (H) 4.8 - 5.6 % Final    Comment:    (NOTE) Pre diabetes:          5.7%-6.4% Diabetes:              >6.4% Glycemic control for   <7.0% adults with diabetes   11/24/2019 01:16 PM 6.7 (H) 4.8 - 5.6 % Final    Comment:    (NOTE) Pre diabetes:          5.7%-6.4% Diabetes:              >6.4% Glycemic control for   <7.0% adults with diabetes     CBG: Recent Labs  Lab 07/27/20 1230 07/27/20 1855 07/27/20 2025 07/27/20 2321 08/27/2020 0555  GLUCAP 173* 119* 131* 114* 120*    Review of Systems:   Negative except as noted in HPI  Past Medical History  He,  has a past medical history of Abdominal pain, Acid reflux, Arthritis,  Asthma, Chest pain, Chronic pain, COPD (chronic obstructive pulmonary disease) ([place]), Diabetes mellitus without complication ([place]), Diabetic peripheral neuropathy ([place]) (07/25/2020), Epigastric pain, Headache, History of upper GI x-ray series, Hypertension, NSTEMI (non-ST elevated myocardial infarction) ([place]) (11/2019), Pneumonia, Pre-diabetes, Stroke ([place]), and Tubular adenoma.   Surgical History    Past Surgical History:  Procedure Laterality Date  . BACK SURGERY  7/05; 5/09    Dr.Hirsch,3 lumbar  X3  . BACK SURGERY    . COLONOSCOPY  Feb 2012   Dr. Gala Romney: normal rectum, pedunculate polyp removed but not recovered  . COLONOSCOPY WITH ESOPHAGOGASTRODUODENOSCOPY (EGD) N/A 11/18/2013   Dr.Rourk- tcs= normal rectum, multipe polyps about the ileocecal valve and distal transverse colon o/w the remainder of the colonic mucosa appeared normal bx= tubular adenoma. EGD= normal esophagus, stomach with scattered erosions mottling, friablility, no ulcer or infiltrating process patent pylorus bx= chronic inflammation. next TCS 11/2016  . COLONOSCOPY WITH PROPOFOL N/A 05/25/2017   Dr. Gala Romney: sigmoid diverticulosis, one 4 mm hyerplastic rectal polyp, ascending colonic AVMs surveillance 2023  . COLONOSCOPY WITH PROPOFOL N/A 04/16/2020   Procedure: COLONOSCOPY WITH PROPOFOL;  Surgeon: Daneil Dolin, MD; 5 mm tubular adenoma in the rectum, otherwise normal exam.  . ESOPHAGOGASTRODUODENOSCOPY  1/06   Dr. Volney American esophageal erosions,U-shaped stomach,marked erosions and edema of the bulb without discrete ulcer disease.   . ESOPHAGOGASTRODUODENOSCOPY (EGD) WITH PROPOFOL N/A 05/25/2017   Dr. Gala Romney: reflux esophagitis s/p empiric dilation, normal stomach and duodenum  . ESOPHAGOGASTRODUODENOSCOPY (EGD) WITH PROPOFOL Left 11/30/2019  LA Grade D esophagitis, small hiatal hernia, one non-bleeding cratered gastric ulcer in prepyloric region of stomach, many non-bleeding cratered duodenal ulcers without stigmata of  bleeding, mild luminal narrowing at apex of duodenal bulb but traversed easily. H.pylori serology negative.  . ESOPHAGOGASTRODUODENOSCOPY (EGD) WITH PROPOFOL N/A 04/16/2020   Procedure: ESOPHAGOGASTRODUODENOSCOPY (EGD) WITH PROPOFOL;  Surgeon: Daneil Dolin, MD; normal esophagus, normal stomach, patent pylorus, normal examined duodenum, previously noted PUD and reflux esophagitis healed.   . IR FLUORO GUIDE CV LINE RIGHT  07/27/2020  . MALONEY DILATION N/A 05/25/2017   Procedure: Venia Minks DILATION;  Surgeon: Daneil Dolin, MD;  Location: [facility];  Service: Endoscopy;  Laterality: N/A;  . NECK SURGERY  10/2007   PLATE IN NECK  . POLYPECTOMY  05/25/2017   Procedure: POLYPECTOMY;  Surgeon: Daneil Dolin, MD;  Location: [facility];  Service: Endoscopy;;  rectal  . RIGHT/LEFT HEART CATH AND CORONARY ANGIOGRAPHY N/A 12/03/2019   Procedure: RIGHT/LEFT HEART CATH AND CORONARY ANGIOGRAPHY;  Surgeon: Nigel Mormon, MD;  Location: [facility];  Service: Cardiovascular;  Laterality: N/A;  . TRANSURETHRAL RESECTION OF PROSTATE N/A 08/17/2017   Procedure: TRANSURETHRAL RESECTION OF THE PROSTATE (TURP);  Surgeon: Irine Seal, MD;  Location: [facility];  Service: Urology;  Laterality: N/A;     Social History   reports that he quit smoking about 5 years ago. His smoking use included cigarettes. He has a 20.00 pack-year smoking history. He has never used smokeless tobacco. He reports previous drug use. He reports that he does not drink alcohol.   Family History   His family history includes Asthma in his mother; Cancer in his father. There is no history of Colon cancer.   Allergies No Known Allergies   Home Medications  Prior to Admission medications   Medication Sig Start Date End Date Taking? Authorizing Provider  acetaminophen (TYLENOL) 325 MG tablet Take 2 tablets (650 mg total) by mouth every 6 (six) hours as needed for mild pain, fever or headache (or Fever >/= 101). 04/22/20  Yes  Emokpae, Courage, MD  albuterol (PROVENTIL) (2.5 MG/3ML) 0.083% nebulizer solution Take 3 mLs (2.5 mg total) by nebulization every 4 (four) hours as needed for wheezing or shortness of breath. 04/22/20  Yes Emokpae, Courage, MD  albuterol (VENTOLIN HFA) 108 (90 Base) MCG/ACT inhaler Inhale 2 puffs into the lungs every 4 (four) hours as needed for wheezing or shortness of breath. Do not use with nebulizer 04/22/20  Yes Emokpae, Courage, MD  calcium carbonate (TUMS - DOSED IN MG ELEMENTAL CALCIUM) 500 MG chewable tablet Chew 2 tablets (400 mg of elemental calcium total) by mouth 3 (three) times daily as needed for indigestion or heartburn. 12/04/19  Yes Amin, Jeanella Flattery, MD  dexlansoprazole (DEXILANT) 60 MG capsule Take 1 capsule (60 mg total) by mouth daily. 07/20/2020  Yes Erenest Rasher, PA-C  Fluticasone-Umeclidin-Vilant (TRELEGY ELLIPTA) 100-62.5-25 MCG/INH AEPB Inhale 1 puff into the lungs daily. 06/14/19  Yes Emokpae, Courage, MD  gabapentin (NEURONTIN) 300 MG capsule Take 1 capsule (300 mg total) by mouth 2 (two) times daily. Pt. Says he is taking twice daily. 06/14/19  Yes Emokpae, Courage, MD  guaiFENesin (MUCINEX) 600 MG 12 hr tablet Take 1 tablet (600 mg total) by mouth 2 (two) times daily. 04/22/20 04/22/21 Yes Emokpae, Courage, MD  hyoscyamine (LEVSIN) 0.125 MG tablet Take 1 tablet (0.125 mg total) by mouth every 4 (four) hours as needed. 07/27/2020  Yes Erenest Rasher, PA-C  linaclotide (  LINZESS) 72 MCG capsule Take 1 capsule (72 mcg total) by mouth daily before breakfast. Patient taking differently: Take 72 mcg by mouth daily before breakfast. As needed 06/14/19  Yes Emokpae, Courage, MD  magnesium oxide (MAG-OX) 400 MG tablet Take 400 mg by mouth daily.   Yes [provider]  methocarbamol (ROBAXIN) 500 MG tablet Take 1 tablet (500 mg total) by mouth every 8 (eight) hours as needed for muscle spasms. 04/01/20  Yes Maudie Flakes, MD  metoprolol tartrate (LOPRESSOR) 25 MG tablet Take  0.5 tablets (12.5 mg total) by mouth 2 (two) times daily. 12/04/19 03/20/21 Yes Amin, Jeanella Flattery, MD  nitroGLYCERIN (NITROSTAT) 0.4 MG SL tablet Place 1 tablet (0.4 mg total) under the tongue every 5 (five) minutes as needed for chest pain. 05/19/20  Yes Rolland Porter, MD  OXYGEN Inhale 2.5 L into the lungs daily.   Yes [provider]  rosuvastatin (CRESTOR) 10 MG tablet Take 1 tablet (10 mg total) by mouth daily. 07/06/20  Yes Freada Bergeron, MD  torsemide (DEMADEX) 20 MG tablet Take 1 tablet (20 mg total) by mouth 2 (two) times daily. For Fluid/Heart 04/22/20  Yes Roxan Hockey, MD     Critical care time: 45 minutes     CRITICAL CARE Performed by: Otilio Carpen Lesly Joslyn   Total critical care time: 45 minutes  Critical care time was exclusive of separately billable procedures and treating other patients.  Critical care was necessary to treat or prevent imminent or life-threatening deterioration.  Critical care was time spent personally by me on the following activities: development of treatment plan with patient and/or surrogate as well as nursing, discussions with consultants, evaluation of patient's response to treatment, examination of patient, obtaining history from patient or surrogate, ordering and performing treatments and interventions, ordering and review of laboratory studies, ordering and review of radiographic studies, pulse oximetry and re-evaluation of patient's condition.   Otilio Carpen Chonita Gadea, PA-C [facility]  Pager# [phone], if no answer [phone]

## 2020-08-12 DEATH — deceased

## 2020-08-13 ENCOUNTER — Encounter (HOSPITAL_COMMUNITY): Payer: Medicare Other

## 2020-08-18 ENCOUNTER — Encounter (HOSPITAL_COMMUNITY): Payer: Medicare Other

## 2020-08-20 ENCOUNTER — Encounter (HOSPITAL_COMMUNITY): Payer: Medicare Other

## 2020-08-20 ENCOUNTER — Other Ambulatory Visit (HOSPITAL_COMMUNITY): Admission: RE | Admit: 2020-08-20 | Payer: Medicare Other | Source: Ambulatory Visit

## 2020-08-24 ENCOUNTER — Ambulatory Visit (HOSPITAL_COMMUNITY): Admit: 2020-08-24 | Payer: Medicare Other | Admitting: Internal Medicine

## 2020-08-24 SURGERY — ESOPHAGOGASTRODUODENOSCOPY (EGD) WITH PROPOFOL
Anesthesia: Monitor Anesthesia Care

## 2020-08-25 ENCOUNTER — Encounter (HOSPITAL_COMMUNITY): Payer: Medicare Other

## 2020-08-27 ENCOUNTER — Encounter (HOSPITAL_COMMUNITY): Payer: Medicare Other

## 2020-09-01 ENCOUNTER — Encounter (HOSPITAL_COMMUNITY): Payer: Medicare Other

## 2020-09-03 ENCOUNTER — Encounter (HOSPITAL_COMMUNITY): Payer: Medicare Other

## 2020-09-08 ENCOUNTER — Encounter (HOSPITAL_COMMUNITY): Payer: Medicare Other

## 2020-09-10 ENCOUNTER — Encounter (HOSPITAL_COMMUNITY): Payer: Medicare Other

## 2020-09-14 ENCOUNTER — Ambulatory Visit: Payer: Medicare Other | Admitting: Cardiovascular Disease

## 2020-09-15 ENCOUNTER — Encounter (HOSPITAL_COMMUNITY): Payer: Medicare Other

## 2020-09-16 ENCOUNTER — Ambulatory Visit: Payer: Medicare Other | Admitting: Cardiology

## 2020-09-17 ENCOUNTER — Encounter (HOSPITAL_COMMUNITY): Payer: Medicare Other

## 2020-09-19 IMAGING — CR DG CHEST 1V PORT
1 series · 1 of 1 positions shown · non-contrast
Comparison: 01/29/2019

CLINICAL DATA: Shortness of breath, dizziness

EXAM:
PORTABLE CHEST 1 VIEW

[portable]
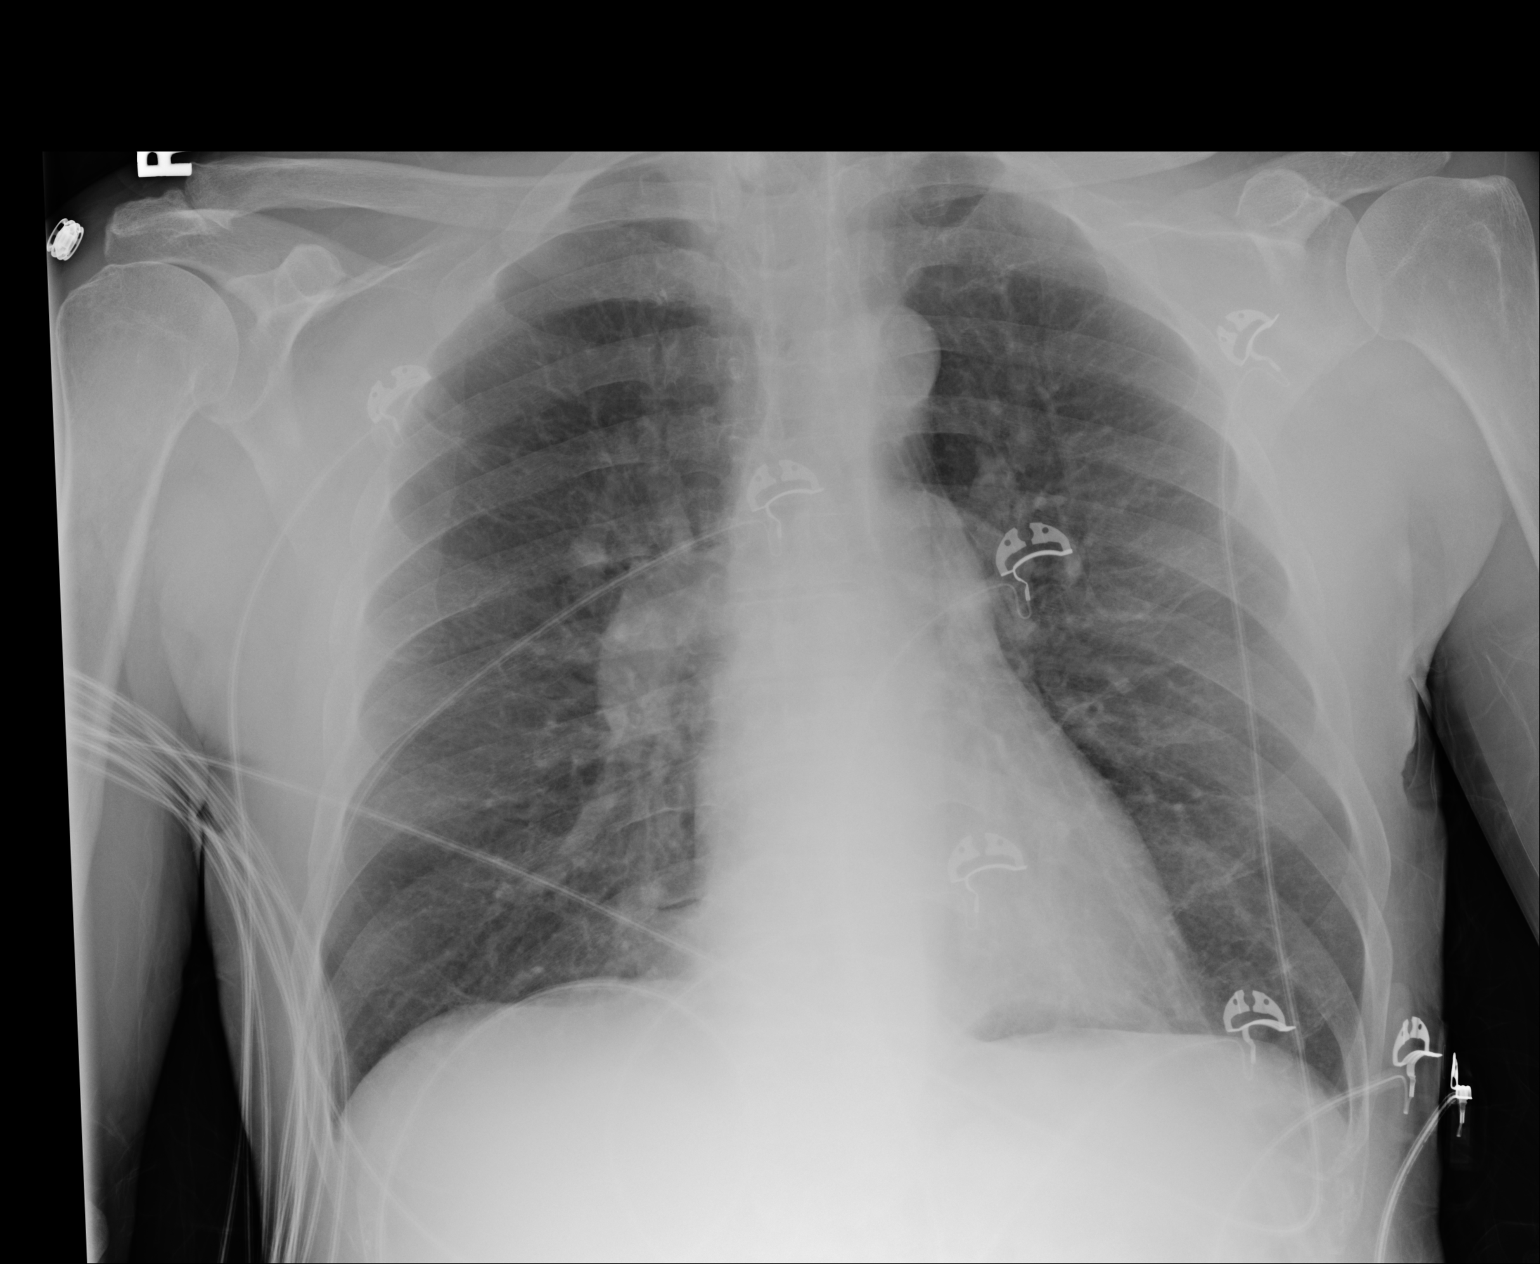

[1 of 1 positions shown; findings below may reference images not displayed]

FINDINGS: The heart size and mediastinal contours are within normal limits.
Mild, diffuse interstitial pulmonary opacity. The visualized
skeletal structures are unremarkable.
IMPRESSION: Mild, diffuse interstitial pulmonary opacity, suggesting edema or
atypical infection. No focal airspace opacity.

## 2020-09-22 ENCOUNTER — Encounter (HOSPITAL_COMMUNITY): Payer: Medicare Other

## 2020-09-24 ENCOUNTER — Encounter (HOSPITAL_COMMUNITY): Payer: Medicare Other

## 2020-09-29 ENCOUNTER — Encounter (HOSPITAL_COMMUNITY): Payer: Medicare Other

## 2020-10-01 ENCOUNTER — Encounter (HOSPITAL_COMMUNITY): Payer: Medicare Other

## 2020-10-06 ENCOUNTER — Encounter (HOSPITAL_COMMUNITY): Payer: Medicare Other

## 2020-10-08 ENCOUNTER — Encounter (HOSPITAL_COMMUNITY): Payer: Medicare Other

## 2020-10-13 ENCOUNTER — Encounter (HOSPITAL_COMMUNITY): Payer: Medicare Other

## 2020-10-15 ENCOUNTER — Encounter (HOSPITAL_COMMUNITY): Payer: Medicare Other

## 2020-10-20 ENCOUNTER — Encounter (HOSPITAL_COMMUNITY): Payer: Medicare Other

## 2020-10-22 ENCOUNTER — Encounter (HOSPITAL_COMMUNITY): Payer: Medicare Other

## 2020-10-27 ENCOUNTER — Encounter (HOSPITAL_COMMUNITY): Payer: Medicare Other

## 2020-10-29 ENCOUNTER — Encounter (HOSPITAL_COMMUNITY): Payer: Medicare Other
